# Patient Record
Sex: Female | Born: 1980
Health system: Southern US, Community
[De-identification: ages and names within clinical notes are randomized; demographics above are authoritative.]

## PROBLEM LIST (undated history)

## (undated) ENCOUNTER — Emergency Department (HOSPITAL_COMMUNITY): Admission: EM | Source: Home / Self Care

## (undated) DIAGNOSIS — R011 Cardiac murmur, unspecified: Secondary | ICD-10-CM

## (undated) DIAGNOSIS — Z01419 Encounter for gynecological examination (general) (routine) without abnormal findings: Secondary | ICD-10-CM

## (undated) DIAGNOSIS — G43909 Migraine, unspecified, not intractable, without status migrainosus: Secondary | ICD-10-CM

## (undated) DIAGNOSIS — E119 Type 2 diabetes mellitus without complications: Secondary | ICD-10-CM

## (undated) DIAGNOSIS — F909 Attention-deficit hyperactivity disorder, unspecified type: Secondary | ICD-10-CM

## (undated) DIAGNOSIS — K219 Gastro-esophageal reflux disease without esophagitis: Secondary | ICD-10-CM

## (undated) DIAGNOSIS — T7840XA Allergy, unspecified, initial encounter: Secondary | ICD-10-CM

## (undated) DIAGNOSIS — D649 Anemia, unspecified: Secondary | ICD-10-CM

## (undated) DIAGNOSIS — R0602 Shortness of breath: Secondary | ICD-10-CM

## (undated) DIAGNOSIS — F32A Depression, unspecified: Secondary | ICD-10-CM

## (undated) DIAGNOSIS — M2550 Pain in unspecified joint: Secondary | ICD-10-CM

## (undated) DIAGNOSIS — Z87442 Personal history of urinary calculi: Secondary | ICD-10-CM

## (undated) DIAGNOSIS — M549 Dorsalgia, unspecified: Secondary | ICD-10-CM

## (undated) DIAGNOSIS — K760 Fatty (change of) liver, not elsewhere classified: Secondary | ICD-10-CM

## (undated) DIAGNOSIS — R5383 Other fatigue: Secondary | ICD-10-CM

## (undated) DIAGNOSIS — F419 Anxiety disorder, unspecified: Secondary | ICD-10-CM

## (undated) DIAGNOSIS — F329 Major depressive disorder, single episode, unspecified: Secondary | ICD-10-CM

## (undated) HISTORY — DX: Migraine, unspecified, not intractable, without status migrainosus: G43.909

## (undated) HISTORY — DX: Other fatigue: R53.83

## (undated) HISTORY — DX: Encounter for gynecological examination (general) (routine) without abnormal findings: Z01.419

## (undated) HISTORY — DX: Allergy, unspecified, initial encounter: T78.40XA

## (undated) HISTORY — DX: Cardiac murmur, unspecified: R01.1

## (undated) HISTORY — DX: Attention-deficit hyperactivity disorder, unspecified type: F90.9

## (undated) HISTORY — DX: Fatty (change of) liver, not elsewhere classified: K76.0

## (undated) HISTORY — DX: Gastro-esophageal reflux disease without esophagitis: K21.9

## (undated) HISTORY — DX: Depression, unspecified: F32.A

## (undated) HISTORY — DX: Shortness of breath: R06.02

## (undated) HISTORY — DX: Anxiety disorder, unspecified: F41.9

## (undated) HISTORY — DX: Major depressive disorder, single episode, unspecified: F32.9

## (undated) HISTORY — DX: Pain in unspecified joint: M25.50

## (undated) HISTORY — DX: Dorsalgia, unspecified: M54.9

---
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
## 2001-04-14 ENCOUNTER — Other Ambulatory Visit: Admission: RE | Admit: 2001-04-14 | Discharge: 2001-04-14 | Payer: Self-pay | Admitting: Obstetrics & Gynecology

## 2002-08-29 ENCOUNTER — Other Ambulatory Visit: Admission: RE | Admit: 2002-08-29 | Discharge: 2002-08-29 | Payer: Self-pay | Admitting: Obstetrics & Gynecology

## 2003-08-30 ENCOUNTER — Other Ambulatory Visit: Admission: RE | Admit: 2003-08-30 | Discharge: 2003-08-30 | Payer: Self-pay | Admitting: Obstetrics & Gynecology

## 2004-07-13 ENCOUNTER — Other Ambulatory Visit: Admission: RE | Admit: 2004-07-13 | Discharge: 2004-07-13 | Payer: Self-pay | Admitting: Obstetrics & Gynecology

## 2004-07-14 ENCOUNTER — Other Ambulatory Visit: Admission: RE | Admit: 2004-07-14 | Discharge: 2004-07-14 | Payer: Self-pay | Admitting: Obstetrics & Gynecology

## 2005-01-13 ENCOUNTER — Inpatient Hospital Stay (HOSPITAL_COMMUNITY): Admission: AD | Admit: 2005-01-13 | Discharge: 2005-01-13 | Payer: Self-pay | Admitting: Obstetrics and Gynecology

## 2005-01-14 ENCOUNTER — Inpatient Hospital Stay (HOSPITAL_COMMUNITY): Admission: AD | Admit: 2005-01-14 | Discharge: 2005-01-14 | Payer: Self-pay | Admitting: Obstetrics & Gynecology

## 2005-03-02 ENCOUNTER — Inpatient Hospital Stay (HOSPITAL_COMMUNITY): Admission: AD | Admit: 2005-03-02 | Discharge: 2005-03-06 | Payer: Self-pay | Admitting: Obstetrics & Gynecology

## 2005-11-24 ENCOUNTER — Emergency Department (HOSPITAL_COMMUNITY): Admission: EM | Admit: 2005-11-24 | Discharge: 2005-11-24 | Payer: Self-pay | Admitting: Family Medicine

## 2006-05-24 ENCOUNTER — Inpatient Hospital Stay (HOSPITAL_COMMUNITY): Admission: AD | Admit: 2006-05-24 | Discharge: 2006-05-24 | Payer: Self-pay | Admitting: Obstetrics and Gynecology

## 2006-05-27 ENCOUNTER — Inpatient Hospital Stay (HOSPITAL_COMMUNITY): Admission: AD | Admit: 2006-05-27 | Discharge: 2006-05-27 | Payer: Self-pay | Admitting: Obstetrics and Gynecology

## 2006-06-28 ENCOUNTER — Ambulatory Visit (HOSPITAL_COMMUNITY): Admission: RE | Admit: 2006-06-28 | Discharge: 2006-06-28 | Payer: Self-pay | Admitting: Obstetrics & Gynecology

## 2006-10-25 ENCOUNTER — Encounter: Admission: RE | Admit: 2006-10-25 | Discharge: 2006-10-25 | Payer: Self-pay | Admitting: Obstetrics and Gynecology

## 2006-11-24 ENCOUNTER — Inpatient Hospital Stay (HOSPITAL_COMMUNITY): Admission: AD | Admit: 2006-11-24 | Discharge: 2006-11-24 | Payer: Self-pay | Admitting: Obstetrics and Gynecology

## 2006-12-13 ENCOUNTER — Inpatient Hospital Stay (HOSPITAL_COMMUNITY): Admission: RE | Admit: 2006-12-13 | Discharge: 2006-12-16 | Payer: Self-pay | Admitting: Obstetrics and Gynecology

## 2006-12-13 ENCOUNTER — Encounter (INDEPENDENT_AMBULATORY_CARE_PROVIDER_SITE_OTHER): Payer: Self-pay | Admitting: Obstetrics and Gynecology

## 2008-04-26 ENCOUNTER — Emergency Department: Payer: Self-pay | Admitting: Emergency Medicine

## 2010-08-25 NOTE — Op Note (Signed)
NAMEALICIANNA, LITCHFORD             ACCOUNT NO.:  [id number]   MEDICAL RECORD NO.:  [id number]          PATIENT TYPE:  INP   LOCATION:  9122                          FACILITY:  [facility]   PHYSICIAN:  Lenard Galloway, M.D.   DATE OF BIRTH:  04-Jan-1981   DATE OF PROCEDURE:  DATE OF DISCHARGE:                               OPERATIVE REPORT   PREOPERATIVE DIAGNOSIS:  1. Intrauterine gestation at 38 +5 weeks.  2. Class A2 gestational diabetes mellitus.  3. Suspected fetal macrosomia.   POSTOPERATIVE DIAGNOSIS:  1. Intrauterine gestation at 38 +5 weeks.  2. Class A2 gestational diabetes mellitus.  3. Fetal macrosomia.   PROCEDURE:  Primary low segment transverse cesarean section.   SURGEON:  Josefa Half, MD   ASSISTANT:  Gus Height, MD   IV FLUIDS:  Twenty-nine hundred mL Ringer lactate.   ESTIMATED BLOOD LOSS:  One thousand mL.   URINE OUTPUT:  Three hundred twenty-five mL.   COMPLICATIONS:  None.   INDICATIONS FOR PROCEDURE:  The patient is a 30 year old gravida 2, para  1-0-0-1 Caucasian female at 80 +5 weeks' gestation by first trimester  ultrasound who has a diagnosis of gestational diabetes which is  controlled with glyburide.  The patient clinically is noted to have a  large fetus on ultrasound performed on August 28, documented an  estimated fetal weight of 3972 grams which is greater than the 97th  percentile.  The amniotic fluid index was 19.37.  The patient has been  having twice weekly fetal testing which has been reassuring.  A plan is  made now to proceed with a primary low segment transverse cesarean  section after risks, benefits, and alternatives are reviewed.   FINDINGS:  A viable female was delivered at 7:49 a.m. with Apgars of 8  at one minute and 9 at five minutes.  The weight was 10 pounds 6 ounces.  The amniotic fluid was clear.  The placenta had a normal insertion of a  three-vessel cord.  The uterus, tubes, and ovaries were unremarkable.   SPECIMENS:  The  placenta was sent to pathology.   PROCEDURE:  The patient was reidentified in the preoperative hold area.  She did receive clindamycin 900 mg IV.  The patient was brought to the  operating room where a spinal anesthetic was administered.  The patient  was placed in the supine position with a left lateral tilt.  The abdomen  was sterilely prepped, and a Foley catheter was sterilely placed inside  the bladder.  She was sterilely draped.   The procedure began with a Pfannenstiel incision which was created  sharply with a scalpel.  This was carried down to the fascia using  monopolar cautery for hemostasis.  The fascia was incised in the  midline, and the incision was extended bilaterally with the Mayo  scissors.  The rectus muscles were dissected off of the fascia  superiorly and inferiorly.  The rectus muscles were then sharply divided  in the midline.  The parietal peritoneum was elevated with hemostat  clamps and was entered sharply.  The peritoneal incision was extended  cranially  and caudally with the Metzenbaum scissors.   The lower uterine segment was exposed with the bladder retractor, and a  bladder flap was sharply created.  A transverse lower uterine segment  incision was then created sharply with the scalpel.  The uterine  incision was extended bluntly.  Membranes were ruptured with an Allis  clamp.  Initially, an arm spontaneously delivered through the incision.  The arm was tucked back inside the uterine cavity, and the fetal vertex  was then elevated and delivered without difficulty followed by the  remainder of the infant.  The nares and mouth were suctioned with the  bulb syringe.  The cord was doubly clamped and cut.  The newborn was  carried over to the waiting pediatricians in vigorous condition.   Cord blood was obtained, and the placenta was then manually extracted  and sent to pathology.  The uterus was exteriorized at this time.  It  was wiped clean with a  moistened lap pad.  It was closed with a double  layer closure of No. 1 chromic.  The first was a running locked layer,  and the second was an imbricating layer.   The uterus was then returned to the peritoneal cavity which was  irrigated and suctioned.  The uterine incision was noted to be  hemostatic.   The abdomen was closed.  The parietal peritoneum was closed with a  running suture of 3-0 Vicryl.  The rectus muscles were re-approximated  in the midline with interrupted sutures of No. 1 chromic.  The fascia  was closed with a running suture of 0 Vicryl.  The subcutaneous layer  was irrigated and suctioned and made hemostatic with monopolar cautery.  The skin was closed with staples, and a pressure bandage was placed over  this.   This concluded the patient's procedure.  There were no complications.  All needle, instrument, sponge counts were correct.      Lenard Galloway, M.D.  Electronically Signed     BES/MEDQ  D:  12/13/2006  T:  12/13/2006  Job:  [id number]

## 2010-08-25 NOTE — Discharge Summary (Signed)
Laurie Sutton, Laurie Sutton             ACCOUNT NO.:  [id number]   MEDICAL RECORD NO.:  [id number]          PATIENT TYPE:  INP   LOCATION:  9122                          FACILITY:  [facility]   PHYSICIAN:  Cristopher Estimable. Stann Mainland, M.D.   DATE OF BIRTH:  May 10, 1980   DATE OF ADMISSION:  12/13/2006  DATE OF DISCHARGE:  12/16/2006                               DISCHARGE SUMMARY   DISCHARGE DIAGNOSIS:  1. Term pregnancy delivered 10 pound 6 ounce female infant, Apgars 8      and 9.  2. Blood type O+.  3. Suspected macrosomia.  4. Gestational diabetes mellitus.   PROCEDURE:  Primary low-transverse cesarean section.   SUMMARY:  This 30 year old gravida 2, now para 2, was admitted at 63-[redacted]  weeks gestation for primary low-transverse cesarean section because of  suspected macrosomia.  She had gestational diabetes during pregnancy,  managed with oral agents.  As she neared-term macrosomia was suspected;  and after discussion with the patient decision was made to proceed with  primary low-transverse cesarean section for delivery which was carried  out on September 2 with delivery of a 10 pound 6 ounce female infant  with good Apgars.   The patient's postpartum course was complicated by some slight blood  pressure elevations which were controlled with Procardia 30 mg XL daily  which the patient also went home on.  On the morning of September 5 she  was ambulating well, tolerating a regular diet well, having normal bowel  and bladder function; and was afebrile.  Her wound was clean and dry.  Her fundus was firm; bleeding was minimal; and she was deemed ready for  discharge.  Accordingly, her staples were removed and her wound was  Steri-Stripped with Benzoin.  She was given all appropriate  instructions, and discharged to home.  Discharge hemoglobin on September  3 was 11.6 with a white count of 10,700 and a platelet count of 251,000.   MEDICATIONS AT THE TIME OF DISCHARGE INCLUDE:  1. Vitamins one a day as  long she is breast-feeding.  2. Procardia at 30 mg XL daily for blood pressure.  3. Motrin 600 mg every 6 hours as needed for cramping or pain.  4. Tylox one to two q.4-6 h. as needed for more severe pain.   She will return the office for followup in approximately four weeks'  time or as needed.   CONDITION ON DISCHARGE:  Improved.      Cristopher Estimable. Stann Mainland, M.D.  Electronically Signed    RMW/MEDQ  D:  12/16/2006  T:  12/16/2006  Job:  [id number]

## 2011-01-22 LAB — CBC
Hemoglobin: 13.2
MCHC: 35.6
MCV: 90.5
MCV: 89.8
Platelets: 251
Platelets: 257
RBC: 4.1
RDW: 13.7
RDW: 13.9
WBC: 8.6

## 2011-01-22 LAB — BASIC METABOLIC PANEL
BUN: 3 — ABNORMAL LOW
Calcium: 8.6
Creatinine, Ser: 0.5
GFR calc Af Amer: >60
Glucose, Bld: 228 — ABNORMAL HIGH
Sodium: 133 — ABNORMAL LOW

## 2011-01-22 LAB — COMPREHENSIVE METABOLIC PANEL
AST: 20
Alkaline Phosphatase: 99
GFR calc Af Amer: >60
GFR calc non Af Amer: >60
Total Bilirubin: 0.4

## 2011-01-22 LAB — URIC ACID: Uric Acid, Serum: 4.9

## 2011-01-22 LAB — RPR: RPR Ser Ql: NONREACTIVE

## 2011-02-16 ENCOUNTER — Ambulatory Visit (INDEPENDENT_AMBULATORY_CARE_PROVIDER_SITE_OTHER): Payer: BC Managed Care – PPO | Admitting: Family Medicine

## 2011-02-16 ENCOUNTER — Encounter: Payer: Self-pay | Admitting: Family Medicine

## 2011-02-16 VITALS — BP 124/80 | HR 74 | Temp 98.2°F | Ht 67.0 in | Wt 216.0 lb

## 2011-02-16 DIAGNOSIS — F341 Dysthymic disorder: Secondary | ICD-10-CM

## 2011-02-16 DIAGNOSIS — F32A Depression, unspecified: Secondary | ICD-10-CM | POA: Insufficient documentation

## 2011-02-16 DIAGNOSIS — F418 Other specified anxiety disorders: Secondary | ICD-10-CM

## 2011-02-16 DIAGNOSIS — G43909 Migraine, unspecified, not intractable, without status migrainosus: Secondary | ICD-10-CM | POA: Insufficient documentation

## 2011-02-16 MED ORDER — FLUOXETINE HCL 40 MG PO CAPS: 40.0000 mg | ORAL_CAPSULE | Freq: Every day | ORAL | Status: DC

## 2011-02-16 MED ORDER — SUMATRIPTAN SUCCINATE 100 MG PO TABS: 100.0000 mg | ORAL_TABLET | Freq: Once | ORAL | Status: DC | PRN

## 2011-02-16 MED ORDER — ALPRAZOLAM 1 MG PO TABS: 1.0000 mg | ORAL_TABLET | Freq: Every evening | ORAL | Status: DC | PRN

## 2011-02-16 NOTE — Progress Notes (Signed)
  Subjective:    Patient ID: Laurie Sutton, female    DOB: 06-10-1980, 30 y.o.   MRN: [id number]  HPI 30 yr old female to establish with [name] after seeing Urgent Care for a number of years. Her main issues to discuss today are her headaches and her depression with anxiety. She started having migraines as a teenager, with the typical throbbing HAs, nausea, and light sensitivity. These went away in her 27s, but now they have returned in the past 6 months. She is averaging 3-4 HAs a week. She usually take some Excedrin and goes to bed for a few hours. She drinks 4-5 caffeinated sodas every day. She is a homemaker, and she she has 2 kids ages 18 and 62 years. She is under a lot of stress as well. She has tried Paxil and Wellbutrin in the past with poor results. She has constant anxiety, worries about things, has a shoert temper, feels sad and hopeless, and she has trouble sleeping.    Review of Systems  Constitutional: Negative.   Respiratory: Negative.   Cardiovascular: Negative.   Neurological: Positive for headaches.  Psychiatric/Behavioral: Positive for sleep disturbance, dysphoric mood and decreased concentration. The patient is nervous/anxious.        Objective:   Physical Exam  Constitutional: She appears well-developed and well-nourished.  Neck: No thyromegaly present.  Cardiovascular: Normal rate, regular rhythm, normal heart sounds and intact distal pulses.   Pulmonary/Chest: Effort normal and breath sounds normal. No respiratory distress. She has no wheezes. She has no rales. She exhibits no tenderness.  Lymphadenopathy:    She has no cervical adenopathy.  Psychiatric: She has a normal mood and affect. Her behavior is normal. Thought content normal.          Assessment & Plan:  For the depression, try Prozac. For the insomnia, try Xanax at bedtime. For the migraines, try Sumatriptan, and I advised her to cut back dramatically on her caffeine use.

## 2011-03-01 ENCOUNTER — Ambulatory Visit (INDEPENDENT_AMBULATORY_CARE_PROVIDER_SITE_OTHER): Payer: BC Managed Care – PPO | Admitting: Family Medicine

## 2011-03-01 ENCOUNTER — Encounter: Payer: Self-pay | Admitting: Family Medicine

## 2011-03-01 VITALS — BP 124/80 | HR 87 | Temp 98.4°F | Wt 218.0 lb

## 2011-03-01 DIAGNOSIS — G43909 Migraine, unspecified, not intractable, without status migrainosus: Secondary | ICD-10-CM

## 2011-03-01 DIAGNOSIS — G4700 Insomnia, unspecified: Secondary | ICD-10-CM

## 2011-03-01 DIAGNOSIS — F341 Dysthymic disorder: Secondary | ICD-10-CM

## 2011-03-01 DIAGNOSIS — F418 Other specified anxiety disorders: Secondary | ICD-10-CM

## 2011-03-01 MED ORDER — TEMAZEPAM 30 MG PO CAPS: 30.0000 mg | ORAL_CAPSULE | Freq: Every evening | ORAL | Status: DC | PRN

## 2011-03-01 MED ORDER — VENLAFAXINE HCL 75 MG PO TABS: 75.0000 mg | ORAL_TABLET | Freq: Two times a day (BID) | ORAL | Status: DC

## 2011-03-01 MED ORDER — ALPRAZOLAM 1 MG PO TABS: 1.0000 mg | ORAL_TABLET | Freq: Three times a day (TID) | ORAL | Status: AC | PRN

## 2011-03-01 NOTE — Progress Notes (Signed)
  Subjective:    Patient ID: Laurie Sutton, female    DOB: Sep 27, 1980, 30 y.o.   MRN: [id number]  HPI Here to follow up on depression with anxiety, insomnia, and migraines. At our last visit she started on Prozac, Xanax, and Sumatriptan. She did not like how the Prozac made her feel, so she went back on Effexor. The Sumatriptan definitely helps the HAs. The Xanax helps her relax during the day, but it does not help with sleep at all.    Review of Systems  Constitutional: Negative.   Neurological: Positive for headaches.  Psychiatric/Behavioral: Positive for sleep disturbance. The patient is nervous/anxious.        Objective:   Physical Exam  Constitutional: She is oriented to person, place, and time. She appears well-developed and well-nourished.  Neurological: She is alert and oriented to person, place, and time. No cranial nerve deficit.  Psychiatric: She has a normal mood and affect. Her behavior is normal. Thought content normal.          Assessment & Plan:  Stay on Effexor but increase the dose to 75 mg bid. Use Xanax for anxiety, but try Temazepam at bedtime for sleep.

## 2011-03-12 ENCOUNTER — Other Ambulatory Visit: Payer: Self-pay | Admitting: Family Medicine

## 2011-03-12 MED ORDER — SUMATRIPTAN SUCCINATE 100 MG PO TABS: 100.0000 mg | ORAL_TABLET | Freq: Once | ORAL | Status: DC | PRN

## 2011-03-12 NOTE — Telephone Encounter (Signed)
Pt is requesting more generic imitrex med call into [facility] [address]. Pt was given 9 pills on 02-16-11.

## 2011-03-12 NOTE — Telephone Encounter (Signed)
rx called into pharmacy

## 2011-03-12 NOTE — Telephone Encounter (Signed)
Call in #12 with 11 rf

## 2011-03-16 ENCOUNTER — Telehealth: Payer: Self-pay | Admitting: Family Medicine

## 2011-03-16 NOTE — Telephone Encounter (Signed)
Wants a quantity increase of her migraine meds. Please advise. Call her at [phone].        [facility]

## 2011-03-17 NOTE — Telephone Encounter (Signed)
I assume she means the Imitrex. This is usually limited by her insurance company. She should contact them to see how much they will approve per month

## 2011-03-19 NOTE — Telephone Encounter (Signed)
Spoke with pt

## 2011-05-07 ENCOUNTER — Encounter: Payer: Self-pay | Admitting: Family Medicine

## 2011-05-07 ENCOUNTER — Ambulatory Visit (INDEPENDENT_AMBULATORY_CARE_PROVIDER_SITE_OTHER): Payer: BC Managed Care – PPO | Admitting: Family Medicine

## 2011-05-07 VITALS — BP 116/74 | HR 83 | Temp 98.5°F | Wt 213.0 lb

## 2011-05-07 DIAGNOSIS — J329 Chronic sinusitis, unspecified: Secondary | ICD-10-CM

## 2011-05-07 MED ORDER — AZITHROMYCIN 250 MG PO TABS: ORAL_TABLET | ORAL | Status: DC

## 2011-05-07 NOTE — Progress Notes (Signed)
  Subjective:    Patient ID: Laurie Sutton, female    DOB: January 20, 1981, 31 y.o.   MRN: [id number]  HPI Here for 3 days of sinus pressure, HA, ST, and a dry cough. No fever. On Mucinex and Advil.    Review of Systems  Constitutional: Negative.   HENT: Positive for congestion, postnasal drip and sinus pressure.   Eyes: Negative.   Respiratory: Positive for cough.        Objective:   Physical Exam  Constitutional: She appears well-developed and well-nourished.  HENT:  Right Ear: External ear normal.  Left Ear: External ear normal.  Nose: Nose normal.  Mouth/Throat: Oropharynx is clear and moist. No oropharyngeal exudate.  Eyes: Conjunctivae are normal.  Pulmonary/Chest: Effort normal and breath sounds normal.  Lymphadenopathy:    She has no cervical adenopathy.          Assessment & Plan:  Recheck prn

## 2011-05-12 ENCOUNTER — Encounter: Payer: Self-pay | Admitting: Family Medicine

## 2011-05-12 ENCOUNTER — Ambulatory Visit (INDEPENDENT_AMBULATORY_CARE_PROVIDER_SITE_OTHER): Payer: BC Managed Care – PPO | Admitting: Family Medicine

## 2011-05-12 VITALS — BP 116/70 | HR 110 | Temp 98.6°F | Wt 213.0 lb

## 2011-05-12 DIAGNOSIS — J329 Chronic sinusitis, unspecified: Secondary | ICD-10-CM

## 2011-05-12 MED ORDER — METHYLPREDNISOLONE ACETATE 80 MG/ML IJ SUSP
120.0000 mg | Freq: Once | INTRAMUSCULAR | Status: AC
  Administered 2011-05-12: 120 mg via INTRAMUSCULAR

## 2011-05-12 MED ORDER — LEVOFLOXACIN 500 MG PO TABS: 500.0000 mg | ORAL_TABLET | Freq: Every day | ORAL | Status: AC

## 2011-05-12 NOTE — Progress Notes (Signed)
Addended by: Aggie Hacker A on: 05/12/2011 12:16 PM   Modules accepted: Orders

## 2011-05-12 NOTE — Progress Notes (Signed)
  Subjective:    Patient ID: Laurie Sutton, female    DOB: [birth year], 31 y.o.   MRN: [id number]  HPI Here for worsening pressure and pain in the right cheek and forehead, tearing in the right eye, ST, and dry cough. She just finished her Zpack yesterday, but she is not improving. Using Mucinex and Sudafed.    Review of Systems  Constitutional: Negative.   HENT: Positive for congestion, rhinorrhea and postnasal drip.   Eyes: Negative.   Respiratory: Positive for cough.        Objective:   Physical Exam  Constitutional: She appears well-developed and well-nourished.  HENT:  Right Ear: External ear normal.  Left Ear: External ear normal.  Nose: Nose normal.  Mouth/Throat: Oropharynx is clear and moist. No oropharyngeal exudate.  Eyes: Conjunctivae are normal.  Neck: Neck supple.  Pulmonary/Chest: Effort normal and breath sounds normal. No respiratory distress. She has no wheezes. She has no rales.  Lymphadenopathy:    She has no cervical adenopathy.          Assessment & Plan:  Recheck prn

## 2011-07-07 ENCOUNTER — Telehealth: Payer: Self-pay | Admitting: Family Medicine

## 2011-07-07 NOTE — Telephone Encounter (Signed)
Pt called and is having lower abdominal pain near genital area. Burning and stinging when urinating. Also having diarrhea. Pt is not running a fever. Pt is req work in ov with Dr Sarajane Jews asap.

## 2011-07-07 NOTE — Telephone Encounter (Signed)
Appt scheduled tomorrow with Dr. Sarajane Jews.

## 2011-07-08 ENCOUNTER — Encounter: Payer: Self-pay | Admitting: Family Medicine

## 2011-07-08 ENCOUNTER — Ambulatory Visit (INDEPENDENT_AMBULATORY_CARE_PROVIDER_SITE_OTHER): Payer: BC Managed Care – PPO | Admitting: Family Medicine

## 2011-07-08 VITALS — BP 104/66 | HR 88 | Temp 97.9°F | Wt 209.0 lb

## 2011-07-08 DIAGNOSIS — G43909 Migraine, unspecified, not intractable, without status migrainosus: Secondary | ICD-10-CM

## 2011-07-08 DIAGNOSIS — G43109 Migraine with aura, not intractable, without status migrainosus: Secondary | ICD-10-CM

## 2011-07-08 DIAGNOSIS — R300 Dysuria: Secondary | ICD-10-CM

## 2011-07-08 DIAGNOSIS — N390 Urinary tract infection, site not specified: Secondary | ICD-10-CM

## 2011-07-08 LAB — POCT URINALYSIS DIPSTICK
Nitrite, UA: NEGATIVE
Urobilinogen, UA: 0.2

## 2011-07-08 MED ORDER — TOPIRAMATE 25 MG PO TABS: 25.0000 mg | ORAL_TABLET | Freq: Every day | ORAL | Status: DC

## 2011-07-08 MED ORDER — CIPROFLOXACIN HCL 500 MG PO TABS: 500.0000 mg | ORAL_TABLET | Freq: Two times a day (BID) | ORAL | Status: AC

## 2011-07-08 NOTE — Progress Notes (Signed)
  Subjective:    Patient ID: Laurie Sutton, female    DOB: 13-Dec-1980, 31 y.o.   MRN: [id number]  HPI Here for 2 reasons. First she has had urinary frequency and burning for 3 days. No nausea or fever. Also her migraines have become much more frequent. Lately she gets them about 5 days out of 7. Sometimes Imitrex relieves and sometimes not. They are still centered around her forehead. She has an eye exam coming up next week.    Review of Systems  Constitutional: Negative.   Genitourinary: Positive for dysuria, urgency and frequency. Negative for hematuria.  Neurological: Positive for headaches.       Objective:   Physical Exam  Constitutional: She is oriented to person, place, and time. She appears well-developed and well-nourished.  Abdominal: Soft. Bowel sounds are normal. She exhibits no distension and no mass. There is no tenderness. There is no rebound and no guarding.  Neurological: She is alert and oriented to person, place, and time. No cranial nerve deficit.          Assessment & Plan:  We will try a preventive strategy for her HAs by taking Topamax daily. She will get back with [name] about this in 2 weeks. Drink water. Use Cipro.

## 2011-07-16 ENCOUNTER — Ambulatory Visit: Payer: BC Managed Care – PPO | Admitting: Family

## 2011-07-19 ENCOUNTER — Encounter: Payer: Self-pay | Admitting: Family Medicine

## 2011-07-19 ENCOUNTER — Ambulatory Visit: Payer: BC Managed Care – PPO | Admitting: Family Medicine

## 2011-07-19 ENCOUNTER — Ambulatory Visit (INDEPENDENT_AMBULATORY_CARE_PROVIDER_SITE_OTHER): Payer: BC Managed Care – PPO | Admitting: Family Medicine

## 2011-07-19 VITALS — BP 112/76 | HR 87 | Temp 97.2°F | Wt 212.0 lb

## 2011-07-19 DIAGNOSIS — G43909 Migraine, unspecified, not intractable, without status migrainosus: Secondary | ICD-10-CM

## 2011-07-19 DIAGNOSIS — N390 Urinary tract infection, site not specified: Secondary | ICD-10-CM

## 2011-07-19 MED ORDER — NITROFURANTOIN MONOHYD MACRO 100 MG PO CAPS: 100.0000 mg | ORAL_CAPSULE | Freq: Two times a day (BID) | ORAL | Status: AC

## 2011-07-19 MED ORDER — TOPIRAMATE 50 MG PO TABS: 50.0000 mg | ORAL_TABLET | Freq: Every day | ORAL | Status: DC

## 2011-07-19 NOTE — Progress Notes (Signed)
  Subjective:    Patient ID: Laurie Sutton, female    DOB: [birth year], 31 y.o.   MRN: [id number]  HPI Here for recurrent urinary burning and urgency, and for migraines. She was here several weeks ago and was given Cipro. This seemed to work but her urinary symptoms returned a few days ago. No fever. Also she started on Topamax 25 mg daily, and this helped her HAs quite a bit for a week or so. Now they are coming back.    Review of Systems  Constitutional: Negative.   Gastrointestinal: Negative.   Genitourinary: Positive for dysuria, urgency and frequency. Negative for flank pain and difficulty urinating.  Neurological: Positive for headaches.       Objective:   Physical Exam  Constitutional: She appears well-developed and well-nourished.  Abdominal: Soft. Bowel sounds are normal. She exhibits no distension and no mass. There is no tenderness. There is no rebound and no guarding.          Assessment & Plan:  Try Macrobid for 7 days. We will culture the urine. Increase Topamax to 50 mg a day.

## 2011-07-21 LAB — URINE CULTURE: Colony Count: 9000

## 2011-07-22 NOTE — Progress Notes (Signed)
Quick Note:  Left voice message ______

## 2011-07-27 ENCOUNTER — Telehealth: Payer: Self-pay | Admitting: Family Medicine

## 2011-07-27 NOTE — Telephone Encounter (Signed)
Pt called and said that the abxs that she has been on, has given her a yeast inf, so pt is req med to help with the yeast inf. [facility] [address]

## 2011-07-28 ENCOUNTER — Telehealth: Payer: Self-pay | Admitting: Family Medicine

## 2011-07-28 MED ORDER — FLUCONAZOLE 150 MG PO TABS: 150.0000 mg | ORAL_TABLET | Freq: Once | ORAL | Status: AC

## 2011-07-28 NOTE — Telephone Encounter (Signed)
Call in Diflucan 150 mg prn, #1 with 5 rf

## 2011-07-28 NOTE — Telephone Encounter (Signed)
Pt left a voice message and is requesting something for a possible yeast infection? She has been on a couple of antibiotics and now having some vaginal itching.

## 2011-07-28 NOTE — Telephone Encounter (Signed)
Script sent e-scribe and spoke with pt.

## 2011-09-09 ENCOUNTER — Telehealth: Payer: Self-pay | Admitting: Family Medicine

## 2011-09-09 NOTE — Telephone Encounter (Signed)
Pt requesting to up dose on topiramate (TOPAMAX) 50 MG tablet Pt stating she is still having bad headaches. Please contact

## 2011-09-10 MED ORDER — TOPIRAMATE 50 MG PO TABS: 100.0000 mg | ORAL_TABLET | Freq: Every day | ORAL | Status: DC

## 2011-09-10 NOTE — Telephone Encounter (Signed)
Left a message for patient to return call. RX called into pharmacy

## 2011-09-10 NOTE — Telephone Encounter (Signed)
Increase the dose to 100 mg a day. Call in a 6 month supply

## 2011-09-15 ENCOUNTER — Encounter: Payer: Self-pay | Admitting: Family Medicine

## 2011-09-15 ENCOUNTER — Ambulatory Visit (INDEPENDENT_AMBULATORY_CARE_PROVIDER_SITE_OTHER): Payer: BC Managed Care – PPO | Admitting: Family Medicine

## 2011-09-15 VITALS — BP 110/70 | HR 93 | Temp 97.6°F | Wt 206.0 lb

## 2011-09-15 DIAGNOSIS — Z9109 Other allergy status, other than to drugs and biological substances: Secondary | ICD-10-CM

## 2011-09-15 DIAGNOSIS — R51 Headache: Secondary | ICD-10-CM

## 2011-09-15 DIAGNOSIS — J309 Allergic rhinitis, unspecified: Secondary | ICD-10-CM

## 2011-09-15 MED ORDER — METHYLPREDNISOLONE ACETATE 80 MG/ML IJ SUSP
120.0000 mg | Freq: Once | INTRAMUSCULAR | Status: AC
  Administered 2011-09-15: 120 mg via INTRAMUSCULAR

## 2011-09-15 NOTE — Progress Notes (Signed)
Addended by: Aggie Hacker A on: 09/15/2011 05:10 PM   Modules accepted: Orders

## 2011-09-15 NOTE — Progress Notes (Signed)
  Subjective:    Patient ID: Laurie Sutton, female    DOB: 1980/05/03, 31 y.o.   MRN: [id number]  HPI Here for 3 days of sinus pressure, ear pressure, HAs, and sneezing. No fever or ST or cough. She has taken Imitrex with only partial relief from the HA. On Claritin.    Review of Systems  Constitutional: Negative.   HENT: Positive for congestion, postnasal drip and sinus pressure.   Eyes: Negative.   Respiratory: Negative.   Neurological: Positive for headaches.       Objective:   Physical Exam  Constitutional: She appears well-developed and well-nourished.  HENT:  Right Ear: External ear normal.  Left Ear: External ear normal.  Nose: Nose normal.  Mouth/Throat: Oropharynx is clear and moist.  Eyes: Conjunctivae are normal. Pupils are equal, round, and reactive to light.  Neck: No thyromegaly present.  Pulmonary/Chest: Effort normal and breath sounds normal.  Lymphadenopathy:    She has no cervical adenopathy.          Assessment & Plan:  She has some sinus congestion from allergies that has triggered migraines. Try Mucinex D prn. Given a steroid shot.

## 2011-10-20 ENCOUNTER — Encounter: Payer: Self-pay | Admitting: Family Medicine

## 2011-10-20 ENCOUNTER — Ambulatory Visit (INDEPENDENT_AMBULATORY_CARE_PROVIDER_SITE_OTHER): Payer: BC Managed Care – PPO | Admitting: Family Medicine

## 2011-10-20 VITALS — BP 118/70 | HR 100 | Temp 98.4°F | Wt 213.0 lb

## 2011-10-20 DIAGNOSIS — R51 Headache: Secondary | ICD-10-CM

## 2011-10-20 DIAGNOSIS — N390 Urinary tract infection, site not specified: Secondary | ICD-10-CM

## 2011-10-20 LAB — POCT URINALYSIS DIPSTICK
Bilirubin, UA: NEGATIVE
Blood, UA: NEGATIVE
Nitrite, UA: NEGATIVE
pH, UA: 6.5

## 2011-10-20 MED ORDER — CIPROFLOXACIN HCL 500 MG PO TABS: 500.0000 mg | ORAL_TABLET | Freq: Two times a day (BID) | ORAL | Status: AC

## 2011-10-20 NOTE — Addendum Note (Signed)
Addended by: Aggie Hacker A on: 10/20/2011 03:41 PM   Modules accepted: Orders

## 2011-10-20 NOTE — Progress Notes (Signed)
  Subjective:    Patient ID: Laurie Sutton, female    DOB: 01-11-1981, 31 y.o.   MRN: [id number]  HPI Here to follow up on HAs and a UTI. We saw her 5 weeks ago for her HAs and gave her a steroid shot. This did not help at all. She still has daily HAs, mostly around the eyes or the top  of the head. No vomiting. She has never been imaged as yet. Also she saw Urgent Care on 10-10-11 for a UTI and was given Bactrim DS and Pyridium. She took this for 6 days and stopped because she had no improvement. She still has urgency and burning. No fever.    Review of Systems  Constitutional: Negative.   Genitourinary: Positive for dysuria, urgency and frequency.  Neurological: Positive for headaches.       Objective:   Physical Exam  Constitutional: She is oriented to person, place, and time. She appears well-developed and well-nourished.  HENT:  Head: Normocephalic and atraumatic.  Right Ear: External ear normal.  Left Ear: External ear normal.  Nose: Nose normal.  Mouth/Throat: Oropharynx is clear and moist.  Eyes: Conjunctivae and EOM are normal. Pupils are equal, round, and reactive to light.  Neck: Neck supple. No thyromegaly present.  Abdominal: Soft. Bowel sounds are normal. She exhibits no distension and no mass. There is no tenderness. There is no rebound and no guarding.  Lymphadenopathy:    She has no cervical adenopathy.  Neurological: She is alert and oriented to person, place, and time. No cranial nerve deficit.          Assessment & Plan:  Treat the UTI with Cipro, and we will culture the urine. Get a head CT to investigate the HAs.

## 2011-10-21 ENCOUNTER — Ambulatory Visit (INDEPENDENT_AMBULATORY_CARE_PROVIDER_SITE_OTHER)
Admission: RE | Admit: 2011-10-21 | Discharge: 2011-10-21 | Disposition: A | Discharge status: Home / Self Care | Payer: BC Managed Care – PPO | Source: Ambulatory Visit | Attending: Family Medicine | Admitting: Family Medicine

## 2011-10-21 DIAGNOSIS — R51 Headache: Secondary | ICD-10-CM

## 2011-10-22 LAB — URINE CULTURE: Colony Count: >=100000

## 2011-10-22 NOTE — Progress Notes (Signed)
Quick Note:  Pt wants to know what the next step is, since the results did come back normal. ______

## 2011-10-22 NOTE — Progress Notes (Signed)
Quick Note:  I spoke with pt and gave results. ______

## 2011-10-24 NOTE — Addendum Note (Signed)
Addended by: Alysia Penna A on: 10/24/2011 10:51 PM   Modules accepted: Orders

## 2011-10-25 NOTE — Progress Notes (Signed)
Quick Note:  I left the below voice message. ______

## 2011-10-25 NOTE — Progress Notes (Signed)
Quick Note:  I left voice message with results. ______

## 2011-11-16 ENCOUNTER — Telehealth: Payer: Self-pay | Admitting: Family Medicine

## 2011-11-16 MED ORDER — TOPIRAMATE 100 MG PO TABS: 100.0000 mg | ORAL_TABLET | Freq: Every day | ORAL | Status: DC

## 2011-11-16 NOTE — Telephone Encounter (Signed)
Pt requested a 90 day supply of Topamax 100 take 1 po qd [facility], which I sent [facility]scribe.

## 2011-11-26 ENCOUNTER — Telehealth: Payer: Self-pay | Admitting: Family Medicine

## 2011-11-26 MED ORDER — AZITHROMYCIN 250 MG PO TABS: ORAL_TABLET | ORAL | Status: AC

## 2011-11-26 NOTE — Telephone Encounter (Signed)
Pt called and said to pls call her on cellphone # [phone], not the home #.

## 2011-11-26 NOTE — Telephone Encounter (Signed)
Call in a Zpack

## 2011-11-26 NOTE — Telephone Encounter (Signed)
attempt to call- Vm - at cell # left - LMTCB if questions - zpack sent to [facility]

## 2011-11-26 NOTE — Telephone Encounter (Signed)
Please advise - no appts with any provider availb.

## 2011-11-26 NOTE — Telephone Encounter (Signed)
Caller: Darnell/Patient; Patient Name: Laurie Sutton; PCP: Laurey Morale.; Best Callback Phone Number: [phone].  LMP 09/2011.  Patient calling today 11/26/11 regarding nasal and chest congestion.  Having trouble breathing in her chest, coughing, ear pain, dizziness, headache.  Afebrile.  Body sore from coughing.  Onset 11/22/11.  Has been taking Mucinex DM all week and Motrin and has not helped.  Emergent symptoms ruled out by Upper Respiratory Infection guidelines with exception of moderate to severe headache unrelieved by nonprescription medication.  No appointments available in EPIC (disposition was Call Provider Immediately), OFFICE PLEASE CALL PATIENT BACK AT ABOVE CALL BACK NUMBER TO LET HER KNOW IF SHE CAN BE WORKED IN FOR APPOINTMENT OR IF SHE NEEDS TO GO TO URGENT CARE.

## 2011-12-16 ENCOUNTER — Telehealth: Payer: Self-pay | Admitting: Family Medicine

## 2011-12-16 MED ORDER — TEMAZEPAM 30 MG PO CAPS: 30.0000 mg | ORAL_CAPSULE | Freq: Every evening | ORAL | Status: DC | PRN

## 2011-12-16 NOTE — Telephone Encounter (Signed)
Refill request for Temazepam 30 mg take 1 po qhs prn and last here on 10/20/11.

## 2011-12-16 NOTE — Telephone Encounter (Signed)
Call in #60 with 2 rf

## 2011-12-16 NOTE — Telephone Encounter (Signed)
I called in script 

## 2012-02-23 ENCOUNTER — Other Ambulatory Visit: Payer: Self-pay | Admitting: Family Medicine

## 2012-02-29 ENCOUNTER — Ambulatory Visit (INDEPENDENT_AMBULATORY_CARE_PROVIDER_SITE_OTHER): Payer: BC Managed Care – PPO | Admitting: Family Medicine

## 2012-02-29 ENCOUNTER — Encounter: Payer: Self-pay | Admitting: Family Medicine

## 2012-02-29 VITALS — BP 120/80 | HR 113 | Temp 97.9°F | Wt 215.0 lb

## 2012-02-29 DIAGNOSIS — N390 Urinary tract infection, site not specified: Secondary | ICD-10-CM

## 2012-02-29 DIAGNOSIS — G4700 Insomnia, unspecified: Secondary | ICD-10-CM

## 2012-02-29 LAB — POCT URINALYSIS DIPSTICK
Spec Grav, UA: >=1.03
Urobilinogen, UA: 0.2

## 2012-02-29 MED ORDER — CEPHALEXIN 500 MG PO CAPS: 500.0000 mg | ORAL_CAPSULE | Freq: Three times a day (TID) | ORAL | Status: AC

## 2012-02-29 MED ORDER — ZOLPIDEM TARTRATE 10 MG PO TABS: 10.0000 mg | ORAL_TABLET | Freq: Every evening | ORAL | Status: DC | PRN

## 2012-02-29 NOTE — Progress Notes (Signed)
  Subjective:    Patient ID: Laurie Sutton, female    DOB: 16-Feb-1981, 31 y.o.   MRN: [id number]  HPI Here for another UTI and fo sleep issues. She was here in April and again in July for UTIs. Each time her culture grew Group B Strep. She took Taft in April and Cipro in July. Each time she seemed to get over it, but then urinary symptoms returned. Now she again has a week of urinary urgency and burning. No fever. Also Temazepam does not help her sleep and she wants to try something else.    Review of Systems  Constitutional: Negative.   Genitourinary: Positive for dysuria, urgency and frequency.       Objective:   Physical Exam  Constitutional: She appears well-developed and well-nourished.  Abdominal: Soft. Bowel sounds are normal. She exhibits no distension and no mass. There is no tenderness. There is no rebound and no guarding.          Assessment & Plan:  Try Keflex for the UTI while we wait for the culture results. Try Zolpidem for sleep.

## 2012-03-02 LAB — URINE CULTURE

## 2012-03-03 NOTE — Progress Notes (Signed)
Quick Note:  I spoke with pt ______ 

## 2012-04-21 ENCOUNTER — Ambulatory Visit (INDEPENDENT_AMBULATORY_CARE_PROVIDER_SITE_OTHER): Payer: BC Managed Care – PPO | Admitting: Family Medicine

## 2012-04-21 ENCOUNTER — Encounter: Payer: Self-pay | Admitting: Family Medicine

## 2012-04-21 VITALS — BP 120/84 | HR 104 | Temp 98.3°F | Wt 212.0 lb

## 2012-04-21 DIAGNOSIS — N390 Urinary tract infection, site not specified: Secondary | ICD-10-CM

## 2012-04-21 DIAGNOSIS — R82998 Other abnormal findings in urine: Secondary | ICD-10-CM

## 2012-04-21 DIAGNOSIS — R8290 Unspecified abnormal findings in urine: Secondary | ICD-10-CM

## 2012-04-21 LAB — POCT URINALYSIS DIPSTICK
Ketones, UA: NEGATIVE
Spec Grav, UA: >=1.03

## 2012-04-21 MED ORDER — LEVOFLOXACIN 500 MG PO TABS: 500.0000 mg | ORAL_TABLET | Freq: Every day | ORAL | Status: AC

## 2012-04-21 NOTE — Progress Notes (Signed)
  Subjective:    Patient ID: Laurie Sutton, female    DOB: 1980-08-05, 32 y.o.   MRN: [id number]  HPI Here for more urinary symptoms including burning and urgency. No nausea or fever. She took Keflex in late November and this appeared to work well while she was on it, then the symptoms returned after it finished. She notes she is over a week late for her menses.    Review of Systems  Constitutional: Negative.   Gastrointestinal: Negative.   Genitourinary: Positive for dysuria, urgency and frequency.       Objective:   Physical Exam  Constitutional: She appears well-developed and well-nourished.  Abdominal: Soft. Bowel sounds are normal. She exhibits no distension and no mass. There is no tenderness. There is no rebound and no guarding.          Assessment & Plan:  Treat with Levaquin. Culture the sample. Refer to Urology

## 2012-04-24 LAB — URINE CULTURE

## 2012-04-26 NOTE — Progress Notes (Signed)
Quick Note:  I left voice message with results ______

## 2012-05-27 ENCOUNTER — Other Ambulatory Visit: Payer: Self-pay

## 2012-08-23 ENCOUNTER — Encounter: Payer: Self-pay | Admitting: Family Medicine

## 2012-08-23 ENCOUNTER — Ambulatory Visit (INDEPENDENT_AMBULATORY_CARE_PROVIDER_SITE_OTHER): Payer: BC Managed Care – PPO | Admitting: Family Medicine

## 2012-08-23 VITALS — BP 120/80 | HR 86 | Temp 98.2°F | Wt 210.0 lb

## 2012-08-23 DIAGNOSIS — R1011 Right upper quadrant pain: Secondary | ICD-10-CM

## 2012-08-23 MED ORDER — OMEPRAZOLE 40 MG PO CPDR: 40.0000 mg | DELAYED_RELEASE_CAPSULE | Freq: Every day | ORAL | Status: DC

## 2012-08-23 NOTE — Progress Notes (Signed)
  Subjective:    Patient ID: Laurie Sutton, female    DOB: Apr 19, 1980, 32 y.o.   MRN: [id number]  HPI Here for 4 days of bad indigestion and heartburn, also nausea without vomiting. She has sharp RUQ pains that come and go. No fever. Six years ago while pregnant she had an US showing a few tiny gall stones.    Review of Systems  Constitutional: Negative.   Respiratory: Negative.   Cardiovascular: Negative.   Gastrointestinal: Positive for nausea and abdominal pain. Negative for vomiting, diarrhea, constipation, blood in stool and abdominal distention.       Objective:   Physical Exam  Constitutional: She appears well-developed and well-nourished.  Cardiovascular: Normal rate, regular rhythm, normal heart sounds and intact distal pulses.   Pulmonary/Chest: Effort normal and breath sounds normal.  Abdominal: Soft. Bowel sounds are normal. She exhibits no distension and no mass. There is no rebound and no guarding.  Tender in the RUQ          Assessment & Plan:  Probable gall bladder disease on top of GERD. Try Omeprazole and Mylanta. Avoid fatty meals. Get an abdominal US tomorrow

## 2012-08-25 ENCOUNTER — Telehealth: Payer: Self-pay | Admitting: Family Medicine

## 2012-08-25 ENCOUNTER — Ambulatory Visit
Admission: RE | Admit: 2012-08-25 | Discharge: 2012-08-25 | Disposition: A | Discharge status: Home / Self Care | Payer: BC Managed Care – PPO | Source: Ambulatory Visit | Attending: Family Medicine | Admitting: Family Medicine

## 2012-08-25 DIAGNOSIS — R1011 Right upper quadrant pain: Secondary | ICD-10-CM

## 2012-08-25 NOTE — Telephone Encounter (Signed)
The other thing that may be causing her pain is a duodenal ulcer. She is already on Omeprazole 40 mg daily. Tell her to increase this to bid (on an empty stomach before breakfast and before supper). Follow up next week

## 2012-08-25 NOTE — Telephone Encounter (Signed)
I spoke about results.

## 2012-08-25 NOTE — Telephone Encounter (Signed)
I spoke with pt  

## 2012-08-25 NOTE — Telephone Encounter (Signed)
Pt had abdominal ultrasound, fatty infiltration of the liver, borderline speno megly. No acute intra abdominal finding. Pt has left there office, but would like someone to call her w/ instructions. Also can view in Epic

## 2012-08-28 ENCOUNTER — Ambulatory Visit (INDEPENDENT_AMBULATORY_CARE_PROVIDER_SITE_OTHER): Payer: BC Managed Care – PPO | Admitting: Family Medicine

## 2012-08-28 ENCOUNTER — Encounter: Payer: Self-pay | Admitting: Family Medicine

## 2012-08-28 VITALS — BP 118/66 | HR 102 | Temp 98.3°F | Wt 210.0 lb

## 2012-08-28 DIAGNOSIS — K219 Gastro-esophageal reflux disease without esophagitis: Secondary | ICD-10-CM

## 2012-08-28 DIAGNOSIS — R0789 Other chest pain: Secondary | ICD-10-CM

## 2012-08-28 DIAGNOSIS — R071 Chest pain on breathing: Secondary | ICD-10-CM

## 2012-08-28 NOTE — Progress Notes (Signed)
  Subjective:    Patient ID: Laurie Sutton, female    DOB: 08-28-1980, 32 y.o.   MRN: [id number]  HPI Here for RUQ pain and GERD. Since she started on Omeprazole she has improved quite a bit. Her reflux and heartburn have almost completely disappeared. Her RIUQ persists but it is also improving. Her recent US was normal with a normal gall bladder.    Review of Systems  Constitutional: Negative.   Gastrointestinal: Positive for abdominal pain. Negative for nausea, vomiting, diarrhea, constipation, blood in stool and abdominal distention.       Objective:   Physical Exam  Constitutional: She appears well-developed and well-nourished. No distress.  Pulmonary/Chest:  Today she is more tender over the right lower ribs, both anterior and lateral.   Abdominal: Soft. Bowel sounds are normal. She exhibits no distension and no mass. There is no tenderness. There is no rebound and no guarding.          Assessment & Plan:  Her GERD is responding to the Omeprazole. Her other pain now seems to be muscular and not GI in origin. This is slowly improving. We will give it more time to heal and she will recheck prn

## 2012-10-03 ENCOUNTER — Other Ambulatory Visit: Payer: Self-pay | Admitting: Family Medicine

## 2012-10-04 NOTE — Telephone Encounter (Signed)
Call in a 6 month supply

## 2012-10-26 ENCOUNTER — Telehealth: Payer: Self-pay | Admitting: Family Medicine

## 2012-10-26 NOTE — Telephone Encounter (Signed)
Pt needs new rx omeprazole 40 mg twice a day instead of once a day #60 with refills. [facility] [address]

## 2012-10-27 MED ORDER — OMEPRAZOLE 40 MG PO CPDR: 40.0000 mg | DELAYED_RELEASE_CAPSULE | Freq: Two times a day (BID) | ORAL | Status: DC

## 2012-10-27 NOTE — Telephone Encounter (Signed)
Rx sent in to pharmacy. 

## 2012-11-22 ENCOUNTER — Telehealth: Payer: Self-pay | Admitting: Family Medicine

## 2012-11-22 NOTE — Telephone Encounter (Signed)
I spoke with pt and she is aware that Dr. Sarajane Jews is out of the office until next week.

## 2012-11-22 NOTE — Telephone Encounter (Signed)
Pt states her heart burn is not any better and would like to see if the MD could call her in protonix. She is certain insurance will cover this. [facility] [address]

## 2012-11-23 ENCOUNTER — Telehealth: Payer: Self-pay | Admitting: Family Medicine

## 2012-11-23 NOTE — Telephone Encounter (Signed)
Patient Information:  Caller Name: Aundraya  Phone: [phone]  Patient: Laurie Sutton, Laurie Sutton  Gender: Female  DOB: 02-21-1981  Age: 32 Years  PCP: Alysia [facility])  Pregnant: No  Office Follow Up:  Does the office need to follow up with this patient?: No  Instructions For The Office: N/A  RN note:  Pt has vaginal irritation w/ white discharge after starting antibiotics.  Dr Sarajane Jews is not available for remainder of the week.  Per standing orders: Diflucan 120m PO x1, no refills, [facility] [address] [phone]  Advised Pt to f/u w/ office if symptoms didn't improve after Diflucan.    Symptoms  Reason For Call & Symptoms: Vaginal irritation w/ Discharge after taking Abx  Reviewed Health History In EMR: Yes  Reviewed Medications In EMR: Yes  Reviewed Allergies In EMR: Yes  Reviewed Surgeries / Procedures: Yes  Date of Onset of Symptoms: 11/21/2012 OB / GYN:  LMP: 11/16/2012  Guideline(s) Used:  Vaginal Discharge  Disposition Per Guideline:   See Within 3 Days in Office  Reason For Disposition Reached:   Symptoms of a yeast infection" (i.e., itchy, white discharge, not bad smelling) and not improved > 3 days following Care Advice  Advice Given:  N/A  Patient Will Follow Care Advice:  YES

## 2012-11-26 NOTE — Telephone Encounter (Signed)
Call in Protonix 40 mg daily for one year

## 2012-11-27 MED ORDER — PANTOPRAZOLE SODIUM 40 MG PO TBEC: 40.0000 mg | DELAYED_RELEASE_TABLET | Freq: Every day | ORAL | Status: DC

## 2012-12-28 ENCOUNTER — Ambulatory Visit: Payer: Self-pay | Admitting: Nurse Practitioner

## 2013-02-15 ENCOUNTER — Other Ambulatory Visit: Payer: Self-pay

## 2013-03-16 ENCOUNTER — Ambulatory Visit (INDEPENDENT_AMBULATORY_CARE_PROVIDER_SITE_OTHER): Payer: BC Managed Care – PPO | Admitting: Family Medicine

## 2013-03-16 ENCOUNTER — Encounter: Payer: Self-pay | Admitting: Family Medicine

## 2013-03-16 VITALS — BP 118/74 | HR 83 | Temp 98.0°F | Wt 192.0 lb

## 2013-03-16 DIAGNOSIS — J019 Acute sinusitis, unspecified: Secondary | ICD-10-CM

## 2013-03-16 MED ORDER — LEVOFLOXACIN 500 MG PO TABS: 500.0000 mg | ORAL_TABLET | Freq: Every day | ORAL | Status: DC

## 2013-03-16 MED ORDER — CEFUROXIME AXETIL 500 MG PO TABS: 500.0000 mg | ORAL_TABLET | Freq: Two times a day (BID) | ORAL | Status: DC

## 2013-03-16 NOTE — Progress Notes (Signed)
Pre visit review using our clinic review tool, if applicable. No additional management support is needed unless otherwise documented below in the visit note. 

## 2013-03-16 NOTE — Progress Notes (Signed)
   Subjective:    Patient ID: Laurie Sutton, female    DOB: Mar 12, 1981, 32 y.o.   MRN: [id number]  HPI Here for one week of sinus pressure, HA, right ear pain, and ST. No cough.    Review of Systems  Constitutional: Negative.   HENT: Positive for congestion, postnasal drip and sinus pressure.   Eyes: Negative.   Respiratory: Positive for cough.        Objective:   Physical Exam  Constitutional: She appears well-developed and well-nourished.  HENT:  Right Ear: External ear normal.  Left Ear: External ear normal.  Nose: Nose normal.  Mouth/Throat: Oropharynx is clear and moist.  Eyes: Conjunctivae are normal.  Pulmonary/Chest: Effort normal and breath sounds normal.  Lymphadenopathy:    She has no cervical adenopathy.          Assessment & Plan:  Add Mucinex

## 2013-04-09 ENCOUNTER — Other Ambulatory Visit: Payer: Self-pay | Admitting: Family Medicine

## 2013-04-09 NOTE — Telephone Encounter (Signed)
Refill for one year 

## 2013-04-15 ENCOUNTER — Other Ambulatory Visit: Payer: Self-pay | Admitting: Family Medicine

## 2013-04-16 NOTE — Telephone Encounter (Signed)
Call in #30 with 5 rf

## 2013-05-15 ENCOUNTER — Telehealth: Payer: Self-pay | Admitting: Family Medicine

## 2013-05-15 NOTE — Telephone Encounter (Signed)
Refill request for Omeprazole 40 mg take 1 po qd and a [facility]

## 2013-05-16 NOTE — Telephone Encounter (Signed)
I spoke with pt and she has already received this script and it is twice a day.

## 2013-06-20 ENCOUNTER — Other Ambulatory Visit: Payer: Self-pay

## 2013-06-20 MED ORDER — PROPRANOLOL HCL 10 MG PO TABS: 10.0000 mg | ORAL_TABLET | Freq: Two times a day (BID) | ORAL | Status: DC

## 2013-07-12 ENCOUNTER — Other Ambulatory Visit: Payer: Self-pay | Admitting: Family Medicine

## 2013-07-18 NOTE — Telephone Encounter (Signed)
It appears she is on restoril at night had meds plus refills done in [place] Would not use ambien in addition. Have Dr Sarajane Jews decide on this one.

## 2013-08-06 ENCOUNTER — Encounter: Payer: Self-pay | Admitting: Neurology

## 2013-08-06 ENCOUNTER — Ambulatory Visit (INDEPENDENT_AMBULATORY_CARE_PROVIDER_SITE_OTHER): Payer: BC Managed Care – PPO | Admitting: Neurology

## 2013-08-06 VITALS — BP 123/72 | HR 91 | Wt 193.0 lb

## 2013-08-06 DIAGNOSIS — G43909 Migraine, unspecified, not intractable, without status migrainosus: Secondary | ICD-10-CM | POA: Insufficient documentation

## 2013-08-06 MED ORDER — PROPRANOLOL HCL 10 MG PO TABS: 20.0000 mg | ORAL_TABLET | Freq: Two times a day (BID) | ORAL | Status: DC

## 2013-08-06 MED ORDER — RIZATRIPTAN BENZOATE 10 MG PO TABS: 10.0000 mg | ORAL_TABLET | Freq: Three times a day (TID) | ORAL | Status: DC | PRN

## 2013-08-06 NOTE — Patient Instructions (Signed)
Migraine Headache A migraine headache is an intense, throbbing pain on one or both sides of your head. A migraine can last for 30 minutes to several hours. CAUSES  The exact cause of a migraine headache is not always known. However, a migraine may be caused when nerves in the brain become irritated and release chemicals that cause inflammation. This causes pain. Certain things may also trigger migraines, such as:  Alcohol.  Smoking.  Stress.  Menstruation.  Aged cheeses.  Foods or drinks that contain nitrates, glutamate, aspartame, or tyramine.  Lack of sleep.  Chocolate.  Caffeine.  Hunger.  Physical exertion.  Fatigue.  Medicines used to treat chest pain (nitroglycerine), birth control pills, estrogen, and some blood pressure medicines. SIGNS AND SYMPTOMS  Pain on one or both sides of your head.  Pulsating or throbbing pain.  Severe pain that prevents daily activities.  Pain that is aggravated by any physical activity.  Nausea, vomiting, or both.  Dizziness.  Pain with exposure to bright lights, loud noises, or activity.  General sensitivity to bright lights, loud noises, or smells. Before you get a migraine, you may get warning signs that a migraine is coming (aura). An aura may include:  Seeing flashing lights.  Seeing bright spots, halos, or zig-zag lines.  Having tunnel vision or blurred vision.  Having feelings of numbness or tingling.  Having trouble talking.  Having muscle weakness. DIAGNOSIS  A migraine headache is often diagnosed based on:  Symptoms.  Physical exam.  A CT scan or MRI of your head. These imaging tests cannot diagnose migraines, but they can help rule out other causes of headaches. TREATMENT Medicines may be given for pain and nausea. Medicines can also be given to help prevent recurrent migraines.  HOME CARE INSTRUCTIONS  Only take over-the-counter or prescription medicines for pain or discomfort as directed by your  health care provider. The use of long-term narcotics is not recommended.  Lie down in a dark, quiet room when you have a migraine.  Keep a journal to find out what may trigger your migraine headaches. For example, write down:  What you eat and drink.  How much sleep you get.  Any change to your diet or medicines.  Limit alcohol consumption.  Quit smoking if you smoke.  Get 7 9 hours of sleep, or as recommended by your health care provider.  Limit stress.  Keep lights dim if bright lights bother you and make your migraines worse. SEEK IMMEDIATE MEDICAL CARE IF:   Your migraine becomes severe.  You have a fever.  You have a stiff neck.  You have vision loss.  You have muscular weakness or loss of muscle control.  You start losing your balance or have trouble walking.  You feel faint or pass out.  You have severe symptoms that are different from your first symptoms. MAKE SURE YOU:   Understand these instructions.  Will watch your condition.  Will get help right away if you are not doing well or get worse. Document Released: 03/29/2005 Document Revised: 01/17/2013 Document Reviewed: 12/04/2012 [employer] Patient Information 2014 [employer].  

## 2013-08-06 NOTE — Progress Notes (Signed)
PATIENT: Laurie Sutton DOB: [birth year]  REASON FOR VISIT: follow up HISTORY FROM: patient  HISTORY OF PRESENT ILLNESS: Ms. Cozzolino is a 33 year old right handed female with a history of migraines. She returns today for follow up. She currently takes propanolol and maxalt for her headaches to control her migraines. She is also on Effexor for anxiety. The patient reports that she continues to have 2 headaches per week. She states that the propanolol has helped the severity of her headaches. She has not taken Maxalt in 6 months, because it was not working for her. She denies nausea and vomiting with headaches. She states that sound and light exacerbate her headaches. She is also taking Restoril at night to help her sleep. She states that this is no longer helping. She was on Ambien but that was too strong, it made her feel tired throughout the day and she didn't like the way it made her feel.  She denies any new medical issues since the last visit  REVIEW OF SYSTEMS: Full 14 system review of systems performed and notable only for:  Constitutional: N/A  Eyes: N/A Ear/Nose/Throat: N/A  Skin: N/A  Cardiovascular: N/A  Respiratory: N/A  Gastrointestinal: N/A  Genitourinary: N/A Hematology/Lymphatic: N/A  Endocrine: N/A Musculoskeletal:N/A  Allergy/Immunology: N/A  Neurological: headaches Psychiatric: N/A Sleep: N/A   ALLERGIES: Allergies  Allergen Reactions  . Amoxicillin   . Penicillins     HOME MEDICATIONS: Outpatient Prescriptions Prior to Visit  Medication Sig Dispense Refill  . omeprazole (PRILOSEC) 40 MG capsule Take 40 mg by mouth 2 (two) times daily as needed.      . propranolol (INDERAL) 10 MG tablet Take 1 tablet (10 mg total) by mouth 2 (two) times daily.  60 tablet  0  . rizatriptan (MAXALT) 10 MG tablet Take 10 mg by mouth 3 (three) times daily as needed.      . temazepam (RESTORIL) 30 MG capsule TAKE ONE CAPSULE BY MOUTH EVERY DAY AT BEDTIME AS NEEDED FOR  SLEEP  30 capsule  5  . venlafaxine (EFFEXOR) 75 MG tablet Take 75 mg by mouth 2 (two) times daily.      Marland Kitchen venlafaxine (EFFEXOR) 75 MG tablet TAKE 1 TABLET BY MOUTH TWICE A DAY  60 tablet  11  . zolpidem (AMBIEN) 10 MG tablet TAKE 1 TABLET (10 MG TOTAL) BY MOUTH AT BEDTIME AS NEEDED FOR SLEEP  30 tablet  5  . cefUROXime (CEFTIN) 500 MG tablet Take 1 tablet (500 mg total) by mouth 2 (two) times daily with a meal.  20 tablet  0  . cetirizine (ZYRTEC) 10 MG tablet Take 10 mg by mouth daily as needed for allergies.      . tranexamic acid (LYSTEDA) 650 MG TABS Take 1,300 mg by mouth as needed. Only take 1st three days of cycle for clotting       No facility-administered medications prior to visit.    PAST MEDICAL HISTORY: Past Medical History  Diagnosis Date  . Gynecological examination     sees Dr. Josefa Half  . Migraines   . GERD (gastroesophageal reflux disease)   . Murmur, cardiac   . Anxiety   . Depression     PAST SURGICAL HISTORY: No past surgical history on file.  FAMILY HISTORY: Family History  Problem Relation Age of Onset  . Hyperlipidemia Mother   . Stroke Mother   . Diabetes Mother   . Migraines Mother   . Migraines Maternal Aunt   .  Migraines Maternal Grandmother   . Cerebral aneurysm Cousin     SOCIAL HISTORY: History   Social History  . Marital Status: Married    Spouse Name: N/A    Number of Children: 1  . Years of Education: N/A   Occupational History  . Not on file.   Social History Main Topics  . Smoking status: Never Smoker   . Smokeless tobacco: Never Used  . Alcohol Use: No  . Drug Use: No  . Sexual Activity: Not on file   Other Topics Concern  . Not on file   Social History Narrative  . No narrative on file    PHYSICAL EXAM  Filed Vitals:   08/06/13 1530  BP: 123/72  Pulse: 91  Weight: 193 lb (87.544 kg)   Body mass index is 30.22 kg/(m^2).  Generalized: Well developed, in no acute distress    Neurological examination    Mentation: Alert oriented to time, place, history taking. Follows all commands speech and language fluent Cranial nerve II-XII: Pupils were equal round reactive to light extraocular movements were full, visual field were full on confrontational test.  Motor: The motor testing reveals 5 over 5 strength of all 4 extremities. Good symmetric motor tone is noted throughout.  Sensory: Sensory testing is intact to soft touch on all 4 extremities. No evidence of extinction is noted.  Coordination: Cerebellar testing reveals good finger-nose-finger and heel-to-shin bilaterally.  Gait and station: Gait is normal. Tandem gait is normal. Romberg is negative. No drift is seen.  Reflexes: Deep tendon reflexes are symmetric and normal bilaterally.   DIAGNOSTIC DATA (LABS, IMAGING, TESTING) - I reviewed patient records, labs, notes, testing and imaging myself where available.   ASSESSMENT AND PLAN 33 y.o. year old female  has a past medical history of Gynecological examination; Migraines; GERD (gastroesophageal reflux disease); Murmur, cardiac; Anxiety; and Depression. here with   1. Migraine, unspecified, without mention of intractable migraine without mention of status migrainosus  The patient continues to have migraines, the severity has decreased with propanolol but not the frequency Will increase propanolol to 2 tablets (20 mg) BID. May have to continue to increase dosage in the future if it offers benefit and the patient can tolerate it.  Reorder Maxalt 10 mg PO TID PRN. Follow up in 6 months or sooner if needed.   Ward Givens, MSN, NP-C 08/06/2013, 3:35 PM [facility] [address], [place], [place] [address] [phone]  Note: This document was prepared with digital dictation and possible smart phrase technology. Any transcriptional errors that result from this process are unintentional. Kathrynn Ducking

## 2013-08-08 ENCOUNTER — Telehealth: Payer: Self-pay | Admitting: Family Medicine

## 2013-08-08 NOTE — Telephone Encounter (Signed)
[id number] [facility] [address] requesting re-fill on omeprazole (PRILOSEC) 40 MG capsule 90 day supply

## 2013-08-09 MED ORDER — OMEPRAZOLE 40 MG PO CPDR: 40.0000 mg | DELAYED_RELEASE_CAPSULE | Freq: Two times a day (BID) | ORAL | Status: DC | PRN

## 2013-08-09 NOTE — Telephone Encounter (Signed)
I sent script e-scribe. 

## 2013-08-10 ENCOUNTER — Other Ambulatory Visit: Payer: Self-pay | Admitting: Family Medicine

## 2013-08-13 ENCOUNTER — Ambulatory Visit: Payer: BC Managed Care – PPO | Admitting: Family Medicine

## 2013-09-17 ENCOUNTER — Ambulatory Visit (INDEPENDENT_AMBULATORY_CARE_PROVIDER_SITE_OTHER): Payer: BC Managed Care – PPO | Admitting: Physician Assistant

## 2013-09-17 ENCOUNTER — Encounter: Payer: Self-pay | Admitting: Physician Assistant

## 2013-09-17 VITALS — BP 118/80 | HR 70 | Temp 98.1°F | Resp 18 | Wt 200.0 lb

## 2013-09-17 DIAGNOSIS — J029 Acute pharyngitis, unspecified: Secondary | ICD-10-CM

## 2013-09-17 DIAGNOSIS — J019 Acute sinusitis, unspecified: Secondary | ICD-10-CM

## 2013-09-17 LAB — POCT RAPID STREP A (OFFICE): Rapid Strep A Screen: NEGATIVE

## 2013-09-17 MED ORDER — DOXYCYCLINE HYCLATE 100 MG PO TABS: 100.0000 mg | ORAL_TABLET | Freq: Two times a day (BID) | ORAL | Status: DC

## 2013-09-17 NOTE — Progress Notes (Signed)
Subjective:    Patient ID: Laurie Sutton, female    DOB: 11-Mar-1981, 33 y.o.   MRN: [id number]  Sore Throat  This is a new problem. The current episode started in the past 7 days. The problem has been unchanged. The pain is worse on the right side. The pain is at a severity of 4/10. The pain is mild. Associated symptoms include diarrhea, ear pain, headaches, a hoarse voice, neck pain, swollen glands and trouble swallowing. Pertinent negatives include no abdominal pain, congestion, coughing, drooling, ear discharge, plugged ear sensation, shortness of breath, stridor or vomiting. She has had no exposure to strep or mono. She has tried NSAIDs for the symptoms. The treatment provided mild relief.      Review of Systems  Constitutional: Negative for fever and chills.  HENT: Positive for ear pain, hoarse voice, postnasal drip, sinus pressure (also has same sided tooth and jaw pain.), sneezing and trouble swallowing. Negative for congestion, drooling, ear discharge, hearing loss, mouth sores and tinnitus.   Respiratory: Negative for cough, shortness of breath and stridor.   Cardiovascular: Negative for chest pain.  Gastrointestinal: Positive for diarrhea. Negative for nausea, vomiting and abdominal pain.  Musculoskeletal: Positive for neck pain.  Neurological: Positive for headaches.  All other systems reviewed and are negative.    Past Medical History  Diagnosis Date  . Gynecological examination     sees Dr. Josefa Half  . Migraines   . GERD (gastroesophageal reflux disease)   . Murmur, cardiac   . Anxiety   . Depression    History reviewed. No pertinent past surgical history.  reports that she has never smoked. She has never used smokeless tobacco. She reports that she does not drink alcohol or use illicit drugs. family history includes Cerebral aneurysm in her cousin; Diabetes in her mother; Hyperlipidemia in her mother; Migraines in her maternal aunt, maternal grandmother, and  mother; Stroke in her mother. Allergies  Allergen Reactions  . Amoxicillin   . Penicillins        Objective:   Physical Exam  Nursing note and vitals reviewed. Constitutional: She is oriented to person, place, and time. She appears well-developed and well-nourished. No distress.  HENT:  Head: Normocephalic and atraumatic.  Right Ear: External ear normal.  Left Ear: External ear normal.  Nose: Nose normal.  Mouth/Throat: No oropharyngeal exudate.  Oropharynx slightly erythematous, no exudate.  Bilateral TMs normal.  Right maxillary sinus tender to palpation, left maxillary sinus nontender to palpation. Bilateral frontal sinuses nontender to palpation.  Eyes: Conjunctivae and EOM are normal. Pupils are equal, round, and reactive to light.  Neck: Normal range of motion. Neck supple. No JVD present.  Cardiovascular: Normal rate, regular rhythm, normal heart sounds and intact distal pulses.  Exam reveals no gallop and no friction rub.   No murmur heard. Pulmonary/Chest: Effort normal and breath sounds normal. No stridor. No respiratory distress. She has no wheezes. She has no rales. She exhibits no tenderness.  Musculoskeletal: Normal range of motion.  Lymphadenopathy:    She has no cervical adenopathy.  Neurological: She is alert and oriented to person, place, and time.  Skin: Skin is warm and dry. No rash noted. She is not diaphoretic. No erythema. No pallor.  Psychiatric: She has a normal mood and affect. Her behavior is normal. Judgment and thought content normal.    Filed Vitals:   09/17/13 0930  BP: 118/80  Pulse: 70  Temp: 98.1 F (36.7 C)  Resp: 18  Lab Results  Component Value Date   WBC 10.7* 12/14/2006   HGB 11.6* 12/14/2006   HCT 32.9* 12/14/2006   PLT 251 12/14/2006   GLUCOSE 95 12/13/2006   ALT 11 12/13/2006   AST 20 12/13/2006   NA 140 12/13/2006   K 3.2* 12/13/2006   CL 108 12/13/2006   CREATININE 0.46 12/13/2006   BUN 1* 12/13/2006   CO2 25 12/13/2006          Assessment & Plan:  Cuca was seen today for neck soreness.  Diagnoses and associated orders for this visit:  Acute sinusitis Comments: Maxillary TTP and jaw/tooth pain. Will treat with Doxy bc pcn allergic.  - doxycycline (VIBRA-TABS) 100 MG tablet; Take 1 tablet (100 mg total) by mouth 2 (two) times daily.  Sore throat Comments: Rapid strep negative. Will obtain culture. - Throat culture ([facility]) - POC Rapid Strep A     Plan is to follow up as needed or for worsening or persistent symptoms.  Patient Instructions  Doxycycline twice a day for 10 days taken with a full meal for sinus infection.  Increase fluid intake.  Throat lozenges, salt water gargles, throat sprays to help reduce or throat.  Advil for aches and pains.  Followup as needed or for worsening or persistent symptoms despite treatment.

## 2013-09-17 NOTE — Patient Instructions (Signed)
Doxycycline twice a day for 10 days taken with a full meal for sinus infection.  Increase fluid intake.  Throat lozenges, salt water gargles, throat sprays to help reduce or throat.  Advil for aches and pains.  Followup as needed or for worsening or persistent symptoms despite treatment.    Sinusitis Sinusitis is redness, soreness, and puffiness (inflammation) of the air pockets in the bones of your face (sinuses). The redness, soreness, and puffiness can cause air and mucus to get trapped in your sinuses. This can allow germs to grow and cause an infection.  HOME CARE   Drink enough fluids to keep your pee (urine) clear or pale yellow.  Use a humidifier in your home.  Run a hot shower to create steam in the bathroom. Sit in the bathroom with the door closed. Breathe in the steam 3 4 times a day.  Put a warm, moist washcloth on your face 3 4 times a day, or as told by your doctor.  Use salt water sprays (saline sprays) to wet the thick fluid in your nose. This can help the sinuses drain.  Only take medicine as told by your doctor. GET HELP RIGHT AWAY IF:   Your pain gets worse.  You have very bad headaches.  You are sick to your stomach (nauseous).  You throw up (vomit).  You are very sleepy (drowsy) all the time.  Your face is puffy (swollen).  Your vision changes.  You have a stiff neck.  You have trouble breathing. MAKE SURE YOU:   Understand these instructions.  Will watch your condition.  Will get help right away if you are not doing well or get worse. Document Released: 09/15/2007 Document Revised: 12/22/2011 Document Reviewed: 11/02/2011 [facility] Patient Information 2014 [employer].

## 2013-09-19 LAB — CULTURE, GROUP A STREP: ORGANISM ID, BACTERIA: NORMAL

## 2013-09-26 ENCOUNTER — Other Ambulatory Visit: Payer: Self-pay | Admitting: Family Medicine

## 2013-09-28 ENCOUNTER — Ambulatory Visit: Payer: BC Managed Care – PPO | Admitting: Physician Assistant

## 2013-11-05 ENCOUNTER — Ambulatory Visit: Payer: BC Managed Care – PPO | Admitting: Family Medicine

## 2013-11-12 ENCOUNTER — Encounter: Payer: Self-pay | Admitting: Family Medicine

## 2013-11-12 ENCOUNTER — Ambulatory Visit (INDEPENDENT_AMBULATORY_CARE_PROVIDER_SITE_OTHER): Payer: BC Managed Care – PPO | Admitting: Family Medicine

## 2013-11-12 VITALS — BP 120/78 | HR 91 | Ht 67.0 in | Wt 196.0 lb

## 2013-11-12 DIAGNOSIS — R739 Hyperglycemia, unspecified: Secondary | ICD-10-CM

## 2013-11-12 DIAGNOSIS — R7309 Other abnormal glucose: Secondary | ICD-10-CM

## 2013-11-12 DIAGNOSIS — M545 Low back pain, unspecified: Secondary | ICD-10-CM | POA: Insufficient documentation

## 2013-11-12 LAB — BASIC METABOLIC PANEL
BUN: 8 mg/dL (ref 6–23)
CALCIUM: 9.5 mg/dL (ref 8.4–10.5)
CO2: 25 meq/L (ref 19–32)
CREATININE: 0.6 mg/dL (ref 0.4–1.2)
Chloride: 92 mEq/L — ABNORMAL LOW (ref 96–112)
GFR: 113.75 mL/min (ref >60.00)
GLUCOSE: 332 mg/dL — AB (ref 70–99)
Potassium: 3.8 mEq/L (ref 3.5–5.1)
SODIUM: 127 meq/L — AB (ref 135–145)

## 2013-11-12 LAB — HEMOGLOBIN A1C: Hgb A1c MFr Bld: 12 % — ABNORMAL HIGH (ref 4.6–6.5)

## 2013-11-12 MED ORDER — DICLOFENAC SODIUM 75 MG PO TBEC: 75.0000 mg | DELAYED_RELEASE_TABLET | Freq: Two times a day (BID) | ORAL | Status: DC

## 2013-11-12 NOTE — Progress Notes (Signed)
   Subjective:    Patient ID: Laurie Sutton, female    DOB: [birth year], 33 y.o.   MRN: [id number]  HPI Here for two things. First her GYN, Dr. Vanessa Kick, recently did labs on her and found her A1c to be elevated Estill Bamberg is not sure but thinks this may have been 10 something). She had gestational diabetes and she has a strong family hx of diabetes. Also for 6 month she has had frequent low back pain which radiates down the right leg. Advil helps a little.    Review of Systems  Constitutional: Negative.   Respiratory: Negative.   Cardiovascular: Negative.   Musculoskeletal: Positive for back pain.  Neurological: Negative.        Objective:   Physical Exam  Constitutional: She appears well-developed and well-nourished.  Cardiovascular: Normal rate, regular rhythm, normal heart sounds and intact distal pulses.   Pulmonary/Chest: Effort normal and breath sounds normal.  Musculoskeletal:  Tender in the lower back and over the right sciatic notch. Full ROM           Assessment & Plan:  Try heat and Diclofenac for the sciatica pain. I encouraged her to walk daily and to lose some weight. It sounds like she now has diabetes but we will get some labs to day as well.

## 2013-11-12 NOTE — Progress Notes (Signed)
Pre visit review using our clinic review tool, if applicable. No additional management support is needed unless otherwise documented below in the visit note. 

## 2013-11-13 ENCOUNTER — Telehealth: Payer: Self-pay | Admitting: Family Medicine

## 2013-11-13 LAB — MICROALBUMIN / CREATININE URINE RATIO
CREATININE, U: 153.2 mg/dL
Microalb Creat Ratio: 2.4 mg/g (ref 0.0–30.0)
Microalb, Ur: 3.6 mg/dL — ABNORMAL HIGH (ref 0.0–1.9)

## 2013-11-13 NOTE — Telephone Encounter (Signed)
[id number] [facility] [address] [place] re-fill on temazepam (RESTORIL) 30 MG capsule

## 2013-11-13 NOTE — Telephone Encounter (Signed)
Call in #30 with 5 rf

## 2013-11-14 ENCOUNTER — Telehealth: Payer: Self-pay | Admitting: Family Medicine

## 2013-11-14 MED ORDER — TEMAZEPAM 30 MG PO CAPS: ORAL_CAPSULE | ORAL | Status: DC

## 2013-11-14 MED ORDER — METFORMIN HCL 1000 MG PO TABS: 1000.0000 mg | ORAL_TABLET | Freq: Two times a day (BID) | ORAL | Status: DC

## 2013-11-14 MED ORDER — AZITHROMYCIN 250 MG PO TABS: ORAL_TABLET | ORAL | Status: DC

## 2013-11-14 MED ORDER — GLIPIZIDE 10 MG PO TABS: 10.0000 mg | ORAL_TABLET | Freq: Two times a day (BID) | ORAL | Status: DC

## 2013-11-14 NOTE — Telephone Encounter (Signed)
Per Dr. Sarajane Jews call in a Z-pack and take Mucinex. I spoke with pt and sent script e-scribe.

## 2013-11-14 NOTE — Telephone Encounter (Signed)
I called in script 

## 2013-11-14 NOTE — Telephone Encounter (Signed)
Patient Information:  Caller Name: Anderia  Phone: [phone]  Patient: Laurie Sutton, Laurie Sutton  Gender: Female  DOB: 1980-06-26  Age: 33 Years  PCP: Alysia Penna [facility])  Pregnant: No  Office Follow Up:  Does the office need to follow up with this patient?: Yes  Instructions For The Office: no appts....can pt be worked in? or does MD have other instructions for pt, UC? Please call pt back asap  RN Note:  Advised she needs to be seen by provider; however, there are no available appts left for today. Assured pt will send message for MD review and someone will call her back shortly with MD instructions. Agreed to plan.  Symptoms  Reason For Call & Symptoms: Fever  (tmax 102) with sinus pressure and chest congestion and productive cough for thick yellow mucus. Onset 2 days ago. States she feels the chest congestion makes it uncomfortable to take very deep breaths and when she breaths very deeply, it also makes her start coughing. No wheezing or SOB.  Reviewed Health History In EMR: Yes  Reviewed Medications In EMR: Yes  Reviewed Allergies In EMR: Yes  Reviewed Surgeries / Procedures: Yes  Date of Onset of Symptoms: 11/12/2013  Any Fever: Yes  Fever Taken: Ear Thermometer  Fever Time Of Reading: 12:30:00  Fever Last Reading: 100.0 OB / GYN:  LMP: 11/03/2013  Guideline(s) Used:  Sinus Pain and Congestion  Disposition Per Guideline:   Go to Office Now  Reason For Disposition Reached:   Severe sinus pain  Advice Given:  For a Stuffy Nose - Use Nasal Washes:  Introduction: Saline (salt water) nasal irrigation (nasal wash) is an effective and simple home remedy for treating stuffy nose and sinus congestion. The nose can be irrigated by pouring, spraying, or squirting salt water into the nose and then letting it run back out.  How it Helps: The salt water rinses out excess mucus, washes out any irritants (dust, allergens) that might be present, and moistens the nasal cavity.  Methods: There are several ways to perform nasal irrigation. You can use a saline nasal spray bottle (available over-the-counter), a rubber ear syringe, a medical syringe without the needle, or a Neti Pot.  Pain and Fever Medicines:  For pain or fever relief, take either acetaminophen or ibuprofen.  They are over-the-counter (OTC) drugs that help treat both fever and pain. You can buy them at the drugstore.  Hydration:  Drink plenty of liquids (6-8 glasses of water daily). If the air in your home is dry, use a cool mist humidifier  Call Back If:   You become worse.  Patient Will Follow Care Advice:  YES

## 2013-11-14 NOTE — Addendum Note (Signed)
Addended by: Aggie Hacker A on: 11/14/2013 12:17 PM   Modules accepted: Orders

## 2013-11-16 ENCOUNTER — Ambulatory Visit (INDEPENDENT_AMBULATORY_CARE_PROVIDER_SITE_OTHER): Payer: BC Managed Care – PPO | Admitting: Family Medicine

## 2013-11-16 ENCOUNTER — Encounter: Payer: Self-pay | Admitting: Family Medicine

## 2013-11-16 VITALS — BP 100/78 | HR 68 | Temp 97.8°F | Wt 194.0 lb

## 2013-11-16 DIAGNOSIS — R6889 Other general symptoms and signs: Secondary | ICD-10-CM

## 2013-11-16 MED ORDER — LISINOPRIL 10 MG PO TABS: 10.0000 mg | ORAL_TABLET | Freq: Every day | ORAL | Status: DC

## 2013-11-16 NOTE — Patient Instructions (Signed)
Fever  I think you likely have a viral infection (flu like but likely too early too be flu and outside of time medication would be helpful)  You should start feeling better over the next 1-2 days.   Take ibuprofen/tylenol as needed.   If symptoms worsen or you develop shortness of breath you should seek care over weekend  If symptoms persist into Monday, call me and I will order a chest x-ray (we discussed today but agreed as no shortness of breath and clear lungs to hold off)  You are welcome to finish the z-pack but I am not clear of the benefit as strongly suspect viral

## 2013-11-16 NOTE — Progress Notes (Signed)
  Garret Reddish, MD Phone: [phone]  Subjective:   Laurie Sutton is a 33 y.o. year old very pleasant female patient who presents with the following:  Fever/cough/body aches Symptoms started Monday with cough and later developed a fever to 102 that night associated with body aches. Prescribed z-pak by phone on Wednesday. Has been taking this with no relief. Temperature of 103 last night. 100.5 was last fever this AM. Feels like symptoms were worsening until last night, now stable. Had 2 kids and a niece who she ares for that had similar symptoms recently. Chest congestion described by phone was simply that she is coughing up yellow sputum.  ROS- no dysuria. Polyuria but recently diagnosed with DM.  Denies nasal congestion but admits to feeling like sinuses are tight. No ear pain. No shortness of breath. No sore throat. She denies wheeze, or dyspnea on exertion  Past Medical History- migraines, frequent UTI, depression. Denies history of asthma or pneumonia.   Medications- reviewed and updated Current Outpatient Prescriptions  Medication Sig Dispense Refill  . azithromycin (ZITHROMAX Z-PAK) 250 MG tablet Take as directed.  6 each  0  . diclofenac (VOLTAREN) 75 MG EC tablet Take 1 tablet (75 mg total) by mouth 2 (two) times daily.  60 tablet  5  . glipiZIDE (GLUCOTROL) 10 MG tablet Take 1 tablet (10 mg total) by mouth 2 (two) times daily before a meal.  60 tablet  5  . lisinopril (PRINIVIL,ZESTRIL) 10 MG tablet Take 1 tablet (10 mg total) by mouth daily.  30 tablet  11  . metFORMIN (GLUCOPHAGE) 1000 MG tablet Take 1 tablet (1,000 mg total) by mouth 2 (two) times daily with a meal.  60 tablet  5  . omeprazole (PRILOSEC) 40 MG capsule Take 1 capsule (40 mg total) by mouth 2 (two) times daily as needed.  180 capsule  1  . propranolol (INDERAL) 10 MG tablet Take 2 tablets (20 mg total) by mouth 2 (two) times daily.  120 tablet  6  . rizatriptan (MAXALT) 10 MG tablet Take 1 tablet (10 mg  total) by mouth 3 (three) times daily as needed.  10 tablet  3  . temazepam (RESTORIL) 30 MG capsule TAKE ONE CAPSULE BY MOUTH EVERY DAY AT BEDTIME AS NEEDED FOR SLEEP  30 capsule  5  . venlafaxine (EFFEXOR) 75 MG tablet TAKE 1 TABLET BY MOUTH TWICE A DAY  60 tablet  11   No current facility-administered medications for this visit.    Objective: BP 100/78  Pulse 68  Temp(Src) 97.8 F (36.6 C)  Wt 194 lb (87.998 kg)  SpO2 99% Gen: NAD, fans herself on table HEENT: turbinates erythematous and swollen, oropharynx with mild erythema of pharynx without pharyngeal exudate, TM normal bilaterally, Mucous membranes are moist. CV: RRR no murmurs rubs or gallops Lungs: CTAB no crackles, wheeze, rhonchi Abdomen: soft/nontender/nondistended/normal bowel sounds. No rebound or guarding.  Ext: no edema Skin: warm, dry, no rash or wounds  Assessment/Plan:  Viral Infection (flu like) Advised of symptomatic care  Doubt influenza given early in season and outside benefit window for tamiflu regardless Discussed chest x-ray but given clear lungs and sats 99%, will obtain on Monday if symptoms persistent (ok for patient to just call- CMA Bevelyn Ngo may order) Given reasons for return

## 2013-11-16 NOTE — Telephone Encounter (Signed)
Patient Information:  Caller Name: Laurie Sutton  Phone: [phone]  Patient: Laurie Sutton, Laurie Sutton  Gender: Female  DOB: [birth year]  Age: 33 Years  PCP: Alysia Penna [facility])  Pregnant: No  Office Follow Up:  Does the office need to follow up with this patient?: No  Instructions For The Office: N/A  RN Note:  Ear temperature this am, 102 and took Motrin 864m this am, aches, freezing and cough, productive, "chunks of mucus" (yellow/white), wheezing and chest congestion. She has been taking the ZPac and OTC Mucinex since Wednesday and does not feel any better. ADL's effected: decreased appetite, waking during the night (Restaril not helping). She agreed to appointment today, 11/16/2013 with Dr. HYong Channel@ 11:00.   Symptoms  Reason For Call & Symptoms: Chest congestion, wheezing "breathing fine" but chest hurts when taking deep breath.  Reviewed Health History In EMR: Yes  Reviewed Medications In EMR: Yes  Reviewed Allergies In EMR: Yes  Reviewed Surgeries / Procedures: Yes  Date of Onset of Symptoms: 11/12/2013  Treatments Tried: Mucinex and Zpac  Treatments Tried Worked: No  Any Fever: Yes  Fever Taken: Axillary  Fever Time Of Reading: 09:38:00  Fever Last Reading: 100.3 OB / GYN:  LMP: 11/03/2013  Guideline(s) Used:  Influenza - Seasonal  Cough  Disposition Per Guideline:   Go to Office Now  Reason For Disposition Reached:   Fever > 100.5 F (38.1 C) and has diabetes mellitus or a weak immune system (e.g., HIV positive, cancer chemotherapy, organ transplant, splenectomy, chronic steroids)  Advice Given:  N/A  Patient Will Follow Care Advice:  YES  Appointment Scheduled:  11/16/2013 11:00:00 Appointment Scheduled Provider:  HGarret Reddish

## 2013-11-16 NOTE — Addendum Note (Signed)
Addended by: Aggie Hacker A on: 11/16/2013 09:45 AM   Modules accepted: Orders

## 2013-11-16 NOTE — Telephone Encounter (Signed)
Pt is on the schedule for today.

## 2013-11-21 ENCOUNTER — Telehealth: Payer: Self-pay | Admitting: Family Medicine

## 2013-11-21 NOTE — Telephone Encounter (Signed)
Pt was dx as dm and would like a free dm machine

## 2013-11-23 MED ORDER — GLUCOSE BLOOD VI STRP: ORAL_STRIP | Status: DC

## 2013-11-23 MED ORDER — ONETOUCH LANCETS MISC: Status: DC

## 2013-11-23 NOTE — Telephone Encounter (Signed)
I left a voice message for pt, advising to read the note in my chart and then get back in touch with [name].

## 2013-11-23 NOTE — Telephone Encounter (Signed)
[place] office called to report that her A1C is 12 and pt really needs to monitor her blood sugar.  I spoke to the pt and she did not know how to access MyChart.  Has not received a message.  I advised her to call her insurance company or her pharmacy to find out what machine her plan will pay for.  She is to call back when she has this information.

## 2013-11-23 NOTE — Telephone Encounter (Signed)
The machine is ready for pick up and I left a voice message for pt. The brand is One Touch Verio, also I sent in scripts for testing strips & lancets to local pharmacy.

## 2013-11-23 NOTE — Telephone Encounter (Signed)
Patient calling back to inform us that she has "no restrictions" on her insurance plan.

## 2013-11-23 NOTE — Addendum Note (Signed)
Addended by: Aggie Hacker A on: 11/23/2013 11:55 AM   Modules accepted: Orders

## 2013-12-03 ENCOUNTER — Telehealth: Payer: Self-pay | Admitting: Family Medicine

## 2013-12-03 NOTE — Telephone Encounter (Signed)
Pt decline ov. Pt stated she discuss with md changing her med from temazepam to something stronger. [facility] [address]. Pt is not sleeping

## 2013-12-05 MED ORDER — ZOLPIDEM TARTRATE 10 MG PO TABS: 10.0000 mg | ORAL_TABLET | Freq: Every evening | ORAL | Status: AC | PRN

## 2013-12-05 NOTE — Telephone Encounter (Signed)
Stop Temazepam and call in Zolpidem 10 mg qhs #30 with 2 rf

## 2013-12-05 NOTE — Telephone Encounter (Signed)
I called in new script, cancelled refills on the Temazepam and I spoke with pt.

## 2014-01-01 ENCOUNTER — Telehealth: Payer: Self-pay | Admitting: Family Medicine

## 2014-01-01 NOTE — Telephone Encounter (Signed)
[id number] [place] [address] requesting 90 day re-fills on the following:  metFORMIN (GLUCOPHAGE) 1000 MG tablet glipiZIDE (GLUCOTROL) 10 MG tablet

## 2014-01-03 MED ORDER — GLIPIZIDE 10 MG PO TABS: 10.0000 mg | ORAL_TABLET | Freq: Two times a day (BID) | ORAL | Status: DC

## 2014-01-03 MED ORDER — METFORMIN HCL 1000 MG PO TABS: 1000.0000 mg | ORAL_TABLET | Freq: Two times a day (BID) | ORAL | Status: DC

## 2014-01-03 NOTE — Telephone Encounter (Signed)
I sent both scripts e-scribe.

## 2014-01-15 ENCOUNTER — Telehealth: Payer: Self-pay | Admitting: Family Medicine

## 2014-01-15 NOTE — Telephone Encounter (Signed)
Pt states she was started on lisinopril (PRINIVIL,ZESTRIL) 10 MG tablet 2 months ago and since starting this med she has developed a cough, that she can't get rid of.  Pt wants to know if you can be put on an alternative medication.  Also, pt is scheduled to come in to repeat A1C on 11/2, please place future order in system.

## 2014-01-15 NOTE — Telephone Encounter (Signed)
Stop Lisinopril and switch to Losartan 50 mg daily. Call in one year supply

## 2014-01-16 ENCOUNTER — Telehealth: Payer: Self-pay | Admitting: Family Medicine

## 2014-01-16 ENCOUNTER — Emergency Department (HOSPITAL_BASED_OUTPATIENT_CLINIC_OR_DEPARTMENT_OTHER): Payer: BC Managed Care – PPO

## 2014-01-16 ENCOUNTER — Emergency Department (HOSPITAL_BASED_OUTPATIENT_CLINIC_OR_DEPARTMENT_OTHER)
Admission: EM | Admit: 2014-01-16 | Discharge: 2014-01-16 | Disposition: A | Discharge status: Home / Self Care | Payer: BC Managed Care – PPO | Attending: Emergency Medicine | Admitting: Emergency Medicine

## 2014-01-16 ENCOUNTER — Encounter (HOSPITAL_BASED_OUTPATIENT_CLINIC_OR_DEPARTMENT_OTHER): Payer: Self-pay | Admitting: Emergency Medicine

## 2014-01-16 DIAGNOSIS — Z79899 Other long term (current) drug therapy: Secondary | ICD-10-CM | POA: Insufficient documentation

## 2014-01-16 DIAGNOSIS — F329 Major depressive disorder, single episode, unspecified: Secondary | ICD-10-CM | POA: Diagnosis not present

## 2014-01-16 DIAGNOSIS — Z880 Allergy status to penicillin: Secondary | ICD-10-CM | POA: Diagnosis not present

## 2014-01-16 DIAGNOSIS — Z791 Long term (current) use of non-steroidal anti-inflammatories (NSAID): Secondary | ICD-10-CM | POA: Diagnosis not present

## 2014-01-16 DIAGNOSIS — R011 Cardiac murmur, unspecified: Secondary | ICD-10-CM | POA: Diagnosis not present

## 2014-01-16 DIAGNOSIS — R1013 Epigastric pain: Secondary | ICD-10-CM | POA: Diagnosis present

## 2014-01-16 DIAGNOSIS — K760 Fatty (change of) liver, not elsewhere classified: Secondary | ICD-10-CM | POA: Insufficient documentation

## 2014-01-16 DIAGNOSIS — Z792 Long term (current) use of antibiotics: Secondary | ICD-10-CM | POA: Diagnosis not present

## 2014-01-16 DIAGNOSIS — G43909 Migraine, unspecified, not intractable, without status migrainosus: Secondary | ICD-10-CM | POA: Insufficient documentation

## 2014-01-16 DIAGNOSIS — K219 Gastro-esophageal reflux disease without esophagitis: Secondary | ICD-10-CM | POA: Diagnosis not present

## 2014-01-16 DIAGNOSIS — E119 Type 2 diabetes mellitus without complications: Secondary | ICD-10-CM | POA: Diagnosis not present

## 2014-01-16 DIAGNOSIS — F419 Anxiety disorder, unspecified: Secondary | ICD-10-CM | POA: Insufficient documentation

## 2014-01-16 DIAGNOSIS — D649 Anemia, unspecified: Secondary | ICD-10-CM | POA: Diagnosis not present

## 2014-01-16 DIAGNOSIS — Z3202 Encounter for pregnancy test, result negative: Secondary | ICD-10-CM | POA: Insufficient documentation

## 2014-01-16 HISTORY — DX: Type 2 diabetes mellitus without complications: E11.9

## 2014-01-16 LAB — CBC WITH DIFFERENTIAL/PLATELET
Basophils Absolute: 0 10*3/uL (ref 0.0–0.1)
Basophils Relative: 1 % (ref 0–1)
EOS ABS: 0.2 10*3/uL (ref 0.0–0.7)
EOS PCT: 2 % (ref 0–5)
HEMATOCRIT: 30.1 % — AB (ref 36.0–46.0)
Hemoglobin: 9.4 g/dL — ABNORMAL LOW (ref 12.0–15.0)
LYMPHS ABS: 2.1 10*3/uL (ref 0.7–4.0)
LYMPHS PCT: 32 % (ref 12–46)
MCH: 24.5 pg — AB (ref 26.0–34.0)
MCHC: 31.2 g/dL (ref 30.0–36.0)
MCV: 78.4 fL (ref 78.0–100.0)
MONO ABS: 0.5 10*3/uL (ref 0.1–1.0)
MONOS PCT: 7 % (ref 3–12)
Neutro Abs: 3.8 10*3/uL (ref 1.7–7.7)
Neutrophils Relative %: 58 % (ref 43–77)
PLATELETS: 333 10*3/uL (ref 150–400)
RBC: 3.84 MIL/uL — AB (ref 3.87–5.11)
RDW: 16 % — ABNORMAL HIGH (ref 11.5–15.5)
WBC: 6.5 10*3/uL (ref 4.0–10.5)

## 2014-01-16 LAB — COMPREHENSIVE METABOLIC PANEL
ALT: 50 U/L — ABNORMAL HIGH (ref 0–35)
ANION GAP: 15 (ref 5–15)
AST: 64 U/L — ABNORMAL HIGH (ref 0–37)
Albumin: 3.2 g/dL — ABNORMAL LOW (ref 3.5–5.2)
Alkaline Phosphatase: 77 U/L (ref 39–117)
BUN: 7 mg/dL (ref 6–23)
CO2: 22 meq/L (ref 19–32)
CREATININE: 0.5 mg/dL (ref 0.50–1.10)
Calcium: 8.9 mg/dL (ref 8.4–10.5)
Chloride: 101 mEq/L (ref 96–112)
GFR calc Af Amer: >90 mL/min (ref >90)
GFR calc non Af Amer: >90 mL/min (ref >90)
Glucose, Bld: 123 mg/dL — ABNORMAL HIGH (ref 70–99)
Potassium: 3.6 mEq/L — ABNORMAL LOW (ref 3.7–5.3)
SODIUM: 138 meq/L (ref 137–147)
TOTAL PROTEIN: 7.1 g/dL (ref 6.0–8.3)
Total Bilirubin: <0.2 mg/dL — ABNORMAL LOW (ref 0.3–1.2)

## 2014-01-16 LAB — URINALYSIS, ROUTINE W REFLEX MICROSCOPIC
BILIRUBIN URINE: NEGATIVE
Glucose, UA: NEGATIVE mg/dL
HGB URINE DIPSTICK: NEGATIVE
Ketones, ur: NEGATIVE mg/dL
Leukocytes, UA: NEGATIVE
Nitrite: NEGATIVE
PH: 5.5 (ref 5.0–8.0)
Protein, ur: NEGATIVE mg/dL
Specific Gravity, Urine: 1.022 (ref 1.005–1.030)
Urobilinogen, UA: 1 mg/dL (ref 0.0–1.0)

## 2014-01-16 LAB — LIPASE, BLOOD: LIPASE: 35 U/L (ref 11–59)

## 2014-01-16 LAB — PREGNANCY, URINE: Preg Test, Ur: NEGATIVE

## 2014-01-16 MED ORDER — ONDANSETRON HCL 4 MG/2ML IJ SOLN
4.0000 mg | Freq: Once | INTRAMUSCULAR | Status: AC
  Administered 2014-01-16: 4 mg via INTRAVENOUS
  Filled 2014-01-16: qty 2

## 2014-01-16 MED ORDER — ONDANSETRON HCL 4 MG PO TABS: 4.0000 mg | ORAL_TABLET | Freq: Four times a day (QID) | ORAL | Status: DC

## 2014-01-16 MED ORDER — SODIUM CHLORIDE 0.9 % IV SOLN
1000.0000 mL | Freq: Once | INTRAVENOUS | Status: AC
  Administered 2014-01-16: 1000 mL via INTRAVENOUS

## 2014-01-16 MED ORDER — LANSOPRAZOLE 30 MG PO TBDP
30.0000 mg | ORAL_TABLET | Freq: Every day | ORAL | Status: DC
Start: 2014-01-16 — End: 2014-01-22

## 2014-01-16 MED ORDER — HYDROMORPHONE HCL 1 MG/ML IJ SOLN
0.5000 mg | INTRAMUSCULAR | Status: DC | PRN
  Administered 2014-01-16: 0.5 mg via INTRAVENOUS
  Filled 2014-01-16: qty 1

## 2014-01-16 MED ORDER — SODIUM CHLORIDE 0.9 % IV SOLN
1000.0000 mL | INTRAVENOUS | Status: DC
  Administered 2014-01-16: 1000 mL via INTRAVENOUS

## 2014-01-16 MED ORDER — TRAMADOL HCL 50 MG PO TABS: 50.0000 mg | ORAL_TABLET | Freq: Four times a day (QID) | ORAL | Status: DC | PRN

## 2014-01-16 NOTE — ED Notes (Signed)
Patient states she has had upper abdominal pain across the entire abdomen for the last four days.  Pain is associated with constant nausea, belching, flatus, frequent soft bm which turned to diarrhea today.  Pain feels like pressure and is radiating into her back. Decreased appetite.

## 2014-01-16 NOTE — Telephone Encounter (Signed)
Patient Information:  Caller Name: Emoree  Phone: [phone]  Patient: Laurie Sutton, Laurie Sutton  Gender: Female  DOB: 06-Mar-1981  Age: 33 Years  PCP: Alysia Penna [facility])  Pregnant: No  Office Follow Up:  Does the office need to follow up with this patient?: Yes  Instructions For The Office: Please call and advise.   Symptoms  Reason For Call & Symptoms: Pt is calling about having abdominal pain since Sunday 01/13/14. Pt has been up all night with the pain. She describes the pain as worsening to the point that now she has had it all night. Pt states it is upper abdominal with pain shooting through to her mid back - straight across.  She is also having loose stools 2-3x/day. . Pt is also nauseated but no vomiting. She has begun to have dysuria also.  Reviewed Health History In EMR: Yes  Reviewed Medications In EMR: Yes  Reviewed Allergies In EMR: Yes  Reviewed Surgeries / Procedures: Yes  Date of Onset of Symptoms: 01/13/2014 OB / GYN:  LMP: 01/04/2014  Guideline(s) Used:  Abdominal Pain - Female  Abdominal Pain - Upper  Disposition Per Guideline:   Go to ED Now (or to Office with PCP Approval)  Reason For Disposition Reached:   Constant abdominal pain lasting > 2 hours  Advice Given:  N/A  RN Overrode Recommendation:  Follow Up With Office Later  RN called Derinda Late with report d/t ED disposition. Office note sent for MD to decide if pt can be seen in the office. Pt informed and aware that office will be calling back.

## 2014-01-16 NOTE — Telephone Encounter (Signed)
Pt is going to ER due to abdominal pain. We will not make any changes right now on blood pressure medication, will wait until we get further information.

## 2014-01-16 NOTE — Discharge Instructions (Signed)

## 2014-01-16 NOTE — ED Provider Notes (Signed)
CSN: [id number]     Arrival date & time 01/16/14  1045 History   First MD Initiated Contact with Patient 01/16/14 1111     Chief Complaint  Patient presents with  . Abdominal Pain    Patient is a 33 y.o. female presenting with abdominal pain. The history is provided by the patient.  Abdominal Pain Pain location:  Epigastric, RUQ and LUQ Pain quality: cramping   Pain radiates to:  Back Pain severity:  Moderate Onset quality:  Gradual Duration:  5 days Timing:  Constant Progression:  Waxing and waning Context: not eating   Relieved by: ibuprofen and having a bm. Associated symptoms: anorexia, dysuria, flatus and nausea   Associated symptoms: no belching, no cough, no diarrhea, no fever, no vaginal bleeding, no vaginal discharge and no vomiting     Past Medical History  Diagnosis Date  . Gynecological examination     sees Dr. Josefa Half  . Migraines   . GERD (gastroesophageal reflux disease)   . Murmur, cardiac   . Anxiety   . Depression   . Diabetes mellitus without complication    Past Surgical History  Procedure Laterality Date  . Cesarean section     Family History  Problem Relation Age of Onset  . Hyperlipidemia Mother   . Stroke Mother   . Diabetes Mother   . Migraines Mother   . Migraines Maternal Aunt   . Migraines Maternal Grandmother   . Cerebral aneurysm Cousin    History  Substance Use Topics  . Smoking status: Never Smoker   . Smokeless tobacco: Never Used  . Alcohol Use: No   OB History   Grav Para Term Preterm Abortions TAB SAB Ect Mult Living                 Review of Systems  Constitutional: Negative for fever.  Respiratory: Negative for cough.   Gastrointestinal: Positive for nausea, abdominal pain, anorexia and flatus. Negative for vomiting and diarrhea.  Genitourinary: Positive for dysuria. Negative for vaginal bleeding and vaginal discharge.  All other systems reviewed and are negative.     Allergies  Amoxicillin and  Penicillins  Home Medications   Prior to Admission medications   Medication Sig Start Date End Date Taking? Authorizing Provider  diclofenac (VOLTAREN) 75 MG EC tablet Take 1 tablet (75 mg total) by mouth 2 (two) times daily. 11/12/13  Yes Laurey Morale, MD  glipiZIDE (GLUCOTROL) 10 MG tablet Take 1 tablet (10 mg total) by mouth 2 (two) times daily before a meal. 01/03/14  Yes Laurey Morale, MD  glucose blood [facility] VERIO) test strip Use as instructed 11/23/13  Yes Laurey Morale, MD  lisinopril (PRINIVIL,ZESTRIL) 10 MG tablet Take 1 tablet (10 mg total) by mouth daily. 11/16/13  Yes Laurey Morale, MD  metFORMIN (GLUCOPHAGE) 1000 MG tablet Take 1 tablet (1,000 mg total) by mouth 2 (two) times daily with a meal. 01/03/14  Yes Laurey Morale, MD  omeprazole (PRILOSEC) 40 MG capsule Take 1 capsule (40 mg total) by mouth 2 (two) times daily as needed. 08/09/13  Yes Laurey Morale, MD  ONE TOUCH LANCETS MISC Test once per day and diagnosis code is 250.00 11/23/13  Yes Laurey Morale, MD  PRESCRIPTION MEDICATION Birth Control Pills   Yes Historical Provider, MD  propranolol (INDERAL) 10 MG tablet Take 2 tablets (20 mg total) by mouth 2 (two) times daily. 08/06/13  Yes Ward Givens, NP  rizatriptan (MAXALT) 10 MG  tablet Take 1 tablet (10 mg total) by mouth 3 (three) times daily as needed. 08/06/13  Yes Ward Givens, NP  venlafaxine (EFFEXOR) 75 MG tablet TAKE 1 TABLET BY MOUTH TWICE A DAY 04/09/13  Yes Laurey Morale, MD  azithromycin (ZITHROMAX Z-PAK) 250 MG tablet Take as directed. 11/14/13   Laurey Morale, MD  lansoprazole (PREVACID SOLUTAB) 30 MG disintegrating tablet Take 1 tablet (30 mg total) by mouth daily. 01/16/14   Dorie Rank, MD  ondansetron (ZOFRAN) 4 MG tablet Take 1 tablet (4 mg total) by mouth every 6 (six) hours. 01/16/14   Dorie Rank, MD  traMADol (ULTRAM) 50 MG tablet Take 1 tablet (50 mg total) by mouth every 6 (six) hours as needed. 01/16/14   Dorie Rank, MD   BP 107/57  Pulse 75  Temp(Src) 98.6  F (37 C) (Oral)  Resp 18  Ht 5' 6.5" (1.689 m)  Wt 192 lb (87.091 kg)  BMI 30.53 kg/m2  SpO2 98%  LMP 01/07/2014 Physical Exam  Nursing note and vitals reviewed. Constitutional: She appears well-developed and well-nourished. No distress.  HENT:  Head: Normocephalic and atraumatic.  Right Ear: External ear normal.  Left Ear: External ear normal.  Eyes: Conjunctivae are normal. Right eye exhibits no discharge. Left eye exhibits no discharge. No scleral icterus.  Neck: Neck supple. No tracheal deviation present.  Cardiovascular: Normal rate, regular rhythm and intact distal pulses.   Pulmonary/Chest: Effort normal and breath sounds normal. No stridor. No respiratory distress. She has no wheezes. She has no rales.  Abdominal: Soft. Bowel sounds are normal. She exhibits no distension, no ascites and no mass. There is tenderness in the right upper quadrant. There is no rebound, no guarding and no CVA tenderness. No hernia.  Musculoskeletal: She exhibits no edema and no tenderness.  Neurological: She is alert. She has normal strength. No cranial nerve deficit (no facial droop, extraocular movements intact, no slurred speech) or sensory deficit. She exhibits normal muscle tone. She displays no seizure activity. Coordination normal.  Skin: Skin is warm and dry. No rash noted.  Psychiatric: She has a normal mood and affect.    ED Course  Procedures (including critical care time) Labs Review Labs Reviewed  CBC WITH DIFFERENTIAL - Abnormal; Notable for the following:    RBC 3.84 (*)    Hemoglobin 9.4 (*)    HCT 30.1 (*)    MCH 24.5 (*)    RDW 16.0 (*)    All other components within normal limits  COMPREHENSIVE METABOLIC PANEL - Abnormal; Notable for the following:    Potassium 3.6 (*)    Glucose, Bld 123 (*)    Albumin 3.2 (*)    AST 64 (*)    ALT 50 (*)    Total Bilirubin <0.2 (*)    All other components within normal limits  URINALYSIS, ROUTINE W REFLEX MICROSCOPIC  PREGNANCY,  URINE  LIPASE, BLOOD    Imaging Review US Abdomen Complete  01/16/2014   CLINICAL DATA:  Upper abdominal pain, primarily on the right side with nausea and diarrhea for 4 days duration  EXAM: ULTRASOUND ABDOMEN COMPLETE  COMPARISON:  Aug 25, 2012  FINDINGS: Gallbladder: No gallstones or wall thickening visualized. There is no pericholecystic fluid. No sonographic Murphy sign noted.  Common bile duct: Diameter: 3 mm. There is no intrahepatic, common hepatic, or common bile duct dilatation.  Liver: No focal lesion identified. There is overall increased echogenicity in the liver. Liver is prominent measuring approximately 18 cm  in length.  IVC: No abnormality visualized.  Pancreas: No mass or inflammatory focus.  Spleen: Spleen is enlarged measuring 13.4 x 13.2 x 5.7 cm with a measures splenic volume of 532 cubic cm. No focal splenic lesions. No perisplenic fluid. There is a small accessory spleen measuring 1.8 x 1.9 x 1.6 cm.  Right Kidney: Length: 12.6 cm. Echogenicity within normal limits. No mass or hydronephrosis visualized.  Left Kidney: Length: 11.5 cm. Echogenicity within normal limits. No mass or hydronephrosis visualized.  Abdominal aorta: No aneurysm visualized.  Other findings: No demonstrable ascites.  IMPRESSION: Liver and splenic enlargement. Diffuse increased echogenicity in the liver most likely is secondary to hepatic steatosis. While no focal liver lesions are identified, it must be cautioned that the sensitivity of ultrasound for focal liver lesions is diminished in this circumstance. Study otherwise unremarkable.   Electronically Signed   By: Lowella Grip M.D.   On: 01/16/2014 14:05   Medications  0.9 %  sodium chloride infusion (0 mLs Intravenous Stopped 01/16/14 1204)    Followed by  0.9 %  sodium chloride infusion (1,000 mLs Intravenous New Bag/Given 01/16/14 1204)  HYDROmorphone (DILAUDID) injection 0.5 mg (0.5 mg Intravenous Given 01/16/14 1201)  ondansetron (ZOFRAN) injection 4  mg (4 mg Intravenous Given 01/16/14 1201)     MDM   Final diagnoses:  Epigastric pain  Anemia, unspecified anemia type  Hepatic steatosis    Lab tests show an anemia compared to blood tests in 2008.  Pt denies any blood in her stool or dark stool.  Doubt GI bleeding.  Will have her follow up with PCP to review the blood tests.  Also discussed the ultrasound findings and hepatic enzymes.  Doubt acute surgical condition.  DC home with pain meds, zofran, and antacid.  Discussed close pcp follow up   Dorie Rank, MD 01/16/14 1429

## 2014-01-16 NOTE — Telephone Encounter (Signed)
After consult with Dr. Sarajane Jews, I called pt and advised she needs to seek treatment at the ED for symptoms, as she will need x-ray, labs and possible IV fluids if she is dehydrated.  Pt voiced her understanding and stated she would seek treatment at [facility].

## 2014-01-22 ENCOUNTER — Ambulatory Visit (INDEPENDENT_AMBULATORY_CARE_PROVIDER_SITE_OTHER): Payer: BC Managed Care – PPO | Admitting: Family Medicine

## 2014-01-22 ENCOUNTER — Encounter: Payer: Self-pay | Admitting: Family Medicine

## 2014-01-22 VITALS — BP 114/71 | HR 75 | Temp 98.6°F | Ht 66.5 in | Wt 196.0 lb

## 2014-01-22 DIAGNOSIS — R809 Proteinuria, unspecified: Secondary | ICD-10-CM

## 2014-01-22 DIAGNOSIS — N390 Urinary tract infection, site not specified: Secondary | ICD-10-CM

## 2014-01-22 DIAGNOSIS — R1084 Generalized abdominal pain: Secondary | ICD-10-CM

## 2014-01-22 DIAGNOSIS — K219 Gastro-esophageal reflux disease without esophagitis: Secondary | ICD-10-CM | POA: Insufficient documentation

## 2014-01-22 DIAGNOSIS — G4700 Insomnia, unspecified: Secondary | ICD-10-CM

## 2014-01-22 LAB — POCT URINALYSIS DIPSTICK
Bilirubin, UA: NEGATIVE
Blood, UA: NEGATIVE
Glucose, UA: NEGATIVE
KETONES UA: NEGATIVE
Nitrite, UA: NEGATIVE
PROTEIN UA: NEGATIVE
Spec Grav, UA: 1.015
UROBILINOGEN UA: 0.2
pH, UA: 6

## 2014-01-22 MED ORDER — ESOMEPRAZOLE MAGNESIUM 40 MG PO CPDR
40.0000 mg | DELAYED_RELEASE_CAPSULE | Freq: Every day | ORAL | Status: DC
Start: 2014-01-22 — End: 2014-03-12

## 2014-01-22 MED ORDER — LOSARTAN POTASSIUM 50 MG PO TABS: 50.0000 mg | ORAL_TABLET | Freq: Every day | ORAL | Status: DC

## 2014-01-22 MED ORDER — SUVOREXANT 10 MG PO TABS: 10.0000 mg | ORAL_TABLET | Freq: Every evening | ORAL | Status: DC | PRN

## 2014-01-22 MED ORDER — CIPROFLOXACIN HCL 500 MG PO TABS
500.0000 mg | ORAL_TABLET | Freq: Two times a day (BID) | ORAL | Status: DC
Start: 2014-01-22 — End: 2014-02-06

## 2014-01-22 NOTE — Progress Notes (Signed)
   Subjective:    Patient ID: Laurie Sutton, female    DOB: March 03, 1981, 33 y.o.   MRN: [id number]  HPI Here to follow up an ER visit on 01-16-14 where she had upper abdominal pains, which were worse in the RUQ. Her labs were normal except for mild elevations of transaminases. Her abdominal US showed some hepatic and splenic enlargement but her biliary tree was clear. It was felt to be due to undertreated GERD and she was told to see [name]. Since then the pain has improved but she still has intermittent epigastric pains. She had nausea at first but not now. No fever. BMs are regular. In addition for the past 2 days she has some urinary urgency and burning. Also she cannot sleep, and we have tried Xanax and Ambien and Temazepam but nothing helps much. Lastly she has developed a dry tickling cough for the past few weeks and her pharmacist said it could be a side effect of her Lisinopril.    Review of Systems  Constitutional: Negative.   HENT: Negative.   Eyes: Negative.   Respiratory: Positive for cough. Negative for shortness of breath and wheezing.   Cardiovascular: Negative.   Gastrointestinal: Positive for abdominal pain. Negative for nausea, vomiting, diarrhea, constipation, blood in stool and abdominal distention.  Genitourinary: Positive for dysuria, urgency, frequency and pelvic pain. Negative for hematuria.       Objective:   Physical Exam  Constitutional: She is oriented to person, place, and time. She appears well-developed and well-nourished.  Neck: No thyromegaly present.  Cardiovascular: Normal rate, regular rhythm, normal heart sounds and intact distal pulses.   Pulmonary/Chest: Effort normal and breath sounds normal.  Abdominal: Soft. Bowel sounds are normal. She exhibits no distension and no mass. There is no tenderness. There is no rebound and no guarding.  Musculoskeletal: She exhibits no edema.  Lymphadenopathy:    She has no cervical adenopathy.  Neurological: She is  alert and oriented to person, place, and time.          Assessment & Plan:  Her epigastric pain is consistent with GERD or duodenitis, so we will stop Omeprazole and take Nexium 40 mg in the mornings and Zantac 150 mg in the evenings. Treat the UTI with Cipro and culture the sample. Try Belsomra 10 mg qhs for sleep. Switch from Lisinopril to Losartan 50 mg daily.

## 2014-01-22 NOTE — Progress Notes (Signed)
Pre visit review using our clinic review tool, if applicable. No additional management support is needed unless otherwise documented below in the visit note. 

## 2014-01-24 LAB — URINE CULTURE
COLONY COUNT: NO GROWTH
Organism ID, Bacteria: NO GROWTH

## 2014-02-06 ENCOUNTER — Ambulatory Visit (INDEPENDENT_AMBULATORY_CARE_PROVIDER_SITE_OTHER): Payer: BC Managed Care – PPO | Admitting: Neurology

## 2014-02-06 ENCOUNTER — Encounter: Payer: Self-pay | Admitting: Neurology

## 2014-02-06 VITALS — BP 118/78 | HR 70 | Ht 66.0 in | Wt 203.0 lb

## 2014-02-06 DIAGNOSIS — G43709 Chronic migraine without aura, not intractable, without status migrainosus: Secondary | ICD-10-CM

## 2014-02-06 MED ORDER — PROPRANOLOL HCL 20 MG PO TABS: 20.0000 mg | ORAL_TABLET | Freq: Two times a day (BID) | ORAL | Status: DC

## 2014-02-06 MED ORDER — RIZATRIPTAN BENZOATE 10 MG PO TABS: 10.0000 mg | ORAL_TABLET | Freq: Three times a day (TID) | ORAL | Status: DC | PRN

## 2014-02-06 NOTE — Progress Notes (Signed)
Reason for visit: Migraine headache  Laurie Sutton is an 33 y.o. female  History of present illness:  Laurie Sutton is a 33 year old right-handed white female with a history of migraine headaches. The patient has done better with her migraines on propranolol currently taking 20 mg twice daily. She is tolerating the medications well. She indicates that she averages 3 or 4 headaches a month that may last 2 hours to 6 hours, but she usually is able to control the headaches with Maxalt. She is not missing work or other activities because of the headache. Stress and weather changes may bring on her headache. She returns to this office for an evaluation. She recently was diagnosed with diabetes.  Past Medical History  Diagnosis Date  . Gynecological examination     sees Dr. Josefa Half  . Migraines   . GERD (gastroesophageal reflux disease)   . Murmur, cardiac   . Anxiety   . Depression   . Diabetes mellitus without complication     Past Surgical History  Procedure Laterality Date  . Cesarean section      Family History  Problem Relation Age of Onset  . Hyperlipidemia Mother   . Stroke Mother   . Diabetes Mother   . Migraines Mother   . Migraines Maternal Aunt   . Migraines Maternal Grandmother   . Cerebral aneurysm Cousin     Social history:  reports that she has never smoked. She has never used smokeless tobacco. She reports that she does not drink alcohol or use illicit drugs.    Allergies  Allergen Reactions  . Amoxicillin   . Penicillins     Medications:  Current Outpatient Prescriptions on File Prior to Visit  Medication Sig Dispense Refill  . esomeprazole (NEXIUM) 40 MG capsule Take 1 capsule (40 mg total) by mouth daily.  30 capsule  11  . glipiZIDE (GLUCOTROL) 10 MG tablet Take 1 tablet (10 mg total) by mouth 2 (two) times daily before a meal.  180 tablet  1  . glucose blood (ONETOUCH VERIO) test strip Use as instructed  100 each  3  . losartan (COZAAR)  50 MG tablet Take 1 tablet (50 mg total) by mouth daily.  90 tablet  3  . metFORMIN (GLUCOPHAGE) 1000 MG tablet Take 1 tablet (1,000 mg total) by mouth 2 (two) times daily with a meal.  180 tablet  1  . norgestimate-ethinyl estradiol (ORTHO-CYCLEN,SPRINTEC,PREVIFEM) 0.25-35 MG-MCG tablet Take 1 tablet by mouth daily.      . ONE TOUCH LANCETS MISC Test once per day and diagnosis code is 250.00  100 each  3  . Suvorexant (BELSOMRA) 10 MG TABS Take 10 mg by mouth at bedtime as needed.  30 tablet  5  . traMADol (ULTRAM) 50 MG tablet Take 1 tablet (50 mg total) by mouth every 6 (six) hours as needed.  15 tablet  0  . venlafaxine (EFFEXOR) 75 MG tablet TAKE 1 TABLET BY MOUTH TWICE A DAY  60 tablet  11   No current facility-administered medications on file prior to visit.    ROS:  Out of a complete 14 system review of symptoms, the patient complains only of the following symptoms, and all other reviewed systems are negative.  Restless legs, insomnia, daytime sleepiness Headache  Blood pressure 118/78, pulse 70, height 5' 6"  (1.676 m), weight 203 lb (92.08 kg), last menstrual period 01/07/2014.  Physical Exam  General: The patient is alert and cooperative at the time  of the examination. The patient is moderately obese.  Skin: No significant peripheral edema is noted.   Neurologic Exam  Mental status: The patient is oriented x 3.  Cranial nerves: Facial symmetry is present. Speech is normal, no aphasia or dysarthria is noted. Extraocular movements are full. Visual fields are full.  Motor: The patient has good strength in all 4 extremities.  Sensory examination: Soft touch sensation is symmetric on the face, arms, and legs.  Coordination: The patient has good finger-nose-finger and heel-to-shin bilaterally.  Gait and station: The patient has a normal gait. Tandem gait is normal. Romberg is negative. No drift is seen.  Reflexes: Deep tendon reflexes are  symmetric.   Assessment/Plan:  1. Migraine headache  The patient is doing well at this time with the migraines. She will continue the propranolol and the Maxalt. She will follow-up in 6 months, or sooner if needed. She will contact our office if the headaches worsen.  Jill Alexanders MD 02/06/2014 7:16 PM  [facility] [address] [place], [place] [address]  Phone [phone] Fax [phone]

## 2014-02-06 NOTE — Patient Instructions (Signed)

## 2014-02-11 ENCOUNTER — Other Ambulatory Visit: Payer: BC Managed Care – PPO

## 2014-02-14 ENCOUNTER — Other Ambulatory Visit: Payer: BC Managed Care – PPO

## 2014-02-20 ENCOUNTER — Other Ambulatory Visit: Payer: BC Managed Care – PPO

## 2014-02-26 ENCOUNTER — Encounter: Payer: Self-pay | Admitting: *Deleted

## 2014-02-26 ENCOUNTER — Other Ambulatory Visit (INDEPENDENT_AMBULATORY_CARE_PROVIDER_SITE_OTHER): Payer: BC Managed Care – PPO

## 2014-02-26 DIAGNOSIS — R300 Dysuria: Secondary | ICD-10-CM

## 2014-02-26 DIAGNOSIS — R739 Hyperglycemia, unspecified: Secondary | ICD-10-CM

## 2014-02-26 LAB — POCT URINALYSIS DIPSTICK
Bilirubin, UA: NEGATIVE
Glucose, UA: NEGATIVE
KETONES UA: NEGATIVE
Nitrite, UA: NEGATIVE
PH UA: 5.5
Spec Grav, UA: 1.025
Urobilinogen, UA: 0.2

## 2014-02-26 LAB — HEMOGLOBIN A1C: Hgb A1c MFr Bld: 6.5 % (ref 4.6–6.5)

## 2014-02-26 NOTE — Addendum Note (Signed)
Addended by: Elmer Picker on: 02/26/2014 11:17 AM   Modules accepted: Orders

## 2014-02-28 LAB — URINE CULTURE
COLONY COUNT: NO GROWTH
Organism ID, Bacteria: NO GROWTH

## 2014-03-12 ENCOUNTER — Other Ambulatory Visit: Payer: Self-pay

## 2014-03-12 MED ORDER — ESOMEPRAZOLE MAGNESIUM 40 MG PO CPDR: 40.0000 mg | DELAYED_RELEASE_CAPSULE | Freq: Every day | ORAL | Status: DC

## 2014-03-12 NOTE — Telephone Encounter (Signed)
[facility] day supply of Esomeprazole 40 mg.  Rx sent to pharmacy.

## 2014-03-13 ENCOUNTER — Telehealth: Payer: Self-pay | Admitting: Family Medicine

## 2014-03-13 MED ORDER — ESOMEPRAZOLE MAGNESIUM 40 MG PO CPDR: 40.0000 mg | DELAYED_RELEASE_CAPSULE | Freq: Two times a day (BID) | ORAL | Status: DC

## 2014-03-13 NOTE — Telephone Encounter (Signed)
Increase the Nexium to BID and see if that helps

## 2014-03-13 NOTE — Telephone Encounter (Signed)
Laurie Sutton pt called and ask if you would call her back. She said she is having some issues with heartburn. I tried to get her to speak with CAN .

## 2014-03-13 NOTE — Telephone Encounter (Signed)
I sent new script e-scribe and spoke with pt.

## 2014-03-13 NOTE — Telephone Encounter (Signed)
I spoke with pt and she is having severe heartburn at night. She is currently taking Nexium once in the morning & Tagamet OTC at night. What else can you recommend?

## 2014-03-18 ENCOUNTER — Encounter: Payer: Self-pay | Admitting: Family Medicine

## 2014-03-18 ENCOUNTER — Ambulatory Visit (INDEPENDENT_AMBULATORY_CARE_PROVIDER_SITE_OTHER): Payer: BC Managed Care – PPO | Admitting: Family Medicine

## 2014-03-18 VITALS — BP 123/67 | HR 80 | Temp 98.5°F | Ht 66.5 in | Wt 200.0 lb

## 2014-03-18 DIAGNOSIS — E119 Type 2 diabetes mellitus without complications: Secondary | ICD-10-CM | POA: Insufficient documentation

## 2014-03-18 NOTE — Progress Notes (Signed)
Pre visit review using our clinic review tool, if applicable. No additional management support is needed unless otherwise documented below in the visit note. 

## 2014-03-18 NOTE — Progress Notes (Signed)
   Subjective:    Patient ID: Laurie Sutton, female    DOB: 1980/12/30, 33 y.o.   MRN: [id number]  HPI Here to follow up on diabetes. Her A1c several weeks ago was excellent at 6.5. Her am fasting glucoses run from 97 to 127. She has been having a lot of diarrhea however, and she thinks this is due to the Metformin.    Review of Systems  Constitutional: Negative.   Respiratory: Negative.   Cardiovascular: Negative.   Gastrointestinal: Positive for diarrhea. Negative for nausea, vomiting, abdominal pain, constipation, blood in stool, abdominal distention, anal bleeding and rectal pain.       Objective:   Physical Exam  Constitutional: She appears well-developed and well-nourished.  Cardiovascular: Normal rate, regular rhythm, normal heart sounds and intact distal pulses.   Pulmonary/Chest: Effort normal and breath sounds normal.  Abdominal: Soft. Bowel sounds are normal. She exhibits no distension and no mass. There is no tenderness. There is no rebound and no guarding.          Assessment & Plan:  We will decrease the Metformin to 500 mg bid. Recheck in one month

## 2014-03-22 ENCOUNTER — Encounter: Payer: Self-pay | Admitting: Family Medicine

## 2014-03-29 ENCOUNTER — Telehealth: Payer: Self-pay | Admitting: *Deleted

## 2014-03-29 ENCOUNTER — Telehealth: Payer: Self-pay | Admitting: Family Medicine

## 2014-03-29 NOTE — Telephone Encounter (Signed)
It looks like she was seen in the ED in October for this and then by her doctor. And was to take nexium 40 in the mornings and zantac 150 in the evening. If she is not improving or worsening of similar symptoms can we get her an appt with Dr. Sarajane Jews Monday? If she feels this is a new or severe symptom or can't wait advise ucc.

## 2014-03-29 NOTE — Telephone Encounter (Signed)
Lattie Haw nurse with [facility] transferred the pt to me and she stated she has had severe burning in her chest and denies any shortness of breath nor chest pain.  States she was seen by Dr Sarajane Jews 2 weeks ago and he increased the Nexium to 32m BID which only provided 3-4 days of relief.  She complains of increased burning when she bends over and has used Tums with no relief. Please call the pt at [phone]  I informed the pt Dr FSarajane Jewsis not in the office at this time.  Message forwarded to Dr KMaudie Mercury

## 2014-03-29 NOTE — Telephone Encounter (Signed)
Patient informed of the message below and I scheduled an appt with Dr Sarajane Jews for Monday at 1pm.

## 2014-03-29 NOTE — Telephone Encounter (Signed)
PLEASE NOTE: All timestamps contained within this report are represented as [place] Standard Time. CONFIDENTIALTY NOTICE: This fax transmission is intended only for the addressee. It contains information that is legally privileged, confidential or otherwise protected from use or disclosure. If you are not the intended recipient, you are strictly prohibited from reviewing, disclosing, copying using or disseminating any of this information or taking any action in reliance on or regarding this information. If you have received this fax in error, please notify us immediately by telephone so that we can arrange for its return to [name]. Phone: [phone], Toll-Free: [phone], Fax: [phone] Page: 1 of 2 Call Id: [id number] [facility] Client [place] Patient Name: Laurie Sutton Gender: Female DOB: 12/06/1980 Age: 33 Y 1 M 22 D Return Phone Number: [phone] (Primary) Address: City/State/Zip: [place] Client [place] [facility] - Client Client Site [place] - Day Physician [place], [place] Type Call Call Type Triage / Clinical Relationship To Patient Self Return Phone Number [phone] (Primary) Chief Complaint Indigestion/Acid Reflux Initial Comment Caller states has heartburn, takes Nexium 40 mg bid and still having severe heartburn PreDisposition Home Care Nurse Assessment Nurse: Chesley Noon, RN, Lattie Haw Date/Time Eilene Ghazi Time): 03/29/2014 3:05:04 PM Confirm and document reason for call. If symptomatic, describe symptoms. ---Caller states she has heartburn, taking Nexium generic for 4-6 months, dosage increased 2 weeks ago. Helped at first but now is having severe heartburn. Has the patient traveled out of the country within the last 30 days? ---Not Applicable Does the patient require triage? ---Yes Related visit to physician within the last 2 weeks? ---Yes Does the PT  have any chronic conditions? (i.e. diabetes, asthma, etc.) ---Yes List chronic conditions. ---diabetes, acid reflux Did the patient indicate they were pregnant? ---No Guidelines Guideline Title Affirmed Question Affirmed Notes Nurse Date/Time (Eastern Time) Chest Pain [1] Patient claims chest pain is same as previously diagnosed "heartburn" AND [2] describes burning in chest AND [3] accompanying sour taste in mouth Jamas Lav 03/29/2014 3:12:31 PM Disp. Time Eilene Ghazi Time) Disposition Final User 03/29/2014 2:47:03 PM Attempt made - message left Orlene Och 03/29/2014 2:58:12 PM Send To Clinical Follow Up Barrett Henle, RN, Alveta Heimlich 03/29/2014 3:19:57 PM Call PCP within 24 Hours Yes Chesley Noon, RN, Lattie Haw PLEASE NOTE: All timestamps contained within this report are represented as [place] Standard Time. CONFIDENTIALTY NOTICE: This fax transmission is intended only for the addressee. It contains information that is legally privileged, confidential or otherwise protected from use or disclosure. If you are not the intended recipient, you are strictly prohibited from reviewing, disclosing, copying using or disseminating any of this information or taking any action in reliance on or regarding this information. If you have received this fax in error, please notify us immediately by telephone so that we can arrange for its return to [name]. Phone: [phone], Toll-Free: [phone], Fax: [phone] Page: 2 of 2 Call Id: [id number] [place] Understands: Yes Disagree/Comply: Comply Care Advice Given Per Guideline CALL PCP WITHIN 24 HOURS: You need to discuss this with your doctor within the next 24 hours. * IF OFFICE WILL BE OPEN: Call the office when it opens tomorrow morning. GENERAL CARE ADVICE FOR GASTRIC REFLUX: * Avoid laying down for 3+ hours after eating * Stop smoking and decrease caffeine intake * Eat less fatty foods * Elevate head of bed * Mylanta: 2 tablespoons 3-4 times a day. FOOD:  Eat smaller meals and avoid snacks right before sleeping.  Avoid the following foods which tend to aggravate heartburn and stomach problems: fatty/greasy foods, spicy foods, caffeinated beverages, and chocolate. ANTACIDS: Try taking an antacid (e.g., Mylanta, Maalox) 1 hour after meals and before bedtime. Dose: 2 tablespoons of liquid. CALL BACK IF: * Difficulty breathing or unusual sweating occurs * Chest pain increases in frequency, duration or severity * Chest pain lasts over 5 minutes * You become worse. CARE ADVICE given per Chest Pain (Adult) guideline. After Care Instructions Given Call Event Type User Date / Time Description Comments User: Dinah Beers, RN Date/Time Eilene Ghazi Time): 03/29/2014 3:23:38 PM Warm transferred to [place] at [facility] for appointment

## 2014-03-30 ENCOUNTER — Other Ambulatory Visit: Payer: Self-pay | Admitting: Family Medicine

## 2014-04-01 ENCOUNTER — Encounter: Payer: Self-pay | Admitting: Family Medicine

## 2014-04-01 ENCOUNTER — Ambulatory Visit (INDEPENDENT_AMBULATORY_CARE_PROVIDER_SITE_OTHER): Payer: BC Managed Care – PPO | Admitting: Family Medicine

## 2014-04-01 VITALS — BP 125/84 | HR 72 | Temp 98.2°F | Ht 66.5 in | Wt 202.0 lb

## 2014-04-01 DIAGNOSIS — K219 Gastro-esophageal reflux disease without esophagitis: Secondary | ICD-10-CM

## 2014-04-01 NOTE — Progress Notes (Signed)
   Subjective:    Patient ID: Laurie Sutton, female    DOB: 03/31/1981, 33 y.o.   MRN: [id number]  HPI Here for frequent heartburn, nausea, and belching despite taking Nexium 40 mg bid. No trouble swallowing. Using TUMS in between doses.    Review of Systems  Constitutional: Negative.   Respiratory: Negative.   Cardiovascular: Positive for chest pain. Negative for palpitations and leg swelling.  Gastrointestinal: Negative.        Objective:   Physical Exam  Constitutional: She appears well-developed and well-nourished.  Cardiovascular: Normal rate, regular rhythm, normal heart sounds and intact distal pulses.   Pulmonary/Chest: Effort normal and breath sounds normal.  Abdominal: Soft. Bowel sounds are normal. She exhibits no distension and no mass. There is no tenderness. There is no rebound and no guarding.          Assessment & Plan:  Refer to GI for possible upper endoscopy.

## 2014-04-01 NOTE — Progress Notes (Signed)
Pre visit review using our clinic review tool, if applicable. No additional management support is needed unless otherwise documented below in the visit note. 

## 2014-04-02 ENCOUNTER — Encounter: Payer: Self-pay | Admitting: Gastroenterology

## 2014-04-03 ENCOUNTER — Other Ambulatory Visit: Payer: Self-pay

## 2014-04-03 MED ORDER — PROPRANOLOL HCL 20 MG PO TABS: 20.0000 mg | ORAL_TABLET | Freq: Two times a day (BID) | ORAL | Status: DC

## 2014-04-03 NOTE — Telephone Encounter (Signed)
Pharmacy requests 90 day Rx  

## 2014-04-10 ENCOUNTER — Other Ambulatory Visit: Payer: Self-pay | Admitting: *Deleted

## 2014-04-10 MED ORDER — VENLAFAXINE HCL 75 MG PO TABS: 75.0000 mg | ORAL_TABLET | Freq: Two times a day (BID) | ORAL | Status: DC

## 2014-04-10 NOTE — Telephone Encounter (Signed)
Rx done. 

## 2014-04-15 ENCOUNTER — Encounter: Payer: Self-pay | Admitting: Family Medicine

## 2014-04-15 ENCOUNTER — Ambulatory Visit (INDEPENDENT_AMBULATORY_CARE_PROVIDER_SITE_OTHER): Payer: BC Managed Care – PPO | Admitting: Family Medicine

## 2014-04-15 VITALS — BP 112/72 | HR 78 | Temp 98.1°F | Resp 16 | Ht 66.5 in | Wt 208.0 lb

## 2014-04-15 DIAGNOSIS — J0100 Acute maxillary sinusitis, unspecified: Secondary | ICD-10-CM

## 2014-04-15 MED ORDER — CEFUROXIME AXETIL 500 MG PO TABS: 500.0000 mg | ORAL_TABLET | Freq: Two times a day (BID) | ORAL | Status: DC

## 2014-04-15 NOTE — Progress Notes (Signed)
   Subjective:    Patient ID: Laurie Sutton, female    DOB: 01/13/1981, 34 y.o.   MRN: [id number]  HPI Here for one week of sinus pressure, PND, ST, and a dry cough.    Review of Systems  Constitutional: Negative.   HENT: Positive for congestion, postnasal drip and sinus pressure.   Eyes: Negative.   Respiratory: Positive for cough.        Objective:   Physical Exam  Constitutional: She appears well-developed and well-nourished.  HENT:  Right Ear: External ear normal.  Left Ear: External ear normal.  Nose: Nose normal.  Mouth/Throat: Oropharynx is clear and moist.  Eyes: Conjunctivae are normal.  Pulmonary/Chest: Effort normal and breath sounds normal.  Lymphadenopathy:    She has no cervical adenopathy.          Assessment & Plan:  Add Mucinex.

## 2014-04-15 NOTE — Progress Notes (Signed)
Pre visit review using our clinic review tool, if applicable. No additional management support is needed unless otherwise documented below in the visit note. 

## 2014-05-20 ENCOUNTER — Ambulatory Visit (INDEPENDENT_AMBULATORY_CARE_PROVIDER_SITE_OTHER): Payer: BLUE CROSS/BLUE SHIELD | Admitting: Gastroenterology

## 2014-05-20 ENCOUNTER — Encounter: Payer: Self-pay | Admitting: Gastroenterology

## 2014-05-20 VITALS — BP 120/70 | HR 80 | Ht 66.5 in | Wt 208.6 lb

## 2014-05-20 DIAGNOSIS — K219 Gastro-esophageal reflux disease without esophagitis: Secondary | ICD-10-CM

## 2014-05-20 MED ORDER — ESOMEPRAZOLE MAGNESIUM 40 MG PO PACK: 40.0000 mg | PACK | Freq: Two times a day (BID) | ORAL | Status: DC

## 2014-05-20 NOTE — Progress Notes (Signed)
HPI: This is a   very pleasant 34 year old whom I'm meeting for the first time today. She is a very close friend of Carla Drape here in our office  Pyrosis for a long time,   Tried omeprazole once dialy, worked however 4-6 months ago it stopped working, increaased to bid, then she switched to nexium.  That helped for a bit, then stopped working.    Currently on nexium brand 28m bid and for the past month. This has really helped.  Has only needed tums 5 times in the past month.  She started metformin 5-6 months ago.  Symptoms are chest burning.  Can occur anytime, usually with activity.  No dysphagia.  Overall her weight was down, now back to usual.  Will drink 5-10 cans of pepsi per day.  Drinks tea occasionally.  Has no other caffeinated beverages  Non smoker.  No chocolate. Eats 10 peppermint candies per day.  This started 4-5 months ago   Review of systems: Pertinent positive and negative review of systems were noted in the above HPI section. Complete review of systems was performed and was otherwise normal.    Past Medical History  Diagnosis Date  . Gynecological examination     sees Dr. BJosefa Half . Migraines   . GERD (gastroesophageal reflux disease)   . Murmur, cardiac   . Anxiety   . Depression   . Diabetes mellitus without complication     Past Surgical History  Procedure Laterality Date  . Cesarean section      Current Outpatient Prescriptions  Medication Sig Dispense Refill  . esomeprazole (NEXIUM) 40 MG capsule Take 1 capsule (40 mg total) by mouth 2 (two) times daily before a meal. 180 capsule 3  . glipiZIDE (GLUCOTROL) 10 MG tablet Take 1 tablet (10 mg total) by mouth 2 (two) times daily before a meal. 180 tablet 1  . glucose blood (ONETOUCH VERIO) test strip Use as instructed 100 each 3  . losartan (COZAAR) 50 MG tablet Take 1 tablet (50 mg total) by mouth daily. 90 tablet 3  . metFORMIN (GLUCOPHAGE) 1000 MG tablet Take 1 tablet (1,000 mg total) by mouth  2 (two) times daily with a meal. (Patient taking differently: Take 500 mg by mouth 2 (two) times daily with a meal. ) 180 tablet 1  . norgestimate-ethinyl estradiol (ORTHO-CYCLEN,SPRINTEC,PREVIFEM) 0.25-35 MG-MCG tablet Take 1 tablet by mouth daily.    . ONE TOUCH LANCETS MISC Test once per day and diagnosis code is 250.00 100 each 3  . propranolol (INDERAL) 20 MG tablet Take 1 tablet (20 mg total) by mouth 2 (two) times daily. 180 tablet 1  . rizatriptan (MAXALT) 10 MG tablet Take 1 tablet (10 mg total) by mouth 3 (three) times daily as needed. 10 tablet 5  . Suvorexant (BELSOMRA) 10 MG TABS Take 10 mg by mouth at bedtime as needed. 30 tablet 5  . venlafaxine (EFFEXOR) 75 MG tablet Take 1 tablet (75 mg total) by mouth 2 (two) times daily. 60 tablet 1   No current facility-administered medications for this visit.    Allergies as of 05/20/2014 - Review Complete 05/20/2014  Allergen Reaction Noted  . Amoxicillin  02/16/2011  . Penicillins  02/16/2011    Family History  Problem Relation Age of Onset  . Hyperlipidemia Mother   . Stroke Mother   . Diabetes Mother   . Migraines Mother   . Migraines Maternal Aunt   . Migraines Maternal Grandmother   . Cerebral aneurysm Cousin  History   Social History  . Marital Status: Married    Spouse Name: N/A    Number of Children: 1  . Years of Education: N/A   Occupational History  . Not on file.   Social History Main Topics  . Smoking status: Never Smoker   . Smokeless tobacco: Never Used  . Alcohol Use: No  . Drug Use: No  . Sexual Activity: Yes    Birth Control/ Protection: Pill   Other Topics Concern  . Not on file   Social History Narrative       Physical Exam: BP 120/70 mmHg  Pulse 80  Ht 5' 6.5" (1.689 m)  Wt 208 lb 9.6 oz (94.62 kg)  BMI 33.17 kg/m2  LMP 05/13/2014 Constitutional: generally well-appearing Psychiatric: alert and oriented x3 Eyes: extraocular movements intact Mouth: oral pharynx moist, no  lesions Neck: supple no lymphadenopathy Cardiovascular: heart regular rate and rhythm Lungs: clear to auscultation bilaterally Abdomen: soft, nontender, nondistended, no obvious ascites, no peritoneal signs, normal bowel sounds Extremities: no lower extremity edema bilaterally Skin: no lesions on visible extremities    Assessment and plan: 34 y.o. female with  worsening GERD without other alarm symptoms  It is interesting that her GERD is worsened right around the same time she started eating about 10 peppermint candies per day. She also drinks 5-7 caffeinated beverages per day. Usually of Pepsi, nondiet. I do not think there is anything serious going on here. I think lifestyle modifications will go along way to significantly helping her pyrosis. She is going to stop peppermints completely. She is also try cutting back on her caffeine intake and I strongly recommended that she try to switch to a diet soda if she is having so that all given the completely worthless 752,000 cal which she was consuming per day and sodas. She will try to taper off of Nexium as tolerated while she makes these changes. She knows to call here in 4-6 weeks to report on her progress.

## 2014-05-20 NOTE — Patient Instructions (Signed)
Cut out peppermints. Cut back on caffeine beverages (1-2 per day). You should be able to cut back on antiacid med as you change the above. Call Dr. [name] in 6-8 weeks to report on your symptoms. No eating within 2-3 hours of laying down. Try OTC zantac or pepcid at bedtime on nights where you may have worse GERD.

## 2014-06-24 ENCOUNTER — Ambulatory Visit (INDEPENDENT_AMBULATORY_CARE_PROVIDER_SITE_OTHER): Payer: BLUE CROSS/BLUE SHIELD | Admitting: Family Medicine

## 2014-06-24 ENCOUNTER — Encounter: Payer: Self-pay | Admitting: Family Medicine

## 2014-06-24 VITALS — BP 110/60 | HR 77 | Temp 98.7°F | Ht 66.5 in | Wt 207.0 lb

## 2014-06-24 DIAGNOSIS — J069 Acute upper respiratory infection, unspecified: Secondary | ICD-10-CM

## 2014-06-24 NOTE — Progress Notes (Signed)
   Subjective:    Patient ID: Laurie Sutton, female    DOB: Mar 13, 1981, 34 y.o.   MRN: [id number]  HPI Here for 2 days of stuffy head, PND, ST and a d dry cough. No fever. Her daughter has the same symptoms and her pediatrician said she had a viral infection this morning.    Review of Systems  Constitutional: Negative.   HENT: Positive for congestion and postnasal drip. Negative for sinus pressure.   Eyes: Negative.   Respiratory: Positive for cough.        Objective:   Physical Exam  Constitutional: She appears well-developed and well-nourished.  HENT:  Right Ear: External ear normal.  Left Ear: External ear normal.  Nose: Nose normal.  Mouth/Throat: Oropharynx is clear and moist.  Eyes: Conjunctivae are normal.  Pulmonary/Chest: Effort normal and breath sounds normal.  Lymphadenopathy:    She has no cervical adenopathy.          Assessment & Plan:  This is a viral illness and she will drink fluids and take Mucinex prn.

## 2014-06-24 NOTE — Progress Notes (Signed)
Pre visit review using our clinic review tool, if applicable. No additional management support is needed unless otherwise documented below in the visit note. 

## 2014-07-02 ENCOUNTER — Other Ambulatory Visit: Payer: Self-pay | Admitting: Family Medicine

## 2014-07-03 ENCOUNTER — Other Ambulatory Visit: Payer: Self-pay | Admitting: Family Medicine

## 2014-07-03 MED ORDER — VENLAFAXINE HCL 75 MG PO TABS: ORAL_TABLET | ORAL | Status: DC

## 2014-07-03 NOTE — Telephone Encounter (Signed)
[facility] is requesting a 90 day supply of this medication.  Pt seen in Dec for diabetes.  Should have returned in 1 month.  Please advise.  Thanks!

## 2014-07-08 ENCOUNTER — Other Ambulatory Visit: Payer: Self-pay | Admitting: Family Medicine

## 2014-07-08 MED ORDER — VENLAFAXINE HCL 75 MG PO TABS: ORAL_TABLET | ORAL | Status: DC

## 2014-07-08 NOTE — Telephone Encounter (Signed)
Refill request for Venlafaxine 75 mg and send to [facility] [address] ( 90 day supply ). I resent script e-scribe for the 90 day supply.

## 2014-07-12 ENCOUNTER — Ambulatory Visit (INDEPENDENT_AMBULATORY_CARE_PROVIDER_SITE_OTHER): Payer: BLUE CROSS/BLUE SHIELD | Admitting: Family Medicine

## 2014-07-12 ENCOUNTER — Encounter: Payer: Self-pay | Admitting: Family Medicine

## 2014-07-12 VITALS — BP 124/70 | HR 94 | Temp 97.9°F | Wt 209.0 lb

## 2014-07-12 DIAGNOSIS — J069 Acute upper respiratory infection, unspecified: Secondary | ICD-10-CM | POA: Diagnosis not present

## 2014-07-12 DIAGNOSIS — B9789 Other viral agents as the cause of diseases classified elsewhere: Principal | ICD-10-CM

## 2014-07-12 NOTE — Patient Instructions (Signed)

## 2014-07-12 NOTE — Progress Notes (Signed)
   Subjective:    Patient ID: Laurie Sutton, female    DOB: 08-12-1980, 34 y.o.   MRN: [id number]  HPI   Onset 2-3 days ago of URI symptoms.  She has had some fever, chills, myalgia, sore throat, nasal congestion, and cough. Had cold-like symptoms couple weeks ago those fully resolved. She is using over-the-counter medications with some relief. Cough has been only mild thus far.  Past Medical History  Diagnosis Date  . Gynecological examination     sees Dr. Josefa Half  . Migraines   . GERD (gastroesophageal reflux disease)   . Murmur, cardiac   . Anxiety   . Depression   . Diabetes mellitus without complication    Past Surgical History  Procedure Laterality Date  . Cesarean section      reports that she has never smoked. She has never used smokeless tobacco. She reports that she does not drink alcohol or use illicit drugs. family history includes Cerebral aneurysm in her cousin; Diabetes in her mother; Hyperlipidemia in her mother; Migraines in her maternal aunt, maternal grandmother, and mother; Stroke in her mother. Allergies  Allergen Reactions  . Amoxicillin   . Penicillins       Review of Systems  Constitutional: Positive for fever, chills and fatigue.  HENT: Positive for congestion and sore throat.   Respiratory: Positive for cough.   Musculoskeletal: Positive for myalgias.  Skin: Negative for rash.  Neurological: Positive for headaches.       Objective:   Physical Exam  Constitutional: She appears well-developed and well-nourished.  HENT:  Right Ear: External ear normal.  Left Ear: External ear normal.  Mouth/Throat: Oropharynx is clear and moist.  Neck: Neck supple.  Cardiovascular: Normal rate and regular rhythm.   Pulmonary/Chest: Effort normal and breath sounds normal. No respiratory distress. She has no wheezes. She has no rales.  Lymphadenopathy:    She has no cervical adenopathy.          Assessment & Plan:  Viral URI with cough.   Non-focal exam. Treat symptoms with OTC medications prn.

## 2014-07-12 NOTE — Progress Notes (Signed)
Pre visit review using our clinic review tool, if applicable. No additional management support is needed unless otherwise documented below in the visit note. 

## 2014-07-13 ENCOUNTER — Other Ambulatory Visit: Payer: Self-pay | Admitting: Family Medicine

## 2014-07-15 ENCOUNTER — Telehealth: Payer: Self-pay

## 2014-07-15 NOTE — Telephone Encounter (Signed)
Left a message for return call.  

## 2014-07-15 NOTE — Telephone Encounter (Signed)
PLEASE NOTE: All timestamps contained within this report are represented as [place] Standard Time. CONFIDENTIALTY NOTICE: This fax transmission is intended only for the addressee. It contains information that is legally privileged, confidential or otherwise protected from use or disclosure. If you are not the intended recipient, you are strictly prohibited from reviewing, disclosing, copying using or disseminating any of this information or taking any action in reliance on or regarding this information. If you have received this fax in error, please notify us immediately by telephone so that we can arrange for its return to [name]. Phone: [phone], Toll-Free: [phone], Fax: [phone] Page: 1 of 2 Call Id: [id number] [facility] Night - Client [place] Patient Name: Laurie Sutton Gender: Female DOB: 1980-11-16 Age: 34 Y 45 M 7 D Return Phone Number: [phone] (Primary) [place] [address] Client [facility] Night - Client Client Site [facility] - Night Physician [place], [place] Type Call Call Type Triage / Clinical Relationship To Patient Self Return Phone Number [phone] (Primary) Chief Complaint Headache Initial Comment caller states that she has a viral infection.Marland Kitchen and she is not getting any relief... [place] Not Listed [facility] PreDisposition Home Care Nurse Assessment Nurse: Martyn Ehrich, RN, Solmon Ice Date/Time Eilene Ghazi Time): 07/13/2014 3:41:18 PM Confirm and document reason for call. If symptomatic, describe symptoms. ---PT says since Weds she has runny nose, congestion and Thurs and Fri cough was going into chest and facial pressure and HA and saw MD Fri am and he said it was viral and that throat was just pink. Today the face is hurting bad and HA is very bad. No fever Has the patient traveled out of the country within  the last 30 days? ---No Does the patient require triage? ---Yes Related visit to physician within the last 2 weeks? ---Yes Does the PT have any chronic conditions? (i.e. diabetes, asthma, etc.) ---Yes List chronic conditions. ---DM Did the patient indicate they were pregnant? ---No Guidelines Guideline Title Affirmed Question Affirmed Notes Nurse Date/Time (Eastern Time) Sinus Pain or Congestion [1] Redness or swelling on the cheek, forehead or around the eye AND [2] no fever Martyn Ehrich, RN, [facility] 07/17/4257 5:63:87 PM Disp. Time Eilene Ghazi Time) Disposition Final User 07/13/2014 3:46:08 PM See Physician within 4 Hours (or PCP triage) Yes Gaddy, RN, Felicia PLEASE NOTE: All timestamps contained within this report are represented as [place] Standard Time. CONFIDENTIALTY NOTICE: This fax transmission is intended only for the addressee. It contains information that is legally privileged, confidential or otherwise protected from use or disclosure. If you are not the intended recipient, you are strictly prohibited from reviewing, disclosing, copying using or disseminating any of this information or taking any action in reliance on or regarding this information. If you have received this fax in error, please notify us immediately by telephone so that we can arrange for its return to [name]. Phone: [phone], Toll-Free: [phone], Fax: [phone] Page: 2 of 2 Call Id: [id number] Leonard Understands: Yes Disagree/Comply: Comply Care Advice Given Per Guideline SEE PHYSICIAN WITHIN 4 HOURS (or PCP triage): * Another choice is to take 1,000 mg (two 500 mg pills) every 8 hours as needed. Each Extra Strength Tylenol pill has 500 mg of acetaminophen. The most you should take each day is 3,000 mg (6 Extra Strength pills a day). * Take 400 mg (two 200 mg pills) by mouth every 6 hours as needed. IBUPROFEN (E.G., MOTRIN, ADVIL): * Do not take nonsteroidal anti-inflammatory  drugs (NSAIDs) if you have stomach  problems, kidney disease, heart failure, or other contraindications to using this type of medication. * GASTROINTESTINAL RISK: There is an increased risk of stomach ulcers, GI bleeding, perforation. * CARDIOVASCULAR RISK: There may be an increased risk of heart attack and stroke. * Do not take NSAID medications if you are pregnant. * Do not take NSAID medications for over 7 days without consulting your PCP. * You may take this medicine with or without food. Taking it with food or milk may lessen the chance the drug will upset your stomach. * You become worse. After Care Instructions Given Call Event Type User Date / Time Description Comments User: Daphene Calamity, RN Date/Time (Eastern Time): 07/13/2014 3:43:21 PM HA level 7 Referrals GO TO FACILITY OTHER - SPECIFY GO TO FACILITY OTHER - SPECIFY

## 2014-07-17 NOTE — Telephone Encounter (Signed)
Left a message for pt to call office if further assistance is needed.

## 2014-07-23 ENCOUNTER — Other Ambulatory Visit: Payer: Self-pay | Admitting: Family Medicine

## 2014-07-23 MED ORDER — VENLAFAXINE HCL 75 MG PO TABS: ORAL_TABLET | ORAL | Status: DC

## 2014-07-23 NOTE — Telephone Encounter (Signed)
Pt called bc her rx venlafaxine (EFFEXOR) 75 MG tablet was not at pharm. [facility] and [facility] NOT receive [facility] rx venlafaxine (EFFEXOR) 75 MG tablet  BID / 90 day/ 3 mo supply.  [facility] [address] [facility] if [facility] could resend please. Pt is out.

## 2014-07-23 NOTE — Telephone Encounter (Signed)
rx sent in electronically 

## 2014-07-24 ENCOUNTER — Telehealth: Payer: Self-pay | Admitting: Family Medicine

## 2014-07-24 NOTE — Telephone Encounter (Signed)
Cut this down to 1/2 tablet (500 mg) twice a day and see if this helps

## 2014-07-24 NOTE — Telephone Encounter (Signed)
Pt states the  metFORMIN (GLUCOPHAGE) 1000 MG tablet is giving her diarrhea very bad. Needs to switch to something else. Last few days is worse. [facility] [address]

## 2014-07-25 NOTE — Telephone Encounter (Signed)
I left a voice message with below information.

## 2014-08-08 ENCOUNTER — Ambulatory Visit: Payer: BC Managed Care – PPO | Admitting: Nurse Practitioner

## 2014-09-17 ENCOUNTER — Encounter: Payer: Self-pay | Admitting: Family Medicine

## 2014-09-17 ENCOUNTER — Ambulatory Visit (INDEPENDENT_AMBULATORY_CARE_PROVIDER_SITE_OTHER): Payer: BLUE CROSS/BLUE SHIELD | Admitting: Family Medicine

## 2014-09-17 VITALS — BP 114/74 | HR 97 | Temp 97.9°F | Wt 209.7 lb

## 2014-09-17 DIAGNOSIS — E119 Type 2 diabetes mellitus without complications: Secondary | ICD-10-CM | POA: Diagnosis not present

## 2014-09-17 MED ORDER — SITAGLIPTIN PHOSPHATE 100 MG PO TABS: 100.0000 mg | ORAL_TABLET | Freq: Every day | ORAL | Status: DC

## 2014-09-17 NOTE — Progress Notes (Signed)
Pre visit review using our clinic review tool, if applicable. No additional management support is needed unless otherwise documented below in the visit note. 

## 2014-09-17 NOTE — Progress Notes (Signed)
   Subjective:    Patient ID: Laurie Sutton, female    DOB: 10/14/1980, 34 y.o.   MRN: [id number]  HPI Here to discuss issues with metformin. This has caused her constant diarrhea, even after she reduced the dose to 500 mg. After we reduced the dose her glucoses went up as expected, and she has been running from 127 to 140 in the mornings.    Review of Systems  Constitutional: Negative.   Respiratory: Negative.   Cardiovascular: Negative.   Gastrointestinal: Positive for diarrhea. Negative for nausea, vomiting, abdominal pain and abdominal distention.       Objective:   Physical Exam  Constitutional: She appears well-developed and well-nourished.  Cardiovascular: Normal rate, regular rhythm, normal heart sounds and intact distal pulses.   Pulmonary/Chest: Effort normal and breath sounds normal.  Abdominal: Soft. Bowel sounds are normal. She exhibits no distension. There is no tenderness. There is no rebound and no guarding.          Assessment & Plan:  We will stop Metformin completely and switch to Januvia 100 mg daily. She will return in 2 weeks

## 2014-09-24 ENCOUNTER — Telehealth: Payer: Self-pay | Admitting: Family Medicine

## 2014-09-24 ENCOUNTER — Other Ambulatory Visit: Payer: BLUE CROSS/BLUE SHIELD

## 2014-09-24 DIAGNOSIS — E119 Type 2 diabetes mellitus without complications: Secondary | ICD-10-CM

## 2014-09-24 NOTE — Telephone Encounter (Signed)
The test was ordered

## 2014-09-24 NOTE — Telephone Encounter (Signed)
Pt said she is due for a A1C check . If pt is due for this we need an order please.

## 2014-09-25 NOTE — Telephone Encounter (Signed)
Can you call pt to schedule lab appointment?

## 2014-09-27 ENCOUNTER — Other Ambulatory Visit: Payer: Self-pay | Admitting: Family Medicine

## 2014-10-01 ENCOUNTER — Ambulatory Visit: Payer: BLUE CROSS/BLUE SHIELD | Admitting: Family Medicine

## 2014-10-03 ENCOUNTER — Other Ambulatory Visit: Payer: BLUE CROSS/BLUE SHIELD

## 2014-10-09 ENCOUNTER — Ambulatory Visit: Payer: BLUE CROSS/BLUE SHIELD | Admitting: Family Medicine

## 2014-10-15 ENCOUNTER — Encounter: Payer: Self-pay | Admitting: Family Medicine

## 2014-10-15 ENCOUNTER — Ambulatory Visit (INDEPENDENT_AMBULATORY_CARE_PROVIDER_SITE_OTHER): Payer: BLUE CROSS/BLUE SHIELD | Admitting: Family Medicine

## 2014-10-15 VITALS — BP 119/79 | HR 90 | Temp 98.7°F | Ht 66.5 in | Wt 210.0 lb

## 2014-10-15 DIAGNOSIS — E119 Type 2 diabetes mellitus without complications: Secondary | ICD-10-CM | POA: Diagnosis not present

## 2014-10-15 DIAGNOSIS — J018 Other acute sinusitis: Secondary | ICD-10-CM

## 2014-10-15 DIAGNOSIS — G4700 Insomnia, unspecified: Secondary | ICD-10-CM

## 2014-10-15 MED ORDER — ZOLPIDEM TARTRATE 10 MG PO TABS: 10.0000 mg | ORAL_TABLET | Freq: Every evening | ORAL | Status: AC | PRN

## 2014-10-15 MED ORDER — CEFUROXIME AXETIL 500 MG PO TABS: 500.0000 mg | ORAL_TABLET | Freq: Two times a day (BID) | ORAL | Status: DC

## 2014-10-15 NOTE — Progress Notes (Signed)
   Subjective:    Patient ID: Laurie Sutton, female    DOB: 01/05/1981, 34 y.o.   MRN: [id number]  HPI Here for 2 things. First she has had trouble sleeping and the Belsomra she tried did not help. The only medication that has worked for her is Ambien, but she seemed to develop atolerance to this after awhile. Second for the past week she has had sinus pressure, PND, fever to 100.8 degrees, and a dry cough.   Review of Systems  Constitutional: Positive for fever.  HENT: Positive for congestion and postnasal drip.   Eyes: Negative.   Respiratory: Positive for cough.   Psychiatric/Behavioral: Positive for sleep disturbance. Negative for hallucinations, dysphoric mood and decreased concentration. The patient is not nervous/anxious.        Objective:   Physical Exam  Constitutional: She is oriented to person, place, and time. She appears well-developed and well-nourished.  HENT:  Right Ear: External ear normal.  Left Ear: External ear normal.  Nose: Nose normal.  Mouth/Throat: Oropharynx is clear and moist.  Eyes: Conjunctivae are normal.  Neck: No thyromegaly present.  Cardiovascular: Normal rate, regular rhythm, normal heart sounds and intact distal pulses.   Pulmonary/Chest: Effort normal and breath sounds normal.  Lymphadenopathy:    She has no cervical adenopathy.  Neurological: She is alert and oriented to person, place, and time.  Psychiatric: She has a normal mood and affect. Her behavior is normal. Thought content normal.          Assessment & Plan:  We will treat the sinusitis with Ceftin and Mucinex. She wants to stop Effexor because she does not need this anymore and I agreed. For one week she will take this once a day in the mornings only, and then stop. Try Ambien again for sleep, but this time take it only every 2 or 3 days, not every day. Hopefully this can reduce the chance of her becoming tolerant to it.

## 2014-10-15 NOTE — Progress Notes (Signed)
Pre visit review using our clinic review tool, if applicable. No additional management support is needed unless otherwise documented below in the visit note. 

## 2014-10-21 ENCOUNTER — Telehealth: Payer: Self-pay | Admitting: Family Medicine

## 2014-10-21 NOTE — Telephone Encounter (Signed)
Pt seen 7/5 dx w/ sinus inf.  Pt states she is not better. Congestion in chest, coughing up mucus.  Pt is sneezing more and feels more exhausted than before her appt. Just does not feel good  [facility]

## 2014-10-22 NOTE — Telephone Encounter (Signed)
Call in Levaquin 500 mg daily for 10 days  

## 2014-10-23 ENCOUNTER — Ambulatory Visit (INDEPENDENT_AMBULATORY_CARE_PROVIDER_SITE_OTHER): Payer: BLUE CROSS/BLUE SHIELD | Admitting: Family Medicine

## 2014-10-23 DIAGNOSIS — E119 Type 2 diabetes mellitus without complications: Secondary | ICD-10-CM

## 2014-10-23 MED ORDER — BENZONATATE 200 MG PO CAPS: 200.0000 mg | ORAL_CAPSULE | Freq: Two times a day (BID) | ORAL | Status: DC | PRN

## 2014-10-23 MED ORDER — LEVOFLOXACIN 500 MG PO TABS: 500.0000 mg | ORAL_TABLET | Freq: Every day | ORAL | Status: DC

## 2014-10-23 NOTE — Telephone Encounter (Signed)
I sent both scripts e-scribe and spoke with pt.

## 2014-10-23 NOTE — Telephone Encounter (Signed)
Per Dr. Sarajane Jews, also order Tessalon 200 mg take 1 po bid prn.

## 2014-10-24 NOTE — Progress Notes (Signed)
   Subjective:    Patient ID: Laurie Sutton, female    DOB: [birth year], 34 y.o.   MRN: [id number]  HPI    Review of Systems     Objective:   Physical Exam        Assessment & Plan:

## 2014-10-29 ENCOUNTER — Telehealth: Payer: Self-pay | Admitting: Family Medicine

## 2014-10-29 NOTE — Telephone Encounter (Signed)
Per Dr. Sarajane Jews, not sure if this is causing these symptoms or not. Pt advised to drink plenty of fluids and can call back if symptoms persist. I spoke with pt and went over directions.

## 2014-10-29 NOTE — Telephone Encounter (Signed)
I spoke with pt and she has been feeling shaky, no energy and achy, glucose level was 189. She has been sick for couple of weeks and not sure if this is the cause. She took last dose of Effexor about 4 days ago and she followed the directions from Dr. Sarajane Jews for tapering off of this medication.

## 2014-10-29 NOTE — Telephone Encounter (Signed)
Pt mother is calling and request sylvia to call her daughter he daughter is not feeling well. Pt mom would not elaborate

## 2014-10-31 ENCOUNTER — Other Ambulatory Visit: Payer: Self-pay | Admitting: Family Medicine

## 2014-10-31 ENCOUNTER — Other Ambulatory Visit: Payer: Self-pay | Admitting: Neurology

## 2014-11-01 ENCOUNTER — Other Ambulatory Visit: Payer: Self-pay | Admitting: Family Medicine

## 2014-11-11 ENCOUNTER — Telehealth: Payer: Self-pay | Admitting: Family Medicine

## 2014-11-11 MED ORDER — FLUCONAZOLE 150 MG PO TABS: 150.0000 mg | ORAL_TABLET | Freq: Once | ORAL | Status: DC

## 2014-11-11 NOTE — Telephone Encounter (Signed)
Call in  150 mg with 11 rf

## 2014-11-11 NOTE — Telephone Encounter (Signed)
I spoke with pt and is requesting a refill on Diflucan, she has vaginal itching, discharge and burning. [facility]?

## 2014-11-11 NOTE — Telephone Encounter (Signed)
I sent script e-scribe and left a voice message with this information for pt.

## 2014-11-11 NOTE — Telephone Encounter (Signed)
Pt is requesting sylvia to call her back its personal per pt and she did not elaborate

## 2014-11-18 ENCOUNTER — Telehealth: Payer: Self-pay | Admitting: Family Medicine

## 2014-11-18 NOTE — Telephone Encounter (Signed)
Pt would like to speak w/ you concerning her diabetic med. Pt sure it is making her body ache all over and and would like to switch back to her other med, Pt aware dr fry out, but states she cannot, will not, take this med.  [facility] [address]

## 2014-11-18 NOTE — Telephone Encounter (Signed)
I spoke with pt and she started back on Metformin, she still have some left. She is taking both Glipizide & Metformin, she stopped the Januvia because it was causing body aches all over. She has a order for A1c in computer and will get this drawn this week. She will need a new script for Metformin, pt is aware that Dr. Sarajane Jews is out of the office until 11/25/14. I did add Januvia to drug allergy list, per pt request and removed it from medication list.

## 2014-11-20 ENCOUNTER — Other Ambulatory Visit (INDEPENDENT_AMBULATORY_CARE_PROVIDER_SITE_OTHER): Payer: BLUE CROSS/BLUE SHIELD

## 2014-11-20 DIAGNOSIS — E119 Type 2 diabetes mellitus without complications: Secondary | ICD-10-CM

## 2014-11-20 LAB — HEMOGLOBIN A1C: Hgb A1c MFr Bld: 9.3 % — ABNORMAL HIGH (ref 4.6–6.5)

## 2014-11-25 NOTE — Telephone Encounter (Signed)
Pt called this am requesting an appt w/ dr fry to discuss her DM medicine and results of the [facility] done last wed. No appt until next Monday. Do you want to work pt in, or advise on a med . Pt is only on glipizide and BS has been up and down and as high as 200.  [place] is out of state today, but may be reached on cell phone [place] on connection issues[place]

## 2014-11-26 MED ORDER — METFORMIN HCL 500 MG PO TABS: 500.0000 mg | ORAL_TABLET | Freq: Two times a day (BID) | ORAL | Status: DC

## 2014-11-26 NOTE — Telephone Encounter (Signed)
See her result Note on the A1c. I have referred her to Endocrine already

## 2014-11-26 NOTE — Addendum Note (Signed)
Addended by: Alysia Penna A on: 11/26/2014 06:46 AM   Modules accepted: Orders

## 2014-11-26 NOTE — Telephone Encounter (Signed)
Per Dr. Sarajane Jews pt can start back on Metformin 500 mg take 1 po bid, she can take this until she see's Endocrinology.

## 2014-11-26 NOTE — Telephone Encounter (Signed)
I sent script e-scribe and spoke with Laurie Sutton. Dr. Sarajane Jews is aware that Laurie Sutton has this on drug allergy list, it caused Laurie Sutton to have diarrhea. I spoke with Laurie Sutton and she has been taking this again, she will follow up with endocrinology.

## 2014-12-02 ENCOUNTER — Ambulatory Visit: Payer: BLUE CROSS/BLUE SHIELD | Admitting: Family Medicine

## 2014-12-16 ENCOUNTER — Other Ambulatory Visit: Payer: Self-pay | Admitting: Neurology

## 2014-12-21 ENCOUNTER — Encounter (HOSPITAL_COMMUNITY): Payer: Self-pay | Admitting: *Deleted

## 2014-12-21 ENCOUNTER — Inpatient Hospital Stay (HOSPITAL_COMMUNITY)
Admission: AD | Admit: 2014-12-21 | Discharge: 2014-12-21 | Disposition: A | Discharge status: Home / Self Care | Payer: BLUE CROSS/BLUE SHIELD | Source: Ambulatory Visit | Attending: Emergency Medicine | Admitting: Emergency Medicine

## 2014-12-21 ENCOUNTER — Inpatient Hospital Stay (HOSPITAL_COMMUNITY): Payer: BLUE CROSS/BLUE SHIELD

## 2014-12-21 DIAGNOSIS — F329 Major depressive disorder, single episode, unspecified: Secondary | ICD-10-CM | POA: Insufficient documentation

## 2014-12-21 DIAGNOSIS — Z8489 Family history of other specified conditions: Secondary | ICD-10-CM | POA: Insufficient documentation

## 2014-12-21 DIAGNOSIS — F419 Anxiety disorder, unspecified: Secondary | ICD-10-CM | POA: Insufficient documentation

## 2014-12-21 DIAGNOSIS — K219 Gastro-esophageal reflux disease without esophagitis: Secondary | ICD-10-CM | POA: Insufficient documentation

## 2014-12-21 DIAGNOSIS — R748 Abnormal levels of other serum enzymes: Secondary | ICD-10-CM

## 2014-12-21 DIAGNOSIS — R109 Unspecified abdominal pain: Secondary | ICD-10-CM

## 2014-12-21 DIAGNOSIS — N201 Calculus of ureter: Secondary | ICD-10-CM | POA: Diagnosis not present

## 2014-12-21 DIAGNOSIS — E119 Type 2 diabetes mellitus without complications: Secondary | ICD-10-CM | POA: Insufficient documentation

## 2014-12-21 DIAGNOSIS — R1031 Right lower quadrant pain: Secondary | ICD-10-CM | POA: Diagnosis present

## 2014-12-21 DIAGNOSIS — Z823 Family history of stroke: Secondary | ICD-10-CM | POA: Diagnosis not present

## 2014-12-21 LAB — POCT PREGNANCY, URINE: Preg Test, Ur: NEGATIVE

## 2014-12-21 LAB — COMPREHENSIVE METABOLIC PANEL
ALT: 108 U/L — ABNORMAL HIGH (ref 14–54)
AST: 132 U/L — AB (ref 15–41)
Albumin: 4 g/dL (ref 3.5–5.0)
Alkaline Phosphatase: 83 U/L (ref 38–126)
Anion gap: 11 (ref 5–15)
BUN: 9 mg/dL (ref 6–20)
CHLORIDE: 105 mmol/L (ref 101–111)
CO2: 22 mmol/L (ref 22–32)
Calcium: 9.2 mg/dL (ref 8.9–10.3)
Creatinine, Ser: 0.58 mg/dL (ref 0.44–1.00)
Glucose, Bld: 146 mg/dL — ABNORMAL HIGH (ref 65–99)
POTASSIUM: 3.7 mmol/L (ref 3.5–5.1)
Sodium: 138 mmol/L (ref 135–145)
Total Bilirubin: 0.6 mg/dL (ref 0.3–1.2)
Total Protein: 7.3 g/dL (ref 6.5–8.1)

## 2014-12-21 LAB — URINALYSIS, ROUTINE W REFLEX MICROSCOPIC
Glucose, UA: NEGATIVE mg/dL
KETONES UR: 15 mg/dL — AB
NITRITE: NEGATIVE
PH: 5.5 (ref 5.0–8.0)
Protein, ur: 100 mg/dL — AB
Specific Gravity, Urine: >1.03 — ABNORMAL HIGH (ref 1.005–1.030)
UROBILINOGEN UA: 1 mg/dL (ref 0.0–1.0)

## 2014-12-21 LAB — CBC
HCT: 27.9 % — ABNORMAL LOW (ref 36.0–46.0)
Hemoglobin: 7.9 g/dL — ABNORMAL LOW (ref 12.0–15.0)
MCH: 21.1 pg — ABNORMAL LOW (ref 26.0–34.0)
MCHC: 28.3 g/dL — ABNORMAL LOW (ref 30.0–36.0)
MCV: 74.4 fL — AB (ref 78.0–100.0)
PLATELETS: 285 10*3/uL (ref 150–400)
RBC: 3.75 MIL/uL — ABNORMAL LOW (ref 3.87–5.11)
RDW: 17.2 % — AB (ref 11.5–15.5)
WBC: 6 10*3/uL (ref 4.0–10.5)

## 2014-12-21 LAB — POC OCCULT BLOOD, ED: Fecal Occult Bld: NEGATIVE

## 2014-12-21 LAB — URINE MICROSCOPIC-ADD ON

## 2014-12-21 LAB — AMYLASE: AMYLASE: 41 U/L (ref 28–100)

## 2014-12-21 LAB — LIPASE, BLOOD: LIPASE: 24 U/L (ref 22–51)

## 2014-12-21 MED ORDER — TAMSULOSIN HCL 0.4 MG PO CAPS: 0.4000 mg | ORAL_CAPSULE | Freq: Two times a day (BID) | ORAL | Status: DC

## 2014-12-21 MED ORDER — ONDANSETRON 4 MG PO TBDP: ORAL_TABLET | ORAL | Status: DC

## 2014-12-21 MED ORDER — OXYCODONE-ACETAMINOPHEN 5-325 MG PO TABS
1.0000 | ORAL_TABLET | Freq: Once | ORAL | Status: AC
  Administered 2014-12-21: 1 via ORAL
  Filled 2014-12-21: qty 1

## 2014-12-21 MED ORDER — IOHEXOL 300 MG/ML  SOLN
100.0000 mL | Freq: Once | INTRAMUSCULAR | Status: AC | PRN
  Administered 2014-12-21: 100 mL via INTRAVENOUS

## 2014-12-21 MED ORDER — LACTATED RINGERS IV SOLN
INTRAVENOUS | Status: DC
  Administered 2014-12-21: 14:00:00 via INTRAVENOUS

## 2014-12-21 MED ORDER — KETOROLAC TROMETHAMINE 60 MG/2ML IM SOLN
60.0000 mg | Freq: Once | INTRAMUSCULAR | Status: AC
  Administered 2014-12-21: 60 mg via INTRAMUSCULAR
  Filled 2014-12-21: qty 2

## 2014-12-21 MED ORDER — FENTANYL CITRATE (PF) 100 MCG/2ML IJ SOLN
50.0000 ug | Freq: Once | INTRAMUSCULAR | Status: AC
  Administered 2014-12-21: 50 ug via INTRAVENOUS
  Filled 2014-12-21: qty 2

## 2014-12-21 MED ORDER — OXYCODONE-ACETAMINOPHEN 5-325 MG PO TABS: 1.0000 | ORAL_TABLET | ORAL | Status: DC | PRN

## 2014-12-21 NOTE — ED Notes (Signed)
Pt presents with RLQ abdominal pain x1 day with nausea. Transferred from [facility] to rule out appendicitis.

## 2014-12-21 NOTE — MAU Note (Signed)
Report called to Chong Sicilian, RN at [facility] ED.

## 2014-12-21 NOTE — ED Provider Notes (Signed)
CSN: [id number]     Arrival date & time 12/21/14  1010 History   First MD Initiated Contact with Patient 12/21/14 1519     Chief Complaint  Patient presents with  . Abdominal Pain     (Consider location/radiation/quality/duration/timing/severity/associated sxs/prior Treatment) HPI NOEMY HALLMON is a 34 y.o. female with history of diabetes, C-section, comes in for evaluation of abdominal pain. [facility] for further evaluation of abdominal pain. Patient has had sudden onset, right-sided abdominal pain since yesterday at approximately 10:00 AM. She characterizes the discomfort as a cramping sensation, waxing and waning, and radiates from right lower abdomen into her right upper abdomen and sometimes into her right back. She denies fevers, chills, dysuria, hematuria. She reports associated nausea, but Laurie Sutton vomiting. [facility] and was evaluated by her OB, had pelvic ultrasound done in order to rule out ovarian pathology. Ultrasound was negative and patient was transferred to the ED for CT scan. Patient reports abdominal discomfort now in her right upper quadrant that she rates as a 1/10. She reports that laying down and will sometimes make it better, but standing up definitely makes it worse. Laurie Sutton other modifying factors. Laurie Sutton history of kidney stones. Does have a history of gallstones.  Past Medical History  Diagnosis Date  . Gynecological examination     sees Dr. Josefa Half  . Migraines   . GERD (gastroesophageal reflux disease)   . Murmur, cardiac   . Anxiety   . Depression   . Diabetes mellitus without complication    Past Surgical History  Procedure Laterality Date  . Cesarean section     Family History  Problem Relation Age of Onset  . Hyperlipidemia Mother   . Stroke Mother   . Diabetes Mother   . Migraines Mother   . Migraines Maternal Aunt   . Migraines Maternal Grandmother   . Cerebral aneurysm  Cousin    Social History  Substance Use Topics  . Smoking status: Never Smoker   . Smokeless tobacco: Never Used  . Alcohol Use: Laurie Sutton   OB History    Gravida Para Term Preterm AB TAB SAB Ectopic Multiple Living   2 2 2       2      Review of Systems A 10 point review of systems was completed and was negative except for pertinent positives and negatives as mentioned in the history of present illness     Allergies  Amoxicillin; Januvia; and Penicillins  Home Medications   Prior to Admission medications   Medication Sig Start Date End Date Taking? Authorizing Provider  glipiZIDE (GLUCOTROL) 10 MG tablet TAKE 1 TABLET BY MOUTH TWICE A DAY BEFORE A MEAL 07/16/14  Yes Laurey Morale, MD  glipiZIDE (GLUCOTROL) 10 MG tablet TAKE 1 TABLET (10 MG TOTAL) BY MOUTH 2 (TWO) TIMES DAILY BEFORE A MEAL. 11/01/14  Yes Laurey Morale, MD  glucose blood [facility] VERIO) test strip Use as instructed 11/23/13  Yes Laurey Morale, MD  losartan (COZAAR) 50 MG tablet Take 1 tablet (50 mg total) by mouth daily. 01/22/14  Yes Laurey Morale, MD  metFORMIN (GLUCOPHAGE) 500 MG tablet Take 1 tablet (500 mg total) by mouth 2 (two) times daily with a meal. 11/26/14  Yes Laurey Morale, MD  omeprazole (PRILOSEC) 20 MG capsule Take 20 mg by mouth daily.   Yes Historical Provider, MD  ONE TOUCH LANCETS MISC Test once per  day and diagnosis code is 250.00 11/23/13  Yes Laurey Morale, MD  propranolol (INDERAL) 20 MG tablet TAKE 1 TABLET BY MOUTH TWICE A DAY 12/16/14  Yes Kathrynn Ducking, MD  traMADol (ULTRAM) 50 MG tablet Take 50 mg by mouth every 6 (six) hours as needed for moderate pain.   Yes Historical Provider, MD  benzonatate (TESSALON) 200 MG capsule Take 1 capsule (200 mg total) by mouth 2 (two) times daily as needed for cough. Patient not taking: Reported on 12/21/2014 10/23/14   Laurey Morale, MD  cefUROXime (CEFTIN) 500 MG tablet Take 1 tablet (500 mg total) by mouth 2 (two) times daily with a meal. Patient not taking:  Reported on 12/21/2014 10/15/14   Laurey Morale, MD  esomeprazole (NEXIUM) 40 MG packet Take 40 mg by mouth 2 (two) times daily before a meal. Patient not taking: Reported on 12/21/2014 05/20/14   Milus Banister, MD  fluconazole (DIFLUCAN) 150 MG tablet Take 1 tablet (150 mg total) by mouth once. Patient not taking: Reported on 12/21/2014 11/11/14   Laurey Morale, MD  levofloxacin (LEVAQUIN) 500 MG tablet Take 1 tablet (500 mg total) by mouth daily. Patient not taking: Reported on 12/21/2014 10/23/14   Laurey Morale, MD  ondansetron [facility] ODT) 4 MG disintegrating tablet 16m ODT q4 hours prn nausea/vomit 12/21/14   BComer Locket PA-C  oxyCODONE-acetaminophen (PERCOCET/ROXICET) 5-325 MG per tablet Take 1-2 tablets by mouth every 4 (four) hours as needed for severe pain. 12/21/14   BComer Locket PA-C  rizatriptan (MAXALT) 10 MG tablet Take 1 tablet (10 mg total) by mouth 3 (three) times daily as needed. 02/06/14   CKathrynn Ducking MD  tamsulosin (FLOMAX) 0.4 MG CAPS capsule Take 1 capsule (0.4 mg total) by mouth 2 (two) times daily. 12/21/14   Sahith Nurse, PA-C   BP 124/64 mmHg  Pulse 76  Temp(Src) 98.2 F (36.8 C) (Oral)  Resp 18  Ht 5' 6.5" (1.689 m)  Wt 210 lb (95.255 kg)  BMI 33.39 kg/m2  SpO2 98%  LMP 12/13/2014 (Approximate) Physical Exam  Constitutional: She is oriented to person, place, and time. She appears well-developed and well-nourished.  HENT:  Head: Normocephalic and atraumatic.  Mouth/Throat: Oropharynx is clear and moist.  Eyes: Conjunctivae are normal. Pupils are equal, round, and reactive to light. Right eye exhibits Laurie Sutton discharge. Left eye exhibits Laurie Sutton discharge. Laurie Sutton scleral icterus.  Neck: Neck supple.  Cardiovascular: Normal rate, regular rhythm and normal heart sounds.   Pulmonary/Chest: Effort normal and breath sounds normal. Laurie Sutton respiratory distress. She has Laurie Sutton wheezes. She has Laurie Sutton rales.  Abdominal: Soft. There is Laurie Sutton tenderness.  Mild tenderness right upper  quadrant with deep palpation. Patient is overweight with large abdomen, but Laurie Sutton overt or unusual abdominal distention. Laurie Sutton other abdominal tenderness, rebound or guarding. Laurie Sutton lesions or deformities noted. Laurie Sutton CVA tenderness  Musculoskeletal: She exhibits Laurie Sutton tenderness.  Neurological: She is alert and oriented to person, place, and time.  Cranial Nerves II-XII grossly intact  Skin: Skin is warm and dry. Laurie Sutton rash noted.  Psychiatric: She has a normal mood and affect.  Nursing note and vitals reviewed.   ED Course  Procedures (including critical care time) Labs Review Labs Reviewed  URINALYSIS, ROUTINE W REFLEX [facility]COPIC [facility] - Abnormal; Notable for the following:    Color, Urine AMBER (*)    APPearance HAZY (*)    Specific Gravity, Urine >1.030 (*)    Hgb urine dipstick LARGE (*)  Bilirubin Urine SMALL (*)    Ketones, ur 15 (*)    Protein, ur 100 (*)    Leukocytes, UA TRACE (*)    All other components within normal limits  CBC - Abnormal; Notable for the following:    RBC 3.75 (*)    Hemoglobin 7.9 (*)    HCT 27.9 (*)    MCV 74.4 (*)    MCH 21.1 (*)    MCHC 28.3 (*)    RDW 17.2 (*)    All other components within normal limits  COMPREHENSIVE METABOLIC PANEL - Abnormal; Notable for the following:    Glucose, Bld 146 (*)    AST 132 (*)    ALT 108 (*)    All other components within normal limits  URINE MICROSCOPIC-ADD ON - Abnormal; Notable for the following:    Squamous Epithelial / LPF FEW (*)    All other components within normal limits  AMYLASE  LIPASE, BLOOD  POCT PREGNANCY, URINE  POC OCCULT BLOOD, ED    Imaging Review US Abdomen Complete  12/21/2014   CLINICAL DATA:  Right sided abdominal pain since 12/20/2014. Elevated LFTs.  EXAM: ULTRASOUND ABDOMEN COMPLETE  COMPARISON:  01/16/2014  FINDINGS: Gallbladder: Laurie Sutton gallstones or wall thickening visualized. Laurie Sutton sonographic Murphy sign noted.  Common bile duct: Diameter: 4 mm  Liver: Diffusely increased  echogenicity and enlarged with a length of 22.4 cm.  IVC: Laurie Sutton abnormality visualized.  Pancreas: Visualized portion unremarkable.  Spleen: Enlarged, with a calculated volume of 652 cc.  Right Kidney: Length: 12.9 cm. Echogenicity within normal limits. Laurie Sutton mass or hydronephrosis visualized.  Left Kidney: Length: 13.1 cm. Echogenicity within normal limits. Laurie Sutton mass or hydronephrosis visualized.  Abdominal aorta: Laurie Sutton aneurysm visualized.  Other findings: None.  IMPRESSION: 1. Laurie Sutton acute abnormality identified. 2. Enlarged liver with increased echogenicity suggestive of steatosis. 3. Splenomegaly.   Electronically Signed   By: Logan Bores M.D.   On: 12/21/2014 17:27   US Transvaginal Non-ob  12/21/2014   CLINICAL DATA:  Right lower quadrant pain  EXAM: TRANSABDOMINAL AND TRANSVAGINAL ULTRASOUND OF PELVIS  DOPPLER ULTRASOUND OF OVARIES  TECHNIQUE: Both transabdominal and transvaginal ultrasound examinations of the pelvis were performed. Transabdominal technique was performed for global imaging of the pelvis including uterus, ovaries, adnexal regions, and pelvic cul-de-sac.  It was necessary to proceed with endovaginal exam following the transabdominal exam to visualize the ovaries. Color and duplex Doppler ultrasound was utilized to evaluate blood flow to the ovaries.  COMPARISON:  None.  FINDINGS: Uterus  Measurements: 7.7 x 4.0 x 5.2 cm. The myometrium appears heterogeneous. Laurie Sutton fibroids or other mass visualized.  Endometrium  Thickness: 1.3 mm.  Laurie Sutton focal abnormality visualized.  Right ovary  Measurements: 4.5 x 2.5 x 3.7 cm. Normal appearance/Laurie Sutton adnexal mass.  Left ovary  Measurements: 4.0 x 2.7 x 3.7 cm. Normal appearance/Laurie Sutton adnexal mass.  Pulsed Doppler evaluation of both ovaries demonstrates normal low-resistance arterial and venous waveforms.  Other findings  Laurie Sutton free fluid.  IMPRESSION: 1. Laurie Sutton evidence for ovarian torsion.   Electronically Signed   By: Kerby Moors M.D.   On: 12/21/2014 12:18   US Pelvis  Complete  12/21/2014   CLINICAL DATA:  Right lower quadrant pain  EXAM: TRANSABDOMINAL AND TRANSVAGINAL ULTRASOUND OF PELVIS  DOPPLER ULTRASOUND OF OVARIES  TECHNIQUE: Both transabdominal and transvaginal ultrasound examinations of the pelvis were performed. Transabdominal technique was performed for global imaging of the pelvis including uterus, ovaries, adnexal regions, and pelvic cul-de-sac.  It was  necessary to proceed with endovaginal exam following the transabdominal exam to visualize the ovaries. Color and duplex Doppler ultrasound was utilized to evaluate blood flow to the ovaries.  COMPARISON:  None.  FINDINGS: Uterus  Measurements: 7.7 x 4.0 x 5.2 cm. The myometrium appears heterogeneous. Laurie Sutton fibroids or other mass visualized.  Endometrium  Thickness: 1.3 mm.  Laurie Sutton focal abnormality visualized.  Right ovary  Measurements: 4.5 x 2.5 x 3.7 cm. Normal appearance/Laurie Sutton adnexal mass.  Left ovary  Measurements: 4.0 x 2.7 x 3.7 cm. Normal appearance/Laurie Sutton adnexal mass.  Pulsed Doppler evaluation of both ovaries demonstrates normal low-resistance arterial and venous waveforms.  Other findings  Laurie Sutton free fluid.  IMPRESSION: 1. Laurie Sutton evidence for ovarian torsion.   Electronically Signed   By: Kerby Moors M.D.   On: 12/21/2014 12:18   Ct Abdomen Pelvis W Contrast  12/21/2014   CLINICAL DATA:  Acute right lower quadrant abdominal pain.  EXAM: CT ABDOMEN AND PELVIS WITH CONTRAST  TECHNIQUE: Multidetector CT imaging of the abdomen and pelvis was performed using the standard protocol following bolus administration of intravenous contrast.  CONTRAST:  18m OMNIPAQUE IOHEXOL 300 MG/ML  SOLN  COMPARISON:  Ultrasound of same day.  FINDINGS: Visualized lung bases appear normal. Laurie Sutton significant osseous abnormality is noted.  Laurie Sutton gallstones are noted. Fatty infiltration of the liver is noted. The spleen and pancreas appear normal. Adrenal glands appear normal. Left kidney and ureter appear normal. Minimal right hydronephrosis is noted  secondary to 10 x 4 mm calculus at the ureteropelvic junction. The appendix appears normal. There is Laurie Sutton evidence of bowel obstruction. Laurie Sutton abnormal fluid collection is noted. Uterus and ovaries appear normal. Urinary bladder appears normal. Laurie Sutton significant adenopathy is noted.  IMPRESSION: Fatty infiltration of the liver.  Minimal right hydronephrosis secondary to 10 x 4 mm calculus at the right ureteropelvic junction.   Electronically Signed   By: JMarijo Conception M.D.   On: 12/21/2014 18:46   UKoreaArt/ven Flow Abd Pelv Doppler  12/21/2014   CLINICAL DATA:  Right lower quadrant pain  EXAM: TRANSABDOMINAL AND TRANSVAGINAL ULTRASOUND OF PELVIS  DOPPLER ULTRASOUND OF OVARIES  TECHNIQUE: Both transabdominal and transvaginal ultrasound examinations of the pelvis were performed. Transabdominal technique was performed for global imaging of the pelvis including uterus, ovaries, adnexal regions, and pelvic cul-de-sac.  It was necessary to proceed with endovaginal exam following the transabdominal exam to visualize the ovaries. Color and duplex Doppler ultrasound was utilized to evaluate blood flow to the ovaries.  COMPARISON:  None.  FINDINGS: Uterus  Measurements: 7.7 x 4.0 x 5.2 cm. The myometrium appears heterogeneous. Laurie Sutton fibroids or other mass visualized.  Endometrium  Thickness: 1.3 mm.  Laurie Sutton focal abnormality visualized.  Right ovary  Measurements: 4.5 x 2.5 x 3.7 cm. Normal appearance/Laurie Sutton adnexal mass.  Left ovary  Measurements: 4.0 x 2.7 x 3.7 cm. Normal appearance/Laurie Sutton adnexal mass.  Pulsed Doppler evaluation of both ovaries demonstrates normal low-resistance arterial and venous waveforms.  Other findings  Laurie Sutton free fluid.  IMPRESSION: 1. Laurie Sutton evidence for ovarian torsion.   Electronically Signed   By: TKerby MoorsM.D.   On: 12/21/2014 12:18   I have personally reviewed and evaluated these images and lab results as part of my medical decision-making.   EKG Interpretation None     Meds given in ED:  Medications   lactated ringers infusion ( Intravenous New Bag/Given 12/21/14 1343)  oxyCODONE-acetaminophen (PERCOCET/ROXICET) 5-325 MG per tablet 1 tablet (1 tablet Oral Given 12/21/14 1111)  ketorolac (TORADOL) injection 60 mg (60 mg Intramuscular Given 12/21/14 1110)  fentaNYL (SUBLIMAZE) injection 50 mcg (50 mcg Intravenous Given 12/21/14 1712)  iohexol (OMNIPAQUE) 300 MG/ML solution 100 mL (100 mLs Intravenous Contrast Given 12/21/14 1745)    New Prescriptions   ONDANSETRON (ZOFRAN ODT) 4 MG DISINTEGRATING TABLET    65m ODT q4 hours prn nausea/vomit   OXYCODONE-ACETAMINOPHEN (PERCOCET/ROXICET) 5-325 MG PER TABLET    Take 1-2 tablets by mouth every 4 (four) hours as needed for severe pain.   TAMSULOSIN (FLOMAX) 0.4 MG CAPS CAPSULE    Take 1 capsule (0.4 mg total) by mouth 2 (two) times daily.   Filed Vitals:   12/21/14 1221 12/21/14 1442 12/21/14 1516 12/21/14 1705  BP: 119/62 108/63 130/71 124/64  Pulse: 69 68 70 76  Temp:  97.7 F (36.5 C) 98.2 F (36.8 C) 98.2 F (36.8 C)  TempSrc:  Oral Oral Oral  Resp: 16 16 18 18   Height:      Weight:      SpO2:  99% 98% 98%    MDM  Vitals stable - WNL -afebrile Pt resting comfortably in ED. PE--very mild right upper quadrant tenderness with palpation. Laurie Sutton peritoneal signs or other evidence of surgical abdomen. Physical exam is otherwise grossly unremarkable. Labwork--hemoglobin is 7.9, Hemoccult-negative. Increase in AST and ALT to 132 and 108 respectively. Urine shows evidence of dehydration with specific gravity greater than 1.03 and 15 ketones. Creatinine 0.58 Labs otherwise noncontributory. Imaging--previous pelvic ultrasound is unremarkable for any ovarian pathology. Right upper quadrant ultrasound negative, there is Laurie Sutton enlarged liver with increased echogenicity suggestive of steatosis. CT abdomen shows minimal right hydronephrosis secondary to a 10 x 4 mm calculus at right ureteropelvic junction  DDX--upon further questioning of hemoglobin, patient  reports recently having heavy menstrual cycle and passing large amounts of clots, followed by her OB for this problem. Patient also states she has a urologist she will be able to follow up with next week. Stressed the importance of f/u as this stone is large and will not pass without intervention and pt understands. Patient is tolerating oral fluids in the ED without difficulty. Laurie Sutton evidence of urosepsis, infected stone or renal failure. Will DC with pain medicines, antiemetics, tamsulosin. I discussed all relevant lab findings and imaging results with pt and they verbalized understanding. Discussed f/u with PCP within 48 hrs and return precautions, pt very amenable to plan.  Final diagnoses:  Elevated liver enzymes  Right ureteral stone        BComer Locket PA-C 12/21/14 2105  DPattricia Boss MD 12/22/14 [id number]

## 2014-12-21 NOTE — MAU Note (Signed)
Patient states she has been having right sided abdominal pain "near my ovary" since yesterday.  She called her Dr. Gabriel Carina yesterday and was prescribed Tramadol for pain which she states helped, but has just "dulled the pain."  Patient does not think she is pregnant.  States pain 8-9/10.

## 2014-12-21 NOTE — Discharge Instructions (Signed)
You were evaluated for abdominal pain. You were found to have a right-sided kidney stone. Please follow-up with your urologist for definitive care for this problem. Take your pain medicines as prescribed. Take your antinausea medicines as needed. Return to ED for new or worsening symptoms including fever, intractable nausea and vomiting, worsening pain.  Abdominal Pain, Women Abdominal (stomach, pelvic, or belly) pain can be caused by many things. It is important to tell your doctor:  The location of the pain.  Does it come and go or is it present all the time?  Are there things that start the pain (eating certain foods, exercise)?  Are there other symptoms associated with the pain (fever, nausea, vomiting, diarrhea)? All of this is helpful to know when trying to find the cause of the pain. CAUSES   Stomach: virus or bacteria infection, or ulcer.  Intestine: appendicitis (inflamed appendix), regional ileitis (Crohn's disease), ulcerative colitis (inflamed colon), irritable bowel syndrome, diverticulitis (inflamed diverticulum of the colon), or cancer of the stomach or intestine.  Gallbladder disease or stones in the gallbladder.  Kidney disease, kidney stones, or infection.  Pancreas infection or cancer.  Fibromyalgia (pain disorder).  Diseases of the female organs:  Uterus: fibroid (non-cancerous) tumors or infection.  Fallopian tubes: infection or tubal pregnancy.  Ovary: cysts or tumors.  Pelvic adhesions (scar tissue).  Endometriosis (uterus lining tissue growing in the pelvis and on the pelvic organs).  Pelvic congestion syndrome (female organs filling up with blood just before the menstrual period).  Pain with the menstrual period.  Pain with ovulation (producing an egg).  Pain with an IUD (intrauterine device, birth control) in the uterus.  Cancer of the female organs.  Functional pain (pain not caused by a disease, may improve without  treatment).  Psychological pain.  Depression. DIAGNOSIS  Your doctor will decide the seriousness of your pain by doing an examination.  Blood tests.  X-rays.  Ultrasound.  CT scan (computed tomography, special type of X-ray).  MRI (magnetic resonance imaging).  Cultures, for infection.  Barium enema (dye inserted in the large intestine, to better view it with X-rays).  Colonoscopy (looking in intestine with a lighted tube).  Laparoscopy (minor surgery, looking in abdomen with a lighted tube).  Major abdominal exploratory surgery (looking in abdomen with a large incision). TREATMENT  The treatment will depend on the cause of the pain.   Many cases can be observed and treated at home.  Over-the-counter medicines recommended by your caregiver.  Prescription medicine.  Antibiotics, for infection.  Birth control pills, for painful periods or for ovulation pain.  Hormone treatment, for endometriosis.  Nerve blocking injections.  Physical therapy.  Antidepressants.  Counseling with a psychologist or psychiatrist.  Minor or major surgery. HOME CARE INSTRUCTIONS   Do not take laxatives, unless directed by your caregiver.  Take over-the-counter pain medicine only if ordered by your caregiver. Do not take aspirin because it can cause an upset stomach or bleeding.  Try a clear liquid diet (broth or water) as ordered by your caregiver. Slowly move to a bland diet, as tolerated, if the pain is related to the stomach or intestine.  Have a thermometer and take your temperature several times a day, and record it.  Bed rest and sleep, if it helps the pain.  Avoid sexual intercourse, if it causes pain.  Avoid stressful situations.  Keep your follow-up appointments and tests, as your caregiver orders.  If the pain does not go away with medicine or surgery,  you may try:  Acupuncture.  Relaxation exercises (yoga, meditation).  Group therapy.  Counseling. SEEK  MEDICAL CARE IF:   You notice certain foods cause stomach pain.  Your home care treatment is not helping your pain.  You need stronger pain medicine.  You want your IUD removed.  You feel faint or lightheaded.  You develop nausea and vomiting.  You develop a rash.  You are having side effects or an allergy to your medicine. SEEK IMMEDIATE MEDICAL CARE IF:   Your pain does not go away or gets worse.  You have a fever.  Your pain is felt only in portions of the abdomen. The right side could possibly be appendicitis. The left lower portion of the abdomen could be colitis or diverticulitis.  You are passing blood in your stools (bright red or black tarry stools, with or without vomiting).  You have blood in your urine.  You develop chills, with or without a fever.  You pass out. MAKE SURE YOU:   Understand these instructions.  Will watch your condition.  Will get help right away if you are not doing well or get worse. Document Released: 01/24/2007 Document Revised: 08/13/2013 Document Reviewed: 02/13/2009 [facility] Patient Information 2015 [place], [place]. This information is not intended to replace advice given to you by your health care provider. Make sure you discuss any questions you have with your health care provider.  Lithotripsy for Kidney Stones Lithotripsy is a treatment that can sometimes help eliminate kidney stones and pain that they cause. A form of lithotripsy, also known as extracorporeal shock wave lithotripsy, is a nonsurgical procedure that helps your body rid itself of the kidney stone when it is too big to pass on its own. Extracorporeal shock wave lithotripsy is a method of crushing a kidney stone with shock waves. These shock waves pass through your body and are focused on your stone. They cause the kidney stones to crumble while still in the urinary tract. It is then easier for the smaller pieces of stone to pass in the urine. Lithotripsy usually takes  about an hour. It is done in a hospital, a lithotripsy center, or a mobile unit. It usually does not require an overnight stay. Your health care provider will instruct you on preparation for the procedure. Your health care provider will tell you what to expect afterward. LET [facility] CARE PROVIDER KNOW ABOUT:  Any allergies you have.  All medicines you are taking, including vitamins, herbs, eye drops, creams, and over-the-counter medicines.  Previous problems you or members of your family have had with the use of anesthetics.  Any blood disorders you have.  Previous surgeries you have had.  Medical conditions you have. RISKS AND COMPLICATIONS Generally, lithotripsy for kidney stones is a safe procedure. However, as with any procedure, complications can occur. Possible complications include:  Infection.  Bleeding of the kidney.  Bruising of the kidney or skin.  Obstruction of the ureter.  Failure of the stone to fragment. BEFORE THE PROCEDURE  Do not eat or drink for 6-8 hours prior to the procedure. You may, however, take the medications with a sip of water that your physician instructs you to take  Do not take aspirin or aspirin-containing products for 7 days prior to your procedure  Do not take nonsteroidal anti-inflammatory products for 7 days prior to your procedure PROCEDURE A stent (flexible tube with holes) may be placed in your ureter. The ureter is the tube that transports the urine from the  kidneys to the bladder. Your health care provider may place a stent before the procedure. This will help keep urine flowing from the kidney if the fragments of the stone block the ureter. You may have an IV tube placed in one of your veins to give you fluids and medicines. These medicines may help you relax or make you sleep. During the procedure, you will lie comfortably on a fluid-filled cushion or in a warm-water bath. After an X-ray or ultrasound exam to locate your stone, shock  waves are aimed at the stone. If you are awake, you may feel a tapping sensation as the shock waves pass through your body. If large stone particles remain after treatment, a second procedure may be necessary at a later date. For comfort during the test:  Relax as much as possible.  Try to remain still as much as possible.  Try to follow instructions to speed up the test.  Let your health care provider know if you are uncomfortable, anxious, or in pain. AFTER THE PROCEDURE  After surgery, you will be taken to the recovery area. A nurse will watch and check your progress. Once you're awake, stable, and taking fluids well, you will be allowed to go home as long as there are no problems. You will also be allowed to pass your urine before discharge.You may be given antibiotics to help prevent infection. You may also be prescribed pain medicine if needed. In a week or two, your health care provider may remove your stent, if you have one. You may first have an X-ray exam to check on how successful the fragmentation of your stone has been and how much of the stone has passed. Your health care provider will check to see whether or not stone particles remain. SEEK IMMEDIATE MEDICAL CARE IF:  You develop a fever or shaking chills.  Your pain is not relieved by medicine.  You feel sick to your stomach (nauseated) and you vomit.  You develop heavy bleeding.  You have difficulty urinating.  You start to pass your stent from your penis. Document Released: 03/26/2000 Document Revised: 01/17/2013 Document Reviewed: 10/12/2012 [facility] Patient Information 2015 [place], [place]. This information is not intended to replace advice given to you by your health care provider. Make sure you discuss any questions you have with your health care provider.  Kidney Stones Kidney stones (urolithiasis) are deposits that form inside your kidneys. The intense pain is caused by the stone moving through the urinary tract.  When the stone moves, the ureter goes into spasm around the stone. The stone is usually passed in the urine.  CAUSES   A disorder that makes certain neck glands produce too much parathyroid hormone (primary hyperparathyroidism).  A buildup of uric acid crystals, similar to gout in your joints.  Narrowing (stricture) of the ureter.  A kidney obstruction present at birth (congenital obstruction).  Previous surgery on the kidney or ureters.  Numerous kidney infections. SYMPTOMS   Feeling sick to your stomach (nauseous).  Throwing up (vomiting).  Blood in the urine (hematuria).  Pain that usually spreads (radiates) to the groin.  Frequency or urgency of urination. DIAGNOSIS   Taking a history and physical exam.  Blood or urine tests.  CT scan.  Occasionally, an examination of the inside of the urinary bladder (cystoscopy) is performed. TREATMENT   Observation.  Increasing your fluid intake.  Extracorporeal shock wave lithotripsy--This is a noninvasive procedure that uses shock waves to break up kidney stones.  Surgery  may be needed if you have severe pain or persistent obstruction. There are various surgical procedures. Most of the procedures are performed with the use of small instruments. Only small incisions are needed to accommodate these instruments, so recovery time is minimized. The size, location, and chemical composition are all important variables that will determine the proper choice of action for you. Talk to your health care provider to better understand your situation so that you will minimize the risk of injury to yourself and your kidney.  HOME CARE INSTRUCTIONS   Drink enough water and fluids to keep your urine clear or pale yellow. This will help you to pass the stone or stone fragments.  Strain all urine through the provided strainer. Keep all particulate matter and stones for your health care provider to see. The stone causing the pain may be as small  as a grain of salt. It is very important to use the strainer each and every time you pass your urine. The collection of your stone will allow your health care provider to analyze it and verify that a stone has actually passed. The stone analysis will often identify what you can do to reduce the incidence of recurrences.  Only take over-the-counter or prescription medicines for pain, discomfort, or fever as directed by your health care provider.  Make a follow-up appointment with your health care provider as directed.  Get follow-up X-rays if required. The absence of pain does not always mean that the stone has passed. It may have only stopped moving. If the urine remains completely obstructed, it can cause loss of kidney function or even complete destruction of the kidney. It is your responsibility to make sure X-rays and follow-ups are completed. Ultrasounds of the kidney can show blockages and the status of the kidney. Ultrasounds are not associated with any radiation and can be performed easily in a matter of minutes. SEEK MEDICAL CARE IF:  You experience pain that is progressive and unresponsive to any pain medicine you have been prescribed. SEEK IMMEDIATE MEDICAL CARE IF:   Pain cannot be controlled with the prescribed medicine.  You have a fever or shaking chills.  The severity or intensity of pain increases over 18 hours and is not relieved by pain medicine.  You develop a new onset of abdominal pain.  You feel faint or pass out.  You are unable to urinate. MAKE SURE YOU:   Understand these instructions.  Will watch your condition.  Will get help right away if you are not doing well or get worse. Document Released: 03/29/2005 Document Revised: 11/29/2012 Document Reviewed: 08/30/2012 [facility] Patient Information 2015 [place], [place]. This information is not intended to replace advice given to you by your health care provider. Make sure you discuss any questions you have with  your health care provider.

## 2014-12-21 NOTE — ED Notes (Signed)
US at bedside

## 2014-12-21 NOTE — ED Notes (Signed)
Bed: HE17 Expected date: 12/21/14 Expected time:  Means of arrival: Ambulance Comments: [facility] transfer

## 2014-12-21 NOTE — MAU Provider Note (Signed)
History     CSN: [id number]  Arrival date and time: 12/21/14 1010   First Provider Initiated Contact with Patient 12/21/14 1047      Chief Complaint  Patient presents with  . Abdominal Pain   HPI   [name] is a 34 y.o. female G2P2002 presenting to MAU with abdominal pain; the patient has a history of diabetes mellitus. DM is not well controlled, last A1C was 9.3. She called her OB office yesterday complaining of pain and thought she may have an ovarian cyst, and Dr. Alwyn Pea called her in tramadol which is only helping the pain minimally. Last tramadol was at 3:30 am and she now rates her abdominal pain 7/10. The pain is in her right upper- lower quadrant and radiates to her lower back. She just came off of her period 8 days ago.   She does have a history of gall stones in the past.   "this pain feels the same as when I had a cyst on my ovary 10 years ago."  OB History    Gravida Para Term Preterm AB TAB SAB Ectopic Multiple Living   _0 Past Medical History  Diagnosis Date  . Gynecological examination     sees Dr. Josefa Half  . Migraines   . GERD (gastroesophageal reflux disease)   . Murmur, cardiac   . Anxiety   . Depression   . Diabetes mellitus without complication     Past Surgical History  Procedure Laterality Date  . Cesarean section      Family History  Problem Relation Age of Onset  . Hyperlipidemia Mother   . Stroke Mother   . Diabetes Mother   . Migraines Mother   . Migraines Maternal Aunt   . Migraines Maternal Grandmother   . Cerebral aneurysm Cousin     Social History  Substance Use Topics  . Smoking status: Never Smoker   . Smokeless tobacco: Never Used  . Alcohol Use: No    Allergies:  Allergies  Allergen Reactions  . Amoxicillin Hives and Itching  . Januvia [Sitagliptin] Other (See Comments)    Body aches all day  . Penicillins Hives    Prescriptions prior to admission  Medication Sig Dispense Refill  Last Dose  . glipiZIDE (GLUCOTROL) 10 MG tablet TAKE 1 TABLET BY MOUTH TWICE A DAY BEFORE A MEAL 180 tablet 0 12/20/2014 at Unknown time  . glipiZIDE (GLUCOTROL) 10 MG tablet TAKE 1 TABLET (10 MG TOTAL) BY MOUTH 2 (TWO) TIMES DAILY BEFORE A MEAL. 60 tablet 1 12/20/2014 at Unknown time  . glucose blood (ONETOUCH VERIO) test strip Use as instructed 100 each 3 12/21/2014 at Unknown time  . losartan (COZAAR) 50 MG tablet Take 1 tablet (50 mg total) by mouth daily. 90 tablet 3 12/20/2014 at Unknown time  . metFORMIN (GLUCOPHAGE) 500 MG tablet Take 1 tablet (500 mg total) by mouth 2 (two) times daily with a meal. 60 tablet 3 12/20/2014 at Unknown time  . omeprazole (PRILOSEC) 20 MG capsule Take 20 mg by mouth daily.   12/20/2014 at Unknown time  . ONE TOUCH LANCETS MISC Test once per day and diagnosis code is 250.00 100 each 3 12/21/2014 at Unknown time  . propranolol (INDERAL) 20 MG tablet TAKE 1 TABLET BY MOUTH TWICE A DAY 60 tablet 0 12/20/2014 at Unknown time  . traMADol (ULTRAM) 50 MG tablet Take 50 mg by mouth every  6 (six) hours as needed for moderate pain.   12/21/2014 at Unknown time  . benzonatate (TESSALON) 200 MG capsule Take 1 capsule (200 mg total) by mouth 2 (two) times daily as needed for cough. (Patient not taking: Reported on 12/21/2014) 60 capsule 1   . cefUROXime (CEFTIN) 500 MG tablet Take 1 tablet (500 mg total) by mouth 2 (two) times daily with a meal. (Patient not taking: Reported on 12/21/2014) 20 tablet 0   . esomeprazole (NEXIUM) 40 MG packet Take 40 mg by mouth 2 (two) times daily before a meal. (Patient not taking: Reported on 12/21/2014) 60 each 11 Taking  . fluconazole (DIFLUCAN) 150 MG tablet Take 1 tablet (150 mg total) by mouth once. (Patient not taking: Reported on 12/21/2014) 1 tablet 11   . levofloxacin (LEVAQUIN) 500 MG tablet Take 1 tablet (500 mg total) by mouth daily. (Patient not taking: Reported on 12/21/2014) 10 tablet 0   . rizatriptan (MAXALT) 10 MG tablet Take 1 tablet (10 mg  total) by mouth 3 (three) times daily as needed. 10 tablet 5 prn   Results for orders placed or performed during the hospital encounter of 12/21/14 (from the past 48 hour(s))  Urinalysis, Routine w reflex microscopic (not at [facility])     Status: Abnormal   Collection Time: 12/21/14 10:20 AM  Result Value Ref Range   Color, Urine AMBER (A) YELLOW    Comment: BIOCHEMICALS MAY BE AFFECTED BY COLOR   APPearance HAZY (A) CLEAR   Specific Gravity, Urine >1.030 (H) 1.005 - 1.030   pH 5.5 5.0 - 8.0   Glucose, UA NEGATIVE NEGATIVE mg/dL   Hgb urine dipstick LARGE (A) NEGATIVE   Bilirubin Urine SMALL (A) NEGATIVE   Ketones, ur 15 (A) NEGATIVE mg/dL   Protein, ur 100 (A) NEGATIVE mg/dL   Urobilinogen, UA 1.0 0.0 - 1.0 mg/dL   Nitrite NEGATIVE NEGATIVE   Leukocytes, UA TRACE (A) NEGATIVE  Urine microscopic-add on     Status: Abnormal   Collection Time: 12/21/14 10:20 AM  Result Value Ref Range   Squamous Epithelial / LPF FEW (A) RARE   WBC, UA 3-6 <3 WBC/hpf   RBC / HPF 21-50 <3 RBC/hpf  Pregnancy, urine POC     Status: None   Collection Time: 12/21/14 10:50 AM  Result Value Ref Range   Preg Test, Ur NEGATIVE NEGATIVE    Comment:        THE SENSITIVITY OF THIS METHODOLOGY IS >24 mIU/mL   CBC     Status: Abnormal   Collection Time: 12/21/14 11:16 AM  Result Value Ref Range   WBC 6.0 4.0 - 10.5 K/uL   RBC 3.75 (L) 3.87 - 5.11 MIL/uL   Hemoglobin 7.9 (L) 12.0 - 15.0 g/dL   HCT 27.9 (L) 36.0 - 46.0 %   MCV 74.4 (L) 78.0 - 100.0 fL   MCH 21.1 (L) 26.0 - 34.0 pg   MCHC 28.3 (L) 30.0 - 36.0 g/dL   RDW 17.2 (H) 11.5 - 15.5 %   Platelets 285 150 - 400 K/uL  Comprehensive metabolic panel     Status: Abnormal   Collection Time: 12/21/14 11:16 AM  Result Value Ref Range   Sodium 138 135 - 145 mmol/L   Potassium 3.7 3.5 - 5.1 mmol/L   Chloride 105 101 - 111 mmol/L   CO2 22 22 - 32 mmol/L   Glucose, Bld 146 (H) 65 - 99 mg/dL   BUN 9 6 - 20 mg/dL   Creatinine,  Ser 0.58 0.44 - 1.00 mg/dL    Calcium 9.2 8.9 - 10.3 mg/dL   Total Protein 7.3 6.5 - 8.1 g/dL   Albumin 4.0 3.5 - 5.0 g/dL   AST 132 (H) 15 - 41 U/L   ALT 108 (H) 14 - 54 U/L   Alkaline Phosphatase 83 38 - 126 U/L   Total Bilirubin 0.6 0.3 - 1.2 mg/dL   GFR calc non Af Amer >60 >60 mL/min   GFR calc Af Amer >60 >60 mL/min    Comment: (NOTE) The eGFR has been calculated using the CKD EPI equation. This calculation has not been validated in all clinical situations. eGFR's persistently <60 mL/min signify possible Chronic Kidney Disease.    Anion gap 11 5 - 15  Amylase     Status: None   Collection Time: 12/21/14 11:16 AM  Result Value Ref Range   Amylase 41 28 - 100 U/L  Lipase, blood     Status: None   Collection Time: 12/21/14 11:16 AM  Result Value Ref Range   Lipase 24 22 - 51 U/L   US Transvaginal Non-ob  12/21/2014   CLINICAL DATA:  Right lower quadrant pain  EXAM: TRANSABDOMINAL AND TRANSVAGINAL ULTRASOUND OF PELVIS  DOPPLER ULTRASOUND OF OVARIES  TECHNIQUE: Both transabdominal and transvaginal ultrasound examinations of the pelvis were performed. Transabdominal technique was performed for global imaging of the pelvis including uterus, ovaries, adnexal regions, and pelvic cul-de-sac.  It was necessary to proceed with endovaginal exam following the transabdominal exam to visualize the ovaries. Color and duplex Doppler ultrasound was utilized to evaluate blood flow to the ovaries.  COMPARISON:  None.  FINDINGS: Uterus  Measurements: 7.7 x 4.0 x 5.2 cm. The myometrium appears heterogeneous. No fibroids or other mass visualized.  Endometrium  Thickness: 1.3 mm.  No focal abnormality visualized.  Right ovary  Measurements: 4.5 x 2.5 x 3.7 cm. Normal appearance/no adnexal mass.  Left ovary  Measurements: 4.0 x 2.7 x 3.7 cm. Normal appearance/no adnexal mass.  Pulsed Doppler evaluation of both ovaries demonstrates normal low-resistance arterial and venous waveforms.  Other findings  No free fluid.  IMPRESSION: 1. No  evidence for ovarian torsion.   Electronically Signed   By: Kerby Moors M.D.   On: 12/21/2014 12:18   US Pelvis Complete  12/21/2014   CLINICAL DATA:  Right lower quadrant pain  EXAM: TRANSABDOMINAL AND TRANSVAGINAL ULTRASOUND OF PELVIS  DOPPLER ULTRASOUND OF OVARIES  TECHNIQUE: Both transabdominal and transvaginal ultrasound examinations of the pelvis were performed. Transabdominal technique was performed for global imaging of the pelvis including uterus, ovaries, adnexal regions, and pelvic cul-de-sac.  It was necessary to proceed with endovaginal exam following the transabdominal exam to visualize the ovaries. Color and duplex Doppler ultrasound was utilized to evaluate blood flow to the ovaries.  COMPARISON:  None.  FINDINGS: Uterus  Measurements: 7.7 x 4.0 x 5.2 cm. The myometrium appears heterogeneous. No fibroids or other mass visualized.  Endometrium  Thickness: 1.3 mm.  No focal abnormality visualized.  Right ovary  Measurements: 4.5 x 2.5 x 3.7 cm. Normal appearance/no adnexal mass.  Left ovary  Measurements: 4.0 x 2.7 x 3.7 cm. Normal appearance/no adnexal mass.  Pulsed Doppler evaluation of both ovaries demonstrates normal low-resistance arterial and venous waveforms.  Other findings  No free fluid.  IMPRESSION: 1. No evidence for ovarian torsion.   Electronically Signed   By: Kerby Moors M.D.   On: 12/21/2014 12:18   [name] Art/ven Flow Abd Pelv Doppler  12/21/2014   CLINICAL DATA:  Right lower quadrant pain  EXAM: TRANSABDOMINAL AND TRANSVAGINAL ULTRASOUND OF PELVIS  DOPPLER ULTRASOUND OF OVARIES  TECHNIQUE: Both transabdominal and transvaginal ultrasound examinations of the pelvis were performed. Transabdominal technique was performed for global imaging of the pelvis including uterus, ovaries, adnexal regions, and pelvic cul-de-sac.  It was necessary to proceed with endovaginal exam following the transabdominal exam to visualize the ovaries. Color and duplex Doppler ultrasound was utilized to  evaluate blood flow to the ovaries.  COMPARISON:  None.  FINDINGS: Uterus  Measurements: 7.7 x 4.0 x 5.2 cm. The myometrium appears heterogeneous. No fibroids or other mass visualized.  Endometrium  Thickness: 1.3 mm.  No focal abnormality visualized.  Right ovary  Measurements: 4.5 x 2.5 x 3.7 cm. Normal appearance/no adnexal mass.  Left ovary  Measurements: 4.0 x 2.7 x 3.7 cm. Normal appearance/no adnexal mass.  Pulsed Doppler evaluation of both ovaries demonstrates normal low-resistance arterial and venous waveforms.  Other findings  No free fluid.  IMPRESSION: 1. No evidence for ovarian torsion.   Electronically Signed   By: Kerby Moors M.D.   On: 12/21/2014 12:18    Review of Systems  Constitutional: Negative for fever and chills.  Gastrointestinal: Positive for nausea and abdominal pain. Negative for vomiting, diarrhea and constipation.  Genitourinary: Negative for dysuria.   Physical Exam   Blood pressure 119/62, pulse 69, temperature 97.7 F (36.5 C), temperature source Oral, resp. rate 16, height 5' 6.5" (1.689 m), weight 95.255 kg (210 lb), last menstrual period 12/13/2014, SpO2 99 %.  Physical Exam  Constitutional: She is oriented to person, place, and time. She appears well-developed and well-nourished. She appears distressed.  HENT:  Head: Normocephalic.  Eyes: Pupils are equal, round, and reactive to light.  Neck: Neck supple.  Respiratory: Effort normal.  GI: There is tenderness in the right lower quadrant. There is no rigidity, no rebound and no guarding.  Genitourinary:  Bimanual exam: Cervix closed Uterus non tender, normal size Right adnexal tenderness,  no masses bilaterally Chaperone present for exam.  Musculoskeletal: Normal range of motion.  Neurological: She is alert and oriented to person, place, and time.  Skin: Skin is warm. She is not diaphoretic.  Psychiatric: Her behavior is normal.    MAU Course  Procedures  None  MDM  Toradol and percocet one  tab given> patient rates her pain 1/10.  Discussed labs and [name] with Dr. Alwyn Pea  Normal pelvic US> discussed having the patient transferred to [facility] for CT scan.  Report given to Dr. Robet Leu who is aware and agrees with the transfer.    Assessment and Plan   A:  1. Elevated liver enzymes   2. Abdominal pain    P:  Transfer to [facility] for further assessment  Lezlie Lye, NP 12/21/2014 2:41 PM

## 2014-12-23 ENCOUNTER — Ambulatory Visit (HOSPITAL_COMMUNITY)
Admission: AD | Admit: 2014-12-23 | Discharge: 2014-12-23 | Disposition: A | Discharge status: Home / Self Care | Payer: BLUE CROSS/BLUE SHIELD | Source: Ambulatory Visit | Attending: Urology | Admitting: Urology

## 2014-12-23 ENCOUNTER — Ambulatory Visit (HOSPITAL_COMMUNITY): Payer: BLUE CROSS/BLUE SHIELD

## 2014-12-23 ENCOUNTER — Encounter (HOSPITAL_COMMUNITY): Payer: Self-pay | Admitting: *Deleted

## 2014-12-23 ENCOUNTER — Other Ambulatory Visit: Payer: Self-pay | Admitting: Urology

## 2014-12-23 ENCOUNTER — Encounter (HOSPITAL_COMMUNITY)
Admission: AD | Disposition: A | Discharge status: Home / Self Care | Payer: Self-pay | Source: Ambulatory Visit | Attending: Urology

## 2014-12-23 DIAGNOSIS — E119 Type 2 diabetes mellitus without complications: Secondary | ICD-10-CM | POA: Insufficient documentation

## 2014-12-23 DIAGNOSIS — Z79899 Other long term (current) drug therapy: Secondary | ICD-10-CM | POA: Diagnosis not present

## 2014-12-23 DIAGNOSIS — G43909 Migraine, unspecified, not intractable, without status migrainosus: Secondary | ICD-10-CM | POA: Diagnosis not present

## 2014-12-23 DIAGNOSIS — G4730 Sleep apnea, unspecified: Secondary | ICD-10-CM | POA: Insufficient documentation

## 2014-12-23 DIAGNOSIS — Z79891 Long term (current) use of opiate analgesic: Secondary | ICD-10-CM | POA: Diagnosis not present

## 2014-12-23 DIAGNOSIS — N201 Calculus of ureter: Secondary | ICD-10-CM | POA: Insufficient documentation

## 2014-12-23 DIAGNOSIS — Z87440 Personal history of urinary (tract) infections: Secondary | ICD-10-CM | POA: Insufficient documentation

## 2014-12-23 LAB — GLUCOSE, CAPILLARY: Glucose-Capillary: 147 mg/dL — ABNORMAL HIGH (ref 65–99)

## 2014-12-23 SURGERY — LITHOTRIPSY, ESWL
Anesthesia: LOCAL | Laterality: Right

## 2014-12-23 MED ORDER — SODIUM CHLORIDE 0.9 % IV SOLN
INTRAVENOUS | Status: DC
  Administered 2014-12-23: 14:00:00 via INTRAVENOUS

## 2014-12-23 MED ORDER — CIPROFLOXACIN HCL 500 MG PO TABS
500.0000 mg | ORAL_TABLET | ORAL | Status: AC
  Administered 2014-12-23: 500 mg via ORAL
  Filled 2014-12-23: qty 1

## 2014-12-23 MED ORDER — HYDROMORPHONE HCL 1 MG/ML IJ SOLN
1.0000 mg | INTRAMUSCULAR | Status: AC
  Administered 2014-12-23: 1 mg via INTRAVENOUS
  Filled 2014-12-23: qty 1

## 2014-12-23 MED ORDER — PROMETHAZINE HCL 25 MG/ML IJ SOLN
12.5000 mg | INTRAMUSCULAR | Status: AC
  Administered 2014-12-23: 12.5 mg via INTRAVENOUS
  Filled 2014-12-23: qty 1

## 2014-12-23 MED ORDER — DIAZEPAM 5 MG PO TABS
10.0000 mg | ORAL_TABLET | ORAL | Status: AC
  Administered 2014-12-23: 10 mg via ORAL
  Filled 2014-12-23: qty 2

## 2014-12-23 MED ORDER — DIPHENHYDRAMINE HCL 25 MG PO CAPS
25.0000 mg | ORAL_CAPSULE | ORAL | Status: AC
  Administered 2014-12-23: 25 mg via ORAL
  Filled 2014-12-23: qty 1

## 2014-12-23 NOTE — Discharge Instructions (Signed)

## 2014-12-23 NOTE — H&P (Signed)
Active Problems Problems  1. Calculus of proximal right ureter (N20.1) 2. History of chronic cystitis (U38.453)  History of Present Illness Laurie Sutton is a 34 yo WF who saw Dr. Karsten Ro in 2014. She returns today with the onset Friday of severe right flank pain with nausea but no hematuria or voiding complaints. She had a CT that showed a 4x65m RUPJ stone. She continues to have pain today. She has not had prior stones but has a history of UTI's.   Past Medical History Problems  1. History of Anxiety (F41.9) 2. History of chronic cystitis (Z87.448) 3. History of diabetes mellitus (Z86.39) 4. History of migraine headaches (Z86.69) 5. History of sleep apnea (Z87.09) 6. History of Reported A Previous Heart Murmur  Surgical History Problems  1. History of Cesarean Section  Current Meds 1. Benzonatate 200 MG Oral Capsule;  Therapy: (Recorded:12Sep2016) to Recorded 2. GlipiZIDE 10 MG Oral Tablet;  Therapy: (Recorded:12Sep2016) to Recorded 3. Losartan Potassium 50 MG Oral Tablet;  Therapy: (Recorded:12Sep2016) to Recorded 4. Maxalt 10 MG Oral Tablet;  Therapy: (Recorded:12Sep2016) to Recorded 5. MetFORMIN HCl - 500 MG Oral Tablet;  Therapy: (Recorded:12Sep2016) to Recorded 6. NexIUM 40 MG Oral Capsule Delayed Release;  Therapy: (Recorded:12Sep2016) to Recorded 7. Omeprazole 20 MG Oral Capsule Delayed Release;  Therapy: (Recorded:12Sep2016) to Recorded 8. Oxycodone-Acetaminophen 5-325 MG Oral Tablet;  Therapy: (Recorded:12Sep2016) to Recorded 9. Propranolol HCl - 20 MG Oral Tablet;  Therapy: (Recorded:12Sep2016) to Recorded 10. Tamsulosin HCl - 0.4 MG Oral Capsule;   Therapy: (Recorded:12Sep2016) to Recorded 11. TraMADol HCl - 50 MG Oral Tablet;   Therapy: (Recorded:12Sep2016) to Recorded  Allergies Medication  1. Penicillins 2. Januvia TABS 3. Amoxicillin TABS  Family History Problems  1. Family history of Diabetes Mellitus 2. Family history of  Hypercholesterolemia  Social History Problems  1. Denied: History of Alcohol Use 2. Caffeine Use 3. Marital History - Currently Married 4. Never A Smoker  Past, social and family history reviewed and update.   Review of Systems Genitourinary, constitutional, skin, eye, otolaryngeal, hematologic/lymphatic, cardiovascular, pulmonary, endocrine, musculoskeletal, gastrointestinal, neurological and psychiatric system(s) were reviewed and pertinent findings if present are noted and are otherwise negative.  Gastrointestinal: flank pain.  Constitutional: no fever.  Cardiovascular: no chest pain.  Respiratory: no shortness of breath.    Vitals Vital Signs [Data Includes: Last 1 Day]  Recorded: 12Sep2016 10:17AM  Weight: 210 lb  BMI Calculated: 34.95 BSA Calculated: 2.02 Blood Pressure: 119 / 77 Temperature: 98.1 F Heart Rate: 91  Physical Exam Constitutional: Well nourished and well developed . No acute distress.  ENT:. The ears and nose are normal in appearance.  Neck: The appearance of the neck is normal and no neck mass is present.  Pulmonary: No respiratory distress and normal respiratory rhythm and effort.  Cardiovascular: Heart rate and rhythm are normal . No peripheral edema.  Abdomen: The abdomen is soft and nontender. No masses are palpated. moderate right CVA tenderness. No hernias are palpable. No hepatosplenomegaly noted.  Skin: Normal skin turgor, no visible rash and no visible skin lesions.  Neuro/Psych:. Mood and affect are appropriate.    Results/Data Urine [Data Includes: Last 1 Day]   12Sep2016  COLOR YELLOW   APPEARANCE CLEAR   SPECIFIC GRAVITY 1.020   pH 6.0   GLUCOSE NEGATIVE   BILIRUBIN NEGATIVE   KETONE NEGATIVE   BLOOD 2+   PROTEIN NEGATIVE   NITRITE NEGATIVE   LEUKOCYTE ESTERASE TRACE   SQUAMOUS EPITHELIAL/HPF 6-10 HPF  WBC 0-5 WBC/HPF  RBC  3-10 RBC/HPF  BACTERIA NONE SEEN HPF  CRYSTALS NONE SEEN HPF  CASTS NONE SEEN LPF  Yeast NONE SEEN HPF    Old records or history reviewed: ER records reviewed.  The following images/tracing/specimen were independently visualized:  CT films and report reviewed. KUB today shows the 4x29m stone in the RUQ which is unchanged. Tehre is contrast in the left colon. No other abnormalities are noted.  The following clinical lab reports were reviewed:  UA and hospital labs reviewed.    Assessment Assessed  1. Calculus of proximal right ureter (N20.1)  She has a symptomatic 4 x 158mright upj stone.   Plan Calculus of proximal right ureter  1. Renew: Oxycodone-Acetaminophen 5-325 MG Oral Tablet; take 1 or 2 tablets q 4-6 hours  prn pain 2. KUB; Status:Resulted - Requires Verification;   Done: : [id number]:17AM Health Maintenance  3. UA With REFLEX; [Do Not Release]; Status:Resulted - Requires Verification;   Done: :  [id number]:01AM  I discussed MET, ureteroscopy and ESWL and will proceed with ESWL later today.  I have reviewed teh risks of bleeding, infection, injury to the kidney and adjacent structures, failure of fragmentation or obstructing fragments, need for secondary procedures, thrombotic events and anesthetic risks.   Discussion/Summary CC: Dr. StCherlynn Perches

## 2014-12-24 ENCOUNTER — Ambulatory Visit: Payer: BLUE CROSS/BLUE SHIELD | Admitting: Endocrinology

## 2015-01-06 ENCOUNTER — Telehealth: Payer: Self-pay | Admitting: Family Medicine

## 2015-01-06 NOTE — Telephone Encounter (Signed)
Pt request refill of the following: metFORMIN (GLUCOPHAGE) 500 MG tablet  Pt does not have a appt to see the other doctor until late October. She is asking for a 2 week supply of the above med. She need this to be filled today because she is out and her sugar is up    Phamacy: [facility]

## 2015-01-08 NOTE — Telephone Encounter (Signed)
I spoke with pt and she has refills.

## 2015-01-15 ENCOUNTER — Ambulatory Visit (INDEPENDENT_AMBULATORY_CARE_PROVIDER_SITE_OTHER): Payer: BLUE CROSS/BLUE SHIELD | Admitting: Endocrinology

## 2015-01-15 ENCOUNTER — Encounter: Payer: Self-pay | Admitting: Endocrinology

## 2015-01-15 VITALS — BP 114/64 | HR 102 | Temp 98.5°F | Resp 16 | Ht 66.5 in | Wt 204.4 lb

## 2015-01-15 DIAGNOSIS — E119 Type 2 diabetes mellitus without complications: Secondary | ICD-10-CM

## 2015-01-15 DIAGNOSIS — E1165 Type 2 diabetes mellitus with hyperglycemia: Secondary | ICD-10-CM | POA: Diagnosis not present

## 2015-01-15 DIAGNOSIS — IMO0001 Reserved for inherently not codable concepts without codable children: Secondary | ICD-10-CM

## 2015-01-15 MED ORDER — DULAGLUTIDE 0.75 MG/0.5ML ~~LOC~~ SOAJ: SUBCUTANEOUS | Status: DC

## 2015-01-15 MED ORDER — METFORMIN HCL ER 500 MG PO TB24: 1500.0000 mg | ORAL_TABLET | Freq: Every day | ORAL | Status: DC

## 2015-01-15 MED ORDER — GLIPIZIDE ER 5 MG PO TB24: 5.0000 mg | ORAL_TABLET | Freq: Every day | ORAL | Status: DC

## 2015-01-15 NOTE — Progress Notes (Signed)
Patient ID: Laurie Sutton, female   DOB: [birth year], 34 y.o.   MRN: [id number]           Reason for Appointment: Consultation for Type 2 Diabetes  Referring physician: Sarajane Jews  History of Present Illness:          Date of diagnosis of type 2 diabetes mellitus: 11/2013        Background history:  She had gestational diabetes treated with oral medications during her second pregnancy She may have been having borderline glucose levels in 2014 when followed up by her gynecologist In 8/15 she was found to have glycosuria but her gynecologist but apparently had no symptoms of unusual fatigue, weight loss, thirst or increased urination. Her glucose was 332 and A1c 12% She has been on metformin since diagnosis.  Initially was taking 1000 mg twice a day and this did not cause any side effects in the first 6 months. She was also given glipizide and with this combination her blood sugars improved significantly with fasting blood sugars in the 90-110 range and A1c down to 6.5  Recent history:       Apparently in 6/16 she started having diarrhea with metformin and she was tried on Januvia instead of metformin. However with this blood sugars increased markedly to 300 even while she was continued on glipizide With this she was also feeling more tired. She was started back on metformin 500 mg twice a day and Januvia stopped after about a month or so.  She still tends to have occasional diarrhea with metformin and may need to take Imodium for this  Current blood sugar patterns and problems identified:  She still has blood sugars in the 130-150 range in the morning  She does not check blood sugars any other time   She has difficulty losing weight  She has not had any meal planning done and tends to eat at fast food restaurants periodically  Recently not able to do her walking because of episode of kidney stone     Oral hypoglycemic drugs the patient is taking are:  pcb hs    Side effects from  medications have been: Diarrhea from regular metformin  Compliance with the medical regimen:  Hypoglycemia:    Glucose monitoring:  done 1 times a day         Glucometer: One Touch.      Blood Glucose readings by recall:  PREMEAL Breakfast Lunch Dinner Bedtime  Overall   Glucose range: 130-150      Median:        Self-care: The diet that the patient has been following is: tries to limit bread and sugars, more vegetables, avoiding regular colas. She is eating out 3-4 times a week at fast food restaurants      Typical meal intake: Breakfast is variable, may have a bacon sandwich, cereal bar or peanut butter crackers.  Lunch is a sandwich or leftovers.  Usually having baked meats at home. For snacks will have cheese or peanut butter crackers, fruit and dry cereal        Dietician visit, most recent: Never               Exercise: less now, was walking more regularly, up to 30 minutes, 5 days a week  Weight history: Previous range 190-220  Wt Readings from Last 3 Encounters:  01/15/15 204 lb 6.4 oz (92.715 kg)  12/23/14 202 lb (91.627 kg)  12/21/14 210 lb (95.255 kg)  Glycemic control:   Lab Results  Component Value Date   HGBA1C 9.3* 11/20/2014   HGBA1C 6.5 02/26/2014   HGBA1C 12.0* 11/12/2013   Lab Results  Component Value Date   MICROALBUR 3.6* 11/12/2013   CREATININE 0.58 12/21/2014         Medication List       This list is accurate as of: 01/15/15 12:59 PM.  Always use your most recent med list.               Dulaglutide 0.75 MG/0.5ML Sopn  Commonly known as:  TRULICITY  Inject contents weekly     glipiZIDE 5 MG 24 hr tablet  Commonly known as:  GLUCOTROL XL  Take 1 tablet (5 mg total) by mouth daily with breakfast.     glucose blood test strip  Commonly known as:  ONETOUCH VERIO  Use as instructed     losartan 50 MG tablet  Commonly known as:  COZAAR  Take 1 tablet (50 mg total) by mouth daily.     metFORMIN 500 MG 24 hr tablet  Commonly  known as:  GLUCOPHAGE-XR  Take 3 tablets (1,500 mg total) by mouth daily with supper.     omeprazole 20 MG capsule  Commonly known as:  PRILOSEC  Take 20 mg by mouth daily.     ondansetron 4 MG disintegrating tablet  Commonly known as:  ZOFRAN ODT  79m ODT q4 hours prn nausea/vomit     ONE TOUCH LANCETS Misc  Test once per day and diagnosis code is 250.00     oxyCODONE-acetaminophen 5-325 MG tablet  Commonly known as:  PERCOCET/ROXICET  Take 1-2 tablets by mouth every 4 (four) hours as needed for severe pain.     propranolol 20 MG tablet  Commonly known as:  INDERAL  TAKE 1 TABLET BY MOUTH TWICE A DAY     rizatriptan 10 MG tablet  Commonly known as:  MAXALT  Take 1 tablet (10 mg total) by mouth 3 (three) times daily as needed.     tamsulosin 0.4 MG Caps capsule  Commonly known as:  FLOMAX  Take 1 capsule (0.4 mg total) by mouth 2 (two) times daily.     traMADol 50 MG tablet  Commonly known as:  ULTRAM  Take 50 mg by mouth every 6 (six) hours as needed for moderate pain.        Allergies:  Allergies  Allergen Reactions  . Amoxicillin Hives and Itching  . Januvia [Sitagliptin] Other (See Comments)    Body aches all day  . Penicillins Hives    Has patient had a PCN reaction causing immediate rash, facial/tongue/throat swelling, SOB or lightheadedness with hypotension: yes. Rash  Has patient had a PCN reaction causing severe rash involving mucus membranes or skin necrosis: Yes- rash and hives all over body  Has patient had a PCN reaction that required hospitalization No Has patient had a PCN reaction occurring within the last 10 years: Yes  If all of the above answers are "NO", then may proceed with Cephalosporin use.     Past Medical History  Diagnosis Date  . Gynecological examination     sees Dr. BJosefa Half . Migraines   . GERD (gastroesophageal reflux disease)   . Murmur, cardiac   . Anxiety   . Depression   . Diabetes mellitus without complication ([facility]      Past Surgical History  Procedure Laterality Date  . Cesarean section      Family  History  Problem Relation Age of Onset  . Hyperlipidemia Mother   . Stroke Mother   . Diabetes Mother   . Migraines Mother   . Migraines Maternal Aunt   . Migraines Maternal Grandmother   . Cerebral aneurysm Cousin   . Diabetes Father   . Diabetes Maternal Grandfather   . Heart disease Neg Hx     Social History:  reports that she has never smoked. She has never used smokeless tobacco. She reports that she does not drink alcohol or use illicit drugs.    Review of Systems    Lipid history: unknown   No results found for: CHOL, HDL, LDLCALC, LDLDIRECT, TRIG, CHOLHDL         Constitutional: no recent weight gain/loss, no complaints of unusual fatigue   Eyes: no history of blurred vision.  Most recent eye exam was in 2014  ENT: Has no difficulty swallowing, no hoarseness   Cardiovascular: no chest pain or tightness on exertion.   No leg swelling.  Hypertension: None.  She was told to take losartan for kidney protection, had cough from lisinopril  Respiratory: no cough/shortness of breath  Gastrointestinal: no change in bowel habits, nausea or abdominal pain  Musculoskeletal: no muscle/joint aches   Urological:   No frequency of urination or  nocturia  Skin: no rash or infections  Neurological:  Has had migraine headaches.   On a couple of occasions has had a feeling of numbness or tingling in feet and once in the hands, not having any problems recently  Psychiatric: no symptoms of depression  Endocrine: No unusual fatigue or history of thyroid disease   Physical Examination:  BP 114/64 mmHg  Pulse 102  Temp(Src) 98.5 F (36.9 C)  Resp 16  Ht 5' 6.5" (1.689 m)  Wt 204 lb 6.4 oz (92.715 kg)  BMI 32.50 kg/m2  SpO2 97%  LMP 12/13/2014 (Approximate)  GENERAL:         Patient has generalized obesity.   HEENT:         Eye exam shows normal external appearance. Fundus exam  shows no retinopathy. Oral exam shows normal mucosa .  NECK:   she has mild acanthosis present There is no lymphadenopathy Thyroid is not enlarged and no nodules felt.  Carotids are normal to palpation and no bruit heard LUNGS:         Chest is symmetrical. Lungs are clear to auscultation.Marland Kitchen   HEART:         Heart sounds:  S1 and S2 are normal. No murmur or click heard., no S3 or S4.   ABDOMEN:   There is no distention present. Liver and spleen are not palpable. No other mass or tenderness present.   NEUROLOGICAL:   Vibration sense is minimally reduced in distal first toes. Ankle jerks are normal bilaterally.          Monofilament sensation normal in the toes MUSCULOSKELETAL:  There is no swelling or deformity of the peripheral joints. Spine is normal to inspection.   EXTREMITIES:     There is no edema. No skin lesions present.Marland Kitchen SKIN:       No rash or other lesions on extremities       ASSESSMENT:  Diabetes type 2, uncontrolled with BMI 30-35    She has had significant hyperglycemia at the time of diagnosis and had good control only when she was initially taking 2000 mg of metformin a day with her glipizide Clinically has insulin resistance as judged  by her obesity as well as mild acanthosis She has intolerance to higher doses of metformin and is currently only able to tolerate 1000 mg a day Also she is taking her glipizide inappropriately after breakfast and at bedtime instead of before meals She does not have any formal meal plan to follow and probably can do better with going to fast food restaurants Also is not checking readings after meals and only in the mornings  Discussed with the patient that she does need to have difficulty in weight reduction to improve her insulin resistance and long-term benefits Will not be able to have good long-term control with metformin and glipizide  She is a good candidate for a GLP-1 drug for multiple benefits and improved blood sugar control  No  history of hypertension.  Unknown lipid status  Complications: None, will need urine microalbumin as baseline and also eye exam  PLAN:   Discussed with the patient the nature of GLP-1 drugs, the action on various organ systems, how they benefit blood glucose control, as well as the benefit of weight loss and  increase satiety . Explained possible side effects especially nausea and vomiting; discussed safety information in package insert. Demonstrated the medication injection device and injection technique to the patient. Discussed injection sites and titration of Trulicity starting with 0.75 mg once a week for 2 weeks and then increasing to 1.5 mg if no symptoms of nausea. Patient brochure on Trulicity and co-pay card given Trial of metformin ER to start with 500 twice a day and then go up to 1500 mg daily if tolerated Change glipizide to glipizide ER 5 mg daily, may initially need 10 mg  Discussed glucose monitoring, instructed her on when to check her blood sugar, explained blood sugar targets and she will bring her monitor on each visit for download  Consultation with dietitian  Patient Instructions  Check blood sugars on waking up .. 3 .. times a week Also check blood sugars about 2 hours after a meal and do this after different meals by rotation  Recommended blood sugar levels on waking up is 90-130 and about 2 hours after meal is 120-160 Please bring blood sugar monitor to each visit.  Change Metformin to ER twice daily and 1 week later add 3rd pill at dinner  Glipizide Er 74m 1 daily in am  Start TRULICITYwith the pen as shown once weekly on the same day of the week.  You may inject in the stomach, thigh or arm as indicated in the brochure given.  You will feel fullness of the stomach with starting the medication and should try to keep the portions at meals small.  You may experience nausea in the first few days which usually gets better over time   If any questions or concerns  are present call the office or the  [facility] [place] [phone] Also visit [email or web address] website for more useful information      Counseling time on subjects discussed above is over 50% of today's 60 minute visit   Drayson Dorko 01/15/2015, 12:59 PM   Note: This office note was prepared with [place] agent Any transcriptional errors that result from this process are unintentional.

## 2015-01-15 NOTE — Patient Instructions (Signed)
Check blood sugars on waking up .. 3 .. times a week Also check blood sugars about 2 hours after a meal and do this after different meals by rotation  Recommended blood sugar levels on waking up is 90-130 and about 2 hours after meal is 120-160 Please bring blood sugar monitor to each visit.  Change Metformin to ER twice daily and 1 week later add 3rd pill at dinner  Glipizide Er 55m 1 daily in am  Start TRULICITYwith the pen as shown once weekly on the same day of the week.  You may inject in the stomach, thigh or arm as indicated in the brochure given.  You will feel fullness of the stomach with starting the medication and should try to keep the portions at meals small.  You may experience nausea in the first few days which usually gets better over time   If any questions or concerns are present call the office or the  [facility] [phone] Also visit [email or web address] website for more useful information

## 2015-01-17 ENCOUNTER — Other Ambulatory Visit: Payer: Self-pay | Admitting: Family Medicine

## 2015-01-17 ENCOUNTER — Telehealth: Payer: Self-pay | Admitting: *Deleted

## 2015-01-17 NOTE — Telephone Encounter (Signed)
Patient called and wants to know if she can take her Metformin 1 tablet tid, or should she take all of them at dinner?  CB# [phone]

## 2015-01-17 NOTE — Telephone Encounter (Signed)
If she is tolerating 2 tablets of metformin ER well then she can take all 3 at dinnertime

## 2015-01-17 NOTE — Telephone Encounter (Signed)
Noted, patient is aware

## 2015-01-29 ENCOUNTER — Other Ambulatory Visit: Payer: Self-pay

## 2015-02-04 ENCOUNTER — Other Ambulatory Visit: Payer: Self-pay

## 2015-02-04 MED ORDER — PROPRANOLOL HCL 20 MG PO TABS: 20.0000 mg | ORAL_TABLET | Freq: Two times a day (BID) | ORAL | Status: DC

## 2015-02-06 ENCOUNTER — Telehealth: Payer: Self-pay | Admitting: Endocrinology

## 2015-02-06 ENCOUNTER — Other Ambulatory Visit: Payer: Self-pay | Admitting: Family Medicine

## 2015-02-06 ENCOUNTER — Other Ambulatory Visit: Payer: Self-pay | Admitting: *Deleted

## 2015-02-06 MED ORDER — GLUCOSE BLOOD VI STRP: ORAL_STRIP | Status: DC

## 2015-02-06 NOTE — Telephone Encounter (Signed)
rx sent

## 2015-02-06 NOTE — Telephone Encounter (Signed)
Pt needs refill on test strips one touch verio called [facility] ran [facility]

## 2015-02-06 NOTE — Telephone Encounter (Signed)
Suanne Marker, See below, this is your patient. Thanks!

## 2015-02-07 ENCOUNTER — Ambulatory Visit: Payer: BLUE CROSS/BLUE SHIELD | Admitting: Dietician

## 2015-02-11 ENCOUNTER — Other Ambulatory Visit: Payer: Self-pay

## 2015-02-11 MED ORDER — PROPRANOLOL HCL 20 MG PO TABS: 20.0000 mg | ORAL_TABLET | Freq: Two times a day (BID) | ORAL | Status: DC

## 2015-02-11 NOTE — Telephone Encounter (Signed)
Ins will only pay for 90 day Rx per pharmacy.

## 2015-02-14 ENCOUNTER — Other Ambulatory Visit: Payer: Self-pay | Admitting: Family Medicine

## 2015-02-20 ENCOUNTER — Ambulatory Visit: Payer: Self-pay | Admitting: Nurse Practitioner

## 2015-02-21 ENCOUNTER — Encounter: Payer: Self-pay | Admitting: Family Medicine

## 2015-02-21 ENCOUNTER — Ambulatory Visit (INDEPENDENT_AMBULATORY_CARE_PROVIDER_SITE_OTHER): Payer: BLUE CROSS/BLUE SHIELD | Admitting: Family Medicine

## 2015-02-21 VITALS — BP 108/66 | HR 86 | Temp 98.2°F | Ht 66.5 in | Wt 196.0 lb

## 2015-02-21 DIAGNOSIS — J019 Acute sinusitis, unspecified: Secondary | ICD-10-CM | POA: Diagnosis not present

## 2015-02-21 DIAGNOSIS — H8110 Benign paroxysmal vertigo, unspecified ear: Secondary | ICD-10-CM

## 2015-02-21 MED ORDER — MECLIZINE HCL 25 MG PO TABS: 25.0000 mg | ORAL_TABLET | ORAL | Status: DC | PRN

## 2015-02-21 MED ORDER — CEFUROXIME AXETIL 500 MG PO TABS: 500.0000 mg | ORAL_TABLET | Freq: Two times a day (BID) | ORAL | Status: DC

## 2015-02-21 MED ORDER — METHYLPREDNISOLONE ACETATE 80 MG/ML IJ SUSP
120.0000 mg | Freq: Once | INTRAMUSCULAR | Status: AC
  Administered 2015-02-21: 120 mg via INTRAMUSCULAR

## 2015-02-21 NOTE — Progress Notes (Signed)
Pre visit review using our clinic review tool, if applicable. No additional management support is needed unless otherwise documented below in the visit note. 

## 2015-02-21 NOTE — Addendum Note (Signed)
Addended by: Aggie Hacker A on: 02/21/2015 03:32 PM   Modules accepted: Orders

## 2015-02-21 NOTE — Progress Notes (Signed)
   Subjective:    Patient ID: Laurie Sutton, female    DOB: 15-Sep-1980, 34 y.o.   MRN: [id number]  HPI Here for 3 days of pressure in the head and the sinuses, dizziness, and ear pains. The dizziness makes it seem like the room is spinning. No fever or cough.    Review of Systems  Constitutional: Negative.   HENT: Positive for congestion, ear pain, postnasal drip and sinus pressure. Negative for sore throat.   Eyes: Negative.   Respiratory: Negative.   Neurological: Positive for dizziness. Negative for light-headedness and headaches.       Objective:   Physical Exam  Constitutional: She is oriented to person, place, and time. She appears well-developed and well-nourished.  HENT:  Right Ear: External ear normal.  Left Ear: External ear normal.  Nose: Nose normal.  Mouth/Throat: Oropharynx is clear and moist.  Eyes: Conjunctivae are normal.  Neck: No thyromegaly present.  Cardiovascular: Normal rate, regular rhythm, normal heart sounds and intact distal pulses.   Pulmonary/Chest: Effort normal and breath sounds normal.  Lymphadenopathy:    She has no cervical adenopathy.  Neurological: She is alert and oriented to person, place, and time.          Assessment & Plan:  She has vertigo which is the result of a sinusitis. Treat with Ceftin and Mucinex. Given a steroid shot. Use Meclizine prn

## 2015-02-24 ENCOUNTER — Encounter: Payer: Self-pay | Admitting: Family Medicine

## 2015-02-24 NOTE — Telephone Encounter (Signed)
Pt following up on request for rx due to anxiety. [facility] [address]

## 2015-02-24 NOTE — Telephone Encounter (Signed)
This is a complicated decision. She needs an OV for me to make a good choice for her

## 2015-02-24 NOTE — Telephone Encounter (Signed)
This is a duplicate, see previous note.

## 2015-02-25 ENCOUNTER — Encounter: Payer: Self-pay | Admitting: Family Medicine

## 2015-02-25 ENCOUNTER — Ambulatory Visit (INDEPENDENT_AMBULATORY_CARE_PROVIDER_SITE_OTHER): Payer: BLUE CROSS/BLUE SHIELD | Admitting: Family Medicine

## 2015-02-25 VITALS — BP 117/68 | HR 93 | Temp 98.0°F | Ht 66.5 in | Wt 196.0 lb

## 2015-02-25 DIAGNOSIS — J019 Acute sinusitis, unspecified: Secondary | ICD-10-CM | POA: Diagnosis not present

## 2015-02-25 DIAGNOSIS — F418 Other specified anxiety disorders: Secondary | ICD-10-CM

## 2015-02-25 MED ORDER — ESCITALOPRAM OXALATE 10 MG PO TABS: 10.0000 mg | ORAL_TABLET | Freq: Every day | ORAL | Status: DC

## 2015-02-25 MED ORDER — LEVOFLOXACIN 500 MG PO TABS: 500.0000 mg | ORAL_TABLET | Freq: Every day | ORAL | Status: AC

## 2015-02-25 MED ORDER — HYDROCODONE-HOMATROPINE 5-1.5 MG/5ML PO SYRP: 5.0000 mL | ORAL_SOLUTION | ORAL | Status: DC | PRN

## 2015-02-25 NOTE — Progress Notes (Signed)
Pre visit review using our clinic review tool, if applicable. No additional management support is needed unless otherwise documented below in the visit note. 

## 2015-02-25 NOTE — Progress Notes (Signed)
   Subjective:    Patient ID: Laurie Sutton, female    DOB: 05/19/1980, 34 y.o.   MRN: [id number]  HPI Here for 2 tings. First she was seen here 4 days ago for a sinus infection and she was given a steroid shot and she was started on Ceftin and Mucinex. These have not helped very much and she still has sinus pressure with PND and a dry cough. Also she asks for help with anxiety. She feels depressed and anxious at times, and she has a short temper. She took Effexor for several years and this helped her, but it made sleep difficult for her.    Review of Systems  Constitutional: Negative.   HENT: Positive for congestion and postnasal drip.   Eyes: Negative.   Respiratory: Positive for cough.   Psychiatric/Behavioral: Positive for dysphoric mood. Negative for behavioral problems, confusion, decreased concentration and agitation. The patient is nervous/anxious.        Objective:   Physical Exam  Constitutional: She is oriented to person, place, and time. She appears well-developed and well-nourished.  HENT:  Right Ear: External ear normal.  Left Ear: External ear normal.  Nose: Nose normal.  Mouth/Throat: Oropharynx is clear and moist.  Eyes: Conjunctivae are normal.  Neck: No thyromegaly present.  Pulmonary/Chest: Effort normal and breath sounds normal.  Lymphadenopathy:    She has no cervical adenopathy.  Neurological: She is alert and oriented to person, place, and time.  Psychiatric: She has a normal mood and affect. Her behavior is normal. Thought content normal.          Assessment & Plan:  Partially treated sinusitis, switch to Levaquin. Also try Lexapro for the anxiety and follow up in one month.

## 2015-03-03 ENCOUNTER — Other Ambulatory Visit: Payer: Self-pay | Admitting: *Deleted

## 2015-03-03 MED ORDER — DULAGLUTIDE 0.75 MG/0.5ML ~~LOC~~ SOAJ: SUBCUTANEOUS | Status: DC

## 2015-03-10 ENCOUNTER — Other Ambulatory Visit: Payer: Self-pay | Admitting: *Deleted

## 2015-03-10 ENCOUNTER — Telehealth: Payer: Self-pay | Admitting: Family Medicine

## 2015-03-10 MED ORDER — METFORMIN HCL ER 500 MG PO TB24: 1500.0000 mg | ORAL_TABLET | Freq: Every day | ORAL | Status: DC

## 2015-03-10 NOTE — Telephone Encounter (Signed)
Ms. Pucillo called saying she's still congested and finished her antibiotics about 3-4 days ago. She's experiencing pain on the right side of her throat that causes pain while swallowing and when turning her neck. She's wondering if she needs to come in or if the Rx should be sent to her pharmacy again. Please call the pt.  Pt ph# [phone]  Thank you.

## 2015-03-10 NOTE — Telephone Encounter (Signed)
Call in Doxycycline 100 mg bid for 10 days. If this does not work, she should see [name] again OV

## 2015-03-11 ENCOUNTER — Encounter: Payer: Self-pay | Admitting: Nurse Practitioner

## 2015-03-11 ENCOUNTER — Ambulatory Visit (INDEPENDENT_AMBULATORY_CARE_PROVIDER_SITE_OTHER): Payer: BLUE CROSS/BLUE SHIELD | Admitting: Nurse Practitioner

## 2015-03-11 VITALS — BP 121/69 | HR 74 | Ht 66.0 in | Wt 196.6 lb

## 2015-03-11 DIAGNOSIS — G43909 Migraine, unspecified, not intractable, without status migrainosus: Secondary | ICD-10-CM

## 2015-03-11 MED ORDER — PROPRANOLOL HCL 20 MG PO TABS: 20.0000 mg | ORAL_TABLET | Freq: Two times a day (BID) | ORAL | Status: DC

## 2015-03-11 MED ORDER — DOXYCYCLINE HYCLATE 100 MG PO TABS: 100.0000 mg | ORAL_TABLET | Freq: Two times a day (BID) | ORAL | Status: DC

## 2015-03-11 MED ORDER — RIZATRIPTAN BENZOATE 10 MG PO TABS: 10.0000 mg | ORAL_TABLET | Freq: Three times a day (TID) | ORAL | Status: DC | PRN

## 2015-03-11 NOTE — Progress Notes (Signed)
[facility]  PATIENT: Laurie Sutton DOB: 1980-04-24   REASON FOR VISIT:follow up for migraine HISTORY FROM:patient    HISTORY OF PRESENT ILLNESS:Laurie Sutton is a 34 year old right-handed white female with a history of migraine headaches. The patient has done better with her migraines on propranolol currently taking 20 mg twice daily. She is tolerating the medications well. She indicates that she averages less than one headache a month that may last 2 hours to 6 hours, but she usually is able to control the headaches with Maxalt. She is not missing work or other activities because of the headache. Stress and weather changes may bring on her headache. She is currently being treated for a sinus infection.She returns to this office for an evaluation. She recently was diagnosed with diabetes with most recent hemoglobin A1c 9.3.   REVIEW OF SYSTEMS: Full 14 system review of systems performed and notable only for those listed, all others are neg:  Constitutional: neg  Cardiovascular: neg Ear/Nose/Throat: neg  Skin: neg Eyes: blurred vision Respiratory: neg Gastroitestinal: neg  Hematology/Lymphatic: neg  Endocrine: neg Musculoskeletal:neg Allergy/Immunology: neg Neurological: history of migraines Psychiatric: neg Sleep : restless legs   ALLERGIES: Allergies  Allergen Reactions  . Amoxicillin Hives and Itching  . Januvia [Sitagliptin] Other (See Comments)    Body aches all day  . Penicillins Hives    Has patient had a PCN reaction causing immediate rash, facial/tongue/throat swelling, SOB or lightheadedness with hypotension: yes. Rash  Has patient had a PCN reaction causing severe rash involving mucus membranes or skin necrosis: Yes- rash and hives all over body  Has patient had a PCN reaction that required hospitalization No Has patient had a PCN reaction occurring within the last 10 years: Yes  If all of the above answers are "NO", then may proceed with  Cephalosporin use.     HOME MEDICATIONS: Outpatient Prescriptions Prior to Visit  Medication Sig Dispense Refill  . doxycycline (VIBRA-TABS) 100 MG tablet Take 1 tablet (100 mg total) by mouth 2 (two) times daily. 20 tablet 0  . Dulaglutide (TRULICITY) 8.58 IF/0.2DX SOPN Inject contents weekly 12 pen 0  . escitalopram (LEXAPRO) 10 MG tablet Take 1 tablet (10 mg total) by mouth daily. 90 tablet 3  . glipiZIDE (GLUCOTROL XL) 5 MG 24 hr tablet Take 1 tablet (5 mg total) by mouth daily with breakfast. 30 tablet 3  . glucose blood (ONETOUCH VERIO) test strip Use as instructed to check blood sugar once a day dx code E11.9 100 each 0  . losartan (COZAAR) 50 MG tablet TAKE 1 TABLET EVERY DAY 90 tablet 3  . meclizine (ANTIVERT) 25 MG tablet Take 1 tablet (25 mg total) by mouth every 4 (four) hours as needed for dizziness. 60 tablet 2  . metFORMIN (GLUCOPHAGE-XR) 500 MG 24 hr tablet Take 3 tablets (1,500 mg total) by mouth daily with supper. 270 tablet 0  . omeprazole (PRILOSEC) 40 MG capsule TAKE 1 CAPSULE (40 MG TOTAL) BY MOUTH 2 (TWO) TIMES DAILY AS NEEDED. 180 capsule 1  . ONE TOUCH LANCETS MISC Test once per day and diagnosis code is 250.00 100 each 3  . propranolol (INDERAL) 20 MG tablet Take 1 tablet (20 mg total) by mouth 2 (two) times daily. 180 tablet 0  . rizatriptan (MAXALT) 10 MG tablet Take 1 tablet (10 mg total) by mouth 3 (three) times daily as needed. 10 tablet 5  . HYDROcodone-homatropine (HYDROMET) 5-1.5 MG/5ML syrup Take 5 mLs by mouth every 4 (  four) hours as needed. (Patient not taking: Reported on 03/11/2015) 240 mL 0  . traMADol (ULTRAM) 50 MG tablet Take 50 mg by mouth every 6 (six) hours as needed for moderate pain.     No facility-administered medications prior to visit.    PAST MEDICAL HISTORY: Past Medical History  Diagnosis Date  . Gynecological examination     sees Dr. Josefa Half  . Migraines   . GERD (gastroesophageal reflux disease)   . Murmur, cardiac   .  Anxiety   . Depression   . Diabetes mellitus without complication ([place])     PAST SURGICAL HISTORY: Past Surgical History  Procedure Laterality Date  . Cesarean section      FAMILY HISTORY: Family History  Problem Relation Age of Onset  . Hyperlipidemia Mother   . Stroke Mother   . Diabetes Mother   . Migraines Mother   . Migraines Maternal Aunt   . Migraines Maternal Grandmother   . Cerebral aneurysm Cousin   . Diabetes Father   . Diabetes Maternal Grandfather   . Heart disease Neg Hx     SOCIAL HISTORY: Social History   Social History  . Marital Status: Married    Spouse Name: N/A  . Number of Children: 1  . Years of Education: N/A   Occupational History  . Not on file.   Social History Main Topics  . Smoking status: Never Smoker   . Smokeless tobacco: Never Used  . Alcohol Use: No  . Drug Use: No  . Sexual Activity: Yes    Birth Control/ Protection: None   Other Topics Concern  . Not on file   Social History Narrative     PHYSICAL EXAM  Filed Vitals:   03/11/15 1053  BP: 121/69  Pulse: 74  Height: 5' 6"  (1.676 m)  Weight: 196 lb 9.6 oz (89.177 kg)   Body mass index is 31.75 kg/(m^2). General: The patient is alert and cooperative at the time of the examination. The patient is moderately obese. Skin: No significant peripheral edema is noted.  Neurologic Exam Mental status: The patient is oriented x 3. Cranial nerves: Facial symmetry is present. Speech is normal, no aphasia or dysarthria is noted. Extraocular movements are full. Visual fields are full. Motor: The patient has good strength in all 4 extremities. Sensory examination: Soft touch sensation is symmetric on the face, arms, and legs. Coordination: The patient has good finger-nose-finger and heel-to-shin bilaterally. Gait and station: The patient has a normal gait. Tandem gait is normal. Romberg is negative. No drift is seen. Reflexes: Deep tendon reflexes are symmetric upper and  lower  DIAGNOSTIC DATA (LABS, IMAGING, TESTING) - I reviewed patient records, labs, notes, testing and imaging myself where available.  Lab Results  Component Value Date   WBC 6.0 12/21/2014   HGB 7.9* 12/21/2014   HCT 27.9* 12/21/2014   MCV 74.4* 12/21/2014   PLT 285 12/21/2014      Component Value Date/Time   NA 138 12/21/2014 1116   K 3.7 12/21/2014 1116   CL 105 12/21/2014 1116   CO2 22 12/21/2014 1116   GLUCOSE 146* 12/21/2014 1116   BUN 9 12/21/2014 1116   CREATININE 0.58 12/21/2014 1116   CALCIUM 9.2 12/21/2014 1116   PROT 7.3 12/21/2014 1116   ALBUMIN 4.0 12/21/2014 1116   AST 132* 12/21/2014 1116   ALT 108* 12/21/2014 1116   ALKPHOS 83 12/21/2014 1116   BILITOT 0.6 12/21/2014 1116   GFRNONAA >60 12/21/2014 1116  GFRAA >60 12/21/2014 1116    Lab Results  Component Value Date   HGBA1C 9.3* 11/20/2014     ASSESSMENT AND PLAN  34 y.o. year old female  has a past medical history of  Migraines;  Anxiety; Depression; and Diabetes mellitus without complication ([place]). Here to follow-up.  Continue Inderal at current dose will refill Continue Maxalt acutely will refill Call for increase in headaches Follow-up yearly and when necessary Dennie Bible, [facility], [facility], [facility] [address], [place], [place] [address] [phone]

## 2015-03-11 NOTE — Progress Notes (Signed)
I have read the note, and I agree with the clinical assessment and plan.  Saniyya Gau KEITH   

## 2015-03-11 NOTE — Patient Instructions (Signed)
Continue Inderal at current dose will refill Continue Maxalt acutely will refill Call for increase in headaches Follow-up yearly and when necessary

## 2015-03-11 NOTE — Telephone Encounter (Signed)
I sent script e-scribe and spoke with pt. 

## 2015-03-18 ENCOUNTER — Ambulatory Visit (INDEPENDENT_AMBULATORY_CARE_PROVIDER_SITE_OTHER): Payer: BLUE CROSS/BLUE SHIELD | Admitting: Family Medicine

## 2015-03-18 ENCOUNTER — Encounter: Payer: Self-pay | Admitting: Family Medicine

## 2015-03-18 VITALS — BP 128/80 | Temp 98.2°F | Ht 66.0 in | Wt 196.0 lb

## 2015-03-18 DIAGNOSIS — J019 Acute sinusitis, unspecified: Secondary | ICD-10-CM | POA: Diagnosis not present

## 2015-03-18 MED ORDER — ZOLPIDEM TARTRATE 10 MG PO TABS: 10.0000 mg | ORAL_TABLET | Freq: Every evening | ORAL | Status: AC | PRN

## 2015-03-18 MED ORDER — METHYLPREDNISOLONE 4 MG PO TBPK: ORAL_TABLET | ORAL | Status: DC

## 2015-03-18 NOTE — Progress Notes (Signed)
   Subjective:    Patient ID: Laurie Sutton, female    DOB: 08/09/1980, 34 y.o.   MRN: [id number]  HPI Here for 3 and 1/2 weeks of sinus congestion, PND, and dry cough. She has taken a course of Ceftin, and course of Levaquin, and now 6 days of Doxycycline, and she has not improved much at all. No fever. Drinking fluids. Using Mucinex.   Review of Systems  Constitutional: Negative.   HENT: Positive for congestion, postnasal drip and sinus pressure. Negative for ear pain and sore throat.   Eyes: Negative.   Respiratory: Positive for cough.        Objective:   Physical Exam  Constitutional: She appears well-developed and well-nourished.  HENT:  Right Ear: External ear normal.  Left Ear: External ear normal.  Nose: Nose normal.  Mouth/Throat: Oropharynx is clear and moist.  Eyes: Conjunctivae are normal.  Neck: No thyromegaly present.  Pulmonary/Chest: Effort normal and breath sounds normal.  Lymphadenopathy:    She has no cervical adenopathy.          Assessment & Plan:  Partially treated sinusitis. Finish out the full 10 days of Doxycycline. Given a Medrol dose pack. Try Zolpidem 10 mg to help with sleep.

## 2015-03-18 NOTE — Progress Notes (Signed)
Pre visit review using our clinic review tool, if applicable. No additional management support is needed unless otherwise documented below in the visit note. 

## 2015-04-02 ENCOUNTER — Telehealth: Payer: Self-pay | Admitting: Family Medicine

## 2015-04-02 NOTE — Telephone Encounter (Signed)
Laurie Sutton called saying she finished taking antibiotics 1-2 weeks ago and now has a yeast infection. She's wondering if a Rx can be sent to her pharmacy for this. She said she'll need a much stronger Rx than what Dr. Sarajane Jews usually gives because she usually has to go through it 2-3 times. She'd like a phone call regarding this.  Pt's ph# [phone] Thank you.

## 2015-04-03 NOTE — Telephone Encounter (Signed)
There is nothing stronger than Diflucan. Call this in for 150 mg to take daily prn, #10 with 5 rf

## 2015-04-04 ENCOUNTER — Other Ambulatory Visit: Payer: Self-pay | Admitting: Endocrinology

## 2015-04-04 MED ORDER — FLUCONAZOLE 150 MG PO TABS: 150.0000 mg | ORAL_TABLET | Freq: Once | ORAL | Status: DC

## 2015-04-04 NOTE — Telephone Encounter (Signed)
I sent script e-scribe and left a voice message with below information.

## 2015-04-16 ENCOUNTER — Other Ambulatory Visit: Payer: Self-pay | Admitting: Endocrinology

## 2015-05-12 ENCOUNTER — Other Ambulatory Visit (INDEPENDENT_AMBULATORY_CARE_PROVIDER_SITE_OTHER): Payer: BLUE CROSS/BLUE SHIELD

## 2015-05-12 DIAGNOSIS — E1165 Type 2 diabetes mellitus with hyperglycemia: Secondary | ICD-10-CM

## 2015-05-12 DIAGNOSIS — IMO0001 Reserved for inherently not codable concepts without codable children: Secondary | ICD-10-CM

## 2015-05-12 LAB — MICROALBUMIN / CREATININE URINE RATIO
CREATININE, U: 160.2 mg/dL
MICROALB UR: 2 mg/dL — AB (ref 0.0–1.9)
Microalb Creat Ratio: 1.2 mg/g (ref 0.0–30.0)

## 2015-05-12 LAB — COMPREHENSIVE METABOLIC PANEL
ALT: 30 U/L (ref 0–35)
AST: 39 U/L — AB (ref 0–37)
Albumin: 4.3 g/dL (ref 3.5–5.2)
Alkaline Phosphatase: 76 U/L (ref 39–117)
BUN: 8 mg/dL (ref 6–23)
CHLORIDE: 102 meq/L (ref 96–112)
CO2: 26 meq/L (ref 19–32)
CREATININE: 0.51 mg/dL (ref 0.40–1.20)
Calcium: 9.1 mg/dL (ref 8.4–10.5)
GFR: 146.49 mL/min (ref >60.00)
Glucose, Bld: 90 mg/dL (ref 70–99)
Potassium: 4 mEq/L (ref 3.5–5.1)
SODIUM: 137 meq/L (ref 135–145)
Total Bilirubin: 0.4 mg/dL (ref 0.2–1.2)
Total Protein: 7.6 g/dL (ref 6.0–8.3)

## 2015-05-12 LAB — LIPID PANEL
CHOL/HDL RATIO: 4
Cholesterol: 176 mg/dL (ref 0–200)
HDL: 41.1 mg/dL (ref >39.00)
LDL CALC: 97 mg/dL (ref 0–99)
NonHDL: 134.66
TRIGLYCERIDES: 189 mg/dL — AB (ref 0.0–149.0)
VLDL: 37.8 mg/dL (ref 0.0–40.0)

## 2015-05-13 LAB — FRUCTOSAMINE: FRUCTOSAMINE: 229 umol/L (ref 0–285)

## 2015-05-16 ENCOUNTER — Encounter: Payer: Self-pay | Admitting: Endocrinology

## 2015-05-16 ENCOUNTER — Other Ambulatory Visit: Payer: Self-pay | Admitting: *Deleted

## 2015-05-16 ENCOUNTER — Ambulatory Visit (INDEPENDENT_AMBULATORY_CARE_PROVIDER_SITE_OTHER): Payer: BLUE CROSS/BLUE SHIELD | Admitting: Endocrinology

## 2015-05-16 VITALS — BP 108/64 | HR 77 | Temp 98.0°F | Resp 16 | Ht 66.0 in | Wt 207.0 lb

## 2015-05-16 DIAGNOSIS — E119 Type 2 diabetes mellitus without complications: Secondary | ICD-10-CM | POA: Diagnosis not present

## 2015-05-16 LAB — POCT GLYCOSYLATED HEMOGLOBIN (HGB A1C): Hemoglobin A1C: 5.6

## 2015-05-16 MED ORDER — METFORMIN HCL ER 500 MG PO TB24: 1500.0000 mg | ORAL_TABLET | Freq: Every day | ORAL | Status: DC

## 2015-05-16 MED ORDER — DULAGLUTIDE 0.75 MG/0.5ML ~~LOC~~ SOAJ: SUBCUTANEOUS | Status: DC

## 2015-05-16 MED ORDER — GLIPIZIDE ER 5 MG PO TB24: 5.0000 mg | ORAL_TABLET | Freq: Every day | ORAL | Status: DC

## 2015-05-16 NOTE — Patient Instructions (Addendum)
Check blood sugars on waking up 2-3  times a week  Also check blood sugars about 2 hours after a meal and do this after different meals by rotation  Recommended blood sugar levels on waking up is 90-130 and about 2 hours after meal is 130-160  Please bring your blood sugar monitor to each visit, thank you  Restart metformin and work up to 3 with dinner

## 2015-05-16 NOTE — Progress Notes (Signed)
Patient ID: Laurie Sutton, female   DOB: 05-11-1980, 35 y.o.   MRN: [id number]           Reason for Appointment: Follow-up for Type 2 Diabetes  Referring physician: Sarajane Jews  History of Present Illness:          Date of diagnosis of type 2 diabetes mellitus: 11/2013        Background history:  She had gestational diabetes treated with oral medications during her second pregnancy She may have been having borderline glucose levels in 2014 when followed up by her gynecologist In 8/15 she was found to have glycosuria but her gynecologist but apparently had no symptoms of unusual fatigue, weight loss, thirst or increased urination. Her glucose was 332 and A1c 12% She has been on metformin since diagnosis.  Initially was taking 1000 mg twice a day and this did not cause any side effects in the first 6 months. She was also given glipizide and with this combination her blood sugars improved significantly with fasting blood sugars in the 90-110 range and A1c down to 6.5 Subsequently with metformin she had diarrhea and Januvia did not help her sugar  Recent history:       On her initial consultation she was given a trial of metformin ER in gradually increasing doses but also started on Trulicity to help with multiple issues with her weight gain and portion control She had nausea only the first week of Trulicity but has been tolerating 0.75 mg weekly in-stent Has not been seen in follow-up since then However her blood sugars have been improved significantly Glipizide ER has been started instead of regular glipizide and she is taking this once a day  Current blood sugar patterns and problems identified:  She has had blood sugars in the mornings that are mildly increased over 100  Has not done any readings after meals as directed  She had lost a significant amount of weight after starting Trulicity and also she says she was benefiting from regular walking  In December she stopped walking and  went off her diet  Although she has had a weight gain she appears to have consistently good control now and fructosamine is excellent  A1c is now normal  She is now tolerating metformin ER better and has less diarrhea.  She thinks she is comfortable continuing this She did not establish a consultation  with dietitian  Oral hypoglycemic drugs the patient is taking are:  pcb hs    Side effects from medications have been: Diarrhea from regular metformin  Compliance with the medical regimen: Variable Hypoglycemia:   none, previously had only occasional readings around 70  Glucose monitoring:  done 1 times a day         Glucometer: One Touch.      Blood Glucose readings by download show fasting range 90-144 with median 114   Self-care: The diet that the patient has been following is: tries to limit bread and sugars, more vegetables, avoiding regular colas. She is eating out 3-4 times a week at fast food restaurants      Typical meal intake: Breakfast is variable, may have a bacon sandwich, cereal bar or peanut butter crackers.  Lunch is a sandwich or leftovers.  Usually having baked meats at home. For snacks will have cheese or peanut butter crackers, fruit and dry cereal        Dietician visit, most recent: Never  Exercise: some walking, up to 30 minutes, 5 days a week  Weight history: Previous range 190-220  Wt Readings from Last 3 Encounters:  05/16/15 207 lb (93.895 kg)  03/18/15 196 lb (88.905 kg)  03/11/15 196 lb 9.6 oz (89.177 kg)    Glycemic control:   Lab Results  Component Value Date   HGBA1C 5.6 05/16/2015   HGBA1C 9.3* 11/20/2014   HGBA1C 6.5 02/26/2014   Lab Results  Component Value Date   MICROALBUR 2.0* 05/12/2015   LDLCALC 97 05/12/2015   CREATININE 0.51 05/12/2015         Medication List       This list is accurate as of: 05/16/15  3:08 PM.  Always use your most recent med list.               Dulaglutide 0.75 MG/0.5ML Sopn    Commonly known as:  TRULICITY  INJECT CONTENTS WEEKLY     escitalopram 10 MG tablet  Commonly known as:  LEXAPRO  Take 1 tablet (10 mg total) by mouth daily.     glipiZIDE 5 MG 24 hr tablet  Commonly known as:  GLUCOTROL XL  Take 1 tablet (5 mg total) by mouth daily with breakfast.     losartan 50 MG tablet  Commonly known as:  COZAAR  TAKE 1 TABLET EVERY DAY     meclizine 25 MG tablet  Commonly known as:  ANTIVERT  Take 1 tablet (25 mg total) by mouth every 4 (four) hours as needed for dizziness.     metFORMIN 500 MG 24 hr tablet  Commonly known as:  GLUCOPHAGE-XR  Take 3 tablets (1,500 mg total) by mouth daily with supper.     omeprazole 40 MG capsule  Commonly known as:  PRILOSEC  TAKE 1 CAPSULE (40 MG TOTAL) BY MOUTH 2 (TWO) TIMES DAILY AS NEEDED.     ONE TOUCH LANCETS Misc  Test once per day and diagnosis code is 250.00     ONETOUCH VERIO test strip  Generic drug:  glucose blood  USE AS INSTRUCTED TO CHECK BLOOD SUGAR ONCE A DAY DX CODE E11.9     propranolol 20 MG tablet  Commonly known as:  INDERAL  Take 1 tablet (20 mg total) by mouth 2 (two) times daily.     rizatriptan 10 MG tablet  Commonly known as:  MAXALT  Take 1 tablet (10 mg total) by mouth 3 (three) times daily as needed.        Allergies:  Allergies  Allergen Reactions  . Amoxicillin Hives and Itching  . Januvia [Sitagliptin] Other (See Comments)    Body aches all day  . Penicillins Hives    Has patient had a PCN reaction causing immediate rash, facial/tongue/throat swelling, SOB or lightheadedness with hypotension: yes. Rash  Has patient had a PCN reaction causing severe rash involving mucus membranes or skin necrosis: Yes- rash and hives all over body  Has patient had a PCN reaction that required hospitalization No Has patient had a PCN reaction occurring within the last 10 years: Yes  If all of the above answers are "NO", then may proceed with Cephalosporin use.     Past Medical  History  Diagnosis Date  . Gynecological examination     sees Dr. Josefa Half  . Migraines   . GERD (gastroesophageal reflux disease)   . Murmur, cardiac   . Anxiety   . Depression   . Diabetes mellitus without complication ([place])  Past Surgical History  Procedure Laterality Date  . Cesarean section      Family History  Problem Relation Age of Onset  . Hyperlipidemia Mother   . Stroke Mother   . Diabetes Mother   . Migraines Mother   . Migraines Maternal Aunt   . Migraines Maternal Grandmother   . Cerebral aneurysm Cousin   . Diabetes Father   . Diabetes Maternal Grandfather   . Heart disease Neg Hx     Social History:  reports that she has never smoked. She has never used smokeless tobacco. She reports that she does not drink alcohol or use illicit drugs.    Review of Systems    Lipid history: unknown    Lab Results  Component Value Date   CHOL 176 05/12/2015   HDL 41.10 05/12/2015   LDLCALC 97 05/12/2015   TRIG 189.0* 05/12/2015   CHOLHDL 4 05/12/2015           Constitutional: no recent weight gain/loss, no complaints of unusual fatigue   Eyes: no history of blurred vision.  Most recent eye exam was in 2014  ENT: Has no difficulty swallowing, no hoarseness   Cardiovascular: no chest pain or tightness on exertion.   No leg swelling.  Hypertension: None.  She was told to take losartan for kidney protection, had cough from lisinopril  Respiratory: no cough/shortness of breath  Gastrointestinal: no change in bowel habits, nausea or abdominal pain  Musculoskeletal: no muscle/joint aches   Urological:   No frequency of urination or  nocturia  Skin: no rash or infections  Neurological:  Has had migraine headaches.   On a couple of occasions has had a feeling of numbness or tingling in feet and once in the hands, not having any problems recently  Psychiatric: no symptoms of depression  Endocrine: No unusual fatigue or history of thyroid  disease   Physical Examination:  BP 108/64 mmHg  Pulse 77  Temp(Src) 98 F (36.7 C)  Resp 16  Ht 5' 6"  (1.676 m)  Wt 207 lb (93.895 kg)  BMI 33.43 kg/m2  SpO2 98%  GENERAL:         Patient has generalized obesity.   HEENT:         Eye exam shows normal external appearance. Fundus exam shows no retinopathy. Oral exam shows normal mucosa .  NECK:   she has mild acanthosis present There is no lymphadenopathy Thyroid is not enlarged and no nodules felt.  Carotids are normal to palpation and no bruit heard LUNGS:         Chest is symmetrical. Lungs are clear to auscultation.Marland Kitchen   HEART:         Heart sounds:  S1 and S2 are normal. No murmur or click heard., no S3 or S4.   ABDOMEN:   There is no distention present. Liver and spleen are not palpable. No other mass or tenderness present.   NEUROLOGICAL:   Vibration sense is minimally reduced in distal first toes. Ankle jerks are normal bilaterally.          Monofilament sensation normal in the toes MUSCULOSKELETAL:  There is no swelling or deformity of the peripheral joints. Spine is normal to inspection.   EXTREMITIES:     There is no edema. No skin lesions present.Marland Kitchen SKIN:       No rash or other lesions on extremities       ASSESSMENT:  Diabetes type 2, uncontrolled with BMI 30-35  She has been on a regimen of Trulicity, metformin and glipizide ER Her blood sugars are excellent now with normal A1c and fructosamine Although she is not monitoring after meals her A1c probably indicates consistent control throughout the day More recently has gained weight because of inconsistent diet and lack of exercise  PLAN:   Continue metformin and may try to take 3 tablets daily consistently Continue Trulicity 8.94 weekly No change in glipizide 5 mg she will call if she has relatively lower blood sugars again Restart regular walking program  Alternate fasting and postprandial readings  Consultation with dietitian  Patient Instructions   Check blood sugars on waking up 2-3  times a week  Also check blood sugars about 2 hours after a meal and do this after different meals by rotation  Recommended blood sugar levels on waking up is 90-130 and about 2 hours after meal is 130-160  Please bring your blood sugar monitor to each visit, thank you  Restart metformin and work up to 3 with dinner    Magdalen Cabana 05/16/2015, 3:08 PM   Note: This office note was prepared with Dragon voice recognition system technology. Any transcriptional errors that result from this process are unintentional.

## 2015-05-19 ENCOUNTER — Ambulatory Visit (INDEPENDENT_AMBULATORY_CARE_PROVIDER_SITE_OTHER): Payer: BLUE CROSS/BLUE SHIELD | Admitting: Family Medicine

## 2015-05-19 ENCOUNTER — Encounter: Payer: Self-pay | Admitting: Family Medicine

## 2015-05-19 VITALS — BP 135/80 | HR 76 | Temp 98.6°F | Ht 66.0 in | Wt 206.0 lb

## 2015-05-19 DIAGNOSIS — J020 Streptococcal pharyngitis: Secondary | ICD-10-CM

## 2015-05-19 DIAGNOSIS — J019 Acute sinusitis, unspecified: Secondary | ICD-10-CM | POA: Diagnosis not present

## 2015-05-19 LAB — POCT RAPID STREP A (OFFICE): Rapid Strep A Screen: NEGATIVE

## 2015-05-19 MED ORDER — METHYLPREDNISOLONE 4 MG PO TBPK: ORAL_TABLET | ORAL | Status: DC

## 2015-05-19 MED ORDER — LEVOFLOXACIN 500 MG PO TABS: 500.0000 mg | ORAL_TABLET | Freq: Every day | ORAL | Status: AC

## 2015-05-19 NOTE — Progress Notes (Signed)
Pre visit review using our clinic review tool, if applicable. No additional management support is needed unless otherwise documented below in the visit note. 

## 2015-05-19 NOTE — Progress Notes (Signed)
   Subjective:    Patient ID: Laurie Sutton, female    DOB: 06/03/1980, 35 y.o.   MRN: [id number]  HPI Here for 5 days of sinus pressure, PND, a bad ST, and coughing up yellow sputum. No fever.    Review of Systems  Constitutional: Negative.   HENT: Positive for congestion, postnasal drip, sinus pressure and sore throat. Negative for ear pain.   Eyes: Negative.   Respiratory: Positive for cough.        Objective:   Physical Exam  Constitutional: She appears well-developed and well-nourished.  HENT:  Right Ear: External ear normal.  Left Ear: External ear normal.  Nose: Nose normal.  Mouth/Throat: Oropharynx is clear and moist.  Eyes: Conjunctivae are normal.  Neck: No thyromegaly present.  Pulmonary/Chest: Effort normal and breath sounds normal.  Lymphadenopathy:    She has no cervical adenopathy.          Assessment & Plan:  Sinusitis, treat with Levaquin.

## 2015-05-29 ENCOUNTER — Encounter: Payer: Self-pay | Admitting: Family Medicine

## 2015-05-30 MED ORDER — ESOMEPRAZOLE MAGNESIUM 20 MG PO CPDR: 20.0000 mg | DELAYED_RELEASE_CAPSULE | Freq: Every day | ORAL | Status: DC

## 2015-05-30 NOTE — Telephone Encounter (Signed)
I sent script e-scribe to local [facility] and spoke with pt.

## 2015-05-30 NOTE — Progress Notes (Signed)
Pt following up on RX.  Pt will not have insurance after today, and needs this called in asap. Pt has tried this med because her husband takes, and it works better.  [facility]/ [address]

## 2015-05-30 NOTE — Telephone Encounter (Signed)
Stop Omeprazole and call in Esomeprazole 20 mg daily, #90 with 3 rf

## 2015-07-18 ENCOUNTER — Other Ambulatory Visit: Payer: Self-pay | Admitting: Endocrinology

## 2015-07-24 ENCOUNTER — Other Ambulatory Visit: Payer: Self-pay | Admitting: *Deleted

## 2015-07-24 MED ORDER — BAYER CONTOUR NEXT MONITOR W/DEVICE KIT: PACK | Status: DC

## 2015-07-24 MED ORDER — BAYER MICROLET LANCETS MISC: Status: DC

## 2015-07-24 MED ORDER — GLUCOSE BLOOD VI STRP: ORAL_STRIP | Status: DC

## 2015-08-08 ENCOUNTER — Telehealth: Payer: Self-pay | Admitting: Family Medicine

## 2015-08-08 ENCOUNTER — Encounter: Payer: Self-pay | Admitting: Family Medicine

## 2015-08-08 ENCOUNTER — Ambulatory Visit (INDEPENDENT_AMBULATORY_CARE_PROVIDER_SITE_OTHER): Payer: BLUE CROSS/BLUE SHIELD | Admitting: Family Medicine

## 2015-08-08 VITALS — BP 114/64 | HR 72 | Temp 98.2°F | Ht 66.0 in | Wt 203.0 lb

## 2015-08-08 DIAGNOSIS — B349 Viral infection, unspecified: Secondary | ICD-10-CM | POA: Diagnosis not present

## 2015-08-08 MED ORDER — METHYLPREDNISOLONE 4 MG PO TBPK: ORAL_TABLET | ORAL | Status: DC

## 2015-08-08 NOTE — Telephone Encounter (Signed)
FYI.  Patient has an appointment.

## 2015-08-08 NOTE — Progress Notes (Signed)
Pre visit review using our clinic review tool, if applicable. No additional management support is needed unless otherwise documented below in the visit note. 

## 2015-08-08 NOTE — Progress Notes (Signed)
   Subjective:    Patient ID: Laurie Sutton, female    DOB: 1980-08-10, 35 y.o.   MRN: [id number]  HPI Here for 2 days of soreness in the back of the mouth and on the tongue, also with some mild body aches, and nausea without vomiting. No diarrhea or cough or fever.    Review of Systems  Constitutional: Negative.   HENT: Positive for sore throat. Negative for congestion, ear pain, mouth sores, postnasal drip and sinus pressure.   Eyes: Negative.   Respiratory: Negative.   Cardiovascular: Negative.   Gastrointestinal: Positive for nausea. Negative for vomiting, abdominal pain, diarrhea, constipation, blood in stool, abdominal distention, anal bleeding and rectal pain.  Genitourinary: Negative.   Neurological: Negative.        Objective:   Physical Exam  Constitutional: She appears well-developed and well-nourished. No distress.  HENT:  Right Ear: External ear normal.  Left Ear: External ear normal.  Nose: Nose normal.  Mouth/Throat: No oropharyngeal exudate.  Slight erythema in the posterior OP  Eyes: Conjunctivae are normal.  Neck: Neck supple. No thyromegaly present.  Cardiovascular: Normal rate, regular rhythm, normal heart sounds and intact distal pulses.   Pulmonary/Chest: Effort normal and breath sounds normal. No respiratory distress. She has no wheezes. She has no rales. She exhibits no tenderness.  Lymphadenopathy:    She has no cervical adenopathy.          Assessment & Plan:  Viral illness, drink fluids. Given a Medrol dose pack to help with symptoms. She has Zofran at home to use prn.  Laurey Morale, MD

## 2015-08-08 NOTE — Telephone Encounter (Signed)
[facility] - Client [facility]  Patient Name: Laurie Sutton  DOB: April 05, 1981    Initial Comment Caller states c/o runny nose, headaches, sore throat with bumps on tongue and roof of mouth, hard to swallow   Nurse Assessment  Nurse: Wayne Sever, RN, Tillie Rung Date/Time (Eastern Time): 08/08/2015 11:46:37 AM  Confirm and document reason for call. If symptomatic, describe symptoms. You must click the next button to save text entered. ---Caller states she has bumps on the back of her tongue and roof of her mouth, she states it hurts in the back where the bumps are. She denies any sore throat. She states it hurts to swallow, she states she also has a headache and hart to swallow at times.  Has the patient traveled out of the country within the last 30 days? ---No  Does the patient have any new or worsening symptoms? ---Yes  Will a triage be completed? ---Yes  Related visit to physician within the last 2 weeks? ---No  Does the PT have any chronic conditions? (i.e. diabetes, asthma, etc.) ---Yes  List chronic conditions. ---Type 2 Diabetes  Is the patient pregnant or possibly pregnant? (Ask all females between the ages of 93-55) ---No  Is this a behavioral health or substance abuse call? ---No     Guidelines    Guideline Title Affirmed Question Affirmed Notes  Mouth Ulcers 4 or more ulcers    Final Disposition User   See PCP When Office is Open (within 3 days) Wayne Sever, RN, Tillie Rung    Comments  Attemtped to schedule with Dr Sarajane Jews but [employer] said he had reached his limit even through the slot was still open. Scheduled with Dorothyann Peng at 315p today   Referrals  REFERRED TO PCP OFFICE   Disagree/Comply: Comply

## 2015-08-12 ENCOUNTER — Other Ambulatory Visit: Payer: Self-pay | Admitting: Endocrinology

## 2015-08-12 ENCOUNTER — Other Ambulatory Visit: Payer: BLUE CROSS/BLUE SHIELD

## 2015-08-15 ENCOUNTER — Ambulatory Visit: Payer: BLUE CROSS/BLUE SHIELD | Admitting: Endocrinology

## 2015-08-21 ENCOUNTER — Other Ambulatory Visit (INDEPENDENT_AMBULATORY_CARE_PROVIDER_SITE_OTHER): Payer: BLUE CROSS/BLUE SHIELD

## 2015-08-21 DIAGNOSIS — E119 Type 2 diabetes mellitus without complications: Secondary | ICD-10-CM | POA: Diagnosis not present

## 2015-08-21 LAB — HEMOGLOBIN A1C: HEMOGLOBIN A1C: 6 % (ref 4.6–6.5)

## 2015-08-21 LAB — BASIC METABOLIC PANEL
BUN: 5 mg/dL — ABNORMAL LOW (ref 6–23)
CALCIUM: 9 mg/dL (ref 8.4–10.5)
CO2: 22 meq/L (ref 19–32)
CREATININE: 0.53 mg/dL (ref 0.40–1.20)
Chloride: 103 mEq/L (ref 96–112)
GFR: 139.9 mL/min (ref >60.00)
Glucose, Bld: 189 mg/dL — ABNORMAL HIGH (ref 70–99)
Potassium: 3.4 mEq/L — ABNORMAL LOW (ref 3.5–5.1)
SODIUM: 137 meq/L (ref 135–145)

## 2015-08-26 LAB — HM DIABETES EYE EXAM

## 2015-08-29 ENCOUNTER — Ambulatory Visit (INDEPENDENT_AMBULATORY_CARE_PROVIDER_SITE_OTHER): Payer: BLUE CROSS/BLUE SHIELD | Admitting: Endocrinology

## 2015-08-29 ENCOUNTER — Other Ambulatory Visit: Payer: Self-pay | Admitting: Endocrinology

## 2015-08-29 ENCOUNTER — Encounter: Payer: Self-pay | Admitting: Endocrinology

## 2015-08-29 VITALS — BP 128/84 | HR 84 | Wt 203.0 lb

## 2015-08-29 DIAGNOSIS — E119 Type 2 diabetes mellitus without complications: Secondary | ICD-10-CM

## 2015-08-29 DIAGNOSIS — E876 Hypokalemia: Secondary | ICD-10-CM | POA: Diagnosis not present

## 2015-08-29 MED ORDER — CANAGLIFLOZIN 100 MG PO TABS: ORAL_TABLET | ORAL | Status: DC

## 2015-08-29 NOTE — Patient Instructions (Signed)
No Losartan or Trulicity  Check blood sugars on waking up 3  times a week Also check blood sugars about 2 hours after a meal and do this after different meals by rotation  Recommended blood sugar levels on waking up is 90-130 and about 2 hours after meal is 130-160  Please bring your blood sugar monitor to each visit, thank you

## 2015-08-29 NOTE — Progress Notes (Signed)
Patient ID: SHAHD OCCHIPINTI, female   DOB: 30-Jan-1981, 35 y.o.   MRN: [id number]           Reason for Appointment: Follow-up for Type 2 Diabetes  Referring physician: Sarajane Jews  History of Present Illness:          Date of diagnosis of type 2 diabetes mellitus: 11/2013        Background history:  She had gestational diabetes treated with oral medications during her second pregnancy She may have been having borderline glucose levels in 2014 when followed up by her gynecologist In 8/15 she was found to have glycosuria but her gynecologist but apparently had no symptoms of unusual fatigue, weight loss, thirst or increased urination. Her glucose was 332 and A1c 12% She has been on metformin since diagnosis.  Initially was taking 1000 mg twice a day and this did not cause any side effects in the first 6 months. She was also given glipizide and with this combination her blood sugars improved significantly with fasting blood sugars in the 90-110 range and A1c down to 6.5 Subsequently with metformin she had diarrhea and Januvia did not help her sugar  Recent history:       On her initial consultation she was given a trial of metformin ER in gradually increasing doses but also started on Trulicity to help with multiple issues with her weight gain and portion control She is now here for her 3 month follow-up Although her A1c is still good at 6% it was down to 5.6 on the last visit  She is now saying that she is having abdominal cramping and mild nausea for the first 2-3 days after taking Trulicity which she did not have previously  Current blood sugar patterns and problems identified:  She has had excellent blood sugars FASTING and fairly consistent  Has only a couple of readings later in the day with only one high reading of 160 early afternoon  Lab glucose was 189 midmorning; she has not taken her Trulicity this week as yet  Her weight is down 3 pounds  She thinks she is still watching her  diet and trying to walk regularly  Oral hypoglycemic drugs the patient is taking are:   glipizide ER 5 mg daily, metformin ER 1500 mg daily   Side effects from medications have been: Diarrhea from regular metformin  Compliance with the medical regimen: Improved Hypoglycemia:   none  Glucose monitoring:  done 1 times a day         Glucometer: One Touch.      Blood Glucose readings by download show FASTING range 87-136, MEDIAN 111 Highest reading 160 at about 1 PM   Self-care: The diet that the patient has been following is: tries to limit bread and sugars, more vegetables, avoiding regular colas. She is eating out 1-2 times a week at fast food restaurants       Typical meal intake: Breakfast is variable, may have a toast, cereal bar or peanut butter crackers.   Lunch is a sandwich or leftovers.  Usually having baked meats at home. For snacks will have cheese or peanut butter crackers, fruit and dry cereal        Dietician visit, most recent: Never               Exercise: some walking, up to 30 minutes, 5 days a week  Weight history: Previous range 190-220  Wt Readings from Last 3 Encounters:  08/29/15 203 lb (92.08  kg)  08/08/15 203 lb (92.08 kg)  05/19/15 206 lb (93.441 kg)    Glycemic control:   Lab Results  Component Value Date   HGBA1C 6.0 08/21/2015   HGBA1C 5.6 05/16/2015   HGBA1C 9.3* 11/20/2014   Lab Results  Component Value Date   MICROALBUR 2.0* 05/12/2015   LDLCALC 97 05/12/2015   CREATININE 0.53 08/21/2015    No visits with results within 1 Week(s) from this visit. Latest known visit with results is:  Lab on 08/21/2015  Component Date Value Ref Range Status  . Hgb A1c MFr Bld 08/21/2015 6.0  4.6 - 6.5 % Final   Glycemic Control Guidelines for People with Diabetes:Non Diabetic:  <6%Goal of Therapy: <7%Additional Action Suggested:  >8%   . Sodium 08/21/2015 137  135 - 145 mEq/L Final  . Potassium 08/21/2015 3.4* 3.5 - 5.1 mEq/L Final  . Chloride  08/21/2015 103  96 - 112 mEq/L Final  . CO2 08/21/2015 22  19 - 32 mEq/L Final  . Glucose, Bld 08/21/2015 189* 70 - 99 mg/dL Final  . BUN 08/21/2015 5* 6 - 23 mg/dL Final  . Creatinine, Ser 08/21/2015 0.53  0.40 - 1.20 mg/dL Final  . Calcium 08/21/2015 9.0  8.4 - 10.5 mg/dL Final  . GFR 08/21/2015 139.90  >60.00 mL/min Final        Medication List       This list is accurate as of: 08/29/15 11:59 PM.  Always use your most recent med list.               BAYER CONTOUR NEXT MONITOR w/Device Kit  Use to check blood sugar once a day dx code E11.9     BAYER MICROLET LANCETS lancets  Use as instructed to check blood sugar once a day dx code E11.9     canagliflozin 100 MG Tabs tablet  Commonly known as:  INVOKANA  1 tablet before breakfast     Dulaglutide 0.75 MG/0.5ML Sopn  Commonly known as:  TRULICITY  INJECT CONTENTS WEEKLY     escitalopram 10 MG tablet  Commonly known as:  LEXAPRO  Take 1 tablet (10 mg total) by mouth daily.     esomeprazole 20 MG capsule  Commonly known as:  NEXIUM  Take 1 capsule (20 mg total) by mouth daily at 12 noon.     glucose blood test strip  Commonly known as:  BAYER CONTOUR NEXT TEST  Use as instructed to check blood sugar once a day dx code E11.9     losartan 50 MG tablet  Commonly known as:  COZAAR  TAKE 1 TABLET EVERY DAY     meclizine 25 MG tablet  Commonly known as:  ANTIVERT  Take 1 tablet (25 mg total) by mouth every 4 (four) hours as needed for dizziness.     metFORMIN 500 MG 24 hr tablet  Commonly known as:  GLUCOPHAGE-XR  Take 3 tablets (1,500 mg total) by mouth daily with supper.     metFORMIN 500 MG 24 hr tablet  Commonly known as:  GLUCOPHAGE-XR  TAKE 3 TABLETS (1,500 MG TOTAL) BY MOUTH DAILY WITH SUPPER.     methylPREDNISolone 4 MG Tbpk tablet  Commonly known as:  MEDROL DOSEPAK  As directed     propranolol 20 MG tablet  Commonly known as:  INDERAL  Take 1 tablet (20 mg total) by mouth 2 (two) times daily.       rizatriptan 10 MG tablet  Commonly known as:  MAXALT  Take 1 tablet (10 mg total) by mouth 3 (three) times daily as needed.        Allergies:  Allergies  Allergen Reactions  . Amoxicillin Hives and Itching  . Januvia [Sitagliptin] Other (See Comments)    Body aches all day  . Penicillins Hives    Has patient had a PCN reaction causing immediate rash, facial/tongue/throat swelling, SOB or lightheadedness with hypotension: yes. Rash  Has patient had a PCN reaction causing severe rash involving mucus membranes or skin necrosis: Yes- rash and hives all over body  Has patient had a PCN reaction that required hospitalization No Has patient had a PCN reaction occurring within the last 10 years: Yes  If all of the above answers are "NO", then may proceed with Cephalosporin use.     Past Medical History  Diagnosis Date  . Gynecological examination     sees Dr. Josefa Half  . Migraines   . GERD (gastroesophageal reflux disease)   . Murmur, cardiac   . Anxiety   . Depression   . Diabetes mellitus without complication [facility])     Past Surgical History  Procedure Laterality Date  . Cesarean section      Family History  Problem Relation Age of Onset  . Hyperlipidemia Mother   . Stroke Mother   . Diabetes Mother   . Migraines Mother   . Migraines Maternal Aunt   . Migraines Maternal Grandmother   . Cerebral aneurysm Cousin   . Diabetes Father   . Diabetes Maternal Grandfather   . Heart disease Neg Hx     Social History:  reports that she has never smoked. She has never used smokeless tobacco. She reports that she does not drink alcohol or use illicit drugs.    Review of Systems    Lipid history: LDL is below 100 without any statin drugs    Lab Results  Component Value Date   CHOL 176 05/12/2015   HDL 41.10 05/12/2015   LDLCALC 97 05/12/2015   TRIG 189.0* 05/12/2015   CHOLHDL 4 05/12/2015          She was told by her PCP that she needs to be on losartan for  kidney protection, reportedly no history of hypertension  She has a low potassium, has had some loose stools lately   Physical Examination:  BP 128/84 mmHg  Pulse 84  Wt 203 lb (92.08 kg)  SpO2 98%   ASSESSMENT:  Diabetes type 2, uncontrolled with BMI 30-35     She has been on a regimen of Trulicity, metformin and glipizide ER Her blood sugars are overall still well controlled with A1c 6% However she is now reporting abdominal discomfort, some loose stools and nausea for the first 2 or 3 days after taking Trulicity which she had tolerated well before that Although she is not monitoring after meals her fasting readings are fairly stable Also A1c probably indicates mostly consistent control, highest reading 189 in the lab  Currently her insurance does not cover Tanzeum and she prefers not to take her daily injection; she probably will not like the large injection needle with Bydureon  She is a good candidate for medication like Invokana Discussed action of SGLT 2 drugs on lowering glucose by decreasing kidney absorption of glucose, benefits of weight loss and lower blood pressure, possible side effects including candidiasis and dosage regimen   HYPOKALEMIA: May be related to recent GI symptoms, not on diuretics.  Also she did get steroids last month  ?  Hypertension: Her blood pressure is high normal today  PLAN:    Invokana 100 mg daily, given patient information brochure and co-pay card  She can stop Trulicity  Continue glipizide ER and metformin unchanged, may need to stop glipizide or reduce it if blood sugars get lower  Stop losartan for now as she may have lower blood pressure with this and Invokana together  Given her list of high potassium foods, her potassium may improve with reduced loose stools she is currently having  May consider reducing metformin if needed  Discussed need to check readings after meals since fasting readings are fairly good and she may be able  to adjust her diet better with monitoring postprandial sugars  Regular exercise  Follow-up in 6 weeks  Recheck BMP and blood pressure on the next visit  Patient Instructions  No Losartan or Trulicity  Check blood sugars on waking up 3  times a week Also check blood sugars about 2 hours after a meal and do this after different meals by rotation  Recommended blood sugar levels on waking up is 90-130 and about 2 hours after meal is 130-160  Please bring your blood sugar monitor to each visit, thank you     Counseling time on subjects discussed above is over 50% of today's 25 minute visit   Faye Strohman 08/31/2015, 9:30 PM   Note: This office note was prepared with Estate agent. Any transcriptional errors that result from this process are unintentional.

## 2015-09-05 ENCOUNTER — Encounter: Payer: Self-pay | Admitting: *Deleted

## 2015-09-30 ENCOUNTER — Other Ambulatory Visit: Payer: BLUE CROSS/BLUE SHIELD

## 2015-09-30 ENCOUNTER — Telehealth: Payer: Self-pay | Admitting: Endocrinology

## 2015-09-30 NOTE — Telephone Encounter (Signed)
PT is concerned because Dr. Dwyane Dee had put her on new medication and her sugars have been very high in the 200's lately.

## 2015-10-01 ENCOUNTER — Ambulatory Visit: Payer: BLUE CROSS/BLUE SHIELD | Admitting: Endocrinology

## 2015-10-01 NOTE — Telephone Encounter (Signed)
She can try Trulicity 4.38 mg weekly as before

## 2015-10-01 NOTE — Telephone Encounter (Signed)
I contacted the pt and advised of note below. Pt wanted to check if she could start back on the Trulicity and see if her stomach issues come back. PT stated she has 3 or 4 boxes for Trulicty on hand and would like to try the medication one more time before paying for another medication. Please advise, Thanks!

## 2015-10-01 NOTE — Telephone Encounter (Signed)
I contacted the pt and left a vm advising of note below. Requested a call back if the pt would like to discuss.

## 2015-10-01 NOTE — Telephone Encounter (Signed)
Likely high sugars from stopping Trulicity. Will need her to take Victoza daily. She may need to be instructed on how to do the injection and use the pen and the dosage titration from 0.6 up to 1.2 mg after 1 week.  She can see Vaughan Basta the diabetes nurse today if possible for this She can continue Invokana for now

## 2015-10-03 ENCOUNTER — Other Ambulatory Visit: Payer: Self-pay | Admitting: Family Medicine

## 2015-10-03 NOTE — Telephone Encounter (Signed)
I do not see this medication on current list?

## 2015-10-03 NOTE — Telephone Encounter (Signed)
Call in #30 with 5 rf

## 2015-11-03 ENCOUNTER — Other Ambulatory Visit: Payer: BLUE CROSS/BLUE SHIELD

## 2015-11-04 ENCOUNTER — Other Ambulatory Visit (INDEPENDENT_AMBULATORY_CARE_PROVIDER_SITE_OTHER): Payer: BLUE CROSS/BLUE SHIELD

## 2015-11-04 DIAGNOSIS — E119 Type 2 diabetes mellitus without complications: Secondary | ICD-10-CM

## 2015-11-04 LAB — BASIC METABOLIC PANEL
BUN: 7 mg/dL (ref 6–23)
CALCIUM: 9.4 mg/dL (ref 8.4–10.5)
CO2: 24 mEq/L (ref 19–32)
CREATININE: 0.52 mg/dL (ref 0.40–1.20)
Chloride: 103 mEq/L (ref 96–112)
GFR: 142.84 mL/min (ref >60.00)
Glucose, Bld: 171 mg/dL — ABNORMAL HIGH (ref 70–99)
Potassium: 3.6 mEq/L (ref 3.5–5.1)
Sodium: 136 mEq/L (ref 135–145)

## 2015-11-05 LAB — FRUCTOSAMINE: Fructosamine: 245 umol/L (ref 0–285)

## 2015-11-06 ENCOUNTER — Ambulatory Visit (INDEPENDENT_AMBULATORY_CARE_PROVIDER_SITE_OTHER): Payer: BLUE CROSS/BLUE SHIELD | Admitting: Endocrinology

## 2015-11-06 ENCOUNTER — Encounter: Payer: Self-pay | Admitting: Endocrinology

## 2015-11-06 VITALS — BP 128/62 | HR 72 | Wt 213.0 lb

## 2015-11-06 DIAGNOSIS — E1165 Type 2 diabetes mellitus with hyperglycemia: Secondary | ICD-10-CM | POA: Diagnosis not present

## 2015-11-06 MED ORDER — VICTOZA 18 MG/3ML ~~LOC~~ SOPN: 1.2000 mg | PEN_INJECTOR | Freq: Every day | SUBCUTANEOUS | 3 refills | Status: DC

## 2015-11-06 NOTE — Progress Notes (Signed)
Patient ID: Laurie Sutton, female   DOB: [birth year], 35 y.o.   MRN: [id number]           Reason for Appointment: Follow-up for Type 2 Diabetes  Referring physician: Sarajane Jews  History of Present Illness:          Date of diagnosis of type 2 diabetes mellitus: 11/2013        Background history:  She had gestational diabetes treated with oral medications during her second pregnancy She may have been having borderline glucose levels in 2014 when followed up by her gynecologist In 8/15 she was found to have glycosuria but her gynecologist but apparently had no symptoms of unusual fatigue, weight loss, thirst or increased urination. Her glucose was 332 and A1c 12% She has been on metformin since diagnosis.  Initially was taking 1000 mg twice a day and this did not cause any side effects in the first 6 months. She was also given glipizide and with this combination her blood sugars improved significantly with fasting blood sugars in the 90-110 range and A1c down to 6.5 Subsequently with metformin she had diarrhea and Januvia did not help her sugar  Recent history:        On her initial consultation she was given a trial of metformin ER in gradually increasing doses  She also started on Trulicity to help with multiple issues with her weight gain and portion control With this her blood sugars improved significantly and her weight was improving  Since she was having nausea and abdominal cramps with Trulicity she was switched to Melvin. However she thinks her blood sugars went up to 200 with switching and she did not want to continue She was told to consider Victoza but she did not do so She tried Trulicity again for a couple of weeks and because of side effects again she stopped this but her blood sugars appear to getting higher  However her fructosamine is not high, recently 245  Current management, blood sugar patterns and problems identified:  She has had gradually increasing fasting  readings now  She has not done any readings after meals especially in the evening  She has only a few readings around lunchtime earlier this month which were fairly good  She has gained 10 pounds  Recently she has not been exercising, has been traveling and eating out frequently  Oral hypoglycemic drugs the patient is taking are:   glipizide ER 5 mg daily, metformin ER 1500 mg daily   Side effects from medications have been: Diarrhea from regular metformin  Compliance with the medical regimen: Improved Hypoglycemia:   none  Glucose monitoring:  done 1 times a day         Glucometer: One Touch.      Blood Glucose readings by download show   Mean values apply above for all meters except median for One Touch  PRE-MEAL Fasting Lunch Dinner Bedtime Overall  Glucose range: 116-153  109-135     Mean/median: 139     134     Self-care: The diet that the patient has been following is: tries to limit bread and sugars, more vegetables, avoiding regular colas. She is eating out 1-2 times a week at fast food restaurants       Typical meal intake: Breakfast is variable, may have a toast, cereal bar or peanut butter crackers.   Lunch is a sandwich or leftovers.  Usually having baked meats at home. For snacks will have cheese or peanut  butter crackers, fruit and dry cereal         Dietician visit, most recent: Never               Exercise: none Recently  Weight history: Previous range 190-220  Wt Readings from Last 3 Encounters:  11/06/15 213 lb (96.6 kg)  08/29/15 203 lb (92.1 kg)  08/08/15 203 lb (92.1 kg)    Glycemic control:   Lab Results  Component Value Date   HGBA1C 6.0 08/21/2015   HGBA1C 5.6 05/16/2015   HGBA1C 9.3 (H) 11/20/2014   Lab Results  Component Value Date   MICROALBUR 2.0 (H) 05/12/2015   LDLCALC 97 05/12/2015   CREATININE 0.52 11/04/2015    Lab on 11/04/2015  Component Date Value Ref Range Status  . Sodium 11/04/2015 136  135 - 145 mEq/L Final  .  Potassium 11/04/2015 3.6  3.5 - 5.1 mEq/L Final  . Chloride 11/04/2015 103  96 - 112 mEq/L Final  . CO2 11/04/2015 24  19 - 32 mEq/L Final  . Glucose, Bld 11/04/2015 171* 70 - 99 mg/dL Final  . BUN 11/04/2015 7  6 - 23 mg/dL Final  . Creatinine, Ser 11/04/2015 0.52  0.40 - 1.20 mg/dL Final  . Calcium 11/04/2015 9.4  8.4 - 10.5 mg/dL Final  . GFR 11/04/2015 142.84  >60.00 mL/min Final  . Fructosamine 11/05/2015 245  0 - 285 umol/L Final   Comment: Published reference interval for apparently healthy subjects between age 41 and 42 is 14 - 285 umol/L and in a poorly controlled diabetic population is 228 - 563 umol/L with a mean of 396 umol/L.         Medication List       Accurate as of 11/06/15 10:29 AM. Always use your most recent med list.          BAYER CONTOUR NEXT MONITOR w/Device Kit Use to check blood sugar once a day dx code E11.9   BAYER MICROLET LANCETS lancets Use as instructed to check blood sugar once a day dx code E11.9   canagliflozin 100 MG Tabs tablet Commonly known as:  INVOKANA 1 tablet before breakfast   Dulaglutide 0.75 MG/0.5ML Sopn Commonly known as:  TRULICITY INJECT CONTENTS WEEKLY   escitalopram 10 MG tablet Commonly known as:  LEXAPRO Take 1 tablet (10 mg total) by mouth daily.   esomeprazole 20 MG capsule Commonly known as:  NEXIUM Take 1 capsule (20 mg total) by mouth daily at 12 noon.   glipiZIDE 5 MG 24 hr tablet Commonly known as:  GLUCOTROL XL TAKE 1 TABLET (5 MG TOTAL) BY MOUTH DAILY WITH BREAKFAST.   glucose blood test strip Commonly known as:  BAYER CONTOUR NEXT TEST Use as instructed to check blood sugar once a day dx code E11.9   losartan 50 MG tablet Commonly known as:  COZAAR TAKE 1 TABLET EVERY DAY   meclizine 25 MG tablet Commonly known as:  ANTIVERT Take 1 tablet (25 mg total) by mouth every 4 (four) hours as needed for dizziness.   metFORMIN 500 MG 24 hr tablet Commonly known as:  GLUCOPHAGE-XR Take 3 tablets  (1,500 mg total) by mouth daily with supper.   metFORMIN 500 MG 24 hr tablet Commonly known as:  GLUCOPHAGE-XR TAKE 3 TABLETS (1,500 MG TOTAL) BY MOUTH DAILY WITH SUPPER.   methylPREDNISolone 4 MG Tbpk tablet Commonly known as:  MEDROL DOSEPAK As directed   propranolol 20 MG tablet Commonly known as:  INDERAL Take 1  tablet (20 mg total) by mouth 2 (two) times daily.   rizatriptan 10 MG tablet Commonly known as:  MAXALT Take 1 tablet (10 mg total) by mouth 3 (three) times daily as needed.   zolpidem 10 MG tablet Commonly known as:  AMBIEN TAKE 1 TABLET BY MOUTH AT BEDTIME AS NEEDED FOR SLEEP       Allergies:  Allergies  Allergen Reactions  . Amoxicillin Hives and Itching  . Januvia [Sitagliptin] Other (See Comments)    Body aches all day  . Penicillins Hives    Has patient had a PCN reaction causing immediate rash, facial/tongue/throat swelling, SOB or lightheadedness with hypotension: yes. Rash  Has patient had a PCN reaction causing severe rash involving mucus membranes or skin necrosis: Yes- rash and hives all over body  Has patient had a PCN reaction that required hospitalization No Has patient had a PCN reaction occurring within the last 10 years: Yes  If all of the above answers are "NO", then may proceed with Cephalosporin use.     Past Medical History:  Diagnosis Date  . Anxiety   . Depression   . Diabetes mellitus without complication ([place])   . GERD (gastroesophageal reflux disease)   . Gynecological examination    sees Dr. Josefa Half  . Migraines   . Murmur, cardiac     Past Surgical History:  Procedure Laterality Date  . CESAREAN SECTION      Family History  Problem Relation Age of Onset  . Hyperlipidemia Mother   . Stroke Mother   . Diabetes Mother   . Migraines Mother   . Migraines Maternal Aunt   . Migraines Maternal Grandmother   . Cerebral aneurysm Cousin   . Diabetes Father   . Diabetes Maternal Grandfather   . Heart disease Neg Hx      Social History:  reports that she has never smoked. She has never used smokeless tobacco. She reports that she does not drink alcohol or use drugs.    Review of Systems    Lipid history: LDL is below 100 without any statin drugs    Lab Results  Component Value Date   CHOL 176 05/12/2015   HDL 41.10 05/12/2015   LDLCALC 97 05/12/2015   TRIG 189.0 (H) 05/12/2015   CHOLHDL 4 05/12/2015          She was told by her PCP that she needs to be on losartan for kidney protection, reportedly no history of hypertension  History of hypokalemia:  Lab Results  Component Value Date   CREATININE 0.52 11/04/2015   BUN 7 11/04/2015   NA 136 11/04/2015   K 3.6 11/04/2015   CL 103 11/04/2015   CO2 24 11/04/2015     Physical Examination:  BP 128/62 (BP Location: Left Arm, Patient Position: Sitting)   Pulse 72   Wt 213 lb (96.6 kg)   LMP 10/14/2015   SpO2 98%   BMI 34.38 kg/m    ASSESSMENT:  Diabetes type 2, uncontrolled with BMI 30-35    See history of present illness for detailed discussion of current diabetes management, blood sugar patterns and problems identified  She has been on a regimen of Trulicity, metformin and glipizide ER Since she was having GI side effects with Trulicity she has not been able to take it consistently and blood sugars are recently going higher especially fasting.  She was not benefiting from taking Invokana with high readings Her weight has gone up 10 pounds She has  not been consistent with diet and exercise either  Currently her insurance does not cover Tanzeum she can probably tried Tanzeum try Victoza    PLAN:    Have shown her how to use the Victoza injection pen  She will start with 5 clicks for the first 2 or 3 days giving her 0.3 mg and then go up to 0.6 ng.  Subsequently can try to increase one click daily until she has reached 1.2 mg or having GI side effects or  She can call if she has any difficulty with this.  Start checking blood  sugars after meals rather than in the morning  Regular exercise  Consultation with dietitian  Follow-up in 4 weeks  May consider prior authorization for Tanzeum if need be  There are no Patient Instructions on file for this visit.   Counseling time on subjects discussed above is over 50% of today's 25 minute visit   Zaeem Kandel 11/06/2015, 10:29 AM   Note: This office note was prepared with Estate agent. Any transcriptional errors that result from this process are unintentional.

## 2015-11-06 NOTE — Patient Instructions (Signed)
Check blood sugars on waking up 3  times a week Also check blood sugars about 2 hours after a meal and do this after different meals by rotation  Recommended blood sugar levels on waking up is 90-130 and about 2 hours after meal is 130-160  Please bring your blood sugar monitor to each visit, thank you  Metformin 4 daily  Start VICTOZA injection as shown once daily at the same time of the day.  Dial the dose to 0.6 mg on the pen for the first week.  You may inject in the stomach, thigh or arm. You may experience nausea in the first few days which usually goes away.  You will feel fullness of the stomach with starting the medication and should try to keep the portions at meals small. After 1 week increase the dose as we talked daily if no nausea present.   If any questions or concerns are present call the office or the [place] helpline at [phone]. Visit [email or web address]/ for more useful information

## 2015-11-24 ENCOUNTER — Telehealth: Payer: Self-pay | Admitting: Family Medicine

## 2015-11-24 DIAGNOSIS — E119 Type 2 diabetes mellitus without complications: Secondary | ICD-10-CM

## 2015-11-24 NOTE — Telephone Encounter (Signed)
Pt need a referral to a Endocrinologist [name] at [facility]

## 2015-11-24 NOTE — Telephone Encounter (Signed)
I left a voice message with below information.

## 2015-11-24 NOTE — Telephone Encounter (Signed)
The referral was done

## 2015-11-26 ENCOUNTER — Telehealth: Payer: Self-pay | Admitting: Endocrinology

## 2015-11-26 ENCOUNTER — Encounter: Payer: Self-pay | Admitting: Family Medicine

## 2015-11-26 MED ORDER — INSULIN PEN NEEDLE 31G X 8 MM MISC: 2 refills | Status: DC

## 2015-11-26 NOTE — Telephone Encounter (Signed)
PT needs the pen needles for Victoza called into the pharmacy. [facility] [place]

## 2015-11-26 NOTE — Telephone Encounter (Signed)
Rx submitted per pt's request.

## 2015-12-02 ENCOUNTER — Encounter: Payer: Self-pay | Admitting: Family Medicine

## 2015-12-03 ENCOUNTER — Other Ambulatory Visit: Payer: BLUE CROSS/BLUE SHIELD

## 2015-12-08 ENCOUNTER — Ambulatory Visit: Payer: BLUE CROSS/BLUE SHIELD | Admitting: Endocrinology

## 2015-12-09 NOTE — Telephone Encounter (Signed)
We have never treated this and these meds are not mentioned anywhere in her chart. She would need an OV to discuss this

## 2015-12-15 ENCOUNTER — Other Ambulatory Visit: Payer: Self-pay | Admitting: Endocrinology

## 2015-12-26 ENCOUNTER — Other Ambulatory Visit: Payer: BLUE CROSS/BLUE SHIELD

## 2015-12-30 ENCOUNTER — Other Ambulatory Visit (INDEPENDENT_AMBULATORY_CARE_PROVIDER_SITE_OTHER): Payer: BLUE CROSS/BLUE SHIELD

## 2015-12-30 DIAGNOSIS — E1165 Type 2 diabetes mellitus with hyperglycemia: Secondary | ICD-10-CM | POA: Diagnosis not present

## 2015-12-30 LAB — COMPREHENSIVE METABOLIC PANEL
ALBUMIN: 4.2 g/dL (ref 3.5–5.2)
ALK PHOS: 83 U/L (ref 39–117)
ALT: 86 U/L — ABNORMAL HIGH (ref 0–35)
AST: 87 U/L — ABNORMAL HIGH (ref 0–37)
BUN: 7 mg/dL (ref 6–23)
CALCIUM: 9.2 mg/dL (ref 8.4–10.5)
CHLORIDE: 100 meq/L (ref 96–112)
CO2: 24 mEq/L (ref 19–32)
Creatinine, Ser: 0.54 mg/dL (ref 0.40–1.20)
GFR: 136.63 mL/min (ref >60.00)
Glucose, Bld: 174 mg/dL — ABNORMAL HIGH (ref 70–99)
POTASSIUM: 3.5 meq/L (ref 3.5–5.1)
SODIUM: 136 meq/L (ref 135–145)
TOTAL PROTEIN: 7.4 g/dL (ref 6.0–8.3)
Total Bilirubin: 0.5 mg/dL (ref 0.2–1.2)

## 2015-12-30 LAB — GLUCOSE, RANDOM: Glucose, Bld: 174 mg/dL — ABNORMAL HIGH (ref 70–99)

## 2015-12-30 LAB — HEMOGLOBIN A1C: Hgb A1c MFr Bld: 7.5 % — ABNORMAL HIGH (ref 4.6–6.5)

## 2015-12-31 ENCOUNTER — Ambulatory Visit (INDEPENDENT_AMBULATORY_CARE_PROVIDER_SITE_OTHER): Payer: BLUE CROSS/BLUE SHIELD | Admitting: Endocrinology

## 2015-12-31 ENCOUNTER — Encounter: Payer: Self-pay | Admitting: Endocrinology

## 2015-12-31 VITALS — BP 119/73 | HR 77 | Ht 66.0 in | Wt 212.0 lb

## 2015-12-31 DIAGNOSIS — E1165 Type 2 diabetes mellitus with hyperglycemia: Secondary | ICD-10-CM | POA: Diagnosis not present

## 2015-12-31 MED ORDER — GABAPENTIN 100 MG PO CAPS: 100.0000 mg | ORAL_CAPSULE | Freq: Three times a day (TID) | ORAL | 3 refills | Status: DC

## 2015-12-31 MED ORDER — CANAGLIFLOZIN 300 MG PO TABS: 300.0000 mg | ORAL_TABLET | Freq: Every day | ORAL | 3 refills | Status: DC

## 2015-12-31 NOTE — Patient Instructions (Signed)
3 of Invokana in am  Check blood sugars on waking up  3x weekly  Also check blood sugars about 2 hours after a meal and do this after different meals by rotation  Recommended blood sugar levels on waking up is 90-130 and about 2 hours after meal is 130-160  Please bring your blood sugar monitor to each visit, thank you  Walking!

## 2015-12-31 NOTE — Progress Notes (Signed)
Patient ID: Laurie Sutton, female   DOB: 05-Sep-1980, 35 y.o.   MRN: [id number]           Reason for Appointment: Follow-up for Type 2 Diabetes  Referring physician: Sarajane Jews  History of Present Illness:          Date of diagnosis of type 2 diabetes mellitus: 11/2013        Background history:  She had gestational diabetes treated with oral medications during her second pregnancy She may have been having borderline glucose levels in 2014 when followed up by her gynecologist In 8/15 she was found to have glycosuria but her gynecologist but apparently had no symptoms of unusual fatigue, weight loss, thirst or increased urination. Her glucose was 332 and A1c 12% She has been on metformin since diagnosis.  Initially was taking 1000 mg twice a day and this did not cause any side effects in the first 6 months. She was also given glipizide and with this combination her blood sugars improved significantly with fasting blood sugars in the 90-110 range and A1c down to 6.5 Subsequently with metformin she had diarrhea and Januvia did not help her sugar  Recent history:       Non-insulin hypoglycemic drugs the patient is taking are:   glipizide ER 5 mg daily, metformin ER 2000 mg daily    On her initial consultation she was given a trial of metformin ER in gradually increasing doses  She also started on Trulicity to help with multiple issues with her weight gain and portion control, however because of GI side effects this was stopped With a one-month trial of Invokana 100 mg he did not think her blood sugars were better  A1c is higher than usual at 7.5, previously 6%  Current management, blood sugar patterns and problems identified:  She was told to try Victoza on her last visit instead of Trulicity since she had difficulty tolerating the latter.  However she could not get around to doing the injection because of needle phobia  Despite instructions he is checking blood sugars only in the mornings  and these are variable  She has difficulty controlling snacks in the evenings and he thinks blood sugars are higher when she is eating more snacks or eating late dinners in the evening  Has a glucose of 112 today the office in the afternoon but has not had any food for about 5 hours  She has not been able to lose any of the weight that she had earlier gained  Recently she has not been exercising, has been having various issues  Side effects from medications have been: Diarrhea from regular metformin  Compliance with the medical regimen: Inconsistent Hypoglycemia:   none  Glucose monitoring:  done 1 times a day         Glucometer:  Contour Blood Glucose readings by download show  Mean values apply above for all meters except median for One Touch  PRE-MEAL Fasting Lunch Dinner Bedtime Overall  Glucose range: 113-203       Mean/median: 158         Self-care: The diet that the patient has been following is: tries to limit bread and sugars, more vegetables, avoiding regular colas. She is eating out 1-2 times a week at fast food restaurants       Typical meal intake: Breakfast is variable, may have a toast, cereal bar or peanut butter crackers.   Lunch is a sandwich or leftovers.  Usually having baked meats  at home. For snacks will have cheese or peanut butter crackers, fruit and dry cereal         Dietician visit, most recent: Never               Exercise: none Recently  Weight history: Previous range 190-220  Wt Readings from Last 3 Encounters:  12/31/15 212 lb (96.2 kg)  11/06/15 213 lb (96.6 kg)  08/29/15 203 lb (92.1 kg)    Glycemic control:   Lab Results  Component Value Date   HGBA1C 7.5 (H) 12/30/2015   HGBA1C 6.0 08/21/2015   HGBA1C 5.6 05/16/2015   Lab Results  Component Value Date   MICROALBUR 2.0 (H) 05/12/2015   LDLCALC 97 05/12/2015   CREATININE 0.54 12/30/2015    Lab on 12/30/2015  Component Date Value Ref Range Status  . Glucose, Bld 12/30/2015  174* 70 - 99 mg/dL Final  . Hgb A1c MFr Bld 12/30/2015 7.5* 4.6 - 6.5 % Final  . Sodium 12/30/2015 136  135 - 145 mEq/L Final  . Potassium 12/30/2015 3.5  3.5 - 5.1 mEq/L Final  . Chloride 12/30/2015 100  96 - 112 mEq/L Final  . CO2 12/30/2015 24  19 - 32 mEq/L Final  . Glucose, Bld 12/30/2015 174* 70 - 99 mg/dL Final  . BUN 12/30/2015 7  6 - 23 mg/dL Final  . Creatinine, Ser 12/30/2015 0.54  0.40 - 1.20 mg/dL Final  . Total Bilirubin 12/30/2015 0.5  0.2 - 1.2 mg/dL Final  . Alkaline Phosphatase 12/30/2015 83  39 - 117 U/L Final  . AST 12/30/2015 87* 0 - 37 U/L Final  . ALT 12/30/2015 86* 0 - 35 U/L Final  . Total Protein 12/30/2015 7.4  6.0 - 8.3 g/dL Final  . Albumin 12/30/2015 4.2  3.5 - 5.2 g/dL Final  . Calcium 12/30/2015 9.2  8.4 - 10.5 mg/dL Final  . GFR 12/30/2015 136.63  >60.00 mL/min Final        Medication List       Accurate as of 12/31/15  4:24 PM. Always use your most recent med list.          BAYER CONTOUR NEXT MONITOR w/Device Kit Use to check blood sugar once a day dx code E11.9   BAYER MICROLET LANCETS lancets Use as instructed to check blood sugar once a day dx code E11.9   canagliflozin 100 MG Tabs tablet Commonly known as:  INVOKANA 1 tablet before breakfast   Dulaglutide 0.75 MG/0.5ML Sopn Commonly known as:  TRULICITY INJECT CONTENTS WEEKLY   escitalopram 10 MG tablet Commonly known as:  LEXAPRO Take 1 tablet (10 mg total) by mouth daily.   esomeprazole 20 MG capsule Commonly known as:  NEXIUM Take 1 capsule (20 mg total) by mouth daily at 12 noon.   glipiZIDE 5 MG 24 hr tablet Commonly known as:  GLUCOTROL XL TAKE 1 TABLET (5 MG TOTAL) BY MOUTH DAILY WITH BREAKFAST.   glucose blood test strip Commonly known as:  BAYER CONTOUR NEXT TEST Use as instructed to check blood sugar once a day dx code E11.9   Insulin Pen Needle 31G X 8 MM Misc Use to inject medication 1 time per day.   losartan 50 MG tablet Commonly known as:  COZAAR TAKE  1 TABLET EVERY DAY   meclizine 25 MG tablet Commonly known as:  ANTIVERT Take 1 tablet (25 mg total) by mouth every 4 (four) hours as needed for dizziness.   metFORMIN 500 MG 24 hr  tablet Commonly known as:  GLUCOPHAGE-XR TAKE 3 TABLETS (1,500 MG TOTAL) BY MOUTH DAILY WITH SUPPER.   methylPREDNISolone 4 MG Tbpk tablet Commonly known as:  MEDROL DOSEPAK As directed   propranolol 20 MG tablet Commonly known as:  INDERAL Take 1 tablet (20 mg total) by mouth 2 (two) times daily.   rizatriptan 10 MG tablet Commonly known as:  MAXALT Take 1 tablet (10 mg total) by mouth 3 (three) times daily as needed.   VICTOZA 18 MG/3ML Sopn Generic drug:  Liraglutide Inject 0.2 mLs (1.2 mg total) into the skin daily. Inject once daily at the same time   zolpidem 10 MG tablet Commonly known as:  AMBIEN TAKE 1 TABLET BY MOUTH AT BEDTIME AS NEEDED FOR SLEEP       Allergies:  Allergies  Allergen Reactions  . Amoxicillin Hives and Itching  . Januvia [Sitagliptin] Other (See Comments)    Body aches all day  . Penicillins Hives    Has patient had a PCN reaction causing immediate rash, facial/tongue/throat swelling, SOB or lightheadedness with hypotension: yes. Rash  Has patient had a PCN reaction causing severe rash involving mucus membranes or skin necrosis: Yes- rash and hives all over body  Has patient had a PCN reaction that required hospitalization No Has patient had a PCN reaction occurring within the last 10 years: Yes  If all of the above answers are "NO", then may proceed with Cephalosporin use.     Past Medical History:  Diagnosis Date  . Anxiety   . Depression   . Diabetes mellitus without complication ([place])   . GERD (gastroesophageal reflux disease)   . Gynecological examination    sees Dr. Josefa Half  . Migraines   . Murmur, cardiac     Past Surgical History:  Procedure Laterality Date  . CESAREAN SECTION      Family History  Problem Relation Age of Onset  .  Hyperlipidemia Mother   . Stroke Mother   . Diabetes Mother   . Migraines Mother   . Migraines Maternal Aunt   . Migraines Maternal Grandmother   . Cerebral aneurysm Cousin   . Diabetes Father   . Diabetes Maternal Grandfather   . Heart disease Neg Hx     Social History:  reports that she has never smoked. She has never used smokeless tobacco. She reports that she does not drink alcohol or use drugs.    Review of Systems    Lipid history: LDL is below 100 without any statin drugs    Lab Results  Component Value Date   CHOL 176 05/12/2015   HDL 41.10 05/12/2015   LDLCALC 97 05/12/2015   TRIG 189.0 (H) 05/12/2015   CHOLHDL 4 05/12/2015          She was told by her PCP that she needs to be on losartan for kidney protection, This was stopped when she went on Invokana and has not started back  History of hypokalemia:  Lab Results  Component Value Date   CREATININE 0.54 12/30/2015   BUN 7 12/30/2015   NA 136 12/30/2015   K 3.5 12/30/2015   CL 100 12/30/2015   CO2 24 12/30/2015   She is now taking iron for anemia related to menstrual blood loss  Physical Examination:  BP 119/73   Pulse 77   Ht 5' 6"  (1.676 m)   Wt 212 lb (96.2 kg)   BMI 34.22 kg/m    ASSESSMENT:  Diabetes type 2, uncontrolled with  BMI 30-35    See history of present illness for detailed discussion of current diabetes management, blood sugar patterns and problems identified  Her A1c is 7.5 but significantly higher than the 6% before  She has been on a regimen of 2000 mg of metformin and glipizide ER 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      Jarelis Ehlert 12/31/2015, 4:24 PM   Note: This office note was prepared with Dragon voice recognition system technology. Any transcriptional errors that result from this process are unintentional.

## 2016-01-04 NOTE — Progress Notes (Signed)
Please let patient know that the liver tests are high again, needs to discuss with PCP

## 2016-01-08 ENCOUNTER — Encounter: Payer: Self-pay | Admitting: *Deleted

## 2016-01-12 ENCOUNTER — Ambulatory Visit (INDEPENDENT_AMBULATORY_CARE_PROVIDER_SITE_OTHER): Payer: BLUE CROSS/BLUE SHIELD | Admitting: Family Medicine

## 2016-01-12 DIAGNOSIS — Z23 Encounter for immunization: Secondary | ICD-10-CM | POA: Diagnosis not present

## 2016-01-14 LAB — TB SKIN TEST
Induration: 0 mm
TB Skin Test: NEGATIVE

## 2016-02-17 ENCOUNTER — Encounter: Payer: Self-pay | Admitting: Family Medicine

## 2016-02-17 ENCOUNTER — Ambulatory Visit (INDEPENDENT_AMBULATORY_CARE_PROVIDER_SITE_OTHER): Payer: BLUE CROSS/BLUE SHIELD | Admitting: Family Medicine

## 2016-02-17 VITALS — BP 119/68 | HR 67 | Temp 98.0°F | Ht 66.0 in | Wt 212.0 lb

## 2016-02-17 DIAGNOSIS — J019 Acute sinusitis, unspecified: Secondary | ICD-10-CM | POA: Diagnosis not present

## 2016-02-17 MED ORDER — LEVOFLOXACIN 500 MG PO TABS: 500.0000 mg | ORAL_TABLET | Freq: Every day | ORAL | 0 refills | Status: AC

## 2016-02-17 NOTE — Progress Notes (Signed)
Pre visit review using our clinic review tool, if applicable. No additional management support is needed unless otherwise documented below in the visit note. 

## 2016-02-17 NOTE — Progress Notes (Signed)
   Subjective:    Patient ID: Laurie Sutton, female    DOB: [birth year], 35 y.o.   MRN: [id number]  HPI Here for one week of sinus pressure, pain in the right ear, PND, and coughing up yellow sputum. No fever. On Zyrtec and Sudafed.    Review of Systems  Constitutional: Negative.   HENT: Positive for congestion, ear pain, postnasal drip, sinus pain and sore throat.   Eyes: Negative.   Respiratory: Positive for cough.        Objective:   Physical Exam  Constitutional: She appears well-developed and well-nourished.  HENT:  Left Ear: External ear normal.  Nose: Nose normal.  Mouth/Throat: Oropharynx is clear and moist. No oropharyngeal exudate.  Right TM is pink, no effusion   Eyes: Conjunctivae are normal.  Pulmonary/Chest: Effort normal and breath sounds normal.  Lymphadenopathy:    She has no cervical adenopathy.          Assessment & Plan:  Sinusitis with an early otitis media, treat with Levaquin. Add Ibuprofen prn.  Laurey Morale, MD

## 2016-02-25 ENCOUNTER — Other Ambulatory Visit (INDEPENDENT_AMBULATORY_CARE_PROVIDER_SITE_OTHER): Payer: BLUE CROSS/BLUE SHIELD

## 2016-02-25 DIAGNOSIS — E1165 Type 2 diabetes mellitus with hyperglycemia: Secondary | ICD-10-CM | POA: Diagnosis not present

## 2016-02-25 LAB — BASIC METABOLIC PANEL
BUN: 10 mg/dL (ref 6–23)
CHLORIDE: 102 meq/L (ref 96–112)
CO2: 25 mEq/L (ref 19–32)
CREATININE: 0.55 mg/dL (ref 0.40–1.20)
Calcium: 10.2 mg/dL (ref 8.4–10.5)
GFR: 133.65 mL/min (ref >60.00)
Glucose, Bld: 114 mg/dL — ABNORMAL HIGH (ref 70–99)
Potassium: 4.3 mEq/L (ref 3.5–5.1)
Sodium: 138 mEq/L (ref 135–145)

## 2016-02-26 LAB — FRUCTOSAMINE: Fructosamine: 234 umol/L (ref 0–285)

## 2016-02-29 ENCOUNTER — Other Ambulatory Visit: Payer: Self-pay | Admitting: Family Medicine

## 2016-03-01 ENCOUNTER — Encounter: Payer: Self-pay | Admitting: Endocrinology

## 2016-03-01 ENCOUNTER — Ambulatory Visit (INDEPENDENT_AMBULATORY_CARE_PROVIDER_SITE_OTHER): Payer: BLUE CROSS/BLUE SHIELD | Admitting: Endocrinology

## 2016-03-01 VITALS — BP 120/72 | HR 69 | Wt 212.0 lb

## 2016-03-01 DIAGNOSIS — E1165 Type 2 diabetes mellitus with hyperglycemia: Secondary | ICD-10-CM

## 2016-03-01 NOTE — Patient Instructions (Signed)
  Check blood sugars on waking up  2-3x per week  Also check blood sugars about 2 hours after a meal and do this after different meals by rotation  Recommended blood sugar levels on waking up is 90-130 and about 2 hours after meal is 130-160  Please bring your blood sugar monitor to each visit, thank you  More exercise

## 2016-03-01 NOTE — Progress Notes (Signed)
Patient ID: Laurie Sutton, female   DOB: Feb 28, 1981, 35 y.o.   MRN: [id number]           Reason for Appointment: Follow-up for Type 2 Diabetes  Referring physician: Sarajane Jews  History of Present Illness:          Date of diagnosis of type 2 diabetes mellitus: 11/2013        Background history:  She had gestational diabetes treated with oral medications during her second pregnancy She may have been having borderline glucose levels in 2014 when followed up by her gynecologist In 8/15 she was found to have glycosuria but her gynecologist but apparently had no symptoms of unusual fatigue, weight loss, thirst or increased urination. Her glucose was 332 and A1c 12% She has been on metformin since diagnosis.  Initially was taking 1000 mg twice a day and this did not cause any side effects in the first 6 months. She was also given glipizide and with this combination her blood sugars improved significantly with fasting blood sugars in the 90-110 range and A1c down to 6.5 Subsequently with metformin she had diarrhea and Januvia did not help her sugar  Recent history:       Non-insulin hypoglycemic drugs the patient is taking are:   glipizide ER 5 mg daily, metformin ER 2000 mg daily, Invokana 300 mg daily    On her initial consultation she was given a trial of metformin ER in gradually increasing doses  She also started on Trulicity to help with multiple issues with her weight gain and portion control, however because of GI side effects this was stopped, subsequently could not take Victoza because of needle phobia Since her last visit that she has been taking Invokana  A1c is higher than usual in 9/17  at 7.5, previously 6%  Current management, blood sugar patterns and problems identified:  Her blood sugars improved significantly with Invokana and she had no side effects  However she stopped it a week or 2 ago because of information displayed on her television  Although she had been trying to  do fairly well on her diet she has not lost any weight even with the Invokana  Has been checking blood sugar very sporadically at home but they are relatively good with mild increase in the mornings, previously had been getting readings around 200 at times  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  No side effects with 2000 mg of metformin ER now, only rarely will need Imodium Side effects from medications have been: Diarrhea from regular metformin  Compliance with the medical regimen: Inconsistent Hypoglycemia:   none  Glucose monitoring:  done 1 times a day         Glucometer:  Contour Blood Glucose readings by download show  Mean values apply above for all meters except median for One Touch  PRE-MEAL Fasting Lunch Dinner Bedtime Overall  Glucose range: 127-158   94  144    Mean/median:         Self-care: The diet that the patient has been following is: tries to limit bread and sugars, more vegetables, avoiding regular colas. She is eating out 1-2 times a week at fast food restaurants       Typical meal intake: Breakfast is variable, may have a toast, cereal bar or peanut butter crackers.   Lunch is a sandwich or leftovers.  Usually having baked meats at home. For snacks will  have cheese or peanut butter crackers, fruit and dry cereal         Dietician visit, most recent: Never               Exercise: No formal activity  Weight history: Previous range 190-220  Wt Readings from Last 3 Encounters:  03/01/16 212 lb (96.2 kg)  02/17/16 212 lb (96.2 kg)  12/31/15 212 lb (96.2 kg)    Glycemic control:   Lab Results  Component Value Date   HGBA1C 7.5 (H) 12/30/2015   HGBA1C 6.0 08/21/2015   HGBA1C 5.6 05/16/2015   Lab Results  Component Value Date   MICROALBUR 2.0 (H) 05/12/2015   LDLCALC 97 05/12/2015   CREATININE 0.55 02/25/2016    Lab on 02/25/2016  Component Date Value Ref Range Status  . Sodium 02/25/2016 138   135 - 145 mEq/L Final  . Potassium 02/25/2016 4.3  3.5 - 5.1 mEq/L Final  . Chloride 02/25/2016 102  96 - 112 mEq/L Final  . CO2 02/25/2016 25  19 - 32 mEq/L Final  . Glucose, Bld 02/25/2016 114* 70 - 99 mg/dL Final  . BUN 02/25/2016 10  6 - 23 mg/dL Final  . Creatinine, Ser 02/25/2016 0.55  0.40 - 1.20 mg/dL Final  . Calcium 02/25/2016 10.2  8.4 - 10.5 mg/dL Final  . GFR 02/25/2016 133.65  >60.00 mL/min Final  . Fructosamine 02/26/2016 234  0 - 285 umol/L Final   Comment: Published reference interval for apparently healthy subjects between age 52 and 110 is 31 - 285 umol/L and in a poorly controlled diabetic population is 228 - 563 umol/L with a mean of 396 umol/L.         Medication List       Accurate as of 03/01/16  4:30 PM. Always use your most recent med list.          BAYER CONTOUR NEXT MONITOR w/Device Kit Use to check blood sugar once a day dx code E11.9   BAYER MICROLET LANCETS lancets Use as instructed to check blood sugar once a day dx code E11.9   canagliflozin 300 MG Tabs tablet Commonly known as:  INVOKANA Take 1 tablet (300 mg total) by mouth daily before breakfast.   escitalopram 10 MG tablet Commonly known as:  LEXAPRO TAKE 1 TABLET BY MOUTH EVERY DAY   esomeprazole 20 MG capsule Commonly known as:  NEXIUM Take 1 capsule (20 mg total) by mouth daily at 12 noon.   gabapentin 100 MG capsule Commonly known as:  NEURONTIN Take 1 capsule (100 mg total) by mouth 3 (three) times daily.   glipiZIDE 5 MG 24 hr tablet Commonly known as:  GLUCOTROL XL TAKE 1 TABLET (5 MG TOTAL) BY MOUTH DAILY WITH BREAKFAST.   glucose blood test strip Commonly known as:  BAYER CONTOUR NEXT TEST Use as instructed to check blood sugar once a day dx code E11.9   Insulin Pen Needle 31G X 8 MM Misc Use to inject medication 1 time per day.   meclizine 25 MG tablet Commonly known as:  ANTIVERT Take 1 tablet (25 mg total) by mouth every 4 (four) hours as needed for  dizziness.   metFORMIN 500 MG 24 hr tablet Commonly known as:  GLUCOPHAGE-XR TAKE 3 TABLETS (1,500 MG TOTAL) BY MOUTH DAILY WITH SUPPER.   propranolol 20 MG tablet Commonly known as:  INDERAL Take 1 tablet (20 mg total) by mouth 2 (two) times daily.   rizatriptan 10 MG tablet Commonly  known as:  MAXALT Take 1 tablet (10 mg total) by mouth 3 (three) times daily as needed.   zolpidem 10 MG tablet Commonly known as:  AMBIEN TAKE 1 TABLET BY MOUTH AT BEDTIME AS NEEDED FOR SLEEP       Allergies:  Allergies  Allergen Reactions  . Amoxicillin Hives and Itching  . Januvia [Sitagliptin] Other (See Comments)    Body aches all day  . Penicillins Hives    Has patient had a PCN reaction causing immediate rash, facial/tongue/throat swelling, SOB or lightheadedness with hypotension: yes. Rash  Has patient had a PCN reaction causing severe rash involving mucus membranes or skin necrosis: Yes- rash and hives all over body  Has patient had a PCN reaction that required hospitalization No Has patient had a PCN reaction occurring within the last 10 years: Yes  If all of the above answers are "NO", then may proceed with Cephalosporin use.     Past Medical History:  Diagnosis Date  . Anxiety   . Depression   . Diabetes mellitus without complication ([place])   . GERD (gastroesophageal reflux disease)   . Gynecological examination    sees Dr. Josefa Half  . Migraines   . Murmur, cardiac     Past Surgical History:  Procedure Laterality Date  . CESAREAN SECTION      Family History  Problem Relation Age of Onset  . Hyperlipidemia Mother   . Stroke Mother   . Diabetes Mother   . Migraines Mother   . Migraines Maternal Aunt   . Migraines Maternal Grandmother   . Cerebral aneurysm Cousin   . Diabetes Father   . Diabetes Maternal Grandfather   . Heart disease Neg Hx     Social History:  reports that she has never smoked. She has never used smokeless tobacco. She reports that she does  not drink alcohol or use drugs.    Review of Systems    Lipid history: LDL is below 100 without any statin drugs    Lab Results  Component Value Date   CHOL 176 05/12/2015   HDL 41.10 05/12/2015   LDLCALC 97 05/12/2015   TRIG 189.0 (H) 05/12/2015   CHOLHDL 4 05/12/2015          She was told by her PCP that she needs to be on losartan for kidney protection, This was stopped when she went on Invokana and has not started back  History of hypokalemia:  Lab Results  Component Value Date   CREATININE 0.55 02/25/2016   BUN 10 02/25/2016   NA 138 02/25/2016   K 4.3 02/25/2016   CL 102 02/25/2016   CO2 25 02/25/2016   She is now taking iron for anemia related to menstrual blood loss  Physical Examination:  BP 120/72   Pulse 69   Wt 212 lb (96.2 kg)   LMP 02/10/2016   SpO2 98%   BMI 34.22 kg/m    ASSESSMENT:  Diabetes type 2, uncontrolled with BMI 30-35    See history of present illness for detailed discussion of current diabetes management, blood sugar patterns and problems identified  Her A1c is 7.5 On the last visit  She has been on a regimen of 2000 mg of metformin , Invokana and glipizide ER Although she has gone off her Invokana recently she has not had any significant increase in blood sugars in the last few days and overall has benefited with better control Currently tolerating maximum dose metformin Fructosamine indicates overall fairly  good control However she has not checked blood sugars often and mostly in the mornings again   PLAN:    More consistent monitoring after meals  Start formal walking for exercise  Continue metformin 2000 mg but need to restart Invokana, reassured her about safety of this product specifically with her diagnosis, age and lack of complications  Recheck F1W in 3 months  Offered influenza vaccine but she refused  Patient Instructions   Check blood sugars on waking up  2-3x per week  Also check blood sugars about 2 hours  after a meal and do this after different meals by rotation  Recommended blood sugar levels on waking up is 90-130 and about 2 hours after meal is 130-160  Please bring your blood sugar monitor to each visit, thank you  More exercise        [facility] 03/01/2016, 4:30 PM   Note: This office note was prepared with Dragon voice recognition system technology. Any transcriptional errors that result from this process are unintentional.

## 2016-03-10 ENCOUNTER — Ambulatory Visit: Payer: BLUE CROSS/BLUE SHIELD | Admitting: Nurse Practitioner

## 2016-03-17 ENCOUNTER — Other Ambulatory Visit: Payer: Self-pay | Admitting: Endocrinology

## 2016-05-28 ENCOUNTER — Encounter: Payer: Self-pay | Admitting: Family Medicine

## 2016-05-28 ENCOUNTER — Ambulatory Visit (INDEPENDENT_AMBULATORY_CARE_PROVIDER_SITE_OTHER): Payer: BLUE CROSS/BLUE SHIELD | Admitting: Family Medicine

## 2016-05-28 VITALS — BP 121/75 | HR 72 | Temp 98.0°F | Ht 66.0 in | Wt 208.0 lb

## 2016-05-28 DIAGNOSIS — J019 Acute sinusitis, unspecified: Secondary | ICD-10-CM

## 2016-05-28 DIAGNOSIS — M5431 Sciatica, right side: Secondary | ICD-10-CM

## 2016-05-28 MED ORDER — AZITHROMYCIN 250 MG PO TABS: ORAL_TABLET | ORAL | 0 refills | Status: DC

## 2016-05-28 NOTE — Progress Notes (Signed)
Pre visit review using our clinic review tool, if applicable. No additional management support is needed unless otherwise documented below in the visit note. 

## 2016-05-28 NOTE — Progress Notes (Signed)
   Subjective:    Patient ID: LEXUS [name], female    DOB: 1980/11/18, 36 y.o.   MRN: [id number]  HPI Here for 2 issues. First for the past 3 days she has had fever to 100.2 degrees, headache, sinus pressure, PND, and a dry cough. Also for 6 months she has had intermittent pain in the right lower back that radiates down the right thigh. Sometimes the right leg goes numb. Using Ibuprofen and heat.    Review of Systems  Constitutional: Positive for fever.  HENT: Positive for congestion, postnasal drip, sinus pain, sinus pressure and sore throat.   Eyes: Negative.   Respiratory: Positive for cough.   Cardiovascular: Negative.   Musculoskeletal: Positive for back pain.       Objective:   Physical Exam  Constitutional: She is oriented to person, place, and time. She appears well-developed and well-nourished.  HENT:  Right Ear: External ear normal.  Left Ear: External ear normal.  Nose: Nose normal.  Mouth/Throat: Oropharynx is clear and moist.  Eyes: Conjunctivae are normal.  Neck: Neck supple. No thyromegaly present.  Pulmonary/Chest: Effort normal and breath sounds normal.  Musculoskeletal:  Tender over the right sciatic notch, full ROM   Lymphadenopathy:    She has no cervical adenopathy.  Neurological: She is alert and oriented to person, place, and time.          Assessment & Plan:  Treat the sinusitis with a Zpack. Written out of work today. Set up a lumbar spine MRI soon for the sciatic pain.  Alysia Penna, MD

## 2016-05-30 ENCOUNTER — Other Ambulatory Visit: Payer: Self-pay | Admitting: Endocrinology

## 2016-06-04 ENCOUNTER — Ambulatory Visit: Payer: BLUE CROSS/BLUE SHIELD | Admitting: Family Medicine

## 2016-06-06 ENCOUNTER — Other Ambulatory Visit: Payer: Self-pay | Admitting: Family Medicine

## 2016-06-06 ENCOUNTER — Other Ambulatory Visit: Payer: Self-pay | Admitting: Nurse Practitioner

## 2016-06-07 ENCOUNTER — Other Ambulatory Visit (INDEPENDENT_AMBULATORY_CARE_PROVIDER_SITE_OTHER): Payer: BLUE CROSS/BLUE SHIELD

## 2016-06-07 DIAGNOSIS — E1165 Type 2 diabetes mellitus with hyperglycemia: Secondary | ICD-10-CM | POA: Diagnosis not present

## 2016-06-07 LAB — COMPREHENSIVE METABOLIC PANEL
ALBUMIN: 4.3 g/dL (ref 3.5–5.2)
ALK PHOS: 78 U/L (ref 39–117)
ALT: 70 U/L — ABNORMAL HIGH (ref 0–35)
AST: 69 U/L — AB (ref 0–37)
BILIRUBIN TOTAL: 0.6 mg/dL (ref 0.2–1.2)
BUN: 8 mg/dL (ref 6–23)
CALCIUM: 9.6 mg/dL (ref 8.4–10.5)
CHLORIDE: 102 meq/L (ref 96–112)
CO2: 23 mEq/L (ref 19–32)
CREATININE: 0.56 mg/dL (ref 0.40–1.20)
GFR: 130.68 mL/min (ref >60.00)
Glucose, Bld: 122 mg/dL — ABNORMAL HIGH (ref 70–99)
Potassium: 3.7 mEq/L (ref 3.5–5.1)
Sodium: 137 mEq/L (ref 135–145)
TOTAL PROTEIN: 7.3 g/dL (ref 6.0–8.3)

## 2016-06-07 LAB — HEMOGLOBIN A1C: Hgb A1c MFr Bld: 6.9 % — ABNORMAL HIGH (ref 4.6–6.5)

## 2016-06-08 NOTE — Progress Notes (Signed)
Patient ID: Laurie Sutton, female   DOB: [birth year], 36 y.o.   MRN: [id number]           Reason for Appointment: Follow-up for Type 2 Diabetes  Referring physician: Sarajane Jews  History of Present Illness:          Date of diagnosis of type 2 diabetes mellitus: 11/2013        Background history:  She had gestational diabetes treated with oral medications during her second pregnancy She may have been having borderline glucose levels in 2014 when followed up by her gynecologist In 8/15 she was found to have glycosuria but her gynecologist but apparently had no symptoms of unusual fatigue, weight loss, thirst or increased urination. Her glucose was 332 and A1c 12% She has been on metformin since diagnosis.  Initially was taking 1000 mg twice a day and this did not cause any side effects in the first 6 months. She was also given glipizide and with this combination her blood sugars improved significantly with fasting blood sugars in the 90-110 range and A1c down to 6.5 Subsequently with metformin she had diarrhea and Januvia did not help her sugar  Recent history:       Non-insulin hypoglycemic drugs the patient is taking are:   glipizide ER 5 mg daily, metformin ER 2000 mg daily, Invokana 300 mg daily    On her initial consultation she was given a trial of metformin ER in gradually increasing doses  She also started on Trulicity to help with multiple issues with her weight gain and portion control, however because of GI side effects this was stopped, subsequently could not take Victoza because of needle phobia Since her last visit that she has been taking Invokana  A1c is improved at 6.9, previously 7.5  Current management, blood sugar patterns and problems identified:  Her blood sugars improved significantly with Invokana  However she did run out 3-4 weeks ago for a couple of weeks and blood sugars were significantly higher  With starting back on Invokana blood sugars are improving,  recently about 120+ in the morning  She is checking blood sugar very sporadically and has not been refilling her test strips consistently  Also has only one reading in the last couple of months after eating at night and she thinks that these are not consistently high  Weight has improved with restarting Invokana  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 but no regular formal exercise  No side effects with 2000 mg of metformin ER , only rarely will need Imodium Side effects from medications have been: Diarrhea from regular metformin  Compliance with the medical regimen: Inconsistent Hypoglycemia:   none  Glucose monitoring:  done 1 times a day         Glucometer:  Contour Blood Glucose readings by download show  Mean values apply above for all meters except median for One Touch  PRE-MEAL Fasting Lunch Dinner Bedtime Overall  Glucose range: 112-171     261    Mean/median:        POST-MEAL PC Breakfast PC Lunch PC Dinner  Glucose range:  149   Mean/median:       Self-care: The diet that the patient has been following is: tries to limit bread and sugars, more vegetables, avoiding regular colas. She is eating out 1-2 times a week at fast food restaurants       Typical meal intake: Breakfast is variable, may have a  toast, cereal bar or peanut butter crackers.   Lunch is a sandwich or leftovers.  Usually having baked meats at home. For snacks will have cheese or peanut butter crackers, fruit and dry cereal         Dietician visit, most recent: Never               Exercise: Walking for 15-20 min at times  Weight history: Previous range 190-220  Wt Readings from Last 3 Encounters:  06/09/16 208 lb (94.3 kg)  05/28/16 208 lb (94.3 kg)  03/01/16 212 lb (96.2 kg)    Glycemic control:   Lab Results  Component Value Date   HGBA1C 6.9 (H) 06/07/2016   HGBA1C 7.5 (H) 12/30/2015   HGBA1C 6.0 08/21/2015   Lab Results  Component Value Date   MICROALBUR  2.0 (H) 05/12/2015   LDLCALC 97 05/12/2015   CREATININE 0.56 06/07/2016    OTHER active problems: See review of systems   Lab on 06/07/2016  Component Date Value Ref Range Status  . Hgb A1c MFr Bld 06/07/2016 6.9* 4.6 - 6.5 % Final  . Sodium 06/07/2016 137  135 - 145 mEq/L Final  . Potassium 06/07/2016 3.7  3.5 - 5.1 mEq/L Final  . Chloride 06/07/2016 102  96 - 112 mEq/L Final  . CO2 06/07/2016 23  19 - 32 mEq/L Final  . Glucose, Bld 06/07/2016 122* 70 - 99 mg/dL Final  . BUN 06/07/2016 8  6 - 23 mg/dL Final  . Creatinine, Ser 06/07/2016 0.56  0.40 - 1.20 mg/dL Final  . Total Bilirubin 06/07/2016 0.6  0.2 - 1.2 mg/dL Final  . Alkaline Phosphatase 06/07/2016 78  39 - 117 U/L Final  . AST 06/07/2016 69* 0 - 37 U/L Final  . ALT 06/07/2016 70* 0 - 35 U/L Final  . Total Protein 06/07/2016 7.3  6.0 - 8.3 g/dL Final  . Albumin 06/07/2016 4.3  3.5 - 5.2 g/dL Final  . Calcium 06/07/2016 9.6  8.4 - 10.5 mg/dL Final  . GFR 06/07/2016 130.68  >60.00 mL/min Final      Allergies as of 06/09/2016      Reactions   Amoxicillin Hives, Itching   Januvia [sitagliptin] Other (See Comments)   Body aches all day   Penicillins Hives   Has patient had a PCN reaction causing immediate rash, facial/tongue/throat swelling, SOB or lightheadedness with hypotension: yes. Rash  Has patient had a PCN reaction causing severe rash involving mucus membranes or skin necrosis: Yes- rash and hives all over body  Has patient had a PCN reaction that required hospitalization No Has patient had a PCN reaction occurring within the last 10 years: Yes  If all of the above answers are "NO", then may proceed with Cephalosporin use.      Medication List       Accurate as of 06/09/16 12:40 PM. Always use your most recent med list.          azithromycin 250 MG tablet Commonly known as:  ZITHROMAX Z-PAK As directed   BAYER CONTOUR NEXT TEST test strip Generic drug:  glucose blood USE AS INSTRUCTED TO CHECK BLOOD  SUGAR ONCE A DAY DX CODE E11.9   BAYER MICROLET LANCETS lancets Use as instructed to check blood sugar once a day dx code E11.9   canagliflozin 300 MG Tabs tablet Commonly known as:  INVOKANA Take 1 tablet (300 mg total) by mouth daily before breakfast.   escitalopram 10 MG tablet Commonly known as:  LEXAPRO TAKE 1 TABLET BY MOUTH EVERY DAY   esomeprazole 20 MG capsule Commonly known as:  NEXIUM TAKE ONE CAPSULE BY MOUTH EVERY DAY AT NOON   gabapentin 100 MG capsule Commonly known as:  NEURONTIN Take 1 capsule (100 mg total) by mouth 3 (three) times daily.   glipiZIDE 5 MG 24 hr tablet Commonly known as:  GLUCOTROL XL TAKE 1 TABLET (5 MG TOTAL) BY MOUTH DAILY WITH BREAKFAST.   meclizine 25 MG tablet Commonly known as:  ANTIVERT Take 1 tablet (25 mg total) by mouth every 4 (four) hours as needed for dizziness.   metFORMIN 500 MG 24 hr tablet Commonly known as:  GLUCOPHAGE-XR TAKE 3 TABLETS (1,500 MG TOTAL) BY MOUTH DAILY WITH SUPPER.   propranolol 20 MG tablet Commonly known as:  INDERAL Take 1 tablet (20 mg total) by mouth 2 (two) times daily.   rizatriptan 10 MG tablet Commonly known as:  MAXALT Take 1 tablet (10 mg total) by mouth 3 (three) times daily as needed.   zolpidem 10 MG tablet Commonly known as:  AMBIEN TAKE 1 TABLET BY MOUTH AT BEDTIME AS NEEDED FOR SLEEP       Allergies:  Allergies  Allergen Reactions  . Amoxicillin Hives and Itching  . Januvia [Sitagliptin] Other (See Comments)    Body aches all day  . Penicillins Hives    Has patient had a PCN reaction causing immediate rash, facial/tongue/throat swelling, SOB or lightheadedness with hypotension: yes. Rash  Has patient had a PCN reaction causing severe rash involving mucus membranes or skin necrosis: Yes- rash and hives all over body  Has patient had a PCN reaction that required hospitalization No Has patient had a PCN reaction occurring within the last 10 years: Yes  If all of the above  answers are "NO", then may proceed with Cephalosporin use.     Past Medical History:  Diagnosis Date  . Anxiety   . Depression   . Diabetes mellitus without complication ([place])   . GERD (gastroesophageal reflux disease)   . Gynecological examination    sees Dr. Josefa Half  . Migraines   . Murmur, cardiac     Past Surgical History:  Procedure Laterality Date  . CESAREAN SECTION      Family History  Problem Relation Age of Onset  . Hyperlipidemia Mother   . Stroke Mother   . Diabetes Mother   . Migraines Mother   . Migraines Maternal Aunt   . Migraines Maternal Grandmother   . Cerebral aneurysm Cousin   . Diabetes Father   . Diabetes Maternal Grandfather   . Heart disease Neg Hx     Social History:  reports that she has never smoked. She has never used smokeless tobacco. She reports that she does not drink alcohol or use drugs.    Review of Systems    HEPATIC steatosis: This has been confirmed on her previous radiological studies Has persistently high liver functions, slightly better recently She is not aware of this currently and has not discussed with PCP  Lab Results  Component Value Date   ALT 70 (H) 06/07/2016     Lipid history: LDL is below 100 without any statin drugs    Lab Results  Component Value Date   CHOL 176 05/12/2015   HDL 41.10 05/12/2015   LDLCALC 97 05/12/2015   TRIG 189.0 (H) 05/12/2015   CHOLHDL 4 05/12/2015              Physical Examination:  BP 112/60   Pulse 70   Ht 5' 6"  (1.676 m)   Wt 208 lb (94.3 kg)   SpO2 97%   BMI 33.57 kg/m    ASSESSMENT:  Diabetes type 2, uncontrolled with BMI 30-35    See history of present illness for detailed discussion of current diabetes management, blood sugar patterns and problems identified  Her A1c is 6.9  She has been on a regimen of 2000 mg of metformin , Invokana and glipizide ER She has also lost some weight with starting back on Invokana However she did have high readings  when she had not refilled her prescription She is comfortable continuing to take this now and tolerating it well Also does need to check her blood sugars more consistently especially after meals which again she is not doing  PLAN:    Discussed timing and targets of blood sugars  Record her exercise  No change in medications at this time  Consultation with dietitian.  Emphasized the need for weight loss for her hepatic steatosis, discussed implications of this  There are no Patient Instructions on file for this visit.      Gordy Goar 06/09/2016, 12:40 PM   Note: This office note was prepared with Dragon voice recognition system technology. Any transcriptional errors that result from this process are unintentional.

## 2016-06-09 ENCOUNTER — Encounter: Payer: Self-pay | Admitting: Endocrinology

## 2016-06-09 ENCOUNTER — Ambulatory Visit (INDEPENDENT_AMBULATORY_CARE_PROVIDER_SITE_OTHER): Payer: BLUE CROSS/BLUE SHIELD | Admitting: Endocrinology

## 2016-06-09 VITALS — BP 112/60 | HR 70 | Ht 66.0 in | Wt 208.0 lb

## 2016-06-09 DIAGNOSIS — K760 Fatty (change of) liver, not elsewhere classified: Secondary | ICD-10-CM

## 2016-06-09 DIAGNOSIS — E1165 Type 2 diabetes mellitus with hyperglycemia: Secondary | ICD-10-CM

## 2016-06-12 ENCOUNTER — Ambulatory Visit
Admission: RE | Admit: 2016-06-12 | Discharge: 2016-06-12 | Disposition: A | Discharge status: Home / Self Care | Payer: BLUE CROSS/BLUE SHIELD | Source: Ambulatory Visit | Attending: Family Medicine | Admitting: Family Medicine

## 2016-06-12 DIAGNOSIS — M5431 Sciatica, right side: Secondary | ICD-10-CM

## 2016-06-17 ENCOUNTER — Ambulatory Visit (INDEPENDENT_AMBULATORY_CARE_PROVIDER_SITE_OTHER): Payer: BLUE CROSS/BLUE SHIELD | Admitting: Family Medicine

## 2016-06-17 ENCOUNTER — Encounter: Payer: Self-pay | Admitting: Family Medicine

## 2016-06-17 VITALS — BP 113/62 | HR 66 | Temp 98.0°F | Ht 66.0 in | Wt 209.0 lb

## 2016-06-17 DIAGNOSIS — L02429 Furuncle of limb, unspecified: Secondary | ICD-10-CM

## 2016-06-17 DIAGNOSIS — M545 Low back pain, unspecified: Secondary | ICD-10-CM

## 2016-06-17 DIAGNOSIS — L02439 Carbuncle of limb, unspecified: Secondary | ICD-10-CM

## 2016-06-17 MED ORDER — TERCONAZOLE 0.8 % VA CREA
1.0000 | TOPICAL_CREAM | Freq: Every day | VAGINAL | 5 refills | Status: DC
Start: 2016-06-17 — End: 2016-12-24

## 2016-06-17 MED ORDER — FLUCONAZOLE 150 MG PO TABS: 150.0000 mg | ORAL_TABLET | Freq: Once | ORAL | 11 refills | Status: AC

## 2016-06-17 MED ORDER — DOXYCYCLINE HYCLATE 100 MG PO CAPS
100.0000 mg | ORAL_CAPSULE | Freq: Two times a day (BID) | ORAL | 0 refills | Status: AC
Start: 2016-06-17 — End: 2016-06-27

## 2016-06-17 NOTE — Patient Instructions (Signed)
WE NOW OFFER   [facility] FAST TRACK!!!  SAME DAY Appointments for ACUTE CARE  Such as: Sprains, Injuries, cuts, abrasions, rashes, muscle pain, joint pain, back pain Colds, flu, sore throats, headache, allergies, cough, fever  Ear pain, sinus and eye infections Abdominal pain, nausea, vomiting, diarrhea, upset stomach Animal/insect bites  3 Easy Ways to Schedule: Walk-In Scheduling Call in scheduling Mychart Sign-up: [email or web address]         

## 2016-06-17 NOTE — Progress Notes (Signed)
   Subjective:    Patient ID: Laurie Sutton, female    DOB: Apr 01, 1981, 36 y.o.   MRN: [id number]  HPI Here for several issues. First 3 weeks ago she developed a painful lump in the right armpit. She saw her GYN and was given 10 days of Bactrim DS. This seemed to help and the lump got smaller. However now it is getting larger and more painful again. No fever. Also she has been struggling with stiffness and pain in the lower back. We recently obtained an MRI of the lumbar spine and this showed diffuse degenerative changes only. Using Ibuprofen and Gabapentin.    Review of Systems  Constitutional: Negative.   Musculoskeletal: Positive for back pain.  Skin: Positive for wound.       Objective:   Physical Exam  Constitutional: She appears well-developed and well-nourished.  Cardiovascular: Normal rate, regular rhythm, normal heart sounds and intact distal pulses.   Pulmonary/Chest: Effort normal and breath sounds normal.  Musculoskeletal: Normal range of motion. She exhibits no edema or tenderness.  Skin:  Small tender boil in the right axilla           Assessment & Plan:  Treat the boil with Doxycycline for 14 days. For the back pain, she will try a chiropractor.  Alysia Penna, MD

## 2016-06-17 NOTE — Progress Notes (Signed)
Pre visit review using our clinic review tool, if applicable. No additional management support is needed unless otherwise documented below in the visit note. 

## 2016-06-20 ENCOUNTER — Other Ambulatory Visit: Payer: Self-pay | Admitting: Endocrinology

## 2016-06-21 ENCOUNTER — Other Ambulatory Visit: Payer: Self-pay | Admitting: Endocrinology

## 2016-07-13 ENCOUNTER — Other Ambulatory Visit: Payer: Self-pay | Admitting: Endocrinology

## 2016-07-21 ENCOUNTER — Encounter: Payer: Self-pay | Admitting: Family Medicine

## 2016-07-21 ENCOUNTER — Ambulatory Visit (INDEPENDENT_AMBULATORY_CARE_PROVIDER_SITE_OTHER): Payer: BLUE CROSS/BLUE SHIELD | Admitting: Family Medicine

## 2016-07-21 VITALS — BP 140/80 | Temp 98.3°F | Ht 66.0 in | Wt 215.0 lb

## 2016-07-21 DIAGNOSIS — J018 Other acute sinusitis: Secondary | ICD-10-CM

## 2016-07-21 MED ORDER — LEVOFLOXACIN 500 MG PO TABS: 500.0000 mg | ORAL_TABLET | Freq: Every day | ORAL | 0 refills | Status: AC

## 2016-07-21 NOTE — Progress Notes (Signed)
Pre visit review using our clinic review tool, if applicable. No additional management support is needed unless otherwise documented below in the visit note. 

## 2016-07-21 NOTE — Progress Notes (Signed)
   Subjective:    Patient ID: Laurie Sutton, female    DOB: 1980/11/08, 36 y.o.   MRN: [id number]  HPI Here for 4 days of sinus pressure, blowing yellow mucus from the nose, and fever to 102 degrees. Some dry coughing. On Advil and Sudafed.    Review of Systems  Constitutional: Positive for fever.  HENT: Positive for congestion, postnasal drip, sinus pain and sinus pressure. Negative for sore throat.   Eyes: Negative.   Respiratory: Positive for cough.        Objective:   Physical Exam  Constitutional: She appears well-developed and well-nourished.  HENT:  Right Ear: External ear normal.  Left Ear: External ear normal.  Nose: Nose normal.  Mouth/Throat: Oropharynx is clear and moist.  Eyes: Conjunctivae are normal.  Neck: No thyromegaly present.  Pulmonary/Chest: Effort normal and breath sounds normal.  Lymphadenopathy:    She has no cervical adenopathy.          Assessment & Plan:  Sinusitis, treat with Levaquin. Written out of work today and tomorrow.  Alysia Penna, MD

## 2016-07-21 NOTE — Patient Instructions (Signed)
WE NOW OFFER   [facility] FAST TRACK!!!  SAME DAY Appointments for ACUTE CARE  Such as: Sprains, Injuries, cuts, abrasions, rashes, muscle pain, joint pain, back pain Colds, flu, sore throats, headache, allergies, cough, fever  Ear pain, sinus and eye infections Abdominal pain, nausea, vomiting, diarrhea, upset stomach Animal/insect bites  3 Easy Ways to Schedule: Walk-In Scheduling Call in scheduling Mychart Sign-up: [email or web address]         

## 2016-07-30 ENCOUNTER — Encounter: Payer: BLUE CROSS/BLUE SHIELD | Admitting: Dietician

## 2016-07-31 ENCOUNTER — Other Ambulatory Visit: Payer: Self-pay | Admitting: Endocrinology

## 2016-08-05 ENCOUNTER — Ambulatory Visit (INDEPENDENT_AMBULATORY_CARE_PROVIDER_SITE_OTHER): Payer: BLUE CROSS/BLUE SHIELD | Admitting: Family Medicine

## 2016-08-05 ENCOUNTER — Encounter: Payer: Self-pay | Admitting: Family Medicine

## 2016-08-05 VITALS — BP 112/80 | HR 93 | Temp 98.3°F | Ht 66.0 in | Wt 215.9 lb

## 2016-08-05 DIAGNOSIS — J302 Other seasonal allergic rhinitis: Secondary | ICD-10-CM | POA: Diagnosis not present

## 2016-08-05 DIAGNOSIS — J329 Chronic sinusitis, unspecified: Secondary | ICD-10-CM | POA: Diagnosis not present

## 2016-08-05 MED ORDER — DOXYCYCLINE HYCLATE 100 MG PO TABS: 100.0000 mg | ORAL_TABLET | Freq: Two times a day (BID) | ORAL | 0 refills | Status: AC

## 2016-08-05 MED ORDER — MONTELUKAST SODIUM 10 MG PO TABS: 10.0000 mg | ORAL_TABLET | Freq: Every day | ORAL | 0 refills | Status: DC

## 2016-08-05 NOTE — Patient Instructions (Signed)
BEFORE YOU LEAVE: -follow up: with your doctor in 2-4 weeks  Allegra daily.  flonase 2 sprays each nostril daily.  Add Singulair if needed.  Doxycycline (antibiotic) if persistent sinus pain, thick sinus congestion, etc.  I hope you are feeling better soon! Seek care sooner if worsening, new concerns or you are not improving with treatment.

## 2016-08-05 NOTE — Progress Notes (Signed)
Pre visit review using our clinic review tool, if applicable. No additional management support is needed unless otherwise documented below in the visit note. 

## 2016-08-05 NOTE — Progress Notes (Signed)
HPI:   Acute visit for allergies.  Reports for the last several years gets terrible allergy symptoms in the spring - watery irritated eyes, sneezing, nasal congestion, cough, PND, sinus pressure. Feels like this week has had swelling around eyes and facial pressure on the R>L with some R maxillary facial pain. Some drainage from R eye. Treated for sinus infection a few weeks ago and felt better for a few days. Zyrtec doesn't help. She tried Human resources officer which helped a little. No fevers, chills, thick white sinus drainage, body aches, vision changes, rash, SOB. She wants to see an allergist for allergy testing.  ROS: See pertinent positives and negatives per HPI.  Past Medical History:  Diagnosis Date  . Anxiety   . Depression   . Diabetes mellitus without complication ([place])   . GERD (gastroesophageal reflux disease)   . Gynecological examination    sees Dr. Josefa Half  . Migraines   . Murmur, cardiac     Past Surgical History:  Procedure Laterality Date  . CESAREAN SECTION      Family History  Problem Relation Age of Onset  . Hyperlipidemia Mother   . Stroke Mother   . Diabetes Mother   . Migraines Mother   . Migraines Maternal Aunt   . Migraines Maternal Grandmother   . Cerebral aneurysm Cousin   . Diabetes Father   . Diabetes Maternal Grandfather   . Heart disease Neg Hx     Social History   Social History  . Marital status: Married    Spouse name: N/A  . Number of children: 1  . Years of education: N/A   Social History Main Topics  . Smoking status: Never Smoker  . Smokeless tobacco: Never Used  . Alcohol use No  . Drug use: No  . Sexual activity: Yes    Birth control/ protection: None   Other Topics Concern  . None   Social History Narrative  . None     Current Outpatient Prescriptions:  .  BAYER CONTOUR NEXT TEST test strip, USE AS INSTRUCTED TO CHECK BLOOD SUGAR ONCE A DAY DX CODE E11.9, Disp: 50 each, Rfl: 3 .  BAYER MICROLET LANCETS lancets, Use  as instructed to check blood sugar once a day dx code E11.9, Disp: 100 each, Rfl: 1 .  escitalopram (LEXAPRO) 10 MG tablet, TAKE 1 TABLET BY MOUTH EVERY DAY, Disp: 90 tablet, Rfl: 3 .  esomeprazole (NEXIUM) 20 MG capsule, TAKE ONE CAPSULE BY MOUTH EVERY DAY AT NOON, Disp: 90 capsule, Rfl: 3 .  gabapentin (NEURONTIN) 100 MG capsule, Take 1 capsule (100 mg total) by mouth 3 (three) times daily., Disp: 90 capsule, Rfl: 3 .  glipiZIDE (GLUCOTROL XL) 5 MG 24 hr tablet, TAKE 1 TABLET (5 MG TOTAL) BY MOUTH DAILY WITH BREAKFAST., Disp: 90 tablet, Rfl: 0 .  INVOKANA 300 MG TABS tablet, TAKE 1 TABLET BY MOUTH EVERY DAY BEFORE BREAKFAST, Disp: 30 tablet, Rfl: 3 .  meclizine (ANTIVERT) 25 MG tablet, Take 1 tablet (25 mg total) by mouth every 4 (four) hours as needed for dizziness., Disp: 60 tablet, Rfl: 2 .  metFORMIN (GLUCOPHAGE-XR) 500 MG 24 hr tablet, TAKE 3 TABLETS (1,500 MG TOTAL) BY MOUTH DAILY WITH SUPPER., Disp: 270 tablet, Rfl: 0 .  propranolol (INDERAL) 20 MG tablet, Take 1 tablet (20 mg total) by mouth 2 (two) times daily., Disp: 180 tablet, Rfl: 3 .  rizatriptan (MAXALT) 10 MG tablet, Take 1 tablet (10 mg total) by mouth 3 (three) times  daily as needed., Disp: 10 tablet, Rfl: 5 .  terconazole (TERAZOL 3) 0.8 % vaginal cream, Place 1 applicator vaginally at bedtime., Disp: 20 g, Rfl: 5 .  zolpidem (AMBIEN) 10 MG tablet, TAKE 1 TABLET BY MOUTH AT BEDTIME AS NEEDED FOR SLEEP, Disp: 30 tablet, Rfl: 5 .  doxycycline (VIBRA-TABS) 100 MG tablet, Take 1 tablet (100 mg total) by mouth 2 (two) times daily., Disp: 14 tablet, Rfl: 0 .  montelukast (SINGULAIR) 10 MG tablet, Take 1 tablet (10 mg total) by mouth at bedtime., Disp: 30 tablet, Rfl: 0  EXAM:  Vitals:   08/05/16 1654  BP: 112/80  Pulse: 93  Temp: 98.3 F (36.8 C)    Body mass index is 34.85 kg/m.  GENERAL: vitals reviewed and listed above, alert, oriented, appears well hydrated and in no acute distress  HEENT: atraumatic, conjunttiva with  mild erythema and clear drainage bilat, mild puffiness below both eyes/lids (o/w no facial edema appreciated), no obvious abnormalities on inspection of external nose and ears, normal appearance of ear canals and TMs, clear nasal congestion, boggy turbinates, mild post oropharyngeal erythema with PND, no tonsillar edema or exudate, no sinus TTP  NECK: no obvious masses on inspection  LUNGS: clear to auscultation bilaterally, no wheezes, rales or rhonchi, good air movement  CV: HRRR, no peripheral edema  MS: moves all extremities without noticeable abnormality  PSYCH: pleasant and cooperative, no obvious depression or anxiety  ASSESSMENT AND PLAN:  Discussed the following assessment and plan:  Rhinosinusitis  Seasonal allergic rhinitis, unspecified trigger  -intensify allergy regimen - daily antihistamine, Flonase, consider adding Singulair -she is worried about another sinus infection, delayed abx rx provided in case sinus pain/thick sinus congestion develops - seems more like allergies today, advised to shred if does not need -follow up with PCP in 2-4 weeks or allergist if she decides to pursue allergy testing -Patient advised to return or notify a doctor immediately if symptoms worsen or persist or new concerns arise.  Patient Instructions  BEFORE YOU LEAVE: -follow up: with your doctor in 2-4 weeks  Allegra daily.  flonase 2 sprays each nostril daily.  Add Singulair if needed.  Doxycycline (antibiotic) if persistent sinus pain, thick sinus congestion, etc.  I hope you are feeling better soon! Seek care sooner if worsening, new concerns or you are not improving with treatment.       Colin Benton R., DO

## 2016-08-13 ENCOUNTER — Ambulatory Visit: Payer: BLUE CROSS/BLUE SHIELD | Admitting: Nurse Practitioner

## 2016-08-16 ENCOUNTER — Encounter: Payer: Self-pay | Admitting: Family Medicine

## 2016-08-16 ENCOUNTER — Telehealth: Payer: Self-pay | Admitting: Family Medicine

## 2016-08-16 NOTE — Telephone Encounter (Signed)
Patient Name: Laurie Sutton  DOB: 30-May-1980    Initial Comment Caller states c/o irregular heart rate.   Nurse Assessment  Nurse: Leilani Merl, RN, Heather Date/Time (Eastern Time): 08/16/2016 1:59:21 PM  Confirm and document reason for call. If symptomatic, describe symptoms. ---Caller states that her heart is racing and skipping beats for the last few years off and on, it started getting more frequent in the last month.  Does the patient have any new or worsening symptoms? ---Yes  Will a triage be completed? ---Yes  Related visit to physician within the last 2 weeks? ---No  Does the PT have any chronic conditions? (i.e. diabetes, asthma, etc.) ---Yes  List chronic conditions. ---See MR  Is the patient pregnant or possibly pregnant? (Ask all females between the ages of 46-55) ---No  Is this a behavioral health or substance abuse call? ---No     Guidelines    Guideline Title Affirmed Question Affirmed Notes  Heart Rate and Heartbeat Questions [1] Palpitations AND [2] no improvement after following Care Advice    Final Disposition User   See PCP When Office is Open (within 3 days) Stand[place], RN, [employer]    Referrals  REFERRED TO PCP OFFICE   Disagree/Comply: Comply

## 2016-08-16 NOTE — Telephone Encounter (Signed)
Spoke with pt and offered her appt with Dr. Sarajane Jews as he has openings tomorrow. She will ask if she can leave work early. She will call today and let us know. Appt held until 5pm.

## 2016-08-17 ENCOUNTER — Ambulatory Visit: Payer: Self-pay | Admitting: Adult Health

## 2016-08-18 ENCOUNTER — Other Ambulatory Visit: Payer: Self-pay | Admitting: Nurse Practitioner

## 2016-08-18 ENCOUNTER — Ambulatory Visit: Payer: Self-pay | Admitting: Adult Health

## 2016-08-18 ENCOUNTER — Encounter: Payer: Self-pay | Admitting: Family Medicine

## 2016-08-18 ENCOUNTER — Ambulatory Visit (INDEPENDENT_AMBULATORY_CARE_PROVIDER_SITE_OTHER): Payer: BLUE CROSS/BLUE SHIELD | Admitting: Family Medicine

## 2016-08-18 ENCOUNTER — Telehealth: Payer: Self-pay | Admitting: Nurse Practitioner

## 2016-08-18 ENCOUNTER — Ambulatory Visit: Payer: Self-pay | Admitting: Family Medicine

## 2016-08-18 VITALS — BP 156/94 | HR 88 | Temp 98.4°F | Ht 66.0 in | Wt 214.0 lb

## 2016-08-18 DIAGNOSIS — F418 Other specified anxiety disorders: Secondary | ICD-10-CM | POA: Diagnosis not present

## 2016-08-18 DIAGNOSIS — R000 Tachycardia, unspecified: Secondary | ICD-10-CM | POA: Diagnosis not present

## 2016-08-18 DIAGNOSIS — G43001 Migraine without aura, not intractable, with status migrainosus: Secondary | ICD-10-CM | POA: Diagnosis not present

## 2016-08-18 LAB — T4, FREE: Free T4: 0.75 ng/dL (ref 0.60–1.60)

## 2016-08-18 LAB — TSH: TSH: 1.01 u[IU]/mL (ref 0.35–4.50)

## 2016-08-18 LAB — T3, FREE: T3 FREE: 4.5 pg/mL — AB (ref 2.3–4.2)

## 2016-08-18 MED ORDER — PROPRANOLOL HCL 40 MG PO TABS: 40.0000 mg | ORAL_TABLET | Freq: Two times a day (BID) | ORAL | 0 refills | Status: DC

## 2016-08-18 MED ORDER — ESCITALOPRAM OXALATE 20 MG PO TABS: 20.0000 mg | ORAL_TABLET | Freq: Every day | ORAL | 1 refills | Status: DC

## 2016-08-18 MED ORDER — PROPRANOLOL HCL 40 MG PO TABS: 40.0000 mg | ORAL_TABLET | Freq: Two times a day (BID) | ORAL | 1 refills | Status: DC

## 2016-08-18 MED ORDER — PROPRANOLOL HCL 20 MG PO TABS
20.0000 mg | ORAL_TABLET | Freq: Two times a day (BID) | ORAL | 0 refills | Status: DC
Start: 2016-08-18 — End: 2016-08-18

## 2016-08-18 NOTE — Progress Notes (Signed)
   Subjective:    Patient ID: Laurie Sutton, female    DOB: December 31, 1980, 36 y.o.   MRN: [id number]  HPI Here for a number of complaints that she thinks all stem from anxiety. She has a stressful job and she says her boss is extremely difficult to work for. Cheyna thinks she is looking for a reason to fire her and the boss is always criticizing her work. Aalaysia has actually started looking for another job but has not found anything yet. She describes bouts of rapid heart beats or irregular heart beats, headaches, and nausea. She has been taking 10 mg of Lexapro for some time now and she had been happy with it until lately. She sleeps well. She has been taking Propranolol 20 mg bid from her neurologist to prevent migraines. Her diabetes has been well controlled and her last A1c in March was 6.9. She has changed from Dr. Dwyane Dee to [name] for this. She had a normal metabolic panel and CBC in March but her thyroid levels have not been checked in several years.  Review of Systems  Constitutional: Positive for fatigue.  Respiratory: Negative.   Cardiovascular: Positive for palpitations. Negative for chest pain and leg swelling.  Gastrointestinal: Positive for nausea. Negative for abdominal distention, abdominal pain, blood in stool, constipation, diarrhea and vomiting.  Endocrine: Negative.   Genitourinary: Negative.   Neurological: Positive for headaches.       Objective:   Physical Exam  Constitutional: She is oriented to person, place, and time. She appears well-developed and well-nourished.  Neck: No thyromegaly present.  Cardiovascular: Normal rate, regular rhythm, normal heart sounds and intact distal pulses.   EKG is normal   Pulmonary/Chest: Effort normal and breath sounds normal. No respiratory distress. She has no wheezes. She has no rales.  Lymphadenopathy:    She has no cervical adenopathy.  Neurological: She is alert and oriented to person, place, and time.  Psychiatric: Her  behavior is normal. Thought content normal.  Very anxious           Assessment & Plan:  She is having a lot of symptoms of anxiety and many of these result in palpitations and rapid heart beats. We will increase the Lexapro to 20 mg daily and increase the Propranolol to 40 mg bid. Check a thyroid panel today. Recheck in 2 weeks. Alysia Penna, MD

## 2016-08-18 NOTE — Telephone Encounter (Signed)
Pt called, she is out of propranolol and was hoping to get another prescription filled before her apt on 5/22.

## 2016-08-18 NOTE — Telephone Encounter (Signed)
Spoke to pt and she stated went to her pcp, he increased dose to 72m po bid.  I told her that CM/NP placed prescription for this at [facility]  457mpo bid.  She verbalized understanding.

## 2016-08-18 NOTE — Telephone Encounter (Signed)
Last filled 08-18-16,  60 tabs, should have enough for until her appt. 08-31-16 at taking 1 tab po bid.

## 2016-08-18 NOTE — Patient Instructions (Signed)
WE NOW OFFER    [facility] FAST TRACK!!!  SAME DAY Appointments for ACUTE CARE  Such as: Sprains, Injuries, cuts, abrasions, rashes, muscle pain, joint pain, back pain Colds, flu, sore throats, headache, allergies, cough, fever  Ear pain, sinus and eye infections Abdominal pain, nausea, vomiting, diarrhea, upset stomach Animal/insect bites  3 Easy Ways to Schedule: Walk-In Scheduling Call in scheduling Mychart Sign-up: [email or web address]         

## 2016-08-24 ENCOUNTER — Ambulatory Visit: Payer: BLUE CROSS/BLUE SHIELD | Admitting: Nurse Practitioner

## 2016-08-26 ENCOUNTER — Encounter: Payer: Self-pay | Admitting: Family Medicine

## 2016-08-26 ENCOUNTER — Other Ambulatory Visit: Payer: Self-pay | Admitting: Family Medicine

## 2016-08-26 MED ORDER — CLOTRIMAZOLE-BETAMETHASONE 1-0.05 % EX CREA
1.0000 | TOPICAL_CREAM | Freq: Two times a day (BID) | CUTANEOUS | 2 refills | Status: DC | PRN
Start: 2016-08-26 — End: 2021-06-01

## 2016-08-26 NOTE — Telephone Encounter (Signed)
Call in Clotrimazole-Betamethasone 1-0.05% cream to apply bid prn rash, 30 grams with 2 rf

## 2016-08-29 ENCOUNTER — Encounter: Payer: Self-pay | Admitting: Family Medicine

## 2016-08-30 ENCOUNTER — Other Ambulatory Visit: Payer: Self-pay

## 2016-08-30 MED ORDER — MONTELUKAST SODIUM 10 MG PO TABS: 10.0000 mg | ORAL_TABLET | Freq: Every day | ORAL | 3 refills | Status: DC

## 2016-08-30 NOTE — Telephone Encounter (Signed)
Call in Montelukast 10 mg daily, #90 with 3 rf

## 2016-08-31 ENCOUNTER — Ambulatory Visit: Payer: BLUE CROSS/BLUE SHIELD | Admitting: Nurse Practitioner

## 2016-09-01 ENCOUNTER — Other Ambulatory Visit: Payer: Self-pay | Admitting: Family Medicine

## 2016-09-08 DIAGNOSIS — E119 Type 2 diabetes mellitus without complications: Secondary | ICD-10-CM

## 2016-09-09 ENCOUNTER — Ambulatory Visit: Payer: BLUE CROSS/BLUE SHIELD | Admitting: Nurse Practitioner

## 2016-09-19 ENCOUNTER — Other Ambulatory Visit: Payer: Self-pay | Admitting: Endocrinology

## 2016-09-21 ENCOUNTER — Encounter: Payer: Self-pay | Admitting: Family Medicine

## 2016-09-24 ENCOUNTER — Telehealth: Payer: Self-pay | Admitting: Family Medicine

## 2016-09-24 NOTE — Telephone Encounter (Signed)
Patient picked up form for work that was dropped off on 09/08/16.

## 2016-09-27 ENCOUNTER — Encounter: Payer: Self-pay | Admitting: Family Medicine

## 2016-09-27 NOTE — Telephone Encounter (Signed)
Call in Doxycycline 100 mg bid for 14 days

## 2016-09-28 ENCOUNTER — Other Ambulatory Visit: Payer: Self-pay | Admitting: Family Medicine

## 2016-09-28 MED ORDER — DOXYCYCLINE HYCLATE 100 MG PO TABS: 100.0000 mg | ORAL_TABLET | Freq: Two times a day (BID) | ORAL | 0 refills | Status: DC

## 2016-10-04 ENCOUNTER — Other Ambulatory Visit: Payer: BLUE CROSS/BLUE SHIELD

## 2016-10-07 ENCOUNTER — Ambulatory Visit: Payer: BLUE CROSS/BLUE SHIELD | Admitting: Endocrinology

## 2016-10-15 ENCOUNTER — Other Ambulatory Visit: Payer: Self-pay | Admitting: Family Medicine

## 2016-10-18 NOTE — Telephone Encounter (Signed)
Call in #30 with 5 rf

## 2016-12-20 ENCOUNTER — Other Ambulatory Visit: Payer: Self-pay | Admitting: Endocrinology

## 2016-12-22 ENCOUNTER — Encounter: Payer: Self-pay | Admitting: Family Medicine

## 2016-12-22 NOTE — Telephone Encounter (Signed)
Come see [name] because we may need to lance this

## 2016-12-24 ENCOUNTER — Encounter: Payer: Self-pay | Admitting: Family Medicine

## 2016-12-24 ENCOUNTER — Ambulatory Visit: Payer: BLUE CROSS/BLUE SHIELD | Admitting: Family Medicine

## 2016-12-24 ENCOUNTER — Ambulatory Visit (INDEPENDENT_AMBULATORY_CARE_PROVIDER_SITE_OTHER): Payer: BLUE CROSS/BLUE SHIELD | Admitting: Family Medicine

## 2016-12-24 VITALS — BP 100/58 | Temp 97.9°F | Ht 66.0 in | Wt 210.0 lb

## 2016-12-24 DIAGNOSIS — L02429 Furuncle of limb, unspecified: Secondary | ICD-10-CM

## 2016-12-24 DIAGNOSIS — L02439 Carbuncle of limb, unspecified: Secondary | ICD-10-CM

## 2016-12-24 MED ORDER — SULFAMETHOXAZOLE-TRIMETHOPRIM 800-160 MG PO TABS: 1.0000 | ORAL_TABLET | Freq: Two times a day (BID) | ORAL | 2 refills | Status: DC

## 2016-12-24 MED ORDER — FLUCONAZOLE 150 MG PO TABS: 150.0000 mg | ORAL_TABLET | Freq: Once | ORAL | 5 refills | Status: AC

## 2016-12-24 MED ORDER — TERCONAZOLE 0.8 % VA CREA: 1.0000 | TOPICAL_CREAM | Freq: Every day | VAGINAL | 5 refills | Status: DC

## 2016-12-24 NOTE — Patient Instructions (Signed)
WE NOW OFFER   [facility] FAST TRACK!!!  SAME DAY Appointments for ACUTE CARE  Such as: Sprains, Injuries, cuts, abrasions, rashes, muscle pain, joint pain, back pain Colds, flu, sore throats, headache, allergies, cough, fever  Ear pain, sinus and eye infections Abdominal pain, nausea, vomiting, diarrhea, upset stomach Animal/insect bites  3 Easy Ways to Schedule: Walk-In Scheduling Call in scheduling Mychart Sign-up: [email or web address]         

## 2016-12-24 NOTE — Progress Notes (Signed)
   Subjective:    Patient ID: Laurie Sutton, female    DOB: 12-30-1980, 36 y.o.   MRN: [id number]  HPI Here for a painful boil in the right armpit that appeared 2 weeks ago. She has been taking a week of Doxycycline (which usually helps) but there has been no improvement. No fever.    Review of Systems  Constitutional: Negative.   Respiratory: Negative.   Cardiovascular: Negative.        Objective:   Physical Exam  Constitutional: She appears well-developed and well-nourished.  Cardiovascular: Normal rate, regular rhythm, normal heart sounds and intact distal pulses.   Pulmonary/Chest: Effort normal and breath sounds normal. No respiratory distress. She has no wheezes. She has no rales.  Skin:  There is a small tender boil in the right axilla. I do not think that lancing it would produce much fluid           Assessment & Plan:  Axillary boil. Switch from Doxycycline to Bactrim DS to take bid for 14 days. Recheck prn.  Alysia Penna, MD

## 2016-12-30 ENCOUNTER — Encounter: Payer: Self-pay | Admitting: Family Medicine

## 2017-01-05 ENCOUNTER — Encounter: Payer: Self-pay | Admitting: Family Medicine

## 2017-01-24 ENCOUNTER — Encounter: Payer: Self-pay | Admitting: Family Medicine

## 2017-01-24 ENCOUNTER — Ambulatory Visit (INDEPENDENT_AMBULATORY_CARE_PROVIDER_SITE_OTHER): Payer: BLUE CROSS/BLUE SHIELD | Admitting: Family Medicine

## 2017-01-24 VITALS — BP 98/56 | Temp 98.0°F | Ht 66.0 in | Wt 208.0 lb

## 2017-01-24 DIAGNOSIS — B029 Zoster without complications: Secondary | ICD-10-CM

## 2017-01-24 MED ORDER — METHYLPREDNISOLONE 4 MG PO TBPK: ORAL_TABLET | ORAL | 0 refills | Status: DC

## 2017-01-24 MED ORDER — SEMAGLUTIDE(0.25 OR 0.5MG/DOS) 2 MG/1.5ML ~~LOC~~ SOPN: 0.2500 mg | PEN_INJECTOR | SUBCUTANEOUS | 0 refills | Status: DC

## 2017-01-24 MED ORDER — VALACYCLOVIR HCL 1 G PO TABS: 1000.0000 mg | ORAL_TABLET | Freq: Three times a day (TID) | ORAL | 0 refills | Status: DC

## 2017-01-24 MED ORDER — EMPAGLIFLOZIN-METFORMIN HCL ER 5-1000 MG PO TB24: 2.0000 | ORAL_TABLET | ORAL | 0 refills | Status: DC

## 2017-01-24 NOTE — Progress Notes (Signed)
   Subjective:    Patient ID: Laurie Sutton, female    DOB: [birth year], 36 y.o.   MRN: [id number]  HPI Here for the sudden onset this morning of burning pains in the right side of the face, more in the forehead area but also in the cheek. Her face feels warm and tingly, and it is mildly swollen. No eye pain or blurred vision. She was started on 2 new medications several weeks ago by [name].    Review of Systems  Constitutional: Negative.   HENT: Positive for facial swelling. Negative for congestion, nosebleeds, sinus pain, sinus pressure and sore throat.   Eyes: Negative.   Respiratory: Negative.   Cardiovascular: Negative.   Skin: Negative for rash.       Objective:   Physical Exam  Constitutional: She is oriented to person, place, and time. She appears well-developed and well-nourished.  HENT:  Head: Normocephalic and atraumatic.  Right Ear: External ear normal.  Left Ear: External ear normal.  Nose: Nose normal.  Mouth/Throat: Oropharynx is clear and moist. No oropharyngeal exudate.  Eyes: Pupils are equal, round, and reactive to light. EOM are normal.  Neck: Neck supple. No thyromegaly present.  Pulmonary/Chest: Effort normal. No respiratory distress. She has no rales.  Lymphadenopathy:    She has no cervical adenopathy.  Neurological: She is alert and oriented to person, place, and time. No cranial nerve deficit. She exhibits normal muscle tone. Coordination normal.  Skin:  Skin of the face is clear, no rash          Assessment & Plan:  Possible shingles or trigeminal neuralgia. Given Valtrex and a Medrol dose pack. She will give [name] a report tomorrow. Written out of work today and tomorrow.  Alysia Penna, MD

## 2017-01-24 NOTE — Patient Instructions (Signed)
WE NOW OFFER   [facility] FAST TRACK!!!  SAME DAY Appointments for ACUTE CARE  Such as: Sprains, Injuries, cuts, abrasions, rashes, muscle pain, joint pain, back pain Colds, flu, sore throats, headache, allergies, cough, fever  Ear pain, sinus and eye infections Abdominal pain, nausea, vomiting, diarrhea, upset stomach Animal/insect bites  3 Easy Ways to Schedule: Walk-In Scheduling Call in scheduling Mychart Sign-up: [email or web address]         

## 2017-01-25 ENCOUNTER — Encounter: Payer: Self-pay | Admitting: Family Medicine

## 2017-01-25 NOTE — Telephone Encounter (Signed)
Noted  

## 2017-01-25 NOTE — Telephone Encounter (Signed)
I saw her notes and the photo. This is clearly not shingles so she can stop taking the Valtrex. Stay on the steroid pack though. This is looking more like an allergic reaction to something, most likely one of the new diabetes medications she recently started. Have her contact [name] and let him know what is going on. He may want to stop the meds.

## 2017-02-18 ENCOUNTER — Other Ambulatory Visit: Payer: Self-pay | Admitting: Family Medicine

## 2017-02-18 NOTE — Telephone Encounter (Signed)
Sent to PCP for approval. Last OV 01/24/2017.

## 2017-03-08 ENCOUNTER — Other Ambulatory Visit: Payer: Self-pay | Admitting: Family Medicine

## 2017-04-06 ENCOUNTER — Encounter: Payer: Self-pay | Admitting: Family Medicine

## 2017-04-06 ENCOUNTER — Ambulatory Visit (INDEPENDENT_AMBULATORY_CARE_PROVIDER_SITE_OTHER): Payer: BLUE CROSS/BLUE SHIELD | Admitting: Family Medicine

## 2017-04-06 VITALS — BP 110/84 | HR 90 | Temp 98.2°F | Wt 210.0 lb

## 2017-04-06 DIAGNOSIS — J018 Other acute sinusitis: Secondary | ICD-10-CM

## 2017-04-06 MED ORDER — LEVOFLOXACIN 500 MG PO TABS: 500.0000 mg | ORAL_TABLET | Freq: Every day | ORAL | 0 refills | Status: AC

## 2017-04-06 NOTE — Progress Notes (Signed)
   Subjective:    Patient ID: CARIME DINKEL, female    DOB: 1980-10-07, 36 y.o.   MRN: [id number]  HPI Here for 5 days of sinus pressure, headaches, earaches, PND, and coughing up yellow sputum. No fever. Using Mucinex and Sudafed PE.    Review of Systems  Constitutional: Negative.   HENT: Positive for congestion, ear pain, postnasal drip, sinus pressure, sinus pain and sore throat.   Eyes: Negative.   Respiratory: Positive for cough.        Objective:   Physical Exam  Constitutional: She appears well-developed and well-nourished.  HENT:  Right Ear: External ear normal.  Left Ear: External ear normal.  Nose: Nose normal.  Mouth/Throat: Oropharynx is clear and moist.  Eyes: Conjunctivae are normal.  Neck: No thyromegaly present.  Pulmonary/Chest: Effort normal and breath sounds normal. No respiratory distress. She has no wheezes. She has no rales.  Lymphadenopathy:    She has no cervical adenopathy.          Assessment & Plan:  Sinusitis, treat with Levaquin.  Alysia Penna, MD

## 2017-04-21 ENCOUNTER — Encounter: Payer: Self-pay | Admitting: Family Medicine

## 2017-04-21 NOTE — Telephone Encounter (Signed)
Call in Levaquin 500 mg daily for 10 days  

## 2017-04-25 ENCOUNTER — Telehealth: Payer: Self-pay | Admitting: *Deleted

## 2017-04-25 NOTE — Telephone Encounter (Signed)
Called pt and LVM on mobile advising pt she was on wait list and I was calling to offer appt tomorrow (04/26/17) at 12pm, check in 1130am.   Tried home number, spoke with son. Asked him to have her call our office back. Gave [facility] phone number.

## 2017-04-26 ENCOUNTER — Other Ambulatory Visit: Payer: Self-pay

## 2017-04-26 MED ORDER — LEVOFLOXACIN 500 MG PO TABS: 500.0000 mg | ORAL_TABLET | Freq: Every day | ORAL | 0 refills | Status: DC

## 2017-04-26 NOTE — Telephone Encounter (Signed)
Called pt back since no return call. Pt declined appt. States she has to work today.

## 2017-04-29 NOTE — Telephone Encounter (Signed)
Get the Levaquin filled that we just sent in, this may help the armpit

## 2017-05-02 ENCOUNTER — Encounter: Payer: Self-pay | Admitting: Family Medicine

## 2017-05-05 NOTE — Telephone Encounter (Signed)
Copied from [place] [id number]. Topic: General - Other >> May 05, 2017  8:45 AM [name] wrote: Esomeprazole Mag dr 20 mg    Pt is calling to speak with Wendie Simmer about her insurance would not coverer the Rx above for her and her husband Laurie Sutton 07[birth year]78. She says they have taken this medication for yrs.  She states insrucnce would cover the Rx only if they receive a statement that the pt's have taken over the counter.  She wants Dr. Sarajane Jews to send a note to her insurcance that they have been on this and needs to continue to taking the Rx.  Pleaes call pt after 3 pm today to let her know if this has been done.   [facility] - Phone: [phone]

## 2017-05-23 ENCOUNTER — Telehealth: Payer: Self-pay | Admitting: Family Medicine

## 2017-05-23 NOTE — Telephone Encounter (Signed)
Copied from [place]. Topic: Quick Communication - See Telephone Encounter >> May 23, 2017 11:45 AM Boyd Kerbs wrote: CRM for notification. See Telephone encounter for:   Pt is needing PA for Medication ( She has same [employer] ins. As her husband Laurie Sutton MRN# [id number])  esomeprazole ([place]) 20 MG capsule  [id number] [facility] [id number]: [phone]: [phone]    05/23/17.

## 2017-05-27 NOTE — Telephone Encounter (Signed)
Prior auth sent to [email or web address].

## 2017-06-16 ENCOUNTER — Encounter: Payer: Self-pay | Admitting: Family Medicine

## 2017-06-16 MED ORDER — ONDANSETRON HCL 8 MG PO TABS: 8.0000 mg | ORAL_TABLET | Freq: Four times a day (QID) | ORAL | 1 refills | Status: DC | PRN

## 2017-06-16 NOTE — Telephone Encounter (Signed)
Medication filled to pharmacy as requested.   

## 2017-06-16 NOTE — Telephone Encounter (Signed)
Call in Zofran 8 mg to take every 6 hours prn nausea, #30 with one rf

## 2017-06-29 ENCOUNTER — Encounter: Payer: Self-pay | Admitting: Neurology

## 2017-06-29 ENCOUNTER — Ambulatory Visit (INDEPENDENT_AMBULATORY_CARE_PROVIDER_SITE_OTHER): Payer: Commercial Managed Care - PPO | Admitting: Neurology

## 2017-06-29 VITALS — BP 117/67 | HR 65 | Ht 66.0 in | Wt 209.0 lb

## 2017-06-29 DIAGNOSIS — F418 Other specified anxiety disorders: Secondary | ICD-10-CM | POA: Diagnosis not present

## 2017-06-29 DIAGNOSIS — G43019 Migraine without aura, intractable, without status migrainosus: Secondary | ICD-10-CM | POA: Diagnosis not present

## 2017-06-29 MED ORDER — RIZATRIPTAN BENZOATE 10 MG PO TABS: 10.0000 mg | ORAL_TABLET | Freq: Three times a day (TID) | ORAL | 5 refills | Status: DC | PRN

## 2017-06-29 MED ORDER — NORTRIPTYLINE HCL 10 MG PO CAPS: ORAL_CAPSULE | ORAL | 3 refills | Status: DC

## 2017-06-29 NOTE — Progress Notes (Signed)
Reason for visit: Migraine headache  Laurie Sutton is an 38 y.o. female  History of present illness:  Laurie Sutton is a 37 year old right-handed white female with a history of diabetes and obesity.  The patient has migraine headaches, she was last seen in 2016.  At that time, she was doing quite well with her migraines, the headaches were occurring less than once a month.  Within the last 3 or 4 months, the patient indicates that her headaches have increased in frequency and are now occurring 3 or 4 times a week.  The patient remains on propranolol taking 40 mg twice daily.  She has tolerated the medication quite well.  The headaches are not severe, she has not had to use Maxalt in quite some time.  The patient will take Excedrin Migraine, she drinks 2-3 soft drinks a day as well.  The patient indicates that may be some increased stress that may have resulted in the heightened frequency of her headache.  The headaches are bifrontal and temporal in nature unassociated with any nausea or vomiting.  She is not missing work because of the headache.  She returns to this office for an evaluation.  She has some anxiety issues even while on propranolol, she takes Lexapro as well.  She does have gabapentin 100 mg capsules to take at night for a diabetic neuropathy, she rarely takes these capsules.  Past Medical History:  Diagnosis Date  . Anxiety   . Depression   . Diabetes mellitus without complication ([place])   . GERD (gastroesophageal reflux disease)   . Gynecological examination    sees Dr. Josefa Half  . Migraines   . Murmur, cardiac     Past Surgical History:  Procedure Laterality Date  . CESAREAN SECTION      Family History  Problem Relation Age of Onset  . Hyperlipidemia Mother   . Stroke Mother   . Diabetes Mother   . Migraines Mother   . Migraines Maternal Aunt   . Migraines Maternal Grandmother   . Cerebral aneurysm Cousin   . Diabetes Father   . Diabetes Maternal  Grandfather   . Heart disease Neg Hx     Social history:  reports that  has never smoked. she has never used smokeless tobacco. She reports that she does not drink alcohol or use drugs.    Allergies  Allergen Reactions  . Amoxicillin Hives and Itching  . Januvia [Sitagliptin] Other (See Comments)    Body aches all day  . Penicillins Hives    Has patient had a PCN reaction causing immediate rash, facial/tongue/throat swelling, SOB or lightheadedness with hypotension: yes. Rash  Has patient had a PCN reaction causing severe rash involving mucus membranes or skin necrosis: Yes- rash and hives all over body  Has patient had a PCN reaction that required hospitalization No Has patient had a PCN reaction occurring within the last 10 years: Yes  If all of the above answers are "NO", then may proceed with Cephalosporin use.     Medications:  Prior to Admission medications   Medication Sig Start Date End Date Taking? Authorizing Provider  BAYER CONTOUR NEXT TEST test strip USE AS INSTRUCTED TO CHECK BLOOD SUGAR ONCE A DAY DX CODE E11.9 05/31/16  Yes Elayne Snare, MD  BAYER MICROLET LANCETS lancets Use as instructed to check blood sugar once a day dx code E11.9 07/24/15  Yes Elayne Snare, MD  clotrimazole-betamethasone (LOTRISONE) cream Apply 1 application topically 2 (two) times  daily as needed. 08/26/16  Yes Laurey Morale, MD  Empagliflozin-Metformin HCl ER (SYNJARDY XR) 08-998 MG TB24 Take 2 tablets by mouth every morning. Patient taking differently: Take 3 tablets by mouth at bedtime.  01/24/17  Yes Laurey Morale, MD  escitalopram (LEXAPRO) 20 MG tablet TAKE 1 TABLET BY MOUTH EVERY DAY 03/08/17  Yes Laurey Morale, MD  esomeprazole (NEXIUM) 20 MG capsule TAKE ONE CAPSULE BY MOUTH EVERY DAY AT NOON 06/07/16  Yes Laurey Morale, MD  gabapentin (NEURONTIN) 100 MG capsule Take 1 capsule (100 mg total) by mouth 3 (three) times daily. Patient taking differently: Take 100 mg by mouth 3 (three) times  daily as needed.  12/31/15  Yes Elayne Snare, MD  meclizine (ANTIVERT) 25 MG tablet Take 1 tablet (25 mg total) by mouth every 4 (four) hours as needed for dizziness. 02/21/15  Yes Laurey Morale, MD  metFORMIN (GLUCOPHAGE) 500 MG tablet Take 3 tablets by mouth at bedtime.   Yes [provider]  montelukast (SINGULAIR) 10 MG tablet Take 1 tablet (10 mg total) by mouth at bedtime. 08/30/16  Yes Laurey Morale, MD  ondansetron (ZOFRAN) 8 MG tablet Take 1 tablet (8 mg total) by mouth every 6 (six) hours as needed for nausea. 06/16/17  Yes Laurey Morale, MD  propranolol (INDERAL) 40 MG tablet Take 1 tablet (40 mg total) by mouth 2 (two) times daily. 08/18/16  Yes Dennie Bible, NP  propranolol (INDERAL) 40 MG tablet TAKE 1 TABLET BY MOUTH TWICE A DAY 02/21/17  Yes Laurey Morale, MD  rizatriptan (MAXALT) 10 MG tablet Take 1 tablet (10 mg total) by mouth 3 (three) times daily as needed. 03/11/15  Yes Dennie Bible, NP  Semaglutide [facility]) 1 MG/DOSE SOPN Inject into the skin once a week.   Yes [provider]  terconazole (TERAZOL 3) 0.8 % vaginal cream Place 1 applicator vaginally at bedtime. 12/24/16  Yes Laurey Morale, MD  zolpidem (AMBIEN) 10 MG tablet TAKE 1 TABLET BY MOUTH AT BEDTIME AS NEEDED 10/19/16  Yes Laurey Morale, MD    ROS:  Out of a complete 14 system review of symptoms, the patient complains only of the following symptoms, and all other reviewed systems are negative.  Blurred vision Restless legs, insomnia Dizziness, headache Anxiety  Blood pressure 117/67, pulse 65, height 5' 6"  (1.676 m), weight 209 lb (94.8 kg).  Physical Exam  General: The patient is alert and cooperative at the time of the examination.  The patient is markedly obese.  Skin: No significant peripheral edema is noted.   Neurologic Exam  Mental status: The patient is alert and oriented x 3 at the time of the examination. The patient has apparent normal recent and remote  memory, with an apparently normal attention span and concentration ability.   Cranial nerves: Facial symmetry is present. Speech is normal, no aphasia or dysarthria is noted. Extraocular movements are full. Visual fields are full.  Motor: The patient has good strength in all 4 extremities.  Sensory examination: Soft touch sensation is symmetric on the face, arms, and legs.  Coordination: The patient has good finger-nose-finger and heel-to-shin bilaterally.  Gait and station: The patient has a normal gait. Tandem gait is normal. Romberg is negative. No drift is seen.  Reflexes: Deep tendon reflexes are symmetric.   Assessment/Plan:  1.  Migraine headache  2. Anxiety disorder  The patient will be placed on nortriptyline taking the 10 mg capsules, working  up to 30 mg at night.  If this is well-tolerated, we may be able to reduce the Lexapro dosing.  The combination of nortriptyline and propranolol is synergistic, the addition of nortriptyline may allow [name] to go up on the dose of propranolol in the future.  The propranolol should help with her anxiety disorder.  The patient will follow-up in 3 months.  A prescription was sent in for Maxalt.  Jill Alexanders MD 06/29/2017 7:38 AM  [facility] [address] [place], [place] [address]  Phone [phone] Fax [phone]

## 2017-06-29 NOTE — Patient Instructions (Signed)
   We will start nortriptyline for the headache, stay on the propranolol.  Pamelor (nortriptyline) is an antidepressant medication that has many uses that may include headache, whiplash injuries, or for peripheral neuropathy pain. Side effects may include drowsiness, dry mouth, blurred vision, or constipation. As with any antidepressant medication, worsening depression may occur. If you had any significant side effects, please call our office. The full effects of this medication may take 7-10 days after starting the drug, or going up on the dose.

## 2017-07-09 ENCOUNTER — Other Ambulatory Visit: Payer: Self-pay | Admitting: Family Medicine

## 2017-07-14 ENCOUNTER — Telehealth: Payer: Self-pay

## 2017-07-14 NOTE — Telephone Encounter (Signed)
Fax from [place] [address]   REF: ESOMEPRAZOLE   Alternative request step therapy required per insurance: omeprazole,lansoprazole, and pantoprazole   Sent to PCP to advise

## 2017-07-14 NOTE — Telephone Encounter (Signed)
Change from Nexium to Omeprazole 40 mg to take daily, #90 with 3 rf

## 2017-07-15 MED ORDER — OMEPRAZOLE 40 MG PO CPDR: 40.0000 mg | DELAYED_RELEASE_CAPSULE | Freq: Every day | ORAL | 3 refills | Status: DC

## 2017-07-15 NOTE — Telephone Encounter (Signed)
Called pt and left a VM that we have sent in Rx for omeprazole.

## 2017-07-25 ENCOUNTER — Encounter: Payer: Self-pay | Admitting: Family Medicine

## 2017-07-25 ENCOUNTER — Ambulatory Visit (INDEPENDENT_AMBULATORY_CARE_PROVIDER_SITE_OTHER): Payer: Commercial Managed Care - PPO | Admitting: Family Medicine

## 2017-07-25 VITALS — BP 124/78 | HR 71 | Temp 98.3°F | Resp 12 | Ht 66.0 in | Wt 206.2 lb

## 2017-07-25 DIAGNOSIS — J309 Allergic rhinitis, unspecified: Secondary | ICD-10-CM

## 2017-07-25 DIAGNOSIS — H6123 Impacted cerumen, bilateral: Secondary | ICD-10-CM | POA: Diagnosis not present

## 2017-07-25 MED ORDER — FLUTICASONE PROPIONATE 50 MCG/ACT NA SUSP: 1.0000 | Freq: Two times a day (BID) | NASAL | 2 refills | Status: DC

## 2017-07-25 NOTE — Patient Instructions (Addendum)
  Laurie Sutton I have seen you today for an acute visit.  A few things to remember from today's visit:   Bilateral hearing loss due to cerumen impaction  Allergic rhinitis, unspecified seasonality, unspecified trigger - Plan: fluticasone (FLONASE) 50 MCG/ACT nasal spray    There are 2 forms of allergic rhinitis: . Seasonal (hay fever): Caused by an allergy to pollen and/or mold spores in the air. Pollen is the fine powder that comes from the stamen of flowering plants. It can be carried through the air and is easily inhaled. Symptoms are seasonal and usually occur in spring, late summer, and fall. Marland Kitchen Perennial: Caused by other allergens such as dust mites, pet hair or dander, or mold. Symptoms occur year-round.  Symptoms: Your symptoms can vary, depending on the severity of your allergies. Symptoms can include: Sneezing, coughing.itching (mostly eyes, nose, mouth, throat and skin),runny nose,stuffy nose.headache,pressure in the nose and cheeks,ear fullness and popping, sore throat.watery, red, or swollen eyes,dark circles under your eyes,trouble smelling, and sometimes hives.  Allergic rhinitis cannot be prevented. You can help your symptoms by avoiding the things that you are allergic, including: . Keeping windows closed. This is especially important during high-pollen seasons. . Washing your hands after petting animals. . Using dust- and mite-proof bedding and mattress covers. . Wearing glasses outside to protect your eyes. . Showering before bed to wash off allergens from hair and skin. You can also avoid things that can make your symptoms worse, such as: . aerosol sprays . air pollution . cold temperatures . humidity . irritating fumes . tobacco smoke . wind . wood smoke.   Antihistamines help reduce the sneezing, runny nose, and itchiness of allergies. These come in pill form and as nasal sprays. Allegra,Zyrtec,or Claritin are some examples. Decongestants, such as  pseudoephedrine and phenylephrine, help temporarily relieve the stuffy nose of allergies. Decongestants are found in many medicines and come as pills, nose sprays, and nose drops. They could increase heart rate and cause tachycardia and tremor. Nasal Afrin should not be used for more than 3 days because you can become dependent on them. This causes you to feel even more stopped-up when you try to quit using them.  Nasal sprays: steroids or antihistaminics. Over the counter intranasal sterids: Nasocort,Rhinocort,or Flonase.You won't notice their benefits for up to 2 weeks after starting them. Allergy shots or sublingual tablets when other treatment do not help.This is done by immunologists.      In general please monitor for signs of worsening symptoms and seek immediate medical attention if any concerning.  If symptoms are not resolved in 2-3 weeks you should schedule a follow up appointment with your doctor, before if needed.  I hope you get better soon!

## 2017-07-25 NOTE — Progress Notes (Signed)
ACUTE VISIT  HPI:  Chief Complaint  Patient presents with  . Cough    sx started Saturday  . sneezing  . Headache  . Otalgia    both ears, feel stopped up    [name] is a 37 y.o.female here today complaining of 2-3 days of respiratory symptoms.  She is also complaining of ears "stop up", constant, no ear ache or ear drainage. She uses Q-tips to clean her ears frequently. No alleviating factors identified.  URI   This is a new problem. The current episode started in the past 7 days. The problem has been unchanged. There has been no fever. Associated symptoms include coughing, headaches, a plugged ear sensation, rhinorrhea and sneezing. Pertinent negatives include no abdominal pain, congestion, ear pain, nausea, rash, sinus pain, sore throat, swollen glands, vomiting or wheezing. She has tried antihistamine for the symptoms. The treatment provided mild relief.    Non-productive cough. Frontal pressure headache. No associated nausea, vomiting, visual changes, or focal deficit.  No Hx of recent travel. No sick contact.   Hx of allergies: Yes, history of allergic rhinitis.  Currently she is on Singulair 10 mg daily and Allegra 180 mg daily.  OTC medications for this problem: Debrox yesterday.  Symptoms otherwise stable.  Symptoms are otherwise stable.  Review of Systems  Constitutional: Negative for activity change, appetite change and fatigue.  HENT: Positive for rhinorrhea and sneezing. Negative for congestion, ear pain, facial swelling, mouth sores, sinus pain, sore throat, trouble swallowing and voice change.   Eyes: Negative for discharge, redness and itching.  Respiratory: Positive for cough. Negative for wheezing.   Gastrointestinal: Negative for abdominal pain, nausea and vomiting.  Musculoskeletal: Negative for gait problem and myalgias.  Skin: Negative for rash.  Allergic/Immunologic: Positive for environmental allergies.  Neurological:  Positive for headaches. Negative for syncope and facial asymmetry.  Hematological: Negative for adenopathy. Does not bruise/bleed easily.    Current Outpatient Medications on File Prior to Visit  Medication Sig Dispense Refill  . BAYER CONTOUR NEXT TEST test strip USE AS INSTRUCTED TO CHECK BLOOD SUGAR ONCE A DAY DX CODE E11.9 50 each 3  . BAYER MICROLET LANCETS lancets Use as instructed to check blood sugar once a day dx code E11.9 100 each 1  . clotrimazole-betamethasone (LOTRISONE) cream Apply 1 application topically 2 (two) times daily as needed. 30 g 2  . Empagliflozin-Metformin HCl ER (SYNJARDY XR) 08-998 MG TB24 Take 2 tablets by mouth every morning. (Patient taking differently: Take 3 tablets by mouth at bedtime. ) 30 tablet 0  . escitalopram (LEXAPRO) 20 MG tablet TAKE 1 TABLET BY MOUTH EVERY DAY 90 tablet 1  . gabapentin (NEURONTIN) 100 MG capsule Take 1 capsule (100 mg total) by mouth 3 (three) times daily. (Patient taking differently: Take 100 mg by mouth 3 (three) times daily as needed. ) 90 capsule 3  . meclizine (ANTIVERT) 25 MG tablet Take 1 tablet (25 mg total) by mouth every 4 (four) hours as needed for dizziness. 60 tablet 2  . metFORMIN (GLUCOPHAGE) 500 MG tablet Take 3 tablets by mouth at bedtime.    . montelukast (SINGULAIR) 10 MG tablet Take 1 tablet (10 mg total) by mouth at bedtime. 90 tablet 3  . nortriptyline (PAMELOR) 10 MG capsule Take one capsule at night for one week, then take 2 capsules at night for one week, then take 3 capsules at night 90 capsule 3  . ondansetron (ZOFRAN) 8 MG  tablet Take 1 tablet (8 mg total) by mouth every 6 (six) hours as needed for nausea. 30 tablet 1  . propranolol (INDERAL) 40 MG tablet Take 1 tablet (40 mg total) by mouth 2 (two) times daily. 60 tablet 0  . rizatriptan (MAXALT) 10 MG tablet Take 1 tablet (10 mg total) by mouth 3 (three) times daily as needed. 10 tablet 5  . Semaglutide (OZEMPIC) 1 MG/DOSE SOPN Inject into the skin once a  week.    . terconazole (TERAZOL 3) 0.8 % vaginal cream Place 1 applicator vaginally at bedtime. 20 g 5  . zolpidem (AMBIEN) 10 MG tablet TAKE 1 TABLET BY MOUTH AT BEDTIME AS NEEDED 30 tablet 5  . omeprazole (PRILOSEC) 40 MG capsule Take 1 capsule (40 mg total) by mouth daily. (Patient not taking: Reported on 07/25/2017) 90 capsule 3   No current facility-administered medications on file prior to visit.      Past Medical History:  Diagnosis Date  . Anxiety   . Depression   . Diabetes mellitus without complication ([place])   . GERD (gastroesophageal reflux disease)   . Gynecological examination    sees Dr. Josefa Sutton  . Migraines   . Murmur, cardiac    Allergies  Allergen Reactions  . Amoxicillin Hives and Itching  . Azithromycin   . Latex   . Penicillins Hives    Has patient had a PCN reaction causing immediate rash, facial/tongue/throat swelling, SOB or lightheadedness with hypotension: yes. Rash  Has patient had a PCN reaction causing severe rash involving mucus membranes or skin necrosis: Yes- rash and hives all over body  Has patient had a PCN reaction that required hospitalization No Has patient had a PCN reaction occurring within the last 10 years: Yes  If all of the above answers are "NO", then may proceed with Cephalosporin use.   . Sitagliptin Other (See Comments)    Body aches all day Other reaction(s): Arthralgia (Joint Pain)    Social History   Socioeconomic History  . Marital status: Married    Spouse name: Not on file  . Number of children: 1  . Years of education: Not on file  . Highest education level: Not on file  Occupational History  . Not on file  Social Needs  . Financial resource strain: Not on file  . Food insecurity:    Worry: Not on file    Inability: Not on file  . Transportation needs:    Medical: Not on file    Non-medical: Not on file  Tobacco Use  . Smoking status: Never Smoker  . Smokeless tobacco: Never Used  Substance and Sexual  Activity  . Alcohol use: No    Alcohol/week: 0.0 oz  . Drug use: No  . Sexual activity: Yes    Birth control/protection: None  Lifestyle  . Physical activity:    Days per week: Not on file    Minutes per session: Not on file  . Stress: Not on file  Relationships  . Social connections:    Talks on phone: Not on file    Gets together: Not on file    Attends religious service: Not on file    Active member of club or organization: Not on file    Attends meetings of clubs or organizations: Not on file    Relationship status: Not on file  Other Topics Concern  . Not on file  Social History Narrative  . Not on file    Vitals:  07/25/17 1619  BP: 124/78  Pulse: 71  Resp: 12  Temp: 98.3 F (36.8 C)  SpO2: 98%   Body mass index is 33.29 kg/m.   Physical Exam  Nursing note and vitals reviewed. Constitutional: She is oriented to person, place, and time. She appears well-developed. She does not appear ill. No distress.  HENT:  Head: Normocephalic and atraumatic.  Right Ear: Tympanic membrane, external ear and ear canal normal.  Left Ear: Tympanic membrane, external ear and ear canal normal.  Nose: Rhinorrhea present. Right sinus exhibits no maxillary sinus tenderness and no frontal sinus tenderness. Left sinus exhibits no maxillary sinus tenderness and no frontal sinus tenderness.  Mouth/Throat: Oropharynx is clear and moist and mucous membranes are normal.  Hypertrophic turbinates. Cerumen excess, not able to see TM, bilateral.  Eyes: Conjunctivae are normal.  Cardiovascular: Normal rate and regular rhythm.  No murmur heard. Respiratory: Effort normal and breath sounds normal. No respiratory distress.  Lymphadenopathy:       Head (right side): No submandibular adenopathy present.       Head (left side): No submandibular adenopathy present.    She has no cervical adenopathy.  Neurological: She is alert and oriented to person, place, and time. She has normal strength. Gait  normal.  Skin: Skin is warm. No rash noted. No erythema.  Psychiatric: She has a normal mood and affect. Her speech is normal.  Well groomed, good eye contact.    ASSESSMENT AND PLAN:   Ms. Hayleigh was seen today for cough, sneezing, headache and otalgia.  Diagnoses and all orders for this visit:  Bilateral hearing loss due to cerumen impaction  Symptoms resolved after ear lavage, which was well tolerated except for mild dizziness. TM with no erythema, bilateral. Recommend avoiding Q-tip use. Follow-up as needed.  Allergic rhinitis, unspecified seasonality, unspecified trigger  Respiratory symptoms she is reporting today seem to be associated with allergies. I recommend continuing with OTC Allegra 180 mg daily and Singulair 10 mg daily. Nasal saline irrigations as needed. Flonase nasal spray added today. Follow-up with PCP as needed.  -     fluticasone (FLONASE) 50 MCG/ACT nasal spray; Place 1 spray into both nostrils 2 (two) times daily.        Betty G. Martinique, MD  [facility]. [place] office.

## 2017-08-11 DIAGNOSIS — N390 Urinary tract infection, site not specified: Secondary | ICD-10-CM | POA: Diagnosis not present

## 2017-08-19 DIAGNOSIS — E1165 Type 2 diabetes mellitus with hyperglycemia: Secondary | ICD-10-CM | POA: Diagnosis not present

## 2017-08-19 DIAGNOSIS — R5383 Other fatigue: Secondary | ICD-10-CM | POA: Diagnosis not present

## 2017-08-19 DIAGNOSIS — K760 Fatty (change of) liver, not elsewhere classified: Secondary | ICD-10-CM | POA: Diagnosis not present

## 2017-08-19 LAB — CBC AND DIFFERENTIAL
HEMATOCRIT: 41 (ref 36–46)
HEMOGLOBIN: 14.3 (ref 12.0–16.0)
PLATELETS: 273 (ref 150–399)
WBC: 8.4

## 2017-08-19 LAB — HEMOGLOBIN A1C: HEMOGLOBIN A1C: 6.2

## 2017-08-19 LAB — LIPID PANEL
CHOLESTEROL: 169 (ref 0–200)
HDL: 38 (ref 35–70)
LDL Cholesterol: 91
TRIGLYCERIDES: 202 — AB (ref 40–160)

## 2017-08-19 LAB — TSH: TSH: 0.97 (ref 0.41–5.90)

## 2017-08-19 LAB — MICROALBUMIN, URINE: MICROALB UR: 2.6

## 2017-08-19 LAB — BASIC METABOLIC PANEL
BUN: 8 (ref 4–21)
Creatinine: 0.5 (ref <=1.1)
Glucose: 98
POTASSIUM: 4.2 (ref 3.4–5.3)
Sodium: 138 (ref 137–147)

## 2017-08-28 ENCOUNTER — Encounter: Payer: Self-pay | Admitting: Family Medicine

## 2017-09-02 ENCOUNTER — Other Ambulatory Visit: Payer: Self-pay | Admitting: Family Medicine

## 2017-10-04 NOTE — Progress Notes (Signed)
[facility]  PATIENT: Laurie Sutton DOB: 1981-04-03   REASON FOR VISIT: Follow-up for migraine  HISTORY FROM: Patient    HISTORY OF PRESENT ILLNESS:UPDATE 6/26/2019CM Laurie Sutton, 37 year old female returns for follow-up with history of migraine headaches.  She was placed on nortriptyline by Laurie Sutton and is currently taking 30 mg at night.  She has had one migraine in the last 3 months.  She takes Maxalt acutely.  She is not aware of any migraine triggers such as foods.  She works for the school system out for the summer.  She returns for reevaluation  06/29/17 Laurie Sutton. Laurie Sutton is a 37 year old right-handed white female with a history of diabetes and obesity.  The patient has migraine headaches, she was last seen in 2016.  At that time, she was doing quite well with her migraines, the headaches were occurring less than once a month.  Within the last 3 or 4 months, the patient indicates that her headaches have increased in frequency and are now occurring 3 or 4 times a week.  The patient remains on propranolol taking 40 mg twice daily.  She has tolerated the medication quite well.  The headaches are not severe, she has not had to use Maxalt in quite some time.  The patient will take Excedrin Migraine, she drinks 2-3 soft drinks a day as well.  The patient indicates that may be some increased stress that may have resulted in the heightened frequency of her headache.  The headaches are bifrontal and temporal in nature unassociated with any nausea or vomiting.  She is not missing work because of the headache.  She returns to this office for an evaluation.  She has some anxiety issues even while on propranolol, she takes Lexapro as well.  She does have gabapentin 100 mg capsules to take at night for a diabetic neuropathy, she rarely takes these capsules. REVIEW OF SYSTEMS: Full 14 system review of systems performed and notable only for those listed, all others are neg:    Constitutional: neg  Cardiovascular: neg Ear/Nose/Throat: neg  Skin: neg Eyes: neg Respiratory: neg Gastroitestinal: neg  Hematology/Lymphatic: neg  Endocrine: neg Musculoskeletal:neg Allergy/Immunology: neg Neurological: History of migraine Psychiatric: neg Sleep : neg   ALLERGIES: Allergies  Allergen Reactions  . Amoxicillin Hives and Itching  . Azithromycin   . Latex   . Penicillins Hives    Has patient had a PCN reaction causing immediate rash, facial/tongue/throat swelling, SOB or lightheadedness with hypotension: yes. Rash  Has patient had a PCN reaction causing severe rash involving mucus membranes or skin necrosis: Yes- rash and hives all over body  Has patient had a PCN reaction that required hospitalization No Has patient had a PCN reaction occurring within the last 10 years: Yes  If all of the above answers are "NO", then may proceed with Cephalosporin use.   . Sitagliptin Other (See Comments)    Body aches all day Other reaction(s): Arthralgia (Joint Pain)    HOME MEDICATIONS: Outpatient Medications Prior to Visit  Medication Sig Dispense Refill  . BAYER CONTOUR NEXT TEST test strip USE AS INSTRUCTED TO CHECK BLOOD SUGAR ONCE A DAY DX CODE E11.9 50 each 3  . BAYER MICROLET LANCETS lancets Use as instructed to check blood sugar once a day dx code E11.9 100 each 1  . clotrimazole-betamethasone (LOTRISONE) cream Apply 1 application topically 2 (two) times daily as needed. 30 g 2  . Empagliflozin-Metformin HCl ER (SYNJARDY XR) 08-998 MG TB24 Take 2  tablets by mouth every morning. (Patient taking differently: Take 3 tablets by mouth at bedtime. ) 30 tablet 0  . escitalopram (LEXAPRO) 20 MG tablet TAKE 1 TABLET BY MOUTH EVERY DAY 90 tablet 1  . fluticasone (FLONASE) 50 MCG/ACT nasal spray Place 1 spray into both nostrils 2 (two) times daily. 16 g 2  . gabapentin (NEURONTIN) 100 MG capsule Take 1 capsule (100 mg total) by mouth 3 (three) times daily. (Patient taking  differently: Take 100 mg by mouth 3 (three) times daily as needed. ) 90 capsule 3  . meclizine (ANTIVERT) 25 MG tablet Take 1 tablet (25 mg total) by mouth every 4 (four) hours as needed for dizziness. Sutton tablet 2  . metFORMIN (GLUCOPHAGE) 500 MG tablet Take 3 tablets by mouth at bedtime.    . montelukast (SINGULAIR) 10 MG tablet TAKE 1 TABLET BY MOUTH EVERYDAY AT BEDTIME 90 tablet 3  . nortriptyline (PAMELOR) 10 MG capsule Take one capsule at night for one week, then take 2 capsules at night for one week, then take 3 capsules at night (Patient taking differently: Take 30 mg by mouth at bedtime. Take one capsule at night for one week, then take 2 capsules at night for one week, then take 3 capsules at night) 90 capsule 3  . omeprazole (PRILOSEC) 40 MG capsule Take 1 capsule (40 mg total) by mouth daily. 90 capsule 3  . ondansetron (ZOFRAN) 8 MG tablet Take 1 tablet (8 mg total) by mouth every 6 (six) hours as needed for nausea. 30 tablet 1  . propranolol (INDERAL) 40 MG tablet Take 1 tablet (40 mg total) by mouth 2 (two) times daily. Sutton tablet 0  . rizatriptan (MAXALT) 10 MG tablet Take 1 tablet (10 mg total) by mouth 3 (three) times daily as needed. 10 tablet 5  . Semaglutide (OZEMPIC) 1 MG/DOSE SOPN Inject into the skin once a week.    . terconazole (TERAZOL 3) 0.8 % vaginal cream Place 1 applicator vaginally at bedtime. 20 g 5  . zolpidem (AMBIEN) 10 MG tablet TAKE 1 TABLET BY MOUTH AT BEDTIME AS NEEDED 30 tablet 5   No facility-administered medications prior to visit.     PAST MEDICAL HISTORY: Past Medical History:  Diagnosis Date  . Anxiety   . Depression   . Diabetes mellitus without complication ([place])   . GERD (gastroesophageal reflux disease)   . Gynecological examination    sees Dr. Josefa Sutton  . Migraines   . Murmur, cardiac     PAST SURGICAL HISTORY: Past Surgical History:  Procedure Laterality Date  . CESAREAN SECTION      FAMILY HISTORY: Family History  Problem  Relation Age of Onset  . Hyperlipidemia Mother   . Stroke Mother   . Diabetes Mother   . Migraines Mother   . Diabetes Father   . Migraines Maternal Aunt   . Migraines Maternal Grandmother   . Cerebral aneurysm Cousin   . Diabetes Maternal Grandfather   . Heart disease Neg Hx     SOCIAL HISTORY: Social History   Socioeconomic History  . Marital status: Married    Spouse name: Not on file  . Number of children: 1  . Years of education: Not on file  . Highest education level: Not on file  Occupational History  . Not on file  Social Needs  . Financial resource strain: Not on file  . Food insecurity:    Worry: Not on file    Inability: Not on  file  . Transportation needs:    Medical: Not on file    Non-medical: Not on file  Tobacco Use  . Smoking status: Never Smoker  . Smokeless tobacco: Never Used  Substance and Sexual Activity  . Alcohol use: No    Alcohol/week: 0.0 oz  . Drug use: No  . Sexual activity: Yes    Birth control/protection: None  Lifestyle  . Physical activity:    Days per week: Not on file    Minutes per session: Not on file  . Stress: Not on file  Relationships  . Social connections:    Talks on phone: Not on file    Gets together: Not on file    Attends religious service: Not on file    Active member of club or organization: Not on file    Attends meetings of clubs or organizations: Not on file    Relationship status: Not on file  . Intimate partner violence:    Fear of current or ex partner: Not on file    Emotionally abused: Not on file    Physically abused: Not on file    Forced sexual activity: Not on file  Other Topics Concern  . Not on file  Social History Narrative  . Not on file     PHYSICAL EXAM  Vitals:   10/05/17 1247  BP: 121/77  Pulse: 83  Weight: 208 lb (94.3 kg)  Height: 5' 6"  (1.676 m)   Body mass index is 33.57 kg/m.  Generalized: Well developed, in no acute distress  Head: normocephalic and atraumatic,.  Oropharynx benign  Neck: Supple,  Musculoskeletal: No deformity   Neurological examination   Mentation: Alert oriented to time, place, history taking. Attention span and concentration appropriate. Recent and remote memory intact.  Follows all commands speech and language fluent.   Cranial nerve II-XII: .Pupils were equal round reactive to light extraocular movements were full, visual field were full on confrontational test. Facial sensation and strength were normal. hearing was intact to finger rubbing bilaterally. Uvula tongue midline. head turning and shoulder shrug were normal and symmetric.Tongue protrusion into cheek strength was normal. Motor: normal bulk and tone, full strength in the BUE, BLE, fine finger movements normal, no pronator drift. No focal weakness Sensory: normal and symmetric to light touch,  Coordination: finger-nose-finger, heel-to-shin bilaterally, no dysmetria Reflexes: Symmetric upper and lower plantar responses were flexor bilaterally. Gait and Station: Rising up from seated position without assistance, normal stance,  moderate stride, good arm swing, smooth turning, able to perform tiptoe, and heel walking without difficulty. Tandem gait is steady  DIAGNOSTIC DATA (LABS, IMAGING, TESTING) - I reviewed patient records, labs, notes, testing and imaging myself where available.  Lab Results  Component Value Date   WBC 8.4 08/19/2017   HGB 14.3 08/19/2017   HCT 41 08/19/2017   MCV 74.4 (L) 12/21/2014   PLT 273 08/19/2017      Component Value Date/Time   NA 138 08/19/2017   K 4.2 08/19/2017   CL 102 06/07/2016 0802   CO2 23 06/07/2016 0802   GLUCOSE 122 (H) 06/07/2016 0802   BUN 8 08/19/2017   CREATININE 0.5 08/19/2017   CREATININE 0.56 06/07/2016 0802   CALCIUM 9.6 06/07/2016 0802   PROT 7.3 06/07/2016 0802   ALBUMIN 4.3 06/07/2016 0802   AST 69 (H) 06/07/2016 0802   ALT 70 (H) 06/07/2016 0802   ALKPHOS 78 06/07/2016 0802   BILITOT 0.6 06/07/2016 0802     GFRNONAA >Sutton  12/21/2014 1116   GFRAA >Sutton 12/21/2014 1116   Lab Results  Component Value Date   CHOL 169 08/19/2017   HDL 38 08/19/2017   LDLCALC 91 08/19/2017   TRIG 202 (A) 08/19/2017   CHOLHDL 4 05/12/2015   Lab Results  Component Value Date   HGBA1C 6.2 08/19/2017    Lab Results  Component Value Date   TSH 0.97 08/19/2017     ASSESSMENT AND PLAN  37 y.o. year old female  has a past medical history of Anxiety, Depression, Diabetes mellitus without complication ([place]), GERD (gastroesophageal reflux disease), and migraine headaches doing well on nortriptyline 30 mg at night and propanolol 40 mg twice daily.  She has had one migraine in the last 3 months    Continue nortriptyline 30 mg at night will refill Continue Maxalt acutely Reviewed foods that are migraine triggers Migraine tracker app F/U 6 months Laurie Bible, [facility], [facility], APRN  [facility] [address] [place], [place] [address] [phone]

## 2017-10-05 ENCOUNTER — Encounter: Payer: Self-pay | Admitting: Nurse Practitioner

## 2017-10-05 ENCOUNTER — Ambulatory Visit (INDEPENDENT_AMBULATORY_CARE_PROVIDER_SITE_OTHER): Payer: Commercial Managed Care - PPO | Admitting: Nurse Practitioner

## 2017-10-05 ENCOUNTER — Other Ambulatory Visit: Payer: Self-pay | Admitting: Family Medicine

## 2017-10-05 VITALS — BP 121/77 | HR 83 | Ht 66.0 in | Wt 208.0 lb

## 2017-10-05 DIAGNOSIS — G43001 Migraine without aura, not intractable, with status migrainosus: Secondary | ICD-10-CM | POA: Diagnosis not present

## 2017-10-05 MED ORDER — NORTRIPTYLINE HCL 10 MG PO CAPS: 30.0000 mg | ORAL_CAPSULE | Freq: Every day | ORAL | 6 refills | Status: DC

## 2017-10-05 NOTE — Patient Instructions (Signed)
Continue nortriptyline 30 mg at night Continue Maxalt acutely Reviewed foods that are migraine triggers Migraine tracker app F/U 6 months

## 2017-10-05 NOTE — Progress Notes (Signed)
I have read the note, and I agree with the clinical assessment and plan.  Ralf Konopka K Faustina Gebert   

## 2017-10-18 DIAGNOSIS — Z3202 Encounter for pregnancy test, result negative: Secondary | ICD-10-CM | POA: Diagnosis not present

## 2017-10-18 DIAGNOSIS — D509 Iron deficiency anemia, unspecified: Secondary | ICD-10-CM | POA: Diagnosis not present

## 2017-10-18 DIAGNOSIS — R82998 Other abnormal findings in urine: Secondary | ICD-10-CM | POA: Diagnosis not present

## 2017-10-18 DIAGNOSIS — N920 Excessive and frequent menstruation with regular cycle: Secondary | ICD-10-CM | POA: Diagnosis not present

## 2017-10-20 ENCOUNTER — Encounter: Payer: Self-pay | Admitting: Family Medicine

## 2017-10-20 DIAGNOSIS — E119 Type 2 diabetes mellitus without complications: Secondary | ICD-10-CM | POA: Diagnosis not present

## 2017-10-20 DIAGNOSIS — H52223 Regular astigmatism, bilateral: Secondary | ICD-10-CM | POA: Diagnosis not present

## 2017-10-20 LAB — HM DIABETES EYE EXAM

## 2017-11-02 ENCOUNTER — Encounter: Payer: Self-pay | Admitting: Family Medicine

## 2017-11-04 ENCOUNTER — Ambulatory Visit: Payer: Self-pay | Admitting: *Deleted

## 2017-11-04 NOTE — Telephone Encounter (Signed)
Pt called with having some rectal bleeding for the past 3 weeks. She sees it sometimes on her toilet paper but also in the commode. Sometimes she has constipation and other times diarrhea from the medications that she is taking.  She denies dizziness or weakness. She is not on a blood thinner.  Appointment scheduled per protocol and pt request for next Wednesday.  Pt will go to the ED if increase in bleeding,feeling dizzy or lightheaded.   Will route to flow at [facility] [place].    nReason for Disposition . MILD rectal bleeding (more than just a few drops or streaks)  Answer Assessment - Initial Assessment Questions 1. APPEARANCE of BLOOD: "What color is it?" "Is it passed separately, on the surface of the stool, or mixed in with the stool?"      Bright red mixed with stool 2. AMOUNT: "How much blood was passed?"      Sometime she can see it in the commode water and on tissue 3. FREQUENCY: "How many times has blood been passed with the stools?"      Not every time 4. ONSET: "When was the blood first seen in the stools?" (Days or weeks)      3 weeks  5. DIARRHEA: "Is there also some diarrhea?" If so, ask: "How many diarrhea stools were passed in past 24 hours?"      diarrhea 6. CONSTIPATION: "Do you have constipation?" If so, "How bad is it?"     sometimes 7. RECURRENT SYMPTOMS: "Have you had blood in your stools before?" If so, ask: "When was the last time?" and "What happened that time?"      Yes only once or twice 8. BLOOD THINNERS: "Do you take any blood thinners?" (e.g., Coumadin/warfarin, Pradaxa/dabigatran, aspirin)     no 9. OTHER SYMPTOMS: "Do you have any other symptoms?"  (e.g., abdominal pain, vomiting, dizziness, fever)     Not sure, having female problem so some abd pain, nausea once 10. PREGNANCY: "Is there any chance you are pregnant?" "When was your last menstrual period?"       No LMP just finished cycle the first of the month  Protocols used: RECTAL  BLEEDING-A-AH

## 2017-11-09 ENCOUNTER — Ambulatory Visit: Payer: Commercial Managed Care - PPO | Admitting: Family Medicine

## 2017-12-20 DIAGNOSIS — Z6834 Body mass index (BMI) 34.0-34.9, adult: Secondary | ICD-10-CM | POA: Diagnosis not present

## 2017-12-20 DIAGNOSIS — Z01419 Encounter for gynecological examination (general) (routine) without abnormal findings: Secondary | ICD-10-CM | POA: Diagnosis not present

## 2017-12-30 DIAGNOSIS — E1165 Type 2 diabetes mellitus with hyperglycemia: Secondary | ICD-10-CM | POA: Diagnosis not present

## 2017-12-30 DIAGNOSIS — K760 Fatty (change of) liver, not elsewhere classified: Secondary | ICD-10-CM | POA: Diagnosis not present

## 2017-12-30 DIAGNOSIS — E669 Obesity, unspecified: Secondary | ICD-10-CM | POA: Diagnosis not present

## 2018-01-10 DIAGNOSIS — N946 Dysmenorrhea, unspecified: Secondary | ICD-10-CM | POA: Diagnosis not present

## 2018-01-10 DIAGNOSIS — R102 Pelvic and perineal pain: Secondary | ICD-10-CM | POA: Diagnosis not present

## 2018-02-20 ENCOUNTER — Encounter: Payer: Self-pay | Admitting: Family Medicine

## 2018-02-22 NOTE — Telephone Encounter (Signed)
Dr. Sarajane Jews please advise if you could send in the insulin for this pt.  Thanks

## 2018-02-27 ENCOUNTER — Other Ambulatory Visit: Payer: Self-pay | Admitting: Family Medicine

## 2018-02-28 NOTE — Telephone Encounter (Signed)
I am not comfortable with t his because Lantus is not mentioned in her chart. Follow up with [name] or his partners

## 2018-03-02 ENCOUNTER — Other Ambulatory Visit: Payer: Self-pay | Admitting: Family Medicine

## 2018-03-13 DIAGNOSIS — E669 Obesity, unspecified: Secondary | ICD-10-CM | POA: Diagnosis not present

## 2018-03-13 DIAGNOSIS — K760 Fatty (change of) liver, not elsewhere classified: Secondary | ICD-10-CM | POA: Diagnosis not present

## 2018-03-13 DIAGNOSIS — Z794 Long term (current) use of insulin: Secondary | ICD-10-CM | POA: Diagnosis not present

## 2018-03-13 DIAGNOSIS — E1165 Type 2 diabetes mellitus with hyperglycemia: Secondary | ICD-10-CM | POA: Diagnosis not present

## 2018-03-19 ENCOUNTER — Other Ambulatory Visit: Payer: Self-pay | Admitting: Family Medicine

## 2018-03-20 MED ORDER — ESCITALOPRAM OXALATE 20 MG PO TABS: 20.0000 mg | ORAL_TABLET | Freq: Every day | ORAL | 3 refills | Status: DC

## 2018-03-20 NOTE — Addendum Note (Signed)
Addended by: Beckie Busing on: 03/20/2018 08:17 AM   Modules accepted: Orders

## 2018-03-20 NOTE — Telephone Encounter (Signed)
Pt calling to check status. Please advise

## 2018-04-02 DIAGNOSIS — R319 Hematuria, unspecified: Secondary | ICD-10-CM | POA: Diagnosis not present

## 2018-04-02 DIAGNOSIS — R300 Dysuria: Secondary | ICD-10-CM | POA: Diagnosis not present

## 2018-04-02 DIAGNOSIS — N390 Urinary tract infection, site not specified: Secondary | ICD-10-CM | POA: Diagnosis not present

## 2018-04-22 ENCOUNTER — Other Ambulatory Visit: Payer: Self-pay | Admitting: Family Medicine

## 2018-04-25 NOTE — Progress Notes (Signed)
[facility]  PATIENT: Laurie Sutton DOB: Dec 05, 1980   REASON FOR VISIT: Follow-up for migraine  HISTORY FROM: Patient    HISTORY OF PRESENT ILLNESS:UPDATE 1/15/2020CM Laurie Sutton, Sutton year old female returns for follow-up with history of migraine headaches.  She reports she has had 2 migraines in the last 6 months.  She woke up with a mild headache this morning however when checking her fasting blood sugar it was 170.  She has had some adjustments to her insulin dose.  Weather changes are a migraine trigger for her.  She really does not have any food triggers.  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  She has not missed any work.  She returns for reevaluation    UPDATE 6/26/2019CM Laurie Sutton, Sutton year old female returns for follow-up with history of migraine headaches.  She was placed on nortriptyline by Dr. Jannifer Franklin and is currently taking 30 mg at night.  She has had one migraine in the last 3 months.  She takes Maxalt acutely.  She is not aware of any migraine triggers such as foods.  She works for the school system out for the summer.  She returns for reevaluation  06/29/17 Laurie Sutton. Laurie Sutton is a 38 year old right-handed white female with a history of diabetes and obesity.  The patient has migraine headaches, she was last seen in 2016.  At that time, she was doing quite well with her migraines, the headaches were occurring less than once a month.  Within the last 3 or 4 months, the patient indicates that her headaches have increased in frequency and are now occurring 3 or 4 times a week.  The patient remains on propranolol taking 40 mg twice daily.  She has tolerated the medication quite well.  The headaches are not severe, she has not had to use Maxalt in quite some time.  The patient will take Excedrin Migraine, she drinks 2-3 soft drinks a day as well.  The patient indicates that may be some increased stress that may have resulted in the heightened frequency of her  headache.  The headaches are bifrontal and temporal in nature unassociated with any nausea or vomiting.  She is not missing work because of the headache.  She returns to this office for an evaluation.  She has some anxiety issues even while on propranolol, she takes Lexapro as well.  She does have gabapentin 100 mg capsules to take at night for a diabetic neuropathy, she rarely takes these capsules. REVIEW OF SYSTEMS: Full 14 system review of systems performed and notable only for those listed, all others are neg:  Constitutional: neg  Cardiovascular: neg Ear/Nose/Throat: neg  Skin: neg Eyes: neg Respiratory: neg Gastroitestinal: neg  Hematology/Lymphatic: neg  Endocrine: neg Musculoskeletal:neg Allergy/Immunology: neg Neurological: History of migraine Psychiatric: Depression anxiety Sleep : neg   ALLERGIES: Allergies  Allergen Reactions  . Amoxicillin Hives and Itching  . Azithromycin   . Latex   . Penicillins Hives    Has patient had a PCN reaction causing immediate rash, facial/tongue/throat swelling, SOB or lightheadedness with hypotension: yes. Rash  Has patient had a PCN reaction causing severe rash involving mucus membranes or skin necrosis: Yes- rash and hives all over body  Has patient had a PCN reaction that required hospitalization No Has patient had a PCN reaction occurring within the last 10 years: Yes  If all of the above answers are "NO", then may proceed with Cephalosporin use.   . Sitagliptin Other (See Comments)    Body  aches all day Other reaction(s): Arthralgia (Joint Pain)    HOME MEDICATIONS: Outpatient Medications Prior to Visit  Medication Sig Dispense Refill  . BAYER CONTOUR NEXT TEST test strip USE AS INSTRUCTED TO CHECK BLOOD SUGAR ONCE A DAY DX CODE E11.9 50 each 3  . BAYER MICROLET LANCETS lancets Use as instructed to check blood sugar once a day dx code E11.9 100 each 1  . clotrimazole-betamethasone (LOTRISONE) cream Apply 1 application topically  2 (two) times daily as needed. 30 g 2  . Empagliflozin-Metformin HCl ER (SYNJARDY XR) 08-998 MG TB24 Take 2 tablets by mouth every morning. (Patient taking differently: Take 3 tablets by mouth at bedtime. ) 30 tablet 0  . escitalopram (LEXAPRO) 20 MG tablet Take 1 tablet (20 mg total) by mouth daily. 90 tablet 3  . esomeprazole (NEXIUM) 20 MG packet Take 20 mg by mouth daily before breakfast.    . gabapentin (NEURONTIN) 100 MG capsule Take 1 capsule (100 mg total) by mouth 3 (three) times daily. (Patient taking differently: Take 100 mg by mouth 3 (three) times daily as needed. ) 90 capsule 3  . insulin aspart (NOVOLOG) 100 UNIT/ML injection Inject 12-20 Units into the skin as needed for high blood sugar (If CBG is over 200).    . insulin glargine (LANTUS) 100 UNIT/ML injection Inject 44 Units into the skin at bedtime.    . meclizine (ANTIVERT) 25 MG tablet Take 1 tablet (25 mg total) by mouth every 4 (four) hours as needed for dizziness. Sutton tablet 2  . metFORMIN (GLUCOPHAGE) 500 MG tablet Take 3 tablets by mouth at bedtime.    . montelukast (SINGULAIR) 10 MG tablet TAKE 1 TABLET BY MOUTH EVERYDAY AT BEDTIME 90 tablet 3  . nortriptyline (PAMELOR) 10 MG capsule Take 3 capsules (30 mg total) by mouth at bedtime. 90 capsule 6  . ondansetron (ZOFRAN) 8 MG tablet Take 1 tablet (8 mg total) by mouth every 6 (six) hours as needed for nausea. 30 tablet 1  . propranolol (INDERAL) 40 MG tablet Take 1 tablet (40 mg total) by mouth 2 (two) times daily. Sutton tablet 0  . propranolol (INDERAL) 40 MG tablet TAKE 1 TABLET BY MOUTH TWICE A DAY NEEDS APPT FOR REFILL Sutton tablet 0  . rizatriptan (MAXALT) 10 MG tablet Take 1 tablet (10 mg total) by mouth 3 (three) times daily as needed. 10 tablet 5  . Semaglutide (OZEMPIC) 1 MG/DOSE SOPN Inject into the skin once a week.    . terconazole (TERAZOL 3) 0.8 % vaginal cream Place 1 applicator vaginally at bedtime. 20 g 5  . zolpidem (AMBIEN) 10 MG tablet TAKE 1 TABLET BY MOUTH AT  BEDTIME AS NEEDED 30 tablet 5  . fluticasone (FLONASE) 50 MCG/ACT nasal spray Place 1 spray into both nostrils 2 (two) times daily. (Patient not taking: Reported on 04/26/2018) 16 g 2  . omeprazole (PRILOSEC) 40 MG capsule Take 1 capsule (40 mg total) by mouth daily. (Patient not taking: Reported on 04/26/2018) 90 capsule 3   No facility-administered medications prior to visit.     PAST MEDICAL HISTORY: Past Medical History:  Diagnosis Date  . Anxiety   . Depression   . Diabetes mellitus without complication ([place])   . GERD (gastroesophageal reflux disease)   . Gynecological examination    sees Dr. Josefa Half  . Migraines   . Murmur, cardiac     PAST SURGICAL HISTORY: Past Surgical History:  Procedure Laterality Date  . CESAREAN SECTION  FAMILY HISTORY: Family History  Problem Relation Age of Onset  . Hyperlipidemia Mother   . Stroke Mother   . Diabetes Mother   . Migraines Mother   . Diabetes Father   . Migraines Maternal Aunt   . Migraines Maternal Grandmother   . Cerebral aneurysm Cousin   . Diabetes Maternal Grandfather   . Heart disease Neg Hx     SOCIAL HISTORY: Social History   Socioeconomic History  . Marital status: Married    Spouse name: Not on file  . Number of children: 1  . Years of education: Not on file  . Highest education level: Not on file  Occupational History  . Not on file  Social Needs  . Financial resource strain: Not on file  . Food insecurity:    Worry: Not on file    Inability: Not on file  . Transportation needs:    Medical: Not on file    Non-medical: Not on file  Tobacco Use  . Smoking status: Never Smoker  . Smokeless tobacco: Never Used  Substance and Sexual Activity  . Alcohol use: No    Alcohol/week: 0.0 standard drinks  . Drug use: No  . Sexual activity: Yes    Birth control/protection: None  Lifestyle  . Physical activity:    Days per week: Not on file    Minutes per session: Not on file  . Stress: Not on  file  Relationships  . Social connections:    Talks on phone: Not on file    Gets together: Not on file    Attends religious service: Not on file    Active member of club or organization: Not on file    Attends meetings of clubs or organizations: Not on file    Relationship status: Not on file  . Intimate partner violence:    Fear of current or ex partner: Not on file    Emotionally abused: Not on file    Physically abused: Not on file    Forced sexual activity: Not on file  Other Topics Concern  . Not on file  Social History Narrative  . Not on file     PHYSICAL EXAM  Vitals:   04/26/18 0837  BP: 113/72  Pulse: 78  Height: 5' 6"  (1.676 m)   Body mass index is 33.57 kg/m.  Generalized: Well developed, in no acute distress  Head: normocephalic and atraumatic,. Oropharynx benign  Neck: Supple,  Musculoskeletal: No deformity   Neurological examination   Mentation: Alert oriented to time, place, history taking. Attention span and concentration appropriate. Recent and remote memory intact.  Follows all commands speech and language fluent.   Cranial nerve II-XII: .Pupils were equal round reactive to light extraocular movements were full, visual field were full on confrontational test. Facial sensation and strength were normal. hearing was intact to finger rubbing bilaterally. Uvula tongue midline. head turning and shoulder shrug were normal and symmetric.Tongue protrusion into cheek strength was normal. Motor: normal bulk and tone, full strength in the BUE, BLE, fine finger movements normal, no pronator drift. No focal weakness Sensory: normal and symmetric to light touch,  Coordination: finger-nose-finger, heel-to-shin bilaterally, no dysmetria Reflexes: Symmetric upper and lower plantar responses were flexor bilaterally. Gait and Station: Rising up from seated position without assistance, normal stance,  moderate stride, good arm swing, smooth turning, able to perform  tiptoe, and heel walking without difficulty. Tandem gait is steady  DIAGNOSTIC DATA (LABS, IMAGING, TESTING) - I reviewed patient records,  labs, notes, testing and imaging myself where available.  Lab Results  Component Value Date   WBC 8.4 08/19/2017   HGB 14.3 08/19/2017   HCT 41 08/19/2017   MCV 74.4 (L) 12/21/2014   PLT 273 08/19/2017      Component Value Date/Time   NA 138 08/19/2017   K 4.2 08/19/2017   CL 102 06/07/2016 0802   CO2 23 06/07/2016 0802   GLUCOSE 122 (H) 06/07/2016 0802   BUN 8 08/19/2017   CREATININE 0.5 08/19/2017   CREATININE 0.56 06/07/2016 0802   CALCIUM 9.6 06/07/2016 0802   PROT 7.3 06/07/2016 0802   ALBUMIN 4.3 06/07/2016 0802   AST 69 (H) 06/07/2016 0802   ALT 70 (H) 06/07/2016 0802   ALKPHOS 78 06/07/2016 0802   BILITOT 0.6 06/07/2016 0802   GFRNONAA >Sutton 12/21/2014 1116   GFRAA >Sutton 12/21/2014 1116   Lab Results  Component Value Date   CHOL 169 08/19/2017   HDL Sutton 08/19/2017   LDLCALC 91 08/19/2017   TRIG 202 (A) 08/19/2017   CHOLHDL 4 05/12/2015   Lab Results  Component Value Date   HGBA1C 6.2 08/19/2017    Lab Results  Component Value Date   TSH 0.97 08/19/2017     ASSESSMENT AND PLAN  38 y.o. year old female  has a past medical history of Anxiety, Depression, Diabetes mellitus without complication ([place]), GERD (gastroesophageal reflux disease), and migraine headaches doing well on nortriptyline 30 mg at night and propanolol 40 mg twice daily.  She has had 2 migraine in the last 6 months.  She takes Maxalt acutely    Continue nortriptyline 30 mg at night will refill Continue Inderal prescribed by primary care Continue Maxalt acutely will refill Avoid  migraine triggers Call for worsening of headaches F/U yearly Dennie Bible, [facility], [facility], [facility] [address], [place], [place] [address] [phone]

## 2018-04-26 ENCOUNTER — Encounter: Payer: Self-pay | Admitting: Nurse Practitioner

## 2018-04-26 ENCOUNTER — Ambulatory Visit: Payer: Commercial Managed Care - PPO | Admitting: Nurse Practitioner

## 2018-04-26 VITALS — BP 113/72 | HR 78 | Ht 66.0 in

## 2018-04-26 DIAGNOSIS — G43001 Migraine without aura, not intractable, with status migrainosus: Secondary | ICD-10-CM

## 2018-04-26 MED ORDER — RIZATRIPTAN BENZOATE 10 MG PO TABS: 10.0000 mg | ORAL_TABLET | Freq: Three times a day (TID) | ORAL | 11 refills | Status: DC | PRN

## 2018-04-26 MED ORDER — NORTRIPTYLINE HCL 10 MG PO CAPS: 30.0000 mg | ORAL_CAPSULE | Freq: Every day | ORAL | 11 refills | Status: DC

## 2018-04-26 NOTE — Patient Instructions (Signed)
Continue nortriptyline 30 mg at night will refill Continue Maxalt acutely will refill Avoid  migraine triggers Call for worsening of headaches F/U yearly

## 2018-04-26 NOTE — Progress Notes (Signed)
I have read the note, and I agree with the clinical assessment and plan.  Charles K Willis   

## 2018-05-26 DIAGNOSIS — E1165 Type 2 diabetes mellitus with hyperglycemia: Secondary | ICD-10-CM | POA: Diagnosis not present

## 2018-05-26 DIAGNOSIS — E669 Obesity, unspecified: Secondary | ICD-10-CM | POA: Diagnosis not present

## 2018-05-26 DIAGNOSIS — K760 Fatty (change of) liver, not elsewhere classified: Secondary | ICD-10-CM | POA: Diagnosis not present

## 2018-05-27 ENCOUNTER — Other Ambulatory Visit: Payer: Self-pay | Admitting: Family Medicine

## 2018-06-14 ENCOUNTER — Other Ambulatory Visit: Payer: Self-pay | Admitting: Family Medicine

## 2018-06-16 DIAGNOSIS — E669 Obesity, unspecified: Secondary | ICD-10-CM | POA: Diagnosis not present

## 2018-06-16 DIAGNOSIS — K760 Fatty (change of) liver, not elsewhere classified: Secondary | ICD-10-CM | POA: Diagnosis not present

## 2018-06-16 DIAGNOSIS — E1165 Type 2 diabetes mellitus with hyperglycemia: Secondary | ICD-10-CM | POA: Diagnosis not present

## 2018-06-27 ENCOUNTER — Ambulatory Visit: Payer: Commercial Managed Care - PPO | Admitting: *Deleted

## 2018-07-17 ENCOUNTER — Other Ambulatory Visit: Payer: Self-pay

## 2018-07-17 ENCOUNTER — Encounter: Payer: Self-pay | Admitting: Family Medicine

## 2018-07-17 ENCOUNTER — Ambulatory Visit (INDEPENDENT_AMBULATORY_CARE_PROVIDER_SITE_OTHER): Payer: Commercial Managed Care - PPO | Admitting: Family Medicine

## 2018-07-17 DIAGNOSIS — N390 Urinary tract infection, site not specified: Secondary | ICD-10-CM

## 2018-07-17 NOTE — Telephone Encounter (Signed)
Spoke with pt and she is aware of appt today with Dr. Janee Morn at 3:15

## 2018-07-17 NOTE — Progress Notes (Signed)
   Subjective:    Patient ID: Laurie Sutton, female    DOB: [birth year], 38 y.o.   MRN: [id number]  HPI Virtual Visit via Telephone Note  I connected with the patient on 07/17/18 at  3:15 PM EDT by [email or web address] and verified that I am speaking with the correct person using two identifiers. We had technical difficulty with both video and audio, so we spoke over the telephone.    I discussed the limitations, risks, security and privacy concerns of performing an evaluation and management service by telephone and the availability of in person appointments. I also discussed with the patient that there may be a patient responsible charge related to this service. The patient expressed understanding and agreed to proceed.  Location patient: home Location provider: work or home office Participants present for the call: patient, provider Patient did not have a visit in the prior 7 days to address this/these issue(s).   History of Present Illness: For 2 days she has had urinary burning and urgency, as well as lower back pain. No nausea or fever.    Observations/Objective: Patient sounds cheerful and well on the phone. I do not appreciate any SOB. Speech and thought processing are grossly intact. Patient reported vitals:  Assessment and Plan: She has a UTI. Treat with Cipro, and she will drink plenty of water.  Alysia Penna, MD   Follow Up Instructions:  @   [id number] 5-10 [id number] 11-20 [id number] 21-30 I did not refer this patient for an OV in the next 24 hours for this/these issue(s).  I discussed the assessment and treatment plan with the patient. The patient was provided an opportunity to ask questions and all were answered. The patient agreed with the plan and demonstrated an understanding of the instructions.   The patient was advised to call back or seek an in-person evaluation if the symptoms worsen or if the condition fails to improve as anticipated.  I provided 12  minutes of  non-face-to-face time during this encounter.   Alysia Penna, MD    Review of Systems     Objective:   Physical Exam        Assessment & Plan:

## 2018-07-20 ENCOUNTER — Telehealth: Payer: Self-pay | Admitting: *Deleted

## 2018-07-20 ENCOUNTER — Encounter: Payer: Self-pay | Admitting: Family Medicine

## 2018-07-20 MED ORDER — NITROFURANTOIN MONOHYD MACRO 100 MG PO CAPS: 100.0000 mg | ORAL_CAPSULE | Freq: Two times a day (BID) | ORAL | 0 refills | Status: DC

## 2018-07-20 NOTE — Telephone Encounter (Signed)
Copied from [place] [id number]. Topic: General - Other >> Jul 20, 2018  2:24 PM Leward Quan A wrote: Reason for CRM: Patient called to say that she is still waiting on an [facility]. Been waiting for 3 days since web visit on 07/17/2018. Ph# [phone]

## 2018-07-20 NOTE — Telephone Encounter (Signed)
Spoke with Dr. Sarajane Jews and he stated that he wanted to send in macrodantin 100 mg  #14  1 po BID until gone. With no refills. This has been sent to the pharmacy

## 2018-07-20 NOTE — Telephone Encounter (Signed)
meds have been sent to the pharmacy and I have sent the pt a my chart message back to make her aware.

## 2018-07-24 ENCOUNTER — Encounter: Payer: Self-pay | Admitting: Family Medicine

## 2018-07-25 MED ORDER — NITROFURANTOIN MONOHYD MACRO 100 MG PO CAPS: 100.0000 mg | ORAL_CAPSULE | Freq: Two times a day (BID) | ORAL | 0 refills | Status: DC

## 2018-07-25 MED ORDER — CIPROFLOXACIN HCL 500 MG PO TABS: 500.0000 mg | ORAL_TABLET | Freq: Two times a day (BID) | ORAL | 0 refills | Status: DC

## 2018-07-25 NOTE — Telephone Encounter (Signed)
That is correct, she has been taking Macrobid. Instead call in Cipro 500 mg bid for 7 days

## 2018-07-25 NOTE — Telephone Encounter (Signed)
Stop the Cipro and instead call in Macrobid 100 mg bid for 7days

## 2018-07-25 NOTE — Telephone Encounter (Signed)
Dr. Sarajane Jews pt was already on the macrobid-- can we send in the cipro for her take?  Thanks

## 2018-07-25 NOTE — Addendum Note (Signed)
Addended by: Elie Confer on: 07/25/2018 04:02 PM   Modules accepted: Orders

## 2018-07-25 NOTE — Telephone Encounter (Signed)
Dr. Fry please advise. Thanks  

## 2018-08-02 ENCOUNTER — Encounter: Payer: Self-pay | Admitting: Family Medicine

## 2018-08-02 MED ORDER — FLUCONAZOLE 150 MG PO TABS: 150.0000 mg | ORAL_TABLET | Freq: Once | ORAL | 5 refills | Status: AC

## 2018-08-02 MED ORDER — CLOTRIMAZOLE-BETAMETHASONE 1-0.05 % EX CREA: 1.0000 "application " | TOPICAL_CREAM | Freq: Two times a day (BID) | CUTANEOUS | 2 refills | Status: DC

## 2018-08-02 NOTE — Telephone Encounter (Signed)
Dr. Fry please advise. Thanks  

## 2018-08-02 NOTE — Telephone Encounter (Signed)
Call in Lotrisone cream to apply BID as needed, 30 grams with 2 rf, also Diflucan 150 mg #1 with 5 rf

## 2018-08-11 ENCOUNTER — Encounter: Payer: Self-pay | Admitting: Family Medicine

## 2018-08-11 ENCOUNTER — Other Ambulatory Visit: Payer: Self-pay | Admitting: Family Medicine

## 2018-08-13 DIAGNOSIS — N3000 Acute cystitis without hematuria: Secondary | ICD-10-CM | POA: Diagnosis not present

## 2018-08-15 NOTE — Telephone Encounter (Signed)
Call in [place]er 100 mg bid for 7 days

## 2018-08-15 NOTE — Telephone Encounter (Signed)
Dr. Fry please advise. Thanks  

## 2018-08-16 MED ORDER — NITROFURANTOIN MONOHYD MACRO 100 MG PO CAPS: 100.0000 mg | ORAL_CAPSULE | Freq: Two times a day (BID) | ORAL | 0 refills | Status: DC

## 2018-08-16 NOTE — Telephone Encounter (Signed)
Please advise. Should the patient continue this medication?

## 2018-08-16 NOTE — Telephone Encounter (Signed)
rx sent to the pharmacy.

## 2018-08-17 NOTE — Telephone Encounter (Signed)
Dr. Fry please advise. Thanks  

## 2018-08-18 NOTE — Telephone Encounter (Signed)
Cancel the [place]. Call in Cipro 500 mg bid for 7 days, also Diflucan 150 mg prn, #1 with 5 rf

## 2018-08-21 MED ORDER — FLUCONAZOLE 150 MG PO TABS: 150.0000 mg | ORAL_TABLET | Freq: Once | ORAL | 5 refills | Status: AC

## 2018-08-21 MED ORDER — CIPROFLOXACIN HCL 500 MG PO TABS: 500.0000 mg | ORAL_TABLET | Freq: Two times a day (BID) | ORAL | 0 refills | Status: DC

## 2018-08-21 NOTE — Addendum Note (Signed)
Addended by: Elie Confer on: 08/21/2018 08:09 AM   Modules accepted: Orders

## 2018-08-23 DIAGNOSIS — N3020 Other chronic cystitis without hematuria: Secondary | ICD-10-CM | POA: Diagnosis not present

## 2018-08-23 DIAGNOSIS — R300 Dysuria: Secondary | ICD-10-CM | POA: Diagnosis not present

## 2018-09-15 ENCOUNTER — Encounter: Payer: Commercial Managed Care - PPO | Attending: Internal Medicine | Admitting: Dietician

## 2018-09-15 ENCOUNTER — Encounter: Payer: Self-pay | Admitting: Dietician

## 2018-09-15 ENCOUNTER — Other Ambulatory Visit: Payer: Self-pay

## 2018-09-15 DIAGNOSIS — E119 Type 2 diabetes mellitus without complications: Secondary | ICD-10-CM | POA: Diagnosis present

## 2018-09-15 NOTE — Progress Notes (Signed)
Medical Nutrition Therapy:  Appt start time: 1430 end time:  1600. In person visit.  Assessment:  Primary concerns today: Patient is here today alone.  She would like to get a better understanding of diabetes, blood glucose and healthier approach.  She states that she is tired of her BG controlling her.   History includes Type 2 diabetes 12/2013, GDM 2008, neuropathy, GERD, fatty liver, history of kidney stones. A1C was 6.2% 08/2017.  She states that she gave up all sweetened drinks and was much better with her eating habits but became very discouraged that she was not losing weight and "gave it all up". A1C January 2020 8.2% decreased from 9.5% 12/12/2017.  Weight hx: 214 lbs today 211 lbs 05/2018 140 lbs lowest adult weight Gained 15 lbs when she was 19 in 1 month on birth control and started the course of weight gain.  Bed rest for 7 months with pregnancy 5 years later and ate what she wished.  She had GDM with pregnancy in 2008.  "Never fought to lose weight". Weight since DM started 198-215 lbs  220 lbs highest weight that she has been.   Patient lives with her husband, 2 children (77, 65), niece (67) as well as parents are getting ready to move in with them.   Patient does the shopping and cooking but her mother will also help.  Her mom cooks "old style"- a lot of fried).  Patient has been baking, or air fryiing mostly. She states that she is a very picky eater. Snacks "a-lot".  Not always hungry.  Eats because her husband is eating.  She has started to snack on pickles or popcorn in the evening. She will also snack a lot before a meal as she is very hungry.  Then eats dinner.  Patient lives with her husband, 2 children (43, 7), niece (48) as well as parents are getting ready to move in with them.   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  Increased  lifting.  Patient does the shopping and cooking but her mother will also help.  Her mom cooks "old style"- a lot of fried).  Patient has been baking, or air fryiing mostly. She last saw a dietitian in 2008 when she was pregnant and with GDM.  Preferred Learning Style:   No preference indicated   Learning Readiness:   Contemplating    MEDICATIONS: see list to include Lantus 72 units q HS, Metformin ER 150 grams per HS, Novolog 34 units before each meal, Trulicity.   DIETARY INTAKE: Avoided foods include many vegetables and fruits (picky eater) - texture and smell issues  24-hr recall:  B ( AM): pack of crackers (something just to take my medicine-not a breakfast eater)  Snk (11:30AM): none 10:45-11 AM): Pizza or taco salad or PB&J or anything else that they are serving at school Snk ( PM): apple, pickle, popcorn, or sweets but less snacks since school has ended D ( PM): baked chicken or pork chops or roast, rice or mashed potatoes, corm or peas or baked beans or scalloped potatoes- sometimes seconds and sometimes WITH ice cream or donut or brownies or 2 apples Snk ( PM): popcorn or pickles or tomatoes or apple Beverages: water, flavored water, zero Gatorade, occasional sweet tea (used to drink 6 regular soda per day- now very rare)  Usual physical activity: Zumba  classes at church prior to [place], walks at the park about 1/2 mile per day until they moved (3 weeks ago).  Estimated needs: 1500 calories 75 grams protein  Progress Towards Goal(s):  In progress.   Nutritional Diagnosis:  NB-1.1 Food and nutrition-related knowledge deficit As related to balance of carbohydrates, protein, and fat.  As evidenced by diet hx and patient report.    Intervention:  Nutrition counseling/education related to type 2 diabetes. counseling and diabetes education initiated. Discussed Carb Counting by food group as method of portion control, reading food labels, and benefits of increased activity.  Also discussed basic physiology of Diabetes, target BG ranges pre and post meals, and A1c.   Mindfulness:  Choices  Portion sizes  Eat slowly  Stop when you are satisfied Choose beverages without carbohydrates.   Great job on changing this! Consider options for breakfast. What can you do rather than the snack at night when you are not hungry? Aim for 3 Carb Choices per meal (45 grams) +/- 1 either way  Aim for 0-1 Carbs per snack if hungry  Include protein in moderation with your meals and snacks Consider reading food labels for Total Carbohydrate of foods Consider  increasing your activity level by walking or you tube Zumba or other you tube exercise for 30 minutes daily as tolerated Continue checking BG at alternate times per day  Continue taking medication as directed by MD  Teaching Method Utilized:  Visual Auditory Hands on  Handouts given during visit include: How to Thrive:  A Guide for Your Journey with Diabetes by the ADA Food Label handouts Meal Plan Card Breakfast ideas  Snack sheet  Barriers to learning/adherence to lifestyle change: picky eater  Demonstrated degree of understanding via:  Teach Back   Monitoring/Evaluation:  Dietary intake, exercise, label reading, and body weight in 1 month(s).

## 2018-09-15 NOTE — Patient Instructions (Signed)
Mindfulness:  Choices  Portion sizes  Eat slowly  Stop when you are satisfied Choose beverages without carbohydrates.   Great job on changing this! Consider options for breakfast. What can you do rather than the snack at night when you are not hungry? Aim for 3 Carb Choices per meal (45 grams) +/- 1 either way  Aim for 0-1 Carbs per snack if hungry  Include protein in moderation with your meals and snacks Consider reading food labels for Total Carbohydrate of foods Consider  increasing your activity level by walking or you tube Zumba or other you tube exercise for 30 minutes daily as tolerated Continue checking BG at alternate times per day  Continue taking medication as directed by MD

## 2018-09-26 ENCOUNTER — Other Ambulatory Visit: Payer: Self-pay | Admitting: Family Medicine

## 2018-10-19 ENCOUNTER — Ambulatory Visit: Payer: Commercial Managed Care - PPO | Admitting: Dietician

## 2018-12-25 ENCOUNTER — Other Ambulatory Visit: Payer: Self-pay | Admitting: Family Medicine

## 2019-01-04 ENCOUNTER — Other Ambulatory Visit: Payer: Self-pay | Admitting: Family Medicine

## 2019-01-17 ENCOUNTER — Telehealth: Payer: Self-pay | Admitting: Neurology

## 2019-01-17 MED ORDER — PROPRANOLOL HCL 40 MG PO TABS: 40.0000 mg | ORAL_TABLET | Freq: Two times a day (BID) | ORAL | 2 refills | Status: DC

## 2019-01-17 NOTE — Telephone Encounter (Signed)
Pt is needing a refill on her propranolol (INDERAL) 40 MG tablet [facility] [address]

## 2019-01-17 NOTE — Telephone Encounter (Signed)
Refill has been sent.  °

## 2019-01-24 ENCOUNTER — Other Ambulatory Visit: Payer: Self-pay | Admitting: Family Medicine

## 2019-02-18 ENCOUNTER — Other Ambulatory Visit: Payer: Self-pay | Admitting: Family Medicine

## 2019-03-19 ENCOUNTER — Telehealth (INDEPENDENT_AMBULATORY_CARE_PROVIDER_SITE_OTHER): Payer: Commercial Managed Care - PPO | Admitting: Family Medicine

## 2019-03-19 ENCOUNTER — Encounter: Payer: Self-pay | Admitting: Family Medicine

## 2019-03-19 ENCOUNTER — Other Ambulatory Visit: Payer: Self-pay

## 2019-03-19 VITALS — Temp 97.1°F | Ht 66.0 in | Wt 214.0 lb

## 2019-03-19 DIAGNOSIS — Z20822 Contact with and (suspected) exposure to covid-19: Secondary | ICD-10-CM

## 2019-03-19 DIAGNOSIS — B349 Viral infection, unspecified: Secondary | ICD-10-CM | POA: Diagnosis not present

## 2019-03-19 NOTE — Progress Notes (Signed)
Virtual Visit via Telephone Note  I connected with the patient on 03/19/19 at  3:45 PM EST by telephone and verified that I am speaking with the correct person using two identifiers. We attempted to connect virtually but we had technical difficulties with the audio and video.     I discussed the limitations, risks, security and privacy concerns of performing an evaluation and management service by telephone and the availability of in person appointments. I also discussed with the patient that there may be a patient responsible charge related to this service. The patient expressed understanding and agreed to proceed.  Location patient: home Location provider: work or home office Participants present for the call: patient, provider Patient did not have a visit in the prior 7 days to address this/these issue(s).   History of Present Illness: Here for 5 days of diarrhea, a dry cough, lower abdominal cramps, and body aches. No fever or headaches. No nausea or vomiting. She went for a Covid-19 test this morning so results are pending. She is drinking fluids.    Observations/Objective: Patient sounds cheerful and well on the phone. I do not appreciate any SOB. Speech and thought processing are grossly intact. Patient reported vitals:  Assessment and Plan: She has viral symptoms and she should assume she has a Covid infection at least until the results are back. This means she needs to quarantine herself at home and she understands. She can use Imodium for the diarrhea, and she can use Mucinex and Robitussin for the cough. Alysia Penna, MD    Follow Up Instructions:     [phone] 5-10 [phone] 11-20 [phone] 21-30 I did not refer this patient for an OV in the next 24 hours for this/these issue(s).  I discussed the assessment and treatment plan with the patient. The patient was provided an opportunity to ask questions and all were answered. The patient agreed with the plan and demonstrated an  understanding of the instructions.   The patient was advised to call back or seek an in-person evaluation if the symptoms worsen or if the condition fails to improve as anticipated.  I provided 14 minutes of non-face-to-face time during this encounter.   Alysia Penna, MD

## 2019-03-20 LAB — NOVEL CORONAVIRUS, NAA: SARS-CoV-2, NAA: NOT DETECTED

## 2019-03-23 ENCOUNTER — Telehealth: Payer: Self-pay | Admitting: Family Medicine

## 2019-03-23 MED ORDER — LEVOFLOXACIN 500 MG PO TABS: 500.0000 mg | ORAL_TABLET | Freq: Every day | ORAL | 0 refills | Status: DC

## 2019-03-23 NOTE — Telephone Encounter (Signed)
Message routed to PCP.

## 2019-03-23 NOTE — Telephone Encounter (Signed)
Spoke to pt and she stated no changes since last visit. Pt stated she has been taking Mucinex with mild relief. Pt has a productive cough with green phlegm. Pt also still having back pain that is getting a little better. Pt was tested this Monday with Neg results.

## 2019-03-23 NOTE — Telephone Encounter (Signed)
Patient is calling to report that she still is not feeling better. Patient reporting the continuing of the following symptoms: Back Pain . Diarrhea . Nasal Congestion . Abdominal Pain Back Pain . Diarrhea . Nasal Congestion . Abdominal Pain Patient is takening OTC mucinex. Patient took [place] test Monday and it came back Negative. Please advise. [phone]

## 2019-03-23 NOTE — Telephone Encounter (Signed)
This could have settled into a sinus infection. Call in Levaquin 500 mg daily for 10 days

## 2019-03-23 NOTE — Telephone Encounter (Signed)
Please advise 

## 2019-03-23 NOTE — Telephone Encounter (Signed)
Rx refilled.

## 2019-03-27 ENCOUNTER — Encounter: Payer: Self-pay | Admitting: Family Medicine

## 2019-04-09 ENCOUNTER — Telehealth (INDEPENDENT_AMBULATORY_CARE_PROVIDER_SITE_OTHER): Payer: Commercial Managed Care - PPO | Admitting: Family Medicine

## 2019-04-09 ENCOUNTER — Encounter: Payer: Self-pay | Admitting: Family Medicine

## 2019-04-09 ENCOUNTER — Other Ambulatory Visit: Payer: Self-pay

## 2019-04-09 DIAGNOSIS — J40 Bronchitis, not specified as acute or chronic: Secondary | ICD-10-CM | POA: Diagnosis not present

## 2019-04-09 MED ORDER — DOXYCYCLINE HYCLATE 100 MG PO CAPS: 100.0000 mg | ORAL_CAPSULE | Freq: Two times a day (BID) | ORAL | 0 refills | Status: DC

## 2019-04-09 NOTE — Progress Notes (Signed)
Virtual Visit via Telephone Note  I connected with the patient on 04/09/19 at 10:00 AM EST by telephone and verified that I am speaking with the correct person using two identifiers.   I discussed the limitations, risks, security and privacy concerns of performing an evaluation and management service by telephone and the availability of in person appointments. I also discussed with the patient that there may be a patient responsible charge related to this service. The patient expressed understanding and agreed to proceed.  Location patient: home Location provider: work or home office Participants present for the call: patient, provider Patient did not have a visit in the prior 7 days to address this/these issue(s).   History of Present Illness: Here for a lingering cough. We saw her on 03-19-19 for viral symptoms including bocy aches, abdominal cramps and a cough. She tested negative for the Covid-19 virus that day. Since then all other symptoms hve resolved, but she still has chest congestion and a dry cough. Taking Mucinex.    Observations/Objective: Patient sounds cheerful and well on the phone. I do not appreciate any SOB. Speech and thought processing are grossly intact. Patient reported vitals:  Assessment and Plan: Bronchitis, treat with Doxycycline.  Laurie Penna, MD   Follow Up Instructions:     [phone] 5-10 [phone] 11-20 9443 21-30 I did not refer this patient for an OV in the next 24 hours for this/these issue(s).  I discussed the assessment and treatment plan with the patient. The patient was provided an opportunity to ask questions and all were answered. The patient agreed with the plan and demonstrated an understanding of the instructions.   The patient was advised to call back or seek an in-person evaluation if the symptoms worsen or if the condition fails to improve as anticipated.  I provided 10 minutes of non-face-to-face time during this encounter.   Laurie Penna, MD

## 2019-04-10 NOTE — Telephone Encounter (Signed)
Call in Diflucan 150 mg #1 with 5 rf

## 2019-04-16 ENCOUNTER — Encounter: Payer: Self-pay | Admitting: Family Medicine

## 2019-04-16 NOTE — Telephone Encounter (Signed)
Set up a virtual visit so we can discuss this

## 2019-04-18 ENCOUNTER — Other Ambulatory Visit: Payer: Self-pay

## 2019-04-18 ENCOUNTER — Encounter: Payer: Self-pay | Admitting: Family Medicine

## 2019-04-18 ENCOUNTER — Telehealth (INDEPENDENT_AMBULATORY_CARE_PROVIDER_SITE_OTHER): Payer: Commercial Managed Care - PPO | Admitting: Family Medicine

## 2019-04-18 DIAGNOSIS — J019 Acute sinusitis, unspecified: Secondary | ICD-10-CM | POA: Diagnosis not present

## 2019-04-18 MED ORDER — LEVOFLOXACIN 500 MG PO TABS: 500.0000 mg | ORAL_TABLET | Freq: Every day | ORAL | 0 refills | Status: AC

## 2019-04-18 NOTE — Progress Notes (Signed)
Virtual Visit via Telephone Note  I connected with the patient on 04/18/19 at  1:15 PM EST by telephone and verified that I am speaking with the correct person using two identifiers.   I discussed the limitations, risks, security and privacy concerns of performing an evaluation and management service by telephone and the availability of in person appointments. I also discussed with the patient that there may be a patient responsible charge related to this service. The patient expressed understanding and agreed to proceed.  Location patient: home Location provider: work or home office Participants present for the call: patient, provider Patient did not have a visit in the prior 7 days to address this/these issue(s).   History of Present Illness: Here to follow up on symptoms. For the past 4 weeks she has dealt with stuffy head, PND, headaches, chest tightness, wheezing, SOB, and a dry cough. No NVD. She has had fevers to 100 degrees. She has been taking Doxycycline for the past 8 days, but she is not getting better. On 04-13-19 she went to an urgent care and got a steroid shot. A rapid test for the Covid-19 virus was negative and a send off test is still pending. She has an albuterol inhaler to use if needed, but she has not used this yet. Today her chest is still tight but her head feels more pressured. Drinking fluids. The steroid shot made her glucoses shoot up into the 600s at first, so she has been in constant contact with [name], her endocrinologist. He has adjusted her insulin, and now her glucoses are in the 400s.    Observations/Objective: Patient sounds cheerful and well on the phone. I do not appreciate any SOB. Speech and thought processing are grossly intact. Patient reported vitals:  Assessment and Plan: She has a bronchitis and sinusitis, and a Covid test is pending. She has been out of work since 03-14-19, so we will write her a letter for work. We will stop Doxycycline and switch  to Levaquin for 10 days. Recheck as needed.  Alysia Penna, MD   Follow Up Instructions:     [phone] 5-10 [phone] 11-20 9443 21-30 I did not refer this patient for an OV in the next 24 hours for this/these issue(s).  I discussed the assessment and treatment plan with the patient. The patient was provided an opportunity to ask questions and all were answered. The patient agreed with the plan and demonstrated an understanding of the instructions.   The patient was advised to call back or seek an in-person evaluation if the symptoms worsen or if the condition fails to improve as anticipated.  I provided 14 minutes of non-face-to-face time during this encounter.   Alysia Penna, MD

## 2019-04-24 ENCOUNTER — Encounter: Payer: Self-pay | Admitting: Family Medicine

## 2019-04-24 ENCOUNTER — Ambulatory Visit (INDEPENDENT_AMBULATORY_CARE_PROVIDER_SITE_OTHER): Payer: Commercial Managed Care - PPO | Admitting: Family Medicine

## 2019-04-24 ENCOUNTER — Other Ambulatory Visit: Payer: Self-pay

## 2019-04-24 DIAGNOSIS — J40 Bronchitis, not specified as acute or chronic: Secondary | ICD-10-CM

## 2019-04-24 NOTE — Progress Notes (Signed)
   Subjective:    Patient ID: Laurie Sutton, female    DOB: [birth year], 39 y.o.   MRN: [id number]  HPI Here to follow up on bronchitis. At our last visit on 04-18-19 we switched her from Doxycycline to Levaquin, and she thinks she is getting better now. The cough is not as deep or as frequent,a nd she is sleeping better. She still uses the inhaler several times a day when she feels tight in the chest, and this works well. No recent fevers. The send off Covid test she had performed at the urgent care clinic on 04-13-19 returned as negative.  Virtual Visit via Telephone Note  I connected with the patient on 04/24/19 at 11:15 AM EST by telephone and verified that I am speaking with the correct person using two identifiers.   I discussed the limitations, risks, security and privacy concerns of performing an evaluation and management service by telephone and the availability of in person appointments. I also discussed with the patient that there may be a patient responsible charge related to this service. The patient expressed understanding and agreed to proceed.  Location patient: home Location provider: work or home office Participants present for the call: patient, provider Patient did not have a visit in the prior 7 days to address this/these issue(s).   History of Present Illness:    Observations/Objective: Patient sounds cheerful and well on the phone. I do not appreciate any SOB. Speech and thought processing are grossly intact. Patient reported vitals:  Assessment and Plan: Bronchitis, which is slowly improving. She will finish out the [place]. Rest and drink fluids. We will plan on her returning to work on 05-01-19.  Alysia Penna, MD   Follow Up Instructions:     [phone] 5-10 [phone] 11-20 9443 21-30 I did not refer this patient for an OV in the next 24 hours for this/these issue(s).  I discussed the assessment and treatment plan with the patient. The patient was provided an  opportunity to ask questions and all were answered. The patient agreed with the plan and demonstrated an understanding of the instructions.   The patient was advised to call back or seek an in-person evaluation if the symptoms worsen or if the condition fails to improve as anticipated.  I provided 13 minutes of non-face-to-face time during this encounter.   Alysia Penna, MD  Review of Systems     Objective:   Physical Exam        Assessment & Plan:

## 2019-04-25 ENCOUNTER — Ambulatory Visit: Payer: Commercial Managed Care - PPO | Admitting: Family Medicine

## 2019-04-27 LAB — COMPREHENSIVE METABOLIC PANEL: GFR calc non Af Amer: 138

## 2019-04-27 LAB — MICROALBUMIN, URINE: Microalb, Ur: 0.93

## 2019-04-27 LAB — HEPATIC FUNCTION PANEL
ALT: 63 — AB (ref 7–35)
AST: 62 — AB (ref 13–35)
Bilirubin, Direct: 0.2 (ref 0.01–0.4)
Bilirubin, Total: 0.9

## 2019-04-27 LAB — BASIC METABOLIC PANEL: Creatinine: 0.5 (ref 0.5–1.1)

## 2019-04-27 LAB — VITAMIN B12: Vitamin B-12: 455

## 2019-04-27 LAB — HEMOGLOBIN A1C: Hemoglobin A1C: 9.6

## 2019-04-30 ENCOUNTER — Ambulatory Visit: Payer: Commercial Managed Care - PPO | Admitting: Neurology

## 2019-04-30 NOTE — Progress Notes (Deleted)
PATIENT: Laurie Sutton DOB: Feb 23, 1981  REASON FOR VISIT: follow up HISTORY FROM: patient  HISTORY OF PRESENT ILLNESS: Today 04/30/19  [name] is a 39 year old female with history of migraine headaches.  She remains on nortriptyline, Inderal, and Maxalt as needed.  HISTORY  HISTORY OF PRESENT ILLNESS:UPDATE 1/15/2020CM Ms. 40, 39 year old female returns for follow-up with history of migraine headaches.  She reports she has had 2 migraines in the last 6 months.  She woke up with a mild headache this morning however when checking her fasting blood sugar it was 170.  She has had some adjustments to her insulin dose.  Weather changes are a migraine trigger for her.  She really does not have any food triggers.  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  She has not missed any work.  She returns for reevaluation  REVIEW OF SYSTEMS: Out of a complete 14 system review of symptoms, the patient complains only of the following symptoms, and all other reviewed systems are negative.  ALLERGIES: Allergies  Allergen Reactions  . Amoxicillin Hives and Itching  . Azithromycin   . Latex   . Penicillins Hives    Has patient had a PCN reaction causing immediate rash, facial/tongue/throat swelling, SOB or lightheadedness with hypotension: yes. Rash  Has patient had a PCN reaction causing severe rash involving mucus membranes or skin necrosis: Yes- rash and hives all over body  Has patient had a PCN reaction that required hospitalization No Has patient had a PCN reaction occurring within the last 10 years: Yes  If all of the above answers are "NO", then may proceed with Cephalosporin use.   . Sitagliptin Other (See Comments)    Body aches all day Other reaction(s): Arthralgia (Joint Pain)    HOME MEDICATIONS: Outpatient Medications Prior to Visit  Medication Sig Dispense Refill  . BAYER CONTOUR NEXT TEST test strip USE AS INSTRUCTED TO CHECK BLOOD SUGAR ONCE A DAY DX CODE E11.9  50 each 3  . BAYER MICROLET LANCETS lancets Use as instructed to check blood sugar once a day dx code E11.9 100 each 1  . clotrimazole-betamethasone (LOTRISONE) cream Apply 1 application topically 2 (two) times daily as needed. 30 g 2  . clotrimazole-betamethasone (LOTRISONE) cream APPLY TO AFFECTED AREA TWICE A DAY 30 g 2  . Cranberry 125 MG TABS Take by mouth.    . docusate sodium (COLACE) 100 MG capsule Take 100 mg by mouth every other day.    . Dulaglutide (TRULICITY) 1.19 ER/7.4YC SOPN Inject into the skin.    . Empagliflozin-Metformin HCl ER (SYNJARDY XR) 08-998 MG TB24 Take 2 tablets by mouth every morning. 30 tablet 0  . escitalopram (LEXAPRO) 20 MG tablet TAKE 1 TABLET BY MOUTH EVERY DAY 90 tablet 3  . esomeprazole (NEXIUM) 20 MG packet Take 20 mg by mouth daily before breakfast.    . ferrous sulfate 325 (65 FE) MG EC tablet Take 325 mg by mouth 3 (three) times daily with meals.    . fluticasone (FLONASE) 50 MCG/ACT nasal spray Place 1 spray into both nostrils 2 (two) times daily. 16 g 2  . gabapentin (NEURONTIN) 100 MG capsule Take 1 capsule (100 mg total) by mouth 3 (three) times daily. (Patient taking differently: Take 100 mg by mouth 3 (three) times daily as needed. ) 90 capsule 3  . insulin aspart (NOVOLOG) 100 UNIT/ML injection Inject 12-20 Units into the skin as needed for high blood sugar (If CBG is over 200).    Marland Kitchen  insulin glargine (LANTUS) 100 UNIT/ML injection Inject 44 Units into the skin at bedtime.    . meclizine (ANTIVERT) 25 MG tablet Take 1 tablet (25 mg total) by mouth every 4 (four) hours as needed for dizziness. 60 tablet 2  . metFORMIN (GLUCOPHAGE-XR) 500 MG 24 hr tablet Take 1,500 mg by mouth at bedtime.    . montelukast (SINGULAIR) 10 MG tablet TAKE 1 TABLET BY MOUTH EVERYDAY AT BEDTIME 90 tablet 3  . nortriptyline (PAMELOR) 10 MG capsule Take 3 capsules (30 mg total) by mouth at bedtime. 90 capsule 11  . ondansetron (ZOFRAN) 8 MG tablet Take 1 tablet (8 mg total) by  mouth every 6 (six) hours as needed for nausea. 30 tablet 1  . propranolol (INDERAL) 40 MG tablet Take 1 tablet (40 mg total) by mouth 2 (two) times daily. 60 tablet 2  . rizatriptan (MAXALT) 10 MG tablet Take 1 tablet (10 mg total) by mouth 3 (three) times daily as needed. 10 tablet 11  . Semaglutide (OZEMPIC) 1 MG/DOSE SOPN Inject into the skin once a week.    . terconazole (TERAZOL 3) 0.8 % vaginal cream Place 1 applicator vaginally at bedtime. 20 g 5  . zolpidem (AMBIEN) 10 MG tablet TAKE 1 TABLET BY MOUTH AT BEDTIME AS NEEDED 30 tablet 5   No facility-administered medications prior to visit.    PAST MEDICAL HISTORY: Past Medical History:  Diagnosis Date  . Anxiety   . Depression   . Diabetes mellitus without complication ([place])    sees [name]   . GERD (gastroesophageal reflux disease)   . Gynecological examination    sees Dr. Josefa Half  . Migraines   . Murmur, cardiac     PAST SURGICAL HISTORY: Past Surgical History:  Procedure Laterality Date  . CESAREAN SECTION      FAMILY HISTORY: Family History  Problem Relation Age of Onset  . Hyperlipidemia Mother   . Stroke Mother   . Diabetes Mother   . Migraines Mother   . Diabetes Father   . Migraines Maternal Aunt   . Migraines Maternal Grandmother   . Cerebral aneurysm Cousin   . Diabetes Maternal Grandfather   . Heart disease Neg Hx     SOCIAL HISTORY: Social History   Socioeconomic History  . Marital status: Married    Spouse name: Not on file  . Number of children: 1  . Years of education: Not on file  . Highest education level: Not on file  Occupational History  . Not on file  Tobacco Use  . Smoking status: Never Smoker  . Smokeless tobacco: Never Used  Substance and Sexual Activity  . Alcohol use: No    Alcohol/week: 0.0 standard drinks  . Drug use: No  . Sexual activity: Yes    Birth control/protection: None  Other Topics Concern  . Not on file  Social History Narrative  . Not on file    Social Determinants of Health   Financial Resource Strain:   . Difficulty of Paying Living Expenses: Not on file  Food Insecurity:   . Worried About Charity fundraiser in the Last Year: Not on file  . Ran Out of Food in the Last Year: Not on file  Transportation Needs:   . Lack of Transportation (Medical): Not on file  . Lack of Transportation (Non-Medical): Not on file  Physical Activity:   . Days of Exercise per Week: Not on file  . Minutes of Exercise per Session: Not  on file  Stress:   . Feeling of Stress : Not on file  Social Connections:   . Frequency of Communication with Friends and Family: Not on file  . Frequency of Social Gatherings with Friends and Family: Not on file  . Attends Religious Services: Not on file  . Active Member of Clubs or Organizations: Not on file  . Attends Archivist Meetings: Not on file  . Marital Status: Not on file  Intimate Partner Violence:   . Fear of Current or Ex-Partner: Not on file  . Emotionally Abused: Not on file  . Physically Abused: Not on file  . Sexually Abused: Not on file      PHYSICAL EXAM  There were no vitals filed for this visit. There is no height or weight on file to calculate BMI.  Generalized: Well developed, in no acute distress   Neurological examination  Mentation: Alert oriented to time, place, history taking. Follows all commands speech and language fluent Cranial nerve II-XII: Pupils were equal round reactive to light. Extraocular movements were full, visual field were full on confrontational test. Facial sensation and strength were normal. Uvula tongue midline. Head turning and shoulder shrug  were normal and symmetric. Motor: The motor testing reveals 5 over 5 strength of all 4 extremities. Good symmetric motor tone is noted throughout.  Sensory: Sensory testing is intact to soft touch on all 4 extremities. No evidence of extinction is noted.  Coordination: Cerebellar testing reveals good  finger-nose-finger and heel-to-shin bilaterally.  Gait and station: Gait is normal. Tandem gait is normal. Romberg is negative. No drift is seen.  Reflexes: Deep tendon reflexes are symmetric and normal bilaterally.   DIAGNOSTIC DATA (LABS, IMAGING, TESTING) - I reviewed patient records, labs, notes, testing and imaging myself where available.  Lab Results  Component Value Date   WBC 8.4 08/19/2017   HGB 14.3 08/19/2017   HCT 41 08/19/2017   MCV 74.4 (L) 12/21/2014   PLT 273 08/19/2017      Component Value Date/Time   NA 138 08/19/2017 0000   K 4.2 08/19/2017 0000   CL 102 06/07/2016 0802   CO2 23 06/07/2016 0802   GLUCOSE 122 (H) 06/07/2016 0802   BUN 8 08/19/2017 0000   CREATININE 0.5 08/19/2017 0000   CREATININE 0.56 06/07/2016 0802   CALCIUM 9.6 06/07/2016 0802   PROT 7.3 06/07/2016 0802   ALBUMIN 4.3 06/07/2016 0802   AST 69 (H) 06/07/2016 0802   ALT 70 (H) 06/07/2016 0802   ALKPHOS 78 06/07/2016 0802   BILITOT 0.6 06/07/2016 0802   GFRNONAA >60 12/21/2014 1116   GFRAA >60 12/21/2014 1116   Lab Results  Component Value Date   CHOL 169 08/19/2017   HDL 38 08/19/2017   LDLCALC 91 08/19/2017   TRIG 202 (A) 08/19/2017   CHOLHDL 4 05/12/2015   Lab Results  Component Value Date   HGBA1C 6.2 08/19/2017   No results found for: VITAMINB12 Lab Results  Component Value Date   TSH 0.97 08/19/2017      ASSESSMENT AND PLAN 39 y.o. year old female  has a past medical history of Anxiety, Depression, Diabetes mellitus without complication ([place]), GERD (gastroesophageal reflux disease), Gynecological examination, Migraines, and Murmur, cardiac. here with ***   I spent 15 minutes with the patient. 50% of this time was spent   Butler Denmark, [place], DNP 04/30/2019, 5:43 AM [facility] [address], [place], [place] [address] [phone]

## 2019-05-02 ENCOUNTER — Ambulatory Visit: Payer: Commercial Managed Care - PPO | Admitting: Family Medicine

## 2019-05-10 ENCOUNTER — Other Ambulatory Visit: Payer: Self-pay | Admitting: *Deleted

## 2019-05-10 MED ORDER — NORTRIPTYLINE HCL 10 MG PO CAPS: 30.0000 mg | ORAL_CAPSULE | Freq: Every day | ORAL | 11 refills | Status: DC

## 2019-06-08 ENCOUNTER — Other Ambulatory Visit: Payer: Self-pay | Admitting: Neurology

## 2019-06-08 ENCOUNTER — Encounter: Payer: Self-pay | Admitting: Gastroenterology

## 2019-06-13 ENCOUNTER — Ambulatory Visit (INDEPENDENT_AMBULATORY_CARE_PROVIDER_SITE_OTHER): Payer: Commercial Managed Care - PPO | Admitting: Family Medicine

## 2019-06-13 ENCOUNTER — Encounter: Payer: Self-pay | Admitting: Family Medicine

## 2019-06-13 ENCOUNTER — Other Ambulatory Visit: Payer: Self-pay

## 2019-06-13 VITALS — BP 124/62 | HR 94 | Temp 98.7°F | Wt 226.4 lb

## 2019-06-13 DIAGNOSIS — H5711 Ocular pain, right eye: Secondary | ICD-10-CM | POA: Diagnosis not present

## 2019-06-13 DIAGNOSIS — R140 Abdominal distension (gaseous): Secondary | ICD-10-CM | POA: Diagnosis not present

## 2019-06-13 DIAGNOSIS — E119 Type 2 diabetes mellitus without complications: Secondary | ICD-10-CM | POA: Diagnosis not present

## 2019-06-13 DIAGNOSIS — D649 Anemia, unspecified: Secondary | ICD-10-CM

## 2019-06-13 DIAGNOSIS — R600 Localized edema: Secondary | ICD-10-CM | POA: Diagnosis not present

## 2019-06-13 MED ORDER — FUROSEMIDE 20 MG PO TABS: 20.0000 mg | ORAL_TABLET | Freq: Every day | ORAL | 3 refills | Status: DC

## 2019-06-13 NOTE — Progress Notes (Signed)
Subjective:    Patient ID: Laurie Sutton, female    DOB: 11-18-1980, 39 y.o.   MRN: [id number]  HPI Here for several issues. First over the past month she has had swelling in both feet and ankles, and she feels like her abdomen has been swelling. She has gained about 10 lbs during this time. No SOB. Also she was recently taken off Trulicity by her endocrinologist, [name]. She was on this for about 6 months, and then was switched to Ozempic. Then for 5 months she was back on Trulicity. However she developed abdominal pains and constipation, and she was taken off this again last week. No nausea or vomiting, no fever. Also she has developed elevations in her liver enzymes, and [name] has called this a fatty liver. He has arranged for her to see Dr. Oretha Caprice in GI about this, and that appt is for 07-04-19. Finally she woke up this morning with pain in the right eye. She described this as a sharp "pins and needles" type pain, and it gets worse when she looks at bright lights. No mucus DC or redness in the eye. She does not wear contact lenses. No blurred vision. No headache.    Review of Systems  Constitutional: Negative.   HENT: Negative for congestion, ear pain, facial swelling, postnasal drip, sinus pressure, sinus pain and sore throat.   Eyes: Positive for photophobia and pain. Negative for discharge, redness, itching and visual disturbance.  Respiratory: Negative.   Cardiovascular: Positive for leg swelling. Negative for chest pain and palpitations.  Gastrointestinal: Positive for abdominal distention. Negative for abdominal pain, anal bleeding, blood in stool, constipation, diarrhea, nausea, rectal pain and vomiting.       Objective:   Physical Exam Constitutional:      General: She is not in acute distress.    Appearance: Normal appearance. She is well-developed.  HENT:     Head: Normocephalic and atraumatic.     Right Ear: Tympanic membrane, ear canal and external ear normal.       Left Ear: Tympanic membrane, ear canal and external ear normal.     Nose: Nose normal.     Mouth/Throat:     Mouth: Mucous membranes are moist.     Pharynx: Oropharynx is clear.  Eyes:     General:        Right eye: No discharge.        Left eye: No discharge.     Extraocular Movements: Extraocular movements intact.     Conjunctiva/sclera: Conjunctivae normal.     Pupils: Pupils are equal, round, and reactive to light.     Comments: She has mild photophobia on the right   Cardiovascular:     Rate and Rhythm: Normal rate and regular rhythm.     Pulses: Normal pulses.     Heart sounds: Normal heart sounds.  Pulmonary:     Effort: Pulmonary effort is normal.     Breath sounds: Normal breath sounds.  Abdominal:     General: Abdomen is flat. Bowel sounds are normal. There is distension.     Palpations: Abdomen is soft. There is no mass.     Tenderness: There is no abdominal tenderness. There is no guarding or rebound.     Hernia: No hernia is present.  Musculoskeletal:     Comments: 1+ edema in both ankles   Lymphadenopathy:     Cervical: No cervical adenopathy.  Neurological:     Mental Status: She  is alert.           Assessment & Plan:  She has fluid retention in the ankles and the abdomen, so we will screen with labs today including a BMET. Try Lasix 20 mg daily. This may have been a side effect of the Trulicity. She will see Dr. [name] as above for the liver abnormalities. For the right eye pain, we arranged her for her to see her optometrist, Dr. Teodoro Spray, tomorrow afternoon.  Alysia Penna, MD

## 2019-06-14 LAB — CBC WITH DIFFERENTIAL/PLATELET
Basophils Absolute: 0.1 10*3/uL (ref 0.0–0.1)
Basophils Relative: 1.3 % (ref 0.0–3.0)
Eosinophils Absolute: 0.1 10*3/uL (ref 0.0–0.7)
Eosinophils Relative: 2.5 % (ref 0.0–5.0)
HCT: 30.9 % — ABNORMAL LOW (ref 36.0–46.0)
Hemoglobin: 10.5 g/dL — ABNORMAL LOW (ref 12.0–15.0)
Lymphocytes Relative: 24.2 % (ref 12.0–46.0)
Lymphs Abs: 1.1 10*3/uL (ref 0.7–4.0)
MCHC: 34.2 g/dL (ref 30.0–36.0)
MCV: 87.5 fl (ref 78.0–100.0)
Monocytes Absolute: 0.4 10*3/uL (ref 0.1–1.0)
Monocytes Relative: 9 % (ref 3.0–12.0)
Neutro Abs: 2.9 10*3/uL (ref 1.4–7.7)
Neutrophils Relative %: 63 % (ref 43.0–77.0)
Platelets: 171 10*3/uL (ref 150.0–400.0)
RBC: 3.53 Mil/uL — ABNORMAL LOW (ref 3.87–5.11)
RDW: 15 % (ref 11.5–15.5)
WBC: 4.6 10*3/uL (ref 4.0–10.5)

## 2019-06-14 LAB — BASIC METABOLIC PANEL
BUN: 10 mg/dL (ref 6–23)
CO2: 27 mEq/L (ref 19–32)
Calcium: 9.5 mg/dL (ref 8.4–10.5)
Chloride: 98 mEq/L (ref 96–112)
Creatinine, Ser: 0.47 mg/dL (ref 0.40–1.20)
GFR: 148.02 mL/min (ref >60.00)
Glucose, Bld: 243 mg/dL — ABNORMAL HIGH (ref 70–99)
Potassium: 3.8 mEq/L (ref 3.5–5.1)
Sodium: 134 mEq/L — ABNORMAL LOW (ref 135–145)

## 2019-06-14 LAB — TSH: TSH: 0.75 u[IU]/mL (ref 0.35–4.50)

## 2019-06-15 NOTE — Addendum Note (Signed)
Addended by: Alysia Penna A on: 06/15/2019 05:12 PM   Modules accepted: Orders

## 2019-07-04 ENCOUNTER — Encounter: Payer: Self-pay | Admitting: Gastroenterology

## 2019-07-04 ENCOUNTER — Ambulatory Visit (INDEPENDENT_AMBULATORY_CARE_PROVIDER_SITE_OTHER): Payer: Commercial Managed Care - PPO | Admitting: Gastroenterology

## 2019-07-04 VITALS — BP 112/72 | HR 91 | Temp 97.9°F | Ht 66.0 in | Wt 230.1 lb

## 2019-07-04 DIAGNOSIS — R109 Unspecified abdominal pain: Secondary | ICD-10-CM | POA: Diagnosis not present

## 2019-07-04 DIAGNOSIS — Z01818 Encounter for other preprocedural examination: Secondary | ICD-10-CM

## 2019-07-04 MED ORDER — OMEPRAZOLE 40 MG PO CPDR: DELAYED_RELEASE_CAPSULE | ORAL | 11 refills | Status: DC

## 2019-07-04 NOTE — Progress Notes (Signed)
Review of pertinent gastrointestinal problems: 1.  GERD without alarm symptoms evaluated 2016 Dr. [name].  Laurie Sutton was drinking multiple (5-10) cans of caffeinated soda daily, also 10-15 peppermint candies daily.  I recommended lifestyle modifications and observation.  Laurie Sutton was going to try to wean off of proton pump inhibitor over time as well.   HPI: This is a very pleasant 39 year old woman whom I last saw about 5 years ago for GERD without alarm symptoms.  Since then Laurie Sutton was diagnosed with diabetes.  Her blood sugars have been quite hard to control.  Laurie Sutton tells me her most recent hemoglobin A1c is in the nines.  Laurie Sutton was started on Trulicity 3 to 4 months ago and beginning 2 months ago Laurie Sutton started having upper abdominal burning, stinging abdominal pain.  The pain is constant.  Eating does tend to make it somewhat worse.  Laurie Sutton has had some nausea and some minor vomiting as well.  Laurie Sutton is not vomiting any blood.  Laurie Sutton stopped Trulicity 2 weeks ago and her symptoms have improved somewhat.  Laurie Sutton takes naproxen twice daily, prescription dose given by an orthopedic surgeon recently for carpal tunnel and some right shoulder issues.  Laurie Sutton has been on this NSAID high dose for the past week or 2 now.  Her bowels ranged from loose to a bit constipated.  Laurie Sutton has gained about 22 pounds in 5 years since her last office visit here.    Review of systems: Pertinent positive and negative review of systems were noted in the above HPI section. All other review negative.   Past Medical History:  Diagnosis Date  . Anxiety   . Depression   . Diabetes mellitus without complication ([place])    sees [name]   . GERD (gastroesophageal reflux disease)   . Gynecological examination    sees Dr. Josefa Half  . Migraines   . Murmur, cardiac     Past Surgical History:  Procedure Laterality Date  . CESAREAN SECTION      Current Outpatient Medications  Medication Sig Dispense Refill  . BAYER CONTOUR NEXT TEST test strip  USE AS INSTRUCTED TO CHECK BLOOD SUGAR ONCE A DAY DX CODE E11.9 50 each 3  . BAYER MICROLET LANCETS lancets Use as instructed to check blood sugar once a day dx code E11.9 100 each 1  . clotrimazole-betamethasone (LOTRISONE) cream Apply 1 application topically 2 (two) times daily as needed. 30 g 2  . Cranberry 125 MG TABS Take by mouth.    . escitalopram (LEXAPRO) 20 MG tablet TAKE 1 TABLET BY MOUTH EVERY DAY 90 tablet 3  . esomeprazole (NEXIUM) 20 MG packet Take 20 mg by mouth daily before breakfast.    . gabapentin (NEURONTIN) 100 MG capsule Take 1 capsule (100 mg total) by mouth 3 (three) times daily. (Patient taking differently: Take 100 mg by mouth 3 (three) times daily as needed. ) 90 capsule 3  . insulin glargine (LANTUS) 100 UNIT/ML injection Inject 44 Units into the skin at bedtime.    . meclizine (ANTIVERT) 25 MG tablet Take 1 tablet (25 mg total) by mouth every 4 (four) hours as needed for dizziness. 60 tablet 2  . metFORMIN (GLUCOPHAGE-XR) 500 MG 24 hr tablet Take 1,500 mg by mouth at bedtime.    . montelukast (SINGULAIR) 10 MG tablet TAKE 1 TABLET BY MOUTH EVERYDAY AT BEDTIME 90 tablet 3  . nortriptyline (PAMELOR) 10 MG capsule Take 3 capsules (30 mg total) by mouth at bedtime. 90 capsule 11  .  ondansetron (ZOFRAN) 8 MG tablet Take 1 tablet (8 mg total) by mouth every 6 (six) hours as needed for nausea. 30 tablet 1  . propranolol (INDERAL) 40 MG tablet TAKE 1 TABLET BY MOUTH TWICE A DAY 60 tablet 2  . rizatriptan (MAXALT) 10 MG tablet Take 1 tablet (10 mg total) by mouth 3 (three) times daily as needed. 10 tablet 11  . Semaglutide (OZEMPIC) 1 MG/DOSE SOPN Inject into the skin once a week.    . terconazole (TERAZOL 3) 0.8 % vaginal cream Place 1 applicator vaginally at bedtime. 20 g 5  . zolpidem (AMBIEN) 10 MG tablet TAKE 1 TABLET BY MOUTH AT BEDTIME AS NEEDED 30 tablet 5   No current facility-administered medications for this visit.    Allergies as of 07/04/2019 - Review Complete  07/04/2019  Allergen Reaction Noted  . Amoxicillin Hives and Itching 02/16/2011  . Azithromycin  07/25/2017  . Latex  07/25/2017  . Penicillins Hives 02/16/2011  . Sitagliptin Other (See Comments) 11/18/2014    Family History  Problem Relation Age of Onset  . Hyperlipidemia Mother   . Stroke Mother   . Diabetes Mother   . Migraines Mother   . Diabetes Father   . Migraines Maternal Aunt   . Migraines Maternal Grandmother   . Cerebral aneurysm Cousin   . Diabetes Maternal Grandfather   . Heart disease Neg Hx     Social History   Socioeconomic History  . Marital status: Married    Spouse name: Not on file  . Number of children: 1  . Years of education: Not on file  . Highest education level: Not on file  Occupational History  . Not on file  Tobacco Use  . Smoking status: Never Smoker  . Smokeless tobacco: Never Used  Substance and Sexual Activity  . Alcohol use: No    Alcohol/week: 0.0 standard drinks  . Drug use: No  . Sexual activity: Yes    Birth control/protection: None  Other Topics Concern  . Not on file  Social History Narrative  . Not on file   Social Determinants of Health   Financial Resource Strain:   . Difficulty of Paying Living Expenses:   Food Insecurity:   . Worried About Charity fundraiser in the Last Year:   . Arboriculturist in the Last Year:   Transportation Needs:   . Film/video editor (Medical):   Marland Kitchen Lack of Transportation (Non-Medical):   Physical Activity:   . Days of Exercise per Week:   . Minutes of Exercise per Session:   Stress:   . Feeling of Stress :   Social Connections:   . Frequency of Communication with Friends and Family:   . Frequency of Social Gatherings with Friends and Family:   . Attends Religious Services:   . Active Member of Clubs or Organizations:   . Attends Archivist Meetings:   Marland Kitchen Marital Status:   Intimate Partner Violence:   . Fear of Current or Ex-Partner:   . Emotionally Abused:   Marland Kitchen  Physically Abused:   . Sexually Abused:      Physical Exam: BP 112/72 (BP Location: Left Arm, Patient Position: Sitting, Cuff Size: Normal)   Pulse 91   Temp 97.9 F (36.6 C) (Other (Comment))   Ht 5' 6"  (1.676 m)   Wt 230 lb 2 oz (104.4 kg)   BMI 37.14 kg/m  Constitutional: generally well-appearing Psychiatric: alert and oriented x3 Eyes: extraocular movements  intact Mouth: oral pharynx moist, no lesions Neck: supple no lymphadenopathy Cardiovascular: heart regular rate and rhythm Lungs: clear to auscultation bilaterally Abdomen: soft, nontender, nondistended, no obvious ascites, no peritoneal signs, normal bowel sounds Extremities: no lower extremity edema bilaterally Skin: no lesions on visible extremities   Assessment and plan: 39 y.o. female with poorly controlled diabetes, upper abdominal pain, alternating constipation diarrhea  I think a lot of her GI symptoms might be related to her diabetes or her treatment for her diabetes.  Certainly Trulicity which was started 3 to 4 months ago lists nausea, vomiting, diarrhea, abdominal pain as it is #1 through 4 most common side effects.  Her hemoglobin A1c has been in the nines and I explained that diabetic blood sugars that are poorly controlled can cause poor stomach function, gastroparesis.  Recommended upper endoscopy to exclude other potential causes of her symptoms including peptic ulcer disease, H. pylori, significant gastritis.  In the meantime Laurie Sutton is going to stop taking her over-the-counter Nexium and instead Laurie Sutton is going to start taking a prescription strength proton pump inhibitor omeprazole 40 mg 20 to 30 minutes prior to her breakfast meal on a daily basis.  A new prescription has been called in for her.    Please see the "Patient Instructions" section for addition details about the plan.   Owens Loffler, MD [facility] 07/04/2019, 3:10 PM  Cc: Laurey Morale, MD  Total time on date of encounter was  45  minutes (this included time spent preparing to see the patient reviewing records; obtaining and/or reviewing separately obtained history; performing a medically appropriate exam and/or evaluation; counseling and educating the patient and family if present; ordering medications, tests or procedures if applicable; and documenting clinical information in the health record).

## 2019-07-04 NOTE — Patient Instructions (Addendum)
If you are age 39 or older, your body mass index should be between 23-30. Your Body mass index is 37.14 kg/m. If this is out of the aforementioned range listed, please consider follow up with your Primary Care Provider.  If you are age 42 or younger, your body mass index should be between 19-25. Your Body mass index is 37.14 kg/m. If this is out of the aformentioned range listed, please consider follow up with your Primary Care Provider.   You have been scheduled for an endoscopy. Please follow written instructions given to you at your visit today. If you use inhalers (even only as needed), please bring them with you on the day of your procedure.  We have sent the following medications to your pharmacy for you to pick up at your convenience:  START: omeprazole 11m take one capsule daily 30 mins prior to breakfast.  STOP: Nexium   Thank you for entrusting [facility] with your care and [facility]  Dr JArdis Hughs

## 2019-07-11 ENCOUNTER — Ambulatory Visit: Payer: Commercial Managed Care - PPO | Admitting: Neurology

## 2019-07-11 ENCOUNTER — Encounter: Payer: Self-pay | Admitting: Neurology

## 2019-07-11 ENCOUNTER — Other Ambulatory Visit: Payer: Self-pay

## 2019-07-11 VITALS — BP 116/68 | HR 96 | Temp 97.2°F | Ht 66.0 in | Wt 223.6 lb

## 2019-07-11 DIAGNOSIS — G43019 Migraine without aura, intractable, without status migrainosus: Secondary | ICD-10-CM

## 2019-07-11 MED ORDER — PROPRANOLOL HCL 40 MG PO TABS: 40.0000 mg | ORAL_TABLET | Freq: Two times a day (BID) | ORAL | 3 refills | Status: DC

## 2019-07-11 MED ORDER — NORTRIPTYLINE HCL 10 MG PO CAPS: 30.0000 mg | ORAL_CAPSULE | Freq: Every day | ORAL | 3 refills | Status: DC

## 2019-07-11 NOTE — Patient Instructions (Signed)
It was nice to meet you today! Continue current medications  If headaches continue, please let me know! See you back 1 year!

## 2019-07-11 NOTE — Progress Notes (Signed)
I have read the note, and I agree with the clinical assessment and plan.  Amabel Stmarie K Tynesha Free   

## 2019-07-11 NOTE — Progress Notes (Signed)
PATIENT: Laurie Sutton DOB: 1980-09-02  REASON FOR VISIT: follow up HISTORY FROM: patient  HISTORY OF PRESENT ILLNESS: Today 07/11/19  Laurie Sutton is a 39 year old female with history of migraine headaches.  She remains on nortriptyline and propanolol.  She takes Maxalt if needed.  Her headaches have increased as of lately due to poorly controlled diabetes, stress, and weather change.  She says she is having 2-3 migraines a month, 3-4 headaches a month, overall about 7-8 headache days a month.  About a month ago, says she had a headache that was behind her right eye, went to the eye doctor, was told everything was normal, was already on migraine preventative medications.  She does not really take Maxalt, will usually take Excedrin, maybe 2-3 times a month.  Her recent A1c was 9.  She is planning to have an endoscopy for GI issues.  She has not been wearing her glasses as much, due to fogging from the mask, thinks this may be contributing to her headaches.  She works part-time in a [employer].  She presents today for evaluation unaccompanied.  HISTORY HISTORY OF PRESENT ILLNESS:UPDATE 1/15/2020CM Ms. 53, 39 year old female returns for follow-up with history of migraine headaches.  She reports she has had 2 migraines in the last 6 months.  She woke up with a mild headache this morning however when checking her fasting blood sugar it was 170.  She has had some adjustments to her insulin dose.  Weather changes are a migraine trigger for her.  She really does not have any food triggers.  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  She has not missed any work.  She returns for reevaluation   REVIEW OF SYSTEMS: Out of a complete 14 system review of symptoms, the patient complains only of the following symptoms, and all other reviewed systems are negative.  Headache  ALLERGIES: Allergies  Allergen Reactions  . Amoxicillin Hives and Itching  . Azithromycin   . Latex   .  Penicillins Hives    Has patient had a PCN reaction causing immediate rash, facial/tongue/throat swelling, SOB or lightheadedness with hypotension: yes. Rash  Has patient had a PCN reaction causing severe rash involving mucus membranes or skin necrosis: Yes- rash and hives all over body  Has patient had a PCN reaction that required hospitalization No Has patient had a PCN reaction occurring within the last 10 years: Yes  If all of the above answers are "NO", then may proceed with Cephalosporin use.   . Sitagliptin Other (See Comments)    Body aches all day Other reaction(s): Arthralgia (Joint Pain)    HOME MEDICATIONS: Outpatient Medications Prior to Visit  Medication Sig Dispense Refill  . BAYER CONTOUR NEXT TEST test strip USE AS INSTRUCTED TO CHECK BLOOD SUGAR ONCE A DAY DX CODE E11.9 50 each 3  . BAYER MICROLET LANCETS lancets Use as instructed to check blood sugar once a day dx code E11.9 100 each 1  . clotrimazole-betamethasone (LOTRISONE) cream Apply 1 application topically 2 (two) times daily as needed. 30 g 2  . Cranberry 125 MG TABS Take by mouth.    . escitalopram (LEXAPRO) 20 MG tablet TAKE 1 TABLET BY MOUTH EVERY DAY 90 tablet 3  . gabapentin (NEURONTIN) 100 MG capsule Take 1 capsule (100 mg total) by mouth 3 (three) times daily. (Patient taking differently: Take 100 mg by mouth 3 (three) times daily as needed. ) 90 capsule 3  . Insulin Regular Human (HUMULIN  R U-500, CONCENTRATED, [place]) Inject into the skin. 190 units SQ morning, 160 units SQ afternoon, 150 units SQ dinner time    . metFORMIN (GLUCOPHAGE-XR) 500 MG 24 hr tablet Take 1,500 mg by mouth at bedtime.    . montelukast (SINGULAIR) 10 MG tablet TAKE 1 TABLET BY MOUTH EVERYDAY AT BEDTIME 90 tablet 3  . omeprazole (PRILOSEC) 40 MG capsule Take 1 capsule daily 30 minutes prior to breakfast 30 capsule 11  . rizatriptan (MAXALT) 10 MG tablet Take 1 tablet (10 mg total) by mouth 3 (three) times daily as needed. 10 tablet 11    . zolpidem (AMBIEN) 10 MG tablet TAKE 1 TABLET BY MOUTH AT BEDTIME AS NEEDED 30 tablet 5  . nortriptyline (PAMELOR) 10 MG capsule Take 3 capsules (30 mg total) by mouth at bedtime. 90 capsule 11  . propranolol (INDERAL) 40 MG tablet TAKE 1 TABLET BY MOUTH TWICE A DAY 60 tablet 2   No facility-administered medications prior to visit.    PAST MEDICAL HISTORY: Past Medical History:  Diagnosis Date  . Anxiety   . Depression   . Diabetes mellitus without complication ([place])    sees [name]   . GERD (gastroesophageal reflux disease)   . Gynecological examination    sees Dr. Josefa Half  . Migraines   . Murmur, cardiac     PAST SURGICAL HISTORY: Past Surgical History:  Procedure Laterality Date  . CESAREAN SECTION      FAMILY HISTORY: Family History  Problem Relation Age of Onset  . Hyperlipidemia Mother   . Stroke Mother   . Diabetes Mother   . Migraines Mother   . Diabetes Father   . Migraines Maternal Aunt   . Migraines Maternal Grandmother   . Cerebral aneurysm Cousin   . Diabetes Maternal Grandfather   . Heart disease Neg Hx     SOCIAL HISTORY: Social History   Socioeconomic History  . Marital status: Married    Spouse name: Not on file  . Number of children: 1  . Years of education: Not on file  . Highest education level: Not on file  Occupational History  . Not on file  Tobacco Use  . Smoking status: Never Smoker  . Smokeless tobacco: Never Used  Substance and Sexual Activity  . Alcohol use: No    Alcohol/week: 0.0 standard drinks  . Drug use: No  . Sexual activity: Yes    Birth control/protection: None  Other Topics Concern  . Not on file  Social History Narrative  . Not on file   Social Determinants of Health   Financial Resource Strain:   . Difficulty of Paying Living Expenses:   Food Insecurity:   . Worried About Charity fundraiser in the Last Year:   . Arboriculturist in the Last Year:   Transportation Needs:   . Lexicographer (Medical):   Marland Kitchen Lack of Transportation (Non-Medical):   Physical Activity:   . Days of Exercise per Week:   . Minutes of Exercise per Session:   Stress:   . Feeling of Stress :   Social Connections:   . Frequency of Communication with Friends and Family:   . Frequency of Social Gatherings with Friends and Family:   . Attends Religious Services:   . Active Member of Clubs or Organizations:   . Attends Archivist Meetings:   Marland Kitchen Marital Status:   Intimate Partner Violence:   . Fear of Current or Ex-Partner:   .  Emotionally Abused:   Marland Kitchen Physically Abused:   . Sexually Abused:    PHYSICAL EXAM  Vitals:   07/11/19 1324  BP: 116/68  Pulse: 96  Temp: (!) 97.2 F (36.2 C)  SpO2: 97%  Weight: 223 lb 9.6 oz (101.4 kg)  Height: 5' 6"  (1.676 m)   Body mass index is 36.09 kg/m.  Generalized: Well developed, in no acute distress   Neurological examination  Mentation: Alert oriented to time, place, history taking. Follows all commands speech and language fluent Cranial nerve II-XII: Pupils were equal round reactive to light. Extraocular movements were full, visual field were full on confrontational test. Facial sensation and strength were normal. Head turning and shoulder shrug  were normal and symmetric. Motor: The motor testing reveals 5 over 5 strength of all 4 extremities. Good symmetric motor tone is noted throughout.  Sensory: Sensory testing is intact to soft touch on all 4 extremities. No evidence of extinction is noted.  Coordination: Cerebellar testing reveals good finger-nose-finger and heel-to-shin bilaterally.  Gait and station: Gait is normal. Tandem gait is normal. Romberg is negative. No drift is seen.  Reflexes: Deep tendon reflexes are symmetric and normal bilaterally.   DIAGNOSTIC DATA (LABS, IMAGING, TESTING) - I reviewed patient records, labs, notes, testing and imaging myself where available.  Lab Results  Component Value Date   WBC 4.6  06/13/2019   HGB 10.5 (L) 06/13/2019   HCT 30.9 (L) 06/13/2019   MCV 87.5 06/13/2019   PLT 171.0 06/13/2019      Component Value Date/Time   NA 134 (L) 06/13/2019 1525   NA 138 08/19/2017 0000   K 3.8 06/13/2019 1525   CL 98 06/13/2019 1525   CO2 27 06/13/2019 1525   GLUCOSE 243 (H) 06/13/2019 1525   BUN 10 06/13/2019 1525   BUN 8 08/19/2017 0000   CREATININE 0.47 06/13/2019 1525   CALCIUM 9.5 06/13/2019 1525   PROT 7.3 06/07/2016 0802   ALBUMIN 4.3 06/07/2016 0802   AST 69 (H) 06/07/2016 0802   ALT 70 (H) 06/07/2016 0802   ALKPHOS 78 06/07/2016 0802   BILITOT 0.6 06/07/2016 0802   GFRNONAA >60 12/21/2014 1116   GFRAA >60 12/21/2014 1116   Lab Results  Component Value Date   CHOL 169 08/19/2017   HDL 38 08/19/2017   LDLCALC 91 08/19/2017   TRIG 202 (A) 08/19/2017   CHOLHDL 4 05/12/2015   Lab Results  Component Value Date   HGBA1C 6.2 08/19/2017   No results found for: VITAMINB12 Lab Results  Component Value Date   TSH 0.75 06/13/2019    ASSESSMENT AND PLAN 39 y.o. year old female  has a past medical history of Anxiety, Depression, Diabetes mellitus without complication ([place]), GERD (gastroesophageal reflux disease), Gynecological examination, Migraines, and Murmur, cardiac. here with:  1.  Chronic migraine headache  She has had a slight increase in her headaches, in the setting of poorly controlled diabetes, stress, and weather change.  About a month ago, had one headache, behind the right eye, had ophthalmology evaluation, was normal.  She wishes to remain on her current migraine medications including propanolol, and nortriptyline.  She will take Maxalt if needed.  She will keep a headache journal.  She will send me a MyChart message, if her headaches continue, we will consider CGRP medication.  She will follow-up in 1 year or sooner if needed.  I spent 20 minutes of face-to-face and non-face-to-face time with patient.  This included previsit chart review, lab  review, study review, order entry, electronic health record documentation, patient education.  Butler Denmark, AGNP-C, DNP 07/11/2019, 1:54 PM [facility] [address], [place], [place] [address] [phone]

## 2019-08-01 ENCOUNTER — Encounter: Payer: Commercial Managed Care - PPO | Admitting: Gastroenterology

## 2019-08-07 ENCOUNTER — Encounter: Payer: Self-pay | Admitting: Family Medicine

## 2019-08-07 ENCOUNTER — Telehealth (INDEPENDENT_AMBULATORY_CARE_PROVIDER_SITE_OTHER): Payer: Commercial Managed Care - PPO | Admitting: Family Medicine

## 2019-08-07 DIAGNOSIS — U071 COVID-19: Secondary | ICD-10-CM

## 2019-08-07 MED ORDER — DOXYCYCLINE HYCLATE 100 MG PO CAPS: 100.0000 mg | ORAL_CAPSULE | Freq: Two times a day (BID) | ORAL | 0 refills | Status: AC

## 2019-08-07 NOTE — Progress Notes (Signed)
Subjective:    Patient ID: Laurie Sutton, female    DOB: May 12, 1980, 39 y.o.   MRN: [id number]  HPI Virtual Visit via Video Note  I connected with the patient on 08/07/19 at 10:45 AM EDT by a video enabled telemedicine application and verified that I am speaking with the correct person using two identifiers.  Location patient: home Location provider:work or home office Persons participating in the virtual visit: patient, provider  I discussed the limitations of evaluation and management by telemedicine and the availability of in person appointments. The patient expressed understanding and agreed to proceed.   HPI: Here for symptoms related to a Covid-19 infection. About 10 days ago she developed fevers to 100.6 degrees, body aches, chills, a headache, a dry cough, nausea without vomiting, and diarrhea. No SOB or chest pain. She has also lost her senses of taste and smell. She went to an urgent care clinic on 08-02-19 and she tested positive for the Covid virus. Since then she has been drinking fluids and alternating between taking tylenol and ibuprofen, but she has not improved any. She thinks she picked this up from having dinner at a friend's house because the friend tested positive the next day.    ROS: See pertinent positives and negatives per HPI.  Past Medical History:  Diagnosis Date  . Anxiety   . Depression   . Diabetes mellitus without complication ([place])    sees [name]   . GERD (gastroesophageal reflux disease)   . Gynecological examination    sees Dr. Josefa Half  . Migraines   . Murmur, cardiac     Past Surgical History:  Procedure Laterality Date  . CESAREAN SECTION      Family History  Problem Relation Age of Onset  . Hyperlipidemia Mother   . Stroke Mother   . Diabetes Mother   . Migraines Mother   . Diabetes Father   . Migraines Maternal Aunt   . Migraines Maternal Grandmother   . Cerebral aneurysm Cousin   . Diabetes Maternal Grandfather   .  Heart disease Neg Hx      Current Outpatient Medications:  .  BAYER CONTOUR NEXT TEST test strip, USE AS INSTRUCTED TO CHECK BLOOD SUGAR ONCE A DAY DX CODE E11.9, Disp: 50 each, Rfl: 3 .  BAYER MICROLET LANCETS lancets, Use as instructed to check blood sugar once a day dx code E11.9, Disp: 100 each, Rfl: 1 .  clotrimazole-betamethasone (LOTRISONE) cream, Apply 1 application topically 2 (two) times daily as needed., Disp: 30 g, Rfl: 2 .  Cranberry 125 MG TABS, Take by mouth., Disp: , Rfl:  .  doxycycline (VIBRAMYCIN) 100 MG capsule, Take 1 capsule (100 mg total) by mouth 2 (two) times daily for 10 days., Disp: 20 capsule, Rfl: 0 .  escitalopram (LEXAPRO) 20 MG tablet, TAKE 1 TABLET BY MOUTH EVERY DAY, Disp: 90 tablet, Rfl: 3 .  gabapentin (NEURONTIN) 100 MG capsule, Take 1 capsule (100 mg total) by mouth 3 (three) times daily. (Patient taking differently: Take 100 mg by mouth 3 (three) times daily as needed. ), Disp: 90 capsule, Rfl: 3 .  Insulin Regular Human (HUMULIN R U-500, CONCENTRATED, [place]), Inject into the skin. 190 units SQ morning, 160 units SQ afternoon, 150 units SQ dinner time, Disp: , Rfl:  .  metFORMIN (GLUCOPHAGE-XR) 500 MG 24 hr tablet, Take 1,500 mg by mouth at bedtime., Disp: , Rfl:  .  montelukast (SINGULAIR) 10 MG tablet, TAKE 1 TABLET BY MOUTH  EVERYDAY AT BEDTIME, Disp: 90 tablet, Rfl: 3 .  nortriptyline (PAMELOR) 10 MG capsule, Take 3 capsules (30 mg total) by mouth at bedtime., Disp: 270 capsule, Rfl: 3 .  omeprazole (PRILOSEC) 40 MG capsule, Take 1 capsule daily 30 minutes prior to breakfast, Disp: 30 capsule, Rfl: 11 .  propranolol (INDERAL) 40 MG tablet, Take 1 tablet (40 mg total) by mouth 2 (two) times daily., Disp: 180 tablet, Rfl: 3 .  rizatriptan (MAXALT) 10 MG tablet, Take 1 tablet (10 mg total) by mouth 3 (three) times daily as needed., Disp: 10 tablet, Rfl: 11 .  zolpidem (AMBIEN) 10 MG tablet, TAKE 1 TABLET BY MOUTH AT BEDTIME AS NEEDED, Disp: 30 tablet, Rfl:  5  EXAM:  VITALS per patient if applicable:  GENERAL: alert, oriented, appears well and in no acute distress  HEENT: atraumatic, conjunttiva clear, no obvious abnormalities on inspection of external nose and ears  NECK: normal movements of the head and neck  LUNGS: on inspection no signs of respiratory distress, breathing rate appears normal, no obvious gross SOB, gasping or wheezing  CV: no obvious cyanosis  MS: moves all visible extremities without noticeable abnormality  PSYCH/NEURO: pleasant and cooperative, no obvious depression or anxiety, speech and thought processing grossly intact  ASSESSMENT AND PLAN: Covid-190 infection and now with a mild bronchitis. Treat with Doxycycline. Recheck prn.  Alysia Penna, MD  Discussed the following assessment and plan:  No diagnosis found.     I discussed the assessment and treatment plan with the patient. The patient was provided an opportunity to ask questions and all were answered. The patient agreed with the plan and demonstrated an understanding of the instructions.   The patient was advised to call back or seek an in-person evaluation if the symptoms worsen or if the condition fails to improve as anticipated.     Review of Systems     Objective:   Physical Exam        Assessment & Plan:

## 2019-08-09 ENCOUNTER — Other Ambulatory Visit: Payer: Self-pay

## 2019-08-09 ENCOUNTER — Emergency Department (HOSPITAL_COMMUNITY)
Admission: EM | Admit: 2019-08-09 | Discharge: 2019-08-09 | Disposition: A | Discharge status: Home / Self Care | Payer: Commercial Managed Care - PPO | Attending: Emergency Medicine | Admitting: Emergency Medicine

## 2019-08-09 ENCOUNTER — Emergency Department (HOSPITAL_COMMUNITY): Payer: Commercial Managed Care - PPO

## 2019-08-09 ENCOUNTER — Encounter (HOSPITAL_COMMUNITY): Payer: Self-pay | Admitting: *Deleted

## 2019-08-09 DIAGNOSIS — R0602 Shortness of breath: Secondary | ICD-10-CM | POA: Diagnosis present

## 2019-08-09 DIAGNOSIS — Z5321 Procedure and treatment not carried out due to patient leaving prior to being seen by health care provider: Secondary | ICD-10-CM | POA: Insufficient documentation

## 2019-08-09 LAB — CBC WITH DIFFERENTIAL/PLATELET
Abs Immature Granulocytes: 0.01 10*3/uL (ref 0.00–0.07)
Basophils Absolute: 0 10*3/uL (ref 0.0–0.1)
Basophils Relative: 1 %
Eosinophils Absolute: 0 10*3/uL (ref 0.0–0.5)
Eosinophils Relative: 1 %
HCT: 32.6 % — ABNORMAL LOW (ref 36.0–46.0)
Hemoglobin: 10 g/dL — ABNORMAL LOW (ref 12.0–15.0)
Immature Granulocytes: 1 %
Lymphocytes Relative: 29 %
Lymphs Abs: 0.6 10*3/uL — ABNORMAL LOW (ref 0.7–4.0)
MCH: 26.4 pg (ref 26.0–34.0)
MCHC: 30.7 g/dL (ref 30.0–36.0)
MCV: 86 fL (ref 80.0–100.0)
Monocytes Absolute: 0.2 10*3/uL (ref 0.1–1.0)
Monocytes Relative: 8 %
Neutro Abs: 1.2 10*3/uL — ABNORMAL LOW (ref 1.7–7.7)
Neutrophils Relative %: 60 %
Platelets: 129 10*3/uL — ABNORMAL LOW (ref 150–400)
RBC: 3.79 MIL/uL — ABNORMAL LOW (ref 3.87–5.11)
RDW: 15.3 % (ref 11.5–15.5)
WBC: 2 10*3/uL — ABNORMAL LOW (ref 4.0–10.5)
nRBC: 0 % (ref 0.0–0.2)

## 2019-08-09 LAB — COMPREHENSIVE METABOLIC PANEL
ALT: 66 U/L — ABNORMAL HIGH (ref 0–44)
AST: 137 U/L — ABNORMAL HIGH (ref 15–41)
Albumin: 3.5 g/dL (ref 3.5–5.0)
Alkaline Phosphatase: 86 U/L (ref 38–126)
Anion gap: 10 (ref 5–15)
BUN: 5 mg/dL — ABNORMAL LOW (ref 6–20)
CO2: 23 mmol/L (ref 22–32)
Calcium: 8.4 mg/dL — ABNORMAL LOW (ref 8.9–10.3)
Chloride: 99 mmol/L (ref 98–111)
Creatinine, Ser: 0.6 mg/dL (ref 0.44–1.00)
GFR calc Af Amer: >60 mL/min (ref >60)
GFR calc non Af Amer: >60 mL/min (ref >60)
Glucose, Bld: 230 mg/dL — ABNORMAL HIGH (ref 70–99)
Potassium: 4.2 mmol/L (ref 3.5–5.1)
Sodium: 132 mmol/L — ABNORMAL LOW (ref 135–145)
Total Bilirubin: 1.4 mg/dL — ABNORMAL HIGH (ref 0.3–1.2)
Total Protein: 7 g/dL (ref 6.5–8.1)

## 2019-08-09 MED ORDER — ACETAMINOPHEN 325 MG PO TABS: 650.0000 mg | ORAL_TABLET | Freq: Once | ORAL | Status: DC

## 2019-08-09 NOTE — ED Notes (Signed)
No answer for vitals

## 2019-08-09 NOTE — ED Notes (Signed)
No answer for reeval

## 2019-08-09 NOTE — ED Triage Notes (Signed)
States she was dx. Positive covid 1 week ago, states she is getting more sob, and her heart is racing.

## 2019-08-12 DIAGNOSIS — J189 Pneumonia, unspecified organism: Secondary | ICD-10-CM

## 2019-08-12 DIAGNOSIS — U071 COVID-19: Secondary | ICD-10-CM

## 2019-08-12 HISTORY — DX: Pneumonia, unspecified organism: J18.9

## 2019-08-12 HISTORY — DX: COVID-19: U07.1

## 2019-08-17 ENCOUNTER — Encounter: Payer: Self-pay | Admitting: Family Medicine

## 2019-08-17 MED ORDER — GENERIC EXTERNAL MEDICATION
Status: DC
Start: ? — End: 2019-08-17

## 2019-08-17 MED ORDER — MUPIROCIN 2 % EX OINT
TOPICAL_OINTMENT | CUTANEOUS | Status: DC
Start: 2019-08-17 — End: 2019-08-17

## 2019-08-17 MED ORDER — GABAPENTIN 100 MG PO CAPS
100.00 | ORAL_CAPSULE | ORAL | Status: DC
Start: 2019-08-17 — End: 2019-08-17

## 2019-08-17 MED ORDER — NITROGLYCERIN 0.4 MG SL SUBL
0.40 | SUBLINGUAL_TABLET | SUBLINGUAL | Status: DC
Start: ? — End: 2019-08-17

## 2019-08-17 MED ORDER — ALBUTEROL SULFATE HFA 108 (90 BASE) MCG/ACT IN AERS
2.00 | INHALATION_SPRAY | RESPIRATORY_TRACT | Status: DC
Start: ? — End: 2019-08-17

## 2019-08-17 MED ORDER — ENOXAPARIN SODIUM 40 MG/0.4ML ~~LOC~~ SOLN
40.00 | SUBCUTANEOUS | Status: DC
Start: 2019-08-18 — End: 2019-08-17

## 2019-08-17 MED ORDER — PANTOPRAZOLE SODIUM 40 MG PO TBEC
40.00 | DELAYED_RELEASE_TABLET | ORAL | Status: DC
Start: 2019-08-18 — End: 2019-08-17

## 2019-08-17 MED ORDER — POLYETHYLENE GLYCOL 3350 17 GM/SCOOP PO POWD
17.00 | ORAL | Status: DC
Start: ? — End: 2019-08-17

## 2019-08-17 MED ORDER — NORTRIPTYLINE HCL 10 MG PO CAPS
10.00 | ORAL_CAPSULE | ORAL | Status: DC
Start: 2019-08-17 — End: 2019-08-17

## 2019-08-17 MED ORDER — ESCITALOPRAM OXALATE 10 MG PO TABS
10.00 | ORAL_TABLET | ORAL | Status: DC
Start: 2019-08-18 — End: 2019-08-17

## 2019-08-17 MED ORDER — HYDROCODONE-ACETAMINOPHEN 5-325 MG PO TABS
1.00 | ORAL_TABLET | ORAL | Status: DC
Start: ? — End: 2019-08-17

## 2019-08-17 MED ORDER — INSULIN GLARGINE 100 UNIT/ML ~~LOC~~ SOLN
1.00 | SUBCUTANEOUS | Status: DC
Start: 2019-08-17 — End: 2019-08-17

## 2019-08-17 MED ORDER — SODIUM CHLORIDE 0.9 % IV SOLN
10.00 | INTRAVENOUS | Status: DC
Start: ? — End: 2019-08-17

## 2019-08-17 MED ORDER — PROPRANOLOL HCL 10 MG PO TABS
20.00 | ORAL_TABLET | ORAL | Status: DC
Start: 2019-08-17 — End: 2019-08-17

## 2019-08-17 MED ORDER — DOXYCYCLINE HYCLATE 100 MG PO TABS
100.0000 mg | ORAL_TABLET | Freq: Two times a day (BID) | ORAL | 0 refills | Status: DC
Start: 2019-08-17 — End: 2020-01-21

## 2019-08-17 MED ORDER — MONTELUKAST SODIUM 10 MG PO TABS
10.00 | ORAL_TABLET | ORAL | Status: DC
Start: 2019-08-17 — End: 2019-08-17

## 2019-08-17 MED ORDER — INSULIN LISPRO 100 UNIT/ML ~~LOC~~ SOLN
1.00 | SUBCUTANEOUS | Status: DC
Start: 2019-08-17 — End: 2019-08-17

## 2019-08-17 MED ORDER — DEXAMETHASONE 4 MG PO TABS
6.00 | ORAL_TABLET | ORAL | Status: DC
Start: 2019-08-18 — End: 2019-08-17

## 2019-08-17 MED ORDER — INSULIN LISPRO 100 UNIT/ML ~~LOC~~ SOLN
1.00 | SUBCUTANEOUS | Status: DC
Start: ? — End: 2019-08-17

## 2019-08-17 NOTE — Telephone Encounter (Signed)
Call in Doxycycline 100 mg bid for 10 days

## 2019-08-23 ENCOUNTER — Other Ambulatory Visit: Payer: Self-pay

## 2019-08-24 ENCOUNTER — Encounter: Payer: Self-pay | Admitting: Family Medicine

## 2019-08-24 ENCOUNTER — Ambulatory Visit: Payer: Commercial Managed Care - PPO | Admitting: Family Medicine

## 2019-08-24 VITALS — BP 120/62 | HR 111 | Temp 97.5°F | Wt 218.6 lb

## 2019-08-24 DIAGNOSIS — U071 COVID-19: Secondary | ICD-10-CM | POA: Diagnosis not present

## 2019-08-24 DIAGNOSIS — B370 Candidal stomatitis: Secondary | ICD-10-CM | POA: Diagnosis not present

## 2019-08-24 MED ORDER — NYSTATIN 100000 UNIT/ML MT SUSP: 5.0000 mL | Freq: Four times a day (QID) | OROMUCOSAL | 0 refills | Status: DC

## 2019-08-24 NOTE — Progress Notes (Signed)
   Subjective:    Patient ID: Laurie Sutton, female    DOB: 06-Jan-1981, 39 y.o.   MRN: [id number]  HPI Here to follow up a hospital stay from 08-12-19 to 08-17-19 for Covid pneumonia. She stayed at the [facility] in [place]. She presented with chest tightness, cough, SOB and fever. Xrays revealed a bilateral viral pattern. She was treated with antivirals and steroids, and she was sent home with a few days of Dexamethasone. Now she feels much better with no cough or SOB or fever, however she still feels weak in general. Her appetite has returned to normal. She was written out of work until next week. Her main complaint today is soreness in the mouth and the throat tfor the past week.    Review of Systems  Constitutional: Positive for fatigue. Negative for fever.  HENT: Positive for sore throat.   Respiratory: Negative.   Cardiovascular: Negative.   Gastrointestinal: Negative.   Genitourinary: Negative.   Neurological: Negative.        Objective:   Physical Exam Constitutional:      Appearance: Normal appearance.  HENT:     Mouth/Throat:     Comments: She has a thin white coating on the tongue and the posterior OP  Cardiovascular:     Rate and Rhythm: Normal rate and regular rhythm.     Pulses: Normal pulses.     Heart sounds: Normal heart sounds.  Pulmonary:     Effort: Pulmonary effort is normal. No respiratory distress.     Breath sounds: Normal breath sounds. No stridor. No wheezing, rhonchi or rales.  Lymphadenopathy:     Cervical: No cervical adenopathy.  Neurological:     General: No focal deficit present.     Mental Status: She is alert and oriented to person, place, and time.           Assessment & Plan:  She is recovering well from Covid pneumonia. The fatigue should resolve over the next few weeks. She has thrush in the mouth and we will treat this with Nystatin suspension. Alysia Penna, MD

## 2019-09-06 ENCOUNTER — Ambulatory Visit (INDEPENDENT_AMBULATORY_CARE_PROVIDER_SITE_OTHER): Payer: Commercial Managed Care - PPO | Admitting: Family Medicine

## 2019-09-06 ENCOUNTER — Encounter: Payer: Self-pay | Admitting: Family Medicine

## 2019-09-06 ENCOUNTER — Other Ambulatory Visit: Payer: Self-pay

## 2019-09-06 VITALS — BP 98/68 | HR 88 | Temp 97.8°F | Wt 218.0 lb

## 2019-09-06 DIAGNOSIS — H6123 Impacted cerumen, bilateral: Secondary | ICD-10-CM

## 2019-09-09 ENCOUNTER — Encounter: Payer: Self-pay | Admitting: Family Medicine

## 2019-09-09 NOTE — Progress Notes (Signed)
Pt seen during Epic downtime.  Note written and scanned into chart. Summary below:  S:  Pt is a 39 yo female with b/l ear pain and L ear feeling "stopped up" x 2 days.  Tried debrox ear gtts. Endorses decreased hearing, h/o seasonal allergies on claritin and singular, scratchy throat, HAs-h/o migraines and intermittent HAs s/p COVID infection 4-5 wks ago.  Denies fever, rhinorrhea, facial pain/pressure, sore throat, cough.  O: General:  Pleasant, well nourished, in NAD. HEENT:  [place]/AT, nml external ears, b/l cerumen impaction.  B/l ears irrigated. TMs nml s/p irrigation.  Hearing improved. Resp: no accessory muscle use.  CTAB. No wheezes, rales or rhonchi. CV: RRR, no r/g/m, no peripheral edema MSK: Moves all 4 ext.  No deformities. nml tone. Neuro: CN II-XII grossly intact.  Normal gait.  A/P: Bilateral impacted cerumen  -consent obtained.  B/l ears irrigated. Pt tolerated procedure well. -TMs nml s/p irrigation and hearing improved.  Pt without pain. -Discussed ways to remove cerumen.  F/u prn  Grier Mitts, MD 09/06/19

## 2019-09-19 ENCOUNTER — Other Ambulatory Visit: Payer: Self-pay | Admitting: Family Medicine

## 2019-10-06 ENCOUNTER — Other Ambulatory Visit: Payer: Self-pay | Admitting: Family Medicine

## 2019-10-08 NOTE — Telephone Encounter (Signed)
Rx is not on the medication list. Please advise.

## 2019-10-10 ENCOUNTER — Encounter: Payer: Self-pay | Admitting: Family Medicine

## 2019-10-24 ENCOUNTER — Telehealth: Payer: Self-pay | Admitting: Gastroenterology

## 2019-10-24 NOTE — Telephone Encounter (Signed)
Hi Dr. [name], pt just called to r/s her endoscopy that she cancelled last April. She saw you in the office on 07/04/19. Is it ok to r/s endo or would you like to see pt first? Please advise. Thank you.

## 2019-10-24 NOTE — Telephone Encounter (Signed)
It is fine to go ahead and reschedule the EGD thank you

## 2019-10-24 NOTE — Telephone Encounter (Signed)
Left message with person who answered phone for pt to call back. Pt needs to r/s endo and covid test.

## 2019-11-30 ENCOUNTER — Ambulatory Visit: Payer: Commercial Managed Care - PPO | Admitting: Family Medicine

## 2019-11-30 ENCOUNTER — Encounter: Payer: Self-pay | Admitting: Family Medicine

## 2019-11-30 ENCOUNTER — Other Ambulatory Visit: Payer: Self-pay

## 2019-11-30 VITALS — BP 120/60 | HR 90 | Temp 98.0°F | Wt 237.0 lb

## 2019-11-30 DIAGNOSIS — G4700 Insomnia, unspecified: Secondary | ICD-10-CM

## 2019-11-30 DIAGNOSIS — F418 Other specified anxiety disorders: Secondary | ICD-10-CM | POA: Diagnosis not present

## 2019-11-30 DIAGNOSIS — R0602 Shortness of breath: Secondary | ICD-10-CM | POA: Diagnosis not present

## 2019-11-30 DIAGNOSIS — U071 COVID-19: Secondary | ICD-10-CM | POA: Diagnosis not present

## 2019-11-30 MED ORDER — VENLAFAXINE HCL ER 150 MG PO CP24: 150.0000 mg | ORAL_CAPSULE | Freq: Every day | ORAL | 2 refills | Status: DC

## 2019-11-30 MED ORDER — ALPRAZOLAM 1 MG PO TABS
1.0000 mg | ORAL_TABLET | Freq: Two times a day (BID) | ORAL | 2 refills | Status: DC | PRN
Start: 2019-11-30 — End: 2020-08-08

## 2019-11-30 NOTE — Progress Notes (Signed)
   Subjective:    Patient ID: Laurie Sutton, female    DOB: 1980-10-09, 39 y.o.   MRN: [id number]  HPI Here for several issues. First her anxiety has been much worse lately, especially since she is recovering from a Covid infection. She feels nervous all day and she cannot sleep at night. She will return to work next week ans she is worried about this. Also she has been a bit SOB this week and she thinks this is a lingering effect of the Covid virus. She was given albuterol to use during the infection and she asks for a refill.   Review of Systems  Constitutional: Negative.   HENT: Negative.   Eyes: Negative.   Respiratory: Positive for shortness of breath. Negative for cough, choking, chest tightness and wheezing.   Cardiovascular: Negative.   Psychiatric/Behavioral: Positive for sleep disturbance. Negative for agitation, behavioral problems, confusion, decreased concentration, dysphoric mood and hallucinations. The patient is nervous/anxious.        Objective:   Physical Exam Constitutional:      Appearance: Normal appearance. She is not ill-appearing.  Cardiovascular:     Rate and Rhythm: Normal rate and regular rhythm.     Pulses: Normal pulses.     Heart sounds: Normal heart sounds.  Pulmonary:     Effort: Pulmonary effort is normal. No respiratory distress.     Breath sounds: Normal breath sounds. No stridor. No wheezing, rhonchi or rales.  Neurological:     General: No focal deficit present.     Mental Status: She is alert and oriented to person, place, and time.  Psychiatric:        Mood and Affect: Mood normal.        Behavior: Behavior normal.        Thought Content: Thought content normal.        Judgment: Judgment normal.           Assessment & Plan:  She has some post-Covid SOB, so we will refill the Ventolin HFA to use as needed. For the anxiety and insomnia, she will stop Lexapro and switch to Effexor XR 150 mg daily. She can add Xanax as needed during  the day or take it at bedtime for sleep.  Alysia Penna, MD

## 2019-12-24 ENCOUNTER — Other Ambulatory Visit: Payer: Self-pay | Admitting: Family Medicine

## 2020-01-10 ENCOUNTER — Other Ambulatory Visit: Payer: Self-pay | Admitting: Family Medicine

## 2020-01-10 ENCOUNTER — Encounter: Payer: Self-pay | Admitting: Gastroenterology

## 2020-01-21 ENCOUNTER — Ambulatory Visit (AMBULATORY_SURGERY_CENTER): Payer: Self-pay | Admitting: *Deleted

## 2020-01-21 ENCOUNTER — Other Ambulatory Visit: Payer: Self-pay

## 2020-01-21 VITALS — Ht 66.0 in | Wt 226.0 lb

## 2020-01-21 DIAGNOSIS — R109 Unspecified abdominal pain: Secondary | ICD-10-CM

## 2020-01-21 NOTE — Progress Notes (Signed)
No egg or soy allergy known to patient  No issues with past sedation with any surgeries or procedures no intubation problems in the past  No FH of Malignant Hyperthermia No diet pills per patient No home 02 use per patient  No blood thinners per patient  Pt denies issues with constipation  No A fib or A flutter  EMMI video to pt or via [place] 19 guidelines implemented in PV today with Pt and RN     Due to the COVID-19 pandemic we are asking patients to follow these guidelines. Please only bring one care partner. Please be aware that your care partner may wait in the car in the parking lot or if they feel like they will be too hot to wait in the car, they may wait in the lobby on the 4th floor. All care partners are required to wear a mask the entire time (we do not have any that we can provide them), they need to practice social distancing, and we will do a Covid check for all patient's and care partners when you arrive. Also we will check their temperature and your temperature. If the care partner waits in their car they need to stay in the parking lot the entire time and we will call them on their cell phone when the patient is ready for discharge so they can bring the car to the front of the building. Also all patient's will need to wear a mask into building.

## 2020-02-01 ENCOUNTER — Encounter: Payer: Self-pay | Admitting: Gastroenterology

## 2020-02-01 ENCOUNTER — Telehealth: Payer: Self-pay | Admitting: Family Medicine

## 2020-02-01 NOTE — Telephone Encounter (Signed)
Patient called to discuss symptoms, left VM to return the call to the office if needed. Per NT note, patient advised to go to the ED. OV scheduled for Tuesday, 02/05/20 at 1530 with Dr. Sarajane Jews.

## 2020-02-01 NOTE — Telephone Encounter (Signed)
Patient called in wanting an appointment for chest pain, feeling jittery and just don't feel good.  I transferred the patient to nurse triage.  Waiting triage notes

## 2020-02-05 ENCOUNTER — Ambulatory Visit (INDEPENDENT_AMBULATORY_CARE_PROVIDER_SITE_OTHER): Payer: Commercial Managed Care - PPO | Admitting: Family Medicine

## 2020-02-05 ENCOUNTER — Other Ambulatory Visit: Payer: Self-pay

## 2020-02-05 ENCOUNTER — Encounter: Payer: Self-pay | Admitting: Family Medicine

## 2020-02-05 VITALS — BP 124/80 | HR 124 | Temp 98.3°F | Ht 66.0 in | Wt 229.4 lb

## 2020-02-05 DIAGNOSIS — F418 Other specified anxiety disorders: Secondary | ICD-10-CM

## 2020-02-05 MED ORDER — FLUOXETINE HCL 40 MG PO CAPS: 40.0000 mg | ORAL_CAPSULE | Freq: Every day | ORAL | 3 refills | Status: DC

## 2020-02-05 NOTE — Progress Notes (Signed)
   Subjective:    Patient ID: Laurie Sutton, female    DOB: 1980/08/04, 39 y.o.   MRN: [id number]  HPI Here for panic attacks and increased anxiety lately. She had been taking Lexapro for several years, but her anxiety started to worsen this past summer. We switched her from Lexapro to Effexor XR, and she took this for about 6 weeks. This was not helping so about 2 weeks ago she went back to Lexapro 90m a day. However she is still having panis attacks and feeling anxious all day.  Review of Systems  Constitutional: Negative.   Respiratory: Positive for chest tightness.   Cardiovascular: Positive for palpitations.  Psychiatric/Behavioral: The patient is nervous/anxious.        Objective:   Physical Exam Constitutional:      Appearance: Normal appearance.  Cardiovascular:     Rate and Rhythm: Normal rate and regular rhythm.     Pulses: Normal pulses.     Heart sounds: Normal heart sounds.  Pulmonary:     Effort: Pulmonary effort is normal.     Breath sounds: Normal breath sounds.  Neurological:     General: No focal deficit present.     Mental Status: She is alert and oriented to person, place, and time.  Psychiatric:        Mood and Affect: Mood normal.        Behavior: Behavior normal.        Thought Content: Thought content normal.        Judgment: Judgment normal.           Assessment & Plan:  Depression with anxiety. We will stop Lexapro and try Prozac 40 mg daily. She will report back in 2 weeks.  SAlysia Penna MD

## 2020-02-22 ENCOUNTER — Other Ambulatory Visit: Payer: Self-pay

## 2020-02-22 ENCOUNTER — Ambulatory Visit (AMBULATORY_SURGERY_CENTER): Payer: Commercial Managed Care - PPO | Admitting: Gastroenterology

## 2020-02-22 ENCOUNTER — Encounter: Payer: Self-pay | Admitting: Gastroenterology

## 2020-02-22 VITALS — BP 111/64 | HR 87 | Temp 97.1°F | Resp 19 | Ht 66.0 in | Wt 226.0 lb

## 2020-02-22 DIAGNOSIS — K2950 Unspecified chronic gastritis without bleeding: Secondary | ICD-10-CM | POA: Diagnosis not present

## 2020-02-22 DIAGNOSIS — R109 Unspecified abdominal pain: Secondary | ICD-10-CM

## 2020-02-22 DIAGNOSIS — K2970 Gastritis, unspecified, without bleeding: Secondary | ICD-10-CM

## 2020-02-22 MED ORDER — SODIUM CHLORIDE 0.9 % IV SOLN
500.0000 mL | INTRAVENOUS | Status: DC
Start: 2020-02-22 — End: 2020-02-22

## 2020-02-22 NOTE — Op Note (Signed)
[facility] Patient Name: Laurie Sutton Procedure Date: 02/22/2020 2:15 PM MRN: [id number] Endoscopist: Milus Banister , MD Age: 39 Referring MD:  Date of Birth: 1980-05-26 Gender: Female Account #: [id number] Procedure:                Upper GI endoscopy Indications:              Upper abdominal pain Medicines:                Monitored Anesthesia Care Procedure:                Pre-Anesthesia Assessment:                           - Prior to the procedure, a History and Physical                            was performed, and patient medications and                            allergies were reviewed. The patient's tolerance of                            previous anesthesia was also reviewed. The risks                            and benefits of the procedure and the sedation                            options and risks were discussed with the patient.                            All questions were answered, and informed consent                            was obtained. Prior Anticoagulants: The patient has                            taken no previous anticoagulant or antiplatelet                            agents. ASA Grade Assessment: II - A patient with                            mild systemic disease. After reviewing the risks                            and benefits, the patient was deemed in                            satisfactory condition to undergo the procedure.                           After obtaining informed consent, the endoscope was  passed under direct vision. Throughout the                            procedure, the patient's blood pressure, pulse, and                            oxygen saturations were monitored continuously. The                            Endoscope was introduced through the mouth, and                            advanced to the second part of duodenum. The upper                            GI endoscopy was accomplished  without difficulty.                            The patient tolerated the procedure well. Scope In: Scope Out: Findings:                 Mild inflammation characterized by erythema and                            friability was found in the gastric antrum.                            Biopsies were taken with a cold forceps for                            histology.                           The exam was otherwise without abnormality. Complications:            No immediate complications. Estimated blood loss:                            None. Estimated Blood Loss:     Estimated blood loss: none. Impression:               - Mild, non-specific gastritis. Biopsied to check                            for H. pylori.                           - The examination was otherwise normal. Recommendation:           - Patient has a contact number available for                            emergencies. The signs and symptoms of potential                            delayed complications were discussed with the  patient. Return to normal activities tomorrow.                            Written discharge instructions were provided to the                            patient.                           - Resume previous diet.                           - Continue present medications.                           - Await pathology results. Milus Banister, MD 02/22/2020 2:41:48 PM This report has been signed electronically.

## 2020-02-22 NOTE — Progress Notes (Signed)
Called to room to assist during endoscopic procedure.  Patient ID and intended procedure confirmed with present staff. Received instructions for my participation in the procedure from the performing physician.  

## 2020-02-22 NOTE — Progress Notes (Signed)
To PACU, VSS. Report to [email or web address]

## 2020-02-22 NOTE — Progress Notes (Signed)
V/s EW I have reviewed the patient's medical history in detail and updated the computerized patient record.

## 2020-02-22 NOTE — Patient Instructions (Signed)
Please read handouts provided. Continue present medications. Await pathology results.      YOU HAD AN ENDOSCOPIC PROCEDURE TODAY AT [facility]:   Refer to the procedure report that was given to you for any specific questions about what was found during the examination.  If the procedure report does not answer your questions, please call your gastroenterologist to clarify.  If you requested that your care partner not be given the details of your procedure findings, then the procedure report has been included in a sealed envelope for you to review at your convenience later.  YOU SHOULD EXPECT: Some feelings of bloating in the abdomen. Passage of more gas than usual.  Walking can help get rid of the air that was put into your GI tract during the procedure and reduce the bloating. If you had a lower endoscopy (such as a colonoscopy or flexible sigmoidoscopy) you may notice spotting of blood in your stool or on the toilet paper. If you underwent a bowel prep for your procedure, you may not have a normal bowel movement for a few days.  Please Note:  You might notice some irritation and congestion in your nose or some drainage.  This is from the oxygen used during your procedure.  There is no need for concern and it should clear up in a day or so.  SYMPTOMS TO REPORT IMMEDIATELY:    Following upper endoscopy (EGD)  Vomiting of blood or coffee ground material  New chest pain or pain under the shoulder blades  Painful or persistently difficult swallowing  New shortness of breath  Fever of 100F or higher  Black, tarry-looking stools  For urgent or emergent issues, a gastroenterologist can be reached at any hour by calling [phone]. Do not use MyChart messaging for urgent concerns.    DIET:  We do recommend a small meal at first, but then you may proceed to your regular diet.  Drink plenty of fluids but you should avoid alcoholic beverages for 24 hours.  ACTIVITY:  You  should plan to take it easy for the rest of today and you should NOT DRIVE or use heavy machinery until tomorrow (because of the sedation medicines used during the test).    FOLLOW UP: Our staff will call the number listed on your records 48-72 hours following your procedure to check on you and address any questions or concerns that you may have regarding the information given to you following your procedure. If we do not reach you, we will leave a message.  We will attempt to reach you two times.  During this call, we will ask if you have developed any symptoms of COVID 19. If you develop any symptoms (ie: fever, flu-like symptoms, shortness of breath, cough etc.) before then, please call [phone].  If you test positive for Covid 19 in the 2 weeks post procedure, please call and report this information to [name].    If any biopsies were taken you will be contacted by phone or by letter within the next 1-3 weeks.  Please call us at [phone] if you have not heard about the biopsies in 3 weeks.    SIGNATURES/CONFIDENTIALITY: You and/or your care partner have signed paperwork which will be entered into your electronic medical record.  These signatures attest to the fact that that the information above on your After Visit Summary has been reviewed and is understood.  Full responsibility of the confidentiality of this discharge information lies with you and/or  your care-partner.

## 2020-02-26 ENCOUNTER — Telehealth: Payer: Self-pay

## 2020-02-26 NOTE — Telephone Encounter (Signed)
  Follow up Call-  Call back number 02/22/2020  Post procedure Call Back phone  # [phone]  Permission to leave phone message Yes  Some recent data might be hidden     Patient questions:  Do you have a fever, pain , or abdominal swelling? No. Pain Score  0 *  Have you tolerated food without any problems? Yes.    Have you been able to return to your normal activities? Yes.    Do you have any questions about your discharge instructions: Diet   No. Medications  No. Follow up visit  No.  Do you have questions or concerns about your Care? No.  Actions: * If pain score is 4 or above: 1. No action needed, pain <4.Have you developed a fever since your procedure? no  2.   Have you had an respiratory symptoms (SOB or cough) since your procedure? no  3.   Have you tested positive for COVID 19 since your procedure no  4.   Have you had any family members/close contacts diagnosed with the COVID 19 since your procedure?  no   If yes to any of these questions please route to Joylene John, RN and Joella Prince, RN

## 2020-03-03 ENCOUNTER — Encounter: Payer: Self-pay | Admitting: Gastroenterology

## 2020-03-21 ENCOUNTER — Other Ambulatory Visit: Payer: Self-pay | Admitting: Family Medicine

## 2020-05-16 ENCOUNTER — Other Ambulatory Visit: Payer: Self-pay | Admitting: Neurology

## 2020-06-25 ENCOUNTER — Other Ambulatory Visit: Payer: Self-pay | Admitting: Neurology

## 2020-06-25 ENCOUNTER — Other Ambulatory Visit: Payer: Self-pay | Admitting: Family Medicine

## 2020-06-30 ENCOUNTER — Telehealth: Payer: Self-pay | Admitting: Gastroenterology

## 2020-06-30 NOTE — Telephone Encounter (Signed)
The pt has been having some GERD since last EGD.  She has been taking prilosec 40 mg daily.  She has been scheduled to see Nicoletta Ba on 07/14/20.  She was offered an earlier appt but the pt wanted an afternoon appt.

## 2020-06-30 NOTE — Telephone Encounter (Signed)
Pt states that she has been experiencing severe heartburn since she had EGD. She would like some advise.

## 2020-07-14 ENCOUNTER — Encounter: Payer: Self-pay | Admitting: Physician Assistant

## 2020-07-14 ENCOUNTER — Ambulatory Visit: Payer: Commercial Managed Care - PPO | Admitting: Physician Assistant

## 2020-07-14 ENCOUNTER — Other Ambulatory Visit: Payer: Self-pay

## 2020-07-14 VITALS — BP 120/70 | HR 79 | Ht 66.0 in | Wt 239.0 lb

## 2020-07-14 DIAGNOSIS — K219 Gastro-esophageal reflux disease without esophagitis: Secondary | ICD-10-CM | POA: Diagnosis not present

## 2020-07-14 DIAGNOSIS — K760 Fatty (change of) liver, not elsewhere classified: Secondary | ICD-10-CM | POA: Diagnosis not present

## 2020-07-14 MED ORDER — OMEPRAZOLE 40 MG PO CPDR: 40.0000 mg | DELAYED_RELEASE_CAPSULE | Freq: Two times a day (BID) | ORAL | 6 refills | Status: DC

## 2020-07-14 NOTE — Progress Notes (Signed)
Subjective:    Patient ID: Laurie Sutton, female    DOB: May 26, 1980, 40 y.o.   MRN: [id number]  HPI Autym is a pleasant 40 year old white female, established with Dr. [name] who comes in today with complaint of progressive GERD symptoms despite omeprazole 40 mg p.o. every morning.  Patient says her symptoms have been worse over the past 3 to 4 months and she is frequently having nighttime symptoms that are keeping her awake with heartburn and indigestion.  She says she has been eating Tums and over-the-counter Nexium in the evenings in addition to taking omeprazole in the morning.  She is also been having some daytime symptoms.  She says she was having a lot of trouble after lunchtime, she had been taking her omeprazole midmorning previously but has backed that up to earlier in the morning before breakfast and says that has helped somewhat but she still having symptoms.  She has no complaints of dysphagia or odynophagia. She has been on Mobic over the past 2 to 3 months for plantar fasciitis.  She says she is trying not to take it every day if she does not need it. She did have EGD in November 2021 with finding of mild gastritis, normal-appearing esophagus.  Biopsy showed mild chronic gastritis no H. pylori. She also mentions concerned about fatty liver and says she was supposed to be being referred back to Dr. [name] for further evaluation of the fatty liver.  She says she is concerned because she has an aunt who now has cirrhosis and is not doing well.  Her cirrhosis is felt secondary to nonalcoholic fatty liver disease.  She also had a paternal grandfather who had liver cancer. Patient has had changes of fatty liver for several years, she last had ultrasound in 2016 with finding of diffuse increased echogenicity and enlargement of the liver with a length of 22.4 cm, and splenomegaly. Patient says she has an upcoming appointment with a nutritionist. Other medical issues include adult onset  diabetes mellitus, obesity and depression/anxiety.  Most recent LFTs January 2022 were normal per [name].  Review of Systems Pertinent positive and negative review of systems were noted in the above HPI section.  All other review of systems was otherwise negative.  Outpatient Encounter Medications as of 07/14/2020  Medication Sig  . ALPRAZolam (XANAX) 1 MG tablet Take 1 tablet (1 mg total) by mouth 2 (two) times daily as needed for anxiety or sleep.  Marland Kitchen BAYER CONTOUR NEXT TEST test strip USE AS INSTRUCTED TO CHECK BLOOD SUGAR ONCE A DAY DX CODE E11.9  . BAYER MICROLET LANCETS lancets Use as instructed to check blood sugar once a day dx code E11.9  . clotrimazole-betamethasone (LOTRISONE) cream Apply 1 application topically 2 (two) times daily as needed. (Patient not taking: Reported on 02/22/2020)  . Continuous Blood Gluc Sensor (FREESTYLE LIBRE 14 DAY SENSOR) MISC Apply topically every 14 (fourteen) days.  . Cranberry 125 MG TABS Take by mouth.  Marland Kitchen FLUoxetine (PROZAC) 40 MG capsule Take 1 capsule (40 mg total) by mouth daily.  . furosemide (LASIX) 20 MG tablet TAKE 1 TABLET BY MOUTH EVERY DAY  . gabapentin (NEURONTIN) 100 MG capsule Take 1 capsule (100 mg total) by mouth 3 (three) times daily. (Patient taking differently: Take 100 mg by mouth 3 (three) times daily as needed. )  . Insulin Regular Human (HUMULIN R U-500, CONCENTRATED, ) Inject into the skin. 190 units SQ morning, 160 units SQ afternoon, 150 units SQ dinner time  .  meloxicam (MOBIC) 7.5 MG tablet Take 7.5 mg by mouth daily.  . metFORMIN (GLUCOPHAGE-XR) 500 MG 24 hr tablet Take 1,500 mg by mouth at bedtime.  . montelukast (SINGULAIR) 10 MG tablet TAKE 1 TABLET BY MOUTH EVERYDAY AT BEDTIME  . nortriptyline (PAMELOR) 10 MG capsule TAKE 3 CAPSULES BY MOUTH AT BEDTIME  . nystatin (MYCOSTATIN) 100000 UNIT/ML suspension Take 5 mLs (500,000 Units total) by mouth 4 (four) times daily. (Patient not taking: Reported on 01/21/2020)  .  omeprazole (PRILOSEC) 40 MG capsule Take 1 capsule (40 mg total) by mouth 2 (two) times daily before a meal. Take 1 capsule daily 30 minutes prior to breakfast  . propranolol (INDERAL) 40 MG tablet TAKE 1 TABLET BY MOUTH TWICE A DAY  . rizatriptan (MAXALT) 10 MG tablet Take 1 tablet (10 mg total) by mouth 3 (three) times daily as needed. (Patient not taking: Reported on 02/22/2020)  . [DISCONTINUED] escitalopram (LEXAPRO) 20 MG tablet TAKE 1 TABLET BY MOUTH EVERY DAY (Patient not taking: Reported on 07/14/2020)  . [DISCONTINUED] omeprazole (PRILOSEC) 40 MG capsule Take 1 capsule daily 30 minutes prior to breakfast   No facility-administered encounter medications on file as of 07/14/2020.   Allergies  Allergen Reactions  . Amoxicillin Hives and Itching  . Azithromycin   . Latex   . Penicillins Hives    Has patient had a PCN reaction causing immediate rash, facial/tongue/throat swelling, SOB or lightheadedness with hypotension: yes. Rash  Has patient had a PCN reaction causing severe rash involving mucus membranes or skin necrosis: Yes- rash and hives all over body  Has patient had a PCN reaction that required hospitalization No Has patient had a PCN reaction occurring within the last 10 years: Yes  If all of the above answers are "NO", then may proceed with Cephalosporin use.   . Sitagliptin Other (See Comments)    Body aches all day Other reaction(s): Arthralgia (Joint Pain)   Patient Active Problem List   Diagnosis Date Noted  . COVID-19 virus infection 08/07/2019  . Allergic rhinitis 07/25/2017  . Hepatic steatosis 06/09/2016  . Diabetes mellitus without complication ([place]) 28/31/5176  . GERD (gastroesophageal reflux disease) 01/22/2014  . Low back pain 11/12/2013  . Migraine headache 08/06/2013  . Frequent UTI 02/29/2012  . Migraines 02/16/2011  . Depression with anxiety 02/16/2011   Social History   Socioeconomic History  . Marital status: Married    Spouse name: Not on file   . Number of children: 1  . Years of education: Not on file  . Highest education level: Not on file  Occupational History  . Not on file  Tobacco Use  . Smoking status: Never Smoker  . Smokeless tobacco: Never Used  Substance and Sexual Activity  . Alcohol use: No    Alcohol/week: 0.0 standard drinks  . Drug use: No  . Sexual activity: Yes    Birth control/protection: None  Other Topics Concern  . Not on file  Social History Narrative  . Not on file   Social Determinants of Health   Financial Resource Strain: Not on file  Food Insecurity: Not on file  Transportation Needs: Not on file  Physical Activity: Not on file  Stress: Not on file  Social Connections: Not on file  Intimate Partner Violence: Not on file    Ms. Cassaday's family history includes Cerebral aneurysm in her cousin; Diabetes in her father, maternal grandfather, and mother; Hyperlipidemia in her mother; Migraines in her maternal aunt, maternal grandmother, and mother;  Stroke in her mother.      Objective:    Vitals:   07/14/20 1454  BP: 120/70  Pulse: 79  SpO2: 98%    Physical Exam Well-developed well-nourished  WF  in no acute distress.  Height, Weight, BMI 38.5  HEENT; nontraumatic normocephalic, EOMI, PE R LA, sclera anicteric. Oropharynx; not done  today Neck; supple, no JVD Cardiovascular; regular rate and rhythm with S1-S2, no murmur rub or gallop Pulmonary; Clear bilaterally Abdomen; soft, obese nontender, nondistended, liver is palpable 3 fingerbreadths below the right costal margin, no palpable splenomegaly, bowel sounds are active Rectal;not done Skin; benign exam, no jaundice rash or appreciable lesions Extremities; no clubbing cyanosis or edema skin warm and dry Neuro/Psych; alert and oriented x4, grossly nonfocal mood and affect appropriate       Assessment & Plan:   #70 40 year old female with chronic GERD with worsening symptoms over the past 4 months despite omeprazole 40 mg  every morning.  Patient is having daytime and nocturnal symptoms with the nocturnal symptoms being more bothersome.  She has been on Mobic over the past 3 months which may be exacerbating symptoms  #2 fatty liver-normal LFTs December 2021, last ultrasound 2016 with hepatomegaly and splenomegaly with diffuse steatosis Suspect this is secondary to nonalcoholic fatty liver disease.  #3 morbid obesity-BMI 38 #4.  Adult onset diabetes mellitus  Plan strict antireflux regimen and diet, patient was provided with educational materials.  Advised n.p.o. for 2 to 3 hours prior to bedtime and elevation of her back at least 45 degrees for sleep  Will increase omeprazole to 40 mg p.o. twice daily AC breakfast and AC dinner  Schedule for ultrasound with elastography. We will schedule follow-up office visit with Dr. [name] for further discussion of fatty liver.  She may need further lab investigation to rule out other con current autoimmune and/or inheritable factors contributing to her fatty liver (ferritin level 55    2021) She was advised that seeing a nutritionist is a very good idea, weight loss will be key for helping to manage her fatty liver, and advised moderate exercise several days per week, and carb modified diet   Rushie Brazel S Jony Ladnier PA-C 07/14/2020   Cc: Laurey Morale, MD

## 2020-07-14 NOTE — Patient Instructions (Addendum)
If you are age 40 or older, your body mass index should be between 23-30. Your Body mass index is 38.58 kg/m. If this is out of the aforementioned range listed, please consider follow up with your Primary Care Provider.  If you are age 53 or younger, your body mass index should be between 19-25. Your Body mass index is 38.58 kg/m. If this is out of the aformentioned range listed, please consider follow up with your Primary Care Provider.   You will be contacted by [facility] in the next 2 days to arrange a Abdominal Ultrasound.  The number on your caller ID will be [phone], please answer when they call.  If you have not heard from them in 2 days please call [phone] to schedule.    Increase Omeprazole 40 mg to twice daily before breakfast and dinner.  Try to stop taking Mobic/Meloxicam  Start an Anti-Reflux diet/precautions  You have been scheduled to follow up with Dr. [name] on Aug 25, 2020 at 3:00.  Thank you for entrusting me with your care and choosing [facility].  Amy Esterwood, PA-C

## 2020-07-15 ENCOUNTER — Ambulatory Visit: Payer: Commercial Managed Care - PPO | Admitting: Neurology

## 2020-07-15 NOTE — Progress Notes (Signed)
I agree with the above note, plan 

## 2020-07-23 ENCOUNTER — Other Ambulatory Visit: Payer: Self-pay | Admitting: Family Medicine

## 2020-07-29 ENCOUNTER — Ambulatory Visit: Payer: Commercial Managed Care - PPO | Admitting: Family Medicine

## 2020-07-29 DIAGNOSIS — Z0289 Encounter for other administrative examinations: Secondary | ICD-10-CM

## 2020-07-30 ENCOUNTER — Encounter: Payer: Self-pay | Admitting: Family Medicine

## 2020-07-30 ENCOUNTER — Ambulatory Visit (INDEPENDENT_AMBULATORY_CARE_PROVIDER_SITE_OTHER): Payer: Commercial Managed Care - PPO | Admitting: Family Medicine

## 2020-07-30 DIAGNOSIS — F418 Other specified anxiety disorders: Secondary | ICD-10-CM | POA: Diagnosis not present

## 2020-07-30 MED ORDER — LAMOTRIGINE 25 MG PO TABS: 25.0000 mg | ORAL_TABLET | Freq: Every day | ORAL | 3 refills | Status: DC

## 2020-07-30 NOTE — Progress Notes (Signed)
   Subjective:    Patient ID: Laurie Sutton, female    DOB: 06-02-1980, 40 y.o.   MRN: [id number]  HPI Virtual Visit via Telephone Note  I connected with the patient on 07/30/20 at 10:15 AM EDT by telephone and verified that I am speaking with the correct person using two identifiers.   I discussed the limitations, risks, security and privacy concerns of performing an evaluation and management service by telephone and the availability of in person appointments. I also discussed with the patient that there may be a patient responsible charge related to this service. The patient expressed understanding and agreed to proceed.  Location patient: home Location provider: work or home office Participants present for the call: patient, provider Patient did not have a visit in the prior 7 days to address this/these issue(s).   History of Present Illness: Here asking if a medication can be added to her Prozac to avoid days when she feels extremely anxious. These do not come very frequently, but some days she feels that she cannot relax enough to function. She takes 40 mg of Prozac every morning. She takes Xanax at bedtime to help her sleep. She does not take this in the daytime because it makes her sleepy.    Observations/Objective: Patient sounds cheerful and well on the phone. I do not appreciate any SOB. Speech and thought processing are grossly intact. Patient reported vitals:  Assessment and Plan: Depression with anxiety. She will try taking Lamictal 25 mg at bedtime in addition to her current medications. Recheck in 4 weeks. Alysia Penna, MD   Follow Up Instructions:     [phone] 5-10 [phone] 11-20 9443 21-30 I did not refer this patient for an OV in the next 24 hours for this/these issue(s).  I discussed the assessment and treatment plan with the patient. The patient was provided an opportunity to ask questions and all were answered. The patient agreed with the plan and  demonstrated an understanding of the instructions.   The patient was advised to call back or seek an in-person evaluation if the symptoms worsen or if the condition fails to improve as anticipated.  I provided 18 minutes of non-face-to-face time during this encounter.   Alysia Penna, MD    Review of Systems     Objective:   Physical Exam        Assessment & Plan:

## 2020-08-07 ENCOUNTER — Other Ambulatory Visit: Payer: Self-pay | Admitting: Family Medicine

## 2020-08-07 NOTE — Telephone Encounter (Signed)
Last office visit- 07/30/20 Last refill- 11/30/19-60 tabs with 2 refills  No future visit scheduled

## 2020-08-22 ENCOUNTER — Other Ambulatory Visit: Payer: Self-pay | Admitting: Physician Assistant

## 2020-08-25 ENCOUNTER — Other Ambulatory Visit (INDEPENDENT_AMBULATORY_CARE_PROVIDER_SITE_OTHER): Payer: Commercial Managed Care - PPO

## 2020-08-25 ENCOUNTER — Encounter: Payer: Self-pay | Admitting: Gastroenterology

## 2020-08-25 ENCOUNTER — Ambulatory Visit: Payer: Commercial Managed Care - PPO | Admitting: Gastroenterology

## 2020-08-25 VITALS — BP 120/62 | HR 92 | Ht 66.0 in | Wt 240.4 lb

## 2020-08-25 DIAGNOSIS — K760 Fatty (change of) liver, not elsewhere classified: Secondary | ICD-10-CM

## 2020-08-25 LAB — COMPREHENSIVE METABOLIC PANEL
ALT: 31 U/L (ref 0–35)
AST: 32 U/L (ref 0–37)
Albumin: 4.3 g/dL (ref 3.5–5.2)
Alkaline Phosphatase: 112 U/L (ref 39–117)
BUN: 12 mg/dL (ref 6–23)
CO2: 26 mEq/L (ref 19–32)
Calcium: 9.8 mg/dL (ref 8.4–10.5)
Chloride: 97 mEq/L (ref 96–112)
Creatinine, Ser: 0.56 mg/dL (ref 0.40–1.20)
GFR: 114.86 mL/min (ref >60.00)
Glucose, Bld: 209 mg/dL — ABNORMAL HIGH (ref 70–99)
Potassium: 3.2 mEq/L — ABNORMAL LOW (ref 3.5–5.1)
Sodium: 135 mEq/L (ref 135–145)
Total Bilirubin: 0.8 mg/dL (ref 0.2–1.2)
Total Protein: 7.3 g/dL (ref 6.0–8.3)

## 2020-08-25 LAB — CBC
HCT: 41.3 % (ref 36.0–46.0)
Hemoglobin: 14.2 g/dL (ref 12.0–15.0)
MCHC: 34.3 g/dL (ref 30.0–36.0)
MCV: 93.9 fl (ref 78.0–100.0)
Platelets: 102 10*3/uL — ABNORMAL LOW (ref 150.0–400.0)
RBC: 4.4 Mil/uL (ref 3.87–5.11)
RDW: 18.4 % — ABNORMAL HIGH (ref 11.5–15.5)
WBC: 4.8 10*3/uL (ref 4.0–10.5)

## 2020-08-25 LAB — PROTIME-INR
INR: 1.1 ratio — ABNORMAL HIGH (ref 0.8–1.0)
Prothrombin Time: 12.6 s (ref 9.6–13.1)

## 2020-08-25 MED ORDER — OMEPRAZOLE 40 MG PO CPDR: 40.0000 mg | DELAYED_RELEASE_CAPSULE | Freq: Two times a day (BID) | ORAL | 6 refills | Status: DC

## 2020-08-25 NOTE — Progress Notes (Signed)
Review of pertinent gastrointestinal problems: 1.  GERD without alarm symptoms evaluated 2016 Dr. [name].  She was drinking multiple (5-10) cans of caffeinated soda daily, also 10-15 peppermint candies daily.  I recommended lifestyle modifications and observation.  She was going to try to wean off of proton pump inhibitor over time as well. 2.  Upper abdominal pain led to EGD November 2021.  Mild distal gastritis was noted, biopsies were negative for H. Pylori. 3.  Likely fatty liver.  Ultrasound September 2016 showed "enlarged liver with increased echogenicity suggestive of steatosis.  Splenomegaly."  CT scan September 2016 showed "fatty infiltration of the liver".   HPI: This is a very pleasant 40 year old woman  She was here in our office about 1 month ago when she saw Amy.  They discussed her worsening GERD symptoms and again he increased her omeprazole from 40 mg once daily to 40 mg twice daily.  An ultrasound of the liver with elastography was ordered.  That has not been done yet  Blood work May 2021 shows normal LFTs except AST 64.  I do not see any other LFT blood tests in epic or Care Everywhere since 1 year ago.  Her weight is up 10 pounds since March 2021.  Today she tells me she thinks she has gained 10 to 15 pounds in the past 6 months  She tried to twice daily omeprazole for about a week and then ran out.  She has been taking over-the-counter omeprazole once daily since then.  She stopped her meloxicam.  She actually feels really quite a bit better since stopping her meloxicam.  She started a new diet today.  She never really drinks much alcohol.   ROS: complete GI ROS as described in HPI, all other review negative.  Constitutional:  No unintentional weight loss   Past Medical History:  Diagnosis Date  . Anxiety   . Depression   . Diabetes mellitus without complication ([place])    sees [name]   . GERD (gastroesophageal reflux disease)   . Gynecological examination     sees Dr. Josefa Half  . Migraines   . Murmur, cardiac     Past Surgical History:  Procedure Laterality Date  . CESAREAN SECTION      Current Outpatient Medications  Medication Sig Dispense Refill  . ALPRAZolam (XANAX) 1 MG tablet TAKE 1 TABLET (1 MG TOTAL) BY MOUTH 2 (TWO) TIMES DAILY AS NEEDED FOR ANXIETY OR SLEEP. 60 tablet 5  . BAYER CONTOUR NEXT TEST test strip USE AS INSTRUCTED TO CHECK BLOOD SUGAR ONCE A DAY DX CODE E11.9 50 each 3  . BAYER MICROLET LANCETS lancets Use as instructed to check blood sugar once a day dx code E11.9 100 each 1  . clotrimazole-betamethasone (LOTRISONE) cream Apply 1 application topically 2 (two) times daily as needed. 30 g 2  . Continuous Blood Gluc Sensor (FREESTYLE LIBRE 14 DAY SENSOR) MISC Apply topically every 14 (fourteen) days. (Patient not taking: Reported on 07/30/2020)    . Cranberry 125 MG TABS Take by mouth.    Marland Kitchen FLUoxetine (PROZAC) 40 MG capsule Take 1 capsule (40 mg total) by mouth daily. 90 capsule 3  . furosemide (LASIX) 20 MG tablet TAKE 1 TABLET BY MOUTH EVERY DAY 90 tablet 0  . gabapentin (NEURONTIN) 100 MG capsule Take 1 capsule (100 mg total) by mouth 3 (three) times daily. (Patient taking differently: Take 100 mg by mouth 3 (three) times daily as needed.) 90 capsule 3  . Insulin  Regular Human (HUMULIN R U-500, CONCENTRATED, [place]) Inject into the skin. 190 units SQ morning, 160 units SQ afternoon, 150 units SQ dinner time    . lamoTRIgine (LAMICTAL) 25 MG tablet Take 1 tablet (25 mg total) by mouth at bedtime. 30 tablet 3  . meloxicam (MOBIC) 7.5 MG tablet Take 7.5 mg by mouth daily.    . metFORMIN (GLUCOPHAGE-XR) 500 MG 24 hr tablet Take 1,500 mg by mouth at bedtime.    . montelukast (SINGULAIR) 10 MG tablet TAKE 1 TABLET BY MOUTH EVERYDAY AT BEDTIME 90 tablet 0  . nortriptyline (PAMELOR) 10 MG capsule TAKE 3 CAPSULES BY MOUTH AT BEDTIME 90 capsule 0  . nystatin (MYCOSTATIN) 100000 UNIT/ML suspension Take 5 mLs (500,000 Units total) by  mouth 4 (four) times daily. 200 mL 0  . omeprazole (PRILOSEC) 40 MG capsule Take 1 capsule (40 mg total) by mouth 2 (two) times daily before a meal. 60 capsule 6  . propranolol (INDERAL) 40 MG tablet TAKE 1 TABLET BY MOUTH TWICE A DAY 60 tablet 0  . rizatriptan (MAXALT) 10 MG tablet Take 1 tablet (10 mg total) by mouth 3 (three) times daily as needed. 10 tablet 11   No current facility-administered medications for this visit.    Allergies as of 08/25/2020 - Review Complete 08/25/2020  Allergen Reaction Noted  . Amoxicillin Hives and Itching 02/16/2011  . Azithromycin  07/25/2017  . Latex  07/25/2017  . Penicillins Hives 02/16/2011  . Sitagliptin Other (See Comments) 11/18/2014    Family History  Problem Relation Age of Onset  . Hyperlipidemia Mother   . Stroke Mother   . Diabetes Mother   . Migraines Mother   . Diabetes Father   . Migraines Maternal Aunt   . Migraines Maternal Grandmother   . Cerebral aneurysm Cousin   . Diabetes Maternal Grandfather   . Heart disease Neg Hx     Social History   Socioeconomic History  . Marital status: Married    Spouse name: Not on file  . Number of children: 1  . Years of education: Not on file  . Highest education level: Not on file  Occupational History  . Not on file  Tobacco Use  . Smoking status: Never Smoker  . Smokeless tobacco: Never Used  Substance and Sexual Activity  . Alcohol use: No    Alcohol/week: 0.0 standard drinks  . Drug use: No  . Sexual activity: Yes    Birth control/protection: None  Other Topics Concern  . Not on file  Social History Narrative  . Not on file   Social Determinants of Health   Financial Resource Strain: Not on file  Food Insecurity: Not on file  Transportation Needs: Not on file  Physical Activity: Not on file  Stress: Not on file  Social Connections: Not on file  Intimate Partner Violence: Not on file     Physical Exam: Ht 5' 6"  (1.676 m)   Wt 240 lb 6.4 oz (109 kg)   BMI  38.80 kg/m  Constitutional: generally well-appearing Psychiatric: alert and oriented x3 Abdomen: soft, nontender, nondistended, no obvious ascites, no peritoneal signs, normal bowel sounds No peripheral edema noted in lower extremities  Assessment and plan: 40 y.o. female with GERD without alarm symptoms, fatty liver  I am going to refill her omeprazole at 40 mg strength, 1 pill once daily.  She has no alarm symptoms.  No need for upper endoscopy.  She has documented fatty liver based on imaging in 2016.  She has not had liver imaging since then.  We will arrange right upper quadrant ultrasound and order basic labs to check for general liver function including CBC, complete metabolic profile and coags.  She understands that pending these lab results she might need further liver testing to exclude other potential causes of elevated liver tests if she has any.  She knows the best thing she can do for her liver if she indeed has underlying fatty liver which I think she probably does is to continue to try to lose weight.  Please see the "Patient Instructions" section for addition details about the plan.  Owens Loffler, MD [facility] 08/25/2020, 3:12 PM   Total time on date of encounter was 30 minutes (this included time spent preparing to see the patient reviewing records; obtaining and/or reviewing separately obtained history; performing a medically appropriate exam and/or evaluation; counseling and educating the patient and family if present; ordering medications, tests or procedures if applicable; and documenting clinical information in the health record).

## 2020-08-25 NOTE — Patient Instructions (Signed)
If you are age 40 or older, your body mass index should be between 23-30. Your Body mass index is 38.8 kg/m. If this is out of the aforementioned range listed, please consider follow up with your Primary Care Provider.  If you are age 28 or younger, your body mass index should be between 19-25. Your Body mass index is 38.8 kg/m. If this is out of the aformentioned range listed, please consider follow up with your Primary Care Provider.   Your provider has requested that you go to the basement level for lab work before leaving today. Press "B" on the elevator. The lab is located at the first door on the left as you exit the elevator.  You have been scheduled for an abdominal ultrasound at [facility] (1st floor of hospital) on 5/26/222 at 9:00 am. Please arrive 15 minutes prior to your appointment for registration. Make certain not to have anything to eat or drink 6 hours prior to your appointment. Should you need to reschedule your appointment, please contact radiology at [phone]. This test typically takes about 30 minutes to perform.  We have sent refills on your Omeprazole to your pharmacy.   Follow up pending the results of your Ultrasound or as needed.  Thank you for entrusting me with your care and choosing [facility].  Dr [name]

## 2020-08-28 ENCOUNTER — Other Ambulatory Visit: Payer: Self-pay

## 2020-08-29 ENCOUNTER — Ambulatory Visit: Payer: Commercial Managed Care - PPO | Admitting: Family Medicine

## 2020-08-29 ENCOUNTER — Encounter: Payer: Self-pay | Admitting: Family Medicine

## 2020-08-29 VITALS — BP 130/80 | HR 116 | Temp 98.4°F | Wt 234.0 lb

## 2020-08-29 DIAGNOSIS — J40 Bronchitis, not specified as acute or chronic: Secondary | ICD-10-CM

## 2020-08-29 MED ORDER — LEVOFLOXACIN 500 MG PO TABS: 500.0000 mg | ORAL_TABLET | Freq: Every day | ORAL | 0 refills | Status: AC

## 2020-08-29 NOTE — Progress Notes (Signed)
   Subjective:    Patient ID: Laurie Sutton, female    DOB: 01/20/1981, 40 y.o.   MRN: [id number]  HPI Here for 3 days of stuffy head, PND, chest congestion and a dry cough. She has a headache but no body aches. No fever or SOB. No NVD. Taking Tylenol Sinus and drinking fluids.   Review of Systems  Constitutional: Negative.   HENT: Positive for congestion, postnasal drip and sinus pressure. Negative for ear pain and sore throat.   Eyes: Negative.   Respiratory: Positive for cough. Negative for shortness of breath and wheezing.        Objective:   Physical Exam Constitutional:      General: She is not in acute distress.    Appearance: Normal appearance.  HENT:     Right Ear: Tympanic membrane, ear canal and external ear normal.     Left Ear: Tympanic membrane, ear canal and external ear normal.     Nose: Nose normal.     Mouth/Throat:     Pharynx: Oropharynx is clear.  Eyes:     Conjunctiva/sclera: Conjunctivae normal.  Pulmonary:     Effort: Pulmonary effort is normal. No respiratory distress.     Breath sounds: Rhonchi present. No wheezing or rales.  Lymphadenopathy:     Cervical: No cervical adenopathy.  Neurological:     Mental Status: She is alert.           Assessment & Plan:  Bronchitis, treat with Levaquin for 10 days.  Alysia Penna, MD

## 2020-09-01 ENCOUNTER — Ambulatory Visit: Payer: Commercial Managed Care - PPO | Admitting: Podiatry

## 2020-09-01 ENCOUNTER — Other Ambulatory Visit: Payer: Self-pay

## 2020-09-01 DIAGNOSIS — K769 Liver disease, unspecified: Secondary | ICD-10-CM

## 2020-09-01 DIAGNOSIS — K760 Fatty (change of) liver, not elsewhere classified: Secondary | ICD-10-CM

## 2020-09-03 ENCOUNTER — Other Ambulatory Visit: Payer: Self-pay

## 2020-09-03 ENCOUNTER — Ambulatory Visit: Payer: Commercial Managed Care - PPO

## 2020-09-03 ENCOUNTER — Ambulatory Visit (INDEPENDENT_AMBULATORY_CARE_PROVIDER_SITE_OTHER): Payer: Commercial Managed Care - PPO

## 2020-09-03 ENCOUNTER — Ambulatory Visit: Payer: Commercial Managed Care - PPO | Admitting: Podiatry

## 2020-09-03 DIAGNOSIS — M722 Plantar fascial fibromatosis: Secondary | ICD-10-CM | POA: Diagnosis not present

## 2020-09-03 NOTE — Progress Notes (Signed)
   Subjective: 40 y.o. female presenting as a new patient for evaluation of chronic plantar fasciitis to the right heel.  Patient states that she has had acute flareups of plantar fasciitis for several years.  Recently over the last 6 months she has had right heel pain and been treated at Dr. Gershon Mussel, local podiatrist without any improvement.  She presents today for surgical consultation  She also states that she has a chronic ingrown toenail to the lateral border of the right hallux that she tries to take out herself.  She was advised to have it addressed while in surgery as well.   Past Medical History:  Diagnosis Date  . Anxiety   . Depression   . Diabetes mellitus without complication ([place])    sees [name]   . GERD (gastroesophageal reflux disease)   . Gynecological examination    sees Dr. Josefa Half  . Migraines   . Murmur, cardiac      Objective: Physical Exam General: The patient is alert and oriented x3 in no acute distress.  Dermatology: Skin is warm, dry and supple bilateral lower extremities. Negative for open lesions or macerations bilateral.  Incurvated nail that appears to be protruding into the lateral nail fold right hallux lateral border.  Associated tenderness to palpation  Vascular: Dorsalis Pedis and Posterior Tibial pulses palpable bilateral.  Capillary fill time is immediate to all digits.  Neurological: Epicritic and protective threshold intact bilateral.   Musculoskeletal: Tenderness to palpation to the plantar aspect of the right heel along the plantar fascia. All other joints range of motion within normal limits bilateral. Strength 5/5 in all groups bilateral.   Radiographic exam: Normal osseous mineralization. Joint spaces preserved. No fracture/dislocation/boney destruction. No other soft tissue abnormalities or radiopaque foreign bodies.   Assessment: 1. Plantar fasciitis right 2.  Chronic ingrown toenail right hallux lateral border  Plan of Care:   1. Patient evaluated. Xrays reviewed.   2. Today we discussed the conservative versus surgical management of the presenting pathology. The patient opts for surgical management. All possible complications and details of the procedure were explained. All patient questions were answered. No guarantees were expressed or implied. 3. Authorization for surgery was initiated today. Surgery will consist of endoscopic plantar fasciotomy right.  Partial permanent nail avulsion right hallux lateral border. 4.  Return to clinic 1 week postop   *Patient is going to schedule a hysterectomy shortly after foot surgery *Works in [employer]. Daughter, Ava, in 7th grade at [employer]   Laurie Sutton, DPM [facility]  Dr. Edrick Sutton, DPM    [address], [place] [address]                Office [phone]  Fax [phone]

## 2020-09-04 ENCOUNTER — Ambulatory Visit (HOSPITAL_COMMUNITY): Payer: Commercial Managed Care - PPO

## 2020-09-05 ENCOUNTER — Telehealth: Payer: Self-pay | Admitting: Urology

## 2020-09-05 NOTE — Telephone Encounter (Signed)
DOS - 10/02/20  EPF RIGHT --- 37357 EXC NAIL PERM 1ST RIGHT --- 11750   [facility] EFFECTIVE DATE - 04/12/20   PLAN DEDUCTIBLE - $1,250.00 W/ $1,036.94 REMAINING OUT OF POCKET -  $1,036.94 W/ $8,978.47 REMIANING COINSURANCE - 20% COPAY - $0.00   SPOKE WITH FRITZ WITH [facility] AND SHE STATED THAT FOR CPT CODES 84128 AND 20813 NO PRIOR AUTH IS REQUIRED.   REF # [id number]

## 2020-09-22 ENCOUNTER — Other Ambulatory Visit: Payer: Self-pay

## 2020-09-22 ENCOUNTER — Ambulatory Visit (HOSPITAL_COMMUNITY)
Admission: RE | Admit: 2020-09-22 | Discharge: 2020-09-22 | Disposition: A | Discharge status: Home / Self Care | Payer: Commercial Managed Care - PPO | Source: Ambulatory Visit | Attending: Gastroenterology | Admitting: Gastroenterology

## 2020-09-22 DIAGNOSIS — K769 Liver disease, unspecified: Secondary | ICD-10-CM

## 2020-09-22 DIAGNOSIS — K760 Fatty (change of) liver, not elsewhere classified: Secondary | ICD-10-CM | POA: Insufficient documentation

## 2020-09-24 ENCOUNTER — Other Ambulatory Visit: Payer: Self-pay

## 2020-09-25 ENCOUNTER — Encounter: Payer: Self-pay | Admitting: Internal Medicine

## 2020-09-25 ENCOUNTER — Ambulatory Visit: Payer: Commercial Managed Care - PPO | Admitting: Internal Medicine

## 2020-09-25 VITALS — BP 102/62 | HR 74 | Temp 98.5°F | Ht 66.0 in | Wt 233.0 lb

## 2020-09-25 DIAGNOSIS — H6121 Impacted cerumen, right ear: Secondary | ICD-10-CM | POA: Diagnosis not present

## 2020-09-25 NOTE — Progress Notes (Signed)
Acute office Visit     This visit occurred during the SARS-CoV-2 public health emergency.  Safety protocols were in place, including screening questions prior to the visit, additional usage of staff PPE, and extensive cleaning of exam room while observing appropriate contact time as indicated for disinfecting solutions.    CC/Reason for Visit: Hearing loss right ear  HPI: Laurie Sutton is a 40 y.o. female who is coming in today for the above mentioned reasons.  For the past 3 days has noticed right near sensation of fullness with hearing loss.  She requires her ears to be cleaned out frequently.  She has had no other URI symptoms.  Past Medical/Surgical History: Past Medical History:  Diagnosis Date   Anxiety    Depression    Diabetes mellitus without complication ([place])    sees [name]    GERD (gastroesophageal reflux disease)    Gynecological examination    sees Dr. Josefa Half   Migraines    Murmur, cardiac     Past Surgical History:  Procedure Laterality Date   CESAREAN SECTION      Social History:  reports that she has never smoked. She has never used smokeless tobacco. She reports that she does not drink alcohol and does not use drugs.  Allergies: Allergies  Allergen Reactions   Amoxicillin Hives and Itching   Azithromycin    Dulaglutide Other (See Comments)   Empagliflozin-Metformin Hcl Er Other (See Comments)   Latex    Penicillins Hives    Has patient had a PCN reaction causing immediate rash, facial/tongue/throat swelling, SOB or lightheadedness with hypotension: yes. Rash  Has patient had a PCN reaction causing severe rash involving mucus membranes or skin necrosis: Yes- rash and hives all over body  Has patient had a PCN reaction that required hospitalization No Has patient had a PCN reaction occurring within the last 10 years: Yes  If all of the above answers are "NO", then may proceed with Cephalosporin use.    Sitagliptin Other (See  Comments)    Body aches all day Other reaction(s): Arthralgia (Joint Pain)   Sitagliptin-Metformin Hcl Other (See Comments)    Family History:  Family History  Problem Relation Age of Onset   Hyperlipidemia Mother    Stroke Mother    Diabetes Mother    Migraines Mother    Diabetes Father    Migraines Maternal Aunt    Migraines Maternal Grandmother    Cerebral aneurysm Cousin    Diabetes Maternal Grandfather    Heart disease Neg Hx      Current Outpatient Medications:    ALPRAZolam (XANAX) 1 MG tablet, TAKE 1 TABLET (1 MG TOTAL) BY MOUTH 2 (TWO) TIMES DAILY AS NEEDED FOR ANXIETY OR SLEEP., Disp: 60 tablet, Rfl: 5   BAYER CONTOUR NEXT TEST test strip, USE AS INSTRUCTED TO CHECK BLOOD SUGAR ONCE A DAY DX CODE E11.9, Disp: 50 each, Rfl: 3   BAYER MICROLET LANCETS lancets, Use as instructed to check blood sugar once a day dx code E11.9, Disp: 100 each, Rfl: 1   clotrimazole-betamethasone (LOTRISONE) cream, Apply 1 application topically 2 (two) times daily as needed., Disp: 30 g, Rfl: 2   Continuous Blood Gluc Sensor (FREESTYLE LIBRE 14 DAY SENSOR) MISC, Apply topically every 14 (fourteen) days., Disp: , Rfl:    Cranberry 125 MG TABS, Take by mouth., Disp: , Rfl:    FLUoxetine (PROZAC) 40 MG capsule, Take 1 capsule (40 mg total) by mouth daily.,  Disp: 90 capsule, Rfl: 3   furosemide (LASIX) 20 MG tablet, TAKE 1 TABLET BY MOUTH EVERY DAY, Disp: 90 tablet, Rfl: 0   gabapentin (NEURONTIN) 100 MG capsule, Take 1 capsule (100 mg total) by mouth 3 (three) times daily. (Patient taking differently: Take 100 mg by mouth 3 (three) times daily as needed.), Disp: 90 capsule, Rfl: 3   insulin glargine (LANTUS) 100 UNIT/ML injection, Inject 130 Units into the skin daily., Disp: , Rfl:    Insulin Regular Human (HUMULIN R U-500, CONCENTRATED, [place]), Inject into the skin. 190 units SQ morning, 160 units SQ afternoon, 150 units SQ dinner time, Disp: , Rfl:    lamoTRIgine (LAMICTAL) 25 MG tablet, Take 1  tablet (25 mg total) by mouth at bedtime., Disp: 30 tablet, Rfl: 3   metFORMIN (GLUCOPHAGE-XR) 500 MG 24 hr tablet, Take 1,500 mg by mouth at bedtime., Disp: , Rfl:    montelukast (SINGULAIR) 10 MG tablet, TAKE 1 TABLET BY MOUTH EVERYDAY AT BEDTIME, Disp: 90 tablet, Rfl: 0   nortriptyline (PAMELOR) 10 MG capsule, TAKE 3 CAPSULES BY MOUTH AT BEDTIME, Disp: 90 capsule, Rfl: 0   nystatin (MYCOSTATIN) 100000 UNIT/ML suspension, Take 5 mLs (500,000 Units total) by mouth 4 (four) times daily., Disp: 200 mL, Rfl: 0   omeprazole (PRILOSEC) 40 MG capsule, Take 1 capsule (40 mg total) by mouth 2 (two) times daily before a meal., Disp: 60 capsule, Rfl: 6   propranolol (INDERAL) 40 MG tablet, TAKE 1 TABLET BY MOUTH TWICE A DAY, Disp: 60 tablet, Rfl: 0   rizatriptan (MAXALT) 10 MG tablet, Take 1 tablet (10 mg total) by mouth 3 (three) times daily as needed., Disp: 10 tablet, Rfl: 11  Review of Systems:  Constitutional: Denies fever, chills, diaphoresis, appetite change and fatigue.  HEENT: Denies photophobia, eye pain, redness,  congestion, sore throat, rhinorrhea, sneezing, mouth sores, trouble swallowing, neck pain, neck stiffness and tinnitus.   Respiratory: Denies SOB, DOE, cough, chest tightness,  and wheezing.   Cardiovascular: Denies chest pain, palpitations and leg swelling.  Gastrointestinal: Denies nausea, vomiting, abdominal pain, diarrhea, constipation, blood in stool and abdominal distention.  Genitourinary: Denies dysuria, urgency, frequency, hematuria, flank pain and difficulty urinating.  Endocrine: Denies: hot or cold intolerance, sweats, changes in hair or nails, polyuria, polydipsia. Musculoskeletal: Denies myalgias, back pain, joint swelling, arthralgias and gait problem.  Skin: Denies pallor, rash and wound.  Neurological: Denies dizziness, seizures, syncope, weakness, light-headedness, numbness and headaches.  Hematological: Denies adenopathy. Easy bruising, personal or family bleeding  history  Psychiatric/Behavioral: Denies suicidal ideation, mood changes, confusion, nervousness, sleep disturbance and agitation    Physical Exam: Vitals:   09/25/20 1608  BP: 102/62  Pulse: 74  Temp: 98.5 F (36.9 C)  TempSrc: Oral  SpO2: 96%  Weight: 233 lb (105.7 kg)  Height: 5' 6"  (1.676 m)    Body mass index is 37.61 kg/m.   Constitutional: NAD, calm, comfortable Eyes: PERRL, lids and conjunctivae normal ENMT: Mucous membranes are moist. . Tympanic membrane is pearly white, no erythema or bulging on the left, right is obstructed by cerumen. NPsychiatric: Normal judgment and insight. Alert and oriented x 3. Normal mood.    Impression and Plan:  Impacted cerumen, right ear -Cerumen Desimpaction  After patient consent was obtained, warm water was applied and gentle ear lavage performed on right ear. There were no complications and following the desimpaction the tympanic membranes were visible. Tympanic membranes are intact following the procedure. Auditory canals are normal. The patient reported  relief of symptoms after removal of cerumen.     Lelon Frohlich, MD  Primary Care at [facility]

## 2020-09-30 ENCOUNTER — Ambulatory Visit (INDEPENDENT_AMBULATORY_CARE_PROVIDER_SITE_OTHER): Payer: Commercial Managed Care - PPO | Admitting: Neurology

## 2020-09-30 ENCOUNTER — Encounter: Payer: Self-pay | Admitting: Neurology

## 2020-09-30 VITALS — BP 102/60 | HR 70 | Ht 66.0 in | Wt 236.0 lb

## 2020-09-30 DIAGNOSIS — G43019 Migraine without aura, intractable, without status migrainosus: Secondary | ICD-10-CM

## 2020-09-30 MED ORDER — PROPRANOLOL HCL 40 MG PO TABS: 40.0000 mg | ORAL_TABLET | Freq: Two times a day (BID) | ORAL | 3 refills | Status: DC

## 2020-09-30 MED ORDER — NORTRIPTYLINE HCL 10 MG PO CAPS: 30.0000 mg | ORAL_CAPSULE | Freq: Every day | ORAL | 3 refills | Status: DC

## 2020-09-30 NOTE — Patient Instructions (Signed)
Continue current medications  Take Excedrin Migraine for acute headache  Will stop the Maxalt See you back in 1 year or sooner if needed

## 2020-09-30 NOTE — Progress Notes (Signed)
PATIENT: Laurie Sutton DOB: [birth year]  REASON FOR VISIT: follow up HISTORY FROM: patient  HISTORY OF PRESENT ILLNESS: Today 09/30/20  Laurie Sutton is a 40 year old female with history of migraine headaches.  She is on nortriptyline and propanolol.  Takes Maxalt. Reports 2-3 migraines a week. Reports Maxalt makes her stomach hurt. Will take Excedrin migraine, doesn't take often, works well. For a few days she missed propranolol, headaches much worse. Having hysterectomy in August. Right foot surgery this week for plantar fascitis. 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 Here today unaccompanied. Started nutrition and diet program, exercise, lost 19 lbs.   Update 07/11/2019 SS: Ms. [name] is a 40 year old female with history of migraine headaches.  She remains on nortriptyline and propanolol.  She takes Maxalt if needed.  Her headaches have increased as of lately due to poorly controlled diabetes, stress, and weather change.  She says she is having 2-3 migraines a month, 3-4 headaches a month, overall about 7-8 headache days a month.  About a month ago, says she had a headache that was behind her right eye, went to the eye doctor, was told everything was normal, was already on migraine preventative medications.  She does not really take Maxalt, will usually take Excedrin, maybe 2-3 times a month.  Her recent A1c was 9.  She is planning to have an endoscopy for GI issues.  She has not been wearing her glasses as much, due to fogging from the mask, thinks this may be contributing to her headaches.  She works part-time in a [employer].  She presents today for evaluation unaccompanied.  HISTORY HISTORY OF PRESENT ILLNESS:UPDATE 1/15/2020CM Ms. 23, 40 year old female returns for follow-up with history of migraine headaches.  She reports she has had 2 migraines in the last 6 months.  She woke up with a mild headache this morning however when checking her fasting blood sugar it was  170.  She has had some adjustments to her insulin dose.  Weather changes are a migraine trigger for her.  She really does not have any food triggers.  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  She has not missed any work.  She returns for reevaluation   REVIEW OF SYSTEMS: Out of a complete 14 system review of symptoms, the patient complains only of the following symptoms, and all other reviewed systems are negative.  Headache  ALLERGIES: Allergies  Allergen Reactions   Amoxicillin Hives and Itching   Azithromycin    Dulaglutide Other (See Comments)   Empagliflozin-Metformin Hcl Er Other (See Comments)   Latex    Penicillins Hives    Has patient had a PCN reaction causing immediate rash, facial/tongue/throat swelling, SOB or lightheadedness with hypotension: yes. Rash  Has patient had a PCN reaction causing severe rash involving mucus membranes or skin necrosis: Yes- rash and hives all over body  Has patient had a PCN reaction that required hospitalization No Has patient had a PCN reaction occurring within the last 10 years: Yes  If all of the above answers are "NO", then may proceed with Cephalosporin use.    Sitagliptin Other (See Comments)    Body aches all day Other reaction(s): Arthralgia (Joint Pain)   Sitagliptin-Metformin Hcl Other (See Comments)    HOME MEDICATIONS: Outpatient Medications Prior to Visit  Medication Sig Dispense Refill   ALPRAZolam (XANAX) 1 MG tablet TAKE 1 TABLET (1 MG TOTAL) BY MOUTH 2 (TWO) TIMES DAILY AS NEEDED FOR ANXIETY OR SLEEP. 60 tablet 5  BAYER CONTOUR NEXT TEST test strip USE AS INSTRUCTED TO CHECK BLOOD SUGAR ONCE A DAY DX CODE E11.9 50 each 3   BAYER MICROLET LANCETS lancets Use as instructed to check blood sugar once a day dx code E11.9 100 each 1   clotrimazole-betamethasone (LOTRISONE) cream Apply 1 application topically 2 (two) times daily as needed. 30 g 2   Continuous Blood Gluc Sensor (FREESTYLE LIBRE 14 DAY SENSOR) MISC Apply  topically every 14 (fourteen) days.     Cranberry 125 MG TABS Take by mouth.     FLUoxetine (PROZAC) 40 MG capsule Take 1 capsule (40 mg total) by mouth daily. 90 capsule 3   furosemide (LASIX) 20 MG tablet TAKE 1 TABLET BY MOUTH EVERY DAY 90 tablet 0   gabapentin (NEURONTIN) 100 MG capsule Take 1 capsule (100 mg total) by mouth 3 (three) times daily. (Patient taking differently: Take 100 mg by mouth 3 (three) times daily as needed.) 90 capsule 3   insulin glargine (LANTUS SOLOSTAR) 100 UNIT/ML Solostar Pen Inject 130 Units into the skin at bedtime.     insulin lispro (HUMALOG KWIKPEN) 100 UNIT/ML KwikPen Inject 50-70 Units into the skin with breakfast, with lunch, and with evening meal.     lamoTRIgine (LAMICTAL) 25 MG tablet Take 1 tablet (25 mg total) by mouth at bedtime. 30 tablet 3   metFORMIN (GLUCOPHAGE-XR) 500 MG 24 hr tablet Take 1,500 mg by mouth at bedtime.     montelukast (SINGULAIR) 10 MG tablet TAKE 1 TABLET BY MOUTH EVERYDAY AT BEDTIME 90 tablet 0   nystatin (MYCOSTATIN) 100000 UNIT/ML suspension Take 5 mLs (500,000 Units total) by mouth 4 (four) times daily. 200 mL 0   omeprazole (PRILOSEC) 40 MG capsule Take 1 capsule (40 mg total) by mouth 2 (two) times daily before a meal. 60 capsule 6   nortriptyline (PAMELOR) 10 MG capsule TAKE 3 CAPSULES BY MOUTH AT BEDTIME 90 capsule 0   propranolol (INDERAL) 40 MG tablet TAKE 1 TABLET BY MOUTH TWICE A DAY 60 tablet 0   rizatriptan (MAXALT) 10 MG tablet Take 1 tablet (10 mg total) by mouth 3 (three) times daily as needed. 10 tablet 11   insulin glargine (LANTUS) 100 UNIT/ML injection Inject 130 Units into the skin daily.     Insulin Regular Human (HUMULIN R U-500, CONCENTRATED, [place]) Inject into the skin. 190 units SQ morning, 160 units SQ afternoon, 150 units SQ dinner time     No facility-administered medications prior to visit.    PAST MEDICAL HISTORY: Past Medical History:  Diagnosis Date   Anxiety    Depression    Diabetes mellitus  without complication ([facility])    sees [name]    GERD (gastroesophageal reflux disease)    Gynecological examination    sees Dr. Josefa Half   Migraines    Murmur, cardiac     PAST SURGICAL HISTORY: Past Surgical History:  Procedure Laterality Date   CESAREAN SECTION      FAMILY HISTORY: Family History  Problem Relation Age of Onset   Hyperlipidemia Mother    Stroke Mother    Diabetes Mother    Migraines Mother    Diabetes Father    Migraines Maternal Aunt    Migraines Maternal Grandmother    Cerebral aneurysm Cousin    Diabetes Maternal Grandfather    Heart disease Neg Hx     SOCIAL HISTORY: Social History   Socioeconomic History   Marital status: Married    Spouse name: Not on  file   Number of children: 1   Years of education: Not on file   Highest education level: Not on file  Occupational History   Not on file  Tobacco Use   Smoking status: Never   Smokeless tobacco: Never  Substance and Sexual Activity   Alcohol use: No    Alcohol/week: 0.0 standard drinks   Drug use: No   Sexual activity: Yes    Birth control/protection: None  Other Topics Concern   Not on file  Social History Narrative   Not on file   Social Determinants of Health   Financial Resource Strain: Not on file  Food Insecurity: Not on file  Transportation Needs: Not on file  Physical Activity: Not on file  Stress: Not on file  Social Connections: Not on file  Intimate Partner Violence: Not on file   PHYSICAL EXAM  Vitals:   09/30/20 0854  BP: 102/60  Pulse: 70  Weight: 236 lb (107 kg)  Height: 5' 6"  (1.676 m)    Body mass index is 38.09 kg/m.  Generalized: Well developed, in no acute distress   Neurological examination  Mentation: Alert oriented to time, place, history taking. Follows all commands speech and language fluent Cranial nerve II-XII: Pupils were equal round reactive to light. Extraocular movements were full, visual field were full on confrontational test.  Facial sensation and strength were normal. Head turning and shoulder shrug  were normal and symmetric. Motor: The motor testing reveals 5 over 5 strength of all 4 extremities. Good symmetric motor tone is noted throughout.  Sensory: Sensory testing is intact to soft touch on all 4 extremities. No evidence of extinction is noted.  Coordination: Cerebellar testing reveals good finger-nose-finger and heel-to-shin bilaterally.  Gait and station: Gait is normal.  Reflexes: Deep tendon reflexes are symmetric and normal bilaterally.   DIAGNOSTIC DATA (LABS, IMAGING, TESTING) - I reviewed patient records, labs, notes, testing and imaging myself where available.  Lab Results  Component Value Date   WBC 4.8 08/25/2020   HGB 14.2 08/25/2020   HCT 41.3 08/25/2020   MCV 93.9 08/25/2020   PLT 102.0 Repeated and verified X2. (L) 08/25/2020      Component Value Date/Time   NA 135 08/25/2020 1543   NA 138 08/19/2017 0000   K 3.2 (L) 08/25/2020 1543   CL 97 08/25/2020 1543   CO2 26 08/25/2020 1543   GLUCOSE 209 (H) 08/25/2020 1543   BUN 12 08/25/2020 1543   BUN 8 08/19/2017 0000   CREATININE 0.56 08/25/2020 1543   CALCIUM 9.8 08/25/2020 1543   PROT 7.3 08/25/2020 1543   ALBUMIN 4.3 08/25/2020 1543   AST 32 08/25/2020 1543   ALT 31 08/25/2020 1543   ALKPHOS 112 08/25/2020 1543   BILITOT 0.8 08/25/2020 1543   GFRNONAA >60 08/09/2019 0459   GFRAA >60 08/09/2019 0459   Lab Results  Component Value Date   CHOL 169 08/19/2017   HDL 38 08/19/2017   LDLCALC 91 08/19/2017   TRIG 202 (A) 08/19/2017   CHOLHDL 4 05/12/2015   Lab Results  Component Value Date   HGBA1C 9.6 04/27/2019   Lab Results  Component Value Date   URKYHCWC37 628 04/27/2019   Lab Results  Component Value Date   TSH 0.75 06/13/2019    ASSESSMENT AND PLAN 39 y.o. year old female  has a past medical history of Anxiety, Depression, Diabetes mellitus without complication ([place]), GERD (gastroesophageal reflux disease),  Gynecological examination, Migraines, and Murmur, cardiac. here with:  1.  Chronic migraine headache  -Currently well controlled -Continue propanolol 40 mg twice daily -Continue nortriptyline 30 mg at bedtime -Will take Excedrin Migraine for acute headache, will d/c Maxalt due to adverse effect, can try another triptan if needed -Follow-up in 1 year or sooner if needed  Evangeline Dakin, DNP 09/30/2020, 9:36 AM [facility] [address], [place], [place] [address] [phone]

## 2020-09-30 NOTE — Progress Notes (Signed)
I have read the note, and I agree with the clinical assessment and plan.  Latisia Hilaire K Melodee Lupe   

## 2020-10-01 ENCOUNTER — Telehealth: Payer: Self-pay | Admitting: Gastroenterology

## 2020-10-01 NOTE — Telephone Encounter (Signed)
Inbound call from patient. Inquiring about results for ultrasound on liver. Best contact number [phone]

## 2020-10-01 NOTE — Telephone Encounter (Signed)
Laurie Banister, MD  09/29/2020  7:44 AM EDT      Dr. Sarajane Jews, [facility] I am not sure why her platelets are a bit low but I do not believethat it is from chronic liver disease.         The liver ultrasound with elastography shows that your liver is overall bigger than usual but otherwise normal.  There is no clear sign of cirrhosis or scartissue in your liver.  Attempted to call pt but received message that her voice mailbox if full and not accepting message.

## 2020-10-02 ENCOUNTER — Encounter: Payer: Self-pay | Admitting: Podiatry

## 2020-10-02 ENCOUNTER — Other Ambulatory Visit: Payer: Self-pay | Admitting: Podiatry

## 2020-10-02 DIAGNOSIS — M722 Plantar fascial fibromatosis: Secondary | ICD-10-CM

## 2020-10-02 DIAGNOSIS — L600 Ingrowing nail: Secondary | ICD-10-CM

## 2020-10-02 HISTORY — PX: FOOT SURGERY: SHX648

## 2020-10-02 MED ORDER — IBUPROFEN 800 MG PO TABS: 800.0000 mg | ORAL_TABLET | Freq: Three times a day (TID) | ORAL | 1 refills | Status: DC

## 2020-10-02 MED ORDER — OXYCODONE-ACETAMINOPHEN 5-325 MG PO TABS: 1.0000 | ORAL_TABLET | ORAL | 0 refills | Status: DC | PRN

## 2020-10-02 NOTE — Progress Notes (Signed)
PRN postop 

## 2020-10-03 NOTE — Telephone Encounter (Signed)
Lm on vm for patient to return call 

## 2020-10-06 NOTE — Telephone Encounter (Signed)
I have left a detailed message for the pt with test results.  I have asked her to call back if she has any further questions or concerns.

## 2020-10-06 NOTE — Telephone Encounter (Signed)
Left message on machine to call back  

## 2020-10-08 ENCOUNTER — Ambulatory Visit (INDEPENDENT_AMBULATORY_CARE_PROVIDER_SITE_OTHER): Payer: Commercial Managed Care - PPO | Admitting: Podiatry

## 2020-10-08 ENCOUNTER — Other Ambulatory Visit: Payer: Self-pay

## 2020-10-08 DIAGNOSIS — Z9889 Other specified postprocedural states: Secondary | ICD-10-CM

## 2020-10-08 NOTE — Progress Notes (Signed)
   Subjective:  Patient presents today status post endoscopic plantar fasciotomy right with partial permanent nail avulsion lateral border right great toe. DOS: 10/02/2020.  Patient states that she is doing well.  She does have some tenderness and sensitivity to the area.  She does admit to ambulating significantly throughout the week.  Otherwise patient is doing well with no new complaints at this time  Past Medical History:  Diagnosis Date   Anxiety    Depression    Diabetes mellitus without complication ([place])    sees [name]    GERD (gastroesophageal reflux disease)    Gynecological examination    sees Dr. Josefa Half   Migraines    Murmur, cardiac       Objective/Physical Exam Neurovascular status intact.  Skin incisions appear to be well coapted with sutures intact. No sign of infectious process noted. No dehiscence. No active bleeding noted. Moderate edema noted to the surgical extremity.   Assessment: 1. s/p endoscopic plantar fasciotomy right.  Partial permanent nail matricectomy right lateral hallux. DOS: 10/02/2020   Plan of Care:  1. Patient was evaluated. 2.  Dressings changed today.  Recommend antibiotic ointment and a Band-Aid to the incision sites 3.  Ace wrap applied.  Recommend Ace wrap daily.  Additional Ace wraps were provided 4.  Continue weightbearing in the cam boot 5.  Return to clinic in 1 week  *Patient is going to schedule a hysterectomy shortly after foot surgery in August. *Works in [employer] at [employer].  Daughter, Ava, in 7th grade at [employer].    Edrick Kins, DPM [facility]  Dr. Edrick Kins, DPM    [address], [place] [address]                Office [phone]  Fax [phone]

## 2020-10-15 ENCOUNTER — Other Ambulatory Visit: Payer: Self-pay | Admitting: Family Medicine

## 2020-10-15 ENCOUNTER — Encounter: Payer: Self-pay | Admitting: Family Medicine

## 2020-10-15 ENCOUNTER — Other Ambulatory Visit: Payer: Self-pay

## 2020-10-15 ENCOUNTER — Ambulatory Visit (INDEPENDENT_AMBULATORY_CARE_PROVIDER_SITE_OTHER): Payer: Commercial Managed Care - PPO | Admitting: Podiatry

## 2020-10-15 DIAGNOSIS — Z9889 Other specified postprocedural states: Secondary | ICD-10-CM

## 2020-10-15 DIAGNOSIS — L600 Ingrowing nail: Secondary | ICD-10-CM

## 2020-10-15 MED ORDER — GENTAMICIN SULFATE 0.1 % EX CREA: 1.0000 "application " | TOPICAL_CREAM | Freq: Two times a day (BID) | CUTANEOUS | 1 refills | Status: DC

## 2020-10-15 MED ORDER — DOXYCYCLINE HYCLATE 100 MG PO TABS: 100.0000 mg | ORAL_TABLET | Freq: Two times a day (BID) | ORAL | 0 refills | Status: DC

## 2020-10-15 NOTE — Progress Notes (Signed)
   Subjective:  Patient presents today status post endoscopic plantar fasciotomy right with partial permanent nail avulsion lateral border right great toe. DOS: 10/02/2020.  Patient states that over the past week she has had an increase amount of pain and sensitivity.  She has been weightbearing in the cam boot as instructed.  Past Medical History:  Diagnosis Date   Anxiety    Depression    Diabetes mellitus without complication ([place])    sees [name]    GERD (gastroesophageal reflux disease)    Gynecological examination    sees Dr. Josefa Half   Migraines    Murmur, cardiac       Objective/Physical Exam Neurovascular status intact.  Skin incisions appear to be well coapted with sutures intact. No sign of infectious process noted. No dehiscence. No active bleeding noted. Moderate edema noted to the surgical extremity.   Assessment: 1. s/p endoscopic plantar fasciotomy right.  Partial permanent nail matricectomy right lateral hallux. DOS: 10/02/2020   Plan of Care:  1. Patient was evaluated. 2.  Due to the increased drainage and erythema around the nail matricectomy site I went ahead and prescribe doxycycline 100 mg 2 times daily #20 3.  Prescription for gentamicin cream 4.  Continue Ace wrap daily 5.  Patient may begin to transition out of the cam boot into good supportive sneakers 6.  Return to clinic in 4 weeks  *Patient is going to schedule a hysterectomy shortly after foot surgery in August. *Works in [employer] at [employer].  Daughter, Ava, in 7th grade at [employer].    Edrick Kins, DPM [facility]  Dr. Edrick Kins, DPM    [address], [place] [address]                Office [phone]  Fax [phone]

## 2020-10-16 IMAGING — DX DG CHEST 1V PORT
1 series · 1 of 1 positions shown · non-contrast
Comparison: None.

CLINICAL DATA: COVID positive

EXAM:
PORTABLE CHEST 1 VIEW

[chest ap]
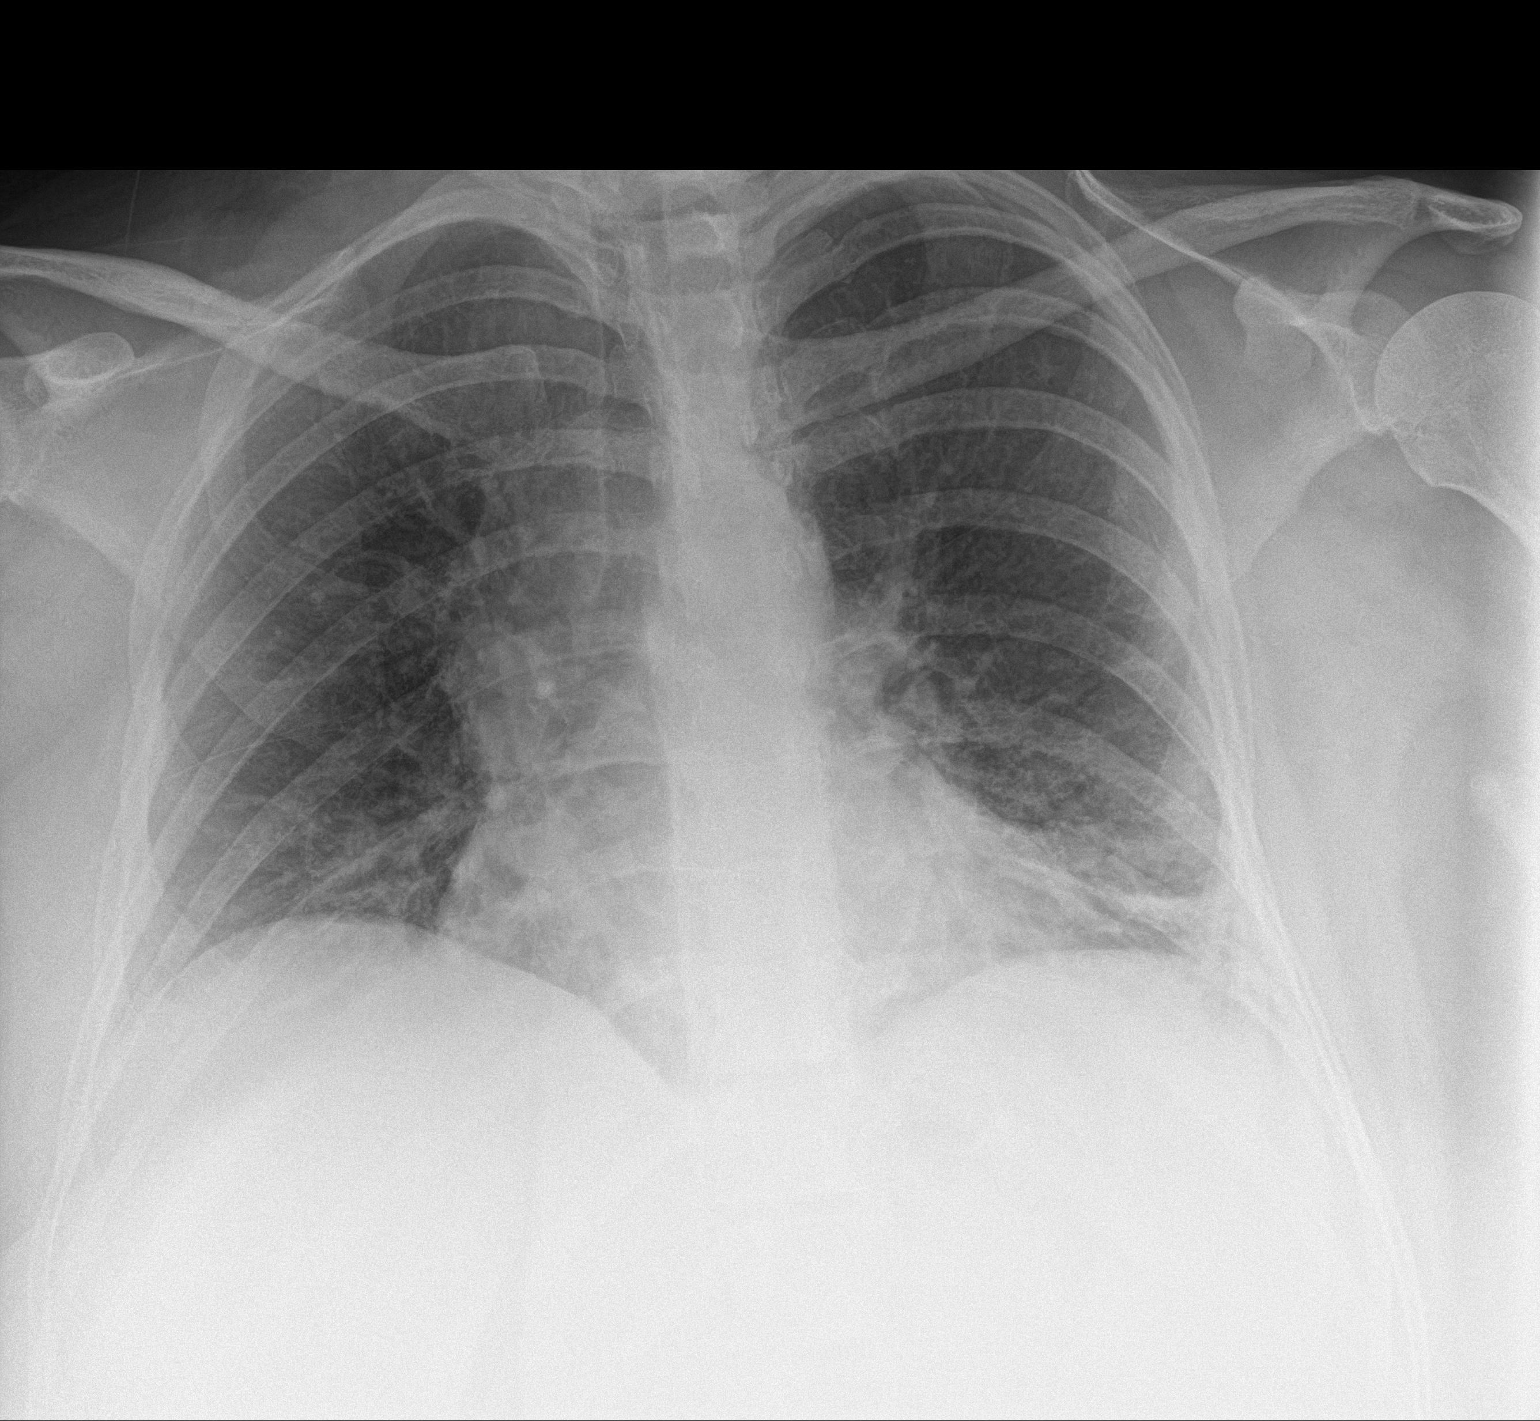

[1 of 1 positions shown; findings below may reference images not displayed]

FINDINGS: The heart size and mediastinal contours are within normal limits.
Streaky patchy airspace opacity seen at the left lung base. No
pleural effusion. No acute osseous abnormality.
IMPRESSION: Streaky patchy airspace opacity at the left lung base, likely early
pneumonia.

## 2020-10-20 ENCOUNTER — Encounter: Payer: Self-pay | Admitting: Podiatry

## 2020-10-23 ENCOUNTER — Other Ambulatory Visit: Payer: Self-pay | Admitting: Family Medicine

## 2020-10-29 ENCOUNTER — Ambulatory Visit (INDEPENDENT_AMBULATORY_CARE_PROVIDER_SITE_OTHER): Payer: Commercial Managed Care - PPO | Admitting: Podiatry

## 2020-10-29 ENCOUNTER — Encounter: Payer: Self-pay | Admitting: Podiatry

## 2020-10-29 ENCOUNTER — Other Ambulatory Visit: Payer: Self-pay

## 2020-10-29 DIAGNOSIS — Z9889 Other specified postprocedural states: Secondary | ICD-10-CM

## 2020-10-29 NOTE — Progress Notes (Signed)
   Subjective:  Patient presents today status post endoscopic plantar fasciotomy right with partial permanent nail avulsion lateral border right great toe. DOS: 10/02/2020.  Patient states that she is doing well.  No new complaints at this time  Past Medical History:  Diagnosis Date   Anxiety    Depression    Diabetes mellitus without complication ([place])    sees [name]    GERD (gastroesophageal reflux disease)    Gynecological examination    sees Dr. Josefa Half   Migraines    Murmur, cardiac       Objective/Physical Exam Neurovascular status intact.  Skin incisions appear to be well coapted with sutures intact. No sign of infectious process noted. No dehiscence. No active bleeding noted. Moderate edema noted to the surgical extremity.   Assessment: 1. s/p endoscopic plantar fasciotomy right.  Partial permanent nail matricectomy right lateral hallux. DOS: 10/02/2020   Plan of Care:  1. Patient was evaluated. 2.  Patient doing much better.  Light debridement of the nail avulsion matricectomy site was performed today.  It appears to be healing with no clinical evidence of infection today 3.  Patient may begin to transition out of the cam boot into good supportive sneakers and shoes 4.  Return to clinic 6 weeks  *Patient is going to schedule a hysterectomy shortly after foot surgery in August. *Works in [employer] at [employer].  Daughter, Ava, in 7th grade at [employer].    Edrick Kins, DPM [facility]  Dr. Edrick Kins, DPM    [address], [place] [address]                Office [phone]  Fax [phone]

## 2020-10-30 NOTE — Telephone Encounter (Signed)
I'm confused about this. She picked up the 1 mg Xanax on 10-13-20, so why does she need 0.5 mg?

## 2020-11-03 NOTE — Telephone Encounter (Signed)
Pt stated she was suppose to take the .37m PRN but the tablets was sent in as 1721m Pt advised to break the tablet in half at score line. Pt also advised that I will send note to Dr.Fry to see if he can redo the Rx to reflect .21m9mnd not 1mg23mt verbalized understanding.

## 2020-11-04 NOTE — Telephone Encounter (Signed)
Yes that is what I intended. She can take 1/2 a tablet as needed. I cannot prescribe 2 different doses of the medication at the same time

## 2020-11-11 ENCOUNTER — Encounter (HOSPITAL_BASED_OUTPATIENT_CLINIC_OR_DEPARTMENT_OTHER): Payer: Self-pay | Admitting: Obstetrics & Gynecology

## 2020-11-11 ENCOUNTER — Other Ambulatory Visit: Payer: Self-pay

## 2020-11-11 DIAGNOSIS — N920 Excessive and frequent menstruation with regular cycle: Secondary | ICD-10-CM

## 2020-11-11 DIAGNOSIS — E1140 Type 2 diabetes mellitus with diabetic neuropathy, unspecified: Secondary | ICD-10-CM

## 2020-11-11 HISTORY — DX: Type 2 diabetes mellitus with diabetic neuropathy, unspecified: E11.40

## 2020-11-11 HISTORY — DX: Excessive and frequent menstruation with regular cycle: N92.0

## 2020-11-11 NOTE — Progress Notes (Signed)
Spoke w/ via phone for pre-op interview---PT Lab needs dos----        urine preg       Lab results------lab appt 11-14-2020@1315  for cbc, cmp t &s, ekg COVID test -----11-14-2020 (overnight stay) Arrive at -------530 am 11-17-2020 NPO after MN NO Solid Food.  Clear liquids from MN until---430 am then npo Med rec completed Medications to take morning of surgery -----alprazolam, prn, fluoxetine, omeprazole, propranolol,  Diabetic medication -----take 1/2 dose of hs lantus night before surgery (take 65 units) Patient instructed no nail polish to be worn day of surgery Patient instructed to bring photo id and insurance card day of surgery Patient aware to have Driver (ride ) / caregiver   spouse adam will stay  for 24 hours after surgery  Patient Special Instructions -----pt given overnight stay instructions Pre-Op special Istructions -----none Patient verbalized understanding of instructions that were given at this phone interview. Patient denies shortness of breath, chest pain, fever, cough at this phone interview.   Lov neurology (migraines) sarah slack np 08-12-2019 2 epic Chest ct 08-12-2019 epic (pt had covid pneumonia, all symptoms resolved per pt) Cbg in low 200's per pt.  Pt will have freestyle libre on right arm

## 2020-11-11 NOTE — Progress Notes (Signed)
YOU ARE SCHEDULED FOR A COVID TEST ON   11-14-2020  . THIS TEST MUST BE DONE BEFORE SURGERY. GO TO  [facility] [place] IN YOUR CAR, THIS IS A DRIVE UP TEST.       Your procedure is scheduled on 11-17-2020  Report to [place] AM  530 A. M.   Call this number if you have problems the morning of surgery  [phone].   OUR ADDRESS IS [address].  WE ARE LOCATED IN [facility].  PLEASE BRING YOUR INSURANCE CARD AND PHOTO ID DAY OF SURGERY.  ONLY ONE PERSON ALLOWED IN FACILITY WAITING AREA.                                     REMEMBER:  DO NOT EAT FOOD, CANDY GUM OR MINTS  AFTER MIDNIGHT . YOU MAY HAVE CLEAR LIQUIDS FROM MIDNIGHT UNTIL 430 AM. NO CLEAR LIQUIDS AFTER 430 AM DAY OF SURGERY.   YOU MAY  BRUSH YOUR TEETH MORNING OF SURGERY AND RINSE YOUR MOUTH OUT, NO CHEWING GUM CANDY OR MINTS.    CLEAR LIQUID DIET   Foods Allowed                                                                     Foods Excluded  Coffee and tea, regular and decaf                             liquids that you cannot  Plain Jell-O any favor except red or purple                                           see through such as: Fruit ices (not with fruit pulp)                                     milk, soups, orange juice  Iced Popsicles                                    All solid food Carbonated beverages, regular and diet                                    Cranberry, grape and apple juices Sports drinks like Gatorade Lightly seasoned clear broth or consume(fat free) Sugar, honey syrup  Sample Menu Breakfast                                Lunch  Supper Cranberry juice                    Beef broth                            Chicken broth Jell-O                                     Grape juice                           Apple juice Coffee or tea                        Jell-O                                       Popsicle                                                Coffee or tea                        Coffee or tea  _____________________________________________________________________     TAKE THESE MEDICATIONS MORNING OF SURGERY WITH A SIP OF WATER: ALPRAZOLAM IF NEEDED, GABAPENTIN IF NEEDED,FLUOXETINE, OMEPRAZOLE, PROPRANOLOL.Marland Kitchen  PLEASE TAKE 1/2 OF YOUR BEDTIME DOSE OF LANTUS  INSULIN NIGHT BEFORE SURGERY (TAKE 65 UNITS) ON 11-16-2020.  YOU MAY LEAVE YOUR FREESTYLE LIBRE ON YOUR RIGHT ARM  ONE VISITOR IS ALLOWED IN WAITING ROOM ONLY DAY OF SURGERY.  NO VISITOR MAY SPEND THE NIGHT.  VISITOR ARE ALLOWED TO STAY UNTIL 800 PM.                                    DO NOT WEAR JEWERLY, MAKE UP. DO NOT WEAR LOTIONS, POWDERS, PERFUMES OR NAIL POLISH. DO NOT SHAVE FOR 48 HOURS PRIOR TO DAY OF SURGERY. MEN MAY SHAVE FACE AND NECK. CONTACTS, GLASSES, OR DENTURES MAY NOT BE WORN TO SURGERY.                                    [facility] IS NOT RESPONSIBLE  FOR ANY BELONGINGS.                                                                    Marland Kitchen           [facility] - Preparing for Surgery Before surgery, you can play an important role.  Because skin is not sterile, your skin needs to be as free of germs as possible.  You can reduce the number of germs on your skin by washing with CHG (chlorahexidine gluconate) soap before surgery.  CHG is an  antiseptic cleaner which kills germs and bonds with the skin to continue killing germs even after washing. Please DO NOT use if you have an allergy to CHG or antibacterial soaps.  If your skin becomes reddened/irritated stop using the CHG and inform your nurse when you arrive at Short Stay. Do not shave (including legs and underarms) for at least 48 hours prior to the first CHG shower.  You may shave your face/neck. Please follow these instructions carefully:  1.  Shower with CHG Soap the night before surgery and the  morning of  Surgery.  2.  If you choose to wash your hair, wash your hair first as usual with your  normal  shampoo.  3.  After you shampoo, rinse your hair and body thoroughly to remove the  shampoo.                            4.  Use CHG as you would any other liquid soap.  You can apply chg directly  to the skin and wash                      Gently with a scrungie or clean washcloth.  5.  Apply the CHG Soap to your body ONLY FROM THE NECK DOWN.   Do not use on face/ open                           Wound or open sores. Avoid contact with eyes, ears mouth and genitals (private parts).                       Wash face,  Genitals (private parts) with your normal soap.             6.  Wash thoroughly, paying special attention to the area where your surgery  will be performed.  7.  Thoroughly rinse your body with warm water from the neck down.  8.  DO NOT shower/wash with your normal soap after using and rinsing off  the CHG Soap.                9.  Pat yourself dry with a clean towel.            10.  Wear clean pajamas.            11.  Place clean sheets on your bed the night of your first shower and do not  sleep with pets. Day of Surgery : Do not apply any lotions/deodorants the morning of surgery.  Please wear clean clothes to the hospital/surgery center.  FAILURE TO FOLLOW THESE INSTRUCTIONS MAY RESULT IN THE CANCELLATION OF YOUR SURGERY PATIENT SIGNATURE_________________________________  NURSE SIGNATURE__________________________________  ________________________________________________________________________                                                        QUESTIONS Hansel Feinstein PRE OP NURSE PHONE [phone].

## 2020-11-13 ENCOUNTER — Other Ambulatory Visit: Payer: Self-pay | Admitting: Obstetrics & Gynecology

## 2020-11-13 ENCOUNTER — Ambulatory Visit
Admission: RE | Admit: 2020-11-13 | Discharge: 2020-11-13 | Disposition: A | Discharge status: Home / Self Care | Payer: Commercial Managed Care - PPO | Source: Ambulatory Visit | Attending: Obstetrics & Gynecology | Admitting: Obstetrics & Gynecology

## 2020-11-13 DIAGNOSIS — T8332XA Displacement of intrauterine contraceptive device, initial encounter: Secondary | ICD-10-CM

## 2020-11-14 ENCOUNTER — Encounter (HOSPITAL_COMMUNITY)
Admission: RE | Admit: 2020-11-14 | Discharge: 2020-11-14 | Disposition: A | Discharge status: Home / Self Care | Payer: Commercial Managed Care - PPO | Source: Ambulatory Visit | Attending: Obstetrics & Gynecology | Admitting: Obstetrics & Gynecology

## 2020-11-14 ENCOUNTER — Other Ambulatory Visit: Payer: Self-pay | Admitting: Obstetrics & Gynecology

## 2020-11-14 ENCOUNTER — Other Ambulatory Visit: Payer: Self-pay

## 2020-11-14 DIAGNOSIS — Z20822 Contact with and (suspected) exposure to covid-19: Secondary | ICD-10-CM | POA: Diagnosis not present

## 2020-11-14 DIAGNOSIS — Z01818 Encounter for other preprocedural examination: Secondary | ICD-10-CM | POA: Insufficient documentation

## 2020-11-14 LAB — CBC
HCT: 40.5 % (ref 36.0–46.0)
Hemoglobin: 14 g/dL (ref 12.0–15.0)
MCH: 30.9 pg (ref 26.0–34.0)
MCHC: 34.6 g/dL (ref 30.0–36.0)
MCV: 89.4 fL (ref 80.0–100.0)
Platelets: 105 10*3/uL — ABNORMAL LOW (ref 150–400)
RBC: 4.53 MIL/uL (ref 3.87–5.11)
RDW: 12.3 % (ref 11.5–15.5)
WBC: 3.3 10*3/uL — ABNORMAL LOW (ref 4.0–10.5)
nRBC: 0 % (ref 0.0–0.2)

## 2020-11-14 LAB — COMPREHENSIVE METABOLIC PANEL
ALT: 40 U/L (ref 0–44)
AST: 57 U/L — ABNORMAL HIGH (ref 15–41)
Albumin: 3.9 g/dL (ref 3.5–5.0)
Alkaline Phosphatase: 86 U/L (ref 38–126)
Anion gap: 7 (ref 5–15)
BUN: 6 mg/dL (ref 6–20)
CO2: 25 mmol/L (ref 22–32)
Calcium: 9.1 mg/dL (ref 8.9–10.3)
Chloride: 103 mmol/L (ref 98–111)
Creatinine, Ser: 0.45 mg/dL (ref 0.44–1.00)
GFR, Estimated: >60 mL/min (ref >60)
Glucose, Bld: 252 mg/dL — ABNORMAL HIGH (ref 70–99)
Potassium: 3.4 mmol/L — ABNORMAL LOW (ref 3.5–5.1)
Sodium: 135 mmol/L (ref 135–145)
Total Bilirubin: 1 mg/dL (ref 0.3–1.2)
Total Protein: 7.1 g/dL (ref 6.5–8.1)

## 2020-11-14 NOTE — Progress Notes (Signed)
Routed abnormal CMP result done 11-14-2020 in epic to Dr Lynnette Caffey.

## 2020-11-14 NOTE — H&P (Signed)
Laurie Sutton is an 40 y.o. female G2P2 with heavy menstrual bleeding.  She had Mirena placed 08/2019 and initially did well but HMB returned in 06/2020.  U/S showed partial expulsion of IUD and it was removed.  Patient declined surgery and TXA at that time and a second IUD was placed.  She continued to have HMB and expelled the second Mirena.  She had symptomatic anemia (Hgb 8.8) and received iron infusion.  Patient declines hormonal tx and has completed child-bearing.  Last u/s on 5/26 showed uterine volume 134 cc with EMS 2.7 mm.  No adnexal mass.  She presents today for definitive management of HMB. Of note, patient is diabetic on insulin and metformin.  Pertinent Gynecological History: Menses: flow is excessive with use of 10 pads or tampons on heaviest days Bleeding: dysfunctional uterine bleeding Contraception: abstinence DES exposure: unknown Blood transfusions: none Sexually transmitted diseases: no past history Previous GYN Procedures:  C/S   Last mammogram:  n/a  Date: n/a Last pap: normal Date: 12/07/16 OB History: G2, P2   Menstrual History: Menarche age: n/a Patient's last menstrual period was 09/24/2020 (approximate).    Past Medical History:  Diagnosis Date   Anemia    IRON TRANSFUSION 07-2020 NONE SINCE   Anxiety    COVID 08/12/2019   ALL SYMPTOMS REOLVED PER PT   Depression    Diabetic neuropathy ([place]) 11/11/2020   FEET   dm type 2    sees [name]    GERD (gastroesophageal reflux disease)    History of kidney stones    Menorrhagia 11/11/2020   Migraines    Murmur, cardiac    FAINT NO CARDIOLOGIST   Pneumonia 08/12/2019   COVID PNEUMONIA ALL SYMPTOMS RESOLVED PER PT    Past Surgical History:  Procedure Laterality Date   CESAREAN SECTION  12/13/2006   EXTRACORPOREAL SHOCK WAVE LITHOTRIPSY  2018   FOOT SURGERY Right 10/02/2020   RIGHT FOOT HEEL AND TOES, SUR[facility] OFF [address]    Family History  Problem Relation Age of Onset   Hyperlipidemia  Mother    Stroke Mother    Diabetes Mother    Migraines Mother    Diabetes Father    Migraines Maternal Aunt    Migraines Maternal Grandmother    Cerebral aneurysm Cousin    Diabetes Maternal Grandfather    Heart disease Neg Hx     Social History:  reports that she has never smoked. She has never used smokeless tobacco. She reports that she does not drink alcohol and does not use drugs.  Allergies:  Allergies  Allergen Reactions   Amoxicillin Hives and Itching   Azithromycin     DOES NOT WORK   Dulaglutide Other (See Comments)    JANUVIA STOAMCH HURT?   Latex     IRRITATES VAGINAL AREA   Penicillins Hives    Has patient had a PCN reaction causing immediate rash, facial/tongue/throat swelling, SOB or lightheadedness with hypotension: yes. Rash  Has patient had a PCN reaction causing severe rash involving mucus membranes or skin necrosis: Yes- rash and hives all over body  Has patient had a PCN reaction that required hospitalization No Has patient had a PCN reaction occurring within the last 10 years: Yes  If all of the above answers are "NO", then may proceed with Cephalosporin use.    Sitagliptin Other (See Comments)    HURTS STOAMCH    No medications prior to admission.    Review of Systems  Height  5' 6"  (1.676 m), weight 104.3 kg, last menstrual period 09/24/2020. Physical Exam Constitutional:      Appearance: Normal appearance.  HENT:     Head: Normocephalic and atraumatic.  Pulmonary:     Effort: Pulmonary effort is normal.  Abdominal:     Palpations: Abdomen is soft.  Genitourinary:    General: Normal vulva.  Musculoskeletal:        General: Normal range of motion.     Cervical back: Normal range of motion.  Skin:    General: Skin is warm and dry.  Neurological:     General: No focal deficit present.     Mental Status: She is alert and oriented to person, place, and time.  Psychiatric:        Mood and Affect: Mood normal.        Behavior: Behavior  normal.    No results found for this or any previous visit (from the past 24 hour(s)).  No results found.  Assessment/Plan: 40yo G2P2 with HMB -Plan LAVH, BS -Patient has been counseled re: risk of bleeding, infection, scarring and damage to surrounding structures.  She is informed of the steps of the procedure as well as postop expectations and limitations.  She is informed of the possibility of laparotomy if unable to complete procedure laparoscopically.  All questions were answered and patient wishes to proceed.  Linda Hedges 11/14/2020, 7:13 AM

## 2020-11-15 LAB — SARS CORONAVIRUS 2 (TAT 6-24 HRS): SARS Coronavirus 2: NEGATIVE

## 2020-11-15 NOTE — Anesthesia Preprocedure Evaluation (Addendum)
Anesthesia Evaluation  Patient identified by MRN, date of birth, ID band Patient awake    Reviewed: Allergy & Precautions, NPO status , Patient's Chart, lab work & pertinent test results  History of Anesthesia Complications Negative for: history of anesthetic complications  Airway Mallampati: III  TM Distance: >3 FB Neck ROM: Full    Dental no notable dental hx. (+) Dental Advisory Given   Pulmonary neg pulmonary ROS,    Pulmonary exam normal        Cardiovascular negative cardio ROS Normal cardiovascular exam     Neuro/Psych  Headaches, PSYCHIATRIC DISORDERS Anxiety Depression    GI/Hepatic Neg liver ROS, GERD  ,  Endo/Other  diabetes  Renal/GU negative Renal ROS     Musculoskeletal negative musculoskeletal ROS (+)   Abdominal   Peds  Hematology negative hematology ROS (+)   Anesthesia Other Findings   Reproductive/Obstetrics                            Anesthesia Physical Anesthesia Plan  ASA: 2  Anesthesia Plan: General   Post-op Pain Management:    Induction: Intravenous  PONV Risk Score and Plan: 4 or greater and Ondansetron, Dexamethasone, Midazolam and Scopolamine patch - Pre-op  Airway Management Planned: Oral ETT  Additional Equipment:   Intra-op Plan:   Post-operative Plan: Extubation in OR  Informed Consent: I have reviewed the patients History and Physical, chart, labs and discussed the procedure including the risks, benefits and alternatives for the proposed anesthesia with the patient or authorized representative who has indicated his/her understanding and acceptance.     Dental advisory given  Plan Discussed with: Anesthesiologist  Anesthesia Plan Comments:        Anesthesia Quick Evaluation

## 2020-11-17 ENCOUNTER — Encounter (HOSPITAL_BASED_OUTPATIENT_CLINIC_OR_DEPARTMENT_OTHER): Payer: Self-pay | Admitting: Obstetrics & Gynecology

## 2020-11-17 ENCOUNTER — Observation Stay (HOSPITAL_BASED_OUTPATIENT_CLINIC_OR_DEPARTMENT_OTHER)
Admission: RE | Admit: 2020-11-17 | Discharge: 2020-11-18 | Disposition: A | Discharge status: Home / Self Care | Payer: Commercial Managed Care - PPO | Attending: Obstetrics & Gynecology | Admitting: Obstetrics & Gynecology

## 2020-11-17 ENCOUNTER — Ambulatory Visit (HOSPITAL_BASED_OUTPATIENT_CLINIC_OR_DEPARTMENT_OTHER): Payer: Commercial Managed Care - PPO | Admitting: Anesthesiology

## 2020-11-17 ENCOUNTER — Encounter (HOSPITAL_BASED_OUTPATIENT_CLINIC_OR_DEPARTMENT_OTHER)
Admission: RE | Disposition: A | Discharge status: Home / Self Care | Payer: Self-pay | Source: Home / Self Care | Attending: Obstetrics & Gynecology

## 2020-11-17 ENCOUNTER — Other Ambulatory Visit: Payer: Self-pay

## 2020-11-17 DIAGNOSIS — N920 Excessive and frequent menstruation with regular cycle: Secondary | ICD-10-CM | POA: Diagnosis present

## 2020-11-17 DIAGNOSIS — Z90710 Acquired absence of both cervix and uterus: Secondary | ICD-10-CM

## 2020-11-17 DIAGNOSIS — Z91040 Latex allergy status: Secondary | ICD-10-CM | POA: Diagnosis not present

## 2020-11-17 DIAGNOSIS — N800 Endometriosis of uterus: Secondary | ICD-10-CM | POA: Diagnosis not present

## 2020-11-17 DIAGNOSIS — Z8616 Personal history of COVID-19: Secondary | ICD-10-CM | POA: Insufficient documentation

## 2020-11-17 DIAGNOSIS — N72 Inflammatory disease of cervix uteri: Principal | ICD-10-CM | POA: Insufficient documentation

## 2020-11-17 DIAGNOSIS — E119 Type 2 diabetes mellitus without complications: Secondary | ICD-10-CM | POA: Insufficient documentation

## 2020-11-17 HISTORY — DX: Personal history of urinary calculi: Z87.442

## 2020-11-17 HISTORY — PX: LAPAROSCOPIC VAGINAL HYSTERECTOMY WITH SALPINGECTOMY: SHX6680

## 2020-11-17 HISTORY — DX: Anemia, unspecified: D64.9

## 2020-11-17 LAB — GLUCOSE, CAPILLARY
Glucose-Capillary: 139 mg/dL — ABNORMAL HIGH (ref 70–99)
Glucose-Capillary: 238 mg/dL — ABNORMAL HIGH (ref 70–99)
Glucose-Capillary: 341 mg/dL — ABNORMAL HIGH (ref 70–99)
Glucose-Capillary: 279 mg/dL — ABNORMAL HIGH (ref 70–99)

## 2020-11-17 LAB — TYPE AND SCREEN
ABO/RH(D): O POS
Antibody Screen: NEGATIVE

## 2020-11-17 LAB — ABO/RH: ABO/RH(D): O POS

## 2020-11-17 LAB — POCT PREGNANCY, URINE: Preg Test, Ur: NEGATIVE

## 2020-11-17 SURGERY — HYSTERECTOMY, VAGINAL, LAPAROSCOPY-ASSISTED, WITH SALPINGECTOMY
Anesthesia: General | Site: Abdomen | Laterality: Bilateral

## 2020-11-17 MED ORDER — GABAPENTIN 100 MG PO CAPS
ORAL_CAPSULE | ORAL | Status: AC
  Filled 2020-11-17: qty 1

## 2020-11-17 MED ORDER — PROPOFOL 10 MG/ML IV BOLUS
INTRAVENOUS | Status: AC
  Filled 2020-11-17: qty 20

## 2020-11-17 MED ORDER — INSULIN ASPART 100 UNIT/ML IJ SOLN
0.0000 [IU] | Freq: Three times a day (TID) | INTRAMUSCULAR | Status: DC
  Administered 2020-11-17: 11 [IU] via SUBCUTANEOUS
  Administered 2020-11-17: 15 [IU] via SUBCUTANEOUS
  Administered 2020-11-18: 11 [IU] via SUBCUTANEOUS

## 2020-11-17 MED ORDER — DEXAMETHASONE SODIUM PHOSPHATE 10 MG/ML IJ SOLN
INTRAMUSCULAR | Status: DC | PRN
  Administered 2020-11-17: 5 mg via INTRAVENOUS

## 2020-11-17 MED ORDER — ONDANSETRON HCL 4 MG/2ML IJ SOLN: 4.0000 mg | Freq: Four times a day (QID) | INTRAMUSCULAR | Status: DC | PRN

## 2020-11-17 MED ORDER — OXYCODONE-ACETAMINOPHEN 5-325 MG PO TABS
1.0000 | ORAL_TABLET | ORAL | Status: DC | PRN
  Administered 2020-11-17 – 2020-11-18 (×5): 2 via ORAL

## 2020-11-17 MED ORDER — ACETAMINOPHEN 500 MG PO TABS
1000.0000 mg | ORAL_TABLET | Freq: Once | ORAL | Status: AC
  Administered 2020-11-17: 1000 mg via ORAL

## 2020-11-17 MED ORDER — LACTATED RINGERS IV SOLN: INTRAVENOUS | Status: DC

## 2020-11-17 MED ORDER — CLINDAMYCIN PHOSPHATE 900 MG/50ML IV SOLN
INTRAVENOUS | Status: AC
  Filled 2020-11-17: qty 50

## 2020-11-17 MED ORDER — MENTHOL 3 MG MT LOZG: 1.0000 | LOZENGE | OROMUCOSAL | Status: DC | PRN

## 2020-11-17 MED ORDER — PANTOPRAZOLE SODIUM 40 MG PO TBEC
40.0000 mg | DELAYED_RELEASE_TABLET | Freq: Every day | ORAL | Status: DC
Start: 2020-11-17 — End: 2020-11-18

## 2020-11-17 MED ORDER — FENTANYL CITRATE (PF) 100 MCG/2ML IJ SOLN
INTRAMUSCULAR | Status: AC
  Filled 2020-11-17: qty 2

## 2020-11-17 MED ORDER — PHENYLEPHRINE 40 MCG/ML (10ML) SYRINGE FOR IV PUSH (FOR BLOOD PRESSURE SUPPORT)
PREFILLED_SYRINGE | INTRAVENOUS | Status: AC
  Filled 2020-11-17: qty 10

## 2020-11-17 MED ORDER — KETOROLAC TROMETHAMINE 30 MG/ML IJ SOLN
INTRAMUSCULAR | Status: AC
  Filled 2020-11-17: qty 1

## 2020-11-17 MED ORDER — FENTANYL CITRATE (PF) 100 MCG/2ML IJ SOLN
INTRAMUSCULAR | Status: DC | PRN
  Administered 2020-11-17 (×3): 50 ug via INTRAVENOUS

## 2020-11-17 MED ORDER — 0.9 % SODIUM CHLORIDE (POUR BTL) OPTIME
TOPICAL | Status: DC | PRN
  Administered 2020-11-17: 500 mL

## 2020-11-17 MED ORDER — ONDANSETRON HCL 4 MG PO TABS: 4.0000 mg | ORAL_TABLET | Freq: Four times a day (QID) | ORAL | Status: DC | PRN

## 2020-11-17 MED ORDER — PROPOFOL 10 MG/ML IV BOLUS
INTRAVENOUS | Status: DC | PRN
  Administered 2020-11-17: 150 mg via INTRAVENOUS

## 2020-11-17 MED ORDER — BUPIVACAINE HCL (PF) 0.25 % IJ SOLN
INTRAMUSCULAR | Status: DC | PRN
  Administered 2020-11-17: 4 mL

## 2020-11-17 MED ORDER — SCOPOLAMINE 1 MG/3DAYS TD PT72
1.0000 | MEDICATED_PATCH | TRANSDERMAL | Status: DC
  Administered 2020-11-17: 1.5 mg via TRANSDERMAL

## 2020-11-17 MED ORDER — ONDANSETRON HCL 4 MG/2ML IJ SOLN
INTRAMUSCULAR | Status: DC | PRN
  Administered 2020-11-17: 4 mg via INTRAVENOUS

## 2020-11-17 MED ORDER — ARTIFICIAL TEARS OPHTHALMIC OINT
TOPICAL_OINTMENT | OPHTHALMIC | Status: AC
  Filled 2020-11-17: qty 3.5

## 2020-11-17 MED ORDER — DEXAMETHASONE SODIUM PHOSPHATE 10 MG/ML IJ SOLN
INTRAMUSCULAR | Status: AC
  Filled 2020-11-17: qty 1

## 2020-11-17 MED ORDER — ORAL CARE MOUTH RINSE: 15.0000 mL | Freq: Once | OROMUCOSAL | Status: DC

## 2020-11-17 MED ORDER — DOCUSATE SODIUM 100 MG PO CAPS
ORAL_CAPSULE | ORAL | Status: AC
  Filled 2020-11-17: qty 1

## 2020-11-17 MED ORDER — HYDROMORPHONE HCL 1 MG/ML IJ SOLN: 0.2000 mg | INTRAMUSCULAR | Status: DC | PRN

## 2020-11-17 MED ORDER — GENTAMICIN SULFATE 40 MG/ML IJ SOLN
5.0000 mg/kg | INTRAVENOUS | Status: AC
  Administered 2020-11-17: 390 mg via INTRAVENOUS
  Filled 2020-11-17: qty 9.75

## 2020-11-17 MED ORDER — FENTANYL CITRATE (PF) 100 MCG/2ML IJ SOLN
100.0000 ug | Freq: Once | INTRAMUSCULAR | Status: AC
  Administered 2020-11-17: 50 ug via INTRAVENOUS

## 2020-11-17 MED ORDER — DOXYCYCLINE HYCLATE 100 MG PO TABS
ORAL_TABLET | ORAL | Status: AC
  Filled 2020-11-17: qty 1

## 2020-11-17 MED ORDER — INSULIN ASPART 100 UNIT/ML IJ SOLN
INTRAMUSCULAR | Status: AC
  Filled 2020-11-17: qty 1

## 2020-11-17 MED ORDER — ROCURONIUM BROMIDE 10 MG/ML (PF) SYRINGE
PREFILLED_SYRINGE | INTRAVENOUS | Status: DC | PRN
  Administered 2020-11-17: 100 mg via INTRAVENOUS

## 2020-11-17 MED ORDER — NORTRIPTYLINE HCL 10 MG PO CAPS
30.0000 mg | ORAL_CAPSULE | Freq: Every day | ORAL | Status: DC
  Administered 2020-11-17: 30 mg via ORAL
  Filled 2020-11-17: qty 3

## 2020-11-17 MED ORDER — FENTANYL CITRATE (PF) 100 MCG/2ML IJ SOLN: 50.0000 ug | INTRAMUSCULAR | Status: DC | PRN

## 2020-11-17 MED ORDER — OXYCODONE-ACETAMINOPHEN 5-325 MG PO TABS
ORAL_TABLET | ORAL | Status: AC
  Filled 2020-11-17: qty 2

## 2020-11-17 MED ORDER — SIMETHICONE 80 MG PO CHEW: 80.0000 mg | CHEWABLE_TABLET | Freq: Four times a day (QID) | ORAL | Status: DC | PRN

## 2020-11-17 MED ORDER — CELECOXIB 200 MG PO CAPS
200.0000 mg | ORAL_CAPSULE | Freq: Once | ORAL | Status: AC
  Administered 2020-11-17: 200 mg via ORAL

## 2020-11-17 MED ORDER — LIDOCAINE HCL (PF) 2 % IJ SOLN
INTRAMUSCULAR | Status: AC
  Filled 2020-11-17: qty 5

## 2020-11-17 MED ORDER — INSULIN ASPART 100 UNIT/ML IJ SOLN
6.0000 [IU] | Freq: Three times a day (TID) | INTRAMUSCULAR | Status: DC
  Administered 2020-11-17 – 2020-11-18 (×3): 6 [IU] via SUBCUTANEOUS

## 2020-11-17 MED ORDER — CHLORHEXIDINE GLUCONATE 0.12 % MT SOLN: 15.0000 mL | Freq: Once | OROMUCOSAL | Status: DC

## 2020-11-17 MED ORDER — GLYCOPYRROLATE PF 0.2 MG/ML IJ SOSY
PREFILLED_SYRINGE | INTRAMUSCULAR | Status: AC
  Filled 2020-11-17: qty 1

## 2020-11-17 MED ORDER — LIDOCAINE 2% (20 MG/ML) 5 ML SYRINGE
INTRAMUSCULAR | Status: DC | PRN
  Administered 2020-11-17: 100 mg via INTRAVENOUS

## 2020-11-17 MED ORDER — ONDANSETRON HCL 4 MG/2ML IJ SOLN
INTRAMUSCULAR | Status: AC
  Filled 2020-11-17: qty 2

## 2020-11-17 MED ORDER — POVIDONE-IODINE 10 % EX SWAB: 2.0000 "application " | Freq: Once | CUTANEOUS | Status: DC

## 2020-11-17 MED ORDER — AMISULPRIDE (ANTIEMETIC) 5 MG/2ML IV SOLN: 10.0000 mg | Freq: Once | INTRAVENOUS | Status: DC | PRN

## 2020-11-17 MED ORDER — MIDAZOLAM HCL 2 MG/2ML IJ SOLN
INTRAMUSCULAR | Status: AC
  Filled 2020-11-17: qty 2

## 2020-11-17 MED ORDER — GABAPENTIN 100 MG PO CAPS
100.0000 mg | ORAL_CAPSULE | Freq: Three times a day (TID) | ORAL | Status: DC
  Administered 2020-11-17 (×2): 100 mg via ORAL

## 2020-11-17 MED ORDER — FENTANYL CITRATE (PF) 250 MCG/5ML IJ SOLN
INTRAMUSCULAR | Status: AC
  Filled 2020-11-17: qty 5

## 2020-11-17 MED ORDER — ACETAMINOPHEN 500 MG PO TABS
ORAL_TABLET | ORAL | Status: AC
  Filled 2020-11-17: qty 2

## 2020-11-17 MED ORDER — PROMETHAZINE HCL 25 MG/ML IJ SOLN: 6.2500 mg | INTRAMUSCULAR | Status: DC | PRN

## 2020-11-17 MED ORDER — LAMOTRIGINE 25 MG PO TABS
25.0000 mg | ORAL_TABLET | Freq: Every day | ORAL | Status: DC
  Administered 2020-11-17: 25 mg via ORAL
  Filled 2020-11-17: qty 1

## 2020-11-17 MED ORDER — CLINDAMYCIN PHOSPHATE 900 MG/50ML IV SOLN
900.0000 mg | INTRAVENOUS | Status: AC
  Administered 2020-11-17: 900 mg via INTRAVENOUS

## 2020-11-17 MED ORDER — MIDAZOLAM HCL 5 MG/5ML IJ SOLN
INTRAMUSCULAR | Status: DC | PRN
  Administered 2020-11-17: 2 mg via INTRAVENOUS

## 2020-11-17 MED ORDER — KETOROLAC TROMETHAMINE 30 MG/ML IJ SOLN
30.0000 mg | Freq: Four times a day (QID) | INTRAMUSCULAR | Status: DC
  Administered 2020-11-17 – 2020-11-18 (×4): 30 mg via INTRAVENOUS

## 2020-11-17 MED ORDER — DOXYCYCLINE HYCLATE 100 MG PO TABS: 100.0000 mg | ORAL_TABLET | Freq: Two times a day (BID) | ORAL | Status: DC

## 2020-11-17 MED ORDER — ROCURONIUM BROMIDE 10 MG/ML (PF) SYRINGE
PREFILLED_SYRINGE | INTRAVENOUS | Status: AC
  Filled 2020-11-17: qty 10

## 2020-11-17 MED ORDER — SCOPOLAMINE 1 MG/3DAYS TD PT72
MEDICATED_PATCH | TRANSDERMAL | Status: AC
  Filled 2020-11-17: qty 1

## 2020-11-17 MED ORDER — ALPRAZOLAM 0.5 MG PO TABS: 1.0000 mg | ORAL_TABLET | Freq: Two times a day (BID) | ORAL | Status: DC | PRN

## 2020-11-17 MED ORDER — PHENYLEPHRINE 40 MCG/ML (10ML) SYRINGE FOR IV PUSH (FOR BLOOD PRESSURE SUPPORT)
PREFILLED_SYRINGE | INTRAVENOUS | Status: DC | PRN
  Administered 2020-11-17: 80 ug via INTRAVENOUS

## 2020-11-17 MED ORDER — GLYCOPYRROLATE PF 0.2 MG/ML IJ SOSY
PREFILLED_SYRINGE | INTRAMUSCULAR | Status: DC | PRN
  Administered 2020-11-17: .2 mg via INTRAVENOUS

## 2020-11-17 MED ORDER — SUGAMMADEX SODIUM 200 MG/2ML IV SOLN
INTRAVENOUS | Status: DC | PRN
  Administered 2020-11-17: 200 mg via INTRAVENOUS

## 2020-11-17 MED ORDER — CELECOXIB 200 MG PO CAPS
ORAL_CAPSULE | ORAL | Status: AC
  Filled 2020-11-17: qty 1

## 2020-11-17 MED ORDER — FENTANYL CITRATE (PF) 100 MCG/2ML IJ SOLN
25.0000 ug | INTRAMUSCULAR | Status: DC | PRN
  Administered 2020-11-17 (×3): 50 ug via INTRAVENOUS

## 2020-11-17 MED ORDER — DOCUSATE SODIUM 100 MG PO CAPS
100.0000 mg | ORAL_CAPSULE | Freq: Two times a day (BID) | ORAL | Status: DC
  Administered 2020-11-17 (×2): 100 mg via ORAL

## 2020-11-17 MED ORDER — FLUOXETINE HCL 20 MG PO CAPS
40.0000 mg | ORAL_CAPSULE | Freq: Every day | ORAL | Status: DC
  Filled 2020-11-17: qty 2

## 2020-11-17 SURGICAL SUPPLY — 53 items
ADH SKN CLS APL DERMABOND .7 (GAUZE/BANDAGES/DRESSINGS) ×2
BLADE CLIPPER SENSICLIP SURGIC (BLADE) IMPLANT
CATH ROBINSON RED A/P 16FR (CATHETERS) IMPLANT
CATH SILICONE 16FRX5CC (CATHETERS) ×2 IMPLANT
COVER BACK TABLE 60X90IN (DRAPES) ×2 IMPLANT
COVER MAYO STAND STRL (DRAPES) ×4 IMPLANT
DECANTER SPIKE VIAL GLASS SM (MISCELLANEOUS) IMPLANT
DERMABOND ADVANCED (GAUZE/BANDAGES/DRESSINGS) ×2
DERMABOND ADVANCED .7 DNX12 (GAUZE/BANDAGES/DRESSINGS) ×2 IMPLANT
DRSG COVADERM PLUS 2X2 (GAUZE/BANDAGES/DRESSINGS) IMPLANT
DRSG OPSITE POSTOP 3X4 (GAUZE/BANDAGES/DRESSINGS) IMPLANT
DURAPREP 26ML APPLICATOR (WOUND CARE) ×4 IMPLANT
ELECT REM PT RETURN 9FT ADLT (ELECTROSURGICAL) ×2
ELECTRODE REM PT RTRN 9FT ADLT (ELECTROSURGICAL) ×1 IMPLANT
GAUZE 4X4 16PLY ~~LOC~~+RFID DBL (SPONGE) ×4 IMPLANT
GLOVE SURG POLYISO LF SZ5.5 (GLOVE) ×4 IMPLANT
GLOVE SURG UNDER POLY LF SZ6 (GLOVE) ×4 IMPLANT
HIBICLENS CHG 4% 4OZ BTL (MISCELLANEOUS) ×2 IMPLANT
HOLDER FOLEY CATH W/STRAP (MISCELLANEOUS) ×2 IMPLANT
IV NS 1000ML (IV SOLUTION)
IV NS 1000ML BAXH (IV SOLUTION) IMPLANT
IV NS IRRIG 3000ML ARTHROMATIC (IV SOLUTION) IMPLANT
KIT TURNOVER CYSTO (KITS) ×2 IMPLANT
LIGASURE IMPACT 36 18CM CVD LR (INSTRUMENTS) ×2 IMPLANT
LIGASURE LAP L-HOOKWIRE 5 44CM (INSTRUMENTS) ×2 IMPLANT
MANIPULATOR UTERINE 4.5 ZUMI (MISCELLANEOUS) IMPLANT
NEEDLE INSUFFLATION 120MM (ENDOMECHANICALS) ×2 IMPLANT
NS IRRIG 500ML POUR BTL (IV SOLUTION) ×2 IMPLANT
PACK LAVH (CUSTOM PROCEDURE TRAY) ×2 IMPLANT
PACK ROBOTIC GOWN (GOWN DISPOSABLE) ×2 IMPLANT
PACK TRENDGUARD 450 HYBRID PRO (MISCELLANEOUS) ×1 IMPLANT
PAD OB MATERNITY 4.3X12.25 (PERSONAL CARE ITEMS) ×2 IMPLANT
PAD PREP 24X48 CUFFED NSTRL (MISCELLANEOUS) ×2 IMPLANT
SCISSORS LAP 5X35 DISP (ENDOMECHANICALS) IMPLANT
SET SUCTION IRRIG HYDROSURG (IRRIGATION / IRRIGATOR) IMPLANT
SET TUBE SMOKE EVAC HIGH FLOW (TUBING) ×2 IMPLANT
SPONGE T-LAP 4X18 ~~LOC~~+RFID (SPONGE) ×2 IMPLANT
SUT MNCRL 0 MO-4 VIOLET 18 CR (SUTURE) ×2 IMPLANT
SUT MNCRL AB 3-0 PS2 18 (SUTURE) ×2 IMPLANT
SUT MON AB 2-0 CT1 36 (SUTURE) ×2 IMPLANT
SUT MONOCRYL 0 MO 4 18  CR/8 (SUTURE) ×4
SUT VIC AB 0 CT1 27 (SUTURE)
SUT VIC AB 0 CT1 27XCR 8 STRN (SUTURE) IMPLANT
SUT VICRYL 0 TIES 12 18 (SUTURE) ×2 IMPLANT
SUT VICRYL 0 UR6 27IN ABS (SUTURE) ×2 IMPLANT
SYR BULB IRRIG 60ML STRL (SYRINGE) IMPLANT
TOWEL OR 17X26 10 PK STRL BLUE (TOWEL DISPOSABLE) ×2 IMPLANT
TRAY FOLEY W/BAG SLVR 14FR LF (SET/KITS/TRAYS/PACK) ×2 IMPLANT
TRENDGUARD 450 HYBRID PRO PACK (MISCELLANEOUS) ×2
TROCAR BLADELESS OPT 5 100 (ENDOMECHANICALS) ×2 IMPLANT
TROCAR XCEL NON-BLD 11X100MML (ENDOMECHANICALS) ×2 IMPLANT
WARMER LAPAROSCOPE (MISCELLANEOUS) ×2 IMPLANT
WATER STERILE IRR 500ML POUR (IV SOLUTION) IMPLANT

## 2020-11-17 NOTE — Progress Notes (Addendum)
Dr. Lynnette Caffey called with update on blood glucose and pt routinely takes lantis 130 units at bedtime.  Dr. Lynnette Caffey doesn't want her to get lantis tonight.  Dr. Lynnette Caffey wants to use the sliding scale for mealtime to cover for bedtime glucose of 279.  Will continue to monitor pt

## 2020-11-17 NOTE — Op Note (Signed)
PROCEDURE DATE: 11/17/2020 PREOPERATIVE DIAGNOSIS: Heavy menstrual bleeding POSTOPERATIVE DIAGNOSIS: The same  PROCEDURE: Laparoscopic Assisted Vaginal Hysterectomy, Bilateral salpingectomy SURGEON: Dr. Linda Hedges  ASSISTANT: Dr. Lucillie Garfinkel INDICATIONS: 40 y.o. G2P2 with heavy menstrual bleeding desiring definitive surgical management. Risks of surgery were discussed with the patient including but not limited to: bleeding which may require transfusion or reoperation; infection which may require antibiotics; injury to bowel, bladder, ureters or other surrounding organs; need for additional procedures including laparotomy; thromboembolic phenomenon, incisional problems and other postoperative/anesthesia complications. Written informed consent was obtained.  FINDINGS: Slightly enlarged and boggy uterus, normal adnexa bilaterally. No evidence of endometriosis. Abnormal appearing liver with multiple small cystic lesions (photo was taken).  ANESTHESIA: General  ESTIMATED BLOOD LOSS: 50 ml  SPECIMENS: Uterus and cervix, bilateral fallopian tubes COMPLICATIONS: None immediate  PROCEDURE IN DETAIL: The patient received intravenous antibiotics and had sequential compression devices applied to her lower extremities while in the preoperative area. She was then taken to the operating room where general anesthesia was administered and was found to be adequate. She was placed in the dorsal lithotomy position, and was prepped and draped in a sterile manner. An in and out catheterization was performed for 5 cc concentrated urine. A uterine manipulator was then advanced into the uterus . After an adequate timeout was performed, attention was then turned to the patient's abdomen where a 10-mm skin incision was made in the umbilical fold. Sharp and blunt dissection was utilized to identify fascial layer.  Fascia was grasped with 2 hemostats and elevated.  Fascia was entered sharply and tagged bilaterally with 0 vicryl.   The 11 mm scope was advanced with Optiview.  Insufflation of carbon dioxide gas was performed. Adequate pneumoperitoneum was obtained. A survey of the patient's pelvis and abdomen revealed the above dictated findings.  Suprapubic 5 mm port was then placed under direct visualization. The pelvis was then carefully examined. On the right side, the fallopian tube was elevated and free from the mesosalpinx using the Ligasure.  The fallopian tube was transected and removed from the umbilical port to allow improved visualization during the remainder of the dissection.  The round ligament was then clamped and transected with the Ligasure. The uteroovarian ligament was also clamped and transected. The leaves of the broad ligament were separated and serially transected. These procedures were then repeated on the left side. The ureters were noted to be safely away from the area of dissection.   At this point, attention was turned to the vaginal portion of the case. A weighted speculum was placed posteriorly, a Deaver anteriorly, and the cervix grasped with a thyroid tenaculum. Once the anterior and posterior reflections were identified, the cervix was circumscribed using the Bovie knife. Next, using Mayos, the posterior cul-de-sac was entered. The uterosacrals were clamped, cut and tagged using 0 monocryl.  Next, the bladder reflection was identified. Using Metzenbaums, it was entered and palpation and direct visualization confirmed proper location. Next, using the LigaSure, the uterine arteries were coapted and cut bilaterally. The pedicles were visualized after coaptation and were hemostatic. The same was performed sequentially cephalad until the uterus and cervix were removed. The pedicles were inspected and found to be hemostatic.  Next, the tagged uterosacrals were tied together in the midline.  The remainder of the cuff was closed with 0 monocryl figure of eight stitches.  The cuff was inspected and found to be  hemostatic.  Foley catheter was placed and drained clear, yellow urine.  Attention was returned  to the abdomen were a second laparoscopic look was taken. All pedicles were hemostatic.  Insufflation was removed after all instruments were removed. Infraumbilical fascia was closed using tagged stitches tied together.   Both skin incisions were closed with 4-0 monocryl subcuticular stitches and Dermabond. The patient tolerated the procedures well. All instruments, needles, and sponge counts were correct x 2. The patient was taken to the recovery room awake, extubated and in stable condition.

## 2020-11-17 NOTE — Anesthesia Postprocedure Evaluation (Signed)
Anesthesia Post Note  Patient: Laurie Sutton  Procedure(s) Performed: LAPAROSCOPIC ASSISTED VAGINAL HYSTERECTOMY WITH BILATERAL SALPINGECTOMY (Bilateral: Abdomen)     Patient location during evaluation: PACU Anesthesia Type: General Level of consciousness: sedated Pain management: pain level controlled Vital Signs Assessment: post-procedure vital signs reviewed and stable Respiratory status: spontaneous breathing and respiratory function stable Cardiovascular status: stable Postop Assessment: no apparent nausea or vomiting Anesthetic complications: no   No notable events documented.  Last Vitals:  Vitals:   11/17/20 1127 11/17/20 1146  BP: 123/74 132/70  Pulse: 73 73  Resp: 14 15  Temp: 36.7 C (!) 36.4 C  SpO2: 94% 93%    Last Pain:  Vitals:   11/17/20 1146  TempSrc:   PainSc: 6                  Bliss Behnke DANIEL

## 2020-11-17 NOTE — Progress Notes (Signed)
No change to H&P.  Laurie Spadafore, DO 

## 2020-11-17 NOTE — Anesthesia Procedure Notes (Signed)
Procedure Name: Intubation Date/Time: 11/17/2020 7:36 AM Performed by: Rogers Blocker, CRNA Pre-anesthesia Checklist: Patient identified, Emergency Drugs available, Suction available and Patient being monitored Patient Re-evaluated:Patient Re-evaluated prior to induction Oxygen Delivery Method: Circle System Utilized Preoxygenation: Pre-oxygenation with 100% oxygen Induction Type: IV induction Ventilation: Mask ventilation without difficulty Laryngoscope Size: Mac and 3 Grade View: Grade I Tube type: Oral Number of attempts: 1 Airway Equipment and Method: Stylet Placement Confirmation: ETT inserted through vocal cords under direct vision, positive ETCO2 and breath sounds checked- equal and bilateral Secured at: 22 cm Tube secured with: Tape Dental Injury: Teeth and Oropharynx as per pre-operative assessment

## 2020-11-17 NOTE — Transfer of Care (Signed)
Immediate Anesthesia Transfer of Care Note  Patient: Laurie Sutton  Procedure(s) Performed: LAPAROSCOPIC ASSISTED VAGINAL HYSTERECTOMY WITH BILATERAL SALPINGECTOMY (Bilateral: Abdomen)  Patient Location: PACU  Anesthesia Type:General  Level of Consciousness: awake, patient cooperative and responds to stimulation  Airway & Oxygen Therapy: Patient Spontanous Breathing and Patient connected to face mask oxygen  Post-op Assessment: Report given to RN and Post -op Vital signs reviewed and stable  Post vital signs: Reviewed and stable  Last Vitals:  Vitals Value Taken Time  BP 131/76 11/17/20 0946  Temp 36.7 C 11/17/20 0946  Pulse 69 11/17/20 0950  Resp 18 11/17/20 0950  SpO2 94 % 11/17/20 0950  Vitals shown include unvalidated device data.  Last Pain:  Vitals:   11/17/20 0607  TempSrc: Oral  PainSc: 0-No pain      Patients Stated Pain Goal: 5 (11/57/26 2035)  Complications: No notable events documented.

## 2020-11-17 NOTE — Progress Notes (Addendum)
Day of Surgery Procedure(s) (LRB): LAPAROSCOPIC ASSISTED VAGINAL HYSTERECTOMY WITH BILATERAL SALPINGECTOMY (Bilateral)  Subjective: Patient reports tolerating PO and + flatus.  Tolerating diet.  Ambulating well.  Objective: I have reviewed patient's vital signs, intake and output, medications, and labs. UOP clear and >100 cc/hr  General: alert, cooperative, and appears stated age Extremities: extremities normal, atraumatic, no cyanosis or edema Abd: soft, ND.  Inc c/d/I x 2  Assessment: s/p Procedure(s): LAPAROSCOPIC ASSISTED VAGINAL HYSTERECTOMY WITH BILATERAL SALPINGECTOMY (Bilateral): stable, progressing well, and tolerating diet  Plan: Advance diet Encourage ambulation AM labs pending D/C IVF and Foley in AM Likely d/c in AM T2DM-SSI with meal coverage.  Goal <200.  On diabetic diet Thrombocytopenia preop (109k).  Will check tomorrow am and arrange evaluation postop if still low.   LOS: 0 days    Linda Hedges 11/17/2020, 5:56 PM

## 2020-11-18 ENCOUNTER — Encounter (HOSPITAL_BASED_OUTPATIENT_CLINIC_OR_DEPARTMENT_OTHER): Payer: Self-pay | Admitting: Obstetrics & Gynecology

## 2020-11-18 DIAGNOSIS — N72 Inflammatory disease of cervix uteri: Secondary | ICD-10-CM | POA: Diagnosis not present

## 2020-11-18 LAB — CBC
HCT: 39.8 % (ref 36.0–46.0)
Hemoglobin: 13.4 g/dL (ref 12.0–15.0)
MCH: 30.5 pg (ref 26.0–34.0)
MCHC: 33.7 g/dL (ref 30.0–36.0)
MCV: 90.5 fL (ref 80.0–100.0)
Platelets: 139 10*3/uL — ABNORMAL LOW (ref 150–400)
RBC: 4.4 MIL/uL (ref 3.87–5.11)
RDW: 12.5 % (ref 11.5–15.5)
WBC: 7.2 10*3/uL (ref 4.0–10.5)
nRBC: 0 % (ref 0.0–0.2)

## 2020-11-18 LAB — SURGICAL PATHOLOGY

## 2020-11-18 LAB — COMPREHENSIVE METABOLIC PANEL
ALT: 37 U/L (ref 0–44)
AST: 38 U/L (ref 15–41)
Albumin: 3.9 g/dL (ref 3.5–5.0)
Alkaline Phosphatase: 84 U/L (ref 38–126)
Anion gap: 11 (ref 5–15)
BUN: 9 mg/dL (ref 6–20)
CO2: 26 mmol/L (ref 22–32)
Calcium: 9.1 mg/dL (ref 8.9–10.3)
Chloride: 97 mmol/L — ABNORMAL LOW (ref 98–111)
Creatinine, Ser: 0.58 mg/dL (ref 0.44–1.00)
GFR, Estimated: >60 mL/min (ref >60)
Glucose, Bld: 244 mg/dL — ABNORMAL HIGH (ref 70–99)
Potassium: 3.6 mmol/L (ref 3.5–5.1)
Sodium: 134 mmol/L — ABNORMAL LOW (ref 135–145)
Total Bilirubin: 1.1 mg/dL (ref 0.3–1.2)
Total Protein: 7.1 g/dL (ref 6.5–8.1)

## 2020-11-18 LAB — GLUCOSE, CAPILLARY: Glucose-Capillary: 269 mg/dL — ABNORMAL HIGH (ref 70–99)

## 2020-11-18 MED ORDER — OXYCODONE-ACETAMINOPHEN 5-325 MG PO TABS
ORAL_TABLET | ORAL | Status: AC
  Filled 2020-11-18: qty 2

## 2020-11-18 MED ORDER — KETOROLAC TROMETHAMINE 30 MG/ML IJ SOLN
INTRAMUSCULAR | Status: AC
  Filled 2020-11-18: qty 1

## 2020-11-18 MED ORDER — DIPHENHYDRAMINE HCL 25 MG PO CAPS
25.0000 mg | ORAL_CAPSULE | Freq: Four times a day (QID) | ORAL | Status: DC | PRN
  Administered 2020-11-18 (×2): 25 mg via ORAL

## 2020-11-18 MED ORDER — OXYCODONE-ACETAMINOPHEN 5-325 MG PO TABS: 1.0000 | ORAL_TABLET | ORAL | 0 refills | Status: DC | PRN

## 2020-11-18 MED ORDER — DIPHENHYDRAMINE HCL 25 MG PO CAPS
ORAL_CAPSULE | ORAL | Status: AC
  Filled 2020-11-18: qty 1

## 2020-11-18 MED ORDER — INSULIN ASPART 100 UNIT/ML IJ SOLN
INTRAMUSCULAR | Status: AC
  Filled 2020-11-18: qty 1

## 2020-11-18 MED ORDER — IBUPROFEN 800 MG PO TABS: 800.0000 mg | ORAL_TABLET | Freq: Four times a day (QID) | ORAL | 1 refills | Status: DC | PRN

## 2020-11-18 NOTE — Progress Notes (Signed)
Pt c/o of feeling itchy all over, dr. Lynnette Caffey called and left message on her phone.

## 2020-11-18 NOTE — Discharge Instructions (Signed)
Call MD for T>100.4, heavy vaginal bleeding, severe abdominal pain, intractable nausea and/or vomiting, or respiratory distress.  Call office to schedule postop appointment in 2 weeks.  Pelvic rest x 6 weeks.  No driving while taking narcotics.  No heavy lifting.

## 2020-11-18 NOTE — Progress Notes (Signed)
Dr. Corinna Capra on called for pt being itchy and needing order for benadryl. Orders recived

## 2020-11-18 NOTE — Discharge Summary (Signed)
Physician Discharge Summary  Patient ID: Laurie Sutton MRN: [id number] DOB/AGE: May 30, 1980 40 y.o.  Admit date: 11/17/2020 Discharge date: 11/18/2020  Admission Diagnoses: Heavy menstrual bleeding  Discharge Diagnoses:  Active Problems:   Menorrhagia   S/P laparoscopic assisted vaginal hysterectomy (LAVH)   Discharged Condition: good  Hospital Course: Patient was admitted for definitive surgical management of heavy menstrual bleeding.  She underwent anticipate LAVH, bilateral salpingectomy without complication.  Please see operative note for details.  Postoperatively, patient did well and was meeting all postop goals on POD#1.  She was voiding, passing flatus, ambulating, adequate pain control on po medications, tolerating diet.  Patient has T2DM and was continued on insulin postop.  She was discharged home on POD#1 with plans to f/u in office in 2 weeks.  Incidentally found thrombocytopenia will be followed up on with hematology referral outpatient.  Patient to f/u with endo for hyperglycemia although I anticipate improvement postoperatively.    Consults: None  Significant Diagnostic Studies: none  Treatments: insulin: Humalog and surgery: LAVH, bilateral salpingectomy  Discharge Exam: Blood pressure 130/74, pulse 70, temperature 98.4 F (36.9 C), resp. rate 16, height 5' 6"  (1.676 m), weight 105.8 kg, last menstrual period 11/13/2020, SpO2 98 %. General appearance: alert, cooperative, and appears stated age Extremities: extremities normal, atraumatic, no cyanosis or edema Incision/Wound: Abd: soft, ND, incisions c/d/I x 2    Disposition: Discharge disposition: 01-Home or Self Care       Discharge Instructions     Discharge patient   Complete by: As directed    Discharge disposition: 01-Home or Self Care   Discharge patient date: 11/18/2020      Allergies as of 11/18/2020       Reactions   Amoxicillin Hives, Itching   Azithromycin    DOES NOT WORK   Dulaglutide  Other (See Comments)   JANUVIA STOAMCH HURT?   Latex    IRRITATES VAGINAL AREA   Penicillins Hives   Has patient had a PCN reaction causing immediate rash, facial/tongue/throat swelling, SOB or lightheadedness with hypotension: yes. Rash  Has patient had a PCN reaction causing severe rash involving mucus membranes or skin necrosis: Yes- rash and hives all over body  Has patient had a PCN reaction that required hospitalization No Has patient had a PCN reaction occurring within the last 10 years: Yes  If all of the above answers are "NO", then may proceed with Cephalosporin use.   Sitagliptin Other (See Comments)   HURTS STOAMCH        Medication List     STOP taking these medications    High Potency Iron 65 MG Tabs       TAKE these medications    ALPRAZolam 1 MG tablet Commonly known as: XANAX TAKE 1 TABLET (1 MG TOTAL) BY MOUTH 2 (TWO) TIMES DAILY AS NEEDED FOR ANXIETY OR SLEEP.   clotrimazole-betamethasone cream Commonly known as: LOTRISONE Apply 1 application topically 2 (two) times daily as needed.   Cranberry 125 MG Tabs Take by mouth.   doxycycline 100 MG tablet Commonly known as: VIBRA-TABS Take 1 tablet (100 mg total) by mouth 2 (two) times daily.   FLUoxetine 40 MG capsule Commonly known as: PROZAC Take 1 capsule (40 mg total) by mouth daily.   FreeStyle Libre 14 Day Sensor Misc Apply topically every 14 (fourteen) days.   gabapentin 100 MG capsule Commonly known as: NEURONTIN Take 1 capsule (100 mg total) by mouth 3 (three) times daily. What changed:  when to take  this reasons to take this   ibuprofen 800 MG tablet Commonly known as: ADVIL Take 1 tablet (800 mg total) by mouth every 6 (six) hours as needed. What changed:  when to take this reasons to take this   insulin lispro 100 UNIT/ML KwikPen Commonly known as: HUMALOG Inject 50-70 Units into the skin with breakfast, with lunch, and with evening meal.   lamoTRIgine 25 MG tablet Commonly  known as: LAMICTAL TAKE 1 TABLET BY MOUTH EVERYDAY AT BEDTIME   Lantus SoloStar 100 UNIT/ML Solostar Pen Generic drug: insulin glargine Inject 130 Units into the skin at bedtime.   metFORMIN 500 MG 24 hr tablet Commonly known as: GLUCOPHAGE-XR Take 1,500 mg by mouth at bedtime.   montelukast 10 MG tablet Commonly known as: SINGULAIR TAKE 1 TABLET BY MOUTH EVERYDAY AT BEDTIME   nortriptyline 10 MG capsule Commonly known as: PAMELOR Take 3 capsules (30 mg total) by mouth at bedtime.   omeprazole 40 MG capsule Commonly known as: PRILOSEC Take 1 capsule (40 mg total) by mouth 2 (two) times daily before a meal.   oxyCODONE-acetaminophen 5-325 MG tablet Commonly known as: PERCOCET/ROXICET Take 1 tablet by mouth every 4 (four) hours as needed for moderate pain.   propranolol 40 MG tablet Commonly known as: INDERAL Take 1 tablet (40 mg total) by mouth 2 (two) times daily.         Signed: Linda Hedges 11/18/2020, 7:18 AM

## 2020-11-19 ENCOUNTER — Telehealth: Payer: Self-pay | Admitting: Hematology and Oncology

## 2020-11-19 NOTE — Telephone Encounter (Signed)
Received an new hem referral from Dr. Lynnette Caffey for leukopenia and thrombocytopenia. Laurie Sutton has been cld and scheduled to see Laurie Sutton on 7/19 at 1pm. Pt aware to arrive 20 minutes early.

## 2020-11-26 ENCOUNTER — Other Ambulatory Visit: Payer: Self-pay

## 2020-11-26 ENCOUNTER — Telehealth: Payer: Self-pay | Admitting: Hematology and Oncology

## 2020-11-26 ENCOUNTER — Other Ambulatory Visit: Payer: Self-pay | Admitting: Obstetrics & Gynecology

## 2020-11-26 ENCOUNTER — Ambulatory Visit
Admission: RE | Admit: 2020-11-26 | Discharge: 2020-11-26 | Disposition: A | Discharge status: Home / Self Care | Payer: Commercial Managed Care - PPO | Source: Ambulatory Visit | Attending: Obstetrics & Gynecology | Admitting: Obstetrics & Gynecology

## 2020-11-26 DIAGNOSIS — K429 Umbilical hernia without obstruction or gangrene: Secondary | ICD-10-CM

## 2020-11-26 MED ORDER — IOPAMIDOL (ISOVUE-300) INJECTION 61%
100.0000 mL | Freq: Once | INTRAVENOUS | Status: AC | PRN
  Administered 2020-11-26: 100 mL via INTRAVENOUS

## 2020-11-26 NOTE — Telephone Encounter (Signed)
Patient called in to reschedule 08/19 appointment to end of September.

## 2020-11-28 ENCOUNTER — Inpatient Hospital Stay: Payer: Commercial Managed Care - PPO | Admitting: Hematology and Oncology

## 2020-12-24 ENCOUNTER — Other Ambulatory Visit: Payer: Self-pay

## 2020-12-24 ENCOUNTER — Ambulatory Visit (INDEPENDENT_AMBULATORY_CARE_PROVIDER_SITE_OTHER): Payer: Commercial Managed Care - PPO | Admitting: Podiatry

## 2020-12-24 DIAGNOSIS — Z9889 Other specified postprocedural states: Secondary | ICD-10-CM

## 2020-12-24 NOTE — Progress Notes (Signed)
   Subjective:  Patient presents today status post endoscopic plantar fasciotomy right with partial permanent nail avulsion lateral border right great toe. DOS: 10/02/2020.  Patient states that she has had a return and increase of pain to the plantar heel over the past 3-4 weeks.  She cannot recall an incident that irritated the foot.  She denies any increase of activity.  Past Medical History:  Diagnosis Date   Anemia    IRON TRANSFUSION 07-2020 NONE SINCE   Anxiety    COVID 08/12/2019   ALL SYMPTOMS REOLVED PER PT   Depression    Diabetic neuropathy ([place]) 11/11/2020   FEET   dm type 2    sees [name]    GERD (gastroesophageal reflux disease)    History of kidney stones    Menorrhagia 11/11/2020   Migraines    Murmur, cardiac    FAINT NO CARDIOLOGIST   Pneumonia 08/12/2019   COVID PNEUMONIA ALL SYMPTOMS RESOLVED PER PT      Objective/Physical Exam Neurovascular status intact.  Skin incisions appear to be well coapted with sutures intact. No sign of infectious process noted. No dehiscence. No active bleeding noted.  No edema noted.  There is some tenderness to palpation along the plantar heel   Assessment: 1. s/p endoscopic plantar fasciotomy right.  Partial permanent nail matricectomy right lateral hallux. DOS: 10/02/2020   Plan of Care:  1. Patient was evaluated. 2.  There continues to be some pain and tenderness to the plantar heel of the surgical foot.  Recommend rest ice compression and elevation. 3.  The patient has not been wearing the custom orthotics.  Recommend that she wears the custom orthotics daily.  If the pain is exacerbated recommend cam boot 4.  Return to clinic in 2 months  *Patient recently had hysterectomy surgery *Works in [employer] at [employer].  Daughter, Ava, in 7th grade at [employer]    Edrick Kins, DPM [facility]  Dr. Edrick Kins, DPM    [address],  [address]                 Office [phone]  Fax [phone]

## 2020-12-31 ENCOUNTER — Other Ambulatory Visit: Payer: Self-pay | Admitting: Family Medicine

## 2021-01-06 ENCOUNTER — Other Ambulatory Visit: Payer: Self-pay

## 2021-01-06 ENCOUNTER — Inpatient Hospital Stay: Payer: Commercial Managed Care - PPO | Attending: Hematology and Oncology | Admitting: Hematology and Oncology

## 2021-01-06 DIAGNOSIS — D732 Chronic congestive splenomegaly: Secondary | ICD-10-CM

## 2021-01-06 DIAGNOSIS — D72819 Decreased white blood cell count, unspecified: Secondary | ICD-10-CM | POA: Diagnosis not present

## 2021-01-06 DIAGNOSIS — K7581 Nonalcoholic steatohepatitis (NASH): Secondary | ICD-10-CM | POA: Diagnosis not present

## 2021-01-06 DIAGNOSIS — K7460 Unspecified cirrhosis of liver: Secondary | ICD-10-CM | POA: Diagnosis not present

## 2021-01-06 DIAGNOSIS — E119 Type 2 diabetes mellitus without complications: Secondary | ICD-10-CM | POA: Diagnosis not present

## 2021-01-06 DIAGNOSIS — Z794 Long term (current) use of insulin: Secondary | ICD-10-CM | POA: Diagnosis not present

## 2021-01-06 DIAGNOSIS — E669 Obesity, unspecified: Secondary | ICD-10-CM | POA: Insufficient documentation

## 2021-01-06 DIAGNOSIS — D696 Thrombocytopenia, unspecified: Secondary | ICD-10-CM | POA: Diagnosis not present

## 2021-01-07 ENCOUNTER — Encounter: Payer: Self-pay | Admitting: Hematology and Oncology

## 2021-01-07 DIAGNOSIS — E118 Type 2 diabetes mellitus with unspecified complications: Secondary | ICD-10-CM | POA: Insufficient documentation

## 2021-01-07 DIAGNOSIS — E669 Obesity, unspecified: Secondary | ICD-10-CM | POA: Insufficient documentation

## 2021-01-07 DIAGNOSIS — K7581 Nonalcoholic steatohepatitis (NASH): Secondary | ICD-10-CM | POA: Insufficient documentation

## 2021-01-07 DIAGNOSIS — D732 Chronic congestive splenomegaly: Secondary | ICD-10-CM | POA: Insufficient documentation

## 2021-01-07 DIAGNOSIS — K7460 Unspecified cirrhosis of liver: Secondary | ICD-10-CM | POA: Insufficient documentation

## 2021-01-07 DIAGNOSIS — D696 Thrombocytopenia, unspecified: Secondary | ICD-10-CM | POA: Insufficient documentation

## 2021-01-07 DIAGNOSIS — E119 Type 2 diabetes mellitus without complications: Secondary | ICD-10-CM | POA: Insufficient documentation

## 2021-01-07 DIAGNOSIS — D61818 Other pancytopenia: Secondary | ICD-10-CM | POA: Insufficient documentation

## 2021-01-07 DIAGNOSIS — D72819 Decreased white blood cell count, unspecified: Secondary | ICD-10-CM | POA: Insufficient documentation

## 2021-01-07 NOTE — Assessment & Plan Note (Signed)
I have reviewed recent CT imaging with the patient She has signs of liver cirrhosis I explained the pathophysiology for nonalcoholic steatohepatitis With signs of liver to disease at such a young age, I am concerned about her long-term health The cause of this is due to her chronic insulin-dependent diabetes We discussed aggressive approach by treating her underlying risk factors She is interested to be referred to weight management center for further management

## 2021-01-07 NOTE — Assessment & Plan Note (Signed)
She is noted to have chronic thrombocytopenia for several years The most likely cause of this is due to her liver disease and splenomegaly She does not need long-term follow-up for this

## 2021-01-07 NOTE — Progress Notes (Signed)
[facility] NOTE  Patient Care Team: Laurey Morale, MD as PCP - General (Family Medicine)  ASSESSMENT & PLAN Leukopenia The reason for referral was leukopenia After her recent surgery, leukopenia has resolved I suspect the cause of this is due to sequestration from splenomegaly She does not need long-term follow-up for this  Thrombocytopenia [facility]) She is noted to have chronic thrombocytopenia for several years The most likely cause of this is due to her liver disease and splenomegaly She does not need long-term follow-up for this  Liver cirrhosis secondary to NASH (nonalcoholic steatohepatitis) ([place]) I have reviewed recent CT imaging with the patient She has signs of liver cirrhosis I explained the pathophysiology for nonalcoholic steatohepatitis With signs of liver to disease at such a young age, I am concerned about her long-term health The cause of this is due to her chronic insulin-dependent diabetes We discussed aggressive approach by treating her underlying risk factors She is interested to be referred to weight management center for further management  Splenomegaly, congestive, chronic Explained the physiology of chronic congestive splenomegaly secondary to liver cirrhosis She needs aggressive treatment for her liver cirrhosis  Type 2 diabetes mellitus treated with insulin ([place]) She has insulin-dependent diabetes, not well controlled The patient have successfully lost weight in the past She will continue current medical management under the care of her primary care doctor  Class II obesity She has class II obesity with serious health issues She is interested to attend medical weight management center  Orders Placed This Encounter  Procedures   Amb Ref to Medical Weight Management    Referral Priority:   Routine    Referral Type:   Consultation    Number of Visits Requested:   1   All questions were answered. The patient knows to call the  clinic with any problems, questions or concerns. No barriers to learning was detected. The total time spent in the appointment was 55 minutes encounter with patients including review of chart and various tests results, discussions about plan of care and coordination of care plan  [name], MD 01/07/2021 9:32 AM  CHIEF COMPLAINTS/PURPOSE OF CONSULTATION:  Leukopenia and recent thrombocytopenia  HISTORY OF PRESENTING ILLNESS:  Laurie Sutton 40 y.o. female is here because of recent leukopenia on blood work drawn prior to hysterectomy She is also noted to have chronic thrombocytopenia After surgery, her leukopenia has resolved We focus our consultation today mainly on her chronic thrombocytopenia The patient has significant history of type 2 diabetes, insulin-dependent, obesity class II and strong family history of diabetes She is currently on very high dose of insulin treatment along with multiple different medications I have reviewed her CBC dated back to 2008 Starting around last year, she is found to have chronic intermittent thrombocytopenia She had CT imaging done in 2016 which showed fatty liver disease Most recently, she was evaluated for possible hernia and CT imaging performed on 11/26/2020 show signs of liver cirrhosis, moderate splenomegaly and presence of varices She had EGD performed by Dr. [name] on February 22, 2020 which showed evidence of gastritis She had received intravenous iron infusion in the past for severe iron deficiency anemia, thought to be related to severe menorrhagia and had hysterectomy recently She denies recent bruising/bleeding, such as spontaneous epistaxis, hematuria, melena or hematochezia She denies prior blood or platelet transfusions The patient is shocked when I told her about recent CT findings She is very tearful throughout the visit when she found out she  have liver cirrhosis She stated that her aunt recently died from diabetes related  complications in her 73A and she had liver cirrhosis as well She has very strong family history of diabetes; she was diagnosed around 51  MEDICAL HISTORY:  Past Medical History:  Diagnosis Date   Anemia    IRON TRANSFUSION 07-2020 NONE SINCE   Anxiety    COVID 08/12/2019   ALL SYMPTOMS REOLVED PER PT   Depression    Diabetic neuropathy ([place]) 11/11/2020   FEET   dm type 2    sees [name]    GERD (gastroesophageal reflux disease)    History of kidney stones    Menorrhagia 11/11/2020   Migraines    Murmur, cardiac    FAINT NO CARDIOLOGIST   Pneumonia 08/12/2019   COVID PNEUMONIA ALL SYMPTOMS RESOLVED PER PT    SURGICAL HISTORY: Past Surgical History:  Procedure Laterality Date   CESAREAN SECTION  12/13/2006   EXTRACORPOREAL SHOCK WAVE LITHOTRIPSY  2018   FOOT SURGERY Right 10/02/2020   RIGHT FOOT HEEL AND TOES, [facility] OFF [address]   LAPAROSCOPIC VAGINAL HYSTERECTOMY WITH SALPINGECTOMY Bilateral 11/17/2020   Procedure: LAPAROSCOPIC ASSISTED VAGINAL HYSTERECTOMY WITH BILATERAL SALPINGECTOMY;  Surgeon: Linda Hedges, DO;  Location: [facility];  Service: Gynecology;  Laterality: Bilateral;    SOCIAL HISTORY: Social History   Socioeconomic History   Marital status: Married    Spouse name: Not on file   Number of children: 1   Years of education: Not on file   Highest education level: Not on file  Occupational History   Not on file  Tobacco Use   Smoking status: Never   Smokeless tobacco: Never  Substance and Sexual Activity   Alcohol use: No    Alcohol/week: 0.0 standard drinks   Drug use: No   Sexual activity: Yes    Birth control/protection: None  Other Topics Concern   Not on file  Social History Narrative   Not on file   Social Determinants of Health   Financial Resource Strain: Not on file  Food Insecurity: Not on file  Transportation Needs: Not on file  Physical Activity: Not on file  Stress: Not on file  Social  Connections: Not on file  Intimate Partner Violence: Not on file    FAMILY HISTORY: Family History  Problem Relation Age of Onset   Hyperlipidemia Mother    Stroke Mother    Diabetes Mother    Migraines Mother    Diabetes Father    Migraines Maternal Aunt    Migraines Maternal Grandmother    Cerebral aneurysm Cousin    Diabetes Maternal Grandfather    Heart disease Neg Hx     ALLERGIES:  is allergic to amoxicillin, azithromycin, dulaglutide, latex, penicillins, and sitagliptin.  MEDICATIONS:  Current Outpatient Medications  Medication Sig Dispense Refill   ALPRAZolam (XANAX) 1 MG tablet TAKE 1 TABLET (1 MG TOTAL) BY MOUTH 2 (TWO) TIMES DAILY AS NEEDED FOR ANXIETY OR SLEEP. 60 tablet 5   clotrimazole-betamethasone (LOTRISONE) cream Apply 1 application topically 2 (two) times daily as needed. 30 g 2   Continuous Blood Gluc Sensor (FREESTYLE LIBRE 14 DAY SENSOR) MISC Apply topically every 14 (fourteen) days.     Cranberry 125 MG TABS Take by mouth.     FLUoxetine (PROZAC) 40 MG capsule TAKE 1 CAPSULE BY MOUTH EVERY DAY 90 capsule 3   gabapentin (NEURONTIN) 100 MG capsule Take 1 capsule (100 mg total) by mouth 3 (three) times  daily. 90 capsule 3   ibuprofen (ADVIL) 800 MG tablet Take 1 tablet (800 mg total) by mouth every 6 (six) hours as needed. 30 tablet 1   insulin glargine (LANTUS SOLOSTAR) 100 UNIT/ML Solostar Pen Inject 130 Units into the skin at bedtime.     insulin lispro (HUMALOG) 100 UNIT/ML KwikPen Inject 50-70 Units into the skin with breakfast, with lunch, and with evening meal.     lamoTRIgine (LAMICTAL) 25 MG tablet TAKE 1 TABLET BY MOUTH EVERYDAY AT BEDTIME 90 tablet 3   metFORMIN (GLUCOPHAGE-XR) 500 MG 24 hr tablet Take 1,500 mg by mouth at bedtime.     montelukast (SINGULAIR) 10 MG tablet TAKE 1 TABLET BY MOUTH EVERYDAY AT BEDTIME 90 tablet 0   nortriptyline (PAMELOR) 10 MG capsule Take 3 capsules (30 mg total) by mouth at bedtime. 270 capsule 3   omeprazole  (PRILOSEC) 40 MG capsule Take 1 capsule (40 mg total) by mouth 2 (two) times daily before a meal. 60 capsule 6   propranolol (INDERAL) 40 MG tablet Take 1 tablet (40 mg total) by mouth 2 (two) times daily. 180 tablet 3   No current facility-administered medications for this visit.    REVIEW OF SYSTEMS:   Constitutional: Denies fevers, chills or abnormal night sweats Eyes: Denies blurriness of vision, double vision or watery eyes Ears, nose, mouth, throat, and face: Denies mucositis or sore throat Respiratory: Denies cough, dyspnea or wheezes Cardiovascular: Denies palpitation, chest discomfort or lower extremity swelling Gastrointestinal:  Denies nausea, heartburn or change in bowel habits.   Skin: Denies abnormal skin rashes Lymphatics: Denies new lymphadenopathy or easy bruising Neurological:Denies numbness, tingling or new weaknesses Behavioral/Psych: Mood is stable, no new changes  All other systems were reviewed with the patient and are negative.  PHYSICAL EXAMINATION: ECOG PERFORMANCE STATUS: 0 - Asymptomatic  Vitals:   01/06/21 1400  BP: (!) 113/48  Pulse: 80  Resp: 18  Temp: 98.1 F (36.7 C)   Filed Weights   01/06/21 1400  Weight: 237 lb (107.5 kg)    GENERAL:alert, no distress and comfortable SKIN: skin color, texture, turgor are normal, no rashes or significant lesions EYES: normal, conjunctiva are pink and non-injected, sclera clear OROPHARYNX:no exudate, no erythema and lips, buccal mucosa, and tongue normal  NECK: supple, thyroid normal size, non-tender, without nodularity LYMPH:  no palpable lymphadenopathy in the cervical, axillary or inguinal LUNGS: clear to auscultation and percussion with normal breathing effort HEART: regular rate & rhythm and no murmurs and no lower extremity edema ABDOMEN:abdomen soft, non-tender and normal bowel sounds.  Noted significant abdominal striae.  Difficult to examine due to central obesity Musculoskeletal:no cyanosis of  digits and no clubbing  PSYCH: alert & oriented x 3 with fluent speech NEURO: no focal motor/sensory deficits  LABORATORY DATA:  I have reviewed the data as listed Lab Results  Component Value Date   WBC 7.2 11/18/2020   HGB 13.4 11/18/2020   HCT 39.8 11/18/2020   MCV 90.5 11/18/2020   PLT 139 (L) 11/18/2020    RADIOGRAPHIC STUDIES: I have reviewed her CT imaging from August with the patient I have personally reviewed the radiological images as listed and agreed with the findings in the report.

## 2021-01-07 NOTE — Assessment & Plan Note (Signed)
Explained the physiology of chronic congestive splenomegaly secondary to liver cirrhosis She needs aggressive treatment for her liver cirrhosis

## 2021-01-07 NOTE — Assessment & Plan Note (Signed)
The reason for referral was leukopenia After her recent surgery, leukopenia has resolved I suspect the cause of this is due to sequestration from splenomegaly She does not need long-term follow-up for this

## 2021-01-07 NOTE — Assessment & Plan Note (Signed)
She has class II obesity with serious health issues She is interested to attend medical weight management center

## 2021-01-07 NOTE — Assessment & Plan Note (Signed)
She has insulin-dependent diabetes, not well controlled The patient have successfully lost weight in the past She will continue current medical management under the care of her primary care doctor

## 2021-01-17 ENCOUNTER — Other Ambulatory Visit: Payer: Self-pay | Admitting: Neurology

## 2021-01-20 ENCOUNTER — Telehealth: Payer: Self-pay

## 2021-01-20 NOTE — Telephone Encounter (Signed)
Returned her call. She is checking on referral to medical weight management. Called medical weight management and ask them to check on referral and call patient for appt. Given Tamari the above info and she verbalized understanding.

## 2021-02-11 ENCOUNTER — Ambulatory Visit: Payer: Commercial Managed Care - PPO | Admitting: Podiatry

## 2021-02-13 ENCOUNTER — Ambulatory Visit: Payer: Commercial Managed Care - PPO | Admitting: Physician Assistant

## 2021-02-15 ENCOUNTER — Other Ambulatory Visit: Payer: Self-pay | Admitting: Family Medicine

## 2021-02-16 ENCOUNTER — Other Ambulatory Visit: Payer: Self-pay

## 2021-02-16 ENCOUNTER — Ambulatory Visit: Payer: Commercial Managed Care - PPO | Admitting: Podiatry

## 2021-02-16 DIAGNOSIS — M722 Plantar fascial fibromatosis: Secondary | ICD-10-CM

## 2021-02-16 MED ORDER — BETAMETHASONE SOD PHOS & ACET 6 (3-3) MG/ML IJ SUSP
3.0000 mg | Freq: Once | INTRAMUSCULAR | Status: AC
  Administered 2021-02-16: 3 mg via INTRA_ARTICULAR

## 2021-02-16 MED ORDER — IBUPROFEN 800 MG PO TABS: 800.0000 mg | ORAL_TABLET | Freq: Four times a day (QID) | ORAL | 1 refills | Status: DC | PRN

## 2021-02-16 MED ORDER — METHYLPREDNISOLONE 4 MG PO TBPK: ORAL_TABLET | ORAL | 0 refills | Status: DC

## 2021-02-16 NOTE — Progress Notes (Signed)
   Subjective: 40 y.o. female presenting for follow-up evaluation and treatment regarding pain and tenderness to the bilateral heels.  She does have history of EPF right foot 10/02/2020.  Patient states that since being back at work she has been very busy and she has been experiencing significant heel pain.  She has been wearing the custom molded orthotics that were prescribed at Dr. Gershon Mussel, local podiatrist's office.  She presents for further treatment and evaluation   Past Medical History:  Diagnosis Date   Anemia    IRON TRANSFUSION 07-2020 NONE SINCE   Anxiety    COVID 08/12/2019   ALL SYMPTOMS REOLVED PER PT   Depression    Diabetic neuropathy ([place]) 11/11/2020   FEET   dm type 2    sees [name]    GERD (gastroesophageal reflux disease)    History of kidney stones    Menorrhagia 11/11/2020   Migraines    Murmur, cardiac    FAINT NO CARDIOLOGIST   Pneumonia 08/12/2019   COVID PNEUMONIA ALL SYMPTOMS RESOLVED PER PT     Objective: Physical Exam General: The patient is alert and oriented x3 in no acute distress.  Dermatology: Skin is warm, dry and supple bilateral lower extremities. Negative for open lesions or macerations bilateral.   Vascular: Dorsalis Pedis and Posterior Tibial pulses palpable bilateral.  Capillary fill time is immediate to all digits.  Neurological: Epicritic and protective threshold intact bilateral.   Musculoskeletal: Tenderness to palpation to the plantar aspect of the bilateral heels along the plantar fascia. All other joints range of motion within normal limits bilateral. Strength 5/5 in all groups bilateral.    Assessment: 1. plantar fasciitis bilateral feet 2. H/o EPF RT. partial nail matricectomy RT LAT hallux. DOS: 10/02/2020  Plan of Care:  1. Patient evaluated. 2. Injection of 0.5cc Celestone soluspan injected into the bilateral heels.  3. Rx for Medrol Dose Pak placed 4. Rx for Motrin 800 mg 3 times daily 5.  OTC power step insoles were  provided for the patient.  The patient has been wearing custom molded orthotics from another local podiatrist however they are very hard and unforgiving.  There is no top-cover or cushioning and I do believe this may be causing some of the patient's heel pain. 6. Instructed patient regarding therapies and modalities at home to alleviate symptoms.  7. Return to clinic in 2 months.  If the patient is not significantly better we may need to remold the patient for new custom molded orthotics.  *Lives in [place].  Works in [employer] at [employer]. Daughter, Ava, 8th Grade.   Edrick Kins, DPM [facility]  Dr. Edrick Kins, DPM    [address],  [address]                Office [phone]  Fax [phone]

## 2021-03-10 ENCOUNTER — Telehealth: Payer: Self-pay | Admitting: Family Medicine

## 2021-03-10 NOTE — Telephone Encounter (Signed)
Disregard

## 2021-03-12 ENCOUNTER — Encounter: Payer: Self-pay | Admitting: Family Medicine

## 2021-03-12 ENCOUNTER — Ambulatory Visit (INDEPENDENT_AMBULATORY_CARE_PROVIDER_SITE_OTHER): Payer: Commercial Managed Care - PPO | Admitting: Family Medicine

## 2021-03-12 VITALS — BP 118/70 | HR 88 | Temp 98.2°F | Wt 237.0 lb

## 2021-03-12 DIAGNOSIS — J40 Bronchitis, not specified as acute or chronic: Secondary | ICD-10-CM | POA: Diagnosis not present

## 2021-03-12 DIAGNOSIS — H6592 Unspecified nonsuppurative otitis media, left ear: Secondary | ICD-10-CM | POA: Diagnosis not present

## 2021-03-12 DIAGNOSIS — J0140 Acute pansinusitis, unspecified: Secondary | ICD-10-CM | POA: Diagnosis not present

## 2021-03-12 MED ORDER — FLUTICASONE PROPIONATE 50 MCG/ACT NA SUSP: 1.0000 | Freq: Every day | NASAL | 0 refills | Status: DC

## 2021-03-12 MED ORDER — DOXYCYCLINE HYCLATE 100 MG PO TABS
100.0000 mg | ORAL_TABLET | Freq: Two times a day (BID) | ORAL | 0 refills | Status: AC
Start: 2021-03-12 — End: 2021-03-19

## 2021-03-12 NOTE — Progress Notes (Signed)
Subjective:    Patient ID: Laurie Sutton, female    DOB: 1980-07-18, 40 y.o.   MRN: [id number]  Chief Complaint  Patient presents with   Cough    Cough, head pressure, chest congestion, and SOB for about 3 weeks. Been taking Mucines sinus max has helped, but when not taking it head pressure gets bad. Having to use inhaler more than normal, once a week    HPI Patient was seen today for ongoing concern.  Patient endorses head pressure, nasal congestion pressure between eyes and behind eyes, dry throat, dry eyes, and nasal drainage x3 weeks.  Tried Mucinex sinus max x3 weeks which helped but symptoms return patient stopped taking medication for a few days.  Patient developed shortness of breath, tightness in chest this week.  Used leftover albuterol inhaler 3 x from COVID infection in 2021.  Pt endorses recent prednisone use for ongoing foot issues.  Also received steroid injection in foot. Past Medical History:  Diagnosis Date   Anemia    IRON TRANSFUSION 07-2020 NONE SINCE   Anxiety    COVID 08/12/2019   ALL SYMPTOMS REOLVED PER PT   Depression    Diabetic neuropathy ([place]) 11/11/2020   FEET   dm type 2    sees [name]    GERD (gastroesophageal reflux disease)    History of kidney stones    Menorrhagia 11/11/2020   Migraines    Murmur, cardiac    FAINT NO CARDIOLOGIST   Pneumonia 08/12/2019   COVID PNEUMONIA ALL SYMPTOMS RESOLVED PER PT    Allergies  Allergen Reactions   Amoxicillin Hives and Itching   Azithromycin     DOES NOT WORK   Dulaglutide Other (See Comments)    JANUVIA STOAMCH HURT?   Latex     IRRITATES VAGINAL AREA   Penicillins Hives    Has patient had a PCN reaction causing immediate rash, facial/tongue/throat swelling, SOB or lightheadedness with hypotension: yes. Rash  Has patient had a PCN reaction causing severe rash involving mucus membranes or skin necrosis: Yes- rash and hives all over body  Has patient had a PCN reaction that required  hospitalization No Has patient had a PCN reaction occurring within the last 10 years: Yes  If all of the above answers are "NO", then may proceed with Cephalosporin use.    Sitagliptin Other (See Comments)    HURTS STOAMCH    ROS General: Denies fever, chills, night sweats, changes in weight, changes in appetite HEENT: Denies headaches, ear pain, changes in vision, rhinorrhea, sore throat + nasal congestion, sinus pressure, congestion, dry throat, dry eyes CV: Denies CP, palpitations, orthopnea  + SOB Pulm: Denies wheezing + chest tightness, coughing, SOB GI: Denies abdominal pain, nausea, vomiting, diarrhea, constipation GU: Denies dysuria, hematuria, frequency, vaginal discharge Msk: Denies muscle cramps, joint pains Neuro: Denies weakness, numbness, tingling Skin: Denies rashes, bruising Psych: Denies depression, anxiety, hallucinations     Objective:    Blood pressure 118/70, pulse 88, temperature 98.2 F (36.8 C), temperature source Oral, weight 237 lb (107.5 kg), last menstrual period 11/13/2020, SpO2 97 %.  Gen. Pleasant, well-nourished, in no distress, normal affect   HEENT: [place]/AT, face symmetric, conjunctiva clear, no scleral icterus, PERRLA, EOMI, nares patent without drainage, pharynx without erythema or exudate.  Left TM full with mild erythema.  Right TM normal parents.  No cervical lymphadenopathy. Lungs: no accessory muscle use, CTAB, no wheezes or rales Cardiovascular: RRR, no m/r/g, no peripheral edema Musculoskeletal: No deformities, no  cyanosis or clubbing, normal tone Neuro:  A&Ox3, CN II-XII intact, normal gait Skin:  Warm, no lesions/ rash   Wt Readings from Last 3 Encounters:  03/12/21 237 lb (107.5 kg)  01/06/21 237 lb (107.5 kg)  11/17/20 233 lb 3.2 oz (105.8 kg)    Lab Results  Component Value Date   WBC 7.2 11/18/2020   HGB 13.4 11/18/2020   HCT 39.8 11/18/2020   PLT 139 (L) 11/18/2020   GLUCOSE 244 (H) 11/18/2020   CHOL 169 08/19/2017    TRIG 202 (A) 08/19/2017   HDL 38 08/19/2017   LDLCALC 91 08/19/2017   ALT 37 11/18/2020   AST 38 11/18/2020   NA 134 (L) 11/18/2020   K 3.6 11/18/2020   CL 97 (L) 11/18/2020   CREATININE 0.58 11/18/2020   BUN 9 11/18/2020   CO2 26 11/18/2020   TSH 0.75 06/13/2019   INR 1.1 (H) 08/25/2020   HGBA1C 9.6 04/27/2019   MICROALBUR 0.93 04/27/2019    Assessment/Plan:  Left serous otitis media, unspecified chronicity -Supportive care with as needed - Plan: doxycycline (VIBRA-TABS) 100 MG tablet, fluticasone (FLONASE) 50 MCG/ACT nasal spray  Subacute pansinusitis -Supportive care including OTC Flonase, steam from shower, rest, hydration -Start doxycycline twice daily  - Plan: doxycycline (VIBRA-TABS) 100 MG tablet, fluticasone (FLONASE) 50 MCG/ACT nasal spray  Bronchitis  -Okay to use albuterol inhaler as needed - Plan: doxycycline (VIBRA-TABS) 100 MG tablet  F/u as needed  Grier Mitts, MD

## 2021-03-20 ENCOUNTER — Other Ambulatory Visit (INDEPENDENT_AMBULATORY_CARE_PROVIDER_SITE_OTHER): Payer: Commercial Managed Care - PPO

## 2021-03-20 ENCOUNTER — Ambulatory Visit (INDEPENDENT_AMBULATORY_CARE_PROVIDER_SITE_OTHER): Payer: Commercial Managed Care - PPO | Admitting: Gastroenterology

## 2021-03-20 ENCOUNTER — Encounter: Payer: Self-pay | Admitting: Gastroenterology

## 2021-03-20 VITALS — BP 116/68 | HR 84 | Ht 66.0 in | Wt 236.2 lb

## 2021-03-20 DIAGNOSIS — K7460 Unspecified cirrhosis of liver: Secondary | ICD-10-CM

## 2021-03-20 LAB — COMPREHENSIVE METABOLIC PANEL
ALT: 39 U/L — ABNORMAL HIGH (ref 0–35)
AST: 51 U/L — ABNORMAL HIGH (ref 0–37)
Albumin: 3.9 g/dL (ref 3.5–5.2)
Alkaline Phosphatase: 121 U/L — ABNORMAL HIGH (ref 39–117)
BUN: 5 mg/dL — ABNORMAL LOW (ref 6–23)
CO2: 24 mEq/L (ref 19–32)
Calcium: 9.5 mg/dL (ref 8.4–10.5)
Chloride: 98 mEq/L (ref 96–112)
Creatinine, Ser: 0.47 mg/dL (ref 0.40–1.20)
GFR: 119.33 mL/min (ref >60.00)
Glucose, Bld: 250 mg/dL — ABNORMAL HIGH (ref 70–99)
Potassium: 3.7 mEq/L (ref 3.5–5.1)
Sodium: 132 mEq/L — ABNORMAL LOW (ref 135–145)
Total Bilirubin: 0.7 mg/dL (ref 0.2–1.2)
Total Protein: 7.1 g/dL (ref 6.0–8.3)

## 2021-03-20 LAB — CBC
HCT: 39.3 % (ref 36.0–46.0)
Hemoglobin: 13.2 g/dL (ref 12.0–15.0)
MCHC: 33.5 g/dL (ref 30.0–36.0)
MCV: 85 fl (ref 78.0–100.0)
Platelets: 115 10*3/uL — ABNORMAL LOW (ref 150.0–400.0)
RBC: 4.63 Mil/uL (ref 3.87–5.11)
RDW: 16 % — ABNORMAL HIGH (ref 11.5–15.5)
WBC: 3.8 10*3/uL — ABNORMAL LOW (ref 4.0–10.5)

## 2021-03-20 LAB — PROTIME-INR
INR: 1.1 ratio — ABNORMAL HIGH (ref 0.8–1.0)
Prothrombin Time: 12.3 s (ref 9.6–13.1)

## 2021-03-20 NOTE — Patient Instructions (Addendum)
If you are age 40 or younger, your body mass index should be between 19-25. Your Body mass index is 38.13 kg/m. If this is out of the aformentioned range listed, please consider follow up with your Primary Care Provider.  ________________________________________________________  The [facility] providers would like to encourage you to use [facility] to communicate with providers for non-urgent requests or questions.  Due to long hold times on the telephone, sending your provider a message by [facility] may be a faster and more efficient way to get a response.  Please allow 48 business hours for a response.  Please remember that this is for non-urgent requests.  _______________________________________________________  Your provider has requested that you go to the basement level for lab work before leaving today. Press "B" on the elevator. The lab is located at the first door on the left as you exit the elevator.  Due to recent changes in healthcare laws, you may see the results of your imaging and laboratory studies on MyChart before your provider has had a chance to review them.  We understand that in some cases there may be results that are confusing or concerning to you. Not all laboratory results come back in the same time frame and the provider may be waiting for multiple results in order to interpret others.  Please give [name] 48 hours in order for your provider to thoroughly review all the results before contacting the office for clarification of your results.   You are scheduled to follow up on 06-03-21 at 9:50am.  Thank you for entrusting me with your care and choosing [facility]  Dr [name]

## 2021-03-20 NOTE — Progress Notes (Signed)
Review of pertinent gastrointestinal problems: 1.  GERD without alarm symptoms evaluated 2016 Dr. [name].  She was drinking multiple (5-10) cans of caffeinated soda daily, also 10-15 peppermint candies daily.  I recommended lifestyle modifications and observation.  She was going to try to wean off of proton pump inhibitor over time as well. 2.  Upper abdominal pain led to EGD November 2021.  Mild distal gastritis was noted, biopsies were negative for H. Pylori. 3.  Likely fatty liver.  Ultrasound September 2016 showed "enlarged liver with increased echogenicity suggestive of steatosis.  Splenomegaly."  CT scan September 2016 showed "fatty infiltration of the liver". Ultrasound of liver with elastography June 2022 showed a median kPa of 4.9.  This falls into the diagnostic category of "high probability of being normal"   HPI: This is a very pleasant 40 year old woman whom I last saw earlier this year.   She underwent a CT scan abdomen with IV and oral contrast August 2022 for indication "evaluate for periumbilical hernia" findings did show indeed a periumbilical hernia but also showed morphologic features compatible with cirrhosis with a nodular liver and gastric, splenic varices.  Blood work August 2022 shows normal liver tests, normal CBC except platelet count of 139  Her weight today is down 4 pounds since it was 7 months ago, same scale here in our office.  She is here today to discuss discrepant imaging, her elastography results suggested that she did not have chronic liver disease or cirrhosis however her CAT scan is fairly different from that.  Her aunt died of cirrhosis complications, sounds like fatty liver, diabetes related.  She has never been a big alcohol drinker  She has truncal obesity but no signs of ascites, edema.  She has not had encephalopathy or jaundice.  ROS: complete GI ROS as described in HPI, all other review negative.  Constitutional:  No unintentional weight  loss   Past Medical History:  Diagnosis Date   Anemia    IRON TRANSFUSION 07-2020 NONE SINCE   Anxiety    COVID 08/12/2019   ALL SYMPTOMS REOLVED PER PT   Depression    Diabetic neuropathy ([place]) 11/11/2020   FEET   dm type 2    sees [name]    GERD (gastroesophageal reflux disease)    History of kidney stones    Menorrhagia 11/11/2020   Migraines    Murmur, cardiac    FAINT NO CARDIOLOGIST   Pneumonia 08/12/2019   COVID PNEUMONIA ALL SYMPTOMS RESOLVED PER PT    Past Surgical History:  Procedure Laterality Date   CESAREAN SECTION  12/13/2006   EXTRACORPOREAL SHOCK WAVE LITHOTRIPSY  2018   FOOT SURGERY Right 10/02/2020   RIGHT FOOT HEEL AND TOES, SUR[facility] OFF [address]   LAPAROSCOPIC VAGINAL HYSTERECTOMY WITH SALPINGECTOMY Bilateral 11/17/2020   Procedure: LAPAROSCOPIC ASSISTED VAGINAL HYSTERECTOMY WITH BILATERAL SALPINGECTOMY;  Surgeon: Linda Hedges, DO;  Location: [facility];  Service: Gynecology;  Laterality: Bilateral;    Current Outpatient Medications  Medication Sig Dispense Refill   ALPRAZolam (XANAX) 1 MG tablet TAKE 1 TABLET (1 MG TOTAL) BY MOUTH 2 (TWO) TIMES DAILY AS NEEDED FOR ANXIETY OR SLEEP. 60 tablet 5   clotrimazole-betamethasone (LOTRISONE) cream Apply 1 application topically 2 (two) times daily as needed. 30 g 2   Continuous Blood Gluc Sensor (FREESTYLE LIBRE 14 DAY SENSOR) MISC Apply topically every 14 (fourteen) days.     Cranberry 125 MG TABS Take by mouth.     FLUoxetine (PROZAC) 40  MG capsule TAKE 1 CAPSULE BY MOUTH EVERY DAY 90 capsule 3   fluticasone (FLONASE) 50 MCG/ACT nasal spray Place 1 spray into both nostrils daily. 16 g 0   gabapentin (NEURONTIN) 100 MG capsule Take 1 capsule (100 mg total) by mouth 3 (three) times daily. 90 capsule 3   insulin glargine (LANTUS SOLOSTAR) 100 UNIT/ML Solostar Pen Inject 130 Units into the skin at bedtime.     insulin lispro (HUMALOG) 100 UNIT/ML KwikPen Inject 50-70 Units into the skin  with breakfast, with lunch, and with evening meal.     lamoTRIgine (LAMICTAL) 25 MG tablet TAKE 1 TABLET BY MOUTH EVERYDAY AT BEDTIME 90 tablet 3   metFORMIN (GLUCOPHAGE-XR) 500 MG 24 hr tablet Take 1,500 mg by mouth at bedtime.     montelukast (SINGULAIR) 10 MG tablet TAKE 1 TABLET BY MOUTH EVERYDAY AT BEDTIME 90 tablet 0   nortriptyline (PAMELOR) 10 MG capsule TAKE 3 CAPSULES (30 MG TOTAL) BY MOUTH AT BEDTIME. 270 capsule 3   omeprazole (PRILOSEC) 40 MG capsule Take 1 capsule (40 mg total) by mouth 2 (two) times daily before a meal. 60 capsule 6   propranolol (INDERAL) 40 MG tablet Take 1 tablet (40 mg total) by mouth 2 (two) times daily. 180 tablet 3   HYDROcodone-acetaminophen (NORCO/VICODIN) 5-325 MG tablet Take 1 tablet by mouth as needed. (Patient not taking: Reported on 03/20/2021)     ibuprofen (ADVIL) 800 MG tablet Take 1 tablet (800 mg total) by mouth every 6 (six) hours as needed. (Patient not taking: Reported on 03/20/2021) 90 tablet 1   No current facility-administered medications for this visit.    Allergies as of 03/20/2021 - Review Complete 03/20/2021  Allergen Reaction Noted   Amoxicillin Hives and Itching 02/16/2011   Azithromycin  07/25/2017   Dulaglutide Other (See Comments) 08/21/2020   Januvia [sitagliptin] Other (See Comments) 11/18/2014   Latex  07/25/2017   Penicillins Hives 02/16/2011    Family History  Problem Relation Age of Onset   Hyperlipidemia Mother    Stroke Mother    Diabetes Mother    Migraines Mother    Diabetes Father    Migraines Maternal Aunt    Migraines Maternal Grandmother    Cerebral aneurysm Cousin    Diabetes Maternal Grandfather    Heart disease Neg Hx     Social History   Socioeconomic History   Marital status: Married    Spouse name: Not on file   Number of children: 1   Years of education: Not on file   Highest education level: Not on file  Occupational History   Not on file  Tobacco Use   Smoking status: Never    Smokeless tobacco: Never  Substance and Sexual Activity   Alcohol use: No    Alcohol/week: 0.0 standard drinks   Drug use: No   Sexual activity: Yes    Birth control/protection: None  Other Topics Concern   Not on file  Social History Narrative   Not on file   Social Determinants of Health   Financial Resource Strain: Not on file  Food Insecurity: Not on file  Transportation Needs: Not on file  Physical Activity: Not on file  Stress: Not on file  Social Connections: Not on file  Intimate Partner Violence: Not on file     Physical Exam: BP 116/68 (BP Location: Left Arm, Patient Position: Sitting, Cuff Size: Normal)   Pulse 84   Ht 5' 6"  (1.676 m) Comment: height measured without shoes  Wt 236 lb 4 oz (107.2 kg)   LMP 11/13/2020 (Exact Date)   BMI 38.13 kg/m  Constitutional: generally well-appearing Psychiatric: alert and oriented x3 Abdomen: soft, nontender, nondistended, no obvious ascites, no peritoneal signs, normal bowel sounds No peripheral edema noted in lower extremities  Assessment and plan: 40 y.o. female with abnormal liver on imaging, likely cirrhosis from fatty liver disease  It is unusual that her ultrasound with elastography of her liver suggested that she was at low probability for chronic liver disease or cirrhosis however her CAT scan images are quite discrepant from that.  I think in truth she probably does indeed have underlying cirrhosis.  Most likely etiology is longstanding fatty liver disease, diabetes.  I am getting a battery of usual blood tests to help with etiology of cirrhosis and rule out other potential causes.  We discussed the fact that I might recommend liver biopsy to be most certain but I hope not to need to proceed to that given the inherent risk of liver biopsy.  She will return to see me in 2 months.  She knows gradual weight loss is the best possible treatment for fatty liver disease and with a BMI of 38 she could probably stand to lose 30  to 50 pounds or so.  Please see the "Patient Instructions" section for addition details about the plan.  Owens Loffler, MD [facility] 03/20/2021, 3:31 PM   Total time on date of encounter was 35 minutes (this included time spent preparing to see the patient reviewing records; obtaining and/or reviewing separately obtained history; performing a medically appropriate exam and/or evaluation; counseling and educating the patient and family if present; ordering medications, tests or procedures if applicable; and documenting clinical information in the health record).

## 2021-03-24 LAB — ANTI-NUCLEAR AB-TITER (ANA TITER): ANA Titer 1: 1:80 {titer} — ABNORMAL HIGH

## 2021-03-24 LAB — HEPATITIS C ANTIBODY
Hepatitis C Ab: NONREACTIVE
SIGNAL TO CUT-OFF: 0.02 (ref <1.00)

## 2021-03-24 LAB — AFP TUMOR MARKER: AFP-Tumor Marker: 3.4 ng/mL

## 2021-03-24 LAB — HEPATITIS A ANTIBODY, TOTAL: Hepatitis A AB,Total: REACTIVE — AB

## 2021-03-24 LAB — HEPATITIS B SURFACE ANTIBODY,QUALITATIVE: Hep B S Ab: NONREACTIVE

## 2021-03-24 LAB — MITOCHONDRIAL ANTIBODIES: Mitochondrial M2 Ab, IgG: <=20 U (ref <=20.0)

## 2021-03-24 LAB — ANA: Anti Nuclear Antibody (ANA): POSITIVE — AB

## 2021-03-24 LAB — HEPATITIS B SURFACE ANTIGEN: Hepatitis B Surface Ag: NONREACTIVE

## 2021-03-24 LAB — ANTI-SMOOTH MUSCLE ANTIBODY, IGG: Actin (Smooth Muscle) Antibody (IGG): 23 U — ABNORMAL HIGH (ref <20)

## 2021-03-24 LAB — ALPHA-1-ANTITRYPSIN: A-1 Antitrypsin, Ser: 173 mg/dL (ref 83–199)

## 2021-03-24 LAB — CERULOPLASMIN: Ceruloplasmin: 27 mg/dL (ref 18–53)

## 2021-03-30 ENCOUNTER — Other Ambulatory Visit: Payer: Self-pay | Admitting: Podiatry

## 2021-03-30 ENCOUNTER — Other Ambulatory Visit: Payer: Self-pay

## 2021-03-30 DIAGNOSIS — K769 Liver disease, unspecified: Secondary | ICD-10-CM

## 2021-04-15 ENCOUNTER — Ambulatory Visit: Payer: Commercial Managed Care - PPO | Admitting: Podiatry

## 2021-04-19 ENCOUNTER — Other Ambulatory Visit: Payer: Self-pay | Admitting: Family Medicine

## 2021-04-20 ENCOUNTER — Other Ambulatory Visit: Payer: Self-pay | Admitting: Family Medicine

## 2021-04-20 NOTE — Telephone Encounter (Signed)
Pt LOV was on 03/12/2021 Last refill done on 08/08/2020 Please advise

## 2021-04-22 ENCOUNTER — Other Ambulatory Visit: Payer: Self-pay

## 2021-04-22 ENCOUNTER — Ambulatory Visit: Payer: Commercial Managed Care - PPO | Admitting: Podiatry

## 2021-04-22 DIAGNOSIS — M722 Plantar fascial fibromatosis: Secondary | ICD-10-CM | POA: Diagnosis not present

## 2021-04-22 MED ORDER — BETAMETHASONE SOD PHOS & ACET 6 (3-3) MG/ML IJ SUSP
3.0000 mg | Freq: Once | INTRAMUSCULAR | Status: AC
  Administered 2021-04-22: 3 mg via INTRA_ARTICULAR

## 2021-04-22 NOTE — Progress Notes (Signed)
° °  Subjective: 41 y.o. female presenting for follow-up evaluation and treatment regarding pain and tenderness to the bilateral heels.  She does have history of EPF right foot 10/02/2020.  Patient states that she is feeling much better today.  She says injections as well as the change in orthotics have helped significantly.  She presents for follow-up treatment and evaluation.   Past Medical History:  Diagnosis Date   Anemia    IRON TRANSFUSION 07-2020 NONE SINCE   Anxiety    COVID 08/12/2019   ALL SYMPTOMS REOLVED PER PT   Depression    Diabetic neuropathy ([place]) 11/11/2020   FEET   dm type 2    sees [name]    GERD (gastroesophageal reflux disease)    History of kidney stones    Menorrhagia 11/11/2020   Migraines    Murmur, cardiac    FAINT NO CARDIOLOGIST   Pneumonia 08/12/2019   COVID PNEUMONIA ALL SYMPTOMS RESOLVED PER PT     Objective: Physical Exam General: The patient is alert and oriented x3 in no acute distress.  Dermatology: Skin is warm, dry and supple bilateral lower extremities. Negative for open lesions or macerations bilateral.   Vascular: Dorsalis Pedis and Posterior Tibial pulses palpable bilateral.  Capillary fill time is immediate to all digits.  Neurological: Epicritic and protective threshold intact bilateral.   Musculoskeletal: Tenderness to palpation to the plantar aspect of the bilateral heels along the plantar fascia. All other joints range of motion within normal limits bilateral. Strength 5/5 in all groups bilateral.    Assessment: 1. plantar fasciitis bilateral feet 2. H/o EPF RT. partial nail matricectomy RT LAT hallux. DOS: 10/02/2020  Plan of Care:  1. Patient evaluated. 2. Injection of 0.5cc Celestone soluspan injected into the right heel.  3.  Continue OTC power step insoles that were provided 4.  Since the patient has had some improvement with OTC power step insoles were going to repeat mold the patient with custom molded orthotics.   Appointment with Pedorthist for orthotics and fitting 5.  Continue Motrin 800 mg 3 times daily as needed 6.  Return to clinic approximately 1 month after orthotics dispensable for follow-up  *Lives in [place].  Works in [employer] at [employer]. Daughter, Ava, 8th Grade.   Edrick Kins, DPM [facility]  Dr. Edrick Kins, DPM    [address], [place] [address]                Office [phone]  Fax [phone]

## 2021-05-04 ENCOUNTER — Other Ambulatory Visit: Payer: Self-pay

## 2021-05-04 ENCOUNTER — Ambulatory Visit (INDEPENDENT_AMBULATORY_CARE_PROVIDER_SITE_OTHER): Payer: Commercial Managed Care - PPO

## 2021-05-04 DIAGNOSIS — Z794 Long term (current) use of insulin: Secondary | ICD-10-CM

## 2021-05-04 DIAGNOSIS — M722 Plantar fascial fibromatosis: Secondary | ICD-10-CM | POA: Diagnosis not present

## 2021-05-05 NOTE — Progress Notes (Signed)
SITUATION Reason for Consult: Evaluation for Bilateral Custom Foot Orthoses Patient / Caregiver Report: Patient is ready for foot orthotics  OBJECTIVE DATA: Patient History / Diagnosis:    ICD-10-CM   1. Plantar fasciitis, bilateral  M72.2     2. Type 2 diabetes mellitus treated with insulin (HCC)  E11.9    Z79.4       Current or Previous Devices: None and no history  Foot Examination: Skin presentation:   Intact Ulcers & [place]:   None and no history Toe / Foot Deformities:  Prominent metheads Weight Bearing Presentation:  Rectus Sensation:    Intact  ORTHOTIC RECOMMENDATION Recommended Device: 1x pair of custom functional foot orthotics  GOALS OF ORTHOSES - Reduce Pain - Prevent Foot Deformity - Prevent Progression of Further Foot Deformity - Relieve Pressure - Improve the Overall Biomechanical Function of the Foot and Lower Extremity.  ACTIONS PERFORMED Patient was casted for Foot Orthoses via crush box. Procedure was explained and patient tolerated procedure well. All questions were answered and concerns addressed.  PLAN Potential out of pocket cost was communicated to patient. Casts are to be sent to [facility] for fabrication. Patient is to be called for fitting when devices are ready.

## 2021-05-05 NOTE — Patient Instructions (Signed)
Patient to be called for scheduling when foot orthotics are ready.

## 2021-05-19 DIAGNOSIS — Z0289 Encounter for other administrative examinations: Secondary | ICD-10-CM

## 2021-06-01 ENCOUNTER — Other Ambulatory Visit: Payer: Self-pay | Admitting: Gastroenterology

## 2021-06-01 ENCOUNTER — Other Ambulatory Visit: Payer: Self-pay

## 2021-06-01 ENCOUNTER — Other Ambulatory Visit: Payer: Self-pay | Admitting: Family Medicine

## 2021-06-01 DIAGNOSIS — J309 Allergic rhinitis, unspecified: Secondary | ICD-10-CM

## 2021-06-01 MED ORDER — MONTELUKAST SODIUM 10 MG PO TABS: ORAL_TABLET | ORAL | 0 refills | Status: DC

## 2021-06-01 NOTE — Telephone Encounter (Signed)
Patient called to get refill on clotrimazole-betamethasone (LOTRISONE) cream Declined appointment at this time, stated she will schedule if they deny prescription.   Please advise

## 2021-06-01 NOTE — Telephone Encounter (Signed)
Last refill-08/11/2016 Last OV- 08/29/2020  No future OV scheduled

## 2021-06-02 MED ORDER — CLOTRIMAZOLE-BETAMETHASONE 1-0.05 % EX CREA: 1.0000 "application " | TOPICAL_CREAM | Freq: Two times a day (BID) | CUTANEOUS | 5 refills | Status: DC | PRN

## 2021-06-03 ENCOUNTER — Encounter: Payer: Self-pay | Admitting: Gastroenterology

## 2021-06-03 ENCOUNTER — Other Ambulatory Visit: Payer: Self-pay

## 2021-06-03 ENCOUNTER — Ambulatory Visit: Payer: Commercial Managed Care - PPO | Admitting: Gastroenterology

## 2021-06-03 VITALS — BP 100/60 | HR 80 | Ht 66.0 in | Wt 232.0 lb

## 2021-06-03 DIAGNOSIS — K7460 Unspecified cirrhosis of liver: Secondary | ICD-10-CM

## 2021-06-03 NOTE — Progress Notes (Signed)
Review of pertinent gastrointestinal problems: 1.  GERD without alarm symptoms evaluated 2016 Dr. [name].  She was drinking multiple (5-10) cans of caffeinated soda daily, also 10-15 peppermint candies daily.  I recommended lifestyle modifications and observation.  She was going to try to wean off of proton pump inhibitor over time as well. 2.  Upper abdominal pain led to EGD November 2021.  Mild distal gastritis was noted, biopsies were negative for H. Pylori. 3.  Likely fatty liver.  ?  Cirrhosis.  Ultrasound September 2016 showed "enlarged liver with increased echogenicity suggestive of steatosis.  Splenomegaly."  CT scan September 2016 showed "fatty infiltration of the liver". Ultrasound of liver with elastography June 2022 showed a median kPa of 4.9.  This falls into the diagnostic category of "high probability of being normal" CT scan abdomen pelvis with IV and oral contrast August 2022 indication evaluate for periumbilical hernia" suggested morphologic signs of cirrhosis. Lab work-up for chronic liver disease December 2022 : hepatitis A total antibody reactive, hepatitis C antibody negative, hepatitis B surface antigen negative, hepatitis B surface antibody negative, ceruloplasmin normal, anti-smooth muscle antibody IgG slightly elevated at 23, ANA positive titer 1:80, AMA negative, alpha-1 antitrypsin level normal, If cirrhosis, MELD 7 based on 12/22 labs: AST, ALT both slightly elevated, platelets 115   HPI: This is a very pleasant 41 year old woman whom I last saw about 2 months ago  Her weight is down 4 pounds since her last visit here 2 months ago  She has had a mild intermittent solid food dysphagia.  She has had no troubles with edema, no obvious encephalopathic events, no overt GI bleeding.  She is having a first visit with weight loss clinic next week.  She is excited about that.   ROS: complete GI ROS as described in HPI, all other review negative.  Constitutional:  No  unintentional weight loss   Past Medical History:  Diagnosis Date   Anemia    IRON TRANSFUSION 07-2020 NONE SINCE   Anxiety    COVID 08/12/2019   ALL SYMPTOMS REOLVED PER PT   Depression    Diabetic neuropathy ([place]) 11/11/2020   FEET   dm type 2    sees [name]    GERD (gastroesophageal reflux disease)    History of kidney stones    Menorrhagia 11/11/2020   Migraines    Murmur, cardiac    FAINT NO CARDIOLOGIST   Pneumonia 08/12/2019   COVID PNEUMONIA ALL SYMPTOMS RESOLVED PER PT    Past Surgical History:  Procedure Laterality Date   CESAREAN SECTION  12/13/2006   EXTRACORPOREAL SHOCK WAVE LITHOTRIPSY  2018   FOOT SURGERY Right 10/02/2020   RIGHT FOOT HEEL AND TOES, SUR[facility] OFF [address]   LAPAROSCOPIC VAGINAL HYSTERECTOMY WITH SALPINGECTOMY Bilateral 11/17/2020   Procedure: LAPAROSCOPIC ASSISTED VAGINAL HYSTERECTOMY WITH BILATERAL SALPINGECTOMY;  Surgeon: Linda Hedges, DO;  Location: [place];  Service: Gynecology;  Laterality: Bilateral;    Current Outpatient Medications  Medication Sig Dispense Refill   ALPRAZolam (XANAX) 1 MG tablet TAKE 1 TABLET BY MOUTH TWICE A DAY AS NEEDED FOR ANXIETY OR SLEEP (Patient taking differently: Take 1 mg by mouth at bedtime.) 60 tablet 5   clotrimazole-betamethasone (LOTRISONE) cream Apply 1 application topically 2 (two) times daily as needed. 30 g 5   Continuous Blood Gluc Sensor (FREESTYLE LIBRE 14 DAY SENSOR) MISC Apply topically every 14 (fourteen) days.     Cranberry 125 MG TABS Take by mouth 2 (two) times daily.  FLUoxetine (PROZAC) 40 MG capsule TAKE 1 CAPSULE BY MOUTH EVERY DAY 90 capsule 3   gabapentin (NEURONTIN) 100 MG capsule Take 1 capsule (100 mg total) by mouth 3 (three) times daily. (Patient taking differently: Take 100 mg by mouth 2 (two) times daily.) 90 capsule 3   insulin glargine (LANTUS SOLOSTAR) 100 UNIT/ML Solostar Pen Inject 130 Units into the skin at bedtime.     insulin lispro  (HUMALOG) 100 UNIT/ML KwikPen Inject 50-70 Units into the skin with breakfast, with lunch, and with evening meal.     lamoTRIgine (LAMICTAL) 25 MG tablet TAKE 1 TABLET BY MOUTH EVERYDAY AT BEDTIME 90 tablet 3   metFORMIN (GLUCOPHAGE-XR) 500 MG 24 hr tablet Take 1,500 mg by mouth at bedtime.     montelukast (SINGULAIR) 10 MG tablet TAKE 1 TABLET BY MOUTH EVERYDAY AT BEDTIME 90 tablet 0   nortriptyline (PAMELOR) 10 MG capsule TAKE 3 CAPSULES (30 MG TOTAL) BY MOUTH AT BEDTIME. 270 capsule 3   omeprazole (PRILOSEC) 40 MG capsule TAKE 1 CAPSULE (40 MG TOTAL) BY MOUTH 2 (TWO) TIMES DAILY BEFORE A MEAL. 180 capsule 2   propranolol (INDERAL) 40 MG tablet Take 1 tablet (40 mg total) by mouth 2 (two) times daily. 180 tablet 3   No current facility-administered medications for this visit.    Allergies as of 06/03/2021 - Review Complete 06/03/2021  Allergen Reaction Noted   Amoxicillin Hives and Itching 02/16/2011   Azithromycin  07/25/2017   Dulaglutide Other (See Comments) 08/21/2020   Januvia [sitagliptin] Other (See Comments) 11/18/2014   Latex  07/25/2017   Penicillins Hives 02/16/2011    Family History  Problem Relation Age of Onset   Hyperlipidemia Mother    Stroke Mother    Diabetes Mother    Migraines Mother    Diabetes Father    Bladder Cancer Father 41   Migraines Maternal Grandmother    Diabetes Maternal Grandfather    Migraines Maternal Aunt    Cerebral aneurysm Cousin    Colon cancer Neg Hx     Social History   Socioeconomic History   Marital status: Married    Spouse name: Not on file   Number of children: 1   Years of education: Not on file   Highest education level: Not on file  Occupational History   Not on file  Tobacco Use   Smoking status: Never   Smokeless tobacco: Never  Substance and Sexual Activity   Alcohol use: No    Alcohol/week: 0.0 standard drinks   Drug use: No   Sexual activity: Yes    Birth control/protection: None  Other Topics Concern    Not on file  Social History Narrative   Not on file   Social Determinants of Health   Financial Resource Strain: Not on file  Food Insecurity: Not on file  Transportation Needs: Not on file  Physical Activity: Not on file  Stress: Not on file  Social Connections: Not on file  Intimate Partner Violence: Not on file     Physical Exam: BP 100/60    Pulse 80    Ht 5' 6"  (1.676 m)    Wt 232 lb (105.2 kg)    LMP 11/13/2020 (Exact Date)    SpO2 97%    BMI 37.45 kg/m  Constitutional: generally well-appearing Psychiatric: alert and oriented x3 Abdomen: soft, nontender, nondistended, no obvious ascites, no peritoneal signs, normal bowel sounds No peripheral edema noted in lower extremities  Assessment and plan: 41 y.o. female with  chronic liver disease, likely cirrhosis from fatty liver disease  I do believe she probably has underlying cirrhosis, last MELD score based on blood work 2 or 3 months ago was low at 7.  Her aunt died of cirrhosis from what sounds like fatty liver disease as well.  Her ANA and her AMA were both slightly elevated I am going to repeat those today.  She also needs a repeat set of labs to restage her chronic liver disease including CBC, complete metabolic profile and coags.  Her last liver imaging was in August 2022 and so I am ordering an ultrasound today for hepatoma screening.  She needs an EGD to screen for varices as well.  I am going to begin vaccinating her for hepatitis B.  Return office visit in 3 to 4 months as well.  Please see the "Patient Instructions" section for addition details about the plan.  Owens Loffler, MD [facility] 06/03/2021, 10:01 AM   Total time on date of encounter was 40 minutes (this included time spent preparing to see the patient reviewing records; obtaining and/or reviewing separately obtained history; performing a medically appropriate exam and/or evaluation; counseling and educating the patient and family if present;  ordering medications, tests or procedures if applicable; and documenting clinical information in the health record).

## 2021-06-03 NOTE — Patient Instructions (Signed)
If you are age 41 or younger, your body mass index should be between 19-25. Your Body mass index is 37.45 kg/m. If this is out of the aformentioned range listed, please consider follow up with your Primary Care Provider.  ________________________________________________________  The [facility] providers would like to encourage you to use [facility] to communicate with providers for non-urgent requests or questions.  Due to long hold times on the telephone, sending your provider a message by [facility] may be a faster and more efficient way to get a response.  Please allow 48 business hours for a response.  Please remember that this is for non-urgent requests.  _______________________________________________________  Your provider has requested that you go to the basement level for lab work before leaving today. Press "B" on the elevator. The lab is located at the first door on the left as you exit the elevator.  You have been scheduled for an endoscopy. Please follow written instructions given to you at your visit today. If you use inhalers (even only as needed), please bring them with you on the day of your procedure.  You have been scheduled for an abdominal ultrasound at [facility] (1st floor of hospital) on 06-10-21 at [place]. Please arrive 15 minutes prior to your appointment for registration. Make certain not to have anything to eat or drink 6 hours prior to your appointment. Should you need to reschedule your appointment, please contact radiology at [phone]. This test typically takes about 30 minutes to perform.  Due to recent changes in healthcare laws, you may see the results of your imaging and laboratory studies on MyChart before your provider has had a chance to review them.  We understand that in some cases there may be results that are confusing or concerning to you. Not all laboratory results come back in the same time frame and the provider may be waiting for multiple results in  order to interpret others.  Please give [name] 48 hours in order for your provider to thoroughly review all the results before contacting the office for clarification of your results.   You will need a follow up appointment in May 2023.  We will contact you to schedule this appointment.  You are scheduled on 06-12-21 at 3pm for Heplisav Injection.  Please continue with weight loss classes.  Keep up the [place]!!!  Thank you for entrusting me with your care and choosing [facility].  Dr [name]

## 2021-06-05 ENCOUNTER — Other Ambulatory Visit (INDEPENDENT_AMBULATORY_CARE_PROVIDER_SITE_OTHER): Payer: Commercial Managed Care - PPO

## 2021-06-05 ENCOUNTER — Encounter: Payer: Self-pay | Admitting: Family Medicine

## 2021-06-05 ENCOUNTER — Telehealth: Payer: Self-pay | Admitting: Family Medicine

## 2021-06-05 ENCOUNTER — Ambulatory Visit: Payer: Commercial Managed Care - PPO | Admitting: Family Medicine

## 2021-06-05 ENCOUNTER — Ambulatory Visit (HOSPITAL_COMMUNITY)
Admission: RE | Admit: 2021-06-05 | Discharge: 2021-06-05 | Disposition: A | Discharge status: Home / Self Care | Payer: Commercial Managed Care - PPO | Source: Ambulatory Visit | Attending: Family Medicine | Admitting: Family Medicine

## 2021-06-05 ENCOUNTER — Other Ambulatory Visit: Payer: Self-pay

## 2021-06-05 VITALS — BP 114/72 | HR 88 | Temp 98.7°F | Ht 66.0 in | Wt 227.8 lb

## 2021-06-05 DIAGNOSIS — R1032 Left lower quadrant pain: Secondary | ICD-10-CM | POA: Diagnosis present

## 2021-06-05 DIAGNOSIS — K7460 Unspecified cirrhosis of liver: Secondary | ICD-10-CM | POA: Diagnosis not present

## 2021-06-05 DIAGNOSIS — K769 Liver disease, unspecified: Secondary | ICD-10-CM | POA: Diagnosis not present

## 2021-06-05 LAB — COMPREHENSIVE METABOLIC PANEL
ALT: 47 U/L — ABNORMAL HIGH (ref 0–35)
AST: 74 U/L — ABNORMAL HIGH (ref 0–37)
Albumin: 4.1 g/dL (ref 3.5–5.2)
Alkaline Phosphatase: 99 U/L (ref 39–117)
BUN: 5 mg/dL — ABNORMAL LOW (ref 6–23)
CO2: 28 mEq/L (ref 19–32)
Calcium: 9.1 mg/dL (ref 8.4–10.5)
Chloride: 100 mEq/L (ref 96–112)
Creatinine, Ser: 0.45 mg/dL (ref 0.40–1.20)
GFR: 120.41 mL/min (ref >60.00)
Glucose, Bld: 265 mg/dL — ABNORMAL HIGH (ref 70–99)
Potassium: 3.3 mEq/L — ABNORMAL LOW (ref 3.5–5.1)
Sodium: 133 mEq/L — ABNORMAL LOW (ref 135–145)
Total Bilirubin: 1.1 mg/dL (ref 0.2–1.2)
Total Protein: 7.4 g/dL (ref 6.0–8.3)

## 2021-06-05 LAB — POCT URINALYSIS DIPSTICK
Bilirubin, UA: NEGATIVE
Blood, UA: NEGATIVE
Glucose, UA: POSITIVE — AB
Ketones, UA: NEGATIVE
Leukocytes, UA: NEGATIVE
Nitrite, UA: NEGATIVE
Spec Grav, UA: 1.02 (ref 1.010–1.025)
pH, UA: 6 (ref 5.0–8.0)

## 2021-06-05 LAB — CBC
HCT: 39.7 % (ref 36.0–46.0)
Hemoglobin: 13.3 g/dL (ref 12.0–15.0)
MCHC: 33.4 g/dL (ref 30.0–36.0)
MCV: 83.5 fl (ref 78.0–100.0)
Platelets: 91 10*3/uL — ABNORMAL LOW (ref 150.0–400.0)
RBC: 4.75 Mil/uL (ref 3.87–5.11)
RDW: 15 % (ref 11.5–15.5)
WBC: 3.2 10*3/uL — ABNORMAL LOW (ref 4.0–10.5)

## 2021-06-05 LAB — PROTIME-INR
INR: 1.2 ratio — ABNORMAL HIGH (ref 0.8–1.0)
Prothrombin Time: 12.8 s (ref 9.6–13.1)

## 2021-06-05 NOTE — Progress Notes (Signed)
° °  Subjective:    Patient ID: Laurie Sutton, female    DOB: 04-03-1981, 41 y.o.   MRN: [id number]  HPI Here for what she thinks may be a kidney stone. She had a right sided kidney stone in September 2016 and this was treated with lithotripsy. Now for the past 2 days she has had left sided pains that remind her of when she had the stone. The pains are sharp and can be severe. They come and go. Most of the time the pain is in the left middle back and left flank, but occasionally it radiates around to the LLQ. She has been nauseated at times but has not vomited. No fever. No urinary urgency or burning. She had a normal BM this morning. She is S/P a hysterectomy. She is not in pain at the moment.    Review of Systems  Constitutional: Negative.   Respiratory: Negative.    Cardiovascular: Negative.   Gastrointestinal:  Positive for abdominal pain and nausea. Negative for abdominal distention, blood in stool, constipation, diarrhea and vomiting.  Genitourinary:  Positive for flank pain. Negative for difficulty urinating, dysuria, frequency, hematuria and urgency.      Objective:   Physical Exam Constitutional:      General: She is not in acute distress.    Appearance: Normal appearance.  Cardiovascular:     Rate and Rhythm: Normal rate and regular rhythm.     Pulses: Normal pulses.     Heart sounds: Normal heart sounds.  Pulmonary:     Effort: Pulmonary effort is normal.     Breath sounds: Normal breath sounds.  Abdominal:     General: Abdomen is flat. Bowel sounds are normal. There is no distension.     Palpations: Abdomen is soft. There is no mass.     Tenderness: There is no right CVA tenderness, left CVA tenderness, guarding or rebound.     Hernia: No hernia is present.     Comments: Mildly tender in the left flank and LLQ   Neurological:     Mental Status: She is alert.          Assessment & Plan:  2 days of left sided pains and nausea that are very likely to be caused by  a renal stone. We will order a stat CT renal stone study and go from there.  Alysia Penna, MD

## 2021-06-05 NOTE — Telephone Encounter (Signed)
waiting on ofc notes to be completed to authorize with pt insurance.

## 2021-06-08 ENCOUNTER — Other Ambulatory Visit: Payer: Self-pay

## 2021-06-08 ENCOUNTER — Telehealth: Payer: Self-pay | Admitting: Family Medicine

## 2021-06-08 ENCOUNTER — Encounter: Payer: Self-pay | Admitting: Family Medicine

## 2021-06-08 ENCOUNTER — Emergency Department (HOSPITAL_COMMUNITY)
Admission: EM | Admit: 2021-06-08 | Discharge: 2021-06-08 | Disposition: A | Discharge status: Home / Self Care | Payer: Commercial Managed Care - PPO | Attending: Emergency Medicine | Admitting: Emergency Medicine

## 2021-06-08 ENCOUNTER — Emergency Department (HOSPITAL_COMMUNITY): Payer: Commercial Managed Care - PPO

## 2021-06-08 ENCOUNTER — Encounter (HOSPITAL_COMMUNITY): Payer: Self-pay | Admitting: Emergency Medicine

## 2021-06-08 DIAGNOSIS — Z7984 Long term (current) use of oral hypoglycemic drugs: Secondary | ICD-10-CM | POA: Diagnosis not present

## 2021-06-08 DIAGNOSIS — R1032 Left lower quadrant pain: Secondary | ICD-10-CM | POA: Insufficient documentation

## 2021-06-08 DIAGNOSIS — E119 Type 2 diabetes mellitus without complications: Secondary | ICD-10-CM | POA: Insufficient documentation

## 2021-06-08 DIAGNOSIS — R102 Pelvic and perineal pain: Secondary | ICD-10-CM

## 2021-06-08 DIAGNOSIS — M545 Low back pain, unspecified: Secondary | ICD-10-CM | POA: Diagnosis not present

## 2021-06-08 DIAGNOSIS — Z91040 Latex allergy status: Secondary | ICD-10-CM | POA: Diagnosis not present

## 2021-06-08 DIAGNOSIS — R935 Abnormal findings on diagnostic imaging of other abdominal regions, including retroperitoneum: Secondary | ICD-10-CM

## 2021-06-08 DIAGNOSIS — Z794 Long term (current) use of insulin: Secondary | ICD-10-CM | POA: Diagnosis not present

## 2021-06-08 DIAGNOSIS — R112 Nausea with vomiting, unspecified: Secondary | ICD-10-CM | POA: Insufficient documentation

## 2021-06-08 DIAGNOSIS — Z20822 Contact with and (suspected) exposure to covid-19: Secondary | ICD-10-CM | POA: Diagnosis not present

## 2021-06-08 DIAGNOSIS — R9389 Abnormal findings on diagnostic imaging of other specified body structures: Secondary | ICD-10-CM | POA: Diagnosis not present

## 2021-06-08 LAB — RESP PANEL BY RT-PCR (FLU A&B, COVID) ARPGX2
Influenza A by PCR: NEGATIVE
Influenza B by PCR: NEGATIVE
SARS Coronavirus 2 by RT PCR: NEGATIVE

## 2021-06-08 LAB — ANTI-NUCLEAR AB-TITER (ANA TITER): ANA Titer 1: 1:320 {titer} — ABNORMAL HIGH

## 2021-06-08 LAB — CBC WITH DIFFERENTIAL/PLATELET
Abs Immature Granulocytes: 0.01 10*3/uL (ref 0.00–0.07)
Basophils Absolute: 0 10*3/uL (ref 0.0–0.1)
Basophils Relative: 1 %
Eosinophils Absolute: 0.1 10*3/uL (ref 0.0–0.5)
Eosinophils Relative: 4 %
HCT: 40 % (ref 36.0–46.0)
Hemoglobin: 13.6 g/dL (ref 12.0–15.0)
Immature Granulocytes: 0 %
Lymphocytes Relative: 25 %
Lymphs Abs: 0.9 10*3/uL (ref 0.7–4.0)
MCH: 28.9 pg (ref 26.0–34.0)
MCHC: 34 g/dL (ref 30.0–36.0)
MCV: 84.9 fL (ref 80.0–100.0)
Monocytes Absolute: 0.3 10*3/uL (ref 0.1–1.0)
Monocytes Relative: 10 %
Neutro Abs: 2.2 10*3/uL (ref 1.7–7.7)
Neutrophils Relative %: 60 %
Platelets: 91 10*3/uL — ABNORMAL LOW (ref 150–400)
RBC: 4.71 MIL/uL (ref 3.87–5.11)
RDW: 13.9 % (ref 11.5–15.5)
WBC: 3.6 10*3/uL — ABNORMAL LOW (ref 4.0–10.5)
nRBC: 0 % (ref 0.0–0.2)

## 2021-06-08 LAB — URINALYSIS, ROUTINE W REFLEX MICROSCOPIC
Bilirubin Urine: NEGATIVE
Glucose, UA: NEGATIVE mg/dL
Ketones, ur: NEGATIVE mg/dL
Nitrite: NEGATIVE
Protein, ur: NEGATIVE mg/dL
Specific Gravity, Urine: 1.003 — ABNORMAL LOW (ref 1.005–1.030)
pH: 7 (ref 5.0–8.0)

## 2021-06-08 LAB — COMPREHENSIVE METABOLIC PANEL
ALT: 39 U/L (ref 0–44)
AST: 55 U/L — ABNORMAL HIGH (ref 15–41)
Albumin: 3.7 g/dL (ref 3.5–5.0)
Alkaline Phosphatase: 107 U/L (ref 38–126)
Anion gap: 10 (ref 5–15)
BUN: <5 mg/dL — ABNORMAL LOW (ref 6–20)
CO2: 23 mmol/L (ref 22–32)
Calcium: 9.4 mg/dL (ref 8.9–10.3)
Chloride: 102 mmol/L (ref 98–111)
Creatinine, Ser: 0.52 mg/dL (ref 0.44–1.00)
GFR, Estimated: >60 mL/min (ref >60)
Glucose, Bld: 204 mg/dL — ABNORMAL HIGH (ref 70–99)
Potassium: 3.5 mmol/L (ref 3.5–5.1)
Sodium: 135 mmol/L (ref 135–145)
Total Bilirubin: 0.8 mg/dL (ref 0.3–1.2)
Total Protein: 7 g/dL (ref 6.5–8.1)

## 2021-06-08 LAB — MITOCHONDRIAL ANTIBODIES: Mitochondrial M2 Ab, IgG: <=20 U (ref <=20.0)

## 2021-06-08 LAB — ANA: Anti Nuclear Antibody (ANA): POSITIVE — AB

## 2021-06-08 MED ORDER — KETOROLAC TROMETHAMINE 10 MG PO TABS
10.0000 mg | ORAL_TABLET | Freq: Four times a day (QID) | ORAL | 0 refills | Status: DC | PRN
Start: 2021-06-08 — End: 2021-07-21

## 2021-06-08 MED ORDER — KETOROLAC TROMETHAMINE 30 MG/ML IJ SOLN
30.0000 mg | Freq: Once | INTRAMUSCULAR | Status: AC
  Administered 2021-06-08: 30 mg via INTRAVENOUS
  Filled 2021-06-08: qty 1

## 2021-06-08 MED ORDER — SODIUM CHLORIDE 0.9 % IV BOLUS
1000.0000 mL | Freq: Once | INTRAVENOUS | Status: AC
  Administered 2021-06-08: 1000 mL via INTRAVENOUS

## 2021-06-08 MED ORDER — HYDROCODONE-ACETAMINOPHEN 5-325 MG PO TABS: 1.0000 | ORAL_TABLET | ORAL | 0 refills | Status: DC | PRN

## 2021-06-08 MED ORDER — ONDANSETRON 4 MG PO TBDP: 4.0000 mg | ORAL_TABLET | Freq: Three times a day (TID) | ORAL | 0 refills | Status: DC | PRN

## 2021-06-08 MED ORDER — KETOROLAC TROMETHAMINE 30 MG/ML IJ SOLN
30.0000 mg | Freq: Once | INTRAMUSCULAR | Status: AC
Start: 2021-06-08 — End: 2021-06-08
  Administered 2021-06-08: 30 mg via INTRAVENOUS
  Filled 2021-06-08: qty 1

## 2021-06-08 MED ORDER — ONDANSETRON HCL 4 MG/2ML IJ SOLN
4.0000 mg | Freq: Once | INTRAMUSCULAR | Status: AC
  Administered 2021-06-08: 4 mg via INTRAVENOUS
  Filled 2021-06-08: qty 2

## 2021-06-08 MED ORDER — HYDROCODONE-ACETAMINOPHEN 7.5-300 MG PO TABS: 1.0000 | ORAL_TABLET | ORAL | 0 refills | Status: DC | PRN

## 2021-06-08 NOTE — Telephone Encounter (Signed)
April is calling and would like to know if US pelvis complete with transvaginal or just trans abd only. April said the next appt is Thursday they do not have anything sooner

## 2021-06-08 NOTE — Telephone Encounter (Signed)
Per Dr.Fry [name] complete with transvaginal.

## 2021-06-08 NOTE — Addendum Note (Signed)
Addended by: Alysia Penna A on: 06/08/2021 08:12 AM   Modules accepted: Orders

## 2021-06-08 NOTE — Telephone Encounter (Signed)
I see she is in the ED at the moment

## 2021-06-08 NOTE — ED Provider Notes (Signed)
[facility] EMERGENCY DEPARTMENT Provider Note   CSN: [id number] Arrival date & time: 06/08/21  7482     History  Chief Complaint  Patient presents with   Back Pain   Nausea    Laurie Sutton is a 41 y.o. female.  Pt is a 41 yo wf with a hx of migraines, anxiety, gerd, dm, kidney stones, and anemia.  Pt said she developed left sided back pain wrapping around to her upper abdomen on Thursday, 2/23.  Her pcp ordered a CT scan which did not show kidney stones, but did show some adnexal abnormalities.  Pt said her pain is worse now and now it is on both sides.  She also has some n/v.  She has no fevers. No cough.  No sob.      Home Medications Prior to Admission medications   Medication Sig Start Date End Date Taking? Authorizing Provider  HYDROcodone-acetaminophen (NORCO/VICODIN) 5-325 MG tablet Take 1 tablet by mouth every 4 (four) hours as needed. 06/08/21  Yes Isla Pence, MD  ketorolac (TORADOL) 10 MG tablet Take 1 tablet (10 mg total) by mouth every 6 (six) hours as needed. 06/08/21  Yes Isla Pence, MD  ondansetron (ZOFRAN-ODT) 4 MG disintegrating tablet Take 1 tablet (4 mg total) by mouth every 8 (eight) hours as needed for nausea or vomiting. 06/08/21  Yes Isla Pence, MD  ALPRAZolam (XANAX) 1 MG tablet TAKE 1 TABLET BY MOUTH TWICE A DAY AS NEEDED FOR ANXIETY OR SLEEP Patient taking differently: Take 1 mg by mouth at bedtime. 04/20/21   Laurey Morale, MD  clotrimazole-betamethasone (LOTRISONE) cream Apply 1 application topically 2 (two) times daily as needed. 06/02/21   Laurey Morale, MD  Continuous Blood Gluc Sensor (FREESTYLE LIBRE 14 DAY SENSOR) MISC Apply topically every 14 (fourteen) days. 12/26/19   [provider]  Cranberry 125 MG TABS Take by mouth 2 (two) times daily.    [provider]  FLUoxetine (PROZAC) 40 MG capsule TAKE 1 CAPSULE BY MOUTH EVERY DAY 01/01/21   Laurey Morale, MD  gabapentin (NEURONTIN) 100 MG capsule  Take 1 capsule (100 mg total) by mouth 3 (three) times daily. Patient taking differently: Take 100 mg by mouth 2 (two) times daily. 12/31/15   Elayne Snare, MD  insulin glargine (LANTUS SOLOSTAR) 100 UNIT/ML Solostar Pen Inject 130 Units into the skin at bedtime.    [provider]  insulin lispro (HUMALOG) 100 UNIT/ML KwikPen Inject 50-70 Units into the skin with breakfast, with lunch, and with evening meal.    [provider]  lamoTRIgine (LAMICTAL) 25 MG tablet TAKE 1 TABLET BY MOUTH EVERYDAY AT BEDTIME 10/24/20   Laurey Morale, MD  metFORMIN (GLUCOPHAGE-XR) 500 MG 24 hr tablet Take 1,500 mg by mouth at bedtime.    [provider]  montelukast (SINGULAIR) 10 MG tablet TAKE 1 TABLET BY MOUTH EVERYDAY AT BEDTIME 06/01/21   Laurey Morale, MD  nortriptyline (PAMELOR) 10 MG capsule TAKE 3 CAPSULES (30 MG TOTAL) BY MOUTH AT BEDTIME. 01/19/21   Kathrynn Ducking, MD  omeprazole (PRILOSEC) 40 MG capsule TAKE 1 CAPSULE (40 MG TOTAL) BY MOUTH 2 (TWO) TIMES DAILY BEFORE A MEAL. 06/01/21   Milus Banister, MD  propranolol (INDERAL) 40 MG tablet Take 1 tablet (40 mg total) by mouth 2 (two) times daily. 09/30/20   Suzzanne Cloud, NP      Allergies    Amoxicillin, Azithromycin, Dulaglutide, Januvia [sitagliptin], Latex, and Penicillins  Review of Systems   Review of Systems  Gastrointestinal:  Positive for abdominal pain.  Genitourinary:  Positive for pelvic pain.  Musculoskeletal:  Positive for back pain.  All other systems reviewed and are negative.  Physical Exam Updated Vital Signs BP (!) 111/53 (BP Location: Right Arm)    Pulse 71    Temp 98.1 F (36.7 C) (Oral)    Resp 18    LMP 11/13/2020 (Exact Date)    SpO2 98%  Physical Exam Vitals and nursing note reviewed.  Constitutional:      Appearance: Normal appearance.  HENT:     Head: Normocephalic and atraumatic.     Right Ear: External ear normal.     Left Ear: External ear normal.     Nose: Nose normal.      Mouth/Throat:     Mouth: Mucous membranes are dry.     Pharynx: Oropharynx is clear.  Eyes:     Extraocular Movements: Extraocular movements intact.     Conjunctiva/sclera: Conjunctivae normal.     Pupils: Pupils are equal, round, and reactive to light.  Cardiovascular:     Rate and Rhythm: Normal rate and regular rhythm.     Pulses: Normal pulses.     Heart sounds: Normal heart sounds.  Pulmonary:     Effort: Pulmonary effort is normal.     Breath sounds: Normal breath sounds.  Abdominal:     General: Abdomen is flat. Bowel sounds are normal.     Palpations: Abdomen is soft.     Tenderness: There is abdominal tenderness in the right lower quadrant, suprapubic area and left lower quadrant.  Musculoskeletal:        General: Normal range of motion.     Cervical back: Normal range of motion and neck supple.  Skin:    General: Skin is warm.     Capillary Refill: Capillary refill takes less than 2 seconds.  Neurological:     General: No focal deficit present.     Mental Status: She is alert and oriented to person, place, and time.  Psychiatric:        Mood and Affect: Mood normal.        Behavior: Behavior normal.    ED Results / Procedures / Treatments   Labs (all labs ordered are listed, but only abnormal results are displayed) Labs Reviewed  COMPREHENSIVE METABOLIC PANEL - Abnormal; Notable for the following components:      Result Value   Glucose, Bld 204 (*)    BUN <5 (*)    AST 55 (*)    All other components within normal limits  CBC WITH DIFFERENTIAL/PLATELET - Abnormal; Notable for the following components:   WBC 3.6 (*)    Platelets 91 (*)    All other components within normal limits  URINALYSIS, ROUTINE W REFLEX MICROSCOPIC - Abnormal; Notable for the following components:   Specific Gravity, Urine 1.003 (*)    Hgb urine dipstick MODERATE (*)    Leukocytes,Ua TRACE (*)    Bacteria, UA RARE (*)    All other components within normal limits  RESP PANEL BY RT-PCR  (FLU A&B, COVID) [id number]    EKG None  Radiology US PELVIC COMPLETE W TRANSVAGINAL AND TORSION R/O  Result Date: 06/08/2021 CLINICAL DATA:  LEFT lower quadrant pain EXAM: TRANSABDOMINAL AND TRANSVAGINAL ULTRASOUND OF PELVIS DOPPLER ULTRASOUND OF OVARIES TECHNIQUE: Both transabdominal and transvaginal ultrasound examinations of the pelvis were performed. Transabdominal technique was performed for global imaging of the pelvis  including uterus, ovaries, adnexal regions, and pelvic cul-de-sac. It was necessary to proceed with endovaginal exam following the transabdominal exam to visualize the ovaries. Color and duplex Doppler ultrasound was utilized to evaluate blood flow to the ovaries. COMPARISON:  CT abdomen and pelvis 06/05/2021 FINDINGS: Uterus Surgically absent Endometrium Surgically absent Right ovary Measurements: 4.7 x 3.0 x 4.2 cm = volume: 30.6 mL. Complicated cyst within RIGHT ovary 2.5 x 2.2 x 2.5 cm containing eccentric internal echogenic material and questionable small debris level. RIGHT finding is consistent with a resolving hemorrhagic corpus luteum. Blood flow present within RIGHT ovary on color Doppler imaging. Left ovary Measurements: 4.8 x 3.6 x 4.4 cm = volume: 38.9 mL. Abnormal grayscale appearance of LEFT ovary, mildly enlarged, heterogeneous in echogenicity with areas of diffuse edema/hypoechogenicity. Scattered follicles, largest 18 mm. Increased blood flow seen within LEFT ovary on color Doppler imaging. Pulsed Doppler evaluation of both ovaries demonstrates arterial and venous waveforms within the LEFT ovary. Due to pain, unable to obtain adequate arterial and venous waveforms in the RIGHT ovary, though blood flow is seen within the RIGHT ovary on color Doppler imaging. Other findings Trace free pelvic fluid, adjacent to LEFT ovary.  No adnexal masses. IMPRESSION: Surgical absence of uterus. Abnormal appearance of the LEFT ovary, mildly enlarged, and heterogeneous in echogenicity with  diffuse edema/hypoechogenicity and hypervascularity of the LEFT ovary. The grayscale appearance of the LEFT ovary is suspicious for ovarian torsion/detorsion, which can be intermittent and demonstrate internal blood flow on color Doppler imaging. Critical Value/emergent results were called by telephone at the time of interpretation on 06/08/2021 at 10:43 am to provider [facility] , who verbally acknowledged these results. Electronically Signed   By: Lavonia Dana M.D.   On: 06/08/2021 10:43    Ct renal on 06/05/21  IMPRESSION: 1. Cholelithiasis. 2. Splenomegaly. 3. Hepatic steatosis. 4. Areas of heterogeneous soft tissue and low attenuation within the posterior aspects of the bilateral adnexa which are likely ovarian in origin. Correlation with pelvic ultrasound is recommended to exclude the presence of an underlying adnexal soft tissue mass.    Procedures Procedures    Medications Ordered in ED Medications  sodium chloride 0.9 % bolus 1,000 mL (0 mLs Intravenous Stopped 06/08/21 1049)  ondansetron (ZOFRAN) injection 4 mg (4 mg Intravenous Given 06/08/21 0916)  ketorolac (TORADOL) 30 MG/ML injection 30 mg (30 mg Intravenous Given 06/08/21 0917)  ketorolac (TORADOL) 30 MG/ML injection 30 mg (30 mg Intravenous Given 06/08/21 1242)    ED Course/ Medical Decision Making/ A&P                           Medical Decision Making Amount and/or Complexity of Data Reviewed Labs: ordered. Radiology: ordered. ECG/medicine tests: ordered.  Risk Prescription drug management.   This patient presents to the ED for concern of abd pain, this involves an extensive number of treatment options, and is a complaint that carries with it a high risk of complications and morbidity.  The differential diagnosis includes kidney stone, ovarian issue, gallstones, uti, infection   Co morbidities that complicate the patient evaluation  Dm, liver disease   Additional history obtained:  Additional history  obtained from epic chart review    Lab Tests:  I Ordered, and personally interpreted labs.  The pertinent results include:  cbc with wbc 3.6 and plt 91.  These are chronically low.  Glucose is elevated at 204.   Imaging Studies ordered:  I ordered imaging  studies including US pelvis  I independently visualized and interpreted imaging which showed possible left ovarian torsion I agree with the radiologist interpretation   Cardiac Monitoring:  The patient was maintained on a cardiac monitor.  I personally viewed and interpreted the cardiac monitored which showed an underlying rhythm of: nsr   Medicines ordered and prescription drug management:  I ordered medication including zofran and toradol  for nausea and pain  Reevaluation of the patient after these medicines showed that the patient improved I have reviewed the patients home medicines and have made adjustments as needed   Consultations Obtained:  I requested consultation with the obgyn, Dr. Runell Gess,  and discussed lab and imaging findings as well as pertinent plan - She did come see pt in the ED.  She reviewed the [name] and is not 100% convinced pt has torsion.  She will set pt up for follow up in the office tomorrow with a repeat US and Dr. Visit.  She is on call until tomorrow morning and told the pt to call her if her sx worsen.   Problem List / ED Course:  Splenomegaly/liver disease/thrombocytopenia, leukopenia are all chronic.  She has seen GI and oncology for these problems.  + ANA recently.  Pt referred to rheumatology. Abd pain:  likely ovarian.  Pain under control.  Obgyn to follow tomorrow.  She knows to return if pain worsens.  It is also possible that there is a small stone as she has some mild hematuria.  However, there is no stone visible and no evidence of obstruction.  Pain under control now.  Pt d/c with lortab, toradol, and zofran.    Reevaluation:  After the interventions noted above, I reevaluated the  patient and found that they have :improved   Dispostion:  After consideration of the diagnostic results and the patients response to treatment, I feel that the patent would benefit from discharge with outpatient f/u.          Final Clinical Impression(s) / ED Diagnoses Final diagnoses:  Left lower quadrant abdominal pain  Abnormal ultrasound of ovary    Rx / DC Orders ED Discharge Orders          Ordered    Ambulatory referral to Rheumatology        06/08/21 1242    HYDROcodone-acetaminophen (NORCO/VICODIN) 5-325 MG tablet  Every 4 hours PRN        06/08/21 1244    ondansetron (ZOFRAN-ODT) 4 MG disintegrating tablet  Every 8 hours PRN        06/08/21 1244    ketorolac (TORADOL) 10 MG tablet  Every 6 hours PRN       Note to Pharmacy: Pt had Toradol in ED.  Kidney function nl.   06/08/21 1244              Isla Pence, MD 06/08/21 1251

## 2021-06-08 NOTE — ED Triage Notes (Signed)
Patient coming from home, complaint of lower back pain and nausea since Thursday. Endorses possible kidney stone, was seen by PCP Friday.

## 2021-06-08 NOTE — Telephone Encounter (Signed)
Tried to call April no reponse. Dr.Fry just advised me that pt is now at the ED.

## 2021-06-08 NOTE — Progress Notes (Signed)
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  She first noticed left upper quadrant pain that radiated down the flank on Thursday.  Since she has a history of kidney stones (10 mm stone on right in the past requiring lithotripsy) she went to her PCP on Friday when the pain persisted.  The workup included a CT scan of abdomen and pelvis - per patient no stone seen and she had a cyst on ?ovary.  She did not have hematuria.  She went home and was PAIN FREE until 3 am this morning when she woke up with bilateral lower abdominal pain that felt like pins and needles. No dysuria or hematuria. Some nausea. No Fever or chills.  In the ED, workup included MODERATE HEMOGLOBIN IN URINE and see labs below. Interestingly, in December she had a positive ANA with 1:80 titre with GI doctor who was working her up for early onset cirrhosis.  Repeat labs on 2/24 ANA positive with 1:320 titre and speckled pattern.  Platelet count steady around 91,000. Liver enzymes seem to be chronically elevated.  Has not seen Rheumatology yet.  Family history - father with Rheumatoid arthritis  Now in the ED - Ultrasound of pelvis - surgically absent uterus. Bilateral ovaries noted - small resolving hemorrhagic cyst on the right with good Doppler blood flow. Left ovarian ?hypervascular and only slightly bigger than the right.  Radiologist was concerned about intermittent torsion even though the left ovary has flow now.  I was called by the ED MD , Dr Gilford Raid. The patient has only been medicated with toradol. She is currently WITHOUT PAIN and is resting comfortably in the gurney.  BP (!) 96/53 (BP Location: Right Arm)    Pulse 73    Temp 98.1 F (36.7 C) (Oral)    Resp 18    LMP 11/13/2020 (Exact Date)    SpO2 98%  Past Medical History:  Diagnosis Date   Anemia    IRON  TRANSFUSION 07-2020 NONE SINCE   Anxiety    COVID 08/12/2019   ALL SYMPTOMS REOLVED PER PT   Depression    Diabetic neuropathy ([place]) 11/11/2020   FEET   dm type 2    sees [name]    GERD (gastroesophageal reflux disease)    History of kidney stones    Menorrhagia 11/11/2020   Migraines    Murmur, cardiac    FAINT NO CARDIOLOGIST   Pneumonia 08/12/2019   COVID PNEUMONIA ALL SYMPTOMS RESOLVED PER PT   Past Surgical History:  Procedure Laterality Date   CESAREAN SECTION  12/13/2006   EXTRACORPOREAL SHOCK WAVE LITHOTRIPSY  2018   FOOT SURGERY Right 10/02/2020   RIGHT FOOT HEEL AND TOES, SUR[facility] OFF [address]   LAPAROSCOPIC VAGINAL HYSTERECTOMY WITH SALPINGECTOMY Bilateral 11/17/2020   Procedure: LAPAROSCOPIC ASSISTED VAGINAL HYSTERECTOMY WITH BILATERAL SALPINGECTOMY;  Surgeon: Linda Hedges, DO;  Location: [place];  Service: Gynecology;  Laterality: Bilateral;   Scheduled Meds: Continuous Infusions: PRN Meds:. Results for orders placed or performed during the hospital encounter of 06/08/21 (from the past 24 hour(s))  Comprehensive metabolic panel     Status: Abnormal   Collection Time: 06/08/21  9:10 AM  Result Value Ref Range   Sodium 135 135 - 145 mmol/L   Potassium 3.5 3.5 - 5.1 mmol/L   Chloride 102  98 - 111 mmol/L   CO2 23 22 - 32 mmol/L   Glucose, Bld 204 (H) 70 - 99 mg/dL   BUN <5 (L) 6 - 20 mg/dL   Creatinine, Ser 0.52 0.44 - 1.00 mg/dL   Calcium 9.4 8.9 - 10.3 mg/dL   Total Protein 7.0 6.5 - 8.1 g/dL   Albumin 3.7 3.5 - 5.0 g/dL   AST 55 (H) 15 - 41 U/L   ALT 39 0 - 44 U/L   Alkaline Phosphatase 107 38 - 126 U/L   Total Bilirubin 0.8 0.3 - 1.2 mg/dL   GFR, Estimated >60 >60 mL/min   Anion gap 10 5 - 15  CBC with Differential     Status: Abnormal   Collection Time: 06/08/21  9:10 AM  Result Value Ref Range   WBC 3.6 (L) 4.0 - 10.5 K/uL   RBC 4.71 3.87 - 5.11 MIL/uL   Hemoglobin 13.6 12.0 - 15.0 g/dL   HCT 40.0 36.0 - 46.0 %   MCV  84.9 80.0 - 100.0 fL   MCH 28.9 26.0 - 34.0 pg   MCHC 34.0 30.0 - 36.0 g/dL   RDW 13.9 11.5 - 15.5 %   Platelets 91 (L) 150 - 400 K/uL   nRBC 0.0 0.0 - 0.2 %   Neutrophils Relative % 60 %   Neutro Abs 2.2 1.7 - 7.7 K/uL   Lymphocytes Relative 25 %   Lymphs Abs 0.9 0.7 - 4.0 K/uL   Monocytes Relative 10 %   Monocytes Absolute 0.3 0.1 - 1.0 K/uL   Eosinophils Relative 4 %   Eosinophils Absolute 0.1 0.0 - 0.5 K/uL   Basophils Relative 1 %   Basophils Absolute 0.0 0.0 - 0.1 K/uL   Immature Granulocytes 0 %   Abs Immature Granulocytes 0.01 0.00 - 0.07 K/uL  Urinalysis, Routine w reflex microscopic Urine, Clean Catch     Status: Abnormal   Collection Time: 06/08/21 11:24 AM  Result Value Ref Range   Color, Urine YELLOW YELLOW   APPearance CLEAR CLEAR   Specific Gravity, Urine 1.003 (L) 1.005 - 1.030   pH 7.0 5.0 - 8.0   Glucose, UA NEGATIVE NEGATIVE mg/dL   Hgb urine dipstick MODERATE (A) NEGATIVE   Bilirubin Urine NEGATIVE NEGATIVE   Ketones, ur NEGATIVE NEGATIVE mg/dL   Protein, ur NEGATIVE NEGATIVE mg/dL   Nitrite NEGATIVE NEGATIVE   Leukocytes,Ua TRACE (A) NEGATIVE   RBC / HPF 0-5 0 - 5 RBC/hpf   WBC, UA 0-5 0 - 5 WBC/hpf   Bacteria, UA RARE (A) NONE SEEN   Squamous Epithelial / LPF 0-5 0 - 5   General alert and oriented Lung CTA Car RRR Abdomen - very soft and non tender and no rebound or guarding  Non distended   IMPRESSION: Abdominal pain Possibly due to small renal stone - with hematuria now  I am not convinced she has ovarian torsion - even if intermittent her pain would be more significant and findings on ultrasound do not support that. Autoimmune disease ? No clear diagnosis yet DM  PLAN: Observe longer in ED  If pain is insignificant - would send home with follow up with DR Lynnette Caffey with an ultrasound tomorrow Consider urology input because there might be stone fragment irritating the bladder Discussed plan in detail with Dr Gilford Raid - recommend rheumatology  consult as outpatient to determine exact diagnosis / treatment plan for patient.

## 2021-06-08 NOTE — ED Notes (Signed)
Patient transported to Ultrasound 

## 2021-06-08 NOTE — Discharge Instructions (Signed)
If your pain worsens, first call Dr. Helane Rima who is on call your OBGyn.  If you are unable to get ahold of her, come back to the ED.

## 2021-06-09 ENCOUNTER — Other Ambulatory Visit: Payer: Self-pay

## 2021-06-09 ENCOUNTER — Encounter: Payer: Self-pay | Admitting: Gastroenterology

## 2021-06-09 ENCOUNTER — Ambulatory Visit (INDEPENDENT_AMBULATORY_CARE_PROVIDER_SITE_OTHER): Payer: Commercial Managed Care - PPO | Admitting: Family Medicine

## 2021-06-09 ENCOUNTER — Encounter (INDEPENDENT_AMBULATORY_CARE_PROVIDER_SITE_OTHER): Payer: Self-pay | Admitting: Family Medicine

## 2021-06-09 VITALS — BP 106/62 | HR 78 | Temp 98.1°F | Ht 67.0 in | Wt 230.0 lb

## 2021-06-09 DIAGNOSIS — R5383 Other fatigue: Secondary | ICD-10-CM

## 2021-06-09 DIAGNOSIS — Z1331 Encounter for screening for depression: Secondary | ICD-10-CM

## 2021-06-09 DIAGNOSIS — G43819 Other migraine, intractable, without status migrainosus: Secondary | ICD-10-CM | POA: Diagnosis not present

## 2021-06-09 DIAGNOSIS — K7460 Unspecified cirrhosis of liver: Secondary | ICD-10-CM

## 2021-06-09 DIAGNOSIS — R0602 Shortness of breath: Secondary | ICD-10-CM

## 2021-06-09 DIAGNOSIS — E1169 Type 2 diabetes mellitus with other specified complication: Secondary | ICD-10-CM

## 2021-06-09 DIAGNOSIS — E119 Type 2 diabetes mellitus without complications: Secondary | ICD-10-CM | POA: Insufficient documentation

## 2021-06-09 DIAGNOSIS — K7581 Nonalcoholic steatohepatitis (NASH): Secondary | ICD-10-CM

## 2021-06-09 DIAGNOSIS — Z794 Long term (current) use of insulin: Secondary | ICD-10-CM

## 2021-06-09 DIAGNOSIS — F39 Unspecified mood [affective] disorder: Secondary | ICD-10-CM

## 2021-06-09 DIAGNOSIS — Z9189 Other specified personal risk factors, not elsewhere classified: Secondary | ICD-10-CM | POA: Diagnosis not present

## 2021-06-09 DIAGNOSIS — Z6836 Body mass index (BMI) 36.0-36.9, adult: Secondary | ICD-10-CM

## 2021-06-09 DIAGNOSIS — E669 Obesity, unspecified: Secondary | ICD-10-CM

## 2021-06-09 NOTE — Telephone Encounter (Signed)
Pt has been seen in ED and pt is aware disregard the phone call

## 2021-06-10 ENCOUNTER — Telehealth: Payer: Self-pay | Admitting: Gastroenterology

## 2021-06-10 ENCOUNTER — Ambulatory Visit (HOSPITAL_COMMUNITY): Payer: Commercial Managed Care - PPO

## 2021-06-10 LAB — FOLATE: Folate: 5.6 ng/mL (ref >3.0)

## 2021-06-10 LAB — VITAMIN D 25 HYDROXY (VIT D DEFICIENCY, FRACTURES): Vit D, 25-Hydroxy: 12.8 ng/mL — ABNORMAL LOW (ref 30.0–100.0)

## 2021-06-10 LAB — T4, FREE: Free T4: 1.06 ng/dL (ref 0.82–1.77)

## 2021-06-10 LAB — TSH: TSH: 1.17 u[IU]/mL (ref 0.450–4.500)

## 2021-06-10 LAB — VITAMIN B12: Vitamin B-12: 499 pg/mL (ref 232–1245)

## 2021-06-10 NOTE — Telephone Encounter (Signed)
Inbound call from patient states she was recently in the hospital 2/27 and had scans done. Is asking a call back to let her know if she should continue to have Ultrasound scan 3/3 or if what was done at the hospital was enough images? ?

## 2021-06-10 NOTE — Progress Notes (Signed)
Chief Complaint:   Laurie Sutton (MR# [id number]) is a 41 y.o. female who presents for evaluation and treatment of obesity and related comorbidities. Current BMI is Body mass index is 36.02 kg/m. Laurie Sutton has been struggling with her weight for many years and has been unsuccessful in either losing weight, maintaining weight loss, or reaching her healthy weight goal.  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 She lives with her husband 1, son 60, and daughter 35; and her mom and dad both live with them. She craves sweets and bread. Snacks are cookies, chips, candy/sweets. Worst habit is snacking in between meals. FLP and A1c was done on 03/23/2021 with [name] and recently had CBC, CMP with [place]. Notes were reviewed.  Laurie Sutton is currently in the action stage of change and ready to dedicate time achieving and maintaining a healthier weight. Laurie Sutton is interested in becoming our patient and working on intensive lifestyle modifications including (but not limited to) diet and exercise for weight loss.  Laurie Sutton's habits were reviewed today and are as follows: Her family eats meals together, she thinks her family will eat healthier with her, her desired weight loss is 80 lbs, she started gaining weight after kids, her heaviest weight ever was 235 pounds, she is a picky eater and doesn't like to eat healthier foods, she has significant food cravings issues, she snacks frequently in the evenings, she skips meals frequently, she is frequently drinking liquids with calories, she frequently makes poor food choices, she has problems with excessive hunger, she frequently eats larger portions than normal, and she struggles with emotional eating.  Depression Screen Laurie Sutton's Food and Mood (modified PHQ-9) score was 8.  Depression screen [facility] 2/9 06/09/2021  Decreased Interest 1  Down, Depressed, Hopeless 1  PHQ - 2 Score 2  Altered sleeping  0  Tired, decreased energy 3  Change in appetite 1  Feeling bad or failure about yourself  1  Trouble concentrating 1  Moving slowly or fidgety/restless 0  Suicidal thoughts 0  PHQ-9 Score 8  Difficult doing work/chores Not difficult at all  Some recent data might be hidden   Subjective:   1. Other fatigue Laurie Sutton admits to daytime somnolence and admits to waking up still tired. Patient has a history of symptoms of daytime fatigue, morning fatigue, and morning headache. Laurie Sutton generally gets 6 or 7 hours of sleep per night, and states that she has generally restful sleep. Snoring is present. Apneic episodes are not present. Epworth Sleepiness Score is 14. Per the patient has trouble falling asleep and takes Xanax and lamotrigine. She denies snoring or choking or other concerns. No breath holding. Husband has sleep apnea and she is sure she doesn't.   2. Shortness of breath on exertion Laurie Sutton notes increasing shortness of breath with exercising and seems to be worsening over time with weight gain. She notes getting out of breath sooner with activity than she used to. This has not gotten worse recently. Laurie Sutton denies shortness of breath at rest or orthopnea.  3. Type 2 diabetes mellitus with other specified complication, with long-term current use of insulin (HCC) Laurie Sutton's fasting blood sugar ranges between 152-203, 2 hour post prandial ranges between 241-350 or so. She is taking Humalog, Lantus, and metformin. Diagnosed in 2015, and treatment per [name] at [facility] that she sees every 3 months. No hypertension or hyperlipidemia. Last A1c was 8.2 on 03/23/2021.  -  She stated that her aunt, in 2022, died from diabetes related complications in her 80K and she had liver cirrhosis as well She has very strong family history of diabetes  4. Other migraine  Laurie Sutton is on propranolol and nortriptyline for migraines. She was on Topamax and went off but can't remember why. Her Neurologist is through  Laurie Sutton, Butler Denmark, NP treats the patient.   5. Liver cirrhosis secondary to NASH (nonalcoholic steatohepatitis) (HCC) Laurie Sutton has a history of GERD as well, omeprazole daily, and is well controlled.  - No extensive ETOH intake.   - She has a history of Hep B, and she is on injection medications for that. She sees Dr. [name] of GI.  6. Mood disorder ([place]) with emotional eating Laurie Sutton notes a h/o depression, anxiety, and ADHD. PHQ score is 8. She is taking Prozac and Xanax, and is well controlled. She endorses emotional eating. She doesn't have a Social worker. She feels her emotions are well controlled and mostly anxiety symptoms currently.  7. At risk for heart disease Laurie Sutton is at higher than average risk for cardiovascular disease due to NASH, diabetes mellitus, and lifestyle.    Assessment/Plan:   Orders Placed This Encounter  Procedures   Folate   Vitamin B12   T4, free   TSH   VITAMIN D 25 Hydroxy (Vit-D Deficiency, Fractures)   EKG 12-Lead    Medications Discontinued During This Encounter  Medication Reason   omeprazole (PRILOSEC) 40 MG capsule      1. Other fatigue Laurie Sutton does feel that her weight is causing her energy to be lower than it should be. Fatigue may be related to obesity, depression or many other causes. Labs will be ordered, and in the meanwhile, Laurie Sutton will focus on self care including making healthy food choices, increasing physical activity and focusing on stress reduction. - She had received intravenous iron infusion in the past for severe iron deficiency anemia, thought to be related to severe menorrhagia and subsequently had a hysterectomy on 11/17/20 She denies recent bruising/bleeding She denies prior blood or platelet transfusions However, we will check CBC, B12, folate levels as well  - Folate - Vitamin B12 - T4, free - TSH - VITAMIN D 25 Hydroxy (Vit-D Deficiency, Fractures) - EKG 12-Lead  2. Shortness of breath on exertion Laurie Sutton  does feel that she gets out of breath more easily that she used to when she exercises. Tonishia's shortness of breath appears to be obesity related and exercise induced. She has agreed to work on weight loss and gradually increase exercise to treat her exercise induced shortness of breath. Will continue to monitor closely.  3. Type 2 diabetes mellitus with other specified complication, with long-term current use of insulin (HCC) We will check labs today. Kamauri will continue medication management per Endocrinology. We will focus intensively on the meal plan and lifestyle modifications. She is with [facility] and can get readings instantly. She is aware more frequent monitoring may be needed with weight loss.   - T4, free - TSH - VITAMIN D 25 Hydroxy (Vit-D Deficiency, Fractures)  4. Other migraine  Jerzi's migraines are under good control, rarely ever uses rescue or as needed medications. She will continue medication management per Sutton. We will follow up along with Neuro/ treating specialists.   5. Liver cirrhosis secondary to NASH (nonalcoholic steatohepatitis) ([place]) The pt underwent a CT scan abdomen with IV and oral contrast August 2022 for indication "evaluate for periumbilical hernia".  The findings did show a  periumbilical hernia but also showed morphologic features compatible with cirrhosis with a nodular liver and gastric, splenic varices Pt was subsequently seen by GI- Dr [name] on 03/20/21 who told her the most likely etiology is longstanding fatty liver disease & diabetes. She was told by both her Hem-Onc Dr Marland KitchenAlvy Bimler and her GI Dr [name] that gradual weight loss is the best possible treatment for fatty liver disease and they both recommended this program to the pt. - goal is to follow her prudent nutritional plan and lose weight - again I stressed importance of eating healthy and wt loss for both her DM and NASH  6. Mood disorder ([place]) with emotional eating Khloie is stable, no need for  counseling per the patient even after a discussion of the benefits of it.  She denies that EE is currently an issue for her and declines treatment at this time.  - We will follow closely.  - She will continue mood medications management per PCP.   7. Screening for depression Toshie had a positive depression screening. Depression is commonly associated with obesity and often results in emotional eating behaviors. We will monitor this closely and work on CBT to help improve the non-hunger eating patterns. Referral to Psychology may be required if no improvement is seen as she continues in our clinic.  8. At risk for heart disease Due to Makita's current state of health and medical condition(s), she is at a higher risk for heart disease.  This puts the patient at much greater risk to subsequently develop cardiopulmonary conditions that can significantly affect patient's quality of life in a negative manner.    At least 23 minutes were spent on counseling Neiva about these concerns today, and I stressed the importance of reversing risks factors associated with obesity, especially truncal and visceral fat, hypertension, hyperlipidemia, and diabetes.  The initial goal is to lose at least 5-10% of starting weight to help reduce these risk factors.  Counseling:  Intensive lifestyle modifications were discussed with Anylah as the most appropriate first line of treatment.  she will continue to work on diet, exercise, and weight loss efforts.  We will continue to reassess these conditions on a fairly regular basis in an attempt to decrease the patient's overall morbidity and mortality.  Evidence-based interventions for health behavior change were utilized today including the discussion of self monitoring techniques, problem-solving barriers, and SMART goal setting techniques.  Specifically, regarding patient's less desirable eating habits and patterns, we employed the technique of small changes when Harris has not  been able to fully commit to her prudent nutritional plan.  9. Obesity with current BMI of 36.1 Neilani is currently in the action stage of change and her goal is to continue with weight loss efforts. I recommend Dallis begin the structured treatment plan as follows:  She has agreed to the Category 3 Plan.  Exercise goals: As is.   Behavioral modification strategies: increasing lean protein intake, decreasing simple carbohydrates, and planning for success.  She was informed of the importance of frequent follow-up visits to maximize her success with intensive lifestyle modifications for her multiple health conditions. She was informed we would discuss her lab results at her next visit unless there is a critical issue that needs to be addressed sooner. Dondi agreed to keep her next visit at the agreed upon time to discuss these results.  Objective:   Blood pressure 106/62, pulse 78, temperature 98.1 F (36.7 C), height 5' 7"  (1.702 m), weight 230 lb (104.3  kg), last menstrual period 11/13/2020, SpO2 96 %. Body mass index is 36.02 kg/m.  EKG: Normal sinus rhythm, rate 75 BPM.  Indirect Calorimeter completed today shows a VO2 of 350 and a REE of 2419.  Her calculated basal metabolic rate is 1275 thus her basal metabolic rate is better than expected.  General: Cooperative, alert, well developed, in no acute distress. HEENT: Conjunctivae and lids unremarkable. Cardiovascular: Regular rhythm.  Lungs: Normal work of breathing. Neurologic: No focal deficits.    Lab Results  Component Value Date   CREATININE 0.52 06/08/2021   BUN <5 (L) 06/08/2021   NA 135 06/08/2021   K 3.5 06/08/2021   CL 102 06/08/2021   CO2 23 06/08/2021   Lab Results  Component Value Date   ALT 39 06/08/2021   AST 55 (H) 06/08/2021   ALKPHOS 107 06/08/2021   BILITOT 0.8 06/08/2021   Lab Results  Component Value Date   HGBA1C 9.6 04/27/2019   HGBA1C 6.2 08/19/2017   HGBA1C 6.9 (H) 06/07/2016   HGBA1C 7.5  (H) 12/30/2015   HGBA1C 6.0 08/21/2015   No results found for: INSULIN Lab Results  Component Value Date   TSH 1.170 06/09/2021   Lab Results  Component Value Date   CHOL 169 08/19/2017   HDL 38 08/19/2017   LDLCALC 91 08/19/2017   TRIG 202 (A) 08/19/2017   CHOLHDL 4 05/12/2015   Lab Results  Component Value Date   WBC 3.6 (L) 06/08/2021   HGB 13.6 06/08/2021   HCT 40.0 06/08/2021   MCV 84.9 06/08/2021   PLT 91 (L) 06/08/2021   No results found for: IRON, TIBC, FERRITIN  Attestation Statements:   Reviewed by clinician on day of visit: allergies, medications, problem list, medical history, surgical history, family history, social history, and previous encounter notes.   Wilhemena Durie, am acting as transcriptionist for [employer], DO.  I have reviewed the above documentation for accuracy and completeness, and I agree with the above. -Marjory Sneddon, D.O.  The [place] was signed into law in 2016 which includes the topic of electronic health records.  This provides immediate access to information in MyChart.  This includes consultation notes, operative notes, office notes, lab results and pathology reports.  If you have any questions about what you read please let us know at your next visit so we can discuss your concerns and take corrective action if need be.  We are right here with you.

## 2021-06-10 NOTE — Telephone Encounter (Signed)
I have advised the pt to proceed with [name] as planned.  She had a vaginal [name].  She needs to have the [name] for Dr [name] to look at the liver.  ? ?She will proceed with [name] as scheduled  ?

## 2021-06-12 ENCOUNTER — Other Ambulatory Visit: Payer: Self-pay

## 2021-06-12 ENCOUNTER — Other Ambulatory Visit: Payer: Commercial Managed Care - PPO

## 2021-06-12 ENCOUNTER — Ambulatory Visit (INDEPENDENT_AMBULATORY_CARE_PROVIDER_SITE_OTHER): Payer: Commercial Managed Care - PPO | Admitting: Gastroenterology

## 2021-06-12 ENCOUNTER — Ambulatory Visit (HOSPITAL_COMMUNITY)
Admission: RE | Admit: 2021-06-12 | Discharge: 2021-06-12 | Disposition: A | Discharge status: Home / Self Care | Payer: Commercial Managed Care - PPO | Source: Ambulatory Visit | Attending: Gastroenterology | Admitting: Gastroenterology

## 2021-06-12 DIAGNOSIS — K7581 Nonalcoholic steatohepatitis (NASH): Secondary | ICD-10-CM

## 2021-06-12 DIAGNOSIS — Z23 Encounter for immunization: Secondary | ICD-10-CM

## 2021-06-12 DIAGNOSIS — K7460 Unspecified cirrhosis of liver: Secondary | ICD-10-CM

## 2021-06-12 DIAGNOSIS — K760 Fatty (change of) liver, not elsewhere classified: Secondary | ICD-10-CM

## 2021-06-12 NOTE — Progress Notes (Signed)
Pt stated that the ultrasound was already done. Dr.Fry was aware of this.

## 2021-06-15 ENCOUNTER — Encounter: Payer: Self-pay | Admitting: Gastroenterology

## 2021-06-15 ENCOUNTER — Other Ambulatory Visit: Payer: Self-pay

## 2021-06-15 ENCOUNTER — Ambulatory Visit (HOSPITAL_COMMUNITY)
Admission: RE | Admit: 2021-06-15 | Discharge: 2021-06-15 | Disposition: A | Discharge status: Home / Self Care | Payer: Commercial Managed Care - PPO | Source: Ambulatory Visit | Attending: Gastroenterology | Admitting: Gastroenterology

## 2021-06-15 DIAGNOSIS — K7460 Unspecified cirrhosis of liver: Secondary | ICD-10-CM | POA: Insufficient documentation

## 2021-06-17 ENCOUNTER — Telehealth: Payer: Self-pay | Admitting: Podiatry

## 2021-06-17 NOTE — Telephone Encounter (Signed)
Lvm for patient to call and schedule an appointment for orthotics  ?

## 2021-06-18 LAB — PROTEIN ELECTROPHORESIS, SERUM
Albumin ELP: 4 g/dL (ref 3.8–4.8)
Alpha 1: 0.3 g/dL (ref 0.2–0.3)
Alpha 2: 0.6 g/dL (ref 0.5–0.9)
Beta 2: 0.4 g/dL (ref 0.2–0.5)
Beta Globulin: 0.5 g/dL (ref 0.4–0.6)
Gamma Globulin: 1.2 g/dL (ref 0.8–1.7)
Total Protein: 7 g/dL (ref 6.1–8.1)

## 2021-06-23 ENCOUNTER — Encounter (INDEPENDENT_AMBULATORY_CARE_PROVIDER_SITE_OTHER): Payer: Self-pay

## 2021-06-23 ENCOUNTER — Ambulatory Visit (INDEPENDENT_AMBULATORY_CARE_PROVIDER_SITE_OTHER): Payer: Commercial Managed Care - PPO | Admitting: Family Medicine

## 2021-07-07 ENCOUNTER — Encounter (INDEPENDENT_AMBULATORY_CARE_PROVIDER_SITE_OTHER): Payer: Self-pay | Admitting: Family Medicine

## 2021-07-07 ENCOUNTER — Ambulatory Visit (INDEPENDENT_AMBULATORY_CARE_PROVIDER_SITE_OTHER): Payer: Commercial Managed Care - PPO | Admitting: Family Medicine

## 2021-07-07 ENCOUNTER — Other Ambulatory Visit: Payer: Self-pay

## 2021-07-07 VITALS — BP 110/68 | HR 68 | Temp 97.6°F | Ht 67.0 in | Wt 231.0 lb

## 2021-07-07 DIAGNOSIS — K7460 Unspecified cirrhosis of liver: Secondary | ICD-10-CM | POA: Diagnosis not present

## 2021-07-07 DIAGNOSIS — E1169 Type 2 diabetes mellitus with other specified complication: Secondary | ICD-10-CM

## 2021-07-07 DIAGNOSIS — E559 Vitamin D deficiency, unspecified: Secondary | ICD-10-CM

## 2021-07-07 DIAGNOSIS — E786 Lipoprotein deficiency: Secondary | ICD-10-CM

## 2021-07-07 DIAGNOSIS — Z794 Long term (current) use of insulin: Secondary | ICD-10-CM

## 2021-07-07 DIAGNOSIS — Z9189 Other specified personal risk factors, not elsewhere classified: Secondary | ICD-10-CM

## 2021-07-07 DIAGNOSIS — E781 Pure hyperglyceridemia: Secondary | ICD-10-CM

## 2021-07-07 DIAGNOSIS — K7581 Nonalcoholic steatohepatitis (NASH): Secondary | ICD-10-CM

## 2021-07-07 DIAGNOSIS — Z6836 Body mass index (BMI) 36.0-36.9, adult: Secondary | ICD-10-CM

## 2021-07-07 MED ORDER — VITAMIN D (ERGOCALCIFEROL) 1.25 MG (50000 UNIT) PO CAPS: 50000.0000 [IU] | ORAL_CAPSULE | ORAL | 0 refills | Status: DC

## 2021-07-08 ENCOUNTER — Telehealth: Payer: Self-pay | Admitting: Gastroenterology

## 2021-07-08 NOTE — Telephone Encounter (Signed)
The pt has been advised that Dr [name] will discuss with her at her EGD this week. ?

## 2021-07-08 NOTE — Telephone Encounter (Signed)
Dr [name] I do not see where you ordered a referral to [facility].  Do you want her to see a surgeon? She has an EGD appt on 3/31.  Please advise  ?

## 2021-07-08 NOTE — Telephone Encounter (Signed)
Patient called stating she was in a lot of pain due to her gallstones would like to know if a referral is going to be put in for her to see a surgeon or what is the next steps  ?

## 2021-07-10 ENCOUNTER — Encounter: Payer: Self-pay | Admitting: Gastroenterology

## 2021-07-10 ENCOUNTER — Ambulatory Visit (AMBULATORY_SURGERY_CENTER): Payer: Commercial Managed Care - PPO | Admitting: Gastroenterology

## 2021-07-10 VITALS — BP 105/55 | HR 74 | Temp 98.7°F | Resp 17 | Ht 66.0 in | Wt 232.0 lb

## 2021-07-10 DIAGNOSIS — K2960 Other gastritis without bleeding: Secondary | ICD-10-CM

## 2021-07-10 DIAGNOSIS — I8510 Secondary esophageal varices without bleeding: Secondary | ICD-10-CM

## 2021-07-10 DIAGNOSIS — K7581 Nonalcoholic steatohepatitis (NASH): Secondary | ICD-10-CM | POA: Diagnosis not present

## 2021-07-10 DIAGNOSIS — K2970 Gastritis, unspecified, without bleeding: Secondary | ICD-10-CM | POA: Diagnosis not present

## 2021-07-10 DIAGNOSIS — K7460 Unspecified cirrhosis of liver: Secondary | ICD-10-CM

## 2021-07-10 DIAGNOSIS — K31819 Angiodysplasia of stomach and duodenum without bleeding: Secondary | ICD-10-CM

## 2021-07-10 MED ORDER — SODIUM CHLORIDE 0.9 % IV SOLN: 500.0000 mL | Freq: Once | INTRAVENOUS | Status: DC

## 2021-07-10 NOTE — Progress Notes (Signed)
HPI: ?This is a woman with compensated cirrhosis, here for varices screening ? ? ?ROS: complete GI ROS as described in HPI, all other review negative. ? ?Constitutional:  No unintentional weight loss ? ? ?Past Medical History:  ?Diagnosis Date  ? ADHD   ? Anemia   ? IRON TRANSFUSION 07-2020 NONE SINCE  ? Anxiety   ? Back pain   ? COVID 08/12/2019  ? ALL SYMPTOMS REOLVED PER PT  ? Depression   ? Diabetic neuropathy ([place]) 11/11/2020  ? FEET  ? dm type 2   ? sees [name]   ? Fatty liver   ? GERD (gastroesophageal reflux disease)   ? History of kidney stones   ? Joint pain   ? Menorrhagia 11/11/2020  ? Migraines   ? Murmur, cardiac   ? FAINT NO CARDIOLOGIST  ? Other fatigue   ? Pneumonia 08/12/2019  ? COVID PNEUMONIA ALL SYMPTOMS RESOLVED PER PT  ? Shortness of breath on exertion   ? ? ?Past Surgical History:  ?Procedure Laterality Date  ? CESAREAN SECTION  12/13/2006  ? EXTRACORPOREAL SHOCK WAVE LITHOTRIPSY  2018  ? FOOT SURGERY Right 10/02/2020  ? RIGHT FOOT HEEL AND TOES, SURGICAL [facility]ENTER OFF [address]  ? LAPAROSCOPIC VAGINAL HYSTERECTOMY WITH SALPINGECTOMY Bilateral 11/17/2020  ? Procedure: LAPAROSCOPIC ASSISTED VAGINAL HYSTERECTOMY WITH BILATERAL SALPINGECTOMY;  Surgeon: Linda Hedges, DO;  Location: [facility];  Service: Gynecology;  Laterality: Bilateral;  ? ? ?Current Outpatient Medications  ?Medication Sig Dispense Refill  ? ALPRAZolam (XANAX) 1 MG tablet TAKE 1 TABLET BY MOUTH TWICE A DAY AS NEEDED FOR ANXIETY OR SLEEP 60 tablet 5  ? clotrimazole-betamethasone (LOTRISONE) cream Apply 1 application topically 2 (two) times daily as needed. 30 g 5  ? Continuous Blood Gluc Sensor (FREESTYLE LIBRE 14 DAY SENSOR) MISC Apply topically every 14 (fourteen) days.    ? Cranberry 125 MG TABS Take by mouth 2 (two) times daily.    ? FLUoxetine (PROZAC) 40 MG capsule TAKE 1 CAPSULE BY MOUTH EVERY DAY 90 capsule 3  ? gabapentin (NEURONTIN) 100 MG capsule Take 1 capsule (100 mg total) by mouth 3 (three) times  daily. 90 capsule 3  ? HYDROcodone-Acetaminophen 7.5-300 MG TABS Take 1 tablet by mouth every 4 (four) hours as needed. 10 tablet 0  ? insulin glargine (LANTUS SOLOSTAR) 100 UNIT/ML Solostar Pen Inject 130 Units into the skin at bedtime.    ? insulin lispro (HUMALOG) 100 UNIT/ML KwikPen Inject 50-70 Units into the skin with breakfast, with lunch, and with evening meal.    ? ketorolac (TORADOL) 10 MG tablet Take 1 tablet (10 mg total) by mouth every 6 (six) hours as needed. 10 tablet 0  ? lamoTRIgine (LAMICTAL) 25 MG tablet TAKE 1 TABLET BY MOUTH EVERYDAY AT BEDTIME 90 tablet 3  ? metFORMIN (GLUCOPHAGE-XR) 500 MG 24 hr tablet Take 1,500 mg by mouth at bedtime.    ? montelukast (SINGULAIR) 10 MG tablet TAKE 1 TABLET BY MOUTH EVERYDAY AT BEDTIME 90 tablet 0  ? nortriptyline (PAMELOR) 10 MG capsule TAKE 3 CAPSULES (30 MG TOTAL) BY MOUTH AT BEDTIME. 270 capsule 3  ? omeprazole (PRILOSEC) 40 MG capsule Take 40 mg by mouth in the morning.    ? ondansetron (ZOFRAN-ODT) 4 MG disintegrating tablet Take 1 tablet (4 mg total) by mouth every 8 (eight) hours as needed for nausea or vomiting. 20 tablet 0  ? propranolol (INDERAL) 40 MG tablet Take 1 tablet (40 mg total) by mouth 2 (two)  times daily. 180 tablet 3  ? Vitamin D, Ergocalciferol, (DRISDOL) 1.25 MG (50000 UNIT) CAPS capsule Take 1 capsule (50,000 Units total) by mouth every 7 (seven) days. 4 capsule 0  ? ?Current Facility-Administered Medications  ?Medication Dose Route Frequency Provider Last Rate Last Admin  ? 0.9 %  sodium chloride infusion  500 mL Intravenous Once Milus Banister, MD      ? ? ?Allergies as of 07/10/2021 - Review Complete 07/10/2021  ?Allergen Reaction Noted  ? Amoxicillin Hives and Itching 02/16/2011  ? Azithromycin  07/25/2017  ? Dulaglutide Other (See Comments) 08/21/2020  ? Januvia [sitagliptin] Other (See Comments) 11/18/2014  ? Latex  07/25/2017  ? Penicillins Hives 02/16/2011  ? ? ?Family History  ?Problem Relation Age of Onset  ? Sleep apnea  Mother   ? Anxiety disorder Mother   ? Depression Mother   ? Heart disease Mother   ? Hyperlipidemia Mother   ? Stroke Mother   ? Diabetes Mother   ? Migraines Mother   ? Sleep apnea Father   ? Anxiety disorder Father   ? Depression Father   ? Cancer Father   ? Diabetes Father   ? Bladder Cancer Father 65  ? Migraines Maternal Grandmother   ? Diabetes Maternal Grandfather   ? Migraines Maternal Aunt   ? Cerebral aneurysm Cousin   ? Heart disease Other   ? Depression Other   ? Anxiety disorder Other   ? Sleep apnea Other   ? Colon cancer Neg Hx   ? ? ?Social History  ? ?Socioeconomic History  ? Marital status: Married  ?  Spouse name: Quita Skye  ? Number of children: 2  ? Years of education: Not on file  ? Highest education level: 12th grade  ?Occupational History  ? Occupation: Food and Nutrition  ?  Employer: [employer]  ?  Comment: works 20 hours per week  ?Tobacco Use  ? Smoking status: Never  ? Smokeless tobacco: Never  ?Vaping Use  ? Vaping Use: Never used  ?Substance and Sexual Activity  ? Alcohol use: No  ?  Alcohol/week: 0.0 standard drinks  ? Drug use: No  ? Sexual activity: Yes  ?  Partners: Male  ?  Birth control/protection: None  ?Other Topics Concern  ? Not on file  ?Social History Narrative  ? Not on file  ? ?Social Determinants of Health  ? ?Financial Resource Strain: Low Risk   ? Difficulty of Paying Living Expenses: Not hard at all  ?Food Insecurity: No Food Insecurity  ? Worried About Charity fundraiser in the Last Year: Never true  ? Ran Out of Food in the Last Year: Never true  ?Transportation Needs: No Transportation Needs  ? Lack of Transportation (Medical): No  ? Lack of Transportation (Non-Medical): No  ?Physical Activity: Insufficiently Active  ? Days of Exercise per Week: 2 days  ? Minutes of Exercise per Session: 10 min  ?Stress: Stress Concern Present  ? Feeling of Stress : To some extent  ?Social Connections: Socially Integrated  ? Frequency of Communication with Friends and  Family: More than three times a week  ? Frequency of Social Gatherings with Friends and Family: More than three times a week  ? Attends Religious Services: More than 4 times per year  ? Active Member of Clubs or Organizations: Yes  ? Attends Archivist Meetings: More than 4 times per year  ? Marital Status: Married  ?Intimate Partner Violence: Not  on file  ? ? ? ?Physical Exam: ?BP (!) 109/52   Pulse 71   Temp 98.7 ?F (37.1 ?C)   Ht 5' 6"  (1.676 m)   Wt 232 lb (105.2 kg)   LMP 11/13/2020 (Exact Date)   SpO2 98%   BMI 37.45 kg/m?  ?Constitutional: generally well-appearing ?Psychiatric: alert and oriented x3 ?Lungs: CTA bilaterally ?Heart: no MCR ? ?Assessment and plan: ?41 y.o. female with cirrhosis ? ?Screening for varices ? ?Care is appropriate for the ambulatory setting. ? ?Owens Loffler, MD ?[facility] ?07/10/2021, 9:17 AM ? ? ? ?

## 2021-07-10 NOTE — Progress Notes (Signed)
Called to room to assist during endoscopic procedure.  Patient ID and intended procedure confirmed with present staff. Received instructions for my participation in the procedure from the performing physician.  

## 2021-07-10 NOTE — Op Note (Signed)
[place] ?Patient Name: Laurie Sutton ?Procedure Date: 07/10/2021 9:32 AM ?MRN: [id number] ?Endoscopist: Milus Banister , MD ?Age: 41 ?Referring MD:  ?Date of Birth: 18-May-1980 ?Gender: Female ?Account #: [id number] ?Procedure:                Upper GI endoscopy ?Indications:              Heartburn ?Medicines:                Monitored Anesthesia Care ?Procedure:                Pre-Anesthesia Assessment: ?                          - Prior to the procedure, a History and Physical  ?                          was performed, and patient medications and  ?                          allergies were reviewed. The patient's tolerance of  ?                          previous anesthesia was also reviewed. The risks  ?                          and benefits of the procedure and the sedation  ?                          options and risks were discussed with the patient.  ?                          All questions were answered, and informed consent  ?                          was obtained. Prior Anticoagulants: The patient has  ?                          taken no previous anticoagulant or antiplatelet  ?                          agents. ASA Grade Assessment: III - A patient with  ?                          severe systemic disease. After reviewing the risks  ?                          and benefits, the patient was deemed in  ?                          satisfactory condition to undergo the procedure. ?                          After obtaining informed consent, the endoscope was  ?  passed under direct vision. Throughout the  ?                          procedure, the patient's blood pressure, pulse, and  ?                          oxygen saturations were monitored continuously. The  ?                          Endoscope was introduced through the mouth, and  ?                          advanced to the second part of duodenum. The upper  ?                          GI endoscopy was accomplished without  difficulty.  ?                          The patient tolerated the procedure well. ?Scope In: ?Scope Out: ?Findings:                 Small distal esopahgus varices, two trunks without  ?                          any signs of recent or impending bleeding. ?                          Mild portal gastropathy changes throughout the  ?                          stomach, predominantly in the proximal stomach. ?                          Nodular mild gastritis in the antrum (perhaps  ?                          nodular portal gastropathy). Biopsies taken to  ?                          check for H. pylori. ?                          No gastric varices. ?                          The exam was otherwise without abnormality. ?Complications:            No immediate complications. Estimated blood loss:  ?                          None. ?Estimated Blood Loss:     Estimated blood loss: none. ?Impression:               - Small distal esopahgus varices, two trunks  ?  without any signs of recent or impending bleeding. ?                          - Mild portal gastropathy changes throughout the  ?                          stomach, predominantly in the proximal stomach. ?                          - Nodular mild gastritis in the antrum (perhaps  ?                          nodular portal gastropathy). Biopsies taken to  ?                          check for H. pylori. ?                          - No gastric varices. ?                          - The examination was otherwise normal. ?Recommendation:           - Patient has a contact number available for  ?                          emergencies. The signs and symptoms of potential  ?                          delayed complications were discussed with the  ?                          patient. Return to normal activities tomorrow.  ?                          Written discharge instructions were provided to the  ?                          patient. ?                          -  Resume previous diet. ?                          - Continue present medications. ?                          - Await pathology results. Will consider starting  ?                          non-selective BBlocker for the small esophageal  ?                          varices noted today. ?                          - Dr.  [name]' office will help arrange CT scan  ?                          abd/pelvis with IV and oral contrast for your left  ?                          sided and right sided lower abd pains. ?                          - Dr. [name]' office will also see if your July new  ?                          visit with Rheumatology can be moved sooner. ?                          - OV with Dr. [name] in 2 months. ?Milus Banister, MD ?07/10/2021 9:59:11 AM ?This report has been signed electronically. ?

## 2021-07-10 NOTE — Patient Instructions (Signed)
Thank you for letting us take care of your healthcare needs today. ?Please see handouts given to you on Gastritis. ?Await Dr. [name] office to call with appointment for your CT scan. ?Please make your 2 month follow up visit With Dr. [name] . ? ? ? ?YOU HAD AN ENDOSCOPIC PROCEDURE TODAY AT [facility]:   Refer to the procedure report that was given to you for any specific questions about what was found during the examination.  If the procedure report does not answer your questions, please call your gastroenterologist to clarify.  If you requested that your care partner not be given the details of your procedure findings, then the procedure report has been included in a sealed envelope for you to review at your convenience later. ? ?YOU SHOULD EXPECT: Some feelings of bloating in the abdomen. Passage of more gas than usual.  Walking can help get rid of the air that was put into your GI tract during the procedure and reduce the bloating. If you had a lower endoscopy (such as a colonoscopy or flexible sigmoidoscopy) you may notice spotting of blood in your stool or on the toilet paper. If you underwent a bowel prep for your procedure, you may not have a normal bowel movement for a few days. ? ?Please Note:  You might notice some irritation and congestion in your nose or some drainage.  This is from the oxygen used during your procedure.  There is no need for concern and it should clear up in a day or so. ? ?SYMPTOMS TO REPORT IMMEDIATELY: ? ? ?Following upper endoscopy (EGD) ? Vomiting of blood or coffee ground material ? New chest pain or pain under the shoulder blades ? Painful or persistently difficult swallowing ? New shortness of breath ? Fever of 100?F or higher ? Black, tarry-looking stools ? ?For urgent or emergent issues, a gastroenterologist can be reached at any hour by calling [phone]. ?Do not use MyChart messaging for urgent concerns.  ? ? ?DIET:  We do recommend a small meal at  first, but then you may proceed to your regular diet.  Drink plenty of fluids but you should avoid alcoholic beverages for 24 hours. ? ?ACTIVITY:  You should plan to take it easy for the rest of today and you should NOT DRIVE or use heavy machinery until tomorrow (because of the sedation medicines used during the test).   ? ?FOLLOW UP: ?Our staff will call the number listed on your records 48-72 hours following your procedure to check on you and address any questions or concerns that you may have regarding the information given to you following your procedure. If we do not reach you, we will leave a message.  We will attempt to reach you two times.  During this call, we will ask if you have developed any symptoms of COVID 19. If you develop any symptoms (ie: fever, flu-like symptoms, shortness of breath, cough etc.) before then, please call [phone].  If you test positive for Covid 19 in the 2 weeks post procedure, please call and report this information to [name].   ? ?If any biopsies were taken you will be contacted by phone or by letter within the next 1-3 weeks.  Please call us at [phone] if you have not heard about the biopsies in 3 weeks.  ? ? ?SIGNATURES/CONFIDENTIALITY: ?You and/or your care partner have signed paperwork which will be entered into your electronic medical record.  These signatures attest to the  fact that that the information above on your After Visit Summary has been reviewed and is understood.  Full responsibility of the confidentiality of this discharge information lies with you and/or your care-partner.  ?

## 2021-07-10 NOTE — Progress Notes (Signed)
0936 Robinul 0.1 mg IV given due large amount of secretions upon assessment.  MD made aware, vss  ?

## 2021-07-10 NOTE — Progress Notes (Signed)
Report given to PACU, vss 

## 2021-07-13 ENCOUNTER — Ambulatory Visit (INDEPENDENT_AMBULATORY_CARE_PROVIDER_SITE_OTHER): Payer: Commercial Managed Care - PPO | Admitting: Gastroenterology

## 2021-07-13 DIAGNOSIS — Z23 Encounter for immunization: Secondary | ICD-10-CM | POA: Diagnosis not present

## 2021-07-13 DIAGNOSIS — K7460 Unspecified cirrhosis of liver: Secondary | ICD-10-CM

## 2021-07-14 ENCOUNTER — Telehealth: Payer: Self-pay | Admitting: *Deleted

## 2021-07-14 ENCOUNTER — Other Ambulatory Visit: Payer: Self-pay

## 2021-07-14 ENCOUNTER — Telehealth: Payer: Self-pay

## 2021-07-14 DIAGNOSIS — R1032 Left lower quadrant pain: Secondary | ICD-10-CM

## 2021-07-14 DIAGNOSIS — R1031 Right lower quadrant pain: Secondary | ICD-10-CM

## 2021-07-14 DIAGNOSIS — R109 Unspecified abdominal pain: Secondary | ICD-10-CM

## 2021-07-14 DIAGNOSIS — R1030 Lower abdominal pain, unspecified: Secondary | ICD-10-CM

## 2021-07-14 NOTE — Telephone Encounter (Signed)
First post procedure follow up call, no answer 

## 2021-07-14 NOTE — Telephone Encounter (Signed)
No answer for post procedure call back. Left Voicemail. ?

## 2021-07-16 NOTE — Progress Notes (Signed)
? ? ? ?Chief Complaint:  ? ?OBESITY ?Laurie Sutton is here to discuss her progress with her obesity treatment plan along with follow-up of her obesity related diagnoses. [name] is on the Category 3 Plan and states she has not been consistent following her eating plan currently. [name] states she is not exercising regularly. ? ?Today's visit was #: 2 ?Starting weight: 230 lbs ?Starting date: 06/09/2021 ?Today's weight: 231 lbs ?Today's date: 07/07/2021 ?Total lbs lost to date: 0 ?Total lbs lost since last in-office visit: 0 ? ?Interim History: Laurie Sutton is here today for her first follow-up office visit since starting the program with [name].  All blood work/ lab tests that were recently ordered by myself or an outside provider were reviewed with patient today per their request.   Extended time was spent counseling her on all new disease processes that were discovered or preexisting ones that are affected by BMI.  she understands that many of these abnormalities will need to monitored regularly along with the current treatment plan of prudent dietary changes, in which we are making each and every office visit, to improve these health parameters. ? ?Following program for breakfast and lunch!  For dinner and snacking, she is not on plan! "Busy lifestyle and just was not hungry initially.  Now feels hungry, but eats out at [employer] or [employer] or [place] or [employer].  Not meal prepping at all, which she says she likes to do and is the key to her eating out less. ? ?Subjective:  ? ?1. Type 2 diabetes mellitus with other specified complication, with long-term current use of insulin ([place]) ?Worsening.  Discussed labs with patient today. Most recent A1c was 8.7 on 06/22/2021 with Endo. ?Treated by [name].  A1c on 03/23/2021 was 8.2.  BS 200s and had 1 low in the 50s.  She is awaiting Victoza approval from insurance - [name] prescribed it. ? ?2. Hypertriglyceridemia ?New.  Discussed labs with patient today.  ?TG 530,  HDL 35, LDL 55 on 05/24/2021.  Asymptomatic and without concerns. ? ?3. Low HDL (under 40) ?New.  Discussed labs with patient today.  ?HDL 35.  Arturo with no regular exercise.  She does eat fish and foods with Omega 3 and Omega 6 fatty acids, but not a lot.  No fish oil supplement. ? ?4. Vitamin D deficiency ?New.  Discussed labs with patient today.  ?Laurie Sutton endorses fatigue.  No history of MVI or vitamin D use in the past. ? ?5. Liver cirrhosis secondary to NASH (nonalcoholic steatohepatitis) ([name]) ?Discussed labs with patient today.  ?Recent CMP on 06/08/2021 reviewed with patient. ?Has several specialists she sees who told her she has this due to poorly controlled diabetes and poor lifestyle with obesity. ? ?6. At risk for hyperglycemia ?[name] has a history of some elevated blood glucose readings without a diagnosis of diabetes.  ? ?Assessment/Plan:  ?No orders of the defined types were placed in this encounter. ? ? ?There are no discontinued medications.  ? ?Meds ordered this encounter  ?Medications  ? Vitamin D, Ergocalciferol, (DRISDOL) 1.25 MG (50000 UNIT) CAPS capsule  ?  Sig: Take 1 capsule (50,000 Units total) by mouth every 7 (seven) days.  ?  Dispense:  4 capsule  ?  Refill:  0  ?  ? ?1. Type 2 diabetes mellitus with other specified complication, with long-term current use of insulin ([place]) ?Treatment and medication management per Endo.  A1c poorly controlled.  Dr. [name] labs were reviewed in [place]  and discussed with the patient.  Counseled patient on pathophysiology of disease and discussed how good blood sugar control is important to decrease the risk of diabetic complications such as nephropathy, neuropathy, limb loss, blindness, coronary artery disease, etc.   ?Intensive lifestyle modification including diet, exercise and weight loss are the first line of treatment for diabetes. We extensively discussed the importance of decreasing simple carbs and how certain foods they eat will affect their blood  sugars ?Reminded [name] if she feels poorly- check Blood Sugar and Blood Pressure at that time.  Bring in BS and BP logs each OV.   Eat on a regular basis- no skipping or going long periods without eating.  We discussed hypoglycemia prevention.   Any concerns about medicines should be directed at the prescribing provider ?- Recheck labs in 3 months if not done at Endo provider / PCP.  ?- Importance of f/up with PCP and all other specialists, as scheduled, was stressed to the patient today  ? ?Lab Results  ?Component Value Date  ? HGBA1C 9.6 04/27/2019  ? HGBA1C 6.2 08/19/2017  ? HGBA1C 6.9 (H) 06/07/2016  ? ?Lab Results  ?Component Value Date  ? MICROALBUR 0.93 04/27/2019  ? [name] 91 08/19/2017  ? CREATININE 0.52 06/08/2021  ?  ?2. Hypertriglyceridemia ?Counseled her on condition and decreasing fatty carbs is crucial for her health and wellbeing.  Discussed that with these elevated levels, she is putting herself at risk for pancreatitis. ? ?3. Low HDL (under 40) ?Extensive education done on importance of increasing activity eventually to increase HDL.  Continue prudent nutritional plan and focus on lean meats and increase seafood per her meal plan.  Recheck in 4-6 months. ? ?4. Vitamin D deficiency ?Start ergocalciferol 50,000 IU once weekly. ?- I discussed the importance of vitamin D to the patient's health and well-being.  ?- I reviewed possible symptoms of low Vitamin D:  low energy, depressed mood, muscle aches, joint aches, osteoporosis etc. was reviewed with patient ?- low Vitamin D levels may be linked to an increased risk of cardiovascular events and even increased risk of cancers- such as colon and breast.  ?- ideal vitamin D levels reviewed with patient  ?- I recommend pt take a weekly prescription vit D - see script below   ?- Informed patient this may be a lifelong thing, and she was encouraged to continue to take the medicine until told otherwise.    ?- weight loss will likely improve  availability of vitamin D, thus encouraged [name] to continue with meal plan and their weight loss efforts to further improve this condition.  Thus, we will need to monitor levels regularly (every 3-4 mo on average) to keep levels within normal limits and prevent over supplementation. ?- pt's questions and concerns regarding this condition addressed. ? ?- Start Vitamin D, Ergocalciferol, (DRISDOL) 1.25 MG (50000 UNIT) CAPS capsule; Take 1 capsule (50,000 Units total) by mouth every 7 (seven) days.  Dispense: 4 capsule; Refill: 0 ? ?5. Liver cirrhosis secondary to NASH (nonalcoholic steatohepatitis) ([place]) ?Stable liver enzymes, improved slightly from prior lab draw.  Continue with prudent nutritional plan and lose weight.  Continue with GI and other specialists as per their recommendations for monitoring of this condition.  ? ?6. At risk for hyperglycemia ?Heer was given approximately 24 minutes of counseling today regarding prevention of hyperglycemia. She was advised of hyperglycemia causes and the fact hyperglycemia is often asymptomatic. Ronetta was instructed to avoid skipping meals, eat regular protein  rich meals and schedule low calorie but protein rich snacks as needed.  ? ?Repetitive spaced learning was employed today to elicit superior memory formation and behavioral change  ? ?7. Obesity with current BMI of 36.3 ? ?Julina is currently in the action stage of change. As such, her goal is to continue with weight loss efforts. She has agreed to the Category 3 Plan.  ? ?Exercise goals: As is. ? ?Behavioral modification strategies: increasing lean protein intake, decreasing simple carbohydrates, increasing high fiber foods, decreasing eating out, and meal planning and cooking strategies.  Healthier ways to make the healthy things.  She and family like also discussed in detail. ? ?Latanga has agreed to follow-up with our clinic in 2-3 weeks. She was informed of the importance of frequent follow-up visits to  maximize her success with intensive lifestyle modifications for her multiple health conditions.  ? ?Objective:  ? ?Blood pressure 110/68, pulse 68, temperature 97.6 ?F (36.4 ?C), height 5' 7"  (1.702 m), weight 231

## 2021-07-19 ENCOUNTER — Encounter: Payer: Self-pay | Admitting: Gastroenterology

## 2021-07-20 ENCOUNTER — Encounter: Payer: Self-pay | Admitting: Family Medicine

## 2021-07-21 ENCOUNTER — Encounter (INDEPENDENT_AMBULATORY_CARE_PROVIDER_SITE_OTHER): Payer: Self-pay | Admitting: Nurse Practitioner

## 2021-07-21 ENCOUNTER — Ambulatory Visit (INDEPENDENT_AMBULATORY_CARE_PROVIDER_SITE_OTHER): Payer: Commercial Managed Care - PPO | Admitting: Nurse Practitioner

## 2021-07-21 VITALS — BP 99/60 | HR 73 | Temp 97.8°F | Ht 67.0 in | Wt 226.0 lb

## 2021-07-21 DIAGNOSIS — Z6835 Body mass index (BMI) 35.0-35.9, adult: Secondary | ICD-10-CM | POA: Diagnosis not present

## 2021-07-21 DIAGNOSIS — E1169 Type 2 diabetes mellitus with other specified complication: Secondary | ICD-10-CM | POA: Diagnosis not present

## 2021-07-21 DIAGNOSIS — E559 Vitamin D deficiency, unspecified: Secondary | ICD-10-CM | POA: Diagnosis not present

## 2021-07-21 DIAGNOSIS — E669 Obesity, unspecified: Secondary | ICD-10-CM | POA: Diagnosis not present

## 2021-07-21 DIAGNOSIS — Z9189 Other specified personal risk factors, not elsewhere classified: Secondary | ICD-10-CM

## 2021-07-21 DIAGNOSIS — Z794 Long term (current) use of insulin: Secondary | ICD-10-CM

## 2021-07-22 ENCOUNTER — Encounter: Payer: Self-pay | Admitting: Family Medicine

## 2021-07-22 ENCOUNTER — Encounter (HOSPITAL_COMMUNITY): Payer: Self-pay

## 2021-07-22 ENCOUNTER — Ambulatory Visit (HOSPITAL_COMMUNITY)
Admission: RE | Admit: 2021-07-22 | Discharge: 2021-07-22 | Disposition: A | Discharge status: Home / Self Care | Payer: Commercial Managed Care - PPO | Source: Ambulatory Visit | Attending: Gastroenterology | Admitting: Gastroenterology

## 2021-07-22 ENCOUNTER — Ambulatory Visit (INDEPENDENT_AMBULATORY_CARE_PROVIDER_SITE_OTHER): Payer: Commercial Managed Care - PPO | Admitting: Family Medicine

## 2021-07-22 VITALS — BP 110/68 | HR 76 | Temp 98.4°F | Wt 229.0 lb

## 2021-07-22 DIAGNOSIS — R109 Unspecified abdominal pain: Secondary | ICD-10-CM | POA: Diagnosis present

## 2021-07-22 DIAGNOSIS — L0292 Furuncle, unspecified: Secondary | ICD-10-CM | POA: Diagnosis not present

## 2021-07-22 DIAGNOSIS — R1031 Right lower quadrant pain: Secondary | ICD-10-CM | POA: Diagnosis present

## 2021-07-22 DIAGNOSIS — R1032 Left lower quadrant pain: Secondary | ICD-10-CM | POA: Insufficient documentation

## 2021-07-22 DIAGNOSIS — R1030 Lower abdominal pain, unspecified: Secondary | ICD-10-CM | POA: Insufficient documentation

## 2021-07-22 LAB — POCT I-STAT CREATININE: Creatinine, Ser: 0.5 mg/dL (ref 0.44–1.00)

## 2021-07-22 MED ORDER — IOHEXOL 300 MG/ML  SOLN
100.0000 mL | Freq: Once | INTRAMUSCULAR | Status: AC | PRN
  Administered 2021-07-22: 100 mL via INTRAVENOUS

## 2021-07-22 MED ORDER — DOXYCYCLINE HYCLATE 100 MG PO CAPS: 100.0000 mg | ORAL_CAPSULE | Freq: Two times a day (BID) | ORAL | 0 refills | Status: DC

## 2021-07-22 MED ORDER — SODIUM CHLORIDE (PF) 0.9 % IJ SOLN
INTRAMUSCULAR | Status: AC
  Filled 2021-07-22: qty 50

## 2021-07-22 NOTE — Progress Notes (Signed)
? ?  Subjective:  ? ? Patient ID: Laurie Sutton, female    DOB: Nov 14, 1980, 41 y.o.   MRN: [id number] ? ?HPI ?Here for one week of a tender boil in the right armpit. No fever. She gets these periodically.  ? ? ?Review of Systems  ?Constitutional: Negative.   ?Respiratory: Negative.    ?Cardiovascular: Negative.   ? ?   ?Objective:  ? Physical Exam ?Constitutional:   ?   Appearance: Normal appearance.  ?Cardiovascular:  ?   Rate and Rhythm: Normal rate and regular rhythm.  ?   Heart sounds: Normal heart sounds.  ?Pulmonary:  ?   Effort: Pulmonary effort is normal.  ?   Breath sounds: Normal breath sounds.  ?Skin: ?   Comments: There is a small tender boil in the right axilla   ?Neurological:  ?   Mental Status: She is alert.  ? ? ? ? ? ?   ?Assessment & Plan:  ?Boil, treat with 10 days of Doxycycline.  ?Alysia Penna, MD ? ? ?

## 2021-07-27 NOTE — Progress Notes (Signed)
? ? ? ?Chief Complaint:  ? ?OBESITY ?Laurie Sutton is here to discuss her progress with her obesity treatment plan along with follow-up of her obesity related diagnoses. Laurie Sutton is on the Category 3 Plan and states she is following her eating plan approximately 50% of the time. Laurie Sutton states she is walking for 30 minutes 5 times per week. ? ?Today's visit was #: 3 ?Starting weight: 230 lbs ?Starting date: 06/09/2021 ?Today's weight: 226 lbs ?Today's date: 07/21/2021 ?Total lbs lost to date: 4 lbs ?Total lbs lost since last in-office visit: 5 lbs ? ?Interim History: Laurie Sutton has done well with weight loss since her last visit. She is eating 2 meals per day. She is focusing on protein. She struggles with hunger between 1:30-5:30 pm. She eats breakfast 7-7:30 protein shake or toast with peanut butter. Lunch at 10:30 [place] sandwich, pickles, apple, 100 calories cookies and Dinner between 5-6 pm protein limited vegetables. She states she is a very picky eater. She struggles to eat due to work schedule.  ? ?Subjective:  ? ?1. Type 2 diabetes mellitus with other specified complication, with long-term current use of insulin (Laurie Sutton) ?Laurie Sutton's last A1C was 8.7 on 06/22/2021. She hasn't started taking Victoza yet. She is waiting for PA. Her fasting blood sugar was in the range of 215-240. Her lantus was increased to 160 units and Humalog 80-90 units on SS. She took Trulicity in the past. She stopped Metformin 1-2 months ago.  ? ?2. Vitamin D deficiency ?Laurie Sutton is taking Vitamin D 50,000 units weekly. She denies side effects. She denies nausea, vomiting and muscle weakness.  ? ?3. At risk for heart disease ?Laurie Sutton is at higher than average risk for cardiovascular disease due to obesity.  ? ?Assessment/Plan:  ? ?1. Type 2 diabetes mellitus with other specified complication, with long-term current use of insulin (Laurie Sutton) ?Laurie Sutton will continue to follow up with Endocrinologist. She will continue medications as directed. Good blood sugar control  is important to decrease the likelihood of diabetic complications such as nephropathy, neuropathy, limb loss, blindness, coronary artery disease, and death. Intensive lifestyle modification including diet, exercise and weight loss are the first line of treatment for diabetes.  ? ?2. Vitamin D deficiency ?Low Vitamin D level contributes to fatigue and are associated with obesity, breast, and colon cancer. Laurie Sutton agrees to continue to take prescription Vitamin D 50,000 IU every week and she will follow-up for routine testing of Vitamin D, at least 2-3 times per year to avoid over-replacement. She denies side effects nausea, vomiting and muscle weakness.  ? ?3. At risk for heart disease ?Laurie Sutton was given approximately 15 minutes of coronary artery disease prevention counseling today. She is 41 y.o. female and has risk factors for heart disease including obesity. We discussed intensive lifestyle modifications today with an emphasis on specific weight loss instructions and strategies. ? ?Repetitive spaced learning was employed today to elicit superior memory formation and behavioral change.   ? ?4. Obesity with current BMI of 35.4 ?Laurie Sutton is currently in the action stage of change. As such, her goal is to continue with weight loss efforts. She has agreed to the Category 3 Plan.  ? ?Laurie Sutton has a protein shake for breakfast. She eats 10:30, 1:30, and 6 pm. Handouts: Protein content of food was given.  ? ?Exercise goals:  As is.  ? ?Behavioral modification strategies: increasing lean protein intake, increasing water intake, and no skipping meals. ? ?Laurie Sutton has agreed to follow-up with our clinic in 2 weeks. She was informed  of the importance of frequent follow-up visits to maximize her success with intensive lifestyle modifications for her multiple health conditions.  ? ?Objective:  ? ?Blood pressure 99/60, pulse 73, temperature 97.8 ?F (36.6 ?C), height 5' 7"  (1.702 m), weight 226 lb (102.5 kg), last menstrual period  11/13/2020, SpO2 97 %. ?Body mass index is 35.4 kg/m?. ? ?General: Cooperative, alert, well developed, in no acute distress. ?HEENT: Conjunctivae and lids unremarkable. ?Cardiovascular: Regular rhythm.  ?Lungs: Normal work of breathing. ?Neurologic: No focal deficits.  ? ?Lab Results  ?Component Value Date  ? CREATININE 0.50 07/22/2021  ? BUN <5 (L) 06/08/2021  ? NA 135 06/08/2021  ? K 3.5 06/08/2021  ? CL 102 06/08/2021  ? CO2 23 06/08/2021  ? ?Lab Results  ?Component Value Date  ? ALT 39 06/08/2021  ? AST 55 (H) 06/08/2021  ? ALKPHOS 107 06/08/2021  ? BILITOT 0.8 06/08/2021  ? ?Lab Results  ?Component Value Date  ? HGBA1C 9.6 04/27/2019  ? HGBA1C 6.2 08/19/2017  ? HGBA1C 6.9 (H) 06/07/2016  ? HGBA1C 7.5 (H) 12/30/2015  ? HGBA1C 6.0 08/21/2015  ? ?No results found for: INSULIN ?Lab Results  ?Component Value Date  ? TSH 1.170 06/09/2021  ? ?Lab Results  ?Component Value Date  ? CHOL 169 08/19/2017  ? HDL 38 08/19/2017  ? [place] 91 08/19/2017  ? TRIG 202 (A) 08/19/2017  ? CHOLHDL 4 05/12/2015  ? ?Lab Results  ?Component Value Date  ? VD25OH 12.8 (L) 06/09/2021  ? ?Lab Results  ?Component Value Date  ? WBC 3.6 (L) 06/08/2021  ? HGB 13.6 06/08/2021  ? HCT 40.0 06/08/2021  ? MCV 84.9 06/08/2021  ? PLT 91 (L) 06/08/2021  ? ?No results found for: IRON, TIBC, FERRITIN ? ?Attestation Statements:  ? ?Reviewed by clinician on day of visit: allergies, medications, problem list, medical history, surgical history, family history, social history, and previous encounter notes. ? ?I, Lizbeth Bark, RMA, am acting as Location manager for Everardo Pacific, FNP.  ? ?I have reviewed the above documentation for accuracy and completeness, and I agree with the above. Everardo Pacific, FNP  ?

## 2021-07-30 ENCOUNTER — Ambulatory Visit: Payer: Commercial Managed Care - PPO

## 2021-07-30 DIAGNOSIS — M722 Plantar fascial fibromatosis: Secondary | ICD-10-CM

## 2021-07-30 DIAGNOSIS — E119 Type 2 diabetes mellitus without complications: Secondary | ICD-10-CM

## 2021-07-30 NOTE — Progress Notes (Signed)
SITUATION: ?Reason for Visit: Fitting and Delivery of Custom Fabricated Foot Orthoses ?Patient Report: Patient reports comfort and is satisfied with device. ? ?OBJECTIVE DATA: ?Patient History / Diagnosis:   ?  ICD-10-CM   ?1. Plantar fasciitis, bilateral  M72.2   ?  ?2. Type 2 diabetes mellitus treated with insulin (HCC)  E11.9   ? Z79.4   ?  ? ? ?Provided Device:  Custom Functional Foot Orthotics ?    [facility]: [id number] ? ?GOAL OF ORTHOSIS ?- Improve gait ?- Decrease energy expenditure ?- Improve Balance ?- Provide Triplanar stability of foot complex ?- Facilitate motion ? ?ACTIONS PERFORMED ?Patient was fit with foot orthotics trimmed to shoe last. Patient tolerated fittign procedure.  ? ?Patient was provided with verbal and written instruction and demonstration regarding donning, doffing, wear, care, proper fit, function, purpose, cleaning, and use of the orthosis and in all related precautions and risks and benefits regarding the orthosis. ? ?Patient was also provided with verbal instruction regarding how to report any failures or malfunctions of the orthosis and necessary follow up care. Patient was also instructed to contact our office regarding any change in status that may affect the function of the orthosis. ? ?Patient demonstrated independence with proper donning, doffing, and fit and verbalized understanding of all instructions. ? ?PLAN: ?Patient is to follow up in one week or as necessary (PRN). All questions were answered and concerns addressed. Plan of care was discussed with and agreed upon by the patient. ? ?

## 2021-08-06 ENCOUNTER — Encounter (INDEPENDENT_AMBULATORY_CARE_PROVIDER_SITE_OTHER): Payer: Self-pay | Admitting: Family Medicine

## 2021-08-06 ENCOUNTER — Ambulatory Visit (INDEPENDENT_AMBULATORY_CARE_PROVIDER_SITE_OTHER): Payer: Commercial Managed Care - PPO | Admitting: Family Medicine

## 2021-08-06 VITALS — BP 96/61 | HR 65 | Temp 97.6°F | Ht 67.0 in | Wt 230.0 lb

## 2021-08-06 DIAGNOSIS — Z794 Long term (current) use of insulin: Secondary | ICD-10-CM

## 2021-08-06 DIAGNOSIS — F39 Unspecified mood [affective] disorder: Secondary | ICD-10-CM

## 2021-08-06 DIAGNOSIS — E559 Vitamin D deficiency, unspecified: Secondary | ICD-10-CM

## 2021-08-06 DIAGNOSIS — Z9189 Other specified personal risk factors, not elsewhere classified: Secondary | ICD-10-CM

## 2021-08-06 DIAGNOSIS — E669 Obesity, unspecified: Secondary | ICD-10-CM

## 2021-08-06 DIAGNOSIS — Z6836 Body mass index (BMI) 36.0-36.9, adult: Secondary | ICD-10-CM

## 2021-08-06 DIAGNOSIS — E1169 Type 2 diabetes mellitus with other specified complication: Secondary | ICD-10-CM | POA: Diagnosis not present

## 2021-08-20 ENCOUNTER — Encounter (INDEPENDENT_AMBULATORY_CARE_PROVIDER_SITE_OTHER): Payer: Self-pay | Admitting: Nurse Practitioner

## 2021-08-20 ENCOUNTER — Ambulatory Visit (INDEPENDENT_AMBULATORY_CARE_PROVIDER_SITE_OTHER): Payer: Commercial Managed Care - PPO | Admitting: Nurse Practitioner

## 2021-08-20 VITALS — BP 97/58 | HR 75 | Temp 97.9°F | Ht 67.0 in | Wt 230.0 lb

## 2021-08-20 DIAGNOSIS — Z6836 Body mass index (BMI) 36.0-36.9, adult: Secondary | ICD-10-CM | POA: Diagnosis not present

## 2021-08-20 DIAGNOSIS — E669 Obesity, unspecified: Secondary | ICD-10-CM

## 2021-08-20 DIAGNOSIS — Z794 Long term (current) use of insulin: Secondary | ICD-10-CM

## 2021-08-20 DIAGNOSIS — E1169 Type 2 diabetes mellitus with other specified complication: Secondary | ICD-10-CM

## 2021-08-20 DIAGNOSIS — E559 Vitamin D deficiency, unspecified: Secondary | ICD-10-CM | POA: Diagnosis not present

## 2021-08-20 MED ORDER — VITAMIN D (ERGOCALCIFEROL) 1.25 MG (50000 UNIT) PO CAPS: 50000.0000 [IU] | ORAL_CAPSULE | ORAL | 0 refills | Status: DC

## 2021-08-24 ENCOUNTER — Telehealth (INDEPENDENT_AMBULATORY_CARE_PROVIDER_SITE_OTHER): Payer: Self-pay

## 2021-08-24 DIAGNOSIS — E559 Vitamin D deficiency, unspecified: Secondary | ICD-10-CM | POA: Insufficient documentation

## 2021-08-24 NOTE — Progress Notes (Signed)
? ? ? ?Chief Complaint:  ? ?OBESITY ?Laurie Sutton is here to discuss her progress with her obesity treatment plan along with follow-up of her obesity related diagnoses. [name] is on the Category 3 Plan and states she is following her eating plan approximately 5-10% of the time. [name] states she is walking 30 minutes 5 times per week. ? ?Today's visit was #: 5 ?Starting weight: 230 lbs ?Starting date: 0228/2023 ?Today's weight: 230 lbs ?Today's date: 08/20/2021 ?Total lbs lost to date: 0 ?Total lbs lost since last in-office visit: 0 ? ?Interim History: Laurie Sutton has been struggling with side effects of nausea since starting/stopping Victoza, so she has not been able to follow her meal plan.  She stopped Victoza one week ago and started feeling better over the past 4 days.  Today started back following the meal plan.  Laurie Sutton has also missed two days of work due to nausea.  ? ?Subjective:  ? ?1. Type 2 diabetes mellitus with other specified complication, with long-term current use of insulin (Laurie Sutton) ?Laurie Sutton's last A1c was 8.7 on 06/22/2021.  She is taking Lantus 120 units and Humalog 80-90 units on sliding scale.  She started Victoza since her last visit with me.  Once she increased to 1.2 mg of Victoza she had side effects including nausea and headache.  She took Victoza for 3 weeks and stopped, she then restarted Metformin XR 500 mg 2 tablets daily. Denies side effects.   Laurie Sutton's fasting blood sugars range from 120-180.  Laurie Sutton denies hypoglycemia.  Laurie Sutton reached out to Endocrinology today asking if she should restart Victoza 0.6 mg or try Ozempic.  She has taken Ozempic in the past and is unsure of why she stopped taking it.  She has tried Trulicity, but stopped due to GI side effects.  She has also taken Januvia and stopped taking it due to side effects. ? ?2. Vitamin D deficiency ?Laurie Sutton's last Vitamin D level was 12.8. She is taking prescription Vitamin D 50,000 IU.  Denies any side effects: Nausea, vomiting or muscle  weakness.  ? ? ?Assessment/Plan:  ? ?1. Type 2 diabetes mellitus with other specified complication, with long-term current use of insulin ([place]) ?Laurie Sutton has agreed to discuss with Endocrinology, until then take medication as directed.  ? ?Laurie Sutton was given approximately 15 minutes of diabetic education and counseling today. We discussed intensive lifestyle modifications today with an emphasis on weight loss as well as increasing exercise and decreasing simple carbohydrates in her diet. We also reviewed medication options with an emphasis on risk versus benefits of those discussed. ? ?Repetitive spaced learning was employed today to elicit superior memory formation and behavioral change.   ? ?2. Vitamin D deficiency ?Laurie Sutton has agreed to continue prescription Vitamin D, 50,000 IU.  See below.  Side effects have been discussed.  ? ?- Vitamin D, Ergocalciferol, (DRISDOL) 1.25 MG (50000 UNIT) CAPS capsule; Take 1 capsule (50,000 Units total) by mouth every 7 (seven) days.  Dispense: 4 capsule; Refill: 0 ? ?3. Obesity with current BMI of 36.1 ?Laurie Sutton is currently in the action stage of change. As such, her goal is to continue with weight loss efforts. She has agreed to the Category 3 Plan.  ? ?Exercise goals:  As is.  ? ?Behavioral modification strategies: increasing lean protein intake, increasing water intake, no skipping meals, and meal planning and cooking strategies. ? ?Laurie Sutton has agreed to follow-up with our clinic in 2 weeks. She was informed of the importance of frequent follow-up visits to maximize her  success with intensive lifestyle modifications for her multiple health conditions.  ? ?Objective:  ? ?Blood pressure (!) 97/58, pulse 75, temperature 97.9 ?F (36.6 ?C), height 5' 7"  (1.702 m), weight 230 lb (104.3 kg), last menstrual period 11/13/2020, SpO2 97 %. ?Body mass index is 36.02 kg/m?. ? ?General: Cooperative, alert, well developed, in no acute distress. ?HEENT: Conjunctivae and lids  unremarkable. ?Cardiovascular: Regular rhythm.  ?Lungs: Normal work of breathing. ?Neurologic: No focal deficits.  ? ?Lab Results  ?Component Value Date  ? CREATININE 0.50 07/22/2021  ? BUN <5 (L) 06/08/2021  ? NA 135 06/08/2021  ? K 3.5 06/08/2021  ? CL 102 06/08/2021  ? CO2 23 06/08/2021  ? ?Lab Results  ?Component Value Date  ? ALT 39 06/08/2021  ? AST 55 (H) 06/08/2021  ? ALKPHOS 107 06/08/2021  ? BILITOT 0.8 06/08/2021  ? ?Lab Results  ?Component Value Date  ? HGBA1C 9.6 04/27/2019  ? HGBA1C 6.2 08/19/2017  ? HGBA1C 6.9 (H) 06/07/2016  ? HGBA1C 7.5 (H) 12/30/2015  ? HGBA1C 6.0 08/21/2015  ? ?No results found for: INSULIN ?Lab Results  ?Component Value Date  ? TSH 1.170 06/09/2021  ? ?Lab Results  ?Component Value Date  ? CHOL 169 08/19/2017  ? HDL 38 08/19/2017  ? [place] 91 08/19/2017  ? TRIG 202 (A) 08/19/2017  ? CHOLHDL 4 05/12/2015  ? ?Lab Results  ?Component Value Date  ? VD25OH 12.8 (L) 06/09/2021  ? ?Lab Results  ?Component Value Date  ? WBC 3.6 (L) 06/08/2021  ? HGB 13.6 06/08/2021  ? HCT 40.0 06/08/2021  ? MCV 84.9 06/08/2021  ? PLT 91 (L) 06/08/2021  ? ?No results found for: IRON, TIBC, FERRITIN ? ?Attestation Statements:  ? ?Reviewed by clinician on day of visit: allergies, medications, problem list, medical history, surgical history, family history, social history, and previous encounter notes. ? ?Time spent on visit including pre-visit chart review and post-visit care and charting was 30 minutes.  ? ?I, Davy Pique, RMA, am acting as Location manager for [employer], FNP-C. ? ?I have reviewed the above documentation for accuracy and completeness, and I agree with the above. Everardo Pacific, FNP  ?

## 2021-08-24 NOTE — Telephone Encounter (Signed)
Pt canceled appt Dr. Mallie Mussel. Referred by [facility].  Pt declined referral.

## 2021-08-25 NOTE — Progress Notes (Addendum)
? ? ? ?Chief Complaint:  ? ?OBESITY ?Laurie Sutton is here to discuss her progress with her obesity treatment plan along with follow-up of her obesity related diagnoses. Laurie Sutton is on the Category 3 Plan and states she is following her eating plan approximately 50% of the time. Laurie Sutton states she is walking for 30-40 minutes 5 times per week. ? ?Today's visit was #: 4 ?Starting weight: 230 lbs ?Starting date: 06/09/2021 ?Today's weight: 230 lbs ?Today's date: 08/06/2021 ?Total lbs lost to date: 0 ?Total lbs lost since last in-office visit: 0 ? ?Interim History: Increased emotional eating lately. Hasn't been meal prepping and she has been on the go a lot. Daughter has been struggling emotionally lately. ? ?Subjective:  ? ?1. Type 2 diabetes mellitus with other specified complication, with long-term current use of insulin ([place]) ?Last A1c was 8.2 on 03/23/2021. [name] gave her Victoza a couple of weeks ago and metformin again; SS insulin and Lantus 120 units qhs.  ? ?2. Vitamin D deficiency ?She is currently taking prescription vitamin D 50,000 IU each week. She denies nausea, vomiting or muscle weakness. ? ?3. Mood disorder (HCC)with emotional eating ?No personal counselor. PCP manages mood with Lamictal, Prozac, and Xanax. Per patient, mood is under good control but she has been doing more emotional eating lately. ? ?4. At risk for activity intolerance ?Laurie Sutton is at risk of exercise intolerance due to obesity. ? ?Assessment/Plan:  ?No orders of the defined types were placed in this encounter. ? ? ?Medications Discontinued During This Encounter  ?Medication Reason  ? doxycycline (VIBRAMYCIN) 100 MG capsule Completed Course  ?  ? ?No orders of the defined types were placed in this encounter. ?  ? ?1. Type 2 diabetes mellitus with other specified complication, with long-term current use of insulin ([place]) ?Continue medication management per Endo. Happy to see GLP-1 and metformin started. Follow PNP and lose weight. ? ?2. Vitamin  D deficiency ?We will refill prescription Vitamin D 50,000 IU every week #4 for 1 month. Laurie Sutton will follow-up for routine testing of Vitamin D, at least 2-3 times per year to avoid over-replacement. ? ?3. Mood disorder (HCC)with emotional eating ?Dr. Mallie Mussel referral. Start walking with her daughter for her anxiety management as well as her own. ? ?4. At risk for activity intolerance ?Laurie Sutton was given approximately 9 minutes of exercise intolerance counseling today. She is 41 y.o. female and has risk factors exercise intolerance including obesity. We discussed intensive lifestyle modifications today with an emphasis on specific weight loss instructions and strategies. Laurie Sutton will slowly increase activity as tolerated. ? ?Repetitive spaced learning was employed today to elicit superior memory formation and behavioral change. ? ?5. Obesity with current BMI of 36.1 ?Laurie Sutton is currently in the action stage of change. As such, her goal is to continue with weight loss efforts. She has agreed to the Category 3 Plan.  ? ?Dr. Mallie Mussel referral, start walking 2 days per week. Strategies to not skip dinner meal was discussed with the patient. ? ?Exercise goals: All adults should avoid inactivity. Some physical activity is better than none, and adults who participate in any amount of physical activity gain some health benefits. ? ?Behavioral modification strategies: increasing lean protein intake, decreasing simple carbohydrates, and no skipping meals. ? ?Laurie Sutton has agreed to follow-up with our clinic in 3 weeks with Elayne Snare, NP. She was informed of the importance of frequent follow-up visits to maximize her success with intensive lifestyle modifications for her multiple health conditions.  ? ?Objective:  ? ?  Blood pressure 96/61, pulse 65, temperature 97.6 ?F (36.4 ?C), height 5' 7"  (1.702 m), weight 230 lb (104.3 kg), last menstrual period 11/13/2020, SpO2 96 %. ?Body mass index is 36.02 kg/m?. ? ?General:  Cooperative, alert, well developed, in no acute distress. ?HEENT: Conjunctivae and lids unremarkable. ?Cardiovascular: Regular rhythm.  ?Lungs: Normal work of breathing. ?Neurologic: No focal deficits.  ? ?Lab Results  ?Component Value Date  ? CREATININE 0.50 07/22/2021  ? BUN <5 (L) 06/08/2021  ? NA 135 06/08/2021  ? K 3.5 06/08/2021  ? CL 102 06/08/2021  ? CO2 23 06/08/2021  ? ?Lab Results  ?Component Value Date  ? ALT 39 06/08/2021  ? AST 55 (H) 06/08/2021  ? ALKPHOS 107 06/08/2021  ? BILITOT 0.8 06/08/2021  ? ?Lab Results  ?Component Value Date  ? HGBA1C 9.6 04/27/2019  ? HGBA1C 6.2 08/19/2017  ? HGBA1C 6.9 (H) 06/07/2016  ? HGBA1C 7.5 (H) 12/30/2015  ? HGBA1C 6.0 08/21/2015  ? ?No results found for: INSULIN ?Lab Results  ?Component Value Date  ? TSH 1.170 06/09/2021  ? ?Lab Results  ?Component Value Date  ? CHOL 169 08/19/2017  ? HDL 38 08/19/2017  ? [place] 91 08/19/2017  ? TRIG 202 (A) 08/19/2017  ? CHOLHDL 4 05/12/2015  ? ?Lab Results  ?Component Value Date  ? VD25OH 12.8 (L) 06/09/2021  ? ?Lab Results  ?Component Value Date  ? WBC 3.6 (L) 06/08/2021  ? HGB 13.6 06/08/2021  ? HCT 40.0 06/08/2021  ? MCV 84.9 06/08/2021  ? PLT 91 (L) 06/08/2021  ? ?No results found for: IRON, TIBC, FERRITIN ? ?Attestation Statements:  ? ?Reviewed by clinician on day of visit: allergies, medications, problem list, medical history, surgical history, family history, social history, and previous encounter notes. ? ? ?I, Trixie Dredge, am acting as transcriptionist for [employer], DO. ? ?I have reviewed the above documentation for accuracy and completeness, and I agree with the above. Marjory Sneddon, D.O. ? ?The [place] was signed into law in 2016 which includes the topic of electronic health records.  This provides immediate access to information in MyChart.  This includes consultation notes, operative notes, office notes, lab results and pathology reports.  If you have any questions about what you read  please let us know at your next visit so we can discuss your concerns and take corrective action if need be.  We are right here with you. ? ? ?

## 2021-09-01 ENCOUNTER — Other Ambulatory Visit: Payer: Self-pay | Admitting: Neurology

## 2021-09-01 ENCOUNTER — Other Ambulatory Visit: Payer: Self-pay | Admitting: Family Medicine

## 2021-09-01 NOTE — Telephone Encounter (Signed)
Rx refilled.

## 2021-09-02 ENCOUNTER — Ambulatory Visit: Payer: Commercial Managed Care - PPO | Admitting: Podiatry

## 2021-09-03 ENCOUNTER — Encounter (INDEPENDENT_AMBULATORY_CARE_PROVIDER_SITE_OTHER): Payer: Self-pay | Admitting: Nurse Practitioner

## 2021-09-03 ENCOUNTER — Ambulatory Visit (INDEPENDENT_AMBULATORY_CARE_PROVIDER_SITE_OTHER): Payer: Commercial Managed Care - PPO | Admitting: Nurse Practitioner

## 2021-09-03 VITALS — BP 102/64 | HR 81 | Temp 98.0°F | Ht 67.0 in | Wt 228.0 lb

## 2021-09-03 DIAGNOSIS — E1169 Type 2 diabetes mellitus with other specified complication: Secondary | ICD-10-CM | POA: Diagnosis not present

## 2021-09-03 DIAGNOSIS — Z6835 Body mass index (BMI) 35.0-35.9, adult: Secondary | ICD-10-CM

## 2021-09-03 DIAGNOSIS — E669 Obesity, unspecified: Secondary | ICD-10-CM

## 2021-09-03 DIAGNOSIS — Z794 Long term (current) use of insulin: Secondary | ICD-10-CM | POA: Diagnosis not present

## 2021-09-09 ENCOUNTER — Other Ambulatory Visit: Payer: Self-pay | Admitting: *Deleted

## 2021-09-09 MED ORDER — NORTRIPTYLINE HCL 10 MG PO CAPS: 30.0000 mg | ORAL_CAPSULE | Freq: Every day | ORAL | 0 refills | Status: DC

## 2021-09-10 NOTE — Progress Notes (Signed)
Chief Complaint:   OBESITY Laurie Sutton is here to discuss her progress with her obesity treatment plan along with follow-up of her obesity related diagnoses. Laurie Sutton is on the Category 3 Plan and states she is following her eating plan approximately 25% of the time. Laurie Sutton states she is doing 0 minutes 0 times per week.  Today's visit was #: 6 Starting weight: 230 lbs Starting date: 06/09/2021 Today's weight: 228 lbs Today's date: 09/03/2021 Total lbs lost to date: 2 lbs Total lbs lost since last in-office visit: 2 lbs  Interim History: Laurie Sutton has done well with weight loss since her last visit. She is currently under a lot stress. Her mother is having surgery tomorrow. She has had a lot of functions at her daughter's school. She will have to help care for her father while her mother recovers. She is trying not to stress eat. She tends to eat when bored.    Subjective:   1. Type 2 diabetes mellitus with other specified complication, with long-term current use of insulin ([place]) Laurie Sutton started Ozempic 0.25 mg this week. She notes some nausea yesterday. She is still taking Lantus Solostar and Humalog. Her fasting blood sugar range 130-215. She denies hypoglycemia. Ozempic was prescribed by [name].   Assessment/Plan:   1. Type 2 diabetes mellitus with other specified complication, with long-term current use of insulin (HCC) Laurie Sutton will continue to follow up [name] on June 19th. She will continue medications as directed. Good blood sugar control is important to decrease the likelihood of diabetic complications such as nephropathy, neuropathy, limb loss, blindness, coronary artery disease, and death. Intensive lifestyle modification including diet, exercise and weight loss are the first line of treatment for diabetes.   2. Obesity with current BMI of 35.8 Laurie Sutton is currently in the action stage of change. As such, her goal is to continue with weight loss efforts. She has agreed to the  Category 3 Plan.   Exercise goals:  Laurie Sutton will start walking.   Behavioral modification strategies: increasing lean protein intake, increasing water intake, and planning for success.  Laurie Sutton has agreed to follow-up with our clinic in 3 weeks. She was informed of the importance of frequent follow-up visits to maximize her success with intensive lifestyle modifications for her multiple health conditions.   Objective:   Blood pressure 102/64, pulse 81, temperature 98 F (36.7 C), height 5' 7"  (1.702 m), weight 228 lb (103.4 kg), last menstrual period 11/13/2020, SpO2 98 %. Body mass index is 35.71 kg/m.  General: Cooperative, alert, well developed, in no acute distress. HEENT: Conjunctivae and lids unremarkable. Cardiovascular: Regular rhythm.  Lungs: Normal work of breathing. Neurologic: No focal deficits.   Lab Results  Component Value Date   CREATININE 0.50 07/22/2021   BUN <5 (L) 06/08/2021   NA 135 06/08/2021   K 3.5 06/08/2021   CL 102 06/08/2021   CO2 23 06/08/2021   Lab Results  Component Value Date   ALT 39 06/08/2021   AST 55 (H) 06/08/2021   ALKPHOS 107 06/08/2021   BILITOT 0.8 06/08/2021   Lab Results  Component Value Date   HGBA1C 9.6 04/27/2019   HGBA1C 6.2 08/19/2017   HGBA1C 6.9 (H) 06/07/2016   HGBA1C 7.5 (H) 12/30/2015   HGBA1C 6.0 08/21/2015   No results found for: INSULIN Lab Results  Component Value Date   TSH 1.170 06/09/2021   Lab Results  Component Value Date   CHOL 169 08/19/2017   HDL 38 08/19/2017  LDLCALC 91 08/19/2017   TRIG 202 (A) 08/19/2017   CHOLHDL 4 05/12/2015   Lab Results  Component Value Date   VD25OH 12.8 (L) 06/09/2021   Lab Results  Component Value Date   WBC 3.6 (L) 06/08/2021   HGB 13.6 06/08/2021   HCT 40.0 06/08/2021   MCV 84.9 06/08/2021   PLT 91 (L) 06/08/2021   No results found for: IRON, TIBC, FERRITIN  Attestation Statements:   Reviewed by clinician on day of visit: allergies, medications,  problem list, medical history, surgical history, family history, social history, and previous encounter notes.  Time spent on visit including pre-visit chart review and post-visit care and charting was 30 minutes.   I, Lizbeth Bark, RMA, am acting as Location manager for Everardo Pacific, FNP.   I have reviewed the above documentation for accuracy and completeness, and I agree with the above. Everardo Pacific, FNP

## 2021-09-23 ENCOUNTER — Ambulatory Visit: Payer: Commercial Managed Care - PPO | Admitting: Podiatry

## 2021-09-29 ENCOUNTER — Encounter (INDEPENDENT_AMBULATORY_CARE_PROVIDER_SITE_OTHER): Payer: Self-pay | Admitting: Nurse Practitioner

## 2021-09-29 ENCOUNTER — Ambulatory Visit (INDEPENDENT_AMBULATORY_CARE_PROVIDER_SITE_OTHER): Payer: Commercial Managed Care - PPO | Admitting: Nurse Practitioner

## 2021-09-29 VITALS — BP 100/64 | HR 84 | Temp 97.9°F | Ht 67.0 in | Wt 226.0 lb

## 2021-09-29 DIAGNOSIS — Z794 Long term (current) use of insulin: Secondary | ICD-10-CM

## 2021-09-29 DIAGNOSIS — E559 Vitamin D deficiency, unspecified: Secondary | ICD-10-CM | POA: Diagnosis not present

## 2021-09-29 DIAGNOSIS — Z7985 Long-term (current) use of injectable non-insulin antidiabetic drugs: Secondary | ICD-10-CM

## 2021-09-29 DIAGNOSIS — E1169 Type 2 diabetes mellitus with other specified complication: Secondary | ICD-10-CM

## 2021-09-29 DIAGNOSIS — E669 Obesity, unspecified: Secondary | ICD-10-CM

## 2021-09-29 DIAGNOSIS — R110 Nausea: Secondary | ICD-10-CM | POA: Diagnosis not present

## 2021-09-29 DIAGNOSIS — Z6835 Body mass index (BMI) 35.0-35.9, adult: Secondary | ICD-10-CM

## 2021-09-29 MED ORDER — VITAMIN D (ERGOCALCIFEROL) 1.25 MG (50000 UNIT) PO CAPS: 50000.0000 [IU] | ORAL_CAPSULE | ORAL | 0 refills | Status: DC

## 2021-09-29 MED ORDER — ONDANSETRON 4 MG PO TBDP: 4.0000 mg | ORAL_TABLET | Freq: Three times a day (TID) | ORAL | 0 refills | Status: DC | PRN

## 2021-09-29 NOTE — Progress Notes (Unsigned)
PATIENT: Laurie Sutton DOB: 1981-02-28  REASON FOR VISIT: follow up HISTORY FROM: patient  HISTORY OF PRESENT ILLNESS: Today 09/29/21  Laurie Sutton   Update 09/30/20 SS: Laurie Sutton is a 41 year old female with history of migraine headaches.  She is on nortriptyline and propanolol.  Takes Maxalt. Reports 2-3 migraines a week. Reports Maxalt makes her stomach hurt. Will take Excedrin migraine, doesn't take often, works well. For a few days she missed propranolol, headaches much worse. Having hysterectomy in August. Right foot surgery this week for plantar fascitis. 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 Here today unaccompanied. Started nutrition and diet program, exercise, lost 19 lbs.   Update 07/11/2019 SS: Laurie Sutton is a 41 year old female with history of migraine headaches.  She remains on nortriptyline and propanolol.  She takes Maxalt if needed.  Her headaches have increased as of lately due to poorly controlled diabetes, stress, and weather change.  She says she is having 2-3 migraines a month, 3-4 headaches a month, overall about 7-8 headache days a month.  About a month ago, says she had a headache that was behind her right eye, went to the eye doctor, was told everything was normal, was already on migraine preventative medications.  She does not really take Maxalt, will usually take Excedrin, maybe 2-3 times a month.  Her recent A1c was 9.  She is planning to have an endoscopy for GI issues.  She has not been wearing her glasses as much, due to fogging from the mask, thinks this may be contributing to her headaches.  She works part-time in a [employer].  She presents today for evaluation unaccompanied.  HISTORY HISTORY OF PRESENT ILLNESS:UPDATE 1/15/2020CM Laurie Sutton, 41 year old female returns for follow-up with history of migraine headaches.  She reports she has had 2 migraines in the last 6 months.  She woke up with a mild headache this morning however when  checking her fasting blood sugar it was 170.  She has had some adjustments to her insulin dose.  Weather changes are a migraine trigger for her.  She really does not have any food triggers.  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  She has not missed any work.  She returns for reevaluation   REVIEW OF SYSTEMS: Out of a complete 14 system review of symptoms, the patient complains only of the following symptoms, and all other reviewed systems are negative.  Headache  ALLERGIES: Allergies  Allergen Reactions   Amoxicillin Hives and Itching   Azithromycin     DOES NOT WORK   Dulaglutide Other (See Comments)    JANUVIA STOAMCH HURT?   Januvia [Sitagliptin] Other (See Comments)    HURTS STOAMCH   Latex     IRRITATES VAGINAL AREA   Penicillins Hives    Has patient had a PCN reaction causing immediate rash, facial/tongue/throat swelling, SOB or lightheadedness with hypotension: yes. Rash  Has patient had a PCN reaction causing severe rash involving mucus membranes or skin necrosis: Yes- rash and hives all over body  Has patient had a PCN reaction that required hospitalization No Has patient had a PCN reaction occurring within the last 10 years: Yes  If all of the above answers are "NO", then may proceed with Cephalosporin use.     HOME MEDICATIONS: Outpatient Medications Prior to Visit  Medication Sig Dispense Refill   ALPRAZolam (XANAX) 1 MG tablet TAKE 1 TABLET BY MOUTH TWICE A DAY AS NEEDED FOR ANXIETY OR SLEEP 60 tablet  5   clotrimazole-betamethasone (LOTRISONE) cream Apply 1 application topically 2 (two) times daily as needed. 30 g 5   Continuous Blood Gluc Sensor (FREESTYLE LIBRE 14 DAY SENSOR) MISC Apply topically every 14 (fourteen) days.     Cranberry 125 MG TABS Take by mouth 2 (two) times daily.     FLUoxetine (PROZAC) 40 MG capsule TAKE 1 CAPSULE BY MOUTH EVERY DAY 90 capsule 3   gabapentin (NEURONTIN) 100 MG capsule Take 1 capsule (100 mg total) by mouth 3 (three) times  daily. 90 capsule 3   insulin glargine (LANTUS SOLOSTAR) 100 UNIT/ML Solostar Pen Inject 130 Units into the skin at bedtime.     insulin lispro (HUMALOG) 100 UNIT/ML KwikPen Inject 50-Sutton Units into the skin with breakfast, with lunch, and with evening meal.     metFORMIN (GLUCOPHAGE-XR) 500 MG 24 hr tablet one pill by mouth with a meal once a day for one week then two pills by mouth with a meal once a day     montelukast (SINGULAIR) 10 MG tablet TAKE 1 TABLET BY MOUTH EVERYDAY AT BEDTIME 90 tablet 0   nortriptyline (PAMELOR) 10 MG capsule Take 3 capsules (30 mg total) by mouth at bedtime. 270 capsule 0   omeprazole (PRILOSEC) 40 MG capsule Take 40 mg by mouth in the morning.     ondansetron (ZOFRAN-ODT) 4 MG disintegrating tablet Take 1 tablet (4 mg total) by mouth every 8 (eight) hours as needed for nausea or vomiting. 20 tablet 0   propranolol (INDERAL) 40 MG tablet TAKE 1 TABLET BY MOUTH TWICE A DAY 180 tablet 0   Vitamin D, Ergocalciferol, (DRISDOL) 1.25 MG (50000 UNIT) CAPS capsule Take 1 capsule (50,000 Units total) by mouth every 7 (seven) days. 4 capsule 0   No facility-administered medications prior to visit.    PAST MEDICAL HISTORY: Past Medical History:  Diagnosis Date   ADHD    Anemia    IRON TRANSFUSION 07-2020 NONE SINCE   Anxiety    Back pain    COVID 08/12/2019   ALL SYMPTOMS REOLVED PER PT   Depression    Diabetic neuropathy ([place]) 11/11/2020   FEET   dm type 2    sees [name]    Fatty liver    GERD (gastroesophageal reflux disease)    History of kidney stones    Joint pain    Menorrhagia 11/11/2020   Migraines    Murmur, cardiac    FAINT NO CARDIOLOGIST   Other fatigue    Pneumonia 08/12/2019   COVID PNEUMONIA ALL SYMPTOMS RESOLVED PER PT   Shortness of breath on exertion     PAST SURGICAL HISTORY: Past Surgical History:  Procedure Laterality Date   CESAREAN SECTION  12/13/2006   EXTRACORPOREAL SHOCK WAVE LITHOTRIPSY  2018   FOOT SURGERY Right  10/02/2020   RIGHT FOOT HEEL AND TOES, SUR[facility] OFF [address]   LAPAROSCOPIC VAGINAL HYSTERECTOMY WITH SALPINGECTOMY Bilateral 11/17/2020   Procedure: LAPAROSCOPIC ASSISTED VAGINAL HYSTERECTOMY WITH BILATERAL SALPINGECTOMY;  Surgeon: Linda Hedges, DO;  Location: [facility];  Service: Gynecology;  Laterality: Bilateral;    FAMILY HISTORY: Family History  Problem Relation Age of Onset   Sleep apnea Mother    Anxiety disorder Mother    Depression Mother    Heart disease Mother    Hyperlipidemia Mother    Stroke Mother    Diabetes Mother    Migraines Mother    Sleep apnea Father    Anxiety disorder Father  Depression Father    Cancer Father    Diabetes Father    Bladder Cancer Father 55   Migraines Maternal Aunt    Migraines Maternal Grandmother    Diabetes Maternal Grandfather    Cerebral aneurysm Cousin    Heart disease Other    Depression Other    Anxiety disorder Other    Sleep apnea Other    Colon cancer Neg Hx    Esophageal cancer Neg Hx    Stomach cancer Neg Hx    Rectal cancer Neg Hx     SOCIAL HISTORY: Social History   Socioeconomic History   Marital status: Married    Spouse name: Adam   Number of children: 2   Years of education: Not on file   Highest education level: 12th grade  Occupational History   Occupation: Haematologist and Nutrition    Employer: [employer]: works 20 hours per week  Tobacco Use   Smoking status: Never   Smokeless tobacco: Never  Vaping Use   Vaping Use: Never used  Substance and Sexual Activity   Alcohol use: No    Alcohol/week: 0.0 standard drinks of alcohol   Drug use: No   Sexual activity: Yes    Partners: Male    Birth control/protection: None  Other Topics Concern   Not on file  Social History Narrative   Not on file   Social Determinants of Health   Financial Resource Strain: Low Risk  (06/04/2021)   Overall Financial Resource Strain (CARDIA)    Difficulty of Paying  Living Expenses: Not hard at all  Food Insecurity: No Food Insecurity (06/04/2021)   Hunger Vital Sign    Worried About Running Out of Food in the Last Year: Never true    Ran Out of Food in the Last Year: Never true  Transportation Needs: No Transportation Needs (06/04/2021)   PRAPARE - Transportation    Lack of Transportation (Medical): No    Lack of Transportation (Non-Medical): No  Physical Activity: Insufficiently Active (06/04/2021)   Exercise Vital Sign    Days of Exercise per Week: 2 days    Minutes of Exercise per Session: 10 min  Stress: Stress Concern Present (06/04/2021)   [place]    Feeling of Stress : To some extent  Social Connections: Socially Integrated (06/04/2021)   Social Connection and Isolation Panel [NHANES]    Frequency of Communication with Friends and Family: More than three times a week    Frequency of Social Gatherings with Friends and Family: More than three times a week    Attends Religious Services: More than 4 times per year    Active Member of [employer] or Organizations: Yes    Attends Music therapist: More than 4 times per year    Marital Status: Married  Human resources officer Violence: Not on file   PHYSICAL EXAM  There were no vitals filed for this visit.   There is no height or weight on file to calculate BMI.  Generalized: Well developed, in no acute distress   Neurological examination  Mentation: Alert oriented to time, place, history taking. Follows all commands speech and language fluent Cranial nerve II-XII: Pupils were equal round reactive to light. Extraocular movements were full, visual field were full on confrontational test. Facial sensation and strength were normal. Head turning and shoulder shrug  were normal and symmetric. Motor: The motor testing reveals 5 over 5 strength of  all 4 extremities. Good symmetric motor tone is noted throughout.  Sensory: Sensory  testing is intact to soft touch on all 4 extremities. No evidence of extinction is noted.  Coordination: Cerebellar testing reveals good finger-nose-finger and heel-to-shin bilaterally.  Gait and station: Gait is normal.  Reflexes: Deep tendon reflexes are symmetric and normal bilaterally.   DIAGNOSTIC DATA (LABS, IMAGING, TESTING) - I reviewed patient records, labs, notes, testing and imaging myself where available.  Lab Results  Component Value Date   WBC 3.6 (L) 06/08/2021   HGB 13.6 06/08/2021   HCT 40.0 06/08/2021   MCV 84.9 06/08/2021   PLT 91 (L) 06/08/2021      Component Value Date/Time   NA 135 06/08/2021 0910   NA 138 08/19/2017 0000   K 3.5 06/08/2021 0910   CL 102 06/08/2021 0910   CO2 23 06/08/2021 0910   GLUCOSE 204 (H) 06/08/2021 0910   BUN <5 (L) 06/08/2021 0910   BUN 8 08/19/2017 0000   CREATININE 0.50 07/22/2021 0937   CALCIUM 9.4 06/08/2021 0910   PROT 7.0 06/12/2021 1435   ALBUMIN 3.7 06/08/2021 0910   AST 55 (H) 06/08/2021 0910   ALT 39 06/08/2021 0910   ALKPHOS 107 06/08/2021 0910   BILITOT 0.8 06/08/2021 0910   GFRNONAA >60 06/08/2021 0910   GFRAA >60 08/09/2019 0459   Lab Results  Component Value Date   CHOL 169 08/19/2017   HDL 38 08/19/2017   LDLCALC 91 08/19/2017   TRIG 202 (A) 08/19/2017   CHOLHDL 4 05/12/2015   Lab Results  Component Value Date   HGBA1C 9.6 04/27/2019   Lab Results  Component Value Date   OINOMVEH20 947 06/09/2021   Lab Results  Component Value Date   TSH 1.170 06/09/2021    ASSESSMENT AND PLAN 41 y.o. year old female with:  1.  Chronic migraine headache  -Currently well controlled -Continue propanolol 40 mg twice daily -Continue nortriptyline 30 mg at bedtime -Will take Excedrin Migraine for acute headache, will d/c Maxalt due to adverse effect, can try another triptan if needed -Follow-up in 1 year or sooner if needed  Laurie Sutton, Laurie Jean, DNP 09/29/2021, 2:59 PM [facility] [address] [place], [place] [address] [phone]

## 2021-09-30 ENCOUNTER — Ambulatory Visit: Payer: Commercial Managed Care - PPO | Admitting: Neurology

## 2021-09-30 ENCOUNTER — Encounter: Payer: Self-pay | Admitting: Neurology

## 2021-09-30 VITALS — BP 110/70 | HR 83 | Ht 66.0 in | Wt 231.0 lb

## 2021-09-30 DIAGNOSIS — G43819 Other migraine, intractable, without status migrainosus: Secondary | ICD-10-CM | POA: Diagnosis not present

## 2021-09-30 MED ORDER — NORTRIPTYLINE HCL 10 MG PO CAPS: 30.0000 mg | ORAL_CAPSULE | Freq: Every day | ORAL | 3 refills | Status: DC

## 2021-09-30 MED ORDER — PROPRANOLOL HCL 60 MG PO TABS
60.0000 mg | ORAL_TABLET | Freq: Two times a day (BID) | ORAL | 5 refills | Status: DC
Start: 2021-09-30 — End: 2022-09-27

## 2021-09-30 NOTE — Patient Instructions (Addendum)
Increase propranolol to 60 mg twice daily to help your headaches, watch out for low HR, BP Call for any concerns  Meds ordered this encounter  Medications   propranolol (INDERAL) 60 MG tablet    Sig: Take 1 tablet (60 mg total) by mouth 2 (two) times daily.    Dispense:  60 tablet    Refill:  5    Do not refill yet, she is going to use up her 40 mg supply   nortriptyline (PAMELOR) 10 MG capsule    Sig: Take 3 capsules (30 mg total) by mouth at bedtime.    Dispense:  270 capsule    Refill:  3

## 2021-09-30 NOTE — Progress Notes (Signed)
Chief Complaint:   OBESITY Laurie Sutton is here to discuss her progress with her obesity treatment plan along with follow-up of her obesity related diagnoses. Laurie Sutton is on the Category 3 Plan and states she is following her eating plan approximately 25% of the time. Laurie Sutton states she is walking 5-10 minutes 1-2 times per week.  Today's visit was #: 7 Starting weight: 230 lbs Starting date: 06/09/2021 Today's weight: 226 lbs Today's date: 09/29/2021 Total lbs lost to date: 4 lbs Total lbs lost since last in-office visit: 2  Interim History: Laurie Sutton's mother had surgery since her last visit and she is now helping to care for her mother and father. Started Ozempic 4 weeks ago. Denies hunger and sometimes struggles to eat all food since starting Ozempic.  Subjective:   1. Type 2 diabetes mellitus with other specified complication, with long-term current use of insulin (HCC) Laurie Sutton is currently taking Ozempic 0.25 mg. She reports nausea 3 days after taking it. Taking Zofran PRN with relief. Her fasting blood sugars are 170-200, which increased after her mother had surgery. Reports under stress.  Her 4th dose Ozempic is scheduled for tomorrow. She is also taking Lantus 130 units daily and sliding scale as needed.   2. Vitamin D deficiency Laurie Sutton is currently taking prescription Vit D 50,000 IU once a week. Denies any nausea, vomiting or muscle weakness.  3. Nausea Noted since starting Ozempic. Takes Zofran as needed with relief.   Assessment/Plan:   1. Type 2 diabetes mellitus with other specified complication, with long-term current use of insulin (HCC) Laurie Sutton will continue Ozempic 0.25 mg once weekly. Side effects discussed.   Good blood sugar control is important to decrease the likelihood of diabetic complications such as nephropathy, neuropathy, limb loss, blindness, coronary artery disease, and death. Intensive lifestyle modification including diet, exercise and weight loss are the  first line of treatment for diabetes.    2. Vitamin D deficiency We will refill Vitamin D 50,000 IU once a week for 1 month with 0 refills.  Low Vitamin D level contributes to fatigue and are associated with obesity, breast, and colon cancer. She agrees to continue to take prescription Vitamin D @50 ,000 IU every week and will follow-up for routine testing of Vitamin D, at least 2-3 times per year to avoid over-replacement.   -Refill Vitamin D, Ergocalciferol, (DRISDOL) 1.25 MG (50000 UNIT) CAPS capsule; Take 1 capsule (50,000 Units total) by mouth every 7 (seven) days.  Dispense: 4 capsule; Refill: 0  3. Nausea We will refill Zofran ODT 4 mg by mouth as needed. Side effects discussed.   -Refill ondansetron (ZOFRAN-ODT) 4 MG disintegrating tablet; Take 1 tablet (4 mg total) by mouth every 8 (eight) hours as needed for nausea or vomiting.  Dispense: 20 tablet; Refill: 0  4. Obesity with current BMI of 35.5 Laurie Sutton is currently in the action stage of change. As such, her goal is to continue with weight loss efforts. She has agreed to the Category 3 Plan.   Exercise goals: All adults should avoid inactivity. Some physical activity is better than none, and adults who participate in any amount of physical activity gain some health benefits.  Laurie Sutton plans walk more since out of school.  Behavioral modification strategies: increasing lean protein intake, increasing water intake, and planning for success.  Laurie Sutton has agreed to follow-up with our clinic in 3 weeks. She was informed of the importance of frequent follow-up visits to maximize her success with intensive lifestyle modifications for  her multiple health conditions.   Objective:   Blood pressure 100/64, pulse 84, temperature 97.9 F (36.6 C), height 5' 7"  (1.702 m), weight 226 lb (102.5 kg), last menstrual period 11/13/2020, SpO2 96 %. Body mass index is 35.4 kg/m.  General: Cooperative, alert, well developed, in no acute  distress. HEENT: Conjunctivae and lids unremarkable. Cardiovascular: Regular rhythm.  Lungs: Normal work of breathing. Neurologic: No focal deficits.   Lab Results  Component Value Date   CREATININE 0.50 07/22/2021   BUN <5 (L) 06/08/2021   NA 135 06/08/2021   K 3.5 06/08/2021   CL 102 06/08/2021   CO2 23 06/08/2021   Lab Results  Component Value Date   ALT 39 06/08/2021   AST 55 (H) 06/08/2021   ALKPHOS 107 06/08/2021   BILITOT 0.8 06/08/2021   Lab Results  Component Value Date   HGBA1C 9.6 04/27/2019   HGBA1C 6.2 08/19/2017   HGBA1C 6.9 (H) 06/07/2016   HGBA1C 7.5 (H) 12/30/2015   HGBA1C 6.0 08/21/2015   No results found for: "INSULIN" Lab Results  Component Value Date   TSH 1.170 06/09/2021   Lab Results  Component Value Date   CHOL 169 08/19/2017   HDL 38 08/19/2017   LDLCALC 91 08/19/2017   TRIG 202 (A) 08/19/2017   CHOLHDL 4 05/12/2015   Lab Results  Component Value Date   VD25OH 12.8 (L) 06/09/2021   Lab Results  Component Value Date   WBC 3.6 (L) 06/08/2021   HGB 13.6 06/08/2021   HCT 40.0 06/08/2021   MCV 84.9 06/08/2021   PLT 91 (L) 06/08/2021   No results found for: "IRON", "TIBC", "FERRITIN"  Attestation Statements:   Reviewed by clinician on day of visit: allergies, medications, problem list, medical history, surgical history, family history, social history, and previous encounter notes.  I, Brendell Tyus, RMA, am acting as transcriptionist for Everardo Pacific, FNP..  I have reviewed the above documentation for accuracy and completeness, and I agree with the above. Everardo Pacific, FNP

## 2021-10-05 ENCOUNTER — Other Ambulatory Visit: Payer: Self-pay | Admitting: Family Medicine

## 2021-10-05 ENCOUNTER — Ambulatory Visit: Payer: Commercial Managed Care - PPO | Admitting: Podiatry

## 2021-10-05 DIAGNOSIS — M722 Plantar fascial fibromatosis: Secondary | ICD-10-CM | POA: Diagnosis not present

## 2021-10-06 ENCOUNTER — Telehealth (INDEPENDENT_AMBULATORY_CARE_PROVIDER_SITE_OTHER): Payer: Commercial Managed Care - PPO | Admitting: Psychology

## 2021-10-16 NOTE — Progress Notes (Signed)
Office Visit Note  Patient: Laurie Sutton             Date of Birth: [birth year]           MRN: [id number]             PCP: Laurey Morale, MD Referring: Isla Pence, MD Visit Date: 10/29/2021 Occupation: @GUAROCC @  Subjective:  Positive ANA  History of Present Illness: YEE GANGI is a 41 y.o. female seen in consultation per request of her gastroenterologist.  According the patient her symptoms started in August 2022 after hysterectomy.  As she states she started having increased abdominal discomfort.  She complains of chronic diarrhea and constipation since then and also generalized abdominal pain.  She reports pain under her breast and also in the inguinal region.  She had extensive work-up by her gastroenterologist and also OB/GYN which was consistent with NASH, cirrhosis of liver and splenomegaly.  She was also found to have enlarged ovaries which were baseline for her per patient.  For neutropenia and thrombocytopenia she was evaluated by hematologist and was told that it was related to his splenomegaly.  She denies any joint pain or joint swelling.  She has some discomfort in her lower back off and on.  She also gives history of intermittent right trochanteric bursitis which radiates to her right knee.  She had problems with Planter fasciitis in the past which resolved after the surgery.  There is no history of oral ulcers, nasal ulcers, malar rash, photosensitivity, Raynaud's phenomenon or lymphadenopathy.  Her father has rheumatoid arthritis.  She is gravida 2, para 2, no history of DVTs.  Activities of Daily Living:  Patient reports morning stiffness for 0 minutes.   Patient Reports nocturnal pain.  Difficulty dressing/grooming: Denies Difficulty climbing stairs: Denies Difficulty getting out of chair: Denies Difficulty using hands for taps, buttons, cutlery, and/or writing: Denies  Review of Systems  Constitutional:  Positive for fatigue.  HENT:  Positive for mouth  dryness. Negative for mouth sores.   Eyes:  Negative for dryness.  Respiratory:  Negative for shortness of breath.   Cardiovascular:  Positive for irregular heartbeat. Negative for chest pain and palpitations.  Gastrointestinal:  Positive for constipation and diarrhea. Negative for blood in stool.  Endocrine: Negative for increased urination.  Genitourinary:  Negative for involuntary urination.  Musculoskeletal:  Positive for joint pain, joint pain, myalgias and myalgias. Negative for joint swelling, muscle weakness, morning stiffness and muscle tenderness.  Skin:  Negative for color change, rash, hair loss and sensitivity to sunlight.  Allergic/Immunologic: Positive for susceptible to infections.  Neurological:  Positive for dizziness and headaches.  Hematological:  Negative for swollen glands.  Psychiatric/Behavioral:  Positive for depressed mood and sleep disturbance. The patient is nervous/anxious.     PMFS History:  Patient Active Problem List   Diagnosis Date Noted   Vitamin D deficiency 08/24/2021   Diabetes mellitus ([place]) 06/09/2021   Thrombocytopenia ([place]) 01/07/2021   Liver cirrhosis secondary to NASH (nonalcoholic steatohepatitis) ([place]) 01/07/2021   Splenomegaly, congestive, chronic 01/07/2021   Leukopenia 01/07/2021   Type 2 diabetes mellitus treated with insulin ([place]) 01/07/2021   Class II obesity 01/07/2021   Menorrhagia 11/17/2020   S/P laparoscopic assisted vaginal hysterectomy ([facility]) 11/17/2020   COVID-19 virus infection 08/07/2019   Allergic rhinitis 07/25/2017   Hepatic steatosis 06/09/2016   Diabetes mellitus without complication ([place]) 01/60/1093   GERD (gastroesophageal reflux disease) 01/22/2014   Low back pain 11/12/2013   Migraine headache  08/06/2013   Frequent UTI 02/29/2012   Migraines 02/16/2011   Depression with anxiety 02/16/2011    Past Medical History:  Diagnosis Date   ADHD    Anemia    IRON TRANSFUSION 07-2020 NONE SINCE   Anxiety    Back  pain    COVID 08/12/2019   ALL SYMPTOMS REOLVED PER PT   Depression    Diabetic neuropathy ([place]) 11/11/2020   FEET   dm type 2    sees [name]    Fatty liver    GERD (gastroesophageal reflux disease)    History of kidney stones    Joint pain    Menorrhagia 11/11/2020   Migraines    Murmur, cardiac    FAINT NO CARDIOLOGIST   Other fatigue    Pneumonia 08/12/2019   COVID PNEUMONIA ALL SYMPTOMS RESOLVED PER PT   Shortness of breath on exertion     Family History  Problem Relation Age of Onset   Depression Mother    Anxiety disorder Mother    Sleep apnea Father    Anxiety disorder Father    Depression Father    Cancer Father    Diabetes Father    Bladder Cancer Father 52   Rheum arthritis Father    Migraines Maternal Aunt    Migraines Maternal Grandmother    Diabetes Maternal Grandfather    Healthy Daughter    Asthma Daughter    Anxiety disorder Daughter    Anxiety disorder Son    Asthma Son    Healthy Son    Cerebral aneurysm Cousin    Heart disease Other    Depression Other    Anxiety disorder Other    Sleep apnea Other    Colon cancer Neg Hx    Esophageal cancer Neg Hx    Stomach cancer Neg Hx    Rectal cancer Neg Hx    Past Surgical History:  Procedure Laterality Date   CESAREAN SECTION  12/13/2006   EXTRACORPOREAL SHOCK WAVE LITHOTRIPSY  2018   FOOT SURGERY Right 10/02/2020   RIGHT FOOT HEEL AND TOES, [facility] OFF [address]   LAPAROSCOPIC VAGINAL HYSTERECTOMY WITH SALPINGECTOMY Bilateral 11/17/2020   Procedure: LAPAROSCOPIC ASSISTED VAGINAL HYSTERECTOMY WITH BILATERAL SALPINGECTOMY;  Surgeon: Linda Hedges, DO;  Location: [facility];  Service: Gynecology;  Laterality: Bilateral;   Social History   Social History Narrative   Not on file   Immunization History  Administered Date(s) Administered   Hepb-cpg 06/12/2021, 07/13/2021   PFIZER(Purple Top)SARS-COV-2 Vaccination 11/25/2019, 12/23/2019   PPD Test 01/12/2016      Objective: Vital Signs: BP 119/78 (BP Location: Right Arm, Patient Position: Sitting, Cuff Size: Normal)   Pulse 80   Ht 5' 6"  (1.676 m)   Wt 230 lb 6.4 oz (104.5 kg)   LMP 11/13/2020 (Exact Date)   BMI 37.19 kg/m    Physical Exam Vitals and nursing note reviewed.  Constitutional:      Appearance: She is well-developed.  HENT:     Head: Normocephalic and atraumatic.  Eyes:     Conjunctiva/sclera: Conjunctivae normal.  Cardiovascular:     Rate and Rhythm: Normal rate and regular rhythm.     Heart sounds: Normal heart sounds.  Pulmonary:     Effort: Pulmonary effort is normal.     Breath sounds: Normal breath sounds.  Abdominal:     General: Bowel sounds are normal.     Palpations: Abdomen is soft.  Musculoskeletal:     Cervical back: Normal  range of motion.  Lymphadenopathy:     Cervical: No cervical adenopathy.  Skin:    General: Skin is warm and dry.     Capillary Refill: Capillary refill takes less than 2 seconds.  Neurological:     Mental Status: She is alert and oriented to person, place, and time.  Psychiatric:        Behavior: Behavior normal.      Musculoskeletal Exam: C-spine, thoracic and lumbar spine were in good range of motion.  Shoulder joints, elbow joints, wrist joints, MCPs PIPs and DIPs with good range of motion with no synovitis.  Hip joints, knee joints, ankles, MTPs and PIPs with good range of motion with no synovitis.  CDAI Exam: CDAI Score: -- Patient Global: --; Provider Global: -- Swollen: --; Tender: -- Joint Exam 10/29/2021   No joint exam has been documented for this visit   There is currently no information documented on the homunculus. Go to the Rheumatology activity and complete the homunculus joint exam.  Investigation: No additional findings.  Imaging: No results found.  Recent Labs: Lab Results  Component Value Date   WBC 3.6 (L) 06/08/2021   HGB 13.6 06/08/2021   PLT 91 (L) 06/08/2021   NA 135 06/08/2021   K 3.5  06/08/2021   CL 102 06/08/2021   CO2 23 06/08/2021   GLUCOSE 204 (H) 06/08/2021   BUN <5 (L) 06/08/2021   CREATININE 0.50 07/22/2021   BILITOT 0.8 06/08/2021   ALKPHOS 107 06/08/2021   AST 55 (H) 06/08/2021   ALT 39 06/08/2021   PROT 7.0 06/12/2021   ALBUMIN 3.7 06/08/2021   CALCIUM 9.4 06/08/2021   GFRAA >60 08/09/2019    Speciality Comments: No specialty comments available.  Procedures:  No procedures performed Allergies: Amoxicillin, Azithromycin, Dulaglutide, Januvia [sitagliptin], Latex, and Penicillins   Assessment / Plan:     Visit Diagnoses: Positive ANA (antinuclear antibody) - 06/05/21: ANA 1:320 nuclear, dense fine speckled -patient's recent serology showed positive ANA.  She denies any history of oral ulcers, nasal ulcers, malar rash, photosensitivity, Raynaud's phenomenon, inflammatory arthritis or lymphadenopathy.  She gives history of fatigue and dry mouth.  To complete the work-up I will obtain following labs.  Plan: CBC with Differential/Platelet, COMPLETE METABOLIC PANEL WITH GFR, Urinalysis, Routine w reflex microscopic, Sedimentation rate, ANA, Anti-scleroderma antibody, RNP Antibody, Anti-Smith antibody, Sjogrens syndrome-A extractable nuclear antibody, Sjogrens syndrome-B extractable nuclear antibody, Anti-DNA antibody, double-stranded, C3 and C4, Beta-2 glycoprotein antibodies, Cardiolipin antibodies, IgG, IgM, IgA, Lupus Anticoagulant Eval w/Reflex. Patient gives history of intermittent lower back pain.  She had good mobility without any point tenderness.  She also gives history of intermittent right trochanteric bursitis.  She had no tenderness on examination.  She had Planter fasciitis in the past which improved after surgery.  Other neutropenia (HCC)-on lab review she has had neutropenia at least since 2021.  She also had thrombocytopenia for the same duration.  She was evaluated by Dr. Alvy Bimler.  I reviewed the records and there was mention of neutropenia and  thrombocytopenia related to splenomegaly.  Thrombocytopenia (HCC)-chronic thrombocytopenia most likely due to splenomegaly per hematology.  Type 2 diabetes mellitus treated with insulin (HCC)  Liver cirrhosis secondary to NASH (nonalcoholic steatohepatitis) (HCC)-her LFTs have been elevated since at least 2021 based on chart review.  She had extensive work-up including CT of the abdomen.  She had fatty liver.  She was also found to have cirrhosis of liver with hepatosplenomegaly.  Cholelithiasis was also noted.  Fatty liver  Splenomegaly, congestive, chronic-according to the CT report she had marked splenomegaly suggesting portal hypertension.  History of gastroesophageal reflux (GERD)-according to the patient reflux symptoms are manageable with Prilosec.  Chronic constipation-patient gives history of intermittent diarrhea and constipation.  She states that the constipation is her major concern currently.  BMI 37.0-37.9, adult-weight loss and dietary modifications were discussed.  Hx of migraines-she has chronic history of migraines.  Frequent UTI  Vitamin D deficiency -her vitamin D was 12.8 on June 09, 2021.  She has been taking vitamin D 50,000 units once a week.  We will check vitamin D level today.  Plan: VITAMIN D 25 Hydroxy (Vit-D Deficiency, Fractures)  Depression with anxiety-she is on Prozac and nortriptyline.  Family history of rheumatoid arthritis-father  Orders: Orders Placed This Encounter  Procedures   CBC with Differential/Platelet   COMPLETE METABOLIC PANEL WITH GFR   Urinalysis, Routine w reflex microscopic   Sedimentation rate   VITAMIN D 25 Hydroxy (Vit-D Deficiency, Fractures)   ANA   Anti-scleroderma antibody   RNP Antibody   Anti-Smith antibody   Sjogrens syndrome-A extractable nuclear antibody   Sjogrens syndrome-B extractable nuclear antibody   Anti-DNA antibody, double-stranded   C3 and C4   Beta-2 glycoprotein antibodies   Cardiolipin  antibodies, IgG, IgM, IgA   Lupus Anticoagulant Eval w/Reflex   No orders of the defined types were placed in this encounter.    Follow-Up Instructions: Return for Positive ANA.   Bo Merino, MD  Note - This record has been created using Editor, commissioning.  Chart creation errors have been sought, but may not always  have been located. Such creation errors do not reflect on  the standard of medical care.

## 2021-10-21 ENCOUNTER — Encounter (INDEPENDENT_AMBULATORY_CARE_PROVIDER_SITE_OTHER): Payer: Self-pay | Admitting: Nurse Practitioner

## 2021-10-21 ENCOUNTER — Ambulatory Visit (INDEPENDENT_AMBULATORY_CARE_PROVIDER_SITE_OTHER): Payer: Commercial Managed Care - PPO | Admitting: Nurse Practitioner

## 2021-10-21 VITALS — BP 99/66 | HR 79 | Temp 98.2°F | Ht 66.0 in | Wt 225.0 lb

## 2021-10-21 DIAGNOSIS — K5903 Drug induced constipation: Secondary | ICD-10-CM

## 2021-10-21 DIAGNOSIS — Z6835 Body mass index (BMI) 35.0-35.9, adult: Secondary | ICD-10-CM | POA: Diagnosis not present

## 2021-10-21 DIAGNOSIS — Z7984 Long term (current) use of oral hypoglycemic drugs: Secondary | ICD-10-CM

## 2021-10-21 DIAGNOSIS — E669 Obesity, unspecified: Secondary | ICD-10-CM

## 2021-10-21 DIAGNOSIS — E1169 Type 2 diabetes mellitus with other specified complication: Secondary | ICD-10-CM

## 2021-10-21 DIAGNOSIS — Z7985 Long-term (current) use of injectable non-insulin antidiabetic drugs: Secondary | ICD-10-CM

## 2021-10-21 DIAGNOSIS — Z794 Long term (current) use of insulin: Secondary | ICD-10-CM

## 2021-10-21 MED ORDER — DOCUSATE SODIUM 100 MG PO CAPS: 100.0000 mg | ORAL_CAPSULE | Freq: Two times a day (BID) | ORAL | 0 refills | Status: DC | PRN

## 2021-10-22 NOTE — Progress Notes (Signed)
Chief Complaint:   OBESITY Laurie Sutton is here to discuss her progress with her obesity treatment plan along with follow-up of her obesity related diagnoses. Laurie Sutton is on the Category 3 Plan and states she is following her eating plan approximately 50% of the time. Laurie Sutton states she is doing Lazy Girl yoga 7 minutes 5 times per week.  Today's visit was #: 8 Starting weight: 230 lbs Starting date: 06/09/2021 Today's weight: 225 lbs Today's date: 7/12/203 Total lbs lost to date: 5 lbs Total lbs lost since last in-office visit: 1  Interim History: Laurie Sutton is skipping meals due to decreased appetite, nausea and constipation. Symptoms have gotten worse since increase of Ozempic. Managed by [name].  She is taking Zofran PRN.   She is drinking water and diet soda.  Subjective:   1. Type 2 diabetes mellitus with other specified complication, with long-term current use of insulin (HCC) Laurie Sutton is currently on Ozempic 0.5 mg ( increased at last visit). Notes side effect of nausea and constipation. Fasting blood sugars 124. Saw [name] 10/16/21, reported A1c was 8. She is taking Lantus 175 units and Humalog 50-70 units with meals. She also is taking Metformin 1000 mg daily.   2. Drug-induced constipation Laurie Sutton is to increase water intake, exercise, take Colace, and to increase veggies and fruit.  Assessment/Plan:   1. Type 2 diabetes mellitus with other specified complication, with long-term current use of insulin (HCC) Continue to follow up with [name].  Needs to contact [name] to discuss her medications and side effects.   Good blood sugar control is important to decrease the likelihood of diabetic complications such as nephropathy, neuropathy, limb loss, blindness, coronary artery disease, and death. Intensive lifestyle modification including diet, exercise and weight loss are the first line of treatment for diabetes.    2. Drug-induced constipation START Colace 100 mg by mouth twice a  day as need daily #20. Side effects discussed. Also to discuss with [name].  -Start docusate sodium (COLACE) 100 MG capsule; Take 1 capsule (100 mg total) by mouth 2 (two) times daily as needed for mild constipation.  Dispense: 20 capsule; Refill: 0  3. Obesity with current BMI of 35.3 Laurie Sutton is currently in the action stage of change. As such, her goal is to continue with weight loss efforts. She has agreed to the Category 3 Plan.   Laurie Sutton will start protein shakes for meal replacement. Handout given: Dining out guide. She is journaling using My[employer]itness Pal. To review at next visit.  Exercise goals: All adults should avoid inactivity. Some physical activity is better than none, and adults who participate in any amount of physical activity gain some health benefits.  Behavioral modification strategies: increasing lean protein intake, increasing water intake, and planning for success.  Laurie Sutton has agreed to follow-up with our clinic in 3 weeks. She was informed of the importance of frequent follow-up visits to maximize her success with intensive lifestyle modifications for her multiple health conditions.   Objective:   Blood pressure 99/66, pulse 79, temperature 98.2 F (36.8 C), height 5' 6"  (1.676 m), weight 225 lb (102.1 kg), last menstrual period 11/13/2020, SpO2 97 %. Body mass index is 36.32 kg/m.  General: Cooperative, alert, well developed, in no acute distress. HEENT: Conjunctivae and lids unremarkable. Cardiovascular: Regular rhythm.  Lungs: Normal work of breathing. Neurologic: No focal deficits.   Lab Results  Component Value Date   CREATININE 0.50 07/22/2021   BUN <5 (L) 06/08/2021  NA 135 06/08/2021   K 3.5 06/08/2021   CL 102 06/08/2021   CO2 23 06/08/2021   Lab Results  Component Value Date   ALT 39 06/08/2021   AST 55 (H) 06/08/2021   ALKPHOS 107 06/08/2021   BILITOT 0.8 06/08/2021   Lab Results  Component Value Date   HGBA1C 9.6 04/27/2019   HGBA1C 6.2  08/19/2017   HGBA1C 6.9 (H) 06/07/2016   HGBA1C 7.5 (H) 12/30/2015   HGBA1C 6.0 08/21/2015   No results found for: "INSULIN" Lab Results  Component Value Date   TSH 1.170 06/09/2021   Lab Results  Component Value Date   CHOL 169 08/19/2017   HDL 38 08/19/2017   LDLCALC 91 08/19/2017   TRIG 202 (A) 08/19/2017   CHOLHDL 4 05/12/2015   Lab Results  Component Value Date   VD25OH 12.8 (L) 06/09/2021   Lab Results  Component Value Date   WBC 3.6 (L) 06/08/2021   HGB 13.6 06/08/2021   HCT 40.0 06/08/2021   MCV 84.9 06/08/2021   PLT 91 (L) 06/08/2021   No results found for: "IRON", "TIBC", "FERRITIN"  Attestation Statements:   Reviewed by clinician on day of visit: allergies, medications, problem list, medical history, surgical history, family history, social history, and previous encounter notes.  I, Brendell Tyus, RMA, am acting as transcriptionist for Everardo Pacific, FNP.  I have reviewed the above documentation for accuracy and completeness, and I agree with the above. Everardo Pacific, FNP

## 2021-10-29 ENCOUNTER — Ambulatory Visit: Payer: Commercial Managed Care - PPO | Admitting: Rheumatology

## 2021-10-29 ENCOUNTER — Encounter: Payer: Self-pay | Admitting: Rheumatology

## 2021-10-29 VITALS — BP 119/78 | HR 80 | Ht 66.0 in | Wt 230.4 lb

## 2021-10-29 DIAGNOSIS — K760 Fatty (change of) liver, not elsewhere classified: Secondary | ICD-10-CM

## 2021-10-29 DIAGNOSIS — F418 Other specified anxiety disorders: Secondary | ICD-10-CM

## 2021-10-29 DIAGNOSIS — E559 Vitamin D deficiency, unspecified: Secondary | ICD-10-CM

## 2021-10-29 DIAGNOSIS — R768 Other specified abnormal immunological findings in serum: Secondary | ICD-10-CM | POA: Diagnosis not present

## 2021-10-29 DIAGNOSIS — Z8669 Personal history of other diseases of the nervous system and sense organs: Secondary | ICD-10-CM

## 2021-10-29 DIAGNOSIS — Z8261 Family history of arthritis: Secondary | ICD-10-CM

## 2021-10-29 DIAGNOSIS — K7581 Nonalcoholic steatohepatitis (NASH): Secondary | ICD-10-CM

## 2021-10-29 DIAGNOSIS — D708 Other neutropenia: Secondary | ICD-10-CM | POA: Diagnosis not present

## 2021-10-29 DIAGNOSIS — N390 Urinary tract infection, site not specified: Secondary | ICD-10-CM

## 2021-10-29 DIAGNOSIS — K5909 Other constipation: Secondary | ICD-10-CM

## 2021-10-29 DIAGNOSIS — D732 Chronic congestive splenomegaly: Secondary | ICD-10-CM

## 2021-10-29 DIAGNOSIS — D696 Thrombocytopenia, unspecified: Secondary | ICD-10-CM | POA: Diagnosis not present

## 2021-10-29 DIAGNOSIS — K7460 Unspecified cirrhosis of liver: Secondary | ICD-10-CM

## 2021-10-29 DIAGNOSIS — Z794 Long term (current) use of insulin: Secondary | ICD-10-CM

## 2021-10-29 DIAGNOSIS — E119 Type 2 diabetes mellitus without complications: Secondary | ICD-10-CM | POA: Diagnosis not present

## 2021-10-29 DIAGNOSIS — Z8719 Personal history of other diseases of the digestive system: Secondary | ICD-10-CM

## 2021-10-29 DIAGNOSIS — Z6837 Body mass index (BMI) 37.0-37.9, adult: Secondary | ICD-10-CM

## 2021-10-30 NOTE — Progress Notes (Signed)
Glucose is elevated.  Please notify patient and forward results to her PCP.  LFTs are elevated.  White cell count is low at 2.8.  Rest the labs are pending.

## 2021-11-06 LAB — COMPLETE METABOLIC PANEL WITH GFR
AG Ratio: 1.3 (calc) (ref 1.0–2.5)
ALT: 36 U/L — ABNORMAL HIGH (ref 6–29)
AST: 53 U/L — ABNORMAL HIGH (ref 10–30)
Albumin: 4.1 g/dL (ref 3.6–5.1)
Alkaline phosphatase (APISO): 165 U/L — ABNORMAL HIGH (ref 31–125)
BUN/Creatinine Ratio: 10 (calc) (ref 6–22)
BUN: 4 mg/dL — ABNORMAL LOW (ref 7–25)
CO2: 25 mmol/L (ref 20–32)
Calcium: 9.3 mg/dL (ref 8.6–10.2)
Chloride: 100 mmol/L (ref 98–110)
Creat: 0.42 mg/dL — ABNORMAL LOW (ref 0.50–0.99)
Globulin: 3.2 g/dL (calc) (ref 1.9–3.7)
Glucose, Bld: 339 mg/dL — ABNORMAL HIGH (ref 65–99)
Potassium: 3.9 mmol/L (ref 3.5–5.3)
Sodium: 135 mmol/L (ref 135–146)
Total Bilirubin: 0.8 mg/dL (ref 0.2–1.2)
Total Protein: 7.3 g/dL (ref 6.1–8.1)
eGFR: 127 mL/min/{1.73_m2} (ref >=60)

## 2021-11-06 LAB — CARDIOLIPIN ANTIBODIES, IGG, IGM, IGA
Anticardiolipin IgA: <2 APL-U/mL (ref <20.0)
Anticardiolipin IgG: <2 GPL-U/mL (ref <20.0)
Anticardiolipin IgM: <2 MPL-U/mL (ref <20.0)

## 2021-11-06 LAB — CBC WITH DIFFERENTIAL/PLATELET
Absolute Monocytes: 260 cells/uL (ref 200–950)
Basophils Absolute: 20 cells/uL (ref 0–200)
Basophils Relative: 0.7 %
Eosinophils Absolute: 70 cells/uL (ref 15–500)
Eosinophils Relative: 2.5 %
HCT: 40.5 % (ref 35.0–45.0)
Hemoglobin: 13.3 g/dL (ref 11.7–15.5)
Lymphs Abs: 680 cells/uL — ABNORMAL LOW (ref 850–3900)
MCH: 28.6 pg (ref 27.0–33.0)
MCHC: 32.8 g/dL (ref 32.0–36.0)
MCV: 87.1 fL (ref 80.0–100.0)
MPV: 10.2 fL (ref 7.5–12.5)
Monocytes Relative: 9.3 %
Neutro Abs: 1770 cells/uL (ref 1500–7800)
Neutrophils Relative %: 63.2 %
Platelets: 83 10*3/uL — ABNORMAL LOW (ref 140–400)
RBC: 4.65 10*6/uL (ref 3.80–5.10)
RDW: 15.1 % — ABNORMAL HIGH (ref 11.0–15.0)
Total Lymphocyte: 24.3 %
WBC: 2.8 10*3/uL — ABNORMAL LOW (ref 3.8–10.8)

## 2021-11-06 LAB — C3 AND C4
C3 Complement: 152 mg/dL (ref 83–193)
C4 Complement: 17 mg/dL (ref 15–57)

## 2021-11-06 LAB — URINALYSIS, ROUTINE W REFLEX MICROSCOPIC
Bilirubin Urine: NEGATIVE
Hgb urine dipstick: NEGATIVE
Ketones, ur: NEGATIVE
Leukocytes,Ua: NEGATIVE
Nitrite: NEGATIVE
Protein, ur: NEGATIVE
Specific Gravity, Urine: 1.032 (ref 1.001–1.035)
pH: 8 (ref 5.0–8.0)

## 2021-11-06 LAB — SEDIMENTATION RATE: Sed Rate: 14 mm/h (ref 0–20)

## 2021-11-06 LAB — LUPUS ANTICOAGULANT EVAL W/ REFLEX
PTT-LA Screen: 40 s (ref <=40)
dRVVT: 34 s (ref <=45)

## 2021-11-06 LAB — ANTI-DNA ANTIBODY, DOUBLE-STRANDED: ds DNA Ab: 2 IU/mL

## 2021-11-06 LAB — ANTI-NUCLEAR AB-TITER (ANA TITER): ANA Titer 1: 1:320 {titer} — ABNORMAL HIGH

## 2021-11-06 LAB — ANA: Anti Nuclear Antibody (ANA): POSITIVE — AB

## 2021-11-06 LAB — ANTI-SMITH ANTIBODY: ENA SM Ab Ser-aCnc: <1 AI

## 2021-11-06 LAB — BETA-2 GLYCOPROTEIN ANTIBODIES
Beta-2 Glyco 1 IgA: <2 U/mL (ref <20.0)
Beta-2 Glyco 1 IgM: <2 U/mL (ref <20.0)
Beta-2 Glyco I IgG: <2 U/mL (ref <20.0)

## 2021-11-06 LAB — RNP ANTIBODY: Ribonucleic Protein(ENA) Antibody, IgG: <1 AI

## 2021-11-06 LAB — ANTI-SCLERODERMA ANTIBODY: Scleroderma (Scl-70) (ENA) Antibody, IgG: <1 AI

## 2021-11-06 LAB — SJOGRENS SYNDROME-B EXTRACTABLE NUCLEAR ANTIBODY: SSB (La) (ENA) Antibody, IgG: <1 AI

## 2021-11-06 LAB — VITAMIN D 25 HYDROXY (VIT D DEFICIENCY, FRACTURES): Vit D, 25-Hydroxy: 34 ng/mL (ref 30–100)

## 2021-11-06 LAB — SJOGRENS SYNDROME-A EXTRACTABLE NUCLEAR ANTIBODY: SSA (Ro) (ENA) Antibody, IgG: <1 AI

## 2021-11-11 ENCOUNTER — Encounter (INDEPENDENT_AMBULATORY_CARE_PROVIDER_SITE_OTHER): Payer: Self-pay | Admitting: Nurse Practitioner

## 2021-11-11 ENCOUNTER — Ambulatory Visit (INDEPENDENT_AMBULATORY_CARE_PROVIDER_SITE_OTHER): Payer: Commercial Managed Care - PPO | Admitting: Nurse Practitioner

## 2021-11-11 VITALS — BP 112/69 | HR 76 | Temp 98.2°F | Ht 66.0 in | Wt 231.0 lb

## 2021-11-11 DIAGNOSIS — E1169 Type 2 diabetes mellitus with other specified complication: Secondary | ICD-10-CM

## 2021-11-11 DIAGNOSIS — Z6837 Body mass index (BMI) 37.0-37.9, adult: Secondary | ICD-10-CM

## 2021-11-11 DIAGNOSIS — K5903 Drug induced constipation: Secondary | ICD-10-CM | POA: Diagnosis not present

## 2021-11-11 DIAGNOSIS — E669 Obesity, unspecified: Secondary | ICD-10-CM | POA: Diagnosis not present

## 2021-11-11 DIAGNOSIS — Z794 Long term (current) use of insulin: Secondary | ICD-10-CM

## 2021-11-15 NOTE — Progress Notes (Signed)
Office Visit Note  Patient: Laurie Sutton             Date of Birth: 12/13/1980           MRN: [id number]             PCP: Laurey Morale, MD Referring: Isla Pence, MD Visit Date: 11/26/2021 Occupation: @GUAROCC @  Subjective:  Positive ANA and dry mouth  History of Present Illness: Laurie Sutton is a 41 y.o. female with history of positive ANA and dry mouth.  She states she continues to have dry mouth symptoms.  She denies any history of dry eyes.  She continues to have some fatigue.  There is no history of oral ulcers, nasal ulcers, malar rash, photosensitivity, Raynaud's phenomenon or lymphadenopathy.  She has some discomfort in her thumbs off and on when she opens jars and bottles.  Of the other joints are painful.  Activities of Daily Living:  Patient reports morning stiffness for a few minutes.   Patient Reports nocturnal pain.  Difficulty dressing/grooming: Denies Difficulty climbing stairs: Reports Difficulty getting out of chair: Denies Difficulty using hands for taps, buttons, cutlery, and/or writing: Reports  Review of Systems  Constitutional:  Positive for fatigue.  HENT:  Positive for mouth dryness. Negative for mouth sores.   Eyes:  Negative for dryness.  Respiratory:  Negative for shortness of breath.   Cardiovascular:  Negative for chest pain and palpitations.  Gastrointestinal:  Positive for constipation and diarrhea. Negative for blood in stool.  Endocrine: Negative for increased urination.  Genitourinary:  Negative for involuntary urination.  Musculoskeletal:  Positive for joint pain, joint pain, myalgias, morning stiffness, muscle tenderness and myalgias. Negative for gait problem, joint swelling and muscle weakness.  Skin:  Negative for color change, rash, hair loss and sensitivity to sunlight.  Allergic/Immunologic: Negative for susceptible to infections.  Neurological:  Positive for headaches. Negative for dizziness.  Hematological:  Negative  for swollen glands.  Psychiatric/Behavioral:  Positive for depressed mood and sleep disturbance. The patient is nervous/anxious.     PMFS History:  Patient Active Problem List   Diagnosis Date Noted   Vitamin D deficiency 08/24/2021   Diabetes mellitus ([facility]) 06/09/2021   Thrombocytopenia ([place]) 01/07/2021   Liver cirrhosis secondary to NASH (nonalcoholic steatohepatitis) ([facility]) 01/07/2021   Splenomegaly, congestive, chronic 01/07/2021   Leukopenia 01/07/2021   Type 2 diabetes mellitus treated with insulin ([facility]) 01/07/2021   Class II obesity 01/07/2021   Menorrhagia 11/17/2020   S/P laparoscopic assisted vaginal hysterectomy ([facility]) 11/17/2020   COVID-19 virus infection 08/07/2019   Allergic rhinitis 07/25/2017   Hepatic steatosis 06/09/2016   Diabetes mellitus without complication ([facility]) 62/70/3500   GERD (gastroesophageal reflux disease) 01/22/2014   Low back pain 11/12/2013   Migraine headache 08/06/2013   Frequent UTI 02/29/2012   Migraines 02/16/2011   Depression with anxiety 02/16/2011    Past Medical History:  Diagnosis Date   ADHD    Anemia    IRON TRANSFUSION 07-2020 NONE SINCE   Anxiety    Back pain    COVID 08/12/2019   ALL SYMPTOMS REOLVED PER PT   Depression    Diabetic neuropathy ([facility] [facility]) 11/11/2020   FEET   dm type 2    sees [name]    Fatty liver    GERD (gastroesophageal reflux disease)    History of kidney stones    Joint pain    Menorrhagia 11/11/2020   Migraines    Murmur, cardiac  FAINT NO CARDIOLOGIST   Other fatigue    Pneumonia 08/12/2019   COVID PNEUMONIA ALL SYMPTOMS RESOLVED PER PT   Shortness of breath on exertion     Family History  Problem Relation Age of Onset   Depression Mother    Anxiety disorder Mother    Sleep apnea Father    Anxiety disorder Father    Depression Father    Cancer Father    Diabetes Father    Bladder Cancer Father 45   Rheum arthritis Father    Migraines Maternal Aunt    Migraines Maternal Grandmother     Diabetes Maternal Grandfather    Healthy Daughter    Asthma Daughter    Anxiety disorder Daughter    Anxiety disorder Son    Asthma Son    Healthy Son    Cerebral aneurysm Cousin    Heart disease Other    Depression Other    Anxiety disorder Other    Sleep apnea Other    Colon cancer Neg Hx    Esophageal cancer Neg Hx    Stomach cancer Neg Hx    Rectal cancer Neg Hx    Past Surgical History:  Procedure Laterality Date   CESAREAN SECTION  12/13/2006   EXTRACORPOREAL SHOCK WAVE LITHOTRIPSY  2018   FOOT SURGERY Right 10/02/2020   RIGHT FOOT HEEL AND TOES, [facility] OFF [address]   LAPAROSCOPIC VAGINAL HYSTERECTOMY WITH SALPINGECTOMY Bilateral 11/17/2020   Procedure: LAPAROSCOPIC ASSISTED VAGINAL HYSTERECTOMY WITH BILATERAL SALPINGECTOMY;  Surgeon: Linda Hedges, DO;  Location: [facility];  Service: Gynecology;  Laterality: Bilateral;   Social History   Social History Narrative   Not on file   Immunization History  Administered Date(s) Administered   Hepb-cpg 06/12/2021, 07/13/2021   PFIZER(Purple Top)SARS-COV-2 Vaccination 11/25/2019, 12/23/2019   PPD Test 01/12/2016     Objective: Vital Signs: BP 114/72 (BP Location: Left Arm, Patient Position: Sitting, Cuff Size: Normal)   Pulse 67   Resp 16   Ht 5' 6"  (1.676 m)   Wt 229 lb 6.4 oz (104.1 kg)   LMP 11/13/2020 (Exact Date)   BMI 37.03 kg/m    Physical Exam Vitals and nursing note reviewed.  Constitutional:      Appearance: She is well-developed.  HENT:     Head: Normocephalic and atraumatic.  Eyes:     Conjunctiva/sclera: Conjunctivae normal.  Cardiovascular:     Rate and Rhythm: Normal rate and regular rhythm.     Heart sounds: Normal heart sounds.  Pulmonary:     Effort: Pulmonary effort is normal.     Breath sounds: Normal breath sounds.  Abdominal:     General: Bowel sounds are normal.     Palpations: Abdomen is soft.  Musculoskeletal:     Cervical back: Normal range of motion.   Lymphadenopathy:     Cervical: No cervical adenopathy.  Skin:    General: Skin is warm and dry.     Capillary Refill: Capillary refill takes less than 2 seconds.  Neurological:     Mental Status: She is alert and oriented to person, place, and time.  Psychiatric:        Behavior: Behavior normal.      Musculoskeletal Exam: Cervical, thoracic and lumbar spine were in good range of motion.  Shoulder joints, elbow joints, wrist joints, MCPs PIPs and DIPs with good range of motion with no synovitis.  Hip joints, knee joints, ankles, MTPs and PIPs with good range of motion with no  synovitis.  CDAI Exam: CDAI Score: -- Patient Global: --; Provider Global: -- Swollen: --; Tender: -- Joint Exam 11/26/2021   No joint exam has been documented for this visit   There is currently no information documented on the homunculus. Go to the Rheumatology activity and complete the homunculus joint exam.  Investigation: No additional findings.  Imaging: No results found.  Recent Labs: Lab Results  Component Value Date   WBC 2.8 (L) 10/29/2021   HGB 13.3 10/29/2021   PLT 83 (L) 10/29/2021   NA 135 10/29/2021   K 3.9 10/29/2021   CL 100 10/29/2021   CO2 25 10/29/2021   GLUCOSE 339 (H) 10/29/2021   BUN 4 (L) 10/29/2021   CREATININE 0.42 (L) 10/29/2021   BILITOT 0.8 10/29/2021   ALKPHOS 107 06/08/2021   AST 53 (H) 10/29/2021   ALT 36 (H) 10/29/2021   PROT 7.3 10/29/2021   ALBUMIN 3.7 06/08/2021   CALCIUM 9.3 10/29/2021   GFRAA >60 08/09/2019       Speciality Comments: No specialty comments available.  Procedures:  No procedures performed Allergies: Amoxicillin, Azithromycin, Dulaglutide, Januvia [sitagliptin], Latex, and Penicillins   Assessment / Plan:     Visit Diagnoses: Positive ANA (antinuclear antibody) -October 29, 2021 UA 2+ glucose, ANA 1: 320NS, ENA negative, C3-C4 normal, beta-2 GP 1 negative, anticardiolipin negative, lupus anticoagulant negative, ESR 14, vitamin D 34.   I had a detailed discussion with the patient regarding the lab results.  She has positive ANA but all other antibodies are negative.  Her complements are normal.  There is no h/o oral ulcers, malar rash, photosensitivity, Raynaud's, inflammatory arthritis or adenopathy.  She gives history of dry mouth which could be related to the medication she is taking.  I did detailed discussion regarding over-the-counter products for dry mouth.  We will see response to it.  I advised her to contact me if she develops any new symptoms.  I will see her again in 1 year and she will have labs prior to her next appointment.  Other neutropenia (HCC) - Related to splenomegaly per Dr. Alvy Bimler.  Thrombocytopenia (HCC) - Related to splenomegaly per Dr. Alvy Bimler.  Vitamin D deficiency - Vitamin D was 12.8 on June 09, 2021.  She was on vitamin D 50,000 units once a week.  Vitamin D 34 now.  Liver cirrhosis secondary to NASH (nonalcoholic steatohepatitis) (HCC) - Chronic elevation of LFTs since 2021.  Fatty liver  Splenomegaly, congestive, chronic - Related to portal hypertension.  Type 2 diabetes mellitus treated with insulin (HCC)  History of gastroesophageal reflux (GERD)  Chronic constipation  BMI 37.0-37.9, adult  Hx of migraines  Frequent UTI  Depression with anxiety  Family history of rheumatoid arthritis-father  Orders: Orders Placed This Encounter  Procedures   CBC with Differential/Platelet   COMPLETE METABOLIC PANEL WITH GFR   Sedimentation rate   C3 and C4   ANA   Sjogrens syndrome-A extractable nuclear antibody   Sjogrens syndrome-B extractable nuclear antibody   Anti-DNA antibody, double-stranded   No orders of the defined types were placed in this encounter.    Follow-Up Instructions: Return in about 1 year (around 11/27/2022) for +ANA, dry mouth.   Bo Merino, MD  Note - This record has been created using Editor, commissioning.  Chart creation errors have been sought,  but may not always  have been located. Such creation errors do not reflect on  the standard of medical care.

## 2021-11-16 NOTE — Progress Notes (Signed)
Chief Complaint:   OBESITY Laurie Sutton is here to discuss her progress with her obesity treatment plan along with follow-up of her obesity related diagnoses. Laurie Sutton is on the Category 2 Plan and states she is following her eating plan approximately 25% of the time. Laurie Sutton states she is walking 10 minutes 2 times per week.  Today's visit was #: 9 Starting weight: 230 lbs Starting date: 06/09/2021 Today's weight: 231 lbs Today's date: 11/11/2021 Total lbs lost to date: 0 lbs Total lbs lost since last in-office visit: 0  Interim History: Laurie Sutton is upset with weight gain. She has gotten off track due to summer break. She has been sleeping in and has not felt well while taking Ozempic. Notes side effects of nausea, constipation and reflux. It has resolved since stopping Metformin and Ozempic. Overall she is feeling better and more like herself. She has started walking last week . Starts back to work Aug 21st.  Subjective:   1. Type 2 diabetes mellitus with other specified complication, with long-term current use of insulin (HCC) Laurie Sutton saw [name] last on 10/16/21. She stopped Ozempic and Metformin due to side effects. She is taking Lantus 165 units, SS Humalog. Last A1c was 8. She is not on a statin, ACE or ARB. Last eye exam was 2023. Fasting blood sugars:298, noon:292, later:296.  2. Drug-induced constipation This has resolved since stopping Ozempic.  Assessment/Plan:   1. Type 2 diabetes mellitus with other specified complication, with long-term current use of insulin (HCC) Laurie Sutton will continue to follow up with [name]. Will monitor blood sugars closely on Low Carb Plan.  Good blood sugar control is important to decrease the likelihood of diabetic complications such as nephropathy, neuropathy, limb loss, blindness, coronary artery disease, and death. Intensive lifestyle modification including diet, exercise and weight loss are the first line of treatment for diabetes.    2.  Drug-induced constipation Laurie Sutton will continue to monitor.  3. Obesity with current BMI of 37.4 Laurie Sutton is currently in the action stage of change. As such, her goal is to continue with weight loss efforts. She has agreed to following a lower carbohydrate, vegetable and lean protein rich diet plan.   Exercise goals: All adults should avoid inactivity. Some physical activity is better than none, and adults who participate in any amount of physical activity gain some health benefits.  Behavioral modification strategies: increasing lean protein intake, increasing vegetables, and increasing water intake.  Laurie Sutton has agreed to follow-up with our clinic in 2 weeks. She was informed of the importance of frequent follow-up visits to maximize her success with intensive lifestyle modifications for her multiple health conditions.   Objective:   Blood pressure 112/69, pulse 76, temperature 98.2 F (36.8 C), height 5' 6"  (1.676 m), weight 231 lb (104.8 kg), last menstrual period 11/13/2020, SpO2 97 %. Body mass index is 37.28 kg/m.  General: Cooperative, alert, well developed, in no acute distress. HEENT: Conjunctivae and lids unremarkable. Cardiovascular: Regular rhythm.  Lungs: Normal work of breathing. Neurologic: No focal deficits.   Lab Results  Component Value Date   CREATININE 0.42 (L) 10/29/2021   BUN 4 (L) 10/29/2021   NA 135 10/29/2021   K 3.9 10/29/2021   CL 100 10/29/2021   CO2 25 10/29/2021   Lab Results  Component Value Date   ALT 36 (H) 10/29/2021   AST 53 (H) 10/29/2021   ALKPHOS 107 06/08/2021   BILITOT 0.8 10/29/2021   Lab Results  Component Value Date  HGBA1C 9.6 04/27/2019   HGBA1C 6.2 08/19/2017   HGBA1C 6.9 (H) 06/07/2016   HGBA1C 7.5 (H) 12/30/2015   HGBA1C 6.0 08/21/2015   No results found for: "INSULIN" Lab Results  Component Value Date   TSH 1.170 06/09/2021   Lab Results  Component Value Date   CHOL 169 08/19/2017   HDL 38 08/19/2017   LDLCALC  91 08/19/2017   TRIG 202 (A) 08/19/2017   CHOLHDL 4 05/12/2015   Lab Results  Component Value Date   VD25OH 34 10/29/2021   VD25OH 12.8 (L) 06/09/2021   Lab Results  Component Value Date   WBC 2.8 (L) 10/29/2021   HGB 13.3 10/29/2021   HCT 40.5 10/29/2021   MCV 87.1 10/29/2021   PLT 83 (L) 10/29/2021   No results found for: "IRON", "TIBC", "FERRITIN"  Attestation Statements:   Reviewed by clinician on day of visit: allergies, medications, problem list, medical history, surgical history, family history, social history, and previous encounter notes.  Time spent on visit including pre-visit chart review and post-visit care and charting was 30 minutes.   I, Brendell Tyus, RMA, am acting as transcriptionist for Everardo Pacific, FNP.  I have reviewed the above documentation for accuracy and completeness, and I agree with the above. Everardo Pacific, FNP

## 2021-11-18 ENCOUNTER — Encounter (INDEPENDENT_AMBULATORY_CARE_PROVIDER_SITE_OTHER): Payer: Self-pay

## 2021-11-24 ENCOUNTER — Other Ambulatory Visit: Payer: Self-pay | Admitting: Family Medicine

## 2021-11-26 ENCOUNTER — Ambulatory Visit: Payer: Commercial Managed Care - PPO | Attending: Rheumatology | Admitting: Rheumatology

## 2021-11-26 ENCOUNTER — Encounter: Payer: Self-pay | Admitting: Rheumatology

## 2021-11-26 ENCOUNTER — Ambulatory Visit (INDEPENDENT_AMBULATORY_CARE_PROVIDER_SITE_OTHER): Payer: Commercial Managed Care - PPO | Admitting: Family Medicine

## 2021-11-26 VITALS — BP 114/72 | HR 67 | Resp 16 | Ht 66.0 in | Wt 229.4 lb

## 2021-11-26 DIAGNOSIS — E559 Vitamin D deficiency, unspecified: Secondary | ICD-10-CM

## 2021-11-26 DIAGNOSIS — N390 Urinary tract infection, site not specified: Secondary | ICD-10-CM

## 2021-11-26 DIAGNOSIS — D708 Other neutropenia: Secondary | ICD-10-CM

## 2021-11-26 DIAGNOSIS — K5909 Other constipation: Secondary | ICD-10-CM

## 2021-11-26 DIAGNOSIS — E119 Type 2 diabetes mellitus without complications: Secondary | ICD-10-CM

## 2021-11-26 DIAGNOSIS — Z6837 Body mass index (BMI) 37.0-37.9, adult: Secondary | ICD-10-CM

## 2021-11-26 DIAGNOSIS — K7460 Unspecified cirrhosis of liver: Secondary | ICD-10-CM

## 2021-11-26 DIAGNOSIS — D696 Thrombocytopenia, unspecified: Secondary | ICD-10-CM

## 2021-11-26 DIAGNOSIS — Z8261 Family history of arthritis: Secondary | ICD-10-CM

## 2021-11-26 DIAGNOSIS — F418 Other specified anxiety disorders: Secondary | ICD-10-CM

## 2021-11-26 DIAGNOSIS — Z794 Long term (current) use of insulin: Secondary | ICD-10-CM

## 2021-11-26 DIAGNOSIS — R768 Other specified abnormal immunological findings in serum: Secondary | ICD-10-CM | POA: Diagnosis not present

## 2021-11-26 DIAGNOSIS — Z8719 Personal history of other diseases of the digestive system: Secondary | ICD-10-CM

## 2021-11-26 DIAGNOSIS — K7581 Nonalcoholic steatohepatitis (NASH): Secondary | ICD-10-CM

## 2021-11-26 DIAGNOSIS — Z8669 Personal history of other diseases of the nervous system and sense organs: Secondary | ICD-10-CM

## 2021-11-26 DIAGNOSIS — K760 Fatty (change of) liver, not elsewhere classified: Secondary | ICD-10-CM

## 2021-11-26 DIAGNOSIS — D732 Chronic congestive splenomegaly: Secondary | ICD-10-CM

## 2021-12-09 ENCOUNTER — Ambulatory Visit (INDEPENDENT_AMBULATORY_CARE_PROVIDER_SITE_OTHER): Payer: Commercial Managed Care - PPO | Admitting: Nurse Practitioner

## 2021-12-10 ENCOUNTER — Other Ambulatory Visit: Payer: Self-pay | Admitting: Family Medicine

## 2021-12-11 NOTE — Telephone Encounter (Signed)
Dr. Barbie Banner patient  Last OV-06/05/21 Last refill- 04/20/21--60 tabs, 5 refill  No future OV scheduled.

## 2021-12-17 ENCOUNTER — Ambulatory Visit (INDEPENDENT_AMBULATORY_CARE_PROVIDER_SITE_OTHER): Payer: Commercial Managed Care - PPO | Admitting: Nurse Practitioner

## 2021-12-17 ENCOUNTER — Encounter (INDEPENDENT_AMBULATORY_CARE_PROVIDER_SITE_OTHER): Payer: Self-pay | Admitting: Nurse Practitioner

## 2021-12-17 VITALS — BP 107/68 | HR 79 | Temp 98.3°F | Ht 66.0 in | Wt 232.0 lb

## 2021-12-17 DIAGNOSIS — E669 Obesity, unspecified: Secondary | ICD-10-CM

## 2021-12-17 DIAGNOSIS — Z6837 Body mass index (BMI) 37.0-37.9, adult: Secondary | ICD-10-CM | POA: Diagnosis not present

## 2021-12-17 DIAGNOSIS — Z794 Long term (current) use of insulin: Secondary | ICD-10-CM

## 2021-12-17 DIAGNOSIS — E1169 Type 2 diabetes mellitus with other specified complication: Secondary | ICD-10-CM

## 2021-12-21 NOTE — Progress Notes (Unsigned)
Chief Complaint:   OBESITY Laurie Sutton is here to discuss her progress with her obesity treatment plan along with follow-up of her obesity related diagnoses. Laurie Sutton is on the Category 2 Plan and states she is following her eating plan approximately 50% of the time. Laurie Sutton states she is walking and 7 min workout video 30 minutes 3 times per week.  Today's visit was #: 10 Starting weight: 230 lbs Starting date: 06/09/2021 Today's weight: 232 lbs Today's date: 12/16/2021 Total lbs lost to date: 0 lbs Total lbs lost since last in-office visit: 0  Interim History: Kemyra started back to school since her last visit. Frustrated with maintaining current weight. Her weakness is cookies. She bakes them fresh at work. Stopped buying ice cream at home. She started a new workout this week. Aiming to eat more protein and salads.  Subjective:   1. Type 2 diabetes mellitus with other specified complication, with long-term current use of insulin (HCC) Laurie Sutton is taking Lantus 160 units and SS Humalog. Took metformin and Ozempic in the past. Stopped both due to side effects. Fasting blood sugars= 125-300. Lowest blood sugar 80.  Assessment/Plan:   1. Type 2 diabetes mellitus with other specified complication, with long-term current use of insulin (HCC) Continue to follow up with Dr. Jearld Fenton call and schedule appointment.  2. Obesity with current BMI of 37.5 Laurie Sutton is currently in the action stage of change. As such, her goal is to continue with weight loss efforts. She has agreed to following a lower carbohydrate, vegetable and lean protein rich diet plan.   Exercise goals: As is.  Laurie Sutton is to track using Lose it or Myfitness pal and will review at next visit.  Behavioral modification strategies: increasing lean protein intake, increasing vegetables, increasing water intake, and keeping a strict food journal.  Laurie Sutton has agreed to follow-up with our clinic in 2 weeks. She was informed of the  importance of frequent follow-up visits to maximize her success with intensive lifestyle modifications for her multiple health conditions.   Objective:   Blood pressure 107/68, pulse 79, temperature 98.3 F (36.8 C), height 5' 6"  (1.676 m), weight 232 lb (105.2 kg), last menstrual period 11/13/2020, SpO2 96 %. Body mass index is 37.45 kg/m.  General: Cooperative, alert, well developed, in no acute distress. HEENT: Conjunctivae and lids unremarkable. Cardiovascular: Regular rhythm.  Lungs: Normal work of breathing. Neurologic: No focal deficits.   Lab Results  Component Value Date   CREATININE 0.42 (L) 10/29/2021   BUN 4 (L) 10/29/2021   NA 135 10/29/2021   K 3.9 10/29/2021   CL 100 10/29/2021   CO2 25 10/29/2021   Lab Results  Component Value Date   ALT 36 (H) 10/29/2021   AST 53 (H) 10/29/2021   ALKPHOS 107 06/08/2021   BILITOT 0.8 10/29/2021   Lab Results  Component Value Date   HGBA1C 9.6 04/27/2019   HGBA1C 6.2 08/19/2017   HGBA1C 6.9 (H) 06/07/2016   HGBA1C 7.5 (H) 12/30/2015   HGBA1C 6.0 08/21/2015   No results found for: "INSULIN" Lab Results  Component Value Date   TSH 1.170 06/09/2021   Lab Results  Component Value Date   CHOL 169 08/19/2017   HDL 38 08/19/2017   LDLCALC 91 08/19/2017   TRIG 202 (A) 08/19/2017   CHOLHDL 4 05/12/2015   Lab Results  Component Value Date   VD25OH 34 10/29/2021   VD25OH 12.8 (L) 06/09/2021   Lab Results  Component Value Date  WBC 2.8 (L) 10/29/2021   HGB 13.3 10/29/2021   HCT 40.5 10/29/2021   MCV 87.1 10/29/2021   PLT 83 (L) 10/29/2021   No results found for: "IRON", "TIBC", "FERRITIN"  Attestation Statements:   Reviewed by clinician on day of visit: allergies, medications, problem list, medical history, surgical history, family history, social history, and previous encounter notes.  Time spent on visit including pre-visit chart review and post-visit care and charting was 30 minutes.   I, Brendell Tyus,  RMA, am acting as transcriptionist for Everardo Pacific, FNP.  I have reviewed the above documentation for accuracy and completeness, and I agree with the above. -  ***

## 2021-12-23 ENCOUNTER — Other Ambulatory Visit (INDEPENDENT_AMBULATORY_CARE_PROVIDER_SITE_OTHER): Payer: Self-pay | Admitting: Nurse Practitioner

## 2021-12-23 ENCOUNTER — Other Ambulatory Visit: Payer: Self-pay | Admitting: Neurology

## 2021-12-23 ENCOUNTER — Other Ambulatory Visit: Payer: Self-pay | Admitting: Family Medicine

## 2021-12-23 DIAGNOSIS — J309 Allergic rhinitis, unspecified: Secondary | ICD-10-CM

## 2021-12-23 DIAGNOSIS — E559 Vitamin D deficiency, unspecified: Secondary | ICD-10-CM

## 2021-12-29 ENCOUNTER — Ambulatory Visit (INDEPENDENT_AMBULATORY_CARE_PROVIDER_SITE_OTHER): Payer: Commercial Managed Care - PPO | Admitting: Nurse Practitioner

## 2021-12-29 ENCOUNTER — Encounter (INDEPENDENT_AMBULATORY_CARE_PROVIDER_SITE_OTHER): Payer: Self-pay | Admitting: Nurse Practitioner

## 2021-12-29 VITALS — BP 107/70 | HR 80 | Temp 98.8°F | Ht 66.0 in | Wt 234.0 lb

## 2021-12-29 DIAGNOSIS — Z6837 Body mass index (BMI) 37.0-37.9, adult: Secondary | ICD-10-CM

## 2021-12-29 DIAGNOSIS — E559 Vitamin D deficiency, unspecified: Secondary | ICD-10-CM | POA: Diagnosis not present

## 2021-12-29 DIAGNOSIS — E669 Obesity, unspecified: Secondary | ICD-10-CM | POA: Diagnosis not present

## 2021-12-29 MED ORDER — VITAMIN D (ERGOCALCIFEROL) 1.25 MG (50000 UNIT) PO CAPS: 50000.0000 [IU] | ORAL_CAPSULE | ORAL | 0 refills | Status: DC

## 2021-12-31 NOTE — Progress Notes (Signed)
Chief Complaint:   OBESITY Laurie Sutton is here to discuss her progress with her obesity treatment plan along with follow-up of her obesity related diagnoses. Laurie Sutton is on following a lower carbohydrate, vegetable and lean protein rich diet plan and states she is following her eating plan approximately 75% of the time. Laurie Sutton states she is doing workouts (Facebook) 7 minutes 7 times per week.  Today's visit was #: 11 Starting weight: 230 lbs Starting date: 06/09/2021 Today's weight: 234 lbs Today's date: 12/29/2021 Total lbs lost to date: 0 lbs Total lbs lost since last in-office visit: 0  Interim History: Laurie Sutton is frustrated with weight gain. She brought her food log for me to review today. Eating sweets, carbs and sugar daily with very limited protein. Drinking water and diet sodas.  Subjective:   1. Vitamin D deficiency Laurie Sutton is currently taking prescription Vit D 50,000 IU once a week.   Assessment/Plan:   1. Vitamin D deficiency We will refill Vit D 50,000 IU once a week for 1 month with 0 refills. Low Vitamin D level contributes to fatigue and are associated with obesity, breast, and colon cancer. She agrees to continue to take prescription Vitamin D @50 ,000 IU every week and will follow-up for routine testing of Vitamin D, at least 2-3 times per year to avoid over-replacement.   -Refill Vitamin D, Ergocalciferol, (DRISDOL) 1.25 MG (50000 UNIT) CAPS capsule; Take 1 capsule (50,000 Units total) by mouth every 7 (seven) days.  Dispense: 4 capsule; Refill: 0  2. Current BMI 37.8 Laurie Sutton is currently in the action stage of change. As such, her goal is to continue with weight loss efforts. She has agreed to keeping a food journal and adhering to recommended goals of 1800 calories and 80+ grams of protein.   Exercise goals: As is.  Multiple handouts given today. Discuss the importance of meeting protein goals and decreasing carbs/sweets intake.  Behavioral modification strategies:  increasing lean protein intake, increasing vegetables, and increasing water intake.  Laurie Sutton has agreed to follow-up with our clinic in 3 weeks. She was informed of the importance of frequent follow-up visits to maximize her success with intensive lifestyle modifications for her multiple health conditions.   Objective:   Blood pressure 107/70, pulse 80, temperature 98.8 F (37.1 C), height 5' 6"  (1.676 m), weight 234 lb (106.1 kg), last menstrual period 11/13/2020, SpO2 99 %. Body mass index is 37.77 kg/m.  General: Cooperative, alert, well developed, in no acute distress. HEENT: Conjunctivae and lids unremarkable. Cardiovascular: Regular rhythm.  Lungs: Normal work of breathing. Neurologic: No focal deficits.   Lab Results  Component Value Date   CREATININE 0.42 (L) 10/29/2021   BUN 4 (L) 10/29/2021   NA 135 10/29/2021   K 3.9 10/29/2021   CL 100 10/29/2021   CO2 25 10/29/2021   Lab Results  Component Value Date   ALT 36 (H) 10/29/2021   AST 53 (H) 10/29/2021   ALKPHOS 107 06/08/2021   BILITOT 0.8 10/29/2021   Lab Results  Component Value Date   HGBA1C 9.6 04/27/2019   HGBA1C 6.2 08/19/2017   HGBA1C 6.9 (H) 06/07/2016   HGBA1C 7.5 (H) 12/30/2015   HGBA1C 6.0 08/21/2015   No results found for: "INSULIN" Lab Results  Component Value Date   TSH 1.170 06/09/2021   Lab Results  Component Value Date   CHOL 169 08/19/2017   HDL 38 08/19/2017   LDLCALC 91 08/19/2017   TRIG 202 (A) 08/19/2017   CHOLHDL 4  05/12/2015   Lab Results  Component Value Date   VD25OH 34 10/29/2021   VD25OH 12.8 (L) 06/09/2021   Lab Results  Component Value Date   WBC 2.8 (L) 10/29/2021   HGB 13.3 10/29/2021   HCT 40.5 10/29/2021   MCV 87.1 10/29/2021   PLT 83 (L) 10/29/2021   No results found for: "IRON", "TIBC", "FERRITIN"  Attestation Statements:   Reviewed by clinician on day of visit: allergies, medications, problem list, medical history, surgical history, family history,  social history, and previous encounter notes.  I, Brendell Tyus, RMA, am acting as transcriptionist for Everardo Pacific, FNP.  I have reviewed the above documentation for accuracy and completeness, and I agree with the above. Everardo Pacific, FNP

## 2022-01-04 ENCOUNTER — Encounter: Payer: Self-pay | Admitting: Family Medicine

## 2022-01-04 MED ORDER — FLUCONAZOLE 150 MG PO TABS: 150.0000 mg | ORAL_TABLET | Freq: Every day | ORAL | 5 refills | Status: DC | PRN

## 2022-01-04 MED ORDER — DOXYCYCLINE HYCLATE 100 MG PO CAPS: 100.0000 mg | ORAL_CAPSULE | Freq: Two times a day (BID) | ORAL | 0 refills | Status: DC

## 2022-01-04 NOTE — Telephone Encounter (Signed)
I sent in for a 30 day supply of Doxycline for the boils as well as some Diflucan for possible yeast infections

## 2022-01-04 NOTE — Telephone Encounter (Signed)
Pt checking progress of this request.

## 2022-01-12 ENCOUNTER — Encounter: Payer: Self-pay | Admitting: Family Medicine

## 2022-01-12 ENCOUNTER — Ambulatory Visit: Payer: Commercial Managed Care - PPO | Admitting: Family Medicine

## 2022-01-12 VITALS — BP 110/64 | HR 74 | Temp 98.6°F | Wt 239.0 lb

## 2022-01-12 DIAGNOSIS — L732 Hidradenitis suppurativa: Secondary | ICD-10-CM | POA: Diagnosis not present

## 2022-01-12 MED ORDER — CLINDAMYCIN HCL 300 MG PO CAPS: 300.0000 mg | ORAL_CAPSULE | Freq: Three times a day (TID) | ORAL | 0 refills | Status: DC

## 2022-01-12 NOTE — Progress Notes (Signed)
   Subjective:    Patient ID: Laurie Sutton, female    DOB: November 09, 1980, 41 y.o.   MRN: [id number]  HPI Here to follow up on painful boils in both armpits. She has been taking Doxycycline for some time for this. It used to be very helpful, but lately it has little effect. No fevers.    Review of Systems  Constitutional: Negative.   Respiratory: Negative.    Cardiovascular: Negative.   Skin:  Positive for wound.       Objective:   Physical Exam Constitutional:      Appearance: She is obese.  Cardiovascular:     Rate and Rhythm: Normal rate and regular rhythm.     Pulses: Normal pulses.     Heart sounds: Normal heart sounds.  Pulmonary:     Effort: Pulmonary effort is normal.     Breath sounds: Normal breath sounds.  Skin:    Comments: Each axilla has multiple tender boils beneath the surface   Neurological:     Mental Status: She is alert.           Assessment & Plan:  Hydradenitis, we will stop the Doxycycline and try 10 days of Clindamycin 300 mg TID. She will report back after this is finished.  Alysia Penna, MD

## 2022-01-21 ENCOUNTER — Ambulatory Visit: Payer: Commercial Managed Care - PPO | Admitting: Nurse Practitioner

## 2022-02-04 ENCOUNTER — Other Ambulatory Visit: Payer: Self-pay | Admitting: Internal Medicine

## 2022-02-08 ENCOUNTER — Other Ambulatory Visit: Payer: Self-pay | Admitting: Family Medicine

## 2022-02-08 NOTE — Telephone Encounter (Signed)
Last refill-12-11-21--60 tabs, 0 refills Last OV- 01/12/22  No future Ov scheduled

## 2022-02-09 MED ORDER — ALPRAZOLAM 1 MG PO TABS: 1.0000 mg | ORAL_TABLET | Freq: Two times a day (BID) | ORAL | 5 refills | Status: DC | PRN

## 2022-02-18 ENCOUNTER — Encounter: Payer: Self-pay | Admitting: Nurse Practitioner

## 2022-02-18 ENCOUNTER — Ambulatory Visit (INDEPENDENT_AMBULATORY_CARE_PROVIDER_SITE_OTHER): Payer: Commercial Managed Care - PPO | Admitting: Nurse Practitioner

## 2022-02-18 VITALS — BP 111/69 | HR 77 | Temp 97.8°F | Ht 66.0 in | Wt 236.0 lb

## 2022-02-18 DIAGNOSIS — E559 Vitamin D deficiency, unspecified: Secondary | ICD-10-CM | POA: Diagnosis not present

## 2022-02-18 DIAGNOSIS — E1169 Type 2 diabetes mellitus with other specified complication: Secondary | ICD-10-CM | POA: Diagnosis not present

## 2022-02-18 DIAGNOSIS — E669 Obesity, unspecified: Secondary | ICD-10-CM | POA: Diagnosis not present

## 2022-02-18 DIAGNOSIS — Z794 Long term (current) use of insulin: Secondary | ICD-10-CM

## 2022-02-18 DIAGNOSIS — Z6838 Body mass index (BMI) 38.0-38.9, adult: Secondary | ICD-10-CM | POA: Diagnosis not present

## 2022-02-18 MED ORDER — TIRZEPATIDE 2.5 MG/0.5ML ~~LOC~~ SOAJ: 2.5000 mg | SUBCUTANEOUS | 0 refills | Status: DC

## 2022-02-18 MED ORDER — VITAMIN D (ERGOCALCIFEROL) 1.25 MG (50000 UNIT) PO CAPS: 50000.0000 [IU] | ORAL_CAPSULE | ORAL | 0 refills | Status: DC

## 2022-02-18 NOTE — Patient Instructions (Signed)
What is a GLP-1 Glucagon like peptide-1 (GLP-1) agonists represent a class of medications used to treat type 2 diabetes mellitus and obesity.  GLP-1 medications mimic the action of a hormone called glucagon like peptide 1.  When blood sugar levels start to rise/increase these drugs stimulate the body to produce more insulin.  When that happens, the extra insulin helps to lower the blood sugar levels in the body.  This in returns helps with decreasing cravings.  These medications also slow the movement of food from the stomach into the small intestine.  This in return helps one to full faster and longer.   Diabetic medications: Approved for treatment of diabetes mellitus but does not have full approval for weight loss use Victoza (liraglutide) Ozempic (semaglutide) Mounjaro Trulicity Rybelsus  Weight loss medications: Approved for long-term weight loss use.        Saxenda (liraglutide) Wegovy (semaglutide)  Contraindications:  Pancreatitis (active gallstones) Medullary thyroid cancer High triglycerides (>500)-will need labs prior to starting Multiple Endocrine Neoplasia syndrome type 2 (MEN 2) Trying to get pregnant Breastfeeding Use with caution with taking insulin or sulfonylureas (will need to monitor blood sugars for hypoglycemia) Side effects (most common): Most common side effects are nausea, gas, bloating and constipation.  Other possible side effects are headaches, belching, diarrhea, tiredness (fatigue), vomiting, upset stomach, dizziness, heartburn and stomach (abdominal pain).  If you think that you are becoming dehydrated, please inform our office or your primary family provider.  Stop immediately and go to ER if you have any symptoms of a serious allergic reaction including swelling of your face, lips, tongue or throat; problems breathing or swallowing; severe rash or itching; fainting or feeling dizzy; or very rapid heart rate.

## 2022-02-22 ENCOUNTER — Other Ambulatory Visit: Payer: Self-pay | Admitting: Family Medicine

## 2022-02-25 NOTE — Progress Notes (Signed)
Chief Complaint:   OBESITY Laurie Sutton is here to discuss her progress with her obesity treatment plan along with follow-up of her obesity related diagnoses. Laurie Sutton is on keeping a food journal and adhering to recommended goals of 1800 calories and 80 grams of protein and states she is following her eating plan approximately 50% of the time. Laurie Sutton states she is exercising 0 minutes 0 times per week.  Today's visit was #: 12 Starting weight: 230 lbs Starting date: 06/09/2021 Today's weight: 236 lbs Today's date: 02/18/2022 Total lbs lost to date: 0 lbs Total lbs lost since last in-office visit: 0  Interim History: Laurie Sutton was seen here last on 12/23/21. Her kids have been sick since her last visit. She has gotten of track and has struggled to get back on track. Recently struggling with sugar and cravings. Breakfast= protein waffles. Lunch= wings, pickles, apple. Snack=cookies, sweets and candy. Dinner varies, struggles between 1:30-7:30pm. Has several questions about bariatric surgery  Subjective:   1. Type 2 diabetes mellitus with other specified complication, with long-term current use of insulin ([place]) Laurie Sutton saw Endo last in Sept. Notes her A1c was 11+. She is taking Humulin R and stopped her Humalog. She has taken Trulicity, Ozempic, [name], Victoza and Metformin, stopped Metformin due to side effects.   2. Vitamin D deficiency Laurie Sutton is currently taking prescription Vit D 50,000 IU once a week. Denies any nausea, vomiting or muscle weakness.  Assessment/Plan:   1. Type 2 diabetes mellitus with other specified complication, with long-term current use of insulin (HCC)  Start  Mounjaro 2.5 SQ once a week for 1 month with 0 refills. Continue to follow up with Endo. Side effects discussed.   Contraindications: (patient denies:  information given)  Pancreatitis (active gallstones) Medullary thyroid cancer High triglycerides (>500)-will need labs prior to starting Multiple Endocrine  Neoplasia syndrome type 2 (MEN 2) Trying to get pregnant Breastfeeding Use with caution with taking insulin or sulfonylureas (will need to monitor blood sugars for hypoglycemia)  -Start tirzepatide [facility]) 2.5 MG/0.5ML Pen; Inject 2.5 mg into the skin once a week.  Dispense: 2 mL; Refill: 0  2. Vitamin D deficiency We will refill Vit D 50,000 IU once a week for 1 month with 0 refills.  Low Vitamin D level contributes to fatigue and are associated with obesity, breast, and colon cancer. She agrees to continue to take prescription Vitamin D @50 ,000 IU every week and will follow-up for routine testing of Vitamin D, at least 2-3 times per year to avoid over-replacement.   -Refill Vitamin D, Ergocalciferol, (DRISDOL) 1.25 MG (50000 UNIT) CAPS capsule; Take 1 capsule (50,000 Units total) by mouth every 7 (seven) days.  Dispense: 4 capsule; Refill: 0  3. Obesity Current BMI 38.1 Laurie Sutton is currently in the action stage of change. As such, her goal is to continue with weight loss efforts. She has agreed to keeping a food journal and adhering to recommended goals of 1800 calories and 180+ grams of protein.   Exercise goals: All adults should avoid inactivity. Some physical activity is better than none, and adults who participate in any amount of physical activity gain some health benefits.  Behavioral modification strategies: increasing lean protein intake, increasing water intake, and holiday eating strategies .  Laurie Sutton has agreed to follow-up with our clinic in 3 weeks. She was informed of the importance of frequent follow-up visits to maximize her success with intensive lifestyle modifications for her multiple health conditions.   Objective:   Blood  pressure 111/69, pulse 77, temperature 97.8 F (36.6 C), temperature source Oral, height 5' 6"  (1.676 m), weight 236 lb (107 kg), last menstrual period 11/13/2020, SpO2 98 %. Body mass index is 38.09 kg/m.  General: Cooperative, alert, well  developed, in no acute distress. HEENT: Conjunctivae and lids unremarkable. Cardiovascular: Regular rhythm.  Lungs: Normal work of breathing. Neurologic: No focal deficits.   Lab Results  Component Value Date   CREATININE 0.42 (L) 10/29/2021   BUN 4 (L) 10/29/2021   NA 135 10/29/2021   K 3.9 10/29/2021   CL 100 10/29/2021   CO2 25 10/29/2021   Lab Results  Component Value Date   ALT 36 (H) 10/29/2021   AST 53 (H) 10/29/2021   ALKPHOS 107 06/08/2021   BILITOT 0.8 10/29/2021   Lab Results  Component Value Date   HGBA1C 9.6 04/27/2019   HGBA1C 6.2 08/19/2017   HGBA1C 6.9 (H) 06/07/2016   HGBA1C 7.5 (H) 12/30/2015   HGBA1C 6.0 08/21/2015   No results found for: "INSULIN" Lab Results  Component Value Date   TSH 1.170 06/09/2021   Lab Results  Component Value Date   CHOL 169 08/19/2017   HDL 38 08/19/2017   LDLCALC 91 08/19/2017   TRIG 202 (A) 08/19/2017   CHOLHDL 4 05/12/2015   Lab Results  Component Value Date   VD25OH 34 10/29/2021   VD25OH 12.8 (L) 06/09/2021   Lab Results  Component Value Date   WBC 2.8 (L) 10/29/2021   HGB 13.3 10/29/2021   HCT 40.5 10/29/2021   MCV 87.1 10/29/2021   PLT 83 (L) 10/29/2021   No results found for: "IRON", "TIBC", "FERRITIN"  Attestation Statements:   Reviewed by clinician on day of visit: allergies, medications, problem list, medical history, surgical history, family history, social history, and previous encounter notes.  I, Brendell Tyus, RMA, am acting as transcriptionist for Everardo Pacific, FNP.  I have reviewed the above documentation for accuracy and completeness, and I agree with the above. Everardo Pacific, FNP

## 2022-03-09 ENCOUNTER — Encounter: Payer: Self-pay | Admitting: Nurse Practitioner

## 2022-03-09 ENCOUNTER — Ambulatory Visit: Payer: Commercial Managed Care - PPO | Admitting: Nurse Practitioner

## 2022-03-09 VITALS — BP 117/69 | HR 77 | Temp 98.2°F | Ht 66.0 in | Wt 232.0 lb

## 2022-03-09 DIAGNOSIS — E1169 Type 2 diabetes mellitus with other specified complication: Secondary | ICD-10-CM | POA: Diagnosis not present

## 2022-03-09 DIAGNOSIS — E669 Obesity, unspecified: Secondary | ICD-10-CM | POA: Diagnosis not present

## 2022-03-09 DIAGNOSIS — E559 Vitamin D deficiency, unspecified: Secondary | ICD-10-CM

## 2022-03-09 DIAGNOSIS — R110 Nausea: Secondary | ICD-10-CM | POA: Diagnosis not present

## 2022-03-09 DIAGNOSIS — Z6837 Body mass index (BMI) 37.0-37.9, adult: Secondary | ICD-10-CM

## 2022-03-09 DIAGNOSIS — Z794 Long term (current) use of insulin: Secondary | ICD-10-CM

## 2022-03-09 DIAGNOSIS — Z7985 Long-term (current) use of injectable non-insulin antidiabetic drugs: Secondary | ICD-10-CM

## 2022-03-09 MED ORDER — ONDANSETRON 4 MG PO TBDP: 4.0000 mg | ORAL_TABLET | Freq: Three times a day (TID) | ORAL | 0 refills | Status: DC | PRN

## 2022-03-09 MED ORDER — TIRZEPATIDE 2.5 MG/0.5ML ~~LOC~~ SOAJ: 2.5000 mg | SUBCUTANEOUS | 0 refills | Status: DC

## 2022-03-09 MED ORDER — VITAMIN D (ERGOCALCIFEROL) 1.25 MG (50000 UNIT) PO CAPS: 50000.0000 [IU] | ORAL_CAPSULE | ORAL | 0 refills | Status: DC

## 2022-03-09 NOTE — Patient Instructions (Signed)
Protein Shakes  What is a protein shake? Protein shakes are usually made with a protein source (protein powder, yogurt, etc.) frozen fruit, milk, water and/or ice.  A protein shake can be a healthy way to get your protein if you miss a meal.    When to drink a protein shake? Protein shakes can be beneficial if you decide to not eat a meal or are unable to eat for whatever reason.  What are the benefits of a protein shake? Protein shakes are an excellent way to get an adequate protein for anyone but especially for persons who are vegetarians or vegan.  Additionally, protein shakes are great to drink after a workout for muscle recovery.  What are the best protein powders?         Whey protein Grow Whey Protein Isolate [employer] Whey Protein X Werks Grow Whey Protein Isolate  Plant Based [employer] Labs Rice and Pea Powder  However just basic protein powder, either whey or plant based, is fine and it's usually less expensive.  Many protein powders can be bought locally at [place] or another grocery store.   Prepared Protein Shakes Atkins Milk Chocolate Delight  Cal 160  Total fat 9gm  Total carb 7gm  Protein 15gm Ensure Original Vanilla  Cal 220  Total fat 6gm  Total carb 32gm  Protein 9gm [employer] Signature Complete Nutrition Shake Cal 200 Total fat 6gm Total carb 22gm Protein 15g Orgain Sweet Vanilla Bean Cal 250  Total fat 7gm  Total carb 32gm  Protein 16gm Premier Protein Chocolate  Cal 160  Total fat 3gm  Total carb 5gm  Protein 30gm Premier Protein Vanilla  Cal 160  Total fat 3gm  Total carb 5gm  Protein 30gm Quest Protein Shake Chocolate  Cal 160  Total fat 3.5gm  Total carb 4gm  Protein 30gm  Quest Protein Shake Vanilla  Cal 160  Total fat 3gm  Total carb 3gm  Protein 30gm  Lactose free options Muscle Milk Genuine Chocolate  Cal 160  Total fat 5gm  Total carb 9gm  Protein 25gm Muscle Milk Genuine Vanilla   Cal 160  Total fat 5gm  Total carb 9gm  Protein 25gm Fairlife Chocolate  Cal 150-250  Total fat 2.5-8gm  Total carb 4-22gm  Protein 23-30gm    Fairlife Vanilla  Cal 150  Total fat 2.5gm  Total carb 3gm  Protein 30gm    Core Power  Chocolate or Vanilla   Cal 170  Total fat 4.5gm  Total carb 8gm  Protein 26/41gm   Ideas for Protein Shakes   High protein vegetables:  Spinach or Kale- 1/4-1/2 cup Fruit:  Fresh or frozen fruit- 1/2 cup  Protein Sources:   Greek yogurt Cottage cheese Fairlife milk Kefir  Nuts: Almonds- 2 Tbsp Peanuts- 2 Tbsp Cashews- 2 Tbsp Nut butter- 1 Tbsp  Seeds: Chia seeds- 1 Tbsp Flax seeds- 1 Tbsp Hemp seeds- 1 Tbsp   Basic Protein Shake Recipe  1 cup of frozen fruit 1/2 - 1 cup (Fairlife milk, yogurt or unsweetened almond milk) 1 scoop of protein powder (20-30 grams of protein per scoop) Optional:  artificial sweeteners (to taste), ice, water, and other ingredients listed on this sheet.  Breakfast Protein Shake  1 and 1/2 cups Fairlife Skim Milk 1 and 1/2 cups Ice 1 Greek yogurt cup 1/2 cup frozen berries 4-5 packets Splenda (to your taste)  This shake will fit in a 40 oz cup and  is around 300 calories with 32 grams of protein.    Enjoy!

## 2022-03-16 NOTE — Progress Notes (Unsigned)
Chief Complaint:   OBESITY Laurie Sutton is here to discuss her progress with her obesity treatment plan along with follow-up of her obesity related diagnoses. Laurie Sutton is on following a lower carbohydrate, vegetable and lean protein rich diet plan and states she is following her eating plan approximately 70% of the time. Laurie Sutton states she has been more active at work.  Today's visit was #: 13 Starting weight: 230 lbs Starting date: 06/09/2021 Today's weight: 232 lbs Today's date: 03/09/2022 Total lbs lost to date: 0 lbs Total lbs lost since last in-office visit: 4  Interim History: [name] has done well with weight loss since her last visit. Struggling with eating all her food on meal plan since starting Mounjaro. Craving sweets. Drinking water and diet soda.  Subjective:   1. Type 2 diabetes mellitus with other specified complication, with long-term current use of insulin (HCC) Taking Mounjaro 2.5 mg. Reports side effects of nausea and decreased appetite. Craving sweets. Seeing a new Endo tomorrow.   2. Vitamin D deficiency Laurie Sutton is currently taking prescription Vit D 50,000 IU once a week. Denies any nausea, vomiting or muscle weakness.  3. Nausea Taking Zofran as needed. Denies any side effects.  Assessment/Plan:   1. Type 2 diabetes mellitus with other specified complication, with long-term current use of insulin (HCC) We will refill Mounjaro 2.5 mg SQ once weekly for 1 month with 0 refill. Will discuss with Endo tomorrow.  Good blood sugar control is important to decrease the likelihood of diabetic complications such as nephropathy, neuropathy, limb loss, blindness, coronary artery disease, and death. Intensive lifestyle modification including diet, exercise and weight loss are the first line of treatment for diabetes.    -Refill tirzepatide (MOUNJARO) 2.5 MG/0.5ML Pen; Inject 2.5 mg into the skin once a week.  Dispense: 2 mL; Refill: 0  2. Vitamin D deficiency We will refill  Vit D 50,000 IU once weekly for 1 month with 0 refills.Will ask Endo to add Vit D labs tomorrow.  Low Vitamin D level contributes to fatigue and are associated with obesity, breast, and colon cancer. She agrees to continue to take prescription Vitamin D @50 ,000 IU every week and will follow-up for routine testing of Vitamin D, at least 2-3 times per year to avoid over-replacement.   -Refill Vitamin D, Ergocalciferol, (DRISDOL) 1.25 MG (50000 UNIT) CAPS capsule; Take 1 capsule (50,000 Units total) by mouth every 7 (seven) days.  Dispense: 4 capsule; Refill: 0  3. Nausea We will refill Zofran 4 mg as needed for 1 month with 0 refills.  -Refill ondansetron (ZOFRAN-ODT) 4 MG disintegrating tablet; Take 1 tablet (4 mg total) by mouth every 8 (eight) hours as needed for nausea or vomiting.  Dispense: 20 tablet; Refill: 0  4. Obesity with current BMI of 37.6 Will refer to bariatric General Surgery.  - Ambulatory referral to [place] is currently in the action stage of change. As such, her goal is to continue with weight loss efforts. She has agreed to following a lower carbohydrate, vegetable and lean protein rich diet plan.   Exercise goals: All adults should avoid inactivity. Some physical activity is better than none, and adults who participate in any amount of physical activity gain some health benefits.  Behavioral modification strategies: increasing lean protein intake, increasing water intake, and holiday eating strategies .  Laurie Sutton has agreed to follow-up with our clinic in 2 weeks. She was informed of the importance of frequent follow-up visits to maximize her success  with intensive lifestyle modifications for her multiple health conditions.   Objective:   Blood pressure 117/69, pulse 77, temperature 98.2 F (36.8 C), temperature source Oral, height 5' 6"  (1.676 m), weight 232 lb (105.2 kg), last menstrual period 11/13/2020, SpO2 97 %. Body mass index is 37.45  kg/m.  General: Cooperative, alert, well developed, in no acute distress. HEENT: Conjunctivae and lids unremarkable. Cardiovascular: Regular rhythm.  Lungs: Normal work of breathing. Neurologic: No focal deficits.   Lab Results  Component Value Date   CREATININE 0.42 (L) 10/29/2021   BUN 4 (L) 10/29/2021   NA 135 10/29/2021   K 3.9 10/29/2021   CL 100 10/29/2021   CO2 25 10/29/2021   Lab Results  Component Value Date   ALT 36 (H) 10/29/2021   AST 53 (H) 10/29/2021   ALKPHOS 107 06/08/2021   BILITOT 0.8 10/29/2021   Lab Results  Component Value Date   HGBA1C 9.6 04/27/2019   HGBA1C 6.2 08/19/2017   HGBA1C 6.9 (H) 06/07/2016   HGBA1C 7.5 (H) 12/30/2015   HGBA1C 6.0 08/21/2015   No results found for: "INSULIN" Lab Results  Component Value Date   TSH 1.170 06/09/2021   Lab Results  Component Value Date   CHOL 169 08/19/2017   HDL 38 08/19/2017   LDLCALC 91 08/19/2017   TRIG 202 (A) 08/19/2017   CHOLHDL 4 05/12/2015   Lab Results  Component Value Date   VD25OH 34 10/29/2021   VD25OH 12.8 (L) 06/09/2021   Lab Results  Component Value Date   WBC 2.8 (L) 10/29/2021   HGB 13.3 10/29/2021   HCT 40.5 10/29/2021   MCV 87.1 10/29/2021   PLT 83 (L) 10/29/2021   No results found for: "IRON", "TIBC", "FERRITIN"  Attestation Statements:   Reviewed by clinician on day of visit: allergies, medications, problem list, medical history, surgical history, family history, social history, and previous encounter notes.  I, Brendell Tyus, RMA, am acting as transcriptionist for Everardo Pacific, FNP.  I have reviewed the above documentation for accuracy and completeness, and I agree with the above. Everardo Pacific, FNP

## 2022-03-22 NOTE — Progress Notes (Unsigned)
[facility] Progress Note:  History: Laurie Sutton 03/23/2022  Referring provider: Laurey Morale, MD  Reason for consult/chief complaint: No chief complaint on file.   Subjective  HPI: New patient to me today while Dr. [name] is out on leave.  From his Feb 2023 office note:\ " Review of pertinent gastrointestinal problems: 1.  GERD without alarm symptoms evaluated 2016 Dr. [name].  She was drinking multiple (5-10) cans of caffeinated soda daily, also 10-15 peppermint candies daily.  I recommended lifestyle modifications and observation.  She was going to try to wean off of proton pump inhibitor over time as well. 2.  Upper abdominal pain led to EGD November 2021.  Mild distal gastritis was noted, biopsies were negative for H. Pylori. 3.  Likely fatty liver.  ?  Cirrhosis.  Ultrasound September 2016 showed "enlarged liver with increased echogenicity suggestive of steatosis.  Splenomegaly."  CT scan September 2016 showed "fatty infiltration of the liver". Ultrasound of liver with elastography June 2022 showed a median kPa of 4.9.  This falls into the diagnostic category of "high probability of being normal" CT scan abdomen pelvis with IV and oral contrast August 2022 indication evaluate for periumbilical hernia" suggested morphologic signs of cirrhosis. Lab work-up for chronic liver disease December 2022 : hepatitis A total antibody reactive, hepatitis C antibody negative, hepatitis B surface antigen negative, hepatitis B surface antibody negative, ceruloplasmin normal, anti-smooth muscle antibody IgG slightly elevated at 23, ANA positive titer 1:80, AMA negative, alpha-1 antitrypsin level normal, If cirrhosis, MELD 7 based on 12/22 labs: AST, ALT both slightly elevated, platelets 115   Also: "I do believe she probably has underlying cirrhosis, last MELD score based on blood work 2 or 3 months ago was low at 7. Her aunt died of cirrhosis from what sounds like fatty  liver disease as well. Her ANA and her AMA were both slightly elevated I am going to repeat those today. She also needs a repeat set of labs to restage her chronic liver disease including CBC, complete metabolic profile and coags. Her last liver imaging was in August 2022 and so I am ordering an ultrasound today for hepatoma screening. She needs an EGD to screen for varices as well. I am going to begin vaccinating her for hepatitis B"  She rec'd 2 HBV vaccines EGD 07/10/21 with distal esophageal varices, mild portal gastropathy. Bx neg for H pylori or IM Subsequent CTAP (for lower abdominal pain) report below - referred to gynecology.  ***   ROS:  Review of Systems   Past Medical History: Past Medical History:  Diagnosis Date   ADHD    Anemia    IRON TRANSFUSION 07-2020 NONE SINCE   Anxiety    Back pain    COVID 08/12/2019   ALL SYMPTOMS REOLVED PER PT   Depression    Diabetic neuropathy ([place]) 11/11/2020   FEET   dm type 2    sees [name]    Fatty liver    GERD (gastroesophageal reflux disease)    History of kidney stones    Joint pain    Menorrhagia 11/11/2020   Migraines    Murmur, cardiac    FAINT NO CARDIOLOGIST   Other fatigue    Pneumonia 08/12/2019   COVID PNEUMONIA ALL SYMPTOMS RESOLVED PER PT   Shortness of breath on exertion      Past Surgical History: Past Surgical History:  Procedure Laterality Date   CESAREAN SECTION  12/13/2006   EXTRACORPOREAL SHOCK  WAVE LITHOTRIPSY  2018   FOOT SURGERY Right 10/02/2020   RIGHT FOOT HEEL AND TOES, SUR[facility] OFF [address]   LAPAROSCOPIC VAGINAL HYSTERECTOMY WITH SALPINGECTOMY Bilateral 11/17/2020   Procedure: LAPAROSCOPIC ASSISTED VAGINAL HYSTERECTOMY WITH BILATERAL SALPINGECTOMY;  Surgeon: Linda Hedges, DO;  Location: House;  Service: Gynecology;  Laterality: Bilateral;     Family History: Family History  Problem Relation Age of Onset   Depression Mother    Anxiety disorder Mother     Sleep apnea Father    Anxiety disorder Father    Depression Father    Cancer Father    Diabetes Father    Bladder Cancer Father 57   Rheum arthritis Father    Migraines Maternal Aunt    Migraines Maternal Grandmother    Diabetes Maternal Grandfather    Healthy Daughter    Asthma Daughter    Anxiety disorder Daughter    Anxiety disorder Son    Asthma Son    Healthy Son    Cerebral aneurysm Cousin    Heart disease Other    Depression Other    Anxiety disorder Other    Sleep apnea Other    Colon cancer Neg Hx    Esophageal cancer Neg Hx    Stomach cancer Neg Hx    Rectal cancer Neg Hx     Social History: Social History   Socioeconomic History   Marital status: Married    Spouse name: Adam   Number of children: 2   Years of education: Not on file   Highest education level: 12th grade  Occupational History   Occupation: Haematologist and Nutrition    Employer: [employer]: works 20 hours per week  Tobacco Use   Smoking status: Never    Passive exposure: Current   Smokeless tobacco: Never  Vaping Use   Vaping Use: Never used  Substance and Sexual Activity   Alcohol use: No    Alcohol/week: 0.0 standard drinks of alcohol   Drug use: No   Sexual activity: Yes    Partners: Male    Birth control/protection: None  Other Topics Concern   Not on file  Social History Narrative   Not on file   Social Determinants of Health   Financial Resource Strain: Low Risk  (06/04/2021)   Overall Financial Resource Strain (CARDIA)    Difficulty of Paying Living Expenses: Not hard at all  Food Insecurity: No Food Insecurity (06/04/2021)   Hunger Vital Sign    Worried About Running Out of Food in the Last Year: Never true    Ran Out of Food in the Last Year: Never true  Transportation Needs: No Transportation Needs (06/04/2021)   PRAPARE - Transportation    Lack of Transportation (Medical): No    Lack of Transportation (Non-Medical): No  Physical Activity:  Insufficiently Active (06/04/2021)   Exercise Vital Sign    Days of Exercise per Week: 2 days    Minutes of Exercise per Session: 10 min  Stress: Stress Concern Present (06/04/2021)   [place]    Feeling of Stress : To some extent  Social Connections: Socially Integrated (06/04/2021)   Social Connection and Isolation Panel [NHANES]    Frequency of Communication with Friends and Family: More than three times a week    Frequency of Social Gatherings with Friends and Family: More than three times a week    Attends Religious Services: More than  4 times per year    Active Member of Clubs or Organizations: Yes    Attends Archivist Meetings: More than 4 times per year    Marital Status: Married    Allergies: Allergies  Allergen Reactions   Amoxicillin Hives and Itching   Azithromycin     DOES NOT WORK   Dulaglutide Other (See Comments)    JANUVIA STOAMCH HURT?   Januvia [Sitagliptin] Other (See Comments)    HURTS STOAMCH   Latex     IRRITATES VAGINAL AREA   Penicillins Hives    Has patient had a PCN reaction causing immediate rash, facial/tongue/throat swelling, SOB or lightheadedness with hypotension: yes. Rash  Has patient had a PCN reaction causing severe rash involving mucus membranes or skin necrosis: Yes- rash and hives all over body  Has patient had a PCN reaction that required hospitalization No Has patient had a PCN reaction occurring within the last 10 years: Yes  If all of the above answers are "NO", then may proceed with Cephalosporin use.     Outpatient Meds: Current Outpatient Medications  Medication Sig Dispense Refill   ALPRAZolam (XANAX) 1 MG tablet Take 1 tablet (1 mg total) by mouth 2 (two) times daily as needed for anxiety. 60 tablet 5   clindamycin (CLEOCIN) 300 MG capsule Take 1 capsule (300 mg total) by mouth 3 (three) times daily. 30 capsule 0   clotrimazole-betamethasone  (LOTRISONE) cream Apply 1 application topically 2 (two) times daily as needed. 30 g 5   Continuous Blood Gluc Sensor (FREESTYLE LIBRE 14 DAY SENSOR) MISC Apply topically every 14 (fourteen) days.     Cranberry 125 MG TABS Take by mouth 2 (two) times daily.     docusate sodium (COLACE) 100 MG capsule Take 1 capsule (100 mg total) by mouth 2 (two) times daily as needed for mild constipation. 20 capsule 0   fluconazole (DIFLUCAN) 150 MG tablet Take 1 tablet (150 mg total) by mouth daily as needed (yeast infection). 10 tablet 5   FLUoxetine (PROZAC) 40 MG capsule TAKE 1 CAPSULE BY MOUTH EVERY DAY 90 capsule 0   gabapentin (NEURONTIN) 100 MG capsule Take 1 capsule (100 mg total) by mouth 3 (three) times daily. 90 capsule 3   HUMULIN R U-500 KWIKPEN 500 UNIT/ML KwikPen Inject into the skin.     insulin lispro (HUMALOG) 100 UNIT/ML KwikPen Inject 50-70 Units into the skin with breakfast, with lunch, and with evening meal.     montelukast (SINGULAIR) 10 MG tablet TAKE 1 TABLET BY MOUTH EVERYDAY AT BEDTIME 90 tablet 0   nortriptyline (PAMELOR) 10 MG capsule Take 3 capsules (30 mg total) by mouth at bedtime. 270 capsule 3   omeprazole (PRILOSEC) 40 MG capsule Take 40 mg by mouth in the morning.     ondansetron (ZOFRAN-ODT) 4 MG disintegrating tablet Take 1 tablet (4 mg total) by mouth every 8 (eight) hours as needed for nausea or vomiting. 20 tablet 0   propranolol (INDERAL) 60 MG tablet Take 1 tablet (60 mg total) by mouth 2 (two) times daily. 60 tablet 5   tirzepatide (MOUNJARO) 2.5 MG/0.5ML Pen Inject 2.5 mg into the skin once a week. 2 mL 0   Vitamin D, Ergocalciferol, (DRISDOL) 1.25 MG (50000 UNIT) CAPS capsule Take 1 capsule (50,000 Units total) by mouth every 7 (seven) days. 4 capsule 0   No current facility-administered medications for this visit.      ___________________________________________________________________ Objective   Exam:  LMP 11/13/2020 (Exact Date)  Wt Readings from Last 3  Encounters:  03/09/22 232 lb (105.2 kg)  02/18/22 236 lb (107 kg)  01/12/22 239 lb (108.4 kg)    General: ***  Eyes: sclera anicteric, no redness ENT: oral mucosa moist without lesions, no cervical or supraclavicular lymphadenopathy CV: ***, no JVD, no peripheral edema Resp: clear to auscultation bilaterally, normal RR and effort noted GI: soft, *** tenderness, with active bowel sounds. No guarding or palpable organomegaly noted. Skin; warm and dry, no rash or jaundice noted Neuro: awake, alert and oriented x 3. Normal gross motor function and fluent speech  Labs:     Latest Ref Rng & Units 10/29/2021    8:48 AM 06/08/2021    9:10 AM 06/05/2021    3:22 PM  CBC  WBC 3.8 - 10.8 Thousand/uL 2.8  3.6  3.2   Hemoglobin 11.7 - 15.5 g/dL 13.3  13.6  13.3   Hematocrit 35.0 - 45.0 % 40.5  40.0  39.7   Platelets 140 - 400 Thousand/uL 83  91  91.0       Latest Ref Rng & Units 10/29/2021    8:48 AM 07/22/2021    9:37 AM 06/12/2021    2:35 PM  CMP  Glucose 65 - 99 mg/dL 339     BUN 7 - 25 mg/dL 4     Creatinine 0.50 - 0.99 mg/dL 0.42  0.50    Sodium 135 - 146 mmol/L 135     Potassium 3.5 - 5.3 mmol/L 3.9     Chloride 98 - 110 mmol/L 100     CO2 20 - 32 mmol/L 25     Calcium 8.6 - 10.2 mg/dL 9.3     Total Protein 6.1 - 8.1 g/dL 7.3   7.0   Total Bilirubin 0.2 - 1.2 mg/dL 0.8     AST 10 - 30 U/L 53     ALT 6 - 29 U/L 36      Lab Results  Component Value Date   INR 1.2 (H) 06/05/2021   INR 1.1 (H) 03/20/2021   INR 1.1 (H) 08/25/2020   Last AFP 3.4 in Dec 2022  Radiologic Studies:  CLINICAL DATA:  Lower abdominal pain   EXAM: CT ABDOMEN AND PELVIS WITH CONTRAST   TECHNIQUE: Multidetector CT imaging of the abdomen and pelvis was performed using the standard protocol following bolus administration of intravenous contrast.   RADIATION DOSE REDUCTION: This exam was performed according to the departmental dose-optimization program which includes automated exposure control,  adjustment of the mA and/or kV according to patient size and/or use of iterative reconstruction technique.   CONTRAST:  169m OMNIPAQUE IOHEXOL 300 MG/ML  SOLN   COMPARISON:  CT abdomen and pelvis 06/05/2021, pelvic ultrasound 06/08/2021   FINDINGS: Lower chest: Subsegmental atelectatic changes in the lung bases.   Hepatobiliary: Liver is markedly enlarged measuring 23.9 cm in length, with nodular contour consistent with cirrhosis. 7 mm hypodensity in the right hepatic lobe near the gallbladder which appears stable in size. Small gallstones. No gallbladder wall thickening or surrounding inflammatory changes. No biliary ductal dilatation identified.   Pancreas: Unremarkable. No pancreatic ductal dilatation or surrounding inflammatory changes.   Spleen: Markedly enlarged measuring 20.7 cm in length. 2.6 cm likely splenule at the hilum of the spleen.   Adrenals/Urinary Tract: Adrenal glands appear normal. 9 mm hypodense likely cyst in the upper pole left kidney. Punctate cortical calcification in the lower left kidney unchanged. No hydronephrosis or perinephric edema. Urinary bladder is incompletely  distended with no obvious mass visualized.   Stomach/Bowel: No bowel obstruction, free air or pneumatosis. No bowel wall edema. Moderate amount of retained fecal material throughout the colon. Appendix is normal.   Vascular/Lymphatic: No significant vascular findings are present. No enlarged abdominal or pelvic lymph nodes.   Reproductive: Status post hysterectomy. There are bilateral adnexal masses/prominent ovaries measuring 4.2 x 2.8 cm on the right and 4.5 x 2.8 cm on the left, similar to previous study.   Other: No ascites.   Musculoskeletal: No acute or significant osseous findings.   IMPRESSION: 1. Marked hepatomegaly and evidence hepatic cirrhosis. Stable 7 mm hypodensity in the right hepatic lobe since 2022, most likely a cyst. 2. Marked splenomegaly suggesting  portal hypertension. 3. Cholelithiasis. 4. Bilateral prominent ovaries/adnexal masses which appear grossly unchanged since previous CT. Correlate with previous pelvic ultrasound results and consider follow-up ultrasound as indicated.     Electronically Signed   By: Ofilia Neas M.D.   On: 07/22/2021 10:55  Assessment: No diagnosis found.  ***  Plan:  ***  Thank you for the courtesy of this consult.  Please call me with any questions or concerns.  Nelida Meuse III  CC: Referring provider noted above

## 2022-03-23 ENCOUNTER — Ambulatory Visit (INDEPENDENT_AMBULATORY_CARE_PROVIDER_SITE_OTHER): Payer: Commercial Managed Care - PPO | Admitting: Nurse Practitioner

## 2022-03-23 ENCOUNTER — Encounter: Payer: Self-pay | Admitting: Nurse Practitioner

## 2022-03-23 ENCOUNTER — Ambulatory Visit: Payer: Commercial Managed Care - PPO | Admitting: Gastroenterology

## 2022-03-23 ENCOUNTER — Encounter: Payer: Self-pay | Admitting: Gastroenterology

## 2022-03-23 ENCOUNTER — Other Ambulatory Visit (INDEPENDENT_AMBULATORY_CARE_PROVIDER_SITE_OTHER): Payer: Commercial Managed Care - PPO

## 2022-03-23 VITALS — BP 102/64 | HR 70 | Ht 66.0 in | Wt 235.0 lb

## 2022-03-23 VITALS — BP 116/70 | HR 75 | Temp 98.1°F | Ht 66.0 in | Wt 232.0 lb

## 2022-03-23 DIAGNOSIS — K7581 Nonalcoholic steatohepatitis (NASH): Secondary | ICD-10-CM | POA: Diagnosis not present

## 2022-03-23 DIAGNOSIS — K5909 Other constipation: Secondary | ICD-10-CM

## 2022-03-23 DIAGNOSIS — Z7985 Long-term (current) use of injectable non-insulin antidiabetic drugs: Secondary | ICD-10-CM

## 2022-03-23 DIAGNOSIS — K7460 Unspecified cirrhosis of liver: Secondary | ICD-10-CM

## 2022-03-23 DIAGNOSIS — Z794 Long term (current) use of insulin: Secondary | ICD-10-CM

## 2022-03-23 DIAGNOSIS — E669 Obesity, unspecified: Secondary | ICD-10-CM | POA: Diagnosis not present

## 2022-03-23 DIAGNOSIS — R110 Nausea: Secondary | ICD-10-CM

## 2022-03-23 DIAGNOSIS — Z6837 Body mass index (BMI) 37.0-37.9, adult: Secondary | ICD-10-CM

## 2022-03-23 DIAGNOSIS — E1169 Type 2 diabetes mellitus with other specified complication: Secondary | ICD-10-CM

## 2022-03-23 LAB — COMPREHENSIVE METABOLIC PANEL
ALT: 32 U/L (ref 0–35)
AST: 41 U/L — ABNORMAL HIGH (ref 0–37)
Albumin: 3.8 g/dL (ref 3.5–5.2)
Alkaline Phosphatase: 100 U/L (ref 39–117)
BUN: 7 mg/dL (ref 6–23)
CO2: 25 mEq/L (ref 19–32)
Calcium: 8.9 mg/dL (ref 8.4–10.5)
Chloride: 101 mEq/L (ref 96–112)
Creatinine, Ser: 0.46 mg/dL (ref 0.40–1.20)
GFR: 119.11 mL/min (ref >60.00)
Glucose, Bld: 191 mg/dL — ABNORMAL HIGH (ref 70–99)
Potassium: 3.4 mEq/L — ABNORMAL LOW (ref 3.5–5.1)
Sodium: 135 mEq/L (ref 135–145)
Total Bilirubin: 1.3 mg/dL — ABNORMAL HIGH (ref 0.2–1.2)
Total Protein: 7.1 g/dL (ref 6.0–8.3)

## 2022-03-23 LAB — CBC WITH DIFFERENTIAL/PLATELET
Basophils Absolute: 0 10*3/uL (ref 0.0–0.1)
Basophils Relative: 0.7 % (ref 0.0–3.0)
Eosinophils Absolute: 0.1 10*3/uL (ref 0.0–0.7)
Eosinophils Relative: 2.4 % (ref 0.0–5.0)
HCT: 39.5 % (ref 36.0–46.0)
Hemoglobin: 13.3 g/dL (ref 12.0–15.0)
Lymphocytes Relative: 17.5 % (ref 12.0–46.0)
Lymphs Abs: 0.7 10*3/uL (ref 0.7–4.0)
MCHC: 33.6 g/dL (ref 30.0–36.0)
MCV: 84.3 fl (ref 78.0–100.0)
Monocytes Absolute: 0.4 10*3/uL (ref 0.1–1.0)
Monocytes Relative: 9.2 % (ref 3.0–12.0)
Neutro Abs: 2.8 10*3/uL (ref 1.4–7.7)
Neutrophils Relative %: 70.2 % (ref 43.0–77.0)
Platelets: 91 10*3/uL — ABNORMAL LOW (ref 150.0–400.0)
RBC: 4.69 Mil/uL (ref 3.87–5.11)
RDW: 16.2 % — ABNORMAL HIGH (ref 11.5–15.5)
WBC: 4 10*3/uL (ref 4.0–10.5)

## 2022-03-23 LAB — PROTIME-INR
INR: 1.3 ratio — ABNORMAL HIGH (ref 0.8–1.0)
Prothrombin Time: 14.3 s — ABNORMAL HIGH (ref 9.6–13.1)

## 2022-03-23 MED ORDER — TIRZEPATIDE 5 MG/0.5ML ~~LOC~~ SOAJ: 5.0000 mg | SUBCUTANEOUS | 0 refills | Status: DC

## 2022-03-23 NOTE — Patient Instructions (Addendum)
_______________________________________________________  If you are age 41 or older, your body mass index should be between 23-30. Your Body mass index is 37.93 kg/m. If this is out of the aforementioned range listed, please consider follow up with your Primary Care Provider.  If you are age 71 or younger, your body mass index should be between 19-25. Your Body mass index is 37.93 kg/m. If this is out of the aformentioned range listed, please consider follow up with your Primary Care Provider.   ________________________________________________________  The [facility] providers would like to encourage you to use [facility] to communicate with providers for non-urgent requests or questions.  Due to long hold times on the telephone, sending your provider a message by [facility] may be a faster and more efficient way to get a response.  Please allow 48 business hours for a response.  Please remember that this is for non-urgent requests.  _______________________________________________________  Your provider has requested that you go to the basement level for lab work before leaving today. Press "B" on the elevator. The lab is located at the first door on the left as you exit the elevator.  You have been scheduled for an abdominal ultrasound at [facility] (1st floor of hospital) on 03-29-2022 at 10am. Please arrive 30 minutes prior to your appointment for registration. Make certain not to have anything to eat or drink 6 hours prior to your appointment. Should you need to reschedule your appointment, please contact radiology at [phone]. This test typically takes about 30 minutes to perform.  Due to recent changes in healthcare laws, you may see the results of your imaging and laboratory studies on MyChart before your provider has had a chance to review them.  We understand that in some cases there may be results that are confusing or concerning to you. Not all laboratory results come back in the  same time frame and the provider may be waiting for multiple results in order to interpret others.  Please give [name] 48 hours in order for your provider to thoroughly review all the results before contacting the office for clarification of your results.   It was a pleasure to see you today!  Thank you for trusting me with your gastrointestinal care!

## 2022-03-25 LAB — AFP TUMOR MARKER: AFP-Tumor Marker: 5.8 ng/mL

## 2022-03-26 ENCOUNTER — Telehealth: Payer: Self-pay | Admitting: General Surgery

## 2022-03-26 NOTE — Telephone Encounter (Signed)
We had received a phone call from her nurse practitioner at [facility] weight and wellness clinic to see if we would see this patient as a potential bariatric surgery patient.  She reached out to [name] because of the patient's liver disease.  I reviewed several of her CTs, u/s, EGD and most recent labs   Platelet count 91, total bilirubin 1.3, AST 41, creatinine 0.46, sodium 135; normal hemoglobin hematocrit in addn to other labs  I called the patient and discussed this items and concerns.   One of the main things that make her high risk is her upper abdominal varices - she has gastric/splenic varices seen on CT and egd.  Her varices on CT are not minor.  That is where we do the bulk of the bariatric surgery in that area. SHe is a Child Pugh A cirrhotic which old literature carries a 10% perioperative mortality. MELD 10.  We do use a bariatric surgery risk calculator based on the national [place] registry - but it doesn't capture these variables so can't really use it for her.    She is too high risk for our local [facility] program.  We can discuss her case with our colleagues in [place] and get their thoughts.  They are even doing bariatric surgery at the same time as like transplant surgery for other reasons.     In interim, I would try to continue her on mounjaro and manage the side effects from that medication - constipation/nausea as you are well aware.    Patient asked me if I could talk to our colleagues at [facility] to see if they think that she is a potential candidate.  I told her I could send him an email but I could not guarantee a response.  We did discuss how to establish an appointment at [facility] with the bariatric surgeons in [place].  I advised her that she could go to the [facility] and find bariatric surgery and they have an online application mainly to gather demographic information so they can verify whether or not she has bariatric surgery coverage.  I then gave her a generic  overview of how they generally do the first visit which is a group visit with visits with multiple disciplines and is generally 1/2-day appointment.  She voiced understanding.

## 2022-03-29 ENCOUNTER — Ambulatory Visit (HOSPITAL_COMMUNITY)
Admission: RE | Admit: 2022-03-29 | Discharge: 2022-03-29 | Disposition: A | Discharge status: Home / Self Care | Payer: Commercial Managed Care - PPO | Source: Ambulatory Visit | Attending: Gastroenterology | Admitting: Gastroenterology

## 2022-03-29 ENCOUNTER — Ambulatory Visit (HOSPITAL_COMMUNITY): Payer: Commercial Managed Care - PPO

## 2022-03-29 DIAGNOSIS — R110 Nausea: Secondary | ICD-10-CM | POA: Insufficient documentation

## 2022-03-29 DIAGNOSIS — K5909 Other constipation: Secondary | ICD-10-CM | POA: Diagnosis present

## 2022-03-29 DIAGNOSIS — K7460 Unspecified cirrhosis of liver: Secondary | ICD-10-CM | POA: Diagnosis present

## 2022-03-29 DIAGNOSIS — K7581 Nonalcoholic steatohepatitis (NASH): Secondary | ICD-10-CM | POA: Insufficient documentation

## 2022-04-06 NOTE — Progress Notes (Unsigned)
Chief Complaint:   OBESITY Laurie Sutton is here to discuss her progress with her obesity treatment plan along with follow-up of her obesity related diagnoses. Laurie Sutton is on following a lower carbohydrate, vegetable and lean protein rich diet plan and states she is following her eating plan approximately 75% of the time. Laurie Sutton states she is walking 60 minutes 5 times per week.  Today's visit was #: 14 Starting weight: 230 lbs Starting date: 06/09/2021 Today's weight: 232 lbs Today's date: 03/23/2022 Total lbs lost to date: 0 lbs Total lbs lost since last in-office visit: 0  Interim History: Laurie Sutton has maintained her weight since her last visit. I had referred her to [facility] after her last visit but she has not received a call to schedule an appointment yet.  Subjective:   1. Liver cirrhosis secondary to NASH (nonalcoholic steatohepatitis) ([place]) Laurie Sutton saw GI today. She is scheduled for a liver US next week and labs were obtained today.   2. Type 2 diabetes mellitus with other specified complication, with long-term current use of insulin (Laurie Sutton) Laurie Sutton saw Endo since her last visit. She is taking Mounjaro 2.5 mg and Humulin 120 units in am and 100 units for lunch and dinner. Average blood sugars: 231. Some constipation.  Assessment/Plan:   1. Liver cirrhosis secondary to NASH (nonalcoholic steatohepatitis) ([place]) Keep follow up appointment in Jan with GI.  2. Type 2 diabetes mellitus with other specified complication, with long-term current use of insulin ([place]) Increase/Refill Mounjaro to 5 mg SQ once weekly for 1 month with 0 refills. Take Zofran as needed for nausea.   -Increase/Refill tirzepatide (MOUNJARO) 5 MG/0.5ML Pen; Inject 5 mg into the skin once a week.  Dispense: 2 mL; Refill: 0  3. Obesity Current BMI 37.5 Laurie Sutton is currently in the action stage of change. As such, her goal is to continue with weight loss efforts. She has agreed to following a lower carbohydrate, vegetable and  lean protein rich diet plan.   Exercise goals: All adults should avoid inactivity. Some physical activity is better than none, and adults who participate in any amount of physical activity gain some health benefits.  Will refer to [facility] for evaluation for bariatric surgery.   Behavioral modification strategies: increasing lean protein intake, increasing water intake, and holiday eating strategies .  Laurie Sutton has agreed to follow-up with our clinic in 3 weeks. She was informed of the importance of frequent follow-up visits to maximize her success with intensive lifestyle modifications for her multiple health conditions.   Objective:   Blood pressure 116/70, pulse 75, temperature 98.1 F (36.7 C), temperature source Oral, height 5' 6"  (1.676 m), weight 232 lb (105.2 kg), last menstrual period 11/13/2020, SpO2 97 %. Body mass index is 37.45 kg/m.  General: Cooperative, alert, well developed, in no acute distress. HEENT: Conjunctivae and lids unremarkable. Cardiovascular: Regular rhythm.  Lungs: Normal work of breathing. Neurologic: No focal deficits.   Lab Results  Component Value Date   CREATININE 0.46 03/23/2022   BUN 7 03/23/2022   NA 135 03/23/2022   K 3.4 (L) 03/23/2022   CL 101 03/23/2022   CO2 25 03/23/2022   Lab Results  Component Value Date   ALT 32 03/23/2022   AST 41 (H) 03/23/2022   ALKPHOS 100 03/23/2022   BILITOT 1.3 (H) 03/23/2022   Lab Results  Component Value Date   HGBA1C 9.6 04/27/2019   HGBA1C 6.2 08/19/2017   HGBA1C 6.9 (H) 06/07/2016   HGBA1C 7.5 (H) 12/30/2015  HGBA1C 6.0 08/21/2015   No results found for: "INSULIN" Lab Results  Component Value Date   TSH 1.170 06/09/2021   Lab Results  Component Value Date   CHOL 169 08/19/2017   HDL 38 08/19/2017   LDLCALC 91 08/19/2017   TRIG 202 (A) 08/19/2017   CHOLHDL 4 05/12/2015   Lab Results  Component Value Date   VD25OH 34 10/29/2021   VD25OH 12.8 (L) 06/09/2021   Lab Results  Component  Value Date   WBC 4.0 03/23/2022   HGB 13.3 03/23/2022   HCT 39.5 03/23/2022   MCV 84.3 03/23/2022   PLT 91.0 (L) 03/23/2022   No results found for: "IRON", "TIBC", "FERRITIN"  Attestation Statements:   Reviewed by clinician on day of visit: allergies, medications, problem list, medical history, surgical history, family history, social history, and previous encounter notes.  I, Brendell Tyus, RMA, am acting as transcriptionist for Everardo Pacific, FNP.  I have reviewed the above documentation for accuracy and completeness, and I agree with the above. Everardo Pacific, FNP

## 2022-04-08 ENCOUNTER — Ambulatory Visit: Payer: Commercial Managed Care - PPO | Admitting: Family Medicine

## 2022-04-08 ENCOUNTER — Encounter: Payer: Self-pay | Admitting: Family Medicine

## 2022-04-08 VITALS — BP 112/64 | HR 80 | Temp 98.0°F | Wt 236.0 lb

## 2022-04-08 DIAGNOSIS — E1169 Type 2 diabetes mellitus with other specified complication: Secondary | ICD-10-CM | POA: Diagnosis not present

## 2022-04-08 DIAGNOSIS — Z794 Long term (current) use of insulin: Secondary | ICD-10-CM

## 2022-04-08 DIAGNOSIS — J069 Acute upper respiratory infection, unspecified: Secondary | ICD-10-CM

## 2022-04-08 DIAGNOSIS — K625 Hemorrhage of anus and rectum: Secondary | ICD-10-CM

## 2022-04-08 DIAGNOSIS — L732 Hidradenitis suppurativa: Secondary | ICD-10-CM

## 2022-04-08 LAB — POCT GLYCOSYLATED HEMOGLOBIN (HGB A1C): Hemoglobin A1C: 9.1 % — AB (ref 4.0–5.6)

## 2022-04-08 MED ORDER — HUMULIN R U-500 KWIKPEN 500 UNIT/ML ~~LOC~~ SOPN: PEN_INJECTOR | SUBCUTANEOUS | 0 refills | Status: DC

## 2022-04-08 NOTE — Progress Notes (Signed)
   Subjective:    Patient ID: Laurie Sutton, female    DOB: 1981/02/20, 41 y.o.   MRN: [id number]  HPI Here for several issues. First she asks me to check a hole in the right armpit where she had a boil a few months ago. We saw her on 01-12-22 for the hydradenitis, and we treated this with Clindamycin. The infection resolved but she has had this defect ever since. Second she developed some PND and a dry cough yesterday. No fever or ST or headache or body aches. Third she saw a small amount of bright red blood yesterday when she passed a stool. The stool was soft and easy to pass, and there was not pain when she passed it. She has a hx of external hemorrhoids, but these have not bothered her in months.    Review of Systems  Constitutional: Negative.   HENT:  Positive for postnasal drip. Negative for congestion, ear pain, sneezing and sore throat.   Eyes: Negative.   Respiratory:  Positive for cough. Negative for shortness of breath and wheezing.   Gastrointestinal:  Positive for anal bleeding. Negative for abdominal distention, abdominal pain, blood in stool, constipation, diarrhea, nausea, rectal pain and vomiting.       Objective:   Physical Exam Constitutional:      Appearance: Normal appearance.  HENT:     Right Ear: Tympanic membrane, ear canal and external ear normal.     Left Ear: Tympanic membrane, ear canal and external ear normal.     Nose: Nose normal.     Mouth/Throat:     Pharynx: Oropharynx is clear.  Eyes:     Conjunctiva/sclera: Conjunctivae normal.  Pulmonary:     Effort: Pulmonary effort is normal.     Breath sounds: Normal breath sounds.  Genitourinary:    Rectum: Normal.  Lymphadenopathy:     Cervical: No cervical adenopathy.  Skin:    Comments: The right axilla has a 4 mm depression in the skin which is dry and looks clean  Neurological:     Mental Status: She is alert.           Assessment & Plan:  She skin defect in the right axilla is the  remains of a boil that resolved. I reassured her that this will slowly fill back in over the next month or two. Second she seems to have the beginnings of a viral URI. She will follow up if things get worse. Third she may have some internal hemorrhoids that bled yesterday. She is scheduled to see Dr. Loletha Carrow in GI on 05-03-21, and I advised her to ask him about this.  Alysia Penna, MD

## 2022-04-08 NOTE — Addendum Note (Signed)
Addended by: Wyvonne Lenz on: 04/08/2022 09:13 AM   Modules accepted: Orders

## 2022-04-09 ENCOUNTER — Telehealth: Payer: Self-pay | Admitting: Family Medicine

## 2022-04-09 ENCOUNTER — Other Ambulatory Visit: Payer: Self-pay

## 2022-04-09 ENCOUNTER — Other Ambulatory Visit: Payer: Self-pay | Admitting: Nurse Practitioner

## 2022-04-09 DIAGNOSIS — E559 Vitamin D deficiency, unspecified: Secondary | ICD-10-CM

## 2022-04-09 MED ORDER — CEFUROXIME AXETIL 500 MG PO TABS: 500.0000 mg | ORAL_TABLET | Freq: Two times a day (BID) | ORAL | 0 refills | Status: AC

## 2022-04-09 NOTE — Telephone Encounter (Signed)
Pt is calling and was seen yesterday and has temp 100.2. Please advise

## 2022-04-09 NOTE — Telephone Encounter (Signed)
Called patient made aware prescription for Cefuroxime 500 mg is sent to [facility]

## 2022-04-09 NOTE — Telephone Encounter (Signed)
Call in Cefuroxime 500 mg BID for 10 days

## 2022-04-12 ENCOUNTER — Encounter: Payer: Self-pay | Admitting: Neurology

## 2022-04-13 ENCOUNTER — Ambulatory Visit: Payer: Commercial Managed Care - PPO | Admitting: Neurology

## 2022-04-13 NOTE — Telephone Encounter (Signed)
Access Nurse Call 04/13/22 at 1452  ---Caller states her daughters temperature is reading 100.1 (ear), wet cough, congestion, feels weak. Symptoms started December 28. Was seen at [facility] negative. On Tamiflu for suspected flu. Alternating Tylenol and Ibuprofen and taking Benzo crystals for cough. Urinating normal. Decreased appetite. Caller states she has an appointment tomorrow, caller would like to know what she can do to help her in the mean time.  04/13/2022 3:01:45 PM See PCP within 24 Hours Larene Pickett, RN, Marcie Bal  Referrals REFERRED TO PCP OFFICE  Pt has appt with Tommi Rumps on 04/14/22 at 1:30. It will be Day 6 by the time she comes in. In "Care Everywhere", noted that pt was also advised to take [place].

## 2022-04-14 ENCOUNTER — Ambulatory Visit (INDEPENDENT_AMBULATORY_CARE_PROVIDER_SITE_OTHER): Payer: BC Managed Care – PPO

## 2022-04-14 ENCOUNTER — Ambulatory Visit: Payer: BC Managed Care – PPO | Admitting: Adult Health

## 2022-04-14 ENCOUNTER — Encounter: Payer: Self-pay | Admitting: Adult Health

## 2022-04-14 VITALS — BP 98/80 | HR 76 | Temp 97.5°F | Ht 66.0 in | Wt 234.0 lb

## 2022-04-14 DIAGNOSIS — J988 Other specified respiratory disorders: Secondary | ICD-10-CM

## 2022-04-14 MED ORDER — DOXYCYCLINE HYCLATE 100 MG PO CAPS: 100.0000 mg | ORAL_CAPSULE | Freq: Two times a day (BID) | ORAL | 0 refills | Status: DC

## 2022-04-14 MED ORDER — ALBUTEROL SULFATE HFA 108 (90 BASE) MCG/ACT IN AERS: 2.0000 | INHALATION_SPRAY | Freq: Four times a day (QID) | RESPIRATORY_TRACT | 0 refills | Status: DC | PRN

## 2022-04-14 NOTE — Telephone Encounter (Signed)
Pt was seen by Tommi Rumps this afternoon

## 2022-04-14 NOTE — Progress Notes (Signed)
Subjective:    Patient ID: Laurie Sutton, female    DOB: February 14, 1981, 42 y.o.   MRN: [id number]  HPI 42 year old female who  has a past medical history of ADHD, Anemia, Anxiety, Back pain, COVID (08/12/2019), Depression, Diabetic neuropathy ([place]) (11/11/2020), dm type 2, Fatty liver, GERD (gastroesophageal reflux disease), History of kidney stones, Joint pain, Menorrhagia (11/11/2020), Migraines, Murmur, cardiac, Other fatigue, Pneumonia (08/12/2019), and Shortness of breath on exertion.  She is a patient of Dr. Sarajane Jews for an acute issue. Her symptoms have been present for 2 weeks. Symptoms include that of cough semi productive cough,,  congestion in her sinus cavities and chest, sinus pressure.  fever up to 102.5, fatigue, body aches, wheezing  She does report that she went to UC five days ago and was treated with Tamiflu for suspected influenza ) reports that there were no flu tests left).    Fever has resolved and she is not aching as much but other symptoms are still present and not getting any better   Denies n/v/d/   Review of Systems See HPI   Past Medical History:  Diagnosis Date   ADHD    Anemia    IRON TRANSFUSION 07-2020 NONE SINCE   Anxiety    Back pain    COVID 08/12/2019   ALL SYMPTOMS REOLVED PER PT   Depression    Diabetic neuropathy ([place]) 11/11/2020   FEET   dm type 2    sees Dr. Alanson Aly at [facility]   Fatty liver    GERD (gastroesophageal reflux disease)    History of kidney stones    Joint pain    Menorrhagia 11/11/2020   Migraines    Murmur, cardiac    FAINT NO CARDIOLOGIST   Other fatigue    Pneumonia 08/12/2019   COVID PNEUMONIA ALL SYMPTOMS RESOLVED PER PT   Shortness of breath on exertion     Social History   Socioeconomic History   Marital status: Married    Spouse name: Adam   Number of children: 2   Years of education: Not on file   Highest education level: 12th grade  Occupational History   Occupation: Haematologist and  Nutrition    Employer: [employer]: works 20 hours per week  Tobacco Use   Smoking status: Never    Passive exposure: Current   Smokeless tobacco: Never  Vaping Use   Vaping Use: Never used  Substance and Sexual Activity   Alcohol use: No    Alcohol/week: 0.0 standard drinks of alcohol   Drug use: No   Sexual activity: Yes    Partners: Male    Birth control/protection: None  Other Topics Concern   Not on file  Social History Narrative   Not on file   Social Determinants of Health   Financial Resource Strain: Low Risk  (06/04/2021)   Overall Financial Resource Strain (CARDIA)    Difficulty of Paying Living Expenses: Not hard at all  Food Insecurity: No Food Insecurity (06/04/2021)   Hunger Vital Sign    Worried About Running Out of Food in the Last Year: Never true    Ran Out of Food in the Last Year: Never true  Transportation Needs: No Transportation Needs (06/04/2021)   PRAPARE - Transportation    Lack of Transportation (Medical): No    Lack of Transportation (Non-Medical): No  Physical Activity: Insufficiently Active (06/04/2021)   Exercise Vital Sign    Days of Exercise  per Week: 2 days    Minutes of Exercise per Session: 10 min  Stress: Stress Concern Present (06/04/2021)   [employer] of Occupational Health - Occupational Stress Questionnaire    Feeling of Stress : To some extent  Social Connections: Socially Integrated (06/04/2021)   Social Connection and Isolation Panel [NHANES]    Frequency of Communication with Friends and Family: More than three times a week    Frequency of Social Gatherings with Friends and Family: More than three times a week    Attends Religious Services: More than 4 times per year    Active Member of [employer] or Organizations: Yes    Attends Engineer, structural: More than 4 times per year    Marital Status: Married  Catering manager Violence: Not on file    Past Surgical History:  Procedure Laterality  Date   CESAREAN SECTION  12/13/2006   EXTRACORPOREAL SHOCK WAVE LITHOTRIPSY  2018   FOOT SURGERY Right 10/02/2020   RIGHT FOOT HEEL AND TOES, SUR[facility] OFF [address]   LAPAROSCOPIC VAGINAL HYSTERECTOMY WITH SALPINGECTOMY Bilateral 11/17/2020   Procedure: LAPAROSCOPIC ASSISTED VAGINAL HYSTERECTOMY WITH BILATERAL SALPINGECTOMY;  Surgeon: Mitchel Honour, DO;  Location: [facility];  Service: Gynecology;  Laterality: Bilateral;    Family History  Problem Relation Age of Onset   Depression Mother    Anxiety disorder Mother    Sleep apnea Father    Anxiety disorder Father    Depression Father    Diabetes Father    Bladder Cancer Father 68   Rheum arthritis Father    Migraines Maternal Grandmother    Diabetes Maternal Grandfather    Healthy Daughter    Asthma Daughter    Anxiety disorder Daughter    Anxiety disorder Son    Asthma Son    Healthy Son    Migraines Maternal Aunt    Cerebral aneurysm Cousin    Heart disease Other    Depression Other    Anxiety disorder Other    Sleep apnea Other    Colon cancer Neg Hx    Esophageal cancer Neg Hx    Stomach cancer Neg Hx    Rectal cancer Neg Hx     Allergies  Allergen Reactions   Amoxicillin Hives and Itching   Azithromycin     DOES NOT WORK   Dulaglutide Other (See Comments)    JANUVIA STOAMCH HURT?   Januvia [Sitagliptin] Other (See Comments)    HURTS STOAMCH   Latex     IRRITATES VAGINAL AREA   Penicillins Hives    Has patient had a PCN reaction causing immediate rash, facial/tongue/throat swelling, SOB or lightheadedness with hypotension: yes. Rash  Has patient had a PCN reaction causing severe rash involving mucus membranes or skin necrosis: Yes- rash and hives all over body  Has patient had a PCN reaction that required hospitalization No Has patient had a PCN reaction occurring within the last 10 years: Yes  If all of the above answers are "NO", then may proceed with Cephalosporin use.      Current Outpatient Medications on File Prior to Visit  Medication Sig Dispense Refill   ALPRAZolam (XANAX) 1 MG tablet Take 1 tablet (1 mg total) by mouth 2 (two) times daily as needed for anxiety. 60 tablet 5   cefUROXime (CEFTIN) 500 MG tablet Take 1 tablet (500 mg total) by mouth 2 (two) times daily with a meal for 10 days. 20 tablet 0   clotrimazole-betamethasone (LOTRISONE) cream  Apply 1 application topically 2 (two) times daily as needed. 30 g 5   Continuous Blood Gluc Sensor (FREESTYLE LIBRE 14 DAY SENSOR) MISC Apply topically every 14 (fourteen) days.     Cranberry 125 MG TABS Take by mouth 2 (two) times daily.     docusate sodium (COLACE) 100 MG capsule Take 1 capsule (100 mg total) by mouth 2 (two) times daily as needed for mild constipation. 20 capsule 0   fluconazole (DIFLUCAN) 150 MG tablet Take 1 tablet (150 mg total) by mouth daily as needed (yeast infection). 10 tablet 5   FLUoxetine (PROZAC) 40 MG capsule TAKE 1 CAPSULE BY MOUTH EVERY DAY 90 capsule 0   gabapentin (NEURONTIN) 100 MG capsule Take 1 capsule (100 mg total) by mouth 3 (three) times daily. 90 capsule 3   HUMULIN R U-500 KWIKPEN 500 UNIT/ML KwikPen Use 120 units at breakfast, 100 at lunch, and 100 at dinner 1 mL 0   montelukast (SINGULAIR) 10 MG tablet TAKE 1 TABLET BY MOUTH EVERYDAY AT BEDTIME 90 tablet 0   nortriptyline (PAMELOR) 10 MG capsule Take 3 capsules (30 mg total) by mouth at bedtime. 270 capsule 3   omeprazole (PRILOSEC) 40 MG capsule Take 40 mg by mouth in the morning.     ondansetron (ZOFRAN-ODT) 4 MG disintegrating tablet Take 1 tablet (4 mg total) by mouth every 8 (eight) hours as needed for nausea or vomiting. 20 tablet 0   propranolol (INDERAL) 60 MG tablet Take 1 tablet (60 mg total) by mouth 2 (two) times daily. 60 tablet 5   tirzepatide (MOUNJARO) 5 MG/0.5ML Pen Inject 5 mg into the skin once a week. 2 mL 0   Vitamin D, Ergocalciferol, (DRISDOL) 1.25 MG (50000 UNIT) CAPS capsule Take 1 capsule  (50,000 Units total) by mouth every 7 (seven) days. 4 capsule 0   No current facility-administered medications on file prior to visit.    BP 98/80   Pulse 76   Temp (!) 97.5 F (36.4 C) (Oral)   Ht 5\' 6"  (1.676 m)   Wt 234 lb (106.1 kg)   LMP 11/13/2020 (Exact Date)   SpO2 97%   BMI 37.77 kg/m       Objective:   Physical Exam Vitals and nursing note reviewed.  Constitutional:      Appearance: Normal appearance.  Cardiovascular:     Rate and Rhythm: Normal rate and regular rhythm.     Pulses: Normal pulses.     Heart sounds: Normal heart sounds.  Pulmonary:     Effort: Pulmonary effort is normal.     Breath sounds: Examination of the left-lower field reveals rhonchi. Rhonchi present.  Musculoskeletal:        General: Normal range of motion.  Skin:    General: Skin is warm and dry.     Capillary Refill: Capillary refill takes less than 2 seconds.  Neurological:     General: No focal deficit present.     Mental Status: She is alert and oriented to person, place, and time.  Psychiatric:        Mood and Affect: Mood normal.        Behavior: Behavior normal.        Thought Content: Thought content normal.        Judgment: Judgment normal.       Assessment & Plan:   1. Respiratory infection  - DG Chest 2 View; Future - doxycycline (VIBRAMYCIN) 100 MG capsule; Take 1 capsule (100 mg total) by  mouth 2 (two) times daily.  Dispense: 14 capsule; Refill: 0 - albuterol (VENTOLIN HFA) 108 (90 Base) MCG/ACT inhaler; Inhale 2 puffs into the lungs every 6 (six) hours as needed for wheezing or shortness of breath.  Dispense: 8 g; Refill: 0   Dorothyann Peng, NP

## 2022-04-14 NOTE — Telephone Encounter (Signed)
She will see Tommi Rumps later today and he will treat accordingly

## 2022-04-15 ENCOUNTER — Telehealth: Payer: Self-pay | Admitting: Family Medicine

## 2022-04-15 NOTE — Telephone Encounter (Signed)
Returning call for imaging results

## 2022-04-15 NOTE — Telephone Encounter (Signed)
Results have been discussed with patient 

## 2022-04-21 ENCOUNTER — Ambulatory Visit: Payer: BC Managed Care – PPO | Admitting: Nurse Practitioner

## 2022-04-21 ENCOUNTER — Encounter: Payer: Self-pay | Admitting: Nurse Practitioner

## 2022-04-21 VITALS — BP 107/67 | HR 77 | Temp 98.0°F | Ht 66.0 in | Wt 226.0 lb

## 2022-04-21 DIAGNOSIS — Z794 Long term (current) use of insulin: Secondary | ICD-10-CM

## 2022-04-21 DIAGNOSIS — Z6836 Body mass index (BMI) 36.0-36.9, adult: Secondary | ICD-10-CM | POA: Diagnosis not present

## 2022-04-21 DIAGNOSIS — E669 Obesity, unspecified: Secondary | ICD-10-CM | POA: Diagnosis not present

## 2022-04-21 DIAGNOSIS — R110 Nausea: Secondary | ICD-10-CM | POA: Diagnosis not present

## 2022-04-21 DIAGNOSIS — E1169 Type 2 diabetes mellitus with other specified complication: Secondary | ICD-10-CM | POA: Diagnosis not present

## 2022-04-21 DIAGNOSIS — Z7985 Long-term (current) use of injectable non-insulin antidiabetic drugs: Secondary | ICD-10-CM

## 2022-04-21 MED ORDER — ONDANSETRON 4 MG PO TBDP: 4.0000 mg | ORAL_TABLET | Freq: Three times a day (TID) | ORAL | 0 refills | Status: DC | PRN

## 2022-04-23 ENCOUNTER — Other Ambulatory Visit: Payer: Self-pay | Admitting: Family Medicine

## 2022-04-23 DIAGNOSIS — J309 Allergic rhinitis, unspecified: Secondary | ICD-10-CM

## 2022-04-28 NOTE — Progress Notes (Signed)
Chief Complaint:   OBESITY Laurie Sutton is here to discuss her progress with her obesity treatment plan along with follow-up of her obesity related diagnoses. Laurie Sutton is on following a lower carbohydrate, vegetable and lean protein rich diet plan and states she is following her eating plan approximately 50% of the time. Laurie Sutton states she is exercising 0 minutes 0 times per week.  Today's visit was #: 15 Starting weight: 230 lbs Starting date: 06/09/2021 Today's weight: 226 lbs Today's date: 04/21/2022 Total lbs lost to date: 4 lbs Total lbs lost since last in-office visit: 6  Interim History: Laurie Sutton has done well with weight loss since her last visit.  She is considered too high risk for surgery at [place].  I have discussed her POC with GI/Dr. Redmond Pulling.  Seeing Dr. Loletha Carrow on 05/03/22. Would like to discuss options with [facility].    Subjective:   1. Type 2 diabetes mellitus with other specified complication, with long-term current use of insulin (Laurie Sutton) Laurie Sutton saw Endo yesterday.  She is to take Humulin 60 units at breakfast, 100 units at lunch if eating or skips dose if not eating and 50 units at dinner.  She is also taking Mounjaro 5 mg (2nd dose tonight).  Notes some side effects of nausea.  Last A1c was 9.  One Blood sugar of 69, most are in 250s.  2. Nausea Laurie Sutton is taking Zofran as needed.  Denies side effects.  Assessment/Plan:   1. Type 2 diabetes mellitus with other specified complication, with long-term current use of insulin ([facility]) Continue Mounjaro 5 mg and add Zofran as needed for nausea.  Continue to follow up with Endo.  Will return in 2 weeks to readdress Mounjaro and insulin dose.  2. Nausea Will refill Zofran 4 mg daily as needed for 1 month with 0 refills.  Side effects discussed.   -Refill ondansetron (ZOFRAN-ODT) 4 MG disintegrating tablet; Take 1 tablet (4 mg total) by mouth every 8 (eight) hours as needed for nausea or vomiting.  Dispense: 20 tablet; Refill: 0  3. Obesity  Current BMI 36.6 Laurie Sutton is currently in the action stage of change. As such, her goal is to continue with weight loss efforts. She has agreed to keeping a food journal and adhering to recommended goals of 1700-1800 calories and 30+ grams of protein.   Exercise goals: All adults should avoid inactivity. Some physical activity is better than none, and adults who participate in any amount of physical activity gain some health benefits.  Behavioral modification strategies: increasing lean protein intake, increasing water intake, and no skipping meals.  [name] has agreed to follow-up with our clinic in 2 weeks. She was informed of the importance of frequent follow-up visits to maximize her success with intensive lifestyle modifications for her multiple health conditions.   Objective:   Blood pressure 107/67, pulse 77, temperature 98 F (36.7 C), height 5\' 6"  (1.676 m), weight 226 lb (102.5 kg), last menstrual period 11/13/2020, SpO2 97 %. Body mass index is 36.48 kg/m.  General: Cooperative, alert, well developed, in no acute distress. HEENT: Conjunctivae and lids unremarkable. Cardiovascular: Regular rhythm.  Lungs: Normal work of breathing. Neurologic: No focal deficits.   Lab Results  Component Value Date   Laurie Sutton 0.46 03/23/2022   Laurie Sutton 7 03/23/2022   Laurie Sutton 135 03/23/2022   Laurie Sutton 3.4 (L) 03/23/2022   Laurie Sutton 101 03/23/2022   Laurie Sutton 25 03/23/2022   Lab Results  Component Value Date   Laurie Sutton 32 03/23/2022   Laurie Sutton  41 (H) 03/23/2022   Laurie Sutton 100 03/23/2022   Laurie Sutton 1.3 (H) 03/23/2022   Lab Results  Component Value Date   Laurie Sutton 9.1 (A) 04/08/2022   Laurie Sutton 9.6 04/27/2019   Laurie Sutton 6.2 08/19/2017   Laurie Sutton 6.9 (H) 06/07/2016   Laurie Sutton 7.5 (H) 12/30/2015   No results found for: "INSULIN" Lab Results  Component Value Date   Laurie Sutton 1.170 06/09/2021   Lab Results  Component Value Date   Laurie Sutton 169 08/19/2017   Laurie Sutton 38 08/19/2017   Laurie Sutton 91 08/19/2017   Laurie Sutton 202 (A) 08/19/2017   CHOLHDL 4  05/12/2015   Lab Results  Component Value Date   VD25OH 34 10/29/2021   VD25OH 12.8 (L) 06/09/2021   Lab Results  Component Value Date   WBC 4.0 03/23/2022   HGB 13.3 03/23/2022   HCT 39.5 03/23/2022   MCV 84.3 03/23/2022   PLT 91.0 (L) 03/23/2022   No results found for: "IRON", "TIBC", "FERRITIN"  Attestation Statements:   Reviewed by clinician on day of visit: allergies, medications, problem list, medical history, surgical history, family history, social history, and previous encounter notes.  I, Brendell Tyus, RMA, am acting as transcriptionist for Everardo Pacific, FNP.  I have reviewed the above documentation for accuracy and completeness, and I agree with the above. Everardo Pacific, FNP

## 2022-05-03 ENCOUNTER — Ambulatory Visit: Payer: Commercial Managed Care - PPO | Admitting: Gastroenterology

## 2022-05-03 ENCOUNTER — Encounter: Payer: Self-pay | Admitting: Gastroenterology

## 2022-05-03 ENCOUNTER — Ambulatory Visit: Payer: Self-pay | Admitting: Gastroenterology

## 2022-05-03 VITALS — BP 122/72 | HR 83 | Ht 66.0 in | Wt 218.0 lb

## 2022-05-03 DIAGNOSIS — K7581 Nonalcoholic steatohepatitis (NASH): Secondary | ICD-10-CM

## 2022-05-03 DIAGNOSIS — K7469 Other cirrhosis of liver: Secondary | ICD-10-CM

## 2022-05-03 DIAGNOSIS — I8510 Secondary esophageal varices without bleeding: Secondary | ICD-10-CM

## 2022-05-03 DIAGNOSIS — K5909 Other constipation: Secondary | ICD-10-CM

## 2022-05-03 DIAGNOSIS — K7460 Unspecified cirrhosis of liver: Secondary | ICD-10-CM

## 2022-05-03 MED ORDER — LINACLOTIDE 145 MCG PO CAPS: 145.0000 ug | ORAL_CAPSULE | Freq: Every day | ORAL | 0 refills | Status: DC

## 2022-05-03 NOTE — Progress Notes (Signed)
[facility] Progress Note  Chief Complaint: Chronic constipation and cirrhosis  Subjective  History: Review of pertinent gastrointestinal problems: 1.  GERD without alarm symptoms evaluated 2016 Dr. [name].  She was drinking multiple (5-10) cans of caffeinated soda daily, also 10-15 peppermint candies daily.  I recommended lifestyle modifications and observation.  She was going to try to wean off of proton pump inhibitor over time as well. 2.  Upper abdominal pain led to EGD November 2021.  Mild distal gastritis was noted, biopsies were negative for H. Pylori. 3.  Likely fatty liver.  ?  Cirrhosis.  Ultrasound September 2016 showed "enlarged liver with increased echogenicity suggestive of steatosis.  Splenomegaly."  CT scan September 2016 showed "fatty infiltration of the liver". Ultrasound of liver with elastography June 2022 showed a median kPa of 4.9.  This falls into the diagnostic category of "high probability of being normal" CT scan abdomen pelvis with IV and oral contrast August 2022 indication evaluate for periumbilical hernia" suggested morphologic signs of cirrhosis. Lab work-up for chronic liver disease December 2022 : hepatitis A total antibody reactive, hepatitis C antibody negative, hepatitis B surface antigen negative, hepatitis B surface antibody negative, ceruloplasmin normal, anti-smooth muscle antibody IgG slightly elevated at 23, ANA positive titer 1:80, AMA negative, alpha-1 antitrypsin level normal, If cirrhosis, MELD 7 based on 12/22 labs: AST, ALT both slightly elevated, platelets 115  From my (Danis) office note seeing her for the first time on 03/23/2022: Chronic constipation, likely motility issue with the way she describes it.  Does not sound typical for pelvic floor dysfunction.  Predates the use of GLP-1 agonist, though those medicines are likely making this problem worse lately.  I am concerned that if she goes up on the dose her constipation and upper  digestive side effects will worsen. Rather than additional prescription medicine, recommended as needed use of MiraLAX and the use of nighttime OTC magnesium supplement.   Nausea that seems likely side effect of GLP-1 agonist   NASH related cirrhosis with portal hypertension and nonbleeding esophageal varices.  She was surprised to hear this, so we had an initial discussion about the nature of this and the need for beta-blocker prophylaxis.  She will need to be on that medicine indefinitely titrating for resting heart rate of 55-60 ideally. Needs up-to-date [place] screening and MELD labs.   I told her that if the time comes for her to meet with a bariatric program, they need to know about her cirrhosis b/c the Dx may preclude such surgery.   Multiple issues on a complex patient I am just getting to meet, so some additional testing ordered as described below and we have scheduled a follow-up.  Plan:     CBC/CMP/INR/AFP RUQ [name] with doppler flow OV 6-8 weeks  __________________________________ Estill Bamberg continues to have constipation for which she takes stool softeners and periodic MiraLAX.  Even with that she sometimes struggles and goes 2 to 3 days without a BM.  Weight loss clinic changed her to [facility] and increase the dose from 2.5 to 5 mg.  She also heard from the [place] bariatric surgeon Redmond Pulling) who considers her to high risk for bariatric surgery.  Their recommendation was that she consider evaluation in [place] or at [facility] if she wished to pursue that.  Jasline tells me she discussed it further with the weight loss clinic provider and they decided to hold off on that hoping she might get further weight loss with their plan.  ROS:  Cardiovascular:  no chest pain Respiratory: no dyspnea  The patient's Past Medical, Family and Social History were reviewed and are on file in the EMR.  Objective:  Med list reviewed  Current Outpatient Medications:    albuterol (VENTOLIN HFA) 108 (90 Base)  MCG/ACT inhaler, Inhale 2 puffs into the lungs every 6 (six) hours as needed for wheezing or shortness of breath., Disp: 8 g, Rfl: 0   ALPRAZolam (XANAX) 1 MG tablet, Take 1 tablet (1 mg total) by mouth 2 (two) times daily as needed for anxiety., Disp: 60 tablet, Rfl: 5   clotrimazole-betamethasone (LOTRISONE) cream, Apply 1 application topically 2 (two) times daily as needed., Disp: 30 g, Rfl: 5   Continuous Blood Gluc Sensor (FREESTYLE LIBRE 14 DAY SENSOR) MISC, Apply topically every 14 (fourteen) days., Disp: , Rfl:    Cranberry 125 MG TABS, Take by mouth 2 (two) times daily., Disp: , Rfl:    fluconazole (DIFLUCAN) 150 MG tablet, Take 1 tablet (150 mg total) by mouth daily as needed (yeast infection)., Disp: 10 tablet, Rfl: 5   FLUoxetine (PROZAC) 40 MG capsule, TAKE 1 CAPSULE BY MOUTH EVERY DAY, Disp: 90 capsule, Rfl: 0   gabapentin (NEURONTIN) 100 MG capsule, Take 1 capsule (100 mg total) by mouth 3 (three) times daily., Disp: 90 capsule, Rfl: 3   HUMULIN R U-500 KWIKPEN 500 UNIT/ML KwikPen, Use 120 units at breakfast, 100 at lunch, and 100 at dinner, Disp: 1 mL, Rfl: 0   linaclotide (LINZESS) 145 MCG CAPS capsule, Take 1 capsule (145 mcg total) by mouth daily before breakfast., Disp: 12 capsule, Rfl: 0   montelukast (SINGULAIR) 10 MG tablet, TAKE 1 TABLET BY MOUTH EVERYDAY AT BEDTIME, Disp: 30 tablet, Rfl: 0   nortriptyline (PAMELOR) 10 MG capsule, Take 3 capsules (30 mg total) by mouth at bedtime., Disp: 270 capsule, Rfl: 3   omeprazole (PRILOSEC) 40 MG capsule, Take 40 mg by mouth in the morning., Disp: , Rfl:    ondansetron (ZOFRAN-ODT) 4 MG disintegrating tablet, Take 1 tablet (4 mg total) by mouth every 8 (eight) hours as needed for nausea or vomiting., Disp: 20 tablet, Rfl: 0   propranolol (INDERAL) 60 MG tablet, Take 1 tablet (60 mg total) by mouth 2 (two) times daily., Disp: 60 tablet, Rfl: 5   tirzepatide (MOUNJARO) 5 MG/0.5ML Pen, Inject 5 mg into the skin once a week., Disp: 2 mL,  Rfl: 0   Vitamin D, Ergocalciferol, (DRISDOL) 1.25 MG (50000 UNIT) CAPS capsule, Take 1 capsule (50,000 Units total) by mouth every 7 (seven) days., Disp: 4 capsule, Rfl: 0   docusate sodium (COLACE) 100 MG capsule, Take 1 capsule (100 mg total) by mouth 2 (two) times daily as needed for mild constipation. (Patient not taking: Reported on 05/03/2022), Disp: 20 capsule, Rfl: 0   doxycycline (VIBRAMYCIN) 100 MG capsule, Take 1 capsule (100 mg total) by mouth 2 (two) times daily. (Patient not taking: Reported on 05/03/2022), Disp: 14 capsule, Rfl: 0   Vital signs in last 24 hrs: Vitals:   05/03/22 1454  BP: 122/72  Pulse: 83   Wt Readings from Last 3 Encounters:  05/03/22 218 lb (98.9 kg)  04/21/22 226 lb (102.5 kg)  04/14/22 234 lb (106.1 kg)    Physical Exam  HEENT: sclera anicteric, oral mucosa moist without lesions Neck: supple, no thyromegaly, JVD or lymphadenopathy Cardiac: Regular without appreciable murmur,  no peripheral edema Pulm: clear to auscultation bilaterally, normal RR and effort noted Abdomen: soft, no tenderness, with active  bowel sounds.  Difficult to assess hepatosplenomegaly due to body habitus. Skin; warm and dry, no jaundice or rash  Labs:     Latest Ref Rng & Units 03/23/2022    9:19 AM 10/29/2021    8:48 AM 06/08/2021    9:10 AM  CBC  WBC 4.0 - 10.5 K/uL 4.0  2.8  3.6   Hemoglobin 12.0 - 15.0 g/dL 94.8  54.6  27.0   Hematocrit 36.0 - 46.0 % 39.5  40.5  40.0   Platelets 150.0 - 400.0 K/uL 91.0  83  91       Latest Ref Rng & Units 03/23/2022    9:19 AM 10/29/2021    8:48 AM 07/22/2021    9:37 AM  CMP  Glucose 70 - 99 mg/dL 350  093    BUN 6 - 23 mg/dL 7  4    Creatinine 8.18 - 1.20 mg/dL 2.99  3.71  6.96   Sodium 135 - 145 mEq/L 135  135    Potassium 3.5 - 5.1 mEq/L 3.4  3.9    Chloride 96 - 112 mEq/L 101  100    CO2 19 - 32 mEq/L 25  25    Calcium 8.4 - 10.5 mg/dL 8.9  9.3    Total Protein 6.0 - 8.3 g/dL 7.1  7.3    Total Bilirubin 0.2 - 1.2 mg/dL  1.3  0.8    Alkaline Phos 39 - 117 U/L 100     AST 0 - 37 U/L 41  53    ALT 0 - 35 U/L 32  36     03/23/2022:    AFP 5.8,     INR 1.3  MELD 3.0: 12 at 03/23/2022  9:19 AM MELD-Na: 10 at 03/23/2022  9:19 AM Calculated from: Serum Creatinine: 0.46 mg/dL (Using min of 1 mg/dL) at 78/93/8101  7:51 AM Serum Sodium: 135 mEq/L at 03/23/2022  9:19 AM Total Bilirubin: 1.3 mg/dL at 02/58/5277  8:24 AM Serum Albumin: 3.8 g/dL (Using max of 3.5 g/dL) at 23/53/6144  3:15 AM INR(ratio): 1.3 ratio at 03/23/2022  9:19 AM Age at listing (hypothetical): 41 years Sex: Female at 03/23/2022  9:19 AM   ___________________________________________ Radiologic studies:  CLINICAL DATA:  HCC Screening   NASH   EXAM: DUPLEX ULTRASOUND OF LIVER   TECHNIQUE: Color and duplex Doppler ultrasound was performed to evaluate the hepatic in-flow and out-flow vessels.   COMPARISON:  CT abdomen pelvis 07/22/2021   FINDINGS: Liver: Diffuse heterogeneity of the parenchyma. Mild nodularity hepatic contours.   No focal lesion, mass or intrahepatic biliary ductal dilatation.   Gallbladder: Minimal cholelithiasis.  Otherwise unremarkable.   Main Portal Vein size: 1.5 cm   Portal Vein Velocities   Main Prox:  18 cm/sec   Main Dist:  23 cm/sec Right: 20 cm/sec Left: 18 cm/sec   Hepatic Vein Velocities   Right:  46 cm/sec   Middle:  28 cm/sec   Left:  30 cm/sec   IVC: Present and patent with normal respiratory phasicity.   Hepatic Artery Velocity:  54 cm/sec   Splenic Vein Velocity:  80 cm/sec   Spleen: 20 cm x 15 cm x 20 cm with a total volume of 3,836 cm^3 (411 cm^3 is upper limit normal)   Portal Vein Occlusion/Thrombus: No   Splenic Vein Occlusion/Thrombus: No   Ascites: None   Varices: None   IMPRESSION: 1. Cirrhotic liver morphology without focal hepatic lesion. 2. Splenomegaly. 3. Patent portal vein with appropriate direction of  flow.     Electronically Signed   By: Acquanetta Belling M.D.   On: 03/29/2022 08:34   ____________________________________________ Other:   _____________________________________________ Assessment & Plan  Assessment: Encounter Diagnoses  Name Primary?   Chronic constipation Yes   Liver cirrhosis secondary to NASH (nonalcoholic steatohepatitis) (HCC)    Secondary esophageal varices without bleeding (HCC)    Longstanding chronic constipation that is most likely made worse by GLP-1 agonist medication.  However, she feels the weight loss benefits of the medicine are such that she would like to continue it if she can manage the constipation.  It sounds that she does reasonably well with that on current therapy, but sometimes feels unwell if she goes 2 to 3 days without a BM.  I gave her samples of Linzess to take as needed (145 mcg)  She also says a friend had undergone some constipation "therapy" that may have included some abdominal massage.  I am not familiar with that or no local provider for that.  She was going to talk with her friend and see if she can get a referral. She has not previously had anorectal manometry, so that could be pursued but we currently have a long delay getting that scheduled due to staffing issues in our hospital endoscopy department.  Regarding her cirrhosis, the condition is stable and she currently does not need to be evaluated by liver transplant clinic. Esophageal varices are on nonselective beta-blocker for primary prophylaxis, she does not have a history of GI bleeding, ascites or hepatic encephalopathy.  She is also recently up-to-date on hepatoma screening.  Therefore, I will see her back in about 3 months for follow-up or sooner if needed.    Charlie Pitter III

## 2022-05-03 NOTE — Patient Instructions (Signed)
_______________________________________________________  If your blood pressure at your visit was 140/90 or greater, please contact your primary care physician to follow up on this.  _______________________________________________________  If you are age 42 or older, your body mass index should be between 23-30. Your Body mass index is 35.19 kg/m. If this is out of the aforementioned range listed, please consider follow up with your Primary Care Provider.  If you are age 89 or younger, your body mass index should be between 19-25. Your Body mass index is 35.19 kg/m. If this is out of the aformentioned range listed, please consider follow up with your Primary Care Provider.   ________________________________________________________  The  GI providers would like to encourage you to use [facility] to communicate with providers for non-urgent requests or questions.  Due to long hold times on the telephone, sending your provider a message by [facility] may be a faster and more efficient way to get a response.  Please allow 48 business hours for a response.  Please remember that this is for non-urgent requests.  _______________________________________________________   We have given you samples of the following medication to take:  Linzess 145 mcg one a day # 103 Linzess works best when taken once a day every day, on an empty stomach, at least 30 minutes before your first meal of the day.  When Linzess is taken daily as directed:  *Constipation relief is typically felt in about a week *IBS-C patients may begin to experience relief from belly pain and overall abdominal symptoms (pain, discomfort, and bloating) in about 1 week,   with symptoms typically improving over 12 weeks.  Diarrhea may occur in the first 2 weeks -keep taking it.  The diarrhea should go away and you should start having normal, complete, full bowel movements. It may be helpful to start treatment when you can be near the  comfort of your own bathroom, such as a weekend.    It was a pleasure to see you today!  Thank you for trusting me with your gastrointestinal care!

## 2022-05-05 ENCOUNTER — Other Ambulatory Visit: Payer: Self-pay | Admitting: Nurse Practitioner

## 2022-05-05 DIAGNOSIS — Z794 Long term (current) use of insulin: Secondary | ICD-10-CM

## 2022-05-06 ENCOUNTER — Ambulatory Visit: Payer: BC Managed Care – PPO | Admitting: Nurse Practitioner

## 2022-05-06 ENCOUNTER — Encounter: Payer: Self-pay | Admitting: Nurse Practitioner

## 2022-05-06 VITALS — BP 101/62 | HR 77 | Temp 97.9°F | Ht 66.0 in | Wt 226.0 lb

## 2022-05-06 DIAGNOSIS — Z6836 Body mass index (BMI) 36.0-36.9, adult: Secondary | ICD-10-CM | POA: Diagnosis not present

## 2022-05-06 DIAGNOSIS — E1169 Type 2 diabetes mellitus with other specified complication: Secondary | ICD-10-CM | POA: Diagnosis not present

## 2022-05-06 DIAGNOSIS — Z794 Long term (current) use of insulin: Secondary | ICD-10-CM

## 2022-05-06 DIAGNOSIS — E669 Obesity, unspecified: Secondary | ICD-10-CM | POA: Diagnosis not present

## 2022-05-06 DIAGNOSIS — Z7985 Long-term (current) use of injectable non-insulin antidiabetic drugs: Secondary | ICD-10-CM

## 2022-05-10 ENCOUNTER — Encounter: Payer: Self-pay | Admitting: Gastroenterology

## 2022-05-11 ENCOUNTER — Other Ambulatory Visit: Payer: Self-pay | Admitting: Adult Health

## 2022-05-11 ENCOUNTER — Telehealth: Payer: Self-pay

## 2022-05-11 ENCOUNTER — Other Ambulatory Visit: Payer: Self-pay

## 2022-05-11 DIAGNOSIS — J988 Other specified respiratory disorders: Secondary | ICD-10-CM

## 2022-05-11 MED ORDER — LINACLOTIDE 145 MCG PO CAPS: 145.0000 ug | ORAL_CAPSULE | Freq: Every day | ORAL | 0 refills | Status: DC

## 2022-05-11 NOTE — Telephone Encounter (Signed)
Patient is calling for update on Linzess RX. Please advise

## 2022-05-11 NOTE — Telephone Encounter (Signed)
Patient called in and states she talked to her diabetic doctor and they gave her the okay to go up in her dose for Mounjaro to 7mg . Patient would like to see if it can be sent in. Patient was seen on 05/06/22 by Colletta Maryland.

## 2022-05-12 ENCOUNTER — Other Ambulatory Visit: Payer: Self-pay | Admitting: Nurse Practitioner

## 2022-05-12 ENCOUNTER — Telehealth: Payer: Self-pay | Admitting: Gastroenterology

## 2022-05-12 ENCOUNTER — Encounter: Payer: Self-pay | Admitting: Nurse Practitioner

## 2022-05-12 ENCOUNTER — Telehealth: Payer: Self-pay

## 2022-05-12 DIAGNOSIS — E1169 Type 2 diabetes mellitus with other specified complication: Secondary | ICD-10-CM

## 2022-05-12 MED ORDER — LINACLOTIDE 145 MCG PO CAPS: 145.0000 ug | ORAL_CAPSULE | Freq: Every day | ORAL | 0 refills | Status: DC

## 2022-05-12 MED ORDER — TIRZEPATIDE 7.5 MG/0.5ML ~~LOC~~ SOAJ: 7.5000 mg | SUBCUTANEOUS | 0 refills | Status: DC

## 2022-05-12 NOTE — Telephone Encounter (Signed)
Refill has been resent to [facility] as requested

## 2022-05-12 NOTE — Telephone Encounter (Signed)
PA started through Cover My Meds

## 2022-05-12 NOTE — Addendum Note (Signed)
Addended by: Elias Else on: 05/12/2022 03:23 PM   Modules accepted: Orders

## 2022-05-12 NOTE — Telephone Encounter (Signed)
PA submitted through Cover My Meds for [facility]. Awaiting insurance determination.  Key: Carlean Purl

## 2022-05-12 NOTE — Telephone Encounter (Signed)
Patient is calling states [facility] has not received her Linzess Rx. Please advise

## 2022-05-13 NOTE — Progress Notes (Signed)
Chief Complaint:   OBESITY Laurie Sutton is here to discuss her progress with her obesity treatment plan along with follow-up of her obesity related diagnoses. Laurie Sutton is on keeping a food journal and adhering to recommended goals of 1700-1800 calories and ? protein and states she is following her eating plan approximately 0% of the time. Laurie Sutton states she is exercising 0 minutes 0 times per week.  Today's visit was #: 62 Starting weight: 230 lbs Starting date: 06/09/2021 Today's weight: 226 lbs Today's date: 05/06/2022 Total lbs lost to date: 4 lbs Total lbs lost since last in-office visit: 0  Interim History: Laurie Sutton's daughter has been sick since her last visit.  She has not been able to follow the plan or exercise.  Struggles with hunger and cravings.  Notes this is the lowest weight she has been in a long time.  Subjective:   1. Type 2 diabetes mellitus with other specified complication, with long-term current use of insulin (HCC) Taking Mounjaro 5 mg.  She is also taking Humulin 60-90 units.  Breakfast dose is based upon what she eats-60 units lunch and 50 units for dinner.  Occasional side effects of nausea.  Takes Zofran as needed.  Blood sugars 70-250.  Assessment/Plan:   1. Type 2 diabetes mellitus with other specified complication, with long-term current use of insulin ([place]) To contact Endo about Mounjaro dose.  Let me know next week and I will send in prescription.  2. BMI 36.0-36.9,adult Laurie Sutton is currently in the action stage of change. As such, her goal is to continue with weight loss efforts. She has agreed to keeping a food journal and adhering to recommended goals of 1800 calories and 100+ grams of protein.   Start back on meal plan and exercise.  Plans to try to eat several small meals per day.  Exercise goals: All adults should avoid inactivity. Some physical activity is better than none, and adults who participate in any amount of physical activity gain some health  benefits.  Behavioral modification strategies: increasing lean protein intake, increasing vegetables, and increasing water intake.  Laurie Sutton has agreed to follow-up with our clinic in 4 weeks. She was informed of the importance of frequent follow-up visits to maximize her success with intensive lifestyle modifications for her multiple health conditions.   Objective:   Blood pressure 101/62, pulse 77, temperature 97.9 F (36.6 C), height 5\' 6"  (1.676 m), weight 226 lb (102.5 kg), last menstrual period 11/13/2020, SpO2 98 %. Body mass index is 36.48 kg/m.  General: Cooperative, alert, well developed, in no acute distress. HEENT: Conjunctivae and lids unremarkable. Cardiovascular: Regular rhythm.  Lungs: Normal work of breathing. Neurologic: No focal deficits.   Lab Results  Component Value Date   CREATININE 0.46 03/23/2022   BUN 7 03/23/2022   NA 135 03/23/2022   K 3.4 (L) 03/23/2022   CL 101 03/23/2022   CO2 25 03/23/2022   Lab Results  Component Value Date   ALT 32 03/23/2022   AST 41 (H) 03/23/2022   ALKPHOS 100 03/23/2022   BILITOT 1.3 (H) 03/23/2022   Lab Results  Component Value Date   HGBA1C 9.1 (A) 04/08/2022   HGBA1C 9.6 04/27/2019   HGBA1C 6.2 08/19/2017   HGBA1C 6.9 (H) 06/07/2016   HGBA1C 7.5 (H) 12/30/2015   No results found for: "INSULIN" Lab Results  Component Value Date   TSH 1.170 06/09/2021   Lab Results  Component Value Date   CHOL 169 08/19/2017   HDL 38  08/19/2017   LDLCALC 91 08/19/2017   TRIG 202 (A) 08/19/2017   CHOLHDL 4 05/12/2015   Lab Results  Component Value Date   VD25OH 34 10/29/2021   VD25OH 12.8 (L) 06/09/2021   Lab Results  Component Value Date   WBC 4.0 03/23/2022   HGB 13.3 03/23/2022   HCT 39.5 03/23/2022   MCV 84.3 03/23/2022   PLT 91.0 (L) 03/23/2022   No results found for: "IRON", "TIBC", "FERRITIN"  Attestation Statements:   Reviewed by clinician on day of visit: allergies, medications, problem list, medical  history, surgical history, family history, social history, and previous encounter notes.  Time spent on visit including pre-visit chart review and post-visit care and charting was 30 minutes.   I, Brendell Tyus, RMA, am acting as transcriptionist for Everardo Pacific, FNP.  I have reviewed the above documentation for accuracy and completeness, and I agree with the above. Everardo Pacific, FNP

## 2022-05-17 MED ORDER — OMEPRAZOLE 40 MG PO CPDR: 40.0000 mg | DELAYED_RELEASE_CAPSULE | Freq: Every morning | ORAL | 1 refills | Status: DC

## 2022-05-18 ENCOUNTER — Other Ambulatory Visit: Payer: Self-pay | Admitting: Family Medicine

## 2022-05-18 DIAGNOSIS — J309 Allergic rhinitis, unspecified: Secondary | ICD-10-CM

## 2022-05-19 ENCOUNTER — Other Ambulatory Visit: Payer: Self-pay

## 2022-05-19 MED ORDER — LINACLOTIDE 145 MCG PO CAPS: 145.0000 ug | ORAL_CAPSULE | Freq: Every day | ORAL | 0 refills | Status: DC

## 2022-05-25 NOTE — Telephone Encounter (Signed)
Laurie Sutton has been denied by patients insurance:  Coverage for this medication is denied for the following reason(s). We reviewed the information we received about your condition and circumstances. We used plan approved criteria when making this decision. The policy states that this medication may be approved when: - The member is unable to take the required number of formulary alternatives for the given diagnosis due to an intolerance or contraindication OR - The member has tried and failed the required number of formulary alternatives. Based on the policy and the information we received your request is denied. We did not receive documentation that you meet the criteria outlined above. Formulary alternatives are: Ozempic, Rybelsus, Trulicity, Victoza. Requirement is 3 in a class of 3 or more.

## 2022-06-01 ENCOUNTER — Encounter: Payer: Self-pay | Admitting: Family Medicine

## 2022-06-01 ENCOUNTER — Ambulatory Visit: Payer: BC Managed Care – PPO | Admitting: Family Medicine

## 2022-06-01 VITALS — BP 110/74 | HR 75 | Temp 97.9°F | Wt 229.0 lb

## 2022-06-01 DIAGNOSIS — J039 Acute tonsillitis, unspecified: Secondary | ICD-10-CM | POA: Diagnosis not present

## 2022-06-01 DIAGNOSIS — J029 Acute pharyngitis, unspecified: Secondary | ICD-10-CM

## 2022-06-01 DIAGNOSIS — R059 Cough, unspecified: Secondary | ICD-10-CM | POA: Diagnosis not present

## 2022-06-01 LAB — POCT INFLUENZA A/B
Influenza A, POC: NEGATIVE
Influenza B, POC: NEGATIVE

## 2022-06-01 LAB — POC COVID19 BINAXNOW: SARS Coronavirus 2 Ag: NEGATIVE

## 2022-06-01 LAB — POCT RAPID STREP A (OFFICE): Rapid Strep A Screen: NEGATIVE

## 2022-06-01 MED ORDER — CEFUROXIME AXETIL 500 MG PO TABS: 500.0000 mg | ORAL_TABLET | Freq: Two times a day (BID) | ORAL | 0 refills | Status: AC

## 2022-06-01 MED ORDER — ALPRAZOLAM 1 MG PO TABS: 1.0000 mg | ORAL_TABLET | Freq: Two times a day (BID) | ORAL | 5 refills | Status: DC | PRN

## 2022-06-01 NOTE — Progress Notes (Signed)
   Subjective:    Patient ID: Laurie Sutton, female    DOB: April 03, 1981, 42 y.o.   MRN: [id number]  HPI Here for 5 days of a ST on the left side of her throat, headaches, and chills. No cough.    Review of Systems  Constitutional:  Positive for chills. Negative for diaphoresis and fever.  HENT:  Positive for congestion and sore throat. Negative for ear pain, postnasal drip, sinus pressure and trouble swallowing.   Eyes: Negative.   Respiratory: Negative.         Objective:   Physical Exam Constitutional:      Appearance: Normal appearance.  HENT:     Right Ear: Tympanic membrane, ear canal and external ear normal.     Left Ear: Tympanic membrane, ear canal and external ear normal.     Nose: Nose normal.     Mouth/Throat:     Pharynx: Oropharynx is clear. No oropharyngeal exudate or posterior oropharyngeal erythema.  Eyes:     Conjunctiva/sclera: Conjunctivae normal.  Neck:     Comments: Tender AC nodes on the left side  Pulmonary:     Effort: Pulmonary effort is normal.     Breath sounds: Normal breath sounds.  Neurological:     Mental Status: She is alert.           Assessment & Plan:  Tonsillitis, treat with 10 days of Cefuroxime.  Alysia Penna, MD

## 2022-06-07 ENCOUNTER — Encounter: Payer: Self-pay | Admitting: Gastroenterology

## 2022-06-08 ENCOUNTER — Encounter: Payer: Self-pay | Admitting: Gastroenterology

## 2022-06-08 MED ORDER — LINACLOTIDE 145 MCG PO CAPS: 145.0000 ug | ORAL_CAPSULE | Freq: Every day | ORAL | 0 refills | Status: DC

## 2022-06-10 ENCOUNTER — Telehealth: Payer: Self-pay

## 2022-06-10 ENCOUNTER — Ambulatory Visit: Payer: BC Managed Care – PPO | Admitting: Nurse Practitioner

## 2022-06-10 ENCOUNTER — Other Ambulatory Visit: Payer: Self-pay | Admitting: Nurse Practitioner

## 2022-06-10 DIAGNOSIS — E1169 Type 2 diabetes mellitus with other specified complication: Secondary | ICD-10-CM

## 2022-06-10 MED ORDER — TIRZEPATIDE 7.5 MG/0.5ML ~~LOC~~ SOAJ: 7.5000 mg | SUBCUTANEOUS | 0 refills | Status: DC

## 2022-06-10 NOTE — Telephone Encounter (Signed)
Please advise if ok to fill?  

## 2022-06-10 NOTE — Telephone Encounter (Signed)
Patient was scheduled for an appointment for today but she had a fever and not feeling well. Patient would like to know if her tirzepatide [facility]) 7.5 MG/0.5ML Pen  could be refilled. Did let patient know that we usually do not refill between visits but I would send a message back.

## 2022-06-13 ENCOUNTER — Other Ambulatory Visit: Payer: Self-pay | Admitting: Nurse Practitioner

## 2022-06-13 DIAGNOSIS — E1169 Type 2 diabetes mellitus with other specified complication: Secondary | ICD-10-CM

## 2022-06-15 ENCOUNTER — Other Ambulatory Visit: Payer: Self-pay | Admitting: Nurse Practitioner

## 2022-06-15 DIAGNOSIS — Z794 Long term (current) use of insulin: Secondary | ICD-10-CM

## 2022-06-15 MED ORDER — TIRZEPATIDE 5 MG/0.5ML ~~LOC~~ SOAJ: 5.0000 mg | SUBCUTANEOUS | 0 refills | Status: DC

## 2022-06-15 NOTE — Telephone Encounter (Signed)
LVM for patient to call back and r/s from her last appointment.

## 2022-06-21 ENCOUNTER — Encounter: Payer: Self-pay | Admitting: Family Medicine

## 2022-06-29 ENCOUNTER — Other Ambulatory Visit: Payer: Self-pay | Admitting: Family Medicine

## 2022-07-04 ENCOUNTER — Other Ambulatory Visit: Payer: Self-pay | Admitting: Nurse Practitioner

## 2022-07-04 DIAGNOSIS — R110 Nausea: Secondary | ICD-10-CM

## 2022-07-08 ENCOUNTER — Telehealth (INDEPENDENT_AMBULATORY_CARE_PROVIDER_SITE_OTHER): Payer: BC Managed Care – PPO | Admitting: Family Medicine

## 2022-07-08 ENCOUNTER — Encounter (INDEPENDENT_AMBULATORY_CARE_PROVIDER_SITE_OTHER): Payer: Self-pay

## 2022-07-08 ENCOUNTER — Telehealth (INDEPENDENT_AMBULATORY_CARE_PROVIDER_SITE_OTHER): Payer: Self-pay | Admitting: Family Medicine

## 2022-07-08 ENCOUNTER — Encounter: Payer: Self-pay | Admitting: Nurse Practitioner

## 2022-07-08 ENCOUNTER — Ambulatory Visit: Payer: BC Managed Care – PPO | Admitting: Nurse Practitioner

## 2022-07-08 ENCOUNTER — Encounter (INDEPENDENT_AMBULATORY_CARE_PROVIDER_SITE_OTHER): Payer: Self-pay | Admitting: Family Medicine

## 2022-07-08 DIAGNOSIS — E669 Obesity, unspecified: Secondary | ICD-10-CM

## 2022-07-08 NOTE — Telephone Encounter (Signed)
LVM for patient to call back and move appt down so she can still be seen by dawn.

## 2022-07-08 NOTE — Telephone Encounter (Signed)
Patient was a no-show for virtual visit. Left VM advising patient to call to reschedule.  I offered her the option to reschedule for later today.

## 2022-07-08 NOTE — Progress Notes (Signed)
Patient joined the virtual visit and then left before I joined the visit.  She did not rejoin for her scheduled appointment time. Georgianne Fick, FNP

## 2022-07-14 ENCOUNTER — Ambulatory Visit: Payer: BC Managed Care – PPO | Admitting: Family Medicine

## 2022-07-14 ENCOUNTER — Encounter: Payer: Self-pay | Admitting: Family Medicine

## 2022-07-14 VITALS — BP 110/78 | HR 87 | Temp 98.0°F | Wt 229.0 lb

## 2022-07-14 DIAGNOSIS — J0100 Acute maxillary sinusitis, unspecified: Secondary | ICD-10-CM

## 2022-07-14 MED ORDER — DOXYCYCLINE HYCLATE 100 MG PO CAPS: 100.0000 mg | ORAL_CAPSULE | Freq: Two times a day (BID) | ORAL | 0 refills | Status: AC

## 2022-07-14 NOTE — Progress Notes (Signed)
   Subjective:    Patient ID: Laurie Sutton, female    DOB: 1980/09/09, 42 y.o.   MRN: [id number]  HPI Here for 2 weeks of pressure and pain in the right cheek area. No fever or headache or ST or cough. While at her dentist a few days ago, they took Xrays that showed her right maxillary sinus to be full of fluid.    Review of Systems  Constitutional: Negative.   HENT:  Positive for congestion and sinus pain. Negative for ear pain, postnasal drip, sneezing and sore throat.   Eyes: Negative.   Respiratory: Negative.         Objective:   Physical Exam Constitutional:      Appearance: Normal appearance. She is not ill-appearing.  HENT:     Right Ear: Tympanic membrane, ear canal and external ear normal.     Left Ear: Tympanic membrane, ear canal and external ear normal.     Nose: Nose normal.     Mouth/Throat:     Pharynx: Oropharynx is clear.  Eyes:     Conjunctiva/sclera: Conjunctivae normal.  Pulmonary:     Effort: Pulmonary effort is normal.     Breath sounds: Normal breath sounds.  Lymphadenopathy:     Cervical: No cervical adenopathy.  Neurological:     Mental Status: She is alert.           Assessment & Plan:  Sinusitis, treat with 10 days of Doxycycline.  Alysia Penna, MD

## 2022-07-17 ENCOUNTER — Other Ambulatory Visit: Payer: Self-pay | Admitting: Family Medicine

## 2022-07-19 ENCOUNTER — Other Ambulatory Visit: Payer: Self-pay | Admitting: Nurse Practitioner

## 2022-07-19 DIAGNOSIS — E559 Vitamin D deficiency, unspecified: Secondary | ICD-10-CM

## 2022-07-21 ENCOUNTER — Encounter: Payer: Self-pay | Admitting: Nurse Practitioner

## 2022-07-21 ENCOUNTER — Ambulatory Visit: Payer: BC Managed Care – PPO | Admitting: Nurse Practitioner

## 2022-07-21 VITALS — BP 109/74 | HR 78 | Temp 98.1°F | Ht 66.0 in | Wt 221.0 lb

## 2022-07-21 DIAGNOSIS — E1169 Type 2 diabetes mellitus with other specified complication: Secondary | ICD-10-CM | POA: Diagnosis not present

## 2022-07-21 DIAGNOSIS — Z7985 Long-term (current) use of injectable non-insulin antidiabetic drugs: Secondary | ICD-10-CM

## 2022-07-21 DIAGNOSIS — Z794 Long term (current) use of insulin: Secondary | ICD-10-CM | POA: Diagnosis not present

## 2022-07-21 DIAGNOSIS — Z6835 Body mass index (BMI) 35.0-35.9, adult: Secondary | ICD-10-CM

## 2022-07-21 DIAGNOSIS — E669 Obesity, unspecified: Secondary | ICD-10-CM | POA: Diagnosis not present

## 2022-07-21 MED ORDER — TIRZEPATIDE 7.5 MG/0.5ML ~~LOC~~ SOAJ
7.5000 mg | SUBCUTANEOUS | 0 refills | Status: DC
Start: 2022-07-21 — End: 2022-08-19

## 2022-07-21 NOTE — Progress Notes (Signed)
Office: [phone]  /  Fax: [phone]  WEIGHT SUMMARY AND BIOMETRICS  Weight Lost Since Last Visit: 5lb  No data recorded  Vitals Temp: 98.1 F (36.7 C) BP: 109/74 Pulse Rate: 78 SpO2: 98 %   Anthropometric Measurements Height: 5\' 6"  (1.676 m) Weight: 221 lb (100.2 kg) BMI (Calculated): 35.69 Weight at Last Visit: 226lb Weight Lost Since Last Visit: 5lb Starting Weight: 230lb Total Weight Loss (lbs): 9 lb (4.082 kg)   Body Composition  Body Fat %: 44 % Fat Mass (lbs): 97.4 lbs Muscle Mass (lbs): 117.6 lbs Total Body Water (lbs): 80.2 lbs Visceral Fat Rating : 11   Other Clinical Data Fasting: No Today's Visit #: 17 Starting Date: 06/09/21     HPI  Chief Complaint: OBESITY  Laurie Sutton is here to discuss her progress with her obesity treatment plan. She is on the keeping a food journal and adhering to recommended goals of 1800 calories and 100+ protein and states she is following her eating plan approximately 40 % of the time. She states she is exercising 30 minutes 5 days per week.   Interval History:  Since last office visit she has lost 5 pounds.  She was seen in office last on 05/06/22.    Pharmacotherapy for weight loss: She is not currently taking medications  for medical weight loss.   Previous pharmacotherapy for medical weight loss:  She thinks she took Phentermine in the past.    Bariatric surgery:  Patient has not had bariatric surgery.  Was interested but was not consider a candidate.  If she decides she would like to proceed with surgery, will send to [facility] for further evaluation.      Pharmacotherapy for DMT2:  She is currently taking Mounjaro 5mg  (has been prescribed 7.5mg  but hasn't been able to find due to shortage).  Denies side effects.   She is also taking Humulin 60-90 units.  Breakfast dose is 90 or takes 45 units if she skips breakfast, lunch 60 units and dinner is 50 units.  Reports polyphagia and cravings.  Last A1c was 9.1 with  Dr. Clent Ridges. Reports last A1c was 8.8 & BMP with endo on 06/29/22 CBGs: Fasting 200+ Episodes of hypoglycemia: no She is not on ACE or ARB, ASA 81mg  or statin.  Last eye exam:  2023    Lab Results  Component Value Date   HGBA1C 9.1 (A) 04/08/2022   HGBA1C 9.6 04/27/2019   HGBA1C 6.2 08/19/2017   Lab Results  Component Value Date   MICROALBUR 0.93 04/27/2019   LDLCALC 91 08/19/2017   CREATININE 0.46 03/23/2022     PHYSICAL EXAM:  Blood pressure 109/74, pulse 78, temperature 98.1 F (36.7 C), height 5\' 6"  (1.676 m), weight 221 lb (100.2 kg), last menstrual period 11/13/2020, SpO2 98 %. Body mass index is 35.67 kg/m.  General: She is overweight, cooperative, alert, well developed, and in no acute distress. PSYCH: Has normal mood, affect and thought process.   Extremities: No edema.  Neurologic: No gross sensory or motor deficits. No tremors or fasciculations noted.    DIAGNOSTIC DATA REVIEWED:  BMET    Component Value Date/Time   NA 135 03/23/2022 0919   NA 138 08/19/2017 0000   K 3.4 (L) 03/23/2022 0919   CL 101 03/23/2022 0919   CO2 25 03/23/2022 0919   GLUCOSE 191 (H) 03/23/2022 0919   BUN 7 03/23/2022 0919   BUN 8 08/19/2017 0000   CREATININE 0.46 03/23/2022 0919   CREATININE 0.42 (L)  10/29/2021 0848   CALCIUM 8.9 03/23/2022 0919   GFRNONAA >60 06/08/2021 0910   GFRAA >60 08/09/2019 0459   Lab Results  Component Value Date   HGBA1C 9.1 (A) 04/08/2022   HGBA1C 12.0 (H) 11/12/2013   No results found for: "INSULIN" Lab Results  Component Value Date   TSH 1.170 06/09/2021   CBC    Component Value Date/Time   WBC 4.0 03/23/2022 0919   RBC 4.69 03/23/2022 0919   HGB 13.3 03/23/2022 0919   HCT 39.5 03/23/2022 0919   PLT 91.0 (L) 03/23/2022 0919   MCV 84.3 03/23/2022 0919   MCH 28.6 10/29/2021 0848   MCHC 33.6 03/23/2022 0919   RDW 16.2 (H) 03/23/2022 0919   Iron Studies No results found for: "IRON", "TIBC", "FERRITIN", "IRONPCTSAT" Lipid Panel      Component Value Date/Time   CHOL 169 08/19/2017 0000   TRIG 202 (A) 08/19/2017 0000   HDL 38 08/19/2017 0000   CHOLHDL 4 05/12/2015 1042   VLDL 37.8 05/12/2015 1042   LDLCALC 91 08/19/2017 0000   Hepatic Function Panel     Component Value Date/Time   PROT 7.1 03/23/2022 0919   ALBUMIN 3.8 03/23/2022 0919   AST 41 (H) 03/23/2022 0919   ALT 32 03/23/2022 0919   ALKPHOS 100 03/23/2022 0919   BILITOT 1.3 (H) 03/23/2022 0919   BILIDIR 0.2 04/27/2019 1223      Component Value Date/Time   TSH 1.170 06/09/2021 0915   Nutritional Lab Results  Component Value Date   VD25OH 34 10/29/2021   VD25OH 12.8 (L) 06/09/2021     ASSESSMENT AND PLAN  TREATMENT PLAN FOR OBESITY:  Recommended Dietary Goals  Laurie Sutton is currently in the action stage of change. As such, her goal is to continue weight management plan. She has agreed to keeping a food journal and adhering to recommended goals of 1800 calories and 100+ protein.  Behavioral Intervention  We discussed the following Behavioral Modification Strategies today: increasing lean protein intake, decreasing simple carbohydrates , increasing vegetables, avoiding skipping meals, increasing water intake, and work on meal planning and preparation.  Additional resources provided today: NA  Recommended Physical Activity Goals  Laurie Sutton has been advised to work up to 150 minutes of moderate intensity aerobic activity a week and strengthening exercises 2-3 times per week for cardiovascular health, weight loss maintenance and preservation of muscle mass.   She has agreed to Think about ways to increase physical activity and Work on scheduling and tracking physical activity.    Pharmacotherapy We discussed various medication options to help Laurie Sutton with her weight loss efforts and we both agreed to increase Mounjaro 7.5mg .  Side effects discussed.  ASSOCIATED CONDITIONS ADDRESSED TODAY  Action/Plan  Type 2 diabetes mellitus with other  specified complication, with long-term current use of insulin -     Tirzepatide; Inject 7.5 mg into the skin once a week.  Dispense: 2 mL; Refill: 0  Good blood sugar control is important to decrease the likelihood of diabetic complications such as nephropathy, neuropathy, limb loss, blindness, coronary artery disease, and death. Intensive lifestyle modification including diet, exercise and weight loss are the first line of treatment for diabetes.    Generalized obesity: Starting BMI 36  BMI 35.0-35.9,adult      To send me labs via mychart.   Return in about 4 weeks (around 08/18/2022).Marland Kitchen She was informed of the importance of frequent follow up visits to maximize her success with intensive lifestyle modifications for her multiple health  conditions.   ATTESTASTION STATEMENTS:  Reviewed by clinician on day of visit: allergies, medications, problem list, medical history, surgical history, family history, social history, and previous encounter notes.     Theodis SatoStephanie R. Faust Thorington FNP-C

## 2022-07-26 ENCOUNTER — Ambulatory Visit (INDEPENDENT_AMBULATORY_CARE_PROVIDER_SITE_OTHER): Payer: BC Managed Care – PPO | Admitting: Family Medicine

## 2022-07-26 ENCOUNTER — Encounter: Payer: Self-pay | Admitting: Nurse Practitioner

## 2022-07-26 ENCOUNTER — Other Ambulatory Visit: Payer: Self-pay | Admitting: Nurse Practitioner

## 2022-07-26 ENCOUNTER — Encounter: Payer: Self-pay | Admitting: Family Medicine

## 2022-07-26 VITALS — BP 110/64 | HR 79 | Temp 98.3°F | Wt 228.0 lb

## 2022-07-26 DIAGNOSIS — E1169 Type 2 diabetes mellitus with other specified complication: Secondary | ICD-10-CM

## 2022-07-26 DIAGNOSIS — J019 Acute sinusitis, unspecified: Secondary | ICD-10-CM | POA: Diagnosis not present

## 2022-07-26 MED ORDER — LEVOFLOXACIN 500 MG PO TABS: 500.0000 mg | ORAL_TABLET | Freq: Every day | ORAL | 0 refills | Status: AC

## 2022-07-26 NOTE — Progress Notes (Signed)
   Subjective:    Patient ID: Laurie Sutton, female    DOB: Mar 02, 1981, 42 y.o.   MRN: [id number]  HPI Here for continued sinus congestion and facial pain. No fever or ST or cough. She took a course of Doxycycline, and this helped for awhile, however now the symptoms are back.    Review of Systems  Constitutional: Negative.   HENT:  Positive for congestion, postnasal drip and sinus pressure. Negative for ear pain, facial swelling and sore throat.   Eyes: Negative.   Respiratory: Negative.         Objective:   Physical Exam Constitutional:      Appearance: Normal appearance.  HENT:     Right Ear: Tympanic membrane, ear canal and external ear normal.     Left Ear: Tympanic membrane, ear canal and external ear normal.     Nose: Nose normal.     Mouth/Throat:     Pharynx: Oropharynx is clear.  Eyes:     Conjunctiva/sclera: Conjunctivae normal.  Pulmonary:     Effort: Pulmonary effort is normal.     Breath sounds: Normal breath sounds.  Lymphadenopathy:     Cervical: No cervical adenopathy.  Neurological:     Mental Status: She is alert.           Assessment & Plan:  Partially treated sinusitis. This time she will take 10 days of Levaquin.  Gershon Crane, MD

## 2022-08-13 IMAGING — CT CT RENAL STONE PROTOCOL
2 of 3 series · 16 of 46 positions shown, 18 images · non-contrast
Comparison: CT abdomen, dated November 26, 2020 and CT urogram, dated
June 03, 2016

CLINICAL DATA: Left flank pain.



[Series 4: lung bases · axial · 0.66mm/px · z∈[-220,-112]mm · 13 of 63 slices shown, 15 images]
[im 5/63  soft-tissue]
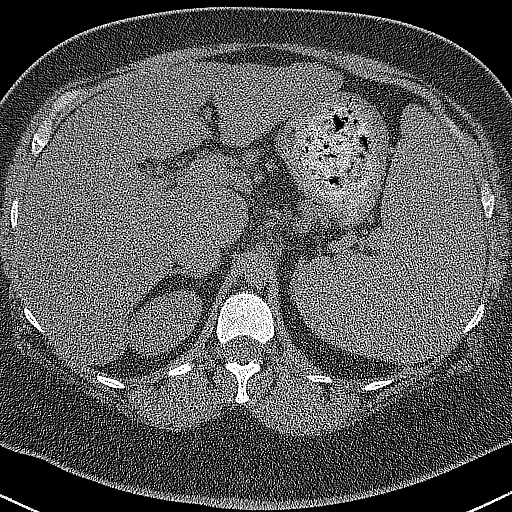
[im 5/63  bone]
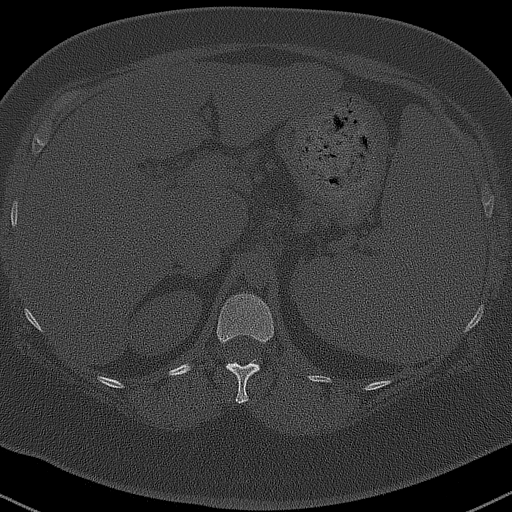
[im 9/63  soft-tissue]
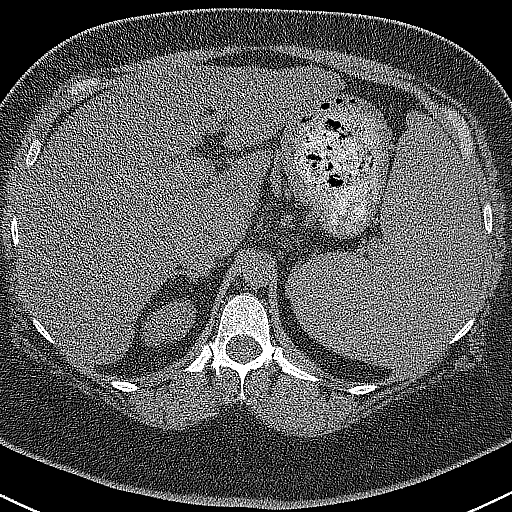
[im 13/63  soft-tissue]
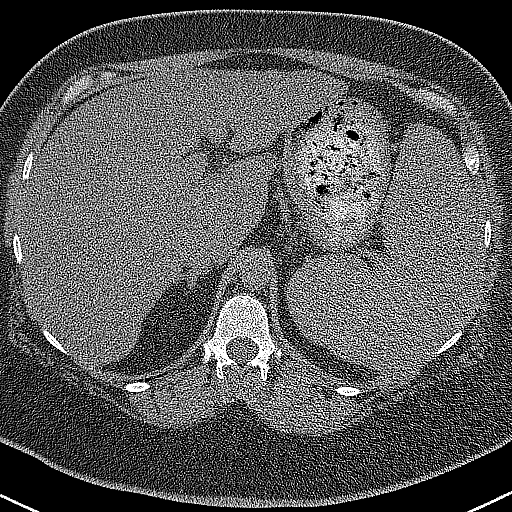
[im 19/63  soft-tissue]
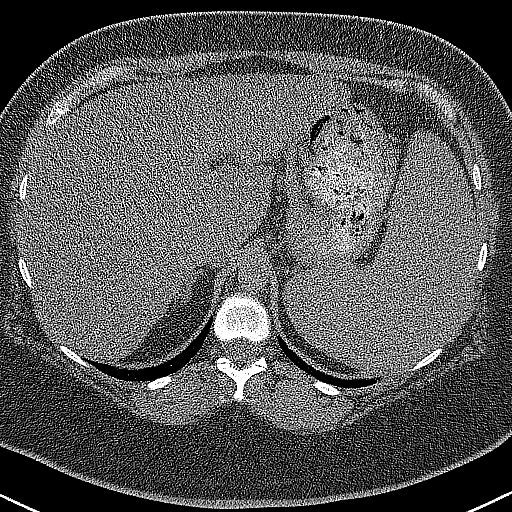
[im 23/63  soft-tissue]
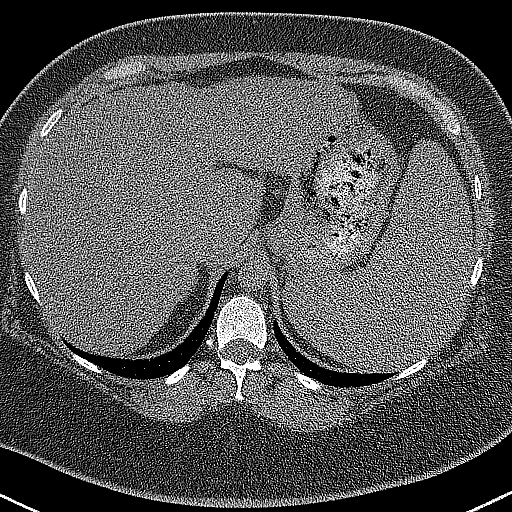
[im 27/63  soft-tissue]
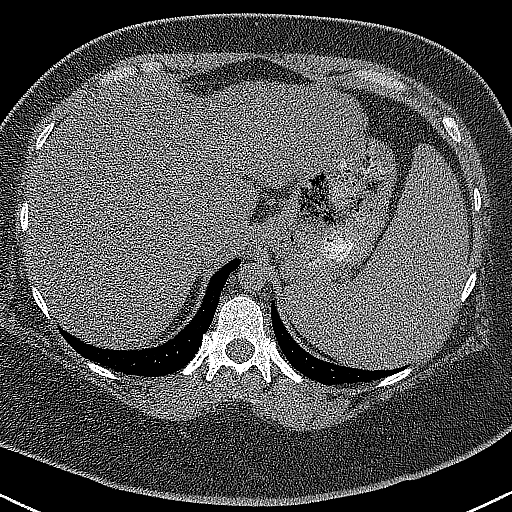
[im 33/63  soft-tissue]
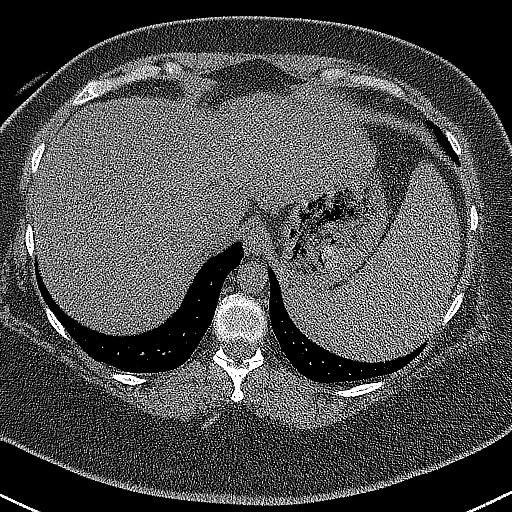
[im 37/63  soft-tissue]
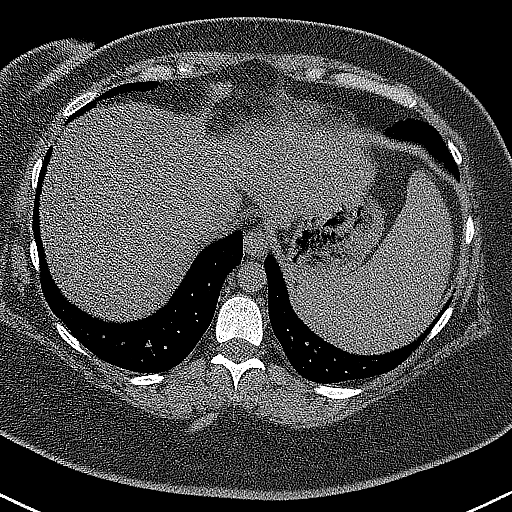
[im 41/63  soft-tissue]
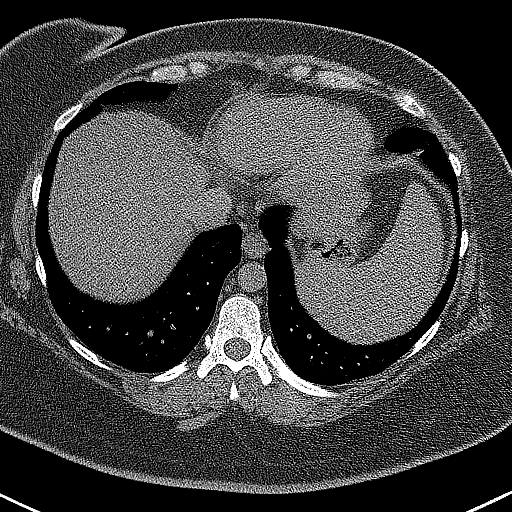
[im 41/63  bone]
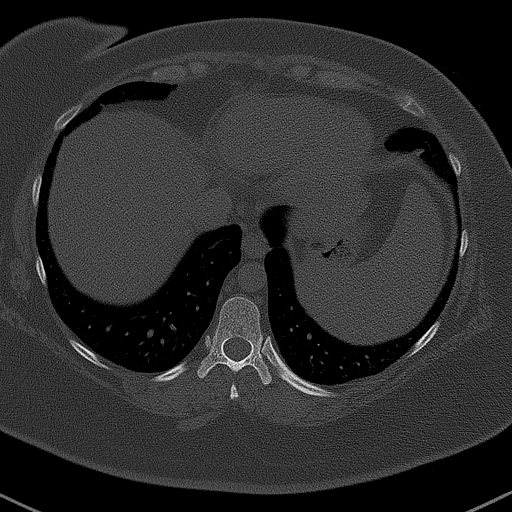
[im 45/63  soft-tissue]
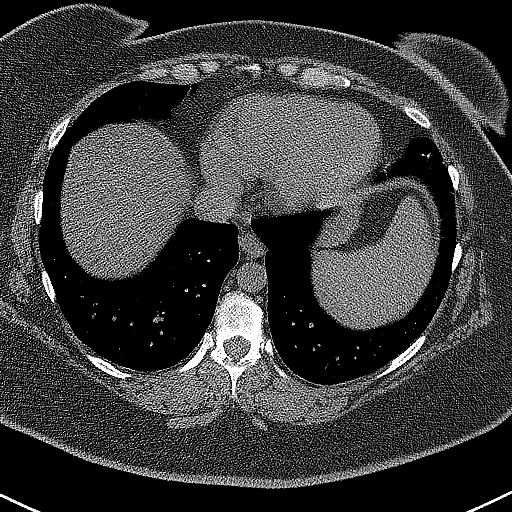
[im 51/63  soft-tissue]
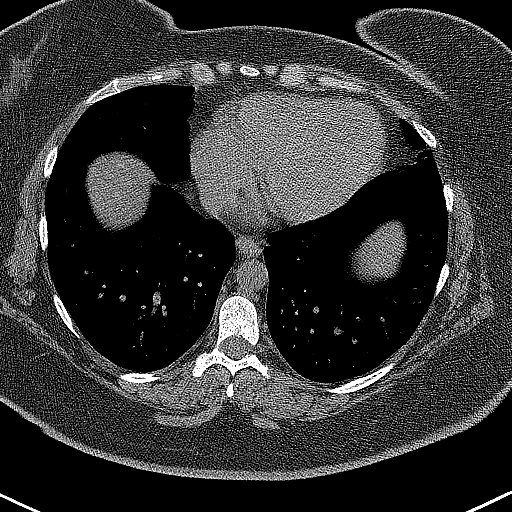
[im 55/63  soft-tissue]
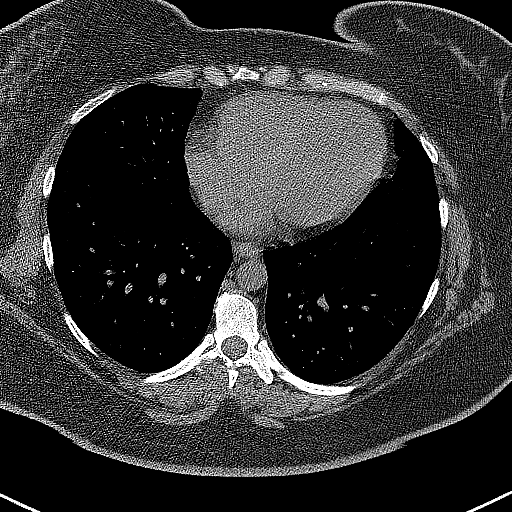
[im 59/63  soft-tissue]
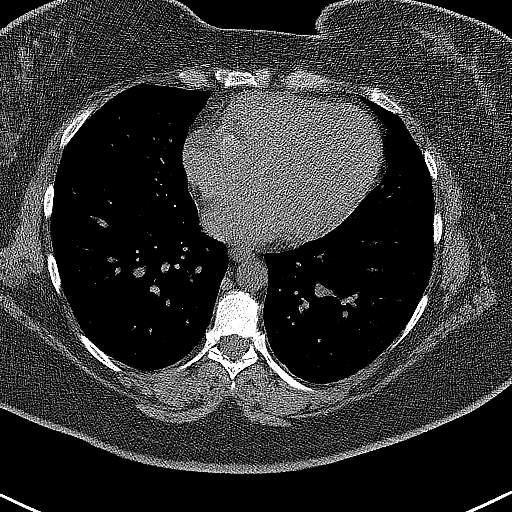

[Series 5: coronal · coronal · 0.84mm/px · 3 of 169 slices shown]
[im 57/169  soft-tissue]
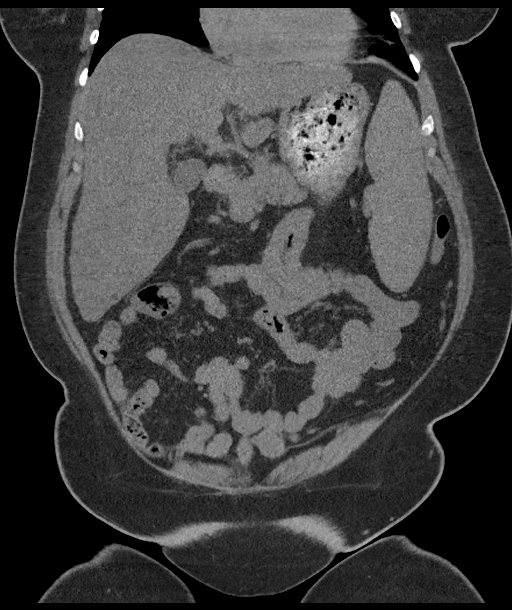
[im 75/169  soft-tissue]
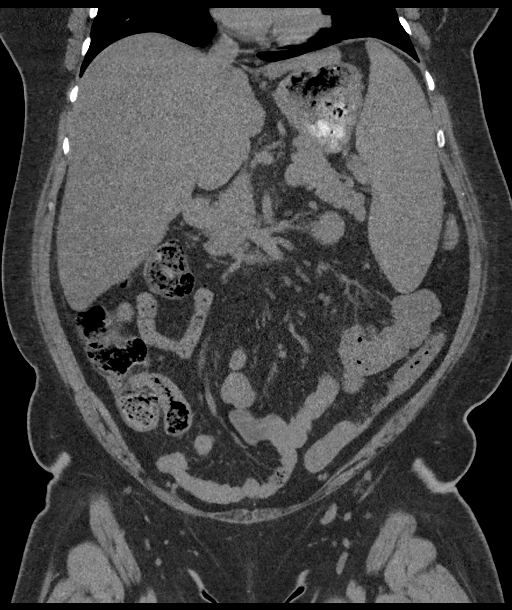
[im 94/169  soft-tissue]
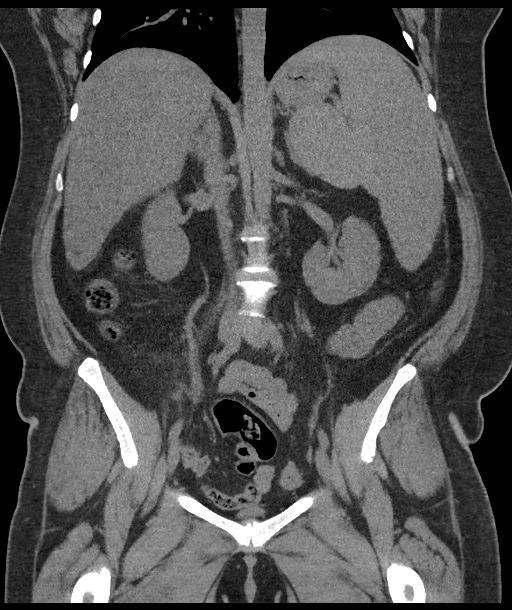

[16 of 46 positions shown; findings below may reference images not displayed]

FINDINGS: Lower chest: No acute abnormality.

Hepatobiliary: There is diffuse fatty infiltration of the liver
parenchyma. A thin layer of tiny gallstones is seen within the lumen
of an otherwise normal-appearing gallbladder.

Pancreas: Unremarkable. No pancreatic ductal dilatation or
surrounding inflammatory changes.

Spleen: Moderate to marked severity splenomegaly is noted.

Adrenals/Urinary Tract: Adrenal glands are unremarkable. Kidneys are
normal in size, without obstructing renal calculi or hydronephrosis.
A 1 mm parenchymal calcification is seen within the anterior aspect
of the mid left kidney. Bladder is unremarkable.

Stomach/Bowel: Stomach is within normal limits. Appendix appears
normal. No evidence of bowel wall thickening, distention, or
inflammatory changes.

Vascular/Lymphatic: No significant vascular findings are present. No
enlarged abdominal or pelvic lymph nodes.

Reproductive: The uterus is surgically absent. A 3.4 cm x 2.5 cm
area of heterogeneous low attenuation is seen within the posterior
aspect of the right adnexa (axial CT image 76, CT series 2). A
cm x 3.3 cm area of soft tissue attenuation (approximately
Hounsfield units) is seen within the posterior aspect of the left
adnexa.

Other: No abdominal wall hernia or abnormality. No abdominopelvic
ascites.

Musculoskeletal: No acute or significant osseous findings.
IMPRESSION: 1. Cholelithiasis.
2. Splenomegaly.
3. Hepatic steatosis.
4. Areas of heterogeneous soft tissue and low attenuation within the
posterior aspects of the bilateral adnexa which are likely ovarian
in origin. Correlation with pelvic ultrasound is recommended to
exclude the presence of an underlying adnexal soft tissue mass.

## 2022-08-19 ENCOUNTER — Encounter: Payer: Self-pay | Admitting: Nurse Practitioner

## 2022-08-19 ENCOUNTER — Ambulatory Visit: Payer: BC Managed Care – PPO | Admitting: Nurse Practitioner

## 2022-08-19 VITALS — BP 104/65 | HR 71 | Temp 98.1°F | Ht 66.0 in | Wt 226.0 lb

## 2022-08-19 DIAGNOSIS — Z6836 Body mass index (BMI) 36.0-36.9, adult: Secondary | ICD-10-CM | POA: Diagnosis not present

## 2022-08-19 DIAGNOSIS — Z794 Long term (current) use of insulin: Secondary | ICD-10-CM

## 2022-08-19 DIAGNOSIS — E669 Obesity, unspecified: Secondary | ICD-10-CM

## 2022-08-19 DIAGNOSIS — E1169 Type 2 diabetes mellitus with other specified complication: Secondary | ICD-10-CM | POA: Diagnosis not present

## 2022-08-19 MED ORDER — OZEMPIC (0.25 OR 0.5 MG/DOSE) 2 MG/3ML ~~LOC~~ SOPN
0.2500 mg | PEN_INJECTOR | SUBCUTANEOUS | 0 refills | Status: DC
Start: 2022-08-19 — End: 2022-11-08

## 2022-08-19 NOTE — Progress Notes (Signed)
Office: [phone]  /  Fax: [phone]  WEIGHT SUMMARY AND BIOMETRICS  No data recorded Weight Gained Since Last Visit: 5lb   Vitals Temp: 98.1 F (36.7 C) BP: 104/65 Pulse Rate: 71 SpO2: 96 %   Anthropometric Measurements Height: 5\' 6"  (1.676 m) Weight: 226 lb (102.5 kg) BMI (Calculated): 36.49 Weight at Last Visit: 221lb Weight Gained Since Last Visit: 5lb Starting Weight: 230lb Total Weight Loss (lbs): 4 lb (1.814 kg)   Body Composition  Body Fat %: 46.8 % Fat Mass (lbs): 105.8 lbs Muscle Mass (lbs): 114.4 lbs Total Body Water (lbs): 84.4 lbs Visceral Fat Rating : 11   Other Clinical Data Fasting: No Labs: No Today's Visit #: 18 Starting Date: 06/09/21     HPI  Chief Complaint: OBESITY  Laurie Sutton is here to discuss her progress with her obesity treatment plan. She is on the keeping a food journal and adhering to recommended goals of 1800 calories and 100+ protein and states she is following her eating plan approximately 25-30 % of the time. She states she is exercising 0 minutes 0 days per week.   Interval History:  Since last office visit she has gained 5 pounds.     Pharmacotherapy for weight loss: She is not currently taking medications  for medical weight loss.    Previous pharmacotherapy for medical weight loss:  She thinks she tried Phentermine in the past.   Bariatric surgery:  Patient has not had bariatric surgery.  Was interested but was not consider a candidate.  If she decides she would like to proceed with surgery, will send to [facility] for further evaluation.   Pharmacotherapy for DMT2:  She is currently taking Humulin breakfast dose is 100, lunch 60-80 units and dinner is 60-80 units.  Denies side effects.  She was taking Mounjaro and stopped 2-3 weeks ago due to shortage-unable to find.  She is struggling with polyphagia and cravings.   Last A1c was 8.8 CBGs: Fasting 200+, average sugars for the past 7 days was 338 and 2 weeks  300 Episodes of hypoglycemia: no She is not on an ACE or ARB, ASA 81mg  and statin.  Last eye exam:  2023-scheduled June 21st She has tried Metformin-stopped due to side effects of body aches, glipizide, januvia-stopped due to abd pain/cramping, Ozempic-nausea, Trulicity-nausea, in the past.   She is scheduled to see endo June 18th  Lab Results  Component Value Date   HGBA1C 9.1 (A) 04/08/2022   HGBA1C 9.6 04/27/2019   HGBA1C 6.2 08/19/2017   Lab Results  Component Value Date   MICROALBUR 0.93 04/27/2019   LDLCALC 91 08/19/2017   CREATININE 0.46 03/23/2022     PHYSICAL EXAM:  Blood pressure 104/65, pulse 71, temperature 98.1 F (36.7 C), height 5\' 6"  (1.676 m), weight 226 lb (102.5 kg), last menstrual period 11/13/2020, SpO2 96 %. Body mass index is 36.48 kg/m.  General: She is overweight, cooperative, alert, well developed, and in no acute distress. PSYCH: Has normal mood, affect and thought process.   Extremities: No edema.  Neurologic: No gross sensory or motor deficits. No tremors or fasciculations noted.    DIAGNOSTIC DATA REVIEWED:  BMET    Component Value Date/Time   NA 135 03/23/2022 0919   NA 138 08/19/2017 0000   K 3.4 (L) 03/23/2022 0919   CL 101 03/23/2022 0919   CO2 25 03/23/2022 0919   GLUCOSE 191 (H) 03/23/2022 0919   BUN 7 03/23/2022 0919   BUN 8 08/19/2017 0000  CREATININE 0.46 03/23/2022 0919   CREATININE 0.42 (L) 10/29/2021 0848   CALCIUM 8.9 03/23/2022 0919   GFRNONAA >60 06/08/2021 0910   GFRAA >60 08/09/2019 0459   Lab Results  Component Value Date   HGBA1C 9.1 (A) 04/08/2022   HGBA1C 12.0 (H) 11/12/2013   No results found for: "INSULIN" Lab Results  Component Value Date   TSH 1.170 06/09/2021   CBC    Component Value Date/Time   WBC 4.0 03/23/2022 0919   RBC 4.69 03/23/2022 0919   HGB 13.3 03/23/2022 0919   HCT 39.5 03/23/2022 0919   PLT 91.0 (L) 03/23/2022 0919   MCV 84.3 03/23/2022 0919   MCH 28.6 10/29/2021 0848   MCHC  33.6 03/23/2022 0919   RDW 16.2 (H) 03/23/2022 0919   Iron Studies No results found for: "IRON", "TIBC", "FERRITIN", "IRONPCTSAT" Lipid Panel     Component Value Date/Time   CHOL 169 08/19/2017 0000   TRIG 202 (A) 08/19/2017 0000   HDL 38 08/19/2017 0000   CHOLHDL 4 05/12/2015 1042   VLDL 37.8 05/12/2015 1042   LDLCALC 91 08/19/2017 0000   Hepatic Function Panel     Component Value Date/Time   PROT 7.1 03/23/2022 0919   ALBUMIN 3.8 03/23/2022 0919   AST 41 (H) 03/23/2022 0919   ALT 32 03/23/2022 0919   ALKPHOS 100 03/23/2022 0919   BILITOT 1.3 (H) 03/23/2022 0919   BILIDIR 0.2 04/27/2019 1223      Component Value Date/Time   TSH 1.170 06/09/2021 0915   Nutritional Lab Results  Component Value Date   VD25OH 34 10/29/2021   VD25OH 12.8 (L) 06/09/2021     ASSESSMENT AND PLAN  TREATMENT PLAN FOR OBESITY:  Recommended Dietary Goals  Laurie Sutton is currently in the action stage of change. As such, her goal is to continue weight management plan. She has agreed to the Category 4 Plan.  Behavioral Intervention  We discussed the following Behavioral Modification Strategies today: increasing lean protein intake, decreasing simple carbohydrates , increasing vegetables, increasing lower glycemic fruits, increasing fiber rich foods, avoiding skipping meals, increasing water intake, continue to practice mindfulness when eating, and planning for success.  Additional resources provided today: NA  Recommended Physical Activity Goals  Laurie Sutton has been advised to work up to 150 minutes of moderate intensity aerobic activity a week and strengthening exercises 2-3 times per week for cardiovascular health, weight loss maintenance and preservation of muscle mass.   She has agreed to Think about ways to increase daily physical activity and overcoming barriers to exercise, Increase physical activity in their day and reduce sedentary time (increase NEAT)., and Work on scheduling and tracking  physical activity.    ASSOCIATED CONDITIONS ADDRESSED TODAY  Action/Plan  Type 2 diabetes mellitus with other specified complication, with long-term current use of insulin (HCC) -    Start Ozempic (0.25 or 0.5 MG/DOSE); Inject 0.25 mg into the skin once a week.  Dispense: 3 mL; Refill: 0. Side effects discussed  Will closely monitor BS at home, to keep a log and I will review at next visit.   Good blood sugar control is important to decrease the likelihood of diabetic complications such as nephropathy, neuropathy, limb loss, blindness, coronary artery disease, and death. Intensive lifestyle modification including diet, exercise and weight loss are the first line of treatment for diabetes.    Generalized obesity: Starting BMI 36  BMI 36.0-36.9,adult         Return in about 4 weeks (around 09/16/2022).Marland Kitchen She  was informed of the importance of frequent follow up visits to maximize her success with intensive lifestyle modifications for her multiple health conditions.   ATTESTASTION STATEMENTS:  Reviewed by clinician on day of visit: allergies, medications, problem list, medical history, surgical history, family history, social history, and previous encounter notes.     Theodis Sato. Roland Prine FNP-C

## 2022-08-19 NOTE — Patient Instructions (Signed)
What is a GLP-1 Glucagon like peptide-1 (GLP-1) agonists represent a class of medications used to treat type 2 diabetes mellitus and obesity.  GLP-1 medications mimic the action of a hormone called glucagon like peptide 1.  When blood sugar levels start to rise/increase these drugs stimulate the body to produce more insulin.  When that happens, the extra insulin helps to lower the blood sugar levels in the body.  This in returns helps with decreasing cravings.  These medications also slow the movement of food from the stomach into the small intestine.  This in return helps one to full faster and longer.    Diabetic medications: Approved for treatment of diabetes mellitus but does not have full approval for weight loss use Victoza (liraglutide) Ozempic (semaglutide) Mounjaro Trulicity Rybelsus  Weight loss medications: Approved for long-term weight loss use.        Saxenda (liraglutide) Wegovy (semaglutide) Zepbound  Contraindications:  Pancreatitis (active gallstones) Medullary thyroid cancer High triglycerides (>500)-will need labs prior to starting Multiple Endocrine Neoplasia syndrome type 2 (MEN 2) Trying to get pregnant Breastfeeding Use with caution with taking insulin or sulfonylureas (will need to monitor blood sugars for hypoglycemia) Side effects (most common): Most common side effects are nausea, gas, bloating and constipation.  Other possible side effects are headaches, belching, diarrhea, tiredness (fatigue), vomiting, upset stomach, dizziness, heartburn and stomach (abdominal pain).  If you think that you are becoming dehydrated, please inform our office or your primary family provider.  Stop immediately and go to ER if you have any symptoms of a serious allergic reaction including swelling of your face, lips, tongue or throat; problems breathing or swallowing; severe rash or itching; fainting or feeling dizzy; or very rapid heart rate.                                                                                           

## 2022-08-20 ENCOUNTER — Other Ambulatory Visit: Payer: Self-pay | Admitting: Adult Health

## 2022-08-20 DIAGNOSIS — J988 Other specified respiratory disorders: Secondary | ICD-10-CM

## 2022-08-25 ENCOUNTER — Ambulatory Visit (INDEPENDENT_AMBULATORY_CARE_PROVIDER_SITE_OTHER): Payer: BC Managed Care – PPO | Admitting: Family Medicine

## 2022-08-25 ENCOUNTER — Encounter: Payer: Self-pay | Admitting: Family Medicine

## 2022-08-25 VITALS — BP 110/74 | HR 74 | Temp 98.0°F | Wt 224.0 lb

## 2022-08-25 DIAGNOSIS — L732 Hidradenitis suppurativa: Secondary | ICD-10-CM | POA: Diagnosis not present

## 2022-08-25 DIAGNOSIS — J0191 Acute recurrent sinusitis, unspecified: Secondary | ICD-10-CM | POA: Diagnosis not present

## 2022-08-25 DIAGNOSIS — J309 Allergic rhinitis, unspecified: Secondary | ICD-10-CM

## 2022-08-25 MED ORDER — MONTELUKAST SODIUM 10 MG PO TABS: 10.0000 mg | ORAL_TABLET | Freq: Every day | ORAL | 3 refills | Status: DC

## 2022-08-25 MED ORDER — LEVOFLOXACIN 500 MG PO TABS: 500.0000 mg | ORAL_TABLET | Freq: Every day | ORAL | 0 refills | Status: AC

## 2022-08-25 NOTE — Addendum Note (Signed)
Addended by: Gershon Crane A on: 08/25/2022 05:18 PM   Modules accepted: Orders

## 2022-08-25 NOTE — Progress Notes (Signed)
   Subjective:    Patient ID: Laurie Sutton, female    DOB: 1980-12-22, 42 y.o.   MRN: [id number]  HPI Here for 2 issues. First she has had 10 days of pressure on the sinuses, blowing out green mucus, and PND. No fever or ST or cough. Also she continues to deal with painful boils in both armpits. These started bothering her about 8 years ago. They flare up at times and we have treated them with Doxycycline, but they never go away.    Review of Systems  Constitutional: Negative.   HENT:  Positive for congestion, postnasal drip and sinus pressure. Negative for ear pain and sore throat.   Eyes: Negative.   Respiratory: Negative.         Objective:   Physical Exam Constitutional:      Appearance: Normal appearance. She is not ill-appearing.  HENT:     Right Ear: Tympanic membrane, ear canal and external ear normal.     Left Ear: Tympanic membrane, ear canal and external ear normal.     Nose: Nose normal.     Mouth/Throat:     Pharynx: Oropharynx is clear.  Eyes:     Conjunctiva/sclera: Conjunctivae normal.  Pulmonary:     Effort: Pulmonary effort is normal.     Breath sounds: Normal breath sounds.  Lymphadenopathy:     Cervical: No cervical adenopathy.  Skin:    Comments: Both axilla have multiple boils under the skin that are tender, the right worse than the left   Neurological:     Mental Status: She is alert.           Assessment & Plan:  She has a sinusitis, and we will treat this with 10 days of Levaquin. For the hydradenitis, we will refer her to Plastic Surgery to consider a surgical solution.  Gershon Crane, MD

## 2022-09-20 ENCOUNTER — Encounter: Payer: Self-pay | Admitting: Family Medicine

## 2022-09-20 ENCOUNTER — Ambulatory Visit: Payer: BC Managed Care – PPO | Admitting: Family Medicine

## 2022-09-20 VITALS — BP 100/60 | HR 74 | Temp 98.5°F | Wt 222.0 lb

## 2022-09-20 DIAGNOSIS — N390 Urinary tract infection, site not specified: Secondary | ICD-10-CM | POA: Diagnosis not present

## 2022-09-20 LAB — POC URINALSYSI DIPSTICK (AUTOMATED)
Bilirubin, UA: NEGATIVE
Blood, UA: NEGATIVE
Glucose, UA: POSITIVE — AB
Ketones, UA: NEGATIVE
Leukocytes, UA: NEGATIVE
Nitrite, UA: NEGATIVE
Protein, UA: NEGATIVE
Spec Grav, UA: 1.01 (ref 1.010–1.025)
Urobilinogen, UA: 0.2 E.U./dL
pH, UA: 6 (ref 5.0–8.0)

## 2022-09-20 MED ORDER — CIPROFLOXACIN HCL 500 MG PO TABS
500.0000 mg | ORAL_TABLET | Freq: Two times a day (BID) | ORAL | 0 refills | Status: DC
Start: 2022-09-20 — End: 2022-10-07

## 2022-09-20 NOTE — Addendum Note (Signed)
Addended by: Carola Rhine on: 09/20/2022 02:22 PM   Modules accepted: Orders

## 2022-09-20 NOTE — Progress Notes (Signed)
   Subjective:    Patient ID: Laurie Sutton, female    DOB: Jan 16, 1981, 42 y.o.   MRN: [id number]  HPI Here for 5 days of urinary pressure and burning. No fever or nausea. She is drinking plenty of water. On her UA today she showed 3+ for glucose. She says her glucoses at home have stayed in the 300's for months.  She will see her endocrinologist, Dr. Ocie Cornfield, on 09-28-22.    Review of Systems  Constitutional: Negative.   Respiratory: Negative.    Cardiovascular: Negative.   Gastrointestinal: Negative.   Genitourinary:  Positive for dysuria, frequency and urgency. Negative for flank pain and hematuria.       Objective:   Physical Exam Constitutional:      Appearance: Normal appearance.  Cardiovascular:     Rate and Rhythm: Normal rate and regular rhythm.     Pulses: Normal pulses.     Heart sounds: Normal heart sounds.  Pulmonary:     Effort: Pulmonary effort is normal.     Breath sounds: Normal breath sounds.  Abdominal:     Tenderness: There is no right CVA tenderness or left CVA tenderness.  Neurological:     Mental Status: She is alert.           Assessment & Plan:  UTI, treat with 7 days of Cipro. Culture the sample. She will follow up with Dr. Roanna Raider as above. Gershon Crane, MD

## 2022-09-21 LAB — URINE CULTURE
MICRO NUMBER:: 15062211
Result:: NO GROWTH
SPECIMEN QUALITY:: ADEQUATE

## 2022-09-22 ENCOUNTER — Encounter: Payer: Self-pay | Admitting: Nurse Practitioner

## 2022-09-23 ENCOUNTER — Ambulatory Visit: Payer: Self-pay | Admitting: Surgery

## 2022-09-23 ENCOUNTER — Ambulatory Visit: Payer: BC Managed Care – PPO | Admitting: Nurse Practitioner

## 2022-09-24 ENCOUNTER — Other Ambulatory Visit: Payer: Self-pay | Admitting: Neurology

## 2022-09-27 ENCOUNTER — Encounter: Payer: Self-pay | Admitting: Nurse Practitioner

## 2022-09-27 NOTE — Telephone Encounter (Signed)
Refill provided. Patient must schedule follow up for future refills.

## 2022-09-28 ENCOUNTER — Other Ambulatory Visit: Payer: Self-pay

## 2022-09-28 LAB — HEMOGLOBIN A1C: Hemoglobin A1C: 11.1

## 2022-09-28 MED ORDER — MOUNJARO 5 MG/0.5ML ~~LOC~~ SOAJ
5.0000 mg | SUBCUTANEOUS | 5 refills | Status: DC
  Filled 2022-09-28: qty 2, 28d supply, fill #0
  Filled 2022-10-29: qty 2, 28d supply, fill #1
  Filled 2022-11-25: qty 2, 28d supply, fill #2

## 2022-09-29 IMAGING — CT CT ABD-PELV W/ CM
2 of 5 series · 15 of 46 positions shown, 17 images · IV contrast (APPLIED)
Comparison: CT abdomen and pelvis 06/05/2021, pelvic ultrasound
06/08/2021

CLINICAL DATA: Lower abdominal pain

EXAM:
CT ABDOMEN AND PELVIS WITH CONTRAST
TECHNIQUE: Multidetector CT imaging of the abdomen and pelvis was performed
using the standard protocol following bolus administration of
intravenous contrast.

[Series 2: axial st · axial · 0.98mm/px · z∈[-520,-95]mm · 12 of 101 slices shown, 14 images]
[im 8/101  soft-tissue]
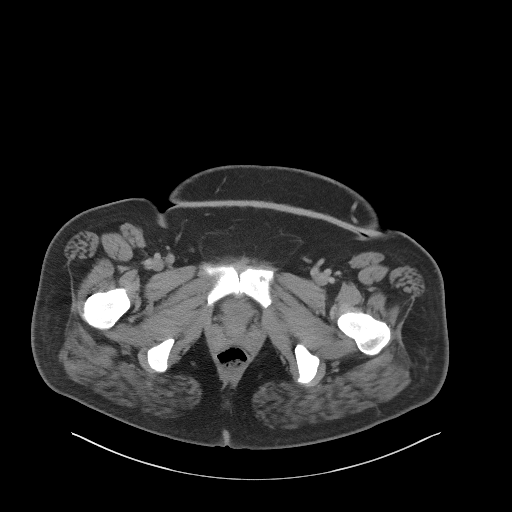
[im 8/101  bone]
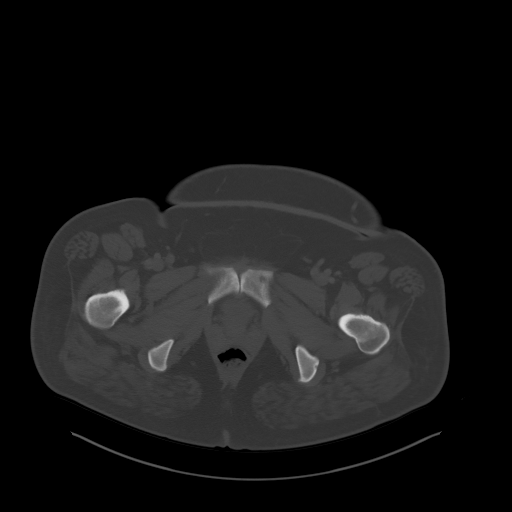
[im 16/101  soft-tissue]
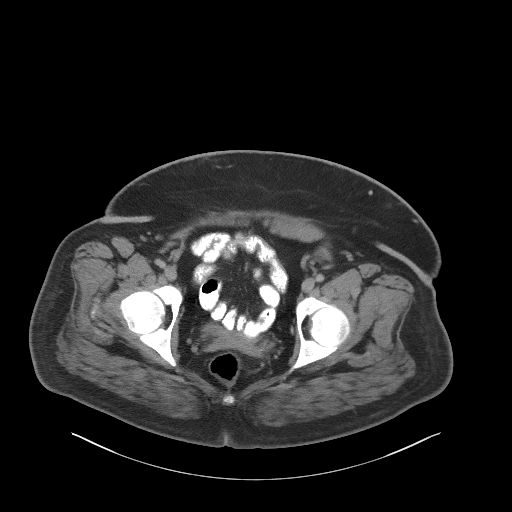
[im 24/101  soft-tissue]
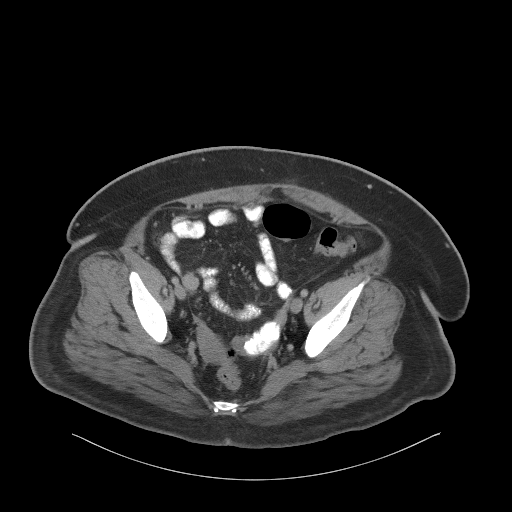
[im 31/101  soft-tissue]
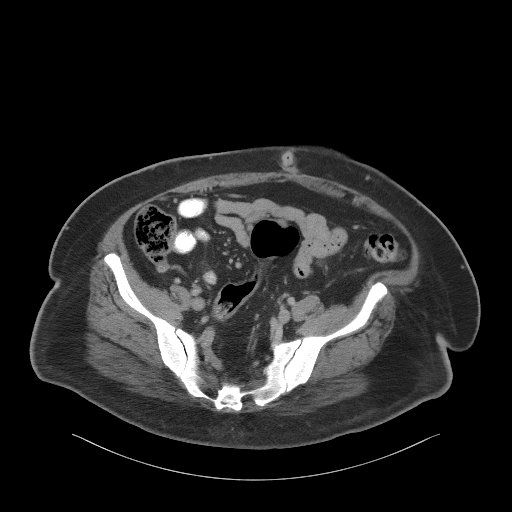
[im 39/101  soft-tissue]
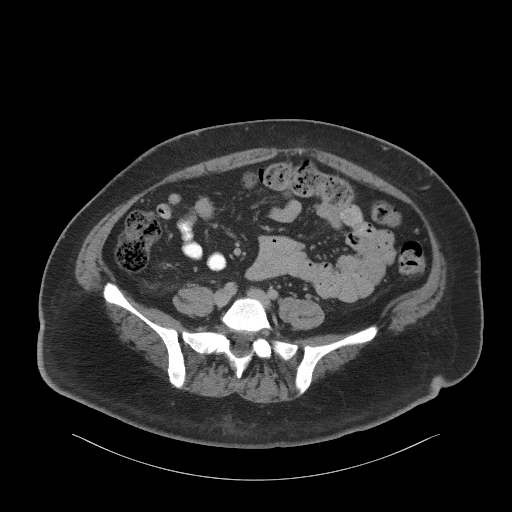
[im 47/101  soft-tissue]
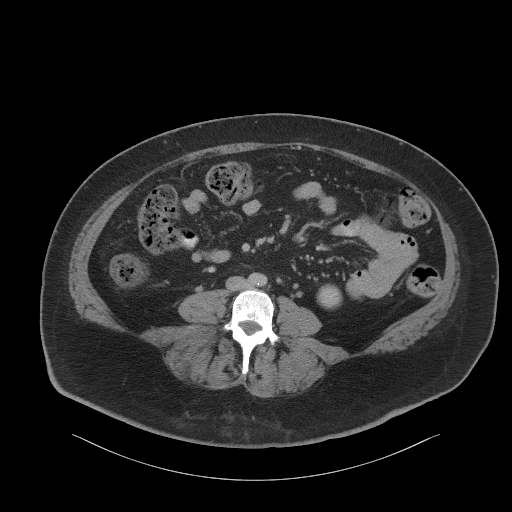
[im 54/101  soft-tissue]
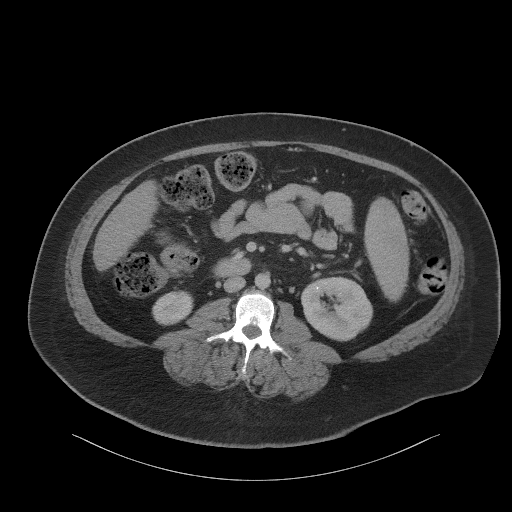
[im 62/101  soft-tissue]
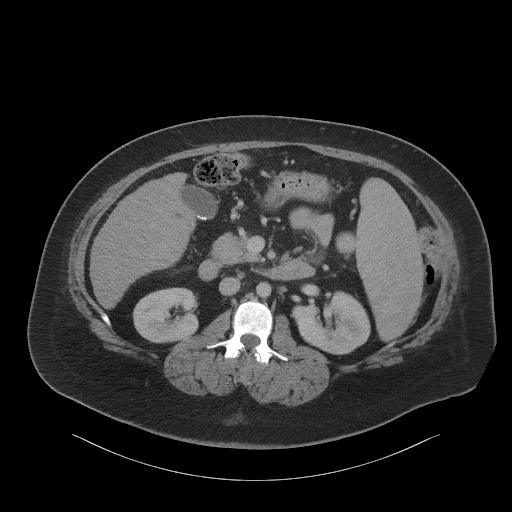
[im 70/101  soft-tissue]
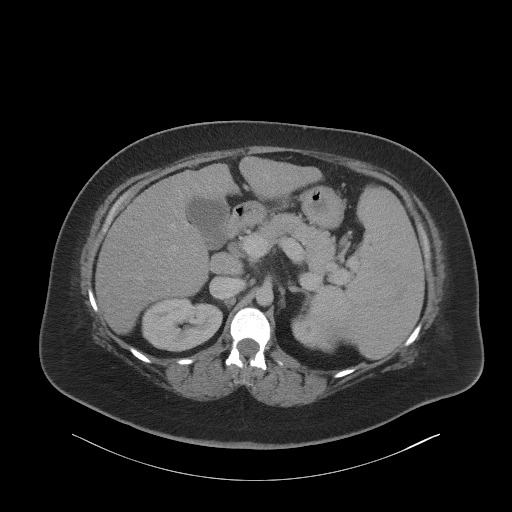
[im 70/101  bone]
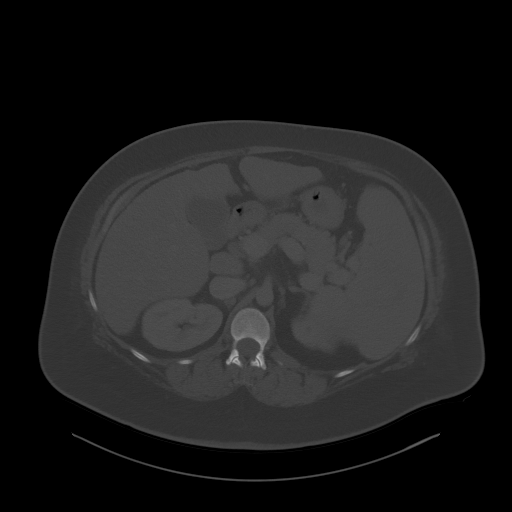
[im 77/101  soft-tissue]
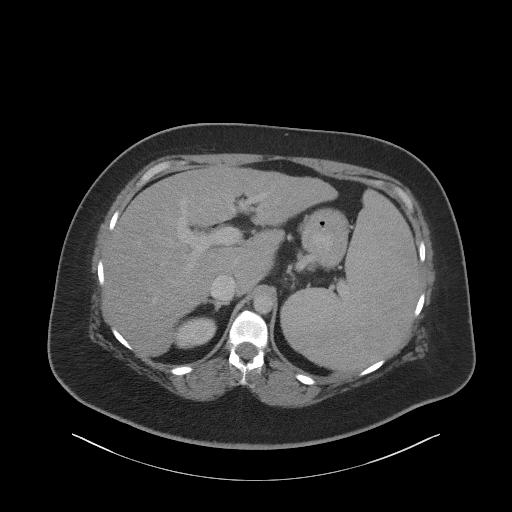
[im 85/101  soft-tissue]
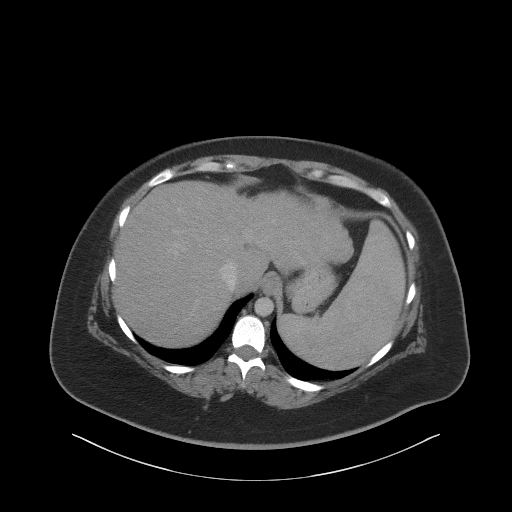
[im 93/101  soft-tissue]
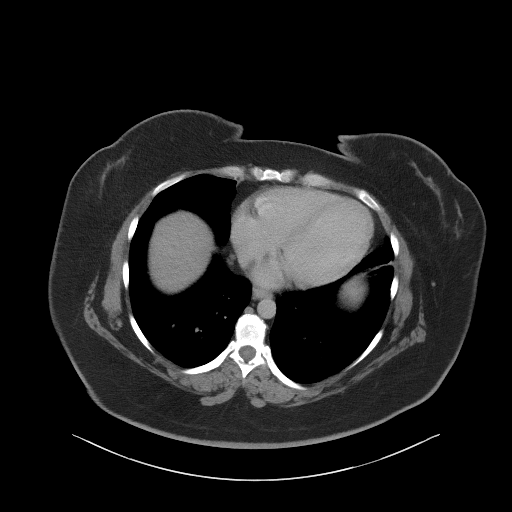

[Series 4: coronal st · coronal · 0.88mm/px · 3 of 108 slices shown]
[im 36/108  soft-tissue]
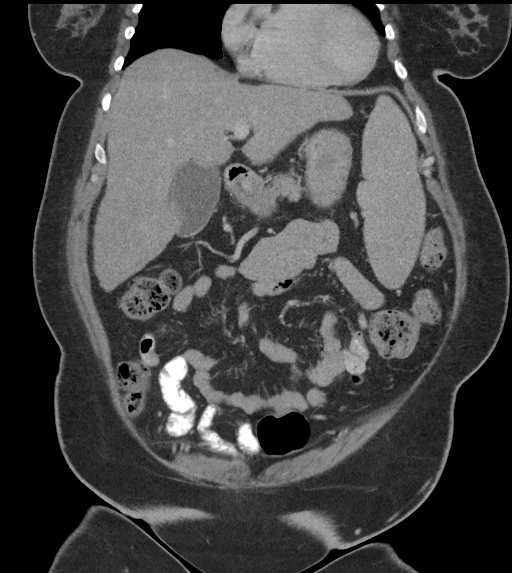
[im 48/108  soft-tissue]
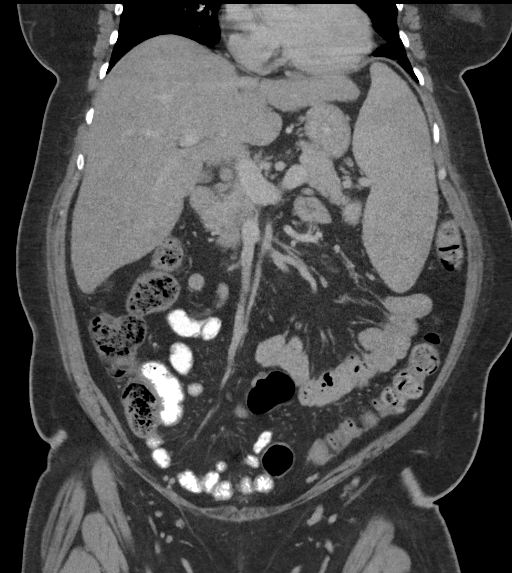
[im 60/108  soft-tissue]
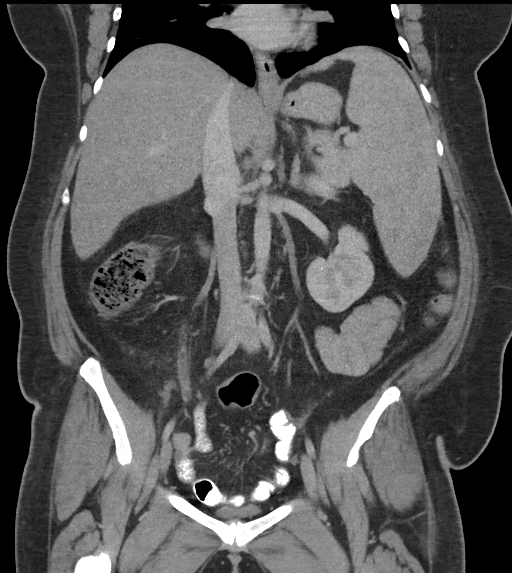

[15 of 46 positions shown; findings below may reference images not displayed]

RADIATION DOSE REDUCTION: This exam was performed according to the
departmental dose-optimization program which includes automated
exposure control, adjustment of the mA and/or kV according to
patient size and/or use of iterative reconstruction technique.

CONTRAST:  100mL OMNIPAQUE IOHEXOL 300 MG/ML  SOLN
FINDINGS: Lower chest: Subsegmental atelectatic changes in the lung bases.

Hepatobiliary: Liver is markedly enlarged measuring 23.9 cm in
length, with nodular contour consistent with cirrhosis. 7 mm
hypodensity in the right hepatic lobe near the gallbladder which
appears stable in size. Small gallstones. No gallbladder wall
thickening or surrounding inflammatory changes. No biliary ductal
dilatation identified.

Pancreas: Unremarkable. No pancreatic ductal dilatation or
surrounding inflammatory changes.

Spleen: Markedly enlarged measuring 20.7 cm in length. 2.6 cm likely
splenule at the hilum of the spleen.

Adrenals/Urinary Tract: Adrenal glands appear normal. 9 mm hypodense
likely cyst in the upper pole left kidney. Punctate cortical
calcification in the lower left kidney unchanged. No hydronephrosis
or perinephric edema. Urinary bladder is incompletely distended with
no obvious mass visualized.

Stomach/Bowel: No bowel obstruction, free air or pneumatosis. No
bowel wall edema. Moderate amount of retained fecal material
throughout the colon. Appendix is normal.

Vascular/Lymphatic: No significant vascular findings are present. No
enlarged abdominal or pelvic lymph nodes.

Reproductive: Status post hysterectomy. There are bilateral adnexal
masses/prominent ovaries measuring 4.2 x 2.8 cm on the right and
x 2.8 cm on the left, similar to previous study.

Other: No ascites.

Musculoskeletal: No acute or significant osseous findings.
IMPRESSION: 1. Marked hepatomegaly and evidence hepatic cirrhosis. Stable 7 mm
hypodensity in the right hepatic lobe since 0300, most likely a
cyst.
2. Marked splenomegaly suggesting portal hypertension.
3. Cholelithiasis.
4. Bilateral prominent ovaries/adnexal masses which appear grossly
unchanged since previous CT. Correlate with previous pelvic
ultrasound results and consider follow-up ultrasound as indicated.

## 2022-10-06 ENCOUNTER — Ambulatory Visit: Payer: BC Managed Care – PPO | Admitting: Nurse Practitioner

## 2022-10-06 ENCOUNTER — Encounter: Payer: Self-pay | Admitting: Nurse Practitioner

## 2022-10-06 VITALS — BP 113/74 | HR 88 | Temp 98.2°F | Ht 66.0 in | Wt 214.0 lb

## 2022-10-06 DIAGNOSIS — Z7985 Long-term (current) use of injectable non-insulin antidiabetic drugs: Secondary | ICD-10-CM

## 2022-10-06 DIAGNOSIS — Z794 Long term (current) use of insulin: Secondary | ICD-10-CM

## 2022-10-06 DIAGNOSIS — E669 Obesity, unspecified: Secondary | ICD-10-CM

## 2022-10-06 DIAGNOSIS — E559 Vitamin D deficiency, unspecified: Secondary | ICD-10-CM | POA: Diagnosis not present

## 2022-10-06 DIAGNOSIS — Z6834 Body mass index (BMI) 34.0-34.9, adult: Secondary | ICD-10-CM | POA: Diagnosis not present

## 2022-10-06 DIAGNOSIS — E1169 Type 2 diabetes mellitus with other specified complication: Secondary | ICD-10-CM | POA: Diagnosis not present

## 2022-10-06 MED ORDER — VITAMIN D (ERGOCALCIFEROL) 1.25 MG (50000 UNIT) PO CAPS
50000.0000 [IU] | ORAL_CAPSULE | ORAL | 0 refills | Status: DC
Start: 2022-10-06 — End: 2022-12-02

## 2022-10-06 NOTE — Progress Notes (Signed)
Office: [phone]  /  Fax: [phone]  WEIGHT SUMMARY AND BIOMETRICS  Weight Lost Since Last Visit: 12lb  No data recorded  Vitals Temp: 98.2 F (36.8 C) BP: 113/74 Pulse Rate: 88 SpO2: 95 %   Anthropometric Measurements Height: 5\' 6"  (1.676 m) Weight: 214 lb (97.1 kg) BMI (Calculated): 34.56 Weight at Last Visit: 226lb Weight Lost Since Last Visit: 12lb Starting Weight: 230lb Total Weight Loss (lbs): 16 lb (7.258 kg)   Body Composition  Body Fat %: 42.5 % Fat Mass (lbs): 91 lbs Muscle Mass (lbs): 117 lbs Total Body Water (lbs): 77.2 lbs Visceral Fat Rating : 10   Other Clinical Data Fasting: No Labs: No Today's Visit #: 19 Starting Date: 06/09/21     HPI  Chief Complaint: OBESITY  Laurie Sutton is here to discuss her progress with her obesity treatment plan. She is on the keeping a food journal and adhering to recommended goals of 1800 calories and 100+ protein and states she is following her eating plan approximately 0 % of the time. She states she is exercising 0 minutes 0 days per week.   Interval History:  Since last office visit she has lost 12 pounds.  Her father had a stoke yesterday and is currently in the hospital.     Pharmacotherapy for weight loss: She is not currently taking medications  for medical weight loss.    Previous pharmacotherapy for medical weight loss:  Phentermine  Bariatric surgery:  Patient has not had bariatric surgery. Was interested but was not consider a candidate. If she decides she would like to proceed with surgery, will send to [facility] for further evaluation.   Pharmacotherapy for DMT2:  She is currently taking Humulin breakfast dose is 60 units, lunch 70 units and dinner is 70 units.  Reports side effects of nausea since starting Mounjaro.  She was taking Ozempic and was recently restarted on Mounjaro 5mg  by endo after her last visit on June 18th.   She has stopped taking Ozempic since starting Mounjaro.   Last A1c was  11.1 on June 18th CBGs: Fasting 170s.   Episodes of hypoglycemia: no She is not on an ACE or ARB, ASA 81mg  and statin.  Last eye exam:  2023-scheduled June 21st She has tried Metformin-stopped due to side effects of body aches, glipizide, januvia-stopped due to abd pain/cramping, Ozempic-nausea, Trulicity-nausea, in the past.   She is scheduled to see endo back for follow up on August 2nd  Vit D deficiency  She is taking Vit D 50,000 IU weekly.  Denies side effects.  Denies nausea, vomiting or muscle weakness.    Lab Results  Component Value Date   VD25OH 34 10/29/2021   VD25OH 12.8 (L) 06/09/2021     PHYSICAL EXAM:  Blood pressure 113/74, pulse 88, temperature 98.2 F (36.8 C), height 5\' 6"  (1.676 m), weight 214 lb (97.1 kg), last menstrual period 11/13/2020, SpO2 95 %. Body mass index is 34.54 kg/m.  General: She is overweight, cooperative, alert, well developed, and in no acute distress. PSYCH: Has normal mood, affect and thought process.   Extremities: No edema.  Neurologic: No gross sensory or motor deficits. No tremors or fasciculations noted.    DIAGNOSTIC DATA REVIEWED:  BMET    Component Value Date/Time   NA 135 03/23/2022 0919   NA 138 08/19/2017 0000   K 3.4 (L) 03/23/2022 0919   CL 101 03/23/2022 0919   CO2 25 03/23/2022 0919   GLUCOSE 191 (H) 03/23/2022 0919  BUN 7 03/23/2022 0919   BUN 8 08/19/2017 0000   CREATININE 0.46 03/23/2022 0919   CREATININE 0.42 (L) 10/29/2021 0848   CALCIUM 8.9 03/23/2022 0919   GFRNONAA >60 06/08/2021 0910   GFRAA >60 08/09/2019 0459   Lab Results  Component Value Date   HGBA1C 9.1 (A) 04/08/2022   HGBA1C 12.0 (H) 11/12/2013   No results found for: "INSULIN" Lab Results  Component Value Date   TSH 1.170 06/09/2021   CBC    Component Value Date/Time   WBC 4.0 03/23/2022 0919   RBC 4.69 03/23/2022 0919   HGB 13.3 03/23/2022 0919   HCT 39.5 03/23/2022 0919   PLT 91.0 (L) 03/23/2022 0919   MCV 84.3 03/23/2022  0919   MCH 28.6 10/29/2021 0848   MCHC 33.6 03/23/2022 0919   RDW 16.2 (H) 03/23/2022 0919   Iron Studies No results found for: "IRON", "TIBC", "FERRITIN", "IRONPCTSAT" Lipid Panel     Component Value Date/Time   CHOL 169 08/19/2017 0000   TRIG 202 (A) 08/19/2017 0000   HDL 38 08/19/2017 0000   CHOLHDL 4 05/12/2015 1042   VLDL 37.8 05/12/2015 1042   LDLCALC 91 08/19/2017 0000   Hepatic Function Panel     Component Value Date/Time   PROT 7.1 03/23/2022 0919   ALBUMIN 3.8 03/23/2022 0919   AST 41 (H) 03/23/2022 0919   ALT 32 03/23/2022 0919   ALKPHOS 100 03/23/2022 0919   BILITOT 1.3 (H) 03/23/2022 0919   BILIDIR 0.2 04/27/2019 1223      Component Value Date/Time   TSH 1.170 06/09/2021 0915   Nutritional Lab Results  Component Value Date   VD25OH 34 10/29/2021   VD25OH 12.8 (L) 06/09/2021     ASSESSMENT AND PLAN  TREATMENT PLAN FOR OBESITY:  Recommended Dietary Goals  Laurie Sutton is currently in the action stage of change. As such, her goal is to continue weight management plan. She has agreed to keeping a food journal and adhering to recommended goals of 1800 calories and 100 grams protein.  Behavioral Intervention  We discussed the following Behavioral Modification Strategies today: increasing lean protein intake, decreasing simple carbohydrates , increasing vegetables, increasing lower glycemic fruits, avoiding skipping meals, increasing water intake, keeping healthy foods at home, identifying sources and decreasing liquid calories, continue to practice mindfulness when eating, and planning for success.  Additional resources provided today: NA  Recommended Physical Activity Goals  Laurie Sutton has been advised to work up to 150 minutes of moderate intensity aerobic activity a week and strengthening exercises 2-3 times per week for cardiovascular health, weight loss maintenance and preservation of muscle mass.   She has agreed to Think about ways to increase daily  physical activity and overcoming barriers to exercise and Increase physical activity in their day and reduce sedentary time (increase NEAT).    ASSOCIATED CONDITIONS ADDRESSED TODAY  Action/Plan  Vitamin D deficiency -     Vitamin D (Ergocalciferol); Take 1 capsule (50,000 Units total) by mouth every 7 (seven) days.  Dispense: 4 capsule; Refill: 0  Type 2 diabetes mellitus with other specified complication, with long-term current use of insulin (HCC) Continue to follow up with Endo. Continue meds as directed  Generalized obesity: Starting BMI 36  BMI 34.0-34.9,adult      Labs at next visit if not obtained at PCP's office-Vit D, lipids, TSH   Return in about 4 weeks (around 11/03/2022).Marland Kitchen She was informed of the importance of frequent follow up visits to maximize her success with intensive  lifestyle modifications for her multiple health conditions.   ATTESTASTION STATEMENTS:  Reviewed by clinician on day of visit: allergies, medications, problem list, medical history, surgical history, family history, social history, and previous encounter notes.     Theodis Sato. Javell Blackburn FNP-C

## 2022-10-07 ENCOUNTER — Encounter: Payer: Self-pay | Admitting: Family Medicine

## 2022-10-07 ENCOUNTER — Ambulatory Visit: Payer: BC Managed Care – PPO | Admitting: Family Medicine

## 2022-10-07 VITALS — BP 110/70 | HR 81 | Temp 98.3°F | Wt 217.0 lb

## 2022-10-07 DIAGNOSIS — N390 Urinary tract infection, site not specified: Secondary | ICD-10-CM

## 2022-10-07 DIAGNOSIS — R319 Hematuria, unspecified: Secondary | ICD-10-CM

## 2022-10-07 DIAGNOSIS — H6123 Impacted cerumen, bilateral: Secondary | ICD-10-CM | POA: Diagnosis not present

## 2022-10-07 LAB — POC URINALSYSI DIPSTICK (AUTOMATED)
Glucose, UA: NEGATIVE
Nitrite, UA: NEGATIVE
Protein, UA: POSITIVE — AB
Spec Grav, UA: 1.025 (ref 1.010–1.025)
Urobilinogen, UA: 1 E.U./dL
pH, UA: 5.5 (ref 5.0–8.0)

## 2022-10-07 NOTE — Progress Notes (Signed)
   Subjective:    Patient ID: Laurie Sutton, female    DOB: [birth year], 42 y.o.   MRN: [id number]  HPI Here for 2 issues. First she thinks her UTI has come back. We saw her on 09-20-22 for urinary urgency and burning, and we started her on Cipro. Her culture came back as "no growth", so she stopped taking the Cipro after 3 days. Her symptoms resolved, but then they returned yesterday. She also has left flank pain. No fever. She is drinking plenty of water. The other issue is pressure and decreased hearing in both ears for the past few weeks. No ear pain.    Review of Systems  Constitutional: Negative.   HENT:  Positive for hearing loss. Negative for congestion, ear pain, postnasal drip, sinus pressure and sore throat.   Eyes: Negative.   Respiratory: Negative.    Genitourinary:  Positive for dysuria, flank pain, frequency and urgency. Negative for hematuria.       Objective:   Physical Exam Constitutional:      Appearance: Normal appearance. She is not ill-appearing.  HENT:     Right Ear: There is impacted cerumen.     Left Ear: There is impacted cerumen.     Nose: Nose normal.     Mouth/Throat:     Pharynx: Oropharynx is clear.  Eyes:     Conjunctiva/sclera: Conjunctivae normal.  Cardiovascular:     Rate and Rhythm: Normal rate and regular rhythm.     Pulses: Normal pulses.     Heart sounds: Normal heart sounds.  Pulmonary:     Effort: Pulmonary effort is normal.     Breath sounds: Normal breath sounds.  Abdominal:     Tenderness: There is no right CVA tenderness or left CVA tenderness.  Neurological:     Mental Status: She is alert.           Assessment & Plan:  Partially treated UTI. We will send the sample for another culture. Treat with 10 days of Cipro. I told her to take all the Cipro even if the culture comes back as no growth. She also has cerumen impactions in both ears. After informed consent was obtained, we attempted to irrigate the canals with water.  This caused some mild pain in the right ear and she became mildly nauseated and dizzy. We immediately stopped the irrigation, and we will refer her to ENT for further treatment.  Gershon Crane, MD

## 2022-10-08 LAB — URINE CULTURE
MICRO NUMBER:: 15135666
Result:: NO GROWTH
SPECIMEN QUALITY:: ADEQUATE

## 2022-10-13 ENCOUNTER — Telehealth: Payer: Self-pay | Admitting: Nurse Practitioner

## 2022-10-13 ENCOUNTER — Other Ambulatory Visit: Payer: Self-pay | Admitting: Nurse Practitioner

## 2022-10-13 DIAGNOSIS — R110 Nausea: Secondary | ICD-10-CM

## 2022-10-13 NOTE — Telephone Encounter (Signed)
Pt stated that the [facility]  didn't received her rx on 06/26 when she was seen in the office. Pt need a refill on Zofran. She is leaving out of town by 2 pm today.

## 2022-10-21 ENCOUNTER — Other Ambulatory Visit: Payer: Self-pay | Admitting: Family Medicine

## 2022-10-24 ENCOUNTER — Other Ambulatory Visit: Payer: Self-pay | Admitting: Neurology

## 2022-10-28 ENCOUNTER — Encounter: Payer: Self-pay | Admitting: Gastroenterology

## 2022-10-28 ENCOUNTER — Other Ambulatory Visit: Payer: Self-pay | Admitting: Nurse Practitioner

## 2022-10-28 DIAGNOSIS — E559 Vitamin D deficiency, unspecified: Secondary | ICD-10-CM

## 2022-10-29 ENCOUNTER — Other Ambulatory Visit: Payer: Self-pay

## 2022-10-29 ENCOUNTER — Other Ambulatory Visit (HOSPITAL_COMMUNITY): Payer: Self-pay

## 2022-11-01 ENCOUNTER — Ambulatory Visit: Payer: BC Managed Care – PPO | Admitting: Nurse Practitioner

## 2022-11-01 ENCOUNTER — Encounter: Payer: Self-pay | Admitting: Nurse Practitioner

## 2022-11-01 VITALS — BP 108/67 | HR 77 | Temp 98.6°F | Ht 66.0 in | Wt 213.0 lb

## 2022-11-01 DIAGNOSIS — E669 Obesity, unspecified: Secondary | ICD-10-CM | POA: Diagnosis not present

## 2022-11-01 DIAGNOSIS — Z6834 Body mass index (BMI) 34.0-34.9, adult: Secondary | ICD-10-CM | POA: Diagnosis not present

## 2022-11-01 DIAGNOSIS — Z794 Long term (current) use of insulin: Secondary | ICD-10-CM

## 2022-11-01 DIAGNOSIS — E1169 Type 2 diabetes mellitus with other specified complication: Secondary | ICD-10-CM | POA: Diagnosis not present

## 2022-11-01 NOTE — Progress Notes (Signed)
Office: [phone]  /  Fax: [phone]  WEIGHT SUMMARY AND BIOMETRICS  Weight Lost Since Last Visit: 1lb  Weight Gained Since Last Visit: 0lb   Vitals Temp: 98.6 F (37 C) BP: 108/67 Pulse Rate: 77 SpO2: 98 %   Anthropometric Measurements Height: 5\' 6"  (1.676 m) Weight: 213 lb (96.6 kg) BMI (Calculated): 34.4 Weight at Last Visit: 214lb Weight Lost Since Last Visit: 1lb Weight Gained Since Last Visit: 0lb Starting Weight: 230lb Total Weight Loss (lbs): 17 lb (7.711 kg)   Body Composition  Body Fat %: 43.2 % Fat Mass (lbs): 92.2 lbs Muscle Mass (lbs): 115.2 lbs Total Body Water (lbs): 75.8 lbs Visceral Fat Rating : 10   Other Clinical Data Fasting: No Labs: no Today's Visit #: 20 Starting Date: 06/09/21     HPI  Chief Complaint: OBESITY  Laurie Sutton is here to discuss her progress with her obesity treatment plan. She is on the keeping a food journal and adhering to recommended goals of 1800 calories and 100+ protein and states she is following her eating plan approximately  0  % of the time. She states she is exercising 0 minutes 0 days per week.   Interval History:  Since last office visit she has lost 1 pound.  Has struggled with meeting calories/protein goals. Since having covid she can't taste most things.  She can taste sweets and she tends to eat more sweets.     BF:  life cereal and 2% milk Snack:  beef jerky, tootsie pops Lunch:  skips Snack:  beef jerky, tootsie pops, peanut butter crackers, peanuts Dinner:  skips or protein with side  Drinks:  water, diet sodas  Pharmacotherapy for weight loss: She is not currently taking medications  for medical weight loss.   Previous pharmacotherapy for medical weight loss:  Phentermine   Bariatric surgery:   Patient has not had bariatric surgery. Was interested but was not consider a candidate. If she decides she would like to proceed with surgery, will send to [facility] for further evaluation.     Pharmacotherapy for DMT2:  She is currently taking Humulin breakfast dose is 60 units, lunch 70 units and dinner is 70 units. If she skips a meal she will take 1/2 dose. She is taking Mounjaro 5mg  prescribed by endo after her last visit on June 18th.    Last A1c was 11.1 on June 18th CBGs: Fasting 296 and 2 hours PP 350+ Episodes of hypoglycemia: no She is not on an ACE or ARB, ASA 81mg  and statin.  Last eye exam:  2023-scheduled the end of August She has tried Metformin-stopped due to side effects of body aches, glipizide, januvia-stopped due to abd pain/cramping, Ozempic-nausea, Trulicity-nausea, in the past.   She is scheduled to see endo back for follow up on August 2nd     PHYSICAL EXAM:  Blood pressure 108/67, pulse 77, temperature 98.6 F (37 C), height 5\' 6"  (1.676 m), weight 213 lb (96.6 kg), last menstrual period 11/13/2020, SpO2 98%. Body mass index is 34.38 kg/m.  General: She is overweight, cooperative, alert, well developed, and in no acute distress. PSYCH: Has normal mood, affect and thought process.   Extremities: No edema.  Neurologic: No gross sensory or motor deficits. No tremors or fasciculations noted.    DIAGNOSTIC DATA REVIEWED:  BMET    Component Value Date/Time   NA 135 03/23/2022 0919   NA 138 08/19/2017 0000   K 3.4 (L) 03/23/2022 0919   CL 101 03/23/2022 0919  CO2 25 03/23/2022 0919   GLUCOSE 191 (H) 03/23/2022 0919   BUN 7 03/23/2022 0919   BUN 8 08/19/2017 0000   CREATININE 0.46 03/23/2022 0919   CREATININE 0.42 (L) 10/29/2021 0848   CALCIUM 8.9 03/23/2022 0919   GFRNONAA >60 06/08/2021 0910   GFRAA >60 08/09/2019 0459   Lab Results  Component Value Date   HGBA1C 9.1 (A) 04/08/2022   HGBA1C 12.0 (H) 11/12/2013   No results found for: "INSULIN" Lab Results  Component Value Date   TSH 1.170 06/09/2021   CBC    Component Value Date/Time   WBC 4.0 03/23/2022 0919   RBC 4.69 03/23/2022 0919   HGB 13.3 03/23/2022 0919   HCT 39.5  03/23/2022 0919   PLT 91.0 (L) 03/23/2022 0919   MCV 84.3 03/23/2022 0919   MCH 28.6 10/29/2021 0848   MCHC 33.6 03/23/2022 0919   RDW 16.2 (H) 03/23/2022 0919   Iron Studies No results found for: "IRON", "TIBC", "FERRITIN", "IRONPCTSAT" Lipid Panel     Component Value Date/Time   CHOL 169 08/19/2017 0000   TRIG 202 (A) 08/19/2017 0000   HDL 38 08/19/2017 0000   CHOLHDL 4 05/12/2015 1042   VLDL 37.8 05/12/2015 1042   LDLCALC 91 08/19/2017 0000   Hepatic Function Panel     Component Value Date/Time   PROT 7.1 03/23/2022 0919   ALBUMIN 3.8 03/23/2022 0919   AST 41 (H) 03/23/2022 0919   ALT 32 03/23/2022 0919   ALKPHOS 100 03/23/2022 0919   BILITOT 1.3 (H) 03/23/2022 0919   BILIDIR 0.2 04/27/2019 1223      Component Value Date/Time   TSH 1.170 06/09/2021 0915   Nutritional Lab Results  Component Value Date   VD25OH 34 10/29/2021   VD25OH 12.8 (L) 06/09/2021     ASSESSMENT AND PLAN  TREATMENT PLAN FOR OBESITY:  Recommended Dietary Goals  Matricia is currently in the action stage of change. As such, her goal is to continue weight management plan. She has agreed to keeping a food journal and adhering to recommended goals of 1800 calories and 90+ protein. Discussed extensively the importance of eating calories and protein goals/limiting sweets/carbs.  Bio impedence reviewed with patient today.  Body fat% is increasing and muscle mass is decreasing.    Behavioral Intervention  We discussed the following Behavioral Modification Strategies today: increasing lean protein intake, decreasing simple carbohydrates , increasing vegetables, increasing lower glycemic fruits, increasing fiber rich foods, avoiding skipping meals, increasing water intake, continue to practice mindfulness when eating, and planning for success.  Additional resources provided today: Protein content of food  Recommended Physical Activity Goals  Landrie has been advised to work up to 150 minutes of  moderate intensity aerobic activity a week and strengthening exercises 2-3 times per week for cardiovascular health, weight loss maintenance and preservation of muscle mass.   She has agreed to Think about ways to increase daily physical activity and overcoming barriers to exercise and Increase physical activity in their day and reduce sedentary time (increase NEAT).    ASSOCIATED CONDITIONS ADDRESSED TODAY  Action/Plan  Type 2 diabetes mellitus with other specified complication, with long-term current use of insulin (HCC) Continue to follow up with endo. Continue meds as directed  Generalized obesity: Starting BMI 36  BMI 34.0-34.9,adult      Will obtain IC at next visit Labs at next visit if not obtained at PCP's office-Vit D, lipids, TSH    Return in about 3 weeks (around 11/22/2022) for IC.Marland Kitchen  She was informed of the importance of frequent follow up visits to maximize her success with intensive lifestyle modifications for her multiple health conditions.   ATTESTASTION STATEMENTS:  Reviewed by clinician on day of visit: allergies, medications, problem list, medical history, surgical history, family history, social history, and previous encounter notes.   Time spent on visit including pre-visit chart review and post-visit care and charting was 30 minutes.    Theodis Sato. Alexavier Tsutsui FNP-C

## 2022-11-04 ENCOUNTER — Other Ambulatory Visit: Payer: Self-pay | Admitting: Gastroenterology

## 2022-11-08 ENCOUNTER — Encounter: Payer: Self-pay | Admitting: Family Medicine

## 2022-11-08 ENCOUNTER — Ambulatory Visit (INDEPENDENT_AMBULATORY_CARE_PROVIDER_SITE_OTHER): Payer: BC Managed Care – PPO | Admitting: Family Medicine

## 2022-11-08 VITALS — BP 126/80 | HR 92 | Temp 98.3°F | Ht 66.0 in | Wt 217.6 lb

## 2022-11-08 DIAGNOSIS — F418 Other specified anxiety disorders: Secondary | ICD-10-CM

## 2022-11-08 MED ORDER — GABAPENTIN 100 MG PO CAPS: 100.0000 mg | ORAL_CAPSULE | Freq: Two times a day (BID) | ORAL | Status: DC

## 2022-11-08 MED ORDER — LORAZEPAM 0.5 MG PO TABS: 0.5000 mg | ORAL_TABLET | Freq: Two times a day (BID) | ORAL | 2 refills | Status: DC | PRN

## 2022-11-08 MED ORDER — FLUOXETINE HCL 40 MG PO CAPS: 40.0000 mg | ORAL_CAPSULE | Freq: Two times a day (BID) | ORAL | Status: DC

## 2022-11-08 NOTE — Progress Notes (Signed)
   Subjective:    Patient ID: Laurie Sutton, female    DOB: 08/15/1980, 42 y.o.   MRN: [id number]  HPI Here for her anxiety. She has been taking Prozac 40 mg a day for about 5 years, and it worked well for her until about 6 months ago. Lately she has been stressed with taking care of her father who had a mild stroke and with helping her daughter through a breakup with her boyfriend. She has tried taking Xanax here and there, but it is too strong and makes her "feel funny".     Review of Systems  Constitutional: Negative.   Respiratory: Negative.    Cardiovascular: Negative.   Psychiatric/Behavioral:  Negative for agitation, behavioral problems, confusion, dysphoric mood, hallucinations and sleep disturbance. The patient is nervous/anxious.        Objective:   Physical Exam Constitutional:      Appearance: Normal appearance.  Cardiovascular:     Rate and Rhythm: Normal rate and regular rhythm.     Pulses: Normal pulses.     Heart sounds: Normal heart sounds.  Pulmonary:     Effort: Pulmonary effort is normal.     Breath sounds: Normal breath sounds.  Neurological:     General: No focal deficit present.     Mental Status: She is alert and oriented to person, place, and time.  Psychiatric:        Mood and Affect: Mood normal.        Behavior: Behavior normal.        Thought Content: Thought content normal.           Assessment & Plan:  For her anxiety, we will increase the Prozac to 40 mg BID. She can also try Lorazepam as needed instead of Alprazolam.  Gershon Crane, MD

## 2022-11-09 ENCOUNTER — Ambulatory Visit: Payer: BC Managed Care – PPO | Admitting: Family Medicine

## 2022-11-11 NOTE — Progress Notes (Deleted)
Office Visit Note  Patient: Laurie Sutton             Date of Birth: 06-03-1980           MRN: [id number]             PCP: Nelwyn Salisbury, MD Referring: Nelwyn Salisbury, MD Visit Date: 11/25/2022 Occupation: @GUAROCC @  Subjective:  No chief complaint on file.   History of Present Illness: Laurie Sutton is a 42 y.o. female ***     Activities of Daily Living:  Patient reports morning stiffness for *** {minute/hour:19697}.   Patient {ACTIONS;DENIES/REPORTS:21021675::"Denies"} nocturnal pain.  Difficulty dressing/grooming: {ACTIONS;DENIES/REPORTS:21021675::"Denies"} Difficulty climbing stairs: {ACTIONS;DENIES/REPORTS:21021675::"Denies"} Difficulty getting out of chair: {ACTIONS;DENIES/REPORTS:21021675::"Denies"} Difficulty using hands for taps, buttons, cutlery, and/or writing: {ACTIONS;DENIES/REPORTS:21021675::"Denies"}  No Rheumatology ROS completed.   PMFS History:  Patient Active Problem List   Diagnosis Date Noted   Nausea 03/09/2022   Morbid obesity (HCC) 03/09/2022   Hydradenitis 01/12/2022   Vitamin D deficiency 08/24/2021   Diabetes mellitus (HCC) 06/09/2021   Thrombocytopenia (HCC) 01/07/2021   Liver cirrhosis secondary to NASH (nonalcoholic steatohepatitis) (HCC) 01/07/2021   Splenomegaly, congestive, chronic 01/07/2021   Leukopenia 01/07/2021   Type 2 diabetes mellitus treated with insulin (HCC) 01/07/2021   Generalized obesity 01/07/2021   Menorrhagia 11/17/2020   S/P laparoscopic assisted vaginal hysterectomy (LAVH) 11/17/2020   COVID-19 virus infection 08/07/2019   Allergic rhinitis 07/25/2017   Hepatic steatosis 06/09/2016   Diabetes mellitus without complication (HCC) 03/18/2014   GERD (gastroesophageal reflux disease) 01/22/2014   Low back pain 11/12/2013   Migraine headache 08/06/2013   Frequent UTI 02/29/2012   Migraines 02/16/2011   Depression with anxiety 02/16/2011    Past Medical History:  Diagnosis Date   ADHD    Anemia    IRON  TRANSFUSION 07-2020 NONE SINCE   Anxiety    Back pain    COVID 08/12/2019   ALL SYMPTOMS REOLVED PER PT   Depression    Diabetic neuropathy (HCC) 11/11/2020   FEET   dm type 2    sees Dr. Ocie Cornfield at [facility]   Fatty liver    GERD (gastroesophageal reflux disease)    History of kidney stones    Joint pain    Menorrhagia 11/11/2020   Migraines    Murmur, cardiac    FAINT NO CARDIOLOGIST   Other fatigue    Pneumonia 08/12/2019   COVID PNEUMONIA ALL SYMPTOMS RESOLVED PER PT   Shortness of breath on exertion     Family History  Problem Relation Age of Onset   Depression Mother    Anxiety disorder Mother    Sleep apnea Father    Anxiety disorder Father    Depression Father    Diabetes Father    Bladder Cancer Father 68   Rheum arthritis Father    Migraines Maternal Grandmother    Diabetes Maternal Grandfather    Healthy Daughter    Asthma Daughter    Anxiety disorder Daughter    Anxiety disorder Son    Asthma Son    Healthy Son    Migraines Maternal Aunt    Cerebral aneurysm Cousin    Heart disease Other    Depression Other    Anxiety disorder Other    Sleep apnea Other    Colon cancer Neg Hx    Esophageal cancer Neg Hx    Stomach cancer Neg Hx    Rectal cancer Neg Hx    Past Surgical History:  Procedure Laterality Date   CESAREAN SECTION  12/13/2006   EXTRACORPOREAL SHOCK WAVE LITHOTRIPSY  2018   FOOT SURGERY Right 10/02/2020   RIGHT FOOT HEEL AND TOES, SURGICAL CENTER OFF [address]   LAPAROSCOPIC VAGINAL HYSTERECTOMY WITH SALPINGECTOMY Bilateral 11/17/2020   Procedure: LAPAROSCOPIC ASSISTED VAGINAL HYSTERECTOMY WITH BILATERAL SALPINGECTOMY;  Surgeon: Mitchel Honour, DO;  Location:  SURGERY CENTER;  Service: Gynecology;  Laterality: Bilateral;   Social History   Social History Narrative   Not on file   Immunization History  Administered Date(s) Administered   Hepb-cpg 06/12/2021, 07/13/2021   PFIZER(Purple Top)SARS-COV-2  Vaccination 11/25/2019, 12/23/2019   PPD Test 01/12/2016     Objective: Vital Signs: LMP 11/13/2020 (Exact Date)    Physical Exam   Musculoskeletal Exam: ***  CDAI Exam: CDAI Score: -- Patient Global: --; Provider Global: -- Swollen: --; Tender: -- Joint Exam 11/25/2022   No joint exam has been documented for this visit   There is currently no information documented on the homunculus. Go to the Rheumatology activity and complete the homunculus joint exam.  Investigation: No additional findings.  Imaging: No results found.  Recent Labs: Lab Results  Component Value Date   WBC 4.0 03/23/2022   HGB 13.3 03/23/2022   PLT 91.0 (L) 03/23/2022   NA 135 03/23/2022   K 3.4 (L) 03/23/2022   CL 101 03/23/2022   CO2 25 03/23/2022   GLUCOSE 191 (H) 03/23/2022   BUN 7 03/23/2022   CREATININE 0.46 03/23/2022   BILITOT 1.3 (H) 03/23/2022   ALKPHOS 100 03/23/2022   AST 41 (H) 03/23/2022   ALT 32 03/23/2022   PROT 7.1 03/23/2022   ALBUMIN 3.8 03/23/2022   CALCIUM 8.9 03/23/2022   GFRAA >60 08/09/2019    Speciality Comments: No specialty comments available.  Procedures:  No procedures performed Allergies: Amoxicillin, Azithromycin, Dulaglutide, Januvia [sitagliptin], Latex, and Penicillins   Assessment / Plan:     Visit Diagnoses: No diagnosis found.  Orders: No orders of the defined types were placed in this encounter.  No orders of the defined types were placed in this encounter.   Face-to-face time spent with patient was *** minutes. Greater than 50% of time was spent in counseling and coordination of care.  Follow-Up Instructions: No follow-ups on file.   Ellen Henri, CMA  Note - This record has been created using Animal nutritionist.  Chart creation errors have been sought, but may not always  have been located. Such creation errors do not reflect on  the standard of medical care.

## 2022-11-25 ENCOUNTER — Ambulatory Visit: Payer: Commercial Managed Care - PPO | Admitting: Rheumatology

## 2022-11-25 DIAGNOSIS — K5909 Other constipation: Secondary | ICD-10-CM

## 2022-11-25 DIAGNOSIS — E559 Vitamin D deficiency, unspecified: Secondary | ICD-10-CM

## 2022-11-25 DIAGNOSIS — Z6837 Body mass index (BMI) 37.0-37.9, adult: Secondary | ICD-10-CM

## 2022-11-25 DIAGNOSIS — Z8261 Family history of arthritis: Secondary | ICD-10-CM

## 2022-11-25 DIAGNOSIS — F418 Other specified anxiety disorders: Secondary | ICD-10-CM

## 2022-11-25 DIAGNOSIS — Z8669 Personal history of other diseases of the nervous system and sense organs: Secondary | ICD-10-CM

## 2022-11-25 DIAGNOSIS — K7460 Unspecified cirrhosis of liver: Secondary | ICD-10-CM

## 2022-11-25 DIAGNOSIS — D708 Other neutropenia: Secondary | ICD-10-CM

## 2022-11-25 DIAGNOSIS — E119 Type 2 diabetes mellitus without complications: Secondary | ICD-10-CM

## 2022-11-25 DIAGNOSIS — K760 Fatty (change of) liver, not elsewhere classified: Secondary | ICD-10-CM

## 2022-11-25 DIAGNOSIS — D696 Thrombocytopenia, unspecified: Secondary | ICD-10-CM

## 2022-11-25 DIAGNOSIS — N390 Urinary tract infection, site not specified: Secondary | ICD-10-CM

## 2022-11-25 DIAGNOSIS — Z8719 Personal history of other diseases of the digestive system: Secondary | ICD-10-CM

## 2022-11-25 DIAGNOSIS — D732 Chronic congestive splenomegaly: Secondary | ICD-10-CM

## 2022-11-25 DIAGNOSIS — R768 Other specified abnormal immunological findings in serum: Secondary | ICD-10-CM

## 2022-11-26 ENCOUNTER — Other Ambulatory Visit: Payer: Self-pay

## 2022-11-30 ENCOUNTER — Encounter: Payer: Self-pay | Admitting: Family Medicine

## 2022-12-01 ENCOUNTER — Ambulatory Visit: Payer: BC Managed Care – PPO | Admitting: Nurse Practitioner

## 2022-12-01 ENCOUNTER — Other Ambulatory Visit: Payer: Self-pay

## 2022-12-01 ENCOUNTER — Encounter: Payer: Self-pay | Admitting: Nurse Practitioner

## 2022-12-01 VITALS — BP 106/66 | HR 75 | Temp 98.1°F | Ht 66.0 in | Wt 217.0 lb

## 2022-12-01 DIAGNOSIS — Z794 Long term (current) use of insulin: Secondary | ICD-10-CM | POA: Diagnosis not present

## 2022-12-01 DIAGNOSIS — E1169 Type 2 diabetes mellitus with other specified complication: Secondary | ICD-10-CM | POA: Diagnosis not present

## 2022-12-01 DIAGNOSIS — Z6835 Body mass index (BMI) 35.0-35.9, adult: Secondary | ICD-10-CM

## 2022-12-01 DIAGNOSIS — Z7985 Long-term (current) use of injectable non-insulin antidiabetic drugs: Secondary | ICD-10-CM

## 2022-12-01 DIAGNOSIS — E669 Obesity, unspecified: Secondary | ICD-10-CM | POA: Diagnosis not present

## 2022-12-01 MED ORDER — TIRZEPATIDE 7.5 MG/0.5ML ~~LOC~~ SOAJ
7.5000 mg | SUBCUTANEOUS | 0 refills | Status: DC
Start: 2022-12-01 — End: 2023-02-02
  Filled 2022-12-01 – 2022-12-30 (×4): qty 2, 28d supply, fill #0

## 2022-12-01 NOTE — Progress Notes (Signed)
Office: [phone]  /  Fax: [phone]  WEIGHT SUMMARY AND BIOMETRICS  Weight Lost Since Last Visit: 0lb  Weight Gained Since Last Visit: 4lb   Vitals Temp: 98.1 F (36.7 C) BP: 106/66 Pulse Rate: 75 SpO2: 98 %   Anthropometric Measurements Height: 5\' 6"  (1.676 m) Weight: 217 lb (98.4 kg) BMI (Calculated): 35.04 Weight at Last Visit: 213lb Weight Lost Since Last Visit: 0lb Weight Gained Since Last Visit: 4lb Starting Weight: 230lb Total Weight Loss (lbs): 13 lb (5.897 kg)   Body Composition  Body Fat %: 43.1 % Fat Mass (lbs): 93.6 lbs Muscle Mass (lbs): 117.6 lbs Total Body Water (lbs): 79 lbs Visceral Fat Rating : 10   Other Clinical Data Fasting: No Labs: No Today's Visit #: 21 Starting Date: 06/09/21     HPI  Chief Complaint: OBESITY  Laurie Sutton is here to discuss her progress with her obesity treatment plan. She is on the keeping a food journal and adhering to recommended goals of 1800 calories and 100+ protein and states she is following her eating plan approximately 0 % of the time. She states she is exercising 0 minutes 0 days per week.   Interval History:  Since last office visit she has gained 4 pounds. She went on vacation since her last visit.   She is trying to make healthier choices, eating protein with each meal and limiting snacking.  She drinking water and diet sodas.  She starts back to work tomorrow and notes she will be more active.     Pharmacotherapy for weight loss: She is not currently taking medications  for medical weight loss.    Previous pharmacotherapy for medical weight loss:  Phentermine    Bariatric surgery:   Patient has not had bariatric surgery. Was interested but was not consider a candidate. If she decides she would like to proceed with surgery, will send to [facility] for further evaluation.   Pharmacotherapy for DMT2:  She is currently taking Humulin breakfast dose is 80 units, lunch 80 units and dinner is 80 units. She  is taking Mounjaro 5mg  prescribed by endo after her last visit on June 18th.   Last A1c was 11.1 on June 18th CBGs: average 295 Episodes of hypoglycemia: no She is not on an ACE or ARB, ASA 81mg  and statin.  Last eye exam:  2023-scheduled the end of August She has tried Metformin-stopped due to side effects of body aches,  glipizide,  januvia-stopped due to abd pain/cramping,  Ozempic-nausea,  Trulicity-nausea, in the past.   She saw endo on August 2nd.  Has follow up appt on Nov 6th.        PHYSICAL EXAM:  Blood pressure 106/66, pulse 75, temperature 98.1 F (36.7 C), height 5\' 6"  (1.676 m), weight 217 lb (98.4 kg), last menstrual period 11/13/2020, SpO2 98%. Body mass index is 35.02 kg/m.  General: She is overweight, cooperative, alert, well developed, and in no acute distress. PSYCH: Has normal mood, affect and thought process.   Extremities: No edema.  Neurologic: No gross sensory or motor deficits. No tremors or fasciculations noted.    DIAGNOSTIC DATA REVIEWED:  BMET    Component Value Date/Time   NA 135 03/23/2022 0919   NA 138 08/19/2017 0000   K 3.4 (L) 03/23/2022 0919   CL 101 03/23/2022 0919   CO2 25 03/23/2022 0919   GLUCOSE 191 (H) 03/23/2022 0919   BUN 7 03/23/2022 0919   BUN 8 08/19/2017 0000   CREATININE 0.46 03/23/2022  0919   CREATININE 0.42 (L) 10/29/2021 0848   CALCIUM 8.9 03/23/2022 0919   GFRNONAA >60 06/08/2021 0910   GFRAA >60 08/09/2019 0459   Lab Results  Component Value Date   HGBA1C 11.1 09/28/2022   HGBA1C 12.0 (H) 11/12/2013   No results found for: "INSULIN" Lab Results  Component Value Date   TSH 1.170 06/09/2021   CBC    Component Value Date/Time   WBC 4.0 03/23/2022 0919   RBC 4.69 03/23/2022 0919   HGB 13.3 03/23/2022 0919   HCT 39.5 03/23/2022 0919   PLT 91.0 (L) 03/23/2022 0919   MCV 84.3 03/23/2022 0919   MCH 28.6 10/29/2021 0848   MCHC 33.6 03/23/2022 0919   RDW 16.2 (H) 03/23/2022 0919   Iron Studies No  results found for: "IRON", "TIBC", "FERRITIN", "IRONPCTSAT" Lipid Panel     Component Value Date/Time   CHOL 169 08/19/2017 0000   TRIG 202 (A) 08/19/2017 0000   HDL 38 08/19/2017 0000   CHOLHDL 4 05/12/2015 1042   VLDL 37.8 05/12/2015 1042   LDLCALC 91 08/19/2017 0000   Hepatic Function Panel     Component Value Date/Time   PROT 7.1 03/23/2022 0919   ALBUMIN 3.8 03/23/2022 0919   AST 41 (H) 03/23/2022 0919   ALT 32 03/23/2022 0919   ALKPHOS 100 03/23/2022 0919   BILITOT 1.3 (H) 03/23/2022 0919   BILIDIR 0.2 04/27/2019 1223      Component Value Date/Time   TSH 1.170 06/09/2021 0915   Nutritional Lab Results  Component Value Date   VD25OH 34 10/29/2021   VD25OH 12.8 (L) 06/09/2021     ASSESSMENT AND PLAN  TREATMENT PLAN FOR OBESITY:  Recommended Dietary Goals  Laurie Sutton is currently in the action stage of change. As such, her goal is to continue weight management plan. She has agreed to keeping a food journal and adhering to recommended goals of 1800 calories and 100+ protein.  Behavioral Intervention  We discussed the following Behavioral Modification Strategies today: increasing lean protein intake, decreasing simple carbohydrates , increasing vegetables, increasing lower glycemic fruits, increasing water intake, work on meal planning and preparation, reading food labels , keeping healthy foods at home, continue to practice mindfulness when eating, and planning for success.  Additional resources provided today: NA  Recommended Physical Activity Goals  Laurie Sutton has been advised to work up to 150 minutes of moderate intensity aerobic activity a week and strengthening exercises 2-3 times per week for cardiovascular health, weight loss maintenance and preservation of muscle mass.   She has agreed to Think about ways to increase daily physical activity and overcoming barriers to exercise and Increase physical activity in their day and reduce sedentary time (increase  NEAT).   ASSOCIATED CONDITIONS ADDRESSED TODAY  Action/Plan  Type 2 diabetes mellitus with other specified complication, with long-term current use of insulin (HCC) -     Increase Tirzepatide; Inject 7.5 mg into the skin once a week.  Dispense: 2 mL; Refill: 0.  Side effects discussed.  To monitor for hypoglycemia.    Good blood sugar control is important to decrease the likelihood of diabetic complications such as nephropathy, neuropathy, limb loss, blindness, coronary artery disease, and death. Intensive lifestyle modification including diet, exercise and weight loss are the first line of treatment for diabetes.    Generalized obesity: Starting BMI 36  BMI 35.0-35.9,adult         Return in about 4 weeks (around 12/29/2022).Marland Kitchen She was informed of the importance of frequent  follow up visits to maximize her success with intensive lifestyle modifications for her multiple health conditions.   ATTESTASTION STATEMENTS:  Reviewed by clinician on day of visit: allergies, medications, problem list, medical history, surgical history, family history, social history, and previous encounter notes.     Theodis Sato. Caryle Helgeson FNP-C

## 2022-12-02 ENCOUNTER — Encounter: Payer: Self-pay | Admitting: Family Medicine

## 2022-12-02 ENCOUNTER — Ambulatory Visit: Payer: BC Managed Care – PPO | Admitting: Family Medicine

## 2022-12-02 ENCOUNTER — Ambulatory Visit (INDEPENDENT_AMBULATORY_CARE_PROVIDER_SITE_OTHER): Payer: BC Managed Care – PPO | Admitting: Family Medicine

## 2022-12-02 VITALS — BP 126/70 | HR 85 | Temp 98.5°F | Ht 66.0 in | Wt 224.4 lb

## 2022-12-02 DIAGNOSIS — K047 Periapical abscess without sinus: Secondary | ICD-10-CM | POA: Diagnosis not present

## 2022-12-02 DIAGNOSIS — R519 Headache, unspecified: Secondary | ICD-10-CM

## 2022-12-02 DIAGNOSIS — R6889 Other general symptoms and signs: Secondary | ICD-10-CM

## 2022-12-02 MED ORDER — CEPHALEXIN 500 MG PO CAPS: 500.0000 mg | ORAL_CAPSULE | Freq: Two times a day (BID) | ORAL | 0 refills | Status: AC

## 2022-12-02 NOTE — Progress Notes (Signed)
Established Patient Office Visit   Subjective  Patient ID: Laurie Sutton, female    DOB: 1980-06-11  Age: 42 y.o. MRN: [id number]  Chief Complaint  Patient presents with   Facial Pain    Patient came in today for left side face pain, started a week ago, nose and ear, patient had recent dental work     Patient is a 42 year old female followed with Dr. Clent Ridges and seen for ongoing concern.  Patient endorses having dental work on right side of mouth including fillings of cavities 3 weeks ago.  After dental work pt noticed pain in right cheek that moved to her nose, head, ear, and jaw.  Patient was given a prescription for doxycycline 100 mg twice daily for abscesses and axilla by another provider.  Patient states facial pain seem to improve but has not resolved.  Patient is also taking ibuprofen and put ice on her face.  Patient has a dental appointment next Wednesday or Thursday to fill cavities on left side of mouth.  Patient denies fever, chills, nasal congestion, rhinorrhea.  Reviewed allergy list with patient, thinks she had azithromycin before without issues penicillin and amoxicillin caused hives.  Endorses taking Keflex in the past without issues.    Past Medical History:  Diagnosis Date   ADHD    Anemia    IRON TRANSFUSION 07-2020 NONE SINCE   Anxiety    Back pain    COVID 08/12/2019   ALL SYMPTOMS REOLVED PER PT   Depression    Diabetic neuropathy (HCC) 11/11/2020   FEET   dm type 2    sees Dr. Ocie Cornfield at [facility]   Fatty liver    GERD (gastroesophageal reflux disease)    History of kidney stones    Joint pain    Menorrhagia 11/11/2020   Migraines    Murmur, cardiac    FAINT NO CARDIOLOGIST   Other fatigue    Pneumonia 08/12/2019   COVID PNEUMONIA ALL SYMPTOMS RESOLVED PER PT   Shortness of breath on exertion    Past Surgical History:  Procedure Laterality Date   CESAREAN SECTION  12/13/2006   EXTRACORPOREAL SHOCK WAVE LITHOTRIPSY  2018   FOOT  SURGERY Right 10/02/2020   RIGHT FOOT HEEL AND TOES, SUR[facility]ICAL [facility] OFF [address]   LAPAROSCOPIC VAGINAL HYSTERECTOMY WITH SALPINGECTOMY Bilateral 11/17/2020   Procedure: LAPAROSCOPIC ASSISTED VAGINAL HYSTERECTOMY WITH BILATERAL SALPINGECTOMY;  Surgeon: Mitchel Honour, DO;  Location:  SURGERY CENTER;  Service: Gynecology;  Laterality: Bilateral;   Social History   Tobacco Use   Smoking status: Never    Passive exposure: Current   Smokeless tobacco: Never  Vaping Use   Vaping status: Never Used  Substance Use Topics   Alcohol use: No    Alcohol/week: 0.0 standard drinks of alcohol   Drug use: No   Family History  Problem Relation Age of Onset   Depression Mother    Anxiety disorder Mother    Sleep apnea Father    Anxiety disorder Father    Depression Father    Diabetes Father    Bladder Cancer Father 2   Rheum arthritis Father    Migraines Maternal Grandmother    Diabetes Maternal Grandfather    Healthy Daughter    Asthma Daughter    Anxiety disorder Daughter    Anxiety disorder Son    Asthma Son    Healthy Son    Migraines Maternal Aunt    Cerebral aneurysm Cousin    Heart  disease Other    Depression Other    Anxiety disorder Other    Sleep apnea Other    Colon cancer Neg Hx    Esophageal cancer Neg Hx    Stomach cancer Neg Hx    Rectal cancer Neg Hx    Allergies  Allergen Reactions   Amoxicillin Hives and Itching   Azithromycin     DOES NOT WORK   Dulaglutide Other (See Comments)    JANUVIA STOAMCH HURT?   Januvia [Sitagliptin] Other (See Comments)    HURTS STOAMCH   Latex     IRRITATES VAGINAL AREA   Penicillins Hives    Has patient had a PCN reaction causing immediate rash, facial/tongue/throat swelling, SOB or lightheadedness with hypotension: yes. Rash  Has patient had a PCN reaction causing severe rash involving mucus membranes or skin necrosis: Yes- rash and hives all over body  Has patient had a PCN reaction that required hospitalization  No Has patient had a PCN reaction occurring within the last 10 years: Yes  If all of the above answers are "NO", then may proceed with Cephalosporin use.       ROS Negative unless stated above    Objective:     BP 126/70 (BP Location: Left Arm, Patient Position: Sitting, Cuff Size: Large)   Pulse 85   Temp 98.5 F (36.9 C) (Oral)   Ht 5\' 6"  (1.676 m)   Wt 224 lb 6.4 oz (101.8 kg)   LMP 11/13/2020 (Exact Date)   SpO2 97%   BMI 36.22 kg/m    Physical Exam Constitutional:      General: She is not in acute distress.    Appearance: Normal appearance.  HENT:     Head: Normocephalic and atraumatic.     Jaw: Tenderness and swelling present.     Nose: Nose normal.     Mouth/Throat:     Mouth: Mucous membranes are moist.     Dentition: Abnormal dentition. Gingival swelling, dental caries and dental abscesses present.     Tongue: No lesions.     Pharynx: Oropharynx is clear.  Cardiovascular:     Rate and Rhythm: Normal rate and regular rhythm.     Heart sounds: Normal heart sounds. No murmur heard.    No gallop.  Pulmonary:     Effort: Pulmonary effort is normal. No respiratory distress.     Breath sounds: Normal breath sounds. No wheezing, rhonchi or rales.  Skin:    General: Skin is warm and dry.  Neurological:     Mental Status: She is alert and oriented to person, place, and time.      No results found for any visits on 12/02/22.    Assessment & Plan:  Dental abscess -     Cephalexin; Take 1 capsule (500 mg total) by mouth 2 (two) times daily for 7 days.  Dispense: 14 capsule; Refill: 0  Facial pain  Acute concern.  Start ABX, Keflex, for dental abscess.  Med allergies reviewed.  Penicillin, amoxicillin causes hives.  Azithromycin listed the reaction unknown.  Per chart review tolerated Keflex in the past.  Ibuprofen and Tylenol as needed for discomfort.  Encouraged to keep follow-up appointment with dentist.  Return if symptoms worsen or fail to improve.    Deeann Saint, MD

## 2022-12-06 ENCOUNTER — Other Ambulatory Visit: Payer: Self-pay

## 2022-12-07 ENCOUNTER — Other Ambulatory Visit: Payer: Self-pay

## 2022-12-16 ENCOUNTER — Telehealth: Payer: Self-pay | Admitting: Neurology

## 2022-12-16 ENCOUNTER — Other Ambulatory Visit: Payer: Self-pay

## 2022-12-16 MED ORDER — NORTRIPTYLINE HCL 10 MG PO CAPS: 30.0000 mg | ORAL_CAPSULE | Freq: Every day | ORAL | 0 refills | Status: DC

## 2022-12-16 MED ORDER — PROPRANOLOL HCL 60 MG PO TABS: 60.0000 mg | ORAL_TABLET | Freq: Two times a day (BID) | ORAL | 0 refills | Status: DC

## 2022-12-16 NOTE — Telephone Encounter (Signed)
Pt called and scheduled a f/u. Pt is needing a fill on her nortriptyline (PAMELOR) 10 MG capsule and her propranolol (INDERAL) 60 MG tablet and is wanting it sent to the [facility] on [address]

## 2022-12-16 NOTE — Telephone Encounter (Signed)
Requested refills sent.  

## 2022-12-20 ENCOUNTER — Other Ambulatory Visit: Payer: Self-pay

## 2022-12-21 ENCOUNTER — Ambulatory Visit: Payer: Self-pay | Admitting: Physician Assistant

## 2022-12-24 ENCOUNTER — Other Ambulatory Visit: Payer: Self-pay

## 2022-12-26 ENCOUNTER — Encounter (HOSPITAL_BASED_OUTPATIENT_CLINIC_OR_DEPARTMENT_OTHER): Payer: Self-pay

## 2022-12-26 ENCOUNTER — Emergency Department (HOSPITAL_BASED_OUTPATIENT_CLINIC_OR_DEPARTMENT_OTHER)
Admission: EM | Admit: 2022-12-26 | Discharge: 2022-12-26 | Disposition: A | Discharge status: Home / Self Care | Payer: BC Managed Care – PPO | Attending: Emergency Medicine | Admitting: Emergency Medicine

## 2022-12-26 ENCOUNTER — Other Ambulatory Visit: Payer: Self-pay

## 2022-12-26 DIAGNOSIS — Z91040 Latex allergy status: Secondary | ICD-10-CM | POA: Insufficient documentation

## 2022-12-26 DIAGNOSIS — K047 Periapical abscess without sinus: Secondary | ICD-10-CM | POA: Insufficient documentation

## 2022-12-26 DIAGNOSIS — Z794 Long term (current) use of insulin: Secondary | ICD-10-CM | POA: Diagnosis not present

## 2022-12-26 DIAGNOSIS — K0889 Other specified disorders of teeth and supporting structures: Secondary | ICD-10-CM | POA: Diagnosis present

## 2022-12-26 MED ORDER — IBUPROFEN 800 MG PO TABS: 800.0000 mg | ORAL_TABLET | Freq: Three times a day (TID) | ORAL | 0 refills | Status: DC

## 2022-12-26 MED ORDER — OXYCODONE-ACETAMINOPHEN 5-325 MG PO TABS: 2.0000 | ORAL_TABLET | ORAL | 0 refills | Status: DC | PRN

## 2022-12-26 MED ORDER — CEPHALEXIN 250 MG PO CAPS
1000.0000 mg | ORAL_CAPSULE | Freq: Once | ORAL | Status: AC
  Administered 2022-12-26: 1000 mg via ORAL
  Filled 2022-12-26: qty 4

## 2022-12-26 MED ORDER — OXYCODONE-ACETAMINOPHEN 5-325 MG PO TABS
2.0000 | ORAL_TABLET | Freq: Once | ORAL | Status: AC
  Administered 2022-12-26: 2 via ORAL
  Filled 2022-12-26: qty 2

## 2022-12-26 MED ORDER — OXYCODONE-ACETAMINOPHEN 5-325 MG PO TABS
2.0000 | ORAL_TABLET | ORAL | 0 refills | Status: DC | PRN
Start: 2022-12-26 — End: 2023-03-09

## 2022-12-26 MED ORDER — KETOROLAC TROMETHAMINE 60 MG/2ML IM SOLN
30.0000 mg | Freq: Once | INTRAMUSCULAR | Status: AC
  Administered 2022-12-26: 30 mg via INTRAMUSCULAR
  Filled 2022-12-26: qty 2

## 2022-12-26 MED ORDER — CEPHALEXIN 500 MG PO CAPS: 500.0000 mg | ORAL_CAPSULE | Freq: Four times a day (QID) | ORAL | 0 refills | Status: DC

## 2022-12-26 NOTE — ED Provider Notes (Signed)
[place] EMERGENCY DEPARTMENT AT [facility] Provider Note   CSN: [id number] Arrival date & time: 12/26/22  0054     History  Chief Complaint  Patient presents with   Dental Pain    Laurie Sutton is a 42 y.o. female.  42 year old female presents ER today secondary to dental pain.  Patient states she saw the dentist last week had a dental infection and got started on clindamycin and was told that she got worse to come to the ER.  She states that she has been on the antibiotics for about 5 days now has not not missed a dose but she is having a fever and she feels like the pain is getting worse.  Tylenol ibuprofen at home not working.  No trouble speaking, swallowing, breathing.  No significant swelling but she does have pain that radiates up towards her ear.   Dental Pain      Home Medications Prior to Admission medications   Medication Sig Start Date End Date Taking? Authorizing Provider  cephALEXin (KEFLEX) 500 MG capsule Take 1 capsule (500 mg total) by mouth 4 (four) times daily. 12/26/22  Yes Aubry Tucholski, Barbara Cower, MD  ibuprofen (ADVIL) 800 MG tablet Take 1 tablet (800 mg total) by mouth 3 (three) times daily. 12/26/22  Yes Mayco Walrond, Barbara Cower, MD  albuterol (VENTOLIN HFA) 108 (90 Base) MCG/ACT inhaler INHALE 2 PUFFS INTO THE LUNGS UP TO EVERY 6 HOURS AS NEEDED FOR WHEEZING/SHORTNESS OF BREATH 08/20/22   Nelwyn Salisbury, MD  clotrimazole-betamethasone (LOTRISONE) cream APPLY 1 APPLICATION TOPICALLY TWICE A DAY AS NEEDED 06/30/22   Nelwyn Salisbury, MD  Continuous Blood Gluc Sensor (FREESTYLE LIBRE 14 DAY SENSOR) MISC Apply topically every 14 (fourteen) days. 12/26/19   [provider]  Cranberry 125 MG TABS Take by mouth 2 (two) times daily.    [provider]  FLUoxetine (PROZAC) 40 MG capsule Take 1 capsule (40 mg total) by mouth 2 (two) times daily. 11/08/22   Nelwyn Salisbury, MD  gabapentin (NEURONTIN) 100 MG capsule Take 1 capsule (100 mg total) by mouth 2 (two)  times daily. 11/08/22   Nelwyn Salisbury, MD  HUMULIN R U-500 KWIKPEN 500 UNIT/ML KwikPen Use 120 units at breakfast, 100 at lunch, and 100 at dinner Patient taking differently: Use 60 units at breakfast, 70 at lunch, and 70 at dinner 04/08/22   Nelwyn Salisbury, MD  linaclotide [facility]) 145 MCG CAPS capsule Take 1 capsule (145 mcg total) by mouth daily before breakfast. 06/08/22 06/03/23  Sherrilyn Rist, MD  LORazepam (ATIVAN) 0.5 MG tablet Take 1 tablet (0.5 mg total) by mouth 2 (two) times daily as needed for anxiety. 11/08/22   Nelwyn Salisbury, MD  montelukast (SINGULAIR) 10 MG tablet Take 1 tablet (10 mg total) by mouth at bedtime. 08/25/22   Nelwyn Salisbury, MD  MOUNJARO 5 MG/0.5ML Pen Inject 5 mg into the skin once a week. 09/28/22     nortriptyline (PAMELOR) 10 MG capsule Take 3 capsules (30 mg total) by mouth at bedtime. 12/16/22   Glean Salvo, NP  omeprazole (PRILOSEC) 40 MG capsule TAKE 1 CAPSULE (40 MG TOTAL) BY MOUTH IN THE MORNING 11/04/22   Charlie Pitter III, MD  ondansetron (ZOFRAN-ODT) 4 MG disintegrating tablet TAKE 1 TABLET BY MOUTH EVERY 8 HOURS AS NEEDED FOR NAUSEA AND VOMITING 10/13/22   Irene Limbo, FNP  oxyCODONE-acetaminophen (PERCOCET) 5-325 MG tablet Take 2 tablets by mouth every 4 (four) hours as needed.  12/26/22   Tationna Fullard, Barbara Cower, MD  propranolol (INDERAL) 60 MG tablet Take 1 tablet (60 mg total) by mouth 2 (two) times daily. 12/16/22   Glean Salvo, NP  tirzepatide [facility]) 7.5 MG/0.5ML Pen Inject 7.5 mg into the skin once a week. 12/01/22   Irene Limbo, FNP      Allergies    Amoxicillin, Azithromycin, Dulaglutide, Januvia [sitagliptin], Latex, and Penicillins    Review of Systems   Review of Systems  Physical Exam Updated Vital Signs BP (!) 100/54   Pulse 66   Temp (!) 97.5 F (36.4 C)   Resp 16   LMP 11/13/2020 (Exact Date)   SpO2 96%  Physical Exam Vitals and nursing note reviewed.  Constitutional:      Appearance: She is well-developed.   HENT:     Head: Normocephalic and atraumatic.     Comments: Mild gingival edema around her right lower molar.  Appears to be her first molar but she has multiple missing teeth.  No abscess, sublingual edema or woodiness.  Neck is supple. Cardiovascular:     Rate and Rhythm: Normal rate and regular rhythm.  Pulmonary:     Effort: No respiratory distress.     Breath sounds: No stridor.  Abdominal:     General: There is no distension.  Musculoskeletal:     Cervical back: Normal range of motion.  Neurological:     Mental Status: She is alert.     ED Results / Procedures / Treatments   Labs (all labs ordered are listed, but only abnormal results are displayed) Labs Reviewed - No data to display  EKG None  Radiology No results found.  Procedures Procedures    Medications Ordered in ED Medications  oxyCODONE-acetaminophen (PERCOCET/ROXICET) 5-325 MG per tablet 2 tablet (2 tablets Oral Given 12/26/22 0125)  ketorolac (TORADOL) injection 30 mg (30 mg Intramuscular Given 12/26/22 0125)  cephALEXin (KEFLEX) capsule 1,000 mg (1,000 mg Oral Given 12/26/22 0209)    ED Course/ Medical Decision Making/ A&P                                 Medical Decision Making Risk Prescription drug management.   Patient with worsening pain related to her dental infection.  Will switch her antibiotics and give her some pain medicine here.  Encouraged her to continue scheduled ibuprofen at home and following up with dentistry.  No evidence of abscess requiring drainage, Ludwig's, Mnire's or any other deep neck space infection.  Described signs and symptoms requiring return to the ER.   Final Clinical Impression(s) / ED Diagnoses Final diagnoses:  Dental infection    Rx / DC Orders ED Discharge Orders          Ordered    oxyCODONE-acetaminophen (PERCOCET) 5-325 MG tablet  Every 4 hours PRN,   Status:  Discontinued        12/26/22 0206    ibuprofen (ADVIL) 800 MG tablet  3 times daily         12/26/22 0206    cephALEXin (KEFLEX) 500 MG capsule  4 times daily        12/26/22 0206    oxyCODONE-acetaminophen (PERCOCET) 5-325 MG tablet  Every 4 hours PRN        12/26/22 0207              Christepher Melchior, Barbara Cower, MD 12/26/22 [phone]

## 2022-12-26 NOTE — ED Triage Notes (Signed)
Pt c/o infection in tooth, seen for same at dentist last week. Pt states dentist put her on abx but was advised that if febrile, she needed to be evaluated. Temp noted to be 100.7 at home, states she has taken tylenol/ hydrocodone at home

## 2022-12-28 ENCOUNTER — Ambulatory Visit: Payer: BC Managed Care – PPO | Admitting: Nurse Practitioner

## 2022-12-28 ENCOUNTER — Encounter: Payer: Self-pay | Admitting: Family Medicine

## 2022-12-29 MED ORDER — LORAZEPAM 0.5 MG PO TABS: 0.5000 mg | ORAL_TABLET | Freq: Two times a day (BID) | ORAL | 5 refills | Status: DC | PRN

## 2022-12-29 NOTE — Telephone Encounter (Signed)
I refilled the Lorazepam

## 2022-12-30 ENCOUNTER — Other Ambulatory Visit: Payer: Self-pay

## 2022-12-30 MED ORDER — FREESTYLE LIBRE 3 SENSOR MISC
1.0000 | 11 refills | Status: DC
  Filled 2022-12-30: qty 2, 28d supply, fill #0
  Filled 2023-01-25: qty 2, 28d supply, fill #1
  Filled 2023-03-16 – 2023-03-18 (×2): qty 2, 28d supply, fill #2
  Filled 2023-04-20: qty 2, 28d supply, fill #3
  Filled 2023-05-23: qty 2, 28d supply, fill #4
  Filled 2023-06-15: qty 2, 28d supply, fill #5

## 2022-12-30 MED ORDER — FLUOXETINE HCL 40 MG PO CAPS: 40.0000 mg | ORAL_CAPSULE | Freq: Two times a day (BID) | ORAL | 5 refills | Status: DC

## 2022-12-30 NOTE — Telephone Encounter (Signed)
I refilled the Prozac

## 2023-01-03 ENCOUNTER — Other Ambulatory Visit: Payer: Self-pay

## 2023-01-04 ENCOUNTER — Other Ambulatory Visit: Payer: Self-pay

## 2023-01-07 ENCOUNTER — Encounter: Payer: Self-pay | Admitting: Nurse Practitioner

## 2023-01-10 ENCOUNTER — Other Ambulatory Visit: Payer: Self-pay | Admitting: Nurse Practitioner

## 2023-01-10 DIAGNOSIS — R110 Nausea: Secondary | ICD-10-CM

## 2023-01-10 MED ORDER — ONDANSETRON 4 MG PO TBDP: 4.0000 mg | ORAL_TABLET | Freq: Three times a day (TID) | ORAL | 0 refills | Status: DC | PRN

## 2023-01-13 ENCOUNTER — Ambulatory Visit: Payer: BC Managed Care – PPO | Admitting: Nurse Practitioner

## 2023-01-13 ENCOUNTER — Encounter: Payer: Self-pay | Admitting: Nurse Practitioner

## 2023-01-13 VITALS — BP 102/64 | HR 75 | Temp 98.1°F | Ht 66.0 in | Wt 217.0 lb

## 2023-01-13 DIAGNOSIS — E1169 Type 2 diabetes mellitus with other specified complication: Secondary | ICD-10-CM

## 2023-01-13 DIAGNOSIS — E669 Obesity, unspecified: Secondary | ICD-10-CM

## 2023-01-13 DIAGNOSIS — Z6835 Body mass index (BMI) 35.0-35.9, adult: Secondary | ICD-10-CM | POA: Diagnosis not present

## 2023-01-13 DIAGNOSIS — Z7985 Long-term (current) use of injectable non-insulin antidiabetic drugs: Secondary | ICD-10-CM

## 2023-01-13 DIAGNOSIS — Z794 Long term (current) use of insulin: Secondary | ICD-10-CM

## 2023-01-13 NOTE — Progress Notes (Signed)
Office: [phone]  /  Fax: [phone]  WEIGHT SUMMARY AND BIOMETRICS  Weight Lost Since Last Visit: 0lb  Weight Gained Since Last Visit: 0lb   Vitals Temp: 98.1 F (36.7 C) BP: 102/64 Pulse Rate: 75 SpO2: 98 %   Anthropometric Measurements Height: 5\' 6"  (1.676 m) Weight: 217 lb (98.4 kg) BMI (Calculated): 35.04 Weight at Last Visit: 217lb Weight Lost Since Last Visit: 0lb Weight Gained Since Last Visit: 0lb Starting Weight: 230lb Total Weight Loss (lbs): 13 lb (5.897 kg)   Body Composition  Body Fat %: 45.2 % Fat Mass (lbs): 98.4 lbs Muscle Mass (lbs): 113.2 lbs Total Body Water (lbs): 83.6 lbs Visceral Fat Rating : 11   Other Clinical Data Fasting: No Labs: No Today's Visit #: 22 Starting Date: 06/09/21     HPI  Chief Complaint: OBESITY  Laurie Sutton is here to discuss her progress with her obesity treatment plan. She is on the keeping a food journal and adhering to recommended goals of 1800 calories and 100+ protein and states she is following her eating plan approximately 40 % of the time. She states she is exercising 0 minutes 0 days per week.   Interval History:  Since last office visit she has maintained her weight.  She is trying to eat more protein.  She is eating lunch and dinner.  Sometimes skips breakfast if she is nauseated or due to work. She is eating more meals and less "junk food".  She is struggling with feet pain and struggles with exercising due to pain.   Pharmacotherapy for weight loss: She is not currently taking medications  for medical weight loss.   Previous pharmacotherapy for medical weight loss:  Phentermine  Bariatric surgery:   Patient has not had bariatric surgery. Was interested but was not consider a candidate. If she decides she would like to proceed with surgery, will send to [facility] for further evaluation.   Pharmacotherapy for DMT2:  She is currently taking Humulin breakfast dose is 80 units, lunch 80 units and dinner  is 80 units. She is taking Mounjaro 7.5 mg (took 2nd dose today) prescribed by endo after her last visit Last A1c was 11.1 on June 18th CBGs: average 190-260 Episodes of hypoglycemia: 60 x 1, 70s x 2 since increasing Mounjaro to 7.5mg  She is not on an ACE or ARB, ASA 81mg  and statin.  Last eye exam:  Sept 24th, 2024 She has tried: Metformin-stopped due to side effects of body aches,  Glipizide-nausea januvia-stopped due to abd pain/cramping,  Ozempic-nausea,  Trulicity-nausea  She saw endo on August 2nd.  Has follow up appt on Nov 6th.    PHYSICAL EXAM:  Blood pressure 102/64, pulse 75, temperature 98.1 F (36.7 C), height 5\' 6"  (1.676 m), weight 217 lb (98.4 kg), last menstrual period 11/13/2020, SpO2 98%. Body mass index is 35.02 kg/m.  General: She is overweight, cooperative, alert, well developed, and in no acute distress. PSYCH: Has normal mood, affect and thought process.   Extremities: No edema.  Neurologic: No gross sensory or motor deficits. No tremors or fasciculations noted.    DIAGNOSTIC DATA REVIEWED:  BMET    Component Value Date/Time   NA 135 03/23/2022 0919   NA 138 08/19/2017 0000   K 3.4 (L) 03/23/2022 0919   CL 101 03/23/2022 0919   CO2 25 03/23/2022 0919   GLUCOSE 191 (H) 03/23/2022 0919   BUN 7 03/23/2022 0919   BUN 8 08/19/2017 0000   CREATININE 0.46 03/23/2022 0919  CREATININE 0.42 (L) 10/29/2021 0848   CALCIUM 8.9 03/23/2022 0919   GFRNONAA >60 06/08/2021 0910   GFRAA >60 08/09/2019 0459   Lab Results  Component Value Date   HGBA1C 11.1 09/28/2022   HGBA1C 12.0 (H) 11/12/2013   No results found for: "INSULIN" Lab Results  Component Value Date   TSH 1.170 06/09/2021   CBC    Component Value Date/Time   WBC 4.0 03/23/2022 0919   RBC 4.69 03/23/2022 0919   HGB 13.3 03/23/2022 0919   HCT 39.5 03/23/2022 0919   PLT 91.0 (L) 03/23/2022 0919   MCV 84.3 03/23/2022 0919   MCH 28.6 10/29/2021 0848   MCHC 33.6 03/23/2022 0919   RDW 16.2  (H) 03/23/2022 0919   Iron Studies No results found for: "IRON", "TIBC", "FERRITIN", "IRONPCTSAT" Lipid Panel     Component Value Date/Time   CHOL 169 08/19/2017 0000   TRIG 202 (A) 08/19/2017 0000   HDL 38 08/19/2017 0000   CHOLHDL 4 05/12/2015 1042   VLDL 37.8 05/12/2015 1042   LDLCALC 91 08/19/2017 0000   Hepatic Function Panel     Component Value Date/Time   PROT 7.1 03/23/2022 0919   ALBUMIN 3.8 03/23/2022 0919   AST 41 (H) 03/23/2022 0919   ALT 32 03/23/2022 0919   ALKPHOS 100 03/23/2022 0919   BILITOT 1.3 (H) 03/23/2022 0919   BILIDIR 0.2 04/27/2019 1223      Component Value Date/Time   TSH 1.170 06/09/2021 0915   Nutritional Lab Results  Component Value Date   VD25OH 34 10/29/2021   VD25OH 12.8 (L) 06/09/2021     ASSESSMENT AND PLAN  TREATMENT PLAN FOR OBESITY:  Recommended Dietary Goals  Eldena is currently in the action stage of change. As such, her goal is to continue weight management plan. She has agreed to keeping a food journal and adhering to recommended goals of 1800 calories and 100 protein.  Behavioral Intervention  We discussed the following Behavioral Modification Strategies today: increasing lean protein intake, decreasing simple carbohydrates , increasing vegetables, increasing lower glycemic fruits, increasing water intake, work on meal planning and preparation, reading food labels , keeping healthy foods at home, continue to practice mindfulness when eating, and planning for success.  Additional resources provided today: NA  Recommended Physical Activity Goals  Kyleen has been advised to work up to 150 minutes of moderate intensity aerobic activity a week and strengthening exercises 2-3 times per week for cardiovascular health, weight loss maintenance and preservation of muscle mass.   She has agreed to Think about ways to increase daily physical activity and overcoming barriers to exercise, Increase physical activity in their day and  reduce sedentary time (increase NEAT)., and Work on scheduling and tracking physical activity.    ASSOCIATED CONDITIONS ADDRESSED TODAY  Action/Plan  Type 2 diabetes mellitus with other specified complication, with long-term current use of insulin (HCC) To reach out to Endo to discuss meds and BS-3 lows since increasing Mounjaro dose.     Good blood sugar control is important to decrease the likelihood of diabetic complications such as nephropathy, neuropathy, limb loss, blindness, coronary artery disease, and death. Intensive lifestyle modification including diet, exercise and weight loss are the first line of treatment for diabetes.    Generalized obesity: Starting BMI 36  BMI 35.0-35.9,adult         Return in about 4 weeks (around 02/10/2023).Marland Kitchen She was informed of the importance of frequent follow up visits to maximize her success with intensive lifestyle  modifications for her multiple health conditions.   ATTESTASTION STATEMENTS:  Reviewed by clinician on day of visit: allergies, medications, problem list, medical history, surgical history, family history, social history, and previous encounter notes.   Time spent on visit including pre-visit chart review and post-visit care and charting was 30 minutes.    Theodis Sato. Lashai Grosch FNP-C

## 2023-01-17 ENCOUNTER — Other Ambulatory Visit: Payer: Self-pay | Admitting: Gastroenterology

## 2023-01-17 DIAGNOSIS — K5909 Other constipation: Secondary | ICD-10-CM

## 2023-01-17 NOTE — Telephone Encounter (Signed)
This prescription for Linzess can be refilled at the current dose for 90-day supply.  However, this patient has cirrhosis in addition to constipation and needs clinic follow-up with me for both conditions.  Please help coordinate a next available follow-up visit with me.  H Danis

## 2023-01-18 NOTE — Telephone Encounter (Signed)
Left message for pt to call back  °

## 2023-01-19 ENCOUNTER — Other Ambulatory Visit: Payer: Self-pay | Admitting: Family Medicine

## 2023-01-19 NOTE — Telephone Encounter (Signed)
Pt made aware of refill request and Dr. Myrtie Neither recommendations: Pt was scheduled for an office visit on 04/20/2023 at 2:00 PM.  Pt made aware.  Prescription sent to pharmacy. Pt made aware.  Pt verbalized understanding with all questions answered.

## 2023-01-21 ENCOUNTER — Telehealth: Payer: Self-pay | Admitting: Family Medicine

## 2023-01-21 ENCOUNTER — Other Ambulatory Visit: Payer: Self-pay | Admitting: Family Medicine

## 2023-01-21 NOTE — Telephone Encounter (Signed)
Pt called to ask the status of this refill request?

## 2023-01-21 NOTE — Telephone Encounter (Signed)
Rx sent on 12/30/22 by pt Endocrinologist. Pt notified

## 2023-01-21 NOTE — Telephone Encounter (Signed)
Prescription Request  01/21/2023  LOV: 11/08/2022  What is the name of the medication or equipment? Continuous Glucose Sensor (FREESTYLE LIBRE 3 SENSOR) MISC   Have you contacted your pharmacy to request a refill? Yes   Which pharmacy would you like this sent to?   [id number] [place], [place] -  [address] [place] [address] [place] [address] Phone: [phone] Fax: [phone]    Patient notified that their request is being sent to the clinical staff for review and that they should receive a response within 2 business days.   Please advise at Mobile [phone] (mobile)

## 2023-01-25 ENCOUNTER — Other Ambulatory Visit (HOSPITAL_COMMUNITY): Payer: Self-pay

## 2023-01-25 ENCOUNTER — Other Ambulatory Visit: Payer: Self-pay

## 2023-01-26 ENCOUNTER — Other Ambulatory Visit: Payer: Self-pay

## 2023-01-26 ENCOUNTER — Telehealth: Payer: Self-pay | Admitting: Hematology and Oncology

## 2023-01-26 NOTE — Telephone Encounter (Signed)
Spoke with patient confirming upcoming appointment  

## 2023-02-02 ENCOUNTER — Other Ambulatory Visit: Payer: Self-pay

## 2023-02-02 MED ORDER — TIRZEPATIDE 7.5 MG/0.5ML ~~LOC~~ SOAJ
7.5000 mg | SUBCUTANEOUS | 3 refills | Status: DC
Start: 2023-02-02 — End: 2023-03-09
  Filled 2023-02-02 – 2023-02-04 (×3): qty 2, 28d supply, fill #0

## 2023-02-03 ENCOUNTER — Other Ambulatory Visit: Payer: Self-pay

## 2023-02-04 ENCOUNTER — Other Ambulatory Visit: Payer: Self-pay

## 2023-02-09 ENCOUNTER — Encounter: Payer: Self-pay | Admitting: Nurse Practitioner

## 2023-02-09 ENCOUNTER — Ambulatory Visit: Payer: BC Managed Care – PPO | Admitting: Nurse Practitioner

## 2023-02-09 VITALS — BP 98/63 | HR 73 | Temp 98.1°F | Ht 66.0 in | Wt 215.0 lb

## 2023-02-09 DIAGNOSIS — Z794 Long term (current) use of insulin: Secondary | ICD-10-CM

## 2023-02-09 DIAGNOSIS — E1169 Type 2 diabetes mellitus with other specified complication: Secondary | ICD-10-CM

## 2023-02-09 DIAGNOSIS — E669 Obesity, unspecified: Secondary | ICD-10-CM | POA: Diagnosis not present

## 2023-02-09 DIAGNOSIS — R110 Nausea: Secondary | ICD-10-CM

## 2023-02-09 DIAGNOSIS — Z6834 Body mass index (BMI) 34.0-34.9, adult: Secondary | ICD-10-CM | POA: Diagnosis not present

## 2023-02-09 MED ORDER — ONDANSETRON 4 MG PO TBDP: 4.0000 mg | ORAL_TABLET | Freq: Three times a day (TID) | ORAL | 0 refills | Status: DC | PRN

## 2023-02-09 NOTE — Progress Notes (Signed)
Office: [phone]  /  Fax: [phone]  WEIGHT SUMMARY AND BIOMETRICS  Weight Lost Since Last Visit: 2lb  Weight Gained Since Last Visit: 0lb   Vitals Temp: 98.1 F (36.7 C) BP: 98/63 Pulse Rate: 73 SpO2: 96 %   Anthropometric Measurements Height: 5\' 6"  (1.676 m) Weight: 215 lb (97.5 kg) BMI (Calculated): 34.72 Weight at Last Visit: 217lb Weight Lost Since Last Visit: 2lb Weight Gained Since Last Visit: 0lb Starting Weight: 230lb Total Weight Loss (lbs): 15 lb (6.804 kg)   Body Composition  Body Fat %: 45 % Fat Mass (lbs): 97 lbs Muscle Mass (lbs): 112.6 lbs Total Body Water (lbs): 81.6 lbs Visceral Fat Rating : 11   Other Clinical Data Fasting: No Labs: no Today's Visit #: 23 Starting Date: 06/09/21     HPI  Chief Complaint: OBESITY  Laurie Sutton is here to discuss her progress with her obesity treatment plan. She is on the keeping a food journal and adhering to recommended goals of 1800 calories and 100+ protein and states she is following her eating plan approximately 40 % of the time. She states she is exercising 30 minutes 5 days per week.   Interval History:  Since last office visit she has lost 2 pounds.   She celebrated her birthday since her last visit.  She is trying to eat more protein.    She saw GYN a couple weeks ago.  Has been referred to oncology for low WBC, low iron and low platelets.  She saw oncology last on 01/06/21 and has an appt scheduled on 02/11/23  Pharmacotherapy for weight loss: She is not currently taking medications  for medical weight loss.    Previous pharmacotherapy for medical weight loss:  Phentermine   Bariatric surgery:   Patient has not had bariatric surgery. Was interested but was not consider a candidate. If she decides she would like to proceed with surgery, will send to [facility] for further evaluation.    Pharmacotherapy for DMT2:  She is currently taking Humulin breakfast dose is 80 units, lunch 80 units and dinner  is 80 units. She is taking Mounjaro 7.5 mg (prescribed by endo).  Has a follow up appt Nov 6th. Takes Zofran PRN for nausea.  Requesting a refill.   Last A1c was 11.1 on June 18th CBGs: average 200s Episodes of hypoglycemia: 60 x 1 She is not on an ACE or ARB, ASA 81mg  or statin.  Last eye exam:  Sept 24th, 2024 She has tried: Metformin-stopped due to side effects of body aches,  Glipizide-nausea januvia-stopped due to abd pain/cramping,  Ozempic-nausea,  Trulicity-nausea  She saw endo last on August 2nd.  Has follow up appt on Nov 6th.      PHYSICAL EXAM:  Blood pressure 98/63, pulse 73, temperature 98.1 F (36.7 C), height 5\' 6"  (1.676 m), weight 215 lb (97.5 kg), last menstrual period 11/13/2020, SpO2 96%. Body mass index is 34.7 kg/m.  General: She is overweight, cooperative, alert, well developed, and in no acute distress. PSYCH: Has normal mood, affect and thought process.   Extremities: No edema.  Neurologic: No gross sensory or motor deficits. No tremors or fasciculations noted.    DIAGNOSTIC DATA REVIEWED:  BMET    Component Value Date/Time   NA 135 03/23/2022 0919   NA 138 08/19/2017 0000   K 3.4 (L) 03/23/2022 0919   CL 101 03/23/2022 0919   CO2 25 03/23/2022 0919   GLUCOSE 191 (H) 03/23/2022 0919   BUN 7 03/23/2022  0919   BUN 8 08/19/2017 0000   CREATININE 0.46 03/23/2022 0919   CREATININE 0.42 (L) 10/29/2021 0848   CALCIUM 8.9 03/23/2022 0919   GFRNONAA >60 06/08/2021 0910   GFRAA >60 08/09/2019 0459   Lab Results  Component Value Date   HGBA1C 11.1 09/28/2022   HGBA1C 12.0 (H) 11/12/2013   No results found for: "INSULIN" Lab Results  Component Value Date   TSH 1.170 06/09/2021   CBC    Component Value Date/Time   WBC 4.0 03/23/2022 0919   RBC 4.69 03/23/2022 0919   HGB 13.3 03/23/2022 0919   HCT 39.5 03/23/2022 0919   PLT 91.0 (L) 03/23/2022 0919   MCV 84.3 03/23/2022 0919   MCH 28.6 10/29/2021 0848   MCHC 33.6 03/23/2022 0919   RDW  16.2 (H) 03/23/2022 0919   Iron Studies No results found for: "IRON", "TIBC", "FERRITIN", "IRONPCTSAT" Lipid Panel     Component Value Date/Time   CHOL 169 08/19/2017 0000   TRIG 202 (A) 08/19/2017 0000   HDL 38 08/19/2017 0000   CHOLHDL 4 05/12/2015 1042   VLDL 37.8 05/12/2015 1042   LDLCALC 91 08/19/2017 0000   Hepatic Function Panel     Component Value Date/Time   PROT 7.1 03/23/2022 0919   ALBUMIN 3.8 03/23/2022 0919   AST 41 (H) 03/23/2022 0919   ALT 32 03/23/2022 0919   ALKPHOS 100 03/23/2022 0919   BILITOT 1.3 (H) 03/23/2022 0919   BILIDIR 0.2 04/27/2019 1223      Component Value Date/Time   TSH 1.170 06/09/2021 0915   Nutritional Lab Results  Component Value Date   VD25OH 34 10/29/2021   VD25OH 12.8 (L) 06/09/2021     ASSESSMENT AND PLAN  TREATMENT PLAN FOR OBESITY:  Recommended Dietary Goals  Laurie Sutton is currently in the action stage of change. As such, her goal is to continue weight management plan. She has agreed to keeping a food journal and adhering to recommended goals of 1800 calories and 100+ protein.  Behavioral Intervention  We discussed the following Behavioral Modification Strategies today: increasing lean protein intake to established goals, increasing water intake , and continue to work on maintaining a reduced calorie state, getting the recommended amount of protein, incorporating whole foods, making healthy choices, staying well hydrated and practicing mindfulness when eating..  Additional resources provided today: NA  Recommended Physical Activity Goals  [name] has been advised to work up to 150 minutes of moderate intensity aerobic activity a week and strengthening exercises 2-3 times per week for cardiovascular health, weight loss maintenance and preservation of muscle mass.   She has agreed to Continue current level of physical activity    ASSOCIATED CONDITIONS ADDRESSED TODAY  Action/Plan  Type 2 diabetes mellitus with other  specified complication, with long-term current use of insulin (HCC) Keep follow up appt with Endo.  Take meds as directed.   Nausea -     Ondansetron; Take 1 tablet (4 mg total) by mouth every 8 (eight) hours as needed for nausea or vomiting.  Dispense: 18 tablet; Refill: 0.  Side effects discussed  Generalized obesity: Starting BMI 36  BMI 34.0-34.9,adult         Return in about 4 weeks (around 03/09/2023).Marland Kitchen She was informed of the importance of frequent follow up visits to maximize her success with intensive lifestyle modifications for her multiple health conditions.   ATTESTASTION STATEMENTS:  Reviewed by clinician on day of visit: allergies, medications, problem list, medical history, surgical history, family history,  social history, and previous encounter notes.   Time spent on visit including pre-visit chart review and post-visit care and charting was 30 minutes.    Theodis Sato. Jamiracle Avants FNP-C

## 2023-02-11 ENCOUNTER — Inpatient Hospital Stay: Payer: BC Managed Care – PPO | Admitting: Hematology and Oncology

## 2023-02-15 ENCOUNTER — Other Ambulatory Visit: Payer: Self-pay

## 2023-02-16 ENCOUNTER — Other Ambulatory Visit: Payer: Self-pay

## 2023-02-16 MED ORDER — MOUNJARO 10 MG/0.5ML ~~LOC~~ SOAJ
10.0000 mg | SUBCUTANEOUS | 3 refills | Status: DC
  Filled 2023-02-16 – 2023-03-18 (×3): qty 2, 28d supply, fill #0
  Filled 2023-04-20: qty 2, 28d supply, fill #1
  Filled 2023-05-23: qty 2, 28d supply, fill #2
  Filled 2023-06-15: qty 2, 28d supply, fill #3

## 2023-02-18 ENCOUNTER — Other Ambulatory Visit: Payer: Self-pay

## 2023-02-21 ENCOUNTER — Ambulatory Visit (INDEPENDENT_AMBULATORY_CARE_PROVIDER_SITE_OTHER): Payer: BC Managed Care – PPO | Admitting: Podiatry

## 2023-02-21 ENCOUNTER — Encounter: Payer: Self-pay | Admitting: Podiatry

## 2023-02-21 ENCOUNTER — Ambulatory Visit (INDEPENDENT_AMBULATORY_CARE_PROVIDER_SITE_OTHER): Payer: BC Managed Care – PPO

## 2023-02-21 VITALS — Ht 66.0 in | Wt 215.0 lb

## 2023-02-21 DIAGNOSIS — M722 Plantar fascial fibromatosis: Secondary | ICD-10-CM

## 2023-02-21 MED ORDER — MELOXICAM 15 MG PO TABS: 15.0000 mg | ORAL_TABLET | Freq: Every day | ORAL | 1 refills | Status: DC

## 2023-02-21 MED ORDER — BETAMETHASONE SOD PHOS & ACET 6 (3-3) MG/ML IJ SUSP
3.0000 mg | Freq: Once | INTRAMUSCULAR | Status: AC
  Administered 2023-02-21: 3 mg via INTRA_ARTICULAR

## 2023-02-21 MED ORDER — METHYLPREDNISOLONE 4 MG PO TBPK: ORAL_TABLET | ORAL | 0 refills | Status: DC

## 2023-02-21 NOTE — Progress Notes (Signed)
   Chief Complaint  Patient presents with   Foot Pain    Pt is here for left foot pain for about 6 months states it feel like her right foot did before and needed surgery for it.    Subjective: 42 y.o. female PMHx T2DM, last A1c 09/28/2022 was 11.1, presenting today for evaluation of left heel pain.  Patient has a long history of plantar fasciitis bilateral.  She ultimately had endoscopic plantar fasciotomy to the right foot a few years prior.  She says the right foot is doing very well.  She wears her orthotics but she continues to have pain and tenderness to her feet.   Past Medical History:  Diagnosis Date   ADHD    Anemia    IRON TRANSFUSION 07-2020 NONE SINCE   Anxiety    Back pain    COVID 08/12/2019   ALL SYMPTOMS REOLVED PER PT   Depression    Diabetic neuropathy (HCC) 11/11/2020   FEET   dm type 2    sees Dr. Ocie Cornfield at [facility]   Fatty liver    GERD (gastroesophageal reflux disease)    History of kidney stones    Joint pain    Menorrhagia 11/11/2020   Migraines    Murmur, cardiac    FAINT NO CARDIOLOGIST   Other fatigue    Pneumonia 08/12/2019   COVID PNEUMONIA ALL SYMPTOMS RESOLVED PER PT   Shortness of breath on exertion      Objective: Physical Exam General: The patient is alert and oriented x3 in no acute distress.  Dermatology: Skin is warm, dry and supple bilateral lower extremities. Negative for open lesions or macerations bilateral.   Vascular: Dorsalis Pedis and Posterior Tibial pulses palpable bilateral.  Capillary fill time is immediate to all digits.  Neurological: Grossly intact via light touch  Musculoskeletal: Tenderness to palpation to the plantar aspect of the left heel along the plantar fascia. All other joints range of motion within normal limits bilateral. Strength 5/5 in all groups bilateral.   Radiographic exam LT foot 02/21/2023: Normal osseous mineralization. Joint spaces preserved. No fracture/dislocation/boney  destruction. No other soft tissue abnormalities or radiopaque foreign bodies.  Plantar and posterior heel spurs noted on lateral view  Assessment: 1. Plantar fasciitis left foot 2. H/o endoscopic plantar fasciotomy right with partial permanent nail avulsion lateral border right great toe. DOS: 10/02/2020   Plan of Care:  -Patient evaluated. Xrays reviewed.   -Injection of 0.5cc Celestone soluspan injected into the left plantar fascia.  -Rx for Medrol Dose Pak placed -Rx for Meloxicam 15 mg ordered for patient. -Continue custom molded orthotics -Instructed patient regarding therapies and modalities at home to alleviate symptoms.  -Return to clinic as needed  *Works in [employer] at [employer]. Daughter, Ava, in 7th grade at [employer].   Felecia Shelling, DPM [facility]  Dr. Felecia Shelling, DPM    [address] [place], [place] [address]                Office [phone]  Fax [phone]

## 2023-02-22 ENCOUNTER — Telehealth: Payer: Self-pay | Admitting: Gastroenterology

## 2023-02-22 ENCOUNTER — Other Ambulatory Visit: Payer: Self-pay

## 2023-02-22 NOTE — Telephone Encounter (Signed)
Detailed message left on pts voicemail. Pt scheduled to see Dr. Myrtie Neither 04/14/23 at 10:20am. Pt also sent mychart message.

## 2023-02-22 NOTE — Telephone Encounter (Signed)
This is a former patient of Dr. Christella Hartigan whose care I took over late last year.  She has chronic constipation on Linzess and NASH related cirrhosis.  Last office visit here 05/03/2022.  I received some lab work from her primary care provider:  02/17/2023 albumin 3.4, alkaline phosphatase 126, AST 58, ALT 43, total bilirubin 1.4, direct bilirubin 0.4  There are no additional lab test results, but some notes from her provider below the LFT results suggest her hemoglobin A1c is improving, previous AST had been 75, and platelets have dropped from 101 to 75   This patient needs in person clinic follow-up with me in the next 6 to 8 weeks.  Please look ahead to a 7-day hold slot that can be released for her so we can discuss her cirrhosis and these lab results and probable further labs and imaging that is needed.  Ellwood Dense MD

## 2023-02-22 NOTE — Progress Notes (Addendum)
 PATIENT: Laurie Sutton DOB: Oct 09, 1980  REASON FOR VISIT: follow up for migraine HISTORY FROM: patient PRIMARY NEUROLOGIST: Dr. Lucia Gaskins  ASSESSMENT AND PLAN 42 y.o. year old female with:  1.  Chronic migraine headache  -Right now, patient wishes to continue current medications for migraine prevention:  -Continue propranolol 60 mg twice daily  -Continue nortriptyline 10 mg, 3 tablets at bedtime  -Can continue Excedrin Migraine as needed for acute migraine, takes no more than 3-4 times a month -Next steps: CGRP Ajovy, Nurtec, Ubrelvy -Previously tried and failed: Maxalt, gabapentin, fluoxetine, meloxicam, propranolol, nortriptyline -Encouraged to reach out via MyChart if she would like to make any adjustments, otherwise I will see her back in 1 year.    Meds ordered this encounter  Medications   propranolol (INDERAL) 60 MG tablet    Sig: Take 1 tablet (60 mg total) by mouth 2 (two) times daily.    Dispense:  180 tablet    Refill:  3   nortriptyline (PAMELOR) 10 MG capsule    Sig: Take 3 capsules (30 mg total) by mouth at bedtime.    Dispense:  270 capsule    Refill:  3   Addendum 07/06/2023 SS follows with [facility] decompensated cirrhosis secondary to MASH complicated by portal hypertension with esophageal varices, portal hypertensive gastropathy and radiologic ascites good for transplant referral, work on better medical management of symptoms.  Okay to take Tylenol up to 2000 mg a day for pain.  Oral NSAIDs are contraindicated in cirrhosis.  HISTORY OF PRESENT ILLNESS: Today 02/23/23  Last visit we increased her propranolol to 60 mg BID (reports GI is also using for esophageal varices), also on nortriptyline 30 mg at bedtime for migraine prevention. Due to see GI again for NASH. Right now 3-4 severe migraines a month, takes Excedrin migraine with good benefit. If she forgets her preventative, has worse headaches. Of 30 days, has 13 days a month of mild headaches.  Usually finds ice pack, laying down best for headaches. Triggered by stress, needs glasses often strains eyes, blood sugars high. Recent A1C was 9. On Mounjaro. Already on a lot of medication, really doesn't want anymore. 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   09/30/21 SS: Laurie Sutton is here today for follow-up. On nortriptyline and propranolol for migraine prevention.  During weather change, has frequent headache that is right-sided, retro-orbital.  Has photophobia, phonophobia, better with lying down.  She also has migraines, that can be frontal, well managed with current preventative medicine, 1/month.  Takes Excedrin Migraine with good benefit. Found to have early stages of cirrhosis, monitoring. Going to weight loss clinic.  Is on insulin.  Seeing ophthalmology today for routine visit.  Update 09/30/20 SS: Laurie Sutton is a 42 year old female with history of migraine headaches.  She is on nortriptyline and propanolol.  Takes Maxalt. Reports 2-3 migraines a week. Reports Maxalt makes her stomach hurt. Will take Excedrin migraine, doesn't take often, works well. For a few days she missed propranolol, headaches much worse. Having hysterectomy in August. Right foot surgery this week for plantar fascitis. 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 Here today unaccompanied. Started nutrition and diet program, exercise, lost 19 lbs.   Update 07/11/2019 SS: Laurie Sutton is a 42 year old female with history of migraine headaches.  She remains on nortriptyline and propanolol.  She takes Maxalt if needed.  Her headaches have increased as of lately due to poorly controlled diabetes, stress, and weather change.  She says she is having 2-3 migraines a month, 3-4 headaches a month, overall about 7-8 headache days a month.  About a month ago, says she had a headache that was behind her right eye, went to the eye doctor, was told everything was normal, was already on migraine preventative  medications.  She does not really take Maxalt, will usually take Excedrin, maybe 2-3 times a month.  Her recent A1c was 9.  She is planning to have an endoscopy for GI issues.  She has not been wearing her glasses as much, due to fogging from the mask, thinks this may be contributing to her headaches.  She works part-time in a Futures trader.  She presents today for evaluation unaccompanied.  HISTORY HISTORY OF PRESENT ILLNESS:UPDATE 1/15/2020CM Laurie Sutton, 42 year old female returns for follow-up with history of migraine headaches.  She reports she has had 2 migraines in the last 6 months.  She woke up with a mild headache this morning however when checking her fasting blood sugar it was 170.  She has had some adjustments to her insulin dose.  Weather changes are a migraine trigger for her.  She really does not have any food triggers.  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  She has not missed any work.  She returns for reevaluation   REVIEW OF SYSTEMS: Out of a complete 14 system review of symptoms, the patient complains only of the following symptoms, and all other reviewed systems are negative.  See HPI  ALLERGIES: Allergies  Allergen Reactions   Amoxicillin Hives and Itching   Azithromycin     DOES NOT WORK   Dulaglutide Other (See Comments)    JANUVIA STOAMCH HURT?  Other Reaction(s): constipation and stomach issues    JANUVIA STOAMCH HURT?   Empagliflozin-Metformin Hcl Er Other (See Comments) and Swelling   Januvia [Sitagliptin] Other (See Comments)    HURTS STOAMCH   Latex     IRRITATES VAGINAL AREA   Metformin     Other Reaction(s): achy all over   Penicillins Hives    Has patient had a PCN reaction causing immediate rash, facial/tongue/throat swelling, SOB or lightheadedness with hypotension: yes. Rash  Has patient had a PCN reaction causing severe rash involving mucus membranes or skin necrosis: Yes- rash and hives all over body  Has patient had a PCN reaction that  required hospitalization No Has patient had a PCN reaction occurring within the last 10 years: Yes  If all of the above answers are "NO", then may proceed with Cephalosporin use.     HOME MEDICATIONS: Outpatient Medications Prior to Visit  Medication Sig Dispense Refill   albuterol (VENTOLIN HFA) 108 (90 Base) MCG/ACT inhaler INHALE 2 PUFFS INTO THE LUNGS UP TO EVERY 6 HOURS AS NEEDED FOR WHEEZING/SHORTNESS OF BREATH 18 each 0   clotrimazole-betamethasone (LOTRISONE) cream APPLY 1 APPLICATION TOPICALLY TWICE A DAY AS NEEDED 30 g 1   Continuous Blood Gluc Sensor (FREESTYLE LIBRE 14 DAY SENSOR) MISC Apply topically every 14 (fourteen) days.     Continuous Glucose Sensor (FREESTYLE LIBRE 3 SENSOR) MISC Apply sensor to upper back of arm, change sensor every 14 days. 2 each 11   Cranberry 125 MG TABS Take by mouth 2 (two) times daily.     ergocalciferol (VITAMIN D2) 1.25 MG (50000 UT) capsule Take 50,000 Units by mouth once a week.     FLUoxetine (PROZAC) 40 MG capsule TAKE 1 CAPSULE BY MOUTH TWICE A DAY 180 capsule 0  gabapentin (NEURONTIN) 100 MG capsule Take 1 capsule (100 mg total) by mouth 2 (two) times daily.     HUMULIN R U-500 KWIKPEN 500 UNIT/ML KwikPen Use 120 units at breakfast, 100 at lunch, and 100 at dinner (Patient taking differently: Use 60 units at breakfast, 70 at lunch, and 70 at dinner) 1 mL 0   LINZESS 145 MCG CAPS capsule TAKE 1 CAPSULE BY MOUTH DAILY BEFORE BREAKFAST. 90 capsule 0   LORazepam (ATIVAN) 0.5 MG tablet Take 1 tablet (0.5 mg total) by mouth 2 (two) times daily as needed for anxiety. 60 tablet 5   meloxicam (MOBIC) 15 MG tablet Take 1 tablet (15 mg total) by mouth daily. 60 tablet 1   methylPREDNISolone (MEDROL DOSEPAK) 4 MG TBPK tablet 6 day dose pack - take as directed 21 tablet 0   montelukast (SINGULAIR) 10 MG tablet Take 1 tablet (10 mg total) by mouth at bedtime. 90 tablet 3   nortriptyline (PAMELOR) 10 MG capsule Take 3 capsules (30 mg total) by mouth at  bedtime. 270 capsule 0   omeprazole (PRILOSEC) 40 MG capsule TAKE 1 CAPSULE (40 MG TOTAL) BY MOUTH IN THE MORNING 90 capsule 1   ondansetron (ZOFRAN-ODT) 4 MG disintegrating tablet Take 1 tablet (4 mg total) by mouth every 8 (eight) hours as needed for nausea or vomiting. 18 tablet 0   oxyCODONE-acetaminophen (PERCOCET) 5-325 MG tablet Take 2 tablets by mouth every 4 (four) hours as needed. 15 tablet 0   propranolol (INDERAL) 60 MG tablet Take 1 tablet (60 mg total) by mouth 2 (two) times daily. 180 tablet 0   tirzepatide (MOUNJARO) 10 MG/0.5ML Pen Inject 10 mg into the skin once a week. 2 mL 3   tirzepatide (MOUNJARO) 7.5 MG/0.5ML Pen Inject 7.5 mg into the skin once a week. 2 mL 3   ibuprofen (ADVIL) 800 MG tablet Take 1 tablet (800 mg total) by mouth 3 (three) times daily. 21 tablet 0   MOUNJARO 5 MG/0.5ML Pen Inject 5 mg into the skin once a week. 2 mL 5   No facility-administered medications prior to visit.    PAST MEDICAL HISTORY: Past Medical History:  Diagnosis Date   ADHD    Anemia    IRON TRANSFUSION 07-2020 NONE SINCE   Anxiety    Back pain    COVID 08/12/2019   ALL SYMPTOMS REOLVED PER PT   Depression    Diabetic neuropathy (HCC) 11/11/2020   FEET   dm type 2    sees Dr. Ocie Cornfield at [facility]   Fatty liver    GERD (gastroesophageal reflux disease)    History of kidney stones    Joint pain    Menorrhagia 11/11/2020   Migraines    Murmur, cardiac    FAINT NO CARDIOLOGIST   Other fatigue    Pneumonia 08/12/2019   COVID PNEUMONIA ALL SYMPTOMS RESOLVED PER PT   Shortness of breath on exertion     PAST SURGICAL HISTORY: Past Surgical History:  Procedure Laterality Date   CESAREAN SECTION  12/13/2006   EXTRACORPOREAL SHOCK WAVE LITHOTRIPSY  2018   FOOT SURGERY Right 10/02/2020   RIGHT FOOT HEEL AND TOES, SUR[facility] OFF [address]   LAPAROSCOPIC VAGINAL HYSTERECTOMY WITH SALPINGECTOMY Bilateral 11/17/2020   Procedure: LAPAROSCOPIC ASSISTED VAGINAL  HYSTERECTOMY WITH BILATERAL SALPINGECTOMY;  Surgeon: Mitchel Honour, DO;  Location: [facility];  Service: Gynecology;  Laterality: Bilateral;    FAMILY HISTORY: Family History  Problem Relation Age of Onset  Depression Mother    Anxiety disorder Mother    Sleep apnea Father    Anxiety disorder Father    Depression Father    Diabetes Father    Bladder Cancer Father 48   Rheum arthritis Father    Migraines Maternal Grandmother    Diabetes Maternal Grandfather    Healthy Daughter    Asthma Daughter    Anxiety disorder Daughter    Anxiety disorder Son    Asthma Son    Healthy Son    Migraines Maternal Aunt    Cerebral aneurysm Cousin    Heart disease Other    Depression Other    Anxiety disorder Other    Sleep apnea Other    Colon cancer Neg Hx    Esophageal cancer Neg Hx    Stomach cancer Neg Hx    Rectal cancer Neg Hx     SOCIAL HISTORY: Social History   Socioeconomic History   Marital status: Married    Spouse name: Adam   Number of children: 2   Years of education: Not on file   Highest education level: 12th grade  Occupational History   Occupation: Arts administrator and Nutrition    Employer: [employer]    Comment: works 20 hours per week  Tobacco Use   Smoking status: Never    Passive exposure: Current   Smokeless tobacco: Never  Vaping Use   Vaping status: Never Used  Substance and Sexual Activity   Alcohol use: No    Alcohol/week: 0.0 standard drinks of alcohol   Drug use: No   Sexual activity: Yes    Partners: Male    Birth control/protection: None  Other Topics Concern   Not on file  Social History Narrative   Not on file   Social Determinants of Health   Financial Resource Strain: Low Risk  (09/19/2022)   Overall Financial Resource Strain (CARDIA)    Difficulty of Paying Living Expenses: Not hard at all  Food Insecurity: No Food Insecurity (09/19/2022)   Hunger Vital Sign    Worried About Running Out of Food in the Last Year:  Never true    Ran Out of Food in the Last Year: Never true  Transportation Needs: No Transportation Needs (09/19/2022)   PRAPARE - Administrator, Civil Service (Medical): No    Lack of Transportation (Non-Medical): No  Physical Activity: Insufficiently Active (06/04/2021)   Exercise Vital Sign    Days of Exercise per Week: 2 days    Minutes of Exercise per Session: 10 min  Stress: Stress Concern Present (09/19/2022)   [employer] of Occupational Health - Occupational Stress Questionnaire    Feeling of Stress : To some extent  Social Connections: Moderately Integrated (09/19/2022)   Social Connection and Isolation Panel [NHANES]    Frequency of Communication with Friends and Family: More than three times a week    Frequency of Social Gatherings with Friends and Family: Twice a week    Attends Religious Services: More than 4 times per year    Active Member of [employer] or Organizations: No    Attends Banker Meetings: Not on file    Marital Status: Married  Intimate Partner Violence: Unknown (07/17/2021)   Received from [facility], [facility]   HITS    Physically Hurt: Not on file    Insult or Talk Down To: Not on file    Threaten Physical Harm: Not on file    Scream or Curse: Not  on file   PHYSICAL EXAM  Vitals:   02/23/23 0904  BP: (!) 121/50  Pulse: 77  Weight: 216 lb 12.8 oz (98.3 kg)  Height: 5\' 6"  (1.676 m)    Body mass index is 34.99 kg/m.  Generalized: Well developed, in no acute distress   Neurological examination  Mentation: Alert oriented to time, place, history taking. Follows all commands speech and language fluent Cranial nerve II-XII: Pupils were equal round reactive to light. Extraocular movements were full, visual field were full on confrontational test. Facial sensation and strength were normal. Head turning and shoulder shrug  were normal and symmetric. Motor: The motor testing reveals 5 over 5 strength of all 4 extremities. Good  symmetric motor tone is noted throughout.  Sensory: Sensory testing is intact to soft touch on all 4 extremities. No evidence of extinction is noted.  Coordination: Cerebellar testing reveals good finger-nose-finger and heel-to-shin bilaterally.  Gait and station: Gait is normal.  Reflexes: Deep tendon reflexes are symmetric and normal bilaterally.   DIAGNOSTIC DATA (LABS, IMAGING, TESTING) - I reviewed patient records, labs, notes, testing and imaging myself where available.  Lab Results  Component Value Date   WBC 4.0 03/23/2022   HGB 13.3 03/23/2022   HCT 39.5 03/23/2022   MCV 84.3 03/23/2022   PLT 91.0 (L) 03/23/2022      Component Value Date/Time   NA 135 03/23/2022 0919   NA 138 08/19/2017 0000   K 3.4 (L) 03/23/2022 0919   CL 101 03/23/2022 0919   CO2 25 03/23/2022 0919   GLUCOSE 191 (H) 03/23/2022 0919   BUN 7 03/23/2022 0919   BUN 8 08/19/2017 0000   CREATININE 0.46 03/23/2022 0919   CREATININE 0.42 (L) 10/29/2021 0848   CALCIUM 8.9 03/23/2022 0919   PROT 7.1 03/23/2022 0919   ALBUMIN 3.8 03/23/2022 0919   AST 41 (H) 03/23/2022 0919   ALT 32 03/23/2022 0919   ALKPHOS 100 03/23/2022 0919   BILITOT 1.3 (H) 03/23/2022 0919   GFRNONAA >60 06/08/2021 0910   GFRAA >60 08/09/2019 0459   Lab Results  Component Value Date   CHOL 169 08/19/2017   HDL 38 08/19/2017   LDLCALC 91 08/19/2017   TRIG 202 (A) 08/19/2017   CHOLHDL 4 05/12/2015   Lab Results  Component Value Date   HGBA1C 11.1 09/28/2022   Lab Results  Component Value Date   VITAMINB12 499 06/09/2021   Lab Results  Component Value Date   TSH 1.170 06/09/2021   Margie Ege, AGNP-C, DNP 02/23/2023, 9:06 AM [facility] [address], [address] [place], [place] [address] [phone]

## 2023-02-23 ENCOUNTER — Encounter: Payer: Self-pay | Admitting: Neurology

## 2023-02-23 ENCOUNTER — Ambulatory Visit: Payer: BC Managed Care – PPO | Admitting: Neurology

## 2023-02-23 VITALS — BP 121/50 | HR 77 | Ht 66.0 in | Wt 216.8 lb

## 2023-02-23 DIAGNOSIS — G43819 Other migraine, intractable, without status migrainosus: Secondary | ICD-10-CM | POA: Diagnosis not present

## 2023-02-23 MED ORDER — PROPRANOLOL HCL 60 MG PO TABS: 60.0000 mg | ORAL_TABLET | Freq: Two times a day (BID) | ORAL | 3 refills | Status: DC

## 2023-02-23 MED ORDER — NORTRIPTYLINE HCL 10 MG PO CAPS: 30.0000 mg | ORAL_CAPSULE | Freq: Every day | ORAL | 3 refills | Status: DC

## 2023-02-23 NOTE — Patient Instructions (Signed)
We will continue current medications.  If he was to adjust her medications may consider injectable medicine like Ajovy for migraine prevention.  Please reach out via MyChart if you wish to make any changes.  Otherwise I will see back in 1 year.  Thanks!!

## 2023-03-09 ENCOUNTER — Inpatient Hospital Stay: Payer: BC Managed Care – PPO

## 2023-03-09 ENCOUNTER — Inpatient Hospital Stay: Payer: BC Managed Care – PPO | Attending: Hematology and Oncology | Admitting: Hematology and Oncology

## 2023-03-09 VITALS — BP 107/60 | HR 75 | Temp 98.3°F | Resp 16 | Ht 66.0 in | Wt 215.6 lb

## 2023-03-09 DIAGNOSIS — Z7985 Long-term (current) use of injectable non-insulin antidiabetic drugs: Secondary | ICD-10-CM | POA: Insufficient documentation

## 2023-03-09 DIAGNOSIS — K7460 Unspecified cirrhosis of liver: Secondary | ICD-10-CM | POA: Diagnosis not present

## 2023-03-09 DIAGNOSIS — N920 Excessive and frequent menstruation with regular cycle: Secondary | ICD-10-CM | POA: Diagnosis not present

## 2023-03-09 DIAGNOSIS — K219 Gastro-esophageal reflux disease without esophagitis: Secondary | ICD-10-CM | POA: Insufficient documentation

## 2023-03-09 DIAGNOSIS — E1140 Type 2 diabetes mellitus with diabetic neuropathy, unspecified: Secondary | ICD-10-CM | POA: Diagnosis not present

## 2023-03-09 DIAGNOSIS — K7581 Nonalcoholic steatohepatitis (NASH): Secondary | ICD-10-CM | POA: Diagnosis not present

## 2023-03-09 DIAGNOSIS — D6959 Other secondary thrombocytopenia: Secondary | ICD-10-CM | POA: Diagnosis present

## 2023-03-09 LAB — CBC WITH DIFFERENTIAL (CANCER CENTER ONLY)
Abs Immature Granulocytes: 0.01 10*3/uL (ref 0.00–0.07)
Basophils Absolute: 0 10*3/uL (ref 0.0–0.1)
Basophils Relative: 1 %
Eosinophils Absolute: 0.1 10*3/uL (ref 0.0–0.5)
Eosinophils Relative: 2 %
HCT: 37.7 % (ref 36.0–46.0)
Hemoglobin: 13 g/dL (ref 12.0–15.0)
Immature Granulocytes: 0 %
Lymphocytes Relative: 21 %
Lymphs Abs: 0.5 10*3/uL — ABNORMAL LOW (ref 0.7–4.0)
MCH: 30.2 pg (ref 26.0–34.0)
MCHC: 34.5 g/dL (ref 30.0–36.0)
MCV: 87.5 fL (ref 80.0–100.0)
Monocytes Absolute: 0.3 10*3/uL (ref 0.1–1.0)
Monocytes Relative: 12 %
Neutro Abs: 1.4 10*3/uL — ABNORMAL LOW (ref 1.7–7.7)
Neutrophils Relative %: 64 %
Platelet Count: 66 10*3/uL — ABNORMAL LOW (ref 150–400)
RBC: 4.31 MIL/uL (ref 3.87–5.11)
RDW: 14.9 % (ref 11.5–15.5)
Smear Review: NORMAL
WBC Count: 2.2 10*3/uL — ABNORMAL LOW (ref 4.0–10.5)
nRBC: 0 % (ref 0.0–0.2)

## 2023-03-09 LAB — CMP (CANCER CENTER ONLY)
ALT: 47 U/L — ABNORMAL HIGH (ref 0–44)
AST: 76 U/L — ABNORMAL HIGH (ref 15–41)
Albumin: 3.4 g/dL — ABNORMAL LOW (ref 3.5–5.0)
Alkaline Phosphatase: 150 U/L — ABNORMAL HIGH (ref 38–126)
Anion gap: 5 (ref 5–15)
BUN: <5 mg/dL — ABNORMAL LOW (ref 6–20)
CO2: 28 mmol/L (ref 22–32)
Calcium: 8.6 mg/dL — ABNORMAL LOW (ref 8.9–10.3)
Chloride: 100 mmol/L (ref 98–111)
Creatinine: 0.54 mg/dL (ref 0.44–1.00)
GFR, Estimated: >60 mL/min (ref >60)
Glucose, Bld: 364 mg/dL — ABNORMAL HIGH (ref 70–99)
Potassium: 3.2 mmol/L — ABNORMAL LOW (ref 3.5–5.1)
Sodium: 133 mmol/L — ABNORMAL LOW (ref 135–145)
Total Bilirubin: 1.7 mg/dL — ABNORMAL HIGH (ref <1.2)
Total Protein: 7 g/dL (ref 6.5–8.1)

## 2023-03-09 LAB — WBC/PLT IN CITRATE

## 2023-03-09 LAB — VITAMIN B12: Vitamin B-12: 504 pg/mL (ref 180–914)

## 2023-03-09 LAB — IMMATURE PLATELET FRACTION: Immature Platelet Fraction: 4.8 % (ref 1.2–8.6)

## 2023-03-09 LAB — FOLATE: Folate: >40 ng/mL (ref >5.9)

## 2023-03-09 NOTE — Progress Notes (Signed)
[facility] [phone]   [phone]  INITIAL CONSULT NOTE  Patient Care Team: Nelwyn Salisbury, MD as PCP - General (Family Medicine) Ocie Cornfield, MD as Referring Physician (Internal Medicine)  Hematological/Oncological History # Thrombocytopenia in Setting of Cirrhosis  01/06/2021: seen by Dr. Bertis Ruddy in Hematology  03/09/2023: establish care with Dr. Leonides Schanz   CHIEF COMPLAINTS/PURPOSE OF CONSULTATION:  "Thrombocytopenia in Setting of Cirrhosis  "  HISTORY OF PRESENTING ILLNESS:  Laurie Sutton 42 y.o. female with medical history significant for NASH cirrhosis, GERD, menorrhagia, and DM type II with LE neuropathy who presents for evaluation of thrombocytopenia.   On review of the previous records Laurie Sutton has a known history of [name] cirrhosis for which she sees Laurie Sutton and gastroenterology.  She last underwent an ultrasound of her liver in December 2023 which showed clear evidence of splenomegaly and cirrhosis.  Due to concern for her thrombocytopenia she was referred to hematology for further evaluation and management.  Of note she was previously seen on 01/06/2021 by Dr. Bertis Ruddy and was subsequently discharged without follow-up.  On exam today Laurie Sutton reports that she has been going through some emotional difficulty as her mother passed away.  Her mother was Laurie Sutton who was another patient of our clinic.  She reports that she died of a stroke.  She reports that she personally has not been having any issues with bleeding, bruising, or dark stools, though she does have some occasional gum bleeding.  She reports that she underwent hysterectomy 2 years ago in 2022 and has not had any GYN bleeding since that time.  She reports her energy levels have been low and she reports they are about a 3 out of 10 today.  She notes that she has "good days and bad days".  She notes that her appetite is suppressed that she is currently taking Mounjaro.   Her only infectious symptoms that she has some boils that she develops on her privates in her arms.  She reports that she does have an occasional yeast infection as well.  She is a never smoker never drinker.  She otherwise denies any current fevers chills sweats nausea vomiting or diarrhea.  A full 10 point ROS is otherwise negative.  MEDICAL HISTORY:  Past Medical History:  Diagnosis Date   ADHD    Anemia    IRON TRANSFUSION 07-2020 NONE SINCE   Anxiety    Back pain    COVID 08/12/2019   ALL SYMPTOMS REOLVED PER PT   Depression    Diabetic neuropathy (HCC) 11/11/2020   FEET   dm type 2    sees Dr. Ocie Cornfield at [facility]   Fatty liver    GERD (gastroesophageal reflux disease)    History of kidney stones    Joint pain    Menorrhagia 11/11/2020   Migraines    Murmur, cardiac    FAINT NO CARDIOLOGIST   Other fatigue    Pneumonia 08/12/2019   COVID PNEUMONIA ALL SYMPTOMS RESOLVED PER PT   Shortness of breath on exertion     SURGICAL HISTORY: Past Surgical History:  Procedure Laterality Date   CESAREAN SECTION  12/13/2006   EXTRACORPOREAL SHOCK WAVE LITHOTRIPSY  2018   FOOT SURGERY Right 10/02/2020   RIGHT FOOT HEEL AND TOES, SURGICAL CENTER OFF [address]   LAPAROSCOPIC VAGINAL HYSTERECTOMY WITH SALPINGECTOMY Bilateral 11/17/2020   Procedure: LAPAROSCOPIC ASSISTED VAGINAL HYSTERECTOMY WITH BILATERAL SALPINGECTOMY;  Surgeon: Mitchel Honour, DO;  Location: [facility]  [place];  Service: Gynecology;  Laterality: Bilateral;    SOCIAL HISTORY: Social History   Socioeconomic History   Marital status: Married    Spouse name: Adam   Number of children: 2   Years of education: Not on file   Highest education level: 12th grade  Occupational History   Occupation: Arts administrator and Nutrition    Employer: [employer]    Comment: works 20 hours per week  Tobacco Use   Smoking status: Never    Passive exposure: Current   Smokeless tobacco: Never  Vaping  Use   Vaping status: Never Used  Substance and Sexual Activity   Alcohol use: No    Alcohol/week: 0.0 standard drinks of alcohol   Drug use: No   Sexual activity: Yes    Partners: Male    Birth control/protection: None  Other Topics Concern   Not on file  Social History Narrative   Not on file   Social Determinants of Health   Financial Resource Strain: Low Risk  (09/19/2022)   Overall Financial Resource Strain (CARDIA)    Difficulty of Paying Living Expenses: Not hard at all  Food Insecurity: No Food Insecurity (09/19/2022)   Hunger Vital Sign    Worried About Running Out of Food in the Last Year: Never true    Ran Out of Food in the Last Year: Never true  Transportation Needs: No Transportation Needs (09/19/2022)   PRAPARE - Administrator, Civil Service (Medical): No    Lack of Transportation (Non-Medical): No  Physical Activity: Insufficiently Active (06/04/2021)   Exercise Vital Sign    Days of Exercise per Week: 2 days    Minutes of Exercise per Session: 10 min  Stress: Stress Concern Present (09/19/2022)   [employer] of Occupational Health - Occupational Stress Questionnaire    Feeling of Stress : To some extent  Social Connections: Moderately Integrated (09/19/2022)   Social Connection and Isolation Panel [NHANES]    Frequency of Communication with Friends and Family: More than three times a week    Frequency of Social Gatherings with Friends and Family: Twice a week    Attends Religious Services: More than 4 times per year    Active Member of [employer] or Organizations: No    Attends Engineer, structural: Not on file    Marital Status: Married  Intimate Partner Violence: Unknown (07/17/2021)   Received from [employer]   HITS    Physically Hurt: Not on file    Insult or Talk Down To: Not on file    Threaten Physical Harm: Not on file    Scream or Curse: Not on file    FAMILY HISTORY: Family History  Problem Relation Age of Onset    Depression Mother    Anxiety disorder Mother    Sleep apnea Father    Anxiety disorder Father    Depression Father    Diabetes Father    Bladder Cancer Father 32   Rheum arthritis Father    Migraines Maternal Grandmother    Diabetes Maternal Grandfather    Healthy Daughter    Asthma Daughter    Anxiety disorder Daughter    Anxiety disorder Son    Asthma Son    Healthy Son    Migraines Maternal Aunt    Cerebral aneurysm Cousin    Heart disease Other    Depression Other    Anxiety disorder Other    Sleep apnea Other  Colon cancer Neg Hx    Esophageal cancer Neg Hx    Stomach cancer Neg Hx    Rectal cancer Neg Hx     ALLERGIES:  is allergic to amoxicillin, azithromycin, dulaglutide, empagliflozin-metformin hcl er, [place] [sitagliptin], latex, metformin, and penicillins.  MEDICATIONS:  Current Outpatient Medications  Medication Sig Dispense Refill   albuterol (VENTOLIN HFA) 108 (90 Base) MCG/ACT inhaler INHALE 2 PUFFS INTO THE LUNGS UP TO EVERY 6 HOURS AS NEEDED FOR WHEEZING/SHORTNESS OF BREATH 18 each 0   clotrimazole-betamethasone (LOTRISONE) cream APPLY 1 APPLICATION TOPICALLY TWICE A DAY AS NEEDED 30 g 1   Continuous Blood Gluc Sensor (FREESTYLE LIBRE 14 DAY SENSOR) MISC Apply topically every 14 (fourteen) days.     Continuous Glucose Sensor (FREESTYLE LIBRE 3 SENSOR) MISC Apply sensor to upper back of arm, change sensor every 14 days. 2 each 11   Cranberry 125 MG TABS Take by mouth 2 (two) times daily.     ergocalciferol (VITAMIN D2) 1.25 MG (50000 UT) capsule Take 50,000 Units by mouth once a week. (Patient not taking: Reported on 03/09/2023)     FLUoxetine (PROZAC) 40 MG capsule TAKE 1 CAPSULE BY MOUTH TWICE A DAY 180 capsule 0   gabapentin (NEURONTIN) 100 MG capsule Take 1 capsule (100 mg total) by mouth 2 (two) times daily.     HUMULIN R U-500 KWIKPEN 500 UNIT/ML KwikPen Use 120 units at breakfast, 100 at lunch, and 100 at dinner (Patient taking differently: Use 60  units at breakfast, 70 at lunch, and 70 at dinner) 1 mL 0   LINZESS 145 MCG CAPS capsule TAKE 1 CAPSULE BY MOUTH DAILY BEFORE BREAKFAST. 90 capsule 0   LORazepam (ATIVAN) 0.5 MG tablet Take 1 tablet (0.5 mg total) by mouth 2 (two) times daily as needed for anxiety. (Patient not taking: Reported on 03/09/2023) 60 tablet 5   methylPREDNISolone (MEDROL DOSEPAK) 4 MG TBPK tablet 6 day dose pack - take as directed (Patient not taking: Reported on 03/09/2023) 21 tablet 0   montelukast (SINGULAIR) 10 MG tablet Take 1 tablet (10 mg total) by mouth at bedtime. 90 tablet 3   nortriptyline (PAMELOR) 10 MG capsule Take 3 capsules (30 mg total) by mouth at bedtime. 270 capsule 3   omeprazole (PRILOSEC) 40 MG capsule TAKE 1 CAPSULE (40 MG TOTAL) BY MOUTH IN THE MORNING 90 capsule 1   ondansetron (ZOFRAN-ODT) 4 MG disintegrating tablet Take 1 tablet (4 mg total) by mouth every 8 (eight) hours as needed for nausea or vomiting. 18 tablet 0   propranolol (INDERAL) 60 MG tablet Take 1 tablet (60 mg total) by mouth 2 (two) times daily. 180 tablet 3   tirzepatide (MOUNJARO) 10 MG/0.5ML Pen Inject 10 mg into the skin once a week. 2 mL 3   No current facility-administered medications for this visit.    REVIEW OF SYSTEMS:   Constitutional: ( - ) fevers, ( - )  chills , ( - ) night sweats Eyes: ( - ) blurriness of vision, ( - ) double vision, ( - ) watery eyes Ears, nose, mouth, throat, and face: ( - ) mucositis, ( - ) sore throat Respiratory: ( - ) cough, ( - ) dyspnea, ( - ) wheezes Cardiovascular: ( - ) palpitation, ( - ) chest discomfort, ( - ) lower extremity swelling Gastrointestinal:  ( - ) nausea, ( - ) heartburn, ( - ) change in bowel habits Skin: ( - ) abnormal skin rashes Lymphatics: ( - ) new lymphadenopathy, ( - )  easy bruising Neurological: ( - ) numbness, ( - ) tingling, ( - ) new weaknesses Behavioral/Psych: ( - ) mood change, ( - ) new changes  All other systems were reviewed with the patient and  are negative.  PHYSICAL EXAMINATION:  Vitals:   03/09/23 1339  BP: 107/60  Pulse: 75  Resp: 16  Temp: 98.3 F (36.8 C)  SpO2: 100%   Filed Weights   03/09/23 1339  Weight: 215 lb 9.6 oz (97.8 kg)    GENERAL: well appearing middle-age Caucasian female in NAD  SKIN: skin color, texture, turgor are normal, no rashes or significant lesions EYES: conjunctiva are pink and non-injected, sclera clear LUNGS: clear to auscultation and percussion with normal breathing effort HEART: regular rate & rhythm and no murmurs and no lower extremity edema Musculoskeletal: no cyanosis of digits and no clubbing  PSYCH: alert & oriented x 3, fluent speech NEURO: no focal motor/sensory deficits  LABORATORY DATA:  I have reviewed the data as listed    Latest Ref Rng & Units 03/09/2023    2:36 PM 03/23/2022    9:19 AM 10/29/2021    8:48 AM  CBC  WBC 4.0 - 10.5 K/uL 2.2  4.0  2.8   Hemoglobin 12.0 - 15.0 g/dL 16.1  09.6  04.5   Hematocrit 36.0 - 46.0 % 37.7  39.5  40.5   Platelets 150 - 400 K/uL 66  91.0  83        Latest Ref Rng & Units 03/09/2023    2:36 PM 03/23/2022    9:19 AM 10/29/2021    8:48 AM  CMP  Glucose 70 - 99 mg/dL 409  811  914   BUN 6 - 20 mg/dL 5  7  4    Creatinine 0.44 - 1.00 mg/dL 7.82  9.56  2.13   Sodium 135 - 145 mmol/L 133  135  135   Potassium 3.5 - 5.1 mmol/L 3.2  3.4  3.9   Chloride 98 - 111 mmol/L 100  101  100   CO2 22 - 32 mmol/L 28  25  25    Calcium 8.9 - 10.3 mg/dL 8.6  8.9  9.3   Total Protein 6.5 - 8.1 g/dL 7.0  7.1  7.3   Total Bilirubin <1.2 mg/dL 1.7  1.3  0.8   Alkaline Phos 38 - 126 U/L 150  100    AST 15 - 41 U/L 76  41  53   ALT 0 - 44 U/L 47  32  36      ASSESSMENT & PLAN Stan Head Burmester 42 y.o. female with medical history significant for NASH cirrhosis, GERD, menorrhagia, and DM type II with LE neuropathy who presents for evaluation of thrombocytopenia.   After review of the labs, review of the records, and discussion with the patient  the patients findings are most consistent with thrombocytopenia in the setting of cirrhosis and splenomegaly.  In order to rule out other etiologies will order full nutritional panel, immature platelet fraction, and platelet in citrate.  In the event our workup is negative would recommend routine follow-up with gastroenterology.  Thrombocytopenia is a common condition with a broad differential. The possible etiologies of thrombocytopenia include liver disease, splenomegaly, infectious process, nutritional deficiency, consumption/autoimmune destruction, pseudothrombocytopenia, and bone marrow disorders. Cirrhosis and liver disease the most common causes of moderate thrombocytopenia. Evaluation should include full hepatitis serologies (Hep B and C) as well as HIV. Imaging of the liver spleen should be performed with an  abdominal US ( if prior imaging is no readily available). Nutritional etiologies should be ruled out with Vitamin b12 and folate testing.  A peripheral blood smear can help determine if there is clumping leading to pseudothrombocytopenia.  #Thrombocytopenia in Setting of Cirrhosis of the Liver  --will order CBC, CMP, Vitamin b12 and folate.   --will order immature platelet fraction and platelet in citrate.  --viral serologies with Hepatitis B, Hepatitis C negative in 2022  --known history of liver disease, cirrhotic morphology noted on last [name] in Dec 2023.  --RTC pending the results of the above studies.    Orders Placed This Encounter  Procedures   CBC with Differential (Cancer Center Only)    Standing Status:   Future    Number of Occurrences:   1    Standing Expiration Date:   03/08/2024   CMP (Cancer Center only)    Standing Status:   Future    Number of Occurrences:   1    Standing Expiration Date:   03/08/2024   Immature Platelet Fraction    Standing Status:   Future    Number of Occurrences:   1    Standing Expiration Date:   03/08/2024   WBC/PLT in Citrate   Vitamin B12     Standing Status:   Future    Number of Occurrences:   1    Standing Expiration Date:   03/08/2024   Methylmalonic acid, serum    Standing Status:   Future    Number of Occurrences:   1    Standing Expiration Date:   03/08/2024   Folate, Serum    Standing Status:   Future    Number of Occurrences:   1    Standing Expiration Date:   03/08/2024    All questions were answered. The patient knows to call the clinic with any problems, questions or concerns.  A total of more than 40 minutes were spent on this encounter with face-to-face time and non-face-to-face time, including preparing to see the patient, ordering tests and/or medications, counseling the patient and coordination of care as outlined above.   Ulysees Barns, MD Department of Hematology/Oncology [facility] at [facility] Phone: [phone] Pager: [phone] Email: [email or web address]  03/09/2023 4:12 PM

## 2023-03-13 LAB — METHYLMALONIC ACID, SERUM: Methylmalonic Acid, Quantitative: 122 nmol/L (ref 0–378)

## 2023-03-16 ENCOUNTER — Other Ambulatory Visit: Payer: Self-pay

## 2023-03-16 ENCOUNTER — Other Ambulatory Visit: Payer: Self-pay | Admitting: Gastroenterology

## 2023-03-16 ENCOUNTER — Other Ambulatory Visit: Payer: Self-pay | Admitting: Family Medicine

## 2023-03-16 ENCOUNTER — Other Ambulatory Visit (HOSPITAL_COMMUNITY): Payer: Self-pay

## 2023-03-16 DIAGNOSIS — K5909 Other constipation: Secondary | ICD-10-CM

## 2023-03-17 ENCOUNTER — Ambulatory Visit: Payer: BC Managed Care – PPO | Admitting: Nurse Practitioner

## 2023-03-18 ENCOUNTER — Other Ambulatory Visit (HOSPITAL_COMMUNITY): Payer: Self-pay

## 2023-03-18 ENCOUNTER — Other Ambulatory Visit: Payer: Self-pay

## 2023-03-21 ENCOUNTER — Telehealth: Payer: Self-pay | Admitting: *Deleted

## 2023-03-21 NOTE — Telephone Encounter (Signed)
-----   Message from Ulysees Barns IV sent at 03/19/2023  9:50 AM EST ----- Please let Mrs. Aki know that her low platelets are likely due to her liver disease. We did not find any other clear causes (such as nutritional deficiency or platelet disorder). There is no need for routine follow up in our clinic. ----- Message ----- From: Leory Plowman, Lab In [place] Sent: 03/09/2023   3:31 PM EST To: Jaci Standard, MD

## 2023-03-21 NOTE — Telephone Encounter (Signed)
TCT patient regarding recent lab results.  Spoke to her .  Advised that her low platelets are likely due to her liver disease. We did not find any other clear causes (such as nutritional deficiency or platelet disorder). There is no need for routine follow up in our clinic.  Pt voiced understanding.

## 2023-04-14 ENCOUNTER — Ambulatory Visit: Payer: BC Managed Care – PPO | Admitting: Gastroenterology

## 2023-04-20 ENCOUNTER — Other Ambulatory Visit: Payer: Self-pay

## 2023-04-20 ENCOUNTER — Other Ambulatory Visit (INDEPENDENT_AMBULATORY_CARE_PROVIDER_SITE_OTHER): Payer: 59

## 2023-04-20 ENCOUNTER — Encounter: Payer: Self-pay | Admitting: Gastroenterology

## 2023-04-20 ENCOUNTER — Ambulatory Visit: Payer: Self-pay | Admitting: Gastroenterology

## 2023-04-20 VITALS — BP 96/60 | HR 96 | Ht 66.0 in | Wt 218.0 lb

## 2023-04-20 DIAGNOSIS — K625 Hemorrhage of anus and rectum: Secondary | ICD-10-CM | POA: Diagnosis not present

## 2023-04-20 DIAGNOSIS — I8510 Secondary esophageal varices without bleeding: Secondary | ICD-10-CM | POA: Diagnosis not present

## 2023-04-20 DIAGNOSIS — K5909 Other constipation: Secondary | ICD-10-CM

## 2023-04-20 DIAGNOSIS — R7989 Other specified abnormal findings of blood chemistry: Secondary | ICD-10-CM

## 2023-04-20 DIAGNOSIS — K7581 Nonalcoholic steatohepatitis (NASH): Secondary | ICD-10-CM

## 2023-04-20 DIAGNOSIS — K7460 Unspecified cirrhosis of liver: Secondary | ICD-10-CM | POA: Diagnosis not present

## 2023-04-20 LAB — PROTIME-INR
INR: 1.6 {ratio} — ABNORMAL HIGH (ref 0.8–1.0)
Prothrombin Time: 17.1 s — ABNORMAL HIGH (ref 9.6–13.1)

## 2023-04-20 MED ORDER — SUTAB 1479-225-188 MG PO TABS: ORAL_TABLET | ORAL | 0 refills | Status: DC

## 2023-04-20 NOTE — Patient Instructions (Signed)
 Your provider has requested that you go to the basement level for lab work before leaving today. Press B on the elevator. The lab is located at the first door on the left as you exit the elevator.  You have been scheduled for an abdominal ultrasound at [facility] (1st floor of hospital) on Please arrive 30 minutes prior to your appointment for registration. Make certain not to have anything to eat or drink 6 hours prior to your appointment. Should you need to reschedule your appointment, please contact radiology at [phone]. This test typically takes about 30 minutes to perform.   You have been scheduled for a colonoscopy/endoscopy . Please follow written instructions given to you at your visit today.   Please pick up your prep supplies at the pharmacy within the next 1-3 days.  If you use inhalers (even only as needed), please bring them with you on the day of your procedure.  DO NOT TAKE 7 DAYS PRIOR TO TEST- Trulicity  (dulaglutide ) Ozempic , Wegovy  (semaglutide ) Mounjaro  (tirzepatide ) Bydureon Bcise (exanatide extended release)  DO NOT TAKE 1 DAY PRIOR TO YOUR TEST Rybelsus  (semaglutide ) Adlyxin (lixisenatide) Victoza  (liraglutide ) Byetta (exanatide) ___________________________________________________________________________  _______________________________________________________  If your blood pressure at your visit was 140/90 or greater, please contact your primary care physician to follow up on this.  _______________________________________________________  If you are age 45 or older, your body mass index should be between 23-30. Your Body mass index is 35.19 kg/m. If this is out of the aforementioned range listed, please consider follow up with your Primary Care Provider.  If you are age 50 or younger, your body mass index should be between 19-25. Your Body mass index is 35.19 kg/m. If this is out of the aformentioned range listed, please consider follow up  with your Primary Care Provider.   ________________________________________________________  The [facility] providers would like to encourage you to use MYCHART to communicate with providers for non-urgent requests or questions.  Due to long hold times on the telephone, sending your provider a message by [facility] may be a faster and more efficient way to get a response.  Please allow 48 business hours for a response.  Please remember that this is for non-urgent requests.  _______________________________________________________ It was a pleasure to see you today!  Thank you for trusting me with your gastrointestinal care!

## 2023-04-20 NOTE — Progress Notes (Addendum)
 [facility] Progress Note  Chief Complaint: Cirrhosis and constipation  Subjective  Prior history  Review of pertinent gastrointestinal problems: 1.  GERD without alarm symptoms evaluated 2016 Dr. Teressa.  She was drinking multiple (5-10) cans of caffeinated soda daily, also 10-15 peppermint candies daily.  I recommended lifestyle modifications and observation.  She was going to try to wean off of proton pump inhibitor over time as well. 2.  Upper abdominal pain led to EGD November 2021.  Mild distal gastritis was noted, biopsies were negative for H. Pylori. 3.  Likely fatty liver.  ?  Cirrhosis.  Ultrasound September 2016 showed enlarged liver with increased echogenicity suggestive of steatosis.  Splenomegaly.  CT scan September 2016 showed fatty infiltration of the liver. Ultrasound of liver with elastography June 2022 showed a median kPa of 4.9.  This falls into the diagnostic category of high probability of being normal CT scan abdomen pelvis with IV and oral contrast August 2022 indication evaluate for periumbilical hernia suggested morphologic signs of cirrhosis. Lab work-up for chronic liver disease December 2022 : hepatitis A total antibody reactive, hepatitis C antibody negative, hepatitis B surface antigen negative, hepatitis B surface antibody negative, ceruloplasmin normal, anti-smooth muscle antibody IgG slightly elevated at 23, ANA positive titer 1:80, AMA negative, alpha-1 antitrypsin level normal, If cirrhosis, MELD 7 based on 12/22 labs: AST, ALT both slightly elevated, platelets 115 ____________  Saw Dr. Legrand December 2023 and January 2024, constipation was worsened on GLP-1 agonist, especially after a dose increase.  However, patient felt benefit of the medicine was worth continuing it as she is hoping to lose enough weight that she might be a candidate for bariatric surgery. Samples of Linzess  were given.  Later messages from her indicated that it was working  somewhat and she requested a prescription.  February 2024 requested a dose increase from 72 to 145 mcg.  EGD by Dr. Teressa March 2023 (done for heartburn) reveals small distal esophageal varices, they appear grade 1-2 by photographs.  _______________________ From November 2024 Dr. Legrand telephone note: This is a former patient of Dr. Teressa whose care I took over late last year.  She has chronic constipation on Linzess  and NASH related cirrhosis.  Last office visit here 05/03/2022.   I received some lab work from her primary care provider:   02/17/2023 albumin  3.4, alkaline phosphatase 126, AST 58, ALT 43, total bilirubin 1.4, direct bilirubin 0.4   There are no additional lab test results, but some notes from her provider below the LFT results suggest her hemoglobin A1c is improving, previous AST had been 75, and platelets have dropped from 101 to 75  Discussed the use of AI scribe software for clinical note transcription with the patient, who gave verbal consent to proceed.  History of Present Illness   The patient, with a history of cirrhosis, has been managing constipation with Linzess , stool softeners, and prune tablets. They report that this regimen has improved bowel movement frequency to almost daily, with occasional lapses of two to three days. The patient notes that missing a dose of Linzess  can disrupt this pattern. They also report occasional rectal bleeding, which they attribute to straining or hemorrhoids, and is more likely to occur after a period of constipation. The patient does not express concern about this symptom.  In addition to constipation, the patient has been managing weight loss with medication(Mounjaro ), reporting a loss of approximately 35 pounds over the past year and a half. They had previously  considered weight loss surgery, but this was deemed unsuitable due to their liver condition. The patient also reports fatigue, but it is unclear whether this is related to  their liver condition.  The patient has been taking Mounjaro  for an unspecified condition, and reports experiencing vomiting after increasing the dose. However, they note that these side effects have lessened over time.     ROS: Cardiovascular:  no chest pain Respiratory: no dyspnea Today she also has fatigue and nasal/sinus congestion without a cough. The patient's Past Medical, Family and Social History were reviewed and are on file in the EMR. Past Medical History:  Diagnosis Date   ADHD    Anemia    IRON TRANSFUSION 07-2020 NONE SINCE   Anxiety    Back pain    COVID 08/12/2019   ALL SYMPTOMS REOLVED PER PT   Depression    Diabetic neuropathy (HCC) 11/11/2020   FEET   dm type 2    sees Dr. Odella Jacobson at [facility]   Fatty liver    GERD (gastroesophageal reflux disease)    History of kidney stones    Joint pain    Menorrhagia 11/11/2020   Migraines    Murmur, cardiac    FAINT NO CARDIOLOGIST   Other fatigue    Pneumonia 08/12/2019   COVID PNEUMONIA ALL SYMPTOMS RESOLVED PER PT   Shortness of breath on exertion     Past Surgical History:  Procedure Laterality Date   CESAREAN SECTION  12/13/2006   EXTRACORPOREAL SHOCK WAVE LITHOTRIPSY  2018   FOOT SURGERY Right 10/02/2020   RIGHT FOOT HEEL AND TOES, [facility] OFF [address]   LAPAROSCOPIC VAGINAL HYSTERECTOMY WITH SALPINGECTOMY Bilateral 11/17/2020   Procedure: LAPAROSCOPIC ASSISTED VAGINAL HYSTERECTOMY WITH BILATERAL SALPINGECTOMY;  Surgeon: Dannielle Bouchard, DO;  Location: [facility];  Service: Gynecology;  Laterality: Bilateral;     Objective:  Med list reviewed  Current Outpatient Medications:    albuterol  (VENTOLIN  HFA) 108 (90 Base) MCG/ACT inhaler, INHALE 2 PUFFS INTO THE LUNGS UP TO EVERY 6 HOURS AS NEEDED FOR WHEEZING/SHORTNESS OF BREATH, Disp: 18 each, Rfl: 0   clotrimazole -betamethasone  (LOTRISONE ) cream, APPLY 1 APPLICATION TOPICALLY TWICE A DAY AS NEEDED, Disp: 30 g, Rfl:  1   Continuous Blood Gluc Sensor (FREESTYLE LIBRE 14 DAY SENSOR) MISC, Apply topically every 14 (fourteen) days., Disp: , Rfl:    Continuous Glucose Sensor (FREESTYLE LIBRE 3 SENSOR) MISC, Apply sensor to upper back of arm, change sensor every 14 days., Disp: 2 each, Rfl: 11   Cranberry 125 MG TABS, Take by mouth 2 (two) times daily., Disp: , Rfl:    FLUoxetine  (PROZAC ) 40 MG capsule, TAKE 1 CAPSULE BY MOUTH TWICE A DAY, Disp: 180 capsule, Rfl: 0   gabapentin  (NEURONTIN ) 100 MG capsule, Take 1 capsule (100 mg total) by mouth 2 (two) times daily., Disp: , Rfl:    HUMULIN  R U-500 KWIKPEN 500 UNIT/ML KwikPen, Use 120 units at breakfast, 100 at lunch, and 100 at dinner (Patient taking differently: Use 60 units at breakfast, 70 at lunch, and 70 at dinner), Disp: 1 mL, Rfl: 0   LINZESS  145 MCG CAPS capsule, TAKE 1 CAPSULE BY MOUTH DAILY BEFORE BREAKFAST., Disp: 90 capsule, Rfl: 0   montelukast  (SINGULAIR ) 10 MG tablet, Take 1 tablet (10 mg total) by mouth at bedtime., Disp: 90 tablet, Rfl: 3   nortriptyline  (PAMELOR ) 10 MG capsule, Take 3 capsules (30 mg total) by mouth at bedtime., Disp: 270 capsule, Rfl: 3  omeprazole  (PRILOSEC) 40 MG capsule, TAKE 1 CAPSULE (40 MG TOTAL) BY MOUTH IN THE MORNING, Disp: 90 capsule, Rfl: 0   ondansetron  (ZOFRAN -ODT) 4 MG disintegrating tablet, Take 1 tablet (4 mg total) by mouth every 8 (eight) hours as needed for nausea or vomiting., Disp: 18 tablet, Rfl: 0   propranolol  (INDERAL ) 60 MG tablet, Take 1 tablet (60 mg total) by mouth 2 (two) times daily., Disp: 180 tablet, Rfl: 3   Sodium Sulfate-Mag Sulfate-KCl (SUTAB ) 1479-225-188 MG TABS, Use as directed for colonoscopy. MANUFACTURER CODES!! BIN: [id number] PCN: CN GROUP: [id number] MEMBER ID: [id number];MLW AS SECONDARY INSURANCE ;NO PRIOR AUTHORIZATION, Disp: 24 tablet, Rfl: 0   tirzepatide  (MOUNJARO ) 10 MG/0.5ML Pen, Inject 10 mg into the skin once a week., Disp: 2 mL, Rfl: 3   ergocalciferol  (VITAMIN D2) 1.25 MG (50000  UT) capsule, Take 50,000 Units by mouth once a week. (Patient not taking: Reported on 04/20/2023), Disp: , Rfl:    LORazepam  (ATIVAN ) 0.5 MG tablet, Take 1 tablet (0.5 mg total) by mouth 2 (two) times daily as needed for anxiety. (Patient not taking: Reported on 04/20/2023), Disp: 60 tablet, Rfl: 5   methylPREDNISolone  (MEDROL  DOSEPAK) 4 MG TBPK tablet, 6 day dose pack - take as directed (Patient not taking: Reported on 04/20/2023), Disp: 21 tablet, Rfl: 0   Vital signs in last 24 hrs: Vitals:   04/20/23 1417  BP: 96/60  Pulse: 96   Wt Readings from Last 3 Encounters:  04/20/23 218 lb (98.9 kg)  03/09/23 215 lb 9.6 oz (97.8 kg)  02/23/23 216 lb 12.8 oz (98.3 kg)    Physical Exam  Well-appearing. HEENT: sclera anicteric, oral mucosa moist without lesions Neck: supple, no thyromegaly, JVD or lymphadenopathy Cardiac: Rhythm regular without appreciable murmur,  no peripheral edema Pulm: clear to auscultation bilaterally, normal RR and effort noted Abdomen: soft, no.  Tenderness, with active bowel sounds. No guarding or palpable hepatosplenomegaly.  No bulging flanks. Skin; warm and dry, no jaundice or rash   Labs:     Latest Ref Rng & Units 03/09/2023    2:36 PM 03/23/2022    9:19 AM 10/29/2021    8:48 AM  CBC  WBC 4.0 - 10.5 K/uL 2.2  4.0  2.8   Hemoglobin 12.0 - 15.0 g/dL 86.9  86.6  86.6   Hematocrit 36.0 - 46.0 % 37.7  39.5  40.5   Platelets 150 - 400 K/uL 66  91.0  83       Latest Ref Rng & Units 03/09/2023    2:36 PM 03/23/2022    9:19 AM 10/29/2021    8:48 AM  CMP  Glucose 70 - 99 mg/dL 635  808  660   BUN 6 - 20 mg/dL 5  7  4    Creatinine 0.44 - 1.00 mg/dL 9.45  9.53  9.57   Sodium 135 - 145 mmol/L 133  135  135   Potassium 3.5 - 5.1 mmol/L 3.2  3.4  3.9   Chloride 98 - 111 mmol/L 100  101  100   CO2 22 - 32 mmol/L 28  25  25    Calcium  8.9 - 10.3 mg/dL 8.6  8.9  9.3   Total Protein 6.5 - 8.1 g/dL 7.0  7.1  7.3   Total Bilirubin <1.2 mg/dL 1.7  1.3  0.8   Alkaline  Phos 38 - 126 U/L 150  100    AST 15 - 41 U/L 76  41  53   ALT 0 -  44 U/L 47  32  36    Last INR was 1.3 in December 2023  Last AFP normal at 5.8 in December 2023  Hemoglobin A1c was 9.1 in December 2023 and 11.1 in June 2024 ___________________________________________ Radiologic studies:  (Most recent liver ultrasound from December 2023)  CLINICAL DATA:  HCC Screening   NASH   EXAM: DUPLEX ULTRASOUND OF LIVER   TECHNIQUE: Color and duplex Doppler ultrasound was performed to evaluate the hepatic in-flow and out-flow vessels.   COMPARISON:  CT abdomen pelvis 07/22/2021   FINDINGS: Liver: Diffuse heterogeneity of the parenchyma. Mild nodularity hepatic contours.   No focal lesion, mass or intrahepatic biliary ductal dilatation.   Gallbladder: Minimal cholelithiasis.  Otherwise unremarkable.   Main Portal Vein size: 1.5 cm   Portal Vein Velocities   Main Prox:  18 cm/sec   Main Dist:  23 cm/sec Right: 20 cm/sec Left: 18 cm/sec   Hepatic Vein Velocities   Right:  46 cm/sec   Middle:  28 cm/sec   Left:  30 cm/sec   IVC: Present and patent with normal respiratory phasicity.   Hepatic Artery Velocity:  54 cm/sec   Splenic Vein Velocity:  80 cm/sec   Spleen: 20 cm x 15 cm x 20 cm with a total volume of 3,836 cm^3 (411 cm^3 is upper limit normal)   Portal Vein Occlusion/Thrombus: No   Splenic Vein Occlusion/Thrombus: No   Ascites: None   Varices: None   IMPRESSION: 1. Cirrhotic liver morphology without focal hepatic lesion. 2. Splenomegaly. 3. Patent portal vein with appropriate direction of flow.     Electronically Signed   By: Aliene Lloyd M.D.   On: 03/29/2022 08:34 ____________________________________________ Other:   _____________________________________________   Encounter Diagnoses  Name Primary?   Liver cirrhosis secondary to NASH (nonalcoholic steatohepatitis) (HCC) Yes   Secondary esophageal varices without bleeding (HCC)     Chronic constipation    Rectal bleeding    LFTs abnormal     Assessment and Plan    Chronic Constipation Improved with Linzess  145mcg daily, stool softeners, and prune tablets. Bowel movements almost daily, with occasional missed doses leading to a day or two without bowel movements. -Continue Linzess  145mcg daily, stool softeners, and prune tablets. -Provide additional Linzess  samples if available.  Rectal Bleeding Likely due to straining or hemorrhoids, especially after periods of constipation. Not consistent and not associated with other alarming symptoms. Colonoscopy recommended to rule out neoplastic, inflammatory or other causes of bleeding besides benign anorectal causes.  Cirrhosis Clinically stable overall with no new symptoms.  However,. Noted drop in platelet count and slight increase in liver labs, possibly indicating possible progression of cirrhosis and portal hypertension and potential enlargement of varices. -Order abdominal ultrasound to assess for ascites, changes in blood flow, and screen for liver cancer.  She will have a complete abdominal ultrasound with the addition of hepatic and portal venous Dopplers. -Order AFP blood test for liver cancer screening. Upper endoscopy recommended to see if there has been any worsening of her upper digestive varices.  While she is already on therapeutic doses of propranolol , and thus findings on endoscopy may not change that management, I still think it would be helpful for prognostic purposes of bleeding risk as well as possible indicator of cirrhosis progression to know of his current status of the varices. -Consider referral to liver transplant clinic for evaluation if MELD score indicates worsening cirrhosis.  Weight Loss Lost about 35 pounds over the past year and a  half with the help of weight loss medication. -Continue current weight loss medication regimen.  GLP-1 agonist is causing worsening of constipation but she is working  through that with the Linzess .   The benefits and risks of the planned endoscopic procedures were described in detail with the patient or (when appropriate) their health care proxy.  Risks were outlined as including, but not limited to, bleeding, infection, perforation, adverse medication reaction leading to cardiac or pulmonary decompensation, pancreatitis (if ERCP).  The limitation of incomplete mucosal visualization was also discussed.  No guarantees or warranties were given.     40 minutes were spent on this encounter (including chart review, history/exam, counseling/coordination of care, and documentation) > 50% of that time was spent on counseling and coordination of care.   Victory LITTIE Brand III  ________________________  Post clinic addendum  AFP mildly elevated, INR up to 1.6  MELD (using these labs in September 2024 CMP) = 17  Will make following changes to plan:  We have endoscopic procedures to the hospital endoscopy lab due to increased bleeding risks with coagulopathy and thrombocytopenia  Cancel ultrasound and get CT liver with and without contrast  Refer patient [facility]  VEAR Brand MD  04/22/2023

## 2023-04-21 ENCOUNTER — Other Ambulatory Visit: Payer: Self-pay

## 2023-04-22 ENCOUNTER — Other Ambulatory Visit: Payer: Self-pay

## 2023-04-22 DIAGNOSIS — R772 Abnormality of alphafetoprotein: Secondary | ICD-10-CM

## 2023-04-22 DIAGNOSIS — K7460 Unspecified cirrhosis of liver: Secondary | ICD-10-CM

## 2023-04-22 LAB — AFP TUMOR MARKER: AFP-Tumor Marker: 7.5 ng/mL — ABNORMAL HIGH

## 2023-05-03 ENCOUNTER — Other Ambulatory Visit: Payer: Self-pay

## 2023-05-03 ENCOUNTER — Telehealth: Payer: Self-pay

## 2023-05-03 DIAGNOSIS — I8510 Secondary esophageal varices without bleeding: Secondary | ICD-10-CM

## 2023-05-03 DIAGNOSIS — K625 Hemorrhage of anus and rectum: Secondary | ICD-10-CM

## 2023-05-03 DIAGNOSIS — K7460 Unspecified cirrhosis of liver: Secondary | ICD-10-CM

## 2023-05-03 NOTE — Telephone Encounter (Signed)
April hospital schedule now available. Dr. Myrtie Neither has been assigned to 08/02/23. Spoke with patient and she is agreeable to EGD/colonoscopy at [facility] on Tuesday, 08/02/23 at 7:30 am (6 am arrival). Pt has been scheduled for a telephone PV on 07/19/23 at 2 pm. Pt verbalized understanding and had no concerns at the end of the call.

## 2023-05-04 ENCOUNTER — Ambulatory Visit (HOSPITAL_COMMUNITY): Admission: RE | Admit: 2023-05-04 | Payer: 59 | Source: Ambulatory Visit

## 2023-05-10 ENCOUNTER — Encounter: Payer: Self-pay | Admitting: Nurse Practitioner

## 2023-05-10 ENCOUNTER — Ambulatory Visit: Payer: 59 | Admitting: Nurse Practitioner

## 2023-05-10 VITALS — BP 108/68 | HR 86 | Temp 98.3°F | Ht 66.0 in | Wt 214.0 lb

## 2023-05-10 DIAGNOSIS — Z794 Long term (current) use of insulin: Secondary | ICD-10-CM

## 2023-05-10 DIAGNOSIS — E1169 Type 2 diabetes mellitus with other specified complication: Secondary | ICD-10-CM | POA: Diagnosis not present

## 2023-05-10 DIAGNOSIS — E669 Obesity, unspecified: Secondary | ICD-10-CM | POA: Diagnosis not present

## 2023-05-10 DIAGNOSIS — K7581 Nonalcoholic steatohepatitis (NASH): Secondary | ICD-10-CM

## 2023-05-10 DIAGNOSIS — K7460 Unspecified cirrhosis of liver: Secondary | ICD-10-CM | POA: Diagnosis not present

## 2023-05-10 DIAGNOSIS — Z6834 Body mass index (BMI) 34.0-34.9, adult: Secondary | ICD-10-CM

## 2023-05-10 DIAGNOSIS — Z7985 Long-term (current) use of injectable non-insulin antidiabetic drugs: Secondary | ICD-10-CM

## 2023-05-10 NOTE — Progress Notes (Signed)
Office: [phone]  /  Fax: [phone]  WEIGHT SUMMARY AND BIOMETRICS  Weight Lost Since Last Visit: 1lb  Weight Gained Since Last Visit: 0lb   Vitals Temp: 98.3 F (36.8 C) BP: 108/68 Pulse Rate: 86 SpO2: 98 %   Anthropometric Measurements Height: 5\' 6"  (1.676 m) Weight: 214 lb (97.1 kg) BMI (Calculated): 34.56 Weight at Last Visit: 215lb Weight Lost Since Last Visit: 1lb Weight Gained Since Last Visit: 0lb Starting Weight: 230lb Total Weight Loss (lbs): 16 lb (7.258 kg)   Body Composition  Body Fat %: 47 % Fat Mass (lbs): 101 lbs Muscle Mass (lbs): 108 lbs Total Body Water (lbs): 84.2 lbs Visceral Fat Rating : 11   Other Clinical Data Fasting: No Labs: No Today's Visit #: 24 Starting Date: 06/09/21     HPI  Chief Complaint: OBESITY  Laurie Sutton is here to discuss her progress with her obesity treatment plan. She is on the keeping a food journal and adhering to recommended goals of 1800 calories and 100+ protein and states she is following her eating plan approximately 0 % of the time. She states she is exercising 0 minutes 0 days per week.   Interval History:  Since last office visit she has lost 1 pound.  Her mother passed away 03-18-2023. Her father is living with her.  She is scared about her health due to her mother recently passing away from a CVA and her aunt passing away from cirrhosis.  She is eating small portions several times per day.  She eats crackers, chicken-small items-she is overall not eating a lot.  Has increased her sodas since her last visit.  She is also drinking flavored water.  She is not exercising.    She is scheduled for EGD and colonoscopy 08/02/23  Pharmacotherapy for weight loss: She is not currently taking medications  for medical weight loss.    Previous pharmacotherapy for medical weight loss:  Phentermine   Bariatric surgery:   Patient has not had bariatric surgery. Was interested but was not consider a candidate.  If she decides she would like to proceed with surgery, will send to [facility] for further evaluation.     Pharmacotherapy for DMT2:   She is currently taking Humulin breakfast dose is 80 units, lunch 80 units and dinner is 80 units (will take 40 units if she skips a meal). She is taking Mounjaro 10 mg (prescribed and increased by endo).  Takes Zofran PRN for nausea.   Last A1c was done at endo-unsure of results CBGs: 100-200s Episodes of hypoglycemia: no She is not on an ACE or ARB, ASA 81mg  or statin.  Last eye exam:  Sept 24th, 2024 She has tried: Metformin-stopped due to side effects of body aches,  Glipizide-nausea januvia-stopped due to abd pain/cramping,  Ozempic-nausea,  Trulicity-nausea  She saw endo last in November.      Lab Results  Component Value Date   HGBA1C 11.1 09/28/2022   HGBA1C 9.1 (A) 04/08/2022   HGBA1C 9.6 04/27/2019   Lab Results  Component Value Date   MICROALBUR 0.93 04/27/2019   LDLCALC 91 08/19/2017   CREATININE 0.54 03/09/2023    Liver cirrhosis Continue to follow up with GI and keep appts for EGD and colonoscopy.   PHYSICAL EXAM:  Blood pressure 108/68, pulse 86, temperature 98.3 F (36.8 C), height 5\' 6"  (1.676 m), weight 214 lb (97.1 kg), last menstrual period 11/13/2020, SpO2 98%. Body mass index is 34.54 kg/m.  General: She is overweight,  cooperative, alert, well developed, and in no acute distress. PSYCH: Has normal mood, affect and thought process.   Extremities: No edema.  Neurologic: No gross sensory or motor deficits. No tremors or fasciculations noted.    DIAGNOSTIC DATA REVIEWED:  BMET    Component Value Date/Time   NA 133 (L) 03/09/2023 1436   NA 138 08/19/2017 0000   K 3.2 (L) 03/09/2023 1436   CL 100 03/09/2023 1436   CO2 28 03/09/2023 1436   GLUCOSE 364 (H) 03/09/2023 1436   BUN <5 (L) 03/09/2023 1436   BUN 8 08/19/2017 0000   CREATININE 0.54 03/09/2023 1436   CREATININE 0.42 (L) 10/29/2021 0848   CALCIUM 8.6 (L)  03/09/2023 1436   GFRNONAA >60 03/09/2023 1436   GFRAA >60 08/09/2019 0459   Lab Results  Component Value Date   HGBA1C 11.1 09/28/2022   HGBA1C 12.0 (H) 11/12/2013   No results found for: "INSULIN" Lab Results  Component Value Date   TSH 1.170 06/09/2021   CBC    Component Value Date/Time   WBC 2.2 (L) 03/09/2023 1436   WBC 4.0 03/23/2022 0919   RBC 4.31 03/09/2023 1436   HGB 13.0 03/09/2023 1436   HCT 37.7 03/09/2023 1436   PLT 66 (L) 03/09/2023 1436   MCV 87.5 03/09/2023 1436   MCH 30.2 03/09/2023 1436   MCHC 34.5 03/09/2023 1436   RDW 14.9 03/09/2023 1436   Iron Studies No results found for: "IRON", "TIBC", "FERRITIN", "IRONPCTSAT" Lipid Panel     Component Value Date/Time   CHOL 169 08/19/2017 0000   TRIG 202 (A) 08/19/2017 0000   HDL 38 08/19/2017 0000   CHOLHDL 4 05/12/2015 1042   VLDL 37.8 05/12/2015 1042   LDLCALC 91 08/19/2017 0000   Hepatic Function Panel     Component Value Date/Time   PROT 7.0 03/09/2023 1436   ALBUMIN 3.4 (L) 03/09/2023 1436   AST 76 (H) 03/09/2023 1436   ALT 47 (H) 03/09/2023 1436   ALKPHOS 150 (H) 03/09/2023 1436   BILITOT 1.7 (H) 03/09/2023 1436   BILIDIR 0.2 04/27/2019 1223      Component Value Date/Time   TSH 1.170 06/09/2021 0915   Nutritional Lab Results  Component Value Date   VD25OH 34 10/29/2021   VD25OH 12.8 (L) 06/09/2021     ASSESSMENT AND PLAN  TREATMENT PLAN FOR OBESITY:  Recommended Dietary Goals  Ariea is currently in the action stage of change. As such, her goal is to continue weight management plan. She has agreed to to track and will review at next visit-mynetdiary, myfitness pal or lose it.  Behavioral Intervention  We discussed the following Behavioral Modification Strategies today: increasing lean protein intake to established goals, decreasing simple carbohydrates , increasing vegetables, avoiding skipping meals, increasing water intake , work on meal planning and preparation, reading  food labels , planning for success, better snacking choices, and continue to work on maintaining a reduced calorie state, getting the recommended amount of protein, incorporating whole foods, making healthy choices, staying well hydrated and practicing mindfulness when eating..  Additional resources provided today: NA  Recommended Physical Activity Goals  Denice has been advised to work up to 150 minutes of moderate intensity aerobic activity a week and strengthening exercises 2-3 times per week for cardiovascular health, weight loss maintenance and preservation of muscle mass.   She has agreed to Think about enjoyable ways to increase daily physical activity and overcoming barriers to exercise, Increase physical activity in their day and reduce  sedentary time (increase NEAT)., and Work on scheduling and tracking physical activity.  .  ASSOCIATED CONDITIONS ADDRESSED TODAY  Action/Plan  Type 2 diabetes mellitus with other specified complication, with long-term current use of insulin (HCC) Continue to follow up with endo.  Take meds as directed Needs to stop sugary drinks  Good blood sugar control is important to decrease the likelihood of diabetic complications such as nephropathy, neuropathy, limb loss, blindness, coronary artery disease, and death. Intensive lifestyle modification including diet, exercise and weight loss are the first line of treatment for diabetes.    Generalized obesity: Starting BMI 36  BMI 34.0-34.9,adult         Return in about 4 weeks (around 06/07/2023).Marland Kitchen She was informed of the importance of frequent follow up visits to maximize her success with intensive lifestyle modifications for her multiple health conditions.   ATTESTASTION STATEMENTS:  Reviewed by clinician on day of visit: allergies, medications, problem list, medical history, surgical history, family history, social history, and previous encounter notes.   Time spent on visit including pre-visit  chart review and post-visit care and charting was 30 minutes.    Theodis Sato. Dorie Ohms FNP-C

## 2023-05-20 ENCOUNTER — Other Ambulatory Visit: Payer: Self-pay | Admitting: Family Medicine

## 2023-05-23 ENCOUNTER — Other Ambulatory Visit: Payer: Self-pay

## 2023-05-23 MED ORDER — MOUNJARO 10 MG/0.5ML ~~LOC~~ SOAJ
10.0000 mg | SUBCUTANEOUS | 3 refills | Status: DC
  Filled 2023-05-23: qty 2, 28d supply, fill #0

## 2023-05-23 MED ORDER — FREESTYLE LIBRE 3 PLUS SENSOR MISC
5 refills | Status: DC
  Filled 2023-05-23 – 2023-06-15 (×3): qty 2, 30d supply, fill #0

## 2023-05-24 ENCOUNTER — Other Ambulatory Visit: Payer: Self-pay

## 2023-05-24 ENCOUNTER — Ambulatory Visit (HOSPITAL_COMMUNITY): Payer: 59

## 2023-05-25 ENCOUNTER — Other Ambulatory Visit: Payer: Self-pay

## 2023-05-25 MED ORDER — DEXCOM G7 SENSOR MISC
5 refills | Status: DC
  Filled 2023-05-25 – 2023-08-03 (×3): qty 3, 30d supply, fill #0
  Filled 2023-08-26 – 2023-10-18 (×2): qty 3, 30d supply, fill #1

## 2023-05-30 ENCOUNTER — Other Ambulatory Visit: Payer: Self-pay

## 2023-05-31 ENCOUNTER — Encounter: Payer: Self-pay | Admitting: Nurse Practitioner

## 2023-05-31 ENCOUNTER — Ambulatory Visit: Payer: 59 | Admitting: Nurse Practitioner

## 2023-05-31 VITALS — BP 104/66 | HR 74 | Temp 98.1°F | Ht 66.0 in | Wt 216.0 lb

## 2023-05-31 DIAGNOSIS — Z794 Long term (current) use of insulin: Secondary | ICD-10-CM

## 2023-05-31 DIAGNOSIS — Z7985 Long-term (current) use of injectable non-insulin antidiabetic drugs: Secondary | ICD-10-CM

## 2023-05-31 DIAGNOSIS — E66811 Obesity, class 1: Secondary | ICD-10-CM

## 2023-05-31 DIAGNOSIS — K7581 Nonalcoholic steatohepatitis (NASH): Secondary | ICD-10-CM

## 2023-05-31 DIAGNOSIS — K7460 Unspecified cirrhosis of liver: Secondary | ICD-10-CM | POA: Diagnosis not present

## 2023-05-31 DIAGNOSIS — E1169 Type 2 diabetes mellitus with other specified complication: Secondary | ICD-10-CM

## 2023-05-31 DIAGNOSIS — Z6834 Body mass index (BMI) 34.0-34.9, adult: Secondary | ICD-10-CM

## 2023-05-31 NOTE — Progress Notes (Signed)
 Office: [phone]  /  Fax: [phone]  WEIGHT SUMMARY AND BIOMETRICS  Weight Lost Since Last Visit: 0lb  Weight Gained Since Last Visit: 2lb   Vitals Temp: 98.1 F (36.7 C) BP: 104/66 Pulse Rate: 74 SpO2: 98 %   Anthropometric Measurements Height: 5\' 6"  (1.676 m) Weight: 216 lb (98 kg) BMI (Calculated): 34.88 Weight at Last Visit: 214lb Weight Lost Since Last Visit: 0lb Weight Gained Since Last Visit: 2lb Starting Weight: 230lb Total Weight Loss (lbs): 14 lb (6.35 kg)   Body Composition  Body Fat %: 46 % Fat Mass (lbs): 99.6 lbs Muscle Mass (lbs): 110.8 lbs Total Body Water (lbs): 82.6 lbs Visceral Fat Rating : 11   Other Clinical Data Fasting: No Labs: No Today's Visit #: 25 Starting Date: 06/09/21     HPI  Chief Complaint: OBESITY  Jmya is here to discuss her progress with her obesity treatment plan. She is on the keeping a food journal and adhering to recommended goals of 1800 calories and 100+ protein and states she is following her eating plan approximately 50 % of the time. She states she is exercising 5-15 minutes 5 days per week.   Interval History:  Since last office visit she has gained 2 pounds. She is using mynetdiary to track.  She is averaging around [id number] calories, 22-140 grams of protein, 20-130 fat and 128-228 carbs.  She is trying to eat 3 meals per day.  She is not skipping as many meals as she has in the past.  She is drinking water and 1 soda per day (decreased from 3-4 sodas per day).    Pharmacotherapy for weight loss: She is not currently taking medications  for medical weight loss.    Previous pharmacotherapy for medical weight loss:  Phentermine   Bariatric surgery:   Patient has not had bariatric surgery. Was interested but was not consider a candidate. If she decides she would like to proceed with surgery, will send to [facility] for further evaluation.   Pharmacotherapy for DMT2:   She is currently taking Humulin  breakfast dose is 80 units breakfast, lunch 65 units and dinner is 65 units (will take 40 units if she skips a meal). She is taking Mounjaro 10 mg (prescribed by endo).  Takes Zofran PRN for nausea.  She saw endo last 1-2 weeks ago.  Last A1c was at endo was 8.4 last week.  CBGs: 174-259/average 222 Episodes of hypoglycemia: 3 episodes and has discussed with endo.  Insulin was adjusted. She is not on an ACE or ARB, ASA 81mg  or statin.  Last eye exam:  Sept 24th, 2024 She has tried: -Metformin-stopped due to side effects of body aches,  -Glipizide-nausea -januvia-stopped due to abd pain/cramping,  -Ozempic-nausea,  -Trulicity-nausea    Liver cirrhosis secondary to NASH.  Saw GI last on 04/20/23.  Is scheduled for CT next week and EGD/colonoscopy 08/02/23  Lab Results  Component Value Date   HGBA1C 11.1 09/28/2022   HGBA1C 9.1 (A) 04/08/2022   HGBA1C 9.6 04/27/2019   Lab Results  Component Value Date   MICROALBUR 0.93 04/27/2019   LDLCALC 91 08/19/2017   CREATININE 0.54 03/09/2023           PHYSICAL EXAM:  Blood pressure 104/66, pulse 74, temperature 98.1 F (36.7 C), height 5\' 6"  (1.676 m), weight 216 lb (98 kg), last menstrual period 11/13/2020, SpO2 98%. Body mass index is 34.86 kg/m.  General: She is overweight, cooperative, alert, well developed, and in no acute  distress. PSYCH: Has normal mood, affect and thought process.   Extremities: No edema.  Neurologic: No gross sensory or motor deficits. No tremors or fasciculations noted.    DIAGNOSTIC DATA REVIEWED:  BMET    Component Value Date/Time   NA 133 (L) 03/09/2023 1436   NA 138 08/19/2017 0000   K 3.2 (L) 03/09/2023 1436   CL 100 03/09/2023 1436   CO2 28 03/09/2023 1436   GLUCOSE 364 (H) 03/09/2023 1436   BUN <5 (L) 03/09/2023 1436   BUN 8 08/19/2017 0000   CREATININE 0.54 03/09/2023 1436   CREATININE 0.42 (L) 10/29/2021 0848   CALCIUM 8.6 (L) 03/09/2023 1436   GFRNONAA >60 03/09/2023 1436   GFRAA >60  08/09/2019 0459   Lab Results  Component Value Date   HGBA1C 11.1 09/28/2022   HGBA1C 12.0 (H) 11/12/2013   No results found for: "INSULIN" Lab Results  Component Value Date   TSH 1.170 06/09/2021   CBC    Component Value Date/Time   WBC 2.2 (L) 03/09/2023 1436   WBC 4.0 03/23/2022 0919   RBC 4.31 03/09/2023 1436   HGB 13.0 03/09/2023 1436   HCT 37.7 03/09/2023 1436   PLT 66 (L) 03/09/2023 1436   MCV 87.5 03/09/2023 1436   MCH 30.2 03/09/2023 1436   MCHC 34.5 03/09/2023 1436   RDW 14.9 03/09/2023 1436   Iron Studies No results found for: "IRON", "TIBC", "FERRITIN", "IRONPCTSAT" Lipid Panel     Component Value Date/Time   CHOL 169 08/19/2017 0000   TRIG 202 (A) 08/19/2017 0000   HDL 38 08/19/2017 0000   CHOLHDL 4 05/12/2015 1042   VLDL 37.8 05/12/2015 1042   LDLCALC 91 08/19/2017 0000   Hepatic Function Panel     Component Value Date/Time   PROT 7.0 03/09/2023 1436   ALBUMIN 3.4 (L) 03/09/2023 1436   AST 76 (H) 03/09/2023 1436   ALT 47 (H) 03/09/2023 1436   ALKPHOS 150 (H) 03/09/2023 1436   BILITOT 1.7 (H) 03/09/2023 1436   BILIDIR 0.2 04/27/2019 1223      Component Value Date/Time   TSH 1.170 06/09/2021 0915   Nutritional Lab Results  Component Value Date   VD25OH 34 10/29/2021   VD25OH 12.8 (L) 06/09/2021     ASSESSMENT AND PLAN  TREATMENT PLAN FOR OBESITY:  Recommended Dietary Goals  Symiah is currently in the action stage of change. As such, her goal is to continue weight management plan. She has agreed to keeping a food journal and adhering to recommended goals of 1800 calories and 110+ grams of  protein. Discussed reduction in carbs intake.  Will review at next visit.    Behavioral Intervention  We discussed the following Behavioral Modification Strategies today: increasing lean protein intake to established goals, decreasing simple carbohydrates , increasing vegetables, increasing lower glycemic fruits, increasing fiber rich foods, work on  tracking and journaling calories using tracking application, identifying sources and decreasing liquid calories, continue to practice mindfulness when eating, planning for success, and continue to work on maintaining a reduced calorie state, getting the recommended amount of protein, incorporating whole foods, making healthy choices, staying well hydrated and practicing mindfulness when eating..  Additional resources provided today: NA  Recommended Physical Activity Goals  Jacoya has been advised to work up to 150 minutes of moderate intensity aerobic activity a week and strengthening exercises 2-3 times per week for cardiovascular health, weight loss maintenance and preservation of muscle mass.   She has agreed to Think about enjoyable  ways to increase daily physical activity and overcoming barriers to exercise, Increase physical activity in their day and reduce sedentary time (increase NEAT)., Increase the intensity, frequency or duration of strengthening exercises , and Increase the intensity, frequency or duration of aerobic exercises     ASSOCIATED CONDITIONS ADDRESSED TODAY  Action/Plan  Type 2 diabetes mellitus with other specified complication, with long-term current use of insulin (HCC) -     Amb ref to Medical Nutrition Therapy-MNT  Liver cirrhosis secondary to NASH (nonalcoholic steatohepatitis) (HCC) -     Amb ref to Medical Nutrition Therapy-MNT  Class 1 obesity due to excess calories with serious comorbidity and body mass index (BMI) of 34.0 to 34.9 in adult -     Amb ref to Medical Nutrition Therapy-MNT     She is scheduled for EGD and colonoscopy on 08/02/23 and CT scan on 06/06/23.   Will have her see Dr. Cathey Endow at her next visit.  Patient's medical history discussed with Dr. Cathey Endow.    Spent 40 minutes with the patient and reviewing her chart before and after her visit.   Return in about 4 weeks (around 06/28/2023).Marland Kitchen She was informed of the importance of frequent follow up  visits to maximize her success with intensive lifestyle modifications for her multiple health conditions.   ATTESTASTION STATEMENTS:  Reviewed by clinician on day of visit: allergies, medications, problem list, medical history, surgical history, family history, social history, and previous encounter notes.     Theodis Sato. Asani Deniston FNP-C

## 2023-06-06 ENCOUNTER — Ambulatory Visit (HOSPITAL_COMMUNITY)
Admission: RE | Admit: 2023-06-06 | Discharge: 2023-06-06 | Disposition: A | Discharge status: Home / Self Care | Payer: 59 | Source: Ambulatory Visit | Attending: Gastroenterology | Admitting: Gastroenterology

## 2023-06-06 DIAGNOSIS — K7460 Unspecified cirrhosis of liver: Secondary | ICD-10-CM | POA: Insufficient documentation

## 2023-06-06 DIAGNOSIS — R772 Abnormality of alphafetoprotein: Secondary | ICD-10-CM | POA: Insufficient documentation

## 2023-06-06 DIAGNOSIS — K7581 Nonalcoholic steatohepatitis (NASH): Secondary | ICD-10-CM | POA: Insufficient documentation

## 2023-06-06 DIAGNOSIS — Z794 Long term (current) use of insulin: Secondary | ICD-10-CM | POA: Insufficient documentation

## 2023-06-06 DIAGNOSIS — E1169 Type 2 diabetes mellitus with other specified complication: Secondary | ICD-10-CM | POA: Insufficient documentation

## 2023-06-06 LAB — POCT I-STAT CREATININE: Creatinine, Ser: 0.6 mg/dL (ref 0.44–1.00)

## 2023-06-06 MED ORDER — IOHEXOL 300 MG/ML  SOLN
100.0000 mL | Freq: Once | INTRAMUSCULAR | Status: AC | PRN
  Administered 2023-06-06: 100 mL via INTRAVENOUS

## 2023-06-07 ENCOUNTER — Ambulatory Visit: Payer: 59 | Admitting: Family Medicine

## 2023-06-08 ENCOUNTER — Other Ambulatory Visit: Payer: Self-pay

## 2023-06-14 ENCOUNTER — Ambulatory Visit (INDEPENDENT_AMBULATORY_CARE_PROVIDER_SITE_OTHER): Payer: 59 | Admitting: Family Medicine

## 2023-06-14 ENCOUNTER — Encounter: Payer: Self-pay | Admitting: Family Medicine

## 2023-06-14 VITALS — BP 102/60 | HR 78 | Temp 97.8°F | Wt 225.0 lb

## 2023-06-14 DIAGNOSIS — K7581 Nonalcoholic steatohepatitis (NASH): Secondary | ICD-10-CM | POA: Diagnosis not present

## 2023-06-14 DIAGNOSIS — K7460 Unspecified cirrhosis of liver: Secondary | ICD-10-CM

## 2023-06-14 NOTE — Progress Notes (Signed)
   Subjective:    Patient ID: Laurie Sutton, female    DOB: [birth year], 43 y.o.   MRN: [id number]  HPI Here with questions about recent test results. She sees Dr. Myrtie Neither for NASH, and this appears to be progressing. Recent labs show that her ALF is mildly elevated at 7.5, and this has doubled in the past 2 years. She feels bloating and distension in her abdomen, and she has mild swelling in her ankles. No SOB. She had a CT scan of her abdomen on 06-06-23, but the radiology report is not available yet. She is scheduled for a colonoscopy and upper endoscopy with Dr. Myrtie Neither on 07-13-23. She has also been referred to see someone in the [facility] clinic sometime soon.    Review of Systems  Constitutional: Negative.   Respiratory: Negative.    Cardiovascular: Negative.   Gastrointestinal:  Positive for abdominal distention. Negative for abdominal pain, blood in stool, constipation, diarrhea, nausea and vomiting.       Objective:   Physical Exam Constitutional:      Appearance: Normal appearance.  Cardiovascular:     Rate and Rhythm: Normal rate and regular rhythm.     Pulses: Normal pulses.     Heart sounds: Normal heart sounds.  Pulmonary:     Effort: Pulmonary effort is normal.     Breath sounds: Normal breath sounds.  Abdominal:     General: There is distension.     Palpations: There is no mass.     Tenderness: There is no abdominal tenderness. There is no guarding or rebound.     Hernia: No hernia is present.  Musculoskeletal:     Comments: 1+ edema in both ankles   Neurological:     Mental Status: She is alert.           Assessment & Plan:  Her NASH is progressing, and we await the results of her recent CT scan. She has upper and lower endoscopies pending with Dr. Myrtie Neither. She will also see [facility] soon.  Gershon Crane, MD

## 2023-06-15 ENCOUNTER — Other Ambulatory Visit: Payer: Self-pay

## 2023-06-15 ENCOUNTER — Encounter: Payer: Self-pay | Admitting: Gastroenterology

## 2023-06-15 ENCOUNTER — Encounter: Payer: 59 | Admitting: Gastroenterology

## 2023-06-16 ENCOUNTER — Other Ambulatory Visit: Payer: Self-pay

## 2023-06-20 ENCOUNTER — Ambulatory Visit (INDEPENDENT_AMBULATORY_CARE_PROVIDER_SITE_OTHER): Payer: 59 | Admitting: Podiatry

## 2023-06-20 ENCOUNTER — Encounter: Payer: Self-pay | Admitting: Podiatry

## 2023-06-20 DIAGNOSIS — M722 Plantar fascial fibromatosis: Secondary | ICD-10-CM

## 2023-06-20 DIAGNOSIS — E119 Type 2 diabetes mellitus without complications: Secondary | ICD-10-CM | POA: Diagnosis not present

## 2023-06-20 MED ORDER — BETAMETHASONE SOD PHOS & ACET 6 (3-3) MG/ML IJ SUSP
3.0000 mg | Freq: Once | INTRAMUSCULAR | Status: AC
  Administered 2023-06-20: 3 mg via INTRA_ARTICULAR

## 2023-06-20 MED ORDER — BETAMETHASONE SOD PHOS & ACET 6 (3-3) MG/ML IJ SUSP: 3.0000 mg | Freq: Once | INTRAMUSCULAR | Status: DC

## 2023-06-20 NOTE — Progress Notes (Signed)
 Chief Complaint  Patient presents with   Plantar Fasciitis    Patient states she is having the same issues with her left foot     Subjective: 43 y.o. female PMHx T2DM, last A1c 09/28/2022 was 11.1, presenting today for evaluation of left heel pain.  Patient has a long history of plantar fasciitis bilateral.  She ultimately had endoscopic plantar fasciotomy to the right foot a few years prior.  She says the right foot is doing very well.  She wears her orthotics but she continues to have pain and tenderness to her feet.   Past Medical History:  Diagnosis Date   ADHD    Anemia    IRON TRANSFUSION 07-2020 NONE SINCE   Anxiety    Back pain    COVID 08/12/2019   ALL SYMPTOMS REOLVED PER PT   Depression    Diabetic neuropathy (HCC) 11/11/2020   FEET   dm type 2    sees Dr. Ocie Cornfield at [facility]   Fatty liver    GERD (gastroesophageal reflux disease)    History of kidney stones    Joint pain    Menorrhagia 11/11/2020   Migraines    Murmur, cardiac    FAINT NO CARDIOLOGIST   Other fatigue    Pneumonia 08/12/2019   COVID PNEUMONIA ALL SYMPTOMS RESOLVED PER PT   Shortness of breath on exertion      Objective: Physical Exam General: The patient is alert and oriented x3 in no acute distress.  Dermatology: Skin is warm, dry and supple bilateral lower extremities. Negative for open lesions or macerations bilateral.   Vascular: Dorsalis Pedis and Posterior Tibial pulses palpable bilateral.  Capillary fill time is immediate to all digits.  Neurological: Grossly intact via light touch  Musculoskeletal: There continues to be tenderness to palpation to the plantar aspect of the left heel along the plantar fascia. All other joints range of motion within normal limits bilateral. Strength 5/5 in all groups bilateral.   Radiographic exam LT foot 02/21/2023: Normal osseous mineralization. Joint spaces preserved. No fracture/dislocation/boney destruction. No other soft  tissue abnormalities or radiopaque foreign bodies.  Plantar and posterior heel spurs noted on lateral view  Assessment: 1.  Chronic plantar fasciitis left foot 2. H/o endoscopic plantar fasciotomy right with partial permanent nail avulsion lateral border right great toe. DOS: 10/02/2020  3.  Encounter for diabetic foot exam  Plan of Care:  -Patient evaluated.  Comprehensive diabetic foot exam performed today -Injection of 0.5cc Celestone soluspan injected into the left plantar fascia.  - Continue meloxicam 15 mg ordered for patient. -Continue custom molded orthotics - Unfortunately the patient continues to have pain and tenderness associated to the plantar fascia left.  She has tried multiple conservative modalities.  She is doing very well to the right heel and she no longer has the pain that she was experiencing prior to surgery for the right plantar fasciitis.  She would like to discuss surgery today for the left plantar fasciitis -Risk benefits advantages disadvantages the procedure were explained in detail with the patient.  No guarantees were expressed or implied.  After discussion with the patient I do believe that endoscopic plantar fasciotomy of the plantar fascia to the left foot is appropriate at this time since she has failed conservative treatment. -Authorization for surgery was initiated today.  Surgery will consist of endoscopic plantar fasciotomy left -Prior to surgery the patient will need clearance from her PCP.  Specifically to ensure that her diabetes is  under better control -Return to clinic 1 week postop  *Works in [employer] at [employer]. Daughter, Ava, in 7th grade at [employer].   Felecia Shelling, DPM [facility]  Dr. Felecia Shelling, DPM    [address] [place], [place] [address]                Office [phone]  Fax [phone]

## 2023-06-21 ENCOUNTER — Emergency Department (HOSPITAL_BASED_OUTPATIENT_CLINIC_OR_DEPARTMENT_OTHER)
Admission: EM | Admit: 2023-06-21 | Discharge: 2023-06-22 | Disposition: A | Discharge status: Home / Self Care | Attending: Emergency Medicine | Admitting: Emergency Medicine

## 2023-06-21 ENCOUNTER — Other Ambulatory Visit: Payer: Self-pay

## 2023-06-21 ENCOUNTER — Encounter (HOSPITAL_BASED_OUTPATIENT_CLINIC_OR_DEPARTMENT_OTHER): Payer: Self-pay | Admitting: Emergency Medicine

## 2023-06-21 DIAGNOSIS — Z91040 Latex allergy status: Secondary | ICD-10-CM | POA: Insufficient documentation

## 2023-06-21 DIAGNOSIS — R202 Paresthesia of skin: Secondary | ICD-10-CM | POA: Diagnosis not present

## 2023-06-21 DIAGNOSIS — E873 Alkalosis: Secondary | ICD-10-CM | POA: Insufficient documentation

## 2023-06-21 DIAGNOSIS — R739 Hyperglycemia, unspecified: Secondary | ICD-10-CM | POA: Insufficient documentation

## 2023-06-21 DIAGNOSIS — H538 Other visual disturbances: Secondary | ICD-10-CM | POA: Diagnosis not present

## 2023-06-21 LAB — I-STAT VENOUS BLOOD GAS, ED
Acid-Base Excess: 1 mmol/L (ref 0.0–2.0)
Acid-Base Excess: 1 mmol/L (ref 0.0–2.0)
Bicarbonate: 20.9 mmol/L (ref 20.0–28.0)
Bicarbonate: 21.1 mmol/L (ref 20.0–28.0)
Calcium, Ion: 1.13 mmol/L — ABNORMAL LOW (ref 1.15–1.40)
Calcium, Ion: 1.15 mmol/L (ref 1.15–1.40)
HCT: 36 % (ref 36.0–46.0)
HCT: 36 % (ref 36.0–46.0)
Hemoglobin: 12.2 g/dL (ref 12.0–15.0)
Hemoglobin: 12.2 g/dL (ref 12.0–15.0)
O2 Saturation: 56 %
O2 Saturation: 59 %
Potassium: 3.6 mmol/L (ref 3.5–5.1)
Potassium: 3.6 mmol/L (ref 3.5–5.1)
Sodium: 137 mmol/L (ref 135–145)
Sodium: 137 mmol/L (ref 135–145)
TCO2: 22 mmol/L (ref 22–32)
TCO2: 22 mmol/L (ref 22–32)
pCO2, Ven: 21.2 mmHg — ABNORMAL LOW (ref 44–60)
pCO2, Ven: 21.5 mmHg — ABNORMAL LOW (ref 44–60)
pH, Ven: 7.6 — ABNORMAL HIGH (ref 7.25–7.43)
pH, Ven: 7.602 (ref 7.25–7.43)
pO2, Ven: 23 mmHg — CL (ref 32–45)
pO2, Ven: 24 mmHg — CL (ref 32–45)

## 2023-06-21 LAB — COMPREHENSIVE METABOLIC PANEL
ALT: 31 U/L (ref 0–44)
AST: 46 U/L — ABNORMAL HIGH (ref 15–41)
Albumin: 3.3 g/dL — ABNORMAL LOW (ref 3.5–5.0)
Alkaline Phosphatase: 156 U/L — ABNORMAL HIGH (ref 38–126)
Anion gap: 9 (ref 5–15)
BUN: 5 mg/dL — ABNORMAL LOW (ref 6–20)
CO2: 22 mmol/L (ref 22–32)
Calcium: 8.4 mg/dL — ABNORMAL LOW (ref 8.9–10.3)
Chloride: 102 mmol/L (ref 98–111)
Creatinine, Ser: 0.55 mg/dL (ref 0.44–1.00)
GFR, Estimated: >60 mL/min (ref >60)
Glucose, Bld: 367 mg/dL — ABNORMAL HIGH (ref 70–99)
Potassium: 3.6 mmol/L (ref 3.5–5.1)
Sodium: 133 mmol/L — ABNORMAL LOW (ref 135–145)
Total Bilirubin: 1.8 mg/dL — ABNORMAL HIGH (ref 0.0–1.2)
Total Protein: 7.2 g/dL (ref 6.5–8.1)

## 2023-06-21 LAB — CBC
HCT: 34.8 % — ABNORMAL LOW (ref 36.0–46.0)
Hemoglobin: 12.2 g/dL (ref 12.0–15.0)
MCH: 31 pg (ref 26.0–34.0)
MCHC: 35.1 g/dL (ref 30.0–36.0)
MCV: 88.3 fL (ref 80.0–100.0)
Platelets: 68 10*3/uL — ABNORMAL LOW (ref 150–400)
RBC: 3.94 MIL/uL (ref 3.87–5.11)
RDW: 15.6 % — ABNORMAL HIGH (ref 11.5–15.5)
WBC: 3.7 10*3/uL — ABNORMAL LOW (ref 4.0–10.5)
nRBC: 0 % (ref 0.0–0.2)

## 2023-06-21 LAB — CBG MONITORING, ED
Glucose-Capillary: 360 mg/dL — ABNORMAL HIGH (ref 70–99)
Glucose-Capillary: 336 mg/dL — ABNORMAL HIGH (ref 70–99)

## 2023-06-21 MED ORDER — LACTATED RINGERS IV BOLUS
1000.0000 mL | Freq: Once | INTRAVENOUS | Status: AC
Start: 2023-06-21 — End: 2023-06-22
  Administered 2023-06-21: 1000 mL via INTRAVENOUS

## 2023-06-21 MED ORDER — LACTATED RINGERS IV BOLUS
1000.0000 mL | Freq: Once | INTRAVENOUS | Status: AC
  Administered 2023-06-22: 1000 mL via INTRAVENOUS

## 2023-06-21 NOTE — ED Triage Notes (Signed)
 Patient c/o hyperglycemia, CBG over 500 x 3 hours.  Patient had cortisone shot yesterday.  Patient has been trying to control it with home insulin.  Patient also endorses blurred vision x 1 hour, headache, and "heart racing."

## 2023-06-21 NOTE — ED Provider Notes (Signed)
 [place] EMERGENCY DEPARTMENT AT [facility] Provider Note   CSN: [id number] Arrival date & time: 06/21/23  2115     History {Add pertinent medical, surgical, social history, OB history to HPI:1} Chief Complaint  Patient presents with   Hyperglycemia   Blurred Vision    Laurie Sutton is a 43 y.o. female.   Hyperglycemia      Home Medications Prior to Admission medications   Medication Sig Start Date End Date Taking? Authorizing Provider  albuterol (VENTOLIN HFA) 108 (90 Base) MCG/ACT inhaler INHALE 2 PUFFS INTO THE LUNGS UP TO EVERY 6 HOURS AS NEEDED FOR WHEEZING/SHORTNESS OF BREATH 08/20/22   Nelwyn Salisbury, MD  clotrimazole-betamethasone (LOTRISONE) cream APPLY 1 APPLICATION TOPICALLY TWICE A DAY AS NEEDED 05/26/23   Nelwyn Salisbury, MD  Continuous Blood Gluc Sensor (FREESTYLE LIBRE 14 DAY SENSOR) MISC Apply topically every 14 (fourteen) days. 12/26/19   [provider]  Continuous Glucose Sensor (DEXCOM G7 SENSOR) MISC Use 1 sensor for continuous glucose monitoring every 10 days for 30 days 05/25/23   Ocie Cornfield, MD  Continuous Glucose Sensor (FREESTYLE LIBRE 3 PLUS SENSOR) MISC Change every 15 days. 05/23/23     Continuous Glucose Sensor (FREESTYLE LIBRE 3 SENSOR) MISC Apply sensor to upper back of arm, change sensor every 14 days. 12/30/22   Ocie Cornfield, MD  Cranberry 125 MG TABS Take by mouth 2 (two) times daily.    [provider]  doxycycline (VIBRAMYCIN) 100 MG capsule Take 100 mg by mouth 2 (two) times daily. 03/24/23   [provider]  FLUoxetine (PROZAC) 40 MG capsule TAKE 1 CAPSULE BY MOUTH TWICE A DAY 03/16/23   Nelwyn Salisbury, MD  gabapentin (NEURONTIN) 100 MG capsule Take 1 capsule (100 mg total) by mouth 2 (two) times daily. 11/08/22   Nelwyn Salisbury, MD  HUMULIN R U-500 KWIKPEN 500 UNIT/ML KwikPen Use 120 units at breakfast, 100 at lunch, and 100 at dinner Patient taking differently: Use 60 units at breakfast, 65 at lunch,  and 65 at dinner 04/08/22   Nelwyn Salisbury, MD  LINZESS 145 MCG CAPS capsule TAKE 1 CAPSULE BY MOUTH DAILY BEFORE BREAKFAST. 03/17/23   Sherrilyn Rist, MD  LORazepam (ATIVAN) 0.5 MG tablet Take 1 tablet (0.5 mg total) by mouth 2 (two) times daily as needed for anxiety. 12/29/22   Nelwyn Salisbury, MD  montelukast (SINGULAIR) 10 MG tablet Take 1 tablet (10 mg total) by mouth at bedtime. 08/25/22   Nelwyn Salisbury, MD  nortriptyline (PAMELOR) 10 MG capsule Take 3 capsules (30 mg total) by mouth at bedtime. 02/23/23   Glean Salvo, NP  omeprazole (PRILOSEC) 40 MG capsule TAKE 1 CAPSULE (40 MG TOTAL) BY MOUTH IN THE MORNING 03/17/23   Danis, Andreas Blower, MD  ondansetron (ZOFRAN-ODT) 4 MG disintegrating tablet Take 1 tablet (4 mg total) by mouth every 8 (eight) hours as needed for nausea or vomiting. 02/09/23   Irene Limbo, FNP  propranolol (INDERAL) 60 MG tablet Take 1 tablet (60 mg total) by mouth 2 (two) times daily. 02/23/23   Glean Salvo, NP  Sodium Sulfate-Mag Sulfate-KCl (SUTAB) 930 003 1330 MG TABS Use as directed for colonoscopy. MANUFACTURER CODES!! BIN: [id number] PCN: CN GROUP: [id number] MEMBER ID: [id number];[employer] AS SECONDARY INSURANCE ;NO PRIOR AUTHORIZATION 04/20/23   Danis, Andreas Blower, MD  tirzepatide [facility]) 10 MG/0.5ML Pen Inject 10 mg into the skin once a week. 02/16/23     tirzepatide (MOUNJARO) 10 MG/0.5ML  Pen Inject 10 mg into the skin once a week. 05/23/23         Allergies    Amoxicillin, Azithromycin, Dulaglutide, Empagliflozin-metformin hcl er, Januvia [sitagliptin], Latex, Metformin, and Penicillins    Review of Systems   Review of Systems  Physical Exam Updated Vital Signs BP (!) 125/59   Pulse 98   Resp 18   Wt 97.5 kg   LMP 11/13/2020 (Exact Date)   SpO2 96%   BMI 34.70 kg/m  Physical Exam  ED Results / Procedures / Treatments   Labs (all labs ordered are listed, but only abnormal results are displayed) Labs Reviewed  CBC - Abnormal; Notable for the  following components:      Result Value   WBC 3.7 (*)    HCT 34.8 (*)    RDW 15.6 (*)    Platelets 68 (*)    All other components within normal limits  COMPREHENSIVE METABOLIC PANEL - Abnormal; Notable for the following components:   Sodium 133 (*)    Glucose, Bld 367 (*)    BUN 5 (*)    Calcium 8.4 (*)    Albumin 3.3 (*)    AST 46 (*)    Alkaline Phosphatase 156 (*)    Total Bilirubin 1.8 (*)    All other components within normal limits  CBG MONITORING, ED - Abnormal; Notable for the following components:   Glucose-Capillary 360 (*)    All other components within normal limits  CBG MONITORING, ED - Abnormal; Notable for the following components:   Glucose-Capillary 336 (*)    All other components within normal limits  I-STAT VENOUS BLOOD GAS, ED - Abnormal; Notable for the following components:   pH, Ven 7.602 (*)    pCO2, Ven 21.2 (*)    pO2, Ven 23 (*)    All other components within normal limits  I-STAT VENOUS BLOOD GAS, ED - Abnormal; Notable for the following components:   pH, Ven 7.600 (*)    pCO2, Ven 21.5 (*)    pO2, Ven 24 (*)    Calcium, Ion 1.13 (*)    All other components within normal limits  URINALYSIS, ROUTINE W REFLEX MICROSCOPIC    EKG None  Radiology No results found.  Procedures Procedures  {Document cardiac monitor, telemetry assessment procedure when appropriate:1}  Medications Ordered in ED Medications  lactated ringers bolus 1,000 mL (1,000 mLs Intravenous New Bag/Given 06/21/23 2257)    ED Course/ Medical Decision Making/ A&P   {   Click here for ABCD2, HEART and other calculatorsREFRESH Note before signing :1}                              Medical Decision Making Amount and/or Complexity of Data Reviewed Labs: ordered.   ***  {Document critical care time when appropriate:1} {Document review of labs and clinical decision tools ie heart score, Chads2Vasc2 etc:1}  {Document your independent review of radiology images, and any  outside records:1} {Document your discussion with family members, caretakers, and with consultants:1} {Document social determinants of health affecting pt's care:1} {Document your decision making why or why not admission, treatments were needed:1} Final Clinical Impression(s) / ED Diagnoses Final diagnoses:  None    Rx / DC Orders ED Discharge Orders     None

## 2023-06-21 NOTE — ED Notes (Signed)
 Repeated VBG to verify results. Results remained the same. EDP aware.

## 2023-06-21 NOTE — ED Provider Triage Note (Signed)
 Emergency Medicine Provider Triage Evaluation Note  Laurie Sutton , a 43 y.o. female  was evaluated in triage.  Pt complains of hyperglycemia and blurred vision.  Patient reportedly received a cortisone injection yesterday and has had glucose levels above 500 for the last 3 hours.  She states that she is also having a headache and some blurry vision which typically occurs with high elevations in her blood sugar.  She currently takes insulin and states that she has not been able to control her sugar at home.  Denies any nausea, vomiting, or abdominal pain..  Review of Systems  Positive: As above Negative: As above  Physical Exam  BP 134/62   Pulse (!) 105   Resp 18   Wt 97.5 kg   LMP 11/13/2020 (Exact Date)   SpO2 100%   BMI 34.70 kg/m  Gen:   Awake, no distress   Resp:  Normal effort  MSK:   Moves extremities without difficulty  Other:  No acute neurological deficits seen.  No facial droop, asymmetry, or unilateral weakness or numbness.  Cranial nerves II through XII intact.  Medical Decision Making  Medically screening exam initiated at 9:31 PM.  Appropriate orders placed.  Laurie Sutton was informed that the remainder of the evaluation will be completed by another provider, this initial triage assessment does not replace that evaluation, and the importance of remaining in the ED until their evaluation is complete.     Smitty Knudsen, PA-C 06/21/23 2132

## 2023-06-21 NOTE — ED Notes (Signed)
 I-Stat VBG results given to Dr. Anitra Lauth. No further RT orders at this time.

## 2023-06-21 NOTE — ED Notes (Signed)
PA in triage.

## 2023-06-22 LAB — URINALYSIS, ROUTINE W REFLEX MICROSCOPIC
Bacteria, UA: NONE SEEN
Bilirubin Urine: NEGATIVE
Glucose, UA: >1000 mg/dL — AB
Hgb urine dipstick: NEGATIVE
Ketones, ur: NEGATIVE mg/dL
Leukocytes,Ua: NEGATIVE
Nitrite: NEGATIVE
Protein, ur: NEGATIVE mg/dL
Specific Gravity, Urine: 1.03 (ref 1.005–1.030)
pH: 7 (ref 5.0–8.0)

## 2023-06-22 LAB — I-STAT VENOUS BLOOD GAS, ED
Acid-Base Excess: 1 mmol/L (ref 0.0–2.0)
Bicarbonate: 23.8 mmol/L (ref 20.0–28.0)
Calcium, Ion: 1.16 mmol/L (ref 1.15–1.40)
HCT: 31 % — ABNORMAL LOW (ref 36.0–46.0)
Hemoglobin: 10.5 g/dL — ABNORMAL LOW (ref 12.0–15.0)
O2 Saturation: 91 %
Potassium: 3.3 mmol/L — ABNORMAL LOW (ref 3.5–5.1)
Sodium: 140 mmol/L (ref 135–145)
TCO2: 25 mmol/L (ref 22–32)
pCO2, Ven: 32 mmHg — ABNORMAL LOW (ref 44–60)
pH, Ven: 7.479 — ABNORMAL HIGH (ref 7.25–7.43)
pO2, Ven: 55 mmHg — ABNORMAL HIGH (ref 32–45)

## 2023-06-22 LAB — CBG MONITORING, ED
Glucose-Capillary: 258 mg/dL — ABNORMAL HIGH (ref 70–99)
Glucose-Capillary: 295 mg/dL — ABNORMAL HIGH (ref 70–99)
Glucose-Capillary: 319 mg/dL — ABNORMAL HIGH (ref 70–99)

## 2023-06-22 MED ORDER — LACTATED RINGERS IV BOLUS
500.0000 mL | Freq: Once | INTRAVENOUS | Status: AC
  Administered 2023-06-22: 500 mL via INTRAVENOUS

## 2023-06-22 MED ORDER — INSULIN ASPART 100 UNIT/ML IJ SOLN
10.0000 [IU] | Freq: Once | INTRAMUSCULAR | Status: AC
  Administered 2023-06-22: 10 [IU] via SUBCUTANEOUS

## 2023-06-22 NOTE — ED Notes (Signed)
Patient sleeping , spouse at bedside

## 2023-06-22 NOTE — Discharge Instructions (Addendum)
 Your blood sugar was elevated likely due to the cortisone injection but responded appropriately to fluid resuscitation as well as subcutaneous insulin.  Continuing outpatient insulin regimen at home and follow-up with your primary care provider.  No indication for inpatient hospitalization at this time.  Your paresthesias were likely due to breathing fast, hyperventilation can cause CO2 loss and result in calcium shifts in your cells causing the paresthesias.  Your neurologic exam was reassuring.  Return for any severe worsening symptoms.

## 2023-06-27 ENCOUNTER — Encounter: Payer: Self-pay | Admitting: Gastroenterology

## 2023-06-28 ENCOUNTER — Encounter: Payer: Self-pay | Admitting: Podiatry

## 2023-06-28 ENCOUNTER — Ambulatory Visit (INDEPENDENT_AMBULATORY_CARE_PROVIDER_SITE_OTHER)

## 2023-06-28 ENCOUNTER — Other Ambulatory Visit: Payer: Self-pay

## 2023-06-28 ENCOUNTER — Ambulatory Visit: Admitting: Podiatry

## 2023-06-28 ENCOUNTER — Ambulatory Visit: Payer: 59 | Admitting: Family Medicine

## 2023-06-28 DIAGNOSIS — M7751 Other enthesopathy of right foot: Secondary | ICD-10-CM

## 2023-06-28 DIAGNOSIS — M2041 Other hammer toe(s) (acquired), right foot: Secondary | ICD-10-CM

## 2023-06-28 MED ORDER — MUPIROCIN 2 % EX OINT: 1.0000 | TOPICAL_OINTMENT | Freq: Two times a day (BID) | CUTANEOUS | 1 refills | Status: DC

## 2023-06-28 MED ORDER — COLCHICINE 0.6 MG PO TABS: 0.6000 mg | ORAL_TABLET | Freq: Every day | ORAL | 0 refills | Status: DC

## 2023-06-28 MED ORDER — DOXYCYCLINE HYCLATE 100 MG PO TABS: 100.0000 mg | ORAL_TABLET | Freq: Two times a day (BID) | ORAL | 0 refills | Status: DC

## 2023-06-28 NOTE — Progress Notes (Signed)
 Chief Complaint  Patient presents with   Diabetes    "This toe hurts to touch it.  It's red and swollen." N - painful toe L - 3rd right D - 2 weeks O - suddenly, gotten worse C - sore to touch, red, swelling, spreading to 1st and 2nd toes, Diabetic A - shoes, socks, pressure T - none     HPI: 43 y.o. female presenting today for evaluation of pain and tenderness associated to the right third digit.  Onset about 2 weeks ago.  Idiopathic onset.  Denies any history of injury.  Past Medical History:  Diagnosis Date   ADHD    Anemia    IRON TRANSFUSION 07-2020 NONE SINCE   Anxiety    Back pain    COVID 08/12/2019   ALL SYMPTOMS REOLVED PER PT   Depression    Diabetic neuropathy (HCC) 11/11/2020   FEET   dm type 2    sees Dr. Ocie Cornfield at [facility]   Fatty liver    GERD (gastroesophageal reflux disease)    History of kidney stones    Joint pain    Menorrhagia 11/11/2020   Migraines    Murmur, cardiac    FAINT NO CARDIOLOGIST   Other fatigue    Pneumonia 08/12/2019   COVID PNEUMONIA ALL SYMPTOMS RESOLVED PER PT   Shortness of breath on exertion     Past Surgical History:  Procedure Laterality Date   CESAREAN SECTION  12/13/2006   EXTRACORPOREAL SHOCK WAVE LITHOTRIPSY  2018   FOOT SURGERY Right 10/02/2020   RIGHT FOOT HEEL AND TOES, [facility] OFF [address]   LAPAROSCOPIC VAGINAL HYSTERECTOMY WITH SALPINGECTOMY Bilateral 11/17/2020   Procedure: LAPAROSCOPIC ASSISTED VAGINAL HYSTERECTOMY WITH BILATERAL SALPINGECTOMY;  Surgeon: Mitchel Honour, DO;  Location: [facility];  Service: Gynecology;  Laterality: Bilateral;    Allergies  Allergen Reactions   Amoxicillin Hives and Itching   Azithromycin     DOES NOT WORK   Dulaglutide Other (See Comments)    JANUVIA STOAMCH HURT?  Other Reaction(s): constipation and stomach issues    JANUVIA STOAMCH HURT?   Empagliflozin-Metformin Hcl Er Other (See Comments) and Swelling   Januvia  [Sitagliptin] Other (See Comments)    HURTS STOAMCH   Latex     IRRITATES VAGINAL AREA   Metformin     Other Reaction(s): achy all over   Penicillins Hives    Has patient had a PCN reaction causing immediate rash, facial/tongue/throat swelling, SOB or lightheadedness with hypotension: yes. Rash  Has patient had a PCN reaction causing severe rash involving mucus membranes or skin necrosis: Yes- rash and hives all over body  Has patient had a PCN reaction that required hospitalization No Has patient had a PCN reaction occurring within the last 10 years: Yes  If all of the above answers are "NO", then may proceed with Cephalosporin use.     RT third toe 06/28/2023  Physical Exam: General: The patient is alert and oriented x3 in no acute distress.  Dermatology: Skin is warm, dry and supple bilateral lower extremities.  There are some white chauxis noted to the nail plate of the right third digit.  No purulence.  Significant tenderness with palpation to the distal tip of the toe  Vascular: Palpable pedal pulses bilaterally. Capillary refill within normal limits.  Very mild erythema noted to the right third toe compared to the adjacent digits  Neurological: Grossly intact via light touch  Musculoskeletal Exam: No pedal deformities  noted.  Significant tenderness with pain to the distal tip of the right third toe  Radiographic Exam RT foot 06/28/2023:  Normal osseous mineralization. Joint spaces preserved.  No fractures or osseous irregularities noted.  Impression: Negative  Assessment/Plan of Care: 1.  Pain and tenderness right third toe; idiopathic 2.  Diabetes mellitus; uncontrolled  -Patient evaluated.  X-rays reviewed -Explained to the patient that although I am unsure what initially caused the pain associated to the third toe I do believe that the biggest concern would be possible infection.  I did call in a prescription for doxycycline 100 mg twice daily x 10 days -Prescription for  mupirocin ointment to apply to the nail plate daily -I did attempt debridement of the nail plate but it was very sensitive and it was well adhered.  No bleeding upon debridement. -Prescription for colchicine 0.6 mg daily x 2 weeks -Note was provided for the patient to refrain from work for the remainder of the week. -Return to clinic 2 weeks   *Works in [employer] at [employer]. Daughter, Ava, in 7th grade at [employer].    Felecia Shelling, DPM [facility]  Dr. Felecia Shelling, DPM    [address] [place], [place] [address]                Office [phone]  Fax [phone]

## 2023-06-29 ENCOUNTER — Other Ambulatory Visit (HOSPITAL_COMMUNITY): Payer: Self-pay | Admitting: Nurse Practitioner

## 2023-06-29 DIAGNOSIS — R188 Other ascites: Secondary | ICD-10-CM

## 2023-06-30 ENCOUNTER — Encounter: Payer: Self-pay | Admitting: Nurse Practitioner

## 2023-06-30 ENCOUNTER — Other Ambulatory Visit: Payer: Self-pay | Admitting: Nurse Practitioner

## 2023-06-30 DIAGNOSIS — K7469 Other cirrhosis of liver: Secondary | ICD-10-CM

## 2023-06-30 DIAGNOSIS — K7581 Nonalcoholic steatohepatitis (NASH): Secondary | ICD-10-CM

## 2023-06-30 DIAGNOSIS — R188 Other ascites: Secondary | ICD-10-CM

## 2023-07-04 ENCOUNTER — Encounter: Payer: Self-pay | Admitting: Podiatry

## 2023-07-04 ENCOUNTER — Other Ambulatory Visit (HOSPITAL_COMMUNITY): Payer: Self-pay | Admitting: Nurse Practitioner

## 2023-07-04 ENCOUNTER — Ambulatory Visit: Admitting: Podiatry

## 2023-07-04 DIAGNOSIS — K7469 Other cirrhosis of liver: Secondary | ICD-10-CM

## 2023-07-04 DIAGNOSIS — R768 Other specified abnormal immunological findings in serum: Secondary | ICD-10-CM

## 2023-07-04 DIAGNOSIS — E1165 Type 2 diabetes mellitus with hyperglycemia: Secondary | ICD-10-CM

## 2023-07-04 DIAGNOSIS — M79674 Pain in right toe(s): Secondary | ICD-10-CM | POA: Diagnosis not present

## 2023-07-04 DIAGNOSIS — L03031 Cellulitis of right toe: Secondary | ICD-10-CM

## 2023-07-04 DIAGNOSIS — B351 Tinea unguium: Secondary | ICD-10-CM | POA: Diagnosis not present

## 2023-07-04 NOTE — Patient Instructions (Signed)
 Place 1/4 cup of epsom salts in a quart of warm tap water.  Submerge your foot or feet in the solution and soak for 20 minutes.  This soak should be done twice a day.  Next, remove your foot or feet from solution, blot dry the affected area. Apply mupirocin ointment and cover area with Bandaid until a dry scab forms.  IF YOUR SKIN BECOMES IRRITATED WHILE USING THESE INSTRUCTIONS, IT IS OKAY TO SWITCH TO  WHITE VINEGAR AND WATER.  As another alternative soak, you may use antibacterial soap and water.  Monitor for any signs/symptoms of infection. Call the office immediately if any occur or go directly to the emergency room. Call with any questions/concerns.

## 2023-07-04 NOTE — Progress Notes (Unsigned)
 Subjective:  Patient ID: Laurie Sutton, female    DOB: Jul 13, 1980,  MRN: [id number]  Laurie Sutton presents to clinic today for:  Chief Complaint  Patient presents with   Nail Problem    "Look at my toe, it's hurting me really bad."   Patient notes that she has been seeing Dr. Logan Bores for a very painful right third toenail area.  Dr. Logan Bores had placed her on doxycycline, colchicine, and prescribed mupirocin ointment which she has been applying to the area.  Since taking the doxycycline she has had some improvement with the redness and swelling around the nail.  She continues to have pain, stating it is quite severe with any pressure on the toe.  Most of the pain is near the lateral border of the toenail and near the proximal nail fold.  Denies recent injury or pedicure.  She is scheduled to see Dr. Logan Bores for a follow-up, but was concerned about the continued significant pain since she is diabetic.  PCP is Nelwyn Salisbury, MD.  Allergies  Allergen Reactions   Amoxicillin Hives and Itching   Azithromycin     DOES NOT WORK   Dulaglutide Other (See Comments)    JANUVIA STOAMCH HURT?  Other Reaction(s): constipation and stomach issues    JANUVIA STOAMCH HURT?   Empagliflozin-Metformin Hcl Er Other (See Comments) and Swelling   Januvia [Sitagliptin] Other (See Comments)    HURTS STOAMCH   Latex     IRRITATES VAGINAL AREA   Metformin     Other Reaction(s): achy all over   Penicillins Hives    Has patient had a PCN reaction causing immediate rash, facial/tongue/throat swelling, SOB or lightheadedness with hypotension: yes. Rash  Has patient had a PCN reaction causing severe rash involving mucus membranes or skin necrosis: Yes- rash and hives all over body  Has patient had a PCN reaction that required hospitalization No Has patient had a PCN reaction occurring within the last 10 years: Yes  If all of the above answers are "NO", then may proceed with Cephalosporin use.      Review of Systems: Negative except as noted in the HPI.  Objective:  Laurie Sutton is a pleasant 43 y.o. female in NAD. AAO x 3.  Vascular Examination: Capillary refill time is 3-5 seconds to toes bilateral. Palpable pedal pulses b/l LE. Digital hair present b/l. No significant pedal edema b/l. Skin temperature gradient WNL b/l. No varicosities b/l. No cyanosis or clubbing noted b/l.   Dermatological Examination: There is white discoloration to the right third toenail.  There is some abnormality and thickening of the nail and cuticle area along the lateral nail margin of the right third toe.  There is significant pain on palpation to the entire nail plate, worsened toward the lateral aspect of the nail margin as well as along the proximal nail fold.  Minimal erythema is present.  No significant edema is noted.  No active drainage is seen.  Neurological Examination: Epicritic sensation is intact     09/28/2022   12:00 AM  Hemoglobin A1C  Hemoglobin-A1c 11.1         This result is from an external source.   Assessment/Plan: 1. Pain in right toe(s)   2. Onychomycosis   3. Cellulitis of right toe   4. Uncontrolled type 2 diabetes mellitus with hyperglycemia (HCC)    Discussed patient's condition today.  Due to the significant continued pain to the right third toenail  area, it was recommended to avulse the nail to see if that this provides improvement.  There may be an abscess subungual area or near the eponychium that is unable to drain.  After obtaining patient consent, the right third toe was anesthetized with a 50:50 mixture of 1% lidocaine plain and 0.5% bupivacaine plain for a total of 3cc's administered.  Upon confirmation of anesthesia, a freer elevator was utilized to free the right third toenail from the nail bed.  The nail was then avulsed proximal to the eponychium and removed in toto.  This nail was then packaged and prepped to be sent [facility] for fungal  culture and also requested bacterial culture.  The area was inspected for any remaining spicules.   No subungual or deeper abscess was seen today.  Antibiotic ointment and a DSD were applied, followed by a Coban dressing.  Patient tolerated the anesthetic and procedure well and will f/u as scheduled with Dr. Logan Bores at the [facility] office.  Patient given post-procedure instructions for daily 15-minute Epsom salt soaks, antibiotic (mupirocin) ointment and daily use of Bandaids until toe starts to dry / form eschar.   It should be noted that her last HbA1c from June 2024 was 11.1.  Hopefully this has improved since that was obtained.  I do not feel that the toe pain is directly related to neuropathy but it may be exacerbating the symptoms.  Clerance Lav, DPM, FACFAS [facility]     [address] [place], [place] [address]                Office [phone]  Fax [phone]

## 2023-07-06 ENCOUNTER — Ambulatory Visit (HOSPITAL_COMMUNITY)
Admission: RE | Admit: 2023-07-06 | Discharge: 2023-07-06 | Disposition: A | Discharge status: Home / Self Care | Source: Ambulatory Visit | Attending: Nurse Practitioner | Admitting: Nurse Practitioner

## 2023-07-06 ENCOUNTER — Encounter: Payer: Self-pay | Admitting: Podiatry

## 2023-07-06 ENCOUNTER — Other Ambulatory Visit: Payer: Self-pay

## 2023-07-06 DIAGNOSIS — R188 Other ascites: Secondary | ICD-10-CM | POA: Diagnosis present

## 2023-07-06 HISTORY — PX: IR PARACENTESIS: IMG2679

## 2023-07-06 LAB — BODY FLUID CELL COUNT WITH DIFFERENTIAL
Eos, Fluid: 1 %
Lymphs, Fluid: 32 %
Monocyte-Macrophage-Serous Fluid: 62 % (ref 50–90)
Neutrophil Count, Fluid: 5 % (ref 0–25)
Total Nucleated Cell Count, Fluid: 372 uL (ref 0–1000)

## 2023-07-06 LAB — GRAM STAIN

## 2023-07-06 LAB — ALBUMIN, PLEURAL OR PERITONEAL FLUID: Albumin, Fluid: <1.5 g/dL

## 2023-07-06 LAB — PROTEIN, PLEURAL OR PERITONEAL FLUID: Total protein, fluid: <3 g/dL

## 2023-07-06 MED ORDER — LIDOCAINE HCL 1 % IJ SOLN
INTRAMUSCULAR | Status: AC
  Filled 2023-07-06: qty 20

## 2023-07-06 MED ORDER — MOUNJARO 7.5 MG/0.5ML ~~LOC~~ SOAJ
SUBCUTANEOUS | 3 refills | Status: DC
Start: 2023-07-06 — End: 2023-12-15
  Filled 2023-07-06: qty 2, 28d supply, fill #0
  Filled 2023-08-12: qty 2, 28d supply, fill #1
  Filled 2023-09-20 – 2023-10-18 (×2): qty 2, 28d supply, fill #2

## 2023-07-06 MED ORDER — LIDOCAINE HCL 1 % IJ SOLN
20.0000 mL | Freq: Once | INTRAMUSCULAR | Status: AC
  Administered 2023-07-06: 10 mL

## 2023-07-06 NOTE — Procedures (Signed)
 PROCEDURE SUMMARY:  Successful ultrasound guided paracentesis from the left lower quadrant.  Yielded 2.7 L of clear yellow fluid.  No immediate complications.  The patient tolerated the procedure well.   Specimen sent for labs.  EBL < 2 mL  If the patient eventually requires >/=2 paracenteses in a 30 day period, screening evaluation by the [facility] will be assessed.  Alwyn Ren, AGACNP-BC 07/06/2023, 3:10 PM

## 2023-07-07 LAB — CYTOLOGY - NON PAP

## 2023-07-08 ENCOUNTER — Telehealth: Payer: Self-pay | Admitting: Podiatry

## 2023-07-08 ENCOUNTER — Ambulatory Visit: Admitting: Podiatry

## 2023-07-08 NOTE — Telephone Encounter (Signed)
 Patient called 07/08/23 at 7:10am to cancel appointment scheduled 07/08/23 at 8:15am due to an Emergency. I returned called at 7:48am and left message on voicemail

## 2023-07-11 ENCOUNTER — Ambulatory Visit
Admission: RE | Admit: 2023-07-11 | Discharge: 2023-07-11 | Disposition: A | Discharge status: Home / Self Care | Source: Ambulatory Visit | Attending: Nurse Practitioner | Admitting: Nurse Practitioner

## 2023-07-11 DIAGNOSIS — K7581 Nonalcoholic steatohepatitis (NASH): Secondary | ICD-10-CM

## 2023-07-11 DIAGNOSIS — R188 Other ascites: Secondary | ICD-10-CM

## 2023-07-11 DIAGNOSIS — K7469 Other cirrhosis of liver: Secondary | ICD-10-CM

## 2023-07-11 LAB — CULTURE, BODY FLUID W GRAM STAIN -BOTTLE: Culture: NO GROWTH

## 2023-07-12 ENCOUNTER — Other Ambulatory Visit: Payer: Self-pay | Admitting: Podiatry

## 2023-07-13 ENCOUNTER — Other Ambulatory Visit: Payer: Self-pay | Admitting: Nurse Practitioner

## 2023-07-13 DIAGNOSIS — K7469 Other cirrhosis of liver: Secondary | ICD-10-CM

## 2023-07-13 DIAGNOSIS — D376 Neoplasm of uncertain behavior of liver, gallbladder and bile ducts: Secondary | ICD-10-CM

## 2023-07-15 ENCOUNTER — Encounter: Payer: Self-pay | Admitting: Podiatry

## 2023-07-19 ENCOUNTER — Ambulatory Visit (AMBULATORY_SURGERY_CENTER): Payer: 59

## 2023-07-19 ENCOUNTER — Telehealth: Payer: Self-pay

## 2023-07-19 ENCOUNTER — Encounter: Payer: Self-pay | Admitting: Gastroenterology

## 2023-07-19 VITALS — Ht 66.0 in | Wt 196.0 lb

## 2023-07-19 DIAGNOSIS — K7581 Nonalcoholic steatohepatitis (NASH): Secondary | ICD-10-CM

## 2023-07-19 DIAGNOSIS — K5900 Constipation, unspecified: Secondary | ICD-10-CM

## 2023-07-19 NOTE — Progress Notes (Signed)
 No egg or soy allergy known to patient  No issues known to pt with past sedation with any surgeries or procedures Patient denies ever being told they had issues or difficulty with intubation  No FH of Malignant Hyperthermia Pt is not on diet pills Pt is not on  home 02  Pt is not on blood thinners  Pt has issues with constipation and takes lactalose No A fib or A flutter Have any cardiac testing pending--no Pt can ambulate independently Pt denies use of chewing tobacco Discussed diabetic I weight loss medication holds Discussed NSAID holds Checked BMI Pt instructed to use Singlecare.com or GoodRx for a price reduction on prep  Patient's chart reviewed by Cathlyn Parsons CNRA prior to previsit and patient appropriate for the [facility].  Pre visit completed and red dot placed by patient's name on their procedure day (on provider's schedule).

## 2023-07-19 NOTE — Telephone Encounter (Signed)
Completed PV

## 2023-07-20 ENCOUNTER — Encounter

## 2023-07-23 ENCOUNTER — Other Ambulatory Visit: Payer: Self-pay | Admitting: Gastroenterology

## 2023-07-25 ENCOUNTER — Other Ambulatory Visit: Payer: Self-pay | Admitting: Diagnostic Radiology

## 2023-07-25 DIAGNOSIS — Z01818 Encounter for other preprocedural examination: Secondary | ICD-10-CM

## 2023-07-26 ENCOUNTER — Other Ambulatory Visit: Payer: Self-pay | Admitting: Radiology

## 2023-07-26 ENCOUNTER — Other Ambulatory Visit (HOSPITAL_COMMUNITY): Payer: Self-pay | Admitting: Nurse Practitioner

## 2023-07-26 ENCOUNTER — Ambulatory Visit (HOSPITAL_COMMUNITY)
Admission: RE | Admit: 2023-07-26 | Discharge: 2023-07-26 | Disposition: A | Discharge status: Home / Self Care | Source: Ambulatory Visit | Attending: Nurse Practitioner | Admitting: Nurse Practitioner

## 2023-07-26 ENCOUNTER — Other Ambulatory Visit: Payer: Self-pay

## 2023-07-26 ENCOUNTER — Other Ambulatory Visit: Payer: Self-pay | Admitting: Family Medicine

## 2023-07-26 DIAGNOSIS — K7469 Other cirrhosis of liver: Secondary | ICD-10-CM

## 2023-07-26 DIAGNOSIS — Z7722 Contact with and (suspected) exposure to environmental tobacco smoke (acute) (chronic): Secondary | ICD-10-CM | POA: Insufficient documentation

## 2023-07-26 DIAGNOSIS — Z01818 Encounter for other preprocedural examination: Secondary | ICD-10-CM

## 2023-07-26 DIAGNOSIS — R768 Other specified abnormal immunological findings in serum: Secondary | ICD-10-CM | POA: Diagnosis not present

## 2023-07-26 DIAGNOSIS — K7460 Unspecified cirrhosis of liver: Secondary | ICD-10-CM | POA: Insufficient documentation

## 2023-07-26 HISTORY — PX: IR VENOGRAM HEPATIC W HEMODYNAMIC EVALUATION: IMG692

## 2023-07-26 HISTORY — PX: IR TRANSCATHETER BX: IMG713

## 2023-07-26 HISTORY — PX: IR US GUIDE VASC ACCESS RIGHT: IMG2390

## 2023-07-26 LAB — CBC
HCT: 38.5 % (ref 36.0–46.0)
Hemoglobin: 12.6 g/dL (ref 12.0–15.0)
MCH: 31.3 pg (ref 26.0–34.0)
MCHC: 32.7 g/dL (ref 30.0–36.0)
MCV: 95.5 fL (ref 80.0–100.0)
Platelets: 65 10*3/uL — ABNORMAL LOW (ref 150–400)
RBC: 4.03 MIL/uL (ref 3.87–5.11)
RDW: 15.3 % (ref 11.5–15.5)
WBC: 2.6 10*3/uL — ABNORMAL LOW (ref 4.0–10.5)
nRBC: 0 % (ref 0.0–0.2)

## 2023-07-26 LAB — GLUCOSE, CAPILLARY: Glucose-Capillary: 171 mg/dL — ABNORMAL HIGH (ref 70–99)

## 2023-07-26 LAB — PROTIME-INR
INR: 1.6 — ABNORMAL HIGH (ref 0.8–1.2)
Prothrombin Time: 19.5 s — ABNORMAL HIGH (ref 11.4–15.2)

## 2023-07-26 MED ORDER — MIDAZOLAM HCL 2 MG/2ML IJ SOLN
INTRAMUSCULAR | Status: AC | PRN
  Administered 2023-07-26: .5 mg via INTRAVENOUS

## 2023-07-26 MED ORDER — FENTANYL CITRATE (PF) 100 MCG/2ML IJ SOLN
INTRAMUSCULAR | Status: AC | PRN
  Administered 2023-07-26: 50 ug via INTRAVENOUS

## 2023-07-26 MED ORDER — LIDOCAINE-EPINEPHRINE 1 %-1:100000 IJ SOLN
INTRAMUSCULAR | Status: AC
  Filled 2023-07-26: qty 1

## 2023-07-26 MED ORDER — FENTANYL CITRATE (PF) 100 MCG/2ML IJ SOLN
INTRAMUSCULAR | Status: AC
  Filled 2023-07-26: qty 4

## 2023-07-26 MED ORDER — LIDOCAINE HCL 1 % IJ SOLN
20.0000 mL | Freq: Once | INTRAMUSCULAR | Status: AC
  Administered 2023-07-26: 10 mL via INTRADERMAL

## 2023-07-26 MED ORDER — MIDAZOLAM HCL 2 MG/2ML IJ SOLN
INTRAMUSCULAR | Status: AC | PRN
  Administered 2023-07-26: 1 mg via INTRAVENOUS

## 2023-07-26 MED ORDER — IOHEXOL 300 MG/ML  SOLN
50.0000 mL | Freq: Once | INTRAMUSCULAR | Status: AC | PRN
  Administered 2023-07-26: 20 mL

## 2023-07-26 MED ORDER — FENTANYL CITRATE (PF) 100 MCG/2ML IJ SOLN
INTRAMUSCULAR | Status: AC | PRN
  Administered 2023-07-26: 25 ug via INTRAVENOUS

## 2023-07-26 MED ORDER — FENTANYL CITRATE (PF) 100 MCG/2ML IJ SOLN
INTRAMUSCULAR | Status: AC | PRN
Start: 2023-07-26 — End: 2023-07-26
  Administered 2023-07-26: 25 ug via INTRAVENOUS

## 2023-07-26 MED ORDER — MIDAZOLAM HCL 2 MG/2ML IJ SOLN
INTRAMUSCULAR | Status: AC
  Filled 2023-07-26: qty 4

## 2023-07-26 NOTE — H&P (Signed)
 Chief Complaint: Cirrhosis, elevated liver chemistries, elevated immunoglobulins, positive ANA/ASMA, ascites; referred for random core liver biopsy (transjugular ) and possible paracentesis  Referring Provider(s): Drazek,D,CRNP  Supervising Physician: Roanna Banning  Patient Status: [facility] - Out-pt  History of Present Illness: Laurie Sutton is a 43 y.o. female with PMH sig for ADHD, anemia, anxiety/depression, DM, fatty liver/MASH cirrhosis, GERD, ascites, portal venous HTN, splenomegaly, renal stones, migraines who presents now with elevated liver chemistries, elevated immunoglobulins, positive ANA/ASMA. Latest abd [name] on 07/11/23 revealed:  1. Cirrhosis of the liver. 2. 1.1 x 0.8 x 1.2 cm hypoechoic mass in the left lobe liver. Recommend further evaluation with MRI of the abdomen with and without contrast. 3. Splenomegaly. 4. Cholelithiasis without sonographic evidence of acute cholecystitis. 5. 0.4 cm gallbladder polyp  MRI abd pend; she is known to IR team from paracentesis on 07/06/23  She is scheduled today for image guided transjugular liver biopsy/possible paracentesis.   Patient is Full Code  Past Medical History:  Diagnosis Date   ADHD    Allergy    Anemia    IRON TRANSFUSION 07-2020 NONE SINCE   Anxiety    Back pain    COVID 08/12/2019   ALL SYMPTOMS REOLVED PER PT   Depression    Diabetic neuropathy (HCC) 11/11/2020   FEET   dm type 2    sees Dr. Ocie Cornfield at [facility]   Fatty liver    GERD (gastroesophageal reflux disease)    History of kidney stones    Joint pain    Menorrhagia 11/11/2020   Migraines    Murmur, cardiac    FAINT NO CARDIOLOGIST   Other fatigue    Pneumonia 08/12/2019   COVID PNEUMONIA ALL SYMPTOMS RESOLVED PER PT   Shortness of breath on exertion     Past Surgical History:  Procedure Laterality Date   CESAREAN SECTION  12/13/2006   EXTRACORPOREAL SHOCK WAVE LITHOTRIPSY  2018   FOOT SURGERY Right 10/02/2020    RIGHT FOOT HEEL AND TOES, SURGICAL CENTER OFF [address]   IR PARACENTESIS  07/06/2023   LAPAROSCOPIC VAGINAL HYSTERECTOMY WITH SALPINGECTOMY Bilateral 11/17/2020   Procedure: LAPAROSCOPIC ASSISTED VAGINAL HYSTERECTOMY WITH BILATERAL SALPINGECTOMY;  Surgeon: Mitchel Honour, DO;  Location: [facility];  Service: Gynecology;  Laterality: Bilateral;    Allergies: Amoxicillin, Azithromycin, Dulaglutide, Empagliflozin-metformin hcl er, Januvia [sitagliptin], Latex, Metformin, and Penicillins  Medications: Prior to Admission medications   Medication Sig Start Date End Date Taking? Authorizing Provider  furosemide (LASIX) 20 MG tablet Take 2 tablets by mouth daily. 06/29/23 06/28/24 Yes [provider]  lactulose, encephalopathy, (CHRONULAC) 10 GM/15ML SOLN Take by mouth. 07/12/23 07/11/24 Yes [provider]  mupirocin ointment (BACTROBAN) 2 % Apply 1 Application topically 2 (two) times daily. 06/28/23  Yes Felecia Shelling, DPM  ondansetron (ZOFRAN-ODT) 4 MG disintegrating tablet Take 1 tablet (4 mg total) by mouth every 8 (eight) hours as needed for nausea or vomiting. 02/09/23  Yes Irene Limbo, FNP  albuterol (VENTOLIN HFA) 108 (90 Base) MCG/ACT inhaler INHALE 2 PUFFS INTO THE LUNGS UP TO EVERY 6 HOURS AS NEEDED FOR WHEEZING/SHORTNESS OF BREATH 08/20/22   Nelwyn Salisbury, MD  clotrimazole-betamethasone (LOTRISONE) cream APPLY 1 APPLICATION TOPICALLY TWICE A DAY AS NEEDED 05/26/23   Nelwyn Salisbury, MD  colchicine 0.6 MG tablet Take 1 tablet (0.6 mg total) by mouth daily. Patient not taking: Reported on 07/19/2023 06/28/23   Felecia Shelling, DPM  Continuous Blood Gluc Sensor (FREESTYLE  LIBRE 14 DAY SENSOR) MISC Apply topically every 14 (fourteen) days. Patient not taking: Reported on 07/19/2023 12/26/19   [provider]  Continuous Glucose Sensor (DEXCOM G7 SENSOR) MISC Use 1 sensor for continuous glucose monitoring every 10 days for 30 days 05/25/23   Ocie Cornfield, MD   Continuous Glucose Sensor (FREESTYLE LIBRE 3 PLUS SENSOR) MISC Change every 15 days. Patient not taking: Reported on 07/19/2023 05/23/23     Continuous Glucose Sensor (FREESTYLE LIBRE 3 SENSOR) MISC Apply sensor to upper back of arm, change sensor every 14 days. Patient not taking: Reported on 07/19/2023 12/30/22   Ocie Cornfield, MD  Cranberry 125 MG TABS Take by mouth 2 (two) times daily. Patient not taking: Reported on 06/28/2023    [provider]  doxycycline (VIBRA-TABS) 100 MG tablet Take 1 tablet (100 mg total) by mouth 2 (two) times daily. Patient not taking: Reported on 07/19/2023 06/28/23   Felecia Shelling, DPM  FLUoxetine (PROZAC) 40 MG capsule TAKE 1 CAPSULE BY MOUTH TWICE A DAY 03/16/23   Nelwyn Salisbury, MD  gabapentin (NEURONTIN) 100 MG capsule Take 1 capsule (100 mg total) by mouth 2 (two) times daily. Patient not taking: Reported on 07/19/2023 11/08/22   Nelwyn Salisbury, MD  HUMULIN R U-500 KWIKPEN 500 UNIT/ML KwikPen Use 120 units at breakfast, 100 at lunch, and 100 at dinner Patient taking differently: Use 60 units at breakfast, 65 at lunch, and 65 at dinner 04/08/22   Nelwyn Salisbury, MD  LINZESS 145 MCG CAPS capsule TAKE 1 CAPSULE BY MOUTH DAILY BEFORE BREAKFAST. Patient not taking: Reported on 07/19/2023 03/17/23   Sherrilyn Rist, MD  LORazepam (ATIVAN) 0.5 MG tablet Take 1 tablet (0.5 mg total) by mouth 2 (two) times daily as needed for anxiety. Patient not taking: Reported on 06/28/2023 12/29/22   Nelwyn Salisbury, MD  montelukast (SINGULAIR) 10 MG tablet Take 1 tablet (10 mg total) by mouth at bedtime. 08/25/22   Nelwyn Salisbury, MD  nortriptyline (PAMELOR) 10 MG capsule Take 3 capsules (30 mg total) by mouth at bedtime. Patient not taking: Reported on 06/28/2023 02/23/23   Glean Salvo, NP  omeprazole (PRILOSEC) 40 MG capsule TAKE 1 CAPSULE (40 MG TOTAL) BY MOUTH IN THE MORNING 07/25/23   Sherrilyn Rist, MD  propranolol (INDERAL) 60 MG tablet Take 1 tablet (60 mg total) by  mouth 2 (two) times daily. 02/23/23   Glean Salvo, NP  Sodium Sulfate-Mag Sulfate-KCl (SUTAB) [phone] MG TABS Use as directed for colonoscopy. MANUFACTURER CODES!! BIN: [id number] PCN: CN GROUP: [id number] MEMBER ID: [id number];QQV AS SECONDARY INSURANCE ;NO PRIOR AUTHORIZATION 04/20/23   Danis, Andreas Blower, MD  tirzepatide [facility]) 10 MG/0.5ML Pen Inject 10 mg into the skin once a week. 02/16/23     tirzepatide (MOUNJARO) 10 MG/0.5ML Pen Inject 10 mg into the skin once a week. 05/23/23     tirzepatide (MOUNJARO) 7.5 MG/0.5ML Pen Inject 7.5 mg Subcutaneous once a week 30 days 07/06/23        Family History  Problem Relation Age of Onset   Colon polyps Mother    Depression Mother    Anxiety disorder Mother    Colon polyps Father    Sleep apnea Father    Anxiety disorder Father    Depression Father    Diabetes Father    Bladder Cancer Father 46   Rheum arthritis Father    Migraines Maternal Aunt    Migraines Maternal Grandmother  Diabetes Maternal Grandfather    Healthy Daughter    Asthma Daughter    Anxiety disorder Daughter    Anxiety disorder Son    Asthma Son    Healthy Son    Cerebral aneurysm Cousin    Heart disease Other    Depression Other    Anxiety disorder Other    Sleep apnea Other    Colon cancer Neg Hx    Esophageal cancer Neg Hx    Stomach cancer Neg Hx    Rectal cancer Neg Hx     Social History   Socioeconomic History   Marital status: Married    Spouse name: Adam   Number of children: 2   Years of education: Not on file   Highest education level: 12th grade  Occupational History   Occupation: Arts administrator and Nutrition    Employer: [employer]    Comment: works 20 hours per week  Tobacco Use   Smoking status: Never    Passive exposure: Current   Smokeless tobacco: Never  Vaping Use   Vaping status: Never Used  Substance and Sexual Activity   Alcohol use: No    Alcohol/week: 0.0 standard drinks of alcohol   Drug use: No   Sexual  activity: Yes    Partners: Male    Birth control/protection: None  Other Topics Concern   Not on file  Social History Narrative   Not on file   Social Drivers of Health   Financial Resource Strain: Low Risk  (09/19/2022)   Overall Financial Resource Strain (CARDIA)    Difficulty of Paying Living Expenses: Not hard at all  Food Insecurity: Low Risk  (06/29/2023)   Received from [facility]   Hunger Vital Sign    Worried About Running Out of Food in the Last Year: Never true    Ran Out of Food in the Last Year: Never true  Transportation Needs: No Transportation Needs (06/29/2023)   Received from [facility]    In the past 12 months, has lack of reliable transportation kept you from medical appointments, meetings, work or from getting things needed for daily living? : No  Physical Activity: Insufficiently Active (06/04/2021)   Exercise Vital Sign    Days of Exercise per Week: 2 days    Minutes of Exercise per Session: 10 min  Stress: Stress Concern Present (09/19/2022)   [employer] of Occupational Health - Occupational Stress Questionnaire    Feeling of Stress : To some extent  Social Connections: Moderately Integrated (09/19/2022)   Social Connection and Isolation Panel [NHANES]    Frequency of Communication with Friends and Family: More than three times a week    Frequency of Social Gatherings with Friends and Family: Twice a week    Attends Religious Services: More than 4 times per year    Active Member of [employer] or Organizations: No    Attends Engineer, structural: Not on file    Marital Status: Married       Review of Systems: denies fever,HA,CP,dyspnea, cough, abd/back pain,N/V or bleeding  Vital Signs: BP (!) 106/57 (BP Location: Right Arm)   Pulse 69   Temp 98.4 F (36.9 C) (Oral)   Resp 17   Ht 5\' 6"  (1.676 m)   Wt 196 lb (88.9 kg)   LMP 11/13/2020 (Exact Date)   SpO2 98%   BMI 31.64 kg/m   Advance Care Plan: no documents on  file  Physical Exam: awake/alert; chest- CTA  bilat; heart-RRR, soft murmur, abd-obese, soft,+BS,NT; no LE edema  Imaging: US Abdomen Complete Result Date: 07/11/2023 CLINICAL DATA:  Cirrhosis, [name]. EXAM: ABDOMEN ULTRASOUND COMPLETE COMPARISON:  CT abdomen and pelvis June 06, 2023, limited abdomen ultrasound June 16, 2023 FINDINGS: Gallbladder: 0.4 cm polyp. Gallstones are noted, largest measures 0.4 cm. No wall thickening visualized. No sonographic Murphy sign noted by sonographer. Common bile duct: Diameter: 3.6 mm Liver: 1.1 x 0.8 x 1.2 cm hypoechoic mass in the left lobe liver. Nodular contour. Recanalized umbilical vein. Portal vein is patent on color Doppler imaging with normal direction of blood flow towards the liver. IVC: No abnormality visualized. Pancreas: Visualized portion unremarkable. Spleen: Spleen measures 12.8 x 19.7 x 7.8 cm with volume of 1,675. Right Kidney: Length: 12.9 cm. Echogenicity within normal limits. No mass or hydronephrosis visualized. Left Kidney: Length: 13.4 cm. Echogenicity within normal limits. No mass or hydronephrosis visualized.There is a 1.6 x 1.1 x 1.1 cm simple cyst in the left kidney no follow-up is recommended. Abdominal aorta: No aneurysm visualized. Other findings: None. IMPRESSION: 1. Cirrhosis of the liver. 2. 1.1 x 0.8 x 1.2 cm hypoechoic mass in the left lobe liver. Recommend further evaluation with MRI of the abdomen with and without contrast. 3. Splenomegaly. 4. Cholelithiasis without sonographic evidence of acute cholecystitis. 5. 0.4 cm gallbladder polyp. Electronically Signed   By: Sherian Rein M.D.   On: 07/11/2023 12:20   IR Paracentesis Result Date: 07/06/2023 INDICATION: Patient with a history of MASH cirrhosis presents today with ascites. Interventional radiology asked to perform a diagnostic and therapeutic paracentesis. EXAM: ULTRASOUND GUIDED PARACENTESIS MEDICATIONS: 1% lidocaine 15 mL COMPLICATIONS: None immediate. PROCEDURE: Informed  written consent was obtained from the patient after a discussion of the risks, benefits and alternatives to treatment. A timeout was performed prior to the initiation of the procedure. Initial ultrasound scanning demonstrates a large amount of ascites within the left lower abdominal quadrant. The left lower abdomen was prepped and draped in the usual sterile fashion. 1% lidocaine was used for local anesthesia. Following this, a 19 gauge, 7-cm, Yueh catheter was introduced. An ultrasound image was saved for documentation purposes. The paracentesis was performed. The catheter was removed and a dressing was applied. The patient tolerated the procedure well without immediate post procedural complication. FINDINGS: A total of approximately 2.7 L of clear yellow fluid was removed. Samples were sent to the laboratory as requested by the clinical team. IMPRESSION: Successful ultrasound-guided paracentesis yielding 2.7 liters of peritoneal fluid. Procedure performed by Alwyn Ren NP PLAN: If the patient eventually requires >/=2 paracenteses in a 30 day period, candidacy for formal evaluation by the [facility] Inter[facility] Rad[facility] will be assessed. Electronically Signed   By: Judie Petit.  Shick M.D.   On: 07/06/2023 15:11   DG Foot Complete Right Result Date: 06/28/2023 Please see detailed radiograph report in office note.   Labs:  CBC: Recent Labs    03/09/23 1436 06/21/23 2130 06/21/23 2133 06/21/23 2146 06/22/23 0220  WBC 2.2* 3.7*  --   --   --   HGB 13.0 12.2 12.2 12.2 10.5*  HCT 37.7 34.8* 36.0 36.0 31.0*  PLT 66* 68*  --   --   --     COAGS: Recent Labs    04/20/23 1542  INR 1.6*    BMP: Recent Labs    03/09/23 1436 06/06/23 1449 06/21/23 2130 06/21/23 2133 06/21/23 2146 06/22/23 0220  NA 133*  --  133* 137 137 140  K 3.2*  --  3.6 3.6 3.6 3.3*  CL 100  --  102  --   --   --   CO2 28  --  22  --   --   --   GLUCOSE 364*  --  367*  --   --   --   BUN  <5*  --  5*  --   --   --   CALCIUM 8.6*  --  8.4*  --   --   --   CREATININE 0.54 0.60 0.55  --   --   --   GFRNONAA >60  --  >60  --   --   --     LIVER FUNCTION TESTS: Recent Labs    03/09/23 1436 06/21/23 2130  BILITOT 1.7* 1.8*  AST 76* 46*  ALT 47* 31  ALKPHOS 150* 156*  PROT 7.0 7.2  ALBUMIN 3.4* 3.3*    TUMOR MARKERS: Recent Labs    04/20/23 1542  AFPTM 7.5*    Assessment and Plan: 43 y.o. female with PMH sig for ADHD, anemia, anxiety/depression, DM, fatty liver/MASH cirrhosis, GERD, ascites, portal venous HTN, splenomegaly, renal stones, migraines who presents now with elevated liver chemistries, elevated immunoglobulins, positive ANA/ASMA. Latest abd US  on 07/11/23 revealed:  1. Cirrhosis of the liver. 2. 1.1 x 0.8 x 1.2 cm hypoechoic mass in the left lobe liver. Recommend further evaluation with MRI of the abdomen with and without contrast. 3. Splenomegaly. 4. Cholelithiasis without sonographic evidence of acute cholecystitis. 5. 0.4 cm gallbladder polyp  MRI abd pend; she is known to IR team from paracentesis on 07/06/23  She is scheduled today for image guided transjugular liver biopsy- hepatic venous pressure measurements /possible paracentesis.Risks and benefits of procedures were discussed with the patient/spouse including, but not limited to bleeding, infection, damage to adjacent structures or low yield requiring additional tests.  All of the questions were answered and there is agreement to proceed.  Consent signed and in chart.    Thank you for allowing our service to participate in Laurie Sutton 's care.  Electronically Signed: D. Honore Lux, PA-C   07/26/2023, 12:06 PM      I spent a total of  25 minutes   in face to face in clinical consultation, greater than 50% of which was counseling/coordinating care for image guided transjugular liver biopsy- hepatic venous pressure measurements /possible paracentesis

## 2023-07-26 NOTE — Discharge Instructions (Signed)
 Please call Interventional Radiology clinic [phone] with any questions or concerns.   You may remove your dressing and shower tomorrow.   Liver Biopsy, Care After After a liver biopsy, it is common to have these things in the area where the biopsy was done. You may: Have pain. Feel sore. Have bruising. You may also feel tired for a few days. Follow these instructions at home: Medicines Take over-the-counter and prescription medicines only as told by your doctor. If you were prescribed an antibiotic medicine, take it as told by your doctor. Do not stop taking the antibiotic, even if you start to feel better. Do not take medicines that may thin your blood. These medicines include aspirin and ibuprofen. Take them only if your doctor tells you to. If told, take steps to prevent problems with pooping (constipation). You may need to: Drink enough fluid to keep your pee (urine) pale yellow. Take medicines. You will be told what medicines to take. Eat foods that are high in fiber. These include beans, whole grains, and fresh fruits and vegetables. Limit foods that are high in fat and sugar. These include fried or sweet foods. Ask your doctor if you should avoid driving or using machines while you are taking your medicine. Caring for your incision Follow instructions from your doctor about how to take care of your cut from surgery (incisions). Make sure you: Wash your hands with soap and water for at least 20 seconds before and after you change your bandage. If you cannot use soap and water, use hand sanitizer. Change your bandage. Leavestitches or skin glue in place for at least two weeks. Leave tape strips alone unless you are told to take them off. You may trim the edges of the tape strips if they curl up. Check your incision every day for signs of infection. Check for: Redness, swelling, or more pain. Fluid or blood. Warmth. Pus or a bad smell. Do not take baths, swim, or use a  hot tub. Ask your doctor about taking showers or sponge baths. Activity Rest at home for 1-2 days, or as told by your doctor. Get up to take short walks every 1 to 2 hours. Ask for help if you feel weak or unsteady. Do not lift anything that is heavier than 10 lb (4.5 kg), or the limit that you are told. Do not play contact sports for 2 weeks after the procedure. Return to your normal activities as told by your doctor. Ask what activities are safe for you. General instructions  Do not drink alcohol in the first week after the procedure. Plan to have a responsible adult care for you for the time you are told after you leave the hospital or clinic. This is important. It is up to you to get the results of your procedure. Ask how to get your results when they are ready. Keep all follow-up visits.   Contact a doctor if: You have more bleeding in your incision. Your incision swells, or is red and more painful. You have fluid that comes from your incision. You develop a rash. You have fever or chills. Get help right away if: You have swelling, bloating, or pain in your belly (abdomen). You get dizzy or faint. You vomit or you feel like vomiting. You have trouble breathing or feel short of breath. You have chest pain. You have problems talking or seeing. You have trouble with your balance or moving your arms or legs. These symptoms may be an emergency. Get help  right away. Call your local emergency services (911 in the [place] Do not wait to see if the symptoms will go away. Do not drive yourself to the hospital. Summary After the procedure, it is common to have pain, soreness, bruising, and tiredness. Your doctor will tell you how to take care of yourself at home. Change your bandage, take your medicines, and limit your activities as told by your doctor. Call your doctor if you have symptoms of infection. Get help right away if your belly swells, your cut bleeds a lot, or you have trouble  talking or breathing. This information is not intended to replace advice given to you by your health care provider. Make sure you discuss any questions you have with your healthcare provider. Document Revised: 02/11/2020 Document Reviewed: 02/11/2020 Elsevier Patient Education  2022 Elsevier Inc.     Moderate Conscious Sedation, Adult, Care After This sheet gives you information about how to care for yourself after your procedure. Your health care provider may also give you more specific instructions. If you have problems or questions, contact your health care provider. What can I expect after the procedure? After the procedure, it is common to have: Sleepiness for several hours. Impaired judgment for several hours. Difficulty with balance. Vomiting if you eat too soon. Follow these instructions at home: For the time period you were told by your health care provider: Rest. Do not participate in activities where you could fall or become injured. Do not drive or use machinery. Do not drink alcohol. Do not take sleeping pills or medicines that cause drowsiness. Do not make important decisions or sign legal documents. Do not take care of children on your own.        Eating and drinking Follow the diet recommended by your health care provider. Drink enough fluid to keep your urine pale yellow. If you vomit: Drink water, juice, or soup when you can drink without vomiting. Make sure you have little or no nausea before eating solid foods.    General instructions Take over-the-counter and prescription medicines only as told by your health care provider. Have a responsible adult stay with you for the time you are told. It is important to have someone help care for you until you are awake and alert. Do not smoke. Keep all follow-up visits as told by your health care provider. This is important. Contact a health care provider if: You are still sleepy or having trouble with balance after  24 hours. You feel light-headed. You keep feeling nauseous or you keep vomiting. You develop a rash. You have a fever. You have redness or swelling around the IV site. Get help right away if: You have trouble breathing. You have new-onset confusion at home. Summary After the procedure, it is common to feel sleepy, have impaired judgment, or feel nauseous if you eat too soon. Rest after you get home. Know the things you should not do after the procedure. Follow the diet recommended by your health care provider and drink enough fluid to keep your urine pale yellow. Get help right away if you have trouble breathing or new-onset confusion at home. This information is not intended to replace advice given to you by your health care provider. Make sure you discuss any questions you have with your health care provider. Document Revised: 07/27/2019 Document Reviewed: 02/22/2019 Elsevier Patient Education  2021 [employer].

## 2023-07-26 NOTE — Procedures (Signed)
 Vascular and Interventional Radiology Procedure Note  Patient: Laurie Sutton DOB: [birth year] Medical Record Number: [id number] Note Date/Time: 07/26/23 1:43 PM   Performing Physician: Art Largo, MD Assistant(s): None  Diagnosis: Cirrhosis. +ANA   Procedure:  HEPATIC VENOGRAPHY TRANSJUGULAR LIVER BIOPSY  Anesthesia: Conscious Sedation Complications: None Estimated Blood Loss: Minimal Specimens: Sent for Pathology  Findings:  - access via the RIGHT jugular vein. - Successful TJLB with 3 cores obtained. - Calculated portosystemic gradient of 20 mmHg, from RA 10 and HVWP 30  Plan: Bed rest for 2 hours.  See detailed procedure note with images in PACS. The patient tolerated the procedure well without incident or complication and was returned to Recovery in stable condition.    Art Largo, MD [facility] Radiology   Pager. [phone] Clinic. [phone]

## 2023-07-27 ENCOUNTER — Telehealth: Payer: Self-pay

## 2023-07-27 NOTE — Telephone Encounter (Signed)
 Procedure:endo/colon Procedure date: 08/02/23 Procedure location: [facility] Arrival Time: 6:45 Spoke with the patient Y/N: Y Any prep concerns? N  Has the patient obtained the prep from the pharmacy ? Y Do you have a care partner and transportation: Y Any additional concerns? N

## 2023-07-28 ENCOUNTER — Encounter (HOSPITAL_COMMUNITY): Payer: Self-pay | Admitting: Gastroenterology

## 2023-07-28 NOTE — Progress Notes (Signed)
 Patient for endo procedure 4/22, is currently on U500 insulin. Per hospital diabetic policy,patients' Endocrinologist has to give instructions on adjusting prior to procedure. I contacted patients Endocrinologist 4/17 Dr. Vick Gram, let her CMA know she needs instructions on this, also informed her on patient will be on clear liquid diet day prior. Patient updated that she will hear from office on this info, also given my number.

## 2023-07-29 ENCOUNTER — Other Ambulatory Visit: Payer: Self-pay | Admitting: Nurse Practitioner

## 2023-07-29 ENCOUNTER — Other Ambulatory Visit: Payer: Self-pay

## 2023-07-29 ENCOUNTER — Telehealth: Payer: Self-pay | Admitting: Nurse Practitioner

## 2023-07-29 MED ORDER — SUTAB 1479-225-188 MG PO TABS: ORAL_TABLET | ORAL | 0 refills | Status: DC

## 2023-07-29 NOTE — Telephone Encounter (Signed)
 Patient called the answering service.  Bowel prep for upcoming colonoscopy was not sent to her pharmacy.  Apparently it was sent to a pharmacy in [place] ??  I will figure out which bowel prep she was prescribed and contact her pharmacy on [address]

## 2023-08-01 LAB — SURGICAL PATHOLOGY

## 2023-08-01 NOTE — Anesthesia Preprocedure Evaluation (Addendum)
 Anesthesia Evaluation  Patient identified by MRN, date of birth, ID band Patient awake    Reviewed: Allergy & Precautions, NPO status , Patient's Chart, lab work & pertinent test results  History of Anesthesia Complications Negative for: history of anesthetic complications  Airway Mallampati: III  TM Distance: >3 FB Neck ROM: Full    Dental no notable dental hx. (+) Teeth Intact   Pulmonary    Pulmonary exam normal breath sounds clear to auscultation       Cardiovascular (-) hypertension(-) angina (-) Past MI Normal cardiovascular exam Rhythm:Regular Rate:Normal     Neuro/Psych  Headaches PSYCHIATRIC DISORDERS Anxiety        GI/Hepatic ,GERD  ,,(+) Cirrhosis   Esophageal Varices    , Hepatitis -MASH  Lab Results      Component                Value               Date                      ALT                      31                  06/21/2023                AST                      46 (H)              06/21/2023                ALKPHOS                  156 (H)             06/21/2023                BILITOT                  1.8 (H)             06/21/2023              Endo/Other  diabetes, Insulin  Dependent  On Monjauro  Renal/GU Lab Results      Component                Value               Date                           K                        3.3 (L)             06/22/2023                CREATININE               0.55                06/21/2023                GFRNONAA                 >60  06/21/2023                     Musculoskeletal   Abdominal   Peds  Hematology Lab Results      Component                Value               Date                      WBC                      2.6 (L)             07/26/2023                HGB                      12.6                07/26/2023                HCT                      38.5                07/26/2023                MCV                      95.5                 07/26/2023                PLT                      65 (L)              07/26/2023              Anesthesia Other Findings All: see list  Reproductive/Obstetrics                             Anesthesia Physical Anesthesia Plan  ASA: 3  Anesthesia Plan: MAC   Post-op Pain Management:    Induction: Intravenous  PONV Risk Score and Plan: Propofol  infusion and Treatment may vary due to age or medical condition  Airway Management Planned: Natural Airway and Nasal Cannula  Additional Equipment: None  Intra-op Plan:   Post-operative Plan:   Informed Consent: I have reviewed the patients History and Physical, chart, labs and discussed the procedure including the risks, benefits and alternatives for the proposed anesthesia with the patient or authorized representative who has indicated his/her understanding and acceptance.     Dental advisory given  Plan Discussed with: CRNA  Anesthesia Plan Comments: (EGD Colon for Rectal Bleeding esophagael varicies)       Anesthesia Quick Evaluation

## 2023-08-02 ENCOUNTER — Encounter (HOSPITAL_COMMUNITY): Payer: Self-pay | Admitting: Gastroenterology

## 2023-08-02 ENCOUNTER — Ambulatory Visit (HOSPITAL_COMMUNITY): Admitting: Anesthesiology

## 2023-08-02 ENCOUNTER — Other Ambulatory Visit: Payer: Self-pay

## 2023-08-02 ENCOUNTER — Ambulatory Visit (HOSPITAL_COMMUNITY)
Admission: RE | Admit: 2023-08-02 | Discharge: 2023-08-02 | Disposition: A | Discharge status: Home / Self Care | Payer: 59 | Attending: Gastroenterology | Admitting: Gastroenterology

## 2023-08-02 ENCOUNTER — Encounter (HOSPITAL_COMMUNITY)
Admission: RE | Disposition: A | Discharge status: Home / Self Care | Payer: Self-pay | Source: Home / Self Care | Attending: Gastroenterology

## 2023-08-02 DIAGNOSIS — D123 Benign neoplasm of transverse colon: Secondary | ICD-10-CM

## 2023-08-02 DIAGNOSIS — I8500 Esophageal varices without bleeding: Secondary | ICD-10-CM

## 2023-08-02 DIAGNOSIS — K6389 Other specified diseases of intestine: Secondary | ICD-10-CM

## 2023-08-02 DIAGNOSIS — K3189 Other diseases of stomach and duodenum: Secondary | ICD-10-CM | POA: Diagnosis not present

## 2023-08-02 DIAGNOSIS — I8511 Secondary esophageal varices with bleeding: Secondary | ICD-10-CM | POA: Diagnosis not present

## 2023-08-02 DIAGNOSIS — K7460 Unspecified cirrhosis of liver: Secondary | ICD-10-CM | POA: Diagnosis not present

## 2023-08-02 DIAGNOSIS — K625 Hemorrhage of anus and rectum: Secondary | ICD-10-CM

## 2023-08-02 DIAGNOSIS — K766 Portal hypertension: Secondary | ICD-10-CM | POA: Diagnosis not present

## 2023-08-02 DIAGNOSIS — K7581 Nonalcoholic steatohepatitis (NASH): Secondary | ICD-10-CM | POA: Insufficient documentation

## 2023-08-02 DIAGNOSIS — E119 Type 2 diabetes mellitus without complications: Secondary | ICD-10-CM | POA: Insufficient documentation

## 2023-08-02 DIAGNOSIS — Z7985 Long-term (current) use of injectable non-insulin antidiabetic drugs: Secondary | ICD-10-CM | POA: Insufficient documentation

## 2023-08-02 DIAGNOSIS — Q438 Other specified congenital malformations of intestine: Secondary | ICD-10-CM

## 2023-08-02 DIAGNOSIS — Z794 Long term (current) use of insulin: Secondary | ICD-10-CM | POA: Insufficient documentation

## 2023-08-02 DIAGNOSIS — K648 Other hemorrhoids: Secondary | ICD-10-CM | POA: Diagnosis not present

## 2023-08-02 DIAGNOSIS — K5900 Constipation, unspecified: Secondary | ICD-10-CM | POA: Diagnosis not present

## 2023-08-02 DIAGNOSIS — I8510 Secondary esophageal varices without bleeding: Secondary | ICD-10-CM

## 2023-08-02 DIAGNOSIS — K5909 Other constipation: Secondary | ICD-10-CM

## 2023-08-02 HISTORY — PX: POLYPECTOMY: SHX149

## 2023-08-02 HISTORY — PX: ESOPHAGOGASTRODUODENOSCOPY (EGD) WITH PROPOFOL: SHX5813

## 2023-08-02 HISTORY — PX: COLONOSCOPY WITH PROPOFOL: SHX5780

## 2023-08-02 LAB — GLUCOSE, CAPILLARY: Glucose-Capillary: 160 mg/dL — ABNORMAL HIGH (ref 70–99)

## 2023-08-02 SURGERY — COLONOSCOPY WITH PROPOFOL
Anesthesia: Monitor Anesthesia Care

## 2023-08-02 MED ORDER — DEXMEDETOMIDINE HCL IN NACL 80 MCG/20ML IV SOLN
INTRAVENOUS | Status: DC | PRN
  Administered 2023-08-02: 12 ug via INTRAVENOUS

## 2023-08-02 MED ORDER — FENTANYL CITRATE (PF) 100 MCG/2ML IJ SOLN
INTRAMUSCULAR | Status: DC | PRN
  Administered 2023-08-02 (×2): 50 ug via INTRAVENOUS

## 2023-08-02 MED ORDER — FENTANYL CITRATE (PF) 100 MCG/2ML IJ SOLN
INTRAMUSCULAR | Status: AC
  Filled 2023-08-02: qty 2

## 2023-08-02 MED ORDER — SODIUM CHLORIDE 0.9 % IV SOLN
INTRAVENOUS | Status: AC | PRN
  Administered 2023-08-02: 500 mL via INTRAMUSCULAR

## 2023-08-02 MED ORDER — PROPOFOL 500 MG/50ML IV EMUL
INTRAVENOUS | Status: AC
  Filled 2023-08-02: qty 150

## 2023-08-02 MED ORDER — LACTATED RINGERS IV SOLN: INTRAVENOUS | Status: DC | PRN

## 2023-08-02 MED ORDER — PROPOFOL 500 MG/50ML IV EMUL
INTRAVENOUS | Status: DC | PRN
  Administered 2023-08-02: 100 ug/kg/min via INTRAVENOUS
  Administered 2023-08-02: 50 mg via INTRAVENOUS

## 2023-08-02 MED ORDER — PROPOFOL 1000 MG/100ML IV EMUL
INTRAVENOUS | Status: AC
  Filled 2023-08-02: qty 200

## 2023-08-02 MED ORDER — SODIUM CHLORIDE 0.9 % IV SOLN
INTRAVENOUS | Status: DC
Start: 2023-08-02 — End: 2023-08-02

## 2023-08-02 MED ORDER — DEXMEDETOMIDINE HCL IN NACL 80 MCG/20ML IV SOLN
INTRAVENOUS | Status: AC
Start: 2023-08-02 — End: ?
  Filled 2023-08-02: qty 20

## 2023-08-02 SURGICAL SUPPLY — 20 items
BLOCK BITE 60FR ADLT L/F BLUE (MISCELLANEOUS) ×2 IMPLANT
ELECTRODE REM PT RTRN 9FT ADLT (ELECTROSURGICAL) IMPLANT
FORCEP RJ3 GP 1.8X160 W-NEEDLE (CUTTING FORCEPS) IMPLANT
FORCEPS BIOP RAD 4 LRG CAP 4 (CUTTING FORCEPS) IMPLANT
FORCEPS BXJMBJMB 240X2.8X (CUTTING FORCEPS) IMPLANT
INJECTOR/SNARE I SNARE (MISCELLANEOUS) IMPLANT
LUBRICANT JELLY 4.5OZ STERILE (MISCELLANEOUS) IMPLANT
MANIFOLD NEPTUNE II (INSTRUMENTS) IMPLANT
NDL SCLEROTHERAPY 25GX240 (NEEDLE) IMPLANT
NEEDLE SCLEROTHERAPY 25GX240 (NEEDLE) IMPLANT
PAD FLOOR 36X40 (MISCELLANEOUS) ×2 IMPLANT
PROBE APC STR FIRE (PROBE) IMPLANT
PROBE INJECTION GOLD 7FR (MISCELLANEOUS) IMPLANT
SNARE ROTATE MED OVAL 20MM (MISCELLANEOUS) IMPLANT
SNARE SHORT THROW 13M SML OVAL (MISCELLANEOUS) IMPLANT
SYR 50ML LL SCALE MARK (SYRINGE) IMPLANT
TRAP SPECIMEN MUCOUS 40CC (MISCELLANEOUS) IMPLANT
TUBING ENDO SMARTCAP PENTAX (MISCELLANEOUS) ×4 IMPLANT
TUBING IRRIGATION ENDOGATOR (MISCELLANEOUS) ×2 IMPLANT
WATER STERILE IRR 1000ML POUR (IV SOLUTION) IMPLANT

## 2023-08-02 NOTE — Interval H&P Note (Signed)
 History and Physical Interval Note:  08/02/2023 7:24 AM  Laurie Sutton  has presented today for surgery, with the diagnosis of rectal bleeding, esophageal varices, cirrhosis.  The various methods of treatment have been discussed with the patient and family. After consideration of risks, benefits and other options for treatment, the patient has consented to  Procedure(s): COLONOSCOPY WITH PROPOFOL  (N/A) ESOPHAGOGASTRODUODENOSCOPY (EGD) WITH PROPOFOL  (N/A) as a surgical intervention.  The patient's history has been reviewed, patient examined, no change in status, stable for surgery.  I have reviewed the patient's chart and labs.  Questions were answered to the patient's satisfaction.     [name]

## 2023-08-02 NOTE — H&P (Signed)
 History and Physical:  This patient presents for endoscopic testing for: Constipation Rectal bleeding Cirrhosis-screening for varices  43 year old woman known to me for outpatient care, last seen in the office 04/17/2023 with clinical details in that note.  She has cirrhosis from [place], has rectal bleeding and chronic constipation.  She has known esophageal varices and is here for evaluation of all of those things today.  She has since been seen by the [facility] hepatology practice with a consult note from that visit on file and reviewed. Thrombocytopenia noted below  Patient is otherwise without complaints or active issues today.   Past Medical History: Past Medical History:  Diagnosis Date   ADHD    Allergy    Anemia    IRON TRANSFUSION 07-2020 NONE SINCE   Anxiety    Back pain    COVID 08/12/2019   ALL SYMPTOMS REOLVED PER PT   Depression    Diabetic neuropathy (HCC) 11/11/2020   FEET   dm type 2    sees Dr. Laurence Pons at [facility]   Fatty liver    GERD (gastroesophageal reflux disease)    History of kidney stones    Joint pain    Menorrhagia 11/11/2020   Migraines    Murmur, cardiac    FAINT NO CARDIOLOGIST   Other fatigue    Pneumonia 08/12/2019   COVID PNEUMONIA ALL SYMPTOMS RESOLVED PER PT   Shortness of breath on exertion      Past Surgical History: Past Surgical History:  Procedure Laterality Date   CESAREAN SECTION  12/13/2006   EXTRACORPOREAL SHOCK WAVE LITHOTRIPSY  2018   FOOT SURGERY Right 10/02/2020   RIGHT FOOT HEEL AND TOES, [facility] OFF [address]   IR PARACENTESIS  07/06/2023   IR TRANSCATHETER BX  07/26/2023   IR US  GUIDE VASC ACCESS RIGHT  07/26/2023   IR VENOGRAM HEPATIC W HEMODYNAMIC EVALUATION  07/26/2023   LAPAROSCOPIC VAGINAL HYSTERECTOMY WITH SALPINGECTOMY Bilateral 11/17/2020   Procedure: LAPAROSCOPIC ASSISTED VAGINAL HYSTERECTOMY WITH BILATERAL SALPINGECTOMY;  Surgeon: Dyanna Glasgow, DO;  Location: [facility];   Service: Gynecology;  Laterality: Bilateral;    Allergies: Allergies  Allergen Reactions   Amoxicillin Hives and Itching   Azithromycin      DOES NOT WORK   Dulaglutide  Other (See Comments)    JANUVIA  STOAMCH HURT?  Other Reaction(s): constipation and stomach issues    JANUVIA  STOAMCH HURT?   Empagliflozin -Metformin  Hcl Er Other (See Comments) and Swelling   Januvia  [Sitagliptin ] Other (See Comments)    HURTS STOAMCH   Latex     IRRITATES VAGINAL AREA   Metformin      Other Reaction(s): achy all over   Penicillins Hives    Has patient had a PCN reaction causing immediate rash, facial/tongue/throat swelling, SOB or lightheadedness with hypotension: yes. Rash  Has patient had a PCN reaction causing severe rash involving mucus membranes or skin necrosis: Yes- rash and hives all over body  Has patient had a PCN reaction that required hospitalization No Has patient had a PCN reaction occurring within the last 10 years: Yes  If all of the above answers are "NO", then may proceed with Cephalosporin use.     Outpatient Meds: Current Facility-Administered Medications  Medication Dose Route Frequency Provider Last Rate Last Admin   0.9 %  sodium chloride  infusion   Intravenous Continuous Danis, Roel Clarity III, MD       0.9 %  sodium chloride  infusion    Continuous PRN Kerby Pearson  III, MD 10 mL/hr at 08/02/23 0714 500 mL at 08/02/23 0714      ___________________________________________________________________ Objective   Exam:  BP 118/62   Pulse 79   Temp 98.5 F (36.9 C) (Temporal)   Resp 20   Ht 5\' 6"  (1.676 m)   Wt 92.1 kg   LMP 11/13/2020 (Exact Date)   SpO2 95%   BMI 32.77 kg/m   CV: regular , S1/S2 Resp: clear to auscultation bilaterally, normal RR and effort noted GI: soft, no tenderness, with active bowel sounds.     Latest Ref Rng & Units 07/26/2023   11:35 AM 06/22/2023    2:20 AM 06/21/2023    9:46 PM  CBC  WBC 4.0 - 10.5 K/uL 2.6     Hemoglobin 12.0 -  15.0 g/dL 78.4  69.6  29.5   Hematocrit 36.0 - 46.0 % 38.5  31.0  36.0   Platelets 150 - 400 K/uL 65      Lab Results  Component Value Date   INR 1.6 (H) 07/26/2023   INR 1.6 (H) 04/20/2023   INR 1.3 (H) 03/23/2022     Assessment: Constipation Rectal bleeding Cirrhosis with varices Plan: Colonoscopy EGD  The benefits and risks of the planned procedure(s) were described in detail with the patient or (when appropriate) their health care proxy.  Risks were outlined as including, but not limited to, bleeding, infection, perforation, adverse medication reaction leading to cardiac or pulmonary decompensation, pancreatitis (if ERCP).  The limitation of incomplete mucosal visualization was also discussed.  No guarantees or warranties were given.  The patient is appropriate for an endoscopic procedure in the ambulatory setting.   - Lorella Roles, MD

## 2023-08-02 NOTE — Op Note (Signed)
 [facility] Patient Name: Laurie Sutton Procedure Date: 08/02/2023 MRN: [id number] Attending MD: Roel Clarity. Dominic Friendly , MD, [id number] Date of Birth: 1981-03-24 CSN: [id number] Age: 43 Admit Type: Outpatient Procedure:                Colonoscopy Indications:              Rectal bleeding, Constipation                           See H&P-cirrhosis, platelets 65K, INR 1.2 Providers:                Ace Abu L. Dominic Friendly, MD, Ambrose Junk, RN, Tyrus Gallus,                            Technician Referring MD:              Medicines:                Monitored Anesthesia Care Complications:            No immediate complications. Estimated Blood Loss:     Estimated blood loss was minimal. Procedure:                Pre-Anesthesia Assessment:                           - Prior to the procedure, a History and Physical                            was performed, and patient medications and                            allergies were reviewed. The patient's tolerance of                            previous anesthesia was also reviewed. The risks                            and benefits of the procedure and the sedation                            options and risks were discussed with the patient.                            All questions were answered, and informed consent                            was obtained. Prior Anticoagulants: The patient has                            taken no anticoagulant or antiplatelet agents. ASA                            Grade Assessment: III - A patient with severe  systemic disease. After reviewing the risks and                            benefits, the patient was deemed in satisfactory                            condition to undergo the procedure.                           After obtaining informed consent, the colonoscope                            was passed under direct vision. Throughout the                            procedure, the  patient's blood pressure, pulse, and                            oxygen saturations were monitored continuously. The                            [id number]-HQ190L ([id number]) Olympus colonoscope was                            introduced through the anus and advanced to the the                            cecum, identified by appendiceal orifice and                            ileocecal valve. The patient tolerated the                            procedure well. The quality of the bowel                            preparation was good. The ileocecal valve,                            appendiceal orifice, and rectum were photographed.                            The colonoscopy was somewhat difficult due to a                            redundant colon. Successful completion of the                            procedure was aided by using manual pressure and                            straightening and shortening the scope to obtain  bowel loop reduction. Scope In: 7:48:12 AM Scope Out: 8:14:58 AM Scope Withdrawal Time: 0 hours 16 minutes 27 seconds  Total Procedure Duration: 0 hours 26 minutes 46 seconds  Findings:      Hemorrhoids were found on perianal exam.      Repeat examination of right colon under NBI performed.      An area of congested/edematous mucosa was found in the entire colon.       (Portal colopathy) this led to friability in some areas with scope       pressure or suction.      A 4 mm polyp was found in the transverse colon. The polyp was       semi-sessile. The polyp was removed with a cold snare. Resection and       retrieval were complete. Polypectomy site observed during brief post       polypectomy oozing, bleeding stopped in less than a minute.      The sigmoid colon was redundant.      Internal hemorrhoids were found. The hemorrhoids were medium-sized.      Retroflexion in the rectum was not performed due to anatomy.      The exam was otherwise without  abnormality. Impression:               - Hemorrhoids found on perianal exam.                           - Congested mucosa in the entire examined colon.                            (Portal colopathy)                           - One 4 mm polyp in the transverse colon, removed                            with a cold snare. Resected and retrieved.                           - Redundant colon.                           - Internal hemorrhoids.                           - The examination was otherwise normal. Moderate Sedation:      MAC sedation used Recommendation:           - Patient has a contact number available for                            emergencies. The signs and symptoms of potential                            delayed complications were discussed with the                            patient. Return to normal activities tomorrow.  Written discharge instructions were provided to the                            patient.                           - Resume previous diet.                           - Continue present medications.                           - Await pathology results.                           - Repeat colonoscopy is recommended for                            surveillance. The colonoscopy date will be                            determined after pathology results from today's                            exam become available for review.                           - See the other procedure note for documentation of                            additional recommendations.                           - Continue current treatments for constipation and                            as needed use of OTC Preparation H suppositories                            for hemorrhoidal bleeding. Procedure Code(s):        --- Professional ---                           [id number], Colonoscopy, flexible; with removal of                            tumor(s), polyp(s), or other lesion(s) by  snare                            technique Diagnosis Code(s):        --- Professional ---                           K63.89, Other specified diseases of intestine                           D12.3, Benign neoplasm of transverse colon (  hepatic                            flexure or splenic flexure)                           K64.8, Other hemorrhoids                           K62.5, Hemorrhage of anus and rectum                           K59.00, Constipation, unspecified                           Q43.8, Other specified congenital malformations of                            intestine CPT copyright 2022 American Medical Association. All rights reserved. The codes documented in this report are preliminary and upon coder review may  be revised to meet current compliance requirements. Miles Leyda L. Dominic Friendly, MD 08/02/2023 8:34:06 AM This report has been signed electronically. Number of Addenda: 0

## 2023-08-02 NOTE — Discharge Instructions (Signed)
YOU HAD AN ENDOSCOPIC PROCEDURE TODAY: Refer to the procedure report and other information in the discharge instructions given to you for any specific questions about what was found during the examination. If this information does not answer your questions, please call  office at [phone] to clarify.  ° °YOU SHOULD EXPECT: Some feelings of bloating in the abdomen. Passage of more gas than usual. Walking can help get rid of the air that was put into your GI tract during the procedure and reduce the bloating. If you had a lower endoscopy (such as a colonoscopy or flexible sigmoidoscopy) you may notice spotting of blood in your stool or on the toilet paper. Some abdominal soreness may be present for a day or two, also. ° °DIET: Your first meal following the procedure should be a light meal and then it is ok to progress to your normal diet. A half-sandwich or bowl of soup is an example of a good first meal. Heavy or fried foods are harder to digest and may make you feel nauseous or bloated. Drink plenty of fluids but you should avoid alcoholic beverages for 24 hours. If you had a esophageal dilation, please see attached instructions for diet.   ° °ACTIVITY: Your care partner should take you home directly after the procedure. You should plan to take it easy, moving slowly for the rest of the day. You can resume normal activity the day after the procedure however YOU SHOULD NOT DRIVE, use power tools, machinery or perform tasks that involve climbing or major physical exertion for 24 hours (because of the sedation medicines used during the test).  ° °SYMPTOMS TO REPORT IMMEDIATELY: °A gastroenterologist can be reached at any hour. Please call [phone]  for any of the following symptoms:  °Following lower endoscopy (colonoscopy, flexible sigmoidoscopy) °Excessive amounts of blood in the stool  °Significant tenderness, worsening of abdominal pains  °Swelling of the abdomen that is new, acute  °Fever of 100° or  higher  °Following upper endoscopy (EGD, EUS, ERCP, esophageal dilation) °Vomiting of blood or coffee ground material  °New, significant abdominal pain  °New, significant chest pain or pain under the shoulder blades  °Painful or persistently difficult swallowing  °New shortness of breath  °Black, tarry-looking or red, bloody stools ° °FOLLOW UP:  °If any biopsies were taken you will be contacted by phone or by letter within the next 1-3 weeks. Call [phone]  if you have not heard about the biopsies in 3 weeks.  °Please also call with any specific questions about appointments or follow up tests. ° °

## 2023-08-02 NOTE — Anesthesia Postprocedure Evaluation (Signed)
 Anesthesia Post Note  Patient: Laurie Sutton  Procedure(s) Performed: COLONOSCOPY WITH PROPOFOL  ESOPHAGOGASTRODUODENOSCOPY (EGD) WITH PROPOFOL  POLYPECTOMY, INTESTINE     Patient location during evaluation: Endoscopy Anesthesia Type: MAC Level of consciousness: awake and alert Pain management: pain level controlled Vital Signs Assessment: post-procedure vital signs reviewed and stable Respiratory status: spontaneous breathing, nonlabored ventilation, respiratory function stable and patient connected to nasal cannula oxygen Cardiovascular status: blood pressure returned to baseline and stable Postop Assessment: no apparent nausea or vomiting Anesthetic complications: no  No notable events documented.  Last Vitals:  Vitals:   08/02/23 0830 08/02/23 0840  BP: (!) 94/56 (!) 91/54  Pulse: 85 84  Resp: 14 13  Temp:    SpO2: 96% 95%    Last Pain:  Vitals:   08/02/23 0840  TempSrc:   PainSc: 0-No pain                 Rosalita Combe

## 2023-08-02 NOTE — Op Note (Signed)
 [facility] Patient Name: Laurie Sutton Procedure Date: 08/02/2023 MRN: [id number] Attending MD: Roel Clarity. Dominic Friendly , MD, [id number] Date of Birth: Sep 01, 1980 CSN: [id number] Age: 43 Admit Type: Outpatient Procedure:                Upper GI endoscopy Indications:              Follow-up of esophageal varices Providers:                Ace Abu L. Dominic Friendly, MD, Ambrose Junk, RN, Tyrus Gallus,                            Technician Referring MD:              Medicines:                Monitored Anesthesia Care Complications:            No immediate complications. Estimated Blood Loss:     Estimated blood loss: none. Procedure:                Pre-Anesthesia Assessment:                           - Prior to the procedure, a History and Physical                            was performed, and patient medications and                            allergies were reviewed. The patient's tolerance of                            previous anesthesia was also reviewed. The risks                            and benefits of the procedure and the sedation                            options and risks were discussed with the patient.                            All questions were answered, and informed consent                            was obtained. Prior Anticoagulants: The patient has                            taken no anticoagulant or antiplatelet agents. ASA                            Grade Assessment: III - A patient with severe                            systemic disease. After reviewing the risks and  benefits, the patient was deemed in satisfactory                            condition to undergo the procedure.                           After obtaining informed consent, the endoscope was                            passed under direct vision. Throughout the                            procedure, the patient's blood pressure, pulse, and                            oxygen  saturations were monitored continuously. The                            [id number]-[id number] ([id number]) Olympus endoscope was introduced                            through the mouth, and advanced to the second part                            of duodenum. The upper GI endoscopy was                            accomplished without difficulty. The patient                            tolerated the procedure fairly well. Scope In: Scope Out: Findings:      Grade II varices were found in the middle third of the esophagus and in       the lower third of the esophagus.      Portal hypertensive gastropathy was found in the entire examined       stomach. Friable to scope pressure in some areas. Scant scattered flecks       of heme.      Mucosal changes characterized by nodularity (polypoid nodularity as       well) were found in the gastric antrum. There were two chains of this in       the prepyloric area which might be gastric varices despite the atypical       location for that.      The cardia and gastric fundus were normal on retroflexion. Specifically,       no varices were seen in the fundus or cardia.      The examined duodenum was normal. Impression:               - Grade II esophageal varices.                           - Portal hypertensive gastropathy.                           - Nodular mucosa in the antrum.                           -  Normal examined duodenum.                           - No specimens collected. Moderate Sedation:      MAC sedation used Recommendation:           - Patient has a contact number available for                            emergencies. The signs and symptoms of potential                            delayed complications were discussed with the                            patient. Return to normal activities tomorrow.                            Written discharge instructions were provided to the                            patient.                           - Resume  previous diet.                           - Continue present medications. Continue                            beta-blocker for variceal bleeding prophylaxis and                            regular follow-up as scheduled with [facility]                            [facility].                           - See the other procedure note for documentation of                            additional recommendations. Procedure Code(s):        --- Professional ---                           [phone], Esophagogastroduodenoscopy, flexible,                            transoral; diagnostic, including collection of                            specimen(s) by brushing or washing, when performed                            (separate procedure) Diagnosis Code(s):        --- Professional ---  I85.00, Esophageal varices without bleeding                           K76.6, Portal hypertension                           K31.89, Other diseases of stomach and duodenum CPT copyright 2022 [employer]. All rights reserved. The codes documented in this report are preliminary and upon coder review may  be revised to meet current compliance requirements. Dulcie Gammon L. Dominic Friendly, MD 08/02/2023 8:27:07 AM This report has been signed electronically. Number of Addenda: 0

## 2023-08-02 NOTE — Transfer of Care (Signed)
 Immediate Anesthesia Transfer of Care Note  Patient: Laurie Sutton  Procedure(s) Performed: Procedure(s): COLONOSCOPY WITH PROPOFOL  (N/A) ESOPHAGOGASTRODUODENOSCOPY (EGD) WITH PROPOFOL  (N/A) POLYPECTOMY, INTESTINE  Patient Location: PACU and Endoscopy Unit  Anesthesia Type:MAC  Level of Consciousness: awake, alert  and oriented  Airway & Oxygen Therapy: Patient Spontanous Breathing and Patient connected to nasal cannula oxygen  Post-op Assessment: Report given to RN and Post -op Vital signs reviewed and stable  Post vital signs: Reviewed and stable  Last Vitals:  Vitals:   08/02/23 0644  BP: 118/62  Pulse: 79  Resp: 20  Temp: 36.9 C  SpO2: 95%    Complications: No apparent anesthesia complications

## 2023-08-03 ENCOUNTER — Encounter (HOSPITAL_COMMUNITY): Payer: Self-pay | Admitting: Gastroenterology

## 2023-08-03 ENCOUNTER — Other Ambulatory Visit: Payer: Self-pay

## 2023-08-03 LAB — SURGICAL PATHOLOGY

## 2023-08-05 ENCOUNTER — Other Ambulatory Visit (HOSPITAL_COMMUNITY): Payer: Self-pay | Admitting: Nurse Practitioner

## 2023-08-05 DIAGNOSIS — R188 Other ascites: Secondary | ICD-10-CM

## 2023-08-10 ENCOUNTER — Encounter: Payer: Self-pay | Admitting: Nurse Practitioner

## 2023-08-11 ENCOUNTER — Encounter: Payer: Self-pay | Admitting: Gastroenterology

## 2023-08-12 ENCOUNTER — Other Ambulatory Visit: Payer: Self-pay

## 2023-08-18 ENCOUNTER — Ambulatory Visit (HOSPITAL_COMMUNITY)
Admission: RE | Admit: 2023-08-18 | Discharge: 2023-08-18 | Disposition: A | Discharge status: Home / Self Care | Source: Ambulatory Visit | Attending: Nurse Practitioner | Admitting: Nurse Practitioner

## 2023-08-18 DIAGNOSIS — R188 Other ascites: Secondary | ICD-10-CM | POA: Insufficient documentation

## 2023-08-18 HISTORY — PX: IR PARACENTESIS: IMG2679

## 2023-08-18 MED ORDER — LIDOCAINE HCL 1 % IJ SOLN: 20.0000 mL | Freq: Once | INTRAMUSCULAR | Status: DC

## 2023-08-18 MED ORDER — LIDOCAINE HCL 1 % IJ SOLN
INTRAMUSCULAR | Status: AC
  Filled 2023-08-18: qty 20

## 2023-08-18 NOTE — Procedures (Signed)
 PROCEDURE SUMMARY:  Successful US  guided paracentesis from right lateral abdomen.  Yielded 2.1 liters of clear yellow fluid.  No immediate complications.  Patient tolerated well.  EBL = trace  Specimen not sent for labs.  Archer Vise M Stellah Donovan PA-C 08/18/2023 3:17 PM

## 2023-08-23 ENCOUNTER — Telehealth: Payer: Self-pay | Admitting: Podiatry

## 2023-08-23 NOTE — Telephone Encounter (Signed)
 DOS: 10/06/23  (LT) ENDOSCOPIC PLANTAR FASCIOTOMY -40981                                        EFFECTIVE DATE :  04/13/23  DEDUCTIBLE :  $1,500.00  REMAINING: $0.00   COINSURANCE:   30%   PER [place] OF [employer] NO PRIOR AUTH IS REQ FOR CPT CODE 19147  REF# [id number]

## 2023-08-26 ENCOUNTER — Other Ambulatory Visit: Payer: Self-pay

## 2023-09-03 ENCOUNTER — Inpatient Hospital Stay: Admission: RE | Admit: 2023-09-03 | Source: Ambulatory Visit

## 2023-09-20 ENCOUNTER — Encounter: Payer: Self-pay | Admitting: Podiatry

## 2023-09-20 ENCOUNTER — Other Ambulatory Visit: Payer: Self-pay

## 2023-09-29 ENCOUNTER — Other Ambulatory Visit: Payer: Self-pay

## 2023-10-04 ENCOUNTER — Inpatient Hospital Stay (HOSPITAL_COMMUNITY)
Admission: EM | Admit: 2023-10-04 | Discharge: 2023-10-07 | DRG: 871 | Disposition: A | Discharge status: Home / Self Care | Attending: Internal Medicine | Admitting: Internal Medicine

## 2023-10-04 ENCOUNTER — Emergency Department (HOSPITAL_COMMUNITY)

## 2023-10-04 ENCOUNTER — Encounter (HOSPITAL_COMMUNITY): Payer: Self-pay

## 2023-10-04 ENCOUNTER — Telehealth: Payer: Self-pay | Admitting: Podiatry

## 2023-10-04 DIAGNOSIS — Z7985 Long-term (current) use of injectable non-insulin antidiabetic drugs: Secondary | ICD-10-CM

## 2023-10-04 DIAGNOSIS — A0472 Enterocolitis due to Clostridium difficile, not specified as recurrent: Secondary | ICD-10-CM | POA: Diagnosis present

## 2023-10-04 DIAGNOSIS — K219 Gastro-esophageal reflux disease without esophagitis: Secondary | ICD-10-CM | POA: Diagnosis present

## 2023-10-04 DIAGNOSIS — Z634 Disappearance and death of family member: Secondary | ICD-10-CM

## 2023-10-04 DIAGNOSIS — J9601 Acute respiratory failure with hypoxia: Secondary | ICD-10-CM | POA: Diagnosis present

## 2023-10-04 DIAGNOSIS — Z8052 Family history of malignant neoplasm of bladder: Secondary | ICD-10-CM

## 2023-10-04 DIAGNOSIS — D693 Immune thrombocytopenic purpura: Secondary | ICD-10-CM | POA: Insufficient documentation

## 2023-10-04 DIAGNOSIS — N83202 Unspecified ovarian cyst, left side: Secondary | ICD-10-CM | POA: Diagnosis present

## 2023-10-04 DIAGNOSIS — G629 Polyneuropathy, unspecified: Secondary | ICD-10-CM | POA: Diagnosis not present

## 2023-10-04 DIAGNOSIS — Z8616 Personal history of COVID-19: Secondary | ICD-10-CM

## 2023-10-04 DIAGNOSIS — Z91148 Patient's other noncompliance with medication regimen for other reason: Secondary | ICD-10-CM

## 2023-10-04 DIAGNOSIS — E876 Hypokalemia: Secondary | ICD-10-CM | POA: Diagnosis present

## 2023-10-04 DIAGNOSIS — J45909 Unspecified asthma, uncomplicated: Secondary | ICD-10-CM | POA: Diagnosis present

## 2023-10-04 DIAGNOSIS — E8809 Other disorders of plasma-protein metabolism, not elsewhere classified: Secondary | ICD-10-CM | POA: Diagnosis present

## 2023-10-04 DIAGNOSIS — R6510 Systemic inflammatory response syndrome (SIRS) of non-infectious origin without acute organ dysfunction: Secondary | ICD-10-CM

## 2023-10-04 DIAGNOSIS — Z83719 Family history of colon polyps, unspecified: Secondary | ICD-10-CM

## 2023-10-04 DIAGNOSIS — J9811 Atelectasis: Secondary | ICD-10-CM | POA: Diagnosis present

## 2023-10-04 DIAGNOSIS — F4320 Adjustment disorder, unspecified: Secondary | ICD-10-CM | POA: Diagnosis present

## 2023-10-04 DIAGNOSIS — R509 Fever, unspecified: Secondary | ICD-10-CM

## 2023-10-04 DIAGNOSIS — D649 Anemia, unspecified: Secondary | ICD-10-CM | POA: Diagnosis present

## 2023-10-04 DIAGNOSIS — K7581 Nonalcoholic steatohepatitis (NASH): Secondary | ICD-10-CM | POA: Diagnosis present

## 2023-10-04 DIAGNOSIS — A419 Sepsis, unspecified organism: Principal | ICD-10-CM

## 2023-10-04 DIAGNOSIS — K8021 Calculus of gallbladder without cholecystitis with obstruction: Secondary | ICD-10-CM | POA: Diagnosis present

## 2023-10-04 DIAGNOSIS — Z794 Long term (current) use of insulin: Secondary | ICD-10-CM

## 2023-10-04 DIAGNOSIS — F411 Generalized anxiety disorder: Secondary | ICD-10-CM | POA: Diagnosis present

## 2023-10-04 DIAGNOSIS — E874 Mixed disorder of acid-base balance: Secondary | ICD-10-CM | POA: Diagnosis present

## 2023-10-04 DIAGNOSIS — E119 Type 2 diabetes mellitus without complications: Secondary | ICD-10-CM

## 2023-10-04 DIAGNOSIS — Z6835 Body mass index (BMI) 35.0-35.9, adult: Secondary | ICD-10-CM

## 2023-10-04 DIAGNOSIS — R7989 Other specified abnormal findings of blood chemistry: Secondary | ICD-10-CM | POA: Diagnosis not present

## 2023-10-04 DIAGNOSIS — F4381 Prolonged grief disorder: Secondary | ICD-10-CM | POA: Diagnosis present

## 2023-10-04 DIAGNOSIS — F32A Depression, unspecified: Secondary | ICD-10-CM | POA: Diagnosis present

## 2023-10-04 DIAGNOSIS — D61818 Other pancytopenia: Secondary | ICD-10-CM | POA: Diagnosis present

## 2023-10-04 DIAGNOSIS — R011 Cardiac murmur, unspecified: Secondary | ICD-10-CM | POA: Diagnosis not present

## 2023-10-04 DIAGNOSIS — K7460 Unspecified cirrhosis of liver: Secondary | ICD-10-CM | POA: Diagnosis present

## 2023-10-04 DIAGNOSIS — L270 Generalized skin eruption due to drugs and medicaments taken internally: Secondary | ICD-10-CM | POA: Diagnosis not present

## 2023-10-04 DIAGNOSIS — T368X5A Adverse effect of other systemic antibiotics, initial encounter: Secondary | ICD-10-CM | POA: Diagnosis not present

## 2023-10-04 DIAGNOSIS — R188 Other ascites: Secondary | ICD-10-CM

## 2023-10-04 DIAGNOSIS — Z7984 Long term (current) use of oral hypoglycemic drugs: Secondary | ICD-10-CM

## 2023-10-04 DIAGNOSIS — A045 Campylobacter enteritis: Secondary | ICD-10-CM | POA: Diagnosis present

## 2023-10-04 DIAGNOSIS — Z9079 Acquired absence of other genital organ(s): Secondary | ICD-10-CM

## 2023-10-04 DIAGNOSIS — Z79899 Other long term (current) drug therapy: Secondary | ICD-10-CM

## 2023-10-04 DIAGNOSIS — Z818 Family history of other mental and behavioral disorders: Secondary | ICD-10-CM

## 2023-10-04 DIAGNOSIS — D6959 Other secondary thrombocytopenia: Secondary | ICD-10-CM | POA: Diagnosis present

## 2023-10-04 DIAGNOSIS — Z888 Allergy status to other drugs, medicaments and biological substances status: Secondary | ICD-10-CM

## 2023-10-04 DIAGNOSIS — F418 Other specified anxiety disorders: Secondary | ICD-10-CM | POA: Diagnosis present

## 2023-10-04 DIAGNOSIS — G9341 Metabolic encephalopathy: Secondary | ICD-10-CM | POA: Diagnosis present

## 2023-10-04 DIAGNOSIS — K7682 Hepatic encephalopathy: Secondary | ICD-10-CM | POA: Diagnosis present

## 2023-10-04 DIAGNOSIS — Z87440 Personal history of urinary (tract) infections: Secondary | ICD-10-CM

## 2023-10-04 DIAGNOSIS — K7290 Hepatic failure, unspecified without coma: Secondary | ICD-10-CM | POA: Diagnosis not present

## 2023-10-04 DIAGNOSIS — E7220 Disorder of urea cycle metabolism, unspecified: Secondary | ICD-10-CM

## 2023-10-04 DIAGNOSIS — Z880 Allergy status to penicillin: Secondary | ICD-10-CM

## 2023-10-04 DIAGNOSIS — I5031 Acute diastolic (congestive) heart failure: Secondary | ICD-10-CM | POA: Diagnosis present

## 2023-10-04 DIAGNOSIS — Z825 Family history of asthma and other chronic lower respiratory diseases: Secondary | ICD-10-CM

## 2023-10-04 DIAGNOSIS — Z881 Allergy status to other antibiotic agents status: Secondary | ICD-10-CM

## 2023-10-04 DIAGNOSIS — Z90710 Acquired absence of both cervix and uterus: Secondary | ICD-10-CM

## 2023-10-04 DIAGNOSIS — Z713 Dietary counseling and surveillance: Secondary | ICD-10-CM

## 2023-10-04 DIAGNOSIS — E1142 Type 2 diabetes mellitus with diabetic polyneuropathy: Secondary | ICD-10-CM | POA: Diagnosis present

## 2023-10-04 DIAGNOSIS — R0602 Shortness of breath: Principal | ICD-10-CM

## 2023-10-04 DIAGNOSIS — Z1152 Encounter for screening for COVID-19: Secondary | ICD-10-CM

## 2023-10-04 DIAGNOSIS — Z87442 Personal history of urinary calculi: Secondary | ICD-10-CM

## 2023-10-04 DIAGNOSIS — K529 Noninfective gastroenteritis and colitis, unspecified: Secondary | ICD-10-CM

## 2023-10-04 DIAGNOSIS — Z7901 Long term (current) use of anticoagulants: Secondary | ICD-10-CM

## 2023-10-04 DIAGNOSIS — Z8261 Family history of arthritis: Secondary | ICD-10-CM

## 2023-10-04 DIAGNOSIS — Z833 Family history of diabetes mellitus: Secondary | ICD-10-CM

## 2023-10-04 DIAGNOSIS — Y92239 Unspecified place in hospital as the place of occurrence of the external cause: Secondary | ICD-10-CM | POA: Diagnosis not present

## 2023-10-04 DIAGNOSIS — Z90722 Acquired absence of ovaries, bilateral: Secondary | ICD-10-CM

## 2023-10-04 DIAGNOSIS — R0902 Hypoxemia: Secondary | ICD-10-CM

## 2023-10-04 DIAGNOSIS — F909 Attention-deficit hyperactivity disorder, unspecified type: Secondary | ICD-10-CM | POA: Diagnosis present

## 2023-10-04 DIAGNOSIS — E66812 Obesity, class 2: Secondary | ICD-10-CM | POA: Diagnosis present

## 2023-10-04 DIAGNOSIS — Z91040 Latex allergy status: Secondary | ICD-10-CM

## 2023-10-04 HISTORY — DX: Sepsis, unspecified organism: A41.9

## 2023-10-04 LAB — I-STAT VENOUS BLOOD GAS, ED
Acid-base deficit: 2 mmol/L (ref 0.0–2.0)
Bicarbonate: 19.5 mmol/L — ABNORMAL LOW (ref 20.0–28.0)
Calcium, Ion: 1.03 mmol/L — ABNORMAL LOW (ref 1.15–1.40)
HCT: 27 % — ABNORMAL LOW (ref 36.0–46.0)
Hemoglobin: 9.2 g/dL — ABNORMAL LOW (ref 12.0–15.0)
O2 Saturation: 99 %
Potassium: 3.9 mmol/L (ref 3.5–5.1)
Sodium: 137 mmol/L (ref 135–145)
TCO2: 20 mmol/L — ABNORMAL LOW (ref 22–32)
pCO2, Ven: 22.5 mmHg — ABNORMAL LOW (ref 44–60)
pH, Ven: 7.545 — ABNORMAL HIGH (ref 7.25–7.43)
pO2, Ven: 99 mmHg — ABNORMAL HIGH (ref 32–45)

## 2023-10-04 LAB — COMPREHENSIVE METABOLIC PANEL WITH GFR
ALT: 31 U/L (ref 0–44)
AST: 58 U/L — ABNORMAL HIGH (ref 15–41)
Albumin: 2.4 g/dL — ABNORMAL LOW (ref 3.5–5.0)
Alkaline Phosphatase: 85 U/L (ref 38–126)
Anion gap: 8 (ref 5–15)
BUN: 11 mg/dL (ref 6–20)
CO2: 20 mmol/L — ABNORMAL LOW (ref 22–32)
Calcium: 8.2 mg/dL — ABNORMAL LOW (ref 8.9–10.3)
Chloride: 107 mmol/L (ref 98–111)
Creatinine, Ser: 0.89 mg/dL (ref 0.44–1.00)
GFR, Estimated: >60 mL/min (ref >60)
Glucose, Bld: 213 mg/dL — ABNORMAL HIGH (ref 70–99)
Potassium: 4 mmol/L (ref 3.5–5.1)
Sodium: 135 mmol/L (ref 135–145)
Total Bilirubin: 2.7 mg/dL — ABNORMAL HIGH (ref 0.0–1.2)
Total Protein: 6.4 g/dL — ABNORMAL LOW (ref 6.5–8.1)

## 2023-10-04 LAB — CBC WITH DIFFERENTIAL/PLATELET
Abs Immature Granulocytes: 0.01 10*3/uL (ref 0.00–0.07)
Basophils Absolute: 0 10*3/uL (ref 0.0–0.1)
Basophils Relative: 1 %
Eosinophils Absolute: 0.1 10*3/uL (ref 0.0–0.5)
Eosinophils Relative: 3 %
HCT: 31.3 % — ABNORMAL LOW (ref 36.0–46.0)
Hemoglobin: 10.7 g/dL — ABNORMAL LOW (ref 12.0–15.0)
Immature Granulocytes: 0 %
Lymphocytes Relative: 7 %
Lymphs Abs: 0.3 10*3/uL — ABNORMAL LOW (ref 0.7–4.0)
MCH: 31.9 pg (ref 26.0–34.0)
MCHC: 34.2 g/dL (ref 30.0–36.0)
MCV: 93.4 fL (ref 80.0–100.0)
Monocytes Absolute: 0.4 10*3/uL (ref 0.1–1.0)
Monocytes Relative: 9 %
Neutro Abs: 3.5 10*3/uL (ref 1.7–7.7)
Neutrophils Relative %: 80 %
Platelets: 69 10*3/uL — ABNORMAL LOW (ref 150–400)
RBC: 3.35 MIL/uL — ABNORMAL LOW (ref 3.87–5.11)
RDW: 14.6 % (ref 11.5–15.5)
WBC: 4.4 10*3/uL (ref 4.0–10.5)
nRBC: 0 % (ref 0.0–0.2)

## 2023-10-04 LAB — CBG MONITORING, ED: Glucose-Capillary: 173 mg/dL — ABNORMAL HIGH (ref 70–99)

## 2023-10-04 LAB — PROTIME-INR
INR: 1.5 — ABNORMAL HIGH (ref 0.8–1.2)
Prothrombin Time: 18.3 s — ABNORMAL HIGH (ref 11.4–15.2)

## 2023-10-04 LAB — BILIRUBIN, DIRECT: Bilirubin, Direct: 0.8 mg/dL — ABNORMAL HIGH (ref 0.0–0.2)

## 2023-10-04 LAB — AMMONIA: Ammonia: 89 umol/L — ABNORMAL HIGH (ref 9–35)

## 2023-10-04 LAB — I-STAT CG4 LACTIC ACID, ED: Lactic Acid, Venous: 1.7 mmol/L (ref 0.5–1.9)

## 2023-10-04 LAB — RESP PANEL BY RT-PCR (RSV, FLU A&B, COVID)  RVPGX2
Influenza A by PCR: NEGATIVE
Influenza B by PCR: NEGATIVE
Resp Syncytial Virus by PCR: NEGATIVE
SARS Coronavirus 2 by RT PCR: NEGATIVE

## 2023-10-04 LAB — HCG, SERUM, QUALITATIVE: Preg, Serum: NEGATIVE

## 2023-10-04 MED ORDER — DIPHENHYDRAMINE HCL 50 MG/ML IJ SOLN
25.0000 mg | INTRAMUSCULAR | Status: AC
  Administered 2023-10-04: 25 mg via INTRAVENOUS
  Filled 2023-10-04: qty 1

## 2023-10-04 MED ORDER — ALBUTEROL SULFATE (2.5 MG/3ML) 0.083% IN NEBU: 2.5000 mg | INHALATION_SOLUTION | Freq: Four times a day (QID) | RESPIRATORY_TRACT | Status: DC | PRN

## 2023-10-04 MED ORDER — INSULIN ASPART 100 UNIT/ML IJ SOLN
0.0000 [IU] | Freq: Three times a day (TID) | INTRAMUSCULAR | Status: DC
  Administered 2023-10-05 (×2): 20 [IU] via SUBCUTANEOUS

## 2023-10-04 MED ORDER — PANTOPRAZOLE SODIUM 40 MG PO TBEC
40.0000 mg | DELAYED_RELEASE_TABLET | Freq: Every day | ORAL | Status: DC
  Administered 2023-10-05 – 2023-10-07 (×3): 40 mg via ORAL
  Filled 2023-10-04 (×3): qty 1

## 2023-10-04 MED ORDER — SODIUM CHLORIDE 0.9 % IV SOLN
2.0000 g | Freq: Once | INTRAVENOUS | Status: AC
  Administered 2023-10-04: 2 g via INTRAVENOUS
  Filled 2023-10-04: qty 20

## 2023-10-04 MED ORDER — SODIUM CHLORIDE 0.9 % IV SOLN
2.0000 g | Freq: Three times a day (TID) | INTRAVENOUS | Status: DC
  Administered 2023-10-05 (×2): 2 g via INTRAVENOUS
  Filled 2023-10-04 (×2): qty 12.5

## 2023-10-04 MED ORDER — FLUOXETINE HCL 20 MG PO CAPS
40.0000 mg | ORAL_CAPSULE | Freq: Two times a day (BID) | ORAL | Status: DC
  Administered 2023-10-05 – 2023-10-07 (×6): 40 mg via ORAL
  Filled 2023-10-04 (×6): qty 2

## 2023-10-04 MED ORDER — ALBUMIN HUMAN 25 % IV SOLN
50.0000 g | INTRAVENOUS | Status: AC
  Administered 2023-10-05: 50 g via INTRAVENOUS
  Filled 2023-10-04: qty 200

## 2023-10-04 MED ORDER — SODIUM CHLORIDE 0.9 % IV SOLN: 250.0000 mL | INTRAVENOUS | Status: AC | PRN

## 2023-10-04 MED ORDER — METHYLPREDNISOLONE SODIUM SUCC 125 MG IJ SOLR
125.0000 mg | INTRAMUSCULAR | Status: AC
  Administered 2023-10-04: 125 mg via INTRAVENOUS
  Filled 2023-10-04: qty 2

## 2023-10-04 MED ORDER — METRONIDAZOLE 500 MG/100ML IV SOLN
500.0000 mg | Freq: Two times a day (BID) | INTRAVENOUS | Status: DC
  Administered 2023-10-05: 500 mg via INTRAVENOUS
  Filled 2023-10-04: qty 100

## 2023-10-04 MED ORDER — ACETAMINOPHEN 650 MG RE SUPP: 650.0000 mg | Freq: Four times a day (QID) | RECTAL | Status: DC | PRN

## 2023-10-04 MED ORDER — MONTELUKAST SODIUM 10 MG PO TABS
10.0000 mg | ORAL_TABLET | Freq: Every day | ORAL | Status: DC
  Administered 2023-10-05 – 2023-10-06 (×3): 10 mg via ORAL
  Filled 2023-10-04 (×3): qty 1

## 2023-10-04 MED ORDER — VANCOMYCIN HCL 750 MG/150ML IV SOLN: 750.0000 mg | Freq: Two times a day (BID) | INTRAVENOUS | Status: DC

## 2023-10-04 MED ORDER — ONDANSETRON HCL 4 MG PO TABS: 4.0000 mg | ORAL_TABLET | Freq: Four times a day (QID) | ORAL | Status: DC | PRN

## 2023-10-04 MED ORDER — VANCOMYCIN HCL 2000 MG/400ML IV SOLN
2000.0000 mg | Freq: Once | INTRAVENOUS | Status: AC
  Administered 2023-10-04: 2000 mg via INTRAVENOUS
  Filled 2023-10-04: qty 400

## 2023-10-04 MED ORDER — LACTULOSE 10 GM/15ML PO SOLN
30.0000 g | Freq: Three times a day (TID) | ORAL | Status: DC
  Administered 2023-10-05 (×2): 30 g via ORAL
  Filled 2023-10-04: qty 60
  Filled 2023-10-04 (×2): qty 45

## 2023-10-04 MED ORDER — ACETAMINOPHEN 500 MG PO TABS
500.0000 mg | ORAL_TABLET | Freq: Once | ORAL | Status: AC
  Administered 2023-10-04: 500 mg via ORAL
  Filled 2023-10-04: qty 1

## 2023-10-04 MED ORDER — SODIUM CHLORIDE 0.9 % IV BOLUS
250.0000 mL | Freq: Once | INTRAVENOUS | Status: AC
  Administered 2023-10-04: 250 mL via INTRAVENOUS

## 2023-10-04 MED ORDER — LACTATED RINGERS IV BOLUS
250.0000 mL | Freq: Once | INTRAVENOUS | Status: AC
  Administered 2023-10-04: 250 mL via INTRAVENOUS

## 2023-10-04 MED ORDER — ACETAMINOPHEN 325 MG PO TABS
650.0000 mg | ORAL_TABLET | Freq: Four times a day (QID) | ORAL | Status: DC | PRN
  Administered 2023-10-06 (×2): 650 mg via ORAL
  Filled 2023-10-04 (×3): qty 2

## 2023-10-04 MED ORDER — SODIUM CHLORIDE 0.9% FLUSH
3.0000 mL | Freq: Two times a day (BID) | INTRAVENOUS | Status: DC
  Administered 2023-10-05 – 2023-10-06 (×4): 3 mL via INTRAVENOUS

## 2023-10-04 MED ORDER — ONDANSETRON HCL 4 MG/2ML IJ SOLN: 4.0000 mg | Freq: Four times a day (QID) | INTRAMUSCULAR | Status: DC | PRN

## 2023-10-04 MED ORDER — SODIUM CHLORIDE 0.9% FLUSH
3.0000 mL | Freq: Two times a day (BID) | INTRAVENOUS | Status: DC
  Administered 2023-10-05: 3 mL via INTRAVENOUS

## 2023-10-04 MED ORDER — SODIUM CHLORIDE 0.9% FLUSH: 3.0000 mL | INTRAVENOUS | Status: DC | PRN

## 2023-10-04 MED ORDER — IOHEXOL 350 MG/ML SOLN
75.0000 mL | Freq: Once | INTRAVENOUS | Status: AC | PRN
  Administered 2023-10-04: 75 mL via INTRAVENOUS

## 2023-10-04 MED ORDER — INSULIN ASPART 100 UNIT/ML IJ SOLN: 0.0000 [IU] | Freq: Every day | INTRAMUSCULAR | Status: DC

## 2023-10-04 MED ORDER — MIDODRINE HCL 5 MG PO TABS
10.0000 mg | ORAL_TABLET | Freq: Once | ORAL | Status: AC
  Administered 2023-10-05: 10 mg via ORAL
  Filled 2023-10-04: qty 2

## 2023-10-04 NOTE — Progress Notes (Signed)
 Pharmacy Antibiotic Note  Laurie Sutton is a 43 y.o. female admitted on 10/04/2023 with shortness of breath with concerns for intra-abdominal infection and endocarditis given recent tooth extraction (no antibiotics given prior per patient).  Pharmacy has been consulted for zosyn/vancomycin dosing.  -Tmax 100.5, sCr 0.89 (bl ~0.5) -Blood cultures collected   Plan: -Cefepime 2g IV every 8 hours -Flagyl 500mg  IV every 12 hours  -Vancomycin 2g IV x1 -Vancomycin 750mg  IV every 12 hours (AUC 439, Vd 0.5, IBW, sCr 0.89) -Monitor renal function -Follow up signs of clinical improvement, LOT, de-escalation of antibiotics   Height: 5' 6 (167.6 cm) Weight: 99.8 kg (220 lb) IBW/kg (Calculated) : 59.3  Temp (24hrs), Avg:100.5 F (38.1 C), Min:100.5 F (38.1 C), Max:100.5 F (38.1 C)  Recent Labs  Lab 10/04/23 2002 10/04/23 2010  WBC 4.4  --   CREATININE 0.89  --   LATICACIDVEN  --  1.7    Estimated Creatinine Clearance: 98.1 mL/min (by C-G formula based on SCr of 0.89 mg/dL).    Allergies  Allergen Reactions   Amoxicillin Hives and Itching   Azithromycin      DOES NOT WORK   Dulaglutide  Other (See Comments)    JANUVIA  STOAMCH HURT?  Other Reaction(s): constipation and stomach issues    JANUVIA  STOAMCH HURT?   Empagliflozin -Metformin  Hcl Er Other (See Comments) and Swelling   Januvia  [Sitagliptin ] Other (See Comments)    HURTS STOAMCH   Latex     IRRITATES VAGINAL AREA   Metformin      Other Reaction(s): achy all over   Penicillins Hives    Has patient had a PCN reaction causing immediate rash, facial/tongue/throat swelling, SOB or lightheadedness with hypotension: yes. Rash  Has patient had a PCN reaction causing severe rash involving mucus membranes or skin necrosis: Yes- rash and hives all over body  Has patient had a PCN reaction that required hospitalization No Has patient had a PCN reaction occurring within the last 10 years: Yes  If all of the above answers are  NO, then may proceed with Cephalosporin use.     Antimicrobials this admission: Ceftriaxone x1  Vancomycin 6/24 >>  Cefepime 6/25 >> Flagyl 6/25 >>  Microbiology results: 6/24 BCx:   Thank you for allowing pharmacy to be a part of this patient's care.  Lynwood Poplar, PharmD, BCPS Clinical Pharmacist 10/04/2023 11:04 PM

## 2023-10-04 NOTE — ED Provider Notes (Signed)
 [place] EMERGENCY DEPARTMENT AT [facility] Provider Note   CSN: [id number] Arrival date & time: 10/04/23  1935     Patient presents with: Shortness of Breath   Laurie Sutton is a 43 y.o. female.   HPI    43 year old female with medical history significant for [name], DMT2, obesity, esophageal varices, portal hypertensive gastropathy, frequent urinary tract infections, depression, GERD presenting to the emergency department with cough, shortness of breath, chills, generalized weakness and abdominal discomfort.  The patient states that she has been off of her spironolactone and Lasix  for the past week due to having plantar fasciitis surgery.  She also states that 1 week ago she had a tooth extraction.  She has not been on any antibiotics after this tooth extraction.  She endorses chills and fatigue.  She does state that she has a history of a cardiac murmur, and denies any active chest pain.  She endorses mild generalized abdominal discomfort.  She is moving her bowels and had a last bowel movement today.  She endorses some dysuria. Prior to Admission medications   Medication Sig Start Date End Date Taking? Authorizing Provider  albuterol  (VENTOLIN  HFA) 108 (90 Base) MCG/ACT inhaler INHALE 2 PUFFS INTO THE LUNGS UP TO EVERY 6 HOURS AS NEEDED FOR WHEEZING/SHORTNESS OF BREATH 08/20/22   [name], MD  clotrimazole -betamethasone  (LOTRISONE ) cream APPLY 1 APPLICATION TOPICALLY TWICE A DAY AS NEEDED 05/26/23   [name], MD  colchicine  0.6 MG tablet Take 1 tablet (0.6 mg total) by mouth daily. Patient not taking: Reported on 07/19/2023 06/28/23   Janit Thresa HERO, DPM  Continuous Blood Gluc Sensor (FREESTYLE LIBRE 14 DAY SENSOR) MISC Apply topically every 14 (fourteen) days. Patient not taking: Reported on 07/19/2023 12/26/19   [provider]  Continuous Glucose Sensor (DEXCOM G7 SENSOR) MISC Use 1 sensor for continuous glucose monitoring every 10 days for 30  days 05/25/23   Braulio Hough, MD  Continuous Glucose Sensor (FREESTYLE LIBRE 3 PLUS SENSOR) MISC Change every 15 days. Patient not taking: Reported on 07/19/2023 05/23/23     Continuous Glucose Sensor (FREESTYLE LIBRE 3 SENSOR) MISC Apply sensor to upper back of arm, change sensor every 14 days. Patient not taking: Reported on 07/19/2023 12/30/22   Braulio Hough, MD  Cranberry 125 MG TABS Take by mouth 2 (two) times daily. Patient not taking: Reported on 06/28/2023    [provider]  doxycycline  (VIBRA -TABS) 100 MG tablet Take 1 tablet (100 mg total) by mouth 2 (two) times daily. Patient not taking: Reported on 07/19/2023 06/28/23   Janit Thresa HERO, DPM  FLUoxetine  (PROZAC ) 40 MG capsule TAKE 1 CAPSULE BY MOUTH TWICE A DAY 07/26/23   [name], MD  furosemide  (LASIX ) 20 MG tablet Take 2 tablets by mouth daily. 06/29/23 06/28/24  [provider]  gabapentin  (NEURONTIN ) 100 MG capsule Take 1 capsule (100 mg total) by mouth 2 (two) times daily. Patient not taking: Reported on 07/19/2023 11/08/22   [name], MD  HUMULIN  R U-500 KWIKPEN 500 UNIT/ML KwikPen Use 120 units at breakfast, 100 at lunch, and 100 at dinner Patient taking differently: Use 60 units at breakfast, 65 at lunch, and 65 at dinner 04/08/22   [name], MD  lactulose, encephalopathy, (CHRONULAC) 10 GM/15ML SOLN Take by mouth. 07/12/23 07/11/24  [provider]  LINZESS  145 MCG CAPS capsule TAKE 1 CAPSULE BY MOUTH DAILY BEFORE BREAKFAST. Patient not taking: Reported on 07/19/2023 03/17/23   Danis,  Victory LITTIE MOULD, MD  LORazepam  (ATIVAN ) 0.5 MG tablet Take 1 tablet (0.5 mg total) by mouth 2 (two) times daily as needed for anxiety. Patient not taking: Reported on 06/28/2023 12/29/22   [name], MD  montelukast  (SINGULAIR ) 10 MG tablet Take 1 tablet (10 mg total) by mouth at bedtime. 08/25/22   [name], MD  mupirocin  ointment (BACTROBAN ) 2 % Apply 1 Application topically 2 (two) times daily. 06/28/23   Janit Thresa HERO, DPM  nortriptyline  (PAMELOR ) 10 MG capsule Take 3 capsules (30 mg total) by mouth at bedtime. Patient not taking: Reported on 06/28/2023 02/23/23   Gayland Lauraine PARAS, NP  omeprazole  (PRILOSEC) 40 MG capsule TAKE 1 CAPSULE (40 MG TOTAL) BY MOUTH IN THE MORNING 07/25/23   Legrand Victory LITTIE MOULD, MD  ondansetron  (ZOFRAN -ODT) 4 MG disintegrating tablet Take 1 tablet (4 mg total) by mouth every 8 (eight) hours as needed for nausea or vomiting. 02/09/23   Becki Krabbe, FNP  propranolol  (INDERAL ) 60 MG tablet Take 1 tablet (60 mg total) by mouth 2 (two) times daily. 02/23/23   Gayland Lauraine PARAS, NP  Sodium Sulfate-Mag Sulfate-KCl (SUTAB ) [phone] MG TABS Use as directed for colonoscopy. MANUFACTURER CODES!! BIN: [id number] PCN: CN GROUP: [id number] MEMBER ID: [id number];MLW AS SECONDARY INSURANCE ;NO PRIOR AUTHORIZATION 07/29/23   Kerman Vina HERO, NP  tirzepatide  (MOUNJARO ) 10 MG/0.5ML Pen Inject 10 mg into the skin once a week. 02/16/23     tirzepatide  (MOUNJARO ) 10 MG/0.5ML Pen Inject 10 mg into the skin once a week. 05/23/23     tirzepatide  (MOUNJARO ) 7.5 MG/0.5ML Pen Inject 7.5 mg Subcutaneous once a week 30 days 07/06/23       Allergies: Amoxicillin, Azithromycin , Dulaglutide , Empagliflozin -metformin  hcl er, Januvia  [sitagliptin ], Latex, Metformin , and Penicillins    Review of Systems  All other systems reviewed and are negative.   Updated Vital Signs BP (!) 100/52   Pulse 78   Temp (!) 100.5 F (38.1 C) (Oral)   Resp (!) 21   Ht 5' 6 (1.676 m)   Wt 99.8 kg   LMP 11/13/2020 (Exact Date)   SpO2 98%   BMI 35.51 kg/m   Physical Exam Vitals and nursing note reviewed.  Constitutional:      General: She is not in acute distress.    Appearance: She is well-developed. She is obese.     Comments: GCS 14  HENT:     Head: Normocephalic and atraumatic.   Eyes:     Conjunctiva/sclera: Conjunctivae normal.    Cardiovascular:     Rate and Rhythm: Normal rate and regular rhythm.      Pulses: Normal pulses.     Heart sounds: Murmur heard.  Pulmonary:     Effort: Pulmonary effort is normal. No respiratory distress.     Breath sounds: Normal breath sounds.  Abdominal:     General: There is distension.     Palpations: Abdomen is soft.     Tenderness: There is no abdominal tenderness.     Comments: Abdomen is distended, generally nontender in all 4 quadrants   Musculoskeletal:        General: No swelling.     Cervical back: Normal range of motion and neck supple. No rigidity.     Right lower leg: No edema.     Left lower leg: No edema.   Skin:    General: Skin is warm and dry.     Capillary Refill: Capillary refill takes less than 2 seconds.  Findings: No rash.   Neurological:     General: No focal deficit present.     Mental Status: She is alert and oriented to person, place, and time. Mental status is at baseline.   Psychiatric:        Mood and Affect: Mood normal.     (all labs ordered are listed, but only abnormal results are displayed) Labs Reviewed  COMPREHENSIVE METABOLIC PANEL WITH GFR - Abnormal; Notable for the following components:      Result Value   CO2 20 (*)    Glucose, Bld 213 (*)    Calcium 8.2 (*)    Total Protein 6.4 (*)    Albumin 2.4 (*)    AST 58 (*)    Total Bilirubin 2.7 (*)    All other components within normal limits  CBC WITH DIFFERENTIAL/PLATELET - Abnormal; Notable for the following components:   RBC 3.35 (*)    Hemoglobin 10.7 (*)    HCT 31.3 (*)    Platelets 69 (*)    Lymphs Abs 0.3 (*)    All other components within normal limits  PROTIME-INR - Abnormal; Notable for the following components:   Prothrombin Time 18.3 (*)    INR 1.5 (*)    All other components within normal limits  BILIRUBIN, DIRECT - Abnormal; Notable for the following components:   Bilirubin, Direct 0.8 (*)    All other components within normal limits  AMMONIA - Abnormal; Notable for the following components:   Ammonia 89 (*)    All other  components within normal limits  I-STAT VENOUS BLOOD GAS, ED - Abnormal; Notable for the following components:   pH, Ven 7.545 (*)    pCO2, Ven 22.5 (*)    pO2, Ven 99 (*)    Bicarbonate 19.5 (*)    TCO2 20 (*)    Calcium, Ion 1.03 (*)    HCT 27.0 (*)    Hemoglobin 9.2 (*)    All other components within normal limits  RESP PANEL BY RT-PCR (RSV, FLU A&B, COVID)  RVPGX2  CULTURE, BLOOD (ROUTINE X 2)  CULTURE, BLOOD (ROUTINE X 2)  RESPIRATORY PANEL BY PCR  HCG, SERUM, QUALITATIVE  URINALYSIS, W/ REFLEX TO CULTURE (INFECTION SUSPECTED)  RAPID URINE DRUG SCREEN, HOSP PERFORMED  I-STAT CG4 LACTIC ACID, ED    EKG: EKG Interpretation Date/Time:  Tuesday October 04 2023 19:43:44 EDT Ventricular Rate:  86 PR Interval:  116 QRS Duration:  88 QT Interval:  374 QTC Calculation: 448 R Axis:   -21  Text Interpretation: Sinus rhythm Borderline short PR interval Left ventricular hypertrophy Confirmed by Jerrol Agent ([id number]) on 10/04/2023 8:42:08 PM  Radiology: CT Angio Chest PE W and/or Wo Contrast Result Date: 10/04/2023 CLINICAL DATA:  High probability for PE. Non alcoholic cirrhosis. Chills and weakness. Acute abdominal pain. EXAM: CT ANGIOGRAPHY CHEST CT ABDOMEN AND PELVIS WITH CONTRAST TECHNIQUE: Multidetector CT imaging of the chest was performed using the standard protocol during bolus administration of intravenous contrast. Multiplanar CT image reconstructions and MIPs were obtained to evaluate the vascular anatomy. Multidetector CT imaging of the abdomen and pelvis was performed using the standard protocol during bolus administration of intravenous contrast. RADIATION DOSE REDUCTION: This exam was performed according to the departmental dose-optimization program which includes automated exposure control, adjustment of the mA and/or kV according to patient size and/or use of iterative reconstruction technique. CONTRAST:  75mL OMNIPAQUE  IOHEXOL  350 MG/ML SOLN COMPARISON:  CT abdomen and pelvis  06/06/2023 FINDINGS: CTA CHEST FINDINGS  Cardiovascular: Satisfactory opacification of the pulmonary arteries to the segmental level. No evidence of pulmonary embolism. Heart is borderline enlarged/mildly enlarged. No pericardial effusion. Mediastinum/Nodes: No enlarged mediastinal, hilar, or axillary lymph nodes. Thyroid  gland, trachea, and esophagus demonstrate no significant findings. Lungs/Pleura: There is mild atelectasis in the bilateral lower lobes and lingula. There is no pleural effusion or pneumothorax. Musculoskeletal: No chest wall abnormality. No acute or significant osseous findings. Review of the MIP images confirms the above findings. CT ABDOMEN and PELVIS FINDINGS Hepatobiliary: There is nodular liver contour, unchanged. Gallstones are present. There is no biliary ductal dilatation. Pancreas: Unremarkable. No pancreatic ductal dilatation or surrounding inflammatory changes. Spleen: Spleen is enlarged measuring up to 18 cm similar to prior. Adrenals/Urinary Tract: There are 2 subcentimeter hypodensities in the left kidney which are too small to characterize, likely cysts. Otherwise, the kidneys, adrenal glands and bladder are within normal limits. Stomach/Bowel: There is diffuse wall thickening of the ascending colon, transverse colon and descending colon. The appendix is within normal limits. No bowel obstruction, pneumatosis or free air visualized. Stomach is within normal limits. Vascular/Lymphatic: Aorta and IVC are normal in size. Splenic varices are present. No enlarged lymph nodes are identified. Reproductive: 4.8 cm cyst with hyperdense fluid fluid level noted in the left ovary. Right ovary is within normal limits. The uterus is surgically absent. Other: There is large volume ascites. There is no focal abdominal wall hernia or free air. Musculoskeletal: No fracture is seen. Review of the MIP images confirms the above findings. IMPRESSION: 1. No evidence for pulmonary embolism. 2. Mild  bibasilar atelectasis. 3. Cirrhotic liver with sequela of portal hypertension including splenomegaly, varices and large volume ascites. 4. Diffuse wall thickening of the ascending, transverse and descending colon worrisome for colitis. 5. 4.8 cm left ovarian hemorrhagic cyst. This can be further evaluated with pelvic ultrasound. 6. Cholelithiasis. Electronically Signed   By: Greig Pique M.D.   On: 10/04/2023 21:47   CT ABDOMEN PELVIS W CONTRAST Result Date: 10/04/2023 CLINICAL DATA:  High probability for PE. Non alcoholic cirrhosis. Chills and weakness. Acute abdominal pain. EXAM: CT ANGIOGRAPHY CHEST CT ABDOMEN AND PELVIS WITH CONTRAST TECHNIQUE: Multidetector CT imaging of the chest was performed using the standard protocol during bolus administration of intravenous contrast. Multiplanar CT image reconstructions and MIPs were obtained to evaluate the vascular anatomy. Multidetector CT imaging of the abdomen and pelvis was performed using the standard protocol during bolus administration of intravenous contrast. RADIATION DOSE REDUCTION: This exam was performed according to the departmental dose-optimization program which includes automated exposure control, adjustment of the mA and/or kV according to patient size and/or use of iterative reconstruction technique. CONTRAST:  75mL OMNIPAQUE  IOHEXOL  350 MG/ML SOLN COMPARISON:  CT abdomen and pelvis 06/06/2023 FINDINGS: CTA CHEST FINDINGS Cardiovascular: Satisfactory opacification of the pulmonary arteries to the segmental level. No evidence of pulmonary embolism. Heart is borderline enlarged/mildly enlarged. No pericardial effusion. Mediastinum/Nodes: No enlarged mediastinal, hilar, or axillary lymph nodes. Thyroid  gland, trachea, and esophagus demonstrate no significant findings. Lungs/Pleura: There is mild atelectasis in the bilateral lower lobes and lingula. There is no pleural effusion or pneumothorax. Musculoskeletal: No chest wall abnormality. No acute or  significant osseous findings. Review of the MIP images confirms the above findings. CT ABDOMEN and PELVIS FINDINGS Hepatobiliary: There is nodular liver contour, unchanged. Gallstones are present. There is no biliary ductal dilatation. Pancreas: Unremarkable. No pancreatic ductal dilatation or surrounding inflammatory changes. Spleen: Spleen is enlarged measuring up to 18 cm similar to prior. Adrenals/Urinary  Tract: There are 2 subcentimeter hypodensities in the left kidney which are too small to characterize, likely cysts. Otherwise, the kidneys, adrenal glands and bladder are within normal limits. Stomach/Bowel: There is diffuse wall thickening of the ascending colon, transverse colon and descending colon. The appendix is within normal limits. No bowel obstruction, pneumatosis or free air visualized. Stomach is within normal limits. Vascular/Lymphatic: Aorta and IVC are normal in size. Splenic varices are present. No enlarged lymph nodes are identified. Reproductive: 4.8 cm cyst with hyperdense fluid fluid level noted in the left ovary. Right ovary is within normal limits. The uterus is surgically absent. Other: There is large volume ascites. There is no focal abdominal wall hernia or free air. Musculoskeletal: No fracture is seen. Review of the MIP images confirms the above findings. IMPRESSION: 1. No evidence for pulmonary embolism. 2. Mild bibasilar atelectasis. 3. Cirrhotic liver with sequela of portal hypertension including splenomegaly, varices and large volume ascites. 4. Diffuse wall thickening of the ascending, transverse and descending colon worrisome for colitis. 5. 4.8 cm left ovarian hemorrhagic cyst. This can be further evaluated with pelvic ultrasound. 6. Cholelithiasis. Electronically Signed   By: Greig Pique M.D.   On: 10/04/2023 21:47   DG Chest 2 View Result Date: 10/04/2023 CLINICAL DATA:  Possible sepsis EXAM: CHEST - 2 VIEW COMPARISON:  04/14/2022 FINDINGS: Cardiac shadow is within  normal limits. Minimal left basilar atelectasis is noted. No bony abnormality is seen. IMPRESSION: Minimal left basilar atelectasis. Electronically Signed   By: Oneil Devonshire M.D.   On: 10/04/2023 20:47     .Critical Care  Performed by: Jerrol Agent, MD Authorized by: Jerrol Agent, MD   Critical care provider statement:    Critical care time (minutes):  30   Critical care was time spent personally by me on the following activities:  Development of treatment plan with patient or surrogate, discussions with consultants, evaluation of patient's response to treatment, examination of patient, ordering and review of laboratory studies, ordering and review of radiographic studies, ordering and performing treatments and interventions, pulse oximetry, re-evaluation of patient's condition and review of old charts   Care discussed with: admitting provider      Medications Ordered in the ED  vancomycin (VANCOREADY) IVPB 2000 mg/400 mL (2,000 mg Intravenous New Bag/Given 10/04/23 2200)  albumin human 25 % solution 50 g (has no administration in time range)  midodrine (PROAMATINE) tablet 10 mg (has no administration in time range)  sodium chloride  0.9 % bolus 250 mL (0 mLs Intravenous Stopped 10/04/23 2107)  cefTRIAXone (ROCEPHIN) 2 g in sodium chloride  0.9 % 100 mL IVPB (0 g Intravenous Stopped 10/04/23 2107)  acetaminophen  (TYLENOL ) tablet 500 mg (500 mg Oral Given 10/04/23 2048)  iohexol  (OMNIPAQUE ) 350 MG/ML injection 75 mL (75 mLs Intravenous Contrast Given 10/04/23 2137)  lactated ringers  bolus 250 mL (250 mLs Intravenous New Bag/Given 10/04/23 2216)    Clinical Course as of 10/04/23 2252  Tue Oct 04, 2023  2034 Temp(!): 100.5 F (38.1 C) [JL]  2034 Resp(!): 22 [JL]    Clinical Course User Index [JL] Jerrol Agent, MD                                 Medical Decision Making Amount and/or Complexity of Data Reviewed Labs: ordered. Radiology: ordered.  Risk OTC drugs. Prescription drug  management. Decision regarding hospitalization.   43 year old female with medical history significant for [place] cirrhosis, DMT2, obesity,  esophageal varices, portal hypertensive gastropathy, frequent urinary tract infections, depression, GERD presenting to the emergency department with cough, shortness of breath, chills, generalized weakness and abdominal discomfort.  The patient states that she has been off of her spironolactone and Lasix  for the past week due to having plantar fasciitis surgery.  She also states that 1 week ago she had a tooth extraction.  She has not been on any antibiotics after this tooth extraction.  She endorses chills and fatigue.  She does state that she has a history of a cardiac murmur, and denies any active chest pain.  She endorses mild generalized abdominal discomfort.  She is moving her bowels and had a last bowel movement today.  She endorses some dysuria.  On arrival, the patient was febrile temperature 100.5 for which she was administered only 500 mg of Tylenol  given her history of cirrhosis, tachypneic RR 22, hypoxic subsequently placed on 4 L O2 via nasal cannula, not tachycardic heart rate 86, BP 111/52.  Sinus rhythm noted on cardiac telemetry.  Physical exam revealed a distended abdomen, nontender on exam, cardiac murmur appreciated on exam.  Initial EKG revealed sinus rhythm, ventricular rate 86, no acute ischemic changes, LVH present.  Initial chest x-ray revealed mild umbel left basilar atelectasis, no changes noted.  Septic workup initiated on patient arrival given patient meeting SIRS criteria of fever and tachypnea.  Blood pressure did trend down to 99/56 and the patient was administered 500 cc of IV fluids for volume resuscitation and she had subsequent improvement with systolic blood pressure greater than 100.  Initial lactic acid was normal at 1.7.  Chest x-ray showed minimal left basilar atelectasis  Labs: Ammonia elevated to 89, INR mildly elevated to 1.5,  CMP with hyperglycemia without an anion gap acidosis with a blood glucose of 213, bicarbonate 20 and an anion gap of 8, LFTs mildly elevated with an AST of 58, ALT normal at 51, creatinine normal, CBC without a leukocytosis, hemoglobin mildly anemic to 10.7, COVID, flu, RSV PCR testing negative, full RVP ordered and pending.  The patient given her history of recent dental procedure and murmur appreciated on exam and fever of as yet unexplained origin was covered with broad-spectrum antibiotics to include vancomycin and Rocephin.  She has very minimal to no tenderness on exam of her abdomen, lower concern for SBP at this time.  CTA PE: IMPRESSION:  1. No evidence for pulmonary embolism.  2. Mild bibasilar atelectasis.  3. Cirrhotic liver with sequela of portal hypertension including  splenomegaly, varices and large volume ascites.  4. Diffuse wall thickening of the ascending, transverse and  descending colon worrisome for colitis.  5. 4.8 cm left ovarian hemorrhagic cyst. This can be further  evaluated with pelvic ultrasound.  6. Cholelithiasis.   CT Abdomen Pelvis: IMPRESSION:  1. No evidence for pulmonary embolism.  2. Mild bibasilar atelectasis.  3. Cirrhotic liver with sequela of portal hypertension including  splenomegaly, varices and large volume ascites.  4. Diffuse wall thickening of the ascending, transverse and  descending colon worrisome for colitis.  5. 4.8 cm left ovarian hemorrhagic cyst. This can be further  evaluated with pelvic ultrasound.  6. Cholelithiasis.   Urinalysis and UDS pending in addition to full RVP at time of admission.  Patient hemodynamically stable with improvement in blood pressure, consulted Dr. Lavetta for further management, patient subsequently Accepted and admission in stabilized condition.       Final diagnoses:  Shortness of breath  Colitis  Hepatic encephalopathy (  HCC)  Hyperammonemia (HCC)  Hypoxia  Cardiac murmur  Fever, unspecified  fever cause  SIRS (systemic inflammatory response syndrome) [facility])    ED Discharge Orders     None          Jerrol Agent, MD 10/04/23 2255

## 2023-10-04 NOTE — Telephone Encounter (Signed)
 Pt left message for a call back.  Returned call and she lost her surgery bag. She is scheduled for surgery on 10/06/23.  I put a bag up front with her name on it to pick up.

## 2023-10-04 NOTE — ED Triage Notes (Signed)
 Pt BIB [facility] from home d/t SOB.  Pt has Nonalcoholic cirrhosis but has been off spirolactone and Lasix  for 1 week d/t having plantar Factitias surgery.  Pt has had chills, warm to warm   to touch and gen weakness.     BP 155/86 HR 86 RR24 CBG 228

## 2023-10-04 NOTE — ED Notes (Signed)
 Pt reported feeling very itchy while receiving IV vancomycin (roughly 25% bag remaining), MD at bedside and verbal order to stop Vancomycin. Steroid meds ordered.

## 2023-10-04 NOTE — H&P (Signed)
 History and Physical    Laurie Sutton FMW:[id number] DOB: December 21, 1980 DOA: 10/04/2023  PCP: [name], MD   Patient coming from: Home   Chief Complaint:  Chief Complaint  Patient presents with   Shortness of Breath   ED TRIAGE note:Pt BIB [facility] from home d/t SOB.  Pt has Nonalcoholic cirrhosis but has been off spirolactone and Lasix  for 1 week d/t having plantar Factitias surgery.  Pt has had chills, warm to warm   to touch and gen weakness.      BP 155/86 HR 86 RR24 CBG 228  HPI:  Laurie Sutton is a 43 y.o. female with medical history significant of Nash cirrhosism, esophageal varices, chronic thrombocytopenia frequent UTI, depression, DM type II, GERD, menorrhagia, peripheral neuropathy and patient recently has plantar fascial surgery 1 week ago of Lasix  and spironolactone for a week presented to emergency department complaining of generalized weakness, fever, chill, shortness of breath, cough, dysuria, and abdominal discomfort. Patient reported that 1 week ago she had a tooth extraction.  She has not been on antibiotic after the tooth extraction.  Endorsed doing chills and fatigue.  Patient denies any constipation, nausea and vomiting.  During my evaluation at the bedside patient is alert and oriented x 3 however she has slowed response to answering questions.  While in the ED patient has been receiving IV vancomycin started having rash on the forehead and upper chest with itchiness.  Physical exam did not reveal any respiratory distress, stridor and tongue swelling.  ED Course:  At presentation to ED patient found febrile, hypotensive and tachypneic.  Requiring oxygen currently maintaining 100% on 4 L. Pending respiratory panel.  Pending UDS.  Pending UA.  Pending blood cultures. VBG showing respiratory alkalosis and metabolic acidosis bicarb 19. Lactic acid within normal range at 1.7. Pregnancy test negative. CMP showing low bicarb 20, elevated bilirubin 2.7,  elevated AST 58.  Low albumin 2.4.  Elevated direct bilirubin.  Elevated pro time and INR. CBC no evidence of leukocytosis, stable H&H and low platelet count 69 which is at baseline. EKG normal sinus rhythm heart rate 86.  Chest x-ray showed minimal left basilar atelectasis.  CT chest and CT abdomen 1. no evidence of pulmonary embolism. 2. Mild bibasilar atelectasis. 3. Cirrhotic liver with sequela of portal hypertension including splenomegaly, varices and large volume ascites. 4. Diffuse wall thickening of the ascending, transverse and descending colon worrisome for colitis. 5. 4.8 cm left ovarian hemorrhagic cyst. This can be further evaluated with pelvic ultrasound. 6. Cholelithiasis.  In the ED patient has been given LR bolus vancomycin and ceftriaxone. Dr. Jerrol stated that patient has total extraction few days ago and physical exam showed Marbert and he is concerned that patient probably developing underlying endocarditis need inpatient echocardiogram to rule out. Husband also bedside reported that patient has some confusion however patient is presently alert oriented x 3.  Hospitalist has been consulted for further evaluation management of sepsis in the setting of colitiis, decompensated hepatic cirrhosis with large volume ascites and acute hypoxic respiratory failure in the setting of bibasilar  atelectasis.  Need echocardiogram to rule out endocarditis.  Significant labs in the ED: Lab Orders         Culture, blood (Routine x 2)         Resp panel by RT-PCR (RSV, Flu A&B, Covid) Anterior Nasal Swab         Respiratory (~20 pathogens) panel by PCR  Gastrointestinal Panel by PCR , Stool         C Difficile Quick Screen w PCR reflex         Comprehensive metabolic panel         CBC with Differential         Protime-INR         Urinalysis, w/ Reflex to Culture (Infection Suspected) -Urine, Clean Catch         Bilirubin, direct         Ammonia         Rapid urine  drug screen (hospital performed)         hCG, serum, qualitative         HIV Antibody (routine testing w rflx)         Hemoglobin A1c         Protime-INR         CBC         Comprehensive metabolic panel         APTT         I-Stat venous blood gas, ([facility] ED, [facility], [facility])         CBG monitoring, ED       Review of Systems:  Review of Systems  Constitutional:  Positive for chills and malaise/fatigue. Negative for fever and weight loss.  Respiratory:  Positive for cough, sputum production and shortness of breath. Negative for hemoptysis and wheezing.   Cardiovascular:  Positive for orthopnea. Negative for chest pain and leg swelling.  Gastrointestinal:  Negative for abdominal pain, blood in stool, constipation, diarrhea, heartburn, melena, nausea and vomiting.  Genitourinary:  Positive for dysuria.  Musculoskeletal:  Negative for back pain, falls, joint pain, myalgias and neck pain.  Neurological:  Negative for dizziness and headaches.  Psychiatric/Behavioral:  The patient is not nervous/anxious.     Past Medical History:  Diagnosis Date   ADHD    Allergy    Anemia    IRON TRANSFUSION 07-2020 NONE SINCE   Anxiety    Back pain    COVID 08/12/2019   ALL SYMPTOMS REOLVED PER PT   Depression    Diabetic neuropathy (HCC) 11/11/2020   FEET   dm type 2    sees Dr. Odella Jacobson at [facility]   Fatty liver    GERD (gastroesophageal reflux disease)    History of kidney stones    Joint pain    Menorrhagia 11/11/2020   Migraines    Murmur, cardiac    FAINT NO CARDIOLOGIST   Other fatigue    Pneumonia 08/12/2019   COVID PNEUMONIA ALL SYMPTOMS RESOLVED PER PT   Shortness of breath on exertion     Past Surgical History:  Procedure Laterality Date   CESAREAN SECTION  12/13/2006   COLONOSCOPY WITH PROPOFOL  N/A 08/02/2023   Procedure: COLONOSCOPY WITH PROPOFOL ;  Surgeon: Legrand Victory LITTIE DOUGLAS, MD;  Location: [facility];  Service: Gastroenterology;  Laterality: N/A;    ESOPHAGOGASTRODUODENOSCOPY (EGD) WITH PROPOFOL  N/A 08/02/2023   Procedure: ESOPHAGOGASTRODUODENOSCOPY (EGD) WITH PROPOFOL ;  Surgeon: Legrand Victory LITTIE DOUGLAS, MD;  Location: [facility];  Service: Gastroenterology;  Laterality: N/A;   EXTRACORPOREAL SHOCK WAVE LITHOTRIPSY  2018   FOOT SURGERY Right 10/02/2020   RIGHT FOOT HEEL AND TOES, SUR[facility] OFF [address]   IR PARACENTESIS  07/06/2023   IR PARACENTESIS  08/18/2023   IR TRANSCATHETER BX  07/26/2023   IR US  GUIDE VASC ACCESS RIGHT  07/26/2023   IR VENOGRAM HEPATIC W  HEMODYNAMIC EVALUATION  07/26/2023   LAPAROSCOPIC VAGINAL HYSTERECTOMY WITH SALPINGECTOMY Bilateral 11/17/2020   Procedure: LAPAROSCOPIC ASSISTED VAGINAL HYSTERECTOMY WITH BILATERAL SALPINGECTOMY;  Surgeon: Dannielle Bouchard, DO;  Location: [facility];  Service: Gynecology;  Laterality: Bilateral;   POLYPECTOMY  08/02/2023   Procedure: POLYPECTOMY, INTESTINE;  Surgeon: Legrand Victory LITTIE DOUGLAS, MD;  Location: [facility];  Service: Gastroenterology;;     reports that she has never smoked. She has been exposed to tobacco smoke. She has never used smokeless tobacco. She reports that she does not drink alcohol and does not use drugs.  Allergies  Allergen Reactions   Vancomycin Itching   Amoxicillin Hives and Itching   Azithromycin      DOES NOT WORK   Dulaglutide  Other (See Comments)    constipation and stomach issues  **Trulicity **    Empagliflozin -Metformin  Hcl Er Other (See Comments) and Swelling   Januvia  [Sitagliptin ] Other (See Comments)    HURTS STOAMCH   Latex     IRRITATES VAGINAL AREA   Metformin      Other Reaction(s): achy all over   Penicillins Hives    Has patient had a PCN reaction causing immediate rash, facial/tongue/throat swelling, SOB or lightheadedness with hypotension: yes. Rash  Has patient had a PCN reaction causing severe rash involving mucus membranes or skin necrosis: Yes- rash and hives all over body  Has patient had a PCN reaction that  required hospitalization No Has patient had a PCN reaction occurring within the last 10 years: Yes  If all of the above answers are NO, then may proceed with Cephalosporin use.     Family History  Problem Relation Age of Onset   Colon polyps Mother    Depression Mother    Anxiety disorder Mother    Colon polyps Father    Sleep apnea Father    Anxiety disorder Father    Depression Father    Diabetes Father    Bladder Cancer Father 56   Rheum arthritis Father    Migraines Maternal Aunt    Migraines Maternal Grandmother    Diabetes Maternal Grandfather    Healthy Daughter    Asthma Daughter    Anxiety disorder Daughter    Anxiety disorder Son    Asthma Son    Healthy Son    Cerebral aneurysm Cousin    Heart disease Other    Depression Other    Anxiety disorder Other    Sleep apnea Other    Colon cancer Neg Hx    Esophageal cancer Neg Hx    Stomach cancer Neg Hx    Rectal cancer Neg Hx     Prior to Admission medications   Medication Sig Start Date End Date Taking? Authorizing Provider  albuterol  (VENTOLIN  HFA) 108 (90 Base) MCG/ACT inhaler INHALE 2 PUFFS INTO THE LUNGS UP TO EVERY 6 HOURS AS NEEDED FOR WHEEZING/SHORTNESS OF BREATH 08/20/22   [name], MD  clotrimazole -betamethasone  (LOTRISONE ) cream APPLY 1 APPLICATION TOPICALLY TWICE A DAY AS NEEDED 05/26/23   [name], MD  colchicine  0.6 MG tablet Take 1 tablet (0.6 mg total) by mouth daily. Patient not taking: Reported on 07/19/2023 06/28/23   Janit Thresa HERO, DPM  Continuous Blood Gluc Sensor (FREESTYLE LIBRE 14 DAY SENSOR) MISC Apply topically every 14 (fourteen) days. Patient not taking: Reported on 07/19/2023 12/26/19   [provider]  Continuous Glucose Sensor (DEXCOM G7 SENSOR) MISC Use 1 sensor for continuous glucose monitoring every 10 days for 30 days 05/25/23  Braulio Hough, MD  Continuous Glucose Sensor (FREESTYLE LIBRE 3 PLUS SENSOR) MISC Change every 15 days. Patient not taking: Reported on  07/19/2023 05/23/23     Continuous Glucose Sensor (FREESTYLE LIBRE 3 SENSOR) MISC Apply sensor to upper back of arm, change sensor every 14 days. Patient not taking: Reported on 07/19/2023 12/30/22   Braulio Hough, MD  Cranberry 125 MG TABS Take by mouth 2 (two) times daily. Patient not taking: Reported on 06/28/2023    [provider]  doxycycline  (VIBRA -TABS) 100 MG tablet Take 1 tablet (100 mg total) by mouth 2 (two) times daily. Patient not taking: Reported on 07/19/2023 06/28/23   Janit Thresa HERO, DPM  FLUoxetine  (PROZAC ) 40 MG capsule TAKE 1 CAPSULE BY MOUTH TWICE A DAY 07/26/23   [name], MD  furosemide  (LASIX ) 20 MG tablet Take 2 tablets by mouth daily. 06/29/23 06/28/24  [provider]  gabapentin  (NEURONTIN ) 100 MG capsule Take 1 capsule (100 mg total) by mouth 2 (two) times daily. Patient not taking: Reported on 07/19/2023 11/08/22   [name], MD  HUMULIN  R U-500 KWIKPEN 500 UNIT/ML KwikPen Use 120 units at breakfast, 100 at lunch, and 100 at dinner Patient taking differently: Use 60 units at breakfast, 65 at lunch, and 65 at dinner 04/08/22   [name], MD  lactulose, encephalopathy, (CHRONULAC) 10 GM/15ML SOLN Take by mouth. 07/12/23 07/11/24  [provider]  LINZESS  145 MCG CAPS capsule TAKE 1 CAPSULE BY MOUTH DAILY BEFORE BREAKFAST. Patient not taking: Reported on 07/19/2023 03/17/23   Legrand Victory LITTIE DOUGLAS, MD  LORazepam  (ATIVAN ) 0.5 MG tablet Take 1 tablet (0.5 mg total) by mouth 2 (two) times daily as needed for anxiety. Patient not taking: Reported on 06/28/2023 12/29/22   [name], MD  montelukast  (SINGULAIR ) 10 MG tablet Take 1 tablet (10 mg total) by mouth at bedtime. 08/25/22   [name], MD  mupirocin  ointment (BACTROBAN ) 2 % Apply 1 Application topically 2 (two) times daily. 06/28/23   Janit Thresa HERO, DPM  nortriptyline  (PAMELOR ) 10 MG capsule Take 3 capsules (30 mg total) by mouth at bedtime. Patient not taking: Reported on 06/28/2023 02/23/23    Gayland Lauraine PARAS, NP  omeprazole  (PRILOSEC) 40 MG capsule TAKE 1 CAPSULE (40 MG TOTAL) BY MOUTH IN THE MORNING 07/25/23   Legrand Victory LITTIE DOUGLAS, MD  ondansetron  (ZOFRAN -ODT) 4 MG disintegrating tablet Take 1 tablet (4 mg total) by mouth every 8 (eight) hours as needed for nausea or vomiting. 02/09/23   Becki Krabbe, FNP  propranolol  (INDERAL ) 60 MG tablet Take 1 tablet (60 mg total) by mouth 2 (two) times daily. 02/23/23   Gayland Lauraine PARAS, NP  Sodium Sulfate-Mag Sulfate-KCl (SUTAB ) [phone] MG TABS Use as directed for colonoscopy. MANUFACTURER CODES!! BIN: [id number] PCN: CN GROUP: [id number] MEMBER ID: [id number];MLW AS SECONDARY INSURANCE ;NO PRIOR AUTHORIZATION 07/29/23   Kerman Vina HERO, NP  tirzepatide  (MOUNJARO ) 10 MG/0.5ML Pen Inject 10 mg into the skin once a week. 02/16/23     tirzepatide  (MOUNJARO ) 10 MG/0.5ML Pen Inject 10 mg into the skin once a week. 05/23/23     tirzepatide  (MOUNJARO ) 7.5 MG/0.5ML Pen Inject 7.5 mg Subcutaneous once a week 30 days 07/06/23        Physical Exam: Vitals:   10/04/23 2300 10/04/23 2330 10/04/23 2345 10/05/23 0000  BP: 104/61 (!) 99/56  104/60  Pulse: 76 76  73  Resp: 14 18  19   Temp:   98.5 F (36.9 C)  TempSrc:   Oral   SpO2: 96% 95%  98%  Weight:      Height:        Physical Exam Vitals reviewed.  Constitutional:      General: She is not in acute distress.    Appearance: She is obese. She is ill-appearing. She is not toxic-appearing or diaphoretic.   Eyes:     General: Scleral icterus present.     Pupils: Pupils are equal, round, and reactive to light.    Cardiovascular:     Rate and Rhythm: Normal rate and regular rhythm.  Pulmonary:     Effort: Pulmonary effort is normal. No tachypnea, accessory muscle usage or respiratory distress.     Breath sounds: Normal breath sounds. No stridor. No decreased breath sounds, wheezing, rhonchi or rales.  Abdominal:     Palpations: Abdomen is soft. There is splenomegaly. There is no  hepatomegaly or mass.     Tenderness: There is no abdominal tenderness. There is no guarding.     Comments: Distended abdomen.  Positive fluid thrill and fluid shift   Musculoskeletal:     Cervical back: Neck supple.     Right lower leg: No edema.     Left lower leg: No edema.   Skin:    Coloration: Skin is not cyanotic or pale.     Findings: Rash present. No ecchymosis or erythema.     Nails: There is no clubbing.      Labs on Admission: I have personally reviewed following labs and imaging studies  CBC: Recent Labs  Lab 10/04/23 2002 10/04/23 2208  WBC 4.4  --   NEUTROABS 3.5  --   HGB 10.7* 9.2*  HCT 31.3* 27.0*  MCV 93.4  --   PLT 69*  --    Basic Metabolic Panel: Recent Labs  Lab 10/04/23 2002 10/04/23 2208  NA 135 137  K 4.0 3.9  CL 107  --   CO2 20*  --   GLUCOSE 213*  --   BUN 11  --   CREATININE 0.89  --   CALCIUM 8.2*  --    GFR: Estimated Creatinine Clearance: 98.1 mL/min (by C-G formula based on SCr of 0.89 mg/dL). Liver Function Tests: Recent Labs  Lab 10/04/23 2002  AST 58*  ALT 31  ALKPHOS 85  BILITOT 2.7*  PROT 6.4*  ALBUMIN 2.4*   No results for input(s): LIPASE, AMYLASE in the last 168 hours. Recent Labs  Lab 10/04/23 2055  AMMONIA 89*   Coagulation Profile: Recent Labs  Lab 10/04/23 2002  INR 1.5*   Cardiac Enzymes: No results for input(s): CKTOTAL, CKMB, CKMBINDEX, TROPONINI, TROPONINIHS in the last 168 hours. BNP (last 3 results) No results for input(s): BNP in the last 8760 hours. HbA1C: No results for input(s): HGBA1C in the last 72 hours. CBG: Recent Labs  Lab 10/04/23 2344  GLUCAP 173*   Lipid Profile: No results for input(s): CHOL, HDL, LDLCALC, TRIG, CHOLHDL, LDLDIRECT in the last 72 hours. Thyroid  Function Tests: No results for input(s): TSH, T4TOTAL, FREET4, T3FREE, THYROIDAB in the last 72 hours. Anemia Panel: No results for input(s): VITAMINB12, FOLATE,  FERRITIN, TIBC, IRON, RETICCTPCT in the last 72 hours. Urine analysis:    Component Value Date/Time   COLORURINE YELLOW 06/21/2023 0156   APPEARANCEUR CLEAR 06/21/2023 0156   LABSPEC 1.030 06/21/2023 0156   PHURINE 7.0 06/21/2023 0156   GLUCOSEU >1,000 (A) 06/21/2023 0156   HGBUR NEGATIVE 06/21/2023 0156   BILIRUBINUR NEGATIVE 06/21/2023  0156   BILIRUBINUR 1+ 10/07/2022 0917   KETONESUR NEGATIVE 06/21/2023 0156   PROTEINUR NEGATIVE 06/21/2023 0156   UROBILINOGEN 1.0 10/07/2022 0917   UROBILINOGEN 1.0 12/21/2014 1020   NITRITE NEGATIVE 06/21/2023 0156   LEUKOCYTESUR NEGATIVE 06/21/2023 0156    Radiological Exams on Admission: I have personally reviewed images CT Angio Chest PE W and/or Wo Contrast Result Date: 10/04/2023 CLINICAL DATA:  High probability for PE. Non alcoholic cirrhosis. Chills and weakness. Acute abdominal pain. EXAM: CT ANGIOGRAPHY CHEST CT ABDOMEN AND PELVIS WITH CONTRAST TECHNIQUE: Multidetector CT imaging of the chest was performed using the standard protocol during bolus administration of intravenous contrast. Multiplanar CT image reconstructions and MIPs were obtained to evaluate the vascular anatomy. Multidetector CT imaging of the abdomen and pelvis was performed using the standard protocol during bolus administration of intravenous contrast. RADIATION DOSE REDUCTION: This exam was performed according to the departmental dose-optimization program which includes automated exposure control, adjustment of the mA and/or kV according to patient size and/or use of iterative reconstruction technique. CONTRAST:  75mL OMNIPAQUE  IOHEXOL  350 MG/ML SOLN COMPARISON:  CT abdomen and pelvis 06/06/2023 FINDINGS: CTA CHEST FINDINGS Cardiovascular: Satisfactory opacification of the pulmonary arteries to the segmental level. No evidence of pulmonary embolism. Heart is borderline enlarged/mildly enlarged. No pericardial effusion. Mediastinum/Nodes: No enlarged mediastinal, hilar, or  axillary lymph nodes. Thyroid  gland, trachea, and esophagus demonstrate no significant findings. Lungs/Pleura: There is mild atelectasis in the bilateral lower lobes and lingula. There is no pleural effusion or pneumothorax. Musculoskeletal: No chest wall abnormality. No acute or significant osseous findings. Review of the MIP images confirms the above findings. CT ABDOMEN and PELVIS FINDINGS Hepatobiliary: There is nodular liver contour, unchanged. Gallstones are present. There is no biliary ductal dilatation. Pancreas: Unremarkable. No pancreatic ductal dilatation or surrounding inflammatory changes. Spleen: Spleen is enlarged measuring up to 18 cm similar to prior. Adrenals/Urinary Tract: There are 2 subcentimeter hypodensities in the left kidney which are too small to characterize, likely cysts. Otherwise, the kidneys, adrenal glands and bladder are within normal limits. Stomach/Bowel: There is diffuse wall thickening of the ascending colon, transverse colon and descending colon. The appendix is within normal limits. No bowel obstruction, pneumatosis or free air visualized. Stomach is within normal limits. Vascular/Lymphatic: Aorta and IVC are normal in size. Splenic varices are present. No enlarged lymph nodes are identified. Reproductive: 4.8 cm cyst with hyperdense fluid fluid level noted in the left ovary. Right ovary is within normal limits. The uterus is surgically absent. Other: There is large volume ascites. There is no focal abdominal wall hernia or free air. Musculoskeletal: No fracture is seen. Review of the MIP images confirms the above findings. IMPRESSION: 1. No evidence for pulmonary embolism. 2. Mild bibasilar atelectasis. 3. Cirrhotic liver with sequela of portal hypertension including splenomegaly, varices and large volume ascites. 4. Diffuse wall thickening of the ascending, transverse and descending colon worrisome for colitis. 5. 4.8 cm left ovarian hemorrhagic cyst. This can be further  evaluated with pelvic ultrasound. 6. Cholelithiasis. Electronically Signed   By: Greig Pique M.D.   On: 10/04/2023 21:47   CT ABDOMEN PELVIS W CONTRAST Result Date: 10/04/2023 CLINICAL DATA:  High probability for PE. Non alcoholic cirrhosis. Chills and weakness. Acute abdominal pain. EXAM: CT ANGIOGRAPHY CHEST CT ABDOMEN AND PELVIS WITH CONTRAST TECHNIQUE: Multidetector CT imaging of the chest was performed using the standard protocol during bolus administration of intravenous contrast. Multiplanar CT image reconstructions and MIPs were obtained to evaluate the vascular anatomy. Multidetector  CT imaging of the abdomen and pelvis was performed using the standard protocol during bolus administration of intravenous contrast. RADIATION DOSE REDUCTION: This exam was performed according to the departmental dose-optimization program which includes automated exposure control, adjustment of the mA and/or kV according to patient size and/or use of iterative reconstruction technique. CONTRAST:  75mL OMNIPAQUE  IOHEXOL  350 MG/ML SOLN COMPARISON:  CT abdomen and pelvis 06/06/2023 FINDINGS: CTA CHEST FINDINGS Cardiovascular: Satisfactory opacification of the pulmonary arteries to the segmental level. No evidence of pulmonary embolism. Heart is borderline enlarged/mildly enlarged. No pericardial effusion. Mediastinum/Nodes: No enlarged mediastinal, hilar, or axillary lymph nodes. Thyroid  gland, trachea, and esophagus demonstrate no significant findings. Lungs/Pleura: There is mild atelectasis in the bilateral lower lobes and lingula. There is no pleural effusion or pneumothorax. Musculoskeletal: No chest wall abnormality. No acute or significant osseous findings. Review of the MIP images confirms the above findings. CT ABDOMEN and PELVIS FINDINGS Hepatobiliary: There is nodular liver contour, unchanged. Gallstones are present. There is no biliary ductal dilatation. Pancreas: Unremarkable. No pancreatic ductal dilatation or  surrounding inflammatory changes. Spleen: Spleen is enlarged measuring up to 18 cm similar to prior. Adrenals/Urinary Tract: There are 2 subcentimeter hypodensities in the left kidney which are too small to characterize, likely cysts. Otherwise, the kidneys, adrenal glands and bladder are within normal limits. Stomach/Bowel: There is diffuse wall thickening of the ascending colon, transverse colon and descending colon. The appendix is within normal limits. No bowel obstruction, pneumatosis or free air visualized. Stomach is within normal limits. Vascular/Lymphatic: Aorta and IVC are normal in size. Splenic varices are present. No enlarged lymph nodes are identified. Reproductive: 4.8 cm cyst with hyperdense fluid fluid level noted in the left ovary. Right ovary is within normal limits. The uterus is surgically absent. Other: There is large volume ascites. There is no focal abdominal wall hernia or free air. Musculoskeletal: No fracture is seen. Review of the MIP images confirms the above findings. IMPRESSION: 1. No evidence for pulmonary embolism. 2. Mild bibasilar atelectasis. 3. Cirrhotic liver with sequela of portal hypertension including splenomegaly, varices and large volume ascites. 4. Diffuse wall thickening of the ascending, transverse and descending colon worrisome for colitis. 5. 4.8 cm left ovarian hemorrhagic cyst. This can be further evaluated with pelvic ultrasound. 6. Cholelithiasis. Electronically Signed   By: Greig Pique M.D.   On: 10/04/2023 21:47   DG Chest 2 View Result Date: 10/04/2023 CLINICAL DATA:  Possible sepsis EXAM: CHEST - 2 VIEW COMPARISON:  04/14/2022 FINDINGS: Cardiac shadow is within normal limits. Minimal left basilar atelectasis is noted. No bony abnormality is seen. IMPRESSION: Minimal left basilar atelectasis. Electronically Signed   By: Oneil Devonshire M.D.   On: 10/04/2023 20:47     EKG: My personal interpretation of EKG shows: Normal sinus rhythm heart rate  86.  Assessment/Plan: Principal Problem:   Decompensated hepatic cirrhosis (HCC) Active Problems:   Acute metabolic encephalopathy   Sepsis (HCC)   Colitis   Acute hypoxic respiratory failure (HCC)   Mild bibasilar atelectasis   Depression with anxiety   Liver cirrhosis secondary to NASH (HCC)   Non-insulin  dependent type 2 diabetes mellitus (HCC)   Chronic idiopathic thrombocytopenia (HCC)   Peripheral neuropathy    Assessment and Plan: Decompensated hepatic cirrhosis Acute metabolic encephalopathy in the setting of hyperammonemia History of portal hypertensive gastropathy History of esophageal varices -Patient presented emergency department complaining of increased confusion, shortness of breath, cough, dysuria, generalized weakness, abdominal discomfort and poor appetite.  At presentation to  ED patient found hypotensive, and low-grade fever.  O2 sat dropped to 88% has been placed on nasal cannula oxygen.  Patient reported she has not been taking Lasix  and spironolactone for almost 1 week. -CT abdomen pelvis showing large volume ascites. -CMP showing elevated AST and elevated bilirubin.  Elevated ammonia 89. - Decompensated hepatic cirrhosis with ascites in the setting of medication noncompliance. - Given patient is hypotensive holding Lasix  and is bilateral at this time. - Consulting IR for paracentesis and will send paracentesis fluid study. -Giving albumin 50 g and midodrine 10 mg tonight in the setting of hypotension - Starting lactulose 30 g 3 times daily.  Goal to have at least 2-3 bowel bowel movements in a day. Holding propranolol  in the setting of low blood pressure.   Sepsis secondary to colitis -Patient meets sepsis criteria in the setting of tachycardia, low-grade fever and hypotension.  CT abdomen pelvis showed evidence of colitis. CTA chest ruled out PE and pneumonia however it showed bibasilar atelectasis. - Blood cultures are pending.  Lactic acid within normal  range. - History of penicillin allergy.  Pharmacy recommended change Zosyn to cefepime/metronidazole. -Continue broad-spectrum coverage with IV vancomycin, IV cefepime and metronidazole. -Will check GI and C. difficile panel. Addendum - After giving vancomycin patient started having rash of the forehead, upper chest wall with associated itchiness.  Patient also seemed flushed with redness of the cheeks.  No evidence of new respiratory distress, stridor.   Concern for development of allergic reaction or early development of red man syndrome in setting of vancomycin use. -Giving IV Benadryl  and Solu-Medrol .  DC vancomycin completely. - Continue cefepime and metronidazole.   Acute hypoxic respiratory failure in the setting of bibasilar atelectasis and volume overload - Pending respiratory panel.  Chest x-ray and CT chest showed bilateral atelectasis.  In the ED patient O2 sat dropped below 88% placed on nasal cannula oxygen. - Patient being volume overloaded with ascites in the setting of medication noncompliance.  Due to low blood pressure unable to start Lasix  and spironolactone at this time.  Giving albumin and plan for paracentesis. - Continue supportive care, supplemental oxygen, spirometry, flutter valve and wean down oxygen to room air as patient tolerates.   Recent dental extraction - Reported recent dental extraction have not received any prophylactic antibiotic.  Physical exam showed murmur.  Echocardiogram to rule out vegetation.  History of depression Generalized anxiety disorder -Continue Prozac   Insulin -dependent DM type II - Continue high sliding scale insulin  coverage.  Chronic thrombocytopenia secondary to cirrhosis -Low platelet count 89.  Patient does not have any evidence of bleeding.  Continue to monitor for any development of bleeding and monitor platelet count.    Reactive airway disease - Continue albuterol  as needed and singular daily.   DVT prophylaxis: SCD  and TED hose.  Deferring pharmacological DVT prophylaxis in the setting of thrombocytopenia Code Status: Full code. Diet: Heart healthy and carb modified diet Family Communication:   Family was present at bedside, at the time of interview. Opportunity was given to ask question and all questions were answered satisfactorily.  Disposition Plan: Continue to monitor improvement of volume status, need to follow-up with paracentesis result.  Need to follow-up with echocardiogram development of any vegetation. Consults: None indicated at this time Admission status:   Inpatient, Telemetry bed  Severity of Illness: The appropriate patient status for this patient is INPATIENT. Inpatient status is judged to be reasonable and necessary in order to provide the required intensity of service  to ensure the patient's safety. The patient's presenting symptoms, physical exam findings, and initial radiographic and laboratory data in the context of their chronic comorbidities is felt to place them at high risk for further clinical deterioration. Furthermore, it is not anticipated that the patient will be medically stable for discharge from the hospital within 2 midnights of admission.   * I certify that at the point of admission it is my clinical judgment that the patient will require inpatient hospital care spanning beyond 2 midnights from the point of admission due to high intensity of service, high risk for further deterioration and high frequency of surveillance required.DEWAINE    Aunesty Tyson, MD [facility]  How to contact the [facility] Attending or Consulting provider 7A - 7P or covering provider during after hours 7P -7A, for this patient.  Check the care team in [facility] and look for a) attending/consulting [facility] provider listed and b) the [facility] team listed Log into [email or web address] and use [facility] universal password to access. If you do not have the password, please contact the hospital operator. Locate the [facility]  provider you are looking for under [facility] and page to a number that you can be directly reached. If you still have difficulty reaching the provider, please page the [facility] (Director on Call) for the Hospitalists listed on amion for assistance.  10/05/2023, 12:44 AM

## 2023-10-04 NOTE — Progress Notes (Signed)
 ED Pharmacy Antibiotic Sign Off An antibiotic consult was received from an ED provider for vancomycin per pharmacy dosing for bacteremia and c/f endocarditis. A chart review was completed to assess appropriateness.  A single dose of ceftriaxone placed by the ED provider.   The following one time order(s) were placed per pharmacy consult:  vancomycin 2000 mg x 1 dose  Further antibiotic and/or antibiotic pharmacy consults should be ordered by the admitting provider if indicated.   Thank you for allowing pharmacy to be a part of this patient's care.   Dorn Buttner, PharmD, BCPS 10/04/2023 8:55 PM ED Clinical Pharmacist -  [phone]

## 2023-10-05 ENCOUNTER — Inpatient Hospital Stay (HOSPITAL_COMMUNITY)

## 2023-10-05 ENCOUNTER — Telehealth: Payer: Self-pay | Admitting: Podiatry

## 2023-10-05 ENCOUNTER — Other Ambulatory Visit: Payer: Self-pay

## 2023-10-05 DIAGNOSIS — K7290 Hepatic failure, unspecified without coma: Secondary | ICD-10-CM | POA: Diagnosis not present

## 2023-10-05 DIAGNOSIS — R011 Cardiac murmur, unspecified: Secondary | ICD-10-CM | POA: Diagnosis not present

## 2023-10-05 DIAGNOSIS — G9341 Metabolic encephalopathy: Secondary | ICD-10-CM | POA: Diagnosis not present

## 2023-10-05 DIAGNOSIS — J9601 Acute respiratory failure with hypoxia: Secondary | ICD-10-CM | POA: Diagnosis not present

## 2023-10-05 DIAGNOSIS — F418 Other specified anxiety disorders: Secondary | ICD-10-CM

## 2023-10-05 DIAGNOSIS — D649 Anemia, unspecified: Secondary | ICD-10-CM

## 2023-10-05 DIAGNOSIS — K7581 Nonalcoholic steatohepatitis (NASH): Secondary | ICD-10-CM

## 2023-10-05 DIAGNOSIS — R509 Fever, unspecified: Secondary | ICD-10-CM

## 2023-10-05 DIAGNOSIS — R6510 Systemic inflammatory response syndrome (SIRS) of non-infectious origin without acute organ dysfunction: Secondary | ICD-10-CM

## 2023-10-05 DIAGNOSIS — R188 Other ascites: Secondary | ICD-10-CM

## 2023-10-05 DIAGNOSIS — R7989 Other specified abnormal findings of blood chemistry: Secondary | ICD-10-CM

## 2023-10-05 DIAGNOSIS — K529 Noninfective gastroenteritis and colitis, unspecified: Secondary | ICD-10-CM | POA: Diagnosis not present

## 2023-10-05 HISTORY — PX: IR PARACENTESIS: IMG2679

## 2023-10-05 LAB — CBC
HCT: 28.7 % — ABNORMAL LOW (ref 36.0–46.0)
Hemoglobin: 9.7 g/dL — ABNORMAL LOW (ref 12.0–15.0)
MCH: 32.3 pg (ref 26.0–34.0)
MCHC: 33.8 g/dL (ref 30.0–36.0)
MCV: 95.7 fL (ref 80.0–100.0)
Platelets: 49 10*3/uL — ABNORMAL LOW (ref 150–400)
RBC: 3 MIL/uL — ABNORMAL LOW (ref 3.87–5.11)
RDW: 14.6 % (ref 11.5–15.5)
WBC: 2.9 10*3/uL — ABNORMAL LOW (ref 4.0–10.5)
nRBC: 0 % (ref 0.0–0.2)

## 2023-10-05 LAB — PROTIME-INR
INR: 1.8 — ABNORMAL HIGH (ref 0.8–1.2)
Prothrombin Time: 20.7 s — ABNORMAL HIGH (ref 11.4–15.2)

## 2023-10-05 LAB — HIV ANTIBODY (ROUTINE TESTING W REFLEX): HIV Screen 4th Generation wRfx: NONREACTIVE

## 2023-10-05 LAB — URINALYSIS, W/ REFLEX TO CULTURE (INFECTION SUSPECTED)
Bacteria, UA: NONE SEEN
Bilirubin Urine: NEGATIVE
Glucose, UA: 50 mg/dL — AB
Hgb urine dipstick: NEGATIVE
Ketones, ur: NEGATIVE mg/dL
Leukocytes,Ua: NEGATIVE
Nitrite: NEGATIVE
Protein, ur: NEGATIVE mg/dL
Specific Gravity, Urine: 1.045 — ABNORMAL HIGH (ref 1.005–1.030)
pH: 5 (ref 5.0–8.0)

## 2023-10-05 LAB — COMPREHENSIVE METABOLIC PANEL WITH GFR
ALT: 30 U/L (ref 0–44)
AST: 54 U/L — ABNORMAL HIGH (ref 15–41)
Albumin: 3.1 g/dL — ABNORMAL LOW (ref 3.5–5.0)
Alkaline Phosphatase: 76 U/L (ref 38–126)
Anion gap: 6 (ref 5–15)
BUN: 12 mg/dL (ref 6–20)
CO2: 20 mmol/L — ABNORMAL LOW (ref 22–32)
Calcium: 8.3 mg/dL — ABNORMAL LOW (ref 8.9–10.3)
Chloride: 106 mmol/L (ref 98–111)
Creatinine, Ser: 1 mg/dL (ref 0.44–1.00)
GFR, Estimated: >60 mL/min (ref >60)
Glucose, Bld: 420 mg/dL — ABNORMAL HIGH (ref 70–99)
Potassium: 4.4 mmol/L (ref 3.5–5.1)
Sodium: 132 mmol/L — ABNORMAL LOW (ref 135–145)
Total Bilirubin: 3.6 mg/dL — ABNORMAL HIGH (ref 0.0–1.2)
Total Protein: 6.8 g/dL (ref 6.5–8.1)

## 2023-10-05 LAB — GRAM STAIN: Gram Stain: NONE SEEN

## 2023-10-05 LAB — GLUCOSE, CAPILLARY
Glucose-Capillary: 201 mg/dL — ABNORMAL HIGH (ref 70–99)
Glucose-Capillary: 431 mg/dL — ABNORMAL HIGH (ref 70–99)
Glucose-Capillary: 353 mg/dL — ABNORMAL HIGH (ref 70–99)
Glucose-Capillary: 384 mg/dL — ABNORMAL HIGH (ref 70–99)
Glucose-Capillary: 332 mg/dL — ABNORMAL HIGH (ref 70–99)

## 2023-10-05 LAB — RESPIRATORY PANEL BY PCR

## 2023-10-05 LAB — ECHOCARDIOGRAM COMPLETE
AR max vel: 2.54 cm2
AV Peak grad: 14.4 mmHg
Ao pk vel: 1.9 m/s
Area-P 1/2: 2.91 cm2
Height: 66 in
S' Lateral: 2.9 cm
Weight: 3520 [oz_av]

## 2023-10-05 LAB — BODY FLUID CELL COUNT WITH DIFFERENTIAL
Eos, Fluid: 1 %
Lymphs, Fluid: 47 %
Monocyte-Macrophage-Serous Fluid: 37 % — ABNORMAL LOW (ref 50–90)
Neutrophil Count, Fluid: 15 % (ref 0–25)
Total Nucleated Cell Count, Fluid: 154 uL (ref 0–1000)

## 2023-10-05 LAB — RAPID URINE DRUG SCREEN, HOSP PERFORMED
Amphetamines: NOT DETECTED
Barbiturates: NOT DETECTED
Benzodiazepines: POSITIVE — AB
Cocaine: NOT DETECTED
Opiates: NOT DETECTED
Tetrahydrocannabinol: NOT DETECTED

## 2023-10-05 LAB — C DIFFICILE QUICK SCREEN W PCR REFLEX
C Diff antigen: POSITIVE — AB
C Diff toxin: NEGATIVE

## 2023-10-05 LAB — HEMOGLOBIN A1C
Hgb A1c MFr Bld: 6.1 % — ABNORMAL HIGH (ref 4.8–5.6)
Mean Plasma Glucose: 128.37 mg/dL

## 2023-10-05 LAB — GASTROINTESTINAL PANEL BY PCR, STOOL (REPLACES STOOL CULTURE)

## 2023-10-05 LAB — APTT: aPTT: 44 s — ABNORMAL HIGH (ref 24–36)

## 2023-10-05 LAB — CLOSTRIDIUM DIFFICILE BY PCR, REFLEXED: Toxigenic C. Difficile by PCR: POSITIVE — AB

## 2023-10-05 LAB — PROTEIN, PLEURAL OR PERITONEAL FLUID: Total protein, fluid: <3 g/dL

## 2023-10-05 MED ORDER — FUROSEMIDE 10 MG/ML IJ SOLN
40.0000 mg | Freq: Once | INTRAMUSCULAR | Status: AC
  Administered 2023-10-05: 40 mg via INTRAVENOUS
  Filled 2023-10-05: qty 4

## 2023-10-05 MED ORDER — LIDOCAINE HCL 1 % IJ SOLN
INTRAMUSCULAR | Status: AC
Start: 2023-10-05 — End: 2023-10-05
  Filled 2023-10-05: qty 20

## 2023-10-05 MED ORDER — SPIRONOLACTONE 25 MG PO TABS
150.0000 mg | ORAL_TABLET | Freq: Every day | ORAL | Status: DC
Start: 2023-10-06 — End: 2023-10-07
  Administered 2023-10-06 – 2023-10-07 (×2): 150 mg via ORAL
  Filled 2023-10-05 (×2): qty 6

## 2023-10-05 MED ORDER — FUROSEMIDE 40 MG PO TABS
60.0000 mg | ORAL_TABLET | Freq: Every day | ORAL | Status: DC
  Administered 2023-10-06 – 2023-10-07 (×2): 60 mg via ORAL
  Filled 2023-10-05 (×2): qty 1

## 2023-10-05 MED ORDER — INSULIN ASPART 100 UNIT/ML IJ SOLN
0.0000 [IU] | Freq: Every day | INTRAMUSCULAR | Status: DC
  Administered 2023-10-05: 4 [IU] via SUBCUTANEOUS

## 2023-10-05 MED ORDER — LIDOCAINE HCL 1 % IJ SOLN
20.0000 mL | Freq: Once | INTRAMUSCULAR | Status: AC
  Administered 2023-10-05: 10 mL

## 2023-10-05 MED ORDER — INSULIN ASPART 100 UNIT/ML IJ SOLN
0.0000 [IU] | Freq: Three times a day (TID) | INTRAMUSCULAR | Status: DC
  Administered 2023-10-05: 15 [IU] via SUBCUTANEOUS
  Administered 2023-10-06: 11 [IU] via SUBCUTANEOUS
  Administered 2023-10-06: 8 [IU] via SUBCUTANEOUS
  Administered 2023-10-06: 3 [IU] via SUBCUTANEOUS
  Administered 2023-10-07: 8 [IU] via SUBCUTANEOUS
  Administered 2023-10-07: 11 [IU] via SUBCUTANEOUS

## 2023-10-05 MED ORDER — INSULIN REGULAR HUMAN (CONC) 500 UNIT/ML ~~LOC~~ SOPN
30.0000 [IU] | PEN_INJECTOR | Freq: Three times a day (TID) | SUBCUTANEOUS | Status: DC
  Administered 2023-10-05 – 2023-10-06 (×4): 30 [IU] via SUBCUTANEOUS
  Filled 2023-10-05: qty 3

## 2023-10-05 MED ORDER — INSULIN ASPART 100 UNIT/ML IJ SOLN
8.0000 [IU] | Freq: Once | INTRAMUSCULAR | Status: AC
  Administered 2023-10-05: 8 [IU] via SUBCUTANEOUS

## 2023-10-05 MED ORDER — LACTULOSE 10 GM/15ML PO SOLN
20.0000 g | Freq: Two times a day (BID) | ORAL | Status: DC
  Administered 2023-10-05 – 2023-10-07 (×4): 20 g via ORAL
  Filled 2023-10-05 (×4): qty 30

## 2023-10-05 MED ORDER — PHYTONADIONE 5 MG PO TABS
10.0000 mg | ORAL_TABLET | Freq: Every day | ORAL | Status: AC
  Administered 2023-10-05 – 2023-10-07 (×3): 10 mg via ORAL
  Filled 2023-10-05 (×3): qty 2

## 2023-10-05 MED ORDER — FIDAXOMICIN 200 MG PO TABS
200.0000 mg | ORAL_TABLET | Freq: Two times a day (BID) | ORAL | Status: DC
  Administered 2023-10-05 – 2023-10-07 (×4): 200 mg via ORAL
  Filled 2023-10-05 (×5): qty 1

## 2023-10-05 MED ORDER — HYDROCODONE-ACETAMINOPHEN 5-325 MG PO TABS
1.0000 | ORAL_TABLET | ORAL | Status: DC | PRN
  Administered 2023-10-05 – 2023-10-07 (×6): 1 via ORAL
  Filled 2023-10-05 (×9): qty 1

## 2023-10-05 NOTE — Consult Note (Addendum)
 Consultation Note   Referring Provider:  [facility] PCP: [name], MD Primary Gastroenterologist:  Victory Brand, MD [facility] Reason for Consultation: Decompensated cirrhosis DOA: 10/04/2023         Hospital Day: 2   ASSESSMENT     43 year old female with decompensated MASH cirrhosis with ascites, esophageal varices, splenomegaly and probable hepatic encephalopathy. Admitted with periods of confusion,  shaking chills, SHOB over the last few weeks.  Also some left-sided abdominal discomfort MELD 3.0: 22 at 10/05/2023  4:59 AM No evidence for SBP.  UA not suspicious.  There is diffuse colitis on CT scan.  This can be seen in the setting of cirrhosis/ascites and she has not been having diarrhea  (other than  lactulose related) though her C. difficile study is indeterminate with positive antigen, positive PCR but negative toxin.  Colitis on CT scan.  Indeterminate C. difficile study, see above.  Portal colopathy versus true infectious process  SHOB Possibly related to large volume ascites . Chest CTA negative for PE but did show bibasilar atelectasis. Respiratory panel negative.   Chronic pancytopenia Stable  4.8 left ovarian hemorrhagic cyst on CT scan  DM2   Principal Problem:   Decompensated hepatic cirrhosis (HCC) Active Problems:   Depression with anxiety   Liver cirrhosis secondary to NASH (HCC)   Non-insulin  dependent type 2 diabetes mellitus (HCC)   Acute metabolic encephalopathy   Sepsis (HCC)   Colitis   Acute hypoxic respiratory failure (HCC)   Mild bibasilar atelectasis   Chronic idiopathic thrombocytopenia (HCC)   Peripheral neuropathy     PLAN:   --Discussed the importance of 2 g sodium restricted diet adherence --Renal function is normal, should be able to resume home diuretics tomorrow -- Lactulose 20 g twice daily -- Reasonable to continue Dificid . -- Await GI path panel -- She  has an upcoming appointment with at [facility]  HPI   43 y.o. year old female with a medical history including but not limited to GERD, chronic constipation, cirrhosis, DM 2, ADHD, cholelithiasis , depression  Brief history  Laurie Sutton was last seen in our office January 2025 for follow-up on cirrhosis.  Based on labs at the time there was concern for possible worsening portal hypertension . Subsequently CT scan late February showed new ascites.  She was started on diuretics and referred to [facility].  Additionally she was scheduled for EGD to evaluate esophageal varices ( had grade II).  Colonoscopy was added to evaluate constipation and rectal bleeding.  See results below.    Patient established care with [facility] on 06/29/2023 for evaluation and management of decompensated cirrhosis.  She has been compliant with diuretics at home but not strictly adhering to a low-sodium diet.  Husband said there has been periods of confusion at home.  Despite taking lactulose she sometimes has days with no bowel movement.   Interval history Laurie Sutton presented to the ED yesterday with shortness of breath, shaking chills.  She has been having some intermittent left mid abdominal pain.  No diarrhea other than that related to lactulose.  Some days she is does not even have a bowel movement despite taking lactulose twice a day .  She has had some lower  back discomfort and mild dysuria.  Husband describes several episodes of shaking chills over the last few weeks.  Episodes generally last about 30 minutes and then spontaneously resolve. . No fevers at home. In ED her temp was 100.5.  Chest CT angio negative for PE, did show bibasilar atelectasis.  She has been having some mild dysuria but UA not suspicious .  WBC 2.9, hemoglobin 9.7, platelets 49. CTAP showed diffuse colitis, cholelithiasis, left ovarian hemorrhagic cyst, large from ascites.  She underwent a 3 L paracentesis today.  No evidence for SBP.    Infectious workup so far --Status post 3 L LVP today.  No evidence for SBP.   --CXR >> Only mild left bibasilar atelectasis --U/A with reflex culture not suspicious though she has had some back pain and dysuria.   --Respiratory panel >> negative.   --Blood culture in progress but no growth at less than 12 hours -- GI path panel pending -- C. difficile antigen positive, C. difficile toxin negative, toxigenic C. difficile by PCR positive    Latest Reference Range & Units 10/04/23 23:04  C Diff antigen NEGATIVE  POSITIVE !  C Diff interpretation  Results are indeterminate. See PCR results.  C Diff toxin NEGATIVE  NEGATIVE  Toxigenic C. Difficile by PCR NEGATIVE  POSITIVE !  Gastrointestinal Panel by PCR , Stool  Rpt (IP)  !: Data is abnormal (IP): In Process Rpt: View report in Results Review for more information   Labs and Imaging:  Recent Labs    10/04/23 2002 10/05/23 0459  PROT 6.4* 6.8  ALBUMIN 2.4* 3.1*  AST 58* 54*  ALT 31 30  ALKPHOS 85 76  BILITOT 2.7* 3.6*  BILIDIR 0.8*  --    Recent Labs    10/04/23 2002 10/04/23 2208 10/05/23 0459  WBC 4.4  --  2.9*  HGB 10.7* 9.2* 9.7*  HCT 31.3* 27.0* 28.7*  MCV 93.4  --  95.7  PLT 69*  --  49*   Recent Labs    10/04/23 2002 10/04/23 2208 10/05/23 0459  NA 135 137 132*  K 4.0 3.9 4.4  CL 107  --  106  CO2 20*  --  20*  GLUCOSE 213*  --  420*  BUN 11  --  12  CREATININE 0.89  --  1.00  CALCIUM 8.2*  --  8.3*     CT ABDOMEN PELVIS W CONTRAST CLINICAL DATA:  High probability for PE. Non alcoholic cirrhosis. Chills and weakness. Acute abdominal pain.  EXAM: CT ANGIOGRAPHY CHEST  CT ABDOMEN AND PELVIS WITH CONTRAST  TECHNIQUE: Multidetector CT imaging of the chest was performed using the standard protocol during bolus administration of intravenous contrast. Multiplanar CT image reconstructions and MIPs were obtained to evaluate the vascular anatomy. Multidetector CT imaging of the abdomen and  pelvis was performed using the standard protocol during bolus administration of intravenous contrast.  RADIATION DOSE REDUCTION: This exam was performed according to the departmental dose-optimization program which includes automated exposure control, adjustment of the mA and/or kV according to patient size and/or use of iterative reconstruction technique.  CONTRAST:  75mL OMNIPAQUE  IOHEXOL  350 MG/ML SOLN  COMPARISON:  CT abdomen and pelvis 06/06/2023  FINDINGS: CTA CHEST FINDINGS  Cardiovascular: Satisfactory opacification of the pulmonary arteries to the segmental level. No evidence of pulmonary embolism. Heart is borderline enlarged/mildly enlarged. No pericardial effusion.  Mediastinum/Nodes: No enlarged mediastinal, hilar, or axillary lymph nodes. Thyroid  gland, trachea, and esophagus demonstrate no significant findings.  Lungs/Pleura: There  is mild atelectasis in the bilateral lower lobes and lingula. There is no pleural effusion or pneumothorax.  Musculoskeletal: No chest wall abnormality. No acute or significant osseous findings.  Review of the MIP images confirms the above findings.  CT ABDOMEN and PELVIS FINDINGS  Hepatobiliary: There is nodular liver contour, unchanged. Gallstones are present. There is no biliary ductal dilatation.  Pancreas: Unremarkable. No pancreatic ductal dilatation or surrounding inflammatory changes.  Spleen: Spleen is enlarged measuring up to 18 cm similar to prior.  Adrenals/Urinary Tract: There are 2 subcentimeter hypodensities in the left kidney which are too small to characterize, likely cysts. Otherwise, the kidneys, adrenal glands and bladder are within normal limits.  Stomach/Bowel: There is diffuse wall thickening of the ascending colon, transverse colon and descending colon. The appendix is within normal limits. No bowel obstruction, pneumatosis or free air visualized. Stomach is within normal limits.  Vascular/Lymphatic:  Aorta and IVC are normal in size. Splenic varices are present. No enlarged lymph nodes are identified.  Reproductive: 4.8 cm cyst with hyperdense fluid fluid level noted in the left ovary. Right ovary is within normal limits. The uterus is surgically absent.  Other: There is large volume ascites. There is no focal abdominal wall hernia or free air.  Musculoskeletal: No fracture is seen.  Review of the MIP images confirms the above findings.  IMPRESSION: 1. No evidence for pulmonary embolism. 2. Mild bibasilar atelectasis. 3. Cirrhotic liver with sequela of portal hypertension including splenomegaly, varices and large volume ascites. 4. Diffuse wall thickening of the ascending, transverse and descending colon worrisome for colitis. 5. 4.8 cm left ovarian hemorrhagic cyst. This can be further evaluated with pelvic ultrasound. 6. Cholelithiasis.  Electronically Signed   By: Greig Pique M.D.   On: 10/04/2023 21:47 CT Angio Chest PE W and/or Wo Contrast CLINICAL DATA:  High probability for PE. Non alcoholic cirrhosis. Chills and weakness. Acute abdominal pain.  EXAM: CT ANGIOGRAPHY CHEST  CT ABDOMEN AND PELVIS WITH CONTRAST  TECHNIQUE: Multidetector CT imaging of the chest was performed using the standard protocol during bolus administration of intravenous contrast. Multiplanar CT image reconstructions and MIPs were obtained to evaluate the vascular anatomy. Multidetector CT imaging of the abdomen and pelvis was performed using the standard protocol during bolus administration of intravenous contrast.  RADIATION DOSE REDUCTION: This exam was performed according to the departmental dose-optimization program which includes automated exposure control, adjustment of the mA and/or kV according to patient size and/or use of iterative reconstruction technique.  CONTRAST:  75mL OMNIPAQUE  IOHEXOL  350 MG/ML SOLN  COMPARISON:  CT abdomen and pelvis 06/06/2023  FINDINGS: CTA  CHEST FINDINGS  Cardiovascular: Satisfactory opacification of the pulmonary arteries to the segmental level. No evidence of pulmonary embolism. Heart is borderline enlarged/mildly enlarged. No pericardial effusion.  Mediastinum/Nodes: No enlarged mediastinal, hilar, or axillary lymph nodes. Thyroid  gland, trachea, and esophagus demonstrate no significant findings.  Lungs/Pleura: There is mild atelectasis in the bilateral lower lobes and lingula. There is no pleural effusion or pneumothorax.  Musculoskeletal: No chest wall abnormality. No acute or significant osseous findings.  Review of the MIP images confirms the above findings.  CT ABDOMEN and PELVIS FINDINGS  Hepatobiliary: There is nodular liver contour, unchanged. Gallstones are present. There is no biliary ductal dilatation.  Pancreas: Unremarkable. No pancreatic ductal dilatation or surrounding inflammatory changes.  Spleen: Spleen is enlarged measuring up to 18 cm similar to prior.  Adrenals/Urinary Tract: There are 2 subcentimeter hypodensities in the left kidney which are too small  to characterize, likely cysts. Otherwise, the kidneys, adrenal glands and bladder are within normal limits.  Stomach/Bowel: There is diffuse wall thickening of the ascending colon, transverse colon and descending colon. The appendix is within normal limits. No bowel obstruction, pneumatosis or free air visualized. Stomach is within normal limits.  Vascular/Lymphatic: Aorta and IVC are normal in size. Splenic varices are present. No enlarged lymph nodes are identified.  Reproductive: 4.8 cm cyst with hyperdense fluid fluid level noted in the left ovary. Right ovary is within normal limits. The uterus is surgically absent.  Other: There is large volume ascites. There is no focal abdominal wall hernia or free air.  Musculoskeletal: No fracture is seen.  Review of the MIP images confirms the above findings.  IMPRESSION: 1. No  evidence for pulmonary embolism. 2. Mild bibasilar atelectasis. 3. Cirrhotic liver with sequela of portal hypertension including splenomegaly, varices and large volume ascites. 4. Diffuse wall thickening of the ascending, transverse and descending colon worrisome for colitis. 5. 4.8 cm left ovarian hemorrhagic cyst. This can be further evaluated with pelvic ultrasound. 6. Cholelithiasis.  Electronically Signed   By: Greig Pique M.D.   On: 10/04/2023 21:47 DG Chest 2 View CLINICAL DATA:  Possible sepsis  EXAM: CHEST - 2 VIEW  COMPARISON:  04/14/2022  FINDINGS: Cardiac shadow is within normal limits. Minimal left basilar atelectasis is noted. No bony abnormality is seen.  IMPRESSION: Minimal left basilar atelectasis.  Electronically Signed   By: Oneil Devonshire M.D.   On: 10/04/2023 20:47    Pertinent GI Studies   Most recent endoscopic studies  08/02/2023 EGD for esophageal varices follow-up Grade 2 esophageal varices, PHG, nodular mucosa in the antrum, normal examined duodenum.  No specimens collected  08/02/2023 colonoscopy for rectal bleeding Redundant colon, 4 mm semisessile polyp removed from the transverse colon.  Congested mucosa in the entire examined colon (portal colopathy), internal hemorrhoids    Past Medical History:  Diagnosis Date   ADHD    Allergy    Anemia    IRON TRANSFUSION 07-2020 NONE SINCE   Anxiety    Back pain    COVID 08/12/2019   ALL SYMPTOMS REOLVED PER PT   Depression    Diabetic neuropathy (HCC) 11/11/2020   FEET   dm type 2    se[facility] Dr. Odella Jacobson at [facility]   Fatty liver    GERD (gastroesophageal reflux disease)    History of kidney stones    Joint pain    Menorrhagia 11/11/2020   Migraines    Murmur, cardiac    FAINT NO CARDIOLOGIST   Other fatigue    Pneumonia 08/12/2019   COVID PNEUMONIA ALL SYMPTOMS RESOLVED PER PT   Shortness of breath on exertion     Past Surgical History:  Procedure Laterality  Date   CESAREAN SECTION  12/13/2006   COLONOSCOPY WITH PROPOFOL  N/A 08/02/2023   Procedure: COLONOSCOPY WITH PROPOFOL ;  Surgeon: Legrand Victory LITTIE DOUGLAS, MD;  Location: [facility];  Service: Gastroenterology;  Laterality: N/A;   ESOPHAGOGASTRODUODENOSCOPY (EGD) WITH PROPOFOL  N/A 08/02/2023   Procedure: ESOPHAGOGASTRODUODENOSCOPY (EGD) WITH PROPOFOL ;  Surgeon: Legrand Victory LITTIE DOUGLAS, MD;  Location: [facility];  Service: Gastroenterology;  Laterality: N/A;   EXTRACORPOREAL SHOCK WAVE LITHOTRIPSY  2018   FOOT SURGERY Right 10/02/2020   RIGHT FOOT HEEL AND TOES, SUR[facility] OFF [address]   IR PARACENTESIS  07/06/2023   IR PARACENTESIS  08/18/2023   IR TRANSCATHETER BX  07/26/2023   IR US  GUIDE VASC  ACCESS RIGHT  07/26/2023   IR VENOGRAM HEPATIC W HEMODYNAMIC EVALUATION  07/26/2023   LAPAROSCOPIC VAGINAL HYSTERECTOMY WITH SALPINGECTOMY Bilateral 11/17/2020   Procedure: LAPAROSCOPIC ASSISTED VAGINAL HYSTERECTOMY WITH BILATERAL SALPINGECTOMY;  Surgeon: Dannielle Bouchard, DO;  Location: [facility];  Service: Gynecology;  Laterality: Bilateral;   POLYPECTOMY  08/02/2023   Procedure: POLYPECTOMY, INTESTINE;  Surgeon: Legrand Victory LITTIE DOUGLAS, MD;  Location: [facility];  Service: Gastroenterology;;    Family History  Problem Relation Age of Onset   Colon polyps Mother    Depression Mother    Anxiety disorder Mother    Colon polyps Father    Sleep apnea Father    Anxiety disorder Father    Depression Father    Diabetes Father    Bladder Cancer Father 40   Rheum arthritis Father    Migraines Maternal Aunt    Migraines Maternal Grandmother    Diabetes Maternal Grandfather    Healthy Daughter    Asthma Daughter    Anxiety disorder Daughter    Anxiety disorder Son    Asthma Son    Healthy Son    Cerebral aneurysm Cousin    Heart disease Other    Depression Other    Anxiety disorder Other    Sleep apnea Other    Colon cancer Neg Hx    Esophageal cancer Neg Hx    Stomach cancer Neg Hx     Rectal cancer Neg Hx     Prior to Admission medications   Medication Sig Start Date End Date Taking? Authorizing Provider  clotrimazole -betamethasone  (LOTRISONE ) cream APPLY 1 APPLICATION TOPICALLY TWICE A DAY AS NEEDED 05/26/23  Yes [name], MD  Continuous Glucose Sensor (DEXCOM G7 SENSOR) MISC Use 1 sensor for continuous glucose monitoring every 10 days for 30 days 05/25/23  Yes Braulio Hough, MD  FLUoxetine  (PROZAC ) 40 MG capsule TAKE 1 CAPSULE BY MOUTH TWICE A DAY 07/26/23  Yes [name], MD  furosemide  (LASIX ) 20 MG tablet Take 60 mg by mouth daily. 06/29/23 06/28/24 Yes [provider]  HUMULIN  R U-500 KWIKPEN 500 UNIT/ML KwikPen Use 120 units at breakfast, 100 at lunch, and 100 at dinner Patient taking differently: Use 60 units at breakfast, 65 at lunch, and 65 at dinner 04/08/22  Yes [name], MD  ibuprofen  (ADVIL ) 800 MG tablet Take 800 mg by mouth every 8 (eight) hours as needed for moderate pain (pain score 4-6). 09/21/23  Yes [provider]  lactulose, encephalopathy, (CHRONULAC) 10 GM/15ML SOLN Take 10 g by mouth 2 (two) times daily. 07/12/23 07/11/24 Yes [provider]  montelukast  (SINGULAIR ) 10 MG tablet Take 1 tablet (10 mg total) by mouth at bedtime. 08/25/22  Yes [name], MD  mupirocin  ointment (BACTROBAN ) 2 % Apply 1 Application topically 2 (two) times daily. 06/28/23  Yes Janit Thresa HERO, DPM  omeprazole  (PRILOSEC) 40 MG capsule TAKE 1 CAPSULE (40 MG TOTAL) BY MOUTH IN THE MORNING 07/25/23  Yes Danis, Victory LITTIE DOUGLAS, MD  ondansetron  (ZOFRAN -ODT) 4 MG disintegrating tablet Take 1 tablet (4 mg total) by mouth every 8 (eight) hours as needed for nausea or vomiting. 02/09/23  Yes Becki Krabbe, FNP  propranolol  (INDERAL ) 60 MG tablet Take 1 tablet (60 mg total) by mouth 2 (two) times daily. 02/23/23  Yes Gayland Lauraine PARAS, NP  spironolactone (ALDACTONE) 50 MG tablet Take 150 mg by mouth daily. 08/23/23  Yes [provider]   tirzepatide  (MOUNJARO ) 7.5 MG/0.5ML Pen Inject 7.5 mg  Subcutaneous once a week 30 days 07/06/23  Yes   albuterol  (VENTOLIN  HFA) 108 (90 Base) MCG/ACT inhaler INHALE 2 PUFFS INTO THE LUNGS UP TO EVERY 6 HOURS AS NEEDED FOR WHEEZING/SHORTNESS OF BREATH Patient not taking: Reported on 10/04/2023 08/20/22   [name], MD  colchicine  0.6 MG tablet Take 1 tablet (0.6 mg total) by mouth daily. Patient not taking: Reported on 07/19/2023 06/28/23   Janit Thresa HERO, DPM  Continuous Blood Gluc Sensor (FREESTYLE LIBRE 14 DAY SENSOR) MISC Apply topically every 14 (fourteen) days. Patient not taking: Reported on 07/19/2023 12/26/19   [provider]  Continuous Glucose Sensor (FREESTYLE LIBRE 3 PLUS SENSOR) MISC Change every 15 days. Patient not taking: Reported on 07/19/2023 05/23/23     Continuous Glucose Sensor (FREESTYLE LIBRE 3 SENSOR) MISC Apply sensor to upper back of arm, change sensor every 14 days. Patient not taking: Reported on 07/19/2023 12/30/22   Braulio Hough, MD  LORazepam  (ATIVAN ) 0.5 MG tablet Take 1 tablet (0.5 mg total) by mouth 2 (two) times daily as needed for anxiety. Patient not taking: Reported on 06/28/2023 12/29/22   [name], MD  Sodium Sulfate-Mag Sulfate-KCl (SUTAB ) [phone] MG TABS Use as directed for colonoscopy. MANUFACTURER CODES!! BIN: [id number] PCN: CN GROUP: [id number] MEMBER ID: [id number];MLW AS SECONDARY INSURANCE ;NO PRIOR AUTHORIZATION Patient not taking: Reported on 10/05/2023 07/29/23   Kerman Vina HERO, NP  tirzepatide  (MOUNJARO ) 10 MG/0.5ML Pen Inject 10 mg into the skin once a week. Patient not taking: Reported on 10/05/2023 02/16/23     tirzepatide  (MOUNJARO ) 10 MG/0.5ML Pen Inject 10 mg into the skin once a week. Patient not taking: Reported on 10/05/2023 05/23/23       Current Facility-Administered Medications  Medication Dose Route Frequency Provider Last Rate Last Admin   0.9 %  sodium chloride  infusion  250 mL Intravenous PRN Sundil, Subrina, MD        acetaminophen  (TYLENOL ) tablet 650 mg  650 mg Oral Q6H PRN Sundil, Subrina, MD       Or   acetaminophen  (TYLENOL ) suppository 650 mg  650 mg Rectal Q6H PRN Sundil, Subrina, MD       albuterol  (PROVENTIL ) (2.5 MG/3ML) 0.083% nebulizer solution 2.5 mg  2.5 mg Nebulization Q6H PRN Sundil, Subrina, MD       ceFEPIme (MAXIPIME) 2 g in sodium chloride  0.9 % 100 mL IVPB  2 g Intravenous Q8H Laron Agent, RPH 200 mL/hr at 10/05/23 1222 2 g at 10/05/23 1222   FLUoxetine  (PROZAC ) capsule 40 mg  40 mg Oral BID Sundil, Subrina, MD   40 mg at 10/05/23 1119   HYDROcodone -acetaminophen  (NORCO/VICODIN) 5-325 MG per tablet 1 tablet  1 tablet Oral Q4H PRN Mansy, Jan A, MD   1 tablet at 10/05/23 0250   insulin  aspart (novoLOG ) injection 0-15 Units  0-15 Units Subcutaneous TID WC Sheikh, Omair Latif, DO       insulin  aspart (novoLOG ) injection 0-5 Units  0-5 Units Subcutaneous QHS Sheikh, Omair Latif, DO       insulin  regular human CONCENTRATED (HUMULIN  R) 500 UNIT/ML KwikPen 30 Units  30 Units Subcutaneous TID WC Sheikh, Omair Latif, DO       lactulose (CHRONULAC) 10 GM/15ML solution 30 g  30 g Oral TID Sundil, Subrina, MD   30 g at 10/05/23 1119   metroNIDAZOLE (FLAGYL) IVPB 500 mg  500 mg Intravenous Q12H Laron Agent, RPH 100 mL/hr at 10/05/23 0347 500 mg at 10/05/23 0347   montelukast  (SINGULAIR ) tablet 10  mg  10 mg Oral QHS Sundil, Subrina, MD   10 mg at 10/05/23 0221   ondansetron  (ZOFRAN ) tablet 4 mg  4 mg Oral Q6H PRN Sundil, Subrina, MD       Or   ondansetron  (ZOFRAN ) injection 4 mg  4 mg Intravenous Q6H PRN Sundil, Subrina, MD       pantoprazole  (PROTONIX ) EC tablet 40 mg  40 mg Oral Daily Sundil, Subrina, MD   40 mg at 10/05/23 1119   sodium chloride  flush (NS) 0.9 % injection 3 mL  3 mL Intravenous Q12H Sundil, Subrina, MD       sodium chloride  flush (NS) 0.9 % injection 3 mL  3 mL Intravenous Q12H Sundil, Subrina, MD   3 mL at 10/05/23 1120   sodium chloride  flush (NS) 0.9 % injection 3 mL  3 mL  Intravenous PRN Sundil, Subrina, MD        Allergies as of 10/04/2023 - Review Complete 10/04/2023  Allergen Reaction Noted   Vancomycin Itching 10/04/2023   Amoxicillin Hives and Itching 02/16/2011   Azithromycin   07/25/2017   Dulaglutide  Other (See Comments) 08/21/2020   Empagliflozin -metformin  hcl er Other (See Comments) and Swelling 08/21/2020   Januvia  [sitagliptin ] Other (See Comments) 11/18/2014   Latex  07/25/2017   Metformin   12/16/2022   Penicillins Hives 02/16/2011    Social History   Socioeconomic History   Marital status: Married    Spouse name: Adam   Number of children: 2   Years of education: Not on file   Highest education level: 12th grade  Occupational History   Occupation: Arts administrator and Nutrition    Employer: [employer]    Comment: works 20 hours per week  Tobacco Use   Smoking status: Never    Passive exposure: Current   Smokeless tobacco: Never  Vaping Use   Vaping status: Never Used  Substance and Sexual Activity   Alcohol use: No    Alcohol/week: 0.0 standard drinks of alcohol   Drug use: No   Sexual activity: Yes    Partners: Male    Birth control/protection: None  Other Topics Concern   Not on file  Social History Narrative   Not on file   Social Drivers of Health   Financial Resource Strain: Low Risk  (09/19/2022)   Overall Financial Resource Strain (CARDIA)    Difficulty of Paying Living Expenses: Not hard at all  Food Insecurity: No Food Insecurity (10/05/2023)   Hunger Vital Sign    Worried About Running Out of Food in the Last Year: Never true    Ran Out of Food in the Last Year: Never true  Transportation Needs: No Transportation Needs (10/05/2023)   PRAPARE - Administrator, Civil Service (Medical): No    Lack of Transportation (Non-Medical): No  Physical Activity: Insufficiently Active (06/04/2021)   Exercise Vital Sign    Days of Exercise per Week: 2 days    Minutes of Exercise per Session: 10 min  Stress:  Stress Concern Present (09/19/2022)   [employer] of Occupational Health - Occupational Stress Questionnaire    Feeling of Stress : To some extent  Social Connections: Socially Integrated (10/05/2023)   Social Connection and Isolation Panel    Frequency of Communication with Friends and Family: More than three times a week    Frequency of Social Gatherings with Friends and Family: Twice a week    Attends Religious Services: More than 4 times per year    Active  Member of Clubs or Organizations: No    Attends Engineer, structural: More than 4 times per year    Marital Status: Married  Catering manager Violence: Not At Risk (10/05/2023)   Humiliation, Afraid, Rape, and Kick questionnaire    Fear of Current or Ex-Partner: No    Emotionally Abused: No    Physically Abused: No    Sexually Abused: No     Code Status   Code Status: Full Code  Review of Systems: All systems reviewed and negative except where noted in HPI.  Physical Exam: Vital signs in last 24 hours: Temp:  [97.4 F (36.3 C)-100.5 F (38.1 C)] 97.5 F (36.4 C) (06/25 0903) Pulse Rate:  [73-86] 78 (06/25 1352) Resp:  [14-26] 18 (06/25 1352) BP: (97-112)/(52-64) 104/58 (06/25 1352) SpO2:  [95 %-100 %] 99 % (06/25 1352) Weight:  [99.8 kg] 99.8 kg (06/24 1940) Last BM Date : 10/05/23  General:  Pleasant female in NAD Psych:  Cooperative. Normal mood and affect Eyes: Pupils equal Ears:  Normal auditory acuity Nose: No deformity, discharge or lesions Neck:  Supple, no masses felt Lungs:  Clear to auscultation.  Heart:  Regular rate, regular rhythm.  Abdomen:  Soft, distended, nontender, active bowel sounds, no masses felt Rectal :  Deferred Msk: Symmetrical without gross deformities.  Neurologic:  Alert, oriented, grossly normal neurologically.  No asterixis Extremities : No edema Skin:  Intact without significant lesions.    Intake/Output from previous day: 06/24 0701 - 06/25 0700 In: 440  [P.O.:240; IV Piggyback:200] Out: 100 [Urine:100] Intake/Output this shift:  No intake/output data recorded.   [name], NP-C   10/05/2023, 2:55 PM

## 2023-10-05 NOTE — Procedures (Signed)
 PROCEDURE SUMMARY:  Successful image-guided paracentesis from the left abdomen.  Yielded 3 liters of clear, yellow fluid.  No immediate complications.  EBL: zero Patient tolerated well.   Specimen  sent for labs.  Please see imaging section of Epic for full dictation.  Jhayden Demuro NP 10/05/2023 1:18 PM

## 2023-10-05 NOTE — Telephone Encounter (Signed)
 Received email from cynthia at surgery center stating pt is canceling surgery as she is currently in the hospital.  I did call and leave a message for pt to call me back that I needed to talk with her directly and possibly r/s surgery.

## 2023-10-05 NOTE — Hospital Course (Addendum)
 The patient is a 43 year old obese chronically ill-appearing Caucasian female with past medical history significant for but not limited to [place] cirrhosis, history of esophageal varices, chronic, cytopenia, frequent UTIs, depression and anxiety, diabetes mellitus type 2, GERD, history of menorrhagia, peripheral neuropathy and other comorbidities including recent dental extraction who presented with shortness of breath to the ED.  She had been noncompliant with her diuretics and had a tooth extraction and has been taking antibiotics after tooth extraction.  Further workup was done and she is found to be febrile, hypotensive and tachypneic with requirement of supplemental oxygen.  She is found to have decompensated hepatic cirrhosis and acute diastolic CHF.  Subsequently she underwent a paracentesis and GI was consulted.  Husband notes that she has been notably more depressed to the point where she lays in bed most of the time so Psychiatry evaluated and recommending outpatient follow-up with continuation of her current medications.  Further studies show that she is positive for Campylobacter and C. difficile so she will just be continued on Pradaxa mycin for now.  Her home diuretics have been resumed.  Will do an amatory home O2 screen prior to discharge  Assessment and Plan:  Decompensated Hepatic Cirrhosis / Hyperbilirubinemia Acute metabolic encephalopathy in the setting of hyperammonemia History of portal hypertensive gastropathy History of esophageal varices -Patient presented emergency department complaining of increased confusion, shortness of breath, cough, dysuria, generalized weakness, abdominal discomfort and poor appetite.  At presentation to ED patient found hypotensive, and low-grade fever.  O2 sat dropped to 88% has been placed on nasal cannula oxygen.  Patient reported she has not been taking Lasix  and spironolactone  for almost 1 week. -CT abdomen pelvis showing no evidence of PE, mild  bibasilar atelectasis, cirrhotic liver with sequela of portal hypertension including splenomegaly, varices and large volume ascites.  Notably there is also diffuse wall thickening of the ascending, transverse and descending colon which was worrisome for colitis -CMP showing elevated AST and elevated bilirubin.  Elevated ammonia 89 on admission. - Decompensated hepatic cirrhosis with ascites in the setting of medication noncompliance. - Given patient is hypotensive holding Lasix  and is bilateral at this time. - Consulting IR for paracentesis and will send paracentesis fluid study. -Was given albumin  50 g and midodrine  10 mg yesterday in the setting of hypotension - Starting lactulose  30 g 3 times daily was reduced to 20 g twice daily.  Goal to have at least 2-3 bowel bowel movements in a day. -Holding propranolol  in the setting of low blood pressure. -GI consulted for further evaluation.  Her MELD 3.0 score was 22 and she has no evidence of SBP.  GI is recommending 2 g sodium restricted salt diet; her home diuretics with Lasix  60 mg p.o. daily and spironolactone  150 mg p.o. daily were resumed this morning  Sepsis secondary to colitis and suspect this is related to the C. Difficile and Campylobacter  -Patient meets sepsis criteria in the setting of tachycardia, low-grade fever and hypotension.  CT abdomen pelvis showed evidence of colitis as above CTA chest ruled out PE and pneumonia however it showed bibasilar atelectasis. - Blood cultures are pending.  Lactic acid within normal range. - History of penicillin allergy.  Pharmacy recommended change Zosyn to cefepime /metronidazole  but will discontinue this and transition to Dificid  given that her C. difficile antigen was positive and her toxigenic C. difficile via PCR was also positive -GI pathogen panel positive for Campylobacter -After giving vancomycin  patient started having rash of the forehead, upper chest  wall with associated itchiness.  Patient  also seemed flushed with redness of the cheeks.  No evidence of new respiratory distress, stridor.   There was concern for development of allergic reaction or early development of red man syndrome in setting of vancomycin  use. -Giving IV Benadryl  and Solu-Medrol .  DC vancomycin  completely. -Continue to monitor for any temperatures and follow WBC curve   Left ovarian hemorrhagic cyst: Noted to be 4.8 cm and can be further evaluated with a pelvic ultrasound but will defer for now   Acute hypoxic respiratory failure in the setting of bibasilar atelectasis and volume overload from acute on chronic diastolic dysfunction: Respiratory Virus Panel Negative as well as COVID.  Chest x-ray and CT chest showed bilateral atelectasis.  In the ED patient O2 sat dropped below 88% placed on nasal cannula oxygen. -Patient being volume overloaded with ascites in the setting of medication noncompliance.  Due to low blood pressure was unable to start Lasix  and spironolactone  at this time.  Giving albumin  and plan for paracentesis.  Paracentesis done and removed 3 L. Given dose of IV Lasix  40 mg 6/25. Resumed Home Diuretics now CTM for S/Sx of Volume overload -Continue supportive care, supplemental oxygen, spirometry, flutter valve and wean down oxygen to room air as patient tolerates. Will need Ambulatory Home O2 Screen and repeat CXR in the AM.   Recent Dental Extraction:  Reported recent dental extraction have not received any prophylactic antibiotic.  Physical exam showed murmur.  Echocardiogram to rule out vegetation and this is done and showed no Vegetation but did show G2DD  Hypokalemia: Mild. K+ was 3.1. Replete w/ po KCL 40 mEQ BID x2. CTM and Replete as Necessary. Repeat CMP in the AM    Depression / Generalized Anxiety Disorder: C/w Fluoxetine  40 mg po BID. husband believes that she is severely depressed and has not been engaging in very much activities has been laying in bed most of the time.  Psychiatry has  been consulted for evaluation and management given that the patient's husband states that this is causing structure between her marriage.  Psychiatry consulted patient did endorse some mood changes but states that she is not as depressed and denied any SI/HI/AVH.  Psychiatry recommended continuing full duloxetine 40 mg twice daily and outpatient psychiatric services and follow-up  Reactive airway disease: Continue Albuterol  2.5 mg Neb q6hprn Wheezing and SOB and Montelukast  10 mg qHS daily.  Insulin  Dependent Diabetes Mellitus Type 2: HbA1c was 6.1. Received IV Solumedrol yesterday and now CBGs elevated. C/w Moderate Novolog  [facility] AC/HS and Diabetes Education Coordinator consulted and recommending Humulin  U-500 30 Units TID w/ Meals but given her continued hyperglycemia will increase to 40 U. CTM CBGs per Protocol. CBGs ranging from 157-337  Pancytopenia: Fluctuating, in the setting of NASH Cirrhosis and infection from above. Has Chronic Thrombocytopenia 2/2 to Cirrhosis. CBC Trend: Recent Labs  Lab 10/04/23 2002 10/04/23 2208 10/05/23 0459 10/06/23 0552 10/07/23 0717  WBC 4.4  --  2.9* 4.6 3.1*  HGB 10.7*   < > 9.7* 9.8* 10.4*  HCT 31.3*   < > 28.7* 28.0* 30.0*  MCV 93.4  --  95.7 94.6 95.2  PLT 69*  --  49* 58* 66*   < > = values in this interval not displayed.  -Checked Anemia Panel and showed an iron level of 46, UIBC 165, TIBC of 211, saturation ratios of 22%, ferritin level of 108. CTM for S/Sx of Bleeding; no overt bleeding noted. CTM and Trend and repeat  CMP in the AM  GERD/GI Prophylaxis: C/w Pantoprazole  40 mg po daily   Hypoalbuminemia: Patient's Albumin  Level went from 2.4 -> 3.1 -> 2.8. CTM and Trend and repeat CMP in the AM  Class II Obesity -Complicates overall prognosis and care -Estimated body mass index is 35.51 kg/m as calculated from the following:   Height as of this encounter: 5' 6 (1.676 m).   Weight as of this encounter: 99.8 kg.  -Weight Loss and Dietary  Counseling given

## 2023-10-05 NOTE — Telephone Encounter (Signed)
 Pts husband called back and left message for me to call him back. Pt lhas been admitted so will not be able to have surgery tomorrow.   I returned call and spoke to Juliene pts husband. Pt was taken to ER at [facility] last night and has been admitted. He will get pt to r/s surgery when she is able to. He said it maybe a while because she is really sick.

## 2023-10-05 NOTE — Inpatient Diabetes Management (Signed)
 Inpatient Diabetes Program Recommendations  AACE/ADA: New Consensus Statement on Inpatient Glycemic Control (2015)  Target Ranges:  Prepandial:   less than 140 mg/dL      Peak postprandial:   less than 180 mg/dL (1-2 hours)      Critically ill patients:  140 - 180 mg/dL   Lab Results  Component Value Date   GLUCAP 353 (H) 10/05/2023   HGBA1C 6.1 (H) 10/05/2023    Review of Glycemic Control  Diabetes history: type 2 Outpatient Diabetes medications: Humulin  U-500 insulin  35-45 units TID per meal, Mounjaro  7.5 mg week Current orders for Inpatient glycemic control: Novolog  0-20 units correction scale TID, Novolog  0-5 units HS scale  Inpatient Diabetes Program Recommendations:   Spoke with patient at the bedside. Patient states that she takes Humulin  U-500 insulin  35-45 units TID with meals per sliding scale. She does not eat much per meal due to Mounjaro .  Recommend: Humulin  U-500 30 units TID with meals (starting with dinner tonight) Novolog  0-15 units correction scale TID, Novolog  0-5 units HS scale Titrate dosages as needed.  Marjorie Lunger RN BSN CDE Diabetes Coordinator Pager: [phone]  8am-5pm

## 2023-10-05 NOTE — Progress Notes (Signed)
 PROGRESS NOTE    Laurie Sutton  FMW:[id number] DOB: 03-31-1981 DOA: 10/04/2023 PCP: [name], MD   Brief Narrative:  The patient is a 43 year old obese chronically ill-appearing Caucasian female with past medical history significant for but not limited to [place] cirrhosis, history of esophageal varices, chronic, cytopenia, frequent UTIs, depression and anxiety, diabetes mellitus type 2, GERD, history of menorrhagia, peripheral neuropathy and other comorbidities including recent dental extraction who presented with shortness of breath to the ED.  She had been noncompliant with her diuretics and had a tooth extraction and has been taking antibiotics after tooth extraction.  Further workup was done and she is found to be febrile, hypotensive and tachypneic with requirement of supplemental oxygen.  She is found to have decompensated hepatic cirrhosis and acute diastolic CHF.  Subsequently she underwent a paracentesis and GI was consulted.  Husband notes that she has been notably more depressed to the point where she lays in bed most of the time so Psychiatry is also been consulted.  Assessment and Plan:  Decompensated Hepatic Cirrhosis / Hyperbilirubinemia Acute metabolic encephalopathy in the setting of hyperammonemia History of portal hypertensive gastropathy History of esophageal varices -Patient presented emergency department complaining of increased confusion, shortness of breath, cough, dysuria, generalized weakness, abdominal discomfort and poor appetite.  At presentation to ED patient found hypotensive, and low-grade fever.  O2 sat dropped to 88% has been placed on nasal cannula oxygen.  Patient reported she has not been taking Lasix  and spironolactone for almost 1 week. -CT abdomen pelvis showing no evidence of PE, mild bibasilar atelectasis, cirrhotic liver with sequela of portal hypertension including splenomegaly, varices and large volume ascites.  Notably there is also diffuse wall  thickening of the ascending, transverse and descending colon which was worrisome for colitis -CMP showing elevated AST and elevated bilirubin.  Elevated ammonia 89 on admission. - Decompensated hepatic cirrhosis with ascites in the setting of medication noncompliance. - Given patient is hypotensive holding Lasix  and is bilateral at this time. - Consulting IR for paracentesis and will send paracentesis fluid study. -Giving albumin 50 g and midodrine 10 mg yesterday in the setting of hypotension - Starting lactulose 30 g 3 times daily was reduced to 20 g twice daily.  Goal to have at least 2-3 bowel bowel movements in a day. -Holding propranolol  in the setting of low blood pressure. -GI consulted for further evaluation.  Her MELD 3.0 score was 22 and she has no evidence of SBP.  GI is recommending 2 g sodium restricted salt diet, resumption of diuretics likely tomorrow and close follow-up.  Sepsis secondary to colitis and suspect this is related to the C. difficile -Patient meets sepsis criteria in the setting of tachycardia, low-grade fever and hypotension.  CT abdomen pelvis showed evidence of colitis as above CTA chest ruled out PE and pneumonia however it showed bibasilar atelectasis. - Blood cultures are pending.  Lactic acid within normal range. - History of penicillin allergy.  Pharmacy recommended change Zosyn to cefepime/metronidazole but will discontinue this and transition to Dificid given that her C. difficile antigen was positive and her toxigenic C. difficile via PCR was also positive -GI pathogen panel still pending -After giving vancomycin patient started having rash of the forehead, upper chest wall with associated itchiness.  Patient also seemed flushed with redness of the cheeks.  No evidence of new respiratory distress, stridor.   There was concern for development of allergic reaction or early development of red man syndrome in setting  of vancomycin use. -Giving IV Benadryl  and  Solu-Medrol .  DC vancomycin completely. -Continue to monitor for any temperatures and follow WBC curve   Left ovarian hemorrhagic cyst: Noted to be 4.8 cm and can be further evaluated with a pelvic ultrasound but will defer for now   Acute hypoxic respiratory failure in the setting of bibasilar atelectasis and volume overload from acute on chronic diastolic dysfunction: Respiratory Virus Panel Negative as well as COVID.  Chest x-ray and CT chest showed bilateral atelectasis.  In the ED patient O2 sat dropped below 88% placed on nasal cannula oxygen. -Patient being volume overloaded with ascites in the setting of medication noncompliance.  Due to low blood pressure was unable to start Lasix  and spironolactone at this time.  Giving albumin and plan for paracentesis.  Paracentesis done and removed 3 L. Give dose of IV Lasix  40 mg tonight. CTM for S/Sx of Volume overload -Continue supportive care, supplemental oxygen, spirometry, flutter valve and wean down oxygen to room air as patient tolerates.  Resume oral diuretics in the a.m.   Recent Dental Extraction:  Reported recent dental extraction have not received any prophylactic antibiotic.  Physical exam showed murmur.  Echocardiogram to rule out vegetation and this is done and showed no Vegetation but did show G2DD   Depression / Generalized Anxiety Disorder: C/w Fluoxetine  40 mg po BID. husband believes that she is severely depressed and has not been engaging in very much activities has been laying in bed most of the time.  Psychiatry has been consulted for evaluation and management given that the patient's husband states that this is causing structure between her marriage  Reactive airway disease: Continue Albuterol  2.5 mg Neb q6hprn Wheezing and SOB and Montelukast  10 mg qHS daily.  Insulin  Dependent Diabetes Mellitus Type 2: HbA1c was 6.1. Received IV Solumedrol yesterday and now CBGs elevated. C/w Moderate Novolog  [facility] AC/HS and Diabetes Education  Coordinator consulted and recommending Humulin  U-500 30 Units TID w/ Meals (start w/ Dinner). CTM CBGs per Protocol. CBGs ranging from 201-431  Pancytopenia: In the setting of NASH Cirrhosis. Has Chronic Thrombocytopenia 2/2 to Cirrhosis. CBC Trend: Recent Labs  Lab 10/04/23 2002 10/04/23 2208 10/05/23 0459  WBC 4.4  --  2.9*  HGB 10.7*   < > 9.7*  HCT 31.3*   < > 28.7*  MCV 93.4  --  95.7  PLT 69*  --  49*   < > = values in this interval not displayed.  -Check Anemia Panel in the AM. CTM for S/Sx of Bleeding; no overt bleeding noted. CTM and Trend and repeat CMP in the AM  GERD/GI Prophylaxis: C/w Pantoprazole  40 mg po daily   Hypoalbuminemia: Patient's Albumin Level went from 2.4 -> 3.1. CTM and Trend and repeat CMP in the AM  Class II Obesity -Complicates overall prognosis and care -Estimated body mass index is 35.51 kg/m as calculated from the following:   Height as of this encounter: 5' 6 (1.676 m).   Weight as of this encounter: 99.8 kg.  -Weight Loss and Dietary Counseling given   DVT prophylaxis: SCDs Start: 10/04/23 2259 Place TED hose Start: 10/04/23 2259    Code Status: Full Code Family Communication: D/w Husband over the telephone  Disposition Plan:  Level of care: Telemetry Medical Status is: Inpatient Remains inpatient appropriate because: Needs further clinical improvement and clearance by specialists   Consultants:  Gastroenterology Psychiatry  Procedures:  As delineated above  Antimicrobials:  Anti-infectives (From admission, onward)  Start     Dose/Rate Route Frequency Ordered Stop   10/05/23 2200  fidaxomicin (DIFICID) tablet 200 mg        200 mg Oral 2 times daily 10/05/23 1511 10/15/23 2159   10/05/23 1000  vancomycin (VANCOREADY) IVPB 750 mg/150 mL  Status:  Discontinued        750 mg 75 mL/hr over 120 Minutes Intravenous Every 12 hours 10/04/23 2313 10/04/23 2353   10/05/23 0400  ceFEPIme (MAXIPIME) 2 g in sodium chloride  0.9 % 100 mL  IVPB  Status:  Discontinued        2 g 200 mL/hr over 30 Minutes Intravenous Every 8 hours 10/04/23 2313 10/05/23 1512   10/05/23 0400  metroNIDAZOLE (FLAGYL) IVPB 500 mg  Status:  Discontinued        500 mg 100 mL/hr over 60 Minutes Intravenous Every 12 hours 10/04/23 2313 10/05/23 1511   10/04/23 2100  vancomycin (VANCOREADY) IVPB 2000 mg/400 mL        2,000 mg 200 mL/hr over 120 Minutes Intravenous  Once 10/04/23 2055 10/04/23 2334   10/04/23 2045  cefTRIAXone (ROCEPHIN) 2 g in sodium chloride  0.9 % 100 mL IVPB        2 g 200 mL/hr over 30 Minutes Intravenous  Once 10/04/23 2041 10/04/23 2107       Subjective: Seen and examined at bedside and was awake and alert and oriented.  No nausea or vomiting.  Having some abdominal discomfort.  States that she is having worsening shortness of breath.  Denies any chest discomfort.  No other concerns or complaints at this time.  Objective: Vitals:   10/05/23 0903 10/05/23 1312 10/05/23 1352 10/05/23 1748  BP: 105/63 112/64 (!) 104/58 (!) 116/59  Pulse: 77  78 75  Resp: 18  18   Temp: (!) 97.5 F (36.4 C)   98 F (36.7 C)  TempSrc: Oral     SpO2: 99%  99% 100%  Weight:      Height:        Intake/Output Summary (Last 24 hours) at 10/05/2023 1902 Last data filed at 10/05/2023 0510 Gross per 24 hour  Intake 440 ml  Output 100 ml  Net 340 ml   Filed Weights   10/04/23 1940  Weight: 99.8 kg   Examination: Physical Exam:  Constitutional: WN/WD obese Caucasian female who appears calm Respiratory: Diminished to auscultation bilaterally with some coarse breath sounds does have some slight crackles and minimal rhonchi but no appreciable wheezing or rales. Normal respiratory effort and patient is not tachypenic. No accessory muscle use.  Cardiovascular: RRR, no murmurs / rubs / gallops. S1 and S2 auscultated.  Is 1+ lower extremity pitting edema Abdomen: Soft, slightly tender to palpate.  Distended secondary to body habitus and ascites.  Bowel sounds positive.  GU: Deferred. Musculoskeletal: No clubbing / cyanosis of digits/nails. No joint deformity upper and lower extremities.  Skin: No rashes, lesions, ulcers on limited skin evaluation. No induration; Warm and dry.  Neurologic: CN 2-12 grossly intact with no focal deficits. Romberg sign and cerebellar reflexes not assessed.  Psychiatric: Is awake and alert and oriented  Data Reviewed: I have personally reviewed following labs and imaging studies  CBC: Recent Labs  Lab 10/04/23 2002 10/04/23 2208 10/05/23 0459  WBC 4.4  --  2.9*  NEUTROABS 3.5  --   --   HGB 10.7* 9.2* 9.7*  HCT 31.3* 27.0* 28.7*  MCV 93.4  --  95.7  PLT 69*  --  49*   Basic Metabolic Panel: Recent Labs  Lab 10/04/23 2002 10/04/23 2208 10/05/23 0459  NA 135 137 132*  K 4.0 3.9 4.4  CL 107  --  106  CO2 20*  --  20*  GLUCOSE 213*  --  420*  BUN 11  --  12  CREATININE 0.89  --  1.00  CALCIUM 8.2*  --  8.3*   GFR: Estimated Creatinine Clearance: 87.3 mL/min (by C-G formula based on SCr of 1 mg/dL). Liver Function Tests: Recent Labs  Lab 10/04/23 2002 10/05/23 0459  AST 58* 54*  ALT 31 30  ALKPHOS 85 76  BILITOT 2.7* 3.6*  PROT 6.4* 6.8  ALBUMIN 2.4* 3.1*   No results for input(s): LIPASE, AMYLASE in the last 168 hours. Recent Labs  Lab 10/04/23 2055  AMMONIA 89*   Coagulation Profile: Recent Labs  Lab 10/04/23 2002 10/05/23 0459  INR 1.5* 1.8*   Cardiac Enzymes: No results for input(s): CKTOTAL, CKMB, CKMBINDEX, TROPONINI in the last 168 hours. BNP (last 3 results) No results for input(s): PROBNP in the last 8760 hours. HbA1C: Recent Labs    10/05/23 0459  HGBA1C 6.1*   CBG: Recent Labs  Lab 10/04/23 2344 10/05/23 0125 10/05/23 0755 10/05/23 1119 10/05/23 1624  GLUCAP 173* 201* 431* 353* 384*   Lipid Profile: No results for input(s): CHOL, HDL, LDLCALC, TRIG, CHOLHDL, LDLDIRECT in the last 72 hours. Thyroid  Function  Tests: No results for input(s): TSH, T4TOTAL, FREET4, T3FREE, THYROIDAB in the last 72 hours. Anemia Panel: No results for input(s): VITAMINB12, FOLATE, FERRITIN, TIBC, IRON, RETICCTPCT in the last 72 hours. Sepsis Labs: Recent Labs  Lab 10/04/23 2010  LATICACIDVEN 1.7    Recent Results (from the past 240 hours)  Culture, blood (Routine x 2)     Status: None (Preliminary result)   Collection Time: 10/04/23  7:50 PM   Specimen: BLOOD LEFT ARM  Result Value Ref Range Status   Specimen Description BLOOD LEFT ARM  Final   Special Requests   Final    BOTTLES DRAWN AEROBIC AND ANAEROBIC Blood Culture adequate volume   Culture   Final    NO GROWTH < 12 HOURS Performed at [facility], [address], [place], [place] [address]    Report Status PENDING  Incomplete  Resp panel by RT-PCR (RSV, Flu A&B, Covid) Anterior Nasal Swab     Status: None   Collection Time: 10/04/23  8:50 PM   Specimen: Anterior Nasal Swab  Result Value Ref Range Status   SARS Coronavirus 2 by RT PCR NEGATIVE NEGATIVE Final   Influenza A by PCR NEGATIVE NEGATIVE Final   Influenza B by PCR NEGATIVE NEGATIVE Final    Comment: (NOTE) The Xpert Xpress SARS-CoV-2/FLU/RSV plus assay is intended as an aid in the diagnosis of influenza from Nasopharyngeal swab specimens and should not be used as a sole basis for treatment. Nasal washings and aspirates are unacceptable for Xpert Xpress SARS-CoV-2/FLU/RSV testing.  Fact Sheet for Patients: BloggerCourse.com  Fact Sheet for Healthcare Providers: SeriousBroker.it  This test is not yet approved or cleared by the [place]  FDA and has been authorized for detection and/or diagnosis of SARS-CoV-2 by FDA under an Emergency Use Authorization (EUA). This EUA will remain in effect (meaning this test can be used) for the duration of the COVID-19 declaration under Section 564(b)(1) of the Act, 21  U.S.C. section 360bbb-3(b)(1), unless the authorization is terminated or revoked.     Resp Syncytial Virus by  PCR NEGATIVE NEGATIVE Final    Comment: (NOTE) Fact Sheet for Patients: BloggerCourse.com  Fact Sheet for Healthcare Providers: SeriousBroker.it  This test is not yet approved or cleared by the [place]  FDA and has been authorized for detection and/or diagnosis of SARS-CoV-2 by FDA under an Emergency Use Authorization (EUA). This EUA will remain in effect (meaning this test can be used) for the duration of the COVID-19 declaration under Section 564(b)(1) of the Act, 21 U.S.C. section 360bbb-3(b)(1), unless the authorization is terminated or revoked.  Performed at [facility], [address] [place], [place] [address]   Respiratory (~20 pathogens) panel by PCR     Status: None   Collection Time: 10/04/23  8:50 PM   Specimen: Nasopharyngeal Swab; Respiratory  Result Value Ref Range Status   Adenovirus NOT DETECTED NOT DETECTED Final   Coronavirus 229E NOT DETECTED NOT DETECTED Final    Comment: (NOTE) The Coronavirus on the Respiratory Panel, DOES NOT test for the novel  Coronavirus (2019 nCoV)    Coronavirus HKU1 NOT DETECTED NOT DETECTED Final   Coronavirus NL63 NOT DETECTED NOT DETECTED Final   Coronavirus OC43 NOT DETECTED NOT DETECTED Final   Metapneumovirus NOT DETECTED NOT DETECTED Final   Rhinovirus / Enterovirus NOT DETECTED NOT DETECTED Final   Influenza A NOT DETECTED NOT DETECTED Final   Influenza B NOT DETECTED NOT DETECTED Final   Parainfluenza Virus 1 NOT DETECTED NOT DETECTED Final   Parainfluenza Virus 2 NOT DETECTED NOT DETECTED Final   Parainfluenza Virus 3 NOT DETECTED NOT DETECTED Final   Parainfluenza Virus 4 NOT DETECTED NOT DETECTED Final   Respiratory Syncytial Virus NOT DETECTED NOT DETECTED Final   Bordetella pertussis NOT DETECTED NOT DETECTED Final   Bordetella Parapertussis  NOT DETECTED NOT DETECTED Final   Chlamydophila pneumoniae NOT DETECTED NOT DETECTED Final   Mycoplasma pneumoniae NOT DETECTED NOT DETECTED Final    Comment: Performed at [facility], [address] [place], [place] [address]  C Difficile Quick Screen w PCR reflex     Status: Abnormal   Collection Time: 10/04/23 11:04 PM   Specimen: STOOL  Result Value Ref Range Status   C Diff antigen POSITIVE (A) NEGATIVE Final   C Diff toxin NEGATIVE NEGATIVE Final   C Diff interpretation Results are indeterminate. See PCR results.  Final    Comment: Performed at [facility], [address] [place], [place] [address]  C. Diff by PCR, Reflexed     Status: Abnormal   Collection Time: 10/04/23 11:04 PM  Result Value Ref Range Status   Toxigenic C. Difficile by PCR POSITIVE (A) NEGATIVE Final    Comment: Positive for toxigenic C. difficile with little to no toxin production. Only treat if clinical presentation suggests symptomatic illness. Performed at [facility], [address] [place], [place] [address]   Gram stain     Status: None   Collection Time: 10/05/23  1:13 PM   Specimen: Abdomen; Peritoneal Fluid  Result Value Ref Range Status   Specimen Description ABDOMEN  Final   Special Requests NONE  Final   Gram Stain   Final    NO WBC SEEN NO ORGANISMS SEEN Performed at [facility], [address] [place], [place] [address]    Report Status 10/05/2023 FINAL  Final    Radiology Studies: ECHOCARDIOGRAM COMPLETE Result Date: 10/05/2023    ECHOCARDIOGRAM REPORT   Patient Name:   Laurie Sutton Date  of Exam: 10/05/2023 Medical Rec #:  [id number]         Height:       66.0 in Accession #:    [id number]        Weight:       220.0 lb Date of Birth:  [birth year]        BSA:          2.082 m Patient Age:    42 years          BP:           106/54 mmHg Patient Gender: F                 HR:           71 bpm. Exam Location:  Inpatient Procedure: 2D Echo, Cardiac Doppler and  Color Doppler (Both Spectral and Color            Flow Doppler were utilized during procedure). Indications:    Murmor R01.1  History:        Patient has no prior history of Echocardiogram examinations.                 Migraines; Risk Factors:Diabetes.  Sonographer:    Thea Norlander RCS Referring Phys: SUBRINA SUNDIL IMPRESSIONS  1. Left ventricular ejection fraction, by estimation, is 60 to 65%. The left ventricle has normal function. The left ventricle has no regional wall motion abnormalities. Left ventricular diastolic parameters are consistent with Grade II diastolic dysfunction (pseudonormalization).  2. Right ventricular systolic function is normal. The right ventricular size is normal. There is normal pulmonary artery systolic pressure.  3. Left atrial size was moderately dilated.  4. The mitral valve is normal in structure. Trivial mitral valve regurgitation. No evidence of mitral stenosis.  5. The aortic valve is normal in structure. Aortic valve regurgitation is not visualized. No aortic stenosis is present.  6. The inferior vena cava is dilated in size with <50% respiratory variability, suggesting right atrial pressure of 15 mmHg. FINDINGS  Left Ventricle: Left ventricular ejection fraction, by estimation, is 60 to 65%. The left ventricle has normal function. The left ventricle has no regional wall motion abnormalities. The left ventricular internal cavity size was normal in size. There is  no left ventricular hypertrophy. Left ventricular diastolic parameters are consistent with Grade II diastolic dysfunction (pseudonormalization). Right Ventricle: The right ventricular size is normal. No increase in right ventricular wall thickness. Right ventricular systolic function is normal. There is normal pulmonary artery systolic pressure. The tricuspid regurgitant velocity is 2.47 m/s, and  with an assumed right atrial pressure of 10 mmHg, the estimated right ventricular systolic pressure is 34.4 mmHg. Left  Atrium: Left atrial size was moderately dilated. Right Atrium: Right atrial size was normal in size. Pericardium: There is no evidence of pericardial effusion. Mitral Valve: The mitral valve is normal in structure. Trivial mitral valve regurgitation. No evidence of mitral valve stenosis. Tricuspid Valve: The tricuspid valve is normal in structure. Tricuspid valve regurgitation is mild . No evidence of tricuspid stenosis. Aortic Valve: The aortic valve is normal in structure. Aortic valve regurgitation is not visualized. No aortic stenosis is present. Aortic valve peak gradient measures 14.4 mmHg. Pulmonic Valve: The pulmonic valve was normal in structure. Pulmonic valve regurgitation is not visualized. No evidence of pulmonic stenosis. Aorta: The aortic root is normal in size and structure. Venous: The inferior vena cava is dilated in size with less than 50% respiratory variability, suggesting right  atrial pressure of 15 mmHg. IAS/Shunts: No atrial level shunt detected by color flow Doppler.  LEFT VENTRICLE PLAX 2D LVIDd:         5.20 cm   Diastology LVIDs:         2.90 cm   LV e' medial:    8.70 cm/s LV PW:         0.70 cm   LV E/e' medial:  12.9 LV IVS:        0.80 cm   LV e' lateral:   13.30 cm/s LVOT diam:     2.20 cm   LV E/e' lateral: 8.4 LV SV:         112 LV SV Index:   54 LVOT Area:     3.80 cm  RIGHT VENTRICLE             IVC RV S prime:     17.10 cm/s  IVC diam: 2.20 cm TAPSE (M-mode): 3.0 cm LEFT ATRIUM              Index        RIGHT ATRIUM           Index LA diam:        4.60 cm  2.21 cm/m   RA Area:     15.50 cm LA Vol (A2C):   63.6 ml  30.54 ml/m  RA Volume:   39.30 ml  18.87 ml/m LA Vol (A4C):   116.0 ml 55.70 ml/m LA Biplane Vol: 89.5 ml  42.98 ml/m  AORTIC VALVE AV Area (Vmax): 2.54 cm AV Vmax:        190.00 cm/s AV Peak Grad:   14.4 mmHg LVOT Vmax:      127.00 cm/s LVOT Vmean:     85.600 cm/s LVOT VTI:       0.294 m  AORTA Ao Root diam: 3.00 cm Ao Asc diam:  3.30 cm MITRAL VALVE                 TRICUSPID VALVE MV Area (PHT): 2.91 cm     TR Peak grad:   24.4 mmHg MV Decel Time: 261 msec     TR Vmax:        247.00 cm/s MV E velocity: 112.00 cm/s MV A velocity: 89.10 cm/s   SHUNTS MV E/A ratio:  1.26         Systemic VTI:  0.29 m                             Systemic Diam: 2.20 cm Aditya Sabharwal Electronically signed by Ria Commander Signature Date/Time: 10/05/2023/5:35:57 PM    Final    IR Paracentesis Result Date: 10/05/2023 INDICATION: Patient with a history of MASH cirrhosis with recurrent ascites. Request for therapeutic paracentesis. EXAM: ULTRASOUND GUIDED DIAGNOSTIC AND THERAPEUTIC PARACENTESIS MEDICATIONS: 6 mL 1% lidocaine  COMPLICATIONS: None immediate. PROCEDURE: Informed written consent was obtained from the patient after a discussion of the risks, benefits and alternatives to treatment. A timeout was performed prior to the initiation of the procedure. Initial ultrasound scanning demonstrates a large amount of ascites within the left lower abdominal quadrant. The left lower abdomen was prepped and draped in the usual sterile fashion. 1% lidocaine  was used for local anesthesia. Following this, a 19 gauge, 7-cm, Yueh catheter was introduced. An ultrasound image was saved for documentation purposes. The paracentesis was performed. The catheter was removed and a dressing was applied. The  patient tolerated the procedure well without immediate post procedural complication. FINDINGS: A total of approximately 3 liters of clear, yellow fluid was removed. Samples were sent to the laboratory as requested by the clinical team. IMPRESSION: Successful ultrasound-guided paracentesis yielding 3 liters of peritoneal fluid. PLAN: If the patient eventually requires >/=2 paracenteses in a 30 day period, candidacy for formal evaluation by the [facility] will be assessed. Performed by Laymon Coast, NP Electronically Signed   By: Cordella Banner    On: 10/05/2023 15:29   CT Angio Chest PE W and/or Wo Contrast Result Date: 10/04/2023 CLINICAL DATA:  High probability for PE. Non alcoholic cirrhosis. Chills and weakness. Acute abdominal pain. EXAM: CT ANGIOGRAPHY CHEST CT ABDOMEN AND PELVIS WITH CONTRAST TECHNIQUE: Multidetector CT imaging of the chest was performed using the standard protocol during bolus administration of intravenous contrast. Multiplanar CT image reconstructions and MIPs were obtained to evaluate the vascular anatomy. Multidetector CT imaging of the abdomen and pelvis was performed using the standard protocol during bolus administration of intravenous contrast. RADIATION DOSE REDUCTION: This exam was performed according to the departmental dose-optimization program which includes automated exposure control, adjustment of the mA and/or kV according to patient size and/or use of iterative reconstruction technique. CONTRAST:  75mL OMNIPAQUE  IOHEXOL  350 MG/ML SOLN COMPARISON:  CT abdomen and pelvis 06/06/2023 FINDINGS: CTA CHEST FINDINGS Cardiovascular: Satisfactory opacification of the pulmonary arteries to the segmental level. No evidence of pulmonary embolism. Heart is borderline enlarged/mildly enlarged. No pericardial effusion. Mediastinum/Nodes: No enlarged mediastinal, hilar, or axillary lymph nodes. Thyroid  gland, trachea, and esophagus demonstrate no significant findings. Lungs/Pleura: There is mild atelectasis in the bilateral lower lobes and lingula. There is no pleural effusion or pneumothorax. Musculoskeletal: No chest wall abnormality. No acute or significant osseous findings. Review of the MIP images confirms the above findings. CT ABDOMEN and PELVIS FINDINGS Hepatobiliary: There is nodular liver contour, unchanged. Gallstones are present. There is no biliary ductal dilatation. Pancreas: Unremarkable. No pancreatic ductal dilatation or surrounding inflammatory changes. Spleen: Spleen is enlarged measuring up to 18 cm similar to  prior. Adrenals/Urinary Tract: There are 2 subcentimeter hypodensities in the left kidney which are too small to characterize, likely cysts. Otherwise, the kidneys, adrenal glands and bladder are within normal limits. Stomach/Bowel: There is diffuse wall thickening of the ascending colon, transverse colon and descending colon. The appendix is within normal limits. No bowel obstruction, pneumatosis or free air visualized. Stomach is within normal limits. Vascular/Lymphatic: Aorta and IVC are normal in size. Splenic varices are present. No enlarged lymph nodes are identified. Reproductive: 4.8 cm cyst with hyperdense fluid fluid level noted in the left ovary. Right ovary is within normal limits. The uterus is surgically absent. Other: There is large volume ascites. There is no focal abdominal wall hernia or free air. Musculoskeletal: No fracture is seen. Review of the MIP images confirms the above findings. IMPRESSION: 1. No evidence for pulmonary embolism. 2. Mild bibasilar atelectasis. 3. Cirrhotic liver with sequela of portal hypertension including splenomegaly, varices and large volume ascites. 4. Diffuse wall thickening of the ascending, transverse and descending colon worrisome for colitis. 5. 4.8 cm left ovarian hemorrhagic cyst. This can be further evaluated with pelvic ultrasound. 6. Cholelithiasis. Electronically Signed   By: Greig Pique M.D.   On: 10/04/2023 21:47   CT ABDOMEN PELVIS W CONTRAST Result Date: 10/04/2023 CLINICAL DATA:  High probability for PE. Non alcoholic cirrhosis. Chills and weakness. Acute abdominal pain. EXAM: CT ANGIOGRAPHY CHEST CT  ABDOMEN AND PELVIS WITH CONTRAST TECHNIQUE: Multidetector CT imaging of the chest was performed using the standard protocol during bolus administration of intravenous contrast. Multiplanar CT image reconstructions and MIPs were obtained to evaluate the vascular anatomy. Multidetector CT imaging of the abdomen and pelvis was performed using the standard  protocol during bolus administration of intravenous contrast. RADIATION DOSE REDUCTION: This exam was performed according to the departmental dose-optimization program which includes automated exposure control, adjustment of the mA and/or kV according to patient size and/or use of iterative reconstruction technique. CONTRAST:  75mL OMNIPAQUE  IOHEXOL  350 MG/ML SOLN COMPARISON:  CT abdomen and pelvis 06/06/2023 FINDINGS: CTA CHEST FINDINGS Cardiovascular: Satisfactory opacification of the pulmonary arteries to the segmental level. No evidence of pulmonary embolism. Heart is borderline enlarged/mildly enlarged. No pericardial effusion. Mediastinum/Nodes: No enlarged mediastinal, hilar, or axillary lymph nodes. Thyroid  gland, trachea, and esophagus demonstrate no significant findings. Lungs/Pleura: There is mild atelectasis in the bilateral lower lobes and lingula. There is no pleural effusion or pneumothorax. Musculoskeletal: No chest wall abnormality. No acute or significant osseous findings. Review of the MIP images confirms the above findings. CT ABDOMEN and PELVIS FINDINGS Hepatobiliary: There is nodular liver contour, unchanged. Gallstones are present. There is no biliary ductal dilatation. Pancreas: Unremarkable. No pancreatic ductal dilatation or surrounding inflammatory changes. Spleen: Spleen is enlarged measuring up to 18 cm similar to prior. Adrenals/Urinary Tract: There are 2 subcentimeter hypodensities in the left kidney which are too small to characterize, likely cysts. Otherwise, the kidneys, adrenal glands and bladder are within normal limits. Stomach/Bowel: There is diffuse wall thickening of the ascending colon, transverse colon and descending colon. The appendix is within normal limits. No bowel obstruction, pneumatosis or free air visualized. Stomach is within normal limits. Vascular/Lymphatic: Aorta and IVC are normal in size. Splenic varices are present. No enlarged lymph nodes are identified.  Reproductive: 4.8 cm cyst with hyperdense fluid fluid level noted in the left ovary. Right ovary is within normal limits. The uterus is surgically absent. Other: There is large volume ascites. There is no focal abdominal wall hernia or free air. Musculoskeletal: No fracture is seen. Review of the MIP images confirms the above findings. IMPRESSION: 1. No evidence for pulmonary embolism. 2. Mild bibasilar atelectasis. 3. Cirrhotic liver with sequela of portal hypertension including splenomegaly, varices and large volume ascites. 4. Diffuse wall thickening of the ascending, transverse and descending colon worrisome for colitis. 5. 4.8 cm left ovarian hemorrhagic cyst. This can be further evaluated with pelvic ultrasound. 6. Cholelithiasis. Electronically Signed   By: Greig Pique M.D.   On: 10/04/2023 21:47   DG Chest 2 View Result Date: 10/04/2023 CLINICAL DATA:  Possible sepsis EXAM: CHEST - 2 VIEW COMPARISON:  04/14/2022 FINDINGS: Cardiac shadow is within normal limits. Minimal left basilar atelectasis is noted. No bony abnormality is seen. IMPRESSION: Minimal left basilar atelectasis. Electronically Signed   By: Oneil Devonshire M.D.   On: 10/04/2023 20:47   Scheduled Meds:  fidaxomicin  200 mg Oral BID   FLUoxetine   40 mg Oral BID   furosemide   40 mg Intravenous Once   insulin  aspart  0-15 Units Subcutaneous TID WC   insulin  aspart  0-5 Units Subcutaneous QHS   insulin  regular human CONCENTRATED  30 Units Subcutaneous TID WC   lactulose  20 g Oral BID   montelukast   10 mg Oral QHS   pantoprazole   40 mg Oral Daily   sodium chloride  flush  3 mL Intravenous Q12H   sodium chloride  flush  3 mL Intravenous Q12H   Continuous Infusions:  sodium chloride       LOS: 1 day   Alejandro Marker, DO [facility] Available via Epic secure chat 7am-7pm After these hours, please refer to coverage provider listed on [email or web address] 10/05/2023, 7:02 PM

## 2023-10-05 NOTE — Progress Notes (Signed)
 Echocardiogram 2D Echocardiogram has been performed.  Laurie Sutton 10/05/2023, 10:22 AM

## 2023-10-06 ENCOUNTER — Inpatient Hospital Stay (HOSPITAL_COMMUNITY)

## 2023-10-06 ENCOUNTER — Other Ambulatory Visit (HOSPITAL_COMMUNITY): Payer: Self-pay

## 2023-10-06 ENCOUNTER — Telehealth (HOSPITAL_COMMUNITY): Payer: Self-pay | Admitting: Pharmacy Technician

## 2023-10-06 DIAGNOSIS — K7682 Hepatic encephalopathy: Secondary | ICD-10-CM

## 2023-10-06 DIAGNOSIS — G9341 Metabolic encephalopathy: Secondary | ICD-10-CM | POA: Diagnosis not present

## 2023-10-06 DIAGNOSIS — R188 Other ascites: Secondary | ICD-10-CM

## 2023-10-06 DIAGNOSIS — A419 Sepsis, unspecified organism: Secondary | ICD-10-CM

## 2023-10-06 DIAGNOSIS — A498 Other bacterial infections of unspecified site: Secondary | ICD-10-CM

## 2023-10-06 DIAGNOSIS — K529 Noninfective gastroenteritis and colitis, unspecified: Secondary | ICD-10-CM | POA: Diagnosis not present

## 2023-10-06 DIAGNOSIS — R509 Fever, unspecified: Secondary | ICD-10-CM

## 2023-10-06 DIAGNOSIS — A045 Campylobacter enteritis: Secondary | ICD-10-CM

## 2023-10-06 DIAGNOSIS — K7290 Hepatic failure, unspecified without coma: Secondary | ICD-10-CM | POA: Diagnosis not present

## 2023-10-06 DIAGNOSIS — J9601 Acute respiratory failure with hypoxia: Secondary | ICD-10-CM | POA: Diagnosis not present

## 2023-10-06 LAB — CBC WITH DIFFERENTIAL/PLATELET
Abs Immature Granulocytes: 0.04 10*3/uL (ref 0.00–0.07)
Basophils Absolute: 0 10*3/uL (ref 0.0–0.1)
Basophils Relative: 0 %
Eosinophils Absolute: 0 10*3/uL (ref 0.0–0.5)
Eosinophils Relative: 1 %
HCT: 28 % — ABNORMAL LOW (ref 36.0–46.0)
Hemoglobin: 9.8 g/dL — ABNORMAL LOW (ref 12.0–15.0)
Immature Granulocytes: 1 %
Lymphocytes Relative: 11 %
Lymphs Abs: 0.5 10*3/uL — ABNORMAL LOW (ref 0.7–4.0)
MCH: 33.1 pg (ref 26.0–34.0)
MCHC: 35 g/dL (ref 30.0–36.0)
MCV: 94.6 fL (ref 80.0–100.0)
Monocytes Absolute: 0.4 10*3/uL (ref 0.1–1.0)
Monocytes Relative: 10 %
Neutro Abs: 3.6 10*3/uL (ref 1.7–7.7)
Neutrophils Relative %: 77 %
Platelets: 58 10*3/uL — ABNORMAL LOW (ref 150–400)
RBC: 2.96 MIL/uL — ABNORMAL LOW (ref 3.87–5.11)
RDW: 14.6 % (ref 11.5–15.5)
WBC: 4.6 10*3/uL (ref 4.0–10.5)
nRBC: 0 % (ref 0.0–0.2)

## 2023-10-06 LAB — FERRITIN: Ferritin: 108 ng/mL (ref 11–307)

## 2023-10-06 LAB — COMPREHENSIVE METABOLIC PANEL WITH GFR
ALT: 27 U/L (ref 0–44)
AST: 41 U/L (ref 15–41)
Albumin: 2.8 g/dL — ABNORMAL LOW (ref 3.5–5.0)
Alkaline Phosphatase: 89 U/L (ref 38–126)
Anion gap: 5 (ref 5–15)
BUN: 12 mg/dL (ref 6–20)
CO2: 23 mmol/L (ref 22–32)
Calcium: 8.2 mg/dL — ABNORMAL LOW (ref 8.9–10.3)
Chloride: 106 mmol/L (ref 98–111)
Creatinine, Ser: 0.71 mg/dL (ref 0.44–1.00)
GFR, Estimated: >60 mL/min (ref >60)
Glucose, Bld: 266 mg/dL — ABNORMAL HIGH (ref 70–99)
Potassium: 3.1 mmol/L — ABNORMAL LOW (ref 3.5–5.1)
Sodium: 134 mmol/L — ABNORMAL LOW (ref 135–145)
Total Bilirubin: 2.2 mg/dL — ABNORMAL HIGH (ref 0.0–1.2)
Total Protein: 6.6 g/dL (ref 6.5–8.1)

## 2023-10-06 LAB — IRON AND TIBC
Iron: 46 ug/dL (ref 28–170)
Saturation Ratios: 22 % (ref 10.4–31.8)
TIBC: 211 ug/dL — ABNORMAL LOW (ref 250–450)
UIBC: 165 ug/dL

## 2023-10-06 LAB — MAGNESIUM: Magnesium: 1.8 mg/dL (ref 1.7–2.4)

## 2023-10-06 LAB — GLUCOSE, CAPILLARY
Glucose-Capillary: 337 mg/dL — ABNORMAL HIGH (ref 70–99)
Glucose-Capillary: 292 mg/dL — ABNORMAL HIGH (ref 70–99)
Glucose-Capillary: 157 mg/dL — ABNORMAL HIGH (ref 70–99)
Glucose-Capillary: 161 mg/dL — ABNORMAL HIGH (ref 70–99)

## 2023-10-06 LAB — CULTURE, BODY FLUID W GRAM STAIN -BOTTLE: Culture: NO GROWTH

## 2023-10-06 LAB — PHOSPHORUS: Phosphorus: 2.9 mg/dL (ref 2.5–4.6)

## 2023-10-06 LAB — PATHOLOGIST SMEAR REVIEW

## 2023-10-06 MED ORDER — BUTALBITAL-APAP-CAFFEINE 50-325-40 MG PO TABS
1.0000 | ORAL_TABLET | Freq: Three times a day (TID) | ORAL | Status: DC | PRN
  Administered 2023-10-06 – 2023-10-07 (×3): 1 via ORAL
  Filled 2023-10-06 (×3): qty 1

## 2023-10-06 MED ORDER — MAGNESIUM SULFATE 2 GM/50ML IV SOLN
2.0000 g | Freq: Once | INTRAVENOUS | Status: AC
  Administered 2023-10-06: 2 g via INTRAVENOUS
  Filled 2023-10-06: qty 50

## 2023-10-06 MED ORDER — ASPIRIN-ACETAMINOPHEN-CAFFEINE 250-250-65 MG PO TABS: 1.0000 | ORAL_TABLET | Freq: Three times a day (TID) | ORAL | Status: DC | PRN

## 2023-10-06 MED ORDER — INSULIN REGULAR HUMAN (CONC) 500 UNIT/ML ~~LOC~~ SOPN
40.0000 [IU] | PEN_INJECTOR | Freq: Three times a day (TID) | SUBCUTANEOUS | Status: DC
  Administered 2023-10-07 (×2): 40 [IU] via SUBCUTANEOUS

## 2023-10-06 MED ORDER — POTASSIUM CHLORIDE CRYS ER 20 MEQ PO TBCR
40.0000 meq | EXTENDED_RELEASE_TABLET | Freq: Two times a day (BID) | ORAL | Status: AC
  Administered 2023-10-06 (×2): 40 meq via ORAL
  Filled 2023-10-06 (×2): qty 2

## 2023-10-06 NOTE — Plan of Care (Signed)
  Problem: Education: Goal: Individualized Educational Video(s) Outcome: Progressing   Problem: Coping: Goal: Ability to adjust to condition or change in health will improve Outcome: Progressing   Problem: Fluid Volume: Goal: Ability to maintain a balanced intake and output will improve Outcome: Progressing   Problem: Health Behavior/Discharge Planning: Goal: Ability to identify and utilize available resources and services will improve Outcome: Progressing Goal: Ability to manage health-related needs will improve Outcome: Progressing   Problem: Pain Managment: Goal: General experience of comfort will improve and/or be controlled Outcome: Not Progressing   Headache today and prn meds given and addressed with MD

## 2023-10-06 NOTE — TOC CM/SW Note (Signed)
 Transition of Care [facility]) - Inpatient Brief Assessment   Patient Details  Name: Laurie Sutton MRN: [id number] Date of Birth: October 26, 1980  Transition of Care [facility]) CM/SW Contact:    Tom-Johnson, Harvest Muskrat, RN Phone Number: 10/06/2023, 3:11 PM   Clinical Narrative:  Patient presented to the Ed with Generalized Weakness, Fever, Chills, Shortness of Breath, Cough, Dysuria, and Abdominal Discomfort. Patient recently undergo Tooth Extraction and Plantar Fasciitis sx. Has hx of  Nash Cirrhosis, DMT2, Obesity, Esophageal Varices, Portal Hypertensive Gastropathy, frequent UTI's, Depression Cardiac Murmur and GERD. Admitted with Decompensated Hepatic Cirrhosis, GI following. Underwent Paracentesis yesterday 10/06/23 by IR. Psych following for Depression.   From home with husband her dad and her two children. Patient states she lost her mother who was her best friend last November. Independent with care and drive self prior to admit. Employed. Has access to a cane, walker, w/c and shower seat.  PCP is [name], MD and uses [facility] on [address]   No TOC needs or recommendations noted at this time.  Patient not Medically ready for discharge.  CM will continue to follow as patient progresses with care towards discharge.         Transition of Care Asessment: Insurance and Status: Insurance coverage has been reviewed Patient has primary care physician: Yes Home environment has been reviewed: Yes Prior level of function:: Independent Prior/Current Home Services: No current home services Social Drivers of Health Review: SDOH reviewed no interventions necessary Readmission risk has been reviewed: Yes Transition of care needs: no transition of care needs at this time

## 2023-10-06 NOTE — Consult Note (Signed)
 Brief Consult Note:  Psychiatry consult service was consulted with concern from husband for worsening depression and low energy over the past two weeks. On interview, patient was surprised to hear from the psychiatric team and denied any changes in her depression. Noted that she had been sadder since her mother passed last November, and that she was continuing to grieve this loss. Denied low mood and anhedonia over the past two weeks. Has been seeing a therapist but not a psychiatrist -- her PCP handles her current regimen of Prozac  40 mg BID for depressed mood. Denies suicidal ideation, both present and past. Has reasons for living including two teenage children. Denies homicidal ideation. Denies auditory and visual hallucinations. Is not interested in changes to her psychiatric medication regimen. No history of psychiatric hospitalizations. Reports consistency with her medication regimen. No bipolar history of history of psychosis. No previous psychiatric hospitalization.   Gave explicit verbal permission to reach out to husband to discuss patient's psychiatric illness and care.   On interview with husband, Samariyah Cowles [phone]):   Patient has not been suicidal or anything like that. Patient is down on herself all of the time. Has been having a hard time dealing with her mother passing, on November 21st. (Of note, today is her mom's birthday.) Was very close with her mother -- together every single day, meant everything to her. Was unprepared for her mother's death. Is less active than before. Doesn't go out and do much. Says that she's tired all of the time and sleeps a lot. Patient texted husband after psychiatry visited her and was pissed that the psychiatric consult service came to see her. Husband says that she's very different than two years ago. Stays in bed a lot.  Didn't have the opportunity to say goodbye. Another major stressor is her ongoing health issues. Was given cirrhosis  diagnosis just before her mother passed. Depression worsened after consult with transplant in March. No discussion of suicide or suicide attempt. No hallucinations. Anxiety since childhood. Has been on a lot of anxiety medicines. Does not have access to firearms. Not been taking her medicine like she's supposed to. Saw a therapist last Thursday. Supposed to go once a week, but can't because she's in the hospital. Very bull-headed and stubborn. I feel like my wife is going to be dead in the next year or two. Stopped taking her medicine -- may or may not be related to depression.   Assessment/Plan:  While husband's story is concerning for worsening depression, patient is denying depressed mood and is not interested in starting new medication for depression at this time. Patient is denying suicidal and homicidal ideation at this time and does not meet criteria for involuntary commitment. She recently started seeing a therapist last week. There is no acute indication to change patients psychiatric medication regimen at this time in absence of suicidality, homicidality or psychosis.   #MDD vs bereavement vs adjustment disorder #GAD - Continue fluoxetine  40 mg BID for depressed mood.  - Recommend TOC consult for outpatient psychiatric medication management in setting of reported worsening depressive symptoms.  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 - Psychiatry will sign off at this time. Should patient become more interested in discussing her psychiatric health and medication management, or if any urgent/emergent psychiatric indications arise, please re-consult.

## 2023-10-06 NOTE — Progress Notes (Signed)
 Daily Progress Note  DOA: 10/04/2023 Hospital Day: 3   Cc: Decompensated cirrhosis / chills /suspected C. difficile infection /cephalofactor infection    ASSESSMENT    43 year old female with decompensated MASH cirrhosis with ascites, esophageal varices, splenomegaly and probable hepatic encephalopathy.  MELD 3.0: 22 at 10/05/2023  4:59 AM No improvement in INR but just started oral vitamin K. No active bleeding.  INR 1.8  Chills / diffuse colitis on CT scan.  Interestingly, despite not having diarrhea at home ( other than lactulose related) he stool is positive for Campylobacter.  Additionally C. difficile infection possible (  positive antigen, positive PCR but negative toxin).   SHOB, resolved Possibly related to large volume ascites .  Chest CTA negative for PE but did show bibasilar atelectasis. Respiratory panel negative.    Chronic pancytopenia Stable   4.8 left ovarian hemorrhagic cyst on CT scan   DM2   Principal Problem:   Decompensated hepatic cirrhosis (HCC) Active Problems:   Depression with anxiety   Liver cirrhosis secondary to NASH (HCC)   Non-insulin  dependent type 2 diabetes mellitus (HCC)   Acute metabolic encephalopathy   Sepsis (HCC)   Colitis   Acute hypoxic respiratory failure (HCC)   Mild bibasilar atelectasis   Chronic idiopathic thrombocytopenia (HCC)   Peripheral neuropathy   SIRS (systemic inflammatory response syndrome) (HCC)   Other ascites   Fever   PLAN   Complete course of Dificid Home diuretics resumed today Continue twice daily lactulose and titrate to 3 BMs daily.  Am labs  Subjective   Feels better compared to yesterday.  Tolerating diet.  No abdominal pain.   Objective   GI Studies:    Recent Labs    10/04/23 2002 10/04/23 2208 10/05/23 0459 10/06/23 0552  WBC 4.4  --  2.9* 4.6  HGB 10.7* 9.2* 9.7* 9.8*  HCT 31.3* 27.0* 28.7* 28.0*  MCV 93.4  --  95.7 94.6  PLT 69*  --  49* 58*   Recent Labs     10/06/23 0552  FERRITIN 108  TIBC 211*  IRONPCTSAT 22   Recent Labs    10/04/23 2002 10/04/23 2208 10/05/23 0459 10/06/23 0552  NA 135 137 132* 134*  K 4.0 3.9 4.4 3.1*  CL 107  --  106 106  CO2 20*  --  20* 23  GLUCOSE 213*  --  420* 266*  BUN 11  --  12 12  CREATININE 0.89  --  1.00 0.71  CALCIUM 8.2*  --  8.3* 8.2*   Recent Labs    10/04/23 2002 10/05/23 0459 10/06/23 0552  PROT 6.4* 6.8 6.6  ALBUMIN 2.4* 3.1* 2.8*  AST 58* 54* 41  ALT 31 30 27   ALKPHOS 85 76 89  BILITOT 2.7* 3.6* 2.2*  BILIDIR 0.8*  --   --       Imaging:  DG CHEST PORT 1 VIEW CLINICAL DATA:  [id number] SOB (shortness of breath)  EXAM: PORTABLE CHEST 1 VIEW  COMPARISON:  Chest radiograph 10/04/2023 and chest CTA 10/04/2023  FINDINGS: Both lungs are clear. No focal airspace disease or lung consolidation. Heart and mediastinum are within normal limits. Trachea is midline. Patient is slightly rotated towards the left. Negative for a pneumothorax.  IMPRESSION: No acute cardiopulmonary disease.  Electronically Signed   By: Juliene Balder M.D.   On: 10/06/2023 09:10     Scheduled inpatient medications:   fidaxomicin  200 mg Oral BID   FLUoxetine   40 mg  Oral BID   furosemide   60 mg Oral Daily   insulin  aspart  0-15 Units Subcutaneous TID WC   insulin  aspart  0-5 Units Subcutaneous QHS   insulin  regular human CONCENTRATED  30 Units Subcutaneous TID WC   lactulose  20 g Oral BID   montelukast   10 mg Oral QHS   pantoprazole   40 mg Oral Daily   phytonadione  10 mg Oral Daily   potassium chloride  40 mEq Oral BID   sodium chloride  flush  3 mL Intravenous Q12H   sodium chloride  flush  3 mL Intravenous Q12H   spironolactone  150 mg Oral Daily   Continuous inpatient infusions:  PRN inpatient medications: acetaminophen  **OR** acetaminophen , albuterol , butalbital-acetaminophen -caffeine, HYDROcodone -acetaminophen , ondansetron  **OR** ondansetron  (ZOFRAN ) IV, sodium chloride  flush  Vital signs  in last 24 hours: Temp:  [97.9 F (36.6 C)-98.8 F (37.1 C)] 97.9 F (36.6 C) (06/26 0821) Pulse Rate:  [64-78] 72 (06/26 1252) Resp:  [18] 18 (06/26 0821) BP: (97-116)/(47-59) 97/54 (06/26 1252) SpO2:  [95 %-100 %] 95 % (06/26 0821) Last BM Date : 10/05/23  Intake/Output Summary (Last 24 hours) at 10/06/2023 1341 Last data filed at 10/06/2023 0830 Gross per 24 hour  Intake 1160 ml  Output 0 ml  Net 1160 ml    Intake/Output from previous day: 06/25 0701 - 06/26 0700 In: 1200 [P.O.:1200] Out: 0  Intake/Output this shift: Total I/O In: 320 [P.O.:320] Out: -    Physical Exam:  General: Alert female in NAD Heart:  Regular rate and rhythm.  Pulmonary: Normal respiratory effort Abdomen: Soft, nodistended, nontender. Normal bowel sounds. Extremities: No lower extremity edema  Neurologic: Alert and oriented Psych: Pleasant. Cooperative     LOS: 2 days   [name] ,NP 10/06/2023, 1:41 PM

## 2023-10-06 NOTE — Inpatient Diabetes Management (Signed)
 Inpatient Diabetes Program Recommendations  AACE/ADA: New Consensus Statement on Inpatient Glycemic Control (2015)  Target Ranges:  Prepandial:   less than 140 mg/dL      Peak postprandial:   less than 180 mg/dL (1-2 hours)      Critically ill patients:  140 - 180 mg/dL   Lab Results  Component Value Date   GLUCAP 292 (H) 10/06/2023   HGBA1C 6.1 (H) 10/05/2023    Review of Glycemic Control  Latest Reference Range & Units 10/05/23 07:55 10/05/23 11:19 10/05/23 16:24 10/05/23 21:38 10/06/23 08:21 10/06/23 12:30  Glucose-Capillary 70 - 99 mg/dL 568 (H) 646 (H) 615 (H) 332 (H) 337 (H) 292 (H)   Diabetes history: type 2 Outpatient Diabetes medications: Humulin  U-500 insulin  35-45 units TID per meal, Mounjaro  7.5 mg week Current orders for Inpatient glycemic control:  Humulin  U-500 30 units tid Novolog  0-20 units TID + HS   Inpatient Diabetes Program Recommendations:   Spoke with patient at the bedside. Patient states that she takes Humulin  U-500 insulin  35-45 units TID with meals per sliding scale. She does not eat much per meal due to Mounjaro .  Recommend: Increase Humulin  U-500 40 units TID with meals (starting with dinner tonight)   Thanks, Clotilda Bull RN, MSN, BC-ADM Inpatient Diabetes Coordinator Team Pager [phone] (8a-5p)

## 2023-10-06 NOTE — Telephone Encounter (Signed)
 Patient Product/process development scientist completed.    The patient is insured through [employer]. Patient has [employer], may use a copay card, and/or apply for patient assistance if available.    Ran test claim for Dificid 200 mg and the current 10 day co-pay is $19.03.   This test claim was processed through [facility]- copay amounts may vary at other pharmacies due to pharmacy/plan contracts, or as the patient moves through the different stages of their insurance plan.     Reyes Sharps, CPHT Pharmacy Technician III Certified Patient Advocate [facility] Patient Advocate Team Direct Number: [phone]  Fax: [phone]

## 2023-10-06 NOTE — Progress Notes (Signed)
 PROGRESS NOTE    Laurie Sutton  FMW:[id number] DOB: [birth year] DOA: 10/04/2023 PCP: [name], MD   Brief Narrative:  The patient is a 43 year old obese chronically ill-appearing Caucasian female with past medical history significant for but not limited to [place] cirrhosis, history of esophageal varices, chronic, cytopenia, frequent UTIs, depression and anxiety, diabetes mellitus type 2, GERD, history of menorrhagia, peripheral neuropathy and other comorbidities including recent dental extraction who presented with shortness of breath to the ED.  She had been noncompliant with her diuretics and had a tooth extraction and has been taking antibiotics after tooth extraction.  Further workup was done and she is found to be febrile, hypotensive and tachypneic with requirement of supplemental oxygen.  She is found to have decompensated hepatic cirrhosis and acute diastolic CHF.  Subsequently she underwent a paracentesis and GI was consulted.  Husband notes that she has been notably more depressed to the point where she lays in bed most of the time so Psychiatry evaluated and recommending outpatient follow-up with continuation of her current medications.  Further studies show that she is positive for Campylobacter and C. difficile so she will just be continued on Pradaxa mycin for now.  Her home diuretics have been resumed.  Will do an amatory home O2 screen prior to discharge  Assessment and Plan:  Decompensated Hepatic Cirrhosis / Hyperbilirubinemia Acute metabolic encephalopathy in the setting of hyperammonemia History of portal hypertensive gastropathy History of esophageal varices -Patient presented emergency department complaining of increased confusion, shortness of breath, cough, dysuria, generalized weakness, abdominal discomfort and poor appetite.  At presentation to ED patient found hypotensive, and low-grade fever.  O2 sat dropped to 88% has been placed on nasal cannula oxygen.  Patient  reported she has not been taking Lasix  and spironolactone for almost 1 week. -CT abdomen pelvis showing no evidence of PE, mild bibasilar atelectasis, cirrhotic liver with sequela of portal hypertension including splenomegaly, varices and large volume ascites.  Notably there is also diffuse wall thickening of the ascending, transverse and descending colon which was worrisome for colitis -CMP showing elevated AST and elevated bilirubin.  Elevated ammonia 89 on admission. - Decompensated hepatic cirrhosis with ascites in the setting of medication noncompliance. - Given patient is hypotensive holding Lasix  and is bilateral at this time. - Consulting IR for paracentesis and will send paracentesis fluid study. -Was given albumin 50 g and midodrine 10 mg yesterday in the setting of hypotension - Starting lactulose 30 g 3 times daily was reduced to 20 g twice daily.  Goal to have at least 2-3 bowel bowel movements in a day. -Holding propranolol  in the setting of low blood pressure. -GI consulted for further evaluation.  Her MELD 3.0 score was 22 and she has no evidence of SBP.  GI is recommending 2 g sodium restricted salt diet; her home diuretics with Lasix  60 mg p.o. daily and spironolactone 150 mg p.o. daily were resumed this morning  Sepsis secondary to colitis and suspect this is related to the C. Difficile and Campylobacter  -Patient meets sepsis criteria in the setting of tachycardia, low-grade fever and hypotension.  CT abdomen pelvis showed evidence of colitis as above CTA chest ruled out PE and pneumonia however it showed bibasilar atelectasis. - Blood cultures are pending.  Lactic acid within normal range. - History of penicillin allergy.  Pharmacy recommended change Zosyn to cefepime/metronidazole but will discontinue this and transition to Dificid given that her C. difficile antigen was positive and her toxigenic C.  difficile via PCR was also positive -GI pathogen panel positive for  Campylobacter -After giving vancomycin patient started having rash of the forehead, upper chest wall with associated itchiness.  Patient also seemed flushed with redness of the cheeks.  No evidence of new respiratory distress, stridor.   There was concern for development of allergic reaction or early development of red man syndrome in setting of vancomycin use. -Giving IV Benadryl  and Solu-Medrol .  DC vancomycin completely. -Continue to monitor for any temperatures and follow WBC curve   Left ovarian hemorrhagic cyst: Noted to be 4.8 cm and can be further evaluated with a pelvic ultrasound but will defer for now   Acute hypoxic respiratory failure in the setting of bibasilar atelectasis and volume overload from acute on chronic diastolic dysfunction: Respiratory Virus Panel Negative as well as COVID.  Chest x-ray and CT chest showed bilateral atelectasis.  In the ED patient O2 sat dropped below 88% placed on nasal cannula oxygen. -Patient being volume overloaded with ascites in the setting of medication noncompliance.  Due to low blood pressure was unable to start Lasix  and spironolactone at this time.  Giving albumin and plan for paracentesis.  Paracentesis done and removed 3 L. Given dose of IV Lasix  40 mg 6/25. Resumed Home Diuretics now CTM for S/Sx of Volume overload -Continue supportive care, supplemental oxygen, spirometry, flutter valve and wean down oxygen to room air as patient tolerates. Will need Ambulatory Home O2 Screen and repeat CXR in the AM.   Recent Dental Extraction:  Reported recent dental extraction have not received any prophylactic antibiotic.  Physical exam showed murmur.  Echocardiogram to rule out vegetation and this is done and showed no Vegetation but did show G2DD  Hypokalemia: Mild. K+ was 3.1. Replete w/ po KCL 40 mEQ BID x2. CTM and Replete as Necessary. Repeat CMP in the AM    Depression / Generalized Anxiety Disorder: C/w Fluoxetine  40 mg po BID. husband believes  that she is severely depressed and has not been engaging in very much activities has been laying in bed most of the time.  Psychiatry has been consulted for evaluation and management given that the patient's husband states that this is causing structure between her marriage.  Psychiatry consulted patient did endorse some mood changes but states that she is not as depressed and denied any SI/HI/AVH.  Psychiatry recommended continuing full duloxetine 40 mg twice daily and outpatient psychiatric services and follow-up  Reactive airway disease: Continue Albuterol  2.5 mg Neb q6hprn Wheezing and SOB and Montelukast  10 mg qHS daily.  Insulin  Dependent Diabetes Mellitus Type 2: HbA1c was 6.1. Received IV Solumedrol yesterday and now CBGs elevated. C/w Moderate Novolog  SS[facility] AC/HS and Diabetes Education Coordinator consulted and recommending Humulin  U-500 30 Units TID w/ Meals but given her continued hyperglycemia will increase to 40 U. CTM CBGs per Protocol. CBGs ranging from 157-337  Pancytopenia: Improved, in the setting of NASH Cirrhosis and infection from above. Has Chronic Thrombocytopenia 2/2 to Cirrhosis. CBC Trend: Recent Labs  Lab 10/04/23 2002 10/04/23 2208 10/05/23 0459 10/06/23 0552  WBC 4.4  --  2.9* 4.6  HGB 10.7*   < > 9.7* 9.8*  HCT 31.3*   < > 28.7* 28.0*  MCV 93.4  --  95.7 94.6  PLT 69*  --  49* 58*   < > = values in this interval not displayed.  -Checked Anemia Panel and showed an iron level of 46, UIBC 165, TIBC of 211, saturation ratios of 22%, ferritin  level of 108. CTM for S/Sx of Bleeding; no overt bleeding noted. CTM and Trend and repeat CMP in the AM  GERD/GI Prophylaxis: C/w Pantoprazole  40 mg po daily   Hypoalbuminemia: Patient's Albumin Level went from 2.4 -> 3.1 -> 2.8. CTM and Trend and repeat CMP in the AM  Class II Obesity -Complicates overall prognosis and care -Estimated body mass index is 35.51 kg/m as calculated from the following:   Height as of this  encounter: 5' 6 (1.676 m).   Weight as of this encounter: 99.8 kg.  -Weight Loss and Dietary Counseling given   DVT prophylaxis: SCDs Start: 10/04/23 2259 Place TED hose Start: 10/04/23 2259    Code Status: Full Code Family Communication: No family present @ bedside  Disposition Plan:  Level of care: Telemetry Medical Status is: Inpatient Remains inpatient appropriate because: Further clinical improvement and clearance and anticipating discharge in the next 24 hours   Consultants:  Gastroenterology Psychiatry  Procedures:  As delineated as above  Antimicrobials:  Anti-infectives (From admission, onward)    Start     Dose/Rate Route Frequency Ordered Stop   10/05/23 2200  fidaxomicin (DIFICID) tablet 200 mg        200 mg Oral 2 times daily 10/05/23 1511 10/15/23 2159   10/05/23 1000  vancomycin (VANCOREADY) IVPB 750 mg/150 mL  Status:  Discontinued        750 mg 75 mL/hr over 120 Minutes Intravenous Every 12 hours 10/04/23 2313 10/04/23 2353   10/05/23 0400  ceFEPIme (MAXIPIME) 2 g in sodium chloride  0.9 % 100 mL IVPB  Status:  Discontinued        2 g 200 mL/hr over 30 Minutes Intravenous Every 8 hours 10/04/23 2313 10/05/23 1512   10/05/23 0400  metroNIDAZOLE (FLAGYL) IVPB 500 mg  Status:  Discontinued        500 mg 100 mL/hr over 60 Minutes Intravenous Every 12 hours 10/04/23 2313 10/05/23 1511   10/04/23 2100  vancomycin (VANCOREADY) IVPB 2000 mg/400 mL        2,000 mg 200 mL/hr over 120 Minutes Intravenous  Once 10/04/23 2055 10/04/23 2334   10/04/23 2045  cefTRIAXone (ROCEPHIN) 2 g in sodium chloride  0.9 % 100 mL IVPB        2 g 200 mL/hr over 30 Minutes Intravenous  Once 10/04/23 2041 10/04/23 2107       Subjective: Seen and examined at bedside was doing okay.  Denied any nausea or vomiting.  Abdomen was less distended.  Feels okay but states that her IV was burning.  No other concerns or complaints at this time.  Objective: Vitals:   10/06/23 0540 10/06/23  0821 10/06/23 1252 10/06/23 1701  BP: (!) 98/47 (!) 97/51 (!) 97/54 (!) 90/49  Pulse: 64 71 72 66  Resp: 18 18  14   Temp: 98.8 F (37.1 C) 97.9 F (36.6 C)  97.7 F (36.5 C)  TempSrc:  Oral    SpO2: 100% 95%  99%  Weight:      Height:        Intake/Output Summary (Last 24 hours) at 10/06/2023 1914 Last data filed at 10/06/2023 1819 Gross per 24 hour  Intake 1220 ml  Output --  Net 1220 ml   Filed Weights   10/04/23 1940  Weight: 99.8 kg   Examination: Physical Exam:  Constitutional: WN/WD obese Caucasian female in no acute distress Respiratory: Diminished to auscultation bilaterally with some coarse breath sounds, no wheezing, rales, rhonchi or crackles. Normal  respiratory effort and patient is not tachypenic. No accessory muscle use.  Unlabored breathing and not wearing supplemental oxygen nasal cannula Cardiovascular: RRR, no murmurs / rubs / gallops. S1 and S2 auscultated.  Is 1+ extremity edema Abdomen: Soft, non-tender, distended secondary to body habitus and also because of ascites.  Bowel sounds positive.  GU: Deferred. Musculoskeletal: No clubbing / cyanosis of digits/nails. No joint deformity upper and lower extremities. Skin: No rashes, lesions, ulcers on a limited skin evaluation. No induration; Warm and dry.  Neurologic: CN 2-12 grossly intact with no focal deficits. Romberg sign and cerebellar reflexes not assessed.  Psychiatric: Normal judgment and insight. Alert and oriented x 3.   Data Reviewed: I have personally reviewed following labs and imaging studies  CBC: Recent Labs  Lab 10/04/23 2002 10/04/23 2208 10/05/23 0459 10/06/23 0552  WBC 4.4  --  2.9* 4.6  NEUTROABS 3.5  --   --  3.6  HGB 10.7* 9.2* 9.7* 9.8*  HCT 31.3* 27.0* 28.7* 28.0*  MCV 93.4  --  95.7 94.6  PLT 69*  --  49* 58*   Basic Metabolic Panel: Recent Labs  Lab 10/04/23 2002 10/04/23 2208 10/05/23 0459 10/06/23 0552  NA 135 137 132* 134*  K 4.0 3.9 4.4 3.1*  CL 107  --  106  106  CO2 20*  --  20* 23  GLUCOSE 213*  --  420* 266*  BUN 11  --  12 12  CREATININE 0.89  --  1.00 0.71  CALCIUM 8.2*  --  8.3* 8.2*  MG  --   --   --  1.8  PHOS  --   --   --  2.9   GFR: Estimated Creatinine Clearance: 109.2 mL/min (by C-G formula based on SCr of 0.71 mg/dL). Liver Function Tests: Recent Labs  Lab 10/04/23 2002 10/05/23 0459 10/06/23 0552  AST 58* 54* 41  ALT 31 30 27   ALKPHOS 85 76 89  BILITOT 2.7* 3.6* 2.2*  PROT 6.4* 6.8 6.6  ALBUMIN 2.4* 3.1* 2.8*   No results for input(s): LIPASE, AMYLASE in the last 168 hours. Recent Labs  Lab 10/04/23 2055  AMMONIA 89*   Coagulation Profile: Recent Labs  Lab 10/04/23 2002 10/05/23 0459  INR 1.5* 1.8*   Cardiac Enzymes: No results for input(s): CKTOTAL, CKMB, CKMBINDEX, TROPONINI in the last 168 hours. BNP (last 3 results) No results for input(s): PROBNP in the last 8760 hours. HbA1C: Recent Labs    10/05/23 0459  HGBA1C 6.1*   CBG: Recent Labs  Lab 10/05/23 1624 10/05/23 2138 10/06/23 0821 10/06/23 1230 10/06/23 1703  GLUCAP 384* 332* 337* 292* 157*   Lipid Profile: No results for input(s): CHOL, HDL, LDLCALC, TRIG, CHOLHDL, LDLDIRECT in the last 72 hours. Thyroid  Function Tests: No results for input(s): TSH, T4TOTAL, FREET4, T3FREE, THYROIDAB in the last 72 hours. Anemia Panel: Recent Labs    10/06/23 0552  FERRITIN 108  TIBC 211*  IRON 46   Sepsis Labs: Recent Labs  Lab 10/04/23 2010  LATICACIDVEN 1.7   Recent Results (from the past 240 hours)  Culture, blood (Routine x 2)     Status: None (Preliminary result)   Collection Time: 10/04/23  7:50 PM   Specimen: BLOOD LEFT ARM  Result Value Ref Range Status   Specimen Description BLOOD LEFT ARM  Final   Special Requests   Final    BOTTLES DRAWN AEROBIC AND ANAEROBIC Blood Culture adequate volume   Culture   Final  NO GROWTH 2 DAYS Performed at [facility], [address]  [place], [place] [address]    Report Status PENDING  Incomplete  Resp panel by RT-PCR (RSV, Flu A&B, Covid) Anterior Nasal Swab     Status: None   Collection Time: 10/04/23  8:50 PM   Specimen: Anterior Nasal Swab  Result Value Ref Range Status   SARS Coronavirus 2 by RT PCR NEGATIVE NEGATIVE Final   Influenza A by PCR NEGATIVE NEGATIVE Final   Influenza B by PCR NEGATIVE NEGATIVE Final    Comment: (NOTE) The Xpert Xpress SARS-CoV-2/FLU/RSV plus assay is intended as an aid in the diagnosis of influenza from Nasopharyngeal swab specimens and should not be used as a sole basis for treatment. Nasal washings and aspirates are unacceptable for Xpert Xpress SARS-CoV-2/FLU/RSV testing.  Fact Sheet for Patients: BloggerCourse.com  Fact Sheet for Healthcare Providers: SeriousBroker.it  This test is not yet approved or cleared by the [place]  FDA and has been authorized for detection and/or diagnosis of SARS-CoV-2 by FDA under an Emergency Use Authorization (EUA). This EUA will remain in effect (meaning this test can be used) for the duration of the COVID-19 declaration under Section 564(b)(1) of the Act, 21 U.S.C. section 360bbb-3(b)(1), unless the authorization is terminated or revoked.     Resp Syncytial Virus by PCR NEGATIVE NEGATIVE Final    Comment: (NOTE) Fact Sheet for Patients: BloggerCourse.com  Fact Sheet for Healthcare Providers: SeriousBroker.it  This test is not yet approved or cleared by the [place]  FDA and has been authorized for detection and/or diagnosis of SARS-CoV-2 by FDA under an Emergency Use Authorization (EUA). This EUA will remain in effect (meaning this test can be used) for the duration of the COVID-19 declaration under Section 564(b)(1) of the Act, 21 U.S.C. section 360bbb-3(b)(1), unless the authorization is terminated or revoked.  Performed at  [facility], [address] [place], [place] [address]   Respiratory (~20 pathogens) panel by PCR     Status: None   Collection Time: 10/04/23  8:50 PM   Specimen: Nasopharyngeal Swab; Respiratory  Result Value Ref Range Status   Adenovirus NOT DETECTED NOT DETECTED Final   Coronavirus 229E NOT DETECTED NOT DETECTED Final    Comment: (NOTE) The Coronavirus on the Respiratory Panel, DOES NOT test for the novel  Coronavirus (2019 nCoV)    Coronavirus HKU1 NOT DETECTED NOT DETECTED Final   Coronavirus NL63 NOT DETECTED NOT DETECTED Final   Coronavirus OC43 NOT DETECTED NOT DETECTED Final   Metapneumovirus NOT DETECTED NOT DETECTED Final   Rhinovirus / Enterovirus NOT DETECTED NOT DETECTED Final   Influenza A NOT DETECTED NOT DETECTED Final   Influenza B NOT DETECTED NOT DETECTED Final   Parainfluenza Virus 1 NOT DETECTED NOT DETECTED Final   Parainfluenza Virus 2 NOT DETECTED NOT DETECTED Final   Parainfluenza Virus 3 NOT DETECTED NOT DETECTED Final   Parainfluenza Virus 4 NOT DETECTED NOT DETECTED Final   Respiratory Syncytial Virus NOT DETECTED NOT DETECTED Final   Bordetella pertussis NOT DETECTED NOT DETECTED Final   Bordetella Parapertussis NOT DETECTED NOT DETECTED Final   Chlamydophila pneumoniae NOT DETECTED NOT DETECTED Final   Mycoplasma pneumoniae NOT DETECTED NOT DETECTED Final    Comment: Performed at [facility], [address], [place], [place] [address]  Gastrointestinal Panel by PCR , Stool     Status: Abnormal   Collection Time: 10/04/23 11:04 PM   Specimen: Stool  Result Value Ref  Range Status   Campylobacter species DETECTED (A) NOT DETECTED Final    Comment: RESULT CALLED TO, READ BACK BY AND VERIFIED WITH:  JOYCE ATAMBILE AT 2354 10/05/23 JG    Plesimonas shigelloides NOT DETECTED NOT DETECTED Final   Salmonella species NOT DETECTED NOT DETECTED Final   Yersinia enterocolitica NOT DETECTED NOT DETECTED Final   Vibrio species NOT DETECTED NOT  DETECTED Final   Vibrio cholerae NOT DETECTED NOT DETECTED Final   Enteroaggregative E coli (EAEC) NOT DETECTED NOT DETECTED Final   Enteropathogenic E coli (EPEC) NOT DETECTED NOT DETECTED Final   Enterotoxigenic E coli (ETEC) NOT DETECTED NOT DETECTED Final   Shiga like toxin producing E coli (STEC) NOT DETECTED NOT DETECTED Final   Shigella/Enteroinvasive E coli (EIEC) NOT DETECTED NOT DETECTED Final   Cryptosporidium NOT DETECTED NOT DETECTED Final   Cyclospora cayetanensis NOT DETECTED NOT DETECTED Final   Entamoeba histolytica NOT DETECTED NOT DETECTED Final   Giardia lamblia NOT DETECTED NOT DETECTED Final   Adenovirus F40/41 NOT DETECTED NOT DETECTED Final   Astrovirus NOT DETECTED NOT DETECTED Final   Norovirus GI/GII NOT DETECTED NOT DETECTED Final   Rotavirus A NOT DETECTED NOT DETECTED Final   Sapovirus (I, II, IV, and V) NOT DETECTED NOT DETECTED Final    Comment: Performed at [facility], [address] [place], [place] [address]  C Difficile Quick Screen w PCR reflex     Status: Abnormal   Collection Time: 10/04/23 11:04 PM   Specimen: STOOL  Result Value Ref Range Status   C Diff antigen POSITIVE (A) NEGATIVE Final   C Diff toxin NEGATIVE NEGATIVE Final   C Diff interpretation Results are indeterminate. See PCR results.  Final    Comment: Performed at [facility], [address], [place], [place] [address]  C. Diff by PCR, Reflexed     Status: Abnormal   Collection Time: 10/04/23 11:04 PM  Result Value Ref Range Status   Toxigenic C. Difficile by PCR POSITIVE (A) NEGATIVE Final    Comment: Positive for toxigenic C. difficile with little to no toxin production. Only treat if clinical presentation suggests symptomatic illness. Performed at [facility], [address], [place], [place] [address]   Culture, body fluid w Gram Stain-bottle     Status: None (Preliminary result)   Collection Time: 10/05/23  1:00 PM   Specimen: Path fluid  Result  Value Ref Range Status   Specimen Description FLUID  Final   Special Requests NONE  Final   Culture   Final    NO GROWTH < 24 HOURS Performed at [facility], [address] [place], [place] [address]    Report Status PENDING  Incomplete  Gram stain     Status: None   Collection Time: 10/05/23  1:13 PM   Specimen: Abdomen; Peritoneal Fluid  Result Value Ref Range Status   Specimen Description ABDOMEN  Final   Special Requests NONE  Final   Gram Stain   Final    NO WBC SEEN NO ORGANISMS SEEN Performed at [facility], [address] [place], [place] [address]    Report Status 10/05/2023 FINAL  Final    Radiology Studies: DG CHEST PORT 1 VIEW Result Date: 10/06/2023 CLINICAL DATA:  [id number] SOB (shortness of breath) EXAM: PORTABLE CHEST 1 VIEW COMPARISON:  Chest radiograph 10/04/2023 and chest CTA 10/04/2023 FINDINGS: Both lungs are clear. No focal airspace disease or lung consolidation. Heart and  mediastinum are within normal limits. Trachea is midline. Patient is slightly rotated towards the left. Negative for a pneumothorax. IMPRESSION: No acute cardiopulmonary disease. Electronically Signed   By: Juliene Balder M.D.   On: 10/06/2023 09:10   ECHOCARDIOGRAM COMPLETE Result Date: 10/05/2023    ECHOCARDIOGRAM REPORT   Patient Name:   Laurie Sutton Date of Exam: 10/05/2023 Medical Rec #:  [id number]         Height:       66.0 in Accession #:    [id number]        Weight:       220.0 lb Date of Birth:  [birth year]        BSA:          2.082 m Patient Age:    42 years          BP:           106/54 mmHg Patient Gender: F                 HR:           71 bpm. Exam Location:  Inpatient Procedure: 2D Echo, Cardiac Doppler and Color Doppler (Both Spectral and Color            Flow Doppler were utilized during procedure). Indications:    Murmor R01.1  History:        Patient has no prior history of Echocardiogram examinations.                 Migraines; Risk Factors:Diabetes.  Sonographer:     Thea Norlander RCS Referring Phys: SUBRINA SUNDIL IMPRESSIONS  1. Left ventricular ejection fraction, by estimation, is 60 to 65%. The left ventricle has normal function. The left ventricle has no regional wall motion abnormalities. Left ventricular diastolic parameters are consistent with Grade II diastolic dysfunction (pseudonormalization).  2. Right ventricular systolic function is normal. The right ventricular size is normal. There is normal pulmonary artery systolic pressure.  3. Left atrial size was moderately dilated.  4. The mitral valve is normal in structure. Trivial mitral valve regurgitation. No evidence of mitral stenosis.  5. The aortic valve is normal in structure. Aortic valve regurgitation is not visualized. No aortic stenosis is present.  6. The inferior vena cava is dilated in size with <50% respiratory variability, suggesting right atrial pressure of 15 mmHg. FINDINGS  Left Ventricle: Left ventricular ejection fraction, by estimation, is 60 to 65%. The left ventricle has normal function. The left ventricle has no regional wall motion abnormalities. The left ventricular internal cavity size was normal in size. There is  no left ventricular hypertrophy. Left ventricular diastolic parameters are consistent with Grade II diastolic dysfunction (pseudonormalization). Right Ventricle: The right ventricular size is normal. No increase in right ventricular wall thickness. Right ventricular systolic function is normal. There is normal pulmonary artery systolic pressure. The tricuspid regurgitant velocity is 2.47 m/s, and  with an assumed right atrial pressure of 10 mmHg, the estimated right ventricular systolic pressure is 34.4 mmHg. Left Atrium: Left atrial size was moderately dilated. Right Atrium: Right atrial size was normal in size. Pericardium: There is no evidence of pericardial effusion. Mitral Valve: The mitral valve is normal in structure. Trivial mitral valve regurgitation. No evidence of  mitral valve stenosis. Tricuspid Valve: The tricuspid valve is normal in structure. Tricuspid valve regurgitation is mild . No evidence of tricuspid stenosis. Aortic Valve: The aortic valve is normal in structure. Aortic valve regurgitation is not  visualized. No aortic stenosis is present. Aortic valve peak gradient measures 14.4 mmHg. Pulmonic Valve: The pulmonic valve was normal in structure. Pulmonic valve regurgitation is not visualized. No evidence of pulmonic stenosis. Aorta: The aortic root is normal in size and structure. Venous: The inferior vena cava is dilated in size with less than 50% respiratory variability, suggesting right atrial pressure of 15 mmHg. IAS/Shunts: No atrial level shunt detected by color flow Doppler.  LEFT VENTRICLE PLAX 2D LVIDd:         5.20 cm   Diastology LVIDs:         2.90 cm   LV e' medial:    8.70 cm/s LV PW:         0.70 cm   LV E/e' medial:  12.9 LV IVS:        0.80 cm   LV e' lateral:   13.30 cm/s LVOT diam:     2.20 cm   LV E/e' lateral: 8.4 LV SV:         112 LV SV Index:   54 LVOT Area:     3.80 cm  RIGHT VENTRICLE             IVC RV S prime:     17.10 cm/s  IVC diam: 2.20 cm TAPSE (M-mode): 3.0 cm LEFT ATRIUM              Index        RIGHT ATRIUM           Index LA diam:        4.60 cm  2.21 cm/m   RA Area:     15.50 cm LA Vol (A2C):   63.6 ml  30.54 ml/m  RA Volume:   39.30 ml  18.87 ml/m LA Vol (A4C):   116.0 ml 55.70 ml/m LA Biplane Vol: 89.5 ml  42.98 ml/m  AORTIC VALVE AV Area (Vmax): 2.54 cm AV Vmax:        190.00 cm/s AV Peak Grad:   14.4 mmHg LVOT Vmax:      127.00 cm/s LVOT Vmean:     85.600 cm/s LVOT VTI:       0.294 m  AORTA Ao Root diam: 3.00 cm Ao Asc diam:  3.30 cm MITRAL VALVE                TRICUSPID VALVE MV Area (PHT): 2.91 cm     TR Peak grad:   24.4 mmHg MV Decel Time: 261 msec     TR Vmax:        247.00 cm/s MV E velocity: 112.00 cm/s MV A velocity: 89.10 cm/s   SHUNTS MV E/A ratio:  1.26         Systemic VTI:  0.29 m                              Systemic Diam: 2.20 cm Aditya Sabharwal Electronically signed by Ria Commander Signature Date/Time: 10/05/2023/5:35:57 PM    Final    IR Paracentesis Result Date: 10/05/2023 INDICATION: Patient with a history of MASH cirrhosis with recurrent ascites. Request for therapeutic paracentesis. EXAM: ULTRASOUND GUIDED DIAGNOSTIC AND THERAPEUTIC PARACENTESIS MEDICATIONS: 6 mL 1% lidocaine  COMPLICATIONS: None immediate. PROCEDURE: Informed written consent was obtained from the patient after a discussion of the risks, benefits and alternatives to treatment. A timeout was performed prior to the initiation of the procedure. Initial ultrasound scanning demonstrates a large amount  of ascites within the left lower abdominal quadrant. The left lower abdomen was prepped and draped in the usual sterile fashion. 1% lidocaine  was used for local anesthesia. Following this, a 19 gauge, 7-cm, Yueh catheter was introduced. An ultrasound image was saved for documentation purposes. The paracentesis was performed. The catheter was removed and a dressing was applied. The patient tolerated the procedure well without immediate post procedural complication. FINDINGS: A total of approximately 3 liters of clear, yellow fluid was removed. Samples were sent to the laboratory as requested by the clinical team. IMPRESSION: Successful ultrasound-guided paracentesis yielding 3 liters of peritoneal fluid. PLAN: If the patient eventually requires >/=2 paracenteses in a 30 day period, candidacy for formal evaluation by the [facility] will be assessed. Performed by Laymon Coast, NP Electronically Signed   By: Cordella Banner   On: 10/05/2023 15:29   CT Angio Chest PE W and/or Wo Contrast Result Date: 10/04/2023 CLINICAL DATA:  High probability for PE. Non alcoholic cirrhosis. Chills and weakness. Acute abdominal pain. EXAM: CT ANGIOGRAPHY CHEST CT ABDOMEN AND PELVIS WITH CONTRAST  TECHNIQUE: Multidetector CT imaging of the chest was performed using the standard protocol during bolus administration of intravenous contrast. Multiplanar CT image reconstructions and MIPs were obtained to evaluate the vascular anatomy. Multidetector CT imaging of the abdomen and pelvis was performed using the standard protocol during bolus administration of intravenous contrast. RADIATION DOSE REDUCTION: This exam was performed according to the departmental dose-optimization program which includes automated exposure control, adjustment of the mA and/or kV according to patient size and/or use of iterative reconstruction technique. CONTRAST:  75mL OMNIPAQUE  IOHEXOL  350 MG/ML SOLN COMPARISON:  CT abdomen and pelvis 06/06/2023 FINDINGS: CTA CHEST FINDINGS Cardiovascular: Satisfactory opacification of the pulmonary arteries to the segmental level. No evidence of pulmonary embolism. Heart is borderline enlarged/mildly enlarged. No pericardial effusion. Mediastinum/Nodes: No enlarged mediastinal, hilar, or axillary lymph nodes. Thyroid  gland, trachea, and esophagus demonstrate no significant findings. Lungs/Pleura: There is mild atelectasis in the bilateral lower lobes and lingula. There is no pleural effusion or pneumothorax. Musculoskeletal: No chest wall abnormality. No acute or significant osseous findings. Review of the MIP images confirms the above findings. CT ABDOMEN and PELVIS FINDINGS Hepatobiliary: There is nodular liver contour, unchanged. Gallstones are present. There is no biliary ductal dilatation. Pancreas: Unremarkable. No pancreatic ductal dilatation or surrounding inflammatory changes. Spleen: Spleen is enlarged measuring up to 18 cm similar to prior. Adrenals/Urinary Tract: There are 2 subcentimeter hypodensities in the left kidney which are too small to characterize, likely cysts. Otherwise, the kidneys, adrenal glands and bladder are within normal limits. Stomach/Bowel: There is diffuse wall  thickening of the ascending colon, transverse colon and descending colon. The appendix is within normal limits. No bowel obstruction, pneumatosis or free air visualized. Stomach is within normal limits. Vascular/Lymphatic: Aorta and IVC are normal in size. Splenic varices are present. No enlarged lymph nodes are identified. Reproductive: 4.8 cm cyst with hyperdense fluid fluid level noted in the left ovary. Right ovary is within normal limits. The uterus is surgically absent. Other: There is large volume ascites. There is no focal abdominal wall hernia or free air. Musculoskeletal: No fracture is seen. Review of the MIP images confirms the above findings. IMPRESSION: 1. No evidence for pulmonary embolism. 2. Mild bibasilar atelectasis. 3. Cirrhotic liver with sequela of portal hypertension including splenomegaly, varices and large volume ascites. 4. Diffuse wall thickening of the ascending, transverse and descending colon worrisome for colitis. 5. 4.8  cm left ovarian hemorrhagic cyst. This can be further evaluated with pelvic ultrasound. 6. Cholelithiasis. Electronically Signed   By: Greig Pique M.D.   On: 10/04/2023 21:47   CT ABDOMEN PELVIS W CONTRAST Result Date: 10/04/2023 CLINICAL DATA:  High probability for PE. Non alcoholic cirrhosis. Chills and weakness. Acute abdominal pain. EXAM: CT ANGIOGRAPHY CHEST CT ABDOMEN AND PELVIS WITH CONTRAST TECHNIQUE: Multidetector CT imaging of the chest was performed using the standard protocol during bolus administration of intravenous contrast. Multiplanar CT image reconstructions and MIPs were obtained to evaluate the vascular anatomy. Multidetector CT imaging of the abdomen and pelvis was performed using the standard protocol during bolus administration of intravenous contrast. RADIATION DOSE REDUCTION: This exam was performed according to the departmental dose-optimization program which includes automated exposure control, adjustment of the mA and/or kV according to  patient size and/or use of iterative reconstruction technique. CONTRAST:  75mL OMNIPAQUE  IOHEXOL  350 MG/ML SOLN COMPARISON:  CT abdomen and pelvis 06/06/2023 FINDINGS: CTA CHEST FINDINGS Cardiovascular: Satisfactory opacification of the pulmonary arteries to the segmental level. No evidence of pulmonary embolism. Heart is borderline enlarged/mildly enlarged. No pericardial effusion. Mediastinum/Nodes: No enlarged mediastinal, hilar, or axillary lymph nodes. Thyroid  gland, trachea, and esophagus demonstrate no significant findings. Lungs/Pleura: There is mild atelectasis in the bilateral lower lobes and lingula. There is no pleural effusion or pneumothorax. Musculoskeletal: No chest wall abnormality. No acute or significant osseous findings. Review of the MIP images confirms the above findings. CT ABDOMEN and PELVIS FINDINGS Hepatobiliary: There is nodular liver contour, unchanged. Gallstones are present. There is no biliary ductal dilatation. Pancreas: Unremarkable. No pancreatic ductal dilatation or surrounding inflammatory changes. Spleen: Spleen is enlarged measuring up to 18 cm similar to prior. Adrenals/Urinary Tract: There are 2 subcentimeter hypodensities in the left kidney which are too small to characterize, likely cysts. Otherwise, the kidneys, adrenal glands and bladder are within normal limits. Stomach/Bowel: There is diffuse wall thickening of the ascending colon, transverse colon and descending colon. The appendix is within normal limits. No bowel obstruction, pneumatosis or free air visualized. Stomach is within normal limits. Vascular/Lymphatic: Aorta and IVC are normal in size. Splenic varices are present. No enlarged lymph nodes are identified. Reproductive: 4.8 cm cyst with hyperdense fluid fluid level noted in the left ovary. Right ovary is within normal limits. The uterus is surgically absent. Other: There is large volume ascites. There is no focal abdominal wall hernia or free air.  Musculoskeletal: No fracture is seen. Review of the MIP images confirms the above findings. IMPRESSION: 1. No evidence for pulmonary embolism. 2. Mild bibasilar atelectasis. 3. Cirrhotic liver with sequela of portal hypertension including splenomegaly, varices and large volume ascites. 4. Diffuse wall thickening of the ascending, transverse and descending colon worrisome for colitis. 5. 4.8 cm left ovarian hemorrhagic cyst. This can be further evaluated with pelvic ultrasound. 6. Cholelithiasis. Electronically Signed   By: Greig Pique M.D.   On: 10/04/2023 21:47   DG Chest 2 View Result Date: 10/04/2023 CLINICAL DATA:  Possible sepsis EXAM: CHEST - 2 VIEW COMPARISON:  04/14/2022 FINDINGS: Cardiac shadow is within normal limits. Minimal left basilar atelectasis is noted. No bony abnormality is seen. IMPRESSION: Minimal left basilar atelectasis. Electronically Signed   By: Oneil Devonshire M.D.   On: 10/04/2023 20:47   Scheduled Meds:  fidaxomicin  200 mg Oral BID   FLUoxetine   40 mg Oral BID   furosemide   60 mg Oral Daily   insulin  aspart  0-15 Units Subcutaneous TID WC  insulin  aspart  0-5 Units Subcutaneous QHS   [START ON 10/07/2023] insulin  regular human CONCENTRATED  40 Units Subcutaneous TID WC   lactulose  20 g Oral BID   montelukast   10 mg Oral QHS   pantoprazole   40 mg Oral Daily   phytonadione  10 mg Oral Daily   potassium chloride  40 mEq Oral BID   sodium chloride  flush  3 mL Intravenous Q12H   sodium chloride  flush  3 mL Intravenous Q12H   spironolactone  150 mg Oral Daily   Continuous Infusions:   LOS: 2 days   Alejandro Marker, DO [facility] Available via Epic secure chat 7am-7pm After these hours, please refer to coverage provider listed on [email or web address] 10/06/2023, 7:14 PM

## 2023-10-06 NOTE — Plan of Care (Signed)
  Problem: Education: Goal: Individualized Educational Video(s) Outcome: Progressing   Problem: Coping: Goal: Ability to adjust to condition or change in health will improve Outcome: Progressing   Problem: Fluid Volume: Goal: Ability to maintain a balanced intake and output will improve Outcome: Progressing

## 2023-10-07 ENCOUNTER — Other Ambulatory Visit (HOSPITAL_COMMUNITY): Payer: Self-pay

## 2023-10-07 DIAGNOSIS — K7290 Hepatic failure, unspecified without coma: Secondary | ICD-10-CM | POA: Diagnosis not present

## 2023-10-07 DIAGNOSIS — J9601 Acute respiratory failure with hypoxia: Secondary | ICD-10-CM | POA: Diagnosis not present

## 2023-10-07 DIAGNOSIS — G9341 Metabolic encephalopathy: Secondary | ICD-10-CM | POA: Diagnosis not present

## 2023-10-07 DIAGNOSIS — K529 Noninfective gastroenteritis and colitis, unspecified: Secondary | ICD-10-CM | POA: Diagnosis not present

## 2023-10-07 LAB — CBC WITH DIFFERENTIAL/PLATELET
Abs Immature Granulocytes: 0.01 10*3/uL (ref 0.00–0.07)
Basophils Absolute: 0 10*3/uL (ref 0.0–0.1)
Basophils Relative: 0 %
Eosinophils Absolute: 0.2 10*3/uL (ref 0.0–0.5)
Eosinophils Relative: 5 %
HCT: 30 % — ABNORMAL LOW (ref 36.0–46.0)
Hemoglobin: 10.4 g/dL — ABNORMAL LOW (ref 12.0–15.0)
Immature Granulocytes: 0 %
Lymphocytes Relative: 19 %
Lymphs Abs: 0.6 10*3/uL — ABNORMAL LOW (ref 0.7–4.0)
MCH: 33 pg (ref 26.0–34.0)
MCHC: 34.7 g/dL (ref 30.0–36.0)
MCV: 95.2 fL (ref 80.0–100.0)
Monocytes Absolute: 0.3 10*3/uL (ref 0.1–1.0)
Monocytes Relative: 10 %
Neutro Abs: 2 10*3/uL (ref 1.7–7.7)
Neutrophils Relative %: 66 %
Platelets: 66 10*3/uL — ABNORMAL LOW (ref 150–400)
RBC: 3.15 MIL/uL — ABNORMAL LOW (ref 3.87–5.11)
RDW: 14.9 % (ref 11.5–15.5)
WBC: 3.1 10*3/uL — ABNORMAL LOW (ref 4.0–10.5)
nRBC: 0 % (ref 0.0–0.2)

## 2023-10-07 LAB — VITAMIN B12: Vitamin B-12: 530 pg/mL (ref 180–914)

## 2023-10-07 LAB — PROTIME-INR
INR: 1.5 — ABNORMAL HIGH (ref 0.8–1.2)
Prothrombin Time: 19 s — ABNORMAL HIGH (ref 11.4–15.2)

## 2023-10-07 LAB — COMPREHENSIVE METABOLIC PANEL WITH GFR
ALT: 34 U/L (ref 0–44)
AST: 60 U/L — ABNORMAL HIGH (ref 15–41)
Albumin: 2.8 g/dL — ABNORMAL LOW (ref 3.5–5.0)
Alkaline Phosphatase: 84 U/L (ref 38–126)
Anion gap: 7 (ref 5–15)
BUN: 7 mg/dL (ref 6–20)
CO2: 21 mmol/L — ABNORMAL LOW (ref 22–32)
Calcium: 8.1 mg/dL — ABNORMAL LOW (ref 8.9–10.3)
Chloride: 105 mmol/L (ref 98–111)
Creatinine, Ser: 0.71 mg/dL (ref 0.44–1.00)
GFR, Estimated: >60 mL/min (ref >60)
Glucose, Bld: 226 mg/dL — ABNORMAL HIGH (ref 70–99)
Potassium: 3.6 mmol/L (ref 3.5–5.1)
Sodium: 133 mmol/L — ABNORMAL LOW (ref 135–145)
Total Bilirubin: 2 mg/dL — ABNORMAL HIGH (ref 0.0–1.2)
Total Protein: 6.4 g/dL — ABNORMAL LOW (ref 6.5–8.1)

## 2023-10-07 LAB — GLUCOSE, CAPILLARY
Glucose-Capillary: 113 mg/dL — ABNORMAL HIGH (ref 70–99)
Glucose-Capillary: 279 mg/dL — ABNORMAL HIGH (ref 70–99)
Glucose-Capillary: 329 mg/dL — ABNORMAL HIGH (ref 70–99)

## 2023-10-07 LAB — MAGNESIUM: Magnesium: 1.8 mg/dL (ref 1.7–2.4)

## 2023-10-07 LAB — FOLATE: Folate: 36.3 ng/mL (ref >5.9)

## 2023-10-07 LAB — PHOSPHORUS: Phosphorus: 2.9 mg/dL (ref 2.5–4.6)

## 2023-10-07 MED ORDER — ACETAMINOPHEN 325 MG PO TABS
650.0000 mg | ORAL_TABLET | Freq: Four times a day (QID) | ORAL | 0 refills | Status: DC | PRN
  Filled 2023-10-07: qty 20, 3d supply, fill #0

## 2023-10-07 MED ORDER — FIDAXOMICIN 200 MG PO TABS
200.0000 mg | ORAL_TABLET | Freq: Two times a day (BID) | ORAL | 0 refills | Status: AC
  Filled 2023-10-07: qty 16, 8d supply, fill #0

## 2023-10-07 MED ORDER — LACTULOSE 10 GM/15ML PO SOLN
20.0000 g | Freq: Two times a day (BID) | ORAL | 0 refills | Status: DC
  Filled 2023-10-07: qty 236, 4d supply, fill #0

## 2023-10-07 NOTE — Progress Notes (Signed)
 Pt. Ambulating in hallway without difficulty maintaining O2 sat of 97% on R/A, no distress noted  Doyal Sias

## 2023-10-07 NOTE — TOC Transition Note (Signed)
 Transition of Care [facility]) - Discharge Note   Patient Details  Name: Laurie Sutton MRN: [id number] Date of Birth: 03-03-1981  [facility] CM/SW Contact:  Tom-Johnson, Harvest Muskrat, RN Phone Number: 10/07/2023, 12:34 PM   Clinical Narrative:     Patient is scheduled for discharge today.  Readmission Risk Assessment done. Outpatient f/u, hospital f/u and discharge instructions on AVS. Prescriptions sent to [facility] pharmacy and patient will receive meds prior discharge. No TOC needs or recommendations noted. Husband, Adam to transport at discharge.  No further TOC needs noted.      Final next level of care: Home/Self Care Barriers to Discharge: Barriers Resolved   Patient Goals and CMS Choice Patient states their goals for this hospitalization and ongoing recovery are:: To return home CMS Medicare.gov Compare Post Acute Care list provided to:: Patient Choice offered to / list presented to : NA      Discharge Placement                Patient to be transferred to facility by: Husband Name of family member notified: Adam    Discharge Plan and Services Additional resources added to the After Visit Summary for                  DME Arranged: N/A DME Agency: NA       HH Arranged: NA HH Agency: NA        Social Drivers of Health (SDOH) Interventions SDOH Screenings   Food Insecurity: No Food Insecurity (10/05/2023)  Housing: Low Risk  (10/05/2023)  Transportation Needs: No Transportation Needs (10/05/2023)  Utilities: Not At Risk (10/05/2023)  Depression (PHQ2-9): Low Risk  (12/02/2022)  Recent Concern: Depression (PHQ2-9) - Medium Risk (11/08/2022)  Financial Resource Strain: Low Risk  (09/19/2022)  Physical Activity: Insufficiently Active (06/04/2021)  Social Connections: Socially Integrated (10/05/2023)  Stress: Stress Concern Present (09/19/2022)  Tobacco Use: Medium Risk (10/04/2023)     Readmission Risk Interventions    10/06/2023    3:09 PM   Readmission Risk Prevention Plan  Post Dischage Appt Complete  Medication Screening Complete  Transportation Screening Complete

## 2023-10-07 NOTE — Progress Notes (Signed)
 DISCHARGE NOTE HOME MIKI LABUDA to be discharged Home per MD order. Discussed prescriptions and follow up appointments with the patient. Prescriptions given to patient; medication list explained in detail. Patient verbalized understanding.  Skin clean, dry and intact without evidence of skin break down, no evidence of skin tears noted. IV catheter discontinued intact. Site without signs and symptoms of complications. Dressing and pressure applied. Pt denies pain at the site currently. No complaints noted.  Patient free of lines, drains, and wounds.   An After Visit Summary (AVS) was printed and given to the patient. Patient escorted via wheelchair, and discharged home via private auto.  Kolden Dupee A Proctor-Gann, RN

## 2023-10-09 LAB — CULTURE, BLOOD (ROUTINE X 2)
Culture: NO GROWTH
Culture: NO GROWTH
Special Requests: ADEQUATE
Special Requests: ADEQUATE

## 2023-10-09 NOTE — Discharge Summary (Signed)
 Physician Discharge Summary   Patient: Laurie Sutton MRN: [id number] DOB: Dec 12, 1980  Admit date:     10/04/2023  Discharge date: 10/07/2023  Discharge Physician: Alejandro Marker, DO   PCP: [name], MD   Recommendations at discharge:   Follow-up with PCP within 1 to 2 weeks repeat CBC, CMP, mag, Phos within 1 week Follow-up with gastroenterology in outpatient setting continue to remain compliant with medications. Follow-up with psychiatric services in outpatient setting Have pelvic ultrasound in the outpatient setting for left ovarian hemorrhagic cyst  Discharge Diagnoses: Principal Problem:   Decompensated hepatic cirrhosis (HCC) Active Problems:   Acute metabolic encephalopathy   Sepsis (HCC)   Colitis   Acute hypoxic respiratory failure (HCC)   Mild bibasilar atelectasis   Depression with anxiety   Liver cirrhosis secondary to NASH (HCC)   Non-insulin  dependent type 2 diabetes mellitus (HCC)   Chronic idiopathic thrombocytopenia (HCC)   Peripheral neuropathy   SIRS (systemic inflammatory response syndrome) (HCC)   Other ascites   Fever   Campylobacter diarrhea  Resolved Problems:   * No resolved hospital problems. [facility] Course: The patient is a 43 year old obese chronically ill-appearing Caucasian female with past medical history significant for but not limited to [place] cirrhosis, history of esophageal varices, chronic, cytopenia, frequent UTIs, depression and anxiety, diabetes mellitus type 2, GERD, history of menorrhagia, peripheral neuropathy and other comorbidities including recent dental extraction who presented with shortness of breath to the ED.  She had been noncompliant with her diuretics and had a tooth extraction and has been taking antibiotics after tooth extraction.  Further workup was done and she is found to be febrile, hypotensive and tachypneic with requirement of supplemental oxygen.  She is found to have decompensated hepatic cirrhosis and  acute diastolic CHF.  Subsequently she underwent a paracentesis and GI was consulted.  Husband notes that she has been notably more depressed to the point where she lays in bed most of the time so Psychiatry evaluated and recommending outpatient follow-up with continuation of her current medications.  Further studies show that she is positive for Campylobacter and C. difficile so she will just be continued on Pradaxa mycin for now.  Her home diuretics have been resumed.  She is steady improved and did not desaturate on home Eliquis screening.  She is stable for discharge at this time will need to follow-up with PCP and gastroenterology outpatient setting.  Assessment and Plan:  Decompensated Hepatic Cirrhosis / Hyperbilirubinemia Acute metabolic encephalopathy in the setting of hyperammonemia History of portal hypertensive gastropathy History of esophageal varices -Patient presented emergency department complaining of increased confusion, shortness of breath, cough, dysuria, generalized weakness, abdominal discomfort and poor appetite.  At presentation to ED patient found hypotensive, and low-grade fever.  O2 sat dropped to 88% has been placed on nasal cannula oxygen.  Patient reported she has not been taking Lasix  and spironolactone  for almost 1 week. -CT abdomen pelvis showing no evidence of PE, mild bibasilar atelectasis, cirrhotic liver with sequela of portal hypertension including splenomegaly, varices and large volume ascites.  Notably there is also diffuse wall thickening of the ascending, transverse and descending colon which was worrisome for colitis -CMP showing elevated AST and elevated bilirubin.  Elevated ammonia 89 on admission. - Decompensated hepatic cirrhosis with ascites in the setting of medication noncompliance. - Given patient is hypotensive holding Lasix  and is bilateral at this time. - Consulting IR for paracentesis and will send paracentesis fluid study. -Was given albumin  50  g and midodrine  10 mg yesterday in the setting of hypotension - Starting lactulose  30 g 3 times daily was reduced to 20 g twice daily.  Goal to have at least 2-3 bowel bowel movements in a day. -Holding propranolol  in the setting of low blood pressure. -GI consulted for further evaluation.  Her MELD 3.0 score was 22 and she has no evidence of SBP.  GI is recommending 2 g sodium restricted salt diet; her home diuretics with Lasix  60 mg p.o. daily and spironolactone  150 mg p.o. daily were resumed this morning.  She did well and GI cleared her for discharge.  She is medically stable for discharge at this time.  Sepsis secondary to colitis and suspect this is related to the C. Difficile and Campylobacter  -Patient meets sepsis criteria in the setting of tachycardia, low-grade fever and hypotension.  CT abdomen pelvis showed evidence of colitis as above CTA chest ruled out PE and pneumonia however it showed bibasilar atelectasis. - Blood cultures are pending.  Lactic acid within normal range. - History of penicillin allergy.  Pharmacy recommended change Zosyn to cefepime /metronidazole  but will discontinue this and transition to Dificid  given that her C. difficile antigen was positive and her toxigenic C. difficile via PCR was also positive -GI pathogen panel positive for Campylobacter -After giving vancomycin  patient started having rash of the forehead, upper chest wall with associated itchiness.  Patient also seemed flushed with redness of the cheeks.  No evidence of new respiratory distress, stridor.   There was concern for development of allergic reaction or early development of red man syndrome in setting of vancomycin  use. -Giving IV Benadryl  and Solu-Medrol .  DC vancomycin  completely. -Continue to monitor for any temperatures and follow WBC curve.  Continue supportive care and continue with fluid Oxymycin at discharge.   Left ovarian hemorrhagic cyst: Noted to be 4.8 cm and can be further evaluated  with a pelvic ultrasound but will defer for now   Acute hypoxic respiratory failure in the setting of bibasilar atelectasis and volume overload from acute on chronic diastolic dysfunction: Respiratory Virus Panel Negative as well as COVID.  Chest x-ray and CT chest showed bilateral atelectasis.  In the ED patient O2 sat dropped below 88% placed on nasal cannula oxygen. -Patient being volume overloaded with ascites in the setting of medication noncompliance.  Due to low blood pressure was unable to start Lasix  and spironolactone  at this time.  Giving albumin  and plan for paracentesis.  Paracentesis done and removed 3 L. Given dose of IV Lasix  40 mg 6/25. Resumed Home Diuretics now CTM for S/Sx of Volume overload -Continue supportive care, supplemental oxygen, spirometry, flutter valve and wean down oxygen to room air as patient tolerates.  Ambulatory home O2 screen done and patient did not desaturate.  She is stable for discharge at this time  Recent Dental Extraction:  Reported recent dental extraction have not received any prophylactic antibiotic.  Physical exam showed murmur.  Echocardiogram to rule out vegetation and this is done and showed no Vegetation but did show G2DD.  Continue to monitor and follow-up in outpatient setting  Hypokalemia: Mild. K+ was 3.1. Replete w/ po KCL 40 mEQ BID x2. CTM and Replete as Necessary. Repeat CMP in the AM    Depression / Generalized Anxiety Disorder: C/w Fluoxetine  40 mg po BID. husband believes that she is severely depressed and has not been engaging in very much activities has been laying in bed most of the time.  Psychiatry has been  consulted for evaluation and management given that the patient's husband states that this is causing structure between her marriage.  Psychiatry consulted patient did endorse some mood changes but states that she is not as depressed and denied any SI/HI/AVH.  Psychiatry recommended continuing full duloxetine 40 mg twice daily and  outpatient psychiatric services and follow-up  Reactive airway disease: Continue Albuterol  2.5 mg Neb q6hprn Wheezing and SOB and Montelukast  10 mg qHS daily.  Insulin  Dependent Diabetes Mellitus Type 2: HbA1c was 6.1. Received IV Solumedrol yesterday and now CBGs elevated. C/w Moderate Novolog  [facility] AC/HS and Diabetes Education Coordinator consulted and recommending Humulin  U-500 30 Units TID w/ Meals but given her continued hyperglycemia will increase to 40 U. CTM CBGs per Protocol. CBGs ranging from 113-329  Pancytopenia: Fluctuating, in the setting of NASH Cirrhosis and infection from above. Has Chronic Thrombocytopenia 2/2 to Cirrhosis. CBC Trend: Recent Labs  Lab 10/04/23 2002 10/04/23 2208 10/05/23 0459 10/06/23 0552 10/07/23 0717  WBC 4.4  --  2.9* 4.6 3.1*  HGB 10.7*   < > 9.7* 9.8* 10.4*  HCT 31.3*   < > 28.7* 28.0* 30.0*  MCV 93.4  --  95.7 94.6 95.2  PLT 69*  --  49* 58* 66*   < > = values in this interval not displayed.  -Checked Anemia Panel and showed an iron level of 46, UIBC 165, TIBC of 211, saturation ratios of 22%, ferritin level of 108. CTM for S/Sx of Bleeding; no overt bleeding noted. CTM and Trend and repeat CBC within 1 week  GERD/GI Prophylaxis: C/w Pantoprazole  40 mg po daily   Hypoalbuminemia: Patient's Albumin  Level went from 2.4 -> 3.1 -> 2.8 again. CTM and Trend and repeat CMP within 1 week  Class II Obesity -Complicates overall prognosis and care -Estimated body mass index is 35.51 kg/m as calculated from the following:   Height as of this encounter: 5' 6 (1.676 m).   Weight as of this encounter: 99.8 kg.  -Weight Loss and Dietary Counseling given  Consultants: Gastroenterology, psychiatry, IR Procedures performed: Paracentesis Disposition: Home Diet recommendation:  Discharge Diet Orders (From admission, onward)     Start     Ordered   10/07/23 0000  Diet - low sodium heart healthy       Comments: 2 Gram Sodium Diet   10/07/23 1227    10/07/23 0000  Diet Carb Modified        10/07/23 1227           Cardiac diet -2 g sodium restricted diet DISCHARGE MEDICATION: Allergies as of 10/07/2023       Reactions   Vancomycin  Itching   Amoxicillin Hives, Itching   Azithromycin     DOES NOT WORK   Dulaglutide  Other (See Comments)   constipation and stomach issues  **Trulicity **   Empagliflozin -metformin  Hcl Er Other (See Comments), Swelling   Januvia  [sitagliptin ] Other (See Comments)   HURTS STOAMCH   Latex    IRRITATES VAGINAL AREA   Metformin     Other Reaction(s): achy all over   Penicillins Hives   Has patient had a PCN reaction causing immediate rash, facial/tongue/throat swelling, SOB or lightheadedness with hypotension: yes. Rash  Has patient had a PCN reaction causing severe rash involving mucus membranes or skin necrosis: Yes- rash and hives all over body  Has patient had a PCN reaction that required hospitalization No Has patient had a PCN reaction occurring within the last 10 years: Yes  If all of the above answers are  NO, then may proceed with Cephalosporin use.        Medication List     STOP taking these medications    colchicine  0.6 MG tablet   ibuprofen  800 MG tablet Commonly known as: ADVIL    LORazepam  0.5 MG tablet Commonly known as: ATIVAN    Sutab  [phone] MG Tabs Generic drug: Sodium Sulfate-Mag Sulfate-KCl       TAKE these medications    acetaminophen  325 MG tablet Commonly known as: TYLENOL  Take 2 tablets (650 mg total) by mouth every 6 (six) hours as needed for mild pain (pain score 1-3) or fever (or Fever >/= 101).   albuterol  108 (90 Base) MCG/ACT inhaler Commonly known as: VENTOLIN  HFA INHALE 2 PUFFS INTO THE LUNGS UP TO EVERY 6 HOURS AS NEEDED FOR WHEEZING/SHORTNESS OF BREATH   clotrimazole -betamethasone  cream Commonly known as: LOTRISONE  APPLY 1 APPLICATION TOPICALLY TWICE A DAY AS NEEDED   Dexcom G7 Sensor Misc Use 1 sensor for continuous glucose  monitoring every 10 days for 30 days What changed: Another medication with the same name was removed. Continue taking this medication, and follow the directions you see here.   Dificid  200 MG Tabs tablet Generic drug: fidaxomicin  Take 1 tablet (200 mg total) by mouth 2 (two) times daily for 8 days.   FLUoxetine  40 MG capsule Commonly known as: PROZAC  TAKE 1 CAPSULE BY MOUTH TWICE A DAY   furosemide  20 MG tablet Commonly known as: LASIX  Take 60 mg by mouth daily.   HumuLIN  R U-500 KwikPen 500 UNIT/ML KwikPen Generic drug: insulin  regular human CONCENTRATED Use 120 units at breakfast, 100 at lunch, and 100 at dinner What changed: additional instructions   lactulose  10 GM/15ML solution Commonly known as: CHRONULAC  Take 30 mLs (20 g total) by mouth 2 (two) times daily. What changed: how much to take   montelukast  10 MG tablet Commonly known as: SINGULAIR  Take 1 tablet (10 mg total) by mouth at bedtime.   Mounjaro  7.5 MG/0.5ML Pen Generic drug: tirzepatide  Inject 7.5 mg Subcutaneous once a week 30 days What changed: Another medication with the same name was removed. Continue taking this medication, and follow the directions you see here.   mupirocin  ointment 2 % Commonly known as: BACTROBAN  Apply 1 Application topically 2 (two) times daily.   omeprazole  40 MG capsule Commonly known as: PRILOSEC TAKE 1 CAPSULE (40 MG TOTAL) BY MOUTH IN THE MORNING   ondansetron  4 MG disintegrating tablet Commonly known as: ZOFRAN -ODT Take 1 tablet (4 mg total) by mouth every 8 (eight) hours as needed for nausea or vomiting.   propranolol  60 MG tablet Commonly known as: INDERAL  Take 1 tablet (60 mg total) by mouth 2 (two) times daily.   spironolactone  50 MG tablet Commonly known as: ALDACTONE  Take 150 mg by mouth daily.       Discharge Exam: Filed Weights   10/04/23 1940  Weight: 99.8 kg   Vitals:   10/07/23 0412 10/07/23 1048  BP: (!) 106/49 (!) 110/59  Pulse: 68 74  Resp:  18   Temp: 98.3 F (36.8 C) 98.1 F (36.7 C)  SpO2: 98% 96%   Examination: Physical Exam:  Constitutional: WN/WD obese Caucasian female in no acute distress Respiratory: Diminished to auscultation bilaterally, no wheezing, rales, rhonchi or crackles. Normal respiratory effort and patient is not tachypenic. No accessory muscle use.  Unlabored breathing Cardiovascular: RRR, no murmurs / rubs / gallops. S1 and S2 auscultated.  Mild extremity edema.  Abdomen: Soft, non-tender, distended secondary body habitus  and ascites bowel sounds positive.  GU: Deferred. Musculoskeletal: No clubbing / cyanosis of digits/nails. No joint deformity upper and lower extremities.  Skin: No rashes, lesions, ulcers on limited skin evaluation. No induration; Warm and dry.  Neurologic: CN 2-12 grossly intact with no focal deficits. Romberg sign and cerebellar reflexes not assessed.  Psychiatric: Normal judgment and insight. Alert and oriented x 3.   Condition at discharge: stable  The results of significant diagnostics from this hospitalization (including imaging, microbiology, ancillary and laboratory) are listed below for reference.   Imaging Studies: DG CHEST PORT 1 VIEW Result Date: 10/06/2023 CLINICAL DATA:  [id number] SOB (shortness of breath) EXAM: PORTABLE CHEST 1 VIEW COMPARISON:  Chest radiograph 10/04/2023 and chest CTA 10/04/2023 FINDINGS: Both lungs are clear. No focal airspace disease or lung consolidation. Heart and mediastinum are within normal limits. Trachea is midline. Patient is slightly rotated towards the left. Negative for a pneumothorax. IMPRESSION: No acute cardiopulmonary disease. Electronically Signed   By: Juliene Balder M.D.   On: 10/06/2023 09:10   ECHOCARDIOGRAM COMPLETE Result Date: 10/05/2023    ECHOCARDIOGRAM REPORT   Patient Name:   SUMAN TRIVEDI Date of Exam: 10/05/2023 Medical Rec #:  [id number]         Height:       66.0 in Accession #:    [id number]        Weight:       220.0 lb  Date of Birth:  1980-08-16        BSA:          2.082 m Patient Age:    42 years          BP:           106/54 mmHg Patient Gender: F                 HR:           71 bpm. Exam Location:  Inpatient Procedure: 2D Echo, Cardiac Doppler and Color Doppler (Both Spectral and Color            Flow Doppler were utilized during procedure). Indications:    Murmor R01.1  History:        Patient has no prior history of Echocardiogram examinations.                 Migraines; Risk Factors:Diabetes.  Sonographer:    Thea Norlander RCS Referring Phys: SUBRINA SUNDIL IMPRESSIONS  1. Left ventricular ejection fraction, by estimation, is 60 to 65%. The left ventricle has normal function. The left ventricle has no regional wall motion abnormalities. Left ventricular diastolic parameters are consistent with Grade II diastolic dysfunction (pseudonormalization).  2. Right ventricular systolic function is normal. The right ventricular size is normal. There is normal pulmonary artery systolic pressure.  3. Left atrial size was moderately dilated.  4. The mitral valve is normal in structure. Trivial mitral valve regurgitation. No evidence of mitral stenosis.  5. The aortic valve is normal in structure. Aortic valve regurgitation is not visualized. No aortic stenosis is present.  6. The inferior vena cava is dilated in size with <50% respiratory variability, suggesting right atrial pressure of 15 mmHg. FINDINGS  Left Ventricle: Left ventricular ejection fraction, by estimation, is 60 to 65%. The left ventricle has normal function. The left ventricle has no regional wall motion abnormalities. The left ventricular internal cavity size was normal in size. There is  no left ventricular hypertrophy. Left ventricular diastolic parameters are consistent with  Grade II diastolic dysfunction (pseudonormalization). Right Ventricle: The right ventricular size is normal. No increase in right ventricular wall thickness. Right ventricular systolic  function is normal. There is normal pulmonary artery systolic pressure. The tricuspid regurgitant velocity is 2.47 m/s, and  with an assumed right atrial pressure of 10 mmHg, the estimated right ventricular systolic pressure is 34.4 mmHg. Left Atrium: Left atrial size was moderately dilated. Right Atrium: Right atrial size was normal in size. Pericardium: There is no evidence of pericardial effusion. Mitral Valve: The mitral valve is normal in structure. Trivial mitral valve regurgitation. No evidence of mitral valve stenosis. Tricuspid Valve: The tricuspid valve is normal in structure. Tricuspid valve regurgitation is mild . No evidence of tricuspid stenosis. Aortic Valve: The aortic valve is normal in structure. Aortic valve regurgitation is not visualized. No aortic stenosis is present. Aortic valve peak gradient measures 14.4 mmHg. Pulmonic Valve: The pulmonic valve was normal in structure. Pulmonic valve regurgitation is not visualized. No evidence of pulmonic stenosis. Aorta: The aortic root is normal in size and structure. Venous: The inferior vena cava is dilated in size with less than 50% respiratory variability, suggesting right atrial pressure of 15 mmHg. IAS/Shunts: No atrial level shunt detected by color flow Doppler.  LEFT VENTRICLE PLAX 2D LVIDd:         5.20 cm   Diastology LVIDs:         2.90 cm   LV e' medial:    8.70 cm/s LV PW:         0.70 cm   LV E/e' medial:  12.9 LV IVS:        0.80 cm   LV e' lateral:   13.30 cm/s LVOT diam:     2.20 cm   LV E/e' lateral: 8.4 LV SV:         112 LV SV Index:   54 LVOT Area:     3.80 cm  RIGHT VENTRICLE             IVC RV S prime:     17.10 cm/s  IVC diam: 2.20 cm TAPSE (M-mode): 3.0 cm LEFT ATRIUM              Index        RIGHT ATRIUM           Index LA diam:        4.60 cm  2.21 cm/m   RA Area:     15.50 cm LA Vol (A2C):   63.6 ml  30.54 ml/m  RA Volume:   39.30 ml  18.87 ml/m LA Vol (A4C):   116.0 ml 55.70 ml/m LA Biplane Vol: 89.5 ml  42.98 ml/m   AORTIC VALVE AV Area (Vmax): 2.54 cm AV Vmax:        190.00 cm/s AV Peak Grad:   14.4 mmHg LVOT Vmax:      127.00 cm/s LVOT Vmean:     85.600 cm/s LVOT VTI:       0.294 m  AORTA Ao Root diam: 3.00 cm Ao Asc diam:  3.30 cm MITRAL VALVE                TRICUSPID VALVE MV Area (PHT): 2.91 cm     TR Peak grad:   24.4 mmHg MV Decel Time: 261 msec     TR Vmax:        247.00 cm/s MV E velocity: 112.00 cm/s MV A velocity: 89.10 cm/s   SHUNTS MV E/A  ratio:  1.26         Systemic VTI:  0.29 m                             Systemic Diam: 2.20 cm Aditya Sabharwal Electronically signed by Ria Commander Signature Date/Time: 10/05/2023/5:35:57 PM    Final    IR Paracentesis Result Date: 10/05/2023 INDICATION: Patient with a history of MASH cirrhosis with recurrent ascites. Request for therapeutic paracentesis. EXAM: ULTRASOUND GUIDED DIAGNOSTIC AND THERAPEUTIC PARACENTESIS MEDICATIONS: 6 mL 1% lidocaine  COMPLICATIONS: None immediate. PROCEDURE: Informed written consent was obtained from the patient after a discussion of the risks, benefits and alternatives to treatment. A timeout was performed prior to the initiation of the procedure. Initial ultrasound scanning demonstrates a large amount of ascites within the left lower abdominal quadrant. The left lower abdomen was prepped and draped in the usual sterile fashion. 1% lidocaine  was used for local anesthesia. Following this, a 19 gauge, 7-cm, Yueh catheter was introduced. An ultrasound image was saved for documentation purposes. The paracentesis was performed. The catheter was removed and a dressing was applied. The patient tolerated the procedure well without immediate post procedural complication. FINDINGS: A total of approximately 3 liters of clear, yellow fluid was removed. Samples were sent to the laboratory as requested by the clinical team. IMPRESSION: Successful ultrasound-guided paracentesis yielding 3 liters of peritoneal fluid. PLAN: If the patient eventually  requires >/=2 paracenteses in a 30 day period, candidacy for formal evaluation by the [facility] will be assessed. Performed by Laymon Coast, NP Electronically Signed   By: Cordella Banner   On: 10/05/2023 15:29   CT Angio Chest PE W and/or Wo Contrast Result Date: 10/04/2023 CLINICAL DATA:  High probability for PE. Non alcoholic cirrhosis. Chills and weakness. Acute abdominal pain. EXAM: CT ANGIOGRAPHY CHEST CT ABDOMEN AND PELVIS WITH CONTRAST TECHNIQUE: Multidetector CT imaging of the chest was performed using the standard protocol during bolus administration of intravenous contrast. Multiplanar CT image reconstructions and MIPs were obtained to evaluate the vascular anatomy. Multidetector CT imaging of the abdomen and pelvis was performed using the standard protocol during bolus administration of intravenous contrast. RADIATION DOSE REDUCTION: This exam was performed according to the departmental dose-optimization program which includes automated exposure control, adjustment of the mA and/or kV according to patient size and/or use of iterative reconstruction technique. CONTRAST:  75mL OMNIPAQUE  IOHEXOL  350 MG/ML SOLN COMPARISON:  CT abdomen and pelvis 06/06/2023 FINDINGS: CTA CHEST FINDINGS Cardiovascular: Satisfactory opacification of the pulmonary arteries to the segmental level. No evidence of pulmonary embolism. Heart is borderline enlarged/mildly enlarged. No pericardial effusion. Mediastinum/Nodes: No enlarged mediastinal, hilar, or axillary lymph nodes. Thyroid  gland, trachea, and esophagus demonstrate no significant findings. Lungs/Pleura: There is mild atelectasis in the bilateral lower lobes and lingula. There is no pleural effusion or pneumothorax. Musculoskeletal: No chest wall abnormality. No acute or significant osseous findings. Review of the MIP images confirms the above findings. CT ABDOMEN and PELVIS FINDINGS Hepatobiliary: There is  nodular liver contour, unchanged. Gallstones are present. There is no biliary ductal dilatation. Pancreas: Unremarkable. No pancreatic ductal dilatation or surrounding inflammatory changes. Spleen: Spleen is enlarged measuring up to 18 cm similar to prior. Adrenals/Urinary Tract: There are 2 subcentimeter hypodensities in the left kidney which are too small to characterize, likely cysts. Otherwise, the kidneys, adrenal glands and bladder are within normal limits. Stomach/Bowel: There is diffuse wall thickening of the ascending colon,  transverse colon and descending colon. The appendix is within normal limits. No bowel obstruction, pneumatosis or free air visualized. Stomach is within normal limits. Vascular/Lymphatic: Aorta and IVC are normal in size. Splenic varices are present. No enlarged lymph nodes are identified. Reproductive: 4.8 cm cyst with hyperdense fluid fluid level noted in the left ovary. Right ovary is within normal limits. The uterus is surgically absent. Other: There is large volume ascites. There is no focal abdominal wall hernia or free air. Musculoskeletal: No fracture is seen. Review of the MIP images confirms the above findings. IMPRESSION: 1. No evidence for pulmonary embolism. 2. Mild bibasilar atelectasis. 3. Cirrhotic liver with sequela of portal hypertension including splenomegaly, varices and large volume ascites. 4. Diffuse wall thickening of the ascending, transverse and descending colon worrisome for colitis. 5. 4.8 cm left ovarian hemorrhagic cyst. This can be further evaluated with pelvic ultrasound. 6. Cholelithiasis. Electronically Signed   By: Greig Pique M.D.   On: 10/04/2023 21:47   CT ABDOMEN PELVIS W CONTRAST Result Date: 10/04/2023 CLINICAL DATA:  High probability for PE. Non alcoholic cirrhosis. Chills and weakness. Acute abdominal pain. EXAM: CT ANGIOGRAPHY CHEST CT ABDOMEN AND PELVIS WITH CONTRAST TECHNIQUE: Multidetector CT imaging of the chest was performed using  the standard protocol during bolus administration of intravenous contrast. Multiplanar CT image reconstructions and MIPs were obtained to evaluate the vascular anatomy. Multidetector CT imaging of the abdomen and pelvis was performed using the standard protocol during bolus administration of intravenous contrast. RADIATION DOSE REDUCTION: This exam was performed according to the departmental dose-optimization program which includes automated exposure control, adjustment of the mA and/or kV according to patient size and/or use of iterative reconstruction technique. CONTRAST:  75mL OMNIPAQUE  IOHEXOL  350 MG/ML SOLN COMPARISON:  CT abdomen and pelvis 06/06/2023 FINDINGS: CTA CHEST FINDINGS Cardiovascular: Satisfactory opacification of the pulmonary arteries to the segmental level. No evidence of pulmonary embolism. Heart is borderline enlarged/mildly enlarged. No pericardial effusion. Mediastinum/Nodes: No enlarged mediastinal, hilar, or axillary lymph nodes. Thyroid  gland, trachea, and esophagus demonstrate no significant findings. Lungs/Pleura: There is mild atelectasis in the bilateral lower lobes and lingula. There is no pleural effusion or pneumothorax. Musculoskeletal: No chest wall abnormality. No acute or significant osseous findings. Review of the MIP images confirms the above findings. CT ABDOMEN and PELVIS FINDINGS Hepatobiliary: There is nodular liver contour, unchanged. Gallstones are present. There is no biliary ductal dilatation. Pancreas: Unremarkable. No pancreatic ductal dilatation or surrounding inflammatory changes. Spleen: Spleen is enlarged measuring up to 18 cm similar to prior. Adrenals/Urinary Tract: There are 2 subcentimeter hypodensities in the left kidney which are too small to characterize, likely cysts. Otherwise, the kidneys, adrenal glands and bladder are within normal limits. Stomach/Bowel: There is diffuse wall thickening of the ascending colon, transverse colon and descending colon.  The appendix is within normal limits. No bowel obstruction, pneumatosis or free air visualized. Stomach is within normal limits. Vascular/Lymphatic: Aorta and IVC are normal in size. Splenic varices are present. No enlarged lymph nodes are identified. Reproductive: 4.8 cm cyst with hyperdense fluid fluid level noted in the left ovary. Right ovary is within normal limits. The uterus is surgically absent. Other: There is large volume ascites. There is no focal abdominal wall hernia or free air. Musculoskeletal: No fracture is seen. Review of the MIP images confirms the above findings. IMPRESSION: 1. No evidence for pulmonary embolism. 2. Mild bibasilar atelectasis. 3. Cirrhotic liver with sequela of portal hypertension including splenomegaly, varices and large volume ascites. 4. Diffuse  wall thickening of the ascending, transverse and descending colon worrisome for colitis. 5. 4.8 cm left ovarian hemorrhagic cyst. This can be further evaluated with pelvic ultrasound. 6. Cholelithiasis. Electronically Signed   By: Greig Pique M.D.   On: 10/04/2023 21:47   DG Chest 2 View Result Date: 10/04/2023 CLINICAL DATA:  Possible sepsis EXAM: CHEST - 2 VIEW COMPARISON:  04/14/2022 FINDINGS: Cardiac shadow is within normal limits. Minimal left basilar atelectasis is noted. No bony abnormality is seen. IMPRESSION: Minimal left basilar atelectasis. Electronically Signed   By: Oneil Devonshire M.D.   On: 10/04/2023 20:47   Microbiology: Results for orders placed or performed during the hospital encounter of 10/04/23  Culture, blood (Routine x 2)     Status: None   Collection Time: 10/04/23  7:50 PM   Specimen: BLOOD LEFT ARM  Result Value Ref Range Status   Specimen Description BLOOD LEFT ARM  Final   Special Requests   Final    BOTTLES DRAWN AEROBIC AND ANAEROBIC Blood Culture adequate volume   Culture   Final    NO GROWTH 5 DAYS Performed at [facility], [address] [place], [place] [address]    Report  Status 10/09/2023 FINAL  Final  Culture, blood (Routine x 2)     Status: None   Collection Time: 10/04/23  8:01 PM   Specimen: BLOOD RIGHT ARM  Result Value Ref Range Status   Specimen Description BLOOD RIGHT ARM  Final   Special Requests   Final    BOTTLES DRAWN AEROBIC AND ANAEROBIC Blood Culture adequate volume   Culture   Final    NO GROWTH 5 DAYS Performed at [facility], [address], [place], [place] [address]    Report Status 10/09/2023 FINAL  Final  Resp panel by RT-PCR (RSV, Flu A&B, Covid) Anterior Nasal Swab     Status: None   Collection Time: 10/04/23  8:50 PM   Specimen: Anterior Nasal Swab  Result Value Ref Range Status   SARS Coronavirus 2 by RT PCR NEGATIVE NEGATIVE Final   Influenza A by PCR NEGATIVE NEGATIVE Final   Influenza B by PCR NEGATIVE NEGATIVE Final    Comment: (NOTE) The Xpert Xpress SARS-CoV-2/FLU/RSV plus assay is intended as an aid in the diagnosis of influenza from Nasopharyngeal swab specimens and should not be used as a sole basis for treatment. Nasal washings and aspirates are unacceptable for Xpert Xpress SARS-CoV-2/FLU/RSV testing.  Fact Sheet for Patients: BloggerCourse.com  Fact Sheet for Healthcare Providers: SeriousBroker.it  This test is not yet approved or cleared by the [place]  FDA and has been authorized for detection and/or diagnosis of SARS-CoV-2 by FDA under an Emergency Use Authorization (EUA). This EUA will remain in effect (meaning this test can be used) for the duration of the COVID-19 declaration under Section 564(b)(1) of the Act, 21 U.S.C. section 360bbb-3(b)(1), unless the authorization is terminated or revoked.     Resp Syncytial Virus by PCR NEGATIVE NEGATIVE Final    Comment: (NOTE) Fact Sheet for Patients: BloggerCourse.com  Fact Sheet for Healthcare Providers: SeriousBroker.it  This test is not  yet approved or cleared by the [place]  FDA and has been authorized for detection and/or diagnosis of SARS-CoV-2 by FDA under an Emergency Use Authorization (EUA). This EUA will remain in effect (meaning this test can be used) for the duration of the COVID-19 declaration under Section 564(b)(1) of the Act, 21 U.S.C. section 360bbb-3(b)(1), unless the authorization is terminated or  revoked.  Performed at [facility], [address] [place], [place] [address]   Respiratory (~20 pathogens) panel by PCR     Status: None   Collection Time: 10/04/23  8:50 PM   Specimen: Nasopharyngeal Swab; Respiratory  Result Value Ref Range Status   Adenovirus NOT DETECTED NOT DETECTED Final   Coronavirus 229E NOT DETECTED NOT DETECTED Final    Comment: (NOTE) The Coronavirus on the Respiratory Panel, DOES NOT test for the novel  Coronavirus (2019 nCoV)    Coronavirus HKU1 NOT DETECTED NOT DETECTED Final   Coronavirus NL63 NOT DETECTED NOT DETECTED Final   Coronavirus OC43 NOT DETECTED NOT DETECTED Final   Metapneumovirus NOT DETECTED NOT DETECTED Final   Rhinovirus / Enterovirus NOT DETECTED NOT DETECTED Final   Influenza A NOT DETECTED NOT DETECTED Final   Influenza B NOT DETECTED NOT DETECTED Final   Parainfluenza Virus 1 NOT DETECTED NOT DETECTED Final   Parainfluenza Virus 2 NOT DETECTED NOT DETECTED Final   Parainfluenza Virus 3 NOT DETECTED NOT DETECTED Final   Parainfluenza Virus 4 NOT DETECTED NOT DETECTED Final   Respiratory Syncytial Virus NOT DETECTED NOT DETECTED Final   Bordetella pertussis NOT DETECTED NOT DETECTED Final   Bordetella Parapertussis NOT DETECTED NOT DETECTED Final   Chlamydophila pneumoniae NOT DETECTED NOT DETECTED Final   Mycoplasma pneumoniae NOT DETECTED NOT DETECTED Final    Comment: Performed at [facility], [address], [place], [place] [address]  Gastrointestinal Panel by PCR , Stool     Status: Abnormal   Collection Time: 10/04/23 11:04  PM   Specimen: Stool  Result Value Ref Range Status   Campylobacter species DETECTED (A) NOT DETECTED Final    Comment: RESULT CALLED TO, READ BACK BY AND VERIFIED WITH:  JOYCE ATAMBILE AT 2354 10/05/23 JG    Plesimonas shigelloides NOT DETECTED NOT DETECTED Final   Salmonella species NOT DETECTED NOT DETECTED Final   Yersinia enterocolitica NOT DETECTED NOT DETECTED Final   Vibrio species NOT DETECTED NOT DETECTED Final   Vibrio cholerae NOT DETECTED NOT DETECTED Final   Enteroaggregative E coli (EAEC) NOT DETECTED NOT DETECTED Final   Enteropathogenic E coli (EPEC) NOT DETECTED NOT DETECTED Final   Enterotoxigenic E coli (ETEC) NOT DETECTED NOT DETECTED Final   Shiga like toxin producing E coli (STEC) NOT DETECTED NOT DETECTED Final   Shigella/Enteroinvasive E coli (EIEC) NOT DETECTED NOT DETECTED Final   Cryptosporidium NOT DETECTED NOT DETECTED Final   Cyclospora cayetanensis NOT DETECTED NOT DETECTED Final   Entamoeba histolytica NOT DETECTED NOT DETECTED Final   Giardia lamblia NOT DETECTED NOT DETECTED Final   Adenovirus F40/41 NOT DETECTED NOT DETECTED Final   Astrovirus NOT DETECTED NOT DETECTED Final   Norovirus GI/GII NOT DETECTED NOT DETECTED Final   Rotavirus A NOT DETECTED NOT DETECTED Final   Sapovirus (I, II, IV, and V) NOT DETECTED NOT DETECTED Final    Comment: Performed at [facility], [address] [place], [place] [address]  C Difficile Quick Screen w PCR reflex     Status: Abnormal   Collection Time: 10/04/23 11:04 PM   Specimen: STOOL  Result Value Ref Range Status   C Diff antigen POSITIVE (A) NEGATIVE Final   C Diff toxin NEGATIVE NEGATIVE Final   C Diff interpretation Results are indeterminate. See PCR results.  Final    Comment: Performed at [facility], [address], [place], [place] [address]  C. Diff by PCR,  Reflexed     Status: Abnormal   Collection Time: 10/04/23 11:04 PM  Result Value Ref Range Status   Toxigenic C. Difficile  by PCR POSITIVE (A) NEGATIVE Final    Comment: Positive for toxigenic C. difficile with little to no toxin production. Only treat if clinical presentation suggests symptomatic illness. Performed at [facility], [address], [place], [place] [address]   Culture, body fluid w Gram Stain-bottle     Status: None (Preliminary result)   Collection Time: 10/05/23  1:00 PM   Specimen: Path fluid  Result Value Ref Range Status   Specimen Description FLUID  Final   Special Requests NONE  Final   Culture   Final    NO GROWTH 4 DAYS Performed at [facility], [address] [place], [place] [address]    Report Status PENDING  Incomplete  Gram stain     Status: None   Collection Time: 10/05/23  1:13 PM   Specimen: Abdomen; Peritoneal Fluid  Result Value Ref Range Status   Specimen Description ABDOMEN  Final   Special Requests NONE  Final   Gram Stain   Final    NO WBC SEEN NO ORGANISMS SEEN Performed at [facility], [address], [place], [place] [address]    Report Status 10/05/2023 FINAL  Final   Labs: CBC: Recent Labs  Lab 10/04/23 2002 10/04/23 2208 10/05/23 0459 10/06/23 0552 10/07/23 0717  WBC 4.4  --  2.9* 4.6 3.1*  NEUTROABS 3.5  --   --  3.6 2.0  HGB 10.7* 9.2* 9.7* 9.8* 10.4*  HCT 31.3* 27.0* 28.7* 28.0* 30.0*  MCV 93.4  --  95.7 94.6 95.2  PLT 69*  --  49* 58* 66*   Basic Metabolic Panel: Recent Labs  Lab 10/04/23 2002 10/04/23 2208 10/05/23 0459 10/06/23 0552 10/07/23 0717  NA 135 137 132* 134* 133*  K 4.0 3.9 4.4 3.1* 3.6  CL 107  --  106 106 105  CO2 20*  --  20* 23 21*  GLUCOSE 213*  --  420* 266* 226*  BUN 11  --  12 12 7   CREATININE 0.89  --  1.00 0.71 0.71  CALCIUM 8.2*  --  8.3* 8.2* 8.1*  MG  --   --   --  1.8 1.8  PHOS  --   --   --  2.9 2.9   Liver Function Tests: Recent Labs  Lab 10/04/23 2002 10/05/23 0459 10/06/23 0552 10/07/23 0717  AST 58* 54* 41 60*  ALT 31 30 27  34  ALKPHOS 85 76 89 84  BILITOT 2.7*  3.6* 2.2* 2.0*  PROT 6.4* 6.8 6.6 6.4*  ALBUMIN  2.4* 3.1* 2.8* 2.8*   CBG: Recent Labs  Lab 10/06/23 1703 10/06/23 2101 10/07/23 0355 10/07/23 0822 10/07/23 1204  GLUCAP 157* 161* 113* 279* 329*   Discharge time spent: greater than 30 minutes.  Signed: Alejandro Marker, DO [facility] 10/09/2023

## 2023-10-10 ENCOUNTER — Other Ambulatory Visit: Payer: Self-pay

## 2023-10-10 ENCOUNTER — Emergency Department (HOSPITAL_COMMUNITY)

## 2023-10-10 ENCOUNTER — Encounter (HOSPITAL_COMMUNITY): Payer: Self-pay | Admitting: *Deleted

## 2023-10-10 ENCOUNTER — Telehealth: Payer: Self-pay

## 2023-10-10 ENCOUNTER — Observation Stay (HOSPITAL_COMMUNITY)
Admission: EM | Admit: 2023-10-10 | Discharge: 2023-10-11 | Disposition: A | Discharge status: Home / Self Care | Attending: Internal Medicine | Admitting: Internal Medicine

## 2023-10-10 DIAGNOSIS — K7581 Nonalcoholic steatohepatitis (NASH): Secondary | ICD-10-CM | POA: Diagnosis not present

## 2023-10-10 DIAGNOSIS — Z8616 Personal history of COVID-19: Secondary | ICD-10-CM | POA: Diagnosis not present

## 2023-10-10 DIAGNOSIS — Z8679 Personal history of other diseases of the circulatory system: Secondary | ICD-10-CM | POA: Insufficient documentation

## 2023-10-10 DIAGNOSIS — Z2989 Encounter for other specified prophylactic measures: Secondary | ICD-10-CM | POA: Insufficient documentation

## 2023-10-10 DIAGNOSIS — R188 Other ascites: Secondary | ICD-10-CM | POA: Insufficient documentation

## 2023-10-10 DIAGNOSIS — E8770 Fluid overload, unspecified: Secondary | ICD-10-CM | POA: Diagnosis not present

## 2023-10-10 DIAGNOSIS — G43909 Migraine, unspecified, not intractable, without status migrainosus: Secondary | ICD-10-CM | POA: Diagnosis present

## 2023-10-10 DIAGNOSIS — Z91040 Latex allergy status: Secondary | ICD-10-CM | POA: Diagnosis not present

## 2023-10-10 DIAGNOSIS — K219 Gastro-esophageal reflux disease without esophagitis: Secondary | ICD-10-CM | POA: Diagnosis present

## 2023-10-10 DIAGNOSIS — E1140 Type 2 diabetes mellitus with diabetic neuropathy, unspecified: Secondary | ICD-10-CM | POA: Diagnosis not present

## 2023-10-10 DIAGNOSIS — Z794 Long term (current) use of insulin: Secondary | ICD-10-CM | POA: Diagnosis not present

## 2023-10-10 DIAGNOSIS — K08409 Partial loss of teeth, unspecified cause, unspecified class: Secondary | ICD-10-CM | POA: Insufficient documentation

## 2023-10-10 DIAGNOSIS — R770 Abnormality of albumin: Secondary | ICD-10-CM | POA: Diagnosis not present

## 2023-10-10 DIAGNOSIS — E876 Hypokalemia: Secondary | ICD-10-CM | POA: Diagnosis not present

## 2023-10-10 DIAGNOSIS — K3189 Other diseases of stomach and duodenum: Secondary | ICD-10-CM | POA: Insufficient documentation

## 2023-10-10 DIAGNOSIS — E66812 Obesity, class 2: Secondary | ICD-10-CM | POA: Insufficient documentation

## 2023-10-10 DIAGNOSIS — Z8719 Personal history of other diseases of the digestive system: Secondary | ICD-10-CM | POA: Diagnosis not present

## 2023-10-10 DIAGNOSIS — Z7984 Long term (current) use of oral hypoglycemic drugs: Secondary | ICD-10-CM | POA: Diagnosis not present

## 2023-10-10 DIAGNOSIS — R109 Unspecified abdominal pain: Secondary | ICD-10-CM | POA: Insufficient documentation

## 2023-10-10 DIAGNOSIS — J45909 Unspecified asthma, uncomplicated: Secondary | ICD-10-CM | POA: Insufficient documentation

## 2023-10-10 DIAGNOSIS — N83202 Unspecified ovarian cyst, left side: Secondary | ICD-10-CM | POA: Diagnosis not present

## 2023-10-10 DIAGNOSIS — F32A Depression, unspecified: Secondary | ICD-10-CM | POA: Diagnosis not present

## 2023-10-10 DIAGNOSIS — K7460 Unspecified cirrhosis of liver: Secondary | ICD-10-CM | POA: Diagnosis not present

## 2023-10-10 DIAGNOSIS — A045 Campylobacter enteritis: Secondary | ICD-10-CM | POA: Insufficient documentation

## 2023-10-10 DIAGNOSIS — R0602 Shortness of breath: Secondary | ICD-10-CM | POA: Diagnosis present

## 2023-10-10 DIAGNOSIS — F419 Anxiety disorder, unspecified: Secondary | ICD-10-CM | POA: Insufficient documentation

## 2023-10-10 DIAGNOSIS — F418 Other specified anxiety disorders: Secondary | ICD-10-CM | POA: Diagnosis present

## 2023-10-10 DIAGNOSIS — F411 Generalized anxiety disorder: Secondary | ICD-10-CM | POA: Insufficient documentation

## 2023-10-10 DIAGNOSIS — E119 Type 2 diabetes mellitus without complications: Secondary | ICD-10-CM

## 2023-10-10 LAB — COMPREHENSIVE METABOLIC PANEL WITH GFR
ALT: 31 U/L (ref 0–44)
AST: 49 U/L — ABNORMAL HIGH (ref 15–41)
Albumin: 2.5 g/dL — ABNORMAL LOW (ref 3.5–5.0)
Alkaline Phosphatase: 95 U/L (ref 38–126)
Anion gap: 9 (ref 5–15)
BUN: 7 mg/dL (ref 6–20)
CO2: 22 mmol/L (ref 22–32)
Calcium: 8 mg/dL — ABNORMAL LOW (ref 8.9–10.3)
Chloride: 100 mmol/L (ref 98–111)
Creatinine, Ser: 0.73 mg/dL (ref 0.44–1.00)
GFR, Estimated: >60 mL/min (ref >60)
Glucose, Bld: 398 mg/dL — ABNORMAL HIGH (ref 70–99)
Potassium: 3.2 mmol/L — ABNORMAL LOW (ref 3.5–5.1)
Sodium: 131 mmol/L — ABNORMAL LOW (ref 135–145)
Total Bilirubin: 2.6 mg/dL — ABNORMAL HIGH (ref 0.0–1.2)
Total Protein: 6.1 g/dL — ABNORMAL LOW (ref 6.5–8.1)

## 2023-10-10 LAB — CBC
HCT: 31.1 % — ABNORMAL LOW (ref 36.0–46.0)
HCT: 33.1 % — ABNORMAL LOW (ref 36.0–46.0)
Hemoglobin: 10.5 g/dL — ABNORMAL LOW (ref 12.0–15.0)
Hemoglobin: 11.5 g/dL — ABNORMAL LOW (ref 12.0–15.0)
MCH: 32.6 pg (ref 26.0–34.0)
MCH: 33.4 pg (ref 26.0–34.0)
MCHC: 33.8 g/dL (ref 30.0–36.0)
MCHC: 34.7 g/dL (ref 30.0–36.0)
MCV: 96.6 fL (ref 80.0–100.0)
MCV: 96.2 fL (ref 80.0–100.0)
Platelets: 71 10*3/uL — ABNORMAL LOW (ref 150–400)
Platelets: 70 10*3/uL — ABNORMAL LOW (ref 150–400)
RBC: 3.22 MIL/uL — ABNORMAL LOW (ref 3.87–5.11)
RBC: 3.44 MIL/uL — ABNORMAL LOW (ref 3.87–5.11)
RDW: 15.8 % — ABNORMAL HIGH (ref 11.5–15.5)
RDW: 15.8 % — ABNORMAL HIGH (ref 11.5–15.5)
WBC: 2.7 10*3/uL — ABNORMAL LOW (ref 4.0–10.5)
WBC: 2.7 10*3/uL — ABNORMAL LOW (ref 4.0–10.5)
nRBC: 0 % (ref 0.0–0.2)
nRBC: 0.8 % — ABNORMAL HIGH (ref 0.0–0.2)

## 2023-10-10 LAB — URINALYSIS, ROUTINE W REFLEX MICROSCOPIC
Bilirubin Urine: NEGATIVE
Glucose, UA: >=500 mg/dL — AB
Hgb urine dipstick: NEGATIVE
Ketones, ur: NEGATIVE mg/dL
Leukocytes,Ua: NEGATIVE
Nitrite: NEGATIVE
Protein, ur: NEGATIVE mg/dL
Specific Gravity, Urine: 1.011 (ref 1.005–1.030)
pH: 5 (ref 5.0–8.0)

## 2023-10-10 LAB — DIFFERENTIAL
Abs Immature Granulocytes: 0 10*3/uL (ref 0.00–0.07)
Basophils Absolute: 0.1 10*3/uL (ref 0.0–0.1)
Basophils Relative: 2 %
Eosinophils Absolute: 0 10*3/uL (ref 0.0–0.5)
Eosinophils Relative: 1 %
Lymphocytes Relative: 12 %
Lymphs Abs: 0.3 10*3/uL — ABNORMAL LOW (ref 0.7–4.0)
Monocytes Absolute: 0.2 10*3/uL (ref 0.1–1.0)
Monocytes Relative: 7 %
Neutro Abs: 2.1 10*3/uL (ref 1.7–7.7)
Neutrophils Relative %: 78 %
nRBC: 0 /100{WBCs}

## 2023-10-10 LAB — CULTURE, BODY FLUID W GRAM STAIN -BOTTLE

## 2023-10-10 LAB — PROTIME-INR
INR: 1.5 — ABNORMAL HIGH (ref 0.8–1.2)
Prothrombin Time: 18.7 s — ABNORMAL HIGH (ref 11.4–15.2)

## 2023-10-10 LAB — LIPASE, BLOOD: Lipase: 59 U/L — ABNORMAL HIGH (ref 11–51)

## 2023-10-10 LAB — AMMONIA: Ammonia: 42 umol/L — ABNORMAL HIGH (ref 9–35)

## 2023-10-10 MED ORDER — INSULIN ASPART 100 UNIT/ML IJ SOLN
0.0000 [IU] | Freq: Every day | INTRAMUSCULAR | Status: DC
  Administered 2023-10-10: 2 [IU] via SUBCUTANEOUS

## 2023-10-10 MED ORDER — ONDANSETRON HCL 4 MG/2ML IJ SOLN: 4.0000 mg | Freq: Four times a day (QID) | INTRAMUSCULAR | Status: DC | PRN

## 2023-10-10 MED ORDER — ONDANSETRON HCL 4 MG PO TABS
4.0000 mg | ORAL_TABLET | Freq: Four times a day (QID) | ORAL | Status: DC | PRN
Start: 2023-10-10 — End: 2023-10-11

## 2023-10-10 MED ORDER — INSULIN ASPART 100 UNIT/ML IJ SOLN
0.0000 [IU] | Freq: Three times a day (TID) | INTRAMUSCULAR | Status: DC
  Administered 2023-10-11: 3 [IU] via SUBCUTANEOUS

## 2023-10-10 NOTE — ED Notes (Signed)
 Called floor twice, no one answered.[phone]

## 2023-10-10 NOTE — ED Notes (Signed)
 Pt ambulatory to restroom

## 2023-10-10 NOTE — ED Notes (Signed)
 Called to alert floor that PT is on the way up, no answer. Transport has been contacted, he is currently with a PT.

## 2023-10-10 NOTE — H&P (Signed)
 History and Physical    Patient: Laurie Sutton FMW:[id number] DOB: Mar 24, 1981 DOA: 10/10/2023 DOS: the patient was seen and examined on 10/10/2023 PCP: [name], MD  Patient coming from: Home  Chief Complaint:  Chief Complaint  Patient presents with   Shortness of Breath   Abdominal Pain    Pt is here with sob and abdominal swelling.   HPI: Laurie Sutton is a 43 y.o. female with medical history significant of migraine headaches, GERD, depression with anxiety, type 2 diabetes, history of liver cirrhosis secondary to [facility], history of frequent UTIs, history of portal hypertension and esophageal varices who presented to the ER with abdominal pain and distention.  Patient is having significant pain.  She is seen and evaluated.  Appears to have significant ascites which is tensed.  Patient will require paracentesis.  IR consulted and will have paracentesis in the morning.  Patient be admitted to the medical service.  Review of Systems: As mentioned in the history of present illness. All other systems reviewed and are negative. Past Medical History:  Diagnosis Date   ADHD    Allergy    Anemia    IRON TRANSFUSION 07-2020 NONE SINCE   Anxiety    Back pain    COVID 08/12/2019   ALL SYMPTOMS REOLVED PER PT   Depression    Diabetic neuropathy (HCC) 11/11/2020   FEET   dm type 2    sees Dr. Odella Jacobson at [facility]   Fatty liver    GERD (gastroesophageal reflux disease)    History of kidney stones    Joint pain    Menorrhagia 11/11/2020   Migraines    Murmur, cardiac    FAINT NO CARDIOLOGIST   Other fatigue    Pneumonia 08/12/2019   COVID PNEUMONIA ALL SYMPTOMS RESOLVED PER PT   Shortness of breath on exertion    Past Surgical History:  Procedure Laterality Date   CESAREAN SECTION  12/13/2006   COLONOSCOPY WITH PROPOFOL  N/A 08/02/2023   Procedure: COLONOSCOPY WITH PROPOFOL ;  Surgeon: Legrand Victory LITTIE DOUGLAS, MD;  Location: [facility];  Service:  Gastroenterology;  Laterality: N/A;   ESOPHAGOGASTRODUODENOSCOPY (EGD) WITH PROPOFOL  N/A 08/02/2023   Procedure: ESOPHAGOGASTRODUODENOSCOPY (EGD) WITH PROPOFOL ;  Surgeon: Legrand Victory LITTIE DOUGLAS, MD;  Location: [facility];  Service: Gastroenterology;  Laterality: N/A;   EXTRACORPOREAL SHOCK WAVE LITHOTRIPSY  2018   FOOT SURGERY Right 10/02/2020   RIGHT FOOT HEEL AND TOES, [facility] OFF [address]   IR PARACENTESIS  07/06/2023   IR PARACENTESIS  08/18/2023   IR PARACENTESIS  10/05/2023   IR TRANSCATHETER BX  07/26/2023   IR US  GUIDE VASC ACCESS RIGHT  07/26/2023   IR VENOGRAM HEPATIC W HEMODYNAMIC EVALUATION  07/26/2023   LAPAROSCOPIC VAGINAL HYSTERECTOMY WITH SALPINGECTOMY Bilateral 11/17/2020   Procedure: LAPAROSCOPIC ASSISTED VAGINAL HYSTERECTOMY WITH BILATERAL SALPINGECTOMY;  Surgeon: Dannielle Bouchard, DO;  Location: [facility];  Service: Gynecology;  Laterality: Bilateral;   POLYPECTOMY  08/02/2023   Procedure: POLYPECTOMY, INTESTINE;  Surgeon: Legrand Victory LITTIE DOUGLAS, MD;  Location: [facility];  Service: Gastroenterology;;   Social History:  reports that she has never smoked. She has been exposed to tobacco smoke. She has never used smokeless tobacco. She reports that she does not drink alcohol and does not use drugs.  Allergies  Allergen Reactions   Vancomycin  Itching   Amoxicillin Hives and Itching   Azithromycin      DOES NOT WORK   Dulaglutide  Other (See Comments)  constipation and stomach issues  **Trulicity **    Empagliflozin -Metformin  Hcl Er Other (See Comments) and Swelling   Januvia  [Sitagliptin ] Other (See Comments)    HURTS STOAMCH   Latex     IRRITATES VAGINAL AREA   Metformin      Other Reaction(s): achy all over   Penicillins Hives    Has patient had a PCN reaction causing immediate rash, facial/tongue/throat swelling, SOB or lightheadedness with hypotension: yes. Rash  Has patient had a PCN reaction causing severe rash involving mucus membranes or skin  necrosis: Yes- rash and hives all over body  Has patient had a PCN reaction that required hospitalization No Has patient had a PCN reaction occurring within the last 10 years: Yes  If all of the above answers are NO, then may proceed with Cephalosporin use.     Family History  Problem Relation Age of Onset   Colon polyps Mother    Depression Mother    Anxiety disorder Mother    Colon polyps Father    Sleep apnea Father    Anxiety disorder Father    Depression Father    Diabetes Father    Bladder Cancer Father 9   Rheum arthritis Father    Migraines Maternal Aunt    Migraines Maternal Grandmother    Diabetes Maternal Grandfather    Healthy Daughter    Asthma Daughter    Anxiety disorder Daughter    Anxiety disorder Son    Asthma Son    Healthy Son    Cerebral aneurysm Cousin    Heart disease Other    Depression Other    Anxiety disorder Other    Sleep apnea Other    Colon cancer Neg Hx    Esophageal cancer Neg Hx    Stomach cancer Neg Hx    Rectal cancer Neg Hx     Prior to Admission medications   Medication Sig Start Date End Date Taking? Authorizing Provider  acetaminophen  (TYLENOL ) 325 MG tablet Take 2 tablets (650 mg total) by mouth every 6 (six) hours as needed for mild pain (pain score 1-3) or fever (or Fever >/= 101). 10/07/23   Sherrill Cable Latif, DO  albuterol  (VENTOLIN  HFA) 108 [phone] Base) MCG/ACT inhaler INHALE 2 PUFFS INTO THE LUNGS UP TO EVERY 6 HOURS AS NEEDED FOR WHEEZING/SHORTNESS OF BREATH Patient not taking: Reported on 10/04/2023 08/20/22   [name], MD  clotrimazole -betamethasone  (LOTRISONE ) cream APPLY 1 APPLICATION TOPICALLY TWICE A DAY AS NEEDED 05/26/23   [name], MD  Continuous Glucose Sensor (DEXCOM G7 SENSOR) MISC Use 1 sensor for continuous glucose monitoring every 10 days for 30 days 05/25/23   Braulio Hough, MD  fidaxomicin  (DIFICID ) 200 MG TABS tablet Take 1 tablet (200 mg total) by mouth 2 (two) times daily for 8 days. 10/07/23  10/15/23  Sherrill Cable Latif, DO  FLUoxetine  (PROZAC ) 40 MG capsule TAKE 1 CAPSULE BY MOUTH TWICE A DAY 07/26/23   [name], MD  furosemide  (LASIX ) 20 MG tablet Take 60 mg by mouth daily. 06/29/23 06/28/24  [provider]  HUMULIN  R U-500 KWIKPEN 500 UNIT/ML KwikPen Use 120 units at breakfast, 100 at lunch, and 100 at dinner Patient taking differently: Use 60 units at breakfast, 65 at lunch, and 65 at dinner 04/08/22   [name], MD  lactulose  (CHRONULAC ) 10 GM/15ML solution Take 30 mLs (20 g total) by mouth 2 (two) times daily. 10/07/23   Sherrill Cable Latif, DO  montelukast  (SINGULAIR ) 10 MG tablet  Take 1 tablet (10 mg total) by mouth at bedtime. 08/25/22   [name], MD  mupirocin  ointment (BACTROBAN ) 2 % Apply 1 Application topically 2 (two) times daily. 06/28/23   Janit Thresa HERO, DPM  omeprazole  (PRILOSEC) 40 MG capsule TAKE 1 CAPSULE (40 MG TOTAL) BY MOUTH IN THE MORNING 07/25/23   Legrand Victory LITTIE DOUGLAS, MD  ondansetron  (ZOFRAN -ODT) 4 MG disintegrating tablet Take 1 tablet (4 mg total) by mouth every 8 (eight) hours as needed for nausea or vomiting. 02/09/23   Becki Krabbe, FNP  propranolol  (INDERAL ) 60 MG tablet Take 1 tablet (60 mg total) by mouth 2 (two) times daily. 02/23/23   Gayland Lauraine PARAS, NP  spironolactone  (ALDACTONE ) 50 MG tablet Take 150 mg by mouth daily. 08/23/23   [provider]  tirzepatide  (MOUNJARO ) 7.5 MG/0.5ML Pen Inject 7.5 mg Subcutaneous once a week 30 days 07/06/23       Physical Exam: Vitals:   10/10/23 1745 10/10/23 1800 10/10/23 1815 10/10/23 1830  BP: (!) 104/54 (!) 103/59 (!) 93/45 105/60  Pulse: 66 70 69 68  Resp: 19 (!) 22 (!) 24 (!) 23  Temp:      TempSrc:      SpO2: 100% 100% 99% 98%   Constitutional: Chronically ill looking, NAD, calm, comfortable Eyes: PERRL, lids and conjunctivae normal ENMT: Mucous membranes are moist. Posterior pharynx clear of any exudate or lesions.Normal dentition.  Neck: normal, supple, no  masses, no thyromegaly Respiratory: clear to auscultation bilaterally, no wheezing, no crackles. Normal respiratory effort. No accessory muscle use.  Cardiovascular: Regular rate and rhythm, no murmurs / rubs / gallops. No extremity edema. 2+ pedal pulses. No carotid bruits.  Abdomen: Distended tense with shifting dullness indicating tense ascites no tenderness, no masses palpated. No hepatosplenomegaly. Bowel sounds positive.  Musculoskeletal: Good range of motion, no joint swelling or tenderness, Skin: no rashes, lesions, ulcers. No induration Neurologic: CN 2-12 grossly intact. Sensation intact, DTR normal. Strength 5/5 in all 4.  Psychiatric: Normal judgment and insight. Alert and oriented x 3. Normal mood  Data Reviewed:  Heart rate is 24, blood pressure 109/54 white count 2.7 hemoglobin 11.5 platelets 70, sodium 130 potassium 3.2 chloride 100 CO2 22 BUN 7 creatinine 0.70 calcium 8.0 INR 1.5 glucose 398,Chest x-ray showed no active disease  Assessment and Plan:  #1 NASH cirrhosis with ascites: Patient will be admitted.  Paracentesis will be performed.  Continue other supportive care.  #2 type 2 diabetes: Continue with sliding scale insulin .  #3 depression with anxiety: Continue with home regimen  #4 GERD: Continues PPI  #5 history of migraine headaches: Continue treatment from home    Advance Care Planning:   Code Status: Full Code   Consults: Interventional radiology for paracentesis  Family Communication: No family at bedside  Severity of Illness: The appropriate patient status for this patient is OBSERVATION. Observation status is judged to be reasonable and necessary in order to provide the required intensity of service to ensure the patient's safety. The patient's presenting symptoms, physical exam findings, and initial radiographic and laboratory data in the context of their medical condition is felt to place them at decreased risk for further clinical deterioration.  Furthermore, it is anticipated that the patient will be medically stable for discharge from the hospital within 2 midnights of admission.   AuthorBETHA SIM KNOLL, MD 10/10/2023 7:49 PM  For on call review [email or web address].

## 2023-10-10 NOTE — ED Notes (Signed)
 Admitting MD at bedside.

## 2023-10-10 NOTE — Transitions of Care (Post Inpatient/ED Visit) (Signed)
   10/10/2023  Name: Laurie Sutton MRN: [id number] DOB: [birth year]  Today's TOC FU Call Status: Today's TOC FU Call Status:: Unsuccessful Call (1st Attempt) Unsuccessful Call (1st Attempt) Date: 10/10/23  Attempted to reach the patient regarding the most recent Inpatient/ED visit.  Follow Up Plan: Additional outreach attempts will be made to reach the patient to complete the Transitions of Care (Post Inpatient/ED visit) call.   Alan Ee, RN, BSN, CEN [employer]- Transition of Care Team.  [facility] [phone]

## 2023-10-10 NOTE — ED Provider Notes (Signed)
 [place] EMERGENCY DEPARTMENT AT [facility] Provider Note   CSN: [id number] Arrival date & time: 10/10/23  1255     Patient presents with: Shortness of Breath and Abdominal Pain (Pt is here with sob and abdominal swelling.)   Laurie Sutton is a 43 y.o. female.  With a history of [name] and abdominal ascites who presents to the ED for pain.  Recently seen in the ED on June 24 for abdominal swelling.  Was subsequently admitted and had 3 L taken off of IR paracentesis.  Discharged on June 27.  She is back today with more abdominal swelling discomfort.  Some associated shortness of breath.  No fevers chills vomiting or diarrhea    Shortness of Breath Associated symptoms: abdominal pain   Abdominal Pain Associated symptoms: shortness of breath        Prior to Admission medications   Medication Sig Start Date End Date Taking? Authorizing Provider  acetaminophen  (TYLENOL ) 325 MG tablet Take 2 tablets (650 mg total) by mouth every 6 (six) hours as needed for mild pain (pain score 1-3) or fever (or Fever >/= 101). 10/07/23   Sherrill Cable Latif, DO  albuterol  (VENTOLIN  HFA) 108 [phone] Base) MCG/ACT inhaler INHALE 2 PUFFS INTO THE LUNGS UP TO EVERY 6 HOURS AS NEEDED FOR WHEEZING/SHORTNESS OF BREATH Patient not taking: Reported on 10/04/2023 08/20/22   [name], MD  clotrimazole -betamethasone  (LOTRISONE ) cream APPLY 1 APPLICATION TOPICALLY TWICE A DAY AS NEEDED 05/26/23   [name], MD  Continuous Glucose Sensor (DEXCOM G7 SENSOR) MISC Use 1 sensor for continuous glucose monitoring every 10 days for 30 days 05/25/23   Braulio Hough, MD  fidaxomicin  (DIFICID ) 200 MG TABS tablet Take 1 tablet (200 mg total) by mouth 2 (two) times daily for 8 days. 10/07/23 10/15/23  Sherrill Cable Latif, DO  FLUoxetine  (PROZAC ) 40 MG capsule TAKE 1 CAPSULE BY MOUTH TWICE A DAY 07/26/23   [name], MD  furosemide  (LASIX ) 20 MG tablet Take 60 mg by mouth daily. 06/29/23 06/28/24  [provider]  HUMULIN  R U-500 KWIKPEN 500 UNIT/ML KwikPen Use 120 units at breakfast, 100 at lunch, and 100 at dinner Patient taking differently: Use 60 units at breakfast, 65 at lunch, and 65 at dinner 04/08/22   [name], MD  lactulose  (CHRONULAC ) 10 GM/15ML solution Take 30 mLs (20 g total) by mouth 2 (two) times daily. 10/07/23   Sherrill Cable Latif, DO  montelukast  (SINGULAIR ) 10 MG tablet Take 1 tablet (10 mg total) by mouth at bedtime. 08/25/22   [name], MD  mupirocin  ointment (BACTROBAN ) 2 % Apply 1 Application topically 2 (two) times daily. 06/28/23   Janit Thresa HERO, DPM  omeprazole  (PRILOSEC) 40 MG capsule TAKE 1 CAPSULE (40 MG TOTAL) BY MOUTH IN THE MORNING 07/25/23   Legrand Victory LITTIE DOUGLAS, MD  ondansetron  (ZOFRAN -ODT) 4 MG disintegrating tablet Take 1 tablet (4 mg total) by mouth every 8 (eight) hours as needed for nausea or vomiting. 02/09/23   Becki Krabbe, FNP  propranolol  (INDERAL ) 60 MG tablet Take 1 tablet (60 mg total) by mouth 2 (two) times daily. 02/23/23   Gayland Lauraine PARAS, NP  spironolactone  (ALDACTONE ) 50 MG tablet Take 150 mg by mouth daily. 08/23/23   [provider]  tirzepatide  (MOUNJARO ) 7.5 MG/0.5ML Pen Inject 7.5 mg Subcutaneous once a week 30 days 07/06/23       Allergies: Vancomycin , Amoxicillin, Azithromycin , Dulaglutide , Empagliflozin -metformin  hcl er, Januvia  [sitagliptin ], Latex,  Metformin , and Penicillins    Review of Systems  Respiratory:  Positive for shortness of breath.   Gastrointestinal:  Positive for abdominal pain.    Updated Vital Signs BP 105/60   Pulse 68   Temp 98.1 F (36.7 C) (Oral)   Resp (!) 23   LMP 11/13/2020 (Exact Date)   SpO2 98%   Physical Exam Vitals and nursing note reviewed.  HENT:     Head: Normocephalic and atraumatic.   Eyes:     Pupils: Pupils are equal, round, and reactive to light.    Cardiovascular:     Rate and Rhythm: Normal rate and regular rhythm.  Pulmonary:     Effort: Pulmonary  effort is normal.     Breath sounds: Normal breath sounds.  Abdominal:     Tenderness: There is no abdominal tenderness.     Comments: Distention with fluid wave   Skin:    General: Skin is warm and dry.   Neurological:     Mental Status: She is alert.   Psychiatric:        Mood and Affect: Mood normal.     (all labs ordered are listed, but only abnormal results are displayed) Labs Reviewed  LIPASE, BLOOD - Abnormal; Notable for the following components:      Result Value   Lipase 59 (*)    All other components within normal limits  COMPREHENSIVE METABOLIC PANEL WITH GFR - Abnormal; Notable for the following components:   Sodium 131 (*)    Potassium 3.2 (*)    Glucose, Bld 398 (*)    Calcium 8.0 (*)    Total Protein 6.1 (*)    Albumin  2.5 (*)    AST 49 (*)    Total Bilirubin 2.6 (*)    All other components within normal limits  CBC - Abnormal; Notable for the following components:   WBC 2.7 (*)    RBC 3.22 (*)    Hemoglobin 10.5 (*)    HCT 31.1 (*)    RDW 15.8 (*)    Platelets 71 (*)    All other components within normal limits  URINALYSIS, ROUTINE W REFLEX MICROSCOPIC - Abnormal; Notable for the following components:   APPearance HAZY (*)    Glucose, UA >=500 (*)    Bacteria, UA RARE (*)    All other components within normal limits  DIFFERENTIAL - Abnormal; Notable for the following components:   Lymphs Abs 0.3 (*)    All other components within normal limits  AMMONIA - Abnormal; Notable for the following components:   Ammonia 42 (*)    All other components within normal limits  PROTIME-INR - Abnormal; Notable for the following components:   Prothrombin Time 18.7 (*)    INR 1.5 (*)    All other components within normal limits  CBC - Abnormal; Notable for the following components:   WBC 2.7 (*)    RBC 3.44 (*)    Hemoglobin 11.5 (*)    HCT 33.1 (*)    RDW 15.8 (*)    Platelets 70 (*)    nRBC 0.8 (*)    All other components within normal limits     EKG: EKG Interpretation Date/Time:  Monday October 10 2023 14:42:21 EDT Ventricular Rate:  71 PR Interval:  130 QRS Duration:  92 QT Interval:  446 QTC Calculation: 484 R Axis:   -20  Text Interpretation: Normal sinus rhythm Moderate voltage criteria for LVH, may be normal variant ( R in aVL ,  Cornell product ) Nonspecific T wave abnormality Prolonged QT Abnormal ECG When compared with ECG of 04-Oct-2023 19:43, PREVIOUS ECG IS PRESENT No significant change since last tracing Confirmed by Zackowski, Scott [phone]) on 10/10/2023 3:41:41 PM  Radiology: [facility] Chest 2 View Result Date: 10/10/2023 CLINICAL DATA:  Shortness of breath. EXAM: CHEST - 2 VIEW COMPARISON:  10/06/2023 FINDINGS: Low volume film. The lungs are clear without focal pneumonia, edema, pneumothorax or pleural effusion. Cardiopericardial silhouette is at upper limits of normal for size. No acute bony abnormality. IMPRESSION: No active cardiopulmonary disease. Electronically Signed   By: Camellia Candle M.D.   On: 10/10/2023 13:59     Procedures   Medications Ordered in the ED - No data to display  Clinical Course as of 10/10/23 1844  Mon Oct 10, 2023  1843 Laboratory workup appears to be around the patient's baseline.  We are unable to get her ultrasound paracentesis at this time.  She will be admitted with plan for IR paracentesis in the morning.  Discussed with admitting hospitalist accepts patient for admission [MP]    Clinical Course User Index [MP] Pamella Ozell LABOR, DO                                 Medical Decision Making 43 year old female with history as above returns for abdominal distention and discomfort.  Recently seen here for ascites in the setting of Nash cirrhosis.  Subsequently admitted IR drainage.  Laboratory workup looks similar to previous.  She will need another paracentesis.  Low suspicion for encephalopathy or spontaneous bacterial peritonitis at this time but will order fluid studies from  paracentesis  Amount and/or Complexity of Data Reviewed Labs: ordered. Radiology: ordered.  Risk Decision regarding hospitalization.        Final diagnoses:  Liver cirrhosis secondary to NASH [facility])  Other ascites  Abdominal pain, unspecified abdominal location    ED Discharge Orders     None          Pamella Ozell LABOR, DO 10/10/23 1844

## 2023-10-10 NOTE — ED Provider Triage Note (Signed)
 Emergency Medicine Provider Triage Evaluation Note  Laurie Sutton , a 43 y.o. female  was evaluated in triage.  Pt complains of abdominal pain feels like is full of fluid.  Patient was admitted June 24 through June 27 for shortness of breath colitis hepatic encephalopathy.  And hypoxia and elevated ammonia level..  Review of Systems  Positive: Abdominal pain Negative: Fever  Physical Exam  BP (!) 92/49   Pulse 72   Temp 97.7 F (36.5 C)   Resp (!) 22   LMP 11/13/2020 (Exact Date)   SpO2 100%  Gen: Awake, uncomfortable Resp: Normal effort clear to auscultation bilaterally. Abdomen: Distended diffusely tender no guarding mild tenderness MSK: Moves extremities without difficulty  Other:   Medical Decision Making  Medically screening exam initiated at 3:44 PM.  Appropriate orders placed.  Laurie Sutton was informed that the remainder of the evaluation will be completed by another provider, this initial triage assessment does not replace that evaluation, and the importance of remaining in the ED until their evaluation is complete.  Patient with the last admission at 3 L of ascites fluid withdrawn.  She feels like it is all accumulated again.  This time she has some tenderness.  Raising some concerns about spontaneous bacterial peritonitis.  Labs ordered.  Was going to order CT scan but they took her back to her room.   [name], MD 10/10/23 [phone]

## 2023-10-10 NOTE — ED Triage Notes (Signed)
 Pt is here with sob, swelling to abdomen, and was diagnosed with c-diff and they drew 3 liters off of abdomen

## 2023-10-11 ENCOUNTER — Other Ambulatory Visit: Payer: Self-pay

## 2023-10-11 ENCOUNTER — Observation Stay (HOSPITAL_COMMUNITY)

## 2023-10-11 ENCOUNTER — Telehealth: Payer: Self-pay

## 2023-10-11 DIAGNOSIS — K219 Gastro-esophageal reflux disease without esophagitis: Secondary | ICD-10-CM | POA: Diagnosis not present

## 2023-10-11 DIAGNOSIS — E119 Type 2 diabetes mellitus without complications: Secondary | ICD-10-CM | POA: Diagnosis not present

## 2023-10-11 DIAGNOSIS — K7581 Nonalcoholic steatohepatitis (NASH): Secondary | ICD-10-CM

## 2023-10-11 DIAGNOSIS — G43001 Migraine without aura, not intractable, with status migrainosus: Secondary | ICD-10-CM

## 2023-10-11 DIAGNOSIS — R188 Other ascites: Secondary | ICD-10-CM

## 2023-10-11 DIAGNOSIS — F418 Other specified anxiety disorders: Secondary | ICD-10-CM

## 2023-10-11 HISTORY — PX: IR PARACENTESIS: IMG2679

## 2023-10-11 LAB — COMPREHENSIVE METABOLIC PANEL WITH GFR
ALT: 28 U/L (ref 0–44)
AST: 40 U/L (ref 15–41)
Albumin: 2.3 g/dL — ABNORMAL LOW (ref 3.5–5.0)
Alkaline Phosphatase: 82 U/L (ref 38–126)
Anion gap: 5 (ref 5–15)
BUN: 6 mg/dL (ref 6–20)
CO2: 26 mmol/L (ref 22–32)
Calcium: 7.8 mg/dL — ABNORMAL LOW (ref 8.9–10.3)
Chloride: 102 mmol/L (ref 98–111)
Creatinine, Ser: 0.64 mg/dL (ref 0.44–1.00)
GFR, Estimated: >60 mL/min (ref >60)
Glucose, Bld: 184 mg/dL — ABNORMAL HIGH (ref 70–99)
Potassium: 3.2 mmol/L — ABNORMAL LOW (ref 3.5–5.1)
Sodium: 133 mmol/L — ABNORMAL LOW (ref 135–145)
Total Bilirubin: 2.5 mg/dL — ABNORMAL HIGH (ref 0.0–1.2)
Total Protein: 5.9 g/dL — ABNORMAL LOW (ref 6.5–8.1)

## 2023-10-11 LAB — PROTEIN, PLEURAL OR PERITONEAL FLUID: Total protein, fluid: <3 g/dL

## 2023-10-11 LAB — BODY FLUID CELL COUNT WITH DIFFERENTIAL
Eos, Fluid: 0 %
Lymphs, Fluid: 44 %
Monocyte-Macrophage-Serous Fluid: 49 % — ABNORMAL LOW (ref 50–90)
Neutrophil Count, Fluid: 7 % (ref 0–25)
Total Nucleated Cell Count, Fluid: 81 uL (ref 0–1000)

## 2023-10-11 LAB — CBC
HCT: 28.1 % — ABNORMAL LOW (ref 36.0–46.0)
Hemoglobin: 9.6 g/dL — ABNORMAL LOW (ref 12.0–15.0)
MCH: 32.5 pg (ref 26.0–34.0)
MCHC: 34.2 g/dL (ref 30.0–36.0)
MCV: 95.3 fL (ref 80.0–100.0)
Platelets: 69 10*3/uL — ABNORMAL LOW (ref 150–400)
RBC: 2.95 MIL/uL — ABNORMAL LOW (ref 3.87–5.11)
RDW: 15.6 % — ABNORMAL HIGH (ref 11.5–15.5)
WBC: 2.7 10*3/uL — ABNORMAL LOW (ref 4.0–10.5)
nRBC: 0 % (ref 0.0–0.2)

## 2023-10-11 LAB — GRAM STAIN: Gram Stain: NONE SEEN

## 2023-10-11 LAB — LACTATE DEHYDROGENASE, PLEURAL OR PERITONEAL FLUID: LD, Fluid: <25 U/L — ABNORMAL HIGH (ref 3–23)

## 2023-10-11 LAB — PROTIME-INR
INR: 1.6 — ABNORMAL HIGH (ref 0.8–1.2)
Prothrombin Time: 19.5 s — ABNORMAL HIGH (ref 11.4–15.2)

## 2023-10-11 LAB — GLUCOSE, CAPILLARY
Glucose-Capillary: 239 mg/dL — ABNORMAL HIGH (ref 70–99)
Glucose-Capillary: 162 mg/dL — ABNORMAL HIGH (ref 70–99)
Glucose-Capillary: 145 mg/dL — ABNORMAL HIGH (ref 70–99)

## 2023-10-11 MED ORDER — ACETAMINOPHEN 325 MG PO TABS: 650.0000 mg | ORAL_TABLET | Freq: Four times a day (QID) | ORAL | Status: DC | PRN

## 2023-10-11 MED ORDER — POTASSIUM CHLORIDE CRYS ER 20 MEQ PO TBCR: 20.0000 meq | EXTENDED_RELEASE_TABLET | Freq: Every day | ORAL | 0 refills | Status: DC

## 2023-10-11 MED ORDER — LACTULOSE 10 GM/15ML PO SOLN
20.0000 g | Freq: Two times a day (BID) | ORAL | Status: DC
  Administered 2023-10-11: 20 g via ORAL
  Filled 2023-10-11: qty 30

## 2023-10-11 MED ORDER — SPIRONOLACTONE 100 MG PO TABS: 200.0000 mg | ORAL_TABLET | Freq: Every day | ORAL | 0 refills | Status: DC

## 2023-10-11 MED ORDER — FIDAXOMICIN 200 MG PO TABS
200.0000 mg | ORAL_TABLET | Freq: Two times a day (BID) | ORAL | Status: DC
  Filled 2023-10-11: qty 1

## 2023-10-11 MED ORDER — LIDOCAINE HCL 1 % IJ SOLN
INTRAMUSCULAR | Status: AC
  Filled 2023-10-11: qty 20

## 2023-10-11 MED ORDER — PANTOPRAZOLE SODIUM 40 MG PO TBEC
40.0000 mg | DELAYED_RELEASE_TABLET | Freq: Every day | ORAL | Status: DC
  Administered 2023-10-11: 40 mg via ORAL
  Filled 2023-10-11: qty 1

## 2023-10-11 MED ORDER — LIDOCAINE HCL 1 % IJ SOLN
20.0000 mL | Freq: Once | INTRAMUSCULAR | Status: AC
  Administered 2023-10-11: 10 mL via INTRADERMAL
  Filled 2023-10-11: qty 20

## 2023-10-11 MED ORDER — POTASSIUM CHLORIDE CRYS ER 20 MEQ PO TBCR
40.0000 meq | EXTENDED_RELEASE_TABLET | Freq: Once | ORAL | Status: AC
  Administered 2023-10-11: 40 meq via ORAL
  Filled 2023-10-11: qty 2

## 2023-10-11 MED ORDER — SPIRONOLACTONE 25 MG PO TABS
200.0000 mg | ORAL_TABLET | Freq: Every day | ORAL | Status: DC
  Filled 2023-10-11: qty 8

## 2023-10-11 MED ORDER — FLUOXETINE HCL 20 MG PO CAPS
40.0000 mg | ORAL_CAPSULE | Freq: Two times a day (BID) | ORAL | Status: DC
  Administered 2023-10-11: 40 mg via ORAL
  Filled 2023-10-11: qty 2

## 2023-10-11 MED ORDER — FUROSEMIDE 80 MG PO TABS: 80.0000 mg | ORAL_TABLET | Freq: Every day | ORAL | 0 refills | Status: DC

## 2023-10-11 MED ORDER — FIDAXOMICIN 200 MG PO TABS: 200.0000 mg | ORAL_TABLET | Freq: Two times a day (BID) | ORAL | Status: DC

## 2023-10-11 MED ORDER — FUROSEMIDE 40 MG PO TABS
80.0000 mg | ORAL_TABLET | Freq: Every day | ORAL | Status: DC
  Administered 2023-10-11: 80 mg via ORAL
  Filled 2023-10-11: qty 2

## 2023-10-11 MED ORDER — ALBUMIN HUMAN 25 % IV SOLN
25.0000 g | Freq: Once | INTRAVENOUS | Status: AC
  Administered 2023-10-11: 25 g via INTRAVENOUS
  Filled 2023-10-11: qty 100

## 2023-10-11 MED ORDER — SPIRONOLACTONE 100 MG PO TABS
200.0000 mg | ORAL_TABLET | Freq: Every day | ORAL | Status: DC
  Administered 2023-10-11: 200 mg via ORAL
  Filled 2023-10-11: qty 2

## 2023-10-11 NOTE — Inpatient Diabetes Management (Signed)
 Inpatient Diabetes Program Recommendations  AACE/ADA: New Consensus Statement on Inpatient Glycemic Control (2015)  Target Ranges:  Prepandial:   less than 140 mg/dL      Peak postprandial:   less than 180 mg/dL (1-2 hours)      Critically ill patients:  140 - 180 mg/dL   Lab Results  Component Value Date   GLUCAP 162 (H) 10/11/2023   HGBA1C 6.1 (H) 10/05/2023    Review of Glycemic Control  Latest Reference Range & Units 10/10/23 21:53 10/11/23 08:19  Glucose-Capillary 70 - 99 mg/dL 760 (H) 837 (H)   Diabetes history: DM 2 Outpatient Diabetes medications:  Humulin  U500- 60 units with breakfast, 65 units with lunch and 65 units with dinner Current orders for Inpatient glycemic control:  Novolog  0-15 units tid with meals and HS  Inpatient Diabetes Program Recommendations:    Note patient is currently NPO.  Once diet restarted, may need to restart a portion of U500 insulin .  Will follow.   Thanks,  Randall Bullocks, RN, BC-ADM Inpatient Diabetes Coordinator Pager [phone]  (8a-5p)

## 2023-10-11 NOTE — TOC Transition Note (Signed)
 Transition of Care [facility]) - Discharge Note   Patient Details  Name: Laurie Sutton MRN: [id number] Date of Birth: 1980/07/05  Transition of Care [facility]) CM/SW Contact:  Roxie KANDICE Stain, RN Phone Number: 10/11/2023, 4:23 PM   Clinical Narrative:     Laurie Sutton is stable to discharge home.  No TOC needs at this time.  Final next level of care: Home/Self Care Barriers to Discharge: Barriers Resolved   Patient Goals and CMS Choice      Return home      Discharge Placement                 home      Discharge Plan and Services Additional resources added to the After Visit Summary for                                       Social Drivers of Health (SDOH) Interventions SDOH Screenings   Food Insecurity: No Food Insecurity (10/10/2023)  Housing: Low Risk  (10/10/2023)  Transportation Needs: No Transportation Needs (10/10/2023)  Utilities: Not At Risk (10/10/2023)  Depression (PHQ2-9): Low Risk  (12/02/2022)  Recent Concern: Depression (PHQ2-9) - Medium Risk (11/08/2022)  Financial Resource Strain: Low Risk  (09/19/2022)  Physical Activity: Insufficiently Active (06/04/2021)  Social Connections: Socially Integrated (10/05/2023)  Stress: Stress Concern Present (09/19/2022)  Tobacco Use: Medium Risk (10/10/2023)     Readmission Risk Interventions    10/11/2023    4:23 PM 10/06/2023    3:09 PM  Readmission Risk Prevention Plan  Post Dischage Appt  Complete  Medication Screening  Complete  Transportation Screening Complete Complete  PCP or Specialist Appt within 5-7 Days Complete   Home Care Screening Complete   Medication Review (RN CM) Complete

## 2023-10-11 NOTE — Hospital Course (Addendum)
 The patient is a 43 year old obese Caucasian female with past medical history of migraines, GERD, depression anxiety, diabetes mellitus type 2, history of liver cirrhosis secondary to [facility], history of frequent UTIs, history of for hypertension, esophageal varices who was recently admitted last week who Eilene presents again with abdominal pain and distention.  Found to have worsening ascites and IR was consulted she had a repeat paracentesis which yielded 6 L.  There is no evidence of SBP and she did well and GI recommended increasing her diuretics.  She is medically stable for discharge only to follow-up with PCP and GI in outpatient setting.  Assessment and Plan:  Decompensated Hepatic Cirrhosis / Hyperbilirubinemia Hyperammonemia History of portal hypertensive gastropathy History of esophageal varices -Presented with worsening abdominal pain and shortness of breath and found to have significant ascites.  Underwent paracentesis again which yielded 6 L and showed no evidence of SBP.  Discussed case with GI recommended stopping propranolol  and increasing diuretics with p.o. lactulose  2200 mL daily added p.o. furosemide  to 80 mg daily.  She is improved and able to the house without issues.  She will need follow-up with PCP and GI in outpatient setting follow-up with her hepatology appointment    C. Difficile and Campylobacter: Was septic last admission but now continued antibiotics with Dificid    Left ovarian hemorrhagic cyst: Noted to be 4.8 cm on last admission and can be further evaluated with a pelvic ultrasound but will defer for now and can be followed in outpatient setting   Dyspnea in the setting of volume overload: Did not desaturate on home amatory screening   Recent Dental Extraction:  Reported recent dental extraction have not received any prophylactic antibiotic.  Physical exam showed murmur.  Echocardiogram done last admission nd showed no Vegetation but did show G2DD.  Continue to monitor  and follow-up in outpatient setting  Hypokalemia: Mild. K+ was 3.2. Replete w/ po KCL 40 mEQ BID x2 and will continue repletion with p.o. KCl 20 mEq daily. CTM and Replete as Necessary. Repeat CMP in the AM    Depression / Generalized Anxiety Disorder: C/w Fluoxetine  40 mg po BID   Reactive airway disease: Continue Albuterol  2.5 mg Neb q6hprn Wheezing and SOB and Montelukast  10 mg qHS daily.   Insulin  Dependent Diabetes Mellitus Type 2: Recent HbA1c was 6.1. C/w Moderate Novolog  SSI AC/HS and resume home regimen at discharge   Pancytopenia: Fluctuating, in the setting of NASH Cirrhosis and infection from above. Has Chronic Thrombocytopenia 2/2 to Cirrhosis. CBC Trend: Recent Labs  Lab 10/04/23 2002 10/04/23 2208 10/05/23 0459 10/06/23 0552 10/07/23 0717 10/10/23 1504 10/10/23 1554 10/11/23 0510  WBC 4.4  --  2.9* 4.6 3.1* 2.7* 2.7* 2.7*  HGB 10.7*   < > 9.7* 9.8* 10.4* 10.5* 11.5* 9.6*  HCT 31.3*   < > 28.7* 28.0* 30.0* 31.1* 33.1* 28.1*  MCV 93.4  --  95.7 94.6 95.2 96.6 96.2 95.3  PLT 69*  --  49* 58* 66* 71* 70* 69*   < > = values in this interval not displayed.  -Recently Checked Anemia Panel and showed an iron level of 46, UIBC 165, TIBC of 211, saturation ratios of 22%, ferritin level of 108. CTM for S/Sx of Bleeding; no overt bleeding noted. CTM and Trend and repeat CBC in the AM   GERD/GI Prophylaxis: C/w Pantoprazole  40 mg po daily    Hypoalbuminemia: Patient's Albumin  Level went from 2.4 -> 3.1 -> 2.8  -> 2.3. CTM and Trend and repeat  CMP within 1 week   Class II Obesity: Complicates overall prognosis and care. Estimated body mass index is 35.41 kg/m as calculated from the following:   Height as of this encounter: 5' 6 (1.676 m).   Weight as of this encounter: 99.5 kg. Weight Loss and Dietary Counseling given

## 2023-10-11 NOTE — Telephone Encounter (Signed)
-----   Message from Elspeth SHAUNNA Naval sent at 10/11/2023  3:09 PM EDT ----- Regarding: [facility] patient This patient is being discharged today - needs follow up labs on Monday - BMET, under [facility], as diuretics have been adjusted and propranolol  stopped. Any way he or APP have anything open for her to be seen in 2-3 weeks or so? Thanks

## 2023-10-11 NOTE — Telephone Encounter (Signed)
 Appt made for 7/8 and labs have been entered pt aware

## 2023-10-11 NOTE — Discharge Summary (Signed)
 Physician Discharge Summary   Patient: Laurie Sutton MRN: [id number] DOB: 1981/01/04  Admit date:     10/10/2023  Discharge date: 10/11/23  Discharge Physician: Alejandro Marker, DO   PCP: [name], MD   Recommendations at discharge:   Follow-up with PCP within 1 to 2 weeks repeat CBC, CMP, mag, Phos within 1 week Follow-up with gastroenterology in outpatient setting  Discharge Diagnoses: Principal Problem:   Liver cirrhosis secondary to NASH [facility]) Active Problems:   Migraines   Depression with anxiety   GERD (gastroesophageal reflux disease)   Diabetes mellitus without complication (HCC)  Resolved Problems:   * No resolved hospital problems. [facility] Course: The patient is a 43 year old obese Caucasian female with past medical history of migraines, GERD, depression anxiety, diabetes mellitus type 2, history of liver cirrhosis secondary to [facility], history of frequent UTIs, history of for hypertension, esophageal varices who was recently admitted last week who Laurie Sutton presents again with abdominal pain and distention.  Found to have worsening ascites and IR was consulted she had a repeat paracentesis which yielded 6 L.  There is no evidence of SBP and she did well and GI recommended increasing her diuretics.  She is medically stable for discharge only to follow-up with PCP and GI in outpatient setting.  Assessment and Plan:  Decompensated Hepatic Cirrhosis / Hyperbilirubinemia Hyperammonemia History of portal hypertensive gastropathy History of esophageal varices -Presented with worsening abdominal pain and shortness of breath and found to have significant ascites.  Underwent paracentesis again which yielded 6 L and showed no evidence of SBP.  Discussed case with GI recommended stopping propranolol  and increasing diuretics with p.o. lactulose  2200 mL daily added p.o. furosemide  to 80 mg daily.  She is improved and able to the house without issues.  She will need follow-up with  PCP and GI in outpatient setting follow-up with her hepatology appointment    C. Difficile and Campylobacter: Was septic last admission but now continued antibiotics with Dificid    Left ovarian hemorrhagic cyst: Noted to be 4.8 cm on last admission and can be further evaluated with a pelvic ultrasound but will defer for now and can be followed in outpatient setting   Dyspnea in the setting of volume overload: Did not desaturate on home amatory screening   Recent Dental Extraction:  Reported recent dental extraction have not received any prophylactic antibiotic.  Physical exam showed murmur.  Echocardiogram done last admission nd showed no Vegetation but did show G2DD.  Continue to monitor and follow-up in outpatient setting  Hypokalemia: Mild. K+ was 3.2. Replete w/ po KCL 40 mEQ BID x2 and will continue repletion with p.o. KCl 20 mEq daily. CTM and Replete as Necessary. Repeat CMP in the AM    Depression / Generalized Anxiety Disorder: C/w Fluoxetine  40 mg po BID   Reactive airway disease: Continue Albuterol  2.5 mg Neb q6hprn Wheezing and SOB and Montelukast  10 mg qHS daily.   Insulin  Dependent Diabetes Mellitus Type 2: Recent HbA1c was 6.1. C/w Moderate Novolog  SSI AC/HS and resume home regimen at discharge   Pancytopenia: Fluctuating, in the setting of NASH Cirrhosis and infection from above. Has Chronic Thrombocytopenia 2/2 to Cirrhosis. CBC Trend: Recent Labs  Lab 10/04/23 2002 10/04/23 2208 10/05/23 0459 10/06/23 0552 10/07/23 0717 10/10/23 1504 10/10/23 1554 10/11/23 0510  WBC 4.4  --  2.9* 4.6 3.1* 2.7* 2.7* 2.7*  HGB 10.7*   < > 9.7* 9.8* 10.4* 10.5* 11.5* 9.6*  HCT 31.3*   < >  28.7* 28.0* 30.0* 31.1* 33.1* 28.1*  MCV 93.4  --  95.7 94.6 95.2 96.6 96.2 95.3  PLT 69*  --  49* 58* 66* 71* 70* 69*   < > = values in this interval not displayed.  -Recently Checked Anemia Panel and showed an iron level of 46, UIBC 165, TIBC of 211, saturation ratios of 22%, ferritin level of  108. CTM for S/Sx of Bleeding; no overt bleeding noted. CTM and Trend and repeat CBC in the AM   GERD/GI Prophylaxis: C/w Pantoprazole  40 mg po daily    Hypoalbuminemia: Patient's Albumin  Level went from 2.4 -> 3.1 -> 2.8  -> 2.3. CTM and Trend and repeat CMP within 1 week   Class II Obesity: Complicates overall prognosis and care. Estimated body mass index is 35.41 kg/m as calculated from the following:   Height as of this encounter: 5' 6 (1.676 m).   Weight as of this encounter: 99.5 kg. Weight Loss and Dietary Counseling given  Consultants: Discussed with GI, IR Procedures performed: Paracentesis Disposition: Home Diet recommendation:  Discharge Diet Orders (From admission, onward)     Start     Ordered   10/11/23 0000  Diet - low sodium heart healthy       Comments: 2 Gram Sodium Diet   10/11/23 1516   10/11/23 0000  Diet Carb Modified        10/11/23 1516           Cardiac diet -2 g sodium diet  DISCHARGE MEDICATION: Allergies as of 10/11/2023       Reactions   Vancomycin  Itching   Amoxicillin Hives, Itching   Azithromycin     DOES NOT WORK   Dulaglutide  Other (See Comments)   constipation and stomach issues  **Trulicity **   Empagliflozin -metformin  Hcl Er Other (See Comments), Swelling   Januvia  [sitagliptin ] Other (See Comments)   HURTS STOAMCH   Latex    IRRITATES VAGINAL AREA   Metformin     Other Reaction(s): achy all over   Penicillins Hives   Has patient had a PCN reaction causing immediate rash, facial/tongue/throat swelling, SOB or lightheadedness with hypotension: yes. Rash  Has patient had a PCN reaction causing severe rash involving mucus membranes or skin necrosis: Yes- rash and hives all over body  Has patient had a PCN reaction that required hospitalization No Has patient had a PCN reaction occurring within the last 10 years: Yes  If all of the above answers are NO, then may proceed with Cephalosporin use.        Medication List     STOP  taking these medications    propranolol  60 MG tablet Commonly known as: INDERAL        TAKE these medications    acetaminophen  325 MG tablet Commonly known as: TYLENOL  Take 2 tablets (650 mg total) by mouth every 6 (six) hours as needed for mild pain (pain score 1-3) or fever (or Fever >/= 101).   albuterol  108 (90 Base) MCG/ACT inhaler Commonly known as: VENTOLIN  HFA INHALE 2 PUFFS INTO THE LUNGS UP TO EVERY 6 HOURS AS NEEDED FOR WHEEZING/SHORTNESS OF BREATH   clotrimazole -betamethasone  cream Commonly known as: LOTRISONE  APPLY 1 APPLICATION TOPICALLY TWICE A DAY AS NEEDED   Dexcom G7 Sensor Misc Use 1 sensor for continuous glucose monitoring every 10 days for 30 days   Dificid  200 MG Tabs tablet Generic drug: fidaxomicin  Take 1 tablet (200 mg total) by mouth 2 (two) times daily for 8 days.   FLUoxetine  40  MG capsule Commonly known as: PROZAC  TAKE 1 CAPSULE BY MOUTH TWICE A DAY   furosemide  80 MG tablet Commonly known as: LASIX  Take 1 tablet (80 mg total) by mouth daily. What changed:  medication strength how much to take   HumuLIN  R U-500 KwikPen 500 UNIT/ML KwikPen Generic drug: insulin  regular human CONCENTRATED Use 120 units at breakfast, 100 at lunch, and 100 at dinner What changed: additional instructions   lactulose  10 GM/15ML solution Commonly known as: CHRONULAC  Take 30 mLs (20 g total) by mouth 2 (two) times daily.   montelukast  10 MG tablet Commonly known as: SINGULAIR  Take 1 tablet (10 mg total) by mouth at bedtime.   Mounjaro  7.5 MG/0.5ML Pen Generic drug: tirzepatide  Inject 7.5 mg Subcutaneous once a week 30 days   omeprazole  40 MG capsule Commonly known as: PRILOSEC TAKE 1 CAPSULE (40 MG TOTAL) BY MOUTH IN THE MORNING   ondansetron  4 MG disintegrating tablet Commonly known as: ZOFRAN -ODT 1 tablet on the tongue and allow to dissolve by mouth three times a day; Duration: 30 days   potassium chloride  SA 20 MEQ tablet Commonly known as:  KLOR-CON  M Take 1 tablet (20 mEq total) by mouth daily.   spironolactone  100 MG tablet Commonly known as: ALDACTONE  Take 2 tablets (200 mg total) by mouth daily. What changed:  medication strength how much to take        Follow-up Information     [name], MD. Call.   Specialty: Family Medicine Why: Follow up within 1-2 weeks Contact information: [address] [place] [address] [phone]         [facility]. Call.   Specialty: Gastroenterology Why: Follow up on Monday 10/16/23 for Labwork and follow up; Office will call you to schedule Hospital Follow up Contact information: [address] [place]  [address] [phone]               Discharge Exam: Laurie Sutton   10/10/23 2200  Weight: 99.5 kg   Vitals:   10/11/23 1118 10/11/23 1547  BP: (!) 96/45 102/67  Pulse:    Resp:    Temp:    SpO2:  100%   Examination: Physical Exam:  Constitutional: WN/WD obese Caucasian female no acute distress Respiratory: Diminished to auscultation bilaterally, no wheezing, rales, rhonchi or crackles. Normal respiratory effort and patient is not tachypenic. No accessory muscle use.  Unlabored breathing Cardiovascular: RRR, no murmurs / rubs / gallops. S1 and S2 auscultated.  Normal close extremities Abdomen: Soft, slightly tender, tender secondary to body habitus and ascites bowel sounds positive.  GU: Deferred. Musculoskeletal: No clubbing / cyanosis of digits/nails. No joint deformity upper and lower extremities.  Skin: No rashes, lesions, ulcers on limited skin evaluation. No induration; Warm and dry.  Neurologic: CN 2-12 grossly intact with no focal deficits. Romberg sign and cerebellar reflexes not assessed.  Psychiatric: Normal judgment and insight. Alert and oriented x 3. Normal mood and appropriate affect.   Condition at discharge: stable  The results of significant diagnostics from this  hospitalization (including imaging, microbiology, ancillary and laboratory) are listed below for reference.   Imaging Studies: IR Paracentesis Result Date: 10/11/2023 INDICATION: Patient with NASH cirrhosis and recurrent ascites. Request for diagnostic and therapeutic paracentesis. EXAM: ULTRASOUND GUIDED LEFT LOWER QUADRANT PARACENTESIS MEDICATIONS: 1% plain lidocaine , 5 mL COMPLICATIONS: None immediate. PROCEDURE: Informed written consent was obtained from the patient after a discussion of the risks, benefits and alternatives to treatment. A timeout  was performed prior to the initiation of the procedure. Initial ultrasound scanning demonstrates a large amount of ascites within the left lower abdominal quadrant. The left lower abdomen was prepped and draped in the usual sterile fashion. 1% lidocaine  was used for local anesthesia. Following this, a 19 gauge, 7-cm, Yueh catheter was introduced. An ultrasound image was saved for documentation purposes. The paracentesis was performed. The catheter was removed and a dressing was applied. The patient tolerated the procedure well without immediate post procedural complication. FINDINGS: A total of approximately 6.5 L of hazy yellow fluid was removed. Samples were sent to the laboratory as requested by the clinical team. IMPRESSION: Successful ultrasound-guided paracentesis yielding 6 point liters of peritoneal fluid. Procedure performed by Franky Rusk PA-C and supervised by Dr. Cathlyn Banner Electronically Signed   By: Cordella Banner   On: 10/11/2023 14:08   DG Chest 2 View Result Date: 10/10/2023 CLINICAL DATA:  Shortness of breath. EXAM: CHEST - 2 VIEW COMPARISON:  10/06/2023 FINDINGS: Low volume film. The lungs are clear without focal pneumonia, edema, pneumothorax or pleural effusion. Cardiopericardial silhouette is at upper limits of normal for size. No acute bony abnormality. IMPRESSION: No active cardiopulmonary disease. Electronically Signed   By: Camellia Candle M.D.   On: 10/10/2023 13:59   DG CHEST PORT 1 VIEW Result Date: 10/06/2023 CLINICAL DATA:  [id number] SOB (shortness of breath) EXAM: PORTABLE CHEST 1 VIEW COMPARISON:  Chest radiograph 10/04/2023 and chest CTA 10/04/2023 FINDINGS: Both lungs are clear. No focal airspace disease or lung consolidation. Heart and mediastinum are within normal limits. Trachea is midline. Patient is slightly rotated towards the left. Negative for a pneumothorax. IMPRESSION: No acute cardiopulmonary disease. Electronically Signed   By: Juliene Balder M.D.   On: 10/06/2023 09:10   ECHOCARDIOGRAM COMPLETE Result Date: 10/05/2023    ECHOCARDIOGRAM REPORT   Patient Name:   Laurie Sutton Date of Exam: 10/05/2023 Medical Rec #:  [id number]         Height:       66.0 in Accession #:    [id number]        Weight:       220.0 lb Date of Birth:  1980/05/11        BSA:          2.082 m Patient Age:    42 years          BP:           106/54 mmHg Patient Gender: F                 HR:           71 bpm. Exam Location:  Inpatient Procedure: 2D Echo, Cardiac Doppler and Color Doppler (Both Spectral and Color            Flow Doppler were utilized during procedure). Indications:    Murmor R01.1  History:        Patient has no prior history of Echocardiogram examinations.                 Migraines; Risk Factors:Diabetes.  Sonographer:    Thea Norlander RCS Referring Phys: SUBRINA SUNDIL IMPRESSIONS  1. Left ventricular ejection fraction, by estimation, is 60 to 65%. The left ventricle has normal function. The left ventricle has no regional wall motion abnormalities. Left ventricular diastolic parameters are consistent with Grade II diastolic dysfunction (pseudonormalization).  2. Right ventricular systolic function is normal. The right ventricular size is normal. There  is normal pulmonary artery systolic pressure.  3. Left atrial size was moderately dilated.  4. The mitral valve is normal in structure. Trivial mitral valve regurgitation. No  evidence of mitral stenosis.  5. The aortic valve is normal in structure. Aortic valve regurgitation is not visualized. No aortic stenosis is present.  6. The inferior vena cava is dilated in size with <50% respiratory variability, suggesting right atrial pressure of 15 mmHg. FINDINGS  Left Ventricle: Left ventricular ejection fraction, by estimation, is 60 to 65%. The left ventricle has normal function. The left ventricle has no regional wall motion abnormalities. The left ventricular internal cavity size was normal in size. There is  no left ventricular hypertrophy. Left ventricular diastolic parameters are consistent with Grade II diastolic dysfunction (pseudonormalization). Right Ventricle: The right ventricular size is normal. No increase in right ventricular wall thickness. Right ventricular systolic function is normal. There is normal pulmonary artery systolic pressure. The tricuspid regurgitant velocity is 2.47 m/s, and  with an assumed right atrial pressure of 10 mmHg, the estimated right ventricular systolic pressure is 34.4 mmHg. Left Atrium: Left atrial size was moderately dilated. Right Atrium: Right atrial size was normal in size. Pericardium: There is no evidence of pericardial effusion. Mitral Valve: The mitral valve is normal in structure. Trivial mitral valve regurgitation. No evidence of mitral valve stenosis. Tricuspid Valve: The tricuspid valve is normal in structure. Tricuspid valve regurgitation is mild . No evidence of tricuspid stenosis. Aortic Valve: The aortic valve is normal in structure. Aortic valve regurgitation is not visualized. No aortic stenosis is present. Aortic valve peak gradient measures 14.4 mmHg. Pulmonic Valve: The pulmonic valve was normal in structure. Pulmonic valve regurgitation is not visualized. No evidence of pulmonic stenosis. Aorta: The aortic root is normal in size and structure. Venous: The inferior vena cava is dilated in size with less than 50% respiratory  variability, suggesting right atrial pressure of 15 mmHg. IAS/Shunts: No atrial level shunt detected by color flow Doppler.  LEFT VENTRICLE PLAX 2D LVIDd:         5.20 cm   Diastology LVIDs:         2.90 cm   LV e' medial:    8.70 cm/s LV PW:         0.70 cm   LV E/e' medial:  12.9 LV IVS:        0.80 cm   LV e' lateral:   13.30 cm/s LVOT diam:     2.20 cm   LV E/e' lateral: 8.4 LV SV:         112 LV SV Index:   54 LVOT Area:     3.80 cm  RIGHT VENTRICLE             IVC RV S prime:     17.10 cm/s  IVC diam: 2.20 cm TAPSE (M-mode): 3.0 cm LEFT ATRIUM              Index        RIGHT ATRIUM           Index LA diam:        4.60 cm  2.21 cm/m   RA Area:     15.50 cm LA Vol (A2C):   63.6 ml  30.54 ml/m  RA Volume:   39.30 ml  18.87 ml/m LA Vol (A4C):   116.0 ml 55.70 ml/m LA Biplane Vol: 89.5 ml  42.98 ml/m  AORTIC VALVE AV Area (Vmax): 2.54 cm AV Vmax:  190.00 cm/s AV Peak Grad:   14.4 mmHg LVOT Vmax:      127.00 cm/s LVOT Vmean:     85.600 cm/s LVOT VTI:       0.294 m  AORTA Ao Root diam: 3.00 cm Ao Asc diam:  3.30 cm MITRAL VALVE                TRICUSPID VALVE MV Area (PHT): 2.91 cm     TR Peak grad:   24.4 mmHg MV Decel Time: 261 msec     TR Vmax:        247.00 cm/s MV E velocity: 112.00 cm/s MV A velocity: 89.10 cm/s   SHUNTS MV E/A ratio:  1.26         Systemic VTI:  0.29 m                             Systemic Diam: 2.20 cm Aditya Sabharwal Electronically signed by Ria Commander Signature Date/Time: 10/05/2023/5:35:57 PM    Final    IR Paracentesis Result Date: 10/05/2023 INDICATION: Patient with a history of MASH cirrhosis with recurrent ascites. Request for therapeutic paracentesis. EXAM: ULTRASOUND GUIDED DIAGNOSTIC AND THERAPEUTIC PARACENTESIS MEDICATIONS: 6 mL 1% lidocaine  COMPLICATIONS: None immediate. PROCEDURE: Informed written consent was obtained from the patient after a discussion of the risks, benefits and alternatives to treatment. A timeout was performed prior to the initiation of  the procedure. Initial ultrasound scanning demonstrates a large amount of ascites within the left lower abdominal quadrant. The left lower abdomen was prepped and draped in the usual sterile fashion. 1% lidocaine  was used for local anesthesia. Following this, a 19 gauge, 7-cm, Yueh catheter was introduced. An ultrasound image was saved for documentation purposes. The paracentesis was performed. The catheter was removed and a dressing was applied. The patient tolerated the procedure well without immediate post procedural complication. FINDINGS: A total of approximately 3 liters of clear, yellow fluid was removed. Samples were sent to the laboratory as requested by the clinical team. IMPRESSION: Successful ultrasound-guided paracentesis yielding 3 liters of peritoneal fluid. PLAN: If the patient eventually requires >/=2 paracenteses in a 30 day period, candidacy for formal evaluation by the [facility] Interventional Radiology [facility] Hypert[facility] will be assessed. Performed by Laymon Coast, NP Electronically Signed   By: Cordella Banner   On: 10/05/2023 15:29   CT Angio Chest PE W and/or Wo Contrast Result Date: 10/04/2023 CLINICAL DATA:  High probability for PE. Non alcoholic cirrhosis. Chills and weakness. Acute abdominal pain. EXAM: CT ANGIOGRAPHY CHEST CT ABDOMEN AND PELVIS WITH CONTRAST TECHNIQUE: Multidetector CT imaging of the chest was performed using the standard protocol during bolus administration of intravenous contrast. Multiplanar CT image reconstructions and MIPs were obtained to evaluate the vascular anatomy. Multidetector CT imaging of the abdomen and pelvis was performed using the standard protocol during bolus administration of intravenous contrast. RADIATION DOSE REDUCTION: This exam was performed according to the departmental dose-optimization program which includes automated exposure control, adjustment of the mA and/or kV according to patient size and/or use of iterative  reconstruction technique. CONTRAST:  75mL OMNIPAQUE  IOHEXOL  350 MG/ML SOLN COMPARISON:  CT abdomen and pelvis 06/06/2023 FINDINGS: CTA CHEST FINDINGS Cardiovascular: Satisfactory opacification of the pulmonary arteries to the segmental level. No evidence of pulmonary embolism. Heart is borderline enlarged/mildly enlarged. No pericardial effusion. Mediastinum/Nodes: No enlarged mediastinal, hilar, or axillary lymph nodes. Thyroid  gland, trachea, and esophagus demonstrate no significant findings. Lungs/Pleura: There is  mild atelectasis in the bilateral lower lobes and lingula. There is no pleural effusion or pneumothorax. Musculoskeletal: No chest wall abnormality. No acute or significant osseous findings. Review of the MIP images confirms the above findings. CT ABDOMEN and PELVIS FINDINGS Hepatobiliary: There is nodular liver contour, unchanged. Gallstones are present. There is no biliary ductal dilatation. Pancreas: Unremarkable. No pancreatic ductal dilatation or surrounding inflammatory changes. Spleen: Spleen is enlarged measuring up to 18 cm similar to prior. Adrenals/Urinary Tract: There are 2 subcentimeter hypodensities in the left kidney which are too small to characterize, likely cysts. Otherwise, the kidneys, adrenal glands and bladder are within normal limits. Stomach/Bowel: There is diffuse wall thickening of the ascending colon, transverse colon and descending colon. The appendix is within normal limits. No bowel obstruction, pneumatosis or free air visualized. Stomach is within normal limits. Vascular/Lymphatic: Aorta and IVC are normal in size. Splenic varices are present. No enlarged lymph nodes are identified. Reproductive: 4.8 cm cyst with hyperdense fluid fluid level noted in the left ovary. Right ovary is within normal limits. The uterus is surgically absent. Other: There is large volume ascites. There is no focal abdominal wall hernia or free air. Musculoskeletal: No fracture is seen. Review of  the MIP images confirms the above findings. IMPRESSION: 1. No evidence for pulmonary embolism. 2. Mild bibasilar atelectasis. 3. Cirrhotic liver with sequela of portal hypertension including splenomegaly, varices and large volume ascites. 4. Diffuse wall thickening of the ascending, transverse and descending colon worrisome for colitis. 5. 4.8 cm left ovarian hemorrhagic cyst. This can be further evaluated with pelvic ultrasound. 6. Cholelithiasis. Electronically Signed   By: Greig Pique M.D.   On: 10/04/2023 21:47   CT ABDOMEN PELVIS W CONTRAST Result Date: 10/04/2023 CLINICAL DATA:  High probability for PE. Non alcoholic cirrhosis. Chills and weakness. Acute abdominal pain. EXAM: CT ANGIOGRAPHY CHEST CT ABDOMEN AND PELVIS WITH CONTRAST TECHNIQUE: Multidetector CT imaging of the chest was performed using the standard protocol during bolus administration of intravenous contrast. Multiplanar CT image reconstructions and MIPs were obtained to evaluate the vascular anatomy. Multidetector CT imaging of the abdomen and pelvis was performed using the standard protocol during bolus administration of intravenous contrast. RADIATION DOSE REDUCTION: This exam was performed according to the departmental dose-optimization program which includes automated exposure control, adjustment of the mA and/or kV according to patient size and/or use of iterative reconstruction technique. CONTRAST:  75mL OMNIPAQUE  IOHEXOL  350 MG/ML SOLN COMPARISON:  CT abdomen and pelvis 06/06/2023 FINDINGS: CTA CHEST FINDINGS Cardiovascular: Satisfactory opacification of the pulmonary arteries to the segmental level. No evidence of pulmonary embolism. Heart is borderline enlarged/mildly enlarged. No pericardial effusion. Mediastinum/Nodes: No enlarged mediastinal, hilar, or axillary lymph nodes. Thyroid  gland, trachea, and esophagus demonstrate no significant findings. Lungs/Pleura: There is mild atelectasis in the bilateral lower lobes and lingula.  There is no pleural effusion or pneumothorax. Musculoskeletal: No chest wall abnormality. No acute or significant osseous findings. Review of the MIP images confirms the above findings. CT ABDOMEN and PELVIS FINDINGS Hepatobiliary: There is nodular liver contour, unchanged. Gallstones are present. There is no biliary ductal dilatation. Pancreas: Unremarkable. No pancreatic ductal dilatation or surrounding inflammatory changes. Spleen: Spleen is enlarged measuring up to 18 cm similar to prior. Adrenals/Urinary Tract: There are 2 subcentimeter hypodensities in the left kidney which are too small to characterize, likely cysts. Otherwise, the kidneys, adrenal glands and bladder are within normal limits. Stomach/Bowel: There is diffuse wall thickening of the ascending colon, transverse colon and descending colon. The  appendix is within normal limits. No bowel obstruction, pneumatosis or free air visualized. Stomach is within normal limits. Vascular/Lymphatic: Aorta and IVC are normal in size. Splenic varices are present. No enlarged lymph nodes are identified. Reproductive: 4.8 cm cyst with hyperdense fluid fluid level noted in the left ovary. Right ovary is within normal limits. The uterus is surgically absent. Other: There is large volume ascites. There is no focal abdominal wall hernia or free air. Musculoskeletal: No fracture is seen. Review of the MIP images confirms the above findings. IMPRESSION: 1. No evidence for pulmonary embolism. 2. Mild bibasilar atelectasis. 3. Cirrhotic liver with sequela of portal hypertension including splenomegaly, varices and large volume ascites. 4. Diffuse wall thickening of the ascending, transverse and descending colon worrisome for colitis. 5. 4.8 cm left ovarian hemorrhagic cyst. This can be further evaluated with pelvic ultrasound. 6. Cholelithiasis. Electronically Signed   By: Greig Pique M.D.   On: 10/04/2023 21:47   DG Chest 2 View Result Date: 10/04/2023 CLINICAL DATA:   Possible sepsis EXAM: CHEST - 2 VIEW COMPARISON:  04/14/2022 FINDINGS: Cardiac shadow is within normal limits. Minimal left basilar atelectasis is noted. No bony abnormality is seen. IMPRESSION: Minimal left basilar atelectasis. Electronically Signed   By: Oneil Devonshire M.D.   On: 10/04/2023 20:47   Microbiology: Results for orders placed or performed during the hospital encounter of 10/10/23  Gram stain     Status: None   Collection Time: 10/11/23 10:55 AM   Specimen: Peritoneal Washings  Result Value Ref Range Status   Specimen Description PERITONEAL  Final   Special Requests ABDOMEN  Final   Gram Stain   Final    NO WBC SEEN NO ORGANISMS SEEN Performed at [facility], [address], [place], [place] [address]    Report Status 10/11/2023 FINAL  Final   Labs: CBC: Recent Labs  Lab 10/06/23 0552 10/07/23 0717 10/10/23 1504 10/10/23 1554 10/11/23 0510  WBC 4.6 3.1* 2.7* 2.7* 2.7*  NEUTROABS 3.6 2.0  --  2.1  --   HGB 9.8* 10.4* 10.5* 11.5* 9.6*  HCT 28.0* 30.0* 31.1* 33.1* 28.1*  MCV 94.6 95.2 96.6 96.2 95.3  PLT 58* 66* 71* 70* 69*   Basic Metabolic Panel: Recent Labs  Lab 10/05/23 0459 10/06/23 0552 10/07/23 0717 10/10/23 1504 10/11/23 0510  NA 132* 134* 133* 131* 133*  K 4.4 3.1* 3.6 3.2* 3.2*  CL 106 106 105 100 102  CO2 20* 23 21* 22 26  GLUCOSE 420* 266* 226* 398* 184*  BUN 12 12 7 7 6   CREATININE 1.00 0.71 0.71 0.73 0.64  CALCIUM 8.3* 8.2* 8.1* 8.0* 7.8*  MG  --  1.8 1.8  --   --   PHOS  --  2.9 2.9  --   --    Liver Function Tests: Recent Labs  Lab 10/05/23 0459 10/06/23 0552 10/07/23 0717 10/10/23 1504 10/11/23 0510  AST 54* 41 60* 49* 40  ALT 30 27 34 31 28  ALKPHOS 76 89 84 95 82  BILITOT 3.6* 2.2* 2.0* 2.6* 2.5*  PROT 6.8 6.6 6.4* 6.1* 5.9*  ALBUMIN  3.1* 2.8* 2.8* 2.5* 2.3*   CBG: Recent Labs  Lab 10/07/23 0822 10/07/23 1204 10/10/23 2153 10/11/23 0819 10/11/23 1240  GLUCAP 279* 329* 239* 162* 145*   Discharge time spent:  greater than 30 minutes.  Signed: Alejandro Marker, DO [facility] 10/11/2023

## 2023-10-11 NOTE — Transitions of Care (Post Inpatient/ED Visit) (Signed)
 10/11/2023  Patient ID: Laurie Sutton, female   DOB: [birth year], 43 y.o.   MRN: [id number]    Chart Review for transitions of care. Patient currently inpatient.    Charmion Hapke J. Neetu Carrozza RN, MSN [facility], [facility] RN Care Manager Direct Dial: [phone]  Fax: [phone] Website: [email or web address]

## 2023-10-11 NOTE — TOC CM/SW Note (Signed)
 Transition of Care [facility]) - Inpatient Brief Assessment   Patient Details  Name: Laurie Sutton MRN: [id number] Date of Birth: 10-04-1980  Transition of Care [facility]) CM/SW Contact:    Lauraine FORBES Saa, LCSW Phone Number: 10/11/2023, 9:08 AM   Clinical Narrative:  9:08 AM Per chart review, patient resides at home with spouse. Patient has a PCP and insurance. Patient does not have SNF/HH/DME history. Patient's preferred pharmacy's are [facility] [address] [facility], [facility] [address], [address] [facility], and [facility] pharmacy. No TOC needs were identified at this time. TOC will continue to follow and be available to assist.  Transition of Care Asessment: Insurance and Status: Insurance coverage has been reviewed Patient has primary care physician: Yes Home environment has been reviewed: Private Residence Prior level of function:: N/A Prior/Current Home Services: No current home services Social Drivers of Health Review: SDOH reviewed no interventions necessary Readmission risk has been reviewed: Yes Transition of care needs: no transition of care needs at this time

## 2023-10-11 NOTE — Plan of Care (Signed)

## 2023-10-12 ENCOUNTER — Telehealth: Payer: Self-pay

## 2023-10-12 ENCOUNTER — Encounter: Admitting: Podiatry

## 2023-10-12 NOTE — Transitions of Care (Post Inpatient/ED Visit) (Signed)
 10/12/2023  Name: Laurie Sutton MRN: [id number] DOB: 1980/04/13  Today's TOC FU Call Status: Today's TOC FU Call Status:: Successful TOC FU Call Completed TOC FU Call Complete Date: 10/12/23 Patient's Name and Date of Birth confirmed.  Transition Care Management Follow-up Telephone Call Date of Discharge: 10/11/23 Discharge Facility: [facility]) Type of Discharge: Inpatient Admission Primary Inpatient Discharge Diagnosis:: NASH How have you been since you were released from the hospital?: Better Any questions or concerns?: No  Items Reviewed: Did you receive and understand the discharge instructions provided?: Yes Medications obtained,verified, and reconciled?: Yes (Medications Reviewed) Any new allergies since your discharge?: No Dietary orders reviewed?: Yes Do you have support at home?: Yes People in Home [RPT]: child(ren), adult, spouse  Medications Reviewed Today: Medications Reviewed Today     Reviewed by Emmitt Pan, LPN (Licensed Practical Nurse) on 10/12/23 at 1031  Med List Status: <None>   Medication Order Taking? Sig Documenting Provider Last Dose Status Informant  acetaminophen  (TYLENOL ) 325 MG tablet [id number] Yes Take 2 tablets (650 mg total) by mouth every 6 (six) hours as needed for mild pain (pain score 1-3) or fever (or Fever >/= 101). Sherrill Cable [place], DO  Active Self, Pharmacy Records  albuterol  (VENTOLIN  HFA) 108 [phone]) MCG/ACT inhaler [id number] Yes INHALE 2 PUFFS INTO THE LUNGS UP TO EVERY 6 HOURS AS NEEDED FOR WHEEZING/SHORTNESS OF SHERIDA Johnny Garnette DELENA, MD  Active Self, Pharmacy Records           Med Note (LEE, NICOLE   Mon Oct 10, 2023  9:18 PM) Rescue inhaler  clotrimazole -betamethasone  (LOTRISONE ) cream [id number] Yes APPLY 1 APPLICATION TOPICALLY TWICE A DAY AS NEEDED Johnny Garnette DELENA, MD  Active Self, Pharmacy Records  Continuous Glucose Sensor (DEXCOM G7 SENSOR) MISC [id number] Yes Use 1 sensor for continuous glucose monitoring every  10 days for 30 days Braulio Hough, MD  Active Self, Pharmacy Records  fidaxomicin  (DIFICID ) 200 MG TABS tablet [id number] Yes Take 1 tablet (200 mg total) by mouth 2 (two) times daily for 8 days. Sherrill Cable [place], [place]  Active Self, Pharmacy Records  FLUoxetine  (PROZAC ) 40 MG capsule [id number] Yes TAKE 1 CAPSULE BY MOUTH TWICE A DAY Johnny Garnette DELENA, MD  Active Self, Pharmacy Records  furosemide  (LASIX ) 80 MG tablet [id number] Yes Take 1 tablet (80 mg total) by mouth daily. Sheikh, Omair [place], [place]  Active   HUMULIN  R U-500 KWIKPEN 500 UNIT/ML KwikPen [id number] Yes Use 120 units at breakfast, 100 at lunch, and 100 at dinner  Patient taking differently: Use 60 units at breakfast, 65 at lunch, and 65 at dinner   Johnny Garnette DELENA, MD  Active Self, Pharmacy Records           Med Note LEOBARDO, NICOLE   Mon Oct 10, 2023  9:24 PM) Pt took 45 units this morning as she ate a light breakfast  lactulose  (CHRONULAC ) 10 GM/15ML solution [id number] Yes Take 30 mLs (20 g total) by mouth 2 (two) times daily. Sheikh, Omair [place], DO  Active Self, Pharmacy Records  montelukast  (SINGULAIR ) 10 MG tablet [id number] Yes Take 1 tablet (10 mg total) by mouth at bedtime. Johnny Garnette DELENA, MD  Active Self, Pharmacy Records  omeprazole  [facility]) 40 MG capsule [id number] Yes TAKE 1 CAPSULE (40 MG TOTAL) BY MOUTH IN THE MORNING Danis, Victory LITTIE MOULD, MD  Active Self, Pharmacy Records  ondansetron  (ZOFRAN -ODT) 4 MG disintegrating tablet [id number] Yes 1 tablet on the tongue and allow to dissolve by mouth  three times a day; Duration: 30 days [provider]  Active Self, Pharmacy Records  potassium chloride  SA (KLOR-CON  M) 20 MEQ tablet [id number] Yes Take 1 tablet (20 mEq total) by mouth daily. Sheikh, Omair [place], [place]  Active   spironolactone  (ALDACTONE ) 100 MG tablet [id number] Yes Take 2 tablets (200 mg total) by mouth daily. Sheikh, Omair [place], DO  Active   tirzepatide  (MOUNJARO ) 7.5 MG/0.5ML Pen [id number] Yes Inject 7.5 mg Subcutaneous  once a week 30 days   Active Self, Pharmacy Records           Med Note LEOBARDO, NICOLE   Mon Oct 10, 2023  9:32 PM) Not taken due to illness            Home Care and Equipment/Supplies: Were Home Health Services Ordered?: NA Any new equipment or medical supplies ordered?: NA  Functional Questionnaire: Do you need assistance with bathing/showering or dressing?: No Do you need assistance with meal preparation?: No Do you need assistance with eating?: No Do you have difficulty maintaining continence: No Do you need assistance with getting out of bed/getting out of a chair/moving?: No Do you have difficulty managing or taking your medications?: No  Follow up appointments reviewed: PCP Follow-up appointment confirmed?: Yes Date of PCP follow-up appointment?: 10/19/23 Follow-up Provider: [facility] Follow-up appointment confirmed?: Yes Date of Specialist follow-up appointment?: 10/18/23 Follow-Up Specialty Provider:: GI Do you need transportation to your follow-up appointment?: No Do you understand care options if your condition(s) worsen?: Yes-patient verbalized understanding    SIGNATURE Julian Lemmings, LPN [facility] Nurse Health Advisor Direct Dial [phone]

## 2023-10-16 LAB — CULTURE, BODY FLUID W GRAM STAIN -BOTTLE: Culture: NO GROWTH

## 2023-10-17 NOTE — Progress Notes (Unsigned)
 10/18/2023 Laurie Sutton [id number] 1981/02/11  Referring provider: [name], MD Primary GI doctor: Dr. Legrand  ASSESSMENT AND PLAN:  Cirrhosis with history of splenomegaly, portal hypertension, recent decompensation likely due to Cdiff with ascites and HE, now with nausea Keep appointment on 18th with [facility] 10/11/2023 WBC 2.7 HGB @LLABV (HGB) Platelets 69 10/11/2023 AST 40 ALT 28 Alkphos 82 TBili 2.5 10/11/2023 INR 1.6 MELD 3.0: 20 at 10/11/2023  5:10 AM  Serologic workup: - December 2022, hepatitis A total antibody reactive, hepatitis B surface antibody negative - suggest getting hep B vaccination, can get with dawn next week  Ascites:      Last LVP 10/11/2023 6.5 L hazy yellow fluid, no SBP She is on lasix  80 mg daily and she is on spironolactone  200 mg daily which was increased in the hospital Has ascites on exam -Nutrition and low sodium diet discussed with patient and information given Diagnostic and theraputic paracentesis- if they take off 4-5 L please give IV 25 grams albumin  x 1 Please send for cell count with differential No maximum limit to be removed  Varices screening / surveillance EGD:    Last EGD 08/02/2023 grade 2 varices and portal hypertensive gastropathy Not on prophylaxis states was discontinued in the hospital, was on propanolol 60 mg BID for migraines.  Pending kidney function can consider adding back on low dose with monitoring BP  if not repeat EGD 2 years unless patient has melena, anemia, etc  Hepatic encephalopathy:  Has had some confusion per daughter, some astrexis on exam She is on lactulose  30 ml BID,  Increase to TID, check labs, adjust for BM every 2-3 days Consider xifaxin  Most recent HCC screening:    10/04/2023 CT abdomen pelvis with contrast shows no evidence of PE, cirrhotic liver portal hypertension splenomegaly varices large volume ascites, diffuse wall thickening ascending transverse descending colon, 4.8 cm left ovarian hemorrhagic  cyst and cholelithiasis AFP 6.9 06/29/2023 If AFP still elevated, consider MRI AB with eovist  Provided general information to the patient: -Continue daily multivitamin -Recommended 30 minutes of aerobic and resistance exercise 3 days/week -Encouraged pt to increase protein intake  Nausea worse with food since hospital On omeprazole , no GERD, no vomiting Gets full quickly, on mounjaro  but has not had for 2 weeks Possibly from ascites still, some fear of infection/abnormal kidney/electolyte issues - advised to not get on mounjaro  at this time - send for paracentesis - zofran  as needed - check labs  History of C. Difficile + 09/30/2023 and treated with dificid  She is not having any diarrhea but she continues to have nausea -Hand washing, wiping down surfaces, and avoiding contamination discussed with the patient. Given patient information -Avoid ABX in the future -Follow up if any worsening symptoms or no improving symptoms, ER precautions discussed  Type 2 diabetes On mounjaro , suggest not starting at this time  Patient Care Team: [name], MD as PCP - General (Family Medicine) Braulio Hough, MD as Referring Physician (Internal Medicine)  HISTORY OF PRESENT ILLNESS: 43 y.o. female with medical history significant cirrhosis secondary to [facility] with recent decompensation likely due to Cdiff Has history of the following complications from cirrhosis: esophageal varices, portal hypertensive gastropathy, ascites, thrombocytopenia, coagulopathy, and hepatic encephalopathy  Patient was last seen in the office by Dr. Legrand 04/20/2023, referred to [facility].  With recent hospitalization 6/25 through 6/27 with fever, diarrhea found to be positive C. difficile and Campylobacter treated with Dificid  which likely caused decompensation.  B lood cultures negative, ascites without infection. Last LVP 10/11/2023 6.5 L hazy yellow fluid, no SBP She is on lasix  80 mg daily and she is on  spironolactone  200 mg daily which was increased in the hospital  Last EGD was 08/02/2023 at [facility] long grade 2 esophageal varices, portal hypertensive gastropathy nodular mucosa in the antrum normal duodenum no specimens collected.  Last HCC screen: 10/04/2023 CT abdomen pelvis with contrast shows no evidence of PE, cirrhotic liver portal hypertension splenomegaly varices large volume ascites, diffuse wall thickening ascending transverse descending colon, 4.8 cm left ovarian hemorrhagic cyst and cholelithiasis Last INR: 10/11/2023 1.6   Discussed the use of AI scribe software for clinical note transcription with the patient, who gave verbal consent to proceed.  History of Present Illness   Laurie Sutton is a 43 year old female with decompensated cirrhosis who presents with persistent nausea.  She was recently hospitalized from June 25th to June 27th for diarrhea, fever, and decompensated cirrhosis. During her hospital stay, she was diagnosed with C. diff and Campylobacter infections and was treated with Dificid  for C. diff. Her bowel movements have returned to regular stools, and she is not currently experiencing diarrhea.  She experiences persistent nausea, which worsens after eating, particularly after consuming a small bowl of soup. She has difficulty eating large amounts of food and describes feeling bloated, distinct from the fluid accumulation in her abdomen. She has not vomited but experiences increased salivation when feeling close to vomiting. She has been off Mounjaro  for two weeks, which she believes has contributed to her elevated blood sugar levels.  She reports frequent urination and is currently taking spironolactone  100 mg and furosemide  80 mg. She is unsure about her spironolactone  dosage but confirms taking two tablets. She is also due to start potassium supplementation. She has a history of migraines and was previously on propranolol , which was discontinued during her hospital  stay.  She has been taking lactulose  30 ml twice a day for her liver condition but is not experiencing the desired frequency of bowel movements. She experiences chills that are manageable with a sweater. No urinary issues such as burning. She has been monitoring her weight, noting a decrease from 210 lbs to 194 lbs.  No yellowing of the eyes or skin, but she reports burning eyes. She feels weak and unstable on her feet, with chills but no fever. She has been experiencing headaches since discontinuing propranolol , which she was taking for migraine prevention.     Hepatitis immunity status:  03/20/2021 HepA REACTIVE  03/20/2021 HepBsAG NON-REACTIVE  03/20/2021 HepAsAB NON-REACTIVE  03/20/2021 HepCAB NON-REACTIVE   Social history:  She  reports that she has never smoked. She has been exposed to tobacco smoke. She has never used smokeless tobacco. She reports that she does not drink alcohol and does not use drugs.   RELEVANT GI HISTORY, LABS, IMAGING:  CBC    Component Value Date/Time   WBC 2.7 (L) 10/11/2023 0510   RBC 2.95 (L) 10/11/2023 0510   HGB 9.6 (L) 10/11/2023 0510   HGB 13.0 03/09/2023 1436   HCT 28.1 (L) 10/11/2023 0510   PLT 69 (L) 10/11/2023 0510   PLT 66 (L) 03/09/2023 1436   MCV 95.3 10/11/2023 0510   MCH 32.5 10/11/2023 0510   MCHC 34.2 10/11/2023 0510   RDW 15.6 (H) 10/11/2023 0510   LYMPHSABS 0.3 (L) 10/10/2023 1554   MONOABS 0.2 10/10/2023 1554   EOSABS 0.0 10/10/2023 1554   BASOSABS 0.1 10/10/2023  1554   Recent Labs    06/22/23 0220 07/26/23 1135 10/04/23 2002 10/04/23 2208 10/05/23 0459 10/06/23 0552 10/07/23 0717 10/10/23 1504 10/10/23 1554 10/11/23 0510  HGB 10.5* 12.6 10.7* 9.2* 9.7* 9.8* 10.4* 10.5* 11.5* 9.6*    CMP     Component Value Date/Time   NA 133 (L) 10/11/2023 0510   NA 138 08/19/2017 0000   K 3.2 (L) 10/11/2023 0510   CL 102 10/11/2023 0510   CO2 26 10/11/2023 0510   GLUCOSE 184 (H) 10/11/2023 0510   BUN 6 10/11/2023 0510   BUN  8 08/19/2017 0000   CREATININE 0.64 10/11/2023 0510   CREATININE 0.54 03/09/2023 1436   CREATININE 0.42 (L) 10/29/2021 0848   CALCIUM 7.8 (L) 10/11/2023 0510   PROT 5.9 (L) 10/11/2023 0510   ALBUMIN  2.3 (L) 10/11/2023 0510   AST 40 10/11/2023 0510   AST 76 (H) 03/09/2023 1436   ALT 28 10/11/2023 0510   ALT 47 (H) 03/09/2023 1436   ALKPHOS 82 10/11/2023 0510   BILITOT 2.5 (H) 10/11/2023 0510   BILITOT 1.7 (H) 03/09/2023 1436   GFRNONAA >60 10/11/2023 0510   GFRNONAA >60 03/09/2023 1436   GFRAA >60 08/09/2019 0459      Latest Ref Rng & Units 10/11/2023    5:10 AM 10/10/2023    3:04 PM 10/07/2023    7:17 AM  Hepatic Function  Total Protein 6.5 - 8.1 g/dL 5.9  6.1  6.4   Albumin  3.5 - 5.0 g/dL 2.3  2.5  2.8   AST 15 - 41 U/L 40  49  60   ALT 0 - 44 U/L 28  31  34   Alk Phosphatase 38 - 126 U/L 82  95  84   Total Bilirubin 0.0 - 1.2 mg/dL 2.5  2.6  2.0       Latest Ref Rng & Units 03/20/2021    4:08 PM 03/23/2022    9:19 AM 04/20/2023    3:42 PM  Hepatitis C  AFP ng/mL 3.4  5.8  7.5     Current Medications:   Current Outpatient Medications (Endocrine & Metabolic):    HUMULIN  R U-500 KWIKPEN 500 UNIT/ML KwikPen, Use 120 units at breakfast, 100 at lunch, and 100 at dinner (Patient taking differently: Use 60 units at breakfast, 65 at lunch, and 65 at dinner)   tirzepatide  (MOUNJARO ) 7.5 MG/0.5ML Pen, Inject 7.5 mg Subcutaneous once a week 30 days  Current Outpatient Medications (Cardiovascular):    furosemide  (LASIX ) 80 MG tablet, Take 1 tablet (80 mg total) by mouth daily.   spironolactone  (ALDACTONE ) 100 MG tablet, Take 2 tablets (200 mg total) by mouth daily.  Current Outpatient Medications (Respiratory):    albuterol  (VENTOLIN  HFA) 108 (90 Base) MCG/ACT inhaler, INHALE 2 PUFFS INTO THE LUNGS UP TO EVERY 6 HOURS AS NEEDED FOR WHEEZING/SHORTNESS OF BREATH   montelukast  (SINGULAIR ) 10 MG tablet, Take 1 tablet (10 mg total) by mouth at bedtime.  Current Outpatient Medications  (Analgesics):    acetaminophen  (TYLENOL ) 325 MG tablet, Take 2 tablets (650 mg total) by mouth every 6 (six) hours as needed for mild pain (pain score 1-3) or fever (or Fever >/= 101).   Current Outpatient Medications (Other):    clotrimazole -betamethasone  (LOTRISONE ) cream, APPLY 1 APPLICATION TOPICALLY TWICE A DAY AS NEEDED   Continuous Glucose Sensor (DEXCOM G7 SENSOR) MISC, Use 1 sensor for continuous glucose monitoring every 10 days for 30 days   FLUoxetine  (PROZAC ) 40 MG capsule, TAKE 1 CAPSULE  BY MOUTH TWICE A DAY   lactulose  (CHRONULAC ) 10 GM/15ML solution, Take 30 mLs (20 g total) by mouth 2 (two) times daily.   omeprazole  (PRILOSEC) 40 MG capsule, TAKE 1 CAPSULE (40 MG TOTAL) BY MOUTH IN THE MORNING   ondansetron  (ZOFRAN ) 4 MG tablet, Take 1 tablet (4 mg total) by mouth every 8 (eight) hours as needed for nausea or vomiting.   ondansetron  (ZOFRAN -ODT) 4 MG disintegrating tablet, 1 tablet on the tongue and allow to dissolve by mouth three times a day; Duration: 30 days   potassium chloride  SA (KLOR-CON  M) 20 MEQ tablet, Take 1 tablet (20 mEq total) by mouth daily.  Medical History:  Past Medical History:  Diagnosis Date   ADHD    Allergy    Anemia    IRON TRANSFUSION 07-2020 NONE SINCE   Anxiety    Back pain    COVID 08/12/2019   ALL SYMPTOMS REOLVED PER PT   Depression    Diabetic neuropathy (HCC) 11/11/2020   FEET   dm type 2    sees Dr. Odella Jacobson at [facility]   Fatty liver    GERD (gastroesophageal reflux disease)    History of kidney stones    Joint pain    Menorrhagia 11/11/2020   Migraines    Murmur, cardiac    FAINT NO CARDIOLOGIST   Other fatigue    Pneumonia 08/12/2019   COVID PNEUMONIA ALL SYMPTOMS RESOLVED PER PT   Shortness of breath on exertion    Allergies:  Allergies  Allergen Reactions   Vancomycin  Itching   Amoxicillin Hives and Itching   Azithromycin      DOES NOT WORK   Dulaglutide  Other (See Comments)    constipation and  stomach issues  **Trulicity **    Empagliflozin -Metformin  Hcl Er Other (See Comments) and Swelling   Januvia  [Sitagliptin ] Other (See Comments)    HURTS STOAMCH   Latex     IRRITATES VAGINAL AREA   Metformin      Other Reaction(s): achy all over   Penicillins Hives    Has patient had a PCN reaction causing immediate rash, facial/tongue/throat swelling, SOB or lightheadedness with hypotension: yes. Rash  Has patient had a PCN reaction causing severe rash involving mucus membranes or skin necrosis: Yes- rash and hives all over body  Has patient had a PCN reaction that required hospitalization No Has patient had a PCN reaction occurring within the last 10 years: Yes  If all of the above answers are NO, then may proceed with Cephalosporin use.      Surgical History:  She  has a past surgical history that includes Cesarean section (12/13/2006); Extracorporeal shock wave lithotripsy (2018); Foot surgery (Right, 10/02/2020); Laparoscopic vaginal hysterectomy with salpingectomy (Bilateral, 11/17/2020); IR Paracentesis (07/06/2023); IR Transcatheter BX (07/26/2023); IR Venogram Hepatic W Hemodynamic Evaluation (07/26/2023); IR US  Guide Vasc Access Right (07/26/2023); Colonoscopy with propofol  (N/A, 08/02/2023); Esophagogastroduodenoscopy (egd) with propofol  (N/A, 08/02/2023); Polypectomy (08/02/2023); IR Paracentesis (08/18/2023); IR Paracentesis (10/05/2023); and IR Paracentesis (10/11/2023). Family History:  Her family history includes Anxiety disorder in her daughter, father, mother, son, and another family member; Asthma in her daughter and son; Bladder Cancer (age of onset: 108) in her father; Cerebral aneurysm in her cousin; Colon polyps in her father and mother; Depression in her father, mother, and another family member; Diabetes in her father and maternal grandfather; Healthy in her daughter and son; Heart disease in an other family member; Migraines in her maternal aunt and maternal grandmother; Rheum  arthritis in  her father; Sleep apnea in her father and another family member.  REVIEW OF SYSTEMS  : All other systems reviewed and negative except where noted in the History of Present Illness.  PHYSICAL EXAM: BP 118/68   Pulse 85   Ht 5' 6 (1.676 m)   Wt 197 lb (89.4 kg)   LMP 11/13/2020 (Exact Date)   BMI 31.80 kg/m  General :  Alert, well developed female in no acute distress Head:  Normocephalic and atraumatic. Eyes :  sclerae anicteric,conjunctive pink  Heart:  regular rate and rhythm, no murmurs or gallops Pulm:  Clear anteriorly; no wheezing Abdomen:   Soft, Obese AB, Normal bowel sounds. mild tenderness in the entire abdomen. Without guarding and Without rebound. positive  fluid wave, positive  shifting dullness.  Extremities:   Without edema. Msk:  Symmetrical without gross deformities. Peripheral pulses intact.  Neurologic: Alert and  oriented x4;  grossly normal neurologically. with asterixis or clonus.  Skin:   without jaundice. no palmar erythema or spider angioma.   Psychiatric:  Demonstrates good judgement and reason without abnormal affect or behaviors.   Alan JONELLE Coombs, PA-C 3:38 PM

## 2023-10-18 ENCOUNTER — Other Ambulatory Visit: Payer: Self-pay

## 2023-10-18 ENCOUNTER — Ambulatory Visit: Admitting: Physician Assistant

## 2023-10-18 ENCOUNTER — Encounter: Payer: Self-pay | Admitting: Physician Assistant

## 2023-10-18 VITALS — BP 118/68 | HR 85 | Ht 66.0 in | Wt 197.0 lb

## 2023-10-18 DIAGNOSIS — K7460 Unspecified cirrhosis of liver: Secondary | ICD-10-CM

## 2023-10-18 DIAGNOSIS — Z7985 Long-term (current) use of injectable non-insulin antidiabetic drugs: Secondary | ICD-10-CM

## 2023-10-18 DIAGNOSIS — K766 Portal hypertension: Secondary | ICD-10-CM

## 2023-10-18 DIAGNOSIS — E119 Type 2 diabetes mellitus without complications: Secondary | ICD-10-CM

## 2023-10-18 DIAGNOSIS — R188 Other ascites: Secondary | ICD-10-CM | POA: Diagnosis not present

## 2023-10-18 DIAGNOSIS — G43001 Migraine without aura, not intractable, with status migrainosus: Secondary | ICD-10-CM

## 2023-10-18 DIAGNOSIS — R110 Nausea: Secondary | ICD-10-CM

## 2023-10-18 DIAGNOSIS — K7682 Hepatic encephalopathy: Secondary | ICD-10-CM

## 2023-10-18 DIAGNOSIS — I8510 Secondary esophageal varices without bleeding: Secondary | ICD-10-CM

## 2023-10-18 DIAGNOSIS — Z8619 Personal history of other infectious and parasitic diseases: Secondary | ICD-10-CM

## 2023-10-18 DIAGNOSIS — K7290 Hepatic failure, unspecified without coma: Secondary | ICD-10-CM

## 2023-10-18 DIAGNOSIS — K5909 Other constipation: Secondary | ICD-10-CM | POA: Diagnosis not present

## 2023-10-18 DIAGNOSIS — I8500 Esophageal varices without bleeding: Secondary | ICD-10-CM

## 2023-10-18 MED ORDER — ONDANSETRON HCL 4 MG PO TABS: 4.0000 mg | ORAL_TABLET | Freq: Three times a day (TID) | ORAL | 1 refills | Status: DC | PRN

## 2023-10-18 NOTE — Patient Instructions (Addendum)
 VISIT SUMMARY:  During your visit, we discussed your ongoing symptoms and recent medical history, including your recent hospitalization for diarrhea, fever, and decompensated cirrhosis. We reviewed your persistent nausea, frequent urination, and elevated blood sugar levels. We also addressed your medication regimen and made some adjustments to better manage your symptoms.  YOUR PLAN:  -DECOMPENSATED CIRRHOSIS: Decompensated cirrhosis is a severe stage of liver disease where the liver is significantly damaged and unable to function properly. We will refer you for a paracentesis to manage the fluid buildup in your abdomen, check your kidney function and ammonia levels, and increase your lactulose  dosage to three times a day to help with bowel movements.  -ASCITES: Ascites is the accumulation of fluid in the abdomen, often due to liver disease. We will refer you for a paracentesis to help manage this fluid buildup and monitor your weight and fluid status closely.  -HEPATIC ENCEPHALOPATHY: Hepatic encephalopathy is a decline in brain function due to severe liver disease, which can lead to confusion and other symptoms. We will check your ammonia levels and increase your lactulose  dosage to three times a day to help manage this condition.  -NAUSEA: Your persistent nausea may be related to your ascites and recent medication changes. We will prescribe Zofran  to help manage your nausea, but please be aware that it may cause constipation.  -CLOSTRIDIUM DIFFICILE COLITIS: Clostridium difficile colitis is an infection of the colon that can cause severe diarrhea and other symptoms. Your recent infection has been treated and resolved, but you are at increased risk of recurrence. We provided you with information on how to prevent future infections.  -FREQUENT URINATION: Your frequent urination is likely related to your use of diuretics, which help remove excess fluid from your body.  -HYPERGLYCEMIA: Hyperglycemia  is elevated blood sugar levels. Your elevated levels may be due to pausing your Mounjaro  medication. We advise against restarting Mounjaro  until further evaluation.  INSTRUCTIONS:  Please follow up with the recommended paracentesis and lab tests to check your kidney function and ammonia levels. Monitor your bowel movements and adjust your lactulose  dosage as needed. Continue taking Zofran  for nausea, but be mindful of potential constipation. Avoid restarting Mounjaro  until we have further evaluated your condition.  You have been scheduled for an abdominal paracentesis at [facility] radiology (1st floor of hospital) on 10/20/23 at 9:00 am. Please arrive at least 30 minutes prior to your appointment time for registration. Should you need to reschedule this appointment for any reason, please call our office at [phone].   TYLENOL  (ACETAMINOPHEN ) IS SAFE IN LIVER DISEASE: You can take tylenol  (acetaminophen ) up to 2,000 mg/day. This would be four extra strength (500mg ) tablets over 24 hours OR six regular strength (325 mg) tablets over 24 hours. Please be sure to read the ingredients of over the counter medications and prescription pain medications as many contain acetaminophen .   NO NSAIDS (ibuprofen , advil , naproxen, aleve, motrin ...)   Thank you for trusting me with your gastrointestinal care!   Alan Coombs, PA-C  _______________________________________________________  If your blood pressure at your visit was 140/90 or greater, please contact your primary care physician to follow up on this.  _______________________________________________________  If you are age 28 or older, your body mass index should be between 23-30. Your Body mass index is 31.8 kg/m. If this is out of the aforementioned range listed, please consider follow up with your Primary Care Provider.  If you are age 16 or younger, your body mass index should be between 19-25.  Your Body mass index is 31.8 kg/m. If this  is out of the aformentioned range listed, please consider follow up with your Primary Care Provider.   ________________________________________________________  The [facility] providers would like to encourage you to use MYCHART to communicate with providers for non-urgent requests or questions.  Due to long hold times on the telephone, sending your provider a message by [facility] may be a faster and more efficient way to get a response.  Please allow 48 business hours for a response.  Please remember that this is for non-urgent requests.  _______________________________________________________    HEPATIC ENCEPHALOPATHY HEPATIC ENCEPHALOPATHY: Confusion caused by a build up of toxins in the blood due to the liver not being able to filter toxins. This can cause confusion mild or severe, increase falls. If you have been diagnosed with Hepatic encephalopathy, advised to not drive due to increased risk   LACTULOSE :  helps pull ammonia and other toxins from the blood into your stool when you have a bowel movement NO NEED TO CHECK AMMONIA LEVEL IN BLOOD! Only need to check if you are having symptoms. 30-17ml up to four times a day Take a dose in the morning If by lunch time you have not had AT LEAST 2 bowel movements take another dose If by dinner you have not had AT LEAST 2 bowel movements that day take another dose If by bedtime you still have not had 2 bowel movements take another dose Goal of 3-4 bowel movements per day Avoid taking with food as this will cause more gas  IF YOU ARE VERY SLEEPY, HARD FOR YOUR FAMILY TO WAKE YOU UP, OR FALLING ASLEEP DURING CONVERSATIONS -INCREASE LACTULOSE /GO TO THE EMERGENCY ROOM  XIFAXAN (rifaximin) An antibiotic that helps limit excess bacteria in the intestine.  The bacteria can cause increased ammonia One 550mg  tablet twice daily  SUPPLEMENTS: multivitamin Zinc Branch Chain Amino acids (BCAA): 12 grams/day L-Ornithine L-Aspartate (LOLA): 6 grams  three times/day ([email or web address]) L-Carnitine  PHYSICAL ACTIVITY It is important to continue to be active when you have cirrhosis. Exercise will help reduce muscle loss and weakness.  DISCUSS REFERRAL FOR PHYSICAL THERAPY WITH YOUR PRIMARY CARE PROVIDER  DIET/NUTRITION FOR CIRRHOSIS NO ALCOHOL YOUR GOALS Evening snack - high protein Supplements between meals to help meet calorie and protein goal: Boost Ensure Premier Protein Shakes Protein Greek yogurt Fish, chicken (NO RAW OR UNDERCOOKED FISH/SHELLFISH) Avoid pork and red meat Plant based protein (non-soy)/Vegan: Lentils, Chickpeas, Peanuts (non salted), almonds (non salted), quinoa, chia seeds  Plant based protein supplements (not soy)  Avoid/limit animal based protein supplements: whey, casein 4.  Low sodium (2,000 mg/day) A. Avoid: table salt, canned foods, deli meats, sausages, hot dogs, anything with a long shelf life B. Read nutrition labels and be aware of serving size. Don't go by percent of daily value.    C. Diff Infection C. diff infection, or C. diff, is caused by germs (bacteria) called Clostridioides difficile. These germs cause diarrhea and very bad inflammation in your colon. This infection often happens after taking antibiotics. C. diff can spread easily to others, usually from touching or swallowing something that has the germ on it. What are the causes? Common causes of C. diff include: Taking antibiotics. Coming in contact with people, food, or things that have the germ on it. What increases the risk? You may be more likely to get C. diff if you: Take antibiotics that kill many types of germs or take antibiotics for a long time. Stay  in a hospital or nursing home for a long time. Are age 69 or older. Have had it before or have been in contact with C. diff germs. Have a weak disease-fighting, or immune, system. Have a serious health condition, such as colon cancer or inflammatory bowel disease  (IBD). Take medicines that treat stomach acid. Have had a surgery on your digestive system. What are the signs or symptoms? Diarrhea at least 3 times a day for many days. Fever. Nausea. Not feeling hungry. Swelling, pain, cramping, or tenderness in the belly, or abdomen. How is this diagnosed? C. diff is diagnosed with: Your medical history and a physical exam. Tests such as: A test that checks your poop (stool) for C. diff. Blood tests. Imaging tests of your intestines, such as a CT scan or X-ray. A procedure to look at your colon. This is rare. How is this treated? Treatment for C. diff may include: Stopping the antibiotics that caused C. diff. Taking antibiotics that kill C. diff. Placing poop from a healthy person into your colon. This is called a fecal transplant. It may be done if C. diff keeps coming back. Having surgery to take out the infected part of the colon. This is rare. Follow these instructions at home: Medicines Take over-the-counter and prescription medicines only as told by your health care provider. Take your antibiotics as told by your provider. Do not stop taking your antibiotics even if you start to feel better. Do not take medicines that treat diarrhea unless your provider tells you to. Eating and drinking Follow instructions from your provider about what you may eat or drink. Eat bland foods in small amounts that are easy to digest. These include bananas, applesauce, rice, and toast. Avoid milk, caffeine , and alcohol. Replace any body fluid that has been lost. You can do this by: Drinking clear fluids to keep your pee (urine) pale yellow. This includes water, clear fruit juice with water added to it, and low-calorie sports drinks. Sucking on ice chips. Taking an oral rehydration solution (ORS). This drink is sold at pharmacies and retail stores. General instructions Wash your hands often with soap and water for at least 20 seconds. Take a bath or shower  every day. Return to your normal activities as told by your provider. Ask your provider what activities are safe for you. Be sure your home is clean before you leave the hospital or clinic. Clean every day for at least a week. Keep all follow-up visits to make sure the infection is gone. How is this prevented? Wash [employer] your hands with soap and water for at least 20 seconds. Wash your hands before cooking and after using the bathroom. Other people should wash their hands too, especially: People who live with you. People who visit you. Stop germs from spreading Tell your provider right away if you get diarrhea while in a hospital or nursing home. When you visit someone in a hospital or nursing home, wear a gown, gloves, or other protection. Try to stay away from people who have diarrhea. Try to use a different bathroom if you're sick and live with other people. Clean surfaces Clean surfaces that you touch every day. Use a product that has a 10% chlorine bleach solution. Be sure to: Read the product label to make sure it will kill germs. Clean toilets and flush handles, bathtubs, sinks, doorknobs and handles, countertops, and work surfaces. If you're in the hospital, make sure the surfaces in your room are cleaned each  day. Tell someone right away if body fluids have splashed or spilled. Washing clothes and linens Wash clothes and linens using laundry soap that has chlorine bleach. Be sure to: Use powder soap instead of liquid. Clean your washing machine once a month. To do this, turn on the hot setting with only soap in it. Contact a health care provider if: Your symptoms do not get better or get worse. Your symptoms go away and then come back. You have a fever. You have new symptoms. Get help right away if: You have more pain or tenderness in your belly. You have poop that's bloody or looks black and tarry. You vomit every time you eat or drink. You have signs of not having  enough fluids in your body. These include: Dark yellow pee, very little pee, or no pee. Cracked lips or dry mouth. No tears when you cry. Sunken eyes. Feeling tired. Feeling weak or dizzy. This information is not intended to replace advice given to you by your health care provider. Make sure you discuss any questions you have with your health care provider. Document Revised: 06/21/2022 Document Reviewed: 06/21/2022 Elsevier Patient Education  2024 [employer].

## 2023-10-19 ENCOUNTER — Inpatient Hospital Stay: Admitting: Family Medicine

## 2023-10-19 ENCOUNTER — Encounter: Admitting: Podiatry

## 2023-10-20 ENCOUNTER — Other Ambulatory Visit (INDEPENDENT_AMBULATORY_CARE_PROVIDER_SITE_OTHER)

## 2023-10-20 ENCOUNTER — Ambulatory Visit: Payer: Self-pay | Admitting: Physician Assistant

## 2023-10-20 ENCOUNTER — Ambulatory Visit (HOSPITAL_COMMUNITY)
Admission: RE | Admit: 2023-10-20 | Discharge: 2023-10-20 | Disposition: A | Discharge status: Home / Self Care | Source: Ambulatory Visit | Attending: Physician Assistant | Admitting: Physician Assistant

## 2023-10-20 ENCOUNTER — Other Ambulatory Visit (HOSPITAL_COMMUNITY)

## 2023-10-20 DIAGNOSIS — K7581 Nonalcoholic steatohepatitis (NASH): Secondary | ICD-10-CM | POA: Diagnosis not present

## 2023-10-20 DIAGNOSIS — R188 Other ascites: Secondary | ICD-10-CM | POA: Insufficient documentation

## 2023-10-20 DIAGNOSIS — K7460 Unspecified cirrhosis of liver: Secondary | ICD-10-CM | POA: Diagnosis present

## 2023-10-20 DIAGNOSIS — K7682 Hepatic encephalopathy: Secondary | ICD-10-CM

## 2023-10-20 HISTORY — PX: IR PARACENTESIS: IMG2679

## 2023-10-20 LAB — CBC WITH DIFFERENTIAL/PLATELET
Basophils Absolute: 0 K/uL (ref 0.0–0.1)
Basophils Relative: 0.9 % (ref 0.0–3.0)
Eosinophils Absolute: 0.1 K/uL (ref 0.0–0.7)
Eosinophils Relative: 5.7 % — ABNORMAL HIGH (ref 0.0–5.0)
HCT: 31.3 % — ABNORMAL LOW (ref 36.0–46.0)
Hemoglobin: 10.8 g/dL — ABNORMAL LOW (ref 12.0–15.0)
Lymphocytes Relative: 18.5 % (ref 12.0–46.0)
Lymphs Abs: 0.5 K/uL — ABNORMAL LOW (ref 0.7–4.0)
MCHC: 34.6 g/dL (ref 30.0–36.0)
MCV: 95.3 fl (ref 78.0–100.0)
Monocytes Absolute: 0.4 K/uL (ref 0.1–1.0)
Monocytes Relative: 14.8 % — ABNORMAL HIGH (ref 3.0–12.0)
Neutro Abs: 1.6 K/uL (ref 1.4–7.7)
Neutrophils Relative %: 60.1 % (ref 43.0–77.0)
Platelets: 63 K/uL — ABNORMAL LOW (ref 150.0–400.0)
RBC: 3.28 Mil/uL — ABNORMAL LOW (ref 3.87–5.11)
RDW: 16.7 % — ABNORMAL HIGH (ref 11.5–15.5)
WBC: 2.6 K/uL — ABNORMAL LOW (ref 4.0–10.5)

## 2023-10-20 LAB — BASIC METABOLIC PANEL WITH GFR
BUN: 7 mg/dL (ref 6–23)
CO2: 28 meq/L (ref 19–32)
Calcium: 8.8 mg/dL (ref 8.4–10.5)
Chloride: 98 meq/L (ref 96–112)
Creatinine, Ser: 0.68 mg/dL (ref 0.40–1.20)
GFR: 107.21 mL/min (ref >60.00)
Glucose, Bld: 126 mg/dL — ABNORMAL HIGH (ref 70–99)
Potassium: 3.4 meq/L — ABNORMAL LOW (ref 3.5–5.1)
Sodium: 134 meq/L — ABNORMAL LOW (ref 135–145)

## 2023-10-20 LAB — PROTIME-INR
INR: 1.9 ratio — ABNORMAL HIGH (ref 0.8–1.0)
Prothrombin Time: 19.2 s — ABNORMAL HIGH (ref 9.6–13.1)

## 2023-10-20 LAB — GRAM STAIN: Gram Stain: NONE SEEN

## 2023-10-20 LAB — HEPATIC FUNCTION PANEL
ALT: 23 U/L (ref 0–35)
AST: 35 U/L (ref 0–37)
Albumin: 3.5 g/dL (ref 3.5–5.2)
Alkaline Phosphatase: 92 U/L (ref 39–117)
Bilirubin, Direct: 1.1 mg/dL — ABNORMAL HIGH (ref 0.0–0.3)
Total Bilirubin: 3.9 mg/dL — ABNORMAL HIGH (ref 0.2–1.2)
Total Protein: 7 g/dL (ref 6.0–8.3)

## 2023-10-20 LAB — AMMONIA: Ammonia: 60 umol/L — ABNORMAL HIGH (ref 11–35)

## 2023-10-20 LAB — BODY FLUID CELL COUNT WITH DIFFERENTIAL
Eos, Fluid: 1 %
Lymphs, Fluid: 40 %
Monocyte-Macrophage-Serous Fluid: 56 % (ref 50–90)
Neutrophil Count, Fluid: 3 % (ref 0–25)
Total Nucleated Cell Count, Fluid: 249 uL (ref 0–1000)

## 2023-10-20 LAB — MAGNESIUM: Magnesium: 1.6 mg/dL (ref 1.5–2.5)

## 2023-10-20 MED ORDER — ALBUMIN HUMAN 25 % IV SOLN
INTRAVENOUS | Status: AC
  Filled 2023-10-20: qty 100

## 2023-10-20 MED ORDER — ALBUMIN HUMAN 25 % IV SOLN
25.0000 g | Freq: Once | INTRAVENOUS | Status: AC
  Administered 2023-10-20: 25 g via INTRAVENOUS

## 2023-10-20 MED ORDER — LIDOCAINE HCL 1 % IJ SOLN
10.0000 mL | Freq: Once | INTRAMUSCULAR | Status: AC
  Administered 2023-10-20: 10 mL via INTRADERMAL

## 2023-10-20 NOTE — Progress Notes (Signed)
 ____________________________________________________________  Attending physician addendum:  Thank you for sending this case to me. I have reviewed the entire note and agree with the plan.  Excellent comprehensive review on a complex patient.  Regarding her beta-blockers for variceal prophylaxis, I think the decision on resuming that would be best left to the hepatology clinic when she sees them soon.  Carvedilol has become the preferred nonselective beta-blocker for this indication, though perhaps propranolol  could still be used in her case and she has another indication for that medicine.  Victory Brand, MD  ____________________________________________________________

## 2023-10-20 NOTE — Procedures (Signed)
 PROCEDURE SUMMARY:  Successful image-guided paracentesis from the left lower abdomen.  Yielded 4.4 liters of clear, yellow fluid.  No immediate complications.  EBL = trace. Patient tolerated well.   Specimen was sent for labs.  Please see imaging section of Epic for full dictation.  Lavanda JAYSON Jurist PA-C 10/20/2023 10:25 AM

## 2023-10-21 LAB — AFP TUMOR MARKER: AFP-Tumor Marker: 7.8 ng/mL — ABNORMAL HIGH

## 2023-10-23 ENCOUNTER — Other Ambulatory Visit: Payer: Self-pay | Admitting: Gastroenterology

## 2023-10-24 LAB — MISCELLANEOUS TEST

## 2023-10-26 ENCOUNTER — Other Ambulatory Visit (HOSPITAL_COMMUNITY): Payer: Self-pay | Admitting: Nurse Practitioner

## 2023-10-26 ENCOUNTER — Encounter: Admitting: Podiatry

## 2023-10-26 DIAGNOSIS — R188 Other ascites: Secondary | ICD-10-CM

## 2023-10-27 ENCOUNTER — Other Ambulatory Visit: Payer: Self-pay | Admitting: Nurse Practitioner

## 2023-10-27 DIAGNOSIS — D376 Neoplasm of uncertain behavior of liver, gallbladder and bile ducts: Secondary | ICD-10-CM

## 2023-10-27 DIAGNOSIS — K7469 Other cirrhosis of liver: Secondary | ICD-10-CM

## 2023-10-28 ENCOUNTER — Inpatient Hospital Stay (HOSPITAL_COMMUNITY)
Admission: EM | Admit: 2023-10-28 | Discharge: 2023-10-31 | DRG: 391 | Disposition: A | Discharge status: Home / Self Care | Attending: Internal Medicine | Admitting: Internal Medicine

## 2023-10-28 ENCOUNTER — Other Ambulatory Visit: Payer: Self-pay

## 2023-10-28 ENCOUNTER — Encounter (HOSPITAL_COMMUNITY): Payer: Self-pay | Admitting: *Deleted

## 2023-10-28 DIAGNOSIS — K529 Noninfective gastroenteritis and colitis, unspecified: Secondary | ICD-10-CM | POA: Diagnosis not present

## 2023-10-28 DIAGNOSIS — E871 Hypo-osmolality and hyponatremia: Secondary | ICD-10-CM | POA: Diagnosis present

## 2023-10-28 DIAGNOSIS — K8020 Calculus of gallbladder without cholecystitis without obstruction: Secondary | ICD-10-CM | POA: Diagnosis present

## 2023-10-28 DIAGNOSIS — F32A Depression, unspecified: Secondary | ICD-10-CM | POA: Diagnosis present

## 2023-10-28 DIAGNOSIS — I868 Varicose veins of other specified sites: Secondary | ICD-10-CM | POA: Diagnosis present

## 2023-10-28 DIAGNOSIS — D6959 Other secondary thrombocytopenia: Secondary | ICD-10-CM | POA: Diagnosis present

## 2023-10-28 DIAGNOSIS — K652 Spontaneous bacterial peritonitis: Secondary | ICD-10-CM | POA: Diagnosis present

## 2023-10-28 DIAGNOSIS — Z833 Family history of diabetes mellitus: Secondary | ICD-10-CM

## 2023-10-28 DIAGNOSIS — K7682 Hepatic encephalopathy: Secondary | ICD-10-CM

## 2023-10-28 DIAGNOSIS — D649 Anemia, unspecified: Secondary | ICD-10-CM | POA: Diagnosis present

## 2023-10-28 DIAGNOSIS — A09 Infectious gastroenteritis and colitis, unspecified: Secondary | ICD-10-CM | POA: Diagnosis not present

## 2023-10-28 DIAGNOSIS — Z825 Family history of asthma and other chronic lower respiratory diseases: Secondary | ICD-10-CM

## 2023-10-28 DIAGNOSIS — Z8616 Personal history of COVID-19: Secondary | ICD-10-CM

## 2023-10-28 DIAGNOSIS — Z91040 Latex allergy status: Secondary | ICD-10-CM

## 2023-10-28 DIAGNOSIS — K219 Gastro-esophageal reflux disease without esophagitis: Secondary | ICD-10-CM | POA: Diagnosis present

## 2023-10-28 DIAGNOSIS — K7290 Hepatic failure, unspecified without coma: Secondary | ICD-10-CM

## 2023-10-28 DIAGNOSIS — I10 Essential (primary) hypertension: Secondary | ICD-10-CM | POA: Diagnosis present

## 2023-10-28 DIAGNOSIS — R188 Other ascites: Secondary | ICD-10-CM | POA: Diagnosis present

## 2023-10-28 DIAGNOSIS — N83202 Unspecified ovarian cyst, left side: Secondary | ICD-10-CM | POA: Diagnosis present

## 2023-10-28 DIAGNOSIS — K7460 Unspecified cirrhosis of liver: Secondary | ICD-10-CM | POA: Diagnosis present

## 2023-10-28 DIAGNOSIS — F909 Attention-deficit hyperactivity disorder, unspecified type: Secondary | ICD-10-CM | POA: Diagnosis present

## 2023-10-28 DIAGNOSIS — I8510 Secondary esophageal varices without bleeding: Secondary | ICD-10-CM | POA: Diagnosis present

## 2023-10-28 DIAGNOSIS — Z888 Allergy status to other drugs, medicaments and biological substances status: Secondary | ICD-10-CM

## 2023-10-28 DIAGNOSIS — E1142 Type 2 diabetes mellitus with diabetic polyneuropathy: Secondary | ICD-10-CM | POA: Diagnosis present

## 2023-10-28 DIAGNOSIS — Z79899 Other long term (current) drug therapy: Secondary | ICD-10-CM

## 2023-10-28 DIAGNOSIS — J45909 Unspecified asthma, uncomplicated: Secondary | ICD-10-CM | POA: Diagnosis present

## 2023-10-28 DIAGNOSIS — F419 Anxiety disorder, unspecified: Secondary | ICD-10-CM | POA: Diagnosis present

## 2023-10-28 DIAGNOSIS — Z83719 Family history of colon polyps, unspecified: Secondary | ICD-10-CM

## 2023-10-28 DIAGNOSIS — Z794 Long term (current) use of insulin: Secondary | ICD-10-CM

## 2023-10-28 DIAGNOSIS — E8809 Other disorders of plasma-protein metabolism, not elsewhere classified: Secondary | ICD-10-CM | POA: Diagnosis present

## 2023-10-28 DIAGNOSIS — K7581 Nonalcoholic steatohepatitis (NASH): Secondary | ICD-10-CM | POA: Diagnosis present

## 2023-10-28 DIAGNOSIS — Z8261 Family history of arthritis: Secondary | ICD-10-CM

## 2023-10-28 DIAGNOSIS — K766 Portal hypertension: Secondary | ICD-10-CM | POA: Diagnosis present

## 2023-10-28 DIAGNOSIS — R1084 Generalized abdominal pain: Secondary | ICD-10-CM

## 2023-10-28 DIAGNOSIS — Z880 Allergy status to penicillin: Secondary | ICD-10-CM

## 2023-10-28 LAB — COMPREHENSIVE METABOLIC PANEL WITH GFR
ALT: 31 U/L (ref 0–44)
AST: 53 U/L — ABNORMAL HIGH (ref 15–41)
Albumin: 2.7 g/dL — ABNORMAL LOW (ref 3.5–5.0)
Alkaline Phosphatase: 91 U/L (ref 38–126)
Anion gap: 9 (ref 5–15)
BUN: 8 mg/dL (ref 6–20)
CO2: 20 mmol/L — ABNORMAL LOW (ref 22–32)
Calcium: 8.4 mg/dL — ABNORMAL LOW (ref 8.9–10.3)
Chloride: 102 mmol/L (ref 98–111)
Creatinine, Ser: 0.83 mg/dL (ref 0.44–1.00)
GFR, Estimated: >60 mL/min (ref >60)
Glucose, Bld: 210 mg/dL — ABNORMAL HIGH (ref 70–99)
Potassium: 3.6 mmol/L (ref 3.5–5.1)
Sodium: 131 mmol/L — ABNORMAL LOW (ref 135–145)
Total Bilirubin: 3.5 mg/dL — ABNORMAL HIGH (ref 0.0–1.2)
Total Protein: 6.7 g/dL (ref 6.5–8.1)

## 2023-10-28 LAB — URINALYSIS, ROUTINE W REFLEX MICROSCOPIC
Bilirubin Urine: NEGATIVE
Glucose, UA: NEGATIVE mg/dL
Hgb urine dipstick: NEGATIVE
Ketones, ur: NEGATIVE mg/dL
Leukocytes,Ua: NEGATIVE
Nitrite: NEGATIVE
Protein, ur: NEGATIVE mg/dL
Specific Gravity, Urine: 1.009 (ref 1.005–1.030)
pH: 7 (ref 5.0–8.0)

## 2023-10-28 LAB — CBC WITH DIFFERENTIAL/PLATELET
Abs Immature Granulocytes: 0.02 K/uL (ref 0.00–0.07)
Basophils Absolute: 0 K/uL (ref 0.0–0.1)
Basophils Relative: 1 %
Eosinophils Absolute: 0.1 K/uL (ref 0.0–0.5)
Eosinophils Relative: 2 %
HCT: 33.3 % — ABNORMAL LOW (ref 36.0–46.0)
Hemoglobin: 11.1 g/dL — ABNORMAL LOW (ref 12.0–15.0)
Immature Granulocytes: 0 %
Lymphocytes Relative: 15 %
Lymphs Abs: 0.8 K/uL (ref 0.7–4.0)
MCH: 31.6 pg (ref 26.0–34.0)
MCHC: 33.3 g/dL (ref 30.0–36.0)
MCV: 94.9 fL (ref 80.0–100.0)
Monocytes Absolute: 0.4 K/uL (ref 0.1–1.0)
Monocytes Relative: 9 %
Neutro Abs: 3.8 K/uL (ref 1.7–7.7)
Neutrophils Relative %: 73 %
Platelets: 71 K/uL — ABNORMAL LOW (ref 150–400)
RBC: 3.51 MIL/uL — ABNORMAL LOW (ref 3.87–5.11)
RDW: 15.1 % (ref 11.5–15.5)
Smear Review: DECREASED
WBC: 5.2 K/uL (ref 4.0–10.5)
nRBC: 0 % (ref 0.0–0.2)

## 2023-10-28 LAB — LIPASE, BLOOD: Lipase: 51 U/L (ref 11–51)

## 2023-10-28 MED ORDER — ONDANSETRON 4 MG PO TBDP: 4.0000 mg | ORAL_TABLET | Freq: Once | ORAL | Status: DC

## 2023-10-28 NOTE — ED Triage Notes (Signed)
 Pt via POV c/o sharp abdominal pain with diarrhea and fever up to 101.4 since last night. Recently treated for c diff. Estimates 7-9 episodes of diarrhea in the last 24 hours.

## 2023-10-28 NOTE — ED Provider Triage Note (Signed)
 Emergency Medicine Provider Triage Evaluation Note  SHARELLE BURDITT , a 43 y.o. female  was evaluated in triage.  Pt complains of abdominal pain.  Review of Systems  Positive:  Negative:   Physical Exam  BP 112/62 (BP Location: Right Arm)   Pulse 96   Temp 98.6 F (37 C)   Resp 18   Ht 5' 6 (1.676 m)   Wt 89.4 kg   LMP 11/13/2020 (Exact Date)   SpO2 99%   BMI 31.81 kg/m  Gen:   Awake, no distress   Resp:  Normal effort  MSK:   Moves extremities without difficulty  Other:    Medical Decision Making  Medically screening exam initiated at 7:30 PM.  Appropriate orders placed.  LEELAH HANNA was informed that the remainder of the evaluation will be completed by another provider, this initial triage assessment does not replace that evaluation, and the importance of remaining in the ED until their evaluation is complete.  Patient diagnosed with C. difficile 2 weeks ago.  Was on treatment for this for 1 week.  Patient stating her symptoms were getting better until last night.  Patient now endorsing generalized abdominal pain, diarrhea, and nausea.  Also endorsing fever of 101.44F.   Hoy Nidia FALCON, [place] 10/28/23 1931

## 2023-10-29 ENCOUNTER — Inpatient Hospital Stay (HOSPITAL_COMMUNITY)

## 2023-10-29 ENCOUNTER — Emergency Department (HOSPITAL_COMMUNITY)

## 2023-10-29 DIAGNOSIS — I10 Essential (primary) hypertension: Secondary | ICD-10-CM | POA: Diagnosis present

## 2023-10-29 DIAGNOSIS — R188 Other ascites: Secondary | ICD-10-CM | POA: Diagnosis present

## 2023-10-29 DIAGNOSIS — Z888 Allergy status to other drugs, medicaments and biological substances status: Secondary | ICD-10-CM | POA: Diagnosis not present

## 2023-10-29 DIAGNOSIS — I8510 Secondary esophageal varices without bleeding: Secondary | ICD-10-CM | POA: Diagnosis present

## 2023-10-29 DIAGNOSIS — K7581 Nonalcoholic steatohepatitis (NASH): Secondary | ICD-10-CM

## 2023-10-29 DIAGNOSIS — K652 Spontaneous bacterial peritonitis: Secondary | ICD-10-CM | POA: Diagnosis present

## 2023-10-29 DIAGNOSIS — K7290 Hepatic failure, unspecified without coma: Secondary | ICD-10-CM | POA: Diagnosis not present

## 2023-10-29 DIAGNOSIS — Z833 Family history of diabetes mellitus: Secondary | ICD-10-CM | POA: Diagnosis not present

## 2023-10-29 DIAGNOSIS — Z8616 Personal history of COVID-19: Secondary | ICD-10-CM | POA: Diagnosis not present

## 2023-10-29 DIAGNOSIS — K7460 Unspecified cirrhosis of liver: Secondary | ICD-10-CM | POA: Diagnosis present

## 2023-10-29 DIAGNOSIS — K7682 Hepatic encephalopathy: Secondary | ICD-10-CM | POA: Diagnosis present

## 2023-10-29 DIAGNOSIS — F32A Depression, unspecified: Secondary | ICD-10-CM | POA: Diagnosis present

## 2023-10-29 DIAGNOSIS — E1142 Type 2 diabetes mellitus with diabetic polyneuropathy: Secondary | ICD-10-CM | POA: Diagnosis present

## 2023-10-29 DIAGNOSIS — Z91040 Latex allergy status: Secondary | ICD-10-CM | POA: Diagnosis not present

## 2023-10-29 DIAGNOSIS — A09 Infectious gastroenteritis and colitis, unspecified: Secondary | ICD-10-CM | POA: Diagnosis present

## 2023-10-29 DIAGNOSIS — D649 Anemia, unspecified: Secondary | ICD-10-CM | POA: Diagnosis present

## 2023-10-29 DIAGNOSIS — D6959 Other secondary thrombocytopenia: Secondary | ICD-10-CM | POA: Diagnosis present

## 2023-10-29 DIAGNOSIS — K529 Noninfective gastroenteritis and colitis, unspecified: Secondary | ICD-10-CM | POA: Diagnosis present

## 2023-10-29 DIAGNOSIS — Z794 Long term (current) use of insulin: Secondary | ICD-10-CM | POA: Diagnosis not present

## 2023-10-29 DIAGNOSIS — E871 Hypo-osmolality and hyponatremia: Secondary | ICD-10-CM | POA: Diagnosis present

## 2023-10-29 DIAGNOSIS — Z880 Allergy status to penicillin: Secondary | ICD-10-CM | POA: Diagnosis not present

## 2023-10-29 DIAGNOSIS — Z79899 Other long term (current) drug therapy: Secondary | ICD-10-CM | POA: Diagnosis not present

## 2023-10-29 DIAGNOSIS — K766 Portal hypertension: Secondary | ICD-10-CM | POA: Diagnosis present

## 2023-10-29 DIAGNOSIS — E8809 Other disorders of plasma-protein metabolism, not elsewhere classified: Secondary | ICD-10-CM | POA: Diagnosis present

## 2023-10-29 DIAGNOSIS — R1084 Generalized abdominal pain: Secondary | ICD-10-CM | POA: Diagnosis not present

## 2023-10-29 DIAGNOSIS — J45909 Unspecified asthma, uncomplicated: Secondary | ICD-10-CM | POA: Diagnosis present

## 2023-10-29 LAB — CBG MONITORING, ED: Glucose-Capillary: 262 mg/dL — ABNORMAL HIGH (ref 70–99)

## 2023-10-29 LAB — LACTATE DEHYDROGENASE, PLEURAL OR PERITONEAL FLUID: LD, Fluid: 33 U/L — ABNORMAL HIGH (ref 3–23)

## 2023-10-29 LAB — GLUCOSE, CAPILLARY
Glucose-Capillary: 260 mg/dL — ABNORMAL HIGH (ref 70–99)
Glucose-Capillary: 227 mg/dL — ABNORMAL HIGH (ref 70–99)

## 2023-10-29 LAB — MAGNESIUM: Magnesium: 1.7 mg/dL (ref 1.7–2.4)

## 2023-10-29 LAB — BODY FLUID CELL COUNT WITH DIFFERENTIAL
Eos, Fluid: 0 %
Lymphs, Fluid: 44 %
Monocyte-Macrophage-Serous Fluid: 22 % — ABNORMAL LOW (ref 50–90)
Neutrophil Count, Fluid: 34 % — ABNORMAL HIGH (ref 0–25)
Total Nucleated Cell Count, Fluid: 270 uL (ref 0–1000)

## 2023-10-29 LAB — ALBUMIN, PLEURAL OR PERITONEAL FLUID: Albumin, Fluid: <1.5 g/dL

## 2023-10-29 LAB — PROTEIN, PLEURAL OR PERITONEAL FLUID: Total protein, fluid: <3 g/dL

## 2023-10-29 LAB — AMMONIA: Ammonia: 60 umol/L — ABNORMAL HIGH (ref 9–35)

## 2023-10-29 LAB — PROTIME-INR
INR: 1.8 — ABNORMAL HIGH (ref 0.8–1.2)
Prothrombin Time: 21.5 s — ABNORMAL HIGH (ref 11.4–15.2)

## 2023-10-29 LAB — GLUCOSE, PLEURAL OR PERITONEAL FLUID: Glucose, Fluid: 202 mg/dL

## 2023-10-29 LAB — PHOSPHORUS: Phosphorus: 3 mg/dL (ref 2.5–4.6)

## 2023-10-29 MED ORDER — LIDOCAINE-EPINEPHRINE (PF) 2 %-1:200000 IJ SOLN
20.0000 mL | Freq: Once | INTRAMUSCULAR | Status: AC
  Administered 2023-10-29: 20 mL
  Filled 2023-10-29: qty 20

## 2023-10-29 MED ORDER — MORPHINE SULFATE (PF) 4 MG/ML IV SOLN
4.0000 mg | Freq: Once | INTRAVENOUS | Status: AC
  Administered 2023-10-29: 4 mg via INTRAVENOUS
  Filled 2023-10-29: qty 1

## 2023-10-29 MED ORDER — FIDAXOMICIN 200 MG PO TABS
200.0000 mg | ORAL_TABLET | Freq: Two times a day (BID) | ORAL | Status: DC
  Administered 2023-10-29 – 2023-10-30 (×3): 200 mg via ORAL
  Filled 2023-10-29 (×5): qty 1

## 2023-10-29 MED ORDER — ONDANSETRON HCL 4 MG PO TABS: 4.0000 mg | ORAL_TABLET | Freq: Four times a day (QID) | ORAL | Status: DC | PRN

## 2023-10-29 MED ORDER — PANTOPRAZOLE SODIUM 40 MG PO TBEC
40.0000 mg | DELAYED_RELEASE_TABLET | Freq: Every day | ORAL | Status: DC
  Administered 2023-10-29 – 2023-10-31 (×3): 40 mg via ORAL
  Filled 2023-10-29 (×3): qty 1

## 2023-10-29 MED ORDER — ALBUMIN HUMAN 25 % IV SOLN
50.0000 g | INTRAVENOUS | Status: AC
  Administered 2023-10-29: 50 g via INTRAVENOUS
  Filled 2023-10-29: qty 200

## 2023-10-29 MED ORDER — OXYCODONE HCL 5 MG PO TABS
10.0000 mg | ORAL_TABLET | Freq: Four times a day (QID) | ORAL | Status: DC | PRN
  Administered 2023-10-29: 10 mg via ORAL
  Filled 2023-10-29: qty 2

## 2023-10-29 MED ORDER — DIPHENHYDRAMINE HCL 25 MG PO CAPS
25.0000 mg | ORAL_CAPSULE | Freq: Four times a day (QID) | ORAL | Status: DC | PRN
  Administered 2023-10-29: 25 mg via ORAL
  Filled 2023-10-29: qty 1

## 2023-10-29 MED ORDER — LIDOCAINE HCL (PF) 1 % IJ SOLN
10.0000 mL | Freq: Once | INTRAMUSCULAR | Status: AC
  Administered 2023-10-29: 10 mL via INTRADERMAL

## 2023-10-29 MED ORDER — HYDROXYZINE HCL 25 MG PO TABS
50.0000 mg | ORAL_TABLET | Freq: Three times a day (TID) | ORAL | Status: DC | PRN
  Administered 2023-10-29: 50 mg via ORAL
  Filled 2023-10-29: qty 2

## 2023-10-29 MED ORDER — SODIUM CHLORIDE 0.9 % IV SOLN
2.0000 g | INTRAVENOUS | Status: DC
  Administered 2023-10-30 – 2023-10-31 (×2): 2 g via INTRAVENOUS
  Filled 2023-10-29 (×2): qty 20

## 2023-10-29 MED ORDER — FIDAXOMICIN 200 MG PO TABS: 200.0000 mg | ORAL_TABLET | Freq: Two times a day (BID) | ORAL | Status: DC

## 2023-10-29 MED ORDER — LACTULOSE 10 GM/15ML PO SOLN
20.0000 g | Freq: Three times a day (TID) | ORAL | Status: DC
  Administered 2023-10-29 – 2023-10-31 (×7): 20 g via ORAL
  Filled 2023-10-29 (×7): qty 30

## 2023-10-29 MED ORDER — INSULIN ASPART 100 UNIT/ML IJ SOLN
0.0000 [IU] | Freq: Every day | INTRAMUSCULAR | Status: DC
  Administered 2023-10-29: 2 [IU] via SUBCUTANEOUS
  Administered 2023-10-30: 4 [IU] via SUBCUTANEOUS

## 2023-10-29 MED ORDER — IOHEXOL 350 MG/ML SOLN
75.0000 mL | Freq: Once | INTRAVENOUS | Status: AC | PRN
  Administered 2023-10-29: 75 mL via INTRAVENOUS

## 2023-10-29 MED ORDER — ACETAMINOPHEN 650 MG RE SUPP: 650.0000 mg | Freq: Four times a day (QID) | RECTAL | Status: DC | PRN

## 2023-10-29 MED ORDER — ACETAMINOPHEN 325 MG PO TABS: 650.0000 mg | ORAL_TABLET | Freq: Four times a day (QID) | ORAL | Status: DC | PRN

## 2023-10-29 MED ORDER — INSULIN ASPART 100 UNIT/ML IJ SOLN
0.0000 [IU] | Freq: Three times a day (TID) | INTRAMUSCULAR | Status: DC
  Administered 2023-10-29 – 2023-10-30 (×3): 8 [IU] via SUBCUTANEOUS
  Administered 2023-10-30 (×2): 5 [IU] via SUBCUTANEOUS
  Administered 2023-10-31: 8 [IU] via SUBCUTANEOUS
  Administered 2023-10-31: 3 [IU] via SUBCUTANEOUS

## 2023-10-29 MED ORDER — FUROSEMIDE 40 MG PO TABS
80.0000 mg | ORAL_TABLET | Freq: Every day | ORAL | Status: DC
  Administered 2023-10-29 – 2023-10-31 (×2): 80 mg via ORAL
  Filled 2023-10-29: qty 2
  Filled 2023-10-29: qty 4

## 2023-10-29 MED ORDER — ALBUMIN HUMAN 25 % IV SOLN: 25.0000 g | INTRAVENOUS | Status: DC

## 2023-10-29 MED ORDER — SODIUM CHLORIDE 0.9 % IV SOLN
1.0000 g | Freq: Once | INTRAVENOUS | Status: AC
  Administered 2023-10-29: 1 g via INTRAVENOUS
  Filled 2023-10-29: qty 10

## 2023-10-29 MED ORDER — SPIRONOLACTONE 100 MG PO TABS
300.0000 mg | ORAL_TABLET | Freq: Every day | ORAL | Status: DC
  Administered 2023-10-29 – 2023-10-31 (×3): 300 mg via ORAL
  Filled 2023-10-29 (×3): qty 3

## 2023-10-29 MED ORDER — ONDANSETRON HCL 4 MG/2ML IJ SOLN
4.0000 mg | Freq: Once | INTRAMUSCULAR | Status: AC
  Administered 2023-10-29: 4 mg via INTRAVENOUS
  Filled 2023-10-29: qty 2

## 2023-10-29 MED ORDER — FLUOXETINE HCL 20 MG PO CAPS
40.0000 mg | ORAL_CAPSULE | Freq: Two times a day (BID) | ORAL | Status: DC
  Administered 2023-10-29 – 2023-10-31 (×5): 40 mg via ORAL
  Filled 2023-10-29 (×5): qty 2

## 2023-10-29 MED ORDER — ONDANSETRON HCL 4 MG/2ML IJ SOLN: 4.0000 mg | Freq: Four times a day (QID) | INTRAMUSCULAR | Status: DC | PRN

## 2023-10-29 NOTE — ED Provider Notes (Signed)
 [facility] Provider Note   CSN: [id number] Arrival date & time: 10/28/23  8091     Patient presents with: Diarrhea and Abdominal Pain   Laurie Sutton is a 43 y.o. female.   HPI     This is a 43 year old female who presents with abdominal pain and diarrhea.  Patient with [name].  Patient reports recent history of C. difficile.  She states she completed antibiotics and had improvement of symptoms.  Over the last 2 days she has had increasing worsening crampy abdominal pain.  She reports multiple loose stools.  It feels similar to when she had C. difficile.  She reports fevers at home and chills.  Her last hospital admission on 6/25 she was admitted for rule out SBP.  Was noted to have Clostridium difficile and Campylobacter.  Completed 10-day course of Dificid .  Prior to Admission medications   Medication Sig Start Date End Date Taking? Authorizing Provider  acetaminophen  (TYLENOL ) 325 MG tablet Take 2 tablets (650 mg total) by mouth every 6 (six) hours as needed for mild pain (pain score 1-3) or fever (or Fever >/= 101). 10/07/23   Sherrill Cable Latif, DO  albuterol  (VENTOLIN  HFA) 108 [phone] Base) MCG/ACT inhaler INHALE 2 PUFFS INTO THE LUNGS UP TO EVERY 6 HOURS AS NEEDED FOR WHEEZING/SHORTNESS OF BREATH 08/20/22   [name], MD  clotrimazole -betamethasone  (LOTRISONE ) cream APPLY 1 APPLICATION TOPICALLY TWICE A DAY AS NEEDED 05/26/23   [name], MD  Continuous Glucose Sensor (DEXCOM G7 SENSOR) MISC Use 1 sensor for continuous glucose monitoring every 10 days for 30 days 05/25/23   Braulio Hough, MD  FLUoxetine  (PROZAC ) 40 MG capsule TAKE 1 CAPSULE BY MOUTH TWICE A DAY 07/26/23   [name], MD  furosemide  (LASIX ) 80 MG tablet Take 1 tablet (80 mg total) by mouth daily. 10/11/23   Sherrill Cable Latif, DO  HUMULIN  R U-500 KWIKPEN 500 UNIT/ML KwikPen Use 120 units at breakfast, 100 at lunch, and 100 at dinner Patient taking  differently: Use 60 units at breakfast, 65 at lunch, and 65 at dinner 04/08/22   [name], MD  lactulose  (CHRONULAC ) 10 GM/15ML solution Take 30 mLs (20 g total) by mouth 2 (two) times daily. 10/07/23   Sherrill Cable Latif, DO  montelukast  (SINGULAIR ) 10 MG tablet Take 1 tablet (10 mg total) by mouth at bedtime. 08/25/22   [name], MD  omeprazole  (PRILOSEC) 40 MG capsule TAKE 1 CAPSULE (40 MG TOTAL) BY MOUTH IN THE MORNING 10/23/23   Legrand Victory LITTIE DOUGLAS, MD  ondansetron  (ZOFRAN ) 4 MG tablet Take 1 tablet (4 mg total) by mouth every 8 (eight) hours as needed for nausea or vomiting. 10/18/23   Craig Alan SAUNDERS, PA-C  ondansetron  (ZOFRAN -ODT) 4 MG disintegrating tablet 1 tablet on the tongue and allow to dissolve by mouth three times a day; Duration: 30 days    [provider]  potassium chloride  SA (KLOR-CON  M) 20 MEQ tablet Take 1 tablet (20 mEq total) by mouth daily. 10/11/23   Sherrill Cable Latif, DO  spironolactone  (ALDACTONE ) 100 MG tablet Take 2 tablets (200 mg total) by mouth daily. 10/11/23   Sherrill Cable Latif, DO  tirzepatide  (MOUNJARO ) 7.5 MG/0.5ML Pen Inject 7.5 mg Subcutaneous once a week 30 days 07/06/23       Allergies: Vancomycin , Amoxicillin, Azithromycin , Dulaglutide , Empagliflozin -metformin  hcl er, Januvia  [sitagliptin ], Latex, Metformin , and Penicillins    Review of Systems  Constitutional:  Positive for fever.  Respiratory:  Negative for shortness of breath.   Cardiovascular:  Negative for chest pain.  Gastrointestinal:  Positive for abdominal pain, diarrhea and nausea. Negative for blood in stool and vomiting.  All other systems reviewed and are negative.   Updated Vital Signs BP (!) 101/54   Pulse 83   Temp 98.3 F (36.8 C) (Oral)   Resp 14   Ht 1.676 m (5' 6)   Wt 89.4 kg   LMP 11/13/2020 (Exact Date)   SpO2 97%   BMI 31.81 kg/m   Physical Exam Vitals and nursing note reviewed.  Constitutional:      Appearance: She is well-developed. She is  obese. She is not ill-appearing.  HENT:     Head: Normocephalic and atraumatic.  Eyes:     Pupils: Pupils are equal, round, and reactive to light.  Cardiovascular:     Rate and Rhythm: Normal rate and regular rhythm.     Heart sounds: Normal heart sounds.  Pulmonary:     Effort: Pulmonary effort is normal. No respiratory distress.     Breath sounds: No wheezing.  Abdominal:     General: Bowel sounds are increased.     Palpations: Abdomen is soft.     Tenderness: There is generalized abdominal tenderness.  Musculoskeletal:     Cervical back: Neck supple.  Skin:    General: Skin is warm and dry.  Neurological:     Mental Status: She is alert and oriented to person, place, and time.  Psychiatric:        Mood and Affect: Mood normal.     (all labs ordered are listed, but only abnormal results are displayed) Labs Reviewed  COMPREHENSIVE METABOLIC PANEL WITH GFR - Abnormal; Notable for the following components:      Result Value   Sodium 131 (*)    CO2 20 (*)    Glucose, Bld 210 (*)    Calcium 8.4 (*)    Albumin  2.7 (*)    AST 53 (*)    Total Bilirubin 3.5 (*)    All other components within normal limits  CBC WITH DIFFERENTIAL/PLATELET - Abnormal; Notable for the following components:   RBC 3.51 (*)    Hemoglobin 11.1 (*)    HCT 33.3 (*)    Platelets 71 (*)    All other components within normal limits  LACTATE DEHYDROGENASE, PLEURAL OR PERITONEAL FLUID - Abnormal; Notable for the following components:   LD, Fluid 33 (*)    All other components within normal limits  BODY FLUID CELL COUNT WITH DIFFERENTIAL - Abnormal; Notable for the following components:   Appearance, Fluid HAZY (*)    Neutrophil Count, Fluid 34 (*)    Monocyte-Macrophage-Serous Fluid 22 (*)    All other components within normal limits  BODY FLUID CULTURE W GRAM STAIN  GASTROINTESTINAL PANEL BY PCR, STOOL (REPLACES STOOL CULTURE)  LIPASE, BLOOD  URINALYSIS, ROUTINE W REFLEX MICROSCOPIC  GLUCOSE,  PLEURAL OR PERITONEAL FLUID  PROTEIN, PLEURAL OR PERITONEAL FLUID  ALBUMIN , PLEURAL OR PERITONEAL FLUID     EKG: None  Radiology: CT ABDOMEN PELVIS W CONTRAST Result Date: 10/29/2023 CLINICAL DATA:  Abdominal pain. EXAM: CT ABDOMEN AND PELVIS WITH CONTRAST TECHNIQUE: Multidetector CT imaging of the abdomen and pelvis was performed using the standard protocol following bolus administration of intravenous contrast. RADIATION DOSE REDUCTION: This exam was performed according to the departmental dose-optimization program which includes automated exposure control, adjustment of the mA and/or kV according to patient  size and/or use of iterative reconstruction technique. CONTRAST:  75mL OMNIPAQUE  IOHEXOL  350 MG/ML SOLN COMPARISON:  October 04, 2023 FINDINGS: Lower chest: No acute abnormality. Hepatobiliary: The liver is cirrhotic in appearance without evidence of focal liver lesions. Tiny gallstones are seen without evidence of gallbladder wall thickening, pericholecystic inflammation or biliary dilatation. Pancreas: Unremarkable. No pancreatic ductal dilatation or surrounding inflammatory changes. Spleen: There is mild to moderate severity splenomegaly. Adrenals/Urinary Tract: Adrenal glands are unremarkable. Kidneys are normal, without renal calculi, focal lesion, or hydronephrosis. Bladder is unremarkable. Stomach/Bowel: Stomach is within normal limits. Appendix appears normal. No evidence of bowel dilatation. The wall of the ascending colon is markedly thickened. Vascular/Lymphatic: Stable splenic varices are seen. No additional significant vascular findings are present. No enlarged abdominal or pelvic lymph nodes. Reproductive: Status post hysterectomy. A 2.0 cm cyst is seen within the left adnexa. The right adnexa is unremarkable. Other: No abdominal wall hernia or abnormality. Moderate to marked severity abdominopelvic ascites is seen. Musculoskeletal: No acute or significant osseous findings. IMPRESSION: 1.  Cirrhosis with splenomegaly and splenic varices. 2. Moderate to marked severity abdominopelvic ascites. 3. Marked severity colitis involving the ascending colon. 4. Cholelithiasis. 5. 2.0 cm left adnexal cyst, likely ovarian in origin. No follow-up imaging is recommended. This recommendation follows ACR consensus guidelines: White Paper of the ACR Incidental Findings Committee II on Adnexal Findings. J Am Coll Radiol [phone]. Electronically Signed   By: Suzen Dials M.D.   On: 10/29/2023 01:23     .Critical Care  Performed by: Bari Charmaine FALCON, MD Authorized by: Bari Charmaine FALCON, MD   Critical care provider statement:    Critical care time (minutes):  40   Critical care was necessary to treat or prevent imminent or life-threatening deterioration of the following conditions:  Hepatic failure (SBP, colitis, multiple IV medications)   Critical care was time spent personally by me on the following activities:  Development of treatment plan with patient or surrogate, discussions with consultants, evaluation of patient's response to treatment, examination of patient, ordering and review of laboratory studies, ordering and review of radiographic studies, ordering and performing treatments and interventions, pulse oximetry, re-evaluation of patient's condition and review of old charts Paracentesis  Date/Time: 10/29/2023 5:30 AM  Performed by: Bari Charmaine FALCON, MD Authorized by: Bari Charmaine FALCON, MD   Consent:    Consent obtained:  Written   Consent given by:  Patient   Risks discussed:  Bleeding, bowel perforation, infection and pain   Alternatives discussed:  No treatment Universal protocol:    Patient identity confirmed:  Verbally with patient Pre-procedure details:    Preparation: Patient was prepped and draped in usual sterile fashion   Anesthesia:    Anesthesia method:  Local infiltration   Local anesthetic:  Lidocaine  2% WITH epi Procedure details:    Needle gauge:   18   Ultrasound guidance: yes     Puncture site:  L lower quadrant   Fluid removed amount:  20   Fluid appearance:  Yellow   Dressing:  Adhesive bandage Post-procedure details:    Procedure completion:  Tolerated    Medications Ordered in the ED  ondansetron  (ZOFRAN -ODT) disintegrating tablet 4 mg (4 mg Oral Not Given 10/29/23 0033)  fidaxomicin  (DIFICID ) tablet 200 mg (200 mg Oral Given 10/29/23 0227)  cefTRIAXone  (ROCEPHIN ) 1 g in sodium chloride  0.9 % 100 mL IVPB (has no administration in time range)  morphine  (PF) 4 MG/ML injection 4 mg (4 mg Intravenous Given 10/29/23 0143)  ondansetron  (  ZOFRAN ) injection 4 mg (4 mg Intravenous Given 10/29/23 0143)  iohexol  (OMNIPAQUE ) 350 MG/ML injection 75 mL (75 mLs Intravenous Contrast Given 10/29/23 0113)  lidocaine -EPINEPHrine  (XYLOCAINE  W/EPI) 2 %-1:200000 (PF) injection 20 mL (20 mLs Infiltration Given by Other 10/29/23 0227)    Clinical Course as of 10/29/23 0529  Sat Oct 29, 2023  0518 Peritoneal fluid was 270 white cells in the left shift today which is new from prior.  Given this and reported fevers at home, will cover for SBP.  Was also given medications for presumed ongoing C. Difficile. [CH]    Clinical Course User Index [CH] Tamara Kenyon, Charmaine FALCON, MD                                 Medical Decision Making Amount and/or Complexity of Data Reviewed Labs: ordered.  Risk Prescription drug management. Decision regarding hospitalization.   This patient presents to the ED for concern of abdominal pain, diarrhea, this involves an extensive number of treatment options, and is a complaint that carries with it a high risk of complications and morbidity.  I considered the following differential and admission for this acute, potentially life threatening condition.  The differential diagnosis includes recurrent colitis, SBP, SBO, gastritis, gastroenteritis  MDM:    This is a 43 year old female with recent history of C. difficile and cirrhosis  who presents with abdominal pain and diarrhea.  She is nontoxic.  Uncomfortable appearing.  Globally tender to palpation on exam.  She is nontoxic.  She is afebrile here but reports fevers at home.  Labs obtained.  No leukocytosis.  Urinalysis is reassuring.  Mild hyponatremia.  CT scan obtained.  CT shows marked severity of colitis in the ascending colon.  This compared to her last CT appears much worsened.  No indication to repeat C. difficile testing as she was positive less than 30 days ago.  Patient was redosed Dificid .  Bedside diagnostic paracentesis was also performed.  270 nucleated cells with a left shift noted.  This technically meets diagnostic criteria for SBP.  Patient covered with Rocephin .  Presentation likely multifactorial.  However, patient will require admission until cultures results.  (Labs, imaging, consults)  Labs: I Ordered, and personally interpreted labs.  The pertinent results include: CBC, BMP, urinalysis, paracentesis and body fluid panel  Imaging Studies ordered: I ordered imaging studies including CT I independently visualized and interpreted imaging. I agree with the radiologist interpretation  Additional history obtained from chart review.  External records from outside source obtained and reviewed including prior admission and discharge summary  Cardiac Monitoring: The patient was maintained on a cardiac monitor.  If on the cardiac monitor, I personally viewed and interpreted the cardiac monitored which showed an underlying rhythm of: Sinus  Reevaluation: After the interventions noted above, I reevaluated the patient and found that they have :stayed the same  Social Determinants of Health:  lives independently  Disposition: Admit  Co morbidities that complicate the patient evaluation  Past Medical History:  Diagnosis Date   ADHD    Allergy    Anemia    IRON TRANSFUSION 07-2020 NONE SINCE   Anxiety    Back pain    COVID 08/12/2019   ALL SYMPTOMS  REOLVED PER PT   Depression    Diabetic neuropathy (HCC) 11/11/2020   FEET   dm type 2    sees Dr. Odella Jacobson at [facility]   Fatty liver  GERD (gastroesophageal reflux disease)    History of kidney stones    Joint pain    Menorrhagia 11/11/2020   Migraines    Murmur, cardiac    FAINT NO CARDIOLOGIST   Other fatigue    Pneumonia 08/12/2019   COVID PNEUMONIA ALL SYMPTOMS RESOLVED PER PT   Shortness of breath on exertion      Medicines Meds ordered this encounter  Medications   ondansetron  (ZOFRAN -ODT) disintegrating tablet 4 mg   morphine  (PF) 4 MG/ML injection 4 mg   ondansetron  (ZOFRAN ) injection 4 mg   iohexol  (OMNIPAQUE ) 350 MG/ML injection 75 mL   lidocaine -EPINEPHrine  (XYLOCAINE  W/EPI) 2 %-1:200000 (PF) injection 20 mL   DISCONTD: fidaxomicin  (DIFICID ) tablet 200 mg   fidaxomicin  (DIFICID ) tablet 200 mg   cefTRIAXone  (ROCEPHIN ) 1 g in sodium chloride  0.9 % 100 mL IVPB    Antibiotic Indication::   Intra-abdominal    I have reviewed the patients home medicines and have made adjustments as needed  Problem List / ED Course: Problem List Items Addressed This Visit       Digestive   Colitis - Primary   Other Visit Diagnoses       SBP (spontaneous bacterial peritonitis) (HCC)       Relevant Medications   fidaxomicin  (DIFICID ) tablet 200 mg   cefTRIAXone  (ROCEPHIN ) 1 g in sodium chloride  0.9 % 100 mL IVPB                Final diagnoses:  Colitis  SBP (spontaneous bacterial peritonitis) [facility])    ED Discharge Orders     None          Bari Charmaine FALCON, MD 10/29/23 [phone]

## 2023-10-29 NOTE — H&P (Signed)
 History and Physical    [name] FMW:[id number] DOB: 16-Nov-1980 DOA: 10/28/2023  PCP: [name], MD  Patient coming from: Home  I have personally briefly reviewed patient's old medical records in [facility]  Chief Complaint: Abdominal pain, fever and diarrhea for 2 days  HPI: Laurie Sutton is a 43 y.o. female with medical history significant of migraine, GERD, depression with anxiety, type 2 diabetes, history of [name] liver cirrhosis, frequent UTI, hypertension, esophageal varices portal hypertensive gastropathy, C. difficile colitis presented with sharp abdominal pain, fever of 101.4 and diarrhea since 2 days.    Patient reports that she started with severe diarrhea all night last night and 6-7 episodes this morning, watery, not foul-smelling, brownish-yellow in color denies association with blood or mucus.  Complaining of severe abdominal pain with some nausea.  Had a fever of 101.4 associated with chills.  No recent sick contact.  No vomiting, poor appetite or unintentional weight loss.  She is compliant with home medications-diuretics and follow-up appointments.  Of note: Recently admitted from 10/10/2023 to 10/11/2023 with decompensated liver cirrhosis underwent paracentesis.  She admitted on 10/04/2023 with  sepsis in the setting of colitis 2/2 C. difficile and Campylobacter infection for which she completed 10-day course of dificid   Lives with husband at home.  No history of tobacco abuse, alcohol base, acid drug use.   ED Course: Upon arrival to ED patient remained afebrile.  Pulse 96, RR: 16, BP 112/62, CBC shows no leukocytosis, H&H 11.1/33.3, PLT 71, lipase: WNL, NA: 131, CO2 20, AST 53 total bilirubin 3.5, UA negative for infection.  Diagnostic paracentesis done in ER that shows elevated neutrophils.  CT abdomen shows marked severity of colitis in the ascending colon.  This compared to her last CT appears much worsened.  No indication to repeat C. difficile since  she was positive less than 30 days ago.  Patient was redosed with Dificit in ER.  Patient was given Rocephin  for SBP coverage.  [facility]  hospitalist consulted for admission.  Review of Systems: As per HPI otherwise negative.    Past Medical History:  Diagnosis Date   ADHD    Allergy    Anemia    IRON TRANSFUSION 07-2020 NONE SINCE   Anxiety    Back pain    COVID 08/12/2019   ALL SYMPTOMS REOLVED PER PT   Depression    Diabetic neuropathy (HCC) 11/11/2020   FEET   dm type 2    sees Dr. Odella Jacobson at [facility]   Fatty liver    GERD (gastroesophageal reflux disease)    History of kidney stones    Joint pain    Menorrhagia 11/11/2020   Migraines    Murmur, cardiac    FAINT NO CARDIOLOGIST   Other fatigue    Pneumonia 08/12/2019   COVID PNEUMONIA ALL SYMPTOMS RESOLVED PER PT   Shortness of breath on exertion     Past Surgical History:  Procedure Laterality Date   CESAREAN SECTION  12/13/2006   COLONOSCOPY WITH PROPOFOL  N/A 08/02/2023   Procedure: COLONOSCOPY WITH PROPOFOL ;  Surgeon: Legrand Victory LITTIE DOUGLAS, MD;  Location: [facility];  Service: Gastroenterology;  Laterality: N/A;   ESOPHAGOGASTRODUODENOSCOPY (EGD) WITH PROPOFOL  N/A 08/02/2023   Procedure: ESOPHAGOGASTRODUODENOSCOPY (EGD) WITH PROPOFOL ;  Surgeon: Legrand Victory LITTIE DOUGLAS, MD;  Location: [facility];  Service: Gastroenterology;  Laterality: N/A;   EXTRACORPOREAL SHOCK WAVE LITHOTRIPSY  2018   FOOT SURGERY Right 10/02/2020   RIGHT FOOT HEEL AND TOES, SURGICAL  CENTER OFF [address]   IR PARACENTESIS  07/06/2023   IR PARACENTESIS  08/18/2023   IR PARACENTESIS  10/05/2023   IR PARACENTESIS  10/11/2023   IR PARACENTESIS  10/20/2023   IR TRANSCATHETER BX  07/26/2023   IR US  GUIDE VASC ACCESS RIGHT  07/26/2023   IR VENOGRAM HEPATIC W HEMODYNAMIC EVALUATION  07/26/2023   LAPAROSCOPIC VAGINAL HYSTERECTOMY WITH SALPINGECTOMY Bilateral 11/17/2020   Procedure: LAPAROSCOPIC ASSISTED VAGINAL HYSTERECTOMY WITH BILATERAL  SALPINGECTOMY;  Surgeon: Dannielle Bouchard, DO;  Location: [facility];  Service: Gynecology;  Laterality: Bilateral;   POLYPECTOMY  08/02/2023   Procedure: POLYPECTOMY, INTESTINE;  Surgeon: Legrand Victory LITTIE DOUGLAS, MD;  Location: [facility];  Service: Gastroenterology;;     reports that she has never smoked. She has been exposed to tobacco smoke. She has never used smokeless tobacco. She reports that she does not drink alcohol and does not use drugs.  Allergies  Allergen Reactions   Vancomycin  Itching   Amoxicillin Hives and Itching   Azithromycin      DOES NOT WORK   Dulaglutide  Other (See Comments)    constipation and stomach issues  **Trulicity **    Empagliflozin -Metformin  Hcl Er Other (See Comments) and Swelling   Januvia  [Sitagliptin ] Other (See Comments)    HURTS STOAMCH   Latex     IRRITATES VAGINAL AREA   Metformin      Other Reaction(s): achy all over   Penicillins Hives    Has patient had a PCN reaction causing immediate rash, facial/tongue/throat swelling, SOB or lightheadedness with hypotension: yes. Rash  Has patient had a PCN reaction causing severe rash involving mucus membranes or skin necrosis: Yes- rash and hives all over body  Has patient had a PCN reaction that required hospitalization No Has patient had a PCN reaction occurring within the last 10 years: Yes  If all of the above answers are NO, then may proceed with Cephalosporin use.     Family History  Problem Relation Age of Onset   Colon polyps Mother    Depression Mother    Anxiety disorder Mother    Colon polyps Father    Sleep apnea Father    Anxiety disorder Father    Depression Father    Diabetes Father    Bladder Cancer Father 72   Rheum arthritis Father    Migraines Maternal Aunt    Migraines Maternal Grandmother    Diabetes Maternal Grandfather    Healthy Daughter    Asthma Daughter    Anxiety disorder Daughter    Anxiety disorder Son    Asthma Son    Healthy Son     Cerebral aneurysm Cousin    Heart disease Other    Depression Other    Anxiety disorder Other    Sleep apnea Other    Colon cancer Neg Hx    Esophageal cancer Neg Hx    Stomach cancer Neg Hx    Rectal cancer Neg Hx     Prior to Admission medications   Medication Sig Start Date End Date Taking? Authorizing Provider  acetaminophen  (TYLENOL ) 325 MG tablet Take 2 tablets (650 mg total) by mouth every 6 (six) hours as needed for mild pain (pain score 1-3) or fever (or Fever >/= 101). 10/07/23  Yes Sheikh, Omair Latif, DO  albuterol  (VENTOLIN  HFA) 108 [phone] Base) MCG/ACT inhaler INHALE 2 PUFFS INTO THE LUNGS UP TO EVERY 6 HOURS AS NEEDED FOR WHEEZING/SHORTNESS OF BREATH 08/20/22  Yes [name], MD  clotrimazole -betamethasone  (LOTRISONE )  cream APPLY 1 APPLICATION TOPICALLY TWICE A DAY AS NEEDED 05/26/23  Yes [name], MD  FLUoxetine  (PROZAC ) 40 MG capsule TAKE 1 CAPSULE BY MOUTH TWICE A DAY 07/26/23  Yes [name], MD  furosemide  (LASIX ) 80 MG tablet Take 1 tablet (80 mg total) by mouth daily. 10/11/23  Yes Sheikh, Omair Latif, DO  HUMULIN  R U-500 KWIKPEN 500 UNIT/ML KwikPen Use 120 units at breakfast, 100 at lunch, and 100 at dinner Patient taking differently: Use 60 units at breakfast, 65 at lunch, and 65 at dinner 04/08/22  Yes [name], MD  lactulose  (CHRONULAC ) 10 GM/15ML solution Take 30 mLs (20 g total) by mouth 2 (two) times daily. Patient taking differently: Take 20 g by mouth 3 (three) times daily. 10/07/23  Yes Sheikh, Omair Latif, DO  montelukast  (SINGULAIR ) 10 MG tablet Take 1 tablet (10 mg total) by mouth at bedtime. 08/25/22  Yes [name], MD  omeprazole  (PRILOSEC) 40 MG capsule TAKE 1 CAPSULE (40 MG TOTAL) BY MOUTH IN THE MORNING 10/23/23  Yes Danis, Victory LITTIE MOULD, MD  ondansetron  (ZOFRAN ) 4 MG tablet Take 1 tablet (4 mg total) by mouth every 8 (eight) hours as needed for nausea or vomiting. 10/18/23  Yes Craig Palma R, PA-C  spironolactone  (ALDACTONE ) 100 MG tablet  Take 2 tablets (200 mg total) by mouth daily. Patient taking differently: Take 300 mg by mouth daily. 10/11/23  Yes Sheikh, Omair Latif, DO  Continuous Glucose Sensor (DEXCOM G7 SENSOR) MISC Use 1 sensor for continuous glucose monitoring every 10 days for 30 days 05/25/23   Braulio Hough, MD  ondansetron  (ZOFRAN -ODT) 4 MG disintegrating tablet 1 tablet on the tongue and allow to dissolve by mouth three times a day; Duration: 30 days    [provider]  potassium chloride  SA (KLOR-CON  M) 20 MEQ tablet Take 1 tablet (20 mEq total) by mouth daily. Patient not taking: Reported on 10/29/2023 10/11/23   Sherrill Cable Latif, DO  tirzepatide  (MOUNJARO ) 7.5 MG/0.5ML Pen Inject 7.5 mg Subcutaneous once a week 30 days Patient not taking: Reported on 10/29/2023 07/06/23       Physical Exam: Vitals:   10/29/23 0700 10/29/23 0715 10/29/23 0730 10/29/23 0800  BP: (!) 98/58 (!) 99/57 97/63 102/61  Pulse: 82 81 77 79  Resp: 15 14 15 13   Temp:      TempSrc:      SpO2: 96% 96% 96% 96%  Weight:      Height:        Constitutional: NAD, calm, comfortable, on room air, communicating well Eyes: PERRL, lids and conjunctivae normal ENMT: Mucous membranes are moist. Posterior pharynx clear of any exudate or lesions.Normal dentition.  Neck: normal, supple, no masses, no thyromegaly Respiratory: clear to auscultation bilaterally, no wheezing, no crackles. Normal respiratory effort. No accessory muscle use.  Cardiovascular: Regular rate and rhythm, no murmurs / rubs / gallops. No extremity edema. 2+ pedal pulses. No carotid bruits.  Abdomen:  soft, distended, mild lower abdominal tenderness positive, no guarding, no rigidity, bowel sounds positive Musculoskeletal: no clubbing / cyanosis. No joint deformity upper and lower extremities. Good ROM, no contractures. Normal muscle tone.  Skin: no rashes, lesions, ulcers. No induration Neurologic: CN 2-12 grossly intact. Sensation intact, DTR normal. Strength 5/5 in all  4.  Psychiatric: Normal judgment and insight. Alert and oriented x 3. Normal mood.    Labs on Admission: I have personally reviewed following labs and imaging studies  CBC: Recent Labs  Lab  10/28/23 1933  WBC 5.2  NEUTROABS 3.8  HGB 11.1*  HCT 33.3*  MCV 94.9  PLT 71*   Basic Metabolic Panel: Recent Labs  Lab 10/28/23 1933  NA 131*  K 3.6  CL 102  CO2 20*  GLUCOSE 210*  BUN 8  CREATININE 0.83  CALCIUM 8.4*   GFR: Estimated Creatinine Clearance: 99.4 mL/min (by C-G formula based on SCr of 0.83 mg/dL). Liver Function Tests: Recent Labs  Lab 10/28/23 1933  AST 53*  ALT 31  ALKPHOS 91  BILITOT 3.5*  PROT 6.7  ALBUMIN  2.7*   Recent Labs  Lab 10/28/23 1933  LIPASE 51   No results for input(s): AMMONIA in the last 168 hours. Coagulation Profile: No results for input(s): INR, PROTIME in the last 168 hours. Cardiac Enzymes: No results for input(s): CKTOTAL, CKMB, CKMBINDEX, TROPONINI in the last 168 hours. BNP (last 3 results) No results for input(s): PROBNP in the last 8760 hours. HbA1C: No results for input(s): HGBA1C in the last 72 hours. CBG: No results for input(s): GLUCAP in the last 168 hours. Lipid Profile: No results for input(s): CHOL, HDL, LDLCALC, TRIG, CHOLHDL, LDLDIRECT in the last 72 hours. Thyroid  Function Tests: No results for input(s): TSH, T4TOTAL, FREET4, T3FREE, THYROIDAB in the last 72 hours. Anemia Panel: No results for input(s): VITAMINB12, FOLATE, FERRITIN, TIBC, IRON, RETICCTPCT in the last 72 hours. Urine analysis:    Component Value Date/Time   COLORURINE YELLOW 10/28/2023 1928   APPEARANCEUR CLEAR 10/28/2023 1928   LABSPEC 1.009 10/28/2023 1928   PHURINE 7.0 10/28/2023 1928   GLUCOSEU NEGATIVE 10/28/2023 1928   HGBUR NEGATIVE 10/28/2023 1928   BILIRUBINUR NEGATIVE 10/28/2023 1928   BILIRUBINUR 1+ 10/07/2022 0917   KETONESUR NEGATIVE 10/28/2023 1928   PROTEINUR  NEGATIVE 10/28/2023 1928   UROBILINOGEN 1.0 10/07/2022 0917   UROBILINOGEN 1.0 12/21/2014 1020   NITRITE NEGATIVE 10/28/2023 1928   LEUKOCYTESUR NEGATIVE 10/28/2023 1928    Radiological Exams on Admission:   Assessment/Plan  SBP: - Presented with abdominal pain, fever and chills with leukocytosis - Diagnostic paracentesis done in the ER that showed elevated neutrophil count - Given Rocephin  in ER.  Will continue same.  Awaiting culture - Consult GI  [name] hepatic cirrhosis-decompensated History of portal hypertensive gastropathy Esophageal varices: Hyperbilirubinemia Ascites - Followed by GI outpatient.  Given albumin  in ED. - Continue home dose of lactulose , Lasix  and spironolactone .  Continue PPI - Consult IR for paracentesis  Severe Colitis - History of C. difficile and Campylobacter: Presented with abdominal pain and diarrhea - She finished 10-day course of Dificid .   started on Dificid  in the ER - CT abdomen showed mild severity colitis involving the ascending colon.  Moderate to marked severity abdominopelvic ascites and cholelithiasis.  Depression/anxiety: Continue Prozac   Insulin -dependent type 2 diabetes: Well-controlled.  Start sliding scale insulin   Chronic anemia Chronic thrombocytopenia: In the setting of liver cirrhosis.  Continue to monitor.  No signs of active bleeding  Left ovarian cyst: - Noted on CT abdomen.  No follow-up needed per radiology  DVT prophylaxis: SCD, no chemical prophylaxis due to thrombocytopenia Code Status: Full code Family Communication: None present at bedside.  Plan of care discussed with patient in length and he verbalized understanding and agreed with it. Disposition Plan: To be determined Consults called: IR and GI Admission status: Inpatient   Velna JONELLE Skeeter MD [facility]  If 7PM-7AM, please contact night-coverage [email or web address]  10/29/2023, 9:18 AM

## 2023-10-29 NOTE — Hospital Course (Addendum)
   42-yo f phx of decompensated hepatic cirrhosis, hyperammonemia, portal hypertensive gastropathy, esophageal varices, C. difficile and Campylobacter jejuni associated GI infection, left-sided ovarian cyst, chronic dyspnea secondary to volume overload, depression, anxiety, reactive airway disease, insulin -dependent DM type II, chronic pancytopenia, gout, hypoalbuminemia, hyperammonemia, and morbid obesity presents emergency department plaint abdominal pain and diarrhea.Patient reports recent history of C. difficile.  She states she completed antibiotics and had improvement of symptoms.  Over the last 2 days she has had increasing worsening crampy abdominal pain.  She reports multiple loose stools.  It feels similar to when she had C. difficile.  She reports fevers at home and chills.   Her last hospital admission on 6/25 she was admitted for rule out SBP.  Was noted to have Clostridium difficile and Campylobacter.  Completed 10-day course of Dificid .   Presentation to ED patient found borderline hypotensive.  Pending GI panel.  CBC unremarkable stable H&H and platelet count is baseline.  CMP showing low sodium 131, low bicarb 20, elevated AST and bilirubin 3.5 which is around baseline.  Normal lipase level.   Abdomen pelvis showing cirrhosis with splenomegaly, splenic varices.  Moderate to marked severity abdomen pelvic ascites.  Cholelithiasis.  Severe colitis of the ascending colon.  Adrenal gland cyst.   In the ED patient has been given ceftriaxone  1 g and Fidaxomicin .  Dr. Bari concerned about underlying SBP as well.  In the ED and has diagnostic paracentesis which showed elevated WBC count 270.   [facility] has been consulted by Dr. Bari.   Hospitalist has been consulted for further impression management of concern for recurrent C. difficile colitis, SBP, ascites and decompensated hepatic cirrhosis.

## 2023-10-29 NOTE — ED Notes (Signed)
 Called 73M secretary to notify the patient would be coming upstairs now.

## 2023-10-29 NOTE — Procedures (Signed)
 PROCEDURE SUMMARY:  Successful ultrasound guided therapeutic paracentesis from the right lower quadrant.  Yielded 2.7 liters of amber fluid.  No immediate complications.  The patient tolerated the procedure well.   .  EBL none  The patient has required >/=2 paracenteses in a 30 day period and a screening evaluation by the [facility] has been arranged.   Kevin Noboru Bidinger,PA-C

## 2023-10-29 NOTE — Consult Note (Signed)
 Consultation  Referring Provider: [facility]/ Pahwani Primary Care Physician:  [name], MD Primary Gastroenterologist:  Dr.Danis  Reason for Consultation: Abdominal pain, cramping and diarrhea in patient with decompensated [name]  HPI: Laurie Sutton is a 43 y.o. female with history of [name], decompensated with portal hypertension ascites and hepatic encephalopathy.  She also has adult onset diabetes mellitus, ADHD, peripheral neuropathy. She was hospitalized 6/24 through 10/07/2023 with abdominal pain, fever, and diarrhea and was diagnosed with both C. difficile and Campylobacter.  She was given 1 dose of vancomycin  and had reaction with pruritus so was switched to Dificid .  She completed a 10-day course of Dificid . She was hospitalized in late June with abdominal pain and shortness of breath, and at that time found to have large volume ascites and had undergone large-volume paracentesis with no evidence of SBP.  Her diuretics were increased. She was seen back in our office last week for follow-up and at that time the diarrhea had resolved.  She was noted to have some increased confusion intermittent per her daughter who accompanied her and had asterixis on exam so had her lactulose  increased to 30 cc 3 times daily.  She has not been on Xifaxan.  Unfortunately on Wednesday 4 days ago she developed recurrent abdominal pain, cramping and diarrhea.  She says she was having at least 5-6 bowel movements per day, poor appetite, no vomiting and did have a fever at home to 1017 yesterday.  She presented back to the emergency room, CT of the abdomen and pelvis done early in the morning hours today shows stable splenic varices, status post hysterectomy, there is wall thickening of the ascending colon marked cirrhotic appearing liver, moderate to marked ascites and cholelithiasis, she also has a 2 cm left adnexal cyst  She underwent diagnostic paracentesis this morning, cell counts  reviewed and not consistent with SBP, she did receive 1 dose of ceftriaxone  She then went back for large-volume paracentesis and had an additional 2.7 L removed.  Labs yesterday show WBC 5.2/hemoglobin 11.1/hematocrit 33.3 Sodium 131/potassium 3.6/BUN 8/creatinine 0.83 Albumin  2.7 T. bili 3.5/AST 53 C Diff-Quik screen and GI path panel are pending  MELD 3.0: 21 at 10/20/2023 11:14 AM MELD-Na: 21 at 10/20/2023 11:14 AM Calculated from: Serum Creatinine: 0.68 mg/dL (Using min of 1 mg/dL) at 2/89/7974 88:85 AM Serum Sodium: 134 mEq/L at 10/20/2023 11:14 AM Total Bilirubin: 3.9 mg/dL at 2/89/7974 88:85 AM Serum Albumin : 3.5 g/dL at 2/89/7974 88:85 AM INR(ratio): 1.9 ratio at 10/20/2023 11:14 AM Age at listing (hypothetical): 42 years Sex: Female at 10/20/2023 11:14 AM   She says that she backed off on her lactulose  dosing over the past 2 days because of diarrhea and has felt sort of out of it since then having some difficulty finding words.  Patient had recent EGD and colonoscopy April 2025 grade 2 esophageal varices, portal gastropathy throughout the entire stomach, and 2 changes in the prepyloric area that may be gastric varices despite the atypical location.  At colonoscopy at same setting evidence of portal colopathy , and one 4 mm polyp was removed from the transverse colon biopsy showed tubular adenoma.  She has not been started on a beta-blocker, recent increased dose of her diuretics with Lasix  80 mg daily and Aldactone  300 mg daily  She has also been on Mounjaro .   Past Medical History:  Diagnosis Date   ADHD    Allergy    Anemia    IRON TRANSFUSION 07-2020 NONE  SINCE   Anxiety    Back pain    COVID 08/12/2019   ALL SYMPTOMS REOLVED PER PT   Depression    Diabetic neuropathy (HCC) 11/11/2020   FEET   dm type 2    sees Dr. Odella Jacobson at [facility]   Fatty liver    GERD (gastroesophageal reflux disease)    History of kidney stones    Joint pain    Menorrhagia  11/11/2020   Migraines    Murmur, cardiac    FAINT NO CARDIOLOGIST   Other fatigue    Pneumonia 08/12/2019   COVID PNEUMONIA ALL SYMPTOMS RESOLVED PER PT   Shortness of breath on exertion     Past Surgical History:  Procedure Laterality Date   CESAREAN SECTION  12/13/2006   COLONOSCOPY WITH PROPOFOL  N/A 08/02/2023   Procedure: COLONOSCOPY WITH PROPOFOL ;  Surgeon: Legrand Victory LITTIE DOUGLAS, MD;  Location: [facility];  Service: Gastroenterology;  Laterality: N/A;   ESOPHAGOGASTRODUODENOSCOPY (EGD) WITH PROPOFOL  N/A 08/02/2023   Procedure: ESOPHAGOGASTRODUODENOSCOPY (EGD) WITH PROPOFOL ;  Surgeon: Legrand Victory LITTIE DOUGLAS, MD;  Location: [facility];  Service: Gastroenterology;  Laterality: N/A;   EXTRACORPOREAL SHOCK WAVE LITHOTRIPSY  2018   FOOT SURGERY Right 10/02/2020   RIGHT FOOT HEEL AND TOES, [facility] OFF [address]   IR PARACENTESIS  07/06/2023   IR PARACENTESIS  08/18/2023   IR PARACENTESIS  10/05/2023   IR PARACENTESIS  10/11/2023   IR PARACENTESIS  10/20/2023   IR TRANSCATHETER BX  07/26/2023   IR US  GUIDE VASC ACCESS RIGHT  07/26/2023   IR VENOGRAM HEPATIC W HEMODYNAMIC EVALUATION  07/26/2023   LAPAROSCOPIC VAGINAL HYSTERECTOMY WITH SALPINGECTOMY Bilateral 11/17/2020   Procedure: LAPAROSCOPIC ASSISTED VAGINAL HYSTERECTOMY WITH BILATERAL SALPINGECTOMY;  Surgeon: Dannielle Bouchard, DO;  Location: [facility];  Service: Gynecology;  Laterality: Bilateral;   POLYPECTOMY  08/02/2023   Procedure: POLYPECTOMY, INTESTINE;  Surgeon: Legrand Victory LITTIE DOUGLAS, MD;  Location: [facility];  Service: Gastroenterology;;    Prior to Admission medications   Medication Sig Start Date End Date Taking? Authorizing Provider  acetaminophen  (TYLENOL ) 325 MG tablet Take 2 tablets (650 mg total) by mouth every 6 (six) hours as needed for mild pain (pain score 1-3) or fever (or Fever >/= 101). 10/07/23  Yes Sheikh, Omair Latif, DO  albuterol  (VENTOLIN  HFA) 108 [phone] Base) MCG/ACT inhaler INHALE 2 PUFFS INTO THE  LUNGS UP TO EVERY 6 HOURS AS NEEDED FOR WHEEZING/SHORTNESS OF BREATH 08/20/22  Yes [name], MD  clotrimazole -betamethasone  (LOTRISONE ) cream APPLY 1 APPLICATION TOPICALLY TWICE A DAY AS NEEDED 05/26/23  Yes [name], MD  FLUoxetine  (PROZAC ) 40 MG capsule TAKE 1 CAPSULE BY MOUTH TWICE A DAY 07/26/23  Yes [name], MD  furosemide  (LASIX ) 80 MG tablet Take 1 tablet (80 mg total) by mouth daily. 10/11/23  Yes Sheikh, Omair Latif, DO  HUMULIN  R U-500 KWIKPEN 500 UNIT/ML KwikPen Use 120 units at breakfast, 100 at lunch, and 100 at dinner Patient taking differently: Use 60 units at breakfast, 65 at lunch, and 65 at dinner 04/08/22  Yes [name], MD  lactulose  (CHRONULAC ) 10 GM/15ML solution Take 30 mLs (20 g total) by mouth 2 (two) times daily. Patient taking differently: Take 20 g by mouth 3 (three) times daily. 10/07/23  Yes Sheikh, Omair Latif, DO  montelukast  (SINGULAIR ) 10 MG tablet Take 1 tablet (10 mg total) by mouth at bedtime. 08/25/22  Yes [name], MD  omeprazole  (PRILOSEC) 40 MG  capsule TAKE 1 CAPSULE (40 MG TOTAL) BY MOUTH IN THE MORNING 10/23/23  Yes Danis, Victory CROME III, MD  ondansetron  (ZOFRAN ) 4 MG tablet Take 1 tablet (4 mg total) by mouth every 8 (eight) hours as needed for nausea or vomiting. 10/18/23  Yes Craig Palma R, PA-C  spironolactone  (ALDACTONE ) 100 MG tablet Take 2 tablets (200 mg total) by mouth daily. Patient taking differently: Take 300 mg by mouth daily. 10/11/23  Yes Sheikh, Omair Latif, DO  Continuous Glucose Sensor (DEXCOM G7 SENSOR) MISC Use 1 sensor for continuous glucose monitoring every 10 days for 30 days 05/25/23   Braulio Hough, MD  ondansetron  (ZOFRAN -ODT) 4 MG disintegrating tablet 1 tablet on the tongue and allow to dissolve by mouth three times a day; Duration: 30 days    [provider]  potassium chloride  SA (KLOR-CON  M) 20 MEQ tablet Take 1 tablet (20 mEq total) by mouth daily. Patient not taking: Reported on 10/29/2023 10/11/23    Sheikh, Omair Latif, DO  tirzepatide  (MOUNJARO ) 7.5 MG/0.5ML Pen Inject 7.5 mg Subcutaneous once a week 30 days Patient not taking: Reported on 10/29/2023 07/06/23       Current Facility-Administered Medications  Medication Dose Route Frequency Provider Last Rate Last Admin   acetaminophen  (TYLENOL ) tablet 650 mg  650 mg Oral Q6H PRN Pahwani, Rinka R, MD       Or   acetaminophen  (TYLENOL ) suppository 650 mg  650 mg Rectal Q6H PRN Pahwani, Rinka R, MD       [START ON 10/30/2023] cefTRIAXone  (ROCEPHIN ) 2 g in sodium chloride  0.9 % 100 mL IVPB  2 g Intravenous Q24H Pahwani, Rinka R, MD       fidaxomicin  (DIFICID ) tablet 200 mg  200 mg Oral BID Bari, Charmaine FALCON, MD   200 mg at 10/29/23 0227   FLUoxetine  (PROZAC ) capsule 40 mg  40 mg Oral BID Pahwani, Rinka R, MD   40 mg at 10/29/23 1047   furosemide  (LASIX ) tablet 80 mg  80 mg Oral Daily Pahwani, Rinka R, MD   80 mg at 10/29/23 1047   insulin  aspart (novoLOG ) injection 0-15 Units  0-15 Units Subcutaneous TID WC Pahwani, Rinka R, MD   8 Units at 10/29/23 1233   insulin  aspart (novoLOG ) injection 0-5 Units  0-5 Units Subcutaneous QHS Pahwani, Rinka R, MD       lactulose  (CHRONULAC ) 10 GM/15ML solution 20 g  20 g Oral TID Pahwani, Rinka R, MD   20 g at 10/29/23 1411   ondansetron  (ZOFRAN ) tablet 4 mg  4 mg Oral Q6H PRN Pahwani, Rinka R, MD       Or   ondansetron  (ZOFRAN ) injection 4 mg  4 mg Intravenous Q6H PRN Pahwani, Rinka R, MD       ondansetron  (ZOFRAN -ODT) disintegrating tablet 4 mg  4 mg Oral Once Meredith, Savannah F, PA-C       pantoprazole  (PROTONIX ) EC tablet 40 mg  40 mg Oral Daily Pahwani, Rinka R, MD   40 mg at 10/29/23 1046   spironolactone  (ALDACTONE ) tablet 300 mg  300 mg Oral Daily Pahwani, Rinka R, MD   300 mg at 10/29/23 1046    Allergies as of 10/28/2023 - Review Complete 10/28/2023  Allergen Reaction Noted   Vancomycin  Itching 10/04/2023   Amoxicillin Hives and Itching 02/16/2011   Azithromycin   07/25/2017   Dulaglutide   Other (See Comments) 08/21/2020   Empagliflozin -metformin  hcl er Other (See Comments) and Swelling 08/21/2020   Januvia  [sitagliptin ] Other (See Comments) 11/18/2014  Latex  07/25/2017   Metformin   12/16/2022   Penicillins Hives 02/16/2011    Family History  Problem Relation Age of Onset   Colon polyps Mother    Depression Mother    Anxiety disorder Mother    Colon polyps Father    Sleep apnea Father    Anxiety disorder Father    Depression Father    Diabetes Father    Bladder Cancer Father 70   Rheum arthritis Father    Migraines Maternal Aunt    Migraines Maternal Grandmother    Diabetes Maternal Grandfather    Healthy Daughter    Asthma Daughter    Anxiety disorder Daughter    Anxiety disorder Son    Asthma Son    Healthy Son    Cerebral aneurysm Cousin    Heart disease Other    Depression Other    Anxiety disorder Other    Sleep apnea Other    Colon cancer Neg Hx    Esophageal cancer Neg Hx    Stomach cancer Neg Hx    Rectal cancer Neg Hx     Social History   Socioeconomic History   Marital status: Married    Spouse name: Adam   Number of children: 2   Years of education: Not on file   Highest education level: 12th grade  Occupational History   Occupation: Arts administrator and Nutrition    Employer: [employer]    Comment: works 20 hours per week  Tobacco Use   Smoking status: Never    Passive exposure: Current   Smokeless tobacco: Never  Vaping Use   Vaping status: Never Used  Substance and Sexual Activity   Alcohol use: No    Alcohol/week: 0.0 standard drinks of alcohol   Drug use: No   Sexual activity: Yes    Partners: Male    Birth control/protection: None  Other Topics Concern   Not on file  Social History Narrative   Not on file   Social Drivers of Health   Financial Resource Strain: Low Risk  (09/19/2022)   Overall Financial Resource Strain (CARDIA)    Difficulty of Paying Living Expenses: Not hard at all  Food Insecurity: No Food  Insecurity (10/10/2023)   Hunger Vital Sign    Worried About Running Out of Food in the Last Year: Never true    Ran Out of Food in the Last Year: Never true  Transportation Needs: No Transportation Needs (10/10/2023)   PRAPARE - Administrator, Civil Service (Medical): No    Lack of Transportation (Non-Medical): No  Physical Activity: Insufficiently Active (06/04/2021)   Exercise Vital Sign    Days of Exercise per Week: 2 days    Minutes of Exercise per Session: 10 min  Stress: Stress Concern Present (09/19/2022)   [employer] of Occupational Health - Occupational Stress Questionnaire    Feeling of Stress : To some extent  Social Connections: Socially Integrated (10/05/2023)   Social Connection and Isolation Panel    Frequency of Communication with Friends and Family: More than three times a week    Frequency of Social Gatherings with Friends and Family: Twice a week    Attends Religious Services: More than 4 times per year    Active Member of [employer] or Organizations: No    Attends Engineer, structural: More than 4 times per year    Marital Status: Married  Catering manager Violence: Not At Risk (10/10/2023)   Humiliation, Afraid, Rape, and  Kick questionnaire    Fear of Current or Ex-Partner: No    Emotionally Abused: No    Physically Abused: No    Sexually Abused: No    Review of Systems: Pertinent positive and negative review of systems were noted in the above HPI section.  All other review of systems was otherwise negative.   Physical Exam: Vital signs in last 24 hours: Temp:  [97.4 F (36.3 C)-98.6 F (37 C)] 98.1 F (36.7 C) (07/19 1343) Pulse Rate:  [77-96] 88 (07/19 1340) Resp:  [13-20] 20 (07/19 1340) BP: (78-124)/(48-65) 105/63 (07/19 1340) SpO2:  [95 %-100 %] 98 % (07/19 1340) Weight:  [89.4 kg] 89.4 kg (07/18 1926)   General:   Alert,  Well-developed, well-nourished, ill-appearing white female pleasant and cooperative in NAD Head:   Normocephalic and atraumatic. Eyes:  Sclera clear, no icterus.   Conjunctiva pink. Ears:  Normal auditory acuity. Nose:  No deformity, discharge,  or lesions. Mouth:  No deformity or lesions.   Neck:  Supple; no masses or thyromegaly. Lungs:  Clear throughout to auscultation.   No wheezes, crackles, or rhonchi . Heart:  Regular rate and rhythm; no murmurs, clicks, rubs,  or gallops. Abdomen:  Soft, nontense ascites BS active,nonpalp mass or hsm.  Rather generalized abdominal tenderness no guarding or rebound Rectal: Not done Msk:  Symmetrical without gross deformities. . Pulses:  Normal pulses noted. Extremities: 1+ edema Neurologic:  Alert and  oriented x4;  grossly normal neurologically.  No asterixis, some difficulty with word finding Skin:  Intact without significant lesions or rashes.. Psych:  Alert and cooperative. Normal mood and affect.  Intake/Output from previous day: No intake/output data recorded. Intake/Output this shift: No intake/output data recorded.  Lab Results: Recent Labs    10/28/23 1933  WBC 5.2  HGB 11.1*  HCT 33.3*  PLT 71*   BMET Recent Labs    10/28/23 1933  NA 131*  K 3.6  CL 102  CO2 20*  GLUCOSE 210*  BUN 8  CREATININE 0.83  CALCIUM 8.4*   LFT Recent Labs    10/28/23 1933  PROT 6.7  ALBUMIN  2.7*  AST 53*  ALT 31  ALKPHOS 91  BILITOT 3.5*   PT/INR No results for input(s): LABPROT, INR in the last 72 hours. Hepatitis Panel No results for input(s): HEPBSAG, HCVAB, HEPAIGM, HEPBIGM in the last 72 hours.   IMPRESSION:  #17 43 year old female with decompensated [name] with acute decompensation associated with recent C. difficile and Campylobacter infection culminating in increased ascites and some increase in hepatic encephalopathy.  Adverse reaction to vancomycin . She completed a course of Dificid  around 10/16/2023. She had an increase in dose of her lactulose  last week at office visit due to family noting some  increased confusion intermittently. Then abrupt onset of diarrhea on Wednesday 10/26/23 as well as abdominal pain and cramping.  She was having at least 5-6 bowel movements every day, decrease in oral intake and then on Friday developed temp to 1017 at home.  Workup in the ER with CT of the abdomen and pelvis does show cirrhotic liver, with moderate to large amount of ascites, and moderately severe splenomegaly.  There is colitis involving the ascending colon.  She has had large-volume paracentesis, with cell counts not consistent with SBP  GI path panel and C Diff-Quik screen are pending  Suspect she has had relapse of C. difficile colitis, also need to rule out any other new infectious etiology as she related that her father  had been sick at home with diarrhea over the past couple of weeks, living in the same home but he had not discussed this with anyone  #2 hepatic encephalopathy, grade 1, exacerbated by acute illness #3 grade 2 esophageal varices and probable gastric varices #4 previously documented portal colopathy colonoscopy April 2025   PLAN: Diet as tolerates Agree with restarting Dificid  Await GI path panel and C. difficile quick screen Continue lactulose  at 30 cc 3 times daily She may fit from addition of Xifaxan but would wait until we get stool studies back and if she has other infection completed course of treatment for that. Okay to continue current diuretics but monitor renal function closely in setting of diarrhea. Will need to consider adding carvedilol on discharge. GI will follow with you  Patient has been established with [facility], and will continue follow-up there   Mailen Newborn EsterwoodPA-C  10/29/2023, 2:45 PM

## 2023-10-30 DIAGNOSIS — K529 Noninfective gastroenteritis and colitis, unspecified: Secondary | ICD-10-CM

## 2023-10-30 LAB — COMPREHENSIVE METABOLIC PANEL WITH GFR
ALT: 24 U/L (ref 0–44)
AST: 36 U/L (ref 15–41)
Albumin: 3.1 g/dL — ABNORMAL LOW (ref 3.5–5.0)
Alkaline Phosphatase: 84 U/L (ref 38–126)
Anion gap: 13 (ref 5–15)
BUN: <5 mg/dL — ABNORMAL LOW (ref 6–20)
CO2: 22 mmol/L (ref 22–32)
Calcium: 8.5 mg/dL — ABNORMAL LOW (ref 8.9–10.3)
Chloride: 100 mmol/L (ref 98–111)
Creatinine, Ser: 0.71 mg/dL (ref 0.44–1.00)
GFR, Estimated: >60 mL/min (ref >60)
Glucose, Bld: 191 mg/dL — ABNORMAL HIGH (ref 70–99)
Potassium: 3.2 mmol/L — ABNORMAL LOW (ref 3.5–5.1)
Sodium: 135 mmol/L (ref 135–145)
Total Bilirubin: 2.8 mg/dL — ABNORMAL HIGH (ref 0.0–1.2)
Total Protein: 6.8 g/dL (ref 6.5–8.1)

## 2023-10-30 LAB — GLUCOSE, CAPILLARY
Glucose-Capillary: 233 mg/dL — ABNORMAL HIGH (ref 70–99)
Glucose-Capillary: 293 mg/dL — ABNORMAL HIGH (ref 70–99)
Glucose-Capillary: 225 mg/dL — ABNORMAL HIGH (ref 70–99)
Glucose-Capillary: 313 mg/dL — ABNORMAL HIGH (ref 70–99)

## 2023-10-30 LAB — C DIFFICILE QUICK SCREEN W PCR REFLEX
C Diff antigen: NEGATIVE
C Diff interpretation: NOT DETECTED
C Diff toxin: NEGATIVE

## 2023-10-30 LAB — CBC
HCT: 32.7 % — ABNORMAL LOW (ref 36.0–46.0)
Hemoglobin: 10.7 g/dL — ABNORMAL LOW (ref 12.0–15.0)
MCH: 31.6 pg (ref 26.0–34.0)
MCHC: 32.7 g/dL (ref 30.0–36.0)
MCV: 96.5 fL (ref 80.0–100.0)
Platelets: 57 K/uL — ABNORMAL LOW (ref 150–400)
RBC: 3.39 MIL/uL — ABNORMAL LOW (ref 3.87–5.11)
RDW: 14.6 % (ref 11.5–15.5)
WBC: 2 K/uL — ABNORMAL LOW (ref 4.0–10.5)
nRBC: 0 % (ref 0.0–0.2)

## 2023-10-30 LAB — PROTIME-INR
INR: 1.6 — ABNORMAL HIGH (ref 0.8–1.2)
Prothrombin Time: 20.2 s — ABNORMAL HIGH (ref 11.4–15.2)

## 2023-10-30 MED ORDER — INSULIN REGULAR HUMAN (CONC) 500 UNIT/ML ~~LOC~~ SOPN
60.0000 [IU] | PEN_INJECTOR | Freq: Three times a day (TID) | SUBCUTANEOUS | Status: DC
  Administered 2023-10-30 – 2023-10-31 (×3): 60 [IU] via SUBCUTANEOUS
  Filled 2023-10-30: qty 3

## 2023-10-30 MED ORDER — POTASSIUM CHLORIDE CRYS ER 20 MEQ PO TBCR
40.0000 meq | EXTENDED_RELEASE_TABLET | Freq: Once | ORAL | Status: AC
  Administered 2023-10-30: 40 meq via ORAL
  Filled 2023-10-30: qty 2

## 2023-10-30 MED ORDER — METRONIDAZOLE 500 MG PO TABS
500.0000 mg | ORAL_TABLET | Freq: Two times a day (BID) | ORAL | Status: DC
  Administered 2023-10-30 – 2023-10-31 (×2): 500 mg via ORAL
  Filled 2023-10-30 (×2): qty 1

## 2023-10-30 MED ORDER — OXYCODONE-ACETAMINOPHEN 7.5-325 MG PO TABS
1.0000 | ORAL_TABLET | Freq: Four times a day (QID) | ORAL | Status: DC | PRN
  Administered 2023-10-30: 1 via ORAL
  Filled 2023-10-30: qty 1

## 2023-10-30 MED ORDER — SIMETHICONE 80 MG PO CHEW
80.0000 mg | CHEWABLE_TABLET | Freq: Four times a day (QID) | ORAL | Status: DC | PRN
  Administered 2023-10-30: 80 mg via ORAL
  Filled 2023-10-30: qty 1

## 2023-10-30 NOTE — Plan of Care (Signed)

## 2023-10-30 NOTE — Progress Notes (Signed)
 Progress Note   Patient: Laurie Sutton FMW:[id number] DOB: March 20, 1981 DOA: 10/28/2023     1 DOS: the patient was seen and examined on 10/30/2023   Brief hospital course: 42-yo f phx of decompensated hepatic cirrhosis, hyperammonemia, portal hypertensive gastropathy, esophageal varices, C. difficile and Campylobacter jejuni associated GI infection, left-sided ovarian cyst, chronic dyspnea secondary to volume overload, depression, anxiety, reactive airway disease, insulin -dependent DM type II, chronic pancytopenia, gout, hypoalbuminemia, hyperammonemia, and morbid obesity presents ED w/ abdominal pain and diarrhea.Patient reports recent history of C. difficile.  She states she completed antibiotics and had improvement of symptoms.  Over the last 2 days she has had increasing worsening crampy abdominal pain.  She reports multiple loose stools.  It feels similar to when she had C. difficile.  She reports fevers at home and chills.  Her last hospital admission on 6/25 she was admitted for rule out SBP.  Was noted to have Clostridium difficile and Campylobacter.  Completed 10-day course of Dificid .   ED course: Vitals show soft BP. CMP showing low sodium 131, low bicarb 20, elevated AST and bilirubin 3.5 which is around baseline.  Normal lipase level.  CT A/P showed cirrhosis with splenomegaly, splenic varices. Moderate to marked severity abdomen pelvic ascites.  Cholelithiasis.  Severe colitis of the ascending colon. Adrenal gland cyst.  Given ceftriaxone  1 g and Fidaxomicin . Had diagnostic paracentesis which showed cell count 270, neutrophil only 34%. [facility] has been consulted by Dr. Bari. [facility] consulted for admission.  Assessment and Plan: Severe Colitis Abdominal cramping and diarrhea - History of C. difficile and Campylobacter: Presented with abdominal pain and diarrhea - She finished 10-day course of Dificid . Started on Dificid  in the ER - CT abdomen showed marked severity colitis involving  the ascending colon. Moderate to marked severity abdominopelvic ascites and cholelithiasis. - C. difficile panel negative, discontinue Dificid  - GI symptoms likely due to recent increase in lactulose  versus gastroenteritis - Continue IV Rocephin , add Flagyl  - Oxycodone  as needed for pain - Follow-up GI panel - Enteric precautions  NASH hepatic cirrhosis-decompensated History of portal hypertensive gastropathy Esophageal varices: Hyperbilirubinemia Ascites - S/p paracentesis by IR on admission with 2.7 L out - Ascites fluid studies does not show any evidence of SBP - GI following, appreciate recs - Continue home dose of lactulose , Lasix  and spironolactone . - Continue PPI - Trend LFTs  Insulin -dependent type 2 diabetes:  - Last A1c 6.1% 3 weeks ago - CBG significantly elevated to the 220s to 290s - Reports home regimen is Humulin  R U-500 60 units with breakfast, 65 units with lunch and 65 units with dinner - Resume Humulin  R U-500 60 units 3 times daily with meals - SSI with meals, CBG monitoring   Chronic anemia Chronic thrombocytopenia:  - In the setting of liver cirrhosis - No signs of active bleed, continue to monitor   Left ovarian cyst: - Noted on CT abdomen.  - No follow-up needed per radiology  Depression/anxiety:  - Continue Prozac      Subjective: Patient evaluated at bedside laying comfortably in bed.  She reports increased gas and intermittent mild abdominal cramping but improved compared to yesterday. Has not had any bowel movement this morning. Denies any fever, chills, nausea or vomiting.  Physical Exam: Vitals:   10/29/23 1343 10/29/23 1655 10/29/23 2108 10/30/23 0422  BP:  (!) 90/59 (!) 104/54 (!) 91/54  Pulse:  82 74 71  Resp:  18 16 18   Temp: 98.1 F (36.7 C) 98.3 F (36.8  C) 98.1 F (36.7 C) 98.1 F (36.7 C)  TempSrc: Oral  Oral   SpO2:  94% 96% 94%  Weight:      Height:       General: Pleasant, well-appearing obese woman laying in bed. No  acute distress. HEENT: [place]/AT. Anicteric sclera CV: RRR. No murmurs, rubs, or gallops. No LE edema Pulmonary: Lungs CTAB. Normal effort. No wheezing or rales. Abdominal: Soft, mild abdominal distention.  Minimal generalized tenderness.  Normal bowel sounds. Extremities: Palpable radial and DP pulses. Normal ROM. Skin: Warm and dry. No obvious rash or lesions. Neuro: A&Ox3. Moves all extremities. Normal sensation to light touch. No focal deficit. Psych: Normal mood and affect  Data Reviewed:     Latest Ref Rng & Units 10/30/2023    5:00 AM 10/28/2023    7:33 PM 10/20/2023   11:14 AM  CBC  WBC 4.0 - 10.5 K/uL 2.0  5.2  2.6   Hemoglobin 12.0 - 15.0 g/dL 89.2  88.8  89.1   Hematocrit 36.0 - 46.0 % 32.7  33.3  31.3   Platelets 150 - 400 K/uL 57  71  63.0       Latest Ref Rng & Units 10/30/2023    5:00 AM 10/28/2023    7:33 PM 10/20/2023   11:14 AM  CMP  Glucose 70 - 99 mg/dL 808  789  873   BUN 6 - 20 mg/dL 5  8  7    Creatinine 0.44 - 1.00 mg/dL 9.28  9.16  9.31   Sodium 135 - 145 mmol/L 135  131  134   Potassium 3.5 - 5.1 mmol/L 3.2  3.6  3.4   Chloride 98 - 111 mmol/L 100  102  98   CO2 22 - 32 mmol/L 22  20  28    Calcium 8.9 - 10.3 mg/dL 8.5  8.4  8.8   Total Protein 6.5 - 8.1 g/dL 6.8  6.7  7.0   Total Bilirubin 0.0 - 1.2 mg/dL 2.8  3.5  3.9   Alkaline Phos 38 - 126 U/L 84  91  92   AST 15 - 41 U/L 36  53  35   ALT 0 - 44 U/L 24  31  23        Family Communication: No family at bedside  Disposition: Status is: Inpatient Remains inpatient appropriate because: Management of colitis  Planned Discharge Destination: Home    Time spent: 38 minutes  Author: Claretta CHRISTELLA Alderman, MD 10/30/2023 8:01 AM  For on call review [email or web address].

## 2023-10-30 NOTE — Progress Notes (Addendum)
 Inpatient Progress Note     Patient Profile/Chief Complaint  43 year old female with a history of decompensated [name] complicated by portal hypertension and hepatic encephalopathy, recent C. difficile and Campylobacter infection in June 2025 treated with Dificid  admitted with fever, abdominal pain, diarrhea. Completed 10-day course of Dificid  and symptoms had been improving.    Interval History   -- No bowel movement since yesterday-stool studies have not been able to be collected -- On lactulose  -less sleepy than yesterday and no overt hepatic encephalopathy  Noted that her father was admitted to the hospital last night also with diarrhea    Objective   Vital signs in last 24 hours: Temp:  [98.1 F (36.7 C)-98.3 F (36.8 C)] 98.2 F (36.8 C) (07/20 0830) Pulse Rate:  [71-88] 73 (07/20 0830) Resp:  [14-20] 19 (07/20 0830) BP: (90-106)/(52-63) 100/52 (07/20 0830) SpO2:  [94 %-100 %] 95 % (07/20 0830) Last BM Date : 10/28/23 General:    Alert, resting in bed no distress Heart:  Regular rate and rhythm; no murmurs Lungs: Respirations even and unlabored, lungs CTA bilaterally Abdomen:  Soft, nontender and nondistended. Normal bowel sounds. Extremities:  Without edema. Neurologic:  Alert and oriented,  grossly normal neurologically. Psych:  Cooperative. Normal mood and affect.  Intake/Output from previous day: 07/19 0701 - 07/20 0700 In: 240 [P.O.:240] Out: 0  Intake/Output this shift: Total I/O In: 240 [P.O.:240] Out: -   Lab Results: Recent Labs    10/28/23 1933 10/30/23 0500  WBC 5.2 2.0*  HGB 11.1* 10.7*  HCT 33.3* 32.7*  PLT 71* 57*   BMET Recent Labs    10/28/23 1933 10/30/23 0500  NA 131* 135  K 3.6 3.2*  CL 102 100  CO2 20* 22  GLUCOSE 210* 191*  BUN 8 <5*  CREATININE 0.83 0.71  CALCIUM 8.4* 8.5*   LFT Recent Labs    10/30/23 0500  PROT 6.8  ALBUMIN  3.1*  AST 36  ALT 24  ALKPHOS 84  BILITOT 2.8*   PT/INR Recent Labs     10/29/23 1601 10/30/23 0500  LABPROT 21.5* 20.2*  INR 1.8* 1.6*   AFP 7.8 Ammonia 60  Paracentesis fluid Total cell count 249 Neutrophils 3 Gram stain no WBC, no organisms  Studies/Results: US  Paracentesis Result Date: 10/29/2023 INDICATION: Patient with history of NASH cirrhosis, esophageal varices, portal hypertension, colitis, peritonitis, ascites. Request received for therapeutic paracentesis. EXAM: ULTRASOUND GUIDED THERAPEUTIC  PARACENTESIS MEDICATIONS: 8 mL 1% lidocaine  COMPLICATIONS: None immediate. PROCEDURE: Informed written consent was obtained from the patient after a discussion of the risks, benefits and alternatives to treatment. A timeout was performed prior to the initiation of the procedure. Initial ultrasound scanning demonstrates a small to moderate amount of ascites within the right lower abdominal quadrant. The right lower abdomen was prepped and draped in the usual sterile fashion. 1% lidocaine  was used for local anesthesia. Following this, a 19 gauge,10-cm, Yueh catheter was introduced. An ultrasound image was saved for documentation purposes. The paracentesis was performed. The catheter was removed and a dressing was applied. The patient tolerated the procedure well without immediate post procedural complication. FINDINGS: A total of approximately 2.7 liters of amber fluid was removed. IMPRESSION: Successful ultrasound-guided therapeutic paracentesis yielding 2.7 liters of peritoneal fluid. PLAN: The patient has required >/=2 paracenteses in a 30 day period and a formal evaluation by the [facility] has been arranged. Performed by: Franky Kelsie RIGGERS Electronically Signed   By: Juliene Philip HERO.D.  On: 10/29/2023 14:45   CT ABDOMEN PELVIS W CONTRAST Result Date: 10/29/2023 CLINICAL DATA:  Abdominal pain. EXAM: CT ABDOMEN AND PELVIS WITH CONTRAST TECHNIQUE: Multidetector CT imaging of the abdomen and pelvis was performed using  the standard protocol following bolus administration of intravenous contrast. RADIATION DOSE REDUCTION: This exam was performed according to the departmental dose-optimization program which includes automated exposure control, adjustment of the mA and/or kV according to patient size and/or use of iterative reconstruction technique. CONTRAST:  75mL OMNIPAQUE  IOHEXOL  350 MG/ML SOLN COMPARISON:  October 04, 2023 FINDINGS: Lower chest: No acute abnormality. Hepatobiliary: The liver is cirrhotic in appearance without evidence of focal liver lesions. Tiny gallstones are seen without evidence of gallbladder wall thickening, pericholecystic inflammation or biliary dilatation. Pancreas: Unremarkable. No pancreatic ductal dilatation or surrounding inflammatory changes. Spleen: There is mild to moderate severity splenomegaly. Adrenals/Urinary Tract: Adrenal glands are unremarkable. Kidneys are normal, without renal calculi, focal lesion, or hydronephrosis. Bladder is unremarkable. Stomach/Bowel: Stomach is within normal limits. Appendix appears normal. No evidence of bowel dilatation. The wall of the ascending colon is markedly thickened. Vascular/Lymphatic: Stable splenic varices are seen. No additional significant vascular findings are present. No enlarged abdominal or pelvic lymph nodes. Reproductive: Status post hysterectomy. A 2.0 cm cyst is seen within the left adnexa. The right adnexa is unremarkable. Other: No abdominal wall hernia or abnormality. Moderate to marked severity abdominopelvic ascites is seen. Musculoskeletal: No acute or significant osseous findings. IMPRESSION: 1. Cirrhosis with splenomegaly and splenic varices. 2. Moderate to marked severity abdominopelvic ascites. 3. Marked severity colitis involving the ascending colon. 4. Cholelithiasis. 5. 2.0 cm left adnexal cyst, likely ovarian in origin. No follow-up imaging is recommended. This recommendation follows ACR consensus guidelines: White Paper of the ACR  Incidental Findings Committee II on Adnexal Findings. J Am Coll Radiol [phone]. Electronically Signed   By: Suzen Dials M.D.   On: 10/29/2023 01:23    Endoscopic Studies: None   Clinical Impression   43 year old female with a history of decompensated [name] complicated by portal hypertension and hepatic encephalopathy, recent C. difficile and Campylobacter infection in June 2025 treated with Dificid  admitted with fever, abdominal pain, diarrhea. Completed 10-day course of Dificid  and symptoms had been improving.   CT with wall thickening of the colon -current infection versus residual inflammation from recent C. difficile 09/2023 Diagnostic paracentesis negative for SBP  Dificid  resumed on admission given symptoms and recent C. difficile.  Noted that her father was admitted to the hospital last night also with diarrhea.  Laurie Sutton's clinical presentation is most concerning for a form of infectious gastroenteritis given recent C. difficile and Campylobacter infection last month. She has not had any bowel movements over the last 24 hours.  As such, stool studies have not been able to be obtained for enteric pathogens.  The rapid resolution of her symptoms would also support the likelihood of her having underlying a form of infectious enteritis.  She was sleepy yesterday but appears less somnolent today from a hepatic encephalopathy perspective.  Outpatient workup shows elevated AFP 7.8 -outpatient MRI has been coordinated by hepatology providers at [facility]   Plan  Monitor stool output If diarrhea resumes obtain GI stool pathogen panel and C. difficile quick screen C. difficile negative today-discontinue Dificid  Continue lactulose  30 cc p.o. 3 times daily -this can be titrated for her to have 3-4 bowel movements per day Continue diuretics: Lasix  80 mg p.o. daily and Aldactone  300 mg p.o. daily-monitor renal function Consider addition of carvedilol  on discharge when  clinically stable.  Patient has been established with [facility] hepatology, and will continue follow-up there   Dr. Nandigam and [name], NP will assume rounding responsibilities 10/31/2023    LOS: 1 day   Inocente CHRISTELLA Hausen  10/30/2023, 12:05 PM  Inocente Hausen, MD [facility]

## 2023-10-31 ENCOUNTER — Inpatient Hospital Stay (HOSPITAL_COMMUNITY)

## 2023-10-31 DIAGNOSIS — R188 Other ascites: Secondary | ICD-10-CM | POA: Diagnosis not present

## 2023-10-31 DIAGNOSIS — K7290 Hepatic failure, unspecified without coma: Secondary | ICD-10-CM

## 2023-10-31 DIAGNOSIS — K529 Noninfective gastroenteritis and colitis, unspecified: Secondary | ICD-10-CM | POA: Diagnosis not present

## 2023-10-31 DIAGNOSIS — R1084 Generalized abdominal pain: Secondary | ICD-10-CM | POA: Diagnosis not present

## 2023-10-31 DIAGNOSIS — K7682 Hepatic encephalopathy: Secondary | ICD-10-CM

## 2023-10-31 DIAGNOSIS — K7460 Unspecified cirrhosis of liver: Secondary | ICD-10-CM

## 2023-10-31 LAB — GASTROINTESTINAL PANEL BY PCR, STOOL (REPLACES STOOL CULTURE)

## 2023-10-31 LAB — COMPREHENSIVE METABOLIC PANEL WITH GFR
ALT: 24 U/L (ref 0–44)
AST: 39 U/L (ref 15–41)
Albumin: 2.8 g/dL — ABNORMAL LOW (ref 3.5–5.0)
Alkaline Phosphatase: 90 U/L (ref 38–126)
Anion gap: 9 (ref 5–15)
BUN: 5 mg/dL — ABNORMAL LOW (ref 6–20)
CO2: 25 mmol/L (ref 22–32)
Calcium: 8.4 mg/dL — ABNORMAL LOW (ref 8.9–10.3)
Chloride: 99 mmol/L (ref 98–111)
Creatinine, Ser: 0.64 mg/dL (ref 0.44–1.00)
GFR, Estimated: >60 mL/min (ref >60)
Glucose, Bld: 149 mg/dL — ABNORMAL HIGH (ref 70–99)
Potassium: 3.7 mmol/L (ref 3.5–5.1)
Sodium: 133 mmol/L — ABNORMAL LOW (ref 135–145)
Total Bilirubin: 1.9 mg/dL — ABNORMAL HIGH (ref 0.0–1.2)
Total Protein: 6.4 g/dL — ABNORMAL LOW (ref 6.5–8.1)

## 2023-10-31 LAB — CBC
HCT: 30.8 % — ABNORMAL LOW (ref 36.0–46.0)
Hemoglobin: 10.3 g/dL — ABNORMAL LOW (ref 12.0–15.0)
MCH: 31.5 pg (ref 26.0–34.0)
MCHC: 33.4 g/dL (ref 30.0–36.0)
MCV: 94.2 fL (ref 80.0–100.0)
Platelets: 66 K/uL — ABNORMAL LOW (ref 150–400)
RBC: 3.27 MIL/uL — ABNORMAL LOW (ref 3.87–5.11)
RDW: 14.3 % (ref 11.5–15.5)
WBC: 2.4 K/uL — ABNORMAL LOW (ref 4.0–10.5)
nRBC: 0 % (ref 0.0–0.2)

## 2023-10-31 LAB — GLUCOSE, CAPILLARY
Glucose-Capillary: 176 mg/dL — ABNORMAL HIGH (ref 70–99)
Glucose-Capillary: 250 mg/dL — ABNORMAL HIGH (ref 70–99)
Glucose-Capillary: 101 mg/dL — ABNORMAL HIGH (ref 70–99)

## 2023-10-31 MED ORDER — SPIRONOLACTONE 100 MG PO TABS: 300.0000 mg | ORAL_TABLET | Freq: Every day | ORAL | 0 refills | Status: DC

## 2023-10-31 MED ORDER — LACTULOSE 10 GM/15ML PO SOLN
10.0000 g | Freq: Three times a day (TID) | ORAL | 0 refills | Status: AC
Start: 2023-10-31 — End: 2023-11-30

## 2023-10-31 NOTE — Plan of Care (Signed)

## 2023-10-31 NOTE — Progress Notes (Addendum)
  GASTROENTEROLOGY ROUNDING NOTE   Subjective: Feeling better.  Complains of gas cramps.  She is on lactulose .   Objective: Vital signs in last 24 hours: Temp:  [98 F (36.7 C)-98.6 F (37 C)] 98 F (36.7 C) (07/21 0756) Pulse Rate:  [71-76] 71 (07/21 0756) Resp:  [16-18] 18 (07/21 0756) BP: (100-125)/(49-57) 125/54 (07/21 0756) SpO2:  [90 %-100 %] 97 % (07/21 0756) Last BM Date : 10/29/23 General: NAD, AO x 3, no asterixis Abdomen: Soft, mild distention, no tenderness or rebound Ext: No edema    Intake/Output from previous day: Feels 07/20 0701 - 07/21 0700 In: 580 [P.O.:480; IV Piggyback:100] Out: -  Intake/Output this shift: Total I/O In: 500 [P.O.:500] Out: 0    Lab Results: Recent Labs    10/28/23 1933 10/30/23 0500 10/31/23 0541  WBC 5.2 2.0* 2.4*  HGB 11.1* 10.7* 10.3*  PLT 71* 57* 66*  MCV 94.9 96.5 94.2   BMET Recent Labs    10/28/23 1933 10/30/23 0500 10/31/23 0541  NA 131* 135 133*  K 3.6 3.2* 3.7  CL 102 100 99  CO2 20* 22 25  GLUCOSE 210* 191* 149*  BUN 8 <5* 5*  CREATININE 0.83 0.71 0.64  CALCIUM 8.4* 8.5* 8.4*   LFT Recent Labs    10/28/23 1933 10/30/23 0500 10/31/23 0541  PROT 6.7 6.8 6.4*  ALBUMIN  2.7* 3.1* 2.8*  AST 53* 36 39  ALT 31 24 24   ALKPHOS 91 84 90  BILITOT 3.5* 2.8* 1.9*   PT/INR Recent Labs    10/29/23 1601 10/30/23 0500  INR 1.8* 1.6*      Imaging/Other results: No results found.    Assessment &Plan  43 year old female with decompensated [name] presented with abdominal pain and diarrhea. Abdominal pain has improved, she is having intermittent gas cramps  MELD 3.0: 18 at 10/31/2023  5:41 AM  C. difficile and GI path panel negative  Colitis on CT could be over read due to bowel wall edema in the setting of cirrhosis with hypoalbuminemia and colopathy DC broad-spectrum antibiotics ceftriaxone  and Flagyl   S/p paracentesis with removal of 2.7 L fluid, negative for SBP On  diuretics, Lasix  80 and Aldactone  300 mg daily Hold off repeat paracentesis  Hepatic encephalopathy stable no asterixis, alert oriented x 3 Decrease lactulose  to 10 g p.o. 3 times daily with goal 3 soft bowel movements per day  Hemoglobin stable, no overt bleeding.  Patient wants to go home, okay to discharge from GI standpoint Follow-up with hepatology clinic at [facility] on discharge    K. Wilkie Mcgee , MD [phone]  [facility]

## 2023-10-31 NOTE — Plan of Care (Signed)

## 2023-10-31 NOTE — Discharge Summary (Signed)
 Physician Discharge Summary   Patient: Laurie Sutton MRN: [id number] DOB: May 15, 1980  Admit date:     10/28/2023  Discharge date: 10/31/23  Discharge Physician: ATLEE ABERNETHY    PCP: [name], MD      Discharge Diagnoses: Principal Problem:   Colitis Active Problems:   Liver cirrhosis secondary to NASH (HCC)   SBP (spontaneous bacterial peritonitis) (HCC)   Generalized abdominal pain   Encephalopathy, hepatic (HCC)   Decompensated cirrhosis (HCC)  Resolved Problems:   * No resolved hospital problems. *  Hospital Course:  43 yo F admitted for what appeared to be severe colitis. GI was consulted. C.diff and GI panel were negative. She was continued on IV ceftriaxone  and flagyl . For the NASH with ascites the pt received  Lasix  80 mg PO daily, Aldactone  300 mg PO daily, Lactulose  20 g PO tid and Protonix  40 mg PO daily. Paracentesis was completed and removed 2.7 L.  GI Consult note 10/31/2023: C. difficile and GI path panel negative Colitis on CT could be over read due to bowel wall edema in the setting of cirrhosis with hypoalbuminemia and colopathy DC broad-spectrum antibiotics ceftriaxone  and Flagyl   S/p paracentesis with removal of 2.7 L fluid, negative for SBP On diuretics, Lasix  80 and Aldactone  300 mg daily Hold off repeat paracentesis  Hepatic encephalopathy stable no asterixis, alert oriented x 3 Decrease lactulose  to 10 g p.o. 3 times daily with goal 3 soft bowel movements per day Hemoglobin stable, no overt bleeding.  Patient wants to go home, okay to discharge from GI standpoint Follow-up with hepatology clinic at [facility] on discharge K. Wilkie Mcgee , MD [phone]  [facility]   As a result, pt will be discharged 10/31/2023 with scripts for lactulose  and aldactone . Please follow up with GI as an outpt.  DISCHARGE MEDICATION: Allergies as of 10/31/2023       Reactions   Vancomycin  Itching   Amoxicillin Hives, Itching   Azithromycin      DOES NOT WORK   Dulaglutide  Other (See Comments)   constipation and stomach issues  **Trulicity **   Empagliflozin -metformin  Hcl Er Other (See Comments), Swelling   Januvia  [sitagliptin ] Other (See Comments)   HURTS STOAMCH   Latex    IRRITATES VAGINAL AREA   Metformin     Other Reaction(s): achy all over   Penicillins Hives   Has patient had a PCN reaction causing immediate rash, facial/tongue/throat swelling, SOB or lightheadedness with hypotension: yes. Rash  Has patient had a PCN reaction causing severe rash involving mucus membranes or skin necrosis: Yes- rash and hives all over body  Has patient had a PCN reaction that required hospitalization No Has patient had a PCN reaction occurring within the last 10 years: Yes  If all of the above answers are NO, then may proceed with Cephalosporin use.        Medication List     STOP taking these medications    potassium chloride  SA 20 MEQ tablet Commonly known as: KLOR-CON  M       TAKE these medications    acetaminophen  325 MG tablet Commonly known as: TYLENOL  Take 2 tablets (650 mg total) by mouth every 6 (six) hours as needed for mild pain (pain score 1-3) or fever (or Fever >/= 101).   albuterol  108 (90 Base) MCG/ACT inhaler Commonly known as: VENTOLIN  HFA INHALE 2 PUFFS INTO THE LUNGS UP TO EVERY 6 HOURS AS NEEDED FOR WHEEZING/SHORTNESS OF BREATH   clotrimazole -betamethasone  cream Commonly known as: LOTRISONE  APPLY  1 APPLICATION TOPICALLY TWICE A DAY AS NEEDED   Dexcom G7 Sensor Misc Use 1 sensor for continuous glucose monitoring every 10 days for 30 days   FLUoxetine  40 MG capsule Commonly known as: PROZAC  TAKE 1 CAPSULE BY MOUTH TWICE A DAY   furosemide  80 MG tablet Commonly known as: LASIX  Take 1 tablet (80 mg total) by mouth daily.   HumuLIN  R U-500 KwikPen 500 UNIT/ML KwikPen Generic drug: insulin  regular human CONCENTRATED Use 120 units at breakfast, 100 at lunch, and 100 at dinner What changed:  additional instructions   lactulose  10 GM/15ML solution Commonly known as: CHRONULAC  Take 15 mLs (10 g total) by mouth 3 (three) times daily. What changed:  how much to take when to take this   montelukast  10 MG tablet Commonly known as: SINGULAIR  Take 1 tablet (10 mg total) by mouth at bedtime.   Mounjaro  7.5 MG/0.5ML Pen Generic drug: tirzepatide  Inject 7.5 mg Subcutaneous once a week 30 days   omeprazole  40 MG capsule Commonly known as: PRILOSEC TAKE 1 CAPSULE (40 MG TOTAL) BY MOUTH IN THE MORNING   ondansetron  4 MG disintegrating tablet Commonly known as: ZOFRAN -ODT 1 tablet on the tongue and allow to dissolve by mouth three times a day; Duration: 30 days   ondansetron  4 MG tablet Commonly known as: Zofran  Take 1 tablet (4 mg total) by mouth every 8 (eight) hours as needed for nausea or vomiting.   spironolactone  100 MG tablet Commonly known as: ALDACTONE  Take 3 tablets (300 mg total) by mouth daily. Start taking on: November 01, 2023        Discharge Exam: Laurie Sutton   10/28/23 1926  Weight: 89.4 kg   Physical Exam HENT:     Head: Normocephalic.  Cardiovascular:     Rate and Rhythm: Normal rate.  Pulmonary:     Effort: Pulmonary effort is normal.  Abdominal:     Comments: Soft but slightly distended   Skin:    General: Skin is warm.  Neurological:     Mental Status: She is alert and oriented to person, place, and time.  Psychiatric:        Mood and Affect: Mood normal.    Condition at discharge: fair  The results of significant diagnostics from this hospitalization (including imaging, microbiology, ancillary and laboratory) are listed below for reference.   Imaging Studies: US  Paracentesis Result Date: 10/29/2023 INDICATION: Patient with history of NASH cirrhosis, esophageal varices, portal hypertension, colitis, peritonitis, ascites. Request received for therapeutic paracentesis. EXAM: ULTRASOUND GUIDED THERAPEUTIC  PARACENTESIS MEDICATIONS: 8 mL  1% lidocaine  COMPLICATIONS: None immediate. PROCEDURE: Informed written consent was obtained from the patient after a discussion of the risks, benefits and alternatives to treatment. A timeout was performed prior to the initiation of the procedure. Initial ultrasound scanning demonstrates a small to moderate amount of ascites within the right lower abdominal quadrant. The right lower abdomen was prepped and draped in the usual sterile fashion. 1% lidocaine  was used for local anesthesia. Following this, a 19 gauge,10-cm, Yueh catheter was introduced. An ultrasound image was saved for documentation purposes. The paracentesis was performed. The catheter was removed and a dressing was applied. The patient tolerated the procedure well without immediate post procedural complication. FINDINGS: A total of approximately 2.7 liters of amber fluid was removed. IMPRESSION: Successful ultrasound-guided therapeutic paracentesis yielding 2.7 liters of peritoneal fluid. PLAN: The patient has required >/=2 paracenteses in a 30 day period and a formal evaluation by the [facility] Interventional Radiology Portal Hypertension  Clinic has been arranged. Performed by: Franky Kelsie RIGGERS Electronically Signed   By: Juliene Balder M.D.   On: 10/29/2023 14:45   CT ABDOMEN PELVIS W CONTRAST Result Date: 10/29/2023 CLINICAL DATA:  Abdominal pain. EXAM: CT ABDOMEN AND PELVIS WITH CONTRAST TECHNIQUE: Multidetector CT imaging of the abdomen and pelvis was performed using the standard protocol following bolus administration of intravenous contrast. RADIATION DOSE REDUCTION: This exam was performed according to the departmental dose-optimization program which includes automated exposure control, adjustment of the mA and/or kV according to patient size and/or use of iterative reconstruction technique. CONTRAST:  75mL OMNIPAQUE  IOHEXOL  350 MG/ML SOLN COMPARISON:  October 04, 2023 FINDINGS: Lower chest: No acute abnormality. Hepatobiliary: The liver is  cirrhotic in appearance without evidence of focal liver lesions. Tiny gallstones are seen without evidence of gallbladder wall thickening, pericholecystic inflammation or biliary dilatation. Pancreas: Unremarkable. No pancreatic ductal dilatation or surrounding inflammatory changes. Spleen: There is mild to moderate severity splenomegaly. Adrenals/Urinary Tract: Adrenal glands are unremarkable. Kidneys are normal, without renal calculi, focal lesion, or hydronephrosis. Bladder is unremarkable. Stomach/Bowel: Stomach is within normal limits. Appendix appears normal. No evidence of bowel dilatation. The wall of the ascending colon is markedly thickened. Vascular/Lymphatic: Stable splenic varices are seen. No additional significant vascular findings are present. No enlarged abdominal or pelvic lymph nodes. Reproductive: Status post hysterectomy. A 2.0 cm cyst is seen within the left adnexa. The right adnexa is unremarkable. Other: No abdominal wall hernia or abnormality. Moderate to marked severity abdominopelvic ascites is seen. Musculoskeletal: No acute or significant osseous findings. IMPRESSION: 1. Cirrhosis with splenomegaly and splenic varices. 2. Moderate to marked severity abdominopelvic ascites. 3. Marked severity colitis involving the ascending colon. 4. Cholelithiasis. 5. 2.0 cm left adnexal cyst, likely ovarian in origin. No follow-up imaging is recommended. This recommendation follows ACR consensus guidelines: White Paper of the ACR Incidental Findings Committee II on Adnexal Findings. J Am Coll Radiol [phone]. Electronically Signed   By: Suzen Dials M.D.   On: 10/29/2023 01:23   IR Paracentesis Result Date: 10/20/2023 INDICATION: 43 year old female with NASH cirrhosis and recurrent ascites for diagnostic and therapeutic paracentesis. EXAM: ULTRASOUND GUIDED LEFT PARACENTESIS MEDICATIONS: 1% lidocaine  10 mL COMPLICATIONS: None immediate. PROCEDURE: Informed written consent was obtained  from the patient after a discussion of the risks, benefits and alternatives to treatment. A timeout was performed prior to the initiation of the procedure. Initial ultrasound scanning demonstrates a large amount of ascites within the right lower abdominal quadrant. The right lower abdomen was prepped and draped in the usual sterile fashion. 1% lidocaine  was used for local anesthesia. Following this, a 19 gauge, 7-cm, Yueh catheter was introduced. An ultrasound image was saved for documentation purposes. The paracentesis was performed. The catheter was removed and a dressing was applied. The patient tolerated the procedure well without immediate post procedural complication. Patient received post-procedure intravenous albumin ; see nursing notes for details. FINDINGS: A total of approximately 4.4 L of clear, yellow fluid was removed. Samples were sent to the laboratory as requested by the clinical team. IMPRESSION: Successful ultrasound-guided paracentesis yielding 4.4 liters of peritoneal fluid. PLAN: Performed By Lavanda Jurist, PA-C Electronically Signed   By: Cordella Banner   On: 10/20/2023 15:22   IR Paracentesis Result Date: 10/11/2023 INDICATION: Patient with NASH cirrhosis and recurrent ascites. Request for diagnostic and therapeutic paracentesis. EXAM: ULTRASOUND GUIDED LEFT LOWER QUADRANT PARACENTESIS MEDICATIONS: 1% plain lidocaine , 5 mL COMPLICATIONS: None immediate. PROCEDURE: Informed written consent was obtained from the patient after a  discussion of the risks, benefits and alternatives to treatment. A timeout was performed prior to the initiation of the procedure. Initial ultrasound scanning demonstrates a large amount of ascites within the left lower abdominal quadrant. The left lower abdomen was prepped and draped in the usual sterile fashion. 1% lidocaine  was used for local anesthesia. Following this, a 19 gauge, 7-cm, Yueh catheter was introduced. An ultrasound image was saved for  documentation purposes. The paracentesis was performed. The catheter was removed and a dressing was applied. The patient tolerated the procedure well without immediate post procedural complication. FINDINGS: A total of approximately 6.5 L of hazy yellow fluid was removed. Samples were sent to the laboratory as requested by the clinical team. IMPRESSION: Successful ultrasound-guided paracentesis yielding 6 point liters of peritoneal fluid. Procedure performed by Franky Rusk PA-C and supervised by Dr. Cathlyn Banner Electronically Signed   By: Cordella Banner   On: 10/11/2023 14:08   DG Chest 2 View Result Date: 10/10/2023 CLINICAL DATA:  Shortness of breath. EXAM: CHEST - 2 VIEW COMPARISON:  10/06/2023 FINDINGS: Low volume film. The lungs are clear without focal pneumonia, edema, pneumothorax or pleural effusion. Cardiopericardial silhouette is at upper limits of normal for size. No acute bony abnormality. IMPRESSION: No active cardiopulmonary disease. Electronically Signed   By: Camellia Candle M.D.   On: 10/10/2023 13:59   DG CHEST PORT 1 VIEW Result Date: 10/06/2023 CLINICAL DATA:  [id number] SOB (shortness of breath) EXAM: PORTABLE CHEST 1 VIEW COMPARISON:  Chest radiograph 10/04/2023 and chest CTA 10/04/2023 FINDINGS: Both lungs are clear. No focal airspace disease or lung consolidation. Heart and mediastinum are within normal limits. Trachea is midline. Patient is slightly rotated towards the left. Negative for a pneumothorax. IMPRESSION: No acute cardiopulmonary disease. Electronically Signed   By: Juliene Balder M.D.   On: 10/06/2023 09:10   ECHOCARDIOGRAM COMPLETE Result Date: 10/05/2023    ECHOCARDIOGRAM REPORT   Patient Name:   Laurie Sutton Date of Exam: 10/05/2023 Medical Rec #:  [id number]         Height:       66.0 in Accession #:    [id number]        Weight:       220.0 lb Date of Birth:  [birth year]        BSA:          2.082 m Patient Age:    42 years          BP:           106/54 mmHg Patient  Gender: F                 HR:           71 bpm. Exam Location:  Inpatient Procedure: 2D Echo, Cardiac Doppler and Color Doppler (Both Spectral and Color            Flow Doppler were utilized during procedure). Indications:    Murmor R01.1  History:        Patient has no prior history of Echocardiogram examinations.                 Migraines; Risk Factors:Diabetes.  Sonographer:    Thea Norlander RCS Referring Phys: SUBRINA SUNDIL IMPRESSIONS  1. Left ventricular ejection fraction, by estimation, is 60 to 65%. The left ventricle has normal function. The left ventricle has no regional wall motion abnormalities. Left ventricular diastolic parameters are consistent with Grade II diastolic dysfunction (pseudonormalization).  2. Right  ventricular systolic function is normal. The right ventricular size is normal. There is normal pulmonary artery systolic pressure.  3. Left atrial size was moderately dilated.  4. The mitral valve is normal in structure. Trivial mitral valve regurgitation. No evidence of mitral stenosis.  5. The aortic valve is normal in structure. Aortic valve regurgitation is not visualized. No aortic stenosis is present.  6. The inferior vena cava is dilated in size with <50% respiratory variability, suggesting right atrial pressure of 15 mmHg. FINDINGS  Left Ventricle: Left ventricular ejection fraction, by estimation, is 60 to 65%. The left ventricle has normal function. The left ventricle has no regional wall motion abnormalities. The left ventricular internal cavity size was normal in size. There is  no left ventricular hypertrophy. Left ventricular diastolic parameters are consistent with Grade II diastolic dysfunction (pseudonormalization). Right Ventricle: The right ventricular size is normal. No increase in right ventricular wall thickness. Right ventricular systolic function is normal. There is normal pulmonary artery systolic pressure. The tricuspid regurgitant velocity is 2.47 m/s, and  with  an assumed right atrial pressure of 10 mmHg, the estimated right ventricular systolic pressure is 34.4 mmHg. Left [facility]: Left atrial size was moderately dilated. Right [facility]: Right atrial size was normal in size. Pericardium: There is no evidence of pericardial effusion. Mitral Valve: The mitral valve is normal in structure. Trivial mitral valve regurgitation. No evidence of mitral valve stenosis. Tricuspid Valve: The tricuspid valve is normal in structure. Tricuspid valve regurgitation is mild . No evidence of tricuspid stenosis. Aortic Valve: The aortic valve is normal in structure. Aortic valve regurgitation is not visualized. No aortic stenosis is present. Aortic valve peak gradient measures 14.4 mmHg. Pulmonic Valve: The pulmonic valve was normal in structure. Pulmonic valve regurgitation is not visualized. No evidence of pulmonic stenosis. Aorta: The aortic root is normal in size and structure. Venous: The inferior vena cava is dilated in size with less than 50% respiratory variability, suggesting right atrial pressure of 15 mmHg. IAS/Shunts: No atrial level shunt detected by color flow Doppler.  LEFT VENTRICLE PLAX 2D LVIDd:         5.20 cm   Diastology LVIDs:         2.90 cm   LV e' medial:    8.70 cm/s LV PW:         0.70 cm   LV E/e' medial:  12.9 LV IVS:        0.80 cm   LV e' lateral:   13.30 cm/s LVOT diam:     2.20 cm   LV E/e' lateral: 8.4 LV SV:         112 LV SV Index:   54 LVOT Area:     3.80 cm  RIGHT VENTRICLE             IVC RV S prime:     17.10 cm/s  IVC diam: 2.20 cm TAPSE (M-mode): 3.0 cm LEFT ATRIUM              Index        RIGHT ATRIUM           Index LA diam:        4.60 cm  2.21 cm/m   RA Area:     15.50 cm LA Vol (A2C):   63.6 ml  30.54 ml/m  RA Volume:   39.30 ml  18.87 ml/m LA Vol (A4C):   116.0 ml 55.70 ml/m LA Biplane Vol: 89.5 ml  42.98  ml/m  AORTIC VALVE AV Area (Vmax): 2.54 cm AV Vmax:        190.00 cm/s AV Peak Grad:   14.4 mmHg LVOT Vmax:      127.00 cm/s LVOT Vmean:      85.600 cm/s LVOT VTI:       0.294 m  AORTA Ao Root diam: 3.00 cm Ao Asc diam:  3.30 cm MITRAL VALVE                TRICUSPID VALVE MV Area (PHT): 2.91 cm     TR Peak grad:   24.4 mmHg MV Decel Time: 261 msec     TR Vmax:        247.00 cm/s MV E velocity: 112.00 cm/s MV A velocity: 89.10 cm/s   SHUNTS MV E/A ratio:  1.26         Systemic VTI:  0.29 m                             Systemic Diam: 2.20 cm Aditya Sabharwal Electronically signed by Ria Commander Signature Date/Time: 10/05/2023/5:35:57 PM    Final    IR Paracentesis Result Date: 10/05/2023 INDICATION: Patient with a history of MASH cirrhosis with recurrent ascites. Request for therapeutic paracentesis. EXAM: ULTRASOUND GUIDED DIAGNOSTIC AND THERAPEUTIC PARACENTESIS MEDICATIONS: 6 mL 1% lidocaine  COMPLICATIONS: None immediate. PROCEDURE: Informed written consent was obtained from the patient after a discussion of the risks, benefits and alternatives to treatment. A timeout was performed prior to the initiation of the procedure. Initial ultrasound scanning demonstrates a large amount of ascites within the left lower abdominal quadrant. The left lower abdomen was prepped and draped in the usual sterile fashion. 1% lidocaine  was used for local anesthesia. Following this, a 19 gauge, 7-cm, Yueh catheter was introduced. An ultrasound image was saved for documentation purposes. The paracentesis was performed. The catheter was removed and a dressing was applied. The patient tolerated the procedure well without immediate post procedural complication. FINDINGS: A total of approximately 3 liters of clear, yellow fluid was removed. Samples were sent to the laboratory as requested by the clinical team. IMPRESSION: Successful ultrasound-guided paracentesis yielding 3 liters of peritoneal fluid. PLAN: If the patient eventually requires >/=2 paracenteses in a 30 day period, candidacy for formal evaluation by the [facility] Interventional Radiology Portal  Hypertension Clinic will be assessed. Performed by Laymon Coast, NP Electronically Signed   By: Cordella Banner   On: 10/05/2023 15:29   CT Angio Chest PE W and/or Wo Contrast Result Date: 10/04/2023 CLINICAL DATA:  High probability for PE. Non alcoholic cirrhosis. Chills and weakness. Acute abdominal pain. EXAM: CT ANGIOGRAPHY CHEST CT ABDOMEN AND PELVIS WITH CONTRAST TECHNIQUE: Multidetector CT imaging of the chest was performed using the standard protocol during bolus administration of intravenous contrast. Multiplanar CT image reconstructions and MIPs were obtained to evaluate the vascular anatomy. Multidetector CT imaging of the abdomen and pelvis was performed using the standard protocol during bolus administration of intravenous contrast. RADIATION DOSE REDUCTION: This exam was performed according to the departmental dose-optimization program which includes automated exposure control, adjustment of the mA and/or kV according to patient size and/or use of iterative reconstruction technique. CONTRAST:  75mL OMNIPAQUE  IOHEXOL  350 MG/ML SOLN COMPARISON:  CT abdomen and pelvis 06/06/2023 FINDINGS: CTA CHEST FINDINGS Cardiovascular: Satisfactory opacification of the pulmonary arteries to the segmental level. No evidence of pulmonary embolism. Heart is borderline enlarged/mildly enlarged. No pericardial effusion. Mediastinum/Nodes: No enlarged  mediastinal, hilar, or axillary lymph nodes. Thyroid  gland, trachea, and esophagus demonstrate no significant findings. Lungs/Pleura: There is mild atelectasis in the bilateral lower lobes and lingula. There is no pleural effusion or pneumothorax. Musculoskeletal: No chest wall abnormality. No acute or significant osseous findings. Review of the MIP images confirms the above findings. CT ABDOMEN and PELVIS FINDINGS Hepatobiliary: There is nodular liver contour, unchanged. Gallstones are present. There is no biliary ductal dilatation. Pancreas: Unremarkable. No  pancreatic ductal dilatation or surrounding inflammatory changes. Spleen: Spleen is enlarged measuring up to 18 cm similar to prior. Adrenals/Urinary Tract: There are 2 subcentimeter hypodensities in the left kidney which are too small to characterize, likely cysts. Otherwise, the kidneys, adrenal glands and bladder are within normal limits. Stomach/Bowel: There is diffuse wall thickening of the ascending colon, transverse colon and descending colon. The appendix is within normal limits. No bowel obstruction, pneumatosis or free air visualized. Stomach is within normal limits. Vascular/Lymphatic: Aorta and IVC are normal in size. Splenic varices are present. No enlarged lymph nodes are identified. Reproductive: 4.8 cm cyst with hyperdense fluid fluid level noted in the left ovary. Right ovary is within normal limits. The uterus is surgically absent. Other: There is large volume ascites. There is no focal abdominal wall hernia or free air. Musculoskeletal: No fracture is seen. Review of the MIP images confirms the above findings. IMPRESSION: 1. No evidence for pulmonary embolism. 2. Mild bibasilar atelectasis. 3. Cirrhotic liver with sequela of portal hypertension including splenomegaly, varices and large volume ascites. 4. Diffuse wall thickening of the ascending, transverse and descending colon worrisome for colitis. 5. 4.8 cm left ovarian hemorrhagic cyst. This can be further evaluated with pelvic ultrasound. 6. Cholelithiasis. Electronically Signed   By: Greig Pique M.D.   On: 10/04/2023 21:47   CT ABDOMEN PELVIS W CONTRAST Result Date: 10/04/2023 CLINICAL DATA:  High probability for PE. Non alcoholic cirrhosis. Chills and weakness. Acute abdominal pain. EXAM: CT ANGIOGRAPHY CHEST CT ABDOMEN AND PELVIS WITH CONTRAST TECHNIQUE: Multidetector CT imaging of the chest was performed using the standard protocol during bolus administration of intravenous contrast. Multiplanar CT image reconstructions and MIPs were  obtained to evaluate the vascular anatomy. Multidetector CT imaging of the abdomen and pelvis was performed using the standard protocol during bolus administration of intravenous contrast. RADIATION DOSE REDUCTION: This exam was performed according to the departmental dose-optimization program which includes automated exposure control, adjustment of the mA and/or kV according to patient size and/or use of iterative reconstruction technique. CONTRAST:  75mL OMNIPAQUE  IOHEXOL  350 MG/ML SOLN COMPARISON:  CT abdomen and pelvis 06/06/2023 FINDINGS: CTA CHEST FINDINGS Cardiovascular: Satisfactory opacification of the pulmonary arteries to the segmental level. No evidence of pulmonary embolism. Heart is borderline enlarged/mildly enlarged. No pericardial effusion. Mediastinum/Nodes: No enlarged mediastinal, hilar, or axillary lymph nodes. Thyroid  gland, trachea, and esophagus demonstrate no significant findings. Lungs/Pleura: There is mild atelectasis in the bilateral lower lobes and lingula. There is no pleural effusion or pneumothorax. Musculoskeletal: No chest wall abnormality. No acute or significant osseous findings. Review of the MIP images confirms the above findings. CT ABDOMEN and PELVIS FINDINGS Hepatobiliary: There is nodular liver contour, unchanged. Gallstones are present. There is no biliary ductal dilatation. Pancreas: Unremarkable. No pancreatic ductal dilatation or surrounding inflammatory changes. Spleen: Spleen is enlarged measuring up to 18 cm similar to prior. Adrenals/Urinary Tract: There are 2 subcentimeter hypodensities in the left kidney which are too small to characterize, likely cysts. Otherwise, the kidneys, adrenal glands and bladder are within  normal limits. Stomach/Bowel: There is diffuse wall thickening of the ascending colon, transverse colon and descending colon. The appendix is within normal limits. No bowel obstruction, pneumatosis or free air visualized. Stomach is within normal limits.  Vascular/Lymphatic: Aorta and IVC are normal in size. Splenic varices are present. No enlarged lymph nodes are identified. Reproductive: 4.8 cm cyst with hyperdense fluid fluid level noted in the left ovary. Right ovary is within normal limits. The uterus is surgically absent. Other: There is large volume ascites. There is no focal abdominal wall hernia or free air. Musculoskeletal: No fracture is seen. Review of the MIP images confirms the above findings. IMPRESSION: 1. No evidence for pulmonary embolism. 2. Mild bibasilar atelectasis. 3. Cirrhotic liver with sequela of portal hypertension including splenomegaly, varices and large volume ascites. 4. Diffuse wall thickening of the ascending, transverse and descending colon worrisome for colitis. 5. 4.8 cm left ovarian hemorrhagic cyst. This can be further evaluated with pelvic ultrasound. 6. Cholelithiasis. Electronically Signed   By: Greig Pique M.D.   On: 10/04/2023 21:47   DG Chest 2 View Result Date: 10/04/2023 CLINICAL DATA:  Possible sepsis EXAM: CHEST - 2 VIEW COMPARISON:  04/14/2022 FINDINGS: Cardiac shadow is within normal limits. Minimal left basilar atelectasis is noted. No bony abnormality is seen. IMPRESSION: Minimal left basilar atelectasis. Electronically Signed   By: Oneil Devonshire M.D.   On: 10/04/2023 20:47    Microbiology: Results for orders placed or performed during the hospital encounter of 10/28/23  Body fluid culture w Gram Stain     Status: None (Preliminary result)   Collection Time: 10/29/23  1:32 AM   Specimen: Peritoneal Cavity; Peritoneal Fluid  Result Value Ref Range Status   Specimen Description PERITONEAL CAVITY  Final   Special Requests NONE  Final   Gram Stain   Final    NO ORGANISMS SEEN WBC PRESENT, PREDOMINANTLY PMN CYTOSPIN SMEAR    Culture   Final    NO GROWTH 2 DAYS Performed at [facility], [address] [place], [place] [address]    Report Status PENDING  Incomplete  Gastrointestinal Panel  by PCR , Stool     Status: None   Collection Time: 10/29/23  1:33 AM   Specimen: Stool  Result Value Ref Range Status   Campylobacter species NOT DETECTED NOT DETECTED Final   Plesimonas shigelloides NOT DETECTED NOT DETECTED Final   Salmonella species NOT DETECTED NOT DETECTED Final   Yersinia enterocolitica NOT DETECTED NOT DETECTED Final   Vibrio species NOT DETECTED NOT DETECTED Final   Vibrio cholerae NOT DETECTED NOT DETECTED Final   Enteroaggregative E coli (EAEC) NOT DETECTED NOT DETECTED Final   Enteropathogenic E coli (EPEC) NOT DETECTED NOT DETECTED Final   Enterotoxigenic E coli (ETEC) NOT DETECTED NOT DETECTED Final   Shiga like toxin producing E coli (STEC) NOT DETECTED NOT DETECTED Final   Shigella/Enteroinvasive E coli (EIEC) NOT DETECTED NOT DETECTED Final   Cryptosporidium NOT DETECTED NOT DETECTED Final   Cyclospora cayetanensis NOT DETECTED NOT DETECTED Final   Entamoeba histolytica NOT DETECTED NOT DETECTED Final   Giardia lamblia NOT DETECTED NOT DETECTED Final   Adenovirus F40/41 NOT DETECTED NOT DETECTED Final   Astrovirus NOT DETECTED NOT DETECTED Final   Norovirus GI/GII NOT DETECTED NOT DETECTED Final   Rotavirus A NOT DETECTED NOT DETECTED Final   Sapovirus (I, II, IV, and V) NOT DETECTED NOT DETECTED Final    Comment: Performed at [facility], [address] [place], [place] [address]  C Difficile Quick Screen w PCR reflex     Status: None   Collection Time: 10/29/23  6:15 AM   Specimen: Stool  Result Value Ref Range Status   C Diff antigen NEGATIVE NEGATIVE Final   C Diff toxin NEGATIVE NEGATIVE Final   C Diff interpretation No C. difficile detected.  Final    Comment: Performed at [facility], [address] [place], [place] [address]    Labs: CBC: Recent Labs  Lab 10/28/23 1933 10/30/23 0500 10/31/23 0541  WBC 5.2 2.0* 2.4*  NEUTROABS 3.8  --   --   HGB 11.1* 10.7* 10.3*  HCT 33.3* 32.7* 30.8*  MCV 94.9 96.5 94.2  PLT  71* 57* 66*   Basic Metabolic Panel: Recent Labs  Lab 10/28/23 1933 10/29/23 0919 10/30/23 0500 10/31/23 0541  NA 131*  --  135 133*  K 3.6  --  3.2* 3.7  CL 102  --  100 99  CO2 20*  --  22 25  GLUCOSE 210*  --  191* 149*  BUN 8  --  <5* 5*  CREATININE 0.83  --  0.71 0.64  CALCIUM 8.4*  --  8.5* 8.4*  MG  --  1.7  --   --   PHOS  --  3.0  --   --    Liver Function Tests: Recent Labs  Lab 10/28/23 1933 10/30/23 0500 10/31/23 0541  AST 53* 36 39  ALT 31 24 24   ALKPHOS 91 84 90  BILITOT 3.5* 2.8* 1.9*  PROT 6.7 6.8 6.4*  ALBUMIN  2.7* 3.1* 2.8*   CBG: Recent Labs  Lab 10/30/23 1643 10/30/23 2052 10/31/23 0759 10/31/23 1114 10/31/23 1710  GLUCAP 225* 313* 176* 250* 101*    Discharge time spent: greater than 30 minutes.  Signed: Rashunda Passon , MD [facility] 10/31/2023

## 2023-10-31 NOTE — Progress Notes (Signed)
 Progress Note   Patient: Laurie Sutton FMW:[id number] DOB: [birth year] DOA: 10/28/2023     2 DOS: the patient was seen and examined on 10/31/2023   Brief hospital course: 42-yo f phx of decompensated hepatic cirrhosis, hyperammonemia, portal hypertensive gastropathy, esophageal varices, C. difficile and Campylobacter jejuni associated GI infection, left-sided ovarian cyst, chronic dyspnea secondary to volume overload, depression, anxiety, reactive airway disease, insulin -dependent DM type II, chronic pancytopenia, gout, hypoalbuminemia, hyperammonemia, and morbid obesity presents ED w/ abdominal pain and diarrhea.Patient reports recent history of C. difficile.  She states she completed antibiotics and had improvement of symptoms.  Over the last 2 days she has had increasing worsening crampy abdominal pain.  She reports multiple loose stools.  It feels similar to when she had C. difficile.  She reports fevers at home and chills.  Her last hospital admission on 6/25 she was admitted for rule out SBP.  Was noted to have Clostridium difficile and Campylobacter.  Completed 10-day course of Dificid .    ED course: Vitals show soft BP. CMP showing low sodium 131, low bicarb 20, elevated AST and bilirubin 3.5 which is around baseline.  Normal lipase level.  CT A/P showed cirrhosis with splenomegaly, splenic varices. Moderate to marked severity abdomen pelvic ascites.  Cholelithiasis.  Severe colitis of the ascending colon. Adrenal gland cyst.  Given ceftriaxone  1 g and Fidaxomicin . Had diagnostic paracentesis which showed cell count 270, neutrophil only 34%. [facility] has been consulted by Dr. Bari. [facility] consulted for admission.  Assessment and Plan: Severe colitis  - C.diff and GI panel negative  - IV ceftriaxone  2 g daily  - Flagyl  500 mg PO q12  - Percocet PRN   NASH, ascites, esophageal varices  - U/S abd ordered 10/31/2023 to monitor the amount of ascites (and consideration of another  paracentesis)  - Lasix  80 mg PO daily  - Aldactone  300 mg PO daily  - Lactulose  20 g PO tid  - Protonix  40 mg PO daily   DM2 - Novolog  SS ACHS - Regular insulin  500 unit/ml 60 units sq tid w/ meals   Chronic anemia & thrombocytopenia  - Hgb is stable  - PLTs rising 57 -->66   L ovarian cyst  - Monitor   Depression/anxiety  - Prozac  40 mg PO bid   Subjective: Pt seen and examined at the bedside. Pt still not feeling well enough for discharge. U/S abd ordered to see if pt will require another paracentesis. Continue ceftriaxone  and flagyl .   Physical Exam: Vitals:   10/30/23 1646 10/30/23 2051 10/31/23 0430 10/31/23 0756  BP: (!) 100/57 (!) 103/49 (!) 104/51 (!) 125/54  Pulse: 71 76 72 71  Resp: 18 18 16 18   Temp: 98.3 F (36.8 C) 98.6 F (37 C) 98.3 F (36.8 C) 98 F (36.7 C)  TempSrc:   Oral Oral  SpO2: 90% 100% 98% 97%  Weight:      Height:       Physical Exam HENT:     Head: Normocephalic.  Cardiovascular:     Rate and Rhythm: Normal rate.  Pulmonary:     Effort: Pulmonary effort is normal.  Abdominal:     Comments: Soft but slightly distended   Skin:    General: Skin is warm.  Neurological:     Mental Status: She is alert and oriented to person, place, and time.  Psychiatric:        Mood and Affect: Mood normal.      Disposition: Status is:  Inpatient Remains inpatient appropriate because: IV antibx and consideration of another paracentesis   Planned Discharge Destination: Home    Time spent: 35 minutes  Author: Orbin Mayeux , MD 10/31/2023 10:34 AM  For on call review [email or web address].

## 2023-10-31 NOTE — Progress Notes (Signed)
 Discharged summary reviewed with patient. Patient verbalized understanding. Belongings returned to patient. PIV removed from right arm, Patient expressed satisfaction with treatment provided from hospital staff.

## 2023-11-01 ENCOUNTER — Telehealth: Payer: Self-pay

## 2023-11-01 LAB — BODY FLUID CULTURE W GRAM STAIN
Culture: NO GROWTH
Gram Stain: NONE SEEN

## 2023-11-01 NOTE — Transitions of Care (Post Inpatient/ED Visit) (Signed)
   11/01/2023  Name: LUCRECIA MCPHEARSON MRN: [id number] DOB: March 02, 1981  Today's TOC FU Call Status: Today's TOC FU Call Status:: Unsuccessful Call (1st Attempt) Unsuccessful Call (1st Attempt) Date: 11/01/23  Attempted to reach the patient regarding the most recent Inpatient/ED visit.  Follow Up Plan: Additional outreach attempts will be made to reach the patient to complete the Transitions of Care (Post Inpatient/ED visit) call.   Alan Ee, RN, BSN, CEN [employer]- Transition of Care Team.  [facility] [phone]

## 2023-11-02 ENCOUNTER — Encounter: Admitting: Podiatry

## 2023-11-02 ENCOUNTER — Telehealth: Payer: Self-pay

## 2023-11-02 NOTE — Transitions of Care (Post Inpatient/ED Visit) (Signed)
   11/02/2023  Name: Laurie Sutton MRN: [id number] DOB: Jun 06, 1980  Today's TOC FU Call Status: Today's TOC FU Call Status:: Unsuccessful Call (2nd Attempt) Unsuccessful Call (2nd Attempt) Date: 11/02/23  Attempted to reach the patient regarding the most recent Inpatient/ED visit.  Follow Up Plan: Additional outreach attempts will be made to reach the patient to complete the Transitions of Care (Post Inpatient/ED visit) call.   Alan Ee, RN, BSN, CEN [employer]- Transition of Care Team.  [facility] [phone]

## 2023-11-03 ENCOUNTER — Telehealth: Payer: Self-pay

## 2023-11-03 NOTE — Transitions of Care (Post Inpatient/ED Visit) (Signed)
   11/03/2023  Name: Laurie Sutton MRN: [id number] DOB: [birth year]  Today's TOC FU Call Status: Today's TOC FU Call Status:: Unsuccessful Call (3rd Attempt) Unsuccessful Call (3rd Attempt) Date: 11/03/23  Attempted to reach the patient regarding the most recent Inpatient/ED visit.  Follow Up Plan: No further outreach attempts will be made at this time. We have been unable to contact the patient.  Alan Ee, RN, BSN, CEN [employer]- Transition of Care Team.  [facility] [phone]

## 2023-11-08 ENCOUNTER — Ambulatory Visit (HOSPITAL_COMMUNITY)
Admission: RE | Admit: 2023-11-08 | Discharge: 2023-11-08 | Disposition: A | Discharge status: Home / Self Care | Source: Ambulatory Visit | Attending: Nurse Practitioner | Admitting: Nurse Practitioner

## 2023-11-08 DIAGNOSIS — R188 Other ascites: Secondary | ICD-10-CM | POA: Insufficient documentation

## 2023-11-08 HISTORY — PX: IR PARACENTESIS: IMG2679

## 2023-11-08 MED ORDER — ALBUMIN HUMAN 25 % IV SOLN
12.5000 g | INTRAVENOUS | Status: DC | PRN
  Administered 2023-11-08: 25 g via INTRAVENOUS

## 2023-11-08 MED ORDER — LIDOCAINE-EPINEPHRINE 1 %-1:100000 IJ SOLN
INTRAMUSCULAR | Status: AC
Start: 2023-11-08 — End: 2023-11-08
  Filled 2023-11-08: qty 1

## 2023-11-08 MED ORDER — ALBUMIN HUMAN 25 % IV SOLN
INTRAVENOUS | Status: AC
  Filled 2023-11-08: qty 100

## 2023-11-08 NOTE — Procedures (Signed)
 PROCEDURE SUMMARY:  Successful ultrasound guided paracentesis from the left lower quadrant.  Yielded 6 L of clear yellow fluid.  No immediate complications.  The patient tolerated the procedure well.   Specimen not sent for labs.  EBL < 2 mL  ** Patient actively undergoing work up for liver transplant with [facility].   Warren Dais, AGACNP-BC 11/08/2023, 11:54 AM

## 2023-11-14 ENCOUNTER — Other Ambulatory Visit

## 2023-11-18 ENCOUNTER — Other Ambulatory Visit: Payer: Self-pay

## 2023-11-18 ENCOUNTER — Inpatient Hospital Stay (HOSPITAL_COMMUNITY)

## 2023-11-18 ENCOUNTER — Encounter (HOSPITAL_COMMUNITY): Payer: Self-pay

## 2023-11-18 ENCOUNTER — Inpatient Hospital Stay: Admission: RE | Admit: 2023-11-18 | Source: Ambulatory Visit

## 2023-11-18 ENCOUNTER — Inpatient Hospital Stay (HOSPITAL_COMMUNITY)
Admission: EM | Admit: 2023-11-18 | Discharge: 2023-11-22 | DRG: 394 | Disposition: A | Discharge status: Home / Self Care | Source: Ambulatory Visit | Attending: Internal Medicine | Admitting: Internal Medicine

## 2023-11-18 DIAGNOSIS — I8500 Esophageal varices without bleeding: Secondary | ICD-10-CM | POA: Diagnosis not present

## 2023-11-18 DIAGNOSIS — K766 Portal hypertension: Secondary | ICD-10-CM | POA: Diagnosis present

## 2023-11-18 DIAGNOSIS — Z8052 Family history of malignant neoplasm of bladder: Secondary | ICD-10-CM | POA: Diagnosis not present

## 2023-11-18 DIAGNOSIS — Z79899 Other long term (current) drug therapy: Secondary | ICD-10-CM | POA: Diagnosis not present

## 2023-11-18 DIAGNOSIS — Z825 Family history of asthma and other chronic lower respiratory diseases: Secondary | ICD-10-CM

## 2023-11-18 DIAGNOSIS — E1142 Type 2 diabetes mellitus with diabetic polyneuropathy: Secondary | ICD-10-CM | POA: Diagnosis present

## 2023-11-18 DIAGNOSIS — Z91040 Latex allergy status: Secondary | ICD-10-CM

## 2023-11-18 DIAGNOSIS — I8510 Secondary esophageal varices without bleeding: Secondary | ICD-10-CM | POA: Diagnosis present

## 2023-11-18 DIAGNOSIS — K648 Other hemorrhoids: Secondary | ICD-10-CM | POA: Diagnosis not present

## 2023-11-18 DIAGNOSIS — Z7901 Long term (current) use of anticoagulants: Secondary | ICD-10-CM

## 2023-11-18 DIAGNOSIS — Z8616 Personal history of COVID-19: Secondary | ICD-10-CM

## 2023-11-18 DIAGNOSIS — D61818 Other pancytopenia: Secondary | ICD-10-CM | POA: Diagnosis present

## 2023-11-18 DIAGNOSIS — K7581 Nonalcoholic steatohepatitis (NASH): Secondary | ICD-10-CM | POA: Diagnosis present

## 2023-11-18 DIAGNOSIS — Z833 Family history of diabetes mellitus: Secondary | ICD-10-CM

## 2023-11-18 DIAGNOSIS — E119 Type 2 diabetes mellitus without complications: Secondary | ICD-10-CM | POA: Diagnosis not present

## 2023-11-18 DIAGNOSIS — Z8261 Family history of arthritis: Secondary | ICD-10-CM | POA: Diagnosis not present

## 2023-11-18 DIAGNOSIS — R791 Abnormal coagulation profile: Secondary | ICD-10-CM | POA: Diagnosis present

## 2023-11-18 DIAGNOSIS — I959 Hypotension, unspecified: Secondary | ICD-10-CM | POA: Diagnosis present

## 2023-11-18 DIAGNOSIS — E876 Hypokalemia: Secondary | ICD-10-CM | POA: Diagnosis present

## 2023-11-18 DIAGNOSIS — K7290 Hepatic failure, unspecified without coma: Secondary | ICD-10-CM | POA: Diagnosis present

## 2023-11-18 DIAGNOSIS — K7682 Hepatic encephalopathy: Secondary | ICD-10-CM | POA: Diagnosis not present

## 2023-11-18 DIAGNOSIS — F418 Other specified anxiety disorders: Secondary | ICD-10-CM | POA: Diagnosis not present

## 2023-11-18 DIAGNOSIS — Z888 Allergy status to other drugs, medicaments and biological substances status: Secondary | ICD-10-CM

## 2023-11-18 DIAGNOSIS — K7460 Unspecified cirrhosis of liver: Secondary | ICD-10-CM | POA: Diagnosis present

## 2023-11-18 DIAGNOSIS — R188 Other ascites: Secondary | ICD-10-CM

## 2023-11-18 DIAGNOSIS — D6959 Other secondary thrombocytopenia: Secondary | ICD-10-CM | POA: Diagnosis present

## 2023-11-18 DIAGNOSIS — A419 Sepsis, unspecified organism: Secondary | ICD-10-CM | POA: Diagnosis not present

## 2023-11-18 DIAGNOSIS — Z880 Allergy status to penicillin: Secondary | ICD-10-CM

## 2023-11-18 DIAGNOSIS — Z818 Family history of other mental and behavioral disorders: Secondary | ICD-10-CM

## 2023-11-18 DIAGNOSIS — I10 Essential (primary) hypertension: Secondary | ICD-10-CM | POA: Diagnosis present

## 2023-11-18 DIAGNOSIS — K5900 Constipation, unspecified: Secondary | ICD-10-CM | POA: Diagnosis present

## 2023-11-18 DIAGNOSIS — Z881 Allergy status to other antibiotic agents status: Secondary | ICD-10-CM

## 2023-11-18 DIAGNOSIS — Z87440 Personal history of urinary (tract) infections: Secondary | ICD-10-CM

## 2023-11-18 DIAGNOSIS — K625 Hemorrhage of anus and rectum: Principal | ICD-10-CM

## 2023-11-18 DIAGNOSIS — Z90710 Acquired absence of both cervix and uterus: Secondary | ICD-10-CM

## 2023-11-18 DIAGNOSIS — K922 Gastrointestinal hemorrhage, unspecified: Secondary | ICD-10-CM | POA: Diagnosis present

## 2023-11-18 DIAGNOSIS — Z83719 Family history of colon polyps, unspecified: Secondary | ICD-10-CM

## 2023-11-18 DIAGNOSIS — Z87442 Personal history of urinary calculi: Secondary | ICD-10-CM

## 2023-11-18 DIAGNOSIS — Z8701 Personal history of pneumonia (recurrent): Secondary | ICD-10-CM

## 2023-11-18 DIAGNOSIS — K649 Unspecified hemorrhoids: Secondary | ICD-10-CM | POA: Diagnosis not present

## 2023-11-18 DIAGNOSIS — D696 Thrombocytopenia, unspecified: Secondary | ICD-10-CM | POA: Diagnosis not present

## 2023-11-18 LAB — COMPREHENSIVE METABOLIC PANEL WITH GFR
ALT: 25 U/L (ref 0–44)
AST: 39 U/L (ref 15–41)
Albumin: 2.7 g/dL — ABNORMAL LOW (ref 3.5–5.0)
Alkaline Phosphatase: 94 U/L (ref 38–126)
Anion gap: 13 (ref 5–15)
BUN: 5 mg/dL — ABNORMAL LOW (ref 6–20)
CO2: 23 mmol/L (ref 22–32)
Calcium: 8.2 mg/dL — ABNORMAL LOW (ref 8.9–10.3)
Chloride: 96 mmol/L — ABNORMAL LOW (ref 98–111)
Creatinine, Ser: 0.75 mg/dL (ref 0.44–1.00)
GFR, Estimated: >60 mL/min (ref >60)
Glucose, Bld: 266 mg/dL — ABNORMAL HIGH (ref 70–99)
Potassium: 2.7 mmol/L — CL (ref 3.5–5.1)
Sodium: 132 mmol/L — ABNORMAL LOW (ref 135–145)
Total Bilirubin: 3.8 mg/dL — ABNORMAL HIGH (ref 0.0–1.2)
Total Protein: 6.6 g/dL (ref 6.5–8.1)

## 2023-11-18 LAB — CBC
HCT: 31.4 % — ABNORMAL LOW (ref 36.0–46.0)
Hemoglobin: 10.6 g/dL — ABNORMAL LOW (ref 12.0–15.0)
MCH: 31.2 pg (ref 26.0–34.0)
MCHC: 33.8 g/dL (ref 30.0–36.0)
MCV: 92.4 fL (ref 80.0–100.0)
Platelets: 61 K/uL — ABNORMAL LOW (ref 150–400)
RBC: 3.4 MIL/uL — ABNORMAL LOW (ref 3.87–5.11)
RDW: 14.8 % (ref 11.5–15.5)
WBC: 2.6 K/uL — ABNORMAL LOW (ref 4.0–10.5)
nRBC: 0 % (ref 0.0–0.2)

## 2023-11-18 LAB — BASIC METABOLIC PANEL WITH GFR
Anion gap: 9 (ref 5–15)
BUN: <5 mg/dL — ABNORMAL LOW (ref 6–20)
CO2: 27 mmol/L (ref 22–32)
Calcium: 8.2 mg/dL — ABNORMAL LOW (ref 8.9–10.3)
Chloride: 96 mmol/L — ABNORMAL LOW (ref 98–111)
Creatinine, Ser: 0.69 mg/dL (ref 0.44–1.00)
GFR, Estimated: >60 mL/min (ref >60)
Glucose, Bld: 209 mg/dL — ABNORMAL HIGH (ref 70–99)
Potassium: 2.9 mmol/L — ABNORMAL LOW (ref 3.5–5.1)
Sodium: 132 mmol/L — ABNORMAL LOW (ref 135–145)

## 2023-11-18 LAB — TYPE AND SCREEN
ABO/RH(D): O POS
Antibody Screen: NEGATIVE

## 2023-11-18 LAB — POC OCCULT BLOOD, ED: Fecal Occult Bld: POSITIVE — AB

## 2023-11-18 LAB — PROTIME-INR
INR: 1.7 — ABNORMAL HIGH (ref 0.8–1.2)
Prothrombin Time: 21.2 s — ABNORMAL HIGH (ref 11.4–15.2)

## 2023-11-18 LAB — HEMOGLOBIN AND HEMATOCRIT, BLOOD
HCT: 30.6 % — ABNORMAL LOW (ref 36.0–46.0)
HCT: 28.6 % — ABNORMAL LOW (ref 36.0–46.0)
Hemoglobin: 10.3 g/dL — ABNORMAL LOW (ref 12.0–15.0)
Hemoglobin: 9.6 g/dL — ABNORMAL LOW (ref 12.0–15.0)

## 2023-11-18 LAB — GLUCOSE, CAPILLARY: Glucose-Capillary: 243 mg/dL — ABNORMAL HIGH (ref 70–99)

## 2023-11-18 LAB — MAGNESIUM: Magnesium: 1.6 mg/dL — ABNORMAL LOW (ref 1.7–2.4)

## 2023-11-18 MED ORDER — INSULIN ASPART 100 UNIT/ML IJ SOLN: 0.0000 [IU] | Freq: Three times a day (TID) | INTRAMUSCULAR | Status: DC

## 2023-11-18 MED ORDER — ALBUTEROL SULFATE HFA 108 (90 BASE) MCG/ACT IN AERS: 2.0000 | INHALATION_SPRAY | Freq: Four times a day (QID) | RESPIRATORY_TRACT | Status: DC | PRN

## 2023-11-18 MED ORDER — HYDRALAZINE HCL 20 MG/ML IJ SOLN: 5.0000 mg | INTRAMUSCULAR | Status: DC | PRN

## 2023-11-18 MED ORDER — MAGNESIUM SULFATE 2 GM/50ML IV SOLN
2.0000 g | Freq: Once | INTRAVENOUS | Status: AC
  Administered 2023-11-18: 2 g via INTRAVENOUS
  Filled 2023-11-18: qty 50

## 2023-11-18 MED ORDER — IOHEXOL 350 MG/ML SOLN
75.0000 mL | Freq: Once | INTRAVENOUS | Status: AC | PRN
  Administered 2023-11-18: 75 mL via INTRAVENOUS

## 2023-11-18 MED ORDER — ACETAMINOPHEN 325 MG PO TABS: 650.0000 mg | ORAL_TABLET | Freq: Four times a day (QID) | ORAL | Status: DC | PRN

## 2023-11-18 MED ORDER — PANTOPRAZOLE SODIUM 40 MG IV SOLR
40.0000 mg | Freq: Two times a day (BID) | INTRAVENOUS | Status: DC
  Administered 2023-11-18 – 2023-11-21 (×7): 40 mg via INTRAVENOUS
  Filled 2023-11-18 (×6): qty 10

## 2023-11-18 MED ORDER — ACETAMINOPHEN 650 MG RE SUPP: 650.0000 mg | Freq: Four times a day (QID) | RECTAL | Status: DC | PRN

## 2023-11-18 MED ORDER — LACTATED RINGERS IV SOLN: Freq: Once | INTRAVENOUS | Status: AC

## 2023-11-18 MED ORDER — INSULIN ASPART 100 UNIT/ML IJ SOLN
0.0000 [IU] | Freq: Three times a day (TID) | INTRAMUSCULAR | Status: DC
  Administered 2023-11-18 – 2023-11-19 (×2): 2 [IU] via SUBCUTANEOUS
  Administered 2023-11-19 – 2023-11-21 (×11): 1 [IU] via SUBCUTANEOUS
  Administered 2023-11-22 (×2): 2 [IU] via SUBCUTANEOUS

## 2023-11-18 MED ORDER — FLUOXETINE HCL 20 MG PO CAPS
40.0000 mg | ORAL_CAPSULE | Freq: Two times a day (BID) | ORAL | Status: DC
  Administered 2023-11-18 – 2023-11-20 (×4): 40 mg via ORAL
  Filled 2023-11-18 (×4): qty 2

## 2023-11-18 MED ORDER — MONTELUKAST SODIUM 10 MG PO TABS
10.0000 mg | ORAL_TABLET | Freq: Every day | ORAL | Status: DC
Start: 2023-11-18 — End: 2023-11-22
  Administered 2023-11-18 – 2023-11-21 (×5): 10 mg via ORAL
  Filled 2023-11-18 (×4): qty 1

## 2023-11-18 MED ORDER — ONDANSETRON HCL 4 MG PO TABS: 4.0000 mg | ORAL_TABLET | Freq: Four times a day (QID) | ORAL | Status: DC | PRN

## 2023-11-18 MED ORDER — ONDANSETRON HCL 4 MG/2ML IJ SOLN
4.0000 mg | Freq: Four times a day (QID) | INTRAMUSCULAR | Status: DC | PRN
  Administered 2023-11-19 – 2023-11-22 (×5): 4 mg via INTRAVENOUS
  Filled 2023-11-18 (×3): qty 2

## 2023-11-18 MED ORDER — POTASSIUM CHLORIDE 10 MEQ/100ML IV SOLN
10.0000 meq | INTRAVENOUS | Status: AC
  Administered 2023-11-18 (×2): 10 meq via INTRAVENOUS
  Filled 2023-11-18 (×2): qty 100

## 2023-11-18 MED ORDER — MORPHINE SULFATE (PF) 2 MG/ML IV SOLN: 2.0000 mg | INTRAVENOUS | Status: DC | PRN

## 2023-11-18 NOTE — ED Notes (Signed)
 Extra urine sent to main lab

## 2023-11-18 NOTE — ED Notes (Signed)
 Pt transported to imaging.

## 2023-11-18 NOTE — Telephone Encounter (Signed)
 Per VO from [place], since pt is seeing that much blood, she is recommending patient go to [facility] ED asap.   I spoke to patient and she was currently at [facility] to have labs drawn. I advised her of Dawn's recommendations. She has her MRI scheduled this afternoon and was concerned about missing that again and was going to wait until after she had the MRI done at 1p. I advised her that because she was seeing that much blood, and the fact that she mentioned she's feeling dizzy and light-headed, she needed to go to the ER now and not wait until this afternoon. Patient voiced understanding and appreciation.

## 2023-11-18 NOTE — Telephone Encounter (Signed)
 Pt sts she has blood in her stool

## 2023-11-18 NOTE — ED Notes (Signed)
 Dinner tray provided

## 2023-11-18 NOTE — ED Triage Notes (Signed)
 Pt states she has been having rectal bleeding that started last night. Pt states she is having pain and it is bright red in color. Pt states she feels lightheaded and sick on her stomach.

## 2023-11-18 NOTE — H&P (Signed)
 History and Physical    Patient: Laurie Sutton FMW:[id number] DOB: 09/19/1980 DOA: 11/18/2023 DOS: the patient was seen and examined on 11/18/2023 . PCP: [name], MD  Patient coming from: Home Chief complaint: Chief Complaint  Patient presents with   Rectal Bleeding   HPI:  Laurie Sutton is a 43 y.o. female with past medical history  of allergy to penicillin causing hives and itching and angioedema with a rash facial tongue throat swelling shortness of breath and lightheadedness with dizziness and hypotension, also allergic to vancomycin  amoxicillin azithromycin  dulaglutide , Januvia , metformin , Jardiance , latex, migraine, GERD, depression with anxiety, type 2 diabetes, decompensated liver cirrhosis secondary to NASH , splenic varices esophageal varices portal colopathy  esophageal varices portal hypertensive gastropathy, frequent UTI, hypertension, C. difficile colitis in June 2005 with a negative retest for C. difficile in July 2005.  Patient is being admitted today for rectal bleeding that started yesterday patient states that she does feel dizzy and has some weakness but no other than the abdominal distention and discomfort in the rectal bleeding she feels okay.  Patient is following up with hepatology clinic with [facility]: LOIS Wilkie Mcgee , MD [phone]  Last [facility] note reviewed and initiated liver transplant evaluation hepatoma screening I of the abdomen with NM, standing order for large-volume paracentesis weekly sodium restriction to 2 g Lasix  80 and Aldactone  150.  ED Course:  Vital signs in the ED were notable for the following:  Vitals:   11/18/23 1554 11/18/23 1730 11/18/23 1827 11/18/23 1842  BP:  (!) 106/58 107/60   Pulse:  84 82   Temp: 98.6 F (37 C)  98.4 F (36.9 C)   Resp:  (!) 22    Height:      Weight:      SpO2:  100%  95%  TempSrc: Oral  Oral   BMI (Calculated):      >>ED evaluation thus far shows: Cmp shows potassium of 2.7 glucose of  266 normal creatinine and lft.  Mag 1.6 CBC shows pancytopenia.     Latest Ref Rng & Units 11/18/2023   11:35 AM 10/31/2023    5:41 AM 10/30/2023    5:00 AM  CBC  WBC 4.0 - 10.5 K/uL 2.6  2.4  2.0   Hemoglobin 12.0 - 15.0 g/dL 89.3  89.6  89.2   Hematocrit 36.0 - 46.0 % 31.4  30.8  32.7   Platelets 150 - 400 K/uL 61  66  57   PT 21.2 INR 1.7.  >>While in the ED patient received the following: Medications  potassium chloride  10 mEq in 100 mL IVPB (10 mEq Intravenous New Bag/Given 11/18/23 1436)   Review of Systems  Constitutional:  Positive for malaise/fatigue.  Gastrointestinal:  Positive for abdominal pain, blood in stool and diarrhea.  All other systems reviewed and are negative.  Past Medical History:  Diagnosis Date   ADHD    Allergy    Anemia    IRON TRANSFUSION 07-2020 NONE SINCE   Anxiety    Back pain    COVID 08/12/2019   ALL SYMPTOMS REOLVED PER PT   Depression    Diabetic neuropathy (HCC) 11/11/2020   FEET   dm type 2    sees Dr. Odella Jacobson at [facility]   Fatty liver    GERD (gastroesophageal reflux disease)    History of kidney stones    Joint pain    Menorrhagia 11/11/2020   Migraines    Murmur, cardiac  FAINT NO CARDIOLOGIST   Other fatigue    Pneumonia 08/12/2019   COVID PNEUMONIA ALL SYMPTOMS RESOLVED PER PT   Shortness of breath on exertion    Past Surgical History:  Procedure Laterality Date   CESAREAN SECTION  12/13/2006   COLONOSCOPY WITH PROPOFOL  N/A 08/02/2023   Procedure: COLONOSCOPY WITH PROPOFOL ;  Surgeon: Legrand Victory LITTIE DOUGLAS, MD;  Location: [facility];  Service: Gastroenterology;  Laterality: N/A;   ESOPHAGOGASTRODUODENOSCOPY (EGD) WITH PROPOFOL  N/A 08/02/2023   Procedure: ESOPHAGOGASTRODUODENOSCOPY (EGD) WITH PROPOFOL ;  Surgeon: Legrand Victory LITTIE DOUGLAS, MD;  Location: [facility];  Service: Gastroenterology;  Laterality: N/A;   EXTRACORPOREAL SHOCK WAVE LITHOTRIPSY  2018   FOOT SURGERY Right 10/02/2020   RIGHT FOOT HEEL AND  TOES, SURGICAL CENTER OFF [address]   IR PARACENTESIS  07/06/2023   IR PARACENTESIS  08/18/2023   IR PARACENTESIS  10/05/2023   IR PARACENTESIS  10/11/2023   IR PARACENTESIS  10/20/2023   IR PARACENTESIS  11/08/2023   IR TRANSCATHETER BX  07/26/2023   IR US  GUIDE VASC ACCESS RIGHT  07/26/2023   IR VENOGRAM HEPATIC W HEMODYNAMIC EVALUATION  07/26/2023   LAPAROSCOPIC VAGINAL HYSTERECTOMY WITH SALPINGECTOMY Bilateral 11/17/2020   Procedure: LAPAROSCOPIC ASSISTED VAGINAL HYSTERECTOMY WITH BILATERAL SALPINGECTOMY;  Surgeon: Dannielle Bouchard, DO;  Location: [facility];  Service: Gynecology;  Laterality: Bilateral;   POLYPECTOMY  08/02/2023   Procedure: POLYPECTOMY, INTESTINE;  Surgeon: Legrand Victory LITTIE DOUGLAS, MD;  Location: [facility];  Service: Gastroenterology;;    reports that she has never smoked. She has been exposed to tobacco smoke. She has never used smokeless tobacco. She reports that she does not drink alcohol and does not use drugs. Allergies  Allergen Reactions   Vancomycin  Itching   Amoxicillin Hives and Itching   Azithromycin      DOES NOT WORK   Dulaglutide  Other (See Comments)    constipation and stomach issues  **Trulicity **    Empagliflozin -Metformin  Hcl Er Other (See Comments) and Swelling   Januvia  [Sitagliptin ] Other (See Comments)    HURTS STOAMCH   Latex     IRRITATES VAGINAL AREA   Metformin      Other Reaction(s): achy all over   Penicillins Hives    Has patient had a PCN reaction causing immediate rash, facial/tongue/throat swelling, SOB or lightheadedness with hypotension: yes. Rash  Has patient had a PCN reaction causing severe rash involving mucus membranes or skin necrosis: Yes- rash and hives all over body  Has patient had a PCN reaction that required hospitalization No Has patient had a PCN reaction occurring within the last 10 years: Yes  If all of the above answers are NO, then may proceed with Cephalosporin use.    Family History  Problem Relation  Age of Onset   Colon polyps Mother    Depression Mother    Anxiety disorder Mother    Colon polyps Father    Sleep apnea Father    Anxiety disorder Father    Depression Father    Diabetes Father    Bladder Cancer Father 55   Rheum arthritis Father    Migraines Maternal Aunt    Migraines Maternal Grandmother    Diabetes Maternal Grandfather    Healthy Daughter    Asthma Daughter    Anxiety disorder Daughter    Anxiety disorder Son    Asthma Son    Healthy Son    Cerebral aneurysm Cousin    Heart disease Other    Depression Other  Anxiety disorder Other    Sleep apnea Other    Colon cancer Neg Hx    Esophageal cancer Neg Hx    Stomach cancer Neg Hx    Rectal cancer Neg Hx    Prior to Admission medications   Medication Sig Start Date End Date Taking? Authorizing Provider  acetaminophen  (TYLENOL ) 325 MG tablet Take 2 tablets (650 mg total) by mouth every 6 (six) hours as needed for mild pain (pain score 1-3) or fever (or Fever >/= 101). 10/07/23   Sherrill Cable Latif, DO  albuterol  (VENTOLIN  HFA) 108 [phone] Base) MCG/ACT inhaler INHALE 2 PUFFS INTO THE LUNGS UP TO EVERY 6 HOURS AS NEEDED FOR WHEEZING/SHORTNESS OF BREATH 08/20/22   [name], MD  clotrimazole -betamethasone  (LOTRISONE ) cream APPLY 1 APPLICATION TOPICALLY TWICE A DAY AS NEEDED 05/26/23   [name], MD  Continuous Glucose Sensor (DEXCOM G7 SENSOR) MISC Use 1 sensor for continuous glucose monitoring every 10 days for 30 days 05/25/23   Braulio Hough, MD  FLUoxetine  (PROZAC ) 40 MG capsule TAKE 1 CAPSULE BY MOUTH TWICE A DAY 07/26/23   [name], MD  furosemide  (LASIX ) 80 MG tablet Take 1 tablet (80 mg total) by mouth daily. 10/11/23   Sherrill Cable Latif, DO  HUMULIN  R U-500 KWIKPEN 500 UNIT/ML KwikPen Use 120 units at breakfast, 100 at lunch, and 100 at dinner Patient taking differently: Use 60 units at breakfast, 65 at lunch, and 65 at dinner 04/08/22   [name], MD  lactulose  (CHRONULAC ) 10 GM/15ML  solution Take 15 mLs (10 g total) by mouth 3 (three) times daily. 10/31/23 11/30/23  Sira, Zackery, MD  montelukast  (SINGULAIR ) 10 MG tablet Take 1 tablet (10 mg total) by mouth at bedtime. 08/25/22   [name], MD  omeprazole  (PRILOSEC) 40 MG capsule TAKE 1 CAPSULE (40 MG TOTAL) BY MOUTH IN THE MORNING 10/23/23   Legrand Victory LITTIE DOUGLAS, MD  ondansetron  (ZOFRAN ) 4 MG tablet Take 1 tablet (4 mg total) by mouth every 8 (eight) hours as needed for nausea or vomiting. 10/18/23   Craig Alan SAUNDERS, PA-C  ondansetron  (ZOFRAN -ODT) 4 MG disintegrating tablet 1 tablet on the tongue and allow to dissolve by mouth three times a day; Duration: 30 days    [provider]  spironolactone  (ALDACTONE ) 100 MG tablet Take 3 tablets (300 mg total) by mouth daily. 11/01/23 12/01/23  Sira, Zackery, MD  tirzepatide  (MOUNJARO ) 7.5 MG/0.5ML Pen Inject 7.5 mg Subcutaneous once a week 30 days Patient not taking: Reported on 10/29/2023 07/06/23                                                                                    Vitals:   11/18/23 1554 11/18/23 1730 11/18/23 1827 11/18/23 1842  BP:  (!) 106/58 107/60   Pulse:  84 82   Resp:  (!) 22    Temp: 98.6 F (37 C)  98.4 F (36.9 C)   TempSrc: Oral  Oral   SpO2:  100%  95%  Weight:      Height:       Physical Exam Vitals reviewed.  Constitutional:  General: She is not in acute distress.    Appearance: She is not ill-appearing.  HENT:     Head: Normocephalic.  Eyes:     Extraocular Movements: Extraocular movements intact.  Cardiovascular:     Rate and Rhythm: Normal rate and regular rhythm.     Heart sounds: Normal heart sounds.  Pulmonary:     Breath sounds: Normal breath sounds.  Abdominal:     General: There is distension.     Palpations: Abdomen is soft.     Tenderness: There is abdominal tenderness.  Neurological:     General: No focal deficit present.     Mental Status: She is alert and oriented to person, place, and time.     Labs on  Admission: I have personally reviewed following labs and imaging studies CBC: Recent Labs  Lab 11/18/23 1135  WBC 2.6*  HGB 10.6*  HCT 31.4*  MCV 92.4  PLT 61*   Basic Metabolic Panel: Recent Labs  Lab 11/18/23 1135 11/18/23 1528  NA 132*  --   K 2.7*  --   CL 96*  --   CO2 23  --   GLUCOSE 266*  --   BUN 5*  --   CREATININE 0.75  --   CALCIUM 8.2*  --   MG  --  1.6*   GFR: Estimated Creatinine Clearance: 103.1 mL/min (by C-G formula based on SCr of 0.75 mg/dL). Liver Function Tests: Recent Labs  Lab 11/18/23 1135  AST 39  ALT 25  ALKPHOS 94  BILITOT 3.8*  PROT 6.6  ALBUMIN  2.7*   No results for input(s): LIPASE, AMYLASE in the last 168 hours. No results for input(s): AMMONIA in the last 168 hours. Recent Labs    10/05/23 0459 10/06/23 0552 10/07/23 0717 10/10/23 1504 10/11/23 0510 10/20/23 1114 10/28/23 1933 10/30/23 0500 10/31/23 0541 11/18/23 1135  BUN 12 12 7 7 6 7 8  <5* 5* 5*  CREATININE 1.00 0.71 0.71 0.73 0.64 0.68 0.83 0.71 0.64 0.75    Estimated Creatinine Clearance: 103.1 mL/min (by C-G formula based on SCr of 0.75 mg/dL).   Recent Labs    10/05/23 0459 10/06/23 0552 10/07/23 0717 10/10/23 1504 10/11/23 0510 10/20/23 1114 10/28/23 1933 10/30/23 0500 10/31/23 0541 11/18/23 1135  BUN 12 12 7 7 6 7 8  <5* 5* 5*  CREATININE 1.00 0.71 0.71 0.73 0.64 0.68 0.83 0.71 0.64 0.75  CO2 20* 23 21* 22 26 28  20* 22 25 23    Cardiac Enzymes: No results for input(s): CKTOTAL, CKMB, CKMBINDEX, TROPONINI in the last 168 hours. BNP (last 3 results) No results for input(s): PROBNP in the last 8760 hours. HbA1C: No results for input(s): HGBA1C in the last 72 hours. CBG: No results for input(s): GLUCAP in the last 168 hours. Lipid Profile: No results for input(s): CHOL, HDL, LDLCALC, TRIG, CHOLHDL, LDLDIRECT in the last 72 hours. Thyroid  Function Tests: No results for input(s): TSH, T4TOTAL, FREET4,  T3FREE, THYROIDAB in the last 72 hours. Anemia Panel: No results for input(s): VITAMINB12, FOLATE, FERRITIN, TIBC, IRON, RETICCTPCT in the last 72 hours. Urine analysis:    Component Value Date/Time   COLORURINE YELLOW 10/28/2023 1928   APPEARANCEUR CLEAR 10/28/2023 1928   LABSPEC 1.009 10/28/2023 1928   PHURINE 7.0 10/28/2023 1928   GLUCOSEU NEGATIVE 10/28/2023 1928   HGBUR NEGATIVE 10/28/2023 1928   BILIRUBINUR NEGATIVE 10/28/2023 1928   BILIRUBINUR 1+ 10/07/2022 0917   KETONESUR NEGATIVE 10/28/2023 1928   PROTEINUR NEGATIVE 10/28/2023 1928   UROBILINOGEN  1.0 10/07/2022 0917   UROBILINOGEN 1.0 12/21/2014 1020   NITRITE NEGATIVE 10/28/2023 1928   LEUKOCYTESUR NEGATIVE 10/28/2023 1928   Radiological Exams on Admission: CT ABDOMEN PELVIS W CONTRAST Result Date: 11/18/2023 CLINICAL DATA:  Acute generalized abdominal pain EXAM: CT ABDOMEN AND PELVIS WITH CONTRAST TECHNIQUE: Multidetector CT imaging of the abdomen and pelvis was performed using the standard protocol following bolus administration of intravenous contrast. RADIATION DOSE REDUCTION: This exam was performed according to the departmental dose-optimization program which includes automated exposure control, adjustment of the mA and/or kV according to patient size and/or use of iterative reconstruction technique. CONTRAST:  75mL OMNIPAQUE  IOHEXOL  350 MG/ML SOLN COMPARISON:  October 29, 2023. FINDINGS: Lower chest: No acute abnormality. Hepatobiliary: Hepatic cirrhosis is noted. Minimal cholelithiasis. No biliary dilatation. Pancreas: Unremarkable. No pancreatic ductal dilatation or surrounding inflammatory changes. Spleen: Moderate splenomegaly is noted. Adrenals/Urinary Tract: Adrenal glands are unremarkable. Kidneys are normal, without renal calculi, focal lesion, or hydronephrosis. Bladder is unremarkable. Stomach/Bowel: Stomach is within normal limits. Appendix appears normal. No evidence of bowel wall thickening,  distention, or inflammatory changes. Vascular/Lymphatic: Tortuosity of varices seen in left upper quadrant consistent with portal hypertension. No enlarged abdominal or pelvic lymph nodes. Reproductive: Status post hysterectomy. 2.2 cm left adnexal cyst is noted. Other: Moderate ascites is noted.  No definite hernia is noted. Musculoskeletal: No acute or significant osseous findings. IMPRESSION: Hepatic cirrhosis with moderate splenomegaly and moderate ascites and left upper quadrant varices suggesting portal hypertension. Minimal cholelithiasis. Electronically Signed   By: Lynwood Landy Raddle M.D.   On: 11/18/2023 17:34   Data Reviewed: Relevant notes from primary care and specialist visits, past discharge summaries as available in EHR, including Care Everywhere . Prior diagnostic testing as pertinent to current admission diagnoses, Updated medications and problem lists for reconciliation .ED course, including vitals, labs, imaging, treatment and response to treatment,Triage notes, nursing and pharmacy notes and ED provider's notes.Notable results as noted in HPI.Discussed case with EDMD/ ED APP/ or Specialty MD on call and as needed.  Assessment & Plan  >> Rectal bleeding: Patient presenting with rectal bleeding with bright red blood and associated dizziness since yesterday.  Differentials in her case include diverticular, AVM, polyp or colonic mass, cancer, portal colopathy, variceal. GI consult requested, IV PPI started, type and screen, n.p.o. after midnight. Will consider albumin  or midodrine .  >> Diarrhea: Will get stool for C. difficile and PCR.  Recurrence of C. difficile is a likelihood in her case but also her bleeding may be giving her the diarrhea and we will evaluate this both.  >> NASH liver cirrhosis: Patient being currently followed by the transplant hepatology clinic at [facility]. Will monitor for liver function test with INR and LFTs.  >> Pancytopenia: Attribute to patient's portal  hypertension.  Type and screen and transfuse as deemed appropriate.   >> Diabetes mellitus type 2: Glycemic protocol.  Patient's diabetes is well-controlled and her last A1c was 6.1.   >> Ascites: IR consult for therapeutic paracentesis per a.m. team as deemed appropriate currently exam is stable.  Abdomen is soft.   DVT prophylaxis:  SCDs Consults:  GI  Advance Care Planning:    Code Status: Full Code   Family Communication:  None Disposition Plan:  Home Severity of Illness: The appropriate patient status for this patient is INPATIENT. Inpatient status is judged to be reasonable and necessary in order to provide the required intensity of service to ensure the patient's safety. The patient's presenting symptoms, physical exam findings, and  initial radiographic and laboratory data in the context of their chronic comorbidities is felt to place them at high risk for further clinical deterioration. Furthermore, it is not anticipated that the patient will be medically stable for discharge from the hospital within 2 midnights of admission.   * I certify that at the point of admission it is my clinical judgment that the patient will require inpatient hospital care spanning beyond 2 midnights from the point of admission due to high intensity of service, high risk for further deterioration and high frequency of surveillance required.*  Unresulted Labs (From admission, onward)     Start     Ordered   11/19/23 0500  CBC with Differential/Platelet  Tomorrow morning,   R        11/18/23 1851   11/19/23 0500  Comprehensive metabolic panel with GFR  Tomorrow morning,   R        11/18/23 1851   11/19/23 0500  Magnesium   Tomorrow morning,   R        11/18/23 1851   11/19/23 0500  Phosphorus  Tomorrow morning,   R        11/18/23 1851   11/18/23 1852  Basic metabolic panel  ONCE - STAT,   STAT        11/18/23 1851   11/18/23 1851  Hemoglobin and hematocrit, blood  Now then every 4 hours,   R  (with TIMED occurrences)      11/18/23 1851   11/18/23 1702  Gastrointestinal Panel by PCR , Stool  (Gastrointestinal Panel by PCR, Stool                                                                                                                                                     **Does Not include CLOSTRIDIUM DIFFICILE testing. **If CDIFF testing is needed, place order from the C Difficile Testing order set.**)  Once,   R        11/18/23 1701   11/18/23 1640  C Difficile Quick Screen w PCR reflex  (C Difficile quick screen w PCR reflex panel )  Once, for 24 hours,   TIMED       References:    CDiff Information Tool   11/18/23 1639            Meds ordered this encounter  Medications   potassium chloride  10 mEq in 100 mL IVPB   FLUoxetine  (PROZAC ) capsule 40 mg   albuterol  (VENTOLIN  HFA) 108 (90 Base) MCG/ACT inhaler 2 puff   montelukast  (SINGULAIR ) tablet 10 mg   lactated ringers  infusion   OR Linked Order Group    acetaminophen  (TYLENOL ) tablet 650 mg    acetaminophen  (TYLENOL ) suppository 650 mg   morphine  (PF) 2 MG/ML injection 2 mg   OR Linked Order Group    ondansetron  (  ZOFRAN ) tablet 4 mg    ondansetron  (ZOFRAN ) injection 4 mg   hydrALAZINE  (APRESOLINE ) injection 5 mg   pantoprazole  (PROTONIX ) injection 40 mg   iohexol  (OMNIPAQUE ) 350 MG/ML injection 75 mL   magnesium  sulfate IVPB 2 g 50 mL   insulin  aspart (novoLOG ) injection 0-6 Units    Correction coverage::   Very Sensitive (ESRD/Dialysis)    CBG < 70::   Implement Hypoglycemia Standing Orders and refer to Hypoglycemia Standing Orders sidebar report    CBG 70 - 120::   0 units    CBG 121 - 150::   0 units    CBG 151 - 200::   1 unit    CBG 201-250::   2 units    CBG 251-300::   3 units    CBG 301-350::   4 units    CBG 351-400::   5 units    CBG > 400:   Give 6 units and call MD     Orders Placed This Encounter  Procedures   C Difficile Quick Screen w PCR reflex   Gastrointestinal Panel by PCR , Stool    CT ABDOMEN PELVIS W CONTRAST   Comprehensive metabolic panel   CBC   Magnesium    Protime-INR   CBC with Differential/Platelet   Comprehensive metabolic panel with GFR   Magnesium    Phosphorus   Hemoglobin and hematocrit, blood   Basic metabolic panel   Diet Carb Modified Fluid consistency: Thin; Room service appropriate? Yes   Initiate Carrier Fluid Protocol   SCDs   Vital signs   Notify physician (specify)   Mobility Protocol: No Restrictions   Refer to Sidebar Report Mobility Protocol for Adult Inpatient   Initiate Adult Central Line Maintenance and Catheter Protocol for patients with central line (CVC, PICC, Port, Hemodialysis, Trialysis)   If patient diabetic or glucose greater than 140 notify physician for Sliding Scale Insulin  Orders   Intake and Output   Initiate CHG Protocol   Do not place and if present remove PureWick   Initiate Oral Care Protocol   Initiate Carrier Fluid Protocol   RN may order General Admission PRN Orders utilizing General Admission PRN medications (through manage orders) for the following patient needs: allergy symptoms (Claritin), cold sores (Carmex), cough (Robitussin DM), eye irritation (Liquifilm Tears), hemorrhoids (Tucks), indigestion (Maalox), minor skin irritation (Hydrocortisone Cream), muscle pain Lucienne Gay), nose irritation (saline nasal spray) and sore throat (Chloraseptic spray).   Cardiac Monitoring - Continuous Indefinite   Apply Diabetes Mellitus Care Plan   STAT CBG when hypoglycemia is suspected. If treated, recheck every 15 minutes after each treatment until CBG >/= 70 mg/dl   Refer to Hypoglycemia Protocol Sidebar Report for treatment of CBG < 70 mg/dl   No HS correction Insulin    Full code   Consult to gastroenterology   Consult to hospitalist   Enteric precautions (UV disinfection)   Pulse oximetry check with vital signs   Oxygen therapy Mode or (Route): Nasal cannula; Liters Per Minute: 2; Keep O2 saturation between: greater  than 92 %   POC occult blood, ED   Type and screen [facility]   Admit to Inpatient (patient's expected length of stay will be greater than 2 midnights or inpatient only procedure)   Aspiration precautions   Fall precautions    Author: Mario LULLA Blanch, MD 12 pm- 8 pm. [facility]. 11/18/2023 6:51 PM Please note for any communication after hours contact [facility] Assigned provider on call on Amion.

## 2023-11-18 NOTE — ED Provider Notes (Signed)
 [facility] AT [facility] Provider Note   CSN: [id number] Arrival date & time: 11/18/23  1121     Patient presents with: Rectal Bleeding   Laurie Sutton is a 43 y.o. female with past medical history of migraine, GERD, insulin -dependent diabetes, Nash, cirrhosis, SBP presents to emergency department for evaluation of rectal bleeding that started at 0200 this morning.  Reports that she noted some blood in her underwear and toilet water that was filled with blood.  Has associated lightheadedness.  Denies thinners  Also has mild generalized abdominal pain that radiates in the back that started yesterday.  Has had abdominal pain on and off for a while.  Has been having paracentesis every month for cirrhosis secondary to NASH.  Last paracentesis was 11/08/2023.  Follows [facility] Victory Brand MD for this. Last colonoscopy and endoscopy on 08/02/23     Rectal Bleeding      Prior to Admission medications   Medication Sig Start Date End Date Taking? Authorizing Provider  acetaminophen  (TYLENOL ) 325 MG tablet Take 2 tablets (650 mg total) by mouth every 6 (six) hours as needed for mild pain (pain score 1-3) or fever (or Fever >/= 101). 10/07/23   Sherrill Cable Latif, DO  albuterol  (VENTOLIN  HFA) 108 [phone] Base) MCG/ACT inhaler INHALE 2 PUFFS INTO THE LUNGS UP TO EVERY 6 HOURS AS NEEDED FOR WHEEZING/SHORTNESS OF BREATH 08/20/22   [name], MD  clotrimazole -betamethasone  (LOTRISONE ) cream APPLY 1 APPLICATION TOPICALLY TWICE A DAY AS NEEDED 05/26/23   [name], MD  Continuous Glucose Sensor (DEXCOM G7 SENSOR) MISC Use 1 sensor for continuous glucose monitoring every 10 days for 30 days 05/25/23   Braulio Hough, MD  FLUoxetine  (PROZAC ) 40 MG capsule TAKE 1 CAPSULE BY MOUTH TWICE A DAY 07/26/23   [name], MD  furosemide  (LASIX ) 80 MG tablet Take 1 tablet (80 mg total) by mouth daily. 10/11/23   Sherrill Cable Latif, DO  HUMULIN  R U-500 KWIKPEN 500 UNIT/ML  KwikPen Use 120 units at breakfast, 100 at lunch, and 100 at dinner Patient taking differently: Use 60 units at breakfast, 65 at lunch, and 65 at dinner 04/08/22   [name], MD  lactulose  (CHRONULAC ) 10 GM/15ML solution Take 15 mLs (10 g total) by mouth 3 (three) times daily. 10/31/23 11/30/23  Sira, Zackery, MD  montelukast  (SINGULAIR ) 10 MG tablet Take 1 tablet (10 mg total) by mouth at bedtime. 08/25/22   [name], MD  omeprazole  (PRILOSEC) 40 MG capsule TAKE 1 CAPSULE (40 MG TOTAL) BY MOUTH IN THE MORNING 10/23/23   Brand Victory LITTIE DOUGLAS, MD  ondansetron  (ZOFRAN ) 4 MG tablet Take 1 tablet (4 mg total) by mouth every 8 (eight) hours as needed for nausea or vomiting. 10/18/23   Craig Alan SAUNDERS, PA-C  ondansetron  (ZOFRAN -ODT) 4 MG disintegrating tablet 1 tablet on the tongue and allow to dissolve by mouth three times a day; Duration: 30 days    [provider]  spironolactone  (ALDACTONE ) 100 MG tablet Take 3 tablets (300 mg total) by mouth daily. 11/01/23 12/01/23  Sira, Zackery, MD  tirzepatide  (MOUNJARO ) 7.5 MG/0.5ML Pen Inject 7.5 mg Subcutaneous once a week 30 days Patient not taking: Reported on 10/29/2023 07/06/23       Allergies: Vancomycin , Amoxicillin, Azithromycin , Dulaglutide , Empagliflozin -metformin  hcl er, Januvia  [sitagliptin ], Latex, Metformin , and Penicillins    Review of Systems  Gastrointestinal:  Positive for hematochezia.    Updated Vital Signs BP (!) 118/56  Pulse 82   Temp 97.9 F (36.6 C) (Oral)   Resp 16   Ht 5' 6 (1.676 m)   Wt 89.4 kg   LMP 11/13/2020 (Exact Date)   SpO2 100%   BMI 31.80 kg/m   Physical Exam Vitals and nursing note reviewed.  Constitutional:      General: She is not in acute distress.    Appearance: Normal appearance.  HENT:     Head: Normocephalic and atraumatic.  Eyes:     Conjunctiva/sclera: Conjunctivae normal.  Cardiovascular:     Rate and Rhythm: Normal rate.  Pulmonary:     Effort: Pulmonary effort is normal. No  respiratory distress.  Genitourinary:    Rectum: Guaiac result positive.     Comments: Nonthrombosed nontender external hemorrhoids. No gross bright red blood nor melena with rectal exam. Skin:    Coloration: Skin is not jaundiced or pale.  Neurological:     Mental Status: She is alert. Mental status is at baseline.   GU exam performed with Laymon Righter CNA  (all labs ordered are listed, but only abnormal results are displayed) Labs Reviewed  CBC - Abnormal; Notable for the following components:      Result Value   WBC 2.6 (*)    RBC 3.40 (*)    Hemoglobin 10.6 (*)    HCT 31.4 (*)    Platelets 61 (*)    All other components within normal limits  COMPREHENSIVE METABOLIC PANEL WITH GFR  POC OCCULT BLOOD, ED  TYPE AND SCREEN    EKG: None  Radiology: No results found.    Medications Ordered in the ED - No data to display                                  Medical Decision Making Amount and/or Complexity of Data Reviewed Labs: ordered. Radiology: ordered.  Risk Prescription drug management. Decision regarding hospitalization.   Patient presents to the ED for concern of rectal bleeding, generalized abd pain, this involves an extensive number of treatment options, and is a complaint that carries with it a high risk of complications and morbidity.  The differential diagnosis includes colitis, gastroenteritis, appendicitis, pancreatitis, GERD, mesenteric ischemia, ascites, GIB, internal hemorrhoids   Co morbidities that complicate the patient evaluation  Hx of hemorrhoids, NASH, ascites   Additional history obtained:  Additional history obtained from Nursing and Outside Medical Records   External records from outside source obtained and reviewed including triage RN note, GI note from 10/18/23   Lab Tests:  I Ordered, and personally interpreted labs.  The pertinent results include:   Fecal occult + Hgb 10.6   Imaging Studies ordered:  I ordered imaging  studies including CT abd pelvis  I independently visualized and interpreted imaging which showed  Hepatic cirrhosis with moderate splenomegaly and moderate ascites and left upper quadrant varices suggesting portal hypertension.  Minimal cholelithiasis I agree with the radiologist interpretation   Cardiac Monitoring:  The patient was maintained on a cardiac monitor.      Medicines ordered and prescription drug management:  I ordered medication including potassium  for hypokalemia  Reevaluation of the patient after these medicines showed that the patient improved I have reviewed the patients home medicines and have made adjustments as needed     Consultations Obtained:  I requested consultation with [facility] [name],  and discussed lab and imaging findings as well as pertinent plan -  they will consult on patient during admission I requested consultation with hospitalist,  and discussed lab and imaging findings as well as pertinent plan -Dr. Tobie accepts patient for admission   Problem List / ED Course:  Rectal bleeding  Chronically anemic.  Hgb baseline 9.6-11.5 over past month.  Today is 10.6 Is complaining of some lightheadedness Fecal occult positive but no gross hemorrhage on rectal exam. May be related to external/internal hemorrhoids Last colonoscopy on 08/02/23 (4mm polyp in transverse colon was removed, internal hemorrhoids, congested colon in entire colon, portal colopathy) Last endoscopy 08/02/23 (Grade II esophageal varices. Portal hypertensive gastropathy. Nodular mucosa in the antrum.) Currently hemodynamically stable with no hypotension or tachycardia.  Do not think that patient needs emergent transfusion Consider admission based on amount of blood reported by patient in toilet  Ascites From decompensated liver cirrhosis 2/2 NASH Hx of SBP.  Nonsurgical abdomen with no peritoneal signs.  Low suspicion for SBP Has paracentesis every month. Last was  11/08/23.  Typically has 4-6 L drained. Is on Lasix  80 mg daily and spironolactone  200 mg daily  Hypokalemia Provided potassium x2 via IV No flat t waves on EKG   Reevaluation:  After the interventions noted above, I reevaluated the patient and found that they have :stayed the same    Dispostion:  After consideration of the diagnostic results and the patients response to treatment, I feel that the patent would benefit from admission for GI consult, trending hgb, trending K+, possible therapeutic paracentesis all at discretion of admitting team.  Discussed ED workup, dispo patient expressed understand agrees with plan  Final diagnoses:  Rectal bleeding  Other ascites    ED Discharge Orders     None        Laurie Tinnie BRAVO, PA 11/19/23 1128    Laurie Prentice SAUNDERS, MD 11/28/23 1441

## 2023-11-19 ENCOUNTER — Inpatient Hospital Stay (HOSPITAL_COMMUNITY)

## 2023-11-19 DIAGNOSIS — K7460 Unspecified cirrhosis of liver: Secondary | ICD-10-CM

## 2023-11-19 DIAGNOSIS — K7581 Nonalcoholic steatohepatitis (NASH): Secondary | ICD-10-CM

## 2023-11-19 DIAGNOSIS — E876 Hypokalemia: Secondary | ICD-10-CM

## 2023-11-19 DIAGNOSIS — K7682 Hepatic encephalopathy: Secondary | ICD-10-CM

## 2023-11-19 DIAGNOSIS — I8500 Esophageal varices without bleeding: Secondary | ICD-10-CM

## 2023-11-19 DIAGNOSIS — K625 Hemorrhage of anus and rectum: Secondary | ICD-10-CM | POA: Diagnosis not present

## 2023-11-19 DIAGNOSIS — R188 Other ascites: Secondary | ICD-10-CM

## 2023-11-19 DIAGNOSIS — K7290 Hepatic failure, unspecified without coma: Secondary | ICD-10-CM

## 2023-11-19 DIAGNOSIS — K766 Portal hypertension: Secondary | ICD-10-CM

## 2023-11-19 LAB — MAGNESIUM: Magnesium: 2 mg/dL (ref 1.7–2.4)

## 2023-11-19 LAB — CBC WITH DIFFERENTIAL/PLATELET
Abs Granulocyte: 1.1 K/uL — ABNORMAL LOW (ref 1.5–6.5)
Abs Immature Granulocytes: 0.01 K/uL (ref 0.00–0.07)
Abs Immature Granulocytes: 0.01 K/uL (ref 0.00–0.07)
Basophils Absolute: 0 K/uL (ref 0.0–0.1)
Basophils Absolute: 0 K/uL (ref 0.0–0.1)
Basophils Relative: 1 %
Basophils Relative: 1 %
Eosinophils Absolute: 0.2 K/uL (ref 0.0–0.5)
Eosinophils Absolute: 0.1 K/uL (ref 0.0–0.5)
Eosinophils Relative: 8 %
Eosinophils Relative: 6 %
HCT: 29.9 % — ABNORMAL LOW (ref 36.0–46.0)
HCT: 26.1 % — ABNORMAL LOW (ref 36.0–46.0)
Hemoglobin: 9.9 g/dL — ABNORMAL LOW (ref 12.0–15.0)
Hemoglobin: 8.8 g/dL — ABNORMAL LOW (ref 12.0–15.0)
Immature Granulocytes: 0 %
Immature Granulocytes: 1 %
Lymphocytes Relative: 22 %
Lymphocytes Relative: 22 %
Lymphs Abs: 0.5 K/uL — ABNORMAL LOW (ref 0.7–4.0)
Lymphs Abs: 0.4 K/uL — ABNORMAL LOW (ref 0.7–4.0)
MCH: 30.8 pg (ref 26.0–34.0)
MCH: 31.2 pg (ref 26.0–34.0)
MCHC: 33.1 g/dL (ref 30.0–36.0)
MCHC: 33.7 g/dL (ref 30.0–36.0)
MCV: 93.1 fL (ref 80.0–100.0)
MCV: 92.6 fL (ref 80.0–100.0)
Monocytes Absolute: 0.3 K/uL (ref 0.1–1.0)
Monocytes Absolute: 0.3 K/uL (ref 0.1–1.0)
Monocytes Relative: 13 %
Monocytes Relative: 15 %
Neutro Abs: 1.4 K/uL — ABNORMAL LOW (ref 1.7–7.7)
Neutro Abs: 1.1 K/uL — ABNORMAL LOW (ref 1.7–7.7)
Neutrophils Relative %: 56 %
Neutrophils Relative %: 55 %
Platelets: 60 K/uL — ABNORMAL LOW (ref 150–400)
Platelets: 55 K/uL — ABNORMAL LOW (ref 150–400)
RBC: 3.21 MIL/uL — ABNORMAL LOW (ref 3.87–5.11)
RBC: 2.82 MIL/uL — ABNORMAL LOW (ref 3.87–5.11)
RDW: 14.8 % (ref 11.5–15.5)
RDW: 14.7 % (ref 11.5–15.5)
WBC: 2.4 K/uL — ABNORMAL LOW (ref 4.0–10.5)
WBC: 2 K/uL — ABNORMAL LOW (ref 4.0–10.5)
nRBC: 0.8 % — ABNORMAL HIGH (ref 0.0–0.2)
nRBC: 0 % (ref 0.0–0.2)

## 2023-11-19 LAB — BODY FLUID CELL COUNT WITH DIFFERENTIAL
Eos, Fluid: 0 %
Lymphs, Fluid: 67 %
Monocyte-Macrophage-Serous Fluid: 28 % — ABNORMAL LOW (ref 50–90)
Neutrophil Count, Fluid: 5 % (ref 0–25)
Total Nucleated Cell Count, Fluid: 112 uL (ref 0–1000)

## 2023-11-19 LAB — GASTROINTESTINAL PANEL BY PCR, STOOL (REPLACES STOOL CULTURE)

## 2023-11-19 LAB — PHOSPHORUS: Phosphorus: 2.9 mg/dL (ref 2.5–4.6)

## 2023-11-19 LAB — LACTATE DEHYDROGENASE, PLEURAL OR PERITONEAL FLUID: LD, Fluid: <25 U/L — ABNORMAL HIGH (ref 3–23)

## 2023-11-19 LAB — COMPREHENSIVE METABOLIC PANEL WITH GFR
ALT: 23 U/L (ref 0–44)
AST: 34 U/L (ref 15–41)
Albumin: 2.5 g/dL — ABNORMAL LOW (ref 3.5–5.0)
Alkaline Phosphatase: 82 U/L (ref 38–126)
Anion gap: 9 (ref 5–15)
BUN: <5 mg/dL — ABNORMAL LOW (ref 6–20)
CO2: 24 mmol/L (ref 22–32)
Calcium: 8.1 mg/dL — ABNORMAL LOW (ref 8.9–10.3)
Chloride: 100 mmol/L (ref 98–111)
Creatinine, Ser: 0.73 mg/dL (ref 0.44–1.00)
GFR, Estimated: >60 mL/min (ref >60)
Glucose, Bld: 196 mg/dL — ABNORMAL HIGH (ref 70–99)
Potassium: 3.1 mmol/L — ABNORMAL LOW (ref 3.5–5.1)
Sodium: 133 mmol/L — ABNORMAL LOW (ref 135–145)
Total Bilirubin: 3.4 mg/dL — ABNORMAL HIGH (ref 0.0–1.2)
Total Protein: 6 g/dL — ABNORMAL LOW (ref 6.5–8.1)

## 2023-11-19 LAB — C DIFFICILE QUICK SCREEN W PCR REFLEX
C Diff antigen: NEGATIVE
C Diff interpretation: NOT DETECTED
C Diff toxin: NEGATIVE

## 2023-11-19 LAB — GRAM STAIN

## 2023-11-19 LAB — ALBUMIN, PLEURAL OR PERITONEAL FLUID: Albumin, Fluid: <1.5 g/dL

## 2023-11-19 LAB — GLUCOSE, CAPILLARY
Glucose-Capillary: 176 mg/dL — ABNORMAL HIGH (ref 70–99)
Glucose-Capillary: 195 mg/dL — ABNORMAL HIGH (ref 70–99)
Glucose-Capillary: 187 mg/dL — ABNORMAL HIGH (ref 70–99)
Glucose-Capillary: 203 mg/dL — ABNORMAL HIGH (ref 70–99)

## 2023-11-19 LAB — GLUCOSE, PLEURAL OR PERITONEAL FLUID: Glucose, Fluid: 212 mg/dL

## 2023-11-19 LAB — PROTEIN, PLEURAL OR PERITONEAL FLUID: Total protein, fluid: <3 g/dL

## 2023-11-19 LAB — MRSA NEXT GEN BY PCR, NASAL: MRSA by PCR Next Gen: NOT DETECTED

## 2023-11-19 MED ORDER — POTASSIUM CHLORIDE CRYS ER 20 MEQ PO TBCR
40.0000 meq | EXTENDED_RELEASE_TABLET | Freq: Once | ORAL | Status: AC
  Administered 2023-11-19: 40 meq via ORAL
  Filled 2023-11-19: qty 2

## 2023-11-19 MED ORDER — VITAMIN K1 10 MG/ML IJ SOLN
5.0000 mg | Freq: Once | INTRAVENOUS | Status: AC
  Administered 2023-11-19: 5 mg via INTRAVENOUS
  Filled 2023-11-19: qty 0.5

## 2023-11-19 MED ORDER — LIDOCAINE HCL (PF) 1 % IJ SOLN
10.0000 mL | Freq: Once | INTRAMUSCULAR | Status: AC
  Administered 2023-11-19: 10 mL

## 2023-11-19 MED ORDER — LACTULOSE 10 GM/15ML PO SOLN
20.0000 g | Freq: Three times a day (TID) | ORAL | Status: DC
  Administered 2023-11-19 – 2023-11-22 (×12): 20 g via ORAL
  Filled 2023-11-19 (×9): qty 30

## 2023-11-19 MED ORDER — ALBUMIN HUMAN 25 % IV SOLN
50.0000 g | Freq: Four times a day (QID) | INTRAVENOUS | Status: DC
  Administered 2023-11-19: 50 g via INTRAVENOUS
  Filled 2023-11-19: qty 200

## 2023-11-19 MED ORDER — POTASSIUM CHLORIDE 10 MEQ/100ML IV SOLN
10.0000 meq | INTRAVENOUS | Status: AC
  Administered 2023-11-19 (×3): 10 meq via INTRAVENOUS
  Filled 2023-11-19 (×3): qty 100

## 2023-11-19 MED ORDER — MIDODRINE HCL 5 MG PO TABS
10.0000 mg | ORAL_TABLET | Freq: Three times a day (TID) | ORAL | Status: DC
  Administered 2023-11-19 – 2023-11-22 (×13): 10 mg via ORAL
  Filled 2023-11-19 (×9): qty 2

## 2023-11-19 MED ORDER — LACTATED RINGERS IV BOLUS
250.0000 mL | Freq: Once | INTRAVENOUS | Status: AC | PRN
  Administered 2023-11-19: 250 mL via INTRAVENOUS

## 2023-11-19 NOTE — Plan of Care (Signed)
  Problem: Education: Goal: Knowledge of General Education information will improve Description: Including pain rating scale, medication(s)/side effects and non-pharmacologic comfort measures Outcome: Progressing   Problem: Health Behavior/Discharge Planning: Goal: Ability to manage health-related needs will improve Outcome: Progressing   Problem: Clinical Measurements: Goal: Ability to maintain clinical measurements within normal limits will improve Outcome: Progressing   Problem: Elimination: Goal: Will not experience complications related to bowel motility Outcome: Progressing   Problem: Pain Managment: Goal: General experience of comfort will improve and/or be controlled Outcome: Progressing   Problem: Skin Integrity: Goal: Risk for impaired skin integrity will decrease Outcome: Progressing   Problem: Tissue Perfusion: Goal: Adequacy of tissue perfusion will improve Outcome: Progressing

## 2023-11-19 NOTE — Progress Notes (Signed)
 Patient started c/o of sharp electric shock going down her body 20 mins after starting Vit K infusion. Medication was stopped and line flushed.  Patient reported that the shocking sensation had decreased and then stopped. Dr Dennise informed. Ordered to stop the Vit K.  Will add Vit K to allergy list

## 2023-11-19 NOTE — Plan of Care (Signed)

## 2023-11-19 NOTE — Plan of Care (Signed)

## 2023-11-19 NOTE — Consult Note (Signed)
 Consultation Note   Referring Provider:  [facility] PCP: [name], MD Primary Gastroenterologist:  Victory Brand, MD / [facility]  Reason for Consultation: Lower GI bleeding DOA: 11/18/2023         Hospital Day: 2   ASSESSMENT    Patient Profile:  43 y.o. year old female with a medical history including but not limited to decompensated Nash cirrhosis with ascites and hepatic cephalopathy, diabetes, peripheral neuropathy, ADHD. Admitted with painless lower GI bleeding. Suspect rectal bleeding ( Hemorrhoids or maybe has developed rectal varices)  Decompensated MASH cirrhosis with esophageal varices, ascites, hepatic encephalopathy.  MELD 3.0: 22 at 11/19/2023  6:02 AM S/p LVP with removal of 6 L today - cell count negative for SBP Renal function normal.   Hypokalemia.  Presenting potassium 2.7.  Repletion in progress   Painless lower GI bleeding. Suspect rectal source (Hemorrhoids or maybe has developed rectal varices)   Principal Problem:   GIB (gastrointestinal bleeding) Active Problems:   Decompensated cirrhosis (HCC)      PLAN:   --Flexible sigmoidoscopy in am.. The risks and benefits of flexible sigmoidoscopy were discussed and the patient agrees to proceed.  -- Resuming her home lactulose  -- 2 g sodium restricted diet, n.p.o. after midnight.   -- Would resume home diuretics tomorrow with attention to electrolytes and renal function.  -- Patient has an upcoming follow-up with [facility]at[facility] in [place]   HPI   Brief history  Laurie Sutton is followed by us  and [facility] for decompensated MASH cirrhosis. She was admitted in late June with fevers / chills.  She had an LVP with removal of 3 L -no SBP . CT scan showed diffuse wall thickening of the ascending, transverse and descending colon worrisome for colitis She tested positive for both Campylobacter and C-diff. Treated with  Dificid  with resolution of symptoms.  A paracentesis that admission was negative for SBP .  Arcenia was readmitted mid July with recurrent abdominal pain and crampy diarrhea as well as fever.  She had 2 LVP's with removal of 4.4 L and later 2.7 L  -no SBP . Repeat CT scan with contrast showed large amount of ascites , markedly severe colitis involving the ascending colon . Started on Rocephin  and Flagyl .  Repeat C. difficile and GI pathogen panel were negative, antibiotics discontinued.  Symptoms improved, discharged home on 10/31/2023.   Interval history [name] presented to the ED yesterday for evaluation of rectal bleeding.  She woke up in the early morning hours of yesterday and had a bloody bowel movement.  Several hours later she had another episode.  No bowel movement since but she has continued to pass small amounts of r red blood when she urinates.  Her hemoglobin is declining..  She has never had a problem to rectal bleeding before.  She is not having diarrhea, bowel movements are at baseline with lactulose .  She has been having some generalized abdominal discomfort when she lays on her sides..  She had 6 L ascitic fluid removed short while ago and does notice some improvement in this discomfort.  No fevers or chills.  [name] has been requiring more frequent LVP's since her bout with C. difficile in June.  She tells me that she follows up 2 g sodium diet.  She is compliant with her diuretics.     Labs and Imaging:  Recent Labs    11/18/23 1135 11/19/23 0602  PROT 6.6 6.0*  ALBUMIN  2.7* 2.5*  AST 39 34  ALT 25 23  ALKPHOS 94 82  BILITOT 3.8* 3.4*   Recent Labs    11/18/23 1135 11/18/23 1941 11/18/23 2224 11/19/23 0602  WBC 2.6*  --   --  2.4*  HGB 10.6* 10.3* 9.6* 9.9*  HCT 31.4* 30.6* 28.6* 29.9*  MCV 92.4  --   --  93.1  PLT 61*  --   --  60*   Recent Labs    11/18/23 1135 11/18/23 1941 11/19/23 0602  NA 132* 132* 133*  K 2.7* 2.9* 3.1*  CL 96* 96* 100  CO2 23 27  24   GLUCOSE 266* 209* 196*  BUN 5* <5* <5*  CREATININE 0.75 0.69 0.73  CALCIUM 8.2* 8.2* 8.1*     CT ABDOMEN PELVIS W CONTRAST CLINICAL DATA:  Acute generalized abdominal pain  EXAM: CT ABDOMEN AND PELVIS WITH CONTRAST  TECHNIQUE: Multidetector CT imaging of the abdomen and pelvis was performed using the standard protocol following bolus administration of intravenous contrast.  RADIATION DOSE REDUCTION: This exam was performed according to the departmental dose-optimization program which includes automated exposure control, adjustment of the mA and/or kV according to patient size and/or use of iterative reconstruction technique.  CONTRAST:  75mL OMNIPAQUE  IOHEXOL  350 MG/ML SOLN  COMPARISON:  October 29, 2023.  FINDINGS: Lower chest: No acute abnormality.  Hepatobiliary: Hepatic cirrhosis is noted. Minimal cholelithiasis. No biliary dilatation.  Pancreas: Unremarkable. No pancreatic ductal dilatation or surrounding inflammatory changes.  Spleen: Moderate splenomegaly is noted.  Adrenals/Urinary Tract: Adrenal glands are unremarkable. Kidneys are normal, without renal calculi, focal lesion, or hydronephrosis. Bladder is unremarkable.  Stomach/Bowel: Stomach is within normal limits. Appendix appears normal. No evidence of bowel wall thickening, distention, or inflammatory changes.  Vascular/Lymphatic: Tortuosity of varices seen in left upper quadrant consistent with portal hypertension. No enlarged abdominal or pelvic lymph nodes.  Reproductive: Status post hysterectomy. 2.2 cm left adnexal cyst is noted.  Other: Moderate ascites is noted.  No definite hernia is noted.  Musculoskeletal: No acute or significant osseous findings.  IMPRESSION: Hepatic cirrhosis with moderate splenomegaly and moderate ascites and left upper quadrant varices suggesting portal hypertension.  Minimal cholelithiasis.  Electronically Signed   By: Lynwood Landy Raddle M.D.   On:  11/18/2023 17:34    Pertinent GI Studies    EGD 08/02/2023 for follow-up on esophageal varices --Grade 2 esophageal varices --Portal hypertensive gastropathy --Nodular mucosa in the antrum --Normal examined duodenum --No specimens collected  Colonoscopy 08/02/2023 for rectal bleeding -Internal hemorrhoids  -Congested mucosa in the entire colon (portal colopathy) -One 4 mm polyp in the transverse colon was removed -Redundant colon   Past Medical History:  Diagnosis Date   ADHD    Allergy    Anemia    IRON TRANSFUSION 07-2020 NONE SINCE   Anxiety    Back pain    COVID 08/12/2019   ALL SYMPTOMS REOLVED PER PT   Depression    Diabetic neuropathy (HCC) 11/11/2020   FEET   dm type 2    sees Dr. Odella Jacobson at [facility]   Fatty liver    GERD (gastroesophageal reflux disease)    History of kidney stones    Joint pain    Menorrhagia 11/11/2020  Migraines    Murmur, cardiac    FAINT NO CARDIOLOGIST   Other fatigue    Pneumonia 08/12/2019   COVID PNEUMONIA ALL SYMPTOMS RESOLVED PER PT   Shortness of breath on exertion     Past Surgical History:  Procedure Laterality Date   CESAREAN SECTION  12/13/2006   COLONOSCOPY WITH PROPOFOL  N/A 08/02/2023   Procedure: COLONOSCOPY WITH PROPOFOL ;  Surgeon: Legrand Victory LITTIE DOUGLAS, MD;  Location: [facility];  Service: Gastroenterology;  Laterality: N/A;   ESOPHAGOGASTRODUODENOSCOPY (EGD) WITH PROPOFOL  N/A 08/02/2023   Procedure: ESOPHAGOGASTRODUODENOSCOPY (EGD) WITH PROPOFOL ;  Surgeon: Legrand Victory LITTIE DOUGLAS, MD;  Location: [facility];  Service: Gastroenterology;  Laterality: N/A;   EXTRACORPOREAL SHOCK WAVE LITHOTRIPSY  2018   FOOT SURGERY Right 10/02/2020   RIGHT FOOT HEEL AND TOES, [facility] OFF [address]   IR PARACENTESIS  07/06/2023   IR PARACENTESIS  08/18/2023   IR PARACENTESIS  10/05/2023   IR PARACENTESIS  10/11/2023   IR PARACENTESIS  10/20/2023   IR PARACENTESIS  11/08/2023   IR TRANSCATHETER BX  07/26/2023   IR US   GUIDE VASC ACCESS RIGHT  07/26/2023   IR VENOGRAM HEPATIC W HEMODYNAMIC EVALUATION  07/26/2023   LAPAROSCOPIC VAGINAL HYSTERECTOMY WITH SALPINGECTOMY Bilateral 11/17/2020   Procedure: LAPAROSCOPIC ASSISTED VAGINAL HYSTERECTOMY WITH BILATERAL SALPINGECTOMY;  Surgeon: Dannielle Bouchard, DO;  Location: [facility];  Service: Gynecology;  Laterality: Bilateral;   POLYPECTOMY  08/02/2023   Procedure: POLYPECTOMY, INTESTINE;  Surgeon: Legrand Victory LITTIE DOUGLAS, MD;  Location: [facility];  Service: Gastroenterology;;    Family History  Problem Relation Age of Onset   Colon polyps Mother    Depression Mother    Anxiety disorder Mother    Colon polyps Father    Sleep apnea Father    Anxiety disorder Father    Depression Father    Diabetes Father    Bladder Cancer Father 76   Rheum arthritis Father    Migraines Maternal Aunt    Migraines Maternal Grandmother    Diabetes Maternal Grandfather    Healthy Daughter    Asthma Daughter    Anxiety disorder Daughter    Anxiety disorder Son    Asthma Son    Healthy Son    Cerebral aneurysm Cousin    Heart disease Other    Depression Other    Anxiety disorder Other    Sleep apnea Other    Colon cancer Neg Hx    Esophageal cancer Neg Hx    Stomach cancer Neg Hx    Rectal cancer Neg Hx     Prior to Admission medications   Medication Sig Start Date End Date Taking? Authorizing Provider  acetaminophen  (TYLENOL ) 325 MG tablet Take 2 tablets (650 mg total) by mouth every 6 (six) hours as needed for mild pain (pain score 1-3) or fever (or Fever >/= 101). 10/07/23   Sherrill Cable Latif, DO  albuterol  (VENTOLIN  HFA) 108 [id number] Base) MCG/ACT inhaler INHALE 2 PUFFS INTO THE LUNGS UP TO EVERY 6 HOURS AS NEEDED FOR WHEEZING/SHORTNESS OF BREATH 08/20/22   [name], MD  clotrimazole -betamethasone  (LOTRISONE ) cream APPLY 1 APPLICATION TOPICALLY TWICE A DAY AS NEEDED 05/26/23   [name], MD  Continuous Glucose Sensor (DEXCOM G7 SENSOR) MISC Use 1  sensor for continuous glucose monitoring every 10 days for 30 days 05/25/23   Braulio Hough, MD  FLUoxetine  (PROZAC ) 40 MG capsule TAKE 1 CAPSULE BY MOUTH TWICE A DAY 07/26/23   [name], MD  furosemide  (LASIX ) 80 MG tablet Take 1 tablet (80 mg total) by mouth daily. 10/11/23   Sherrill Cable Latif, DO  HUMULIN  R U-500 KWIKPEN 500 UNIT/ML KwikPen Use 120 units at breakfast, 100 at lunch, and 100 at dinner Patient taking differently: Use 60 units at breakfast, 65 at lunch, and 65 at dinner 04/08/22   [name], MD  lactulose  (CHRONULAC ) 10 GM/15ML solution Take 15 mLs (10 g total) by mouth 3 (three) times daily. 10/31/23 11/30/23  Sira, Zackery, MD  montelukast  (SINGULAIR ) 10 MG tablet Take 1 tablet (10 mg total) by mouth at bedtime. 08/25/22   [name], MD  omeprazole  (PRILOSEC) 40 MG capsule TAKE 1 CAPSULE (40 MG TOTAL) BY MOUTH IN THE MORNING 10/23/23   Legrand Victory LITTIE DOUGLAS, MD  ondansetron  (ZOFRAN ) 4 MG tablet Take 1 tablet (4 mg total) by mouth every 8 (eight) hours as needed for nausea or vomiting. 10/18/23   Craig Alan SAUNDERS, PA-C  ondansetron  (ZOFRAN -ODT) 4 MG disintegrating tablet 1 tablet on the tongue and allow to dissolve by mouth three times a day; Duration: 30 days    [provider]  spironolactone  (ALDACTONE ) 100 MG tablet Take 3 tablets (300 mg total) by mouth daily. 11/01/23 12/01/23  Sira, Zackery, MD  spironolactone  (ALDACTONE ) 50 MG tablet Take 50 mg by mouth 2 (two) times daily. 11/17/23   [provider]  tirzepatide  (MOUNJARO ) 7.5 MG/0.5ML Pen Inject 7.5 mg Subcutaneous once a week 30 days Patient not taking: Reported on 10/29/2023 07/06/23     XIFAXAN 550 MG TABS tablet Take 550 mg by mouth 2 (two) times daily. 10/27/23   [provider]    Current Facility-Administered Medications  Medication Dose Route Frequency Provider Last Rate Last Admin   acetaminophen  (TYLENOL ) tablet 650 mg  650 mg Oral Q6H PRN Patel, Ekta V, MD       Or   acetaminophen   (TYLENOL ) suppository 650 mg  650 mg Rectal Q6H PRN Tobie Mario GAILS, MD       albumin  human 25 % solution 50 g  50 g Intravenous Q6H Singh, Prashant K, MD       albuterol  (VENTOLIN  HFA) 108 (90 Base) MCG/ACT inhaler 2 puff  2 puff Inhalation Q6H PRN Tobie Mario GAILS, MD       FLUoxetine  (PROZAC ) capsule 40 mg  40 mg Oral BID Patel, Ekta V, MD   40 mg at 11/19/23 9085   hydrALAZINE  (APRESOLINE ) injection 5 mg  5 mg Intravenous Q4H PRN Patel, Ekta V, MD       insulin  aspart (novoLOG ) injection 0-6 Units  0-6 Units Subcutaneous TID AC & HS Patsy Lenis, MD   2 Units at 11/18/23 2125   lactated ringers  bolus 250 mL  250 mL Intravenous Once PRN Singh, Prashant K, MD       midodrine  (PROAMATINE ) tablet 10 mg  10 mg Oral TID WC Singh, Prashant K, MD   10 mg at 11/19/23 0914   montelukast  (SINGULAIR ) tablet 10 mg  10 mg Oral QHS Patel, Ekta V, MD   10 mg at 11/18/23 2106   morphine  (PF) 2 MG/ML injection 2 mg  2 mg Intravenous Q4H PRN Patel, Ekta V, MD       ondansetron  (ZOFRAN ) tablet 4 mg  4 mg Oral Q6H PRN Tobie Mario GAILS, MD       Or   ondansetron  (ZOFRAN ) injection 4 mg  4 mg Intravenous Q6H PRN Tobie Mario GAILS, MD  pantoprazole  (PROTONIX ) injection 40 mg  40 mg Intravenous Q12H Tobie Modest V, MD   40 mg at 11/19/23 0915   phytonadione  (VITAMIN K ) 5 mg in dextrose  5 % 50 mL IVPB  5 mg Intravenous Once Dennise Lavada POUR, MD 50.5 mL/hr at 11/19/23 0914 5 mg at 11/19/23 0914   potassium chloride  SA (KLOR-CON  M) CR tablet 40 mEq  40 mEq Oral Once Dennise Lavada POUR, MD        Allergies as of 11/18/2023 - Review Complete 11/18/2023  Allergen Reaction Noted   Vancomycin  Itching 10/04/2023   Amoxicillin Hives and Itching 02/16/2011   Azithromycin   07/25/2017   Dulaglutide  Other (See Comments) 08/21/2020   Empagliflozin -metformin  hcl er Other (See Comments) and Swelling 08/21/2020   Januvia  [sitagliptin ] Other (See Comments) 11/18/2014   Latex  07/25/2017   Metformin   12/16/2022   Penicillins Hives  02/16/2011    Social History   Socioeconomic History   Marital status: Married    Spouse name: Adam   Number of children: 2   Years of education: Not on file   Highest education level: 12th grade  Occupational History   Occupation: Arts administrator and Nutrition    Employer: [employer]    Comment: works 20 hours per week  Tobacco Use   Smoking status: Never    Passive exposure: Current   Smokeless tobacco: Never  Vaping Use   Vaping status: Never Used  Substance and Sexual Activity   Alcohol use: No    Alcohol/week: 0.0 standard drinks of alcohol   Drug use: No   Sexual activity: Yes    Partners: Male    Birth control/protection: None  Other Topics Concern   Not on file  Social History Narrative   Not on file   Social Drivers of Health   Financial Resource Strain: Low Risk  (09/19/2022)   Overall Financial Resource Strain (CARDIA)    Difficulty of Paying Living Expenses: Not hard at all  Food Insecurity: No Food Insecurity (11/18/2023)   Hunger Vital Sign    Worried About Running Out of Food in the Last Year: Never true    Ran Out of Food in the Last Year: Never true  Transportation Needs: No Transportation Needs (11/18/2023)   PRAPARE - Administrator, Civil Service (Medical): No    Lack of Transportation (Non-Medical): No  Physical Activity: Insufficiently Active (06/04/2021)   Exercise Vital Sign    Days of Exercise per Week: 2 days    Minutes of Exercise per Session: 10 min  Stress: Stress Concern Present (09/19/2022)   [employer] of Occupational Health - Occupational Stress Questionnaire    Feeling of Stress : To some extent  Social Connections: Socially Integrated (10/05/2023)   Social Connection and Isolation Panel    Frequency of Communication with Friends and Family: More than three times a week    Frequency of Social Gatherings with Friends and Family: Twice a week    Attends Religious Services: More than 4 times per year    Active Member  of [employer] or Organizations: No    Attends Engineer, structural: More than 4 times per year    Marital Status: Married  Catering manager Violence: Not At Risk (11/18/2023)   Humiliation, Afraid, Rape, and Kick questionnaire    Fear of Current or Ex-Partner: No    Emotionally Abused: No    Physically Abused: No    Sexually Abused: No     Code Status  Code Status: Full Code  Review of Systems: All systems reviewed and negative except where noted in HPI.  Physical Exam: Vital signs in last 24 hours: Temp:  [97.7 F (36.5 C)-98.6 F (37 C)] 97.9 F (36.6 C) (08/09 0811) Pulse Rate:  [72-85] 72 (08/09 0811) Resp:  [16-22] 20 (08/09 0811) BP: (98-122)/(51-60) 98/55 (08/09 0811) SpO2:  [93 %-100 %] 100 % (08/09 0811) Weight:  [89.4 kg] 89.4 kg (08/08 1131) Last BM Date : 11/18/23  General:  Pleasant female in NAD Psych:  Cooperative. Normal mood and affect Eyes: Pupils equal Ears:  Normal auditory acuity Nose: No deformity, discharge or lesions Neck:  Supple, no masses felt Lungs:  Clear to auscultation.  Heart:  Regular rate, regular rhythm.  Abdomen:  Soft, mildly distended,  nontender, active bowel sounds, no masses felt Rectal : She had no pain on DRE.  No masses felt.  Very small amount of watery blood-tinged fluid in vault .  Msk: Symmetrical without gross deformities.  Neurologic:  Alert, oriented, grossly normal neurologically.  A little sleepy but she has no asterixis on exam.  Extremities : No edema Skin:  Intact without significant lesions.    Intake/Output from previous day: 08/08 0701 - 08/09 0700 In: 33.9 [IV Piggyback:33.9] Out: -  Intake/Output this shift:  No intake/output data recorded.   [name], NP-C   11/19/2023, 9:58 AM

## 2023-11-19 NOTE — Procedures (Signed)
 PROCEDURE SUMMARY:  Successful image-guided paracentesis from the left abdomen.  Yielded 6 liters of clear, straw-colored peritoneal fluid.  No immediate complications.  EBL: zero Patient tolerated well.   Specimen was sent for labs.  Please see imaging section of Epic for full dictation.  Carlin DELENA Griffon PA-C 11/19/2023 12:32 PM

## 2023-11-19 NOTE — Progress Notes (Signed)
 PROGRESS NOTE                                                                                                                                                                                                             Patient Demographics:    Laurie Sutton, is a 43 y.o. female, DOB - 11/24/1980, FMW:[id number]  Outpatient Primary MD for the patient is [name], MD    LOS - 1  Admit date - 11/18/2023    Chief Complaint  Patient presents with   Rectal Bleeding       Brief Narrative (HPI from H&P)   43 y.o. female with past medical history  of allergy to penicillin causing hives and itching and angioedema with a rash facial tongue throat swelling shortness of breath and lightheadedness with dizziness and hypotension, also allergic to vancomycin  amoxicillin azithromycin  dulaglutide , Januvia , metformin , Jardiance , latex, migraine, GERD, depression with anxiety, type 2 diabetes, decompensated liver cirrhosis secondary to NASH , splenic varices esophageal varices portal colopathy  esophageal varices portal hypertensive gastropathy, frequent UTI, hypertension, C. difficile colitis in June 2005 with a negative retest for C. difficile in July 2005.    Patient is being admitted today for rectal bleeding that started yesterday patient states that she does feel dizzy and has some weakness but no other than the abdominal distention and discomfort in the rectal bleeding she feels okay.  Patient is following up with hepatology clinic with [facility] where she is also being worked up for possible liver transplant.:    Subjective:    Laurie Sutton today has, No headache, No chest pain, No abdominal pain - No Nausea, No new weakness tingling or numbness, no shortness of breath, still has some blood in her stool intermittently.   Assessment  & Plan :   Bright red blood per rectum.  With some dizziness, fortunately H&H is stable, type  screen done, on IV PPI, likely lower GI bleed question if hemorrhoidal, GI on board.  Continue to monitor CBC, patient agreeable for transfusion if needed.    NASH with cirrhosis, recurrent ascites and pancytopenia.  Supportive care, ultrasound-guided to bed therapeutic and diagnostic paracentesis requested on 11/19/2023, will give albumin , midodrine  and IV fluid bolus with it.  Monitor with supportive care, INR is supratherapeutic trial of vitamin K  on 11/19/2023 as well.  Hypokalemia.  Replaced.  History of C. difficile.  Currently constipated, clinically resolved.  DM type II.  On diet control.      Condition - Extremely Guarded  Family Communication  : None present  Code Status : Full code  Consults  : [facility], IR  PUD Prophylaxis : PPI   Procedures  :     US  guided paracentesis requested 11/19/2023.  CT Hepatic cirrhosis with moderate splenomegaly and moderate ascites and left upper quadrant varices suggesting portal hypertension. Minimal cholelithiasis      Disposition Plan  :    Status is: Inpatient    DVT Prophylaxis  :    Place TED hose Start: 11/19/23 0826 SCDs Start: 11/18/23 1601    Lab Results  Component Value Date   PLT 60 (L) 11/19/2023    Diet :  Diet Order             Diet Carb Modified Fluid consistency: Thin; Room service appropriate? Yes  Diet effective now                    Inpatient Medications  Scheduled Meds:  FLUoxetine   40 mg Oral BID   insulin  aspart  0-6 Units Subcutaneous TID AC & HS   midodrine   10 mg Oral TID WC   montelukast   10 mg Oral QHS   pantoprazole  (PROTONIX ) IV  40 mg Intravenous Q12H   potassium chloride   40 mEq Oral Once   Continuous Infusions:  albumin  human     lactated ringers      phytonadione  (VITAMIN K ) 5 mg in dextrose  5 % 50 mL IVPB     PRN Meds:.acetaminophen  **OR** acetaminophen , albuterol , hydrALAZINE , lactated ringers , morphine  injection, ondansetron  **OR** ondansetron  (ZOFRAN )  IV  Antibiotics  :    Anti-infectives (From admission, onward)    None         Objective:   Vitals:   11/18/23 2020 11/19/23 0000 11/19/23 0434 11/19/23 0811  BP: (!) 104/57 110/60 101/60 (!) 98/55  Pulse: 82 82 83 72  Resp: 20 18 20 20   Temp: 98 F (36.7 C) 97.7 F (36.5 C) 98.5 F (36.9 C) 97.9 F (36.6 C)  TempSrc: Oral Oral Axillary Oral  SpO2: 93% 94%  100%  Weight:      Height:        Wt Readings from Last 3 Encounters:  11/18/23 89.4 kg  10/28/23 89.4 kg  10/18/23 89.4 kg     Intake/Output Summary (Last 24 hours) at 11/19/2023 0827 Last data filed at 11/19/2023 0500 Gross per 24 hour  Intake 33.86 ml  Output --  Net 33.86 ml     Physical Exam  Awake Alert, No new F.N deficits, Normal affect [place].AT,PERRAL Supple Neck, No JVD,   Symmetrical Chest wall movement, Good air movement bilaterally, CTAB RRR,No Gallops,Rubs or new Murmurs,  +ve B.Sounds, Abd distended but nontender, has ascites moderate to severe,  No Cyanosis, Clubbing or edema     RN pressure injury documentation:      Data Review:    Recent Labs  Lab 11/18/23 1135 11/18/23 1941 11/18/23 2224 11/19/23 0602  WBC 2.6*  --   --  2.4*  HGB 10.6* 10.3* 9.6* 9.9*  HCT 31.4* 30.6* 28.6* 29.9*  PLT 61*  --   --  60*  MCV 92.4  --   --  93.1  MCH 31.2  --   --  30.8  MCHC 33.8  --   --  33.1  RDW 14.8  --   --  14.8  LYMPHSABS  --   --   --  0.5*  MONOABS  --   --   --  0.3  EOSABS  --   --   --  0.2  BASOSABS  --   --   --  0.0    Recent Labs  Lab 11/18/23 1135 11/18/23 1528 11/18/23 1616 11/18/23 1941 11/19/23 0602  NA 132*  --   --  132* 133*  K 2.7*  --   --  2.9* 3.1*  CL 96*  --   --  96* 100  CO2 23  --   --  27 24  ANIONGAP 13  --   --  9 9  GLUCOSE 266*  --   --  209* 196*  BUN 5*  --   --  <5* <5*  CREATININE 0.75  --   --  0.69 0.73  AST 39  --   --   --  34  ALT 25  --   --   --  23  ALKPHOS 94  --   --   --  82  BILITOT 3.8*  --   --   --  3.4*   ALBUMIN  2.7*  --   --   --  2.5*  INR  --   --  1.7*  --   --   MG  --  1.6*  --   --  2.0  PHOS  --   --   --   --  2.9  CALCIUM 8.2*  --   --  8.2* 8.1*      Recent Labs  Lab 11/18/23 1135 11/18/23 1528 11/18/23 1616 11/18/23 1941 11/19/23 0602  INR  --   --  1.7*  --   --   MG  --  1.6*  --   --  2.0  CALCIUM 8.2*  --   --  8.2* 8.1*    --------------------------------------------------------------------------------------------------------------- Lab Results  Component Value Date   CHOL 169 08/19/2017   HDL 38 08/19/2017   LDLCALC 91 08/19/2017   TRIG 202 (A) 08/19/2017   CHOLHDL 4 05/12/2015    Lab Results  Component Value Date   HGBA1C 6.1 (H) 10/05/2023   No results for input(s): TSH, T4TOTAL, FREET4, T3FREE, THYROIDAB in the last 72 hours. No results for input(s): VITAMINB12, FOLATE, FERRITIN, TIBC, IRON, RETICCTPCT in the last 72 hours. ------------------------------------------------------------------------------------------------------------------ Cardiac Enzymes No results for input(s): CKMB, TROPONINI, MYOGLOBIN in the last 168 hours.  Invalid input(s): CK  Micro Results Recent Results (from the past 240 hours)  MRSA Next Gen by PCR, Nasal     Status: None   Collection Time: 11/18/23 11:49 PM   Specimen: Nasal Mucosa; Nasal Swab  Result Value Ref Range Status   MRSA by PCR Next Gen NOT DETECTED NOT DETECTED Final    Comment: (NOTE) The GeneXpert MRSA Assay (FDA approved for NASAL specimens only), is one component of a comprehensive MRSA colonization surveillance program. It is not intended to diagnose MRSA infection nor to guide or monitor treatment for MRSA infections. Test performance is not FDA approved in patients less than 19 years old. Performed at [facility], [address] [place], [place] [address]   C Difficile Quick Screen w PCR reflex     Status: None   Collection Time: 11/19/23 12:19 AM    Specimen: STOOL  Result Value Ref Range Status   C  Diff antigen NEGATIVE NEGATIVE Final   C Diff toxin NEGATIVE NEGATIVE Final   C Diff interpretation No C. difficile detected.  Final    Comment: Performed at [facility], [address] [place], [place] [address]    Radiology Report CT ABDOMEN PELVIS W CONTRAST Result Date: 11/18/2023 CLINICAL DATA:  Acute generalized abdominal pain EXAM: CT ABDOMEN AND PELVIS WITH CONTRAST TECHNIQUE: Multidetector CT imaging of the abdomen and pelvis was performed using the standard protocol following bolus administration of intravenous contrast. RADIATION DOSE REDUCTION: This exam was performed according to the departmental dose-optimization program which includes automated exposure control, adjustment of the mA and/or kV according to patient size and/or use of iterative reconstruction technique. CONTRAST:  75mL OMNIPAQUE  IOHEXOL  350 MG/ML SOLN COMPARISON:  October 29, 2023. FINDINGS: Lower chest: No acute abnormality. Hepatobiliary: Hepatic cirrhosis is noted. Minimal cholelithiasis. No biliary dilatation. Pancreas: Unremarkable. No pancreatic ductal dilatation or surrounding inflammatory changes. Spleen: Moderate splenomegaly is noted. Adrenals/Urinary Tract: Adrenal glands are unremarkable. Kidneys are normal, without renal calculi, focal lesion, or hydronephrosis. Bladder is unremarkable. Stomach/Bowel: Stomach is within normal limits. Appendix appears normal. No evidence of bowel wall thickening, distention, or inflammatory changes. Vascular/Lymphatic: Tortuosity of varices seen in left upper quadrant consistent with portal hypertension. No enlarged abdominal or pelvic lymph nodes. Reproductive: Status post hysterectomy. 2.2 cm left adnexal cyst is noted. Other: Moderate ascites is noted.  No definite hernia is noted. Musculoskeletal: No acute or significant osseous findings. IMPRESSION: Hepatic cirrhosis with moderate splenomegaly and moderate ascites and left  upper quadrant varices suggesting portal hypertension. Minimal cholelithiasis. Electronically Signed   By: Lynwood Landy Raddle M.D.   On: 11/18/2023 17:34     Signature  -   Lavada Stank M.D on 11/19/2023 at 8:27 AM   -  To page go to [email or web address]

## 2023-11-19 NOTE — H&P (View-Only) (Signed)
 Consultation Note   Referring Provider:  [facility] PCP: [name], MD Primary Gastroenterologist:  Victory Brand, MD / [facility]  Reason for Consultation: Lower GI bleeding DOA: 11/18/2023         Hospital Day: 2   ASSESSMENT    Patient Profile:  43 y.o. year old female with a medical history including but not limited to decompensated Nash cirrhosis with ascites and hepatic cephalopathy, diabetes, peripheral neuropathy, ADHD. Admitted with painless lower GI bleeding. Suspect rectal bleeding ( Hemorrhoids or maybe has developed rectal varices)  Decompensated MASH cirrhosis with esophageal varices, ascites, hepatic encephalopathy.  MELD 3.0: 22 at 11/19/2023  6:02 AM S/p LVP with removal of 6 L today - cell count negative for SBP Renal function normal.   Hypokalemia.  Presenting potassium 2.7.  Repletion in progress   Painless lower GI bleeding. Suspect rectal source (Hemorrhoids or maybe has developed rectal varices)   Principal Problem:   GIB (gastrointestinal bleeding) Active Problems:   Decompensated cirrhosis (HCC)      PLAN:   --Flexible sigmoidoscopy in am.. The risks and benefits of flexible sigmoidoscopy were discussed and the patient agrees to proceed.  -- Resuming her home lactulose  -- 2 g sodium restricted diet, n.p.o. after midnight.   -- Would resume home diuretics tomorrow with attention to electrolytes and renal function.  -- Patient has an upcoming follow-up with [facility]at[facility] in [place]   HPI   Brief history  Laurie Sutton is followed by us  and [facility] for decompensated MASH cirrhosis. She was admitted in late June with fevers / chills.  She had an LVP with removal of 3 L -no SBP . CT scan showed diffuse wall thickening of the ascending, transverse and descending colon worrisome for colitis She tested positive for both Campylobacter and C-diff. Treated with  Dificid  with resolution of symptoms.  A paracentesis that admission was negative for SBP .  Laurie Sutton was readmitted mid July with recurrent abdominal pain and crampy diarrhea as well as fever.  She had 2 LVP's with removal of 4.4 L and later 2.7 L  -no SBP . Repeat CT scan with contrast showed large amount of ascites , markedly severe colitis involving the ascending colon . Started on Rocephin  and Flagyl .  Repeat C. difficile and GI pathogen panel were negative, antibiotics discontinued.  Symptoms improved, discharged home on 10/31/2023.   Interval history Laurie Sutton presented to the ED yesterday for evaluation of rectal bleeding.  She woke up in the early morning hours of yesterday and had a bloody bowel movement.  Several hours later she had another episode.  No bowel movement since but she has continued to pass small amounts of r red blood when she urinates.  Her hemoglobin is declining..  She has never had a problem to rectal bleeding before.  She is not having diarrhea, bowel movements are at baseline with lactulose .  She has been having some generalized abdominal discomfort when she lays on her sides..  She had 6 L ascitic fluid removed short while ago and does notice some improvement in this discomfort.  No fevers or chills.  Laurie Sutton has been requiring more frequent LVP's since her bout with C. difficile in June.  She tells me that she follows up 2 g sodium diet.  She is compliant with her diuretics.     Labs and Imaging:  Recent Labs    11/18/23 1135 11/19/23 0602  PROT 6.6 6.0*  ALBUMIN  2.7* 2.5*  AST 39 34  ALT 25 23  ALKPHOS 94 82  BILITOT 3.8* 3.4*   Recent Labs    11/18/23 1135 11/18/23 1941 11/18/23 2224 11/19/23 0602  WBC 2.6*  --   --  2.4*  HGB 10.6* 10.3* 9.6* 9.9*  HCT 31.4* 30.6* 28.6* 29.9*  MCV 92.4  --   --  93.1  PLT 61*  --   --  60*   Recent Labs    11/18/23 1135 11/18/23 1941 11/19/23 0602  NA 132* 132* 133*  K 2.7* 2.9* 3.1*  CL 96* 96* 100  CO2 23 27  24   GLUCOSE 266* 209* 196*  BUN 5* <5* <5*  CREATININE 0.75 0.69 0.73  CALCIUM 8.2* 8.2* 8.1*     CT ABDOMEN PELVIS W CONTRAST CLINICAL DATA:  Acute generalized abdominal pain  EXAM: CT ABDOMEN AND PELVIS WITH CONTRAST  TECHNIQUE: Multidetector CT imaging of the abdomen and pelvis was performed using the standard protocol following bolus administration of intravenous contrast.  RADIATION DOSE REDUCTION: This exam was performed according to the departmental dose-optimization program which includes automated exposure control, adjustment of the mA and/or kV according to patient size and/or use of iterative reconstruction technique.  CONTRAST:  75mL OMNIPAQUE  IOHEXOL  350 MG/ML SOLN  COMPARISON:  October 29, 2023.  FINDINGS: Lower chest: No acute abnormality.  Hepatobiliary: Hepatic cirrhosis is noted. Minimal cholelithiasis. No biliary dilatation.  Pancreas: Unremarkable. No pancreatic ductal dilatation or surrounding inflammatory changes.  Spleen: Moderate splenomegaly is noted.  Adrenals/Urinary Tract: Adrenal glands are unremarkable. Kidneys are normal, without renal calculi, focal lesion, or hydronephrosis. Bladder is unremarkable.  Stomach/Bowel: Stomach is within normal limits. Appendix appears normal. No evidence of bowel wall thickening, distention, or inflammatory changes.  Vascular/Lymphatic: Tortuosity of varices seen in left upper quadrant consistent with portal hypertension. No enlarged abdominal or pelvic lymph nodes.  Reproductive: Status post hysterectomy. 2.2 cm left adnexal cyst is noted.  Other: Moderate ascites is noted.  No definite hernia is noted.  Musculoskeletal: No acute or significant osseous findings.  IMPRESSION: Hepatic cirrhosis with moderate splenomegaly and moderate ascites and left upper quadrant varices suggesting portal hypertension.  Minimal cholelithiasis.  Electronically Signed   By: Lynwood Landy Raddle M.D.   On:  11/18/2023 17:34    Pertinent GI Studies    EGD 08/02/2023 for follow-up on esophageal varices --Grade 2 esophageal varices --Portal hypertensive gastropathy --Nodular mucosa in the antrum --Normal examined duodenum --No specimens collected  Colonoscopy 08/02/2023 for rectal bleeding -Internal hemorrhoids  -Congested mucosa in the entire colon (portal colopathy) -One 4 mm polyp in the transverse colon was removed -Redundant colon   Past Medical History:  Diagnosis Date   ADHD    Allergy    Anemia    IRON TRANSFUSION 07-2020 NONE SINCE   Anxiety    Back pain    COVID 08/12/2019   ALL SYMPTOMS REOLVED PER PT   Depression    Diabetic neuropathy (HCC) 11/11/2020   FEET   dm type 2    sees Dr. Odella Jacobson at [facility]   Fatty liver    GERD (gastroesophageal reflux disease)    History of kidney stones    Joint pain    Menorrhagia 11/11/2020  Migraines    Murmur, cardiac    FAINT NO CARDIOLOGIST   Other fatigue    Pneumonia 08/12/2019   COVID PNEUMONIA ALL SYMPTOMS RESOLVED PER PT   Shortness of breath on exertion     Past Surgical History:  Procedure Laterality Date   CESAREAN SECTION  12/13/2006   COLONOSCOPY WITH PROPOFOL  N/A 08/02/2023   Procedure: COLONOSCOPY WITH PROPOFOL ;  Surgeon: Legrand Victory LITTIE DOUGLAS, MD;  Location: [facility];  Service: Gastroenterology;  Laterality: N/A;   ESOPHAGOGASTRODUODENOSCOPY (EGD) WITH PROPOFOL  N/A 08/02/2023   Procedure: ESOPHAGOGASTRODUODENOSCOPY (EGD) WITH PROPOFOL ;  Surgeon: Legrand Victory LITTIE DOUGLAS, MD;  Location: [facility];  Service: Gastroenterology;  Laterality: N/A;   EXTRACORPOREAL SHOCK WAVE LITHOTRIPSY  2018   FOOT SURGERY Right 10/02/2020   RIGHT FOOT HEEL AND TOES, [facility] OFF [address]   IR PARACENTESIS  07/06/2023   IR PARACENTESIS  08/18/2023   IR PARACENTESIS  10/05/2023   IR PARACENTESIS  10/11/2023   IR PARACENTESIS  10/20/2023   IR PARACENTESIS  11/08/2023   IR TRANSCATHETER BX  07/26/2023   IR US   GUIDE VASC ACCESS RIGHT  07/26/2023   IR VENOGRAM HEPATIC W HEMODYNAMIC EVALUATION  07/26/2023   LAPAROSCOPIC VAGINAL HYSTERECTOMY WITH SALPINGECTOMY Bilateral 11/17/2020   Procedure: LAPAROSCOPIC ASSISTED VAGINAL HYSTERECTOMY WITH BILATERAL SALPINGECTOMY;  Surgeon: Dannielle Bouchard, DO;  Location: [facility];  Service: Gynecology;  Laterality: Bilateral;   POLYPECTOMY  08/02/2023   Procedure: POLYPECTOMY, INTESTINE;  Surgeon: Legrand Victory LITTIE DOUGLAS, MD;  Location: [facility];  Service: Gastroenterology;;    Family History  Problem Relation Age of Onset   Colon polyps Mother    Depression Mother    Anxiety disorder Mother    Colon polyps Father    Sleep apnea Father    Anxiety disorder Father    Depression Father    Diabetes Father    Bladder Cancer Father 76   Rheum arthritis Father    Migraines Maternal Aunt    Migraines Maternal Grandmother    Diabetes Maternal Grandfather    Healthy Daughter    Asthma Daughter    Anxiety disorder Daughter    Anxiety disorder Son    Asthma Son    Healthy Son    Cerebral aneurysm Cousin    Heart disease Other    Depression Other    Anxiety disorder Other    Sleep apnea Other    Colon cancer Neg Hx    Esophageal cancer Neg Hx    Stomach cancer Neg Hx    Rectal cancer Neg Hx     Prior to Admission medications   Medication Sig Start Date End Date Taking? Authorizing Provider  acetaminophen  (TYLENOL ) 325 MG tablet Take 2 tablets (650 mg total) by mouth every 6 (six) hours as needed for mild pain (pain score 1-3) or fever (or Fever >/= 101). 10/07/23   Sherrill Cable Latif, DO  albuterol  (VENTOLIN  HFA) 108 [id number] Base) MCG/ACT inhaler INHALE 2 PUFFS INTO THE LUNGS UP TO EVERY 6 HOURS AS NEEDED FOR WHEEZING/SHORTNESS OF BREATH 08/20/22   [name], MD  clotrimazole -betamethasone  (LOTRISONE ) cream APPLY 1 APPLICATION TOPICALLY TWICE A DAY AS NEEDED 05/26/23   [name], MD  Continuous Glucose Sensor (DEXCOM G7 SENSOR) MISC Use 1  sensor for continuous glucose monitoring every 10 days for 30 days 05/25/23   Braulio Hough, MD  FLUoxetine  (PROZAC ) 40 MG capsule TAKE 1 CAPSULE BY MOUTH TWICE A DAY 07/26/23   [name], MD  furosemide  (LASIX ) 80 MG tablet Take 1 tablet (80 mg total) by mouth daily. 10/11/23   Sherrill Cable Latif, DO  HUMULIN  R U-500 KWIKPEN 500 UNIT/ML KwikPen Use 120 units at breakfast, 100 at lunch, and 100 at dinner Patient taking differently: Use 60 units at breakfast, 65 at lunch, and 65 at dinner 04/08/22   [name], MD  lactulose  (CHRONULAC ) 10 GM/15ML solution Take 15 mLs (10 g total) by mouth 3 (three) times daily. 10/31/23 11/30/23  Sira, Zackery, MD  montelukast  (SINGULAIR ) 10 MG tablet Take 1 tablet (10 mg total) by mouth at bedtime. 08/25/22   [name], MD  omeprazole  (PRILOSEC) 40 MG capsule TAKE 1 CAPSULE (40 MG TOTAL) BY MOUTH IN THE MORNING 10/23/23   Legrand Victory LITTIE DOUGLAS, MD  ondansetron  (ZOFRAN ) 4 MG tablet Take 1 tablet (4 mg total) by mouth every 8 (eight) hours as needed for nausea or vomiting. 10/18/23   Craig Alan SAUNDERS, PA-C  ondansetron  (ZOFRAN -ODT) 4 MG disintegrating tablet 1 tablet on the tongue and allow to dissolve by mouth three times a day; Duration: 30 days    [provider]  spironolactone  (ALDACTONE ) 100 MG tablet Take 3 tablets (300 mg total) by mouth daily. 11/01/23 12/01/23  Sira, Zackery, MD  spironolactone  (ALDACTONE ) 50 MG tablet Take 50 mg by mouth 2 (two) times daily. 11/17/23   [provider]  tirzepatide  (MOUNJARO ) 7.5 MG/0.5ML Pen Inject 7.5 mg Subcutaneous once a week 30 days Patient not taking: Reported on 10/29/2023 07/06/23     XIFAXAN 550 MG TABS tablet Take 550 mg by mouth 2 (two) times daily. 10/27/23   [provider]    Current Facility-Administered Medications  Medication Dose Route Frequency Provider Last Rate Last Admin   acetaminophen  (TYLENOL ) tablet 650 mg  650 mg Oral Q6H PRN Patel, Ekta V, MD       Or   acetaminophen   (TYLENOL ) suppository 650 mg  650 mg Rectal Q6H PRN Tobie Mario GAILS, MD       albumin  human 25 % solution 50 g  50 g Intravenous Q6H Singh, Prashant K, MD       albuterol  (VENTOLIN  HFA) 108 (90 Base) MCG/ACT inhaler 2 puff  2 puff Inhalation Q6H PRN Tobie Mario GAILS, MD       FLUoxetine  (PROZAC ) capsule 40 mg  40 mg Oral BID Patel, Ekta V, MD   40 mg at 11/19/23 9085   hydrALAZINE  (APRESOLINE ) injection 5 mg  5 mg Intravenous Q4H PRN Patel, Ekta V, MD       insulin  aspart (novoLOG ) injection 0-6 Units  0-6 Units Subcutaneous TID AC & HS Patsy Lenis, MD   2 Units at 11/18/23 2125   lactated ringers  bolus 250 mL  250 mL Intravenous Once PRN Singh, Prashant K, MD       midodrine  (PROAMATINE ) tablet 10 mg  10 mg Oral TID WC Singh, Prashant K, MD   10 mg at 11/19/23 0914   montelukast  (SINGULAIR ) tablet 10 mg  10 mg Oral QHS Patel, Ekta V, MD   10 mg at 11/18/23 2106   morphine  (PF) 2 MG/ML injection 2 mg  2 mg Intravenous Q4H PRN Patel, Ekta V, MD       ondansetron  (ZOFRAN ) tablet 4 mg  4 mg Oral Q6H PRN Tobie Mario GAILS, MD       Or   ondansetron  (ZOFRAN ) injection 4 mg  4 mg Intravenous Q6H PRN Tobie Mario GAILS, MD  pantoprazole  (PROTONIX ) injection 40 mg  40 mg Intravenous Q12H Tobie Modest V, MD   40 mg at 11/19/23 0915   phytonadione  (VITAMIN K ) 5 mg in dextrose  5 % 50 mL IVPB  5 mg Intravenous Once Dennise Lavada POUR, MD 50.5 mL/hr at 11/19/23 0914 5 mg at 11/19/23 0914   potassium chloride  SA (KLOR-CON  M) CR tablet 40 mEq  40 mEq Oral Once Dennise Lavada POUR, MD        Allergies as of 11/18/2023 - Review Complete 11/18/2023  Allergen Reaction Noted   Vancomycin  Itching 10/04/2023   Amoxicillin Hives and Itching 02/16/2011   Azithromycin   07/25/2017   Dulaglutide  Other (See Comments) 08/21/2020   Empagliflozin -metformin  hcl er Other (See Comments) and Swelling 08/21/2020   Januvia  [sitagliptin ] Other (See Comments) 11/18/2014   Latex  07/25/2017   Metformin   12/16/2022   Penicillins Hives  02/16/2011    Social History   Socioeconomic History   Marital status: Married    Spouse name: Adam   Number of children: 2   Years of education: Not on file   Highest education level: 12th grade  Occupational History   Occupation: Arts administrator and Nutrition    Employer: [employer]    Comment: works 20 hours per week  Tobacco Use   Smoking status: Never    Passive exposure: Current   Smokeless tobacco: Never  Vaping Use   Vaping status: Never Used  Substance and Sexual Activity   Alcohol use: No    Alcohol/week: 0.0 standard drinks of alcohol   Drug use: No   Sexual activity: Yes    Partners: Male    Birth control/protection: None  Other Topics Concern   Not on file  Social History Narrative   Not on file   Social Drivers of Health   Financial Resource Strain: Low Risk  (09/19/2022)   Overall Financial Resource Strain (CARDIA)    Difficulty of Paying Living Expenses: Not hard at all  Food Insecurity: No Food Insecurity (11/18/2023)   Hunger Vital Sign    Worried About Running Out of Food in the Last Year: Never true    Ran Out of Food in the Last Year: Never true  Transportation Needs: No Transportation Needs (11/18/2023)   PRAPARE - Administrator, Civil Service (Medical): No    Lack of Transportation (Non-Medical): No  Physical Activity: Insufficiently Active (06/04/2021)   Exercise Vital Sign    Days of Exercise per Week: 2 days    Minutes of Exercise per Session: 10 min  Stress: Stress Concern Present (09/19/2022)   [employer] of Occupational Health - Occupational Stress Questionnaire    Feeling of Stress : To some extent  Social Connections: Socially Integrated (10/05/2023)   Social Connection and Isolation Panel    Frequency of Communication with Friends and Family: More than three times a week    Frequency of Social Gatherings with Friends and Family: Twice a week    Attends Religious Services: More than 4 times per year    Active Member  of [employer] or Organizations: No    Attends Engineer, structural: More than 4 times per year    Marital Status: Married  Catering manager Violence: Not At Risk (11/18/2023)   Humiliation, Afraid, Rape, and Kick questionnaire    Fear of Current or Ex-Partner: No    Emotionally Abused: No    Physically Abused: No    Sexually Abused: No     Code Status  Code Status: Full Code  Review of Systems: All systems reviewed and negative except where noted in HPI.  Physical Exam: Vital signs in last 24 hours: Temp:  [97.7 F (36.5 C)-98.6 F (37 C)] 97.9 F (36.6 C) (08/09 0811) Pulse Rate:  [72-85] 72 (08/09 0811) Resp:  [16-22] 20 (08/09 0811) BP: (98-122)/(51-60) 98/55 (08/09 0811) SpO2:  [93 %-100 %] 100 % (08/09 0811) Weight:  [89.4 kg] 89.4 kg (08/08 1131) Last BM Date : 11/18/23  General:  Pleasant female in NAD Psych:  Cooperative. Normal mood and affect Eyes: Pupils equal Ears:  Normal auditory acuity Nose: No deformity, discharge or lesions Neck:  Supple, no masses felt Lungs:  Clear to auscultation.  Heart:  Regular rate, regular rhythm.  Abdomen:  Soft, mildly distended,  nontender, active bowel sounds, no masses felt Rectal : She had no pain on DRE.  No masses felt.  Very small amount of watery blood-tinged fluid in vault .  Msk: Symmetrical without gross deformities.  Neurologic:  Alert, oriented, grossly normal neurologically.  A little sleepy but she has no asterixis on exam.  Extremities : No edema Skin:  Intact without significant lesions.    Intake/Output from previous day: 08/08 0701 - 08/09 0700 In: 33.9 [IV Piggyback:33.9] Out: -  Intake/Output this shift:  No intake/output data recorded.   [name], NP-C   11/19/2023, 9:58 AM

## 2023-11-20 ENCOUNTER — Encounter (HOSPITAL_COMMUNITY): Payer: Self-pay | Admitting: Internal Medicine

## 2023-11-20 ENCOUNTER — Inpatient Hospital Stay (HOSPITAL_COMMUNITY): Admitting: Anesthesiology

## 2023-11-20 ENCOUNTER — Encounter (HOSPITAL_COMMUNITY)
Admission: EM | Disposition: A | Discharge status: Home / Self Care | Payer: Self-pay | Source: Ambulatory Visit | Attending: Internal Medicine

## 2023-11-20 DIAGNOSIS — K648 Other hemorrhoids: Secondary | ICD-10-CM | POA: Diagnosis not present

## 2023-11-20 DIAGNOSIS — E119 Type 2 diabetes mellitus without complications: Secondary | ICD-10-CM | POA: Diagnosis not present

## 2023-11-20 DIAGNOSIS — K625 Hemorrhage of anus and rectum: Secondary | ICD-10-CM

## 2023-11-20 DIAGNOSIS — A419 Sepsis, unspecified organism: Secondary | ICD-10-CM

## 2023-11-20 DIAGNOSIS — F418 Other specified anxiety disorders: Secondary | ICD-10-CM

## 2023-11-20 HISTORY — PX: FLEXIBLE SIGMOIDOSCOPY: SHX5431

## 2023-11-20 LAB — CBC WITH DIFFERENTIAL/PLATELET
Abs Immature Granulocytes: 0 10*3/uL (ref 0.00–0.07)
Abs Immature Granulocytes: 0.04 K/uL (ref 0.00–0.07)
Basophils Absolute: 0 10*3/uL (ref 0.0–0.1)
Basophils Absolute: 0 K/uL (ref 0.0–0.1)
Basophils Relative: 1 %
Basophils Relative: 1 %
Eosinophils Absolute: 0.1 10*3/uL (ref 0.0–0.5)
Eosinophils Absolute: 0.2 K/uL (ref 0.0–0.5)
Eosinophils Relative: 5 %
Eosinophils Relative: 5 %
HCT: 28.2 % — ABNORMAL LOW (ref 36.0–46.0)
HCT: 32.3 % — ABNORMAL LOW (ref 36.0–46.0)
Hemoglobin: 9.4 g/dL — ABNORMAL LOW (ref 12.0–15.0)
Hemoglobin: 10.6 g/dL — ABNORMAL LOW (ref 12.0–15.0)
Immature Granulocytes: 0 %
Immature Granulocytes: 1 %
Lymphocytes Relative: 20 %
Lymphocytes Relative: 23 %
Lymphs Abs: 0.5 10*3/uL — ABNORMAL LOW (ref 0.7–4.0)
Lymphs Abs: 0.6 K/uL — ABNORMAL LOW (ref 0.7–4.0)
MCH: 30.9 pg (ref 26.0–34.0)
MCH: 30.8 pg (ref 26.0–34.0)
MCHC: 33.3 g/dL (ref 30.0–36.0)
MCHC: 32.8 g/dL (ref 30.0–36.0)
MCV: 92.8 fL (ref 80.0–100.0)
MCV: 93.9 fL (ref 80.0–100.0)
Monocytes Absolute: 0.4 10*3/uL (ref 0.1–1.0)
Monocytes Absolute: 0.3 K/uL (ref 0.1–1.0)
Monocytes Relative: 15 %
Monocytes Relative: 11 %
Neutro Abs: 1.5 10*3/uL — ABNORMAL LOW (ref 1.7–7.7)
Neutro Abs: 1.7 K/uL (ref 1.7–7.7)
Neutrophils Relative %: 59 %
Neutrophils Relative %: 59 %
Platelets: 55 10*3/uL — ABNORMAL LOW (ref 150–400)
Platelets: 71 K/uL — ABNORMAL LOW (ref 150–400)
RBC: 3.04 MIL/uL — ABNORMAL LOW (ref 3.87–5.11)
RBC: 3.44 MIL/uL — ABNORMAL LOW (ref 3.87–5.11)
RDW: 14.8 % (ref 11.5–15.5)
RDW: 14.7 % (ref 11.5–15.5)
WBC: 2.4 10*3/uL — ABNORMAL LOW (ref 4.0–10.5)
WBC: 2.8 K/uL — ABNORMAL LOW (ref 4.0–10.5)
nRBC: 0 % (ref 0.0–0.2)
nRBC: 0 % (ref 0.0–0.2)

## 2023-11-20 LAB — COMPREHENSIVE METABOLIC PANEL WITH GFR
ALT: 22 U/L (ref 0–44)
AST: 38 U/L (ref 15–41)
Albumin: 3.1 g/dL — ABNORMAL LOW (ref 3.5–5.0)
Alkaline Phosphatase: 79 U/L (ref 38–126)
Anion gap: 10 (ref 5–15)
BUN: <5 mg/dL — ABNORMAL LOW (ref 6–20)
CO2: 23 mmol/L (ref 22–32)
Calcium: 8.6 mg/dL — ABNORMAL LOW (ref 8.9–10.3)
Chloride: 101 mmol/L (ref 98–111)
Creatinine, Ser: 0.7 mg/dL (ref 0.44–1.00)
GFR, Estimated: >60 mL/min (ref >60)
Glucose, Bld: 175 mg/dL — ABNORMAL HIGH (ref 70–99)
Potassium: 3.2 mmol/L — ABNORMAL LOW (ref 3.5–5.1)
Sodium: 134 mmol/L — ABNORMAL LOW (ref 135–145)
Total Bilirubin: 3.9 mg/dL — ABNORMAL HIGH (ref 0.0–1.2)
Total Protein: 6.7 g/dL (ref 6.5–8.1)

## 2023-11-20 LAB — MAGNESIUM: Magnesium: 1.8 mg/dL (ref 1.7–2.4)

## 2023-11-20 LAB — GLUCOSE, CAPILLARY
Glucose-Capillary: 173 mg/dL — ABNORMAL HIGH (ref 70–99)
Glucose-Capillary: 150 mg/dL — ABNORMAL HIGH (ref 70–99)
Glucose-Capillary: 183 mg/dL — ABNORMAL HIGH (ref 70–99)
Glucose-Capillary: 178 mg/dL — ABNORMAL HIGH (ref 70–99)

## 2023-11-20 LAB — PROTIME-INR
INR: 1.7 — ABNORMAL HIGH (ref 0.8–1.2)
Prothrombin Time: 20.9 s — ABNORMAL HIGH (ref 11.4–15.2)

## 2023-11-20 SURGERY — SIGMOIDOSCOPY, FLEXIBLE
Anesthesia: Monitor Anesthesia Care

## 2023-11-20 MED ORDER — SODIUM CHLORIDE 0.9 % IV BOLUS
500.0000 mL | Freq: Once | INTRAVENOUS | Status: AC
  Administered 2023-11-20: 500 mL via INTRAVENOUS

## 2023-11-20 MED ORDER — LACTATED RINGERS IV BOLUS
500.0000 mL | Freq: Once | INTRAVENOUS | Status: AC
  Administered 2023-11-20: 500 mL via INTRAVENOUS

## 2023-11-20 MED ORDER — HYDROCORT-PRAMOXINE (PERIANAL) 2.5-1 % EX CREA
TOPICAL_CREAM | Freq: Three times a day (TID) | CUTANEOUS | Status: DC
  Filled 2023-11-20: qty 30

## 2023-11-20 MED ORDER — PROPOFOL 10 MG/ML IV BOLUS
INTRAVENOUS | Status: DC | PRN
  Administered 2023-11-20: 50 mg via INTRAVENOUS

## 2023-11-20 MED ORDER — FLUOXETINE HCL 20 MG PO CAPS
40.0000 mg | ORAL_CAPSULE | Freq: Every day | ORAL | Status: DC
Start: 2023-11-21 — End: 2023-11-21
  Administered 2023-11-21 (×2): 40 mg via ORAL
  Filled 2023-11-20: qty 2

## 2023-11-20 MED ORDER — ALBUMIN HUMAN 25 % IV SOLN
50.0000 g | Freq: Once | INTRAVENOUS | Status: AC
  Administered 2023-11-20: 50 g via INTRAVENOUS
  Filled 2023-11-20: qty 200

## 2023-11-20 MED ORDER — HYDROCORTISONE ACETATE 25 MG RE SUPP
25.0000 mg | Freq: Every day | RECTAL | Status: DC
  Administered 2023-11-20: 25 mg via RECTAL
  Filled 2023-11-20 (×3): qty 1

## 2023-11-20 MED ORDER — PROPOFOL 500 MG/50ML IV EMUL
INTRAVENOUS | Status: DC | PRN
  Administered 2023-11-20: 110 ug/kg/min via INTRAVENOUS

## 2023-11-20 MED ORDER — LIDOCAINE 2% (20 MG/ML) 5 ML SYRINGE
INTRAMUSCULAR | Status: DC | PRN
  Administered 2023-11-20: 40 mg via INTRAVENOUS

## 2023-11-20 MED ORDER — SODIUM CHLORIDE 0.9 % IV SOLN: INTRAVENOUS | Status: DC | PRN

## 2023-11-20 MED ORDER — PHENYLEPHRINE 80 MCG/ML (10ML) SYRINGE FOR IV PUSH (FOR BLOOD PRESSURE SUPPORT)
PREFILLED_SYRINGE | INTRAVENOUS | Status: DC | PRN
  Administered 2023-11-20 (×3): 80 ug via INTRAVENOUS

## 2023-11-20 MED ORDER — LINEZOLID 600 MG PO TABS
600.0000 mg | ORAL_TABLET | Freq: Two times a day (BID) | ORAL | Status: DC
  Administered 2023-11-20 – 2023-11-21 (×3): 600 mg via ORAL
  Filled 2023-11-20 (×2): qty 1

## 2023-11-20 MED ORDER — DEXMEDETOMIDINE HCL IN NACL 80 MCG/20ML IV SOLN
INTRAVENOUS | Status: DC | PRN
  Administered 2023-11-20: 8 ug via INTRAVENOUS

## 2023-11-20 MED ORDER — POTASSIUM CHLORIDE CRYS ER 20 MEQ PO TBCR
40.0000 meq | EXTENDED_RELEASE_TABLET | Freq: Two times a day (BID) | ORAL | Status: AC
  Administered 2023-11-20 (×2): 40 meq via ORAL
  Filled 2023-11-20 (×2): qty 2

## 2023-11-20 NOTE — Anesthesia Postprocedure Evaluation (Signed)
 Anesthesia Post Note  Patient: Laurie Sutton  Procedure(s) Performed: [facility] SIDE     Patient location during evaluation: PACU Anesthesia Type: MAC Level of consciousness: awake and alert Pain management: pain level controlled Vital Signs Assessment: post-procedure vital signs reviewed and stable Respiratory status: spontaneous breathing, nonlabored ventilation and respiratory function stable Cardiovascular status: stable and blood pressure returned to baseline Anesthetic complications: no   No notable events documented.  Last Vitals:  Vitals:   11/20/23 1410 11/20/23 1420  BP: (!) 81/47   Pulse: 61 65  Resp: 16 16  Temp:    SpO2: 95% 95%    Last Pain:  Vitals:   11/20/23 1400  TempSrc:   PainSc: 0-No pain                 Debby FORBES Like

## 2023-11-20 NOTE — Progress Notes (Signed)
 PROGRESS NOTE                                                                                                                                                                                                             Patient Demographics:    Laurie Sutton, is a 43 y.o. female, DOB - 27-Sep-1980, FMW:[id number]  Outpatient Primary MD for the patient is [name], MD    LOS - 2  Admit date - 11/18/2023    Chief Complaint  Patient presents with   Rectal Bleeding       Brief Narrative (HPI from H&P)   43 y.o. female with past medical history  of allergy to penicillin causing hives and itching and angioedema with a rash facial tongue throat swelling shortness of breath and lightheadedness with dizziness and hypotension, also allergic to vancomycin  amoxicillin azithromycin  dulaglutide , Januvia , metformin , Jardiance , latex, migraine, GERD, depression with anxiety, type 2 diabetes, decompensated liver cirrhosis secondary to NASH , splenic varices esophageal varices portal colopathy  esophageal varices portal hypertensive gastropathy, frequent UTI, hypertension, C. difficile colitis in June 2005 with a negative retest for C. difficile in July 2005.    Patient is being admitted today for rectal bleeding that started yesterday patient states that she does feel dizzy and has some weakness but no other than the abdominal distention and discomfort in the rectal bleeding she feels okay.  Patient is following up with hepatology clinic with [facility] where she is also being worked up for possible liver transplant.:    Subjective:   Patient in bed, appears comfortable, denies any headache, no fever, no chest pain or pressure, no shortness of breath , no abdominal pain. No new focal weakness.  Still has intermittent blood in her stool once or twice a day.   Assessment  & Plan :   Bright red blood per rectum.  With some dizziness,  fortunately H&H is stable, type screen done, on IV PPI, likely lower GI bleed question if hemorrhoidal, GI on board.  Due for sigmoidoscopy on 11/20/2023, CBC is actually stable and gradually rising, continue to monitor.  NASH with cirrhosis, recurrent ascites and pancytopenia.  Supportive care, had ultrasound-guided paracentesis on 11/19/2023 with 6 L of fluid removed, no signs of SBP.  Monitor with supportive care, INR is supratherapeutic trial of vitamin K  on 11/19/2023  as well.   Chronic asymptomatic hypotension.  Supportive care.    Hypokalemia.  Replaced.  History of C. difficile.  Currently constipated, clinically resolved.  DM type II.  On diet control.      Condition - Extremely Guarded  Family Communication  : Husband Juliene [phone]  over the phone on 11/19/2023 and 11/20/2023  Code Status : Full code  Consults  : [facility], IR  PUD Prophylaxis : PPI   Procedures  :     US  guided paracentesis requested 11/19/2023.  CT Hepatic cirrhosis with moderate splenomegaly and moderate ascites and left upper quadrant varices suggesting portal hypertension. Minimal cholelithiasis      Disposition Plan  :    Status is: Inpatient   DVT Prophylaxis  :    Place TED hose Start: 11/19/23 0826 SCDs Start: 11/18/23 1601    Lab Results  Component Value Date   PLT 71 (L) 11/20/2023    Diet :  Diet Order             Diet NPO time specified Except for: Sips with Meds  Diet effective midnight                    Inpatient Medications  Scheduled Meds:  FLUoxetine   40 mg Oral BID   insulin  aspart  0-6 Units Subcutaneous TID AC & HS   lactulose   20 g Oral TID   midodrine   10 mg Oral TID WC   montelukast   10 mg Oral QHS   pantoprazole  (PROTONIX ) IV  40 mg Intravenous Q12H   Continuous Infusions:   PRN Meds:.acetaminophen  **OR** acetaminophen , albuterol , hydrALAZINE , morphine  injection, ondansetron  **OR** ondansetron  (ZOFRAN ) IV  Antibiotics  :    Anti-infectives  (From admission, onward)    None         Objective:   Vitals:   11/20/23 0410 11/20/23 0609 11/20/23 0610 11/20/23 0806  BP: 100/66 (!) 83/46 (!) 95/36 (!) 95/52  Pulse: 72   61  Resp:  17 (!) 23 20  Temp: 98.9 F (37.2 C)   98.6 F (37 C)  TempSrc: Oral   Oral  SpO2:      Weight:      Height:        Wt Readings from Last 3 Encounters:  11/18/23 89.4 kg  10/28/23 89.4 kg  10/18/23 89.4 kg    No intake or output data in the 24 hours ending 11/20/23 0907    Physical Exam  Awake Alert, No new F.N deficits, Normal affect [place].AT,PERRAL Supple Neck, No JVD,   Symmetrical Chest wall movement, Good air movement bilaterally, CTAB RRR,No Gallops,Rubs or new Murmurs,  +ve B.Sounds, Abd distended but nontender, has ascites moderate to severe,  No Cyanosis, Clubbing or edema     RN pressure injury documentation:      Data Review:    Recent Labs  Lab 11/18/23 1135 11/18/23 1941 11/18/23 2224 11/19/23 0602 11/19/23 1501 11/20/23 0043 11/20/23 0740  WBC 2.6*  --   --  2.4* 2.0* 2.4* 2.8*  HGB 10.6*   < > 9.6* 9.9* 8.8* 9.4* 10.6*  HCT 31.4*   < > 28.6* 29.9* 26.1* 28.2* 32.3*  PLT 61*  --   --  60* 55* 55* 71*  MCV 92.4  --   --  93.1 92.6 92.8 93.9  MCH 31.2  --   --  30.8 31.2 30.9 30.8  MCHC 33.8  --   --  33.1 33.7 33.3 32.8  RDW 14.8  --   --  14.8 14.7 14.8 14.7  LYMPHSABS  --   --   --  0.5* 0.4* 0.5* 0.6*  MONOABS  --   --   --  0.3 0.3 0.4 0.3  EOSABS  --   --   --  0.2 0.1 0.1 0.2  BASOSABS  --   --   --  0.0 0.0 0.0 0.0   < > = values in this interval not displayed.    Recent Labs  Lab 11/18/23 1135 11/18/23 1528 11/18/23 1616 11/18/23 1941 11/19/23 0602 11/20/23 0740  NA 132*  --   --  132* 133*  --   K 2.7*  --   --  2.9* 3.1*  --   CL 96*  --   --  96* 100  --   CO2 23  --   --  27 24  --   ANIONGAP 13  --   --  9 9  --   GLUCOSE 266*  --   --  209* 196*  --   BUN 5*  --   --  <5* <5*  --   CREATININE 0.75  --   --  0.69 0.73  --    AST 39  --   --   --  34  --   ALT 25  --   --   --  23  --   ALKPHOS 94  --   --   --  82  --   BILITOT 3.8*  --   --   --  3.4*  --   ALBUMIN  2.7*  --   --   --  2.5*  --   INR  --   --  1.7*  --   --  1.7*  MG  --  1.6*  --   --  2.0  --   PHOS  --   --   --   --  2.9  --   CALCIUM 8.2*  --   --  8.2* 8.1*  --       Recent Labs  Lab 11/18/23 1135 11/18/23 1528 11/18/23 1616 11/18/23 1941 11/19/23 0602 11/20/23 0740  INR  --   --  1.7*  --   --  1.7*  MG  --  1.6*  --   --  2.0  --   CALCIUM 8.2*  --   --  8.2* 8.1*  --     --------------------------------------------------------------------------------------------------------------- Lab Results  Component Value Date   CHOL 169 08/19/2017   HDL 38 08/19/2017   LDLCALC 91 08/19/2017   TRIG 202 (A) 08/19/2017   CHOLHDL 4 05/12/2015    Lab Results  Component Value Date   HGBA1C 6.1 (H) 10/05/2023   No results for input(s): TSH, T4TOTAL, FREET4, T3FREE, THYROIDAB in the last 72 hours. No results for input(s): VITAMINB12, FOLATE, FERRITIN, TIBC, IRON, RETICCTPCT in the last 72 hours. ------------------------------------------------------------------------------------------------------------------ Cardiac Enzymes No results for input(s): CKMB, TROPONINI, MYOGLOBIN in the last 168 hours.  Invalid input(s): CK  Micro Results Recent Results (from the past 240 hours)  MRSA Next Gen by PCR, Nasal     Status: None   Collection Time: 11/18/23 11:49 PM   Specimen: Nasal Mucosa; Nasal Swab  Result Value Ref Range Status   MRSA by PCR Next Gen NOT DETECTED NOT DETECTED Final    Comment: (NOTE) The GeneXpert MRSA Assay (FDA approved for NASAL specimens only), is one component  of a comprehensive MRSA colonization surveillance program. It is not intended to diagnose MRSA infection nor to guide or monitor treatment for MRSA infections. Test performance is not FDA approved in patients less than  56 years old. Performed at [facility], [address], [place], [place] [address]   C Difficile Quick Screen w PCR reflex     Status: None   Collection Time: 11/19/23 12:19 AM   Specimen: STOOL  Result Value Ref Range Status   C Diff antigen NEGATIVE NEGATIVE Final   C Diff toxin NEGATIVE NEGATIVE Final   C Diff interpretation No C. difficile detected.  Final    Comment: Performed at [facility], [address], [place], [place] [address]  Gastrointestinal Panel by PCR , Stool     Status: None   Collection Time: 11/19/23 12:19 AM   Specimen: Stool  Result Value Ref Range Status   Campylobacter species NOT DETECTED NOT DETECTED Final   Plesimonas shigelloides NOT DETECTED NOT DETECTED Final   Salmonella species NOT DETECTED NOT DETECTED Final   Yersinia enterocolitica NOT DETECTED NOT DETECTED Final   Vibrio species NOT DETECTED NOT DETECTED Final   Vibrio cholerae NOT DETECTED NOT DETECTED Final   Enteroaggregative E coli (EAEC) NOT DETECTED NOT DETECTED Final   Enteropathogenic E coli (EPEC) NOT DETECTED NOT DETECTED Final   Enterotoxigenic E coli (ETEC) NOT DETECTED NOT DETECTED Final   Shiga like toxin producing E coli (STEC) NOT DETECTED NOT DETECTED Final   Shigella/Enteroinvasive E coli (EIEC) NOT DETECTED NOT DETECTED Final   Cryptosporidium NOT DETECTED NOT DETECTED Final   Cyclospora cayetanensis NOT DETECTED NOT DETECTED Final   Entamoeba histolytica NOT DETECTED NOT DETECTED Final   Giardia lamblia NOT DETECTED NOT DETECTED Final   Adenovirus F40/41 NOT DETECTED NOT DETECTED Final   Astrovirus NOT DETECTED NOT DETECTED Final   Norovirus GI/GII NOT DETECTED NOT DETECTED Final   Rotavirus A NOT DETECTED NOT DETECTED Final   Sapovirus (I, II, IV, and V) NOT DETECTED NOT DETECTED Final    Comment: Performed at [facility], [address] [place], [place] [address]  Gram stain     Status: None   Collection Time: 11/19/23 11:27 AM   Specimen:  Peritoneal Washings  Result Value Ref Range Status   Specimen Description PERITONEAL  Final   Special Requests ABDOMEN  Final   Gram Stain   Final    RARE WBC PRESENT, PREDOMINANTLY PMN NO ORGANISMS SEEN Performed at [facility], [address] [place], [place] [address]    Report Status 11/19/2023 FINAL  Final    Radiology Report US  Paracentesis Result Date: 11/19/2023 INDICATION: 43 year old female with history of NASH cirrhosis column with recurrent ascites. IR was requested for diagnostic and therapeutic paracentesis. 6 L maximum. EXAM: ULTRASOUND GUIDED DIAGNOSTIC AND THERAPEUTIC PARACENTESIS MEDICATIONS: 8 cc of 1% lidocaine  COMPLICATIONS: None immediate. PROCEDURE: Informed written consent was obtained from the patient after a discussion of the risks, benefits and alternatives to treatment. A timeout was performed prior to the initiation of the procedure. Initial ultrasound scanning demonstrates a large amount of ascites within the right lower abdominal quadrant. The right lower abdomen was prepped and draped in the usual sterile fashion. 1% lidocaine  was used for local anesthesia. Following this, a 19 gauge, 7-cm, Yueh catheter was introduced. An ultrasound image was saved for documentation purposes. The paracentesis was performed. The catheter was removed and a dressing was applied. The patient tolerated the  procedure well without immediate post procedural complication. Patient received post-procedure intravenous albumin ; see nursing notes for details. FINDINGS: A total of approximately 6 L of clear, straw-colored peritoneal fluid was removed. Samples were sent to the laboratory as requested by the clinical team. IMPRESSION: Successful ultrasound-guided paracentesis yielding 6 liters of peritoneal fluid. Procedure performed by Carlin Griffon, PA-C Electronically Signed   By: Juliene Balder M.D.   On: 11/19/2023 20:28   CT ABDOMEN PELVIS W CONTRAST Result Date: 11/18/2023 CLINICAL DATA:   Acute generalized abdominal pain EXAM: CT ABDOMEN AND PELVIS WITH CONTRAST TECHNIQUE: Multidetector CT imaging of the abdomen and pelvis was performed using the standard protocol following bolus administration of intravenous contrast. RADIATION DOSE REDUCTION: This exam was performed according to the departmental dose-optimization program which includes automated exposure control, adjustment of the mA and/or kV according to patient size and/or use of iterative reconstruction technique. CONTRAST:  75mL OMNIPAQUE  IOHEXOL  350 MG/ML SOLN COMPARISON:  October 29, 2023. FINDINGS: Lower chest: No acute abnormality. Hepatobiliary: Hepatic cirrhosis is noted. Minimal cholelithiasis. No biliary dilatation. Pancreas: Unremarkable. No pancreatic ductal dilatation or surrounding inflammatory changes. Spleen: Moderate splenomegaly is noted. Adrenals/Urinary Tract: Adrenal glands are unremarkable. Kidneys are normal, without renal calculi, focal lesion, or hydronephrosis. Bladder is unremarkable. Stomach/Bowel: Stomach is within normal limits. Appendix appears normal. No evidence of bowel wall thickening, distention, or inflammatory changes. Vascular/Lymphatic: Tortuosity of varices seen in left upper quadrant consistent with portal hypertension. No enlarged abdominal or pelvic lymph nodes. Reproductive: Status post hysterectomy. 2.2 cm left adnexal cyst is noted. Other: Moderate ascites is noted.  No definite hernia is noted. Musculoskeletal: No acute or significant osseous findings. IMPRESSION: Hepatic cirrhosis with moderate splenomegaly and moderate ascites and left upper quadrant varices suggesting portal hypertension. Minimal cholelithiasis. Electronically Signed   By: Lynwood Landy Raddle M.D.   On: 11/18/2023 17:34     Signature  -   Lavada Stank M.D on 11/20/2023 at 9:07 AM   -  To page go to [email or web address]

## 2023-11-20 NOTE — Progress Notes (Signed)
Tap water enema given as ordered.

## 2023-11-20 NOTE — Op Note (Signed)
 [facility] Patient Name: Laurie Sutton Procedure Date : 11/20/2023 MRN: [id number] Attending MD: Gordy CHRISTELLA Starch , MD, [id number] Date of Birth: 12-19-1980 CSN: [id number] Age: 43 Admit Type: Inpatient Procedure:                Flexible Sigmoidoscopy Indications:              Rectal hemorrhage, recurrent - in patient with                            history of decompensated MASH cirrhosis with portal                            HTN, esophageal varices, thrombocytopenia Providers:                Gordy CHRISTELLA. Starch, MD, Collene Edu, RN, Felice Sar,                            Technician Referring MD:             [facility] Medicines:                Monitored Anesthesia Care Complications:            No immediate complications. Estimated Blood Loss:     Estimated blood loss: none. Procedure:                Pre-Anesthesia Assessment:                           - Prior to the procedure, a History and Physical                            was performed, and patient medications and                            allergies were reviewed. The patient's tolerance of                            previous anesthesia was also reviewed. The risks                            and benefits of the procedure and the sedation                            options and risks were discussed with the patient.                            All questions were answered, and informed consent                            was obtained. Prior Anticoagulants: The patient has                            taken no anticoagulant or antiplatelet agents. ASA  Grade Assessment: III - A patient with severe                            systemic disease. After reviewing the risks and                            benefits, the patient was deemed in satisfactory                            condition to undergo the procedure.                           After obtaining informed consent, the scope was                             passed under direct vision. The PCF-HQ190L                            ([id number]) Olympus colonoscope was introduced                            through the anus and advanced to the descending                            colon. The flexible sigmoidoscopy was accomplished                            without difficulty. The patient tolerated the                            procedure well. The quality of the bowel                            preparation was adequate. Scope In: 1:44:54 PM Scope Out: 1:48:59 PM Total Procedure Duration: 0 hours 4 minutes 5 seconds  Findings:      Hemorrhoids were found on perianal exam.      The rectum, sigmoid colon and descending colon appeared normal.      Internal hemorrhoids were found during retroflexion and during digital       exam. The hemorrhoids were medium-sized. Impression:               - The rectum, sigmoid colon and descending colon                            are normal. Fecal matter in descending colon                            nonbloody and non-melenic.                           - Internal hemorrhoids. This is the source of                            intermittent rectal bleeding.                           -  No specimens collected. Moderate Sedation:      N/A Recommendation:           - Return patient to hospital ward for ongoing care.                           - Resume previous diet.                           - Continue present medications.                           - Anusol  supp 25 mg qHS x 5 nights can be used to                            improve hemorrhoidal swelling.                           - She will followup with [facility] and [facility]. Procedure Code(s):        --- Professional ---                           [phone], Sigmoidoscopy, flexible; diagnostic,                            including collection of specimen(s) by brushing or                            washing, when  performed (separate procedure) Diagnosis Code(s):        --- Professional ---                           K64.8, Other hemorrhoids                           K62.5, Hemorrhage of anus and rectum CPT copyright 2022 American Medical Association. All rights reserved. The codes documented in this report are preliminary and upon coder review may  be revised to meet current compliance requirements. Gordy CHRISTELLA Starch, MD 11/20/2023 1:55:59 PM This report has been signed electronically. Number of Addenda: 0

## 2023-11-20 NOTE — Plan of Care (Signed)

## 2023-11-20 NOTE — Transfer of Care (Signed)
 Immediate Anesthesia Transfer of Care Note  Patient: Laurie Sutton  Procedure(s) Performed: [place] [facility]  Patient Location: Endoscopy Unit  Anesthesia Type:MAC  Level of Consciousness: drowsy  Airway & Oxygen Therapy: Patient connected to nasal cannula oxygen  Post-op Assessment: Report given to RN and Post -op Vital signs reviewed and stable  Post vital signs: Reviewed and stable  Last Vitals:  Vitals Value Taken Time  BP    Temp    Pulse 65 11/20/23 13:55  Resp 20 11/20/23 13:55  SpO2 96 % 11/20/23 13:55  Vitals shown include unfiled device data.  Last Pain:  Vitals:   11/20/23 1210  TempSrc: Temporal  PainSc: 0-No pain      Patients Stated Pain Goal: 0 (11/18/23 2000)  Complications: No notable events documented.

## 2023-11-20 NOTE — Anesthesia Procedure Notes (Signed)
 Procedure Name: MAC Date/Time: 11/20/2023 1:27 PM  Performed by: Loreli Blima LABOR, CRNAPre-anesthesia Checklist: Patient identified, Emergency Drugs available, Patient being monitored, Suction available and Timeout performed Oxygen Delivery Method: Nasal cannula Placement Confirmation: positive ETCO2

## 2023-11-20 NOTE — Interval H&P Note (Signed)
 History and Physical Interval Note: For flexible sigmoidoscopy today to evaluate recent rectal bleeding, exclude colitis and rectal varices in the setting of cirrhosis with portal hypertension The nature of the procedure, as well as the risks, benefits, and alternatives were carefully and thoroughly reviewed with the patient. Ample time for discussion and questions allowed. The patient understood, was satisfied, and agreed to proceed.      Latest Ref Rng & Units 11/20/2023    7:40 AM 11/20/2023   12:43 AM 11/19/2023    3:01 PM  CBC  WBC 4.0 - 10.5 K/uL 2.8  2.4  2.0   Hemoglobin 12.0 - 15.0 g/dL 89.3  9.4  8.8   Hematocrit 36.0 - 46.0 % 32.3  28.2  26.1   Platelets 150 - 400 K/uL 71  55  55      11/20/2023 12:30 PM  Alan RAMAN Ditton  has presented today for surgery, with the diagnosis of rectal bleeding, cirrhosis.  The various methods of treatment have been discussed with the patient and family. After consideration of risks, benefits and other options for treatment, the patient has consented to  Procedure(s): SIGMOIDOSCOPY, FLEXIBLE (N/A) as a surgical intervention.  The patient's history has been reviewed, patient examined, no change in status, stable for surgery.  I have reviewed the patient's chart and labs.  Questions were answered to the patient's satisfaction.     Gordy HERO Camron Essman

## 2023-11-20 NOTE — Procedures (Addendum)
 I spoke to the patient's husband Laurie Sutton after the procedure. We went over the findings and treatment plan Hemorrhoidal bleeding without evidence of rectal varices, colitis or other complicating factor. Time provided for questions and answers and he thanked me for the call

## 2023-11-20 NOTE — Progress Notes (Signed)
 Trying to initiate enema as ordered by patient has low BP. 83/46 (55) and 95/36 (54). Patient is asymptomatic. Paged Dr. Von. Ordered to give the scheduled midodrine  much earlier and to give IV bolus. Will continue to monitor pt and will give enema as soon as BP is stabilized. Patient educated.

## 2023-11-20 NOTE — Anesthesia Preprocedure Evaluation (Addendum)
 Anesthesia Evaluation  Patient identified by MRN, date of birth, ID band Patient awake    Reviewed: Allergy & Precautions, NPO status , Patient's Chart, lab work & pertinent test results  History of Anesthesia Complications Negative for: history of anesthetic complications  Airway Mallampati: II  TM Distance: >3 FB Neck ROM: Full    Dental  (+) Dental Advisory Given, Missing,    Pulmonary neg pulmonary ROS   Pulmonary exam normal        Cardiovascular negative cardio ROS Normal cardiovascular exam     Neuro/Psych  Headaches PSYCHIATRIC DISORDERS Anxiety Depression     Neuromuscular disease    GI/Hepatic ,GERD  Controlled and Medicated,,(+) Cirrhosis         Endo/Other  diabetes, Type 2, Insulin  Dependent   Obesity K 3.2   Renal/GU negative Renal ROS     Musculoskeletal negative musculoskeletal ROS (+)    Abdominal   Peds  (+) ADHD Hematology  (+) Blood dyscrasia, anemia  Chronic ITP Splenomegaly Pancytopenia - WBC 2.8k, Hb 10.6, Plt 71k INR 1.7    Anesthesia Other Findings   Reproductive/Obstetrics  s/p hysterectomy                               Anesthesia Physical Anesthesia Plan  ASA: 3  Anesthesia Plan: MAC   Post-op Pain Management:    Induction:   PONV Risk Score and Plan: 2 and Propofol  infusion and Treatment may vary due to age or medical condition  Airway Management Planned: Nasal Cannula and Natural Airway  Additional Equipment: None  Intra-op Plan:   Post-operative Plan:   Informed Consent: I have reviewed the patients History and Physical, chart, labs and discussed the procedure including the risks, benefits and alternatives for the proposed anesthesia with the patient or authorized representative who has indicated his/her understanding and acceptance.       Plan Discussed with: CRNA and Anesthesiologist  Anesthesia Plan Comments:           Anesthesia Quick Evaluation

## 2023-11-20 NOTE — Progress Notes (Signed)
 PT Cancellation Note  Patient Details Name: TABBATHA BORDELON MRN: [id number] DOB: [birth year]   Cancelled Treatment:     Pt is off the floor at a procedure;   Will follow up later today as time allows;  Otherwise, will follow up for PT tomorrow;   Thank you,  Silvano Currier, PT  Acute Rehabilitation Services Office [phone]    Silvano VEAR Currier 11/20/2023, 12:21 PM

## 2023-11-21 DIAGNOSIS — D696 Thrombocytopenia, unspecified: Secondary | ICD-10-CM

## 2023-11-21 DIAGNOSIS — K625 Hemorrhage of anus and rectum: Secondary | ICD-10-CM | POA: Diagnosis not present

## 2023-11-21 DIAGNOSIS — K649 Unspecified hemorrhoids: Secondary | ICD-10-CM

## 2023-11-21 LAB — COMPREHENSIVE METABOLIC PANEL WITH GFR
ALT: 19 U/L (ref 0–44)
AST: 33 U/L (ref 15–41)
Albumin: 3.1 g/dL — ABNORMAL LOW (ref 3.5–5.0)
Alkaline Phosphatase: 63 U/L (ref 38–126)
Anion gap: 8 (ref 5–15)
BUN: <5 mg/dL — ABNORMAL LOW (ref 6–20)
CO2: 20 mmol/L — ABNORMAL LOW (ref 22–32)
Calcium: 8.5 mg/dL — ABNORMAL LOW (ref 8.9–10.3)
Chloride: 106 mmol/L (ref 98–111)
Creatinine, Ser: 0.58 mg/dL (ref 0.44–1.00)
GFR, Estimated: >60 mL/min (ref >60)
Glucose, Bld: 143 mg/dL — ABNORMAL HIGH (ref 70–99)
Potassium: 3.9 mmol/L (ref 3.5–5.1)
Sodium: 134 mmol/L — ABNORMAL LOW (ref 135–145)
Total Bilirubin: 4.2 mg/dL — ABNORMAL HIGH (ref 0.0–1.2)
Total Protein: 6 g/dL — ABNORMAL LOW (ref 6.5–8.1)

## 2023-11-21 LAB — CBC WITH DIFFERENTIAL/PLATELET
Abs Granulocyte: 1.3 K/uL — ABNORMAL LOW (ref 1.5–6.5)
Abs Immature Granulocytes: 0.01 K/uL (ref 0.00–0.07)
Basophils Absolute: 0 K/uL (ref 0.0–0.1)
Basophils Relative: 1 %
Eosinophils Absolute: 0.2 K/uL (ref 0.0–0.5)
Eosinophils Relative: 7 %
HCT: 28.5 % — ABNORMAL LOW (ref 36.0–46.0)
Hemoglobin: 9.4 g/dL — ABNORMAL LOW (ref 12.0–15.0)
Immature Granulocytes: 1 %
Lymphocytes Relative: 24 %
Lymphs Abs: 0.5 K/uL — ABNORMAL LOW (ref 0.7–4.0)
MCH: 31 pg (ref 26.0–34.0)
MCHC: 33 g/dL (ref 30.0–36.0)
MCV: 94.1 fL (ref 80.0–100.0)
Monocytes Absolute: 0.2 K/uL (ref 0.1–1.0)
Monocytes Relative: 10 %
Neutro Abs: 1.3 K/uL — ABNORMAL LOW (ref 1.7–7.7)
Neutrophils Relative %: 57 %
Platelets: 57 K/uL — ABNORMAL LOW (ref 150–400)
RBC: 3.03 MIL/uL — ABNORMAL LOW (ref 3.87–5.11)
RDW: 14.9 % (ref 11.5–15.5)
WBC: 2.2 K/uL — ABNORMAL LOW (ref 4.0–10.5)
nRBC: 0 % (ref 0.0–0.2)

## 2023-11-21 LAB — GLUCOSE, CAPILLARY
Glucose-Capillary: 135 mg/dL — ABNORMAL HIGH (ref 70–99)
Glucose-Capillary: 196 mg/dL — ABNORMAL HIGH (ref 70–99)
Glucose-Capillary: 171 mg/dL — ABNORMAL HIGH (ref 70–99)
Glucose-Capillary: 175 mg/dL — ABNORMAL HIGH (ref 70–99)

## 2023-11-21 LAB — CYTOLOGY - NON PAP

## 2023-11-21 LAB — MAGNESIUM: Magnesium: 1.7 mg/dL (ref 1.7–2.4)

## 2023-11-21 LAB — PROTIME-INR
INR: 1.8 — ABNORMAL HIGH (ref 0.8–1.2)
Prothrombin Time: 22.1 s — ABNORMAL HIGH (ref 11.4–15.2)

## 2023-11-21 LAB — CK: Total CK: 44 U/L (ref 38–234)

## 2023-11-21 MED ORDER — ALBUTEROL SULFATE (2.5 MG/3ML) 0.083% IN NEBU: 2.5000 mg | INHALATION_SOLUTION | Freq: Four times a day (QID) | RESPIRATORY_TRACT | Status: DC | PRN

## 2023-11-21 MED ORDER — PANTOPRAZOLE SODIUM 40 MG PO TBEC
40.0000 mg | DELAYED_RELEASE_TABLET | Freq: Every day | ORAL | Status: DC
  Administered 2023-11-22 (×2): 40 mg via ORAL
  Filled 2023-11-21: qty 1

## 2023-11-21 MED ORDER — FLUOXETINE HCL 20 MG PO CAPS
40.0000 mg | ORAL_CAPSULE | Freq: Two times a day (BID) | ORAL | Status: DC
  Administered 2023-11-21 – 2023-11-22 (×4): 40 mg via ORAL
  Filled 2023-11-21 (×2): qty 2

## 2023-11-21 MED ORDER — DAPTOMYCIN-SODIUM CHLORIDE 500-0.9 MG/50ML-% IV SOLN
6.0000 mg/kg | Freq: Every day | INTRAVENOUS | Status: DC
  Administered 2023-11-21 (×2): 500 mg via INTRAVENOUS
  Filled 2023-11-21 (×2): qty 50

## 2023-11-21 MED ORDER — ALPRAZOLAM 0.5 MG PO TABS
1.0000 mg | ORAL_TABLET | Freq: Once | ORAL | Status: AC
  Administered 2023-11-21 (×2): 1 mg via ORAL
  Filled 2023-11-21: qty 2

## 2023-11-21 NOTE — Progress Notes (Signed)
 PT Cancellation Note and Discharge   Patient Details Name: Laurie Sutton MRN: [id number] DOB: Apr 24, 1980   Cancelled Treatment:    Reason Eval/Treat Not Completed: Patient declined, no reason specified (Spoke with patient at bedside. She declined PT needs at this time. She reports getting up in the room independently and declined walking in hallway. PT will sign off at this time.)  Laurie Sutton, PT, MPT  Laurie Sutton 11/21/2023, 10:20 AM

## 2023-11-21 NOTE — Plan of Care (Signed)

## 2023-11-21 NOTE — Progress Notes (Signed)
 PROGRESS NOTE                                                                                                                                                                                                             Patient Demographics:    Laurie Sutton, is a 43 y.o. female, DOB - 1980/07/08, FMW:[id number]  Outpatient Primary MD for the patient is [name], MD    LOS - 3  Admit date - 11/18/2023    Chief Complaint  Patient presents with   Rectal Bleeding       Brief Narrative (HPI from H&P)   43 y.o. female with past medical history  of allergy to penicillin causing hives and itching and angioedema with a rash facial tongue throat swelling shortness of breath and lightheadedness with dizziness and hypotension, also allergic to vancomycin  amoxicillin azithromycin  dulaglutide , Januvia , metformin , Jardiance , latex, migraine, GERD, depression with anxiety, type 2 diabetes, decompensated liver cirrhosis secondary to NASH , splenic varices esophageal varices portal colopathy  esophageal varices portal hypertensive gastropathy, frequent UTI, hypertension, C. difficile colitis in June 2005 with a negative retest for C. difficile in July 2005.    Patient is being admitted today for rectal bleeding that started yesterday patient states that she does feel dizzy and has some weakness but no other than the abdominal distention and discomfort in the rectal bleeding she feels okay.  Patient is following up with hepatology clinic with [facility] where she is also being worked up for possible liver transplant.:    Subjective:   Patient in bed, appears comfortable, denies any headache, no fever, no chest pain or pressure, no shortness of breath , no abdominal pain. No focal weakness.  He is anxious.   Assessment  & Plan :   Bright red blood per rectum.  With some dizziness, fortunately H&H is stable, type screen done, on IV PPI,  likely lower GI bleed question if hemorrhoidal, GI on board.  Due for sigmoidoscopy on 11/20/2023, CBC is actually stable and gradually rising, continue to monitor.  NASH with cirrhosis, recurrent ascites and pancytopenia.  Supportive care, had ultrasound-guided paracentesis on 11/19/2023 with 6 L of fluid removed, no signs of SBP on the fluid but fluid culture growing gram-positive cocci in anaerobic bottle, will discuss with ID, for now trial of antibiotics, no abdominal pain,  final cultures pending.  Monitor with supportive care, INR is supratherapeutic but stable.   Chronic asymptomatic hypotension.  Supportive care.    Hypokalemia.  Replaced.  History of C. difficile.  Currently constipated, clinically resolved.  DM type II.  On diet control.      Condition - Extremely Guarded  Family Communication  : Husband Juliene [phone]  over the phone on 11/19/2023 and 11/20/2023, husband and family bedside on 11/21/2023  Code Status : Full code  Consults  : [facility], IR, ID over the phone  PUD Prophylaxis : PPI   Procedures  :     US  guided paracentesis requested 11/19/2023.  CT Hepatic cirrhosis with moderate splenomegaly and moderate ascites and left upper quadrant varices suggesting portal hypertension. Minimal cholelithiasis      Disposition Plan  :    Status is: Inpatient   DVT Prophylaxis  :    Place TED hose Start: 11/19/23 0826 SCDs Start: 11/18/23 1601    Lab Results  Component Value Date   PLT 57 (L) 11/21/2023    Diet :  Diet Order             Diet 2 gram sodium Fluid consistency: Thin  Diet effective now                    Inpatient Medications  Scheduled Meds:  FLUoxetine   40 mg Oral Daily   hydrocortisone   25 mg Rectal QHS   hydrocortisone -pramoxine   Rectal TID   insulin  aspart  0-6 Units Subcutaneous TID AC & HS   lactulose   20 g Oral TID   linezolid   600 mg Oral Q12H   midodrine   10 mg Oral TID WC   montelukast   10 mg Oral QHS    pantoprazole  (PROTONIX ) IV  40 mg Intravenous Q12H   Continuous Infusions:   PRN Meds:.acetaminophen  **OR** acetaminophen , albuterol , hydrALAZINE , morphine  injection, ondansetron  **OR** ondansetron  (ZOFRAN ) IV  Antibiotics  :    Anti-infectives (From admission, onward)    Start     Dose/Rate Route Frequency Ordered Stop   11/20/23 1715  linezolid  (ZYVOX ) tablet 600 mg        600 mg Oral Every 12 hours 11/20/23 1624           Objective:   Vitals:   11/20/23 2020 11/20/23 2345 11/21/23 0419 11/21/23 0941  BP: 108/60 (!) 98/54 (!) 94/48 (!) 99/45  Pulse: 67   69  Resp: 18 20 20  (!) 28  Temp: 98 F (36.7 C) 98.6 F (37 C) 98 F (36.7 C) 97.8 F (36.6 C)  TempSrc: Oral Oral Oral Oral  SpO2: 95%   99%  Weight:      Height:        Wt Readings from Last 3 Encounters:  11/18/23 89.4 kg  10/28/23 89.4 kg  10/18/23 89.4 kg     Intake/Output Summary (Last 24 hours) at 11/21/2023 1029 Last data filed at 11/20/2023 1350 Gross per 24 hour  Intake 100 ml  Output --  Net 100 ml      Physical Exam  Awake Alert, No new F.N deficits, anxious affect [place].AT,PERRAL Supple Neck, No JVD,   Symmetrical Chest wall movement, Good air movement bilaterally, CTAB RRR,No Gallops,Rubs or new Murmurs,  +ve B.Sounds, Abd distended but nontender, has ascites moderate to severe,  No Cyanosis, Clubbing or edema      Data Review:    Recent Labs  Lab 11/19/23 0602 11/19/23 1501 11/20/23 0043 11/20/23  0740 11/21/23 0616  WBC 2.4* 2.0* 2.4* 2.8* 2.2*  HGB 9.9* 8.8* 9.4* 10.6* 9.4*  HCT 29.9* 26.1* 28.2* 32.3* 28.5*  PLT 60* 55* 55* 71* 57*  MCV 93.1 92.6 92.8 93.9 94.1  MCH 30.8 31.2 30.9 30.8 31.0  MCHC 33.1 33.7 33.3 32.8 33.0  RDW 14.8 14.7 14.8 14.7 14.9  LYMPHSABS 0.5* 0.4* 0.5* 0.6* 0.5*  MONOABS 0.3 0.3 0.4 0.3 0.2  EOSABS 0.2 0.1 0.1 0.2 0.2  BASOSABS 0.0 0.0 0.0 0.0 0.0    Recent Labs  Lab 11/18/23 1135 11/18/23 1528 11/18/23 1616 11/18/23 1941 11/19/23 0602  11/20/23 0740 11/21/23 0616  NA 132*  --   --  132* 133* 134* 134*  K 2.7*  --   --  2.9* 3.1* 3.2* 3.9  CL 96*  --   --  96* 100 101 106  CO2 23  --   --  27 24 23  20*  ANIONGAP 13  --   --  9 9 10 8   GLUCOSE 266*  --   --  209* 196* 175* 143*  BUN 5*  --   --  <5* <5* <5* <5*  CREATININE 0.75  --   --  0.69 0.73 0.70 0.58  AST 39  --   --   --  34 38 33  ALT 25  --   --   --  23 22 19   ALKPHOS 94  --   --   --  82 79 63  BILITOT 3.8*  --   --   --  3.4* 3.9* 4.2*  ALBUMIN  2.7*  --   --   --  2.5* 3.1* 3.1*  INR  --   --  1.7*  --   --  1.7* 1.8*  MG  --  1.6*  --   --  2.0 1.8 1.7  PHOS  --   --   --   --  2.9  --   --   CALCIUM 8.2*  --   --  8.2* 8.1* 8.6* 8.5*      Recent Labs  Lab 11/18/23 1135 11/18/23 1528 11/18/23 1616 11/18/23 1941 11/19/23 0602 11/20/23 0740 11/21/23 0616  INR  --   --  1.7*  --   --  1.7* 1.8*  MG  --  1.6*  --   --  2.0 1.8 1.7  CALCIUM 8.2*  --   --  8.2* 8.1* 8.6* 8.5*    --------------------------------------------------------------------------------------------------------------- Lab Results  Component Value Date   CHOL 169 08/19/2017   HDL 38 08/19/2017   LDLCALC 91 08/19/2017   TRIG 202 (A) 08/19/2017   CHOLHDL 4 05/12/2015    Lab Results  Component Value Date   HGBA1C 6.1 (H) 10/05/2023   No results for input(s): TSH, T4TOTAL, FREET4, T3FREE, THYROIDAB in the last 72 hours. No results for input(s): VITAMINB12, FOLATE, FERRITIN, TIBC, IRON, RETICCTPCT in the last 72 hours. ------------------------------------------------------------------------------------------------------------------ Cardiac Enzymes No results for input(s): CKMB, TROPONINI, MYOGLOBIN in the last 168 hours.  Invalid input(s): CK  Micro Results Recent Results (from the past 240 hours)  MRSA Next Gen by PCR, Nasal     Status: None   Collection Time: 11/18/23 11:49 PM   Specimen: Nasal Mucosa; Nasal Swab  Result Value Ref  Range Status   MRSA by PCR Next Gen NOT DETECTED NOT DETECTED Final    Comment: (NOTE) The GeneXpert MRSA Assay (FDA approved for NASAL specimens only), is one component of a comprehensive  MRSA colonization surveillance program. It is not intended to diagnose MRSA infection nor to guide or monitor treatment for MRSA infections. Test performance is not FDA approved in patients less than 17 years old. Performed at [facility], [address], [place], [place] [address]   C Difficile Quick Screen w PCR reflex     Status: None   Collection Time: 11/19/23 12:19 AM   Specimen: STOOL  Result Value Ref Range Status   C Diff antigen NEGATIVE NEGATIVE Final   C Diff toxin NEGATIVE NEGATIVE Final   C Diff interpretation No C. difficile detected.  Final    Comment: Performed at [facility], [address] [place], [place] [address]  Gastrointestinal Panel by PCR , Stool     Status: None   Collection Time: 11/19/23 12:19 AM   Specimen: Stool  Result Value Ref Range Status   Campylobacter species NOT DETECTED NOT DETECTED Final   Plesimonas shigelloides NOT DETECTED NOT DETECTED Final   Salmonella species NOT DETECTED NOT DETECTED Final   Yersinia enterocolitica NOT DETECTED NOT DETECTED Final   Vibrio species NOT DETECTED NOT DETECTED Final   Vibrio cholerae NOT DETECTED NOT DETECTED Final   Enteroaggregative E coli (EAEC) NOT DETECTED NOT DETECTED Final   Enteropathogenic E coli (EPEC) NOT DETECTED NOT DETECTED Final   Enterotoxigenic E coli (ETEC) NOT DETECTED NOT DETECTED Final   Shiga like toxin producing E coli (STEC) NOT DETECTED NOT DETECTED Final   Shigella/Enteroinvasive E coli (EIEC) NOT DETECTED NOT DETECTED Final   Cryptosporidium NOT DETECTED NOT DETECTED Final   Cyclospora cayetanensis NOT DETECTED NOT DETECTED Final   Entamoeba histolytica NOT DETECTED NOT DETECTED Final   Giardia lamblia NOT DETECTED NOT DETECTED Final   Adenovirus F40/41 NOT DETECTED NOT  DETECTED Final   Astrovirus NOT DETECTED NOT DETECTED Final   Norovirus GI/GII NOT DETECTED NOT DETECTED Final   Rotavirus A NOT DETECTED NOT DETECTED Final   Sapovirus (I, II, IV, and V) NOT DETECTED NOT DETECTED Final    Comment: Performed at [facility], [address] [place], [place] [address]  Culture, body fluid w Gram Stain-bottle     Status: None (Preliminary result)   Collection Time: 11/19/23 11:27 AM   Specimen: Peritoneal Washings  Result Value Ref Range Status   Specimen Description PERITONEAL  Final   Special Requests ABDOMEN  Final   Gram Stain   Final    GRAM POSITIVE COCCI IN CLUSTERS ANAEROBIC BOTTLE ONLY CRITICAL RESULT CALLED TO, READ BACK BY AND VERIFIED WITH: RN SONNY POUR [phone] [id number] FCP Performed at [facility], [address] [place], [place] [address]    Culture GRAM POSITIVE COCCI  Final   Report Status PENDING  Incomplete  Gram stain     Status: None   Collection Time: 11/19/23 11:27 AM   Specimen: Peritoneal Washings  Result Value Ref Range Status   Specimen Description PERITONEAL  Final   Special Requests ABDOMEN  Final   Gram Stain   Final    RARE WBC PRESENT, PREDOMINANTLY PMN NO ORGANISMS SEEN Performed at [facility], [address] [place], [place] [address]    Report Status 11/19/2023 FINAL  Final    Radiology Report US  Paracentesis Result Date: 11/19/2023 INDICATION: 43 year old female with history of NASH cirrhosis column with recurrent ascites. IR was requested for diagnostic and therapeutic paracentesis. 6 L maximum. EXAM: ULTRASOUND GUIDED DIAGNOSTIC AND THERAPEUTIC PARACENTESIS MEDICATIONS: 8 cc of  1% lidocaine  COMPLICATIONS: None immediate. PROCEDURE: Informed written consent was obtained from the patient after a discussion of the risks, benefits and alternatives to treatment. A timeout was performed prior to the initiation of the procedure. Initial ultrasound scanning demonstrates a large amount of ascites  within the right lower abdominal quadrant. The right lower abdomen was prepped and draped in the usual sterile fashion. 1% lidocaine  was used for local anesthesia. Following this, a 19 gauge, 7-cm, Yueh catheter was introduced. An ultrasound image was saved for documentation purposes. The paracentesis was performed. The catheter was removed and a dressing was applied. The patient tolerated the procedure well without immediate post procedural complication. Patient received post-procedure intravenous albumin ; see nursing notes for details. FINDINGS: A total of approximately 6 L of clear, straw-colored peritoneal fluid was removed. Samples were sent to the laboratory as requested by the clinical team. IMPRESSION: Successful ultrasound-guided paracentesis yielding 6 liters of peritoneal fluid. Procedure performed by Carlin Griffon, PA-C Electronically Signed   By: Juliene Balder M.D.   On: 11/19/2023 20:28     Signature  -   Lavada Stank M.D on 11/21/2023 at 10:29 AM   -  To page go to [email or web address]

## 2023-11-21 NOTE — Consult Note (Signed)
 [facility]  Total days of antibiotics 2 linezolid        Reason for Consult:spontaneous bacterial peritonitis vs. Contaminant of peritoneal fluid culture    Referring Physician: singh  Principal Problem:   GIB (gastrointestinal bleeding) Active Problems:   Decompensated cirrhosis (HCC)   Internal hemorrhoid, bleeding    HPI: Laurie Sutton is a 43 y.o. female with history of  NASH -decompensated cirrhosis, with hx of hepatic encephalopathy, portal colopathy and recurrent ascites, with increasing needs for recurrent paracentesis Q3-4 wk who is followed by Dr Legrand and Stephane Hence at [facility] was admitted on 11/18/23 for rectal bleeding but also describes having intermittent abdominal pain. Last paracentesis on 10/29/23-- TNC of 270 with 34%N. She underwent repeat paracentesis on 8/9 found to have TNC of 112 and only 5%N. Gram stain showing GPCs. SAAG ( <1.5 alb) She was started on linezolid .   She had hx of cdiff and campylobacter enteritis in June 25.   She underwent flex sig on 8/10 which showed internal hemorrhoids causing source of bleeding.  She is no longer having blood in her stool, this morning some nausea with eating and still some mild abdominal tenderness  Past Medical History:  Diagnosis Date   ADHD    Allergy    Anemia    IRON TRANSFUSION 07-2020 NONE SINCE   Anxiety    Back pain    COVID 08/12/2019   ALL SYMPTOMS REOLVED PER PT   Depression    Diabetic neuropathy (HCC) 11/11/2020   FEET   dm type 2    sees Dr. Odella Jacobson at [facility]   Fatty liver    GERD (gastroesophageal reflux disease)    History of kidney stones    Joint pain    Menorrhagia 11/11/2020   Migraines    Murmur, cardiac    FAINT NO CARDIOLOGIST   Other fatigue    Pneumonia 08/12/2019   COVID PNEUMONIA ALL SYMPTOMS RESOLVED PER PT   Shortness of breath on exertion     Allergies:  Allergies  Allergen Reactions   Vancomycin  Itching    Amoxicillin Hives and Itching   Azithromycin      DOES NOT WORK   Dulaglutide  Other (See Comments)    constipation and stomach issues  **Trulicity **    Empagliflozin -Metformin  Hcl Er Other (See Comments) and Swelling   Januvia  [Sitagliptin ] Other (See Comments)    HURTS STOAMCH   Latex     IRRITATES VAGINAL AREA   Metformin      Other Reaction(s): achy all over   Penicillins Hives    Has patient had a PCN reaction causing immediate rash, facial/tongue/throat swelling, SOB or lightheadedness with hypotension: yes. Rash  Has patient had a PCN reaction causing severe rash involving mucus membranes or skin necrosis: Yes- rash and hives all over body  Has patient had a PCN reaction that required hospitalization No Has patient had a PCN reaction occurring within the last 10 years: Yes  If all of the above answers are NO, then may proceed with Cephalosporin use.    Vitamin K  And Related Other (See Comments)    Electric shock      MEDICATIONS:  FLUoxetine   40 mg Oral BID   hydrocortisone   25 mg Rectal QHS   hydrocortisone -pramoxine   Rectal TID   insulin  aspart  0-6 Units Subcutaneous TID AC & HS   lactulose   20 g Oral TID   midodrine   10 mg Oral TID WC  montelukast   10 mg Oral QHS   [START ON 11/22/2023] pantoprazole   40 mg Oral Daily    Social History   Tobacco Use   Smoking status: Never    Passive exposure: Current   Smokeless tobacco: Never  Vaping Use   Vaping status: Never Used  Substance Use Topics   Alcohol use: No    Alcohol/week: 0.0 standard drinks of alcohol   Drug use: No    Family History  Problem Relation Age of Onset   Colon polyps Mother    Depression Mother    Anxiety disorder Mother    Colon polyps Father    Sleep apnea Father    Anxiety disorder Father    Depression Father    Diabetes Father    Bladder Cancer Father 84   Rheum arthritis Father    Migraines Maternal Aunt    Migraines Maternal Grandmother    Diabetes Maternal Grandfather     Healthy Daughter    Asthma Daughter    Anxiety disorder Daughter    Anxiety disorder Son    Asthma Son    Healthy Son    Cerebral aneurysm Cousin    Heart disease Other    Depression Other    Anxiety disorder Other    Sleep apnea Other    Colon cancer Neg Hx    Esophageal cancer Neg Hx    Stomach cancer Neg Hx    Rectal cancer Neg Hx     Review of Systems - see above. Otherwise 12 point ros is negative   OBJECTIVE: Temp:  [97.5 F (36.4 C)-98.6 F (37 C)] 97.8 F (36.6 C) (08/11 0941) Pulse Rate:  [61-69] 69 (08/11 0941) Resp:  [16-28] 28 (08/11 0941) BP: (81-115)/(45-72) 99/45 (08/11 0941) SpO2:  [95 %-99 %] 99 % (08/11 0941) Physical Exam  Constitutional:  oriented to person, place, and time. appears well-developed and well-nourished. No distress.  HENT: [place]/AT, PERRLA, no scleral icterus Mouth/Throat: Oropharynx is clear and moist. No oropharyngeal exudate.  Cardiovascular: Normal rate, regular rhythm and normal heart sounds. Exam reveals no gallop and no friction rub.  No murmur heard.  Pulmonary/Chest: Effort normal and breath sounds normal. No respiratory distress.  has no wheezes.  Neck = supple, no nuchal rigidity Abdominal: Soft. Bowel sounds are normal. Protuberant abdomen, with mild tenderness.  Lymphadenopathy: no cervical adenopathy. No axillary adenopathy Neurological: alert and oriented to person, place, and time.  Skin: Skin is warm and dry. No rash noted. No erythema.  Psychiatric: a normal mood and affect.  behavior is normal.    LABS: Results for orders placed or performed during the hospital encounter of 11/18/23 (from the past 48 hours)  CBC with Differential/Platelet     Status: Abnormal   Collection Time: 11/19/23  3:01 PM  Result Value Ref Range   WBC 2.0 (L) 4.0 - 10.5 K/uL   RBC 2.82 (L) 3.87 - 5.11 MIL/uL   Hemoglobin 8.8 (L) 12.0 - 15.0 g/dL   HCT 73.8 (L) 63.9 - 53.9 %   MCV 92.6 80.0 - 100.0 fL   MCH 31.2 26.0 - 34.0 pg   MCHC 33.7  30.0 - 36.0 g/dL   RDW 85.2 88.4 - 84.4 %   Platelets 55 (L) 150 - 400 K/uL    Comment: SPECIMEN CHECKED FOR CLOTS REPEATED TO VERIFY CONSISTENT WITH PREVIOUS RESULT Immature Platelet Fraction may be clinically indicated, consider ordering this additional test [id number]    nRBC 0.0 0.0 - 0.2 %   Neutrophils  Relative % 55 %   Neutro Abs 1.1 (L) 1.7 - 7.7 K/uL   Lymphocytes Relative 22 %   Lymphs Abs 0.4 (L) 0.7 - 4.0 K/uL   Monocytes Relative 15 %   Monocytes Absolute 0.3 0.1 - 1.0 K/uL   Eosinophils Relative 6 %   Eosinophils Absolute 0.1 0.0 - 0.5 K/uL   Basophils Relative 1 %   Basophils Absolute 0.0 0.0 - 0.1 K/uL   Immature Granulocytes 1 %   Abs Immature Granulocytes 0.01 0.00 - 0.07 K/uL   Abs Granulocyte 1.1 (L) 1.5 - 6.5 K/uL    Comment: Performed at [facility], [address], [place], [place] [address]  Glucose, capillary     Status: Abnormal   Collection Time: 11/19/23  5:48 PM  Result Value Ref Range   Glucose-Capillary 187 (H) 70 - 99 mg/dL    Comment: Glucose reference range applies only to samples taken after fasting for at least 8 hours.  Glucose, capillary     Status: Abnormal   Collection Time: 11/19/23  9:06 PM  Result Value Ref Range   Glucose-Capillary 203 (H) 70 - 99 mg/dL    Comment: Glucose reference range applies only to samples taken after fasting for at least 8 hours.  CBC with Differential/Platelet     Status: Abnormal   Collection Time: 11/20/23 12:43 AM  Result Value Ref Range   WBC 2.4 (L) 4.0 - 10.5 K/uL   RBC 3.04 (L) 3.87 - 5.11 MIL/uL   Hemoglobin 9.4 (L) 12.0 - 15.0 g/dL   HCT 71.7 (L) 63.9 - 53.9 %   MCV 92.8 80.0 - 100.0 fL   MCH 30.9 26.0 - 34.0 pg   MCHC 33.3 30.0 - 36.0 g/dL   RDW 85.1 88.4 - 84.4 %   Platelets 55 (L) 150 - 400 K/uL    Comment: SPECIMEN CHECKED FOR CLOTS REPEATED TO VERIFY CONSISTENT WITH PREVIOUS RESULT Immature Platelet Fraction may be clinically indicated, consider ordering this additional  test [id number]    nRBC 0.0 0.0 - 0.2 %   Neutrophils Relative % 59 %   Neutro Abs 1.5 (L) 1.7 - 7.7 K/uL   Lymphocytes Relative 20 %   Lymphs Abs 0.5 (L) 0.7 - 4.0 K/uL   Monocytes Relative 15 %   Monocytes Absolute 0.4 0.1 - 1.0 K/uL   Eosinophils Relative 5 %   Eosinophils Absolute 0.1 0.0 - 0.5 K/uL   Basophils Relative 1 %   Basophils Absolute 0.0 0.0 - 0.1 K/uL   Immature Granulocytes 0 %   Abs Immature Granulocytes 0.00 0.00 - 0.07 K/uL    Comment: Performed at [facility], [address], [place], [place] [address]  CBC with Differential/Platelet     Status: Abnormal   Collection Time: 11/20/23  7:40 AM  Result Value Ref Range   WBC 2.8 (L) 4.0 - 10.5 K/uL   RBC 3.44 (L) 3.87 - 5.11 MIL/uL   Hemoglobin 10.6 (L) 12.0 - 15.0 g/dL   HCT 67.6 (L) 63.9 - 53.9 %   MCV 93.9 80.0 - 100.0 fL   MCH 30.8 26.0 - 34.0 pg   MCHC 32.8 30.0 - 36.0 g/dL   RDW 85.2 88.4 - 84.4 %   Platelets 71 (L) 150 - 400 K/uL    Comment: REPEATED TO VERIFY Immature Platelet Fraction may be clinically indicated, consider ordering this additional test [id number]    nRBC 0.0 0.0 - 0.2 %   Neutrophils Relative %  59 %   Neutro Abs 1.7 1.7 - 7.7 K/uL   Lymphocytes Relative 23 %   Lymphs Abs 0.6 (L) 0.7 - 4.0 K/uL   Monocytes Relative 11 %   Monocytes Absolute 0.3 0.1 - 1.0 K/uL   Eosinophils Relative 5 %   Eosinophils Absolute 0.2 0.0 - 0.5 K/uL   Basophils Relative 1 %   Basophils Absolute 0.0 0.0 - 0.1 K/uL   Immature Granulocytes 1 %   Abs Immature Granulocytes 0.04 0.00 - 0.07 K/uL    Comment: Performed at [facility], [address] [place], [place] [address]  Magnesium      Status: None   Collection Time: 11/20/23  7:40 AM  Result Value Ref Range   Magnesium  1.8 1.7 - 2.4 mg/dL    Comment: Performed at [facility], [address] [place], [place] [address]  Comprehensive metabolic panel with GFR     Status: Abnormal   Collection Time: 11/20/23  7:40 AM  Result Value  Ref Range   Sodium 134 (L) 135 - 145 mmol/L   Potassium 3.2 (L) 3.5 - 5.1 mmol/L   Chloride 101 98 - 111 mmol/L   CO2 23 22 - 32 mmol/L   Glucose, Bld 175 (H) 70 - 99 mg/dL    Comment: Glucose reference range applies only to samples taken after fasting for at least 8 hours.   BUN <5 (L) 6 - 20 mg/dL   Creatinine, Ser 9.29 0.44 - 1.00 mg/dL   Calcium 8.6 (L) 8.9 - 10.3 mg/dL   Total Protein 6.7 6.5 - 8.1 g/dL   Albumin  3.1 (L) 3.5 - 5.0 g/dL   AST 38 15 - 41 U/L   ALT 22 0 - 44 U/L   Alkaline Phosphatase 79 38 - 126 U/L   Total Bilirubin 3.9 (H) 0.0 - 1.2 mg/dL   GFR, Estimated >39 >39 mL/min    Comment: (NOTE) Calculated using the CKD-EPI Creatinine Equation (2021)    Anion gap 10 5 - 15    Comment: Performed at [facility], [address], [place], [place] [address]  Protime-INR     Status: Abnormal   Collection Time: 11/20/23  7:40 AM  Result Value Ref Range   Prothrombin Time 20.9 (H) 11.4 - 15.2 seconds   INR 1.7 (H) 0.8 - 1.2    Comment: (NOTE) INR goal varies based on device and disease states. Performed at [facility], [address], [place], [place] [address]   Glucose, capillary     Status: Abnormal   Collection Time: 11/20/23  8:05 AM  Result Value Ref Range   Glucose-Capillary 173 (H) 70 - 99 mg/dL    Comment: Glucose reference range applies only to samples taken after fasting for at least 8 hours.  Glucose, capillary     Status: Abnormal   Collection Time: 11/20/23 12:37 PM  Result Value Ref Range   Glucose-Capillary 150 (H) 70 - 99 mg/dL    Comment: Glucose reference range applies only to samples taken after fasting for at least 8 hours.  Glucose, capillary     Status: Abnormal   Collection Time: 11/20/23  5:14 PM  Result Value Ref Range   Glucose-Capillary 183 (H) 70 - 99 mg/dL    Comment: Glucose reference range applies only to samples taken after fasting for at least 8 hours.  Glucose, capillary     Status: Abnormal   Collection Time:  11/20/23  9:30 PM  Result Value Ref Range   Glucose-Capillary 178 (H) 70 - 99 mg/dL    Comment: Glucose reference range applies only to samples taken after fasting for at least 8 hours.  CBC with Differential/Platelet     Status: Abnormal   Collection Time: 11/21/23  6:16 AM  Result Value Ref Range   WBC 2.2 (L) 4.0 - 10.5 K/uL   RBC 3.03 (L) 3.87 - 5.11 MIL/uL   Hemoglobin 9.4 (L) 12.0 - 15.0 g/dL   HCT 71.4 (L) 63.9 - 53.9 %   MCV 94.1 80.0 - 100.0 fL   MCH 31.0 26.0 - 34.0 pg   MCHC 33.0 30.0 - 36.0 g/dL   RDW 85.0 88.4 - 84.4 %   Platelets 57 (L) 150 - 400 K/uL    Comment: REPEATED TO VERIFY Immature Platelet Fraction may be clinically indicated, consider ordering this additional test [id number]    nRBC 0.0 0.0 - 0.2 %   Neutrophils Relative % 57 %   Neutro Abs 1.3 (L) 1.7 - 7.7 K/uL   Lymphocytes Relative 24 %   Lymphs Abs 0.5 (L) 0.7 - 4.0 K/uL   Monocytes Relative 10 %   Monocytes Absolute 0.2 0.1 - 1.0 K/uL   Eosinophils Relative 7 %   Eosinophils Absolute 0.2 0.0 - 0.5 K/uL   Basophils Relative 1 %   Basophils Absolute 0.0 0.0 - 0.1 K/uL   Immature Granulocytes 1 %   Abs Immature Granulocytes 0.01 0.00 - 0.07 K/uL   Abs Granulocyte 1.3 (L) 1.5 - 6.5 K/uL    Comment: Performed at [facility], [address] [place], [place] [address]  Magnesium      Status: None   Collection Time: 11/21/23  6:16 AM  Result Value Ref Range   Magnesium  1.7 1.7 - 2.4 mg/dL    Comment: Performed at [facility], [address], [place], [place] [address]  Comprehensive metabolic panel with GFR     Status: Abnormal   Collection Time: 11/21/23  6:16 AM  Result Value Ref Range   Sodium 134 (L) 135 - 145 mmol/L   Potassium 3.9 3.5 - 5.1 mmol/L   Chloride 106 98 - 111 mmol/L   CO2 20 (L) 22 - 32 mmol/L   Glucose, Bld 143 (H) 70 - 99 mg/dL    Comment: Glucose reference range applies only to samples taken after fasting for at least 8 hours.   BUN <5 (L) 6 - 20 mg/dL    Creatinine, Ser 9.41 0.44 - 1.00 mg/dL   Calcium 8.5 (L) 8.9 - 10.3 mg/dL   Total Protein 6.0 (L) 6.5 - 8.1 g/dL   Albumin  3.1 (L) 3.5 - 5.0 g/dL   AST 33 15 - 41 U/L   ALT 19 0 - 44 U/L   Alkaline Phosphatase 63 38 - 126 U/L   Total Bilirubin 4.2 (H) 0.0 - 1.2 mg/dL   GFR, Estimated >39 >39 mL/min    Comment: (NOTE) Calculated using the CKD-EPI Creatinine Equation (2021)    Anion gap 8 5 - 15    Comment: Performed at [facility], [address], [place], [place] [address]  Protime-INR     Status: Abnormal   Collection Time: 11/21/23  6:16 AM  Result Value Ref Range   Prothrombin Time 22.1 (H) 11.4 - 15.2 seconds   INR 1.8 (H) 0.8 - 1.2    Comment: (NOTE) INR goal varies based on device and disease states. Performed at [facility], [address] [place], [place] [address]   Glucose, capillary     Status: Abnormal   Collection Time: 11/21/23  8:54 AM  Result Value Ref Range   Glucose-Capillary 135 (H) 70 - 99 mg/dL    Comment: Glucose reference range applies only to samples taken after fasting for at least 8 hours.  Glucose, capillary     Status: Abnormal   Collection Time: 11/21/23 10:54 AM  Result Value Ref Range   Glucose-Capillary 196 (H) 70 - 99 mg/dL    Comment: Glucose reference range applies only to samples taken after fasting for at least 8 hours.    MICRO: Gpc on peritoneal fluid IMAGING: No results found.  HISTORICAL MICRO/IMAGING  Assessment/Plan:  43yo F with NASH related cirrhosis hx of recurrent ascites admitted for rectal bleeding found to be 2/2 internal hemorrhoids, had also repeat paracentesis by cell count does not appear to meed criteria for SBP however does have GPC on culture. Currently on linezolid . She has leukopenia and thrombocytopenia due to liver disease.  Recommend to change from linezolid  to daptomycin  for the timebeing Await for results from peritoneal fluid culture.   Thrombocytopenia = appears at her baseline in 50s-70s.  Not necessarily worsened with linezolid -which we plan to stop.  Can remove enteric precautions no longer having cdiff.  evaluation of this patient requires complex antimicrobial therapy evaluation and counseling and isolation needs for disease transmission risk assessment and mitigation.

## 2023-11-21 NOTE — Plan of Care (Signed)
  Problem: Health Behavior/Discharge Planning: Goal: Ability to manage health-related needs will improve Outcome: Progressing   Problem: Clinical Measurements: Goal: Ability to maintain clinical measurements within normal limits will improve Outcome: Progressing Goal: Will remain free from infection Outcome: Progressing Goal: Diagnostic test results will improve Outcome: Progressing   Problem: Nutrition: Goal: Adequate nutrition will be maintained Outcome: Progressing   Problem: Coping: Goal: Level of anxiety will decrease Outcome: Progressing   Problem: Safety: Goal: Ability to remain free from injury will improve Outcome: Progressing

## 2023-11-21 NOTE — Progress Notes (Signed)
 Transition of Care [facility]) - Inpatient Brief Assessment   Patient Details  Name: Laurie Sutton MRN: [id number] Date of Birth: November 10, 1980  [facility] CM/SW Contact:    Robynn Eileen Hoose, RN Phone Number: 11/21/2023, 12:05 PM   Clinical Narrative:  Patient from home with spouse with c/o rectal bleed. Patient is being worked up through [facility] for possible liver transplant. Per PT note patient declined PT eval, ambulates independently. PT signed off.  Transition of Care Asessment: Insurance and Status: (P) Insurance coverage has been reviewed Patient has primary care physician: (P) Yes Home environment has been reviewed: (P) Home with Spouse Prior level of function:: (P) Independent Prior/Current Home Services: (P) No current home services Social Drivers of Health Review: (P) SDOH reviewed no interventions necessary Readmission risk has been reviewed: (P) Yes Transition of care needs: (P) no transition of care needs at this time

## 2023-11-22 ENCOUNTER — Telehealth (HOSPITAL_COMMUNITY): Payer: Self-pay | Admitting: Pharmacy Technician

## 2023-11-22 ENCOUNTER — Other Ambulatory Visit (HOSPITAL_COMMUNITY): Payer: Self-pay

## 2023-11-22 ENCOUNTER — Encounter (HOSPITAL_COMMUNITY): Payer: Self-pay | Admitting: Internal Medicine

## 2023-11-22 DIAGNOSIS — K625 Hemorrhage of anus and rectum: Secondary | ICD-10-CM | POA: Diagnosis not present

## 2023-11-22 LAB — COMPREHENSIVE METABOLIC PANEL WITH GFR
ALT: 20 U/L (ref 0–44)
AST: 36 U/L (ref 15–41)
Albumin: 2.8 g/dL — ABNORMAL LOW (ref 3.5–5.0)
Alkaline Phosphatase: 64 U/L (ref 38–126)
Anion gap: 6 (ref 5–15)
BUN: 5 mg/dL — ABNORMAL LOW (ref 6–20)
CO2: 21 mmol/L — ABNORMAL LOW (ref 22–32)
Calcium: 8.3 mg/dL — ABNORMAL LOW (ref 8.9–10.3)
Chloride: 107 mmol/L (ref 98–111)
Creatinine, Ser: 0.64 mg/dL (ref 0.44–1.00)
GFR, Estimated: >60 mL/min (ref >60)
Glucose, Bld: 171 mg/dL — ABNORMAL HIGH (ref 70–99)
Potassium: 3.7 mmol/L (ref 3.5–5.1)
Sodium: 134 mmol/L — ABNORMAL LOW (ref 135–145)
Total Bilirubin: 3.2 mg/dL — ABNORMAL HIGH (ref 0.0–1.2)
Total Protein: 5.8 g/dL — ABNORMAL LOW (ref 6.5–8.1)

## 2023-11-22 LAB — CBC WITH DIFFERENTIAL/PLATELET
Abs Immature Granulocytes: 0 K/uL (ref 0.00–0.07)
Basophils Absolute: 0 K/uL (ref 0.0–0.1)
Basophils Relative: 1 %
Eosinophils Absolute: 0.1 K/uL (ref 0.0–0.5)
Eosinophils Relative: 5 %
HCT: 28.2 % — ABNORMAL LOW (ref 36.0–46.0)
Hemoglobin: 9.1 g/dL — ABNORMAL LOW (ref 12.0–15.0)
Immature Granulocytes: 0 %
Lymphocytes Relative: 27 %
Lymphs Abs: 0.5 K/uL — ABNORMAL LOW (ref 0.7–4.0)
MCH: 30.2 pg (ref 26.0–34.0)
MCHC: 32.3 g/dL (ref 30.0–36.0)
MCV: 93.7 fL (ref 80.0–100.0)
Monocytes Absolute: 0.3 K/uL (ref 0.1–1.0)
Monocytes Relative: 14 %
Neutro Abs: 1 K/uL — ABNORMAL LOW (ref 1.7–7.7)
Neutrophils Relative %: 53 %
Platelets: 57 K/uL — ABNORMAL LOW (ref 150–400)
RBC: 3.01 MIL/uL — ABNORMAL LOW (ref 3.87–5.11)
RDW: 14.7 % (ref 11.5–15.5)
WBC: 1.9 K/uL — ABNORMAL LOW (ref 4.0–10.5)
nRBC: 0 % (ref 0.0–0.2)

## 2023-11-22 LAB — PROTIME-INR
INR: 2 — ABNORMAL HIGH (ref 0.8–1.2)
Prothrombin Time: 23.3 s — ABNORMAL HIGH (ref 11.4–15.2)

## 2023-11-22 LAB — CULTURE, BODY FLUID W GRAM STAIN -BOTTLE

## 2023-11-22 LAB — MAGNESIUM: Magnesium: 1.7 mg/dL (ref 1.7–2.4)

## 2023-11-22 LAB — GLUCOSE, CAPILLARY: Glucose-Capillary: 203 mg/dL — ABNORMAL HIGH (ref 70–99)

## 2023-11-22 MED ORDER — MIDODRINE HCL 5 MG PO TABS
5.0000 mg | ORAL_TABLET | Freq: Two times a day (BID) | ORAL | 0 refills | Status: DC
  Filled 2023-11-22: qty 60, 30d supply, fill #0

## 2023-11-22 MED ORDER — HYDROCORTISONE 2.5 % EX CREA
TOPICAL_CREAM | Freq: Three times a day (TID) | CUTANEOUS | 0 refills | Status: DC
  Filled 2023-11-22: qty 28.35, 14d supply, fill #0

## 2023-11-22 MED ORDER — HYDROCORTISONE ACETATE 25 MG RE SUPP
25.0000 mg | Freq: Every day | RECTAL | 0 refills | Status: DC
  Filled 2023-11-22: qty 12, 12d supply, fill #0

## 2023-11-22 NOTE — Discharge Summary (Signed)
 Laurie Sutton FMW:[id number] DOB: 1980-05-05 DOA: 11/18/2023  PCP: [name], MD  Admit date: 11/18/2023  Discharge date: 11/22/2023  Admitted From: Home   Disposition:  Home   Recommendations for Outpatient Follow-up:   Follow up with PCP in 1-2 weeks  PCP Please obtain BMP/CBC, 2 view CXR in 1week,  (see Discharge instructions)   PCP Please follow up on the following pending results:    Home Health: None   Equipment/Devices: None  Consultations: GI, ID  Discharge Condition: Stable     CODE STATUS: Full     Diet Recommendation: Heart Healthy  Low Carb, 1.2 L fluid restriction.    Chief Complaint  Patient presents with   Rectal Bleeding     Brief history of present illness from the day of admission and additional interim summary    43 y.o. female with past medical history  of allergy to penicillin causing hives and itching and angioedema with a rash facial tongue throat swelling shortness of breath and lightheadedness with dizziness and hypotension, also allergic to vancomycin  amoxicillin azithromycin  dulaglutide , Januvia , metformin , Jardiance , latex, migraine, GERD, depression with anxiety, type 2 diabetes, decompensated liver cirrhosis secondary to NASH , splenic varices esophageal varices portal colopathy  esophageal varices portal hypertensive gastropathy, frequent UTI, hypertension, C. difficile colitis in June 2005 with a negative retest for C. difficile in July 2005.     Patient is being admitted today for rectal bleeding that started yesterday patient states that she does feel dizzy and has some weakness but no other than the abdominal distention and discomfort in the rectal bleeding she feels okay.  Patient is following up with hepatology clinic with [facility] where she is also being worked up  for possible liver transplant                                                                 Hospital Course   Bright red blood per rectum.  With some dizziness, fortunately H&H is stable, she was seen by GI underwent sigmoidoscopy suggesting hemorrhoidal bleed, she has been placed on appropriate medications clinically much improved will be discharged home with outpatient follow-up with her PCP and GI, no transfusions needed here.   NASH with cirrhosis, recurrent ascites and pancytopenia.  Supportive care, had ultrasound-guided paracentesis on 11/19/2023 with 6 L of fluid removed, no signs of SBP on the fluid but fluid culture growing gram-positive cocci in anaerobic bottle, this turned out to be a contamination, peritoneal fluid or clinical presentation was not consistent with SBP, also discussed with GI and ID.  Currently stable, continue fluid restriction and home diuretic regimen.   Chronic asymptomatic hypotension.  Chronic problem asymptomatic, low-dose midodrine .     Hypokalemia.  Replaced.   History of C. difficile.  Currently constipated, clinically resolved.  DM type II.  Continue home regimen.    Discharge diagnosis     Principal Problem:   GIB (gastrointestinal bleeding) Active Problems:   Decompensated cirrhosis (HCC)   Internal hemorrhoid, bleeding    Discharge instructions    Discharge Instructions     Discharge instructions   Complete by: As directed    Follow with Primary MD [name], MD in 7 days   Get CBC, CMP, Magnesium , 2 view Chest X ray -  checked next visit with your primary MD    Activity: As tolerated with Full fall precautions use walker/cane & assistance as needed  Disposition Home    Diet: Heart Healthy, low carbohydrate with 1.5 L fluid restriction.  Check CBGs q. ACHS.  Special Instructions: If you have smoked or chewed Tobacco  in the last 2 yrs please stop smoking, stop any regular Alcohol  and or any Recreational drug use.  On your  next visit with your primary care physician please Get Medicines reviewed and adjusted.  Please request your Prim.MD to go over all Hospital Tests and Procedure/Radiological results at the follow up, please get all Hospital records sent to your Prim MD by signing hospital release before you go home.  If you experience worsening of your admission symptoms, develop shortness of breath, life threatening emergency, suicidal or homicidal thoughts you must seek medical attention immediately by calling 911 or calling your MD immediately  if symptoms less severe.  You Must read complete instructions/literature along with all the possible adverse reactions/side effects for all the Medicines you take and that have been prescribed to you. Take any new Medicines after you have completely understood and accpet all the possible adverse reactions/side effects.   Do not drive when taking Pain medications.  Do not take more than prescribed Pain, Sleep and Anxiety Medications  Wear Seat belts while driving.   Increase activity slowly   Complete by: As directed        Discharge Medications   Allergies as of 11/22/2023       Reactions   Vancomycin  Itching   Amoxicillin Hives, Itching   Azithromycin     DOES NOT WORK   Dulaglutide  Other (See Comments)   constipation and stomach issues  **Trulicity **   Empagliflozin -metformin  Hcl Er Other (See Comments), Swelling   Januvia  [sitagliptin ] Other (See Comments)   HURTS STOAMCH   Latex    IRRITATES VAGINAL AREA   Metformin     Other Reaction(s): achy all over   Penicillins Hives   Has patient had a PCN reaction causing immediate rash, facial/tongue/throat swelling, SOB or lightheadedness with hypotension: yes. Rash  Has patient had a PCN reaction causing severe rash involving mucus membranes or skin necrosis: Yes- rash and hives all over body  Has patient had a PCN reaction that required hospitalization No Has patient had a PCN reaction occurring within  the last 10 years: Yes  If all of the above answers are NO, then may proceed with Cephalosporin use.   Vitamin K  And Related Other (See Comments)   Electric shock         Medication List     TAKE these medications    acetaminophen  325 MG tablet Commonly known as: TYLENOL  Take 2 tablets (650 mg total) by mouth every 6 (six) hours as needed for mild pain (pain score 1-3) or fever (or Fever >/= 101).   albuterol  108 (90 Base) MCG/ACT inhaler Commonly known as: VENTOLIN  HFA INHALE 2 PUFFS INTO THE LUNGS UP  TO EVERY 6 HOURS AS NEEDED FOR WHEEZING/SHORTNESS OF BREATH What changed: See the new instructions.   calcium carbonate 500 MG chewable tablet Commonly known as: TUMS - dosed in mg elemental calcium Chew 2 tablets by mouth as needed for indigestion or heartburn.   clotrimazole -betamethasone  cream Commonly known as: LOTRISONE  APPLY 1 APPLICATION TOPICALLY TWICE A DAY AS NEEDED What changed: See the new instructions.   Dexcom G7 Sensor Misc Use 1 sensor for continuous glucose monitoring every 10 days for 30 days   FLUoxetine  40 MG capsule Commonly known as: PROZAC  TAKE 1 CAPSULE BY MOUTH TWICE A DAY What changed:  how much to take when to take this   furosemide  80 MG tablet Commonly known as: LASIX  Take 1 tablet (80 mg total) by mouth daily. What changed: when to take this   HumuLIN  R U-500 KwikPen 500 UNIT/ML KwikPen Generic drug: insulin  regular human CONCENTRATED Use 120 units at breakfast, 100 at lunch, and 100 at dinner What changed: additional instructions   hydrocortisone  25 MG suppository Commonly known as: ANUSOL -HC Place 1 suppository (25 mg total) rectally at bedtime.   hydrocortisone -pramoxine 2.5-1 % rectal cream Commonly known as: ANALPRAM -HC Place rectally 3 (three) times daily.   lactulose  10 GM/15ML solution Commonly known as: CHRONULAC  Take 15 mLs (10 g total) by mouth 3 (three) times daily. What changed: when to take this   midodrine  5  MG tablet Commonly known as: PROAMATINE  Take 1 tablet (5 mg total) by mouth 2 (two) times daily with a meal.   montelukast  10 MG tablet Commonly known as: SINGULAIR  Take 1 tablet (10 mg total) by mouth at bedtime.   Mounjaro  7.5 MG/0.5ML Pen Generic drug: tirzepatide  Inject 7.5 mg Subcutaneous once a week 30 days   omeprazole  40 MG capsule Commonly known as: PRILOSEC TAKE 1 CAPSULE (40 MG TOTAL) BY MOUTH IN THE MORNING   ondansetron  4 MG tablet Commonly known as: Zofran  Take 1 tablet (4 mg total) by mouth every 8 (eight) hours as needed for nausea or vomiting.   spironolactone  100 MG tablet Commonly known as: ALDACTONE  Take 3 tablets (300 mg total) by mouth daily. What changed: when to take this   Vitamin D  (Ergocalciferol ) 1.25 MG (50000 UNIT) Caps capsule Commonly known as: DRISDOL  Take 50,000 Units by mouth every 7 (seven) days. No specific day   Xifaxan 550 MG Tabs tablet Generic drug: rifaximin Take 550 mg by mouth 2 (two) times daily.         Follow-up Information     [name], MD. Schedule an appointment as soon as possible for a visit in 1 week(s).   Specialty: Family Medicine Why: And your gastroenterologist within a week of discharge Contact information: [address] [place] [facility] [place] [address] [phone]                 Major procedures and Radiology Reports - PLEASE review detailed and final reports thoroughly  -       US  Paracentesis Result Date: 11/19/2023 INDICATION: 43 year old female with history of NASH cirrhosis column with recurrent ascites. IR was requested for diagnostic and therapeutic paracentesis. 6 L maximum. EXAM: ULTRASOUND GUIDED DIAGNOSTIC AND THERAPEUTIC PARACENTESIS MEDICATIONS: 8 cc of 1% lidocaine  COMPLICATIONS: None immediate. PROCEDURE: Informed written consent was obtained from the patient after a discussion of the risks, benefits and alternatives to treatment. A timeout was performed prior to the initiation  of the procedure. Initial ultrasound scanning demonstrates a large amount of ascites within the right  lower abdominal quadrant. The right lower abdomen was prepped and draped in the usual sterile fashion. 1% lidocaine  was used for local anesthesia. Following this, a 19 gauge, 7-cm, Yueh catheter was introduced. An ultrasound image was saved for documentation purposes. The paracentesis was performed. The catheter was removed and a dressing was applied. The patient tolerated the procedure well without immediate post procedural complication. Patient received post-procedure intravenous albumin ; see nursing notes for details. FINDINGS: A total of approximately 6 L of clear, straw-colored peritoneal fluid was removed. Samples were sent to the laboratory as requested by the clinical team. IMPRESSION: Successful ultrasound-guided paracentesis yielding 6 liters of peritoneal fluid. Procedure performed by Carlin Griffon, PA-C Electronically Signed   By: Juliene Balder M.D.   On: 11/19/2023 20:28   CT ABDOMEN PELVIS W CONTRAST Result Date: 11/18/2023 CLINICAL DATA:  Acute generalized abdominal pain EXAM: CT ABDOMEN AND PELVIS WITH CONTRAST TECHNIQUE: Multidetector CT imaging of the abdomen and pelvis was performed using the standard protocol following bolus administration of intravenous contrast. RADIATION DOSE REDUCTION: This exam was performed according to the departmental dose-optimization program which includes automated exposure control, adjustment of the mA and/or kV according to patient size and/or use of iterative reconstruction technique. CONTRAST:  75mL OMNIPAQUE  IOHEXOL  350 MG/ML SOLN COMPARISON:  October 29, 2023. FINDINGS: Lower chest: No acute abnormality. Hepatobiliary: Hepatic cirrhosis is noted. Minimal cholelithiasis. No biliary dilatation. Pancreas: Unremarkable. No pancreatic ductal dilatation or surrounding inflammatory changes. Spleen: Moderate splenomegaly is noted. Adrenals/Urinary Tract: Adrenal glands are  unremarkable. Kidneys are normal, without renal calculi, focal lesion, or hydronephrosis. Bladder is unremarkable. Stomach/Bowel: Stomach is within normal limits. Appendix appears normal. No evidence of bowel wall thickening, distention, or inflammatory changes. Vascular/Lymphatic: Tortuosity of varices seen in left upper quadrant consistent with portal hypertension. No enlarged abdominal or pelvic lymph nodes. Reproductive: Status post hysterectomy. 2.2 cm left adnexal cyst is noted. Other: Moderate ascites is noted.  No definite hernia is noted. Musculoskeletal: No acute or significant osseous findings. IMPRESSION: Hepatic cirrhosis with moderate splenomegaly and moderate ascites and left upper quadrant varices suggesting portal hypertension. Minimal cholelithiasis. Electronically Signed   By: Lynwood Landy Raddle M.D.   On: 11/18/2023 17:34   IR Paracentesis Result Date: 11/08/2023 INDICATION: Patient with a history of NASH cirrhosis with recurrent ascites. Interventional radiology asked to perform a therapeutic paracentesis within 8 L max. EXAM: ULTRASOUND GUIDED PARACENTESIS MEDICATIONS: 1% lidocaine  with epinephrine , 10 mL COMPLICATIONS: None immediate. PROCEDURE: Informed written consent was obtained from the patient after a discussion of the risks, benefits and alternatives to treatment. A timeout was performed prior to the initiation of the procedure. Initial ultrasound scanning demonstrates a large amount of ascites within the left lower abdominal quadrant. The left lower abdomen was prepped and draped in the usual sterile fashion. 1% lidocaine  was used for local anesthesia. Following this, a 19 gauge, 7-cm, Yueh catheter was introduced. An ultrasound image was saved for documentation purposes. The paracentesis was performed. The catheter was removed and a dressing was applied. The patient tolerated the procedure well without immediate post procedural complication. Patient received post-procedure intravenous  albumin ; see nursing notes for details. FINDINGS: A total of approximately 6 L of clear yellow fluid was removed. IMPRESSION: Successful ultrasound-guided paracentesis yielding 6 liters of peritoneal fluid. Procedure performed by: Warren Dais, NP PLAN: Patient actively undergoing workup for liver transplant with [facility]. Electronically Signed   By: CHRISTELLA.  Shick M.D.   On: 11/08/2023 12:24   US  Abdomen Limited Result Date:  10/31/2023 EXAM: LIMITED ABDOMINAL ULTRASOUND FOR ASCITES EVALUATION TECHNIQUE: Limited real-time sonography of all 4 quadrants of the abdomen was performed for evaluation of ascites. COMPARISON: None. CLINICAL HISTORY: 356290 Ascites [id number]. Ascites FINDINGS: RIGHT UPPER QUADRANT: Mild perihepatic ascites. LEFT UPPER QUADRANT: Mild perisplenical ascites. RIGHT LOWER QUADRANT: Mild ascites. LEFT LOWER QUADRANT: Mild ascites. OTHER: Limited visualization of the rest of the abdomen demonstrates no acute abnormality. IMPRESSION: 1. Mild ascites in all 4 abdominal quadrants, likely insufficient for paracentesis. Electronically signed by: Pinkie Pebbles MD 10/31/2023 08:19 PM EDT RP Workstation: [id number]   US  Paracentesis Result Date: 10/29/2023 INDICATION: Patient with history of NASH cirrhosis, esophageal varices, portal hypertension, colitis, peritonitis, ascites. Request received for therapeutic paracentesis. EXAM: ULTRASOUND GUIDED THERAPEUTIC  PARACENTESIS MEDICATIONS: 8 mL 1% lidocaine  COMPLICATIONS: None immediate. PROCEDURE: Informed written consent was obtained from the patient after a discussion of the risks, benefits and alternatives to treatment. A timeout was performed prior to the initiation of the procedure. Initial ultrasound scanning demonstrates a small to moderate amount of ascites within the right lower abdominal quadrant. The right lower abdomen was prepped and draped in the usual sterile fashion. 1% lidocaine  was used for local anesthesia. Following this, a 19  gauge,10-cm, Yueh catheter was introduced. An ultrasound image was saved for documentation purposes. The paracentesis was performed. The catheter was removed and a dressing was applied. The patient tolerated the procedure well without immediate post procedural complication. FINDINGS: A total of approximately 2.7 liters of amber fluid was removed. IMPRESSION: Successful ultrasound-guided therapeutic paracentesis yielding 2.7 liters of peritoneal fluid. PLAN: The patient has required >/=2 paracenteses in a 30 day period and a formal evaluation by the [facility] has been arranged. Performed by: Franky Kelsie RIGGERS Electronically Signed   By: Juliene Balder M.D.   On: 10/29/2023 14:45   CT ABDOMEN PELVIS W CONTRAST Result Date: 10/29/2023 CLINICAL DATA:  Abdominal pain. EXAM: CT ABDOMEN AND PELVIS WITH CONTRAST TECHNIQUE: Multidetector CT imaging of the abdomen and pelvis was performed using the standard protocol following bolus administration of intravenous contrast. RADIATION DOSE REDUCTION: This exam was performed according to the departmental dose-optimization program which includes automated exposure control, adjustment of the mA and/or kV according to patient size and/or use of iterative reconstruction technique. CONTRAST:  75mL OMNIPAQUE  IOHEXOL  350 MG/ML SOLN COMPARISON:  October 04, 2023 FINDINGS: Lower chest: No acute abnormality. Hepatobiliary: The liver is cirrhotic in appearance without evidence of focal liver lesions. Tiny gallstones are seen without evidence of gallbladder wall thickening, pericholecystic inflammation or biliary dilatation. Pancreas: Unremarkable. No pancreatic ductal dilatation or surrounding inflammatory changes. Spleen: There is mild to moderate severity splenomegaly. Adrenals/Urinary Tract: Adrenal glands are unremarkable. Kidneys are normal, without renal calculi, focal lesion, or hydronephrosis. Bladder is unremarkable. Stomach/Bowel:  Stomach is within normal limits. Appendix appears normal. No evidence of bowel dilatation. The wall of the ascending colon is markedly thickened. Vascular/Lymphatic: Stable splenic varices are seen. No additional significant vascular findings are present. No enlarged abdominal or pelvic lymph nodes. Reproductive: Status post hysterectomy. A 2.0 cm cyst is seen within the left adnexa. The right adnexa is unremarkable. Other: No abdominal wall hernia or abnormality. Moderate to marked severity abdominopelvic ascites is seen. Musculoskeletal: No acute or significant osseous findings. IMPRESSION: 1. Cirrhosis with splenomegaly and splenic varices. 2. Moderate to marked severity abdominopelvic ascites. 3. Marked severity colitis involving the ascending colon. 4. Cholelithiasis. 5. 2.0 cm left adnexal cyst, likely ovarian in origin. No follow-up imaging is recommended. This  recommendation follows ACR consensus guidelines: White Paper of the ACR Incidental Findings Committee II on Adnexal Findings. J Am Coll Radiol [id number]. Electronically Signed   By: Suzen Dials M.D.   On: 10/29/2023 01:23    Micro Results    Recent Results (from the past 240 hours)  MRSA Next Gen by PCR, Nasal     Status: None   Collection Time: 11/18/23 11:49 PM   Specimen: Nasal Mucosa; Nasal Swab  Result Value Ref Range Status   MRSA by PCR Next Gen NOT DETECTED NOT DETECTED Final    Comment: (NOTE) The GeneXpert MRSA Assay (FDA approved for NASAL specimens only), is one component of a comprehensive MRSA colonization surveillance program. It is not intended to diagnose MRSA infection nor to guide or monitor treatment for MRSA infections. Test performance is not FDA approved in patients less than 27 years old. Performed at [facility], [address], [place], [place] [address]   C Difficile Quick Screen w PCR reflex     Status: None   Collection Time: 11/19/23 12:19 AM   Specimen: STOOL  Result Value Ref  Range Status   C Diff antigen NEGATIVE NEGATIVE Final   C Diff toxin NEGATIVE NEGATIVE Final   C Diff interpretation No C. difficile detected.  Final    Comment: Performed at [facility], [address], [place], [place] [address]  Gastrointestinal Panel by PCR , Stool     Status: None   Collection Time: 11/19/23 12:19 AM   Specimen: Stool  Result Value Ref Range Status   Campylobacter species NOT DETECTED NOT DETECTED Final   Plesimonas shigelloides NOT DETECTED NOT DETECTED Final   Salmonella species NOT DETECTED NOT DETECTED Final   Yersinia enterocolitica NOT DETECTED NOT DETECTED Final   Vibrio species NOT DETECTED NOT DETECTED Final   Vibrio cholerae NOT DETECTED NOT DETECTED Final   Enteroaggregative E coli (EAEC) NOT DETECTED NOT DETECTED Final   Enteropathogenic E coli (EPEC) NOT DETECTED NOT DETECTED Final   Enterotoxigenic E coli (ETEC) NOT DETECTED NOT DETECTED Final   Shiga like toxin producing E coli (STEC) NOT DETECTED NOT DETECTED Final   Shigella/Enteroinvasive E coli (EIEC) NOT DETECTED NOT DETECTED Final   Cryptosporidium NOT DETECTED NOT DETECTED Final   Cyclospora cayetanensis NOT DETECTED NOT DETECTED Final   Entamoeba histolytica NOT DETECTED NOT DETECTED Final   Giardia lamblia NOT DETECTED NOT DETECTED Final   Adenovirus F40/41 NOT DETECTED NOT DETECTED Final   Astrovirus NOT DETECTED NOT DETECTED Final   Norovirus GI/GII NOT DETECTED NOT DETECTED Final   Rotavirus A NOT DETECTED NOT DETECTED Final   Sapovirus (I, II, IV, and V) NOT DETECTED NOT DETECTED Final    Comment: Performed at [facility], [address] [place], [place] [address]  Culture, body fluid w Gram Stain-bottle     Status: Abnormal   Collection Time: 11/19/23 11:27 AM   Specimen: Peritoneal Washings  Result Value Ref Range Status   Specimen Description PERITONEAL  Final   Special Requests ABDOMEN  Final   Gram Stain   Final    GRAM POSITIVE COCCI IN CLUSTERS ANAEROBIC  BOTTLE ONLY CRITICAL RESULT CALLED TO, READ BACK BY AND VERIFIED WITH: RN SONNY POUR [id number] [id number] FCP Performed at [facility], [address] [place], [place] [address]    Culture STAPHYLOCOCCUS EPIDERMIDIS (A)  Final   Report Status 11/22/2023 FINAL  Final   Organism ID, Bacteria STAPHYLOCOCCUS EPIDERMIDIS  Final      Susceptibility   Staphylococcus epidermidis - MIC*    CIPROFLOXACIN  <=0.5 SENSITIVE Sensitive     ERYTHROMYCIN <=0.25 SENSITIVE Sensitive     GENTAMICIN  <=0.5 SENSITIVE Sensitive     OXACILLIN <=0.25 SENSITIVE Sensitive     TETRACYCLINE >=16 RESISTANT Resistant     VANCOMYCIN  1 SENSITIVE Sensitive     TRIMETH /SULFA  <=10 SENSITIVE Sensitive     CLINDAMYCIN  <=0.25 SENSITIVE Sensitive     RIFAMPIN <=0.5 SENSITIVE Sensitive     Inducible Clindamycin  NEGATIVE Sensitive     * STAPHYLOCOCCUS EPIDERMIDIS  Gram stain     Status: None   Collection Time: 11/19/23 11:27 AM   Specimen: Peritoneal Washings  Result Value Ref Range Status   Specimen Description PERITONEAL  Final   Special Requests ABDOMEN  Final   Gram Stain   Final    RARE WBC PRESENT, PREDOMINANTLY PMN NO ORGANISMS SEEN Performed at [facility], [address] [place], [place] [address]    Report Status 11/19/2023 FINAL  Final    Today   Subjective    Shinita Mac today has no headache,no chest abdominal pain,no new weakness tingling or numbness, feels much better wants to go home today.    Objective   Blood pressure (!) 102/46, pulse 63, temperature 97.7 F (36.5 C), temperature source Oral, resp. rate (!) 24, height 5' 6 (1.676 m), weight 89.4 kg, last menstrual period 11/13/2020, SpO2 99%.   Intake/Output Summary (Last 24 hours) at 11/22/2023 0848 Last data filed at 11/21/2023 1819 Gross per 24 hour  Intake 50 ml  Output --  Net 50 ml    Exam  Awake Alert, No new F.N deficits,    Arma.AT,PERRAL Supple Neck,   Symmetrical Chest wall movement, Good air movement bilaterally,  CTAB RRR,No Gallops,   +ve B.Sounds, Abd Soft, Non tender,  No Cyanosis, Clubbing or edema    Data Review   Recent Labs  Lab 11/19/23 1501 11/20/23 0043 11/20/23 0740 11/21/23 0616 11/22/23 0519  WBC 2.0* 2.4* 2.8* 2.2* 1.9*  HGB 8.8* 9.4* 10.6* 9.4* 9.1*  HCT 26.1* 28.2* 32.3* 28.5* 28.2*  PLT 55* 55* 71* 57* 57*  MCV 92.6 92.8 93.9 94.1 93.7  MCH 31.2 30.9 30.8 31.0 30.2  MCHC 33.7 33.3 32.8 33.0 32.3  RDW 14.7 14.8 14.7 14.9 14.7  LYMPHSABS 0.4* 0.5* 0.6* 0.5* 0.5*  MONOABS 0.3 0.4 0.3 0.2 0.3  EOSABS 0.1 0.1 0.2 0.2 0.1  BASOSABS 0.0 0.0 0.0 0.0 0.0    Recent Labs  Lab 11/18/23 1135 11/18/23 1528 11/18/23 1616 11/18/23 1941 11/19/23 0602 11/20/23 0740 11/21/23 0616 11/22/23 0519  NA 132*  --   --  132* 133* 134* 134* 134*  K 2.7*  --   --  2.9* 3.1* 3.2* 3.9 3.7  CL 96*  --   --  96* 100 101 106 107  CO2 23  --   --  27 24 23  20* 21*  ANIONGAP 13  --   --  9 9 10 8 6   GLUCOSE 266*  --   --  209* 196* 175* 143* 171*  BUN 5*  --   --  <5* <5* <5* <5* 5*  CREATININE 0.75  --   --  0.69 0.73 0.70 0.58 0.64  AST 39  --   --   --  34 38 33 36  ALT 25  --   --   --  23 22 19 20   ALKPHOS  94  --   --   --  82 79 63 64  BILITOT 3.8*  --   --   --  3.4* 3.9* 4.2* 3.2*  ALBUMIN  2.7*  --   --   --  2.5* 3.1* 3.1* 2.8*  INR  --   --  1.7*  --   --  1.7* 1.8* 2.0*  MG  --  1.6*  --   --  2.0 1.8 1.7 1.7  PHOS  --   --   --   --  2.9  --   --   --   CALCIUM 8.2*  --   --  8.2* 8.1* 8.6* 8.5* 8.3*    Total Time in preparing paper work, data evaluation and todays exam - 35 minutes  Signature  -    Lavada Stank M.D on 11/22/2023 at 8:48 AM   -  To page go to [email or web address]

## 2023-11-22 NOTE — Progress Notes (Addendum)
 AVS and discharge POC teaching completed. Patient expressed verbal understanding of plan of care. PIV removed.   Dressing intact.  Tele removed and telemetry called [place]).  RIDE arrived [facility] meds ready. Volunteer called.

## 2023-11-22 NOTE — Telephone Encounter (Signed)
Patient Product/process development scientist completed.    The patient is insured through [employer] [facility]. Patient has [employer], may use a copay card, and/or apply for patient assistance if available.    Ran test claim for Xifaxan 550 mg and the current 30 day co-pay is $0.00.   This test claim was processed through [facility]- copay amounts may vary at other pharmacies due to pharmacy/plan contracts, or as the patient moves through the different stages of their insurance plan.     Roland Earl, CPHT Pharmacy Technician III Certified Patient Advocate [facility] Patient Advocate Team Direct Number: [phone]  Fax: [phone]

## 2023-11-22 NOTE — Plan of Care (Signed)

## 2023-11-22 NOTE — Discharge Instructions (Addendum)
 Follow with Primary MD [name], MD in 7 days   Get CBC, CMP, Magnesium , 2 view Chest X ray -  checked next visit with your primary MD    Activity: As tolerated with Full fall precautions use walker/cane & assistance as needed  Disposition Home    Diet: Heart Healthy, low carbohydrate with 1.5 L fluid restriction.  Check CBGs q. ACHS.  Special Instructions: If you have smoked or chewed Tobacco  in the last 2 yrs please stop smoking, stop any regular Alcohol  and or any Recreational drug use.  On your next visit with your primary care physician please Get Medicines reviewed and adjusted.  Please request your Prim.MD to go over all Hospital Tests and Procedure/Radiological results at the follow up, please get all Hospital records sent to your Prim MD by signing hospital release before you go home.  If you experience worsening of your admission symptoms, develop shortness of breath, life threatening emergency, suicidal or homicidal thoughts you must seek medical attention immediately by calling 911 or calling your MD immediately  if symptoms less severe.  You Must read complete instructions/literature along with all the possible adverse reactions/side effects for all the Medicines you take and that have been prescribed to you. Take any new Medicines after you have completely understood and accpet all the possible adverse reactions/side effects.   Do not drive when taking Pain medications.  Do not take more than prescribed Pain, Sleep and Anxiety Medications  Wear Seat belts while driving.

## 2023-11-22 NOTE — Progress Notes (Signed)
 ID PROGRESS NOTE  Micro culture shows staph epi. Discussed with Dr Dennise, also with GI that suspect this is contamination, and she does not have SBP. Initial presentation of Rectal bleeding. Her cell count from paracentesis did not meet criteria for SBP as well.  Recommend discontinue of antibiotics.  Montie FURY Luiz MD MPH [facility] [phone]

## 2023-11-23 ENCOUNTER — Telehealth: Payer: Self-pay | Admitting: *Deleted

## 2023-11-23 ENCOUNTER — Ambulatory Visit: Admitting: Gastroenterology

## 2023-11-23 LAB — ACID FAST SMEAR (AFB, MYCOBACTERIA): Acid Fast Smear: NEGATIVE

## 2023-11-23 NOTE — Transitions of Care (Post Inpatient/ED Visit) (Signed)
 11/23/2023  Name: Laurie Sutton MRN: [id number] DOB: Oct 30, 1980  Today's TOC FU Call Status: Today's TOC FU Call Status:: Successful TOC FU Call Completed TOC FU Call Complete Date: 11/23/23 Patient's Name and Date of Birth confirmed.  Transition Care Management Follow-up Telephone Call Date of Discharge: 11/22/23 Discharge Facility: [facility]) Type of Discharge: Inpatient Admission Primary Inpatient Discharge Diagnosis:: gastrointestinal bleeding How have you been since you were released from the hospital?: Better Any questions or concerns?: No  Items Reviewed: Did you receive and understand the discharge instructions provided?: Yes Medications obtained,verified, and reconciled?: Yes (Medications Reviewed) Any new allergies since your discharge?: No Dietary orders reviewed?: Yes Type of Diet Ordered:: Heart Healthy, low carbohydrate with 1.5 L fluid restriction Do you have support at home?: Yes People in Home [RPT]: spouse Name of Support/Comfort Primary Source: Adam  Medications Reviewed Today: Medications Reviewed Today     Reviewed by Kennieth Cathlean DEL, RN (Case Manager) on 11/23/23 at 1320  Med List Status: <None>   Medication Order Taking? Sig Documenting Provider Last Dose Status Informant  acetaminophen  (TYLENOL ) 325 MG tablet [id number] Yes Take 2 tablets (650 mg total) by mouth every 6 (six) hours as needed for mild pain (pain score 1-3) or fever (or Fever >/= 101). Sheikh, Omair De Motte, DO  Active Self, Pharmacy Records  albuterol  (VENTOLIN  HFA) 108 [phone] Base) MCG/ACT inhaler [id number] Yes INHALE 2 PUFFS INTO THE LUNGS UP TO EVERY 6 HOURS AS NEEDED FOR WHEEZING/SHORTNESS OF BREATH  Patient taking differently: Inhale 2 puffs into the lungs every 6 (six) hours as needed for wheezing or shortness of breath.   [name], MD  Active Self, Pharmacy Records           Med Note [facility], DONETA GORMAN   Sat Oct 29, 2023  5:48 AM)    calcium carbonate (TUMS - DOSED IN  MG ELEMENTAL CALCIUM) 500 MG chewable tablet [id number] Yes Chew 2 tablets by mouth as needed for indigestion or heartburn. [provider]  Active Self, Pharmacy Records  clotrimazole -betamethasone  (LOTRISONE ) cream [id number] Yes APPLY 1 APPLICATION TOPICALLY TWICE A DAY AS NEEDED  Patient taking differently: Apply 1 Application topically 2 (two) times daily as needed.   [name], MD  Active Self, Pharmacy Records  Continuous Glucose Sensor [facility] G7 [place]) [place] [id number] Yes Use 1 sensor for continuous glucose monitoring every 10 days for 30 days Braulio Hough, MD  Active Self, Pharmacy Records  FLUoxetine  (PROZAC ) 40 MG capsule [id number]  TAKE 1 CAPSULE BY MOUTH TWICE A DAY  Patient taking differently: Take 80 mg by mouth in the morning.   [name], MD  Active Self, Pharmacy Records           Med Note Laurie Sutton   Sutton Nov 19, 2023  5:54 PM) Patient unsure if she took her morning medication Friday morning.  furosemide  (LASIX ) 80 MG tablet [id number] Yes Take 1 tablet (80 mg total) by mouth daily.  Patient taking differently: Take 80 mg by mouth in the morning.   Sherrill Alejandro Donovan, DO  Active Self, Pharmacy Records           Med Note Laurie Sutton   Sutton Nov 19, 2023  5:54 PM) Patient unsure if she took her morning medication Friday morning.  HUMULIN  R U-500 KWIKPEN 500 UNIT/ML KwikPen [id number] Yes Use 120 units at breakfast, 100 at lunch, and 100 at dinner  Patient taking differently: Use 60 units at  breakfast, 65 at lunch, and 65 at dinner   [name], MD  Active Self, Pharmacy Records           Med Note [facility], DONETA RAMAN   Sat Oct 29, 2023  5:50 AM)    hydrocortisone  (ANUSOL -HC) 25 MG suppository [id number] Yes Place 1 suppository (25 mg total) rectally at bedtime. Singh, Prashant K, MD  Active   hydrocortisone  2.5 % cream [id number] Yes Use 3 (three) times daily rectally Dennise Lavada POUR, MD  Active   lactulose  (CHRONULAC ) 10 GM/15ML solution  [id number] Yes Take 15 mLs (10 g total) by mouth 3 (three) times daily.  Patient taking differently: Take 10 g by mouth in the morning and at bedtime.   Sira, Zackery, MD  Active Self, Pharmacy Records  midodrine  (PROAMATINE ) 5 MG tablet [id number] Yes Take 1 tablet (5 mg total) by mouth 2 (two) times daily with a meal. Singh, Prashant K, MD  Active   montelukast  (SINGULAIR ) 10 MG tablet [id number] Yes Take 1 tablet (10 mg total) by mouth at bedtime. [name], MD  Active Self, Pharmacy Records           Med Note Laurie Sutton   Sutton Nov 19, 2023  5:54 PM) Patient unsure if she took her morning medication Friday morning.  omeprazole  (PRILOSEC) 40 MG capsule [id number] Yes TAKE 1 CAPSULE (40 MG TOTAL) BY MOUTH IN THE MORNING Danis, Victory Sutton MOULD, MD  Active Self, Pharmacy Records           Med Note Laurie Sutton   Sutton Nov 19, 2023  5:54 PM) Patient unsure if she took her morning medication Friday morning.  ondansetron  (ZOFRAN ) 4 MG tablet [id number] Yes Take 1 tablet (4 mg total) by mouth every 8 (eight) hours as needed for nausea or vomiting. Craig Alan SAUNDERS, PA-C  Active Self, Pharmacy Records  spironolactone  (ALDACTONE ) 100 MG tablet [id number] Yes Take 3 tablets (300 mg total) by mouth daily.  Patient taking differently: Take 300 mg by mouth in the morning.   Sira, Zackery, MD  Active Self, Pharmacy Records           Med Note Laurie Sutton   Sutton Nov 19, 2023  5:54 PM) Patient unsure if she took her morning medication Friday morning.  tirzepatide  (MOUNJARO ) 7.5 MG/0.5ML Pen [id number]  Inject 7.5 mg Subcutaneous once a week 30 days  Patient not taking: Reported on 11/23/2023     Active Self, Pharmacy Records           Med Note [place], NATHANEL SAILOR   Fri Nov 18, 2023  2:12 PM)    Vitamin D , Ergocalciferol , (DRISDOL ) 1.25 MG (50000 UNIT) CAPS capsule [id number] Yes Take 50,000 Units by mouth every 7 (seven) days. No specific day [provider]  Active Self, Pharmacy Records   XIFAXAN 550 MG TABS tablet [id number]  Take 550 mg by mouth 2 (two) times daily.  Patient not taking: Reported on 11/23/2023   [provider]  Active Self, Pharmacy Records            Home Care and Equipment/Supplies: Were Home Health Services Ordered?: NA Any new equipment or medical supplies ordered?: NA  Functional Questionnaire: Do you need assistance with bathing/showering or dressing?: No Do you need assistance with meal preparation?: No Do you need assistance with eating?: No Do you have difficulty maintaining continence: No Do you need assistance with getting out of bed/getting out of a chair/moving?: No  Do you have difficulty managing or taking your medications?: No  Follow up appointments reviewed: PCP Follow-up appointment confirmed?: Yes Date of PCP follow-up appointment?: 11/28/23 Follow-up Provider: Dr Garnette [place] Specialist [facility] Follow-up appointment confirmed?: NA Do you need transportation to your follow-up appointment?: No Do you understand care options if your condition(s) worsen?: Yes-patient verbalized understanding  SDOH Interventions Today    Flowsheet Row Most Recent Value  SDOH Interventions   Food Insecurity Interventions Intervention Not Indicated  Housing Interventions Intervention Not Indicated  Transportation Interventions Intervention Not Indicated, Patient Resources (Friends/Family)  Utilities Interventions Intervention Not Indicated    Cathlean Headland BSN RN [facility] Care Management Coordinator [email or web address] Direct Dial: [phone]  Fax: [phone] Website: [email or web address]

## 2023-11-28 ENCOUNTER — Ambulatory Visit (HOSPITAL_COMMUNITY)
Admission: RE | Admit: 2023-11-28 | Discharge: 2023-11-28 | Disposition: A | Discharge status: Home / Self Care | Source: Ambulatory Visit | Attending: Nurse Practitioner | Admitting: Nurse Practitioner

## 2023-11-28 ENCOUNTER — Encounter: Payer: Self-pay | Admitting: Gastroenterology

## 2023-11-28 ENCOUNTER — Inpatient Hospital Stay: Admitting: Family Medicine

## 2023-11-28 DIAGNOSIS — K7581 Nonalcoholic steatohepatitis (NASH): Secondary | ICD-10-CM | POA: Insufficient documentation

## 2023-11-28 DIAGNOSIS — R188 Other ascites: Secondary | ICD-10-CM | POA: Diagnosis not present

## 2023-11-28 DIAGNOSIS — K7460 Unspecified cirrhosis of liver: Secondary | ICD-10-CM | POA: Diagnosis present

## 2023-11-28 HISTORY — PX: IR PARACENTESIS: IMG2679

## 2023-11-28 MED ORDER — LIDOCAINE-EPINEPHRINE 1 %-1:100000 IJ SOLN
INTRAMUSCULAR | Status: AC
  Filled 2023-11-28: qty 1

## 2023-11-28 MED ORDER — ALBUMIN HUMAN 25 % IV SOLN
INTRAVENOUS | Status: AC
  Filled 2023-11-28: qty 100

## 2023-11-28 MED ORDER — LIDOCAINE-EPINEPHRINE 1 %-1:100000 IJ SOLN
20.0000 mL | Freq: Once | INTRAMUSCULAR | Status: AC
  Administered 2023-11-28: 10 mL via INTRADERMAL

## 2023-11-28 MED ORDER — ALBUMIN HUMAN 25 % IV SOLN
25.0000 g | Freq: Once | INTRAVENOUS | Status: AC
  Administered 2023-11-28: 25 g via INTRAVENOUS

## 2023-11-28 NOTE — Procedures (Signed)
 PROCEDURE SUMMARY:  Successful image-guided paracentesis from the left abdomen.  Yielded 5.2 liters of clear, yellow fluid.  No immediate complications.  EBL: zero Patient tolerated well.   Specimen not sent for labs.  Please see imaging section of Epic for full dictation.  Tyreck Bell NP 11/28/2023 10:15 AM

## 2023-11-29 ENCOUNTER — Inpatient Hospital Stay: Admitting: Family Medicine

## 2023-12-04 ENCOUNTER — Ambulatory Visit
Admission: RE | Admit: 2023-12-04 | Discharge: 2023-12-04 | Disposition: A | Discharge status: Home / Self Care | Source: Ambulatory Visit | Attending: Nurse Practitioner

## 2023-12-04 DIAGNOSIS — K7469 Other cirrhosis of liver: Secondary | ICD-10-CM

## 2023-12-04 DIAGNOSIS — D376 Neoplasm of uncertain behavior of liver, gallbladder and bile ducts: Secondary | ICD-10-CM

## 2023-12-04 MED ORDER — GADOPICLENOL 0.5 MMOL/ML IV SOLN
7.0000 mL | Freq: Once | INTRAVENOUS | Status: AC | PRN
  Administered 2023-12-04: 7 mL via INTRAVENOUS

## 2023-12-05 ENCOUNTER — Encounter: Payer: Self-pay | Admitting: Family Medicine

## 2023-12-06 ENCOUNTER — Encounter: Payer: Self-pay | Admitting: Gastroenterology

## 2023-12-06 NOTE — Telephone Encounter (Signed)
 VV Appointment scheduled for 12/13/23

## 2023-12-06 NOTE — Telephone Encounter (Signed)
 Please set up an OV next week so we can do this together

## 2023-12-09 ENCOUNTER — Ambulatory Visit (HOSPITAL_COMMUNITY)
Admission: RE | Admit: 2023-12-09 | Discharge: 2023-12-09 | Disposition: A | Discharge status: Home / Self Care | Source: Ambulatory Visit | Attending: Nurse Practitioner | Admitting: Nurse Practitioner

## 2023-12-09 ENCOUNTER — Encounter: Payer: Self-pay | Admitting: Gastroenterology

## 2023-12-09 DIAGNOSIS — R188 Other ascites: Secondary | ICD-10-CM | POA: Insufficient documentation

## 2023-12-09 HISTORY — PX: IR PARACENTESIS: IMG2679

## 2023-12-09 MED ORDER — ALBUMIN HUMAN 25 % IV SOLN
INTRAVENOUS | Status: AC
  Filled 2023-12-09: qty 200

## 2023-12-09 MED ORDER — ALBUMIN HUMAN 25 % IV SOLN
50.0000 g | Freq: Once | INTRAVENOUS | Status: AC
  Administered 2023-12-09: 50 g via INTRAVENOUS

## 2023-12-09 MED ORDER — LIDOCAINE HCL 1 % IJ SOLN
20.0000 mL | Freq: Once | INTRAMUSCULAR | Status: AC
  Administered 2023-12-09: 20 mL

## 2023-12-09 MED ORDER — LIDOCAINE HCL 1 % IJ SOLN
INTRAMUSCULAR | Status: AC
Start: 2023-12-09 — End: 2023-12-09
  Filled 2023-12-09: qty 20

## 2023-12-09 NOTE — Procedures (Signed)
 PROCEDURE SUMMARY:  Successful US  guided paracentesis from left lateral abdomen.  Yielded 5.9 liters of serous fluid.  No immediate complications.  Patient tolerated well.  EBL = trace  Sajjad Honea CHRISTELLA Bal PA-C 12/09/2023 8:47 AM

## 2023-12-13 ENCOUNTER — Telehealth: Admitting: Family Medicine

## 2023-12-13 NOTE — Telephone Encounter (Unsigned)
 Copied from CRM [id number]. Topic: General - Other >> Dec 13, 2023  3:17 PM Drema MATSU wrote: Reason for CRM: Patient ask that Dr. Johnny resend FMLA paperwork to [employer] or she can pick it up tomorrow. She states that her last day to get it is tomorrow.

## 2023-12-13 NOTE — Telephone Encounter (Signed)
 This is being taken care of by Dr. Legrand (GI office)

## 2023-12-15 ENCOUNTER — Observation Stay (HOSPITAL_COMMUNITY)

## 2023-12-15 ENCOUNTER — Other Ambulatory Visit: Payer: Self-pay

## 2023-12-15 ENCOUNTER — Emergency Department (HOSPITAL_COMMUNITY)

## 2023-12-15 ENCOUNTER — Encounter (HOSPITAL_COMMUNITY): Payer: Self-pay | Admitting: Internal Medicine

## 2023-12-15 ENCOUNTER — Inpatient Hospital Stay (HOSPITAL_COMMUNITY)
Admission: EM | Admit: 2023-12-15 | Discharge: 2023-12-19 | DRG: 433 | Disposition: A | Discharge status: Home / Self Care | Attending: Internal Medicine | Admitting: Internal Medicine

## 2023-12-15 DIAGNOSIS — Z825 Family history of asthma and other chronic lower respiratory diseases: Secondary | ICD-10-CM

## 2023-12-15 DIAGNOSIS — F419 Anxiety disorder, unspecified: Secondary | ICD-10-CM | POA: Diagnosis present

## 2023-12-15 DIAGNOSIS — K7469 Other cirrhosis of liver: Secondary | ICD-10-CM | POA: Diagnosis not present

## 2023-12-15 DIAGNOSIS — Z888 Allergy status to other drugs, medicaments and biological substances status: Secondary | ICD-10-CM

## 2023-12-15 DIAGNOSIS — I9589 Other hypotension: Secondary | ICD-10-CM | POA: Diagnosis not present

## 2023-12-15 DIAGNOSIS — R059 Cough, unspecified: Secondary | ICD-10-CM | POA: Diagnosis present

## 2023-12-15 DIAGNOSIS — K7460 Unspecified cirrhosis of liver: Secondary | ICD-10-CM | POA: Diagnosis not present

## 2023-12-15 DIAGNOSIS — Z87442 Personal history of urinary calculi: Secondary | ICD-10-CM

## 2023-12-15 DIAGNOSIS — Z7682 Awaiting organ transplant status: Secondary | ICD-10-CM

## 2023-12-15 DIAGNOSIS — K429 Umbilical hernia without obstruction or gangrene: Secondary | ICD-10-CM | POA: Diagnosis present

## 2023-12-15 DIAGNOSIS — R509 Fever, unspecified: Secondary | ICD-10-CM | POA: Diagnosis present

## 2023-12-15 DIAGNOSIS — F32A Depression, unspecified: Secondary | ICD-10-CM | POA: Diagnosis present

## 2023-12-15 DIAGNOSIS — K7682 Hepatic encephalopathy: Secondary | ICD-10-CM | POA: Diagnosis not present

## 2023-12-15 DIAGNOSIS — D61818 Other pancytopenia: Secondary | ICD-10-CM | POA: Diagnosis present

## 2023-12-15 DIAGNOSIS — L299 Pruritus, unspecified: Secondary | ICD-10-CM | POA: Diagnosis present

## 2023-12-15 DIAGNOSIS — Z83719 Family history of colon polyps, unspecified: Secondary | ICD-10-CM

## 2023-12-15 DIAGNOSIS — K5900 Constipation, unspecified: Secondary | ICD-10-CM | POA: Diagnosis present

## 2023-12-15 DIAGNOSIS — Z881 Allergy status to other antibiotic agents status: Secondary | ICD-10-CM

## 2023-12-15 DIAGNOSIS — Z818 Family history of other mental and behavioral disorders: Secondary | ICD-10-CM

## 2023-12-15 DIAGNOSIS — T50905A Adverse effect of unspecified drugs, medicaments and biological substances, initial encounter: Secondary | ICD-10-CM | POA: Diagnosis present

## 2023-12-15 DIAGNOSIS — I8500 Esophageal varices without bleeding: Secondary | ICD-10-CM | POA: Diagnosis not present

## 2023-12-15 DIAGNOSIS — E8770 Fluid overload, unspecified: Secondary | ICD-10-CM | POA: Diagnosis present

## 2023-12-15 DIAGNOSIS — E871 Hypo-osmolality and hyponatremia: Secondary | ICD-10-CM | POA: Diagnosis present

## 2023-12-15 DIAGNOSIS — K8020 Calculus of gallbladder without cholecystitis without obstruction: Secondary | ICD-10-CM | POA: Diagnosis present

## 2023-12-15 DIAGNOSIS — K766 Portal hypertension: Secondary | ICD-10-CM | POA: Diagnosis present

## 2023-12-15 DIAGNOSIS — K7581 Nonalcoholic steatohepatitis (NASH): Secondary | ICD-10-CM | POA: Diagnosis present

## 2023-12-15 DIAGNOSIS — E1140 Type 2 diabetes mellitus with diabetic neuropathy, unspecified: Secondary | ICD-10-CM | POA: Diagnosis present

## 2023-12-15 DIAGNOSIS — Z833 Family history of diabetes mellitus: Secondary | ICD-10-CM

## 2023-12-15 DIAGNOSIS — Z880 Allergy status to penicillin: Secondary | ICD-10-CM

## 2023-12-15 DIAGNOSIS — R188 Other ascites: Secondary | ICD-10-CM | POA: Diagnosis present

## 2023-12-15 DIAGNOSIS — Z794 Long term (current) use of insulin: Secondary | ICD-10-CM

## 2023-12-15 DIAGNOSIS — E118 Type 2 diabetes mellitus with unspecified complications: Secondary | ICD-10-CM

## 2023-12-15 DIAGNOSIS — K219 Gastro-esophageal reflux disease without esophagitis: Secondary | ICD-10-CM | POA: Diagnosis present

## 2023-12-15 DIAGNOSIS — Z9079 Acquired absence of other genital organ(s): Secondary | ICD-10-CM

## 2023-12-15 DIAGNOSIS — Z8261 Family history of arthritis: Secondary | ICD-10-CM

## 2023-12-15 DIAGNOSIS — Z91040 Latex allergy status: Secondary | ICD-10-CM

## 2023-12-15 DIAGNOSIS — I10 Essential (primary) hypertension: Secondary | ICD-10-CM | POA: Diagnosis present

## 2023-12-15 DIAGNOSIS — E1165 Type 2 diabetes mellitus with hyperglycemia: Secondary | ICD-10-CM | POA: Diagnosis present

## 2023-12-15 DIAGNOSIS — Z8701 Personal history of pneumonia (recurrent): Secondary | ICD-10-CM

## 2023-12-15 DIAGNOSIS — K5903 Drug induced constipation: Secondary | ICD-10-CM | POA: Diagnosis present

## 2023-12-15 DIAGNOSIS — Z8616 Personal history of COVID-19: Secondary | ICD-10-CM

## 2023-12-15 DIAGNOSIS — I8510 Secondary esophageal varices without bleeding: Secondary | ICD-10-CM | POA: Diagnosis present

## 2023-12-15 DIAGNOSIS — Z90710 Acquired absence of both cervix and uterus: Secondary | ICD-10-CM

## 2023-12-15 DIAGNOSIS — Z8052 Family history of malignant neoplasm of bladder: Secondary | ICD-10-CM

## 2023-12-15 DIAGNOSIS — Z8719 Personal history of other diseases of the digestive system: Secondary | ICD-10-CM

## 2023-12-15 DIAGNOSIS — Z79899 Other long term (current) drug therapy: Secondary | ICD-10-CM

## 2023-12-15 HISTORY — PX: IR PARACENTESIS: IMG2679

## 2023-12-15 LAB — COMPREHENSIVE METABOLIC PANEL WITH GFR
ALT: 27 U/L (ref 0–44)
AST: 47 U/L — ABNORMAL HIGH (ref 15–41)
Albumin: 2.9 g/dL — ABNORMAL LOW (ref 3.5–5.0)
Alkaline Phosphatase: 93 U/L (ref 38–126)
Anion gap: 13 (ref 5–15)
BUN: <5 mg/dL — ABNORMAL LOW (ref 6–20)
CO2: 19 mmol/L — ABNORMAL LOW (ref 22–32)
Calcium: 8.4 mg/dL — ABNORMAL LOW (ref 8.9–10.3)
Chloride: 99 mmol/L (ref 98–111)
Creatinine, Ser: 0.97 mg/dL (ref 0.44–1.00)
GFR, Estimated: >60 mL/min (ref >60)
Glucose, Bld: 146 mg/dL — ABNORMAL HIGH (ref 70–99)
Potassium: 3.6 mmol/L (ref 3.5–5.1)
Sodium: 131 mmol/L — ABNORMAL LOW (ref 135–145)
Total Bilirubin: 3.1 mg/dL — ABNORMAL HIGH (ref 0.0–1.2)
Total Protein: 7.1 g/dL (ref 6.5–8.1)

## 2023-12-15 LAB — CBC
HCT: 33.1 % — ABNORMAL LOW (ref 36.0–46.0)
Hemoglobin: 10.7 g/dL — ABNORMAL LOW (ref 12.0–15.0)
MCH: 29.9 pg (ref 26.0–34.0)
MCHC: 32.3 g/dL (ref 30.0–36.0)
MCV: 92.5 fL (ref 80.0–100.0)
Platelets: 85 K/uL — ABNORMAL LOW (ref 150–400)
RBC: 3.58 MIL/uL — ABNORMAL LOW (ref 3.87–5.11)
RDW: 15.2 % (ref 11.5–15.5)
WBC: 2.9 K/uL — ABNORMAL LOW (ref 4.0–10.5)
nRBC: 0 % (ref 0.0–0.2)

## 2023-12-15 LAB — URINALYSIS, ROUTINE W REFLEX MICROSCOPIC
Glucose, UA: NEGATIVE mg/dL
Hgb urine dipstick: NEGATIVE
Ketones, ur: NEGATIVE mg/dL
Leukocytes,Ua: NEGATIVE
Nitrite: NEGATIVE
Protein, ur: NEGATIVE mg/dL
Specific Gravity, Urine: >1.046 — ABNORMAL HIGH (ref 1.005–1.030)
pH: 8 (ref 5.0–8.0)

## 2023-12-15 LAB — BODY FLUID CELL COUNT WITH DIFFERENTIAL
Eos, Fluid: 0 %
Lymphs, Fluid: 46 %
Monocyte-Macrophage-Serous Fluid: 50 % (ref 50–90)
Neutrophil Count, Fluid: 4 % (ref 0–25)
Total Nucleated Cell Count, Fluid: 114 uL (ref 0–1000)

## 2023-12-15 LAB — I-STAT CHEM 8, ED
BUN: 4 mg/dL — ABNORMAL LOW (ref 6–20)
Calcium, Ion: 1.02 mmol/L — ABNORMAL LOW (ref 1.15–1.40)
Chloride: 100 mmol/L (ref 98–111)
Creatinine, Ser: 0.9 mg/dL (ref 0.44–1.00)
Glucose, Bld: 139 mg/dL — ABNORMAL HIGH (ref 70–99)
HCT: 33 % — ABNORMAL LOW (ref 36.0–46.0)
Hemoglobin: 11.2 g/dL — ABNORMAL LOW (ref 12.0–15.0)
Potassium: 3.7 mmol/L (ref 3.5–5.1)
Sodium: 135 mmol/L (ref 135–145)
TCO2: 18 mmol/L — ABNORMAL LOW (ref 22–32)

## 2023-12-15 LAB — LIPASE, BLOOD: Lipase: 47 U/L (ref 11–51)

## 2023-12-15 LAB — GRAM STAIN: Gram Stain: NONE SEEN

## 2023-12-15 LAB — I-STAT CG4 LACTIC ACID, ED
Lactic Acid, Venous: 3 mmol/L (ref 0.5–1.9)
Lactic Acid, Venous: 1.5 mmol/L (ref 0.5–1.9)

## 2023-12-15 LAB — C-REACTIVE PROTEIN: CRP: 0.7 mg/dL (ref <1.0)

## 2023-12-15 LAB — BRAIN NATRIURETIC PEPTIDE: B Natriuretic Peptide: 15.8 pg/mL (ref 0.0–100.0)

## 2023-12-15 LAB — GLUCOSE, CAPILLARY
Glucose-Capillary: 143 mg/dL — ABNORMAL HIGH (ref 70–99)
Glucose-Capillary: 297 mg/dL — ABNORMAL HIGH (ref 70–99)

## 2023-12-15 LAB — AMMONIA: Ammonia: 73 umol/L — ABNORMAL HIGH (ref 9–35)

## 2023-12-15 LAB — CBG MONITORING, ED: Glucose-Capillary: 138 mg/dL — ABNORMAL HIGH (ref 70–99)

## 2023-12-15 MED ORDER — INSULIN ASPART 100 UNIT/ML IJ SOLN
0.0000 [IU] | Freq: Every day | INTRAMUSCULAR | Status: DC
  Administered 2023-12-16 – 2023-12-17 (×2): 3 [IU] via SUBCUTANEOUS
  Administered 2023-12-18: 2 [IU] via SUBCUTANEOUS

## 2023-12-15 MED ORDER — SPIRONOLACTONE 100 MG PO TABS
300.0000 mg | ORAL_TABLET | Freq: Every day | ORAL | Status: DC
  Administered 2023-12-15 – 2023-12-19 (×5): 300 mg via ORAL
  Filled 2023-12-15 (×2): qty 12
  Filled 2023-12-15 (×5): qty 3
  Filled 2023-12-15 (×2): qty 12

## 2023-12-15 MED ORDER — ONDANSETRON HCL 4 MG/2ML IJ SOLN
4.0000 mg | Freq: Once | INTRAMUSCULAR | Status: AC
  Administered 2023-12-15: 4 mg via INTRAVENOUS
  Filled 2023-12-15: qty 2

## 2023-12-15 MED ORDER — LIDOCAINE-EPINEPHRINE 1 %-1:100000 IJ SOLN
20.0000 mL | Freq: Once | INTRAMUSCULAR | Status: AC
  Administered 2023-12-15: 10 mL via INTRADERMAL

## 2023-12-15 MED ORDER — SODIUM CHLORIDE 0.9 % IV BOLUS
1000.0000 mL | Freq: Once | INTRAVENOUS | Status: AC
  Administered 2023-12-15: 1000 mL via INTRAVENOUS

## 2023-12-15 MED ORDER — MORPHINE SULFATE (PF) 4 MG/ML IV SOLN
4.0000 mg | Freq: Once | INTRAVENOUS | Status: AC
  Administered 2023-12-15: 4 mg via INTRAVENOUS
  Filled 2023-12-15: qty 1

## 2023-12-15 MED ORDER — IOHEXOL 350 MG/ML SOLN
75.0000 mL | Freq: Once | INTRAVENOUS | Status: AC | PRN
  Administered 2023-12-15: 75 mL via INTRAVENOUS

## 2023-12-15 MED ORDER — ALBUTEROL SULFATE (2.5 MG/3ML) 0.083% IN NEBU: 2.5000 mg | INHALATION_SOLUTION | Freq: Four times a day (QID) | RESPIRATORY_TRACT | Status: DC | PRN

## 2023-12-15 MED ORDER — ACETAMINOPHEN 650 MG RE SUPP
650.0000 mg | Freq: Four times a day (QID) | RECTAL | Status: DC | PRN
Start: 2023-12-15 — End: 2023-12-19

## 2023-12-15 MED ORDER — LACTULOSE 10 GM/15ML PO SOLN
20.0000 g | Freq: Once | ORAL | Status: DC
  Filled 2023-12-15: qty 30

## 2023-12-15 MED ORDER — INSULIN ASPART 100 UNIT/ML IJ SOLN
30.0000 [IU] | Freq: Three times a day (TID) | INTRAMUSCULAR | Status: DC
  Administered 2023-12-16 – 2023-12-19 (×5): 30 [IU] via SUBCUTANEOUS

## 2023-12-15 MED ORDER — INSULIN ASPART 100 UNIT/ML IJ SOLN
20.0000 [IU] | Freq: Once | INTRAMUSCULAR | Status: AC
  Administered 2023-12-15: 20 [IU] via SUBCUTANEOUS

## 2023-12-15 MED ORDER — CALCIUM CARBONATE ANTACID 500 MG PO CHEW: 2.0000 | CHEWABLE_TABLET | ORAL | Status: DC | PRN

## 2023-12-15 MED ORDER — FUROSEMIDE 40 MG PO TABS
80.0000 mg | ORAL_TABLET | Freq: Every day | ORAL | Status: DC
  Administered 2023-12-15 – 2023-12-19 (×5): 80 mg via ORAL
  Filled 2023-12-15 (×2): qty 2
  Filled 2023-12-15: qty 4
  Filled 2023-12-15 (×2): qty 2

## 2023-12-15 MED ORDER — LACTULOSE 10 GM/15ML PO SOLN
20.0000 g | Freq: Two times a day (BID) | ORAL | Status: DC
  Administered 2023-12-15 – 2023-12-18 (×6): 20 g via ORAL
  Filled 2023-12-15 (×6): qty 30

## 2023-12-15 MED ORDER — INSULIN ASPART 100 UNIT/ML IJ SOLN
0.0000 [IU] | Freq: Three times a day (TID) | INTRAMUSCULAR | Status: DC
  Administered 2023-12-15: 3 [IU] via SUBCUTANEOUS
  Administered 2023-12-15: 11 [IU] via SUBCUTANEOUS
  Administered 2023-12-16 (×2): 4 [IU] via SUBCUTANEOUS
  Administered 2023-12-17 (×2): 11 [IU] via SUBCUTANEOUS
  Administered 2023-12-17: 3 [IU] via SUBCUTANEOUS
  Administered 2023-12-18: 15 [IU] via SUBCUTANEOUS
  Administered 2023-12-18: 3 [IU] via SUBCUTANEOUS
  Administered 2023-12-19: 7 [IU] via SUBCUTANEOUS

## 2023-12-15 MED ORDER — DICYCLOMINE HCL 10 MG/ML IM SOLN
20.0000 mg | Freq: Once | INTRAMUSCULAR | Status: AC
  Administered 2023-12-15: 20 mg via INTRAMUSCULAR
  Filled 2023-12-15: qty 2

## 2023-12-15 MED ORDER — MIDODRINE HCL 5 MG PO TABS
5.0000 mg | ORAL_TABLET | Freq: Every day | ORAL | Status: DC
  Administered 2023-12-15 – 2023-12-16 (×2): 5 mg via ORAL
  Filled 2023-12-15 (×2): qty 1

## 2023-12-15 MED ORDER — LIDOCAINE-EPINEPHRINE 1 %-1:100000 IJ SOLN
INTRAMUSCULAR | Status: AC
  Filled 2023-12-15: qty 1

## 2023-12-15 MED ORDER — TRIMETHOBENZAMIDE HCL 100 MG/ML IM SOLN: 200.0000 mg | Freq: Four times a day (QID) | INTRAMUSCULAR | Status: DC | PRN

## 2023-12-15 MED ORDER — DIPHENHYDRAMINE HCL 50 MG/ML IJ SOLN
25.0000 mg | Freq: Four times a day (QID) | INTRAMUSCULAR | Status: DC | PRN
  Administered 2023-12-15 – 2023-12-17 (×3): 25 mg via INTRAVENOUS
  Filled 2023-12-15 (×3): qty 1

## 2023-12-15 MED ORDER — ACETAMINOPHEN 325 MG PO TABS
650.0000 mg | ORAL_TABLET | Freq: Four times a day (QID) | ORAL | Status: DC | PRN
  Filled 2023-12-15: qty 2

## 2023-12-15 MED ORDER — PANTOPRAZOLE SODIUM 40 MG PO TBEC
40.0000 mg | DELAYED_RELEASE_TABLET | Freq: Every day | ORAL | Status: DC
  Administered 2023-12-15 – 2023-12-19 (×5): 40 mg via ORAL
  Filled 2023-12-15 (×5): qty 1

## 2023-12-15 MED ORDER — MONTELUKAST SODIUM 10 MG PO TABS
10.0000 mg | ORAL_TABLET | Freq: Every day | ORAL | Status: DC
  Administered 2023-12-15 – 2023-12-18 (×4): 10 mg via ORAL
  Filled 2023-12-15 (×4): qty 1

## 2023-12-15 MED ORDER — RIFAXIMIN 550 MG PO TABS
550.0000 mg | ORAL_TABLET | Freq: Every morning | ORAL | Status: DC
  Administered 2023-12-15 – 2023-12-19 (×5): 550 mg via ORAL
  Filled 2023-12-15 (×5): qty 1

## 2023-12-15 MED ORDER — SODIUM CHLORIDE 0.9% FLUSH
3.0000 mL | Freq: Two times a day (BID) | INTRAVENOUS | Status: DC
  Administered 2023-12-15 – 2023-12-19 (×9): 3 mL via INTRAVENOUS

## 2023-12-15 NOTE — Consult Note (Signed)
 Consultation Note   Referring Provider: [facility]  Hospitalist PCP: [name], MD Primary Gastroenterologist:  Victory Brand, MD / [facility]       Reason for Consultation: Hepatic encephalopathy DOA: 12/15/2023         Hospital Day: 1   ASSESSMENT    43 y.o. year old female with decompensated cirrhosis with esophageal varices, ascites requiring serial LVP's, and hepatic encephalopathy.  Admitted today for recurrent hepatic encephalopathy and also upper abdominal pain of Patient is somewhat sleepy on my exam which is not unusual for her.  She is alert and oriented and without asterixis.  Etiology of acute abdominal pain is unclear at this point.  She feels like ascites has reaccumulated since her last LVP on Friday.  Interestingly there is no ascites on CT scan .   MELD 3.0: 22 at 11/22/2023  5:19 AM  Nonproductive cough No acute findings on chest x-ray  Cholelithiasis Asymptomatic (is seems) at this point  Principal Problem:   Cirrhosis of liver (HCC)     PLAN:   --Will try to get a  diagnostic tap if enough fluid present --Continue home diuretics, monitor renal function -- 2 g sodium restricted diet -- Continue Xifaxan  twice daily -- Lactulose  20 g twice daily -- Pantoprazole  40 mg daily  HPI   Laurie Sutton is followed by us  for history of NASH cirrhosis.  She is also followed by [facility] in [place] and  [facility] in [place]..  She has had a few recent admissions for hepatic encephalopathy, infectious colitis, and rectal bleeding which turned out to be hemorrhoids.   Laurie Sutton has been getting large-volume paracenteses about every 7 to 10 days.  She was recently in [place] to fill out paperwork in regards to being listed for liver transplant.  She is to return to [place] next Thursday to meet with a dietitian and nurse.   Laurie Sutton had a large-volume paracentesis done on Friday.  Procedure was  uneventful though she has not felt well since.  Last night she developed shortness of breath and felt like her abdomen had filled back up with ascites and was being shoved upward into her chest.  She was having stabbing pain in her abdomen.  Her temp was 100.6.  She has chronic nausea which is unchanged.  She has not had any urinary symptoms.  She has had a nonproductive cough since Saturday.  Tells me she is taking lactulose   (I do not see it on home med list ?) . She is having 3-4 bowel movements a day.  No blood in stool.  No black stools. She is compliant with 2 g sodium restricted diet and diuretics.  Data Reviewed:  VSS LFTs are at baseline.  Bilirubin 3.1.  BUN 4 , creatinine 0.9 . BNP is normal.  Lactic acid elevated at 3.  CBC is at baseline with WBC of 2.9 , hemoglobin 10.7 , platelets 85 .    INR 2.0 Ammonia level is elevated at 73  UA is not suspicious Chest x-ray unremarkable although limited view of bases due to low inspiration  Labs and Imaging:  Recent Labs    12/15/23 0354  PROT 7.1  ALBUMIN  2.9*  AST 47*  ALT 27  ALKPHOS 93  BILITOT 3.1*   Recent Labs    12/15/23 0354 12/15/23 0359  WBC 2.9*  --   HGB 10.7* 11.2*  HCT 33.1* 33.0*  MCV 92.5  --   PLT 85*  --    Recent Labs    12/15/23 0354 12/15/23 0359  NA 131* 135  K 3.6 3.7  CL 99 100  CO2 19*  --   GLUCOSE 146* 139*  BUN <5* 4*  CREATININE 0.97 0.90  CALCIUM  8.4*  --      CT ABDOMEN PELVIS W CONTRAST EXAM: CT ABDOMEN AND PELVIS WITH CONTRAST 12/15/2023 05:22:57 AM  TECHNIQUE: CT of the abdomen and pelvis was performed with the administration of intravenous contrast. Multiplanar reformatted images are provided for review. Automated exposure control, iterative reconstruction, and/or weight-based adjustment of the mA/kV was utilized to reduce the radiation dose to as low as reasonably achievable.  COMPARISON: CT of the abdomen and pelvis dated 11/18/2023.  CLINICAL HISTORY: Abdominal  pain, acute, nonlocalized; abdominal pain in setting of liver failure patient with ascites. 75cc [id number]; Pt states that she is in liver failure and is on liver transplant list. Pt reports that she had paracentesis done on Friday and hasn't felt well since then. Pt reports increased SOB that began last night. Pt is jaundice and appears tachypnic.  FINDINGS:  LOWER CHEST: Minimal dependent atelectasis within the lower lobes.  LIVER: The liver demonstrates cirrhotic morphology. No lesions are evident.  GALLBLADDER AND BILE DUCTS: Multiple stones are seen within the gallbladder. No biliary ductal dilatation.  SPLEEN: The spleen is moderately enlarged, measuring approximately 17 cm in length. No focal lesion.  PANCREAS: No mass. No ductal dilatation.  ADRENAL GLANDS: Normal appearance. No mass.  KIDNEYS, URETERS AND BLADDER: No stones in the kidneys or ureters. No hydronephrosis. No perinephric or periureteral stranding. Urinary bladder is unremarkable.  GI AND BOWEL: Stomach demonstrates no acute abnormality. There is no bowel obstruction. No bowel wall thickening.  PERITONEUM AND RETROPERITONEUM: No ascites. No free air.  VASCULATURE: The main portal vein is abnormally dilated, measuring up to 2.3 cm in diameter.  LYMPH NODES: No lymphadenopathy.  REPRODUCTIVE ORGANS: The patient is status post hysterectomy.  BONES AND SOFT TISSUES: No acute osseous abnormality. There is a small periumbilical hernia.  IMPRESSION: 1. Cirrhotic liver morphology with abnormally dilated main portal vein (up to 2.3 cm) and moderately enlarged spleen (approximately 17 cm). 2. Multiple stones in the gallbladder. 3. Small periumbilical hernia.  Electronically signed by: Evalene Coho MD 12/15/2023 05:34 AM EDT RP Workstation: [id number]    Past Medical History:  Diagnosis Date   ADHD    Allergy    Anemia    IRON TRANSFUSION 07-2020 NONE SINCE   Anxiety    Back pain    COVID  08/12/2019   ALL SYMPTOMS REOLVED PER PT   Depression    Diabetic neuropathy (HCC) 11/11/2020   FEET   dm type 2    sees Dr. Odella Jacobson at [facility]   Fatty liver    GERD (gastroesophageal reflux disease)    History of kidney stones    Joint pain    Menorrhagia 11/11/2020   Migraines    Murmur, cardiac    FAINT NO CARDIOLOGIST   Other fatigue    Pneumonia 08/12/2019   COVID PNEUMONIA ALL SYMPTOMS RESOLVED PER PT   Shortness of breath on exertion     Past Surgical History:  Procedure Laterality Date   CESAREAN  SECTION  12/13/2006   COLONOSCOPY WITH PROPOFOL  N/A 08/02/2023   Procedure: COLONOSCOPY WITH PROPOFOL ;  Surgeon: Legrand Victory LITTIE DOUGLAS, MD;  Location: [facility];  Service: Gastroenterology;  Laterality: N/A;   ESOPHAGOGASTRODUODENOSCOPY (EGD) WITH PROPOFOL  N/A 08/02/2023   Procedure: ESOPHAGOGASTRODUODENOSCOPY (EGD) WITH PROPOFOL ;  Surgeon: Legrand Victory LITTIE DOUGLAS, MD;  Location: [facility];  Service: Gastroenterology;  Laterality: N/A;   EXTRACORPOREAL SHOCK WAVE LITHOTRIPSY  2018   FLEXIBLE SIGMOIDOSCOPY N/A 11/20/2023   Procedure: KINGSTON SIDE;  Surgeon: Albertus Gordy HERO, MD;  Location: [facility];  Service: Gastroenterology;  Laterality: N/A;   FOOT SURGERY Right 10/02/2020   RIGHT FOOT HEEL AND TOES, SURGICAL CENTER OFF [address]   IR PARACENTESIS  07/06/2023   IR PARACENTESIS  08/18/2023   IR PARACENTESIS  10/05/2023   IR PARACENTESIS  10/11/2023   IR PARACENTESIS  10/20/2023   IR PARACENTESIS  11/08/2023   IR PARACENTESIS  11/28/2023   IR PARACENTESIS  12/09/2023   IR TRANSCATHETER BX  07/26/2023   IR US  GUIDE VASC ACCESS RIGHT  07/26/2023   IR VENOGRAM HEPATIC W HEMODYNAMIC EVALUATION  07/26/2023   LAPAROSCOPIC VAGINAL HYSTERECTOMY WITH SALPINGECTOMY Bilateral 11/17/2020   Procedure: LAPAROSCOPIC ASSISTED VAGINAL HYSTERECTOMY WITH BILATERAL SALPINGECTOMY;  Surgeon: Dannielle Bouchard, DO;  Location: [facility];  Service: Gynecology;  Laterality:  Bilateral;   POLYPECTOMY  08/02/2023   Procedure: POLYPECTOMY, INTESTINE;  Surgeon: Legrand Victory LITTIE DOUGLAS, MD;  Location: [facility];  Service: Gastroenterology;;    Family History  Problem Relation Age of Onset   Colon polyps Mother    Depression Mother    Anxiety disorder Mother    Colon polyps Father    Sleep apnea Father    Anxiety disorder Father    Depression Father    Diabetes Father    Bladder Cancer Father 24   Rheum arthritis Father    Migraines Maternal Aunt    Migraines Maternal Grandmother    Diabetes Maternal Grandfather    Healthy Daughter    Asthma Daughter    Anxiety disorder Daughter    Anxiety disorder Son    Asthma Son    Healthy Son    Cerebral aneurysm Cousin    Heart disease Other    Depression Other    Anxiety disorder Other    Sleep apnea Other    Colon cancer Neg Hx    Esophageal cancer Neg Hx    Stomach cancer Neg Hx    Rectal cancer Neg Hx     Prior to Admission medications   Medication Sig Start Date End Date Taking? Authorizing Provider  acetaminophen  (TYLENOL ) 325 MG tablet Take 2 tablets (650 mg total) by mouth every 6 (six) hours as needed for mild pain (pain score 1-3) or fever (or Fever >/= 101). 10/07/23  Yes Sheikh, Omair Latif, DO  albuterol  (VENTOLIN  HFA) 108 [phone] Base) MCG/ACT inhaler INHALE 2 PUFFS INTO THE LUNGS UP TO EVERY 6 HOURS AS NEEDED FOR WHEEZING/SHORTNESS OF BREATH 08/20/22  Yes [name], MD  calcium  carbonate (TUMS - DOSED IN MG ELEMENTAL CALCIUM ) 500 MG chewable tablet Chew 2 tablets by mouth as needed for indigestion or heartburn.   Yes [provider]  clotrimazole -betamethasone  (LOTRISONE ) cream APPLY 1 APPLICATION TOPICALLY TWICE A DAY AS NEEDED 05/26/23  Yes [name], MD  Continuous Glucose Sensor (DEXCOM G7 SENSOR) MISC Use 1 sensor for continuous glucose monitoring every 10 days for 30 days 05/25/23  Yes Braulio Hough, MD  FLUoxetine  (PROZAC )  40 MG capsule TAKE 1 CAPSULE BY MOUTH TWICE A DAY Patient  taking differently: Take 80 mg by mouth in the morning. 07/26/23  Yes [name], MD  furosemide  (LASIX ) 80 MG tablet Take 1 tablet (80 mg total) by mouth daily. 10/11/23  Yes Sheikh, Omair Latif, DO  HUMULIN  R U-500 KWIKPEN 500 UNIT/ML KwikPen Use 120 units at breakfast, 100 at lunch, and 100 at dinner Patient taking differently: Use 60 units at breakfast, 65 at lunch, and 65 at dinner 04/08/22  Yes [name], MD  hydrocortisone  2.5 % cream Use 3 (three) times daily rectally Patient taking differently: Apply 1 Application topically 3 (three) times daily as needed. 11/22/23  Yes Dennise Lavada POUR, MD  midodrine  (PROAMATINE ) 5 MG tablet Take 1 tablet (5 mg total) by mouth 2 (two) times daily with a meal. Patient taking differently: Take 5 mg by mouth daily. 11/22/23  Yes Dennise Lavada POUR, MD  montelukast  (SINGULAIR ) 10 MG tablet Take 1 tablet (10 mg total) by mouth at bedtime. 08/25/22  Yes [name], MD  omeprazole  (PRILOSEC) 40 MG capsule TAKE 1 CAPSULE (40 MG TOTAL) BY MOUTH IN THE MORNING 10/23/23  Yes Danis, Victory LITTIE MOULD, MD  ondansetron  (ZOFRAN ) 4 MG tablet Take 1 tablet (4 mg total) by mouth every 8 (eight) hours as needed for nausea or vomiting. 10/18/23  Yes Craig Alan SAUNDERS, PA-C  spironolactone  (ALDACTONE ) 100 MG tablet Take 3 tablets (300 mg total) by mouth daily. 11/01/23 04/11/24 Yes Sira, Zackery, MD  Vitamin D , Ergocalciferol , (DRISDOL ) 1.25 MG (50000 UNIT) CAPS capsule Take 50,000 Units by mouth every 7 (seven) days. No specific day 11/19/23  Yes [provider]  XIFAXAN  550 MG TABS tablet Take 550 mg by mouth in the morning. 10/27/23  Yes [provider]    Current Facility-Administered Medications  Medication Dose Route Frequency Provider Last Rate Last Admin   acetaminophen  (TYLENOL ) tablet 650 mg  650 mg Oral Q6H PRN Smith, Rondell A, MD       Or   acetaminophen  (TYLENOL ) suppository 650 mg  650 mg Rectal Q6H PRN Claudene Reeves A, MD       albuterol   (PROVENTIL ) (2.5 MG/3ML) 0.083% nebulizer solution 2.5 mg  2.5 mg Nebulization Q6H PRN Smith, Rondell A, MD       calcium  carbonate (TUMS - dosed in mg elemental calcium ) chewable tablet 400 mg of elemental calcium   2 tablet Oral PRN Smith, Rondell A, MD       furosemide  (LASIX ) tablet 80 mg  80 mg Oral Daily Claudene, Rondell A, MD   80 mg at 12/15/23 1138   insulin  aspart (novoLOG ) injection 0-20 Units  0-20 Units Subcutaneous TID WC Smith, Rondell A, MD       insulin  aspart (novoLOG ) injection 0-5 Units  0-5 Units Subcutaneous QHS Smith, Rondell A, MD       insulin  aspart (novoLOG ) injection 30 Units  30 Units Subcutaneous TID WC Smith, Rondell A, MD       lactulose  (CHRONULAC ) 10 GM/15ML solution 20 g  20 g Oral Once Groce, Christopher F, PA-C       midodrine  (PROAMATINE ) tablet 5 mg  5 mg Oral Daily Smith, Rondell A, MD   5 mg at 12/15/23 1139   montelukast  (SINGULAIR ) tablet 10 mg  10 mg Oral QHS Smith, Rondell A, MD       pantoprazole  (PROTONIX ) EC tablet 40 mg  40 mg Oral Daily Claudene Reeves LABOR, MD  40 mg at 12/15/23 1139   rifaximin  (XIFAXAN ) tablet 550 mg  550 mg Oral q AM Smith, Rondell A, MD   550 mg at 12/15/23 1139   sodium chloride  flush (NS) 0.9 % injection 3 mL  3 mL Intravenous Q12H Smith, Rondell A, MD   3 mL at 12/15/23 1141   spironolactone  (ALDACTONE ) tablet 300 mg  300 mg Oral Daily Smith, Rondell A, MD   300 mg at 12/15/23 1139   trimethobenzamide  (TIGAN ) injection 200 mg  200 mg Intramuscular Q6H PRN Claudene Maximino LABOR, MD       Current Outpatient Medications  Medication Sig Dispense Refill   acetaminophen  (TYLENOL ) 325 MG tablet Take 2 tablets (650 mg total) by mouth every 6 (six) hours as needed for mild pain (pain score 1-3) or fever (or Fever >/= 101). 20 tablet 0   albuterol  (VENTOLIN  HFA) 108 (90 Base) MCG/ACT inhaler INHALE 2 PUFFS INTO THE LUNGS UP TO EVERY 6 HOURS AS NEEDED FOR WHEEZING/SHORTNESS OF BREATH 18 each 0   calcium  carbonate (TUMS - DOSED IN MG ELEMENTAL  CALCIUM ) 500 MG chewable tablet Chew 2 tablets by mouth as needed for indigestion or heartburn.     clotrimazole -betamethasone  (LOTRISONE ) cream APPLY 1 APPLICATION TOPICALLY TWICE A DAY AS NEEDED 30 g 1   Continuous Glucose Sensor (DEXCOM G7 SENSOR) MISC Use 1 sensor for continuous glucose monitoring every 10 days for 30 days 3 each 5   FLUoxetine  (PROZAC ) 40 MG capsule TAKE 1 CAPSULE BY MOUTH TWICE A DAY (Patient taking differently: Take 80 mg by mouth in the morning.) 180 capsule 0   furosemide  (LASIX ) 80 MG tablet Take 1 tablet (80 mg total) by mouth daily. 30 tablet 0   HUMULIN  R U-500 KWIKPEN 500 UNIT/ML KwikPen Use 120 units at breakfast, 100 at lunch, and 100 at dinner (Patient taking differently: Use 60 units at breakfast, 65 at lunch, and 65 at dinner) 1 mL 0   hydrocortisone  2.5 % cream Use 3 (three) times daily rectally (Patient taking differently: Apply 1 Application topically 3 (three) times daily as needed.) 28.35 g 0   midodrine  (PROAMATINE ) 5 MG tablet Take 1 tablet (5 mg total) by mouth 2 (two) times daily with a meal. (Patient taking differently: Take 5 mg by mouth daily.) 60 tablet 0   montelukast  (SINGULAIR ) 10 MG tablet Take 1 tablet (10 mg total) by mouth at bedtime. 90 tablet 3   omeprazole  (PRILOSEC) 40 MG capsule TAKE 1 CAPSULE (40 MG TOTAL) BY MOUTH IN THE MORNING 90 capsule 0   ondansetron  (ZOFRAN ) 4 MG tablet Take 1 tablet (4 mg total) by mouth every 8 (eight) hours as needed for nausea or vomiting. 30 tablet 1   spironolactone  (ALDACTONE ) 100 MG tablet Take 3 tablets (300 mg total) by mouth daily. 90 tablet 0   Vitamin D , Ergocalciferol , (DRISDOL ) 1.25 MG (50000 UNIT) CAPS capsule Take 50,000 Units by mouth every 7 (seven) days. No specific day     XIFAXAN  550 MG TABS tablet Take 550 mg by mouth in the morning.      Allergies as of 12/15/2023 - Review Complete 12/15/2023  Allergen Reaction Noted   Vancomycin  Itching 10/04/2023   Amoxicillin Hives and Itching  02/16/2011   Azithromycin   07/25/2017   Dulaglutide  Other (See Comments) 08/21/2020   Empagliflozin -metformin  hcl er Other (See Comments) and Swelling 08/21/2020   Januvia  [sitagliptin ] Other (See Comments) 11/18/2014   Latex  07/25/2017   Metformin   12/16/2022   Penicillins Hives  02/16/2011   Vitamin k  and related Other (See Comments) 11/19/2023    Social History   Socioeconomic History   Marital status: Married    Spouse name: Adam   Number of children: 2   Years of education: Not on file   Highest education level: 12th grade  Occupational History   Occupation: Arts administrator and Nutrition    Employer: [employer]    Comment: works 20 hours per week  Tobacco Use   Smoking status: Never    Passive exposure: Current   Smokeless tobacco: Never  Vaping Use   Vaping status: Never Used  Substance and Sexual Activity   Alcohol use: No    Alcohol/week: 0.0 standard drinks of alcohol   Drug use: No   Sexual activity: Yes    Partners: Male    Birth control/protection: None  Other Topics Concern   Not on file  Social History Narrative   Not on file   Social Drivers of Health   Financial Resource Strain: Low Risk  (09/19/2022)   Overall Financial Resource Strain (CARDIA)    Difficulty of Paying Living Expenses: Not hard at all  Food Insecurity: No Food Insecurity (11/23/2023)   Hunger Vital Sign    Worried About Running Out of Food in the Last Year: Never true    Ran Out of Food in the Last Year: Never true  Transportation Needs: No Transportation Needs (11/23/2023)   PRAPARE - Administrator, Civil Service (Medical): No    Lack of Transportation (Non-Medical): No  Physical Activity: Insufficiently Active (06/04/2021)   Exercise Vital Sign    Days of Exercise per Week: 2 days    Minutes of Exercise per Session: 10 min  Stress: Stress Concern Present (09/19/2022)   [employer] of Occupational Health - Occupational Stress Questionnaire    Feeling of Stress  : To some extent  Social Connections: Socially Integrated (10/05/2023)   Social Connection and Isolation Panel    Frequency of Communication with Friends and Family: More than three times a week    Frequency of Social Gatherings with Friends and Family: Twice a week    Attends Religious Services: More than 4 times per year    Active Member of [employer] or Organizations: No    Attends Engineer, structural: More than 4 times per year    Marital Status: Married  Catering manager Violence: Not At Risk (11/23/2023)   Humiliation, Afraid, Rape, and Kick questionnaire    Fear of Current or Ex-Partner: No    Emotionally Abused: No    Physically Abused: No    Sexually Abused: No     Code Status   Code Status: Full Code  Review of Systems: All systems reviewed and negative except where noted in HPI.  Physical Exam: Vital signs in last 24 hours: Temp:  [98.6 F (37 C)] 98.6 F (37 C) (09/04 0900) Pulse Rate:  [93-99] 96 (09/04 0900) Resp:  [18-23] 20 (09/04 0900) BP: (99-110)/(44-64) 109/56 (09/04 0900) SpO2:  [96 %-100 %] 96 % (09/04 0900)    General:  Pleasant female in NAD Psych:  Cooperative. Normal mood and affect Eyes: Pupils equal Ears:  Normal auditory acuity Nose: No deformity, discharge or lesions Neck:  Supple, no masses felt Lungs:  Clear to auscultation.  Heart:  Regular rate, regular rhythm.  Abdomen:  Soft, moderately distended ,  nontender, active bowel sounds, no masses felt Rectal :  Deferred Msk: Symmetrical without gross deformities.  Neurologic:  A little sleepy , oriented , no asterixis  Extremities : No edema Skin:  Intact without significant lesions.    Intake/Output from previous day: No intake/output data recorded. Intake/Output this shift:  No intake/output data recorded.   [name], NP-C   12/15/2023, 12:22 PM

## 2023-12-15 NOTE — ED Triage Notes (Signed)
 Pt states that she is in liver failure and is on liver transplant list. Pt reports that she had paracentesis done on Friday and hasn't felt well since then. Pt reports increased SOB that began last night. Pt is jaundice and appears tachypnic

## 2023-12-15 NOTE — ED Provider Notes (Signed)
 [facility] Provider Note   CSN: [id number] Arrival date & time: 12/15/23  0340     Patient presents with: Shortness of Breath and Abdominal Pain   Laurie Sutton is a 43 y.o. female with history of GERD, diabetes, migraine headaches, low back pain, rectal bleeding, chronic constipation, portal hypertensive gastropathy, acute metabolic encephalopathy, decompensated hepatic cirrhosis, NASH, cirrhosis, SBP.  Patient presents to ED for evaluation of abdominal pain, shortness of breath.  Patient reports she is currently on transplant list awaiting liver transplant.  Reports she has liver failure secondary to NASH.  States that this past Thursday she was in [place] discussing transplant options with her transplant team.  States that on Friday she had a paracentesis done here in [place].  States that she has paracentesis done every other week.  Reports that she has not been feeling well since having paracentesis.  States that whenever she lays flat to sleep she feels like I am being smothered.  States that she becomes short of breath.  States this progressively worsened since Friday.  She denies any chest pain.  Endorsing nausea with vomiting but denies blood in her vomit.  Denies any blood in stool.  States that she had loose stools secondary to lactulose  today and yesterday which is baseline.  She endorses abdominal pain described as cramping and she does arrive with a distended abdomen.  She denies any leg swelling.  She denies any fevers at home.  She was admitted to the hospital here in [facility] on 8/8 for rectal bleeding.   Shortness of Breath Associated symptoms: abdominal pain   Abdominal Pain Associated symptoms: shortness of breath        Prior to Admission medications   Medication Sig Start Date End Date Taking? Authorizing Provider  acetaminophen  (TYLENOL ) 325 MG tablet Take 2 tablets (650 mg total) by mouth every 6 (six) hours as  needed for mild pain (pain score 1-3) or fever (or Fever >/= 101). 10/07/23  Yes Sheikh, Omair Latif, DO  albuterol  (VENTOLIN  HFA) 108 [phone] Base) MCG/ACT inhaler INHALE 2 PUFFS INTO THE LUNGS UP TO EVERY 6 HOURS AS NEEDED FOR WHEEZING/SHORTNESS OF BREATH 08/20/22  Yes [name], MD  calcium  carbonate (TUMS - DOSED IN MG ELEMENTAL CALCIUM ) 500 MG chewable tablet Chew 2 tablets by mouth as needed for indigestion or heartburn.   Yes [provider]  clotrimazole -betamethasone  (LOTRISONE ) cream APPLY 1 APPLICATION TOPICALLY TWICE A DAY AS NEEDED 05/26/23  Yes [name], MD  Continuous Glucose Sensor (DEXCOM G7 SENSOR) MISC Use 1 sensor for continuous glucose monitoring every 10 days for 30 days 05/25/23  Yes Braulio Hough, MD  FLUoxetine  (PROZAC ) 40 MG capsule TAKE 1 CAPSULE BY MOUTH TWICE A DAY Patient taking differently: Take 80 mg by mouth in the morning. 07/26/23  Yes [name], MD  furosemide  (LASIX ) 80 MG tablet Take 1 tablet (80 mg total) by mouth daily. 10/11/23  Yes Sheikh, Omair Latif, DO  HUMULIN  R U-500 KWIKPEN 500 UNIT/ML KwikPen Use 120 units at breakfast, 100 at lunch, and 100 at dinner Patient taking differently: Use 60 units at breakfast, 65 at lunch, and 65 at dinner 04/08/22  Yes [name], MD  hydrocortisone  2.5 % cream Use 3 (three) times daily rectally Patient taking differently: Apply 1 Application topically 3 (three) times daily as needed. 11/22/23  Yes Dennise Lavada POUR, MD  midodrine  (PROAMATINE ) 5 MG tablet Take 1 tablet (5  mg total) by mouth 2 (two) times daily with a meal. Patient taking differently: Take 5 mg by mouth daily. 11/22/23  Yes Dennise Lavada POUR, MD  montelukast  (SINGULAIR ) 10 MG tablet Take 1 tablet (10 mg total) by mouth at bedtime. 08/25/22  Yes [name], MD  omeprazole  (PRILOSEC) 40 MG capsule TAKE 1 CAPSULE (40 MG TOTAL) BY MOUTH IN THE MORNING 10/23/23  Yes Danis, Victory LITTIE MOULD, MD  ondansetron  (ZOFRAN ) 4 MG tablet Take 1 tablet (4 mg  total) by mouth every 8 (eight) hours as needed for nausea or vomiting. 10/18/23  Yes Craig Palma R, PA-C  spironolactone  (ALDACTONE ) 100 MG tablet Take 3 tablets (300 mg total) by mouth daily. 11/01/23 04/11/24 Yes Sira, Zackery, MD  Vitamin D , Ergocalciferol , (DRISDOL ) 1.25 MG (50000 UNIT) CAPS capsule Take 50,000 Units by mouth every 7 (seven) days. No specific day 11/19/23  Yes [provider]  XIFAXAN  550 MG TABS tablet Take 550 mg by mouth in the morning. 10/27/23  Yes [provider]    Allergies: Vancomycin , Amoxicillin, Azithromycin , Dulaglutide , Empagliflozin -metformin  hcl er, Januvia  [sitagliptin ], Latex, Metformin , Penicillins, and Vitamin k  and related    Review of Systems  Respiratory:  Positive for shortness of breath.   Gastrointestinal:  Positive for abdominal pain.  All other systems reviewed and are negative.   Updated Vital Signs BP (!) 100/52   Pulse 96   Temp 98.6 F (37 C)   Resp 18   LMP 11/13/2020 (Exact Date)   SpO2 99%   Physical Exam Vitals and nursing note reviewed.  Constitutional:      General: She is not in acute distress.    Appearance: She is well-developed.  HENT:     Head: Normocephalic and atraumatic.  Eyes:     Conjunctiva/sclera: Conjunctivae normal.  Cardiovascular:     Rate and Rhythm: Normal rate and regular rhythm.     Heart sounds: No murmur heard. Pulmonary:     Effort: Pulmonary effort is normal. No respiratory distress.     Breath sounds: Normal breath sounds.  Abdominal:     General: There is distension.     Palpations: Abdomen is soft.     Tenderness: There is abdominal tenderness.  Musculoskeletal:        General: No swelling.     Cervical back: Neck supple.     Comments: Asterixis present  Skin:    General: Skin is warm and dry.     Capillary Refill: Capillary refill takes less than 2 seconds.  Neurological:     Mental Status: She is alert.  Psychiatric:        Mood and Affect: Mood normal.      (all labs ordered are listed, but only abnormal results are displayed) Labs Reviewed  COMPREHENSIVE METABOLIC PANEL WITH GFR - Abnormal; Notable for the following components:      Result Value   Sodium 131 (*)    CO2 19 (*)    Glucose, Bld 146 (*)    BUN <5 (*)    Calcium  8.4 (*)    Albumin  2.9 (*)    AST 47 (*)    Total Bilirubin 3.1 (*)    All other components within normal limits  CBC - Abnormal; Notable for the following components:   WBC 2.9 (*)    RBC 3.58 (*)    Hemoglobin 10.7 (*)    HCT 33.1 (*)    Platelets 85 (*)    All other components within normal  limits  AMMONIA - Abnormal; Notable for the following components:   Ammonia 73 (*)    All other components within normal limits  I-STAT CHEM 8, ED - Abnormal; Notable for the following components:   BUN 4 (*)    Glucose, Bld 139 (*)    Calcium , Ion 1.02 (*)    TCO2 18 (*)    Hemoglobin 11.2 (*)    HCT 33.0 (*)    All other components within normal limits  I-STAT CG4 LACTIC ACID, ED - Abnormal; Notable for the following components:   Lactic Acid, Venous 3.0 (*)    All other components within normal limits  CULTURE, BLOOD (ROUTINE X 2)  CULTURE, BLOOD (ROUTINE X 2)  LIPASE, BLOOD  BRAIN NATRIURETIC PEPTIDE  URINALYSIS, ROUTINE W REFLEX MICROSCOPIC    EKG: EKG Interpretation Date/Time:  Thursday December 15 2023 04:01:24 EDT Ventricular Rate:  96 PR Interval:  119 QRS Duration:  93 QT Interval:  422 QTC Calculation: 534 R Axis:   -19  Text Interpretation: Sinus rhythm Borderline short PR interval Left ventricular hypertrophy Borderline T abnormalities, diffuse leads Prolonged QT interval Confirmed by Griselda Norris [phone]) on 12/15/2023 4:22:42 AM  Radiology: CT ABDOMEN PELVIS W CONTRAST Result Date: 12/15/2023 EXAM: CT ABDOMEN AND PELVIS WITH CONTRAST 12/15/2023 05:22:57 AM TECHNIQUE: CT of the abdomen and pelvis was performed with the administration of intravenous contrast. Multiplanar reformatted  images are provided for review. Automated exposure control, iterative reconstruction, and/or weight-based adjustment of the mA/kV was utilized to reduce the radiation dose to as low as reasonably achievable. COMPARISON: CT of the abdomen and pelvis dated 11/18/2023. CLINICAL HISTORY: Abdominal pain, acute, nonlocalized; abdominal pain in setting of liver failure patient with ascites. 75cc [id number]; Pt states that she is in liver failure and is on liver transplant list. Pt reports that she had paracentesis done on Friday and hasn't felt well since then. Pt reports increased SOB that began last night. Pt is jaundice and appears tachypnic. FINDINGS: LOWER CHEST: Minimal dependent atelectasis within the lower lobes. LIVER: The liver demonstrates cirrhotic morphology. No lesions are evident. GALLBLADDER AND BILE DUCTS: Multiple stones are seen within the gallbladder. No biliary ductal dilatation. SPLEEN: The spleen is moderately enlarged, measuring approximately 17 cm in length. No focal lesion. PANCREAS: No mass. No ductal dilatation. ADRENAL GLANDS: Normal appearance. No mass. KIDNEYS, URETERS AND BLADDER: No stones in the kidneys or ureters. No hydronephrosis. No perinephric or periureteral stranding. Urinary bladder is unremarkable. GI AND BOWEL: Stomach demonstrates no acute abnormality. There is no bowel obstruction. No bowel wall thickening. PERITONEUM AND RETROPERITONEUM: No ascites. No free air. VASCULATURE: The main portal vein is abnormally dilated, measuring up to 2.3 cm in diameter. LYMPH NODES: No lymphadenopathy. REPRODUCTIVE ORGANS: The patient is status post hysterectomy. BONES AND SOFT TISSUES: No acute osseous abnormality. There is a small periumbilical hernia. IMPRESSION: 1. Cirrhotic liver morphology with abnormally dilated main portal vein (up to 2.3 cm) and moderately enlarged spleen (approximately 17 cm). 2. Multiple stones in the gallbladder. 3. Small periumbilical hernia. Electronically signed  by: Evalene Coho MD 12/15/2023 05:34 AM EDT RP Workstation: [id number]   DG Chest Portable 1 View Result Date: 12/15/2023 CLINICAL DATA:  Shortness of breath. History of liver failure requiring frequent paracentesis most recently 6 days ago. EXAM: PORTABLE CHEST 1 VIEW COMPARISON:  AP Lat chest 10/10/2023 FINDINGS: The heart size and mediastinal contours are within normal limits. Both lungs are clear with limited view of the bases due to low inspiration.  The visualized skeletal structures are unremarkable. IMPRESSION: No active disease. Limited view of the bases due to low inspiration. Electronically Signed   By: Francis Quam M.D.   On: 12/15/2023 04:55    Procedures   Medications Ordered in the ED  lactulose  (CHRONULAC ) 10 GM/15ML solution 20 g (has no administration in time range)  morphine  (PF) 4 MG/ML injection 4 mg (has no administration in time range)  dicyclomine  (BENTYL ) injection 20 mg (20 mg Intramuscular Given 12/15/23 0431)  ondansetron  (ZOFRAN ) injection 4 mg (4 mg Intravenous Given 12/15/23 0432)  sodium chloride  0.9 % bolus 1,000 mL (1,000 mLs Intravenous New Bag/Given 12/15/23 0440)  iohexol  (OMNIPAQUE ) 350 MG/ML injection 75 mL (75 mLs Intravenous Contrast Given 12/15/23 0523)    Clinical Course as of 12/15/23 0618  Thu Dec 15, 2023  0446 Admit for Stage II hepatic encephalopathy (?) [CG]    Clinical Course User Index [CG] Ruthell Lonni FALCON, PA-C   Medical Decision Making Amount and/or Complexity of Data Reviewed Labs: ordered. Radiology: ordered.  Risk Prescription drug management. Decision regarding hospitalization.   This is a 43 year old female with history of cirrhosis secondary to NASH who presents to ED complaining of abdominal distention, shortness of breath.  On examination, she has slightly low blood pressure.  She is afebrile and nontachycardic.  Her lung sounds are clear bilaterally, she has not hypoxic.  Abdomen has tenderness, distention throughout.   Neuroexam at baseline.  Asterixis is present.  Patient evaluated utilizing CBC, CMP, ammonia, i-STAT Chem-8, i-STAT lactic acid x 2, BNP, lipase, blood cultures, chest x-ray, EKG and CT abdomen pelvis.  CBC without leukocytosis, baseline hemoglobin.  Patient metabolic panel with sodium 131, total bilirubin 3.1, AST 47.  Albumin  2.9.  Patient ammonia 73 given 20 g of lactulose .  Lipase 47.  BNP not elevated so doubt CHF.  Lactic acid elevated to 3.  Given IV fluid.  Patient chest x-ray shows no pleural effusions.  CT scan of abdomen and pelvis shows morphologic changes consistent with cirrhosis and enlarged spleen which is not a new finding, comparable to MRI recently completed.  At this time patient requires admission to the hospital for hepatic encephalopathy.  Discussed patient case with Dr. Shona of the [facility]  hospitalist service.  She is requested that we reach out to GI for consult, this was sent by secure chat.  Patient and then we will plan.  Stable on admission to hospital.    Final diagnoses:  Hepatic encephalopathy [facility])    ED Discharge Orders     None          Ruthell Lonni FALCON DEVONNA 12/15/23 [id number]    Griselda Norris, MD 12/15/23 [phone]

## 2023-12-15 NOTE — Procedures (Signed)
 PROCEDURE SUMMARY:  Successful image-guided diagnostic paracentesis from the right lower abdomen.  Yielded 150 mL of clear yellow fluid.  No immediate complications.  EBL: trace Patient tolerated well.   Specimen was sent for labs.  Please see imaging section of Epic for full dictation.  Kimble DEL Jaeley Wiker PA-C 12/15/2023 4:01 PM

## 2023-12-15 NOTE — Telephone Encounter (Signed)
 Not able to speak with pt chart states that pt is currently at the ED

## 2023-12-15 NOTE — Plan of Care (Signed)
   Problem: Education: Goal: Ability to describe self-care measures that may prevent or decrease complications (Diabetes Survival Skills Education) will improve Outcome: Progressing Goal: Individualized Educational Video(s) Outcome: Progressing

## 2023-12-15 NOTE — ED Notes (Signed)
 Paged provider regarding pt request for benadryl .

## 2023-12-15 NOTE — H&P (Addendum)
 History and Physical    Patient: Laurie Sutton FMW:[id number] DOB: March 22, 1981 DOA: 12/15/2023 DOS: the patient was seen and examined on 12/15/2023 PCP: [name], MD  Patient coming from: Home  Chief Complaint:  Chief Complaint  Patient presents with   Shortness of Breath   Abdominal Pain   HPI: Laurie Sutton is a 43 y.o. female with medical history significant of chronic hypotension, decompensated liver cirrhosis secondary to NASH with esophageal varices and portal hypertensive gastropathy, diabetes mellitus type 2, C. difficile colitis, anxiety, depression, GERD presents with abdominal pain and fatigue following a recent paracentesis.   She has been feeling unwell since Friday after undergoing a paracentesis procedure. Significant fatigue and a lack of energy have been persistent since the procedure. Abdominal pain was initially described as feeling like 'knives sticking in it,' affecting the entire stomach area, particularly severe in the epigastric region, making breathing difficult. The pain has since eased but remains significant. There has been a weight increase from 176 to 186 pounds since the last hospital discharge on Friday. She is currently undergoing evaluation for a transplant. Regular bowel movements, typically two to three times per day, have ceased since hospital admission. Severe itching has been noted, and she has requested medication to alleviate this symptom.  She has no significant history of alcohol use.  In the ED patient was noted to be afebrile mild tachypnea, blood pressures 99/50 - 110/46, and all other vital signs maintained.  Labs significant for WBC 2.9, hemoglobin 10.7, platelets 85, sodium 131, glucose 146, albumin  2.9, lipase 47, AST 47, ALT 27, total bilirubin 3.1, ammonia 73, and lactic acid 3.  Chest x-ray showed no active disease.  Urinalysis not concerning for infection.  CT scan of the abdomen and pelvis with contrast noted portal vein dilated up  to 2.3 cm and moderately enlarged spleen approximately 17 cm, multiple stones in the gallbladder, and a small periumbilical hernia.  Patient had been given 1 L normal saline IV fluids, Zofran , morphine , Bentyl , and lactulose  20 g p.o.  Review of Systems: As mentioned in the history of present illness. All other systems reviewed and are negative. Past Medical History:  Diagnosis Date   ADHD    Allergy    Anemia    IRON TRANSFUSION 07-2020 NONE SINCE   Anxiety    Back pain    COVID 08/12/2019   ALL SYMPTOMS REOLVED PER PT   Depression    Diabetic neuropathy (HCC) 11/11/2020   FEET   dm type 2    sees Dr. Odella Jacobson at [facility]   Fatty liver    GERD (gastroesophageal reflux disease)    History of kidney stones    Joint pain    Menorrhagia 11/11/2020   Migraines    Murmur, cardiac    FAINT NO CARDIOLOGIST   Other fatigue    Pneumonia 08/12/2019   COVID PNEUMONIA ALL SYMPTOMS RESOLVED PER PT   Shortness of breath on exertion    Past Surgical History:  Procedure Laterality Date   CESAREAN SECTION  12/13/2006   COLONOSCOPY WITH PROPOFOL  N/A 08/02/2023   Procedure: COLONOSCOPY WITH PROPOFOL ;  Surgeon: Legrand Victory LITTIE DOUGLAS, MD;  Location: [facility];  Service: Gastroenterology;  Laterality: N/A;   ESOPHAGOGASTRODUODENOSCOPY (EGD) WITH PROPOFOL  N/A 08/02/2023   Procedure: ESOPHAGOGASTRODUODENOSCOPY (EGD) WITH PROPOFOL ;  Surgeon: Legrand Victory LITTIE DOUGLAS, MD;  Location: [facility];  Service: Gastroenterology;  Laterality: N/A;   EXTRACORPOREAL SHOCK WAVE LITHOTRIPSY  2018   FLEXIBLE SIGMOIDOSCOPY  N/A 11/20/2023   Procedure: KINGSTON SIDE;  Surgeon: Albertus Gordy HERO, MD;  Location: [facility];  Service: Gastroenterology;  Laterality: N/A;   FOOT SURGERY Right 10/02/2020   RIGHT FOOT HEEL AND TOES, SURGICAL CENTER OFF [address]   IR PARACENTESIS  07/06/2023   IR PARACENTESIS  08/18/2023   IR PARACENTESIS  10/05/2023   IR PARACENTESIS  10/11/2023   IR PARACENTESIS  10/20/2023    IR PARACENTESIS  11/08/2023   IR PARACENTESIS  11/28/2023   IR PARACENTESIS  12/09/2023   IR TRANSCATHETER BX  07/26/2023   IR US  GUIDE VASC ACCESS RIGHT  07/26/2023   IR VENOGRAM HEPATIC W HEMODYNAMIC EVALUATION  07/26/2023   LAPAROSCOPIC VAGINAL HYSTERECTOMY WITH SALPINGECTOMY Bilateral 11/17/2020   Procedure: LAPAROSCOPIC ASSISTED VAGINAL HYSTERECTOMY WITH BILATERAL SALPINGECTOMY;  Surgeon: Dannielle Bouchard, DO;  Location: [facility];  Service: Gynecology;  Laterality: Bilateral;   POLYPECTOMY  08/02/2023   Procedure: POLYPECTOMY, INTESTINE;  Surgeon: Legrand Victory LITTIE DOUGLAS, MD;  Location: [facility];  Service: Gastroenterology;;   Social History:  reports that she has never smoked. She has been exposed to tobacco smoke. She has never used smokeless tobacco. She reports that she does not drink alcohol and does not use drugs.  Allergies  Allergen Reactions   Vancomycin  Itching   Amoxicillin Hives and Itching   Azithromycin      DOES NOT WORK   Dulaglutide  Other (See Comments)    constipation and stomach issues  **Trulicity **    Empagliflozin -Metformin  Hcl Er Other (See Comments) and Swelling   Januvia  [Sitagliptin ] Other (See Comments)    HURTS STOAMCH   Latex     IRRITATES VAGINAL AREA   Metformin      Other Reaction(s): achy all over   Penicillins Hives    Has patient had a PCN reaction causing immediate rash, facial/tongue/throat swelling, SOB or lightheadedness with hypotension: yes. Rash  Has patient had a PCN reaction causing severe rash involving mucus membranes or skin necrosis: Yes- rash and hives all over body  Has patient had a PCN reaction that required hospitalization No Has patient had a PCN reaction occurring within the last 10 years: Yes  If all of the above answers are NO, then may proceed with Cephalosporin use.    Vitamin K  And Related Other (See Comments)    Electric shock     Family History  Problem Relation Age of Onset   Colon polyps Mother     Depression Mother    Anxiety disorder Mother    Colon polyps Father    Sleep apnea Father    Anxiety disorder Father    Depression Father    Diabetes Father    Bladder Cancer Father 7   Rheum arthritis Father    Migraines Maternal Aunt    Migraines Maternal Grandmother    Diabetes Maternal Grandfather    Healthy Daughter    Asthma Daughter    Anxiety disorder Daughter    Anxiety disorder Son    Asthma Son    Healthy Son    Cerebral aneurysm Cousin    Heart disease Other    Depression Other    Anxiety disorder Other    Sleep apnea Other    Colon cancer Neg Hx    Esophageal cancer Neg Hx    Stomach cancer Neg Hx    Rectal cancer Neg Hx     Prior to Admission medications   Medication Sig Start Date End Date Taking? Authorizing Provider  acetaminophen  (TYLENOL ) 325  MG tablet Take 2 tablets (650 mg total) by mouth every 6 (six) hours as needed for mild pain (pain score 1-3) or fever (or Fever >/= 101). 10/07/23  Yes Sheikh, Omair Latif, DO  albuterol  (VENTOLIN  HFA) 108 [phone] Base) MCG/ACT inhaler INHALE 2 PUFFS INTO THE LUNGS UP TO EVERY 6 HOURS AS NEEDED FOR WHEEZING/SHORTNESS OF BREATH 08/20/22  Yes [name], MD  calcium  carbonate (TUMS - DOSED IN MG ELEMENTAL CALCIUM ) 500 MG chewable tablet Chew 2 tablets by mouth as needed for indigestion or heartburn.   Yes [provider]  clotrimazole -betamethasone  (LOTRISONE ) cream APPLY 1 APPLICATION TOPICALLY TWICE A DAY AS NEEDED 05/26/23  Yes [name], MD  Continuous Glucose Sensor (DEXCOM G7 SENSOR) MISC Use 1 sensor for continuous glucose monitoring every 10 days for 30 days 05/25/23  Yes Braulio Hough, MD  FLUoxetine  (PROZAC ) 40 MG capsule TAKE 1 CAPSULE BY MOUTH TWICE A DAY Patient taking differently: Take 80 mg by mouth in the morning. 07/26/23  Yes [name], MD  furosemide  (LASIX ) 80 MG tablet Take 1 tablet (80 mg total) by mouth daily. 10/11/23  Yes Sheikh, Omair Latif, DO  HUMULIN  R U-500 KWIKPEN 500 UNIT/ML  KwikPen Use 120 units at breakfast, 100 at lunch, and 100 at dinner Patient taking differently: Use 60 units at breakfast, 65 at lunch, and 65 at dinner 04/08/22  Yes [name], MD  hydrocortisone  2.5 % cream Use 3 (three) times daily rectally Patient taking differently: Apply 1 Application topically 3 (three) times daily as needed. 11/22/23  Yes Dennise Lavada POUR, MD  midodrine  (PROAMATINE ) 5 MG tablet Take 1 tablet (5 mg total) by mouth 2 (two) times daily with a meal. Patient taking differently: Take 5 mg by mouth daily. 11/22/23  Yes Dennise Lavada POUR, MD  montelukast  (SINGULAIR ) 10 MG tablet Take 1 tablet (10 mg total) by mouth at bedtime. 08/25/22  Yes [name], MD  omeprazole  (PRILOSEC) 40 MG capsule TAKE 1 CAPSULE (40 MG TOTAL) BY MOUTH IN THE MORNING 10/23/23  Yes Danis, Victory LITTIE MOULD, MD  ondansetron  (ZOFRAN ) 4 MG tablet Take 1 tablet (4 mg total) by mouth every 8 (eight) hours as needed for nausea or vomiting. 10/18/23  Yes Craig Palma R, PA-C  spironolactone  (ALDACTONE ) 100 MG tablet Take 3 tablets (300 mg total) by mouth daily. 11/01/23 04/11/24 Yes Sira, Zackery, MD  Vitamin D , Ergocalciferol , (DRISDOL ) 1.25 MG (50000 UNIT) CAPS capsule Take 50,000 Units by mouth every 7 (seven) days. No specific day 11/19/23  Yes [provider]  XIFAXAN  550 MG TABS tablet Take 550 mg by mouth in the morning. 10/27/23  Yes [provider]    Physical Exam: Vitals:   12/15/23 0515 12/15/23 0530 12/15/23 0536 12/15/23 0545  BP: (!) 99/50 (!) 100/47  (!) 100/52  Pulse: 93 96  96  Resp: 20 (!) 23  18  Temp:      SpO2: 100% 100% 100% 99%  Constitutional: Middle-aged female who appears to be in some discomfort Eyes: PERRL, lids and conjunctivae normal ENMT: Mucous membranes are moist.  Normal dentition.  Neck: normal, supple, Respiratory: Mildly decreased overall aeration without significant wheezes or crackles appreciated. Cardiovascular: Regular rate and rhythm, no murmurs /  rubs / gallops. No extremity edema. 2+ pedal pulses. No carotid bruits.  Abdomen: Moderately distended abdomen that is soft with some tenderness to palpation.  Bowel sounds appreciated. Musculoskeletal: no clubbing / cyanosis. No joint deformity upper and lower extremities.  Good ROM, no contractures. Normal muscle tone.  Skin: no rashes, lesions, ulcers. No induration Neurologic: CN 2-12 grossly intact.  Strength 5/5 in all 4.  Psychiatric: Normal judgment and insight.  Patient appears lethargic but easily aroused and able to answer all questions.  Data Reviewed:  EKG revealed sinus rhythm at 96 bpm with QTc 534.  Assessment and Plan:   Laurie Sutton hepatic cirrhosis-decompensated Hepatic encephalopathy Patient complaints of generalized fatigue and abdominal pain after recent paracentesis.  Exam patient with significant abdominal distention.  CT scan of the abdomen pelvis noted cirrhotic liver morphology with dilated portal vein, moderately enlarged spleen, and multiple gallstones within the gallbladder.  Labs noted ammonia level 73, AST 47, and total bilirubin 3.1.  Patient was given lactulose  20 g.  Question of possibility of SBP as cause for abdominal pain. - Admit to medical telemetry bed - Monitor intake and output - Continue furosemide , spironolactone , rifaximin  - Lactulose  20 g twice daily - IR consulted for diagnostic paracentesis follow-up pending studies - GI consulted to evaluate,  will follow-up for any further recommendations  Cholelithiasis Patient noted to have cholelithiasis without signs of cholecystitis on CT.  However question if this could be a cause for patient's abdominal pain. - Recheck CMP in a.m. -   Chronic hypotension Blood pressures initially as low was 91/45. -Goal maps greater than 65 - Continue midodrine   Pancytopenia Chronic.  Labs noted WBC 2.9, hemoglobin 10.7, and platelets 85. - Check CRP  Diabetes mellitus type 2 with long-term use of insulin  Last  available hemoglobin A1c noted to be 6.1 when checked on 10/05/2023 - Hypoglycemic protocols - Regular Humulin  insulin  30 units 3 times daily - CBGs before every meal with resistant SSI - Adjust insulin  regimen as deemed medically appropriate  Anxiety and depression - Continue Prozac   GERD - Continue pharmacy substitution of Protonix  or omeprazole   DVT prophylaxis: SCDs Advance Care Planning:   Code Status: Full Code    Consults: GI  Family Communication: No answer when called  Severity of Illness: The appropriate patient status for this patient is OBSERVATION. Observation status is judged to be reasonable and necessary in order to provide the required intensity of service to ensure the patient's safety. The patient's presenting symptoms, physical exam findings, and initial radiographic and laboratory data in the context of their medical condition is felt to place them at decreased risk for further clinical deterioration. Furthermore, it is anticipated that the patient will be medically stable for discharge from the hospital within 2 midnights of admission.   Author: Maximino DELENA Sharps, MD 12/15/2023 8:07 AM  For on call review [email or web address].

## 2023-12-16 ENCOUNTER — Observation Stay (HOSPITAL_COMMUNITY)

## 2023-12-16 DIAGNOSIS — K7682 Hepatic encephalopathy: Secondary | ICD-10-CM | POA: Diagnosis not present

## 2023-12-16 DIAGNOSIS — K7469 Other cirrhosis of liver: Secondary | ICD-10-CM | POA: Diagnosis not present

## 2023-12-16 DIAGNOSIS — I8500 Esophageal varices without bleeding: Secondary | ICD-10-CM | POA: Diagnosis not present

## 2023-12-16 DIAGNOSIS — R1084 Generalized abdominal pain: Secondary | ICD-10-CM | POA: Diagnosis not present

## 2023-12-16 HISTORY — PX: IR PARACENTESIS: IMG2679

## 2023-12-16 LAB — COMPREHENSIVE METABOLIC PANEL WITH GFR
ALT: 23 U/L (ref 0–44)
AST: 41 U/L (ref 15–41)
Albumin: 2.5 g/dL — ABNORMAL LOW (ref 3.5–5.0)
Alkaline Phosphatase: 71 U/L (ref 38–126)
Anion gap: 8 (ref 5–15)
BUN: 7 mg/dL (ref 6–20)
CO2: 24 mmol/L (ref 22–32)
Calcium: 8.2 mg/dL — ABNORMAL LOW (ref 8.9–10.3)
Chloride: 104 mmol/L (ref 98–111)
Creatinine, Ser: 0.83 mg/dL (ref 0.44–1.00)
GFR, Estimated: >60 mL/min (ref >60)
Glucose, Bld: 94 mg/dL (ref 70–99)
Potassium: 3.8 mmol/L (ref 3.5–5.1)
Sodium: 136 mmol/L (ref 135–145)
Total Bilirubin: 3 mg/dL — ABNORMAL HIGH (ref 0.0–1.2)
Total Protein: 6 g/dL — ABNORMAL LOW (ref 6.5–8.1)

## 2023-12-16 LAB — GLUCOSE, CAPILLARY
Glucose-Capillary: 182 mg/dL — ABNORMAL HIGH (ref 70–99)
Glucose-Capillary: 161 mg/dL — ABNORMAL HIGH (ref 70–99)
Glucose-Capillary: 79 mg/dL (ref 70–99)
Glucose-Capillary: 267 mg/dL — ABNORMAL HIGH (ref 70–99)

## 2023-12-16 LAB — CBC
HCT: 27.9 % — ABNORMAL LOW (ref 36.0–46.0)
Hemoglobin: 9.1 g/dL — ABNORMAL LOW (ref 12.0–15.0)
MCH: 30.2 pg (ref 26.0–34.0)
MCHC: 32.6 g/dL (ref 30.0–36.0)
MCV: 92.7 fL (ref 80.0–100.0)
Platelets: 71 K/uL — ABNORMAL LOW (ref 150–400)
RBC: 3.01 MIL/uL — ABNORMAL LOW (ref 3.87–5.11)
RDW: 15.7 % — ABNORMAL HIGH (ref 11.5–15.5)
WBC: 2.3 K/uL — ABNORMAL LOW (ref 4.0–10.5)
nRBC: 0 % (ref 0.0–0.2)

## 2023-12-16 LAB — PROTIME-INR
INR: 1.7 — ABNORMAL HIGH (ref 0.8–1.2)
Prothrombin Time: 21.2 s — ABNORMAL HIGH (ref 11.4–15.2)

## 2023-12-16 LAB — PATHOLOGIST SMEAR REVIEW

## 2023-12-16 MED ORDER — ONDANSETRON HCL 4 MG/2ML IJ SOLN
4.0000 mg | Freq: Four times a day (QID) | INTRAMUSCULAR | Status: DC | PRN
  Administered 2023-12-16 – 2023-12-19 (×5): 4 mg via INTRAVENOUS
  Filled 2023-12-16 (×5): qty 2

## 2023-12-16 MED ORDER — MORPHINE SULFATE (PF) 2 MG/ML IV SOLN
1.0000 mg | INTRAVENOUS | Status: DC | PRN
  Administered 2023-12-16 – 2023-12-17 (×2): 1 mg via INTRAVENOUS
  Filled 2023-12-16 (×2): qty 1

## 2023-12-16 MED ORDER — LIDOCAINE-EPINEPHRINE 1 %-1:100000 IJ SOLN
INTRAMUSCULAR | Status: AC
  Filled 2023-12-16: qty 1

## 2023-12-16 MED ORDER — LIDOCAINE-EPINEPHRINE 1 %-1:100000 IJ SOLN
20.0000 mL | Freq: Once | INTRAMUSCULAR | Status: AC
  Administered 2023-12-16: 8 mL via INTRADERMAL

## 2023-12-16 MED ORDER — SCOPOLAMINE 1 MG/3DAYS TD PT72
1.0000 | MEDICATED_PATCH | Freq: Once | TRANSDERMAL | Status: AC | PRN
  Administered 2023-12-16: 1 mg via TRANSDERMAL
  Filled 2023-12-16: qty 1

## 2023-12-16 MED ORDER — INSULIN GLARGINE 100 UNIT/ML ~~LOC~~ SOLN
10.0000 [IU] | Freq: Every day | SUBCUTANEOUS | Status: DC
  Administered 2023-12-16 – 2023-12-19 (×3): 10 [IU] via SUBCUTANEOUS
  Filled 2023-12-16 (×4): qty 0.1

## 2023-12-16 MED ORDER — MIDODRINE HCL 5 MG PO TABS
5.0000 mg | ORAL_TABLET | Freq: Two times a day (BID) | ORAL | Status: DC
  Administered 2023-12-16 – 2023-12-17 (×2): 5 mg via ORAL
  Filled 2023-12-16 (×2): qty 1

## 2023-12-16 MED ORDER — ALBUMIN HUMAN 25 % IV SOLN
50.0000 g | Freq: Once | INTRAVENOUS | Status: AC
  Administered 2023-12-16: 50 g via INTRAVENOUS
  Filled 2023-12-16: qty 200

## 2023-12-16 NOTE — Plan of Care (Signed)

## 2023-12-16 NOTE — Progress Notes (Signed)
 PROGRESS NOTE  Laurie Sutton  DOB: 06-01-1980  PCP: [name], MD FMW:[id number]  DOA: 12/15/2023  LOS: 0 days  Hospital Day: 2  Brief narrative: Laurie Sutton is a 43 y.o. female with PMH significant for MASH cirrhosis with esophageal varices, portal hypertensive gastropathy, recurrent hospitalization for hepatic encephalopathy; also with h/o DM2, C. difficile colitis, GERD, anxiety/depression. Recently hospitalized 8/8 to 8/12 for rectal bleeding which turned out to be hemorrhoidal. Patient follows up with [facility] in [place] and [facility] in [place] and is in waiting list for liver transplantation. She has been getting large-volume paracentesis about every 7 to 10 days, last paracentesis on Friday 8/29. The procedure went uneventful but she has not felt well since. On the night of 9/3, patient started feeling short of breath and felt like her abdomen is distended more pressing on her chest and hence presented to ED.  In the ED, patient was afebrile although she reported a fever of 100.6 at home Blood pressure in low normal range Initial labs showed WC count of 2.9, hemoglobin 10.7, platelet 85, sodium 131, total bilirubin 3.1, ammonia 73, lactic acid 3 Urinalysis unremarkable Blood culture was collected Chest x-ray unremarkable CT abdomen/pelvis showed -Liver cirrhosis with abnormally dilated main portal vein up to 2.3 cm and moderately enlarged spleen approximately 17 cm -Multiple stones in the gallbladder.  Admitted to [facility] GI consulted Peritoneal fluid analysis showed hazy appearing fluid with WBC count not elevated  Subjective: Patient was seen and examined this morning.  Pleasant young Caucasian female.   Lying down on bed.  Feels better than yesterday. Has large abdominal distention because of ascites. No abdominal tenderness. Family not at bedside. Chart reviewed. In the last 24 hours since admission, no fever, blood  pressure has been consistently running in 90s, breathing on room air Labs from this morning showed WC count of 2.3, hemoglobin 9.1, platelets 71, INR 1.7, bilirubin 3  Assessment and plan: Decompensated liver cirrhosis 2/2 MASH Hepatic encephalopathy Presented with worsening generalized fatigue and abdominal pain since recent paracentesis.  Also reported fever at home  CT imaging as above  Labs showed elevated ammonia, elevated bilirubin  Seen by GI. Peritoneal fluid analysis ruled out SBP. Continue lactulose , rifaximin  Currently continued on all Continue to monitor mental status changes Recent Labs  Lab 12/15/23 0354  AMMONIA 73*    Portal hypertension Ascites Chronic hypertension Diagnostic tap was done yesterday.   I have requested for therapeutic paracentesis today. Currently on sodium restricted diet PTA meds- Lasix  80 mg daily, Aldactone  300 mg daily, midodrine  5 mg daily Currently continued on all.  Blood pressure in 90s overnight.  I have increased midodrine  to 5 mg twice daily  Cholelithiasis CT abdomen showed multiple gallstones without cholecystitis.     Chronic pancytopenia Secondary to liver disease.  Platelet low at 71  H/o esophageal varices but no current evidence of bleeding. Continue PPI Recent Labs  Lab 12/15/23 0354 12/15/23 0359 12/16/23 0247  WBC 2.9*  --  2.3*  HGB 10.7* 11.2* 9.1*  HCT 33.1* 33.0* 27.9*  MCV 92.5  --  92.7  PLT 85*  --  71*   Type 2 diabetes mellitus Hyperglycemia A1c 6.1 on 10/05/2023 PTA meds-Humulin  R U-500 60 units 3 times daily Currently on NovoLog  30 units 3 times daily SSI/Accu-Cheks only.  Blood sugar level elevated to 182 this morning. I will add Semglee  10 units this morning Recent Labs  Lab 12/15/23 1227 12/15/23 1732 12/15/23  2046 12/16/23 0742  GLUCAP 138* 143* 297* 182*   Anxiety and depression Continue Prozac    GERD Continue pharmacy substitution of Protonix  or omeprazole    Mobility: Encourage  ambulation  Goals of care   Code Status: Full Code     DVT prophylaxis:  SCDs Start: 12/15/23 0819   Antimicrobials: None Fluid: None Consultants: GI Family Communication: Family not at bedside  Status: Observation Level of care:  Telemetry Medical   Patient is from: Home Needs to continue in-hospital care: Needs paracentesis today Anticipated d/c to: Hopefully home in 1 to 2 days      Diet:  Diet Order             Diet heart healthy/carb modified Fluid consistency: Thin; Fluid restriction: 1200 mL Fluid  Diet effective now                   Scheduled Meds:  furosemide   80 mg Oral Daily   insulin  aspart  0-20 Units Subcutaneous TID WC   insulin  aspart  0-5 Units Subcutaneous QHS   insulin  aspart  30 Units Subcutaneous TID WC   insulin  glargine  10 Units Subcutaneous Daily   lactulose   20 g Oral Once   lactulose   20 g Oral BID   midodrine   5 mg Oral BID WC   montelukast   10 mg Oral QHS   pantoprazole   40 mg Oral Daily   rifaximin   550 mg Oral q AM   sodium chloride  flush  3 mL Intravenous Q12H   spironolactone   300 mg Oral Daily    PRN meds: acetaminophen  **OR** acetaminophen , albuterol , calcium  carbonate, diphenhydrAMINE , morphine  injection, ondansetron  (ZOFRAN ) IV, scopolamine    Infusions:    Antimicrobials: Anti-infectives (From admission, onward)    Start     Dose/Rate Route Frequency Ordered Stop   12/15/23 0830  rifaximin  (XIFAXAN ) tablet 550 mg        550 mg Oral Every morning 12/15/23 0822         Objective: Vitals:   12/16/23 0349 12/16/23 0741  BP: (!) 98/48 (!) 96/57  Pulse: 85 84  Resp: 16 18  Temp: 98.3 F (36.8 C) 98.3 F (36.8 C)  SpO2: 95% 98%    Intake/Output Summary (Last 24 hours) at 12/16/2023 1152 Last data filed at 12/15/2023 1304 Gross per 24 hour  Intake 1000 ml  Output --  Net 1000 ml   Filed Weights   12/15/23 1623  Weight: 84.4 kg   Weight change:  Body mass index is 30.02 kg/m.   Physical  Exam: General exam: Pleasant, young Caucasian female Skin: No rashes, lesions or ulcers. HEENT: Atraumatic, normocephalic, no obvious bleeding Lungs: Clear to auscultation bilaterally,  CVS: S1, S2, no murmur,   GI/Abd: Soft, nontender, distended from ascites, bowel sound present,   CNS: Alert, awake, oriented x 3 Psychiatry: Mood appropriate Extremities: No pedal edema, no calf tenderness,   Data Review: I have personally reviewed the laboratory data and studies available.  F/u labs ordered Unresulted Labs (From admission, onward)     Start     Ordered   12/17/23 0500  CBC with Differential/Platelet  Tomorrow morning,   R        12/16/23 1152   12/17/23 0500  Comprehensive metabolic panel with GFR  Tomorrow morning,   R        12/16/23 1152   12/17/23 0500  Ammonia  Tomorrow morning,   R        12/16/23 1152  12/15/23 1605  Pathologist smear review  Once,   R        12/15/23 1605            Signed, Chapman Rota, MD [facility] 12/16/2023

## 2023-12-16 NOTE — Progress Notes (Signed)
 Daily Progress Note  DOA: 12/15/2023 Hospital Day: 2  Cc:   Decompensated cirrhosis, abdominal pain  ASSESSMENT    43 y.o. year old female with decompensated cirrhosis with esophageal varices, ascites requiring serial LVP's, and hepatic encephalopathy.  Admitted with recurrent hepatic encephalopathy ( resolved) and also abdominal pain of unclear etiology MELD 3.0: 22 at 11/22/2023  5:19 AM TODAY:  Renal function normal. No SBP on diagnostic paracentesis yesterday.  Continues to have generalized stabbing/crampy abdominal pain .  She has this pain sometimes at home but it has been much worse over the last couple of days .  She is afebrile.  No change in bowel habits ( at baseline on lactulose ).  Massively distended with ascites despite CT scan yesterday reporting no ascites.   Nonproductive cough No acute findings on chest x-ray   Cholelithiasis Asymptomatic (is seems) at this point   Principal Problem:   Cirrhosis of liver (HCC) Active Problems:   Anxiety and depression   GERD (gastroesophageal reflux disease)   Pancytopenia (HCC)   Controlled type 2 diabetes mellitus with complication, with long-term current use of insulin  (HCC)   Hepatic encephalopathy (HCC)   Cholelithiasis   Chronic hypotension  PLAN   --Continue home diuretics, monitor renal function -- 2 g sodium restricted diet -- Continue Xifaxan  twice daily -- Lactulose  20 g twice daily -- Pantoprazole  40 mg daily --Will proceed with therapeutic paracentesis with IV albumin . Hopefully getting some of the fluid off of her massively distended abdomen will help with the pain --Will ask Dietician to evaluate patient's nutritional status / recommendations   Subjective   Having 3 semi-solid BMs with lactulose .  Continues to have crampy /stabbing abdominal pain which is more generalized today.  Has had the same pain before at home but much worse over the last couple days.   Objective    Recent Labs     12/15/23 0354 12/15/23 0359 12/16/23 0247  WBC 2.9*  --  2.3*  HGB 10.7* 11.2* 9.1*  HCT 33.1* 33.0* 27.9*  MCV 92.5  --  92.7  PLT 85*  --  71*   Recent Labs    12/15/23 0354 12/15/23 0359 12/16/23 0247  NA 131* 135 136  K 3.6 3.7 3.8  CL 99 100 104  CO2 19*  --  24  GLUCOSE 146* 139* 94  BUN <5* 4* 7  CREATININE 0.97 0.90 0.83  CALCIUM  8.4*  --  8.2*   Recent Labs    12/15/23 0354 12/16/23 0247  PROT 7.1 6.0*  ALBUMIN  2.9* 2.5*  AST 47* 41  ALT 27 23  ALKPHOS 93 71  BILITOT 3.1* 3.0*   Scheduled inpatient medications:   furosemide   80 mg Oral Daily   insulin  aspart  0-20 Units Subcutaneous TID WC   insulin  aspart  0-5 Units Subcutaneous QHS   insulin  aspart  30 Units Subcutaneous TID WC   insulin  glargine  10 Units Subcutaneous Daily   lactulose   20 g Oral Once   lactulose   20 g Oral BID   midodrine   5 mg Oral BID WC   montelukast   10 mg Oral QHS   pantoprazole   40 mg Oral Daily   rifaximin   550 mg Oral q AM   sodium chloride  flush  3 mL Intravenous Q12H   spironolactone   300 mg Oral Daily   Continuous inpatient infusions:  PRN inpatient medications: acetaminophen  **OR** acetaminophen , albuterol , calcium  carbonate, diphenhydrAMINE , morphine  injection, ondansetron  (ZOFRAN ) IV, scopolamine   Vital  signs in last 24 hours: Temp:  [98 F (36.7 C)-98.7 F (37.1 C)] 98.3 F (36.8 C) (09/05 0741) Pulse Rate:  [78-87] 84 (09/05 0741) Resp:  [16-18] 18 (09/05 0741) BP: (93-110)/(48-67) 96/57 (09/05 0741) SpO2:  [95 %-100 %] 98 % (09/05 0741) Weight:  [84.4 kg] 84.4 kg (09/04 1623) Last BM Date : 12/16/23  Intake/Output Summary (Last 24 hours) at 12/16/2023 1155 Last data filed at 12/15/2023 1304 Gross per 24 hour  Intake 1000 ml  Output --  Net 1000 ml    Intake/Output from previous day: 09/04 0701 - 09/05 0700 In: 1000 [IV Piggyback:1000] Out: -  Intake/Output this shift: No intake/output data recorded.   Physical Exam:  General: Alert female in  NAD Heart:  Regular rate and rhythm.  Pulmonary: Normal respiratory effort Abdomen: Soft, massively distended ,  nontender. Normal bowel sounds. Extremities: No lower extremity edema  Neurologic: Alert and oriented Psych: Pleasant. Cooperative     LOS: 0 days   [name] ,NP 12/16/2023, 11:55 AM

## 2023-12-16 NOTE — Care Management Obs Status (Signed)
 MEDICARE OBSERVATION STATUS NOTIFICATION   Patient Details  Name: Laurie Sutton MRN: [id number] Date of Birth: Apr 20, 1980   Medicare Observation Status Notification Given:  Yes Reviewed observation notice with Juliene Boron telephonically at [phone].      Corban Kistler 12/16/2023, 10:31 AM

## 2023-12-16 NOTE — Procedures (Signed)
 PROCEDURE SUMMARY:  Successful US  guided paracentesis from left lateral abdomen.  Yielded 6.4 liters of clear, yellow fluid.  No immediate complications.  Pt tolerated well.   Specimen was not sent for labs.  EBL < 5mL  Solmon Selmer Ku PA-C 12/16/2023 3:19 PM

## 2023-12-16 NOTE — Plan of Care (Signed)
 Patient calm and cooperative A&O X4, husband at bedside. MD placed one time order for novalog. Patient reported nausea, hospitalist aware, new orders placed. Tele monitor in place. Patient left with call bell in reach, side rails up and bed in lowest position.  Problem: Education: Goal: Ability to describe self-care measures that may prevent or decrease complications (Diabetes Survival Skills Education) will improve Outcome: Progressing   Problem: Coping: Goal: Ability to adjust to condition or change in health will improve Outcome: Progressing   Problem: Health Behavior/Discharge Planning: Goal: Ability to identify and utilize available resources and services will improve Outcome: Progressing   Problem: Nutritional: Goal: Maintenance of adequate nutrition will improve Outcome: Progressing   Problem: Tissue Perfusion: Goal: Adequacy of tissue perfusion will improve Outcome: Progressing   Problem: Education: Goal: Knowledge of General Education information will improve Description: Including pain rating scale, medication(s)/side effects and non-pharmacologic comfort measures Outcome: Progressing   Problem: Health Behavior/Discharge Planning: Goal: Ability to manage health-related needs will improve Outcome: Progressing   Problem: Coping: Goal: Level of anxiety will decrease Outcome: Progressing   Problem: Elimination: Goal: Will not experience complications related to bowel motility Outcome: Progressing   Problem: Pain Managment: Goal: General experience of comfort will improve and/or be controlled Outcome: Progressing   Problem: Safety: Goal: Ability to remain free from injury will improve Outcome: Progressing

## 2023-12-17 DIAGNOSIS — D61818 Other pancytopenia: Secondary | ICD-10-CM | POA: Diagnosis present

## 2023-12-17 DIAGNOSIS — R188 Other ascites: Secondary | ICD-10-CM | POA: Diagnosis present

## 2023-12-17 DIAGNOSIS — K5900 Constipation, unspecified: Secondary | ICD-10-CM | POA: Diagnosis not present

## 2023-12-17 DIAGNOSIS — F32A Depression, unspecified: Secondary | ICD-10-CM | POA: Diagnosis present

## 2023-12-17 DIAGNOSIS — Z7682 Awaiting organ transplant status: Secondary | ICD-10-CM | POA: Diagnosis not present

## 2023-12-17 DIAGNOSIS — Z79899 Other long term (current) drug therapy: Secondary | ICD-10-CM | POA: Diagnosis not present

## 2023-12-17 DIAGNOSIS — R1084 Generalized abdominal pain: Secondary | ICD-10-CM | POA: Diagnosis not present

## 2023-12-17 DIAGNOSIS — E1140 Type 2 diabetes mellitus with diabetic neuropathy, unspecified: Secondary | ICD-10-CM | POA: Diagnosis present

## 2023-12-17 DIAGNOSIS — K7581 Nonalcoholic steatohepatitis (NASH): Secondary | ICD-10-CM | POA: Diagnosis present

## 2023-12-17 DIAGNOSIS — Z818 Family history of other mental and behavioral disorders: Secondary | ICD-10-CM | POA: Diagnosis not present

## 2023-12-17 DIAGNOSIS — K219 Gastro-esophageal reflux disease without esophagitis: Secondary | ICD-10-CM | POA: Diagnosis present

## 2023-12-17 DIAGNOSIS — K7031 Alcoholic cirrhosis of liver with ascites: Secondary | ICD-10-CM | POA: Diagnosis not present

## 2023-12-17 DIAGNOSIS — E8770 Fluid overload, unspecified: Secondary | ICD-10-CM | POA: Diagnosis present

## 2023-12-17 DIAGNOSIS — Z794 Long term (current) use of insulin: Secondary | ICD-10-CM | POA: Diagnosis not present

## 2023-12-17 DIAGNOSIS — K7469 Other cirrhosis of liver: Secondary | ICD-10-CM | POA: Diagnosis not present

## 2023-12-17 DIAGNOSIS — Z833 Family history of diabetes mellitus: Secondary | ICD-10-CM | POA: Diagnosis not present

## 2023-12-17 DIAGNOSIS — I10 Essential (primary) hypertension: Secondary | ICD-10-CM | POA: Diagnosis present

## 2023-12-17 DIAGNOSIS — E1165 Type 2 diabetes mellitus with hyperglycemia: Secondary | ICD-10-CM | POA: Diagnosis present

## 2023-12-17 DIAGNOSIS — K766 Portal hypertension: Secondary | ICD-10-CM | POA: Diagnosis present

## 2023-12-17 DIAGNOSIS — I8510 Secondary esophageal varices without bleeding: Secondary | ICD-10-CM | POA: Diagnosis present

## 2023-12-17 DIAGNOSIS — Z825 Family history of asthma and other chronic lower respiratory diseases: Secondary | ICD-10-CM | POA: Diagnosis not present

## 2023-12-17 DIAGNOSIS — K7460 Unspecified cirrhosis of liver: Secondary | ICD-10-CM | POA: Diagnosis present

## 2023-12-17 DIAGNOSIS — K7682 Hepatic encephalopathy: Secondary | ICD-10-CM | POA: Diagnosis present

## 2023-12-17 DIAGNOSIS — F419 Anxiety disorder, unspecified: Secondary | ICD-10-CM | POA: Diagnosis present

## 2023-12-17 DIAGNOSIS — T50905A Adverse effect of unspecified drugs, medicaments and biological substances, initial encounter: Secondary | ICD-10-CM | POA: Diagnosis present

## 2023-12-17 DIAGNOSIS — Z8616 Personal history of COVID-19: Secondary | ICD-10-CM | POA: Diagnosis not present

## 2023-12-17 DIAGNOSIS — K5903 Drug induced constipation: Secondary | ICD-10-CM | POA: Diagnosis present

## 2023-12-17 DIAGNOSIS — E871 Hypo-osmolality and hyponatremia: Secondary | ICD-10-CM | POA: Diagnosis present

## 2023-12-17 DIAGNOSIS — K8020 Calculus of gallbladder without cholecystitis without obstruction: Secondary | ICD-10-CM | POA: Diagnosis not present

## 2023-12-17 LAB — CBC WITH DIFFERENTIAL/PLATELET
Abs Immature Granulocytes: 0.01 K/uL (ref 0.00–0.07)
Basophils Absolute: 0 K/uL (ref 0.0–0.1)
Basophils Relative: 1 %
Eosinophils Absolute: 0.1 K/uL (ref 0.0–0.5)
Eosinophils Relative: 5 %
HCT: 26.3 % — ABNORMAL LOW (ref 36.0–46.0)
Hemoglobin: 8.4 g/dL — ABNORMAL LOW (ref 12.0–15.0)
Immature Granulocytes: 1 %
Lymphocytes Relative: 21 %
Lymphs Abs: 0.4 K/uL — ABNORMAL LOW (ref 0.7–4.0)
MCH: 29.4 pg (ref 26.0–34.0)
MCHC: 31.9 g/dL (ref 30.0–36.0)
MCV: 92 fL (ref 80.0–100.0)
Monocytes Absolute: 0.3 K/uL (ref 0.1–1.0)
Monocytes Relative: 15 %
Neutro Abs: 1.1 K/uL — ABNORMAL LOW (ref 1.7–7.7)
Neutrophils Relative %: 57 %
Platelets: 62 K/uL — ABNORMAL LOW (ref 150–400)
RBC: 2.86 MIL/uL — ABNORMAL LOW (ref 3.87–5.11)
RDW: 15.3 % (ref 11.5–15.5)
WBC: 1.9 K/uL — ABNORMAL LOW (ref 4.0–10.5)
nRBC: 0 % (ref 0.0–0.2)

## 2023-12-17 LAB — GLUCOSE, CAPILLARY
Glucose-Capillary: 139 mg/dL — ABNORMAL HIGH (ref 70–99)
Glucose-Capillary: 267 mg/dL — ABNORMAL HIGH (ref 70–99)
Glucose-Capillary: 294 mg/dL — ABNORMAL HIGH (ref 70–99)
Glucose-Capillary: 292 mg/dL — ABNORMAL HIGH (ref 70–99)

## 2023-12-17 LAB — COMPREHENSIVE METABOLIC PANEL WITH GFR
ALT: 21 U/L (ref 0–44)
AST: 36 U/L (ref 15–41)
Albumin: 2.9 g/dL — ABNORMAL LOW (ref 3.5–5.0)
Alkaline Phosphatase: 75 U/L (ref 38–126)
Anion gap: 10 (ref 5–15)
BUN: <5 mg/dL — ABNORMAL LOW (ref 6–20)
CO2: 24 mmol/L (ref 22–32)
Calcium: 8.5 mg/dL — ABNORMAL LOW (ref 8.9–10.3)
Chloride: 99 mmol/L (ref 98–111)
Creatinine, Ser: 0.85 mg/dL (ref 0.44–1.00)
GFR, Estimated: >60 mL/min (ref >60)
Glucose, Bld: 270 mg/dL — ABNORMAL HIGH (ref 70–99)
Potassium: 3.5 mmol/L (ref 3.5–5.1)
Sodium: 133 mmol/L — ABNORMAL LOW (ref 135–145)
Total Bilirubin: 3 mg/dL — ABNORMAL HIGH (ref 0.0–1.2)
Total Protein: 6.1 g/dL — ABNORMAL LOW (ref 6.5–8.1)

## 2023-12-17 LAB — AMMONIA: Ammonia: 44 umol/L — ABNORMAL HIGH (ref 9–35)

## 2023-12-17 MED ORDER — MIDODRINE HCL 5 MG PO TABS
5.0000 mg | ORAL_TABLET | Freq: Once | ORAL | Status: AC
  Administered 2023-12-17: 5 mg via ORAL
  Filled 2023-12-17: qty 1

## 2023-12-17 MED ORDER — HYDROXYZINE HCL 10 MG PO TABS
10.0000 mg | ORAL_TABLET | Freq: Three times a day (TID) | ORAL | Status: DC | PRN
  Administered 2023-12-17: 10 mg via ORAL
  Filled 2023-12-17: qty 1

## 2023-12-17 MED ORDER — MIDODRINE HCL 5 MG PO TABS
10.0000 mg | ORAL_TABLET | Freq: Two times a day (BID) | ORAL | Status: DC
  Administered 2023-12-17 – 2023-12-19 (×4): 10 mg via ORAL
  Filled 2023-12-17 (×4): qty 2

## 2023-12-17 MED ORDER — POLYETHYLENE GLYCOL 3350 17 G PO PACK
17.0000 g | PACK | Freq: Every day | ORAL | Status: DC
  Administered 2023-12-17 – 2023-12-18 (×2): 17 g via ORAL
  Filled 2023-12-17 (×2): qty 1

## 2023-12-17 MED ORDER — DICYCLOMINE HCL 10 MG PO CAPS
10.0000 mg | ORAL_CAPSULE | Freq: Two times a day (BID) | ORAL | Status: DC
  Administered 2023-12-17 – 2023-12-18 (×2): 10 mg via ORAL
  Filled 2023-12-17 (×2): qty 1

## 2023-12-17 NOTE — Progress Notes (Signed)
 PROGRESS NOTE  Laurie Sutton  DOB: 02-22-1981  PCP: [name], MD FMW:[id number]  DOA: 12/15/2023  LOS: 0 days  Hospital Day: 3  Brief narrative: Laurie Sutton is a 43 y.o. female with PMH significant for MASH cirrhosis with esophageal varices, portal hypertensive gastropathy, recurrent hospitalization for hepatic encephalopathy; also with h/o DM2, C. difficile colitis, GERD, anxiety/depression. Recently hospitalized 8/8 to 8/12 for rectal bleeding which turned out to be hemorrhoidal. Patient follows up with [facility] in [place] and [facility] in [place] and is in waiting list for liver transplantation. She has been getting large-volume paracentesis about every 7 to 10 days, last paracentesis on Friday 8/29. The procedure went uneventful but she has not felt well since. On the night of 9/3, patient started feeling short of breath and felt like her abdomen is distended more pressing on her chest and hence presented to ED.  In the ED, patient was afebrile although she reported a fever of 100.6 at home Blood pressure in low normal range Initial labs showed WC count of 2.9, hemoglobin 10.7, platelet 85, sodium 131, total bilirubin 3.1, ammonia 73, lactic acid 3 Urinalysis unremarkable Blood culture was collected Chest x-ray unremarkable CT abdomen/pelvis showed -Liver cirrhosis with abnormally dilated main portal vein up to 2.3 cm and moderately enlarged spleen approximately 17 cm -Multiple stones in the gallbladder.  Admitted to [facility] GI consulted Peritoneal fluid analysis showed hazy appearing fluid with WBC count not elevated  Subjective: Patient was seen and examined this morning.  Pleasant young Caucasian female.   Feels better than yesterday.  Appetite improving. She still has intermittent pain in the abdomen. Underwent 6.5 L of paracentesis yesterday. Blood pressure continues to remain in low normal range.  Assessment and  plan: Decompensated liver cirrhosis 2/2 MASH Hepatic encephalopathy Presented with worsening generalized fatigue and abdominal pain since recent paracentesis.  Also reported fever at home  CT imaging as above  Labs showed elevated ammonia, elevated bilirubin  Seen by GI. Peritoneal fluid analysis ruled out SBP. Continue lactulose , rifaximin  Mental status significantly improved to normal.  Appetite improving.  Still with episodic intermittent abdominal pain. Recent Labs  Lab 12/15/23 0354 12/17/23 0231  AMMONIA 73* 44*   Portal hypertension Ascites Chronic hypertension Underwent 6.5 L of paracentesis yesterday 9/5. Currently on sodium restricted diet PTA meds- Lasix  80 mg daily, Aldactone  300 mg daily, midodrine  5 mg daily Continued on Lasix  and Aldactone  at those doses Midodrine  dose being escalated.  I would increase from 5 mg twice daily to 10 mg twice daily today.  Cholelithiasis CT abdomen showed multiple gallstones without cholecystitis.     Chronic pancytopenia Secondary to liver disease.   Slight depression in all cell lines in follow-up CBC today.  No active bleeding. H/o esophageal varices but no current evidence of bleeding. Continue PPI Recent Labs  Lab 12/15/23 0354 12/15/23 0359 12/16/23 0247 12/17/23 0231  WBC 2.9*  --  2.3* 1.9*  NEUTROABS  --   --   --  1.1*  HGB 10.7* 11.2* 9.1* 8.4*  HCT 33.1* 33.0* 27.9* 26.3*  MCV 92.5  --  92.7 92.0  PLT 85*  --  71* 62*   Hyponatremia Sodium level slightly low.  Hypervolemic hyponatremia likely due to his liver cirrhosis. Recent Labs  Lab 12/15/23 0354 12/15/23 0359 12/16/23 0247 12/17/23 0231  NA 131* 135 136 133*   Type 2 diabetes mellitus Hyperglycemia A1c 6.1 on 10/05/2023 PTA meds-Humulin  R U-500 60 units 3  times daily Currently on Lantus  10 units daily NovoLog  30 units 3 times daily SSI/Accu-Cheks only.  Blood sugar level in the range as below. Recent Labs  Lab 12/16/23 0742 12/16/23 1217  12/16/23 1714 12/16/23 2113 12/17/23 0740  GLUCAP 182* 161* 79 267* 139*   Anxiety and depression Continue Prozac    GERD Continue PPI   Mobility: Encourage ambulation  Goals of care   Code Status: Full Code     DVT prophylaxis:  SCDs Start: 12/15/23 0819   Antimicrobials: None Fluid: None Consultants: GI Family Communication: Family not at bedside  Status: Observation Level of care:  Telemetry Medical   Patient is from: Home Needs to continue in-hospital care: Ongoing titration of medicines. Anticipated d/c to: Home after clearance by GI.    Diet:  Diet Order             Diet heart healthy/carb modified Fluid consistency: Thin; Fluid restriction: 1200 mL Fluid  Diet effective now                   Scheduled Meds:  furosemide   80 mg Oral Daily   insulin  aspart  0-20 Units Subcutaneous TID WC   insulin  aspart  0-5 Units Subcutaneous QHS   insulin  aspart  30 Units Subcutaneous TID WC   insulin  glargine  10 Units Subcutaneous Daily   lactulose   20 g Oral Once   lactulose   20 g Oral BID   midodrine   10 mg Oral BID WC   midodrine   5 mg Oral Once   montelukast   10 mg Oral QHS   pantoprazole   40 mg Oral Daily   rifaximin   550 mg Oral q AM   sodium chloride  flush  3 mL Intravenous Q12H   spironolactone   300 mg Oral Daily    PRN meds: acetaminophen  **OR** acetaminophen , albuterol , calcium  carbonate, diphenhydrAMINE , hydrOXYzine , morphine  injection, ondansetron  (ZOFRAN ) IV, scopolamine    Infusions:    Antimicrobials: Anti-infectives (From admission, onward)    Start     Dose/Rate Route Frequency Ordered Stop   12/15/23 0830  rifaximin  (XIFAXAN ) tablet 550 mg        550 mg Oral Every morning 12/15/23 0822         Objective: Vitals:   12/17/23 0335 12/17/23 0741  BP: (!) 99/51 (!) 107/54  Pulse: 76 74  Resp: 14 18  Temp: 98.7 F (37.1 C) 97.6 F (36.4 C)  SpO2: 100% 98%    Intake/Output Summary (Last 24 hours) at 12/17/2023 1038 Last  data filed at 12/17/2023 0832 Gross per 24 hour  Intake 126 ml  Output --  Net 126 ml   Filed Weights   12/15/23 1623  Weight: 84.4 kg   Weight change:  Body mass index is 30.02 kg/m.   Physical Exam: General exam: Pleasant, young Caucasian female Skin: No rashes, lesions or ulcers. HEENT: Atraumatic, normocephalic, no obvious bleeding Lungs: Clear to auscultation bilaterally,  CVS: S1, S2, no murmur,   GI/Abd: Soft, nontender, abdominal distention improved after paracentesis, bowel sound present,   CNS: Alert, awake, oriented x 3 Psychiatry: Mood appropriate Extremities: No pedal edema, no calf tenderness,   Data Review: I have personally reviewed the laboratory data and studies available.  F/u labs ordered Unresulted Labs (From admission, onward)    None       Signed, Chapman Rota, MD [facility] 12/17/2023

## 2023-12-17 NOTE — Plan of Care (Signed)

## 2023-12-17 NOTE — Progress Notes (Signed)
 Daily Progress Note  DOA: 12/15/2023 Hospital Day: 3  Cc: Decompensated cirrhosis with large volume ascites  ASSESSMENT    43 y.o. year old female with decompensated cirrhosis with esophageal varices, ascites requiring frequent LVP's, and hepatic encephalopathy.  Admitted with recurrent hepatic encephalopathy ( resolved) and also abdominal pain of unclear etiology MELD 3.0: 22 at 11/22/2023  5:19 AM TODAY: Status post 6.4 L LVP yesterday (done after SBP was ruled out on diagnostic tap on 9/4).  Renal function normal . Feels better overall but still having occasional abdominal cramping ( one episode so far today).  Taking lactulose  twice daily but had only 1 bowel movement yesterday and none today  Pancytopenia with worsening leukopenia Maybe due in part to hemodilution ( got IV albumin  yesterday) WBC 1.9 /  abs. neutrophils at 1.1   Nonproductive cough No acute findings on chest x-ray   Cholelithiasis Asymptomatic (is seems) at this point   Principal Problem:   Cirrhosis of liver (HCC) Active Problems:   Anxiety and depression   GERD (gastroesophageal reflux disease)   Pancytopenia (HCC)   Controlled type 2 diabetes mellitus with complication, with long-term current use of insulin  (HCC)   Hepatic encephalopathy (HCC)   Cholelithiasis   Chronic hypotension   PLAN   -Trial of Bentyl  10 mg twice daily for abdominal cramps.  -Increase lactulose  to 20 g 3 times daily. -Miralax  once daily -Continues rifaximin  twice daily -Continue Aldactone  300 mg daily and Lasix  80 mg daily -Reiterated need for 2 g sodium restricted diet ( eating sausage / pork).  I did place consult for dietitian yesterday.  Requesting evaluation of nutritional status /need for supplements?  She is currently undergoing workup for liver transplant   Subjective   Feels better overall.  Only 1 episode of abdominal cramping today.  Had 1 bowel movement yesterday, none so far today.  Is not eating much  hospital food.  I think she may be getting something from an outside source which we discussed.  She needs to have this monitored to make sure sodium consumption does not exceed 2 g/day   Objective    Recent Labs    12/15/23 0354 12/15/23 0359 12/16/23 0247 12/17/23 0231  WBC 2.9*  --  2.3* 1.9*  HGB 10.7* 11.2* 9.1* 8.4*  HCT 33.1* 33.0* 27.9* 26.3*  MCV 92.5  --  92.7 92.0  PLT 85*  --  71* 62*   No results for input(s): FOLATE, VITAMINB12, FERRITIN, TIBC, IRONPCTSAT in the last 72 hours. Recent Labs    12/15/23 0354 12/15/23 0359 12/16/23 0247 12/17/23 0231  NA 131* 135 136 133*  K 3.6 3.7 3.8 3.5  CL 99 100 104 99  CO2 19*  --  24 24  GLUCOSE 146* 139* 94 270*  BUN <5* 4* 7 <5*  CREATININE 0.97 0.90 0.83 0.85  CALCIUM  8.4*  --  8.2* 8.5*       Scheduled inpatient medications:   furosemide   80 mg Oral Daily   insulin  aspart  0-20 Units Subcutaneous TID WC   insulin  aspart  0-5 Units Subcutaneous QHS   insulin  aspart  30 Units Subcutaneous TID WC   insulin  glargine  10 Units Subcutaneous Daily   lactulose   20 g Oral Once   lactulose   20 g Oral BID   midodrine   10 mg Oral BID WC   montelukast   10 mg Oral QHS   pantoprazole   40 mg Oral Daily   rifaximin   550  mg Oral q AM   sodium chloride  flush  3 mL Intravenous Q12H   spironolactone   300 mg Oral Daily   Continuous inpatient infusions:  PRN inpatient medications: acetaminophen  **OR** acetaminophen , albuterol , calcium  carbonate, diphenhydrAMINE , hydrOXYzine , morphine  injection, ondansetron  (ZOFRAN ) IV, scopolamine   Vital signs in last 24 hours: Temp:  [97.6 F (36.4 C)-98.7 F (37.1 C)] 97.6 F (36.4 C) (09/06 0741) Pulse Rate:  [74-87] 74 (09/06 0741) Resp:  [14-18] 18 (09/06 0741) BP: (99-113)/(48-59) 107/54 (09/06 0741) SpO2:  [98 %-100 %] 98 % (09/06 0741) Last BM Date : 12/16/23  Intake/Output Summary (Last 24 hours) at 12/17/2023 1154 Last data filed at 12/17/2023 9167 Gross per 24 hour   Intake 126 ml  Output --  Net 126 ml    Intake/Output from previous day: 09/05 0701 - 09/06 0700 In: 123 [P.O.:120; I.V.:3] Out: -  Intake/Output this shift: Total I/O In: 3 [I.V.:3] Out: -    Physical Exam:  General: Alert female in NAD Heart:  Regular rate and rhythm.  Pulmonary: Normal respiratory effort Abdomen: Soft, much less distended compared to yesterday.  Normal bowel sounds. Extremities: No lower extremity edema  Neurologic: Slightly sleepy , oriented.  .  No asterixis Psych: Pleasant. Cooperative     LOS: 0 days   [name] ,NP 12/17/2023, 11:54 AM

## 2023-12-18 ENCOUNTER — Inpatient Hospital Stay (HOSPITAL_COMMUNITY)

## 2023-12-18 DIAGNOSIS — R1084 Generalized abdominal pain: Secondary | ICD-10-CM | POA: Diagnosis not present

## 2023-12-18 DIAGNOSIS — K7469 Other cirrhosis of liver: Secondary | ICD-10-CM | POA: Diagnosis not present

## 2023-12-18 DIAGNOSIS — K5900 Constipation, unspecified: Secondary | ICD-10-CM | POA: Diagnosis not present

## 2023-12-18 LAB — CBC WITH DIFFERENTIAL/PLATELET
Abs Immature Granulocytes: 0.01 K/uL (ref 0.00–0.07)
Basophils Absolute: 0 K/uL (ref 0.0–0.1)
Basophils Relative: 1 %
Eosinophils Absolute: 0.1 K/uL (ref 0.0–0.5)
Eosinophils Relative: 3 %
HCT: 28.1 % — ABNORMAL LOW (ref 36.0–46.0)
Hemoglobin: 9.2 g/dL — ABNORMAL LOW (ref 12.0–15.0)
Immature Granulocytes: 0 %
Lymphocytes Relative: 14 %
Lymphs Abs: 0.3 K/uL — ABNORMAL LOW (ref 0.7–4.0)
MCH: 29.7 pg (ref 26.0–34.0)
MCHC: 32.7 g/dL (ref 30.0–36.0)
MCV: 90.6 fL (ref 80.0–100.0)
Monocytes Absolute: 0.4 K/uL (ref 0.1–1.0)
Monocytes Relative: 14 %
Neutro Abs: 1.7 K/uL (ref 1.7–7.7)
Neutrophils Relative %: 68 %
Platelets: 67 K/uL — ABNORMAL LOW (ref 150–400)
RBC: 3.1 MIL/uL — ABNORMAL LOW (ref 3.87–5.11)
RDW: 15 % (ref 11.5–15.5)
WBC: 2.5 K/uL — ABNORMAL LOW (ref 4.0–10.5)
nRBC: 0 % (ref 0.0–0.2)

## 2023-12-18 LAB — BASIC METABOLIC PANEL WITH GFR
Anion gap: 10 (ref 5–15)
BUN: <5 mg/dL — ABNORMAL LOW (ref 6–20)
CO2: 23 mmol/L (ref 22–32)
Calcium: 8.5 mg/dL — ABNORMAL LOW (ref 8.9–10.3)
Chloride: 97 mmol/L — ABNORMAL LOW (ref 98–111)
Creatinine, Ser: 0.8 mg/dL (ref 0.44–1.00)
GFR, Estimated: >60 mL/min (ref >60)
Glucose, Bld: 267 mg/dL — ABNORMAL HIGH (ref 70–99)
Potassium: 3.7 mmol/L (ref 3.5–5.1)
Sodium: 130 mmol/L — ABNORMAL LOW (ref 135–145)

## 2023-12-18 LAB — GLUCOSE, CAPILLARY
Glucose-Capillary: 137 mg/dL — ABNORMAL HIGH (ref 70–99)
Glucose-Capillary: 221 mg/dL — ABNORMAL HIGH (ref 70–99)
Glucose-Capillary: 325 mg/dL — ABNORMAL HIGH (ref 70–99)
Glucose-Capillary: 262 mg/dL — ABNORMAL HIGH (ref 70–99)

## 2023-12-18 MED ORDER — BISACODYL 5 MG PO TBEC
10.0000 mg | DELAYED_RELEASE_TABLET | Freq: Every day | ORAL | Status: DC
  Administered 2023-12-18 – 2023-12-19 (×2): 10 mg via ORAL
  Filled 2023-12-18 (×2): qty 2

## 2023-12-18 MED ORDER — IOHEXOL 350 MG/ML SOLN
75.0000 mL | Freq: Once | INTRAVENOUS | Status: AC | PRN
  Administered 2023-12-18: 75 mL via INTRAVENOUS

## 2023-12-18 MED ORDER — FENTANYL CITRATE PF 50 MCG/ML IJ SOSY
12.5000 ug | PREFILLED_SYRINGE | Freq: Once | INTRAMUSCULAR | Status: DC
  Filled 2023-12-18: qty 1

## 2023-12-18 MED ORDER — POLYETHYLENE GLYCOL 3350 17 G PO PACK: 17.0000 g | PACK | Freq: Two times a day (BID) | ORAL | Status: DC

## 2023-12-18 MED ORDER — POLYETHYLENE GLYCOL 3350 17 GM/SCOOP PO POWD
119.0000 g | Freq: Once | ORAL | Status: DC
  Filled 2023-12-18: qty 119

## 2023-12-18 MED ORDER — LACTULOSE 10 GM/15ML PO SOLN
20.0000 g | Freq: Four times a day (QID) | ORAL | Status: DC
  Administered 2023-12-18 – 2023-12-19 (×4): 20 g via ORAL
  Filled 2023-12-18 (×4): qty 30

## 2023-12-18 MED ORDER — ADULT MULTIVITAMIN W/MINERALS CH
1.0000 | ORAL_TABLET | Freq: Every day | ORAL | Status: DC
  Administered 2023-12-18 – 2023-12-19 (×2): 1 via ORAL
  Filled 2023-12-18 (×2): qty 1

## 2023-12-18 MED ORDER — PROSOURCE PLUS PO LIQD
30.0000 mL | Freq: Three times a day (TID) | ORAL | Status: DC
  Administered 2023-12-18: 30 mL via ORAL
  Filled 2023-12-18 (×4): qty 30

## 2023-12-18 MED ORDER — GLUCERNA SHAKE PO LIQD
237.0000 mL | Freq: Three times a day (TID) | ORAL | Status: DC
  Administered 2023-12-18 (×2): 237 mL via ORAL

## 2023-12-18 NOTE — Progress Notes (Addendum)
 Initial Nutrition Assessment  DOCUMENTATION CODES:   Not applicable  INTERVENTION:   -Liberalize diet to 2 gram sodium for wider variety of meal selections -MVI with minerals daily -30 ml Prosource Plus TID, each supplement provides 100 kcals and 15 grams protein -Glucerna Shake po TID, each supplement provides 220 kcal and 10 grams of protein  -RD provided Cirrhosis Nutrition Therapy handout from [facility] Nutrition Care Manual; attached to AVS/ discharge summary   NUTRITION DIAGNOSIS:   Increased nutrient needs related to chronic illness (cirrhosis) as evidenced by estimated needs.  GOAL:   Patient will meet greater than or equal to 90% of their needs   MONITOR:   PO intake, Supplement acceptance  REASON FOR ASSESSMENT:   Consult Assessment of nutrition requirement/status  ASSESSMENT:   Pt with medical history significant of chronic hypotension, decompensated liver cirrhosis secondary to NASH with esophageal varices and portal hypertensive gastropathy, diabetes mellitus type 2, C. difficile colitis, anxiety, depression, GERD presents with abdominal pain and fatigue following a recent paracentesis.  Pt admitted with decompensated NASH hepatic cirrhosis and hepatic encephalopathy.    9/4- s/p rt paracentesis (150 ml removed) 9/5- s/p lt paracentesis (6.4 L removed)  Reviewed I/O's: +126 ml x 24 hours and +1.2 L since admission  Pt unavailable at time of visit. Attempted to speak with pt via call to hospital room phone, however, unable to reach. RD unable to obtain further nutrition-related history or complete nutrition-focused physical exam at this time.    Case discussed with RN; pt with no complaints this morning.   Per GI, pt is presently being evaluated for a liver transplant and requesting RD evaluation to ensure nutritional requirements are being met. Last seen by RD in 2020 for assistance with diabetes management. Noted plans to meet with RD and RN at her  hepatologist's office in [place] in the future.  Plan to increase lactulose  and miralax . Pt continues to complain of abdominal pain and constipation. Per chart review, pt with decreased appetite (likely secondary to ascites). GI NP would like RS to evaluate need for supplements and reinforce importance of low sodium diet.   Pt currently on a heart healthy, carb modified diet with 1.2 L fluid restriction. No meal completion data available to assess at this time.   Pt's last BM on 12/16/23; noted distention and ascites per nursing assessment.   Reviewed wt hx; pt has experienced a 15.6% wt loss over the past 6 months, which is significant for time frame. Wt difficult to assess secondary to ascites, frequent paracentesis, and constipation. Ascites may be masking further weight loss. Pt is at high risk for malnutrition, however, unable to identify at this time. Pt would greatly benefit from addition of oral nutrition supplements, especially to optimize nutrition status in the setting of liver transplant.   Medications reviewed and include bentyl , lasix , lactulose , protonix , miralax , and aldactone .  Lab Results  Component Value Date   HGBA1C 6.1 (H) 10/05/2023   PTA DM medications are humulin  U-500 (60 units at breakfast, 65 units at lunch, and 65 units at dinner). Pt also uses Dexcom CGM.   Labs reviewed: CBGS: 139-294 (inpatient orders for glycemic control are 0-20 units insulin  aspart TID with meals, 0-5 units insulin  aspart daily at bedtime, 30 units inuslin aspart TID with meals, and 10 units insulin  glargine daily).    Diet Order:   Diet Order             Diet heart healthy/carb modified Fluid consistency: Thin; Fluid restriction:  1200 mL Fluid  Diet effective now                   EDUCATION NEEDS:   No education needs have been identified at this time  Skin:  Skin Assessment: Reviewed RN Assessment  Last BM:  12/16/23  Height:   Ht Readings from Last 1 Encounters:  12/15/23  5' 6 (1.676 m)    Weight:   Wt Readings from Last 1 Encounters:  12/18/23 82.3 kg    Ideal Body Weight:  59.1 kg  BMI:  Body mass index is 29.28 kg/m.  Estimated Nutritional Needs:   Kcal:  2050-2250  Protein:  105-120 grams  Fluid:  1.2 L    Margery ORN, RD, LDN, CDCES Registered Dietitian III Certified Diabetes Care and Education Specialist If unable to reach this RD, please use RD Inpatient group chat on secure chat between hours of 8am-4 pm daily

## 2023-12-18 NOTE — Progress Notes (Signed)
 PROGRESS NOTE  Laurie Sutton  DOB: 16-Jun-1980  PCP: [name], MD FMW:[id number]  DOA: 12/15/2023  LOS: 1 day  Hospital Day: 4  Brief narrative: Laurie GLASSBERG is a 43 y.o. female with PMH significant for MASH cirrhosis with esophageal varices, portal hypertensive gastropathy, recurrent hospitalization for hepatic encephalopathy; also with h/o DM2, C. difficile colitis, GERD, anxiety/depression. Recently hospitalized 8/8 to 8/12 for rectal bleeding which turned out to be hemorrhoidal. Patient follows up with [facility] in [place] and [facility] in [place] and is in waiting list for liver transplantation. She has been getting large-volume paracentesis about every 7 to 10 days, last paracentesis on Friday 8/29. The procedure went uneventful but she has not felt well since. On the night of 9/3, patient started feeling short of breath and felt like her abdomen is distended more pressing on her chest and hence presented to ED.  In the ED, patient was afebrile although she reported a fever of 100.6 at home Blood pressure in low normal range Initial labs showed WC count of 2.9, hemoglobin 10.7, platelet 85, sodium 131, total bilirubin 3.1, ammonia 73, lactic acid 3 Urinalysis unremarkable Blood culture was collected Chest x-ray unremarkable CT abdomen/pelvis showed -Liver cirrhosis with abnormally dilated main portal vein up to 2.3 cm and moderately enlarged spleen approximately 17 cm -Multiple stones in the gallbladder.  Admitted to [facility] GI consulted Peritoneal fluid analysis showed hazy appearing fluid with WBC count not elevated  Subjective: Patient was seen and examined this morning.  Pleasant young Caucasian female.   Looks drowsy.  Had some tremors in hands. Has not had a bowel movement since presentation despite lactulose .  Assessment and plan: Decompensated liver cirrhosis 2/2 MASH Hepatic encephalopathy Presented with worsening  generalized fatigue and abdominal pain since recent paracentesis.  Also reported fever at home  CT imaging as above  Labs showed elevated ammonia, elevated bilirubin  Seen by GI. Peritoneal fluid analysis ruled out SBP. Despite being on lactulose  and rifaximin , patient has not had a bowel movement.   She is looking drowsy and has tremors in both hands.   Discussed with GI.  Will increase lactulose  today Appetite still down.  Pending nutrition consult. Recent Labs  Lab 12/15/23 0354 12/17/23 0231  AMMONIA 73* 44*   Portal hypertension Ascites Chronic hypertension Underwent 6.5 L of paracentesis yesterday 9/5. Currently on sodium restricted diet PTA meds- Lasix  80 mg daily, Aldactone  300 mg daily, midodrine  5 mg daily Continued on Lasix  and Aldactone  at those doses Midodrine  dose being escalated.  Currently on midodrine  10 mg twice daily today.  Cholelithiasis CT abdomen showed multiple gallstones without cholecystitis.     Chronic pancytopenia Secondary to liver disease.   Slight depression in all cell lines in follow-up CBC today.  No active bleeding. H/o esophageal varices but no current evidence of bleeding. Continue PPI Recent Labs  Lab 12/15/23 0354 12/15/23 0359 12/16/23 0247 12/17/23 0231 12/18/23 0223  WBC 2.9*  --  2.3* 1.9* 2.5*  NEUTROABS  --   --   --  1.1* 1.7  HGB 10.7* 11.2* 9.1* 8.4* 9.2*  HCT 33.1* 33.0* 27.9* 26.3* 28.1*  MCV 92.5  --  92.7 92.0 90.6  PLT 85*  --  71* 62* 67*   Hyponatremia Sodium level slightly low.  Hypervolemic hyponatremia likely due to his liver cirrhosis. Recent Labs  Lab 12/15/23 0354 12/15/23 0359 12/16/23 0247 12/17/23 0231 12/18/23 0223  NA 131* 135 136 133* 130*  Type 2 diabetes mellitus Hyperglycemia A1c 6.1 on 10/05/2023 PTA meds-Humulin  R U-500 60 units 3 times daily Currently on Lantus  10 units daily NovoLog  30 units 3 times daily SSI/Accu-Cheks only.  Blood sugar level in the range as below. Recent Labs   Lab 12/17/23 0740 12/17/23 1218 12/17/23 1657 12/17/23 2050 12/18/23 0808  GLUCAP 139* 267* 294* 292* 137*   Anxiety and depression Continue Prozac    GERD Continue PPI   Mobility: Encourage ambulation  Goals of care   Code Status: Full Code     DVT prophylaxis:  SCDs Start: 12/15/23 0819   Antimicrobials: None Fluid: None Consultants: GI Family Communication: Family not at bedside  Status: Observation Level of care:  Telemetry Medical   Patient is from: Home Needs to continue in-hospital care: Ongoing titration of medicines. Anticipated d/c to: Home after clearance by GI.    Diet:  Diet Order             Diet 2 gram sodium Fluid consistency: Thin; Fluid restriction: 1200 mL Fluid  Diet effective now                   Scheduled Meds:  (feeding supplement) PROSource Plus  30 mL Oral TID BM   dicyclomine   10 mg Oral BID   feeding supplement (GLUCERNA SHAKE)  237 mL Oral TID BM   furosemide   80 mg Oral Daily   insulin  aspart  0-20 Units Subcutaneous TID WC   insulin  aspart  0-5 Units Subcutaneous QHS   insulin  aspart  30 Units Subcutaneous TID WC   insulin  glargine  10 Units Subcutaneous Daily   lactulose   20 g Oral Once   lactulose   20 g Oral BID   midodrine   10 mg Oral BID WC   montelukast   10 mg Oral QHS   multivitamin with minerals  1 tablet Oral Daily   pantoprazole   40 mg Oral Daily   polyethylene glycol  17 g Oral Daily   rifaximin   550 mg Oral q AM   sodium chloride  flush  3 mL Intravenous Q12H   spironolactone   300 mg Oral Daily    PRN meds: acetaminophen  **OR** acetaminophen , albuterol , calcium  carbonate, hydrOXYzine , ondansetron  (ZOFRAN ) IV, scopolamine    Infusions:    Antimicrobials: Anti-infectives (From admission, onward)    Start     Dose/Rate Route Frequency Ordered Stop   12/15/23 0830  rifaximin  (XIFAXAN ) tablet 550 mg        550 mg Oral Every morning 12/15/23 0822         Objective: Vitals:   12/18/23 0808  12/18/23 0846  BP: (!) 97/32 (!) 110/59  Pulse: 68 65  Resp: 18   Temp: 98.3 F (36.8 C)   SpO2: 100%     Intake/Output Summary (Last 24 hours) at 12/18/2023 1147 Last data filed at 12/17/2023 2157 Gross per 24 hour  Intake 123 ml  Output --  Net 123 ml   Filed Weights   12/15/23 1623 12/18/23 0346  Weight: 84.4 kg 82.3 kg   Weight change:  Body mass index is 29.28 kg/m.   Physical Exam: General exam: Pleasant, young Caucasian female. Skin: No rashes, lesions or ulcers. HEENT: Atraumatic, normocephalic, no obvious bleeding Lungs: Clear to auscultation bilaterally,  CVS: S1, S2, no murmur,   GI/Abd: Soft, nontender, abdominal distention improved after paracentesis, bowel sound present,   CNS: Looks drowsy today.  Has mild tremors of both hands Psychiatry: Sad affect Extremities: No pedal edema, no calf tenderness,  Data Review: I have personally reviewed the laboratory data and studies available.  F/u labs ordered Unresulted Labs (From admission, onward)     Start     Ordered   12/18/23 0500  CBC with Differential/Platelet  Daily,   R     Question:  Specimen collection method  Answer:  Lab=Lab collect   12/17/23 1038   12/18/23 0500  Basic metabolic panel with GFR  Daily,   R     Question:  Specimen collection method  Answer:  Lab=Lab collect   12/17/23 1038            Signed, Chapman Rota, MD [facility] 12/18/2023

## 2023-12-18 NOTE — Discharge Instructions (Signed)
Cirrhosis Nutrition Therapy   The liver helps your body digest and store nutrients from food. When you have liver disease, your body may not process nutrients normally. Symptoms of liver disease interfere with your appetite and ability to eat. Monitoring your diet may help control symptoms of liver disease. Guidelines Signs and symptoms for cirrhosis are different from person to person. Therefore, your nutrition therapy must be tailored to your particular needs.  Here are some key points to keep in mind: Your doctor or registered dietitian may tell you to take a multivitamin and mineral supplement. The amount of protein you should eat will depend on your symptoms. You may feel better, be more comfortable, and stay stronger if you eat 4 to 6 small meals per day, instead of 3 larger ones.  If you can't eat enough food, you may want to drink nutritional supplements to get more calories. If you have ascites or edema (fluid in your abdomen), you may need to cut back on sodium (salt is the main source of sodium in food). Try to stay as active as possible. Even when you are tired, walking or exercise may help you feel better. Ask your doctor or registered dietitian for more specific guidelines if you need more information or help understanding what to do.  Tips Meal Planning Tips Plan to eat small amounts of food more often. You may find it easier to eat more if you have several small meals.  If you get full quickly or have no appetite, choose foods that are high in calories (such as whole milk and canned fruit packed with heavy syrup).  If your food tastes have changed, you may need to try new foods or foods that you did not like before. To cut down the amount of sodium in your favorite foods, try these approaches: Don't salt food at the table or when you're cooking.  One teaspoon of salt has 2,000 mg sodium. Avoid convenience foods, such as canned soups and pastas, boxed meals (like macaroni and  cheese), and frozen ready-to-eat meals. Try fresh or dried herbs, spices, oils, vinegar, or juices to add flavor and replace the taste of salt. Avoid seasoning salt, garlic salt, onion salt, celery salt, meat tenderizer, and high-sodium sauces, such as soy, teriyaki, oyster, barbeque, and steak sauces. Look for no-sodium or low-sodium versions of foods you like to eat, such as crackers, cheese, or soups. Talk with your doctor before using salt substitutes.  Foods Recommended Your registered dietitian will tell you how much food you need to eat each day to be healthy. Aim to eat a variety of foods each day. In general, you can eat most foods. However, there are a few exceptions, which are listed below.  Foods Not Recommended Avoid foods that are high in sodium, such as canned soups, many canned vegetables, processed meats and cheeses, condiments, and many snack foods. You can find out how much sodium is in a food by reading the food label. Look at the Nutrition Facts label. If a food has more than 300 milligrams (mg) sodium in a serving, then it is a high-sodium food. If you have ascites, you may need to limit sodium to 2,000 mg a day.  Avoid foods that may cause foodborne illnesses. For example, you should not eat: Unpasteurized or raw milk, cheese, yogurt, and all other milk products Raw or undercooked meat, poultry, fish, game, seafood, and raw tofu Raw or undercooked eggs and foods that might contain them Unwashed fresh fruits and   vegetables Unpasteurized fruit and vegetable juices and cider All raw vegetable sprouts (alfalfa, radish, broccoli, mung bean)   Cirrhosis Sample 1-Day Menu  Breakfast 1/2 cup oatmeal with cinnamon, raisins 1 teaspoon brown sugar 1 cup whole milk 1 slice whole wheat toast 1 teaspoon margarine 1 tablespoon jam 1 cup orange juice  Morning Snack 2 tablespoons granola 6 oz yogurt  Lunch 1 cup low-sodium chicken noodle soup 1/2 turkey sandwich: 1 slice whole  wheat bread  1 ounce turkey slice 1 slice tomato 1/2 leaf lettuce 1 teaspoon mayonnaise 3-4 baby carrots 1 sliced orange 1 sliced banana 1 cup low-fat milk  Afternoon Snack 5 whole wheat crackers, no-salt added 1 cup liquid high-calorie supplement   Evening Meal 4 oz pork tenderloin 1 small baked sweet potato 2 teaspoons margarine 1/8 cup stir-fried broccoli 1/8 cup stir-fried mushrooms 1/8 cup stir-fried pea pods 1/8 cup stir-fried onion 5 grapes 1/4 chopped fresh apples  Evening Snack 3-4 cookies 1/2 cup pudding  Copyright 2021  [employer]. All rights reserved.  

## 2023-12-18 NOTE — Progress Notes (Signed)
 Daily Progress Note  DOA: 12/15/2023 Hospital Day: 4  Cc: Decompensated cirrhosis  ASSESSMENT    43 y.o. year old female with decompensated cirrhosis  with esophageal varices, ascites requiring frequent LVP's, and hepatic encephalopathy.  Admitted with recurrent hepatic encephalopathy ( resolved) and also abdominal pain of unclear etiology Followed by [facility] 3.0: 22 at 12/18/2023  2:23 AM 12/16/23 >> 6.4 L LVP  (done after SBP was ruled out on diagnostic tap on 9/4).   TODAY: Does not feel well today.  She started having abdominal cramping again, mainly in the RUQ today.  She thinks it may be constipation.  Afebrile . Renal function remains normal. CBC stable.     Constipation in setting of pain meds  Pancytopenia  Stable  Cholelithiasis Asymptomatic (it seems) at this point  Principal Problem:   Cirrhosis of liver (HCC) Active Problems:   Anxiety and depression   GERD (gastroesophageal reflux disease)   Pancytopenia (HCC)   Controlled type 2 diabetes mellitus with complication, with long-term current use of insulin  (HCC)   Hepatic encephalopathy (HCC)   Cholelithiasis   Chronic hypotension    PLAN   --Need to treat constipation aggressively to see if responsible her abdominal pain and also constipation puts her at risk for recurrent hepatic encephalopathy --Increasing lactulose  to QID --Will give one half of a MiraLAX  bowel purge and 10 mg Dulcolax now -- Morphine  stopped --Discontinuing Bentyl .  She had 2 days is without any noticeable improvement abdominal pain and it can exacerbate constipation --Continue diuretics, monitor renal function.  --Continue as needed antiemetics -- I had consulted Dietician to assess patient's nutritional status and need for any supplements and also to make sure she is adherent to 2 g sodium restricted diet at home.  Appreciate Dietitian's recommendations  Subjective   Does not feel well today.  Has just started  having recurrent abdominal stabbing/cramping discomfort again, mainly in the RUQ.  She has chronic nausea but worse today .  She is drinking fluids . Feels constipated and thinks that could be the cause of discomfort. She had only 1 bowel movement yesterday, none today.    Objective    Recent Labs    12/16/23 0247 12/17/23 0231 12/18/23 0223  WBC 2.3* 1.9* 2.5*  HGB 9.1* 8.4* 9.2*  HCT 27.9* 26.3* 28.1*  MCV 92.7 92.0 90.6  PLT 71* 62* 67*   No results for input(s): FOLATE, VITAMINB12, FERRITIN, TIBC, IRONPCTSAT in the last 72 hours. Recent Labs    12/16/23 0247 12/17/23 0231 12/18/23 0223  NA 136 133* 130*  K 3.8 3.5 3.7  CL 104 99 97*  CO2 24 24 23   GLUCOSE 94 270* 267*  BUN 7 <5* <5*  CREATININE 0.83 0.85 0.80  CALCIUM  8.2* 8.5* 8.5*   Recent Labs    12/16/23 0247 12/17/23 0231  PROT 6.0* 6.1*  ALBUMIN  2.5* 2.9*  AST 41 36  ALT 23 21  ALKPHOS 71 75  BILITOT 3.0* 3.0*     Scheduled inpatient medications:   (feeding supplement) PROSource Plus  30 mL Oral TID BM   bisacodyl   10 mg Oral Daily   feeding supplement (GLUCERNA SHAKE)  237 mL Oral TID BM   furosemide   80 mg Oral Daily   insulin  aspart  0-20 Units Subcutaneous TID WC   insulin  aspart  0-5 Units Subcutaneous QHS   insulin  aspart  30 Units Subcutaneous TID WC   insulin  glargine  10 Units Subcutaneous Daily  lactulose   20 g Oral Once   lactulose   20 g Oral QID   midodrine   10 mg Oral BID WC   montelukast   10 mg Oral QHS   multivitamin with minerals  1 tablet Oral Daily   pantoprazole   40 mg Oral Daily   polyethylene glycol powder  119 g Oral Once   rifaximin   550 mg Oral q AM   sodium chloride  flush  3 mL Intravenous Q12H   spironolactone   300 mg Oral Daily   Continuous inpatient infusions:  PRN inpatient medications: acetaminophen  **OR** acetaminophen , albuterol , calcium  carbonate, hydrOXYzine , ondansetron  (ZOFRAN ) IV, scopolamine   Vital signs in last 24 hours: Temp:  [98.3 F  (36.8 C)-98.7 F (37.1 C)] 98.3 F (36.8 C) (09/07 0808) Pulse Rate:  [65-74] 65 (09/07 1215) Resp:  [16-18] 18 (09/07 1215) BP: (92-113)/(32-65) 108/43 (09/07 1215) SpO2:  [99 %-100 %] 100 % (09/07 1215) Weight:  [82.3 kg] 82.3 kg (09/07 0346) Last BM Date : 12/16/23  Intake/Output Summary (Last 24 hours) at 12/18/2023 1448 Last data filed at 12/17/2023 2157 Gross per 24 hour  Intake 123 ml  Output --  Net 123 ml    Intake/Output from previous day: 09/06 0701 - 09/07 0700 In: 126 [P.O.:120; I.V.:6] Out: -  Intake/Output this shift: No intake/output data recorded.   Physical Exam:  General: Alert female in NAD Heart:  Regular rate and rhythm.  Pulmonary: Normal respiratory effort Abdomen: Soft but distended, nontender. Normal bowel sounds. Extremities: No lower extremity edema  Neurologic: Alert and oriented Psych: Pleasant. Cooperative     LOS: 1 day   [name] ,NP 12/18/2023, 2:48 PM

## 2023-12-18 NOTE — Progress Notes (Incomplete)
 [facility] night cross cover note:   I was notified by the patient's RN  ***  *** fentanyl  12.5 mcg iv x 1 dose   Minie Roadcap, DO Hospitalist

## 2023-12-19 ENCOUNTER — Telehealth: Payer: Self-pay

## 2023-12-19 ENCOUNTER — Other Ambulatory Visit: Payer: Self-pay

## 2023-12-19 ENCOUNTER — Other Ambulatory Visit (HOSPITAL_COMMUNITY): Payer: Self-pay

## 2023-12-19 DIAGNOSIS — K7682 Hepatic encephalopathy: Secondary | ICD-10-CM | POA: Diagnosis not present

## 2023-12-19 DIAGNOSIS — K8020 Calculus of gallbladder without cholecystitis without obstruction: Secondary | ICD-10-CM | POA: Diagnosis not present

## 2023-12-19 DIAGNOSIS — K7460 Unspecified cirrhosis of liver: Secondary | ICD-10-CM

## 2023-12-19 DIAGNOSIS — K7469 Other cirrhosis of liver: Secondary | ICD-10-CM | POA: Diagnosis not present

## 2023-12-19 DIAGNOSIS — R1084 Generalized abdominal pain: Secondary | ICD-10-CM | POA: Diagnosis not present

## 2023-12-19 DIAGNOSIS — K7031 Alcoholic cirrhosis of liver with ascites: Secondary | ICD-10-CM

## 2023-12-19 LAB — BASIC METABOLIC PANEL WITH GFR
Anion gap: 10 (ref 5–15)
BUN: 5 mg/dL — ABNORMAL LOW (ref 6–20)
CO2: 25 mmol/L (ref 22–32)
Calcium: 8.7 mg/dL — ABNORMAL LOW (ref 8.9–10.3)
Chloride: 96 mmol/L — ABNORMAL LOW (ref 98–111)
Creatinine, Ser: 0.8 mg/dL (ref 0.44–1.00)
GFR, Estimated: >60 mL/min (ref >60)
Glucose, Bld: 209 mg/dL — ABNORMAL HIGH (ref 70–99)
Potassium: 3.4 mmol/L — ABNORMAL LOW (ref 3.5–5.1)
Sodium: 131 mmol/L — ABNORMAL LOW (ref 135–145)

## 2023-12-19 LAB — CBC WITH DIFFERENTIAL/PLATELET
Abs Immature Granulocytes: 0.01 K/uL (ref 0.00–0.07)
Basophils Absolute: 0 K/uL (ref 0.0–0.1)
Basophils Relative: 1 %
Eosinophils Absolute: 0.1 K/uL (ref 0.0–0.5)
Eosinophils Relative: 5 %
HCT: 30.2 % — ABNORMAL LOW (ref 36.0–46.0)
Hemoglobin: 9.8 g/dL — ABNORMAL LOW (ref 12.0–15.0)
Immature Granulocytes: 0 %
Lymphocytes Relative: 16 %
Lymphs Abs: 0.4 K/uL — ABNORMAL LOW (ref 0.7–4.0)
MCH: 29.2 pg (ref 26.0–34.0)
MCHC: 32.5 g/dL (ref 30.0–36.0)
MCV: 89.9 fL (ref 80.0–100.0)
Monocytes Absolute: 0.4 K/uL (ref 0.1–1.0)
Monocytes Relative: 13 %
Neutro Abs: 1.7 K/uL (ref 1.7–7.7)
Neutrophils Relative %: 65 %
Platelets: 75 K/uL — ABNORMAL LOW (ref 150–400)
RBC: 3.36 MIL/uL — ABNORMAL LOW (ref 3.87–5.11)
RDW: 15.4 % (ref 11.5–15.5)
WBC: 2.6 K/uL — ABNORMAL LOW (ref 4.0–10.5)
nRBC: 0 % (ref 0.0–0.2)

## 2023-12-19 LAB — GLUCOSE, CAPILLARY
Glucose-Capillary: 208 mg/dL — ABNORMAL HIGH (ref 70–99)
Glucose-Capillary: 219 mg/dL — ABNORMAL HIGH (ref 70–99)

## 2023-12-19 MED ORDER — MIDODRINE HCL 10 MG PO TABS
10.0000 mg | ORAL_TABLET | Freq: Two times a day (BID) | ORAL | 2 refills | Status: DC
  Filled 2023-12-19: qty 60, 30d supply, fill #0

## 2023-12-19 MED ORDER — ADULT MULTIVITAMIN W/MINERALS CH
1.0000 | ORAL_TABLET | Freq: Every day | ORAL | 2 refills | Status: DC
  Filled 2023-12-19: qty 30, 30d supply, fill #0

## 2023-12-19 MED ORDER — LACTULOSE 10 GM/15ML PO SOLN
20.0000 g | Freq: Four times a day (QID) | ORAL | 2 refills | Status: DC
  Filled 2023-12-19: qty 3784, 32d supply, fill #0

## 2023-12-19 MED ORDER — PROSOURCE PLUS PO LIQD: 30.0000 mL | Freq: Three times a day (TID) | ORAL | Status: DC

## 2023-12-19 MED ORDER — GLUCERNA SHAKE PO LIQD: 237.0000 mL | Freq: Three times a day (TID) | ORAL | Status: DC

## 2023-12-19 MED ORDER — INSULIN ASPART 100 UNIT/ML IJ SOLN: 0.0000 [IU] | Freq: Three times a day (TID) | INTRAMUSCULAR | Status: DC

## 2023-12-19 MED ORDER — BISACODYL 5 MG PO TBEC
10.0000 mg | DELAYED_RELEASE_TABLET | Freq: Every day | ORAL | 0 refills | Status: DC | PRN
  Filled 2023-12-19: qty 30, 15d supply, fill #0

## 2023-12-19 MED ORDER — INSULIN REGULAR HUMAN (CONC) 500 UNIT/ML ~~LOC~~ SOPN
35.0000 [IU] | PEN_INJECTOR | Freq: Three times a day (TID) | SUBCUTANEOUS | Status: DC
  Filled 2023-12-19: qty 3

## 2023-12-19 MED ORDER — INSULIN ASPART 100 UNIT/ML IJ SOLN: 0.0000 [IU] | Freq: Every day | INTRAMUSCULAR | Status: DC

## 2023-12-19 NOTE — Telephone Encounter (Signed)
Noted thank you so much 

## 2023-12-19 NOTE — Progress Notes (Signed)
 Nutrition Follow-up  DOCUMENTATION CODES:   Not applicable  INTERVENTION:  -Continue 2g Na menu, regular texture, thin liquids -Continue ProSource Plus TID -Continue Glucerna TID -Encouraged adequate kcal/pro intake; ways to increase protein content of foods   NUTRITION DIAGNOSIS:   Increased nutrient needs related to chronic illness (cirrhosis) as evidenced by estimated needs.  Ongoing  GOAL:   Patient will meet greater than or equal to 90% of their needs  Progressing  MONITOR:   PO intake, Supplement acceptance  REASON FOR ASSESSMENT:   Consult Assessment of nutrition requirement/status  ASSESSMENT:   Pt with medical history significant of chronic hypotension, decompensated liver cirrhosis secondary to NASH with esophageal varices and portal hypertensive gastropathy, diabetes mellitus type 2, C. difficile colitis, anxiety, depression, GERD presents with abdominal pain and fatigue following a recent paracentesis.  Spoke to pt briefly as she sits edge of bed, fully dressed and ready to d/c. Pt with continued nausea, intermittent vomiting. No c/d or chewing/swallowing difficulties. Pt endorses continued difficulty with eating r/t very poor appetite, nausea, vomiting. Discussed need for increased kcal, pro intake as able. Discussed ways to increase kcal, pro content of foods. Discussed ONS/protein powder options at home. Pt does not do well with milky ONS, discussed juice-like protein powder, unflavored collagen powder. Discussed and encouraged small frequent meal pattern. Pt continues on Lactulose  QID, Last BM  9/7. Pt with weight loss of 7.4 kg x 6 months (8.3%); however, difficult to assess weight loss reliably r/t ascites and paracentesis procedures. Suspect some weight lost actual r/t very poor PO intake, increased needs. Encouraged pt to follow up with outpatient Dietitian for support. Pt denies additional questions/concerns at this time, will continue to monitor, RDN  available prn.   Labs BG 208-325 Na 131 Potassium 3.4 BUN 5 Calcium  8.7 Albumin  2.9 Ammonia 44 (9/6) H/H 9.8/30.2  Medications  (feeding supplement) PROSource Plus  30 mL Oral TID BM   bisacodyl   10 mg Oral Daily   feeding supplement (GLUCERNA SHAKE)  237 mL Oral TID BM   fentaNYL  (SUBLIMAZE ) injection  12.5 mcg Intravenous Once   furosemide   80 mg Oral Daily   insulin  aspart  0-5 Units Subcutaneous QHS   insulin  aspart  0-9 Units Subcutaneous TID WC   insulin  regular human CONCENTRATED  35 Units Subcutaneous TID WC   lactulose   20 g Oral Once   lactulose   20 g Oral QID   midodrine   10 mg Oral BID WC   montelukast   10 mg Oral QHS   multivitamin with minerals  1 tablet Oral Daily   pantoprazole   40 mg Oral Daily   polyethylene glycol powder  119 g Oral Once   rifaximin   550 mg Oral q AM   sodium chloride  flush  3 mL Intravenous Q12H   spironolactone   300 mg Oral Daily     Diet Order:   Diet Order             Diet - low sodium heart healthy           Diet 2 gram sodium Fluid consistency: Thin; Fluid restriction: 1200 mL Fluid  Diet effective now                   EDUCATION NEEDS:   Education needs have been addressed  Skin:  Skin Assessment: Reviewed RN Assessment  Last BM:  9/7  Height:   Ht Readings from Last 1 Encounters:  12/15/23 5' 6 (1.676 m)    Weight:  Wt Readings from Last 1 Encounters:  12/19/23 81.5 kg    Ideal Body Weight:  59.1 kg  BMI:  Body mass index is 29 kg/m.  Estimated Nutritional Needs:   Kcal:  2050-2250  Protein:  105-120 grams  Fluid:  1.2 L  Paeton Studer Daml-Budig, RDN, LDN Registered Dietitian Nutritionist RD Inpatient Contact Info in [place]

## 2023-12-19 NOTE — Inpatient Diabetes Management (Addendum)
 Inpatient Diabetes Program Recommendations  AACE/ADA: New Consensus Statement on Inpatient Glycemic Control (2015)  Target Ranges:  Prepandial:   less than 140 mg/dL      Peak postprandial:   less than 180 mg/dL (1-2 hours)      Critically ill patients:  140 - 180 mg/dL   Lab Results  Component Value Date   GLUCAP 208 (H) 12/19/2023   HGBA1C 6.1 (H) 10/05/2023    Review of Glycemic Control  Latest Reference Range & Units 12/18/23 08:08 12/18/23 12:14 12/18/23 16:43 12/18/23 21:34 12/19/23 07:37  Glucose-Capillary 70 - 99 mg/dL 862 (H) 778 (H) 674 (H) 262 (H) 208 (H)   Diabetes history: DM 2 Outpatient Diabetes medications:  U500- 60 units with breakfast, 65 units with lunch, and 65 units with dinner Current orders for Inpatient glycemic control:  Novolog  0-20 units tid with meals and HS Novolog  30 units tid with meals Lantus  10 units daily  Inpatient Diabetes Program Recommendations:    Note that patient is on large doses of Novolog .  Recommend d/c of Novolog  30 units tid with meals and Lantus .  Instead, consider U500 35 units tid with meals (this is 1/2 of home meds).   Addendum 1330-  Spoke to patient by phone.  She confirms that she is taking U500 insulin  at home per above.  She wears a CGM at home and states that she sometimes reduces the dose of U500 insulin  if she is not eating as well.    Thanks,  Randall Bullocks, RN, BC-ADM Inpatient Diabetes Coordinator Pager [phone]  (8a-5p)

## 2023-12-19 NOTE — Telephone Encounter (Signed)
-----   Message from Greig Corti sent at 12/19/2023 12:56 PM EDT ----- Patient of Dr. Vangie be discharged from the hospital today admitted with hepatic encephalopathy and decompensated cirrhosis now requiring paracenteses every 7 to 10 days.  She apparently has had FMLA paperwork sent to the office and says she desperately needs this filled out or she will lose her job   Also she needs to be scheduled for a large-volume paracentesis either next Monday or Tuesday as early in the morning as possible.  On the orders may remove 6 to 7 L, and will need an albumin  infusion 100 g.  She also would like to get set up for serial paracenteses through IR so that she can call and schedule as needed I believe the order set for that has to come through the office.  Bari is working on a follow-up appointment with Dr. Legrand, he has follow-up with [facility] on 01/11/2024 and has appointment later this week [facility] beginning transplant eval  Thank you for all of your help

## 2023-12-19 NOTE — Progress Notes (Addendum)
 Reviewed AVS, patient expressed understanding of medications, MD follow up reviewed.    Patient states all belongings brought to the hospital at time of admission are accounted for and packed to take home.   Patient informed and expressed understanding where to pick up discharge medications.   [facility] contacted to transport patient to entrance A, where family member was waiting in vehicle to transport home.

## 2023-12-19 NOTE — Progress Notes (Cosign Needed)
 Patient ID: Laurie Sutton, female   DOB: 1981-04-04, 43 y.o.   MRN: [id number]    Progress Note   Subjective   Day # 5 CC;Decompensated cirrhosis with ascites  LVP earlier this admission no evidence for SBP Worsening abdominal pain yesterday> repeat CT  Repeat CT abdomen pelvis 12/18/2023-cirrhotic appearing liver no ductal dilation, calcified gallstones no evidence of acute cholecystitis, normal-appearing pancreas, mild diffuse colonic wall thickening likely due to portal colopathy, patent splenic vein SMV and portal vein  Labs today-WBC 2.6/hemoglobin 9.8/hematocrit 30.2/platelets 75 Sodium 131/potassium 3.4/BUN 5/creatinine 0.8  Says she feels quite a bit better today than she did yesterday and is not having any ongoing abdominal pain she is having some pain that she feels more in her right back that seems to be positional.  She is hoping to be able to be discharged today-orts that she was supposed to be getting set up for serial paracenteses through [facility] but apparently that has not occurred as yet.  She says she has been requiring a paracentesis about every 10 days  Did have several bowel movements starting yesterday evening.   Objective   Vital signs in last 24 hours: Temp:  [97.8 F (36.6 C)-98.8 F (37.1 C)] 98.3 F (36.8 C) (09/08 0735) Pulse Rate:  [64-76] 73 (09/08 0735) Resp:  [16-18] 16 (09/08 0013) BP: (97-109)/(33-52) 100/49 (09/08 0735) SpO2:  [98 %-100 %] 98 % (09/08 0735) Weight:  [81.5 kg] 81.5 kg (09/08 0417) Last BM Date : 12/18/23 General: Young white female in NAD Heart:  Regular rate and rhythm; no murmurs Lungs: Respirations even and unlabored, lungs CTA bilaterally Abdomen:  Soft, nontense ascites nontender normal bowel sounds. Extremities:  Without edema. Neurologic:  Alert and oriented,  grossly normal neurologically.  No asterixis Psych:  Cooperative. Normal mood and affect.  Intake/Output from previous day: 09/07 0701 - 09/08  0700 In: 237 [P.O.:237] Out: -  Intake/Output this shift: Total I/O In: 3 [I.V.:3] Out: -   Lab Results: Recent Labs    12/17/23 0231 12/18/23 0223 12/19/23 0426  WBC 1.9* 2.5* 2.6*  HGB 8.4* 9.2* 9.8*  HCT 26.3* 28.1* 30.2*  PLT 62* 67* 75*   BMET Recent Labs    12/17/23 0231 12/18/23 0223 12/19/23 0426  NA 133* 130* 131*  K 3.5 3.7 3.4*  CL 99 97* 96*  CO2 24 23 25   GLUCOSE 270* 267* 209*  BUN <5* <5* 5*  CREATININE 0.85 0.80 0.80  CALCIUM  8.5* 8.5* 8.7*   LFT Recent Labs    12/17/23 0231  PROT 6.1*  ALBUMIN  2.9*  AST 36  ALT 21  ALKPHOS 75  BILITOT 3.0*   PT/INR No results for input(s): LABPROT, INR in the last 72 hours.  Studies/Results: CT ABDOMEN PELVIS W CONTRAST Result Date: 12/18/2023 CLINICAL DATA:  Acute abdominal pain, history of liver failure EXAM: CT ABDOMEN AND PELVIS WITH CONTRAST TECHNIQUE: Multidetector CT imaging of the abdomen and pelvis was performed using the standard protocol following bolus administration of intravenous contrast. RADIATION DOSE REDUCTION: This exam was performed according to the departmental dose-optimization program which includes automated exposure control, adjustment of the mA and/or kV according to patient size and/or use of iterative reconstruction technique. CONTRAST:  75mL OMNIPAQUE  IOHEXOL  350 MG/ML SOLN COMPARISON:  12/15/2023 FINDINGS: Lower chest: No acute pleural or parenchymal lung disease. Hepatobiliary: Cirrhotic morphology of the liver again noted unchanged. No biliary duct dilation. Calcified gallstones are again seen without evidence of acute cholecystitis. Pancreas: Unremarkable. No pancreatic  ductal dilatation or surrounding inflammatory changes. Spleen: Stable splenomegaly, measuring 19.0 x 17.7 x 7.7 cm. No focal abnormality. Adrenals/Urinary Tract: Adrenal glands are unremarkable. Kidneys are normal, without renal calculi, focal lesion, or hydronephrosis. Bladder is decompressed, limiting its  evaluation. Stomach/Bowel: No bowel obstruction or ileus. Normal appendix right lower quadrant. Mild diffuse colonic wall thickening likely due to portal colopathy given the presence of cirrhosis and marked ascites. Vascular/Lymphatic: Dilated main portal vein consistent with portal venous hypertension. The splenic vein, SMV, and portal vein are patent. Sequela of portal venous hypertension with recanalization of the umbilical vein and numerous gastric and esophageal varices. No pathologic adenopathy. Reproductive: Status post hysterectomy. No adnexal masses. Other: Moderate ascites, slightly decreased since prior study. No free intraperitoneal gas. No abdominal wall hernia. Musculoskeletal: No acute or destructive bony abnormalities. Reconstructed images demonstrate no additional findings. IMPRESSION: 1. Cirrhosis, with portal venous hypertension manifested by moderate ascites, splenomegaly, and upper abdominal varices. 2. Mild diffuse colonic wall thickening, favor portal colopathy given findings of cirrhosis and ascites. 3. Cholelithiasis without cholecystitis. Electronically Signed   By: Ozell Daring M.D.   On: 12/18/2023 19:45       Assessment / Plan:    #25 43 year old female with decompensated [name] with esophageal varices, and chronic ascites now requiring frequent paracenteses, history of hepatic encephalopathy.  Admitted 4 days ago with recurrent hepatic encephalopathy now resolved and complaints of abdominal pain  MEL D3.0= 22  Status post large-volume paracentesis 12/16/1998 25-6.4 L removed, counts not consistent with SBP  Worsening abdominal pain yesterday much improved today, she also was given increasing laxative dosing and has had bowel movements which has helped.  #2 asymptomatic cholelithiasis #3 pancytopenia-stable  Plan; patient is okay for discharge to home today from GI perspective Have increased her lactulose  to 4 times daily and will need to continue on 4 times  daily dosing at home Add Dulcolax 10 mg at home daily or every other day as needed if not having 2-3 bowel movements per day Bentyl  discontinued  Continue her current diuretic regimen  He has follow-up later this week at [facility] regarding transplant evaluation and follow-up with [facility] here in [place] on 01/11/2024  I will ask our office to get her set up for large-volume paracentesis next Monday or Tuesday remove up to 7 L, with albumin  infusion.  We can also go on getting her set up through IR for serial paracenteses. Arrange for outpatient follow-up with Dr. Legrand, patient is also asking for FMLA paperwork to be filled out will defer to Dr. Legrand if he is amenable.   Principal Problem:   Cirrhosis of liver (HCC) Active Problems:   Anxiety and depression   GERD (gastroesophageal reflux disease)   Pancytopenia (HCC)   Controlled type 2 diabetes mellitus with complication, with long-term current use of insulin  (HCC)   Constipation   Hepatic encephalopathy (HCC)   Cholelithiasis   Chronic hypotension     LOS: 2 days   Kaylianna Detert PA-C 12/19/2023, 10:46 AM   Attending physician's note   I personally saw the patient and performed a substantive portion of the medical decision making process for this encounter (including a complete performance of the key components : MDM, Hx and Exam), in conjunction with the APP.  I agree with the APP's note, impression, and  the management plan for the number and complexity of problems addressed at the encounter for the patient and take responsibility for that plan with its inherent risk of complications,  morbidity, or mortality with additional input as follows.      Decompensated cirrhosis with ascites, paracentesis negative for SBP Abdominal pain improving  Asymptomatic cholelithiasis  Cirrhosis of liver with MELD 22  Continue current diuretic regimen Continue lactulose  for hepatic encephalopathy  Follow-up with  outpatient GI and [facility]    K. Veena Nandigam , MD [phone]

## 2023-12-19 NOTE — Discharge Summary (Signed)
 Physician Discharge Summary  Laurie Sutton FMW:[id number] DOB: March 01, 1981 DOA: 12/15/2023  PCP: [name], MD  Admit date: 12/15/2023 Discharge date: 12/19/2023  Admitted from: Home Discharge disposition: Home  Recommendations at discharge:  Continue lactulose  4 times daily at home.   Continue Dulcolax 10 mg daily or every other day as needed if not having 2-3 bowel movements a day.   Follow-up with liver clinic as an outpatient.  Also follow-up with [facility]  Brief narrative: Laurie Sutton is a 43 y.o. female with PMH significant for MASH cirrhosis with esophageal varices, portal hypertensive gastropathy, recurrent hospitalization for hepatic encephalopathy; also with h/o DM2, C. difficile colitis, GERD, anxiety/depression. Recently hospitalized 8/8 to 8/12 for rectal bleeding which turned out to be hemorrhoidal. Patient follows up with [facility] in [place] and [facility] in [place] and is in waiting list for liver transplantation. She has been getting large-volume paracentesis about every 7 to 10 days, last paracentesis on Friday 8/29. The procedure went uneventful but she has not felt well since. On the night of 9/3, patient started feeling short of breath and felt like her abdomen is distended more pressing on her chest and hence presented to ED.  In the ED, patient was afebrile although she reported a fever of 100.6 at home Blood pressure in low normal range Initial labs showed WC count of 2.9, hemoglobin 10.7, platelet 85, sodium 131, total bilirubin 3.1, ammonia 73, lactic acid 3 Urinalysis unremarkable Blood culture was collected Chest x-ray unremarkable CT abdomen/pelvis showed -Liver cirrhosis with abnormally dilated main portal vein up to 2.3 cm and moderately enlarged spleen approximately 17 cm -Multiple stones in the gallbladder.  Admitted to [facility] GI consulted Peritoneal fluid analysis showed hazy appearing fluid with WBC  count not elevated  Subjective: Patient was seen and examined this morning.   Pleasant young Caucasian female.   She is more awake today.  Tremors improving as well. Had 3 bowel movements since yesterday. Tremors improving as well. GI follow-up appreciated.  Okay to discharge home today  Hospital course: Decompensated liver cirrhosis 2/2 MASH Hepatic encephalopathy Presented with worsening generalized fatigue and abdominal pain since recent paracentesis.  Also reported fever at home  CT imaging as above  Labs showed elevated ammonia, elevated bilirubin  Seen by GI. Peritoneal fluid analysis ruled out SBP. She continued to complain of abdominal pain and hence CT abdomen was obtained which did not show any acute intra-abdominal findings. Nutritionist consult appreciated.  Patient did not have bowel movement for several days.  Had 3 yesterday with lactulose .   Continue lactulose  4 times daily at home.  Also advised to take Dulcolax 10 mg daily or every other day as needed if not having 2-3 bowel movements a day.  Bentyl  has been discontinued. Recent Labs  Lab 12/15/23 0354 12/17/23 0231  AMMONIA 73* 44*   Portal hypertension Ascites Chronic hypertension Underwent 6.5 L of paracentesis yesterday 9/5. Currently on sodium restricted diet PTA meds- Lasix  80 mg daily, Aldactone  300 mg daily, midodrine  5 mg daily Continued on Lasix  and Aldactone  at those doses Midodrine  dose being escalated.  Currently on midodrine  10 mg twice daily today.  To continue same at home.  Cholelithiasis CT abdomen showed multiple gallstones without cholecystitis.     Chronic pancytopenia Secondary to liver disease.   Slight depression in all cell lines in follow-up CBC today.  No active bleeding. H/o esophageal varices but no current evidence of bleeding. Continue PPI Recent  Labs  Lab 12/15/23 0354 12/15/23 0359 12/16/23 0247 12/17/23 0231 12/18/23 0223 12/19/23 0426  WBC 2.9*  --  2.3* 1.9*  2.5* 2.6*  NEUTROABS  --   --   --  1.1* 1.7 1.7  HGB 10.7* 11.2* 9.1* 8.4* 9.2* 9.8*  HCT 33.1* 33.0* 27.9* 26.3* 28.1* 30.2*  MCV 92.5  --  92.7 92.0 90.6 89.9  PLT 85*  --  71* 62* 67* 75*   Hyponatremia Sodium level slightly low.  Hypervolemic hyponatremia likely due to his liver cirrhosis. Recent Labs  Lab 12/15/23 0354 12/15/23 0359 12/16/23 0247 12/17/23 0231 12/18/23 0223 12/19/23 0426  NA 131* 135 136 133* 130* 131*   Type 2 diabetes mellitus Hyperglycemia A1c 6.1 on 10/05/2023 PTA meds-Humulin  R U-500 60 units 3 times daily Continue to monitor blood sugar level at home and continue insulin  regimen.  Anxiety and depression Continue Prozac    GERD Continue PPI  Goals of care   Code Status: Full Code   Diet:  Diet Order             Diet - low sodium heart healthy           Diet 2 gram sodium Fluid consistency: Thin; Fluid restriction: 1200 mL Fluid  Diet effective now                   Nutritional status:  Body mass index is 29 kg/m.  Nutrition Problem: Increased nutrient needs Etiology: chronic illness (cirrhosis) Signs/Symptoms: estimated needs  Wounds:  - Incision - 2 Ports Abdomen Umbilicus Mid;Lower (Active)  Placement Date/Time: 11/17/20 0756   Location of Ports: Abdomen  Location Orientation: Umbilicus  Location Orientation: Mid;Lower    Assessments 11/17/2020  9:46 AM 11/18/2020  8:15 AM  Port 1 Site Assessment -- [employer] 1 Margins -- Attached edges (approximated)  Port 1 Drainage Amount -- None  Port 1 Dressing Type Liquid skin adhesive Liquid skin adhesive  Port 1 Dressing Status Clean;Dry;Intact Clean;Dry;Intact  Port 2 Site Assessment -- [employer] 2 Margins -- Attached edges (approximated)  Port 2 Drainage Amount -- None  Port 2 Dressing Type Liquid skin adhesive Liquid skin adhesive  Port 2 Dressing Status Clean;Dry;Intact Clean;Dry;Intact     No associated orders.    Discharge Exam:   Vitals:   12/19/23 0013  12/19/23 0417 12/19/23 0735 12/19/23 1148  BP: (!) 98/50 (!) 99/42 (!) 100/49 (!) 111/51  Pulse: 76 71 73 75  Resp: 16     Temp: 98.8 F (37.1 C) 98.4 F (36.9 C) 98.3 F (36.8 C) 98.5 F (36.9 C)  TempSrc: Oral Oral    SpO2: 100% 100% 98% 100%  Weight:  81.5 kg    Height:        Body mass index is 29 kg/m.  General exam: Pleasant, young Caucasian female. Skin: No rashes, lesions or ulcers. HEENT: Atraumatic, normocephalic, no obvious bleeding Lungs: Clear to auscultation bilaterally,  CVS: S1, S2, no murmur,   GI/Abd: Soft, nontender, abdominal distention improved after paracentesis, bowel sound present,   CNS: Mental status improved.  No tremors today Psychiatry: Mood appropriate Extremities: No pedal edema, no calf tenderness,   Follow ups:    Follow-up Information     [name], MD Follow up.   Specialty: Family Medicine Contact information: [address] [place] [address] [phone]                 Discharge Instructions:   Discharge  Instructions     Call MD for:  difficulty breathing, headache or visual disturbances   Complete by: As directed    Call MD for:  extreme fatigue   Complete by: As directed    Call MD for:  hives   Complete by: As directed    Call MD for:  persistant dizziness or light-headedness   Complete by: As directed    Call MD for:  persistant nausea and vomiting   Complete by: As directed    Call MD for:  severe uncontrolled pain   Complete by: As directed    Call MD for:  temperature >100.4   Complete by: As directed    Diet - low sodium heart healthy   Complete by: As directed    Discharge instructions   Complete by: As directed    Recommendations at discharge:   Continue lactulose  4 times daily at home.    Continue Dulcolax 10 mg daily or every other day as needed if not having 2-3 bowel movements a day.    Follow-up with liver clinic as an outpatient.  Also follow-up with [facility]  General  discharge instructions: Follow with Primary MD [name], MD in 7 days  Please request your PCP  to go over your hospital tests, procedures, radiology results at the follow up. Please get your medicines reviewed and adjusted.  Your PCP may decide to repeat certain labs or tests as needed. Do not drive, operate heavy machinery, perform activities at heights, swimming or participation in water activities or provide baby sitting services if your were admitted for syncope or siezures until you have seen by Primary MD or a Neurologist and advised to do so again. [place]  Controlled Substance Reporting System database was reviewed. Do not drive, operate heavy machinery, perform activities at heights, swim, participate in water activities or provide baby-sitting services while on medications for pain, sleep and mood until your outpatient physician has reevaluated you and advised to do so again.  You are strongly recommended to comply with the dose, frequency and duration of prescribed medications. Activity: As tolerated with Full fall precautions use walker/cane & assistance as needed Avoid using any recreational substances like cigarette, tobacco, alcohol, or non-prescribed drug. If you experience worsening of your admission symptoms, develop shortness of breath, life threatening emergency, suicidal or homicidal thoughts you must seek medical attention immediately by calling 911 or calling your MD immediately  if symptoms less severe. You must read complete instructions/literature along with all the possible adverse reactions/side effects for all the medicines you take and that have been prescribed to you. Take any new medicine only after you have completely understood and accepted all the possible adverse reactions/side effects.  Wear Seat belts while driving. You were cared for by a hospitalist during your hospital stay. If you have any questions about your discharge medications or the care you  received while you were in the hospital after you are discharged, you can call the unit and ask to speak with the hospitalist or the covering physician. Once you are discharged, your primary care physician will handle any further medical issues. Please note that NO REFILLS for any discharge medications will be authorized once you are discharged, as it is imperative that you return to your primary care physician (or establish a relationship with a primary care physician if you do not have one).   Increase activity slowly   Complete by: As directed        Discharge  Medications:   Allergies as of 12/19/2023       Reactions   Vancomycin  Itching   Amoxicillin Hives, Itching   Azithromycin     DOES NOT WORK   Dulaglutide  Other (See Comments)   constipation and stomach issues  **Trulicity **   Empagliflozin -metformin  Hcl Er Other (See Comments), Swelling   Januvia  [sitagliptin ] Other (See Comments)   HURTS STOAMCH   Latex    IRRITATES VAGINAL AREA   Metformin     Other Reaction(s): achy all over   Penicillins Hives   Has patient had a PCN reaction causing immediate rash, facial/tongue/throat swelling, SOB or lightheadedness with hypotension: yes. Rash  Has patient had a PCN reaction causing severe rash involving mucus membranes or skin necrosis: Yes- rash and hives all over body  Has patient had a PCN reaction that required hospitalization No Has patient had a PCN reaction occurring within the last 10 years: Yes  If all of the above answers are NO, then may proceed with Cephalosporin use.   Vitamin K  And Related Other (See Comments)   Electric shock         Medication List     TAKE these medications    (feeding supplement) PROSource Plus liquid Take 30 mLs by mouth 3 (three) times daily between meals.   feeding supplement (GLUCERNA SHAKE) Liqd Take 237 mLs by mouth 3 (three) times daily between meals.   acetaminophen  325 MG tablet Commonly known as: TYLENOL  Take 2 tablets  (650 mg total) by mouth every 6 (six) hours as needed for mild pain (pain score 1-3) or fever (or Fever >/= 101).   albuterol  108 (90 Base) MCG/ACT inhaler Commonly known as: VENTOLIN  HFA INHALE 2 PUFFS INTO THE LUNGS UP TO EVERY 6 HOURS AS NEEDED FOR WHEEZING/SHORTNESS OF BREATH   bisacodyl  5 MG EC tablet Commonly known as: DULCOLAX Take 2 tablets (10 mg total) by mouth daily as needed for moderate constipation.   calcium  carbonate 500 MG chewable tablet Commonly known as: TUMS - dosed in mg elemental calcium  Chew 2 tablets by mouth as needed for indigestion or heartburn.   clotrimazole -betamethasone  cream Commonly known as: LOTRISONE  APPLY 1 APPLICATION TOPICALLY TWICE A DAY AS NEEDED   Dexcom G7 Sensor Misc Use 1 sensor for continuous glucose monitoring every 10 days for 30 days   FLUoxetine  40 MG capsule Commonly known as: PROZAC  TAKE 1 CAPSULE BY MOUTH TWICE A DAY What changed:  how much to take when to take this   furosemide  80 MG tablet Commonly known as: LASIX  Take 1 tablet (80 mg total) by mouth daily.   HumuLIN  R U-500 KwikPen 500 UNIT/ML KwikPen Generic drug: insulin  regular human CONCENTRATED Use 120 units at breakfast, 100 at lunch, and 100 at dinner What changed: additional instructions   hydrocortisone  2.5 % cream Use 3 (three) times daily rectally What changed:  how much to take how to take this when to take this reasons to take this   lactulose  10 GM/15ML solution Commonly known as: CHRONULAC  Take 30 mLs (20 g total) by mouth 4 (four) times daily.   midodrine  10 MG tablet Commonly known as: PROAMATINE  Take 1 tablet (10 mg total) by mouth 2 (two) times daily with a meal. What changed:  medication strength how much to take   montelukast  10 MG tablet Commonly known as: SINGULAIR  Take 1 tablet (10 mg total) by mouth at bedtime.   multivitamin with minerals Tabs tablet Take 1 tablet by mouth daily. Start taking on: December 20, 2023    omeprazole  40 MG capsule Commonly known as: PRILOSEC TAKE 1 CAPSULE (40 MG TOTAL) BY MOUTH IN THE MORNING   ondansetron  4 MG tablet Commonly known as: Zofran  Take 1 tablet (4 mg total) by mouth every 8 (eight) hours as needed for nausea or vomiting.   spironolactone  100 MG tablet Commonly known as: ALDACTONE  Take 3 tablets (300 mg total) by mouth daily.   Vitamin D  (Ergocalciferol ) 1.25 MG (50000 UNIT) Caps capsule Commonly known as: DRISDOL  Take 50,000 Units by mouth every 7 (seven) days. No specific day   Xifaxan  550 MG Tabs tablet Generic drug: rifaximin  Take 550 mg by mouth in the morning.         The results of significant diagnostics from this hospitalization (including imaging, microbiology, ancillary and laboratory) are listed below for reference.    Procedures and Diagnostic Studies:   IR Paracentesis Result Date: 12/16/2023 INDICATION: 43 year old female with abdominal distention, recurrent ascites. History of decompensated MASH cirrhosis with hepatic encephalopathy EXAM: ULTRASOUND GUIDED THERAPEUTIC PARACENTESIS MEDICATIONS: 10 mL 1% lidocaine  COMPLICATIONS: None immediate. PROCEDURE: Informed written consent was obtained from the patient after a discussion of the risks, benefits and alternatives to treatment. A timeout was performed prior to the initiation of the procedure. Initial ultrasound scanning demonstrates a large amount of ascites within the right lower abdominal quadrant. The right lower abdomen was prepped and draped in the usual sterile fashion. 1% lidocaine  was used for local anesthesia. Following this, a 19 gauge, 7-cm, Yueh catheter was introduced. An ultrasound image was saved for documentation purposes. The paracentesis was performed. The catheter was removed and a dressing was applied. The patient tolerated the procedure well without immediate post procedural complication. Patient received post-procedure intravenous albumin ; see nursing notes for details.  FINDINGS: A total of approximately 6.4 L of clear, yellow fluid was removed. Samples were sent to the laboratory as requested by the clinical team. IMPRESSION: Successful ultrasound-guided paracentesis yielding 6.4 L of peritoneal fluid. Performed by: Kacie Matthews PA-C PLAN: The patient is followed at [facility] for her liver care, and was undergoing workup for potential liver transplant. Thom Hall, MD [facility] Electronically Signed   By: Thom Hall M.D.   On: 12/16/2023 16:14   CT ABDOMEN PELVIS W CONTRAST Addendum Date: 12/16/2023 EXAM: CT ABDOMEN AND PELVIS WITH CONTRAST 12/15/2023 05:22:57 AM TECHNIQUE: CT of the abdomen and pelvis was performed with the administration of intravenous contrast. Multiplanar reformatted images are provided for review. Automated exposure control, iterative reconstruction, and/or weight-based adjustment of the mA/kV was utilized to reduce the radiation dose to as low as reasonably achievable. COMPARISON: CT of the abdomen and pelvis dated 11/18/2023. CLINICAL HISTORY: Abdominal pain, acute, nonlocalized; abdominal pain in setting of liver failure patient with ascites. 75cc [id number]; Pt states that she is in liver failure and is on liver transplant list. Pt reports that she had paracentesis done on Friday and hasn't felt well since then. Pt reports increased SOB that began last night. Pt is jaundice and appears tachypnic. FINDINGS: LOWER CHEST: Minimal dependent atelectasis within the lower lobes. LIVER: The liver demonstrates cirrhotic morphology. No lesions are evident. GALLBLADDER AND BILE DUCTS: Multiple stones are seen within the gallbladder. No biliary ductal dilatation. SPLEEN: The spleen is moderately enlarged, measuring approximately 17 cm in length. No focal lesion. PANCREAS: No mass. No ductal dilatation. ADRENAL GLANDS: Normal appearance. No mass. KIDNEYS, URETERS AND BLADDER: No stones in the kidneys  or ureters.  No hydronephrosis. No perinephric or periureteral stranding. Urinary bladder is unremarkable. GI AND BOWEL: Stomach demonstrates no acute abnormality. There is no bowel obstruction. No bowel wall thickening. PERITONEUM AND RETROPERITONEUM: No ascites. No free air. VASCULATURE: The main portal vein is abnormally dilated, measuring up to 2.3 cm in diameter. LYMPH NODES: No lymphadenopathy. REPRODUCTIVE ORGANS: The patient is status post hysterectomy. BONES AND SOFT TISSUES: No acute osseous abnormality. There is a small periumbilical hernia. IMPRESSION: 1. Cirrhotic liver morphology with abnormally dilated main portal vein (up to 2.3 cm) and moderately enlarged spleen (approximately 17 cm). 2. Multiple stones in the gallbladder. 3. Small periumbilical hernia. Electronically signed by: Evalene Coho MD 12/15/2023 05:34 AM EDT RP Workstation: [id number]   IR Paracentesis Result Date: 12/15/2023 INDICATION: Hx of decompensated NASH cirrhosis, ascites, hepatic encephalopathy. Newly admitted for recurrent hepatic encephalopathy and abdominal pain. IR consulted for diagnostic paracentesis. EXAM: ULTRASOUND GUIDED DIAGNOSTIC PARACENTESIS MEDICATIONS: 6 mL 1% lidocaine  COMPLICATIONS: None immediate. PROCEDURE: Informed written consent was obtained from the patient after a discussion of the risks, benefits and alternatives to treatment. A timeout was performed prior to the initiation of the procedure. Initial ultrasound scanning demonstrates a moderate amount of ascites within the right lower abdominal quadrant. The right lower abdomen was prepped and draped in the usual sterile fashion. 1% lidocaine  was used for local anesthesia. Following this, a 19 gauge, 7-cm, Yueh catheter was introduced. An ultrasound image was saved for documentation purposes. The paracentesis was performed. The catheter was removed and a dressing was applied. The patient tolerated the procedure well without immediate post procedural  complication. FINDINGS: A total of approximately 150 mL of clear yellow fluid was removed. Samples were sent to the laboratory as requested by the clinical team. IMPRESSION: Successful ultrasound-guided paracentesis yielding 150 mL of peritoneal fluid. Performed by: Wyatt Pommier, PA-C Electronically Signed   By: Ester Sides M.D.   On: 12/15/2023 16:05   DG Chest Portable 1 View Result Date: 12/15/2023 CLINICAL DATA:  Shortness of breath. History of liver failure requiring frequent paracentesis most recently 6 days ago. EXAM: PORTABLE CHEST 1 VIEW COMPARISON:  AP Lat chest 10/10/2023 FINDINGS: The heart size and mediastinal contours are within normal limits. Both lungs are clear with limited view of the bases due to low inspiration. The visualized skeletal structures are unremarkable. IMPRESSION: No active disease. Limited view of the bases due to low inspiration. Electronically Signed   By: Francis Quam M.D.   On: 12/15/2023 04:55     Labs:   Basic Metabolic Panel: Recent Labs  Lab 12/15/23 0354 12/15/23 0359 12/16/23 0247 12/17/23 0231 12/18/23 0223 12/19/23 0426  NA 131* 135 136 133* 130* 131*  K 3.6 3.7 3.8 3.5 3.7 3.4*  CL 99 100 104 99 97* 96*  CO2 19*  --  24 24 23 25   GLUCOSE 146* 139* 94 270* 267* 209*  BUN <5* 4* 7 <5* <5* 5*  CREATININE 0.97 0.90 0.83 0.85 0.80 0.80  CALCIUM  8.4*  --  8.2* 8.5* 8.5* 8.7*   GFR Estimated Creatinine Clearance: 98.6 mL/min (by C-G formula based on SCr of 0.8 mg/dL). Liver Function Tests: Recent Labs  Lab 12/15/23 0354 12/16/23 0247 12/17/23 0231  AST 47* 41 36  ALT 27 23 21   ALKPHOS 93 71 75  BILITOT 3.1* 3.0* 3.0*  PROT 7.1 6.0* 6.1*  ALBUMIN  2.9* 2.5* 2.9*   Recent Labs  Lab 12/15/23 0354  LIPASE 47   Recent Labs  Lab 12/15/23 0354 12/17/23 0231  AMMONIA 73* 44*   Coagulation profile Recent Labs  Lab 12/16/23 0247  INR 1.7*    CBC: Recent Labs  Lab 12/15/23 0354 12/15/23 0359 12/16/23 0247 12/17/23 0231  12/18/23 0223 12/19/23 0426  WBC 2.9*  --  2.3* 1.9* 2.5* 2.6*  NEUTROABS  --   --   --  1.1* 1.7 1.7  HGB 10.7* 11.2* 9.1* 8.4* 9.2* 9.8*  HCT 33.1* 33.0* 27.9* 26.3* 28.1* 30.2*  MCV 92.5  --  92.7 92.0 90.6 89.9  PLT 85*  --  71* 62* 67* 75*   Cardiac Enzymes: No results for input(s): CKTOTAL, CKMB, CKMBINDEX, TROPONINI in the last 168 hours. BNP: Invalid input(s): POCBNP CBG: Recent Labs  Lab 12/18/23 1214 12/18/23 1643 12/18/23 2134 12/19/23 0737 12/19/23 1148  GLUCAP 221* 325* 262* 208* 219*   D-Dimer No results for input(s): DDIMER in the last 72 hours. Hgb A1c No results for input(s): HGBA1C in the last 72 hours. Lipid Profile No results for input(s): CHOL, HDL, LDLCALC, TRIG, CHOLHDL, LDLDIRECT in the last 72 hours. Thyroid  function studies No results for input(s): TSH, T4TOTAL, T3FREE, THYROIDAB in the last 72 hours.  Invalid input(s): FREET3 Anemia work up No results for input(s): VITAMINB12, FOLATE, FERRITIN, TIBC, IRON, RETICCTPCT in the last 72 hours. Microbiology Recent Results (from the past 240 hours)  Blood culture (routine x 2)     Status: None (Preliminary result)   Collection Time: 12/15/23  4:24 AM   Specimen: BLOOD LEFT ARM  Result Value Ref Range Status   Specimen Description BLOOD LEFT ARM  Final   Special Requests   Final    BOTTLES DRAWN AEROBIC AND ANAEROBIC Blood Culture adequate volume   Culture   Final    NO GROWTH 4 DAYS Performed at [facility], [address], [place], [place] [address]    Report Status PENDING  Incomplete  Blood culture (routine x 2)     Status: None (Preliminary result)   Collection Time: 12/15/23  4:24 AM   Specimen: BLOOD LEFT ARM  Result Value Ref Range Status   Specimen Description BLOOD LEFT ARM  Final   Special Requests   Final    BOTTLES DRAWN AEROBIC AND ANAEROBIC Blood Culture adequate volume   Culture   Final    NO GROWTH 4 DAYS Performed at  [facility], [address], [place], [place] [address]    Report Status PENDING  Incomplete  Gram stain     Status: None   Collection Time: 12/15/23  4:05 PM   Specimen: Abdomen; Peritoneal Fluid  Result Value Ref Range Status   Specimen Description PERITONEAL  Final   Special Requests ABDOMEN  Final   Gram Stain   Final    NO WBC SEEN NO ORGANISMS SEEN Performed at [facility], [address] [place], [place] [address]    Report Status 12/15/2023 FINAL  Final    Time coordinating discharge: 45 minutes  Signed: Sabina Beavers  [facility] 12/19/2023, 1:18 PM

## 2023-12-19 NOTE — Plan of Care (Signed)

## 2023-12-19 NOTE — Telephone Encounter (Signed)
 Paracentesis order has been entered and sent to the schedulers   Laurie Sutton have you seen the FMLA paperwork?

## 2023-12-19 NOTE — Progress Notes (Signed)
 PROGRESS NOTE  Laurie Sutton  DOB: December 14, 1980  PCP: [name], MD FMW:[id number]  DOA: 12/15/2023  LOS: 2 days  Hospital Day: 5  Brief narrative: Laurie Sutton is a 43 y.o. female with PMH significant for MASH cirrhosis with esophageal varices, portal hypertensive gastropathy, recurrent hospitalization for hepatic encephalopathy; also with h/o DM2, C. difficile colitis, GERD, anxiety/depression. Recently hospitalized 8/8 to 8/12 for rectal bleeding which turned out to be hemorrhoidal. Patient follows up with [facility] in [place] and [facility] in [place] and is in waiting list for liver transplantation. She has been getting large-volume paracentesis about every 7 to 10 days, last paracentesis on Friday 8/29. The procedure went uneventful but she has not felt well since. On the night of 9/3, patient started feeling short of breath and felt like her abdomen is distended more pressing on her chest and hence presented to ED.  In the ED, patient was afebrile although she reported a fever of 100.6 at home Blood pressure in low normal range Initial labs showed WC count of 2.9, hemoglobin 10.7, platelet 85, sodium 131, total bilirubin 3.1, ammonia 73, lactic acid 3 Urinalysis unremarkable Blood culture was collected Chest x-ray unremarkable CT abdomen/pelvis showed -Liver cirrhosis with abnormally dilated main portal vein up to 2.3 cm and moderately enlarged spleen approximately 17 cm -Multiple stones in the gallbladder.  Admitted to [facility] GI consulted Peritoneal fluid analysis showed hazy appearing fluid with WBC count not elevated  Subjective: Patient was seen and examined this morning.  Pleasant young Caucasian female.   She is more awake today.  Tremors improving as well. Had 3 bowel movements since yesterday. Tremors improving as well.  Assessment and plan: Decompensated liver cirrhosis 2/2 MASH Hepatic encephalopathy Presented with  worsening generalized fatigue and abdominal pain since recent paracentesis.  Also reported fever at home  CT imaging as above  Labs showed elevated ammonia, elevated bilirubin  Seen by GI. Peritoneal fluid analysis ruled out SBP. Did not have bowel movement for several days.  Had 3 yesterday with lactulose  She continued to complain of abdominal pain and hence CT abdomen was obtained which did not show any acute intra-abdominal findings. Nutritionist consult appreciated Recent Labs  Lab 12/15/23 0354 12/17/23 0231  AMMONIA 73* 44*   Portal hypertension Ascites Chronic hypertension Underwent 6.5 L of paracentesis yesterday 9/5. Currently on sodium restricted diet PTA meds- Lasix  80 mg daily, Aldactone  300 mg daily, midodrine  5 mg daily Continued on Lasix  and Aldactone  at those doses Midodrine  dose being escalated.  Currently on midodrine  10 mg twice daily today.  Cholelithiasis CT abdomen showed multiple gallstones without cholecystitis.     Chronic pancytopenia Secondary to liver disease.   Slight depression in all cell lines in follow-up CBC today.  No active bleeding. H/o esophageal varices but no current evidence of bleeding. Continue PPI Recent Labs  Lab 12/15/23 0354 12/15/23 0359 12/16/23 0247 12/17/23 0231 12/18/23 0223 12/19/23 0426  WBC 2.9*  --  2.3* 1.9* 2.5* 2.6*  NEUTROABS  --   --   --  1.1* 1.7 1.7  HGB 10.7* 11.2* 9.1* 8.4* 9.2* 9.8*  HCT 33.1* 33.0* 27.9* 26.3* 28.1* 30.2*  MCV 92.5  --  92.7 92.0 90.6 89.9  PLT 85*  --  71* 62* 67* 75*   Hyponatremia Sodium level slightly low.  Hypervolemic hyponatremia likely due to his liver cirrhosis. Recent Labs  Lab 12/15/23 0354 12/15/23 9640 12/16/23 0247 12/17/23 0231 12/18/23 9776 12/19/23 9573  NA 131* 135 136 133* 130* 131*   Type 2 diabetes mellitus Hyperglycemia A1c 6.1 on 10/05/2023 PTA meds-Humulin  R U-500 60 units 3 times daily Blood sugar level persistently elevated Diabetes care  coordinator consult appreciated. Insulin  regimen adjusted today to U-500 35 units 3 times daily with sensitive sliding scale Recent Labs  Lab 12/18/23 1214 12/18/23 1643 12/18/23 2134 12/19/23 0737 12/19/23 1148  GLUCAP 221* 325* 262* 208* 219*   Anxiety and depression Continue Prozac    GERD Continue PPI   Mobility: Encourage ambulation  Goals of care   Code Status: Full Code     DVT prophylaxis:  SCDs Start: 12/15/23 0819   Antimicrobials: None Fluid: None Consultants: GI Family Communication: Family not at bedside  Status: Observation Level of care:  Telemetry Medical   Patient is from: Home Needs to continue in-hospital care: Ongoing titration of medicines. Anticipated d/c to: Home after clearance by GI.  This afternoon versus tomorrow    Diet:  Diet Order             Diet 2 gram sodium Fluid consistency: Thin; Fluid restriction: 1200 mL Fluid  Diet effective now                   Scheduled Meds:  (feeding supplement) PROSource Plus  30 mL Oral TID BM   bisacodyl   10 mg Oral Daily   feeding supplement (GLUCERNA SHAKE)  237 mL Oral TID BM   fentaNYL  (SUBLIMAZE ) injection  12.5 mcg Intravenous Once   furosemide   80 mg Oral Daily   insulin  aspart  0-5 Units Subcutaneous QHS   insulin  aspart  0-9 Units Subcutaneous TID WC   insulin  regular human CONCENTRATED  35 Units Subcutaneous TID WC   lactulose   20 g Oral Once   lactulose   20 g Oral QID   midodrine   10 mg Oral BID WC   montelukast   10 mg Oral QHS   multivitamin with minerals  1 tablet Oral Daily   pantoprazole   40 mg Oral Daily   polyethylene glycol powder  119 g Oral Once   rifaximin   550 mg Oral q AM   sodium chloride  flush  3 mL Intravenous Q12H   spironolactone   300 mg Oral Daily    PRN meds: acetaminophen  **OR** acetaminophen , albuterol , calcium  carbonate, hydrOXYzine , ondansetron  (ZOFRAN ) IV   Infusions:    Antimicrobials: Anti-infectives (From admission, onward)    Start      Dose/Rate Route Frequency Ordered Stop   12/15/23 0830  rifaximin  (XIFAXAN ) tablet 550 mg        550 mg Oral Every morning 12/15/23 0822         Objective: Vitals:   12/19/23 0735 12/19/23 1148  BP: (!) 100/49 (!) 111/51  Pulse: 73 75  Resp:    Temp: 98.3 F (36.8 C) 98.5 F (36.9 C)  SpO2: 98% 100%    Intake/Output Summary (Last 24 hours) at 12/19/2023 1201 Last data filed at 12/19/2023 0855 Gross per 24 hour  Intake 240 ml  Output --  Net 240 ml   Filed Weights   12/15/23 1623 12/18/23 0346 12/19/23 0417  Weight: 84.4 kg 82.3 kg 81.5 kg   Weight change: -0.8 kg Body mass index is 29 kg/m.   Physical Exam: General exam: Pleasant, young Caucasian female. Skin: No rashes, lesions or ulcers. HEENT: Atraumatic, normocephalic, no obvious bleeding Lungs: Clear to auscultation bilaterally,  CVS: S1, S2, no murmur,   GI/Abd: Soft, nontender, abdominal distention improved after  paracentesis, bowel sound present,   CNS: Mental status improved.  No tremors today Psychiatry: Sad affect Extremities: No pedal edema, no calf tenderness,   Data Review: I have personally reviewed the laboratory data and studies available.  F/u labs ordered Unresulted Labs (From admission, onward)     Start     Ordered   12/18/23 0500  CBC with Differential/Platelet  Daily,   R     Question:  Specimen collection method  Answer:  Lab=Lab collect   12/17/23 1038   12/18/23 0500  Basic metabolic panel with GFR  Daily,   R     Question:  Specimen collection method  Answer:  Lab=Lab collect   12/17/23 1038            Signed, Chapman Rota, MD [facility] 12/19/2023

## 2023-12-19 NOTE — Telephone Encounter (Signed)
 PCP is holding on to pt form in the red folder

## 2023-12-20 ENCOUNTER — Telehealth: Payer: Self-pay

## 2023-12-20 LAB — CULTURE, BLOOD (ROUTINE X 2)
Culture: NO GROWTH
Culture: NO GROWTH
Special Requests: ADEQUATE
Special Requests: ADEQUATE

## 2023-12-20 NOTE — Transitions of Care (Post Inpatient/ED Visit) (Signed)
   12/20/2023  Name: Laurie Sutton MRN: [id number] DOB: 06/26/1980  Today's TOC FU Call Status: Today's TOC FU Call Status:: Unsuccessful Call (1st Attempt) Unsuccessful Call (1st Attempt) Date: 12/20/23  Attempted to reach the patient regarding the most recent Inpatient/ED visit. Patient answered and states that she is at the dentist and to call back later.   Follow Up Plan: Additional outreach attempts will be made to reach the patient to complete the Transitions of Care (Post Inpatient/ED visit) call.   Alan Ee, RN, BSN, CEN [employer]- Transition of Care Team.  [facility] [phone]

## 2023-12-21 ENCOUNTER — Telehealth: Payer: Self-pay

## 2023-12-21 NOTE — Transitions of Care (Post Inpatient/ED Visit) (Signed)
   12/21/2023  Name: Laurie Sutton MRN: [id number] DOB: 09-Dec-1980  Today's TOC FU Call Status: Today's TOC FU Call Status:: Successful TOC FU Call Completed TOC FU Call Complete Date: 12/21/23 Patient's Name and Date of Birth confirmed.  Transition Care Management Follow-up Telephone Call Date of Discharge: 12/19/23 Discharge Facility: [facility]) Type of Discharge: Inpatient Admission How have you been since you were released from the hospital?: Same Any questions or concerns?: No  Items Reviewed: Did you receive and understand the discharge instructions provided?: Yes  Placed call to patient who states that she is ok. Reports that she understands her instructions and has all her medications as prescribed. Offered to review discharge instructions, medication and follow up. Patient reports that she wants her husband to be present but husband not home and patient does not know when husband will be home.  Offered to call back tomorrow and patient declined.    Complete TOC not done and assessment not done per patient decline.   Suggested patient call MD for any concerns. She voiced understanding.  Alan Ee, RN, BSN, CEN [employer]- Transition of Care Team.  [facility] [phone]

## 2023-12-22 NOTE — Telephone Encounter (Signed)
 I'm losing track here. I completed her husband's form a second time and I assume it was faxed back if it was not picked up.  If it needs the specific dates as she outlined, then I can do it one final time.   Have already spent considerable time on this.  - H. Legrand, MD

## 2023-12-23 ENCOUNTER — Ambulatory Visit (HOSPITAL_COMMUNITY)
Admission: RE | Admit: 2023-12-23 | Discharge: 2023-12-23 | Disposition: A | Discharge status: Home / Self Care | Source: Ambulatory Visit | Attending: Gastroenterology | Admitting: Gastroenterology

## 2023-12-23 DIAGNOSIS — K7460 Unspecified cirrhosis of liver: Secondary | ICD-10-CM | POA: Insufficient documentation

## 2023-12-23 DIAGNOSIS — K7581 Nonalcoholic steatohepatitis (NASH): Secondary | ICD-10-CM | POA: Insufficient documentation

## 2023-12-23 DIAGNOSIS — R188 Other ascites: Secondary | ICD-10-CM | POA: Diagnosis not present

## 2023-12-23 HISTORY — PX: IR PARACENTESIS: IMG2679

## 2023-12-23 MED ORDER — ALBUMIN HUMAN 25 % IV SOLN
INTRAVENOUS | Status: AC
  Filled 2023-12-23: qty 400

## 2023-12-23 MED ORDER — ALBUMIN HUMAN 25 % IV SOLN
100.0000 g | Freq: Once | INTRAVENOUS | Status: AC
  Administered 2023-12-23: 100 g via INTRAVENOUS

## 2023-12-23 MED ORDER — LIDOCAINE-EPINEPHRINE (PF) 1 %-1:200000 IJ SOLN: 10.0000 mL | Freq: Once | INTRAMUSCULAR | Status: DC

## 2023-12-23 MED ORDER — LIDOCAINE-EPINEPHRINE 1 %-1:100000 IJ SOLN
INTRAMUSCULAR | Status: AC
  Filled 2023-12-23: qty 1

## 2023-12-23 NOTE — Procedures (Signed)
 PROCEDURE SUMMARY:  Successful US  guided paracentesis from left lateral abdomen.  Yielded 5.8 liters of clear, yellow fluid.  No immediate complications.  Pt tolerated well.   Specimen was not sent for labs.  EBL < 5mL  Solmon Selmer Ku PA-C 12/23/2023 9:06 AM

## 2023-12-26 ENCOUNTER — Other Ambulatory Visit (HOSPITAL_COMMUNITY): Payer: Self-pay | Admitting: Nurse Practitioner

## 2023-12-26 DIAGNOSIS — R188 Other ascites: Secondary | ICD-10-CM

## 2023-12-27 ENCOUNTER — Encounter: Payer: Self-pay | Admitting: Gastroenterology

## 2023-12-27 MED ORDER — ONDANSETRON HCL 4 MG PO TABS: 4.0000 mg | ORAL_TABLET | Freq: Three times a day (TID) | ORAL | 3 refills | Status: DC | PRN

## 2023-12-27 NOTE — Addendum Note (Signed)
 Addended by: LEGRAND VICTORY CROME on: 12/27/2023 04:29 PM   Modules accepted: Orders

## 2023-12-29 MED ORDER — PROCHLORPERAZINE MALEATE 10 MG PO TABS: 10.0000 mg | ORAL_TABLET | Freq: Three times a day (TID) | ORAL | 2 refills | Status: DC | PRN

## 2023-12-29 NOTE — Telephone Encounter (Signed)
 12/29/23-No prior auth required for Echo for DOS 01/02/24 per [employer].  Call ref#[id number]

## 2023-12-29 NOTE — Addendum Note (Signed)
 Addended by: LEGRAND VICTORY CROME on: 12/29/2023 03:30 PM   Modules accepted: Orders

## 2023-12-30 ENCOUNTER — Ambulatory Visit (HOSPITAL_COMMUNITY)
Admission: RE | Admit: 2023-12-30 | Discharge: 2023-12-30 | Disposition: A | Discharge status: Home / Self Care | Source: Ambulatory Visit | Attending: Nurse Practitioner | Admitting: Nurse Practitioner

## 2023-12-30 DIAGNOSIS — Z8719 Personal history of other diseases of the digestive system: Secondary | ICD-10-CM | POA: Diagnosis not present

## 2023-12-30 DIAGNOSIS — R188 Other ascites: Secondary | ICD-10-CM | POA: Insufficient documentation

## 2023-12-30 HISTORY — PX: IR PARACENTESIS: IMG2679

## 2023-12-30 MED ORDER — ALBUMIN HUMAN 25 % IV SOLN
12.5000 g | Freq: Once | INTRAVENOUS | Status: AC
  Administered 2023-12-30: 12.5 g via INTRAVENOUS

## 2023-12-30 MED ORDER — LIDOCAINE-EPINEPHRINE 1 %-1:100000 IJ SOLN
20.0000 mL | Freq: Once | INTRAMUSCULAR | Status: AC
  Administered 2023-12-30: 10 mL via INTRADERMAL

## 2023-12-30 MED ORDER — ALBUMIN HUMAN 25 % IV SOLN
25.0000 g | Freq: Once | INTRAVENOUS | Status: AC
Start: 2023-12-30 — End: 2023-12-30
  Administered 2023-12-30: 25 g via INTRAVENOUS

## 2023-12-30 MED ORDER — ALBUMIN HUMAN 25 % IV SOLN
INTRAVENOUS | Status: AC
  Filled 2023-12-30: qty 150

## 2023-12-30 MED ORDER — LIDOCAINE-EPINEPHRINE 1 %-1:100000 IJ SOLN
INTRAMUSCULAR | Status: AC
  Filled 2023-12-30: qty 1

## 2023-12-30 NOTE — Procedures (Signed)
 PROCEDURE SUMMARY:  Successful image-guided paracentesis from the left abdomen.  Yielded 6.0 liters of clear, straw-colored peritoneal fluid.  No immediate complications.  EBL: zero Patient tolerated well.   Please see imaging section of Epic for full dictation.  Carlin LABOR Paraskevi Funez PA-C 12/30/2023 10:21 AM

## 2024-01-03 ENCOUNTER — Ambulatory Visit: Admitting: Family Medicine

## 2024-01-03 ENCOUNTER — Encounter: Payer: Self-pay | Admitting: Family Medicine

## 2024-01-03 VITALS — BP 96/54 | HR 92 | Temp 99.0°F | Wt 180.0 lb

## 2024-01-03 DIAGNOSIS — N390 Urinary tract infection, site not specified: Secondary | ICD-10-CM | POA: Diagnosis not present

## 2024-01-03 DIAGNOSIS — Z794 Long term (current) use of insulin: Secondary | ICD-10-CM

## 2024-01-03 DIAGNOSIS — E118 Type 2 diabetes mellitus with unspecified complications: Secondary | ICD-10-CM

## 2024-01-03 DIAGNOSIS — K766 Portal hypertension: Secondary | ICD-10-CM | POA: Diagnosis not present

## 2024-01-03 DIAGNOSIS — G2581 Restless legs syndrome: Secondary | ICD-10-CM

## 2024-01-03 DIAGNOSIS — K7581 Nonalcoholic steatohepatitis (NASH): Secondary | ICD-10-CM

## 2024-01-03 DIAGNOSIS — K7460 Unspecified cirrhosis of liver: Secondary | ICD-10-CM

## 2024-01-03 DIAGNOSIS — Z23 Encounter for immunization: Secondary | ICD-10-CM | POA: Diagnosis not present

## 2024-01-03 DIAGNOSIS — D732 Chronic congestive splenomegaly: Secondary | ICD-10-CM

## 2024-01-03 DIAGNOSIS — D61818 Other pancytopenia: Secondary | ICD-10-CM | POA: Diagnosis not present

## 2024-01-03 DIAGNOSIS — R188 Other ascites: Secondary | ICD-10-CM

## 2024-01-03 DIAGNOSIS — K3189 Other diseases of stomach and duodenum: Secondary | ICD-10-CM

## 2024-01-03 LAB — POC URINALSYSI DIPSTICK (AUTOMATED)
Bilirubin, UA: NEGATIVE
Blood, UA: NEGATIVE
Glucose, UA: NEGATIVE
Ketones, UA: NEGATIVE
Nitrite, UA: NEGATIVE
Protein, UA: POSITIVE — AB
Spec Grav, UA: 1.025 (ref 1.010–1.025)
Urobilinogen, UA: 1 U/dL
pH, UA: 6 (ref 5.0–8.0)

## 2024-01-03 LAB — POCT GLYCOSYLATED HEMOGLOBIN (HGB A1C): Hemoglobin A1C: 6.7 % — AB (ref 4.0–5.6)

## 2024-01-03 MED ORDER — CIPROFLOXACIN HCL 500 MG PO TABS: 500.0000 mg | ORAL_TABLET | Freq: Two times a day (BID) | ORAL | 0 refills | Status: DC

## 2024-01-03 MED ORDER — FLUOXETINE HCL 40 MG PO CAPS: 40.0000 mg | ORAL_CAPSULE | Freq: Two times a day (BID) | ORAL | 3 refills | Status: AC

## 2024-01-03 MED ORDER — ROPINIROLE HCL 1 MG PO TABS: 1.0000 mg | ORAL_TABLET | Freq: Every day | ORAL | 5 refills | Status: AC

## 2024-01-03 NOTE — Addendum Note (Signed)
 Addended by: LADONNA INOCENTE SAILOR on: 01/03/2024 01:54 PM   Modules accepted: Orders

## 2024-01-03 NOTE — Progress Notes (Signed)
   Subjective:    Patient ID: Laurie Sutton, female    DOB: [birth year], 43 y.o.   MRN: [id number]  HPI Here with her husband to follow up a hospital stay from 12-15-23 to 12-19-23 for hepatic encephalopathy. She has NASH cirrhosis, and she is followed by [facility] here in [place]. She is also seeing the [facility] transplant team in [place]. On admission her total bilirubin was 3.1, ammonia 73, WBC 2.9, Hgb 10.7, platelets 85, and creatinine 0.90. She was fluid overloaded and she was diuresed. Paracentesis was performed, and analysis on the peritoneal fluid was unremarkable. A CT showed cirrhosis, portal HTN, gastropathy, and esophageal varices. Her gall baldder was full of stones, but these are asymptomatic. She was started on Lactulose  and Spironolactone , and the ammonia was down to 44 at DC. She had extensive labs drawn yesterday at [facility], but I do not have these results. Today she feels fatigued, and she has frequent nausea. She complains of urinary pressure and burning for the past 2 days. No fever. She also describes having to move her legs constantly every night in bed, and this interferes with her sleep.    Review of Systems  Constitutional:  Positive for fatigue.  Respiratory: Negative.    Cardiovascular: Negative.   Gastrointestinal:  Positive for abdominal distention, diarrhea and nausea. Negative for abdominal pain, blood in stool, constipation and vomiting.  Genitourinary:  Positive for dysuria, frequency and urgency. Negative for flank pain and hematuria.       Objective:   Physical Exam Constitutional:      Appearance: Normal appearance.  Cardiovascular:     Rate and Rhythm: Normal rate and regular rhythm.     Pulses: Normal pulses.     Heart sounds: Normal heart sounds.  Pulmonary:     Effort: Pulmonary effort is normal.     Breath sounds: Normal breath sounds.  Abdominal:     General: Bowel sounds are normal. There is distension.     Tenderness:  There is no abdominal tenderness. There is no right CVA tenderness, left CVA tenderness, guarding or rebound.  Neurological:     Mental Status: She is alert.           Assessment & Plan:  She has NASH cirrhosis with hyper ammonemia. She will follow up with the [facility] and hopefully she can get on the transplant list sometime soon. She is taking Lactulose  to keep the ammonia levels down. She is getting a paracentesis once every week. She has a UTI today, so we will treat with 7 days of Cipro . Culture the urine sample. She is given a flu vaccine and a Prevnar 20 vaccine. She has restless legs, and we will treat this Ropinerole 2 mg at bedtime.  Her type 2 insulin  dependent diabetes is well controlled. We spent a total of (35   ) minutes reviewing records and discussing these issues.  Garnette Olmsted, MD

## 2024-01-04 ENCOUNTER — Ambulatory Visit: Admitting: Gastroenterology

## 2024-01-04 NOTE — Progress Notes (Deleted)
 [facility] Progress Note  Chief Complaint: ***  Subjective  Prior history  MASLD related cirrhosis with decompensation worsening over this calendar year. I last saw her for an upper endoscopy April 2025, and she was last in the office with our APP July 2025. History of C. difficile June 2025 precipitating exacerbation of hepatic encephalopathy and hospitalization Multiple hospitalizations in the last few months for escalation of ascites with abdominal pain and nausea requiring frequent paracentesis.  As of August 2025 hospitalization, requiring weekly paracentesis with albumin . Hepatic encephalopathy requiring lactulose  and rifaximin . Under the care of [facility] being evaluated for liver transplant.  Discussed the use of AI scribe software for clinical note transcription with the patient, who gave verbal consent to proceed.  History of Present Illness    I also recently completed FMLA forms for both her and her husband.  ROS: Cardiovascular:  no chest pain Respiratory: no dyspnea  The patient's Past Medical, Family and Social History were reviewed and are on file in the EMR. Past Medical History:  Diagnosis Date   ADHD    Allergy    Anemia    IRON TRANSFUSION 07-2020 NONE SINCE   Anxiety    Back pain    COVID 08/12/2019   ALL SYMPTOMS REOLVED PER PT   Depression    Diabetic neuropathy (HCC) 11/11/2020   FEET   dm type 2    sees Dr. Odella Jacobson at [facility]   Fatty liver    GERD (gastroesophageal reflux disease)    History of kidney stones    Joint pain    Menorrhagia 11/11/2020   Migraines    Murmur, cardiac    FAINT NO CARDIOLOGIST   Other fatigue    Pneumonia 08/12/2019   COVID PNEUMONIA ALL SYMPTOMS RESOLVED PER PT   Shortness of breath on exertion     Past Surgical History:  Procedure Laterality Date   CESAREAN SECTION  12/13/2006   COLONOSCOPY WITH PROPOFOL  N/A 08/02/2023   Procedure: COLONOSCOPY WITH PROPOFOL ;   Surgeon: Legrand Victory LITTIE DOUGLAS, MD;  Location: [facility];  Service: Gastroenterology;  Laterality: N/A;   ESOPHAGOGASTRODUODENOSCOPY (EGD) WITH PROPOFOL  N/A 08/02/2023   Procedure: ESOPHAGOGASTRODUODENOSCOPY (EGD) WITH PROPOFOL ;  Surgeon: Legrand Victory LITTIE DOUGLAS, MD;  Location: [facility];  Service: Gastroenterology;  Laterality: N/A;   EXTRACORPOREAL SHOCK WAVE LITHOTRIPSY  2018   FLEXIBLE SIGMOIDOSCOPY N/A 11/20/2023   Procedure: KINGSTON SIDE;  Surgeon: Albertus Gordy HERO, MD;  Location: [facility];  Service: Gastroenterology;  Laterality: N/A;   FOOT SURGERY Right 10/02/2020   RIGHT FOOT HEEL AND TOES, SURGICAL CENTER OFF [address]   IR PARACENTESIS  07/06/2023   IR PARACENTESIS  08/18/2023   IR PARACENTESIS  10/05/2023   IR PARACENTESIS  10/11/2023   IR PARACENTESIS  10/20/2023   IR PARACENTESIS  11/08/2023   IR PARACENTESIS  11/28/2023   IR PARACENTESIS  12/09/2023   IR PARACENTESIS  12/15/2023   IR PARACENTESIS  12/16/2023   IR PARACENTESIS  12/23/2023   IR PARACENTESIS  12/30/2023   IR TRANSCATHETER BX  07/26/2023   IR US  GUIDE VASC ACCESS RIGHT  07/26/2023   IR VENOGRAM HEPATIC W HEMODYNAMIC EVALUATION  07/26/2023   LAPAROSCOPIC VAGINAL HYSTERECTOMY WITH SALPINGECTOMY Bilateral 11/17/2020   Procedure: LAPAROSCOPIC ASSISTED VAGINAL HYSTERECTOMY WITH BILATERAL SALPINGECTOMY;  Surgeon: Dannielle Bouchard, DO;  Location: [facility];  Service: Gynecology;  Laterality: Bilateral;   POLYPECTOMY  08/02/2023   Procedure: POLYPECTOMY, INTESTINE;  Surgeon: Legrand,  Victory LITTIE MOULD, MD;  Location: [facility];  Service: Gastroenterology;;     Objective:  Med list reviewed  Current Outpatient Medications:    acetaminophen  (TYLENOL ) 325 MG tablet, Take 2 tablets (650 mg total) by mouth every 6 (six) hours as needed for mild pain (pain score 1-3) or fever (or Fever >/= 101)., Disp: 20 tablet, Rfl: 0   albuterol  (VENTOLIN  HFA) 108 (90 Base) MCG/ACT inhaler, INHALE 2 PUFFS INTO THE LUNGS UP TO EVERY 6  HOURS AS NEEDED FOR WHEEZING/SHORTNESS OF BREATH, Disp: 18 each, Rfl: 0   bisacodyl  (DULCOLAX) 5 MG EC tablet, Take 2 tablets (10 mg total) by mouth daily as needed for moderate constipation., Disp: 30 tablet, Rfl: 0   calcium  carbonate (TUMS - DOSED IN MG ELEMENTAL CALCIUM ) 500 MG chewable tablet, Chew 2 tablets by mouth as needed for indigestion or heartburn., Disp: , Rfl:    ciprofloxacin  (CIPRO ) 500 MG tablet, Take 1 tablet (500 mg total) by mouth 2 (two) times daily., Disp: 14 tablet, Rfl: 0   clotrimazole -betamethasone  (LOTRISONE ) cream, APPLY 1 APPLICATION TOPICALLY TWICE A DAY AS NEEDED, Disp: 30 g, Rfl: 1   Continuous Glucose Sensor (DEXCOM G7 SENSOR) MISC, Use 1 sensor for continuous glucose monitoring every 10 days for 30 days, Disp: 3 each, Rfl: 5   feeding supplement, GLUCERNA SHAKE, (GLUCERNA SHAKE) LIQD, Take 237 mLs by mouth 3 (three) times daily between meals., Disp: , Rfl:    FLUoxetine  (PROZAC ) 40 MG capsule, Take 1 capsule (40 mg total) by mouth 2 (two) times daily., Disp: 180 capsule, Rfl: 3   furosemide  (LASIX ) 80 MG tablet, Take 1 tablet (80 mg total) by mouth daily., Disp: 30 tablet, Rfl: 0   HUMULIN  R U-500 KWIKPEN 500 UNIT/ML KwikPen, Use 120 units at breakfast, 100 at lunch, and 100 at dinner (Patient taking differently: Use 60 units at breakfast, 65 at lunch, and 65 at dinner), Disp: 1 mL, Rfl: 0   hydrocortisone  2.5 % cream, Use 3 (three) times daily rectally (Patient taking differently: Apply 1 Application topically 3 (three) times daily as needed.), Disp: 28.35 g, Rfl: 0   lactulose  (CHRONULAC ) 10 GM/15ML solution, Take 30 mLs (20 g total) by mouth 4 (four) times daily. (Patient taking differently: Take 20 g by mouth 3 (three) times daily.), Disp: 3784 mL, Rfl: 2   LORazepam  (ATIVAN ) 0.5 MG tablet, Take 0.5 mg by mouth at bedtime., Disp: , Rfl:    midodrine  (PROAMATINE ) 10 MG tablet, Take 1 tablet (10 mg total) by mouth 2 (two) times daily with a meal., Disp: 60 tablet,  Rfl: 2   montelukast  (SINGULAIR ) 10 MG tablet, Take 1 tablet (10 mg total) by mouth at bedtime., Disp: 90 tablet, Rfl: 3   Multiple Vitamin (MULTIVITAMIN WITH MINERALS) TABS tablet, Take 1 tablet by mouth daily., Disp: 30 tablet, Rfl: 2   Nutritional Supplements (,FEEDING SUPPLEMENT, PROSOURCE PLUS) liquid, Take 30 mLs by mouth 3 (three) times daily between meals., Disp: , Rfl:    omeprazole  (PRILOSEC) 40 MG capsule, TAKE 1 CAPSULE (40 MG TOTAL) BY MOUTH IN THE MORNING, Disp: 90 capsule, Rfl: 0   ondansetron  (ZOFRAN ) 4 MG tablet, Take 1 tablet (4 mg total) by mouth every 8 (eight) hours as needed for nausea or vomiting., Disp: 45 tablet, Rfl: 3   prochlorperazine  (COMPAZINE ) 10 MG tablet, Take 1 tablet (10 mg total) by mouth every 8 (eight) hours as needed for nausea or vomiting., Disp: 30 tablet, Rfl: 2   rOPINIRole  (REQUIP ) 1 MG  tablet, Take 1 tablet (1 mg total) by mouth at bedtime., Disp: 30 tablet, Rfl: 5   spironolactone  (ALDACTONE ) 100 MG tablet, Take 3 tablets (300 mg total) by mouth daily. (Patient taking differently: Take 300 mg by mouth daily. Taking total (150 mg)), Disp: 90 tablet, Rfl: 0   Vitamin D , Ergocalciferol , (DRISDOL ) 1.25 MG (50000 UNIT) CAPS capsule, Take 50,000 Units by mouth every 7 (seven) days. No specific day, Disp: , Rfl:    XIFAXAN  550 MG TABS tablet, Take 550 mg by mouth in the morning. (Patient taking differently: Take 550 mg by mouth 2 (two) times daily.), Disp: , Rfl:    Vital signs in last 24 hrs: There were no vitals filed for this visit. Wt Readings from Last 3 Encounters:  01/03/24 180 lb (81.6 kg)  12/19/23 179 lb 10.8 oz (81.5 kg)  11/18/23 197 lb (89.4 kg)    Physical Exam  *** HEENT: sclera anicteric, oral mucosa moist without lesions Neck: supple, no thyromegaly, JVD or lymphadenopathy Cardiac: ***,  no peripheral edema Pulm: clear to auscultation bilaterally, normal RR and effort noted Abdomen: soft, *** tenderness, with active bowel sounds. No  guarding or palpable hepatosplenomegaly. Skin; warm and dry, no jaundice or rash   Labs:     Latest Ref Rng & Units 12/19/2023    4:26 AM 12/18/2023    2:23 AM 12/17/2023    2:31 AM  CMP  Glucose 70 - 99 mg/dL 790  732  729   BUN 6 - 20 mg/dL 5  <5  <5   Creatinine 0.44 - 1.00 mg/dL 9.19  9.19  9.14   Sodium 135 - 145 mmol/L 131  130  133   Potassium 3.5 - 5.1 mmol/L 3.4  3.7  3.5   Chloride 98 - 111 mmol/L 96  97  99   CO2 22 - 32 mmol/L 25  23  24    Calcium  8.9 - 10.3 mg/dL 8.7  8.5  8.5   Total Protein 6.5 - 8.1 g/dL   6.1   Total Bilirubin 0.0 - 1.2 mg/dL   3.0   Alkaline Phos 38 - 126 U/L   75   AST 15 - 41 U/L   36   ALT 0 - 44 U/L   21       Latest Ref Rng & Units 12/19/2023    4:26 AM 12/18/2023    2:23 AM 12/17/2023    2:31 AM  CBC  WBC 4.0 - 10.5 K/uL 2.6  2.5  1.9   Hemoglobin 12.0 - 15.0 g/dL 9.8  9.2  8.4   Hematocrit 36.0 - 46.0 % 30.2  28.1  26.3   Platelets 150 - 400 K/uL 75  67  62    Lab Results  Component Value Date   INR 1.7 (H) 12/16/2023   INR 2.0 (H) 11/22/2023   INR 1.8 (H) 11/21/2023   MELD 3.0: 22 at 12/18/2023  2:23 AM MELD-Na: 22 at 12/18/2023  2:23 AM Calculated from: Serum Creatinine: 0.8 mg/dL (Using min of 1 mg/dL) at 0/05/7972  7:76 AM Serum Sodium: 130 mmol/L at 12/18/2023  2:23 AM Total Bilirubin: 3 mg/dL at 0/06/7972  7:68 AM Serum Albumin : 2.9 g/dL at 0/06/7972  7:68 AM INR(ratio): 1.7 at 12/16/2023  2:47 AM Age at listing (hypothetical): 42 years Sex: Female at 12/18/2023  2:23 AM  CPT class C ___________________________________________ Radiologic studies: 43 year old female with history of NASH cirrhosis, with recurrent ascites. IR was requested for therapeutic paracentesis. 8  L max.   EXAM: ULTRASOUND GUIDED THERAPEUTIC PARACENTESIS   MEDICATIONS: 8 cc of 1% lidocaine    COMPLICATIONS: None immediate.   PROCEDURE: Informed written consent was obtained from the patient after a discussion of the risks, benefits and alternatives to  treatment. A timeout was performed prior to the initiation of the procedure.   Initial ultrasound scanning demonstrates a large amount of ascites within the right lower abdominal quadrant. The right lower abdomen was prepped and draped in the usual sterile fashion. 1% lidocaine  was used for local anesthesia.   Following this, a 19 gauge, 7-cm, Yueh catheter was introduced. An ultrasound image was saved for documentation purposes. The paracentesis was performed. The catheter was removed and a dressing was applied. The patient tolerated the procedure well without immediate post procedural complication. Patient received post-procedure intravenous albumin ; see nursing notes for details.   FINDINGS: A total of approximately 6.0 L of clear, straw-colored peritoneal fluid was removed.   IMPRESSION: Successful ultrasound-guided paracentesis yielding 6.0 liters of peritoneal fluid.   Procedure performed by Carlin Griffon, PA-C     Electronically Signed   By: Cordella Banner   On: 12/30/2023 13:01    ____________________________________________ Other:   _____________________________________________   No diagnosis found.  Assessment and Plan Assessment & Plan      Plan:   *** minutes were spent on this encounter (including chart review, history/exam, counseling/coordination of care, and documentation) > 50% of that time was spent on counseling and coordination of care.   Victory LITTIE Brand III

## 2024-01-06 ENCOUNTER — Emergency Department (HOSPITAL_COMMUNITY)

## 2024-01-06 ENCOUNTER — Emergency Department (HOSPITAL_COMMUNITY)
Admission: EM | Admit: 2024-01-06 | Discharge: 2024-01-06 | Disposition: A | Discharge status: Home / Self Care | Attending: Emergency Medicine | Admitting: Emergency Medicine

## 2024-01-06 ENCOUNTER — Ambulatory Visit (HOSPITAL_COMMUNITY)
Admission: RE | Admit: 2024-01-06 | Discharge: 2024-01-06 | Disposition: A | Discharge status: Home / Self Care | Source: Ambulatory Visit | Attending: Nurse Practitioner | Admitting: Nurse Practitioner

## 2024-01-06 ENCOUNTER — Encounter (HOSPITAL_COMMUNITY): Payer: Self-pay

## 2024-01-06 ENCOUNTER — Other Ambulatory Visit: Payer: Self-pay

## 2024-01-06 DIAGNOSIS — R1084 Generalized abdominal pain: Secondary | ICD-10-CM | POA: Insufficient documentation

## 2024-01-06 DIAGNOSIS — R188 Other ascites: Secondary | ICD-10-CM | POA: Diagnosis present

## 2024-01-06 DIAGNOSIS — Z91040 Latex allergy status: Secondary | ICD-10-CM | POA: Diagnosis not present

## 2024-01-06 DIAGNOSIS — R1032 Left lower quadrant pain: Secondary | ICD-10-CM | POA: Diagnosis present

## 2024-01-06 HISTORY — PX: IR PARACENTESIS: IMG2679

## 2024-01-06 LAB — CBC
HCT: 25.3 % — ABNORMAL LOW (ref 36.0–46.0)
Hemoglobin: 8.2 g/dL — ABNORMAL LOW (ref 12.0–15.0)
MCH: 29.3 pg (ref 26.0–34.0)
MCHC: 32.4 g/dL (ref 30.0–36.0)
MCV: 90.4 fL (ref 80.0–100.0)
Platelets: 59 K/uL — ABNORMAL LOW (ref 150–400)
RBC: 2.8 MIL/uL — ABNORMAL LOW (ref 3.87–5.11)
RDW: 16.9 % — ABNORMAL HIGH (ref 11.5–15.5)
WBC: 1.7 K/uL — ABNORMAL LOW (ref 4.0–10.5)
nRBC: 0 % (ref 0.0–0.2)

## 2024-01-06 LAB — GRAM STAIN

## 2024-01-06 LAB — ALBUMIN, PLEURAL OR PERITONEAL FLUID: Albumin, Fluid: <1.5 g/dL

## 2024-01-06 LAB — CBC WITH DIFFERENTIAL/PLATELET
Abs Immature Granulocytes: 0 K/uL (ref 0.00–0.07)
Basophils Absolute: 0 K/uL (ref 0.0–0.1)
Basophils Relative: 1 %
Eosinophils Absolute: 0 K/uL (ref 0.0–0.5)
Eosinophils Relative: 2 %
HCT: 25 % — ABNORMAL LOW (ref 36.0–46.0)
Hemoglobin: 8 g/dL — ABNORMAL LOW (ref 12.0–15.0)
Immature Granulocytes: 0 %
Lymphocytes Relative: 18 %
Lymphs Abs: 0.3 K/uL — ABNORMAL LOW (ref 0.7–4.0)
MCH: 28.9 pg (ref 26.0–34.0)
MCHC: 32 g/dL (ref 30.0–36.0)
MCV: 90.3 fL (ref 80.0–100.0)
Monocytes Absolute: 0.3 K/uL (ref 0.1–1.0)
Monocytes Relative: 15 %
Neutro Abs: 1.2 K/uL — ABNORMAL LOW (ref 1.7–7.7)
Neutrophils Relative %: 64 %
Platelets: 61 K/uL — ABNORMAL LOW (ref 150–400)
RBC: 2.77 MIL/uL — ABNORMAL LOW (ref 3.87–5.11)
RDW: 17.1 % — ABNORMAL HIGH (ref 11.5–15.5)
WBC: 1.8 K/uL — ABNORMAL LOW (ref 4.0–10.5)
nRBC: 0 % (ref 0.0–0.2)

## 2024-01-06 LAB — ACID FAST CULTURE WITH REFLEXED SENSITIVITIES (MYCOBACTERIA): Acid Fast Culture: NEGATIVE

## 2024-01-06 LAB — URINALYSIS, ROUTINE W REFLEX MICROSCOPIC
Bilirubin Urine: NEGATIVE
Glucose, UA: NEGATIVE mg/dL
Hgb urine dipstick: NEGATIVE
Ketones, ur: NEGATIVE mg/dL
Nitrite: NEGATIVE
Protein, ur: NEGATIVE mg/dL
Specific Gravity, Urine: 1.009 (ref 1.005–1.030)
pH: 6 (ref 5.0–8.0)

## 2024-01-06 LAB — COMPREHENSIVE METABOLIC PANEL WITH GFR
ALT: 19 U/L (ref 0–44)
AST: 32 U/L (ref 15–41)
Albumin: 3.7 g/dL (ref 3.5–5.0)
Alkaline Phosphatase: 95 U/L (ref 38–126)
Anion gap: 10 (ref 5–15)
BUN: 6 mg/dL (ref 6–20)
CO2: 20 mmol/L — ABNORMAL LOW (ref 22–32)
Calcium: 8.6 mg/dL — ABNORMAL LOW (ref 8.9–10.3)
Chloride: 98 mmol/L (ref 98–111)
Creatinine, Ser: 0.69 mg/dL (ref 0.44–1.00)
GFR, Estimated: >60 mL/min (ref >60)
Glucose, Bld: 251 mg/dL — ABNORMAL HIGH (ref 70–99)
Potassium: 3.2 mmol/L — ABNORMAL LOW (ref 3.5–5.1)
Sodium: 128 mmol/L — ABNORMAL LOW (ref 135–145)
Total Bilirubin: 2.7 mg/dL — ABNORMAL HIGH (ref 0.0–1.2)
Total Protein: 6.8 g/dL (ref 6.5–8.1)

## 2024-01-06 LAB — LIPASE, BLOOD: Lipase: 41 U/L (ref 11–51)

## 2024-01-06 LAB — I-STAT CG4 LACTIC ACID, ED
Lactic Acid, Venous: 2 mmol/L (ref 0.5–1.9)
Lactic Acid, Venous: 2.1 mmol/L (ref 0.5–1.9)

## 2024-01-06 LAB — GLUCOSE, PLEURAL OR PERITONEAL FLUID: Glucose, Fluid: 258 mg/dL

## 2024-01-06 LAB — PROTEIN, PLEURAL OR PERITONEAL FLUID: Total protein, fluid: <3 g/dL

## 2024-01-06 LAB — LACTATE DEHYDROGENASE, PLEURAL OR PERITONEAL FLUID: LD, Fluid: <25 U/L — ABNORMAL HIGH (ref 3–23)

## 2024-01-06 LAB — AMMONIA: Ammonia: 60 umol/L — ABNORMAL HIGH (ref 9–35)

## 2024-01-06 MED ORDER — MORPHINE SULFATE (PF) 2 MG/ML IV SOLN
2.0000 mg | Freq: Once | INTRAVENOUS | Status: AC
  Administered 2024-01-06: 2 mg via INTRAVENOUS
  Filled 2024-01-06: qty 1

## 2024-01-06 MED ORDER — IOHEXOL 350 MG/ML SOLN
75.0000 mL | Freq: Once | INTRAVENOUS | Status: AC | PRN
  Administered 2024-01-06: 75 mL via INTRAVENOUS

## 2024-01-06 MED ORDER — HYDROMORPHONE HCL 2 MG PO TABS: 1.0000 mg | ORAL_TABLET | Freq: Four times a day (QID) | ORAL | 0 refills | Status: DC | PRN

## 2024-01-06 MED ORDER — ONDANSETRON 4 MG PO TBDP: 4.0000 mg | ORAL_TABLET | Freq: Three times a day (TID) | ORAL | 0 refills | Status: DC | PRN

## 2024-01-06 MED ORDER — FENTANYL CITRATE PF 50 MCG/ML IJ SOSY
50.0000 ug | PREFILLED_SYRINGE | Freq: Once | INTRAMUSCULAR | Status: AC
  Administered 2024-01-06: 50 ug via INTRAVENOUS
  Filled 2024-01-06: qty 1

## 2024-01-06 MED ORDER — LIDOCAINE HCL 1 % IJ SOLN
INTRAMUSCULAR | Status: AC
  Filled 2024-01-06: qty 20

## 2024-01-06 MED ORDER — ALBUMIN HUMAN 25 % IV SOLN
INTRAVENOUS | Status: AC
  Filled 2024-01-06: qty 200

## 2024-01-06 MED ORDER — LIDOCAINE-EPINEPHRINE 1 %-1:100000 IJ SOLN
20.0000 mL | Freq: Once | INTRAMUSCULAR | Status: AC
Start: 2024-01-06 — End: 2024-01-06
  Administered 2024-01-06: 10 mL via INTRADERMAL

## 2024-01-06 MED ORDER — LIDOCAINE-EPINEPHRINE 1 %-1:100000 IJ SOLN
INTRAMUSCULAR | Status: AC
Start: 2024-01-06 — End: 2024-01-06
  Filled 2024-01-06: qty 1

## 2024-01-06 MED ORDER — MORPHINE SULFATE 15 MG PO TABS: 7.5000 mg | ORAL_TABLET | Freq: Four times a day (QID) | ORAL | 0 refills | Status: DC | PRN

## 2024-01-06 MED ORDER — ALBUMIN HUMAN 25 % IV SOLN
50.0000 g | Freq: Once | INTRAVENOUS | Status: AC
Start: 2024-01-06 — End: 2024-01-06
  Administered 2024-01-06: 50 g via INTRAVENOUS

## 2024-01-06 NOTE — ED Provider Notes (Signed)
 [place] EMERGENCY DEPARTMENT AT [facility] Provider Note   CSN: [id number] Arrival date & time: 01/06/24  1126     Patient presents with: Abdominal Pain   Laurie Sutton is a 43 y.o. female.   43 year old female with prior medical history as detailed below presents for evaluation.  Patient complains of left lower quadrant abdominal pain.  Patient reports the pain was present since early this morning.  She presented to the ED for previously scheduled paracentesis.  She had 8 L removed.  Albumin  was replaced.  She reports that her last paracentesis was last week.  She is scheduled for this weekly.  She was not feeling improved after the paracentesis so she came to the ED for evaluation.  Unfortunately, peritoneal fluid was not saved for analysis given that she did not report her abdominal pain to IR team.  The history is provided by the patient and medical records.       Prior to Admission medications   Medication Sig Start Date End Date Taking? Authorizing Provider  acetaminophen  (TYLENOL ) 325 MG tablet Take 2 tablets (650 mg total) by mouth every 6 (six) hours as needed for mild pain (pain score 1-3) or fever (or Fever >/= 101). 10/07/23   Sherrill Cable Latif, DO  albuterol  (VENTOLIN  HFA) 108 [phone] Base) MCG/ACT inhaler INHALE 2 PUFFS INTO THE LUNGS UP TO EVERY 6 HOURS AS NEEDED FOR WHEEZING/SHORTNESS OF BREATH 08/20/22   [name], MD  bisacodyl  (DULCOLAX) 5 MG EC tablet Take 2 tablets (10 mg total) by mouth daily as needed for moderate constipation. 12/19/23   Arlice Reichert, MD  calcium  carbonate (TUMS - DOSED IN MG ELEMENTAL CALCIUM ) 500 MG chewable tablet Chew 2 tablets by mouth as needed for indigestion or heartburn.    [provider]  ciprofloxacin  (CIPRO ) 500 MG tablet Take 1 tablet (500 mg total) by mouth 2 (two) times daily. 01/03/24   [name], MD  clotrimazole -betamethasone  (LOTRISONE ) cream APPLY 1 APPLICATION TOPICALLY TWICE A DAY AS NEEDED  05/26/23   [name], MD  Continuous Glucose Sensor (DEXCOM G7 SENSOR) MISC Use 1 sensor for continuous glucose monitoring every 10 days for 30 days 05/25/23   Braulio Hough, MD  feeding supplement, GLUCERNA SHAKE, (GLUCERNA SHAKE) LIQD Take 237 mLs by mouth 3 (three) times daily between meals. 12/19/23   Arlice Reichert, MD  FLUoxetine  (PROZAC ) 40 MG capsule Take 1 capsule (40 mg total) by mouth 2 (two) times daily. 01/03/24   [name], MD  furosemide  (LASIX ) 80 MG tablet Take 1 tablet (80 mg total) by mouth daily. 10/11/23   Sherrill Cable Latif, DO  HUMULIN  R U-500 KWIKPEN 500 UNIT/ML KwikPen Use 120 units at breakfast, 100 at lunch, and 100 at dinner Patient taking differently: Use 60 units at breakfast, 65 at lunch, and 65 at dinner 04/08/22   [name], MD  hydrocortisone  2.5 % cream Use 3 (three) times daily rectally Patient taking differently: Apply 1 Application topically 3 (three) times daily as needed. 11/22/23   Dennise Lavada POUR, MD  lactulose  (CHRONULAC ) 10 GM/15ML solution Take 30 mLs (20 g total) by mouth 4 (four) times daily. Patient taking differently: Take 20 g by mouth 3 (three) times daily. 12/19/23 03/18/24  Arlice Reichert, MD  LORazepam  (ATIVAN ) 0.5 MG tablet Take 0.5 mg by mouth at bedtime.    [provider]  midodrine  (PROAMATINE ) 10 MG tablet Take 1 tablet (10 mg total) by mouth 2 (two)  times daily with a meal. 12/19/23 03/18/24  Dahal, Chapman, MD  montelukast  (SINGULAIR ) 10 MG tablet Take 1 tablet (10 mg total) by mouth at bedtime. 08/25/22   [name], MD  Multiple Vitamin (MULTIVITAMIN WITH MINERALS) TABS tablet Take 1 tablet by mouth daily. 12/20/23 03/19/24  Arlice Chapman, MD  Nutritional Supplements (,FEEDING SUPPLEMENT, PROSOURCE PLUS) liquid Take 30 mLs by mouth 3 (three) times daily between meals. 12/19/23   Dahal, Chapman, MD  omeprazole  (PRILOSEC) 40 MG capsule TAKE 1 CAPSULE (40 MG TOTAL) BY MOUTH IN THE MORNING 10/23/23   Legrand Victory LITTIE DOUGLAS, MD   ondansetron  (ZOFRAN ) 4 MG tablet Take 1 tablet (4 mg total) by mouth every 8 (eight) hours as needed for nausea or vomiting. 12/27/23   Legrand Victory LITTIE DOUGLAS, MD  prochlorperazine  (COMPAZINE ) 10 MG tablet Take 1 tablet (10 mg total) by mouth every 8 (eight) hours as needed for nausea or vomiting. 12/29/23   Legrand Victory LITTIE DOUGLAS, MD  rOPINIRole  (REQUIP ) 1 MG tablet Take 1 tablet (1 mg total) by mouth at bedtime. 01/03/24   [name], MD  spironolactone  (ALDACTONE ) 100 MG tablet Take 3 tablets (300 mg total) by mouth daily. Patient taking differently: Take 300 mg by mouth daily. Taking total (150 mg) 11/01/23 04/11/24  Sira, Zackery, MD  Vitamin D , Ergocalciferol , (DRISDOL ) 1.25 MG (50000 UNIT) CAPS capsule Take 50,000 Units by mouth every 7 (seven) days. No specific day 11/19/23   [provider]  XIFAXAN  550 MG TABS tablet Take 550 mg by mouth in the morning. Patient taking differently: Take 550 mg by mouth 2 (two) times daily. 10/27/23   [provider]    Allergies: Vancomycin , Amoxicillin, Azithromycin , Dulaglutide , Empagliflozin -metformin  hcl er, Januvia  [sitagliptin ], Latex, Metformin , Penicillins, and Vitamin k  and related    Review of Systems  All other systems reviewed and are negative.   Updated Vital Signs BP (!) 100/52 (BP Location: Left Arm)   Pulse 98   Temp 98.9 F (37.2 C)   Resp 20   Ht 5' 6 (1.676 m)   Wt 86.2 kg   LMP 11/13/2020 (Exact Date)   SpO2 99%   BMI 30.67 kg/m   Physical Exam Vitals and nursing note reviewed.  Constitutional:      General: She is not in acute distress.    Appearance: Normal appearance. She is well-developed.  HENT:     Head: Normocephalic and atraumatic.  Eyes:     General: Scleral icterus present.     Conjunctiva/sclera: Conjunctivae normal.     Pupils: Pupils are equal, round, and reactive to light.  Cardiovascular:     Rate and Rhythm: Normal rate and regular rhythm.     Heart sounds: Normal heart sounds.   Pulmonary:     Effort: Pulmonary effort is normal. No respiratory distress.     Breath sounds: Normal breath sounds.  Abdominal:     General: There is no distension.     Palpations: Abdomen is soft.     Tenderness: There is no abdominal tenderness.     Comments: Ascites present  Musculoskeletal:        General: No deformity. Normal range of motion.     Cervical back: Normal range of motion and neck supple.  Skin:    General: Skin is warm and dry.  Neurological:     General: No focal deficit present.     Mental Status: She is alert and oriented to person, place, and time.     (  all labs ordered are listed, but only abnormal results are displayed) Labs Reviewed  COMPREHENSIVE METABOLIC PANEL WITH GFR - Abnormal; Notable for the following components:      Result Value   Sodium 128 (*)    Potassium 3.2 (*)    CO2 20 (*)    Glucose, Bld 251 (*)    Calcium  8.6 (*)    Total Bilirubin 2.7 (*)    All other components within normal limits  CULTURE, BLOOD (ROUTINE X 2)  CULTURE, BLOOD (ROUTINE X 2)  LIPASE, BLOOD  URINALYSIS, ROUTINE W REFLEX MICROSCOPIC  AMMONIA  CBC  I-STAT CG4 LACTIC ACID, ED    EKG: None  Radiology: No results found.   Procedures   Medications Ordered in the ED - No data to display                                  Medical Decision Making Presents with left-sided abdominal pain.  Patient with history of cirrhosis and ascites.  Pain was present prior to her large-volume paracentesis performed earlier today.  However, fluid was not sent for analysis.  Repeat diagnostic paracentesis performed by IR.  Fluid sent for analysis.  Initial CT imaging is reassuringly without acute abnormality.  Initial labs obtained.  Most results obtained at time of signout and they appear reassuring as well.  Oncoming ED provider made aware of case and pending labs and need for reevaluation.    Amount and/or Complexity of Data Reviewed Labs: ordered. Radiology:  ordered.  Risk Prescription drug management.        Final diagnoses:  Generalized abdominal pain    ED Discharge Orders          Ordered    morphine  (MSIR) 15 MG tablet  Every 6 hours PRN        01/06/24 2047    ondansetron  (ZOFRAN -ODT) 4 MG disintegrating tablet  Every 8 hours PRN        01/06/24 2047    HYDROmorphone  (DILAUDID ) 2 MG tablet  Every 6 hours PRN        01/06/24 2112               Laurice Maude BROCKS, MD 01/07/24 [phone]

## 2024-01-06 NOTE — ED Notes (Signed)
 Patient transported to IR

## 2024-01-06 NOTE — ED Provider Triage Note (Signed)
 Emergency Medicine Provider Triage Evaluation Note  Laurie Sutton , a 43 y.o. female  was evaluated in triage.  Pt complains of left lower abdominal pain, woke from sleep at 3am, radiates to back, somewhat to right side. Came to the hospital for scheduled paracentesis, pulled 8L, replaced albumin . Last paracentesis was Friday last week (scheduled weekly).  Not feeling better after paracentesis and brought over to the ER for eval.  Had pna and flu vaccines Tuesday or Wednesday this week, temp 101 afterwards (just the night of the vaccine and not since), not feeling great since that time. Fatigued, sleeping a lot.  Diagnosed with UTI same day as vaccines, still with urgency, no dysuria, still taking abx (cipro ).   Review of Systems  Positive: Abdominal pain, nausea Negative: vomiting  Physical Exam  BP (!) 100/52 (BP Location: Left Arm)   Pulse 98   Temp 98.9 F (37.2 C)   Resp 20   Ht 5' 6 (1.676 m)   Wt 86.2 kg   LMP 11/13/2020 (Exact Date)   SpO2 99%   BMI 30.67 kg/m  Gen:   Awake, no distress   Resp:  Normal effort  MSK:   Moves extremities without difficulty  Other:    Medical Decision Making  Medically screening exam initiated at 12:09 PM.  Appropriate orders placed.  SCOTTY PINDER was informed that the remainder of the evaluation will be completed by another provider, this initial triage assessment does not replace that evaluation, and the importance of remaining in the ED until their evaluation is complete.     Beverley Leita LABOR, PA-C 01/06/24 1215

## 2024-01-06 NOTE — Procedures (Signed)
 PROCEDURE SUMMARY:  Patient presented to ED after paracentesis this AM. EDP now requesting a diagnostic paracentesis. Successful image-guided paracentesis from the left abdomen.  Yielded 1.0 liters of clear, straw-colored peritoneal fluid.  No immediate complications.  EBL: zero Patient tolerated well.   Specimen was sent for labs.  Please see imaging section of Epic for full dictation.  Carlin LABOR Ruchi Stoney PA-C 01/06/2024 3:04 PM

## 2024-01-06 NOTE — ED Triage Notes (Signed)
 Pt c/o abdominal pain that started at approximately 0300 today, had paracentesis before coming to ED, pain has continued after. Pt has had nausea, denies vomiting. Pt denies shortness of breath.

## 2024-01-06 NOTE — Procedures (Signed)
 PROCEDURE SUMMARY:  Successful image-guided paracentesis from the left abdomen.  Yielded 8.0 liters of clear, straw-colored peritoneal fluid.  No immediate complications.  EBL: zero Patient tolerated well.   Please see imaging section of Epic for full dictation.  Siniyah Evangelist A Reynol Arnone PA-C 01/06/2024 10:02 AM

## 2024-01-06 NOTE — ED Provider Notes (Signed)
 Care of the patient assumed at signout.  On signout patient had a second paracentesis, with fluid sent for evaluation, initial results reassuring.  However, the patient continued to complain of pain.  8:44 PM Patient remains hemodynamically the same, pain had improved with IV analgesics, await length conversation about today's evaluation, labs, workup, and in the context of her ongoing evaluation for placement on the liver transplant list. Patient awake and alert, appropriately interactive.  We discussed admission versus follow-up with her transplant coordinator on Monday.  Patient will transition to oral analgesics, follow-up with transplant team Monday.   Garrick Charleston, MD 01/06/24 2045

## 2024-01-06 NOTE — Discharge Instructions (Addendum)
 With your ongoing pain you are starting a new analgesic/pain medication regimen please discuss this with your transplant team.  Return here for concerning changes in your condition.

## 2024-01-09 ENCOUNTER — Encounter (HOSPITAL_COMMUNITY): Payer: Self-pay

## 2024-01-09 ENCOUNTER — Other Ambulatory Visit: Payer: Self-pay

## 2024-01-09 ENCOUNTER — Emergency Department (HOSPITAL_COMMUNITY)

## 2024-01-09 ENCOUNTER — Observation Stay (HOSPITAL_COMMUNITY)
Admission: EM | Admit: 2024-01-09 | Discharge: 2024-01-11 | Disposition: A | Discharge status: Home / Self Care | Attending: Internal Medicine | Admitting: Internal Medicine

## 2024-01-09 DIAGNOSIS — D61818 Other pancytopenia: Secondary | ICD-10-CM | POA: Diagnosis not present

## 2024-01-09 DIAGNOSIS — Z91040 Latex allergy status: Secondary | ICD-10-CM | POA: Diagnosis not present

## 2024-01-09 DIAGNOSIS — K219 Gastro-esophageal reflux disease without esophagitis: Secondary | ICD-10-CM | POA: Diagnosis not present

## 2024-01-09 DIAGNOSIS — I4581 Long QT syndrome: Secondary | ICD-10-CM | POA: Diagnosis not present

## 2024-01-09 DIAGNOSIS — Z794 Long term (current) use of insulin: Secondary | ICD-10-CM | POA: Diagnosis not present

## 2024-01-09 DIAGNOSIS — E1140 Type 2 diabetes mellitus with diabetic neuropathy, unspecified: Secondary | ICD-10-CM | POA: Diagnosis not present

## 2024-01-09 DIAGNOSIS — E119 Type 2 diabetes mellitus without complications: Secondary | ICD-10-CM

## 2024-01-09 DIAGNOSIS — K7460 Unspecified cirrhosis of liver: Secondary | ICD-10-CM | POA: Diagnosis present

## 2024-01-09 DIAGNOSIS — K7469 Other cirrhosis of liver: Secondary | ICD-10-CM | POA: Diagnosis not present

## 2024-01-09 DIAGNOSIS — R0600 Dyspnea, unspecified: Secondary | ICD-10-CM

## 2024-01-09 DIAGNOSIS — E669 Obesity, unspecified: Secondary | ICD-10-CM | POA: Diagnosis not present

## 2024-01-09 DIAGNOSIS — Z6830 Body mass index (BMI) 30.0-30.9, adult: Secondary | ICD-10-CM | POA: Diagnosis not present

## 2024-01-09 DIAGNOSIS — K7581 Nonalcoholic steatohepatitis (NASH): Secondary | ICD-10-CM | POA: Insufficient documentation

## 2024-01-09 DIAGNOSIS — R9431 Abnormal electrocardiogram [ECG] [EKG]: Secondary | ICD-10-CM

## 2024-01-09 DIAGNOSIS — R188 Other ascites: Principal | ICD-10-CM | POA: Diagnosis present

## 2024-01-09 DIAGNOSIS — K7290 Hepatic failure, unspecified without coma: Secondary | ICD-10-CM | POA: Diagnosis present

## 2024-01-09 LAB — COMPREHENSIVE METABOLIC PANEL WITH GFR
ALT: 21 U/L (ref 0–44)
AST: 34 U/L (ref 15–41)
Albumin: 2.9 g/dL — ABNORMAL LOW (ref 3.5–5.0)
Alkaline Phosphatase: 98 U/L (ref 38–126)
Anion gap: 9 (ref 5–15)
BUN: 5 mg/dL — ABNORMAL LOW (ref 6–20)
CO2: 21 mmol/L — ABNORMAL LOW (ref 22–32)
Calcium: 8.4 mg/dL — ABNORMAL LOW (ref 8.9–10.3)
Chloride: 97 mmol/L — ABNORMAL LOW (ref 98–111)
Creatinine, Ser: 0.76 mg/dL (ref 0.44–1.00)
GFR, Estimated: >60 mL/min (ref >60)
Glucose, Bld: 399 mg/dL — ABNORMAL HIGH (ref 70–99)
Potassium: 3.2 mmol/L — ABNORMAL LOW (ref 3.5–5.1)
Sodium: 127 mmol/L — ABNORMAL LOW (ref 135–145)
Total Bilirubin: 2.6 mg/dL — ABNORMAL HIGH (ref 0.0–1.2)
Total Protein: 6.3 g/dL — ABNORMAL LOW (ref 6.5–8.1)

## 2024-01-09 LAB — AMMONIA: Ammonia: 36 umol/L — ABNORMAL HIGH (ref 9–35)

## 2024-01-09 LAB — CBC WITH DIFFERENTIAL/PLATELET
Abs Immature Granulocytes: 0.01 K/uL (ref 0.00–0.07)
Basophils Absolute: 0 K/uL (ref 0.0–0.1)
Basophils Relative: 1 %
Eosinophils Absolute: 0.1 K/uL (ref 0.0–0.5)
Eosinophils Relative: 3 %
HCT: 28.7 % — ABNORMAL LOW (ref 36.0–46.0)
Hemoglobin: 9.2 g/dL — ABNORMAL LOW (ref 12.0–15.0)
Immature Granulocytes: 0 %
Lymphocytes Relative: 20 %
Lymphs Abs: 0.5 K/uL — ABNORMAL LOW (ref 0.7–4.0)
MCH: 29.6 pg (ref 26.0–34.0)
MCHC: 32.1 g/dL (ref 30.0–36.0)
MCV: 92.3 fL (ref 80.0–100.0)
Monocytes Absolute: 0.3 K/uL (ref 0.1–1.0)
Monocytes Relative: 13 %
Neutro Abs: 1.6 K/uL — ABNORMAL LOW (ref 1.7–7.7)
Neutrophils Relative %: 63 %
Platelets: 63 K/uL — ABNORMAL LOW (ref 150–400)
RBC: 3.11 MIL/uL — ABNORMAL LOW (ref 3.87–5.11)
RDW: 17 % — ABNORMAL HIGH (ref 11.5–15.5)
Smear Review: NORMAL
WBC: 2.6 K/uL — ABNORMAL LOW (ref 4.0–10.5)
nRBC: 0 % (ref 0.0–0.2)

## 2024-01-09 LAB — PROTIME-INR
INR: 1.9 — ABNORMAL HIGH (ref 0.8–1.2)
Prothrombin Time: 22.9 s — ABNORMAL HIGH (ref 11.4–15.2)

## 2024-01-09 LAB — MAGNESIUM: Magnesium: 1.7 mg/dL (ref 1.7–2.4)

## 2024-01-09 MED ORDER — LIDOCAINE HCL 2 % IJ SOLN
10.0000 mL | Freq: Once | INTRAMUSCULAR | Status: DC
  Filled 2024-01-09: qty 20

## 2024-01-09 MED ORDER — MORPHINE SULFATE (PF) 4 MG/ML IV SOLN
4.0000 mg | Freq: Once | INTRAVENOUS | Status: DC
Start: 2024-01-09 — End: 2024-01-10
  Filled 2024-01-09: qty 1

## 2024-01-09 MED ORDER — DIPHENHYDRAMINE HCL 50 MG/ML IJ SOLN
25.0000 mg | Freq: Once | INTRAMUSCULAR | Status: AC
  Administered 2024-01-09: 25 mg via INTRAVENOUS
  Filled 2024-01-09: qty 1

## 2024-01-09 NOTE — ED Triage Notes (Signed)
 Pt arrived from home via POV c/o sob. Pt recently had paracentesis. Per family pt has been confused today. They are worried about increased acites and ammonia levels

## 2024-01-09 NOTE — ED Triage Notes (Signed)
 Pt presents with SOB, confusion, and abd distention, pt reports she has liver failure and is awaiting approval on a transplant list, pt had paracentesis x 3 days ago with 9L removed, concerned for increased ammonia level and need for another paracentesis

## 2024-01-09 NOTE — ED Provider Notes (Signed)
 [place] EMERGENCY DEPARTMENT AT [facility] Provider Note   CSN: [id number] Arrival date & time: 01/09/24  1953     Patient presents with: Shortness of Breath   Laurie Sutton is a 43 y.o. female.   Shortness of Breath Patient is a 43 year old female with PMH of [name] following with [facility] presenting to the ED via POV accompanied by husband with chief complaint of worsening dyspnea x2 days in the setting of abdominal distention.  Husband at bedside reports intermittent confusion largely unchanged from baseline.  Patient denies any fevers, chills, nausea, vomiting, changes in urination, or changes in bowel movements.  Of note, patient recently 09/26 with 8 L volume removed via paracentesis with subsequent 1 L removed for total of 9 L.  Prior to Admission medications   Medication Sig Start Date End Date Taking? Authorizing Provider  acetaminophen  (TYLENOL ) 325 MG tablet Take 2 tablets (650 mg total) by mouth every 6 (six) hours as needed for mild pain (pain score 1-3) or fever (or Fever >/= 101). 10/07/23   Sherrill Cable Latif, DO  albuterol  (VENTOLIN  HFA) [phone] Base) MCG/ACT inhaler INHALE 2 PUFFS INTO THE LUNGS UP TO EVERY 6 HOURS AS NEEDED FOR WHEEZING/SHORTNESS OF BREATH 08/20/22   [name], MD  bisacodyl  (DULCOLAX) 5 MG EC tablet Take 2 tablets (10 mg total) by mouth daily as needed for moderate constipation. 12/19/23   Arlice Reichert, MD  calcium  carbonate (TUMS - DOSED IN MG ELEMENTAL CALCIUM ) 500 MG chewable tablet Chew 2 tablets by mouth as needed for indigestion or heartburn.    [provider]  ciprofloxacin  (CIPRO ) 500 MG tablet Take 1 tablet (500 mg total) by mouth 2 (two) times daily. 01/03/24   [name], MD  clotrimazole -betamethasone  (LOTRISONE ) cream APPLY 1 APPLICATION TOPICALLY TWICE A DAY AS NEEDED 05/26/23   [name], MD  Continuous Glucose Sensor (DEXCOM G7 SENSOR) MISC Use 1 sensor for continuous glucose  monitoring every 10 days for 30 days 05/25/23   Braulio Hough, MD  feeding supplement, GLUCERNA SHAKE, (GLUCERNA SHAKE) LIQD Take 237 mLs by mouth 3 (three) times daily between meals. 12/19/23   Arlice Reichert, MD  FLUoxetine  (PROZAC ) 40 MG capsule Take 1 capsule (40 mg total) by mouth 2 (two) times daily. 01/03/24   [name], MD  furosemide  (LASIX ) 80 MG tablet Take 1 tablet (80 mg total) by mouth daily. 10/11/23   Sherrill Cable Latif, DO  HUMULIN  R U-500 KWIKPEN 500 UNIT/ML KwikPen Use 120 units at breakfast, 100 at lunch, and 100 at dinner Patient taking differently: Use 60 units at breakfast, 65 at lunch, and 65 at dinner 04/08/22   [name], MD  hydrocortisone  2.5 % cream Use 3 (three) times daily rectally Patient taking differently: Apply 1 Application topically 3 (three) times daily as needed. 11/22/23   Singh, Prashant K, MD  HYDROmorphone  (DILAUDID ) 2 MG tablet Take 0.5 tablets (1 mg total) by mouth every 6 (six) hours as needed for severe pain (pain score 7-10). 01/06/24   Garrick Charleston, MD  lactulose  (CHRONULAC ) 10 GM/15ML solution Take 30 mLs (20 g total) by mouth 4 (four) times daily. Patient taking differently: Take 20 g by mouth 3 (three) times daily. 12/19/23 03/18/24  Arlice Reichert, MD  LORazepam  (ATIVAN ) 0.5 MG tablet Take 0.5 mg by mouth at bedtime.    [provider]  midodrine  (PROAMATINE ) 10 MG tablet Take 1 tablet (10 mg total) by mouth 2 (  two) times daily with a meal. 12/19/23 03/18/24  Dahal, Chapman, MD  montelukast  (SINGULAIR ) 10 MG tablet Take 1 tablet (10 mg total) by mouth at bedtime. 08/25/22   [name], MD  morphine  (MSIR) 15 MG tablet Take 0.5 tablets (7.5 mg total) by mouth every 6 (six) hours as needed for severe pain (pain score 7-10). 01/06/24   Garrick Charleston, MD  Multiple Vitamin (MULTIVITAMIN WITH MINERALS) TABS tablet Take 1 tablet by mouth daily. 12/20/23 03/19/24  Arlice Chapman, MD  Nutritional Supplements (,FEEDING SUPPLEMENT, PROSOURCE PLUS)  liquid Take 30 mLs by mouth 3 (three) times daily between meals. 12/19/23   Dahal, Chapman, MD  omeprazole  (PRILOSEC) 40 MG capsule TAKE 1 CAPSULE (40 MG TOTAL) BY MOUTH IN THE MORNING 10/23/23   Legrand Victory LITTIE DOUGLAS, MD  ondansetron  (ZOFRAN ) 4 MG tablet Take 1 tablet (4 mg total) by mouth every 8 (eight) hours as needed for nausea or vomiting. 12/27/23   Legrand Victory LITTIE DOUGLAS, MD  ondansetron  (ZOFRAN -ODT) 4 MG disintegrating tablet Take 1 tablet (4 mg total) by mouth every 8 (eight) hours as needed for nausea or vomiting. 01/06/24   Garrick Charleston, MD  prochlorperazine  (COMPAZINE ) 10 MG tablet Take 1 tablet (10 mg total) by mouth every 8 (eight) hours as needed for nausea or vomiting. 12/29/23   Legrand Victory LITTIE DOUGLAS, MD  rOPINIRole  (REQUIP ) 1 MG tablet Take 1 tablet (1 mg total) by mouth at bedtime. 01/03/24   [name], MD  spironolactone  (ALDACTONE ) 100 MG tablet Take 3 tablets (300 mg total) by mouth daily. Patient taking differently: Take 300 mg by mouth daily. Taking total (150 mg) 11/01/23 04/11/24  Sira, Zackery, MD  Vitamin D , Ergocalciferol , (DRISDOL ) 1.25 MG (50000 UNIT) CAPS capsule Take 50,000 Units by mouth every 7 (seven) days. No specific day 11/19/23   [provider]  XIFAXAN  550 MG TABS tablet Take 550 mg by mouth in the morning. Patient taking differently: Take 550 mg by mouth 2 (two) times daily. 10/27/23   [provider]    Allergies: Vancomycin , Amoxicillin, Azithromycin , Dulaglutide , Empagliflozin -metformin  hcl er, Januvia  [sitagliptin ], Latex, Metformin , Penicillins, and Vitamin k  and related    Review of Systems  Respiratory:  Positive for shortness of breath.     Updated Vital Signs BP (!) 123/55   Pulse (!) 113   Temp 97.8 F (36.6 C)   Resp (!) 28   Ht 5' 6 (1.676 m)   Wt 86.2 kg   LMP 11/13/2020 (Exact Date)   SpO2 100%   BMI 30.67 kg/m   Physical Exam Constitutional:      Appearance: She is well-developed.  HENT:     Head: Normocephalic and  atraumatic.     Mouth/Throat:     Mouth: Mucous membranes are moist.     Pharynx: Oropharynx is clear.  Eyes:     Pupils: Pupils are equal, round, and reactive to light.  Cardiovascular:     Rate and Rhythm: Regular rhythm. Tachycardia present.  Pulmonary:     Effort: Pulmonary effort is normal.     Breath sounds: Normal breath sounds.  Abdominal:     General: Abdomen is protuberant.     Palpations: There is hepatomegaly.     Tenderness: There is no abdominal tenderness.  Musculoskeletal:     Cervical back: Normal range of motion.  Skin:    Capillary Refill: Capillary refill takes less than 2 seconds.  Neurological:     General: No focal deficit present.  Mental Status: She is alert and oriented to person, place, and time.     (all labs ordered are listed, but only abnormal results are displayed) Labs Reviewed  CBC WITH DIFFERENTIAL/PLATELET - Abnormal; Notable for the following components:      Result Value   WBC 2.6 (*)    RBC 3.11 (*)    Hemoglobin 9.2 (*)    HCT 28.7 (*)    RDW 17.0 (*)    Platelets 63 (*)    Neutro Abs 1.6 (*)    Lymphs Abs 0.5 (*)    All other components within normal limits  COMPREHENSIVE METABOLIC PANEL WITH GFR - Abnormal; Notable for the following components:   Sodium 127 (*)    Potassium 3.2 (*)    Chloride 97 (*)    CO2 21 (*)    Glucose, Bld 399 (*)    BUN 5 (*)    Calcium  8.4 (*)    Total Protein 6.3 (*)    Albumin  2.9 (*)    Total Bilirubin 2.6 (*)    All other components within normal limits  AMMONIA - Abnormal; Notable for the following components:   Ammonia 36 (*)    All other components within normal limits  PROTIME-INR - Abnormal; Notable for the following components:   Prothrombin Time 22.9 (*)    INR 1.9 (*)    All other components within normal limits  MAGNESIUM     EKG: None  Radiology: DG Chest Portable 1 View Result Date: 01/09/2024 CLINICAL DATA:  Shortness of breath. EXAM: PORTABLE CHEST 1 VIEW COMPARISON:   Chest radiograph dated 12/15/2023 FINDINGS: No focal consolidation, pleural effusion or pneumothorax. The cardiac silhouette is within normal limits. No acute osseous pathology. IMPRESSION: No active disease. Electronically Signed   By: Vanetta Chou M.D.   On: 01/09/2024 21:14     Procedures   Medications Ordered in the ED  morphine  (PF) 4 MG/ML injection 4 mg (4 mg Intravenous Not Given 01/09/24 2216)  lidocaine  (XYLOCAINE ) 2 % (with pres) injection 200 mg (has no administration in time range)  diphenhydrAMINE  (BENADRYL ) injection 25 mg (has no administration in time range)  diphenhydrAMINE  (BENADRYL ) injection 25 mg (25 mg Intravenous Given 01/09/24 2242)    Clinical Course as of 01/09/24 2324  Mon Jan 09, 2024  2113 Friday paracentesis x2. Noninfectious. Chills. Intermittent confusion. No fevers. Dry cough.  [WB]  2120 [facility] transplant team - 10/15,10/16.  [WB]  2306 Stable HO DHY Recurrent respiratory distress 2/2 ascities [CC]    Clinical Course User Index [CC] Jerral Meth, MD [WB] Dorcus Fallow, MD                                 Medical Decision Making Amount and/or Complexity of Data Reviewed Labs: ordered. Radiology: ordered.  Risk Prescription drug management.  43 year old female presenting to the ED with worsening respiratory insufficiency in the setting of expanding ascitic abdomen with recent large-volume paracentesis of 9 L in total 09/26, presenting with worsening abdominal distention since that time.  No new infectious symptoms.  Husband at bedside corroborates intermittent confusion, unchanged from prior.  Of note, patient with upcoming [facility] transplant team appointment 10/15, 10/16.   On arrival, patient hemodynamically stable.  Normotensive.  Afebrile.  No tachycardia.  Mild tachypnea 21 breaths/min, saturating 100% on RA.  Abdomen protuberant with distention -no focal tenderness.  No rebound or guarding.  Patient afebrile and  well-appearing.  Doubt SBP at this time.  CXR without evidence of focal consolidation, pleural effusion, or pneumothorax.  No acute primary respiratory process evident at this time.  Favored etiology for dyspnea is reduced diaphragmatic excursion in the setting of large volume ascites.  However, no emergent indication for ED paracentesis at this time.  Given rapid volume reexpansion of abdomen following recent paracentesis from 09/26, believe the patient will require more frequent drainage with need for more definitive access.  Gastroenterology team engaged, to follow with patient in the AM.  Ammonia notably 36.  Patient A&O x 4 at this time.  CMP with potassium 3.2, magnesium  level ordered.  INR 1.9, unchanged from baseline.  At this time, favor that patient will need admission to patient team for ongoing care coordination in the setting of rapid reexpansion ascites.  Plan of care discussed with oncoming ED team.   Final diagnoses:  Other ascites    ED Discharge Orders     None          Ilhan Madan, Elsie, MD 01/09/24 2324    Patt Alm Macho, MD 01/10/24 2052

## 2024-01-10 ENCOUNTER — Observation Stay (HOSPITAL_COMMUNITY)

## 2024-01-10 DIAGNOSIS — K7290 Hepatic failure, unspecified without coma: Secondary | ICD-10-CM

## 2024-01-10 DIAGNOSIS — K7581 Nonalcoholic steatohepatitis (NASH): Secondary | ICD-10-CM | POA: Diagnosis not present

## 2024-01-10 DIAGNOSIS — K7682 Hepatic encephalopathy: Secondary | ICD-10-CM | POA: Diagnosis not present

## 2024-01-10 DIAGNOSIS — K7460 Unspecified cirrhosis of liver: Secondary | ICD-10-CM | POA: Diagnosis not present

## 2024-01-10 DIAGNOSIS — R9431 Abnormal electrocardiogram [ECG] [EKG]: Secondary | ICD-10-CM

## 2024-01-10 DIAGNOSIS — R188 Other ascites: Secondary | ICD-10-CM

## 2024-01-10 DIAGNOSIS — R0600 Dyspnea, unspecified: Secondary | ICD-10-CM

## 2024-01-10 HISTORY — PX: IR PARACENTESIS: IMG2679

## 2024-01-10 LAB — COMPREHENSIVE METABOLIC PANEL WITH GFR
ALT: 22 U/L (ref 0–44)
AST: 38 U/L (ref 15–41)
Albumin: 3.2 g/dL — ABNORMAL LOW (ref 3.5–5.0)
Alkaline Phosphatase: 81 U/L (ref 38–126)
Anion gap: 12 (ref 5–15)
BUN: 6 mg/dL (ref 6–20)
CO2: 23 mmol/L (ref 22–32)
Calcium: 8.6 mg/dL — ABNORMAL LOW (ref 8.9–10.3)
Chloride: 96 mmol/L — ABNORMAL LOW (ref 98–111)
Creatinine, Ser: 0.73 mg/dL (ref 0.44–1.00)
GFR, Estimated: >60 mL/min (ref >60)
Glucose, Bld: 192 mg/dL — ABNORMAL HIGH (ref 70–99)
Potassium: 3.1 mmol/L — ABNORMAL LOW (ref 3.5–5.1)
Sodium: 131 mmol/L — ABNORMAL LOW (ref 135–145)
Total Bilirubin: 3.9 mg/dL — ABNORMAL HIGH (ref 0.0–1.2)
Total Protein: 6.3 g/dL — ABNORMAL LOW (ref 6.5–8.1)

## 2024-01-10 LAB — CBG MONITORING, ED
Glucose-Capillary: 180 mg/dL — ABNORMAL HIGH (ref 70–99)
Glucose-Capillary: 281 mg/dL — ABNORMAL HIGH (ref 70–99)
Glucose-Capillary: 199 mg/dL — ABNORMAL HIGH (ref 70–99)

## 2024-01-10 LAB — BODY FLUID CELL COUNT WITH DIFFERENTIAL
Eos, Fluid: 0 %
Lymphs, Fluid: 34 %
Monocyte-Macrophage-Serous Fluid: 62 % (ref 50–90)
Neutrophil Count, Fluid: 4 % (ref 0–25)
Total Nucleated Cell Count, Fluid: 115 uL (ref 0–1000)

## 2024-01-10 LAB — CBC
HCT: 26.4 % — ABNORMAL LOW (ref 36.0–46.0)
Hemoglobin: 8.8 g/dL — ABNORMAL LOW (ref 12.0–15.0)
MCH: 29.6 pg (ref 26.0–34.0)
MCHC: 33.3 g/dL (ref 30.0–36.0)
MCV: 88.9 fL (ref 80.0–100.0)
Platelets: 62 K/uL — ABNORMAL LOW (ref 150–400)
RBC: 2.97 MIL/uL — ABNORMAL LOW (ref 3.87–5.11)
RDW: 17.1 % — ABNORMAL HIGH (ref 11.5–15.5)
WBC: 2.2 K/uL — ABNORMAL LOW (ref 4.0–10.5)
nRBC: 0 % (ref 0.0–0.2)

## 2024-01-10 LAB — PROTEIN, PLEURAL OR PERITONEAL FLUID: Total protein, fluid: <3 g/dL

## 2024-01-10 LAB — URINALYSIS, ROUTINE W REFLEX MICROSCOPIC
Bilirubin Urine: NEGATIVE
Glucose, UA: NEGATIVE mg/dL
Hgb urine dipstick: NEGATIVE
Ketones, ur: NEGATIVE mg/dL
Leukocytes,Ua: NEGATIVE
Nitrite: NEGATIVE
Protein, ur: NEGATIVE mg/dL
Specific Gravity, Urine: 1.003 — ABNORMAL LOW (ref 1.005–1.030)
pH: 6 (ref 5.0–8.0)

## 2024-01-10 LAB — GLUCOSE, CAPILLARY: Glucose-Capillary: 229 mg/dL — ABNORMAL HIGH (ref 70–99)

## 2024-01-10 LAB — GRAM STAIN: Gram Stain: NONE SEEN

## 2024-01-10 LAB — PROTIME-INR
INR: 1.9 — ABNORMAL HIGH (ref 0.8–1.2)
Prothrombin Time: 23 s — ABNORMAL HIGH (ref 11.4–15.2)

## 2024-01-10 LAB — ALBUMIN, PLEURAL OR PERITONEAL FLUID: Albumin, Fluid: <1.5 g/dL

## 2024-01-10 MED ORDER — PANTOPRAZOLE SODIUM 40 MG PO TBEC
40.0000 mg | DELAYED_RELEASE_TABLET | Freq: Every day | ORAL | Status: DC
  Administered 2024-01-10 – 2024-01-11 (×2): 40 mg via ORAL
  Filled 2024-01-10 (×2): qty 1

## 2024-01-10 MED ORDER — FENTANYL CITRATE PF 50 MCG/ML IJ SOSY
50.0000 ug | PREFILLED_SYRINGE | INTRAMUSCULAR | Status: DC | PRN
  Administered 2024-01-10: 50 ug via INTRAVENOUS
  Filled 2024-01-10: qty 1

## 2024-01-10 MED ORDER — INSULIN REGULAR HUMAN (CONC) 500 UNIT/ML ~~LOC~~ SOPN: 60.0000 [IU] | PEN_INJECTOR | Freq: Three times a day (TID) | SUBCUTANEOUS | Status: DC

## 2024-01-10 MED ORDER — INSULIN ASPART 100 UNIT/ML IJ SOLN
45.0000 [IU] | Freq: Three times a day (TID) | INTRAMUSCULAR | Status: DC
Start: 2024-01-10 — End: 2024-01-10

## 2024-01-10 MED ORDER — ALBUMIN HUMAN 25 % IV SOLN
25.0000 g | Freq: Once | INTRAVENOUS | Status: AC
  Administered 2024-01-10: 25 g via INTRAVENOUS

## 2024-01-10 MED ORDER — MONTELUKAST SODIUM 10 MG PO TABS
10.0000 mg | ORAL_TABLET | Freq: Every day | ORAL | Status: DC
  Administered 2024-01-10: 10 mg via ORAL
  Filled 2024-01-10: qty 1

## 2024-01-10 MED ORDER — MAGNESIUM SULFATE IN D5W 1-5 GM/100ML-% IV SOLN
1.0000 g | Freq: Once | INTRAVENOUS | Status: AC
  Administered 2024-01-10: 1 g via INTRAVENOUS
  Filled 2024-01-10 (×2): qty 100

## 2024-01-10 MED ORDER — INSULIN ASPART 100 UNIT/ML IJ SOLN
0.0000 [IU] | INTRAMUSCULAR | Status: DC
  Administered 2024-01-10: 5 [IU] via SUBCUTANEOUS
  Administered 2024-01-10: 2 [IU] via SUBCUTANEOUS

## 2024-01-10 MED ORDER — LIDOCAINE-EPINEPHRINE 1 %-1:100000 IJ SOLN
INTRAMUSCULAR | Status: AC
  Filled 2024-01-10: qty 1

## 2024-01-10 MED ORDER — NALOXONE HCL 0.4 MG/ML IJ SOLN: 0.4000 mg | INTRAMUSCULAR | Status: DC | PRN

## 2024-01-10 MED ORDER — FUROSEMIDE 40 MG PO TABS
80.0000 mg | ORAL_TABLET | Freq: Every day | ORAL | Status: DC
  Administered 2024-01-10 – 2024-01-11 (×2): 80 mg via ORAL
  Filled 2024-01-10: qty 2
  Filled 2024-01-10: qty 4

## 2024-01-10 MED ORDER — LACTULOSE 10 GM/15ML PO SOLN
20.0000 g | Freq: Three times a day (TID) | ORAL | Status: DC
  Administered 2024-01-10 – 2024-01-11 (×4): 20 g via ORAL
  Filled 2024-01-10 (×4): qty 30

## 2024-01-10 MED ORDER — CALCIUM CARBONATE ANTACID 500 MG PO CHEW: 2.0000 | CHEWABLE_TABLET | ORAL | Status: DC | PRN

## 2024-01-10 MED ORDER — HYDROMORPHONE HCL 2 MG PO TABS
1.0000 mg | ORAL_TABLET | Freq: Four times a day (QID) | ORAL | Status: DC | PRN
  Administered 2024-01-10 – 2024-01-11 (×3): 1 mg via ORAL
  Filled 2024-01-10 (×3): qty 1

## 2024-01-10 MED ORDER — POTASSIUM CHLORIDE CRYS ER 20 MEQ PO TBCR
40.0000 meq | EXTENDED_RELEASE_TABLET | ORAL | Status: AC
Start: 2024-01-10 — End: 2024-01-10
  Administered 2024-01-10 (×2): 40 meq via ORAL
  Filled 2024-01-10 (×2): qty 2

## 2024-01-10 MED ORDER — INSULIN ASPART 100 UNIT/ML IJ SOLN
10.0000 [IU] | Freq: Three times a day (TID) | INTRAMUSCULAR | Status: DC
  Administered 2024-01-10 – 2024-01-11 (×4): 10 [IU] via SUBCUTANEOUS

## 2024-01-10 MED ORDER — MIDODRINE HCL 5 MG PO TABS
10.0000 mg | ORAL_TABLET | Freq: Two times a day (BID) | ORAL | Status: DC
Start: 2024-01-10 — End: 2024-01-11
  Administered 2024-01-10 – 2024-01-11 (×3): 10 mg via ORAL
  Filled 2024-01-10 (×3): qty 2

## 2024-01-10 MED ORDER — ALBUTEROL SULFATE (2.5 MG/3ML) 0.083% IN NEBU: 2.5000 mg | INHALATION_SOLUTION | Freq: Four times a day (QID) | RESPIRATORY_TRACT | Status: DC | PRN

## 2024-01-10 MED ORDER — FLUOXETINE HCL 40 MG PO CAPS: 40.0000 mg | ORAL_CAPSULE | Freq: Two times a day (BID) | ORAL | Status: DC

## 2024-01-10 MED ORDER — LORAZEPAM 0.5 MG PO TABS
0.5000 mg | ORAL_TABLET | Freq: Every evening | ORAL | Status: DC | PRN
  Administered 2024-01-11: 0.5 mg via ORAL
  Filled 2024-01-10: qty 1

## 2024-01-10 MED ORDER — BISACODYL 5 MG PO TBEC: 10.0000 mg | DELAYED_RELEASE_TABLET | Freq: Every day | ORAL | Status: DC | PRN

## 2024-01-10 MED ORDER — DIPHENHYDRAMINE HCL 50 MG/ML IJ SOLN
25.0000 mg | Freq: Four times a day (QID) | INTRAMUSCULAR | Status: DC | PRN
  Administered 2024-01-10 – 2024-01-11 (×4): 25 mg via INTRAVENOUS
  Filled 2024-01-10 (×4): qty 1

## 2024-01-10 MED ORDER — RIFAXIMIN 550 MG PO TABS
550.0000 mg | ORAL_TABLET | Freq: Every day | ORAL | Status: DC
  Administered 2024-01-10 – 2024-01-11 (×2): 550 mg via ORAL
  Filled 2024-01-10 (×2): qty 1

## 2024-01-10 MED ORDER — LIDOCAINE-EPINEPHRINE 1 %-1:100000 IJ SOLN
20.0000 mL | Freq: Once | INTRAMUSCULAR | Status: AC
  Administered 2024-01-10: 10 mL via INTRADERMAL

## 2024-01-10 MED ORDER — FLUOXETINE HCL 20 MG PO CAPS
40.0000 mg | ORAL_CAPSULE | Freq: Every day | ORAL | Status: DC
  Administered 2024-01-10 – 2024-01-11 (×2): 40 mg via ORAL
  Filled 2024-01-10 (×2): qty 2

## 2024-01-10 MED ORDER — SPIRONOLACTONE 100 MG PO TABS
300.0000 mg | ORAL_TABLET | Freq: Every day | ORAL | Status: DC
  Administered 2024-01-10 – 2024-01-11 (×2): 300 mg via ORAL
  Filled 2024-01-10 (×2): qty 3
  Filled 2024-01-10: qty 12

## 2024-01-10 MED ORDER — ROPINIROLE HCL 1 MG PO TABS
1.0000 mg | ORAL_TABLET | Freq: Every day | ORAL | Status: DC
  Administered 2024-01-10: 1 mg via ORAL
  Filled 2024-01-10: qty 1

## 2024-01-10 MED ORDER — INSULIN ASPART 100 UNIT/ML IJ SOLN
0.0000 [IU] | Freq: Three times a day (TID) | INTRAMUSCULAR | Status: DC
  Administered 2024-01-10: 3 [IU] via SUBCUTANEOUS
  Administered 2024-01-10: 2 [IU] via SUBCUTANEOUS
  Administered 2024-01-11: 3 [IU] via SUBCUTANEOUS
  Administered 2024-01-11: 7 [IU] via SUBCUTANEOUS

## 2024-01-10 NOTE — Progress Notes (Signed)
 Laurie Sutton  FMW:[id number] DOB: June 06, 1980 DOA: 01/09/2024 PCP: [name], MD    Brief Narrative:  43 year old with a history of [name] followed in the [facility]/transplant clinic, esophageal varices, portal hypertensive gastropathy, hepatic encephalopathy, ascites, pancytopenia, DM2, prior C. difficile colitis, GERD, and anxiety/depression who presented to the ER 9/29 complaining of 2 days of worsening abdominal distention and shortness of breath.  She has been getting weekly large-volume paracentesis with her last drainage 9/26 producing 9 L of fluid.  She has been admitted twice since early August, once for rectal bleeding and the second time for decompensated cirrhosis with hepatic encephalopathy and ascites.  At the time of this presentation she reported significant difficulty breathing due to rapid reaccumulation of ascites with abdominal distention.  She denied fevers or significant bleeding.  No hematemesis or melena.  She reported strict compliance with all of her home medications.  She had been recently treated for a UTI in the outpatient setting.  Goals of Care:   Code Status: Full Code   DVT prophylaxis: SCDs Start: 01/10/24 0534   Interim Hx: The patient was interviewed by one of my partners earlier today.  She has been afebrile since presentation.  Assessment & Plan:  Decompensated NASH cirrhosis with rapidly reaccumulating ascites At baseline requires weekly large-volume paracentesis - last paracentesis 01/06/2024 producing 9 L of fluid -no features to suggest SBP at present - not currently encephalopathic  Dyspnea Due to abdominal compartment syndrome -paracentesis for resolution -no significant hypoxia appreciated  Chronic pancytopenia due to cirrhosis of the liver Cell counts stable at present with no active bleeding  Hypokalemia Replace as needed and follow  Dysuria Recently completed treatment for UTI as an outpatient  DM  2 A1c 6.71-week ago - on high dose insulin  using U-500 60 units 3 times daily at home -substitute NovoLog  while hospitalized and supplement with SSI   Family Communication:  Disposition:     Objective: Blood pressure (!) 120/58, pulse 96, temperature 98.4 F (36.9 C), temperature source Oral, resp. rate 16, height 5' 6 (1.676 m), weight 86.2 kg, last menstrual period 11/13/2020, SpO2 100%. No intake or output data in the 24 hours ending 01/10/24 0825 Filed Weights   01/09/24 2020  Weight: 86.2 kg    Examination: The patient was examined by one of my partners earlier today.  CBC: Recent Labs  Lab 01/06/24 1357 01/09/24 2203  WBC 1.8*  1.7* 2.6*  NEUTROABS 1.2* 1.6*  HGB 8.0*  8.2* 9.2*  HCT 25.0*  25.3* 28.7*  MCV 90.3  90.4 92.3  PLT 61*  59* 63*   Basic Metabolic Panel: Recent Labs  Lab 01/06/24 1142 01/09/24 2203  NA 128* 127*  K 3.2* 3.2*  CL 98 97*  CO2 20* 21*  GLUCOSE 251* 399*  BUN 6 5*  CREATININE 0.69 0.76  CALCIUM  8.6* 8.4*  MG  --  1.7   GFR: Estimated Creatinine Clearance: 101.4 mL/min (by C-G formula based on SCr of 0.76 mg/dL).   Scheduled Meds:  furosemide   80 mg Oral Daily   insulin  aspart  0-9 Units Subcutaneous Q4H   insulin  aspart  45 Units Subcutaneous TID WC   lactulose   20 g Oral TID   lidocaine   10 mL Infiltration Once   midodrine   10 mg Oral BID WC   montelukast   10 mg Oral QHS   pantoprazole   40 mg Oral Daily   potassium chloride   40 mEq Oral Q4H  rifaximin   550 mg Oral Daily   rOPINIRole   1 mg Oral QHS   spironolactone   300 mg Oral Daily   Continuous Infusions:  magnesium  sulfate bolus IVPB       LOS: 0 days   Reyes IVAR Moores, MD [facility] Office  [phone] Pager - Text Page per Amion  If 7PM-7AM, please contact night-coverage per Amion 01/10/2024, 8:25 AM

## 2024-01-10 NOTE — H&P (Signed)
 History and Physical    Laurie Sutton FMW:[id number] DOB: Mar 08, 1981 DOA: 01/09/2024  PCP: [name], MD  Patient coming from: Home  Chief Complaint: Abdominal distention  HPI: Laurie Sutton is a 43 y.o. female with medical history significant of NASH cirrhosis, esophageal varices, portal hypertensive gastropathy, ascites, hepatic encephalopathy, pancytopenia, type 2 diabetes, C. difficile colitis, GERD, anxiety/depression/ADHD.  Admitted to the hospital last month 8/8-8/12 for rectal bleeding which was felt to be hemorrhoidal.  Admitted again 9/4-9/8 for decompensated cirrhosis with hepatic encephalopathy and ascites.  She follows with [facility] in [place] and [facility] in [place].  She has been getting weekly large-volume paracentesis, last underwent paracentesis twice on 9/26 with a total of 9 L fluid removed.  A week ago she was diagnosed with UTI by PCP and treated with a course of ciprofloxacin .  Patient presents to the ED today for evaluation of abdominal distention and shortness of breath.  Family reported intermittent confusion largely unchanged from baseline.  Patient tachycardic and tachypneic.  Afebrile.  Not hypotensive or hypoxic.  CBC showing stable pancytopenia with WBC count 2.6, hemoglobin 9.2, platelet count 63k.  CMP showing sodium 127, potassium 3.2, chloride 97, bicarb 21, anion gap 9, glucose 399, BUN 5, creatinine 0.7, calcium  8.4, albumin  2.9, T. bili 2.6 (stable), transaminases and alkaline phosphatase normal, INR 1.9, ammonia level 36 (improved), magnesium  1.7.  Chest x-ray showing no active disease.  [facility] consulted and will see the patient in the morning.  Patient was given Benadryl  and fentanyl .  [facility] called to admit.  Patient is reporting worsening abdominal distention and pain since after her last paracentesis.  States it is difficult for her to breathe because her abdomen is so distended.  Denies fevers.  She reports  history of hemorrhoids with occasional mild bleeding but has not had any bleeding for over a week.  Denies hematemesis or melena.  She is taking all of her home medications and currently being evaluated for liver transplant at [facility] in [place] but states she is not yet on the waiting list.  She was recently treated for UTI and continues to have some mild dysuria.  No other complaints.  Denies chest pain or shortness of breath.  Review of Systems:  Review of Systems  All other systems reviewed and are negative.   Past Medical History:  Diagnosis Date   ADHD    Allergy    Anemia    IRON TRANSFUSION 07-2020 NONE SINCE   Anxiety    Back pain    COVID 08/12/2019   ALL SYMPTOMS REOLVED PER PT   Depression    Diabetic neuropathy (HCC) 11/11/2020   FEET   dm type 2    sees Dr. Odella Jacobson at [facility]   Fatty liver    GERD (gastroesophageal reflux disease)    History of kidney stones    Joint pain    Menorrhagia 11/11/2020   Migraines    Murmur, cardiac    FAINT NO CARDIOLOGIST   Other fatigue    Pneumonia 08/12/2019   COVID PNEUMONIA ALL SYMPTOMS RESOLVED PER PT   Shortness of breath on exertion     Past Surgical History:  Procedure Laterality Date   CESAREAN SECTION  12/13/2006   COLONOSCOPY WITH PROPOFOL  N/A 08/02/2023   Procedure: COLONOSCOPY WITH PROPOFOL ;  Surgeon: Legrand Victory LITTIE DOUGLAS, MD;  Location: [facility];  Service: Gastroenterology;  Laterality: N/A;   ESOPHAGOGASTRODUODENOSCOPY (EGD) WITH PROPOFOL  N/A 08/02/2023  Procedure: ESOPHAGOGASTRODUODENOSCOPY (EGD) WITH PROPOFOL ;  Surgeon: Legrand Victory LITTIE DOUGLAS, MD;  Location: [facility];  Service: Gastroenterology;  Laterality: N/A;   EXTRACORPOREAL SHOCK WAVE LITHOTRIPSY  2018   FLEXIBLE SIGMOIDOSCOPY N/A 11/20/2023   Procedure: KINGSTON SIDE;  Surgeon: Albertus Gordy HERO, MD;  Location: [facility];  Service: Gastroenterology;  Laterality: N/A;   FOOT SURGERY Right 10/02/2020   RIGHT FOOT HEEL AND TOES,  [facility] OFF [address]   IR PARACENTESIS  07/06/2023   IR PARACENTESIS  08/18/2023   IR PARACENTESIS  10/05/2023   IR PARACENTESIS  10/11/2023   IR PARACENTESIS  10/20/2023   IR PARACENTESIS  11/08/2023   IR PARACENTESIS  11/28/2023   IR PARACENTESIS  12/09/2023   IR PARACENTESIS  12/15/2023   IR PARACENTESIS  12/16/2023   IR PARACENTESIS  12/23/2023   IR PARACENTESIS  12/30/2023   IR PARACENTESIS  01/06/2024   IR PARACENTESIS  01/06/2024   IR TRANSCATHETER BX  07/26/2023   IR US  GUIDE VASC ACCESS RIGHT  07/26/2023   IR VENOGRAM HEPATIC W HEMODYNAMIC EVALUATION  07/26/2023   LAPAROSCOPIC VAGINAL HYSTERECTOMY WITH SALPINGECTOMY Bilateral 11/17/2020   Procedure: LAPAROSCOPIC ASSISTED VAGINAL HYSTERECTOMY WITH BILATERAL SALPINGECTOMY;  Surgeon: Dannielle Bouchard, DO;  Location: [facility];  Service: Gynecology;  Laterality: Bilateral;   POLYPECTOMY  08/02/2023   Procedure: POLYPECTOMY, INTESTINE;  Surgeon: Legrand Victory LITTIE DOUGLAS, MD;  Location: [facility];  Service: Gastroenterology;;     reports that she has never smoked. She has been exposed to tobacco smoke. She has never used smokeless tobacco. She reports that she does not drink alcohol and does not use drugs.  Allergies  Allergen Reactions   Vancomycin  Itching   Amoxicillin Hives and Itching   Azithromycin      DOES NOT WORK   Dulaglutide  Other (See Comments)    constipation and stomach issues  **Trulicity **    Empagliflozin -Metformin  Hcl Er Other (See Comments) and Swelling   Januvia  [Sitagliptin ] Other (See Comments)    HURTS STOAMCH   Latex     IRRITATES VAGINAL AREA   Metformin      Other Reaction(s): achy all over   Morphine  Itching    Severe    Penicillins Hives    Has patient had a PCN reaction causing immediate rash, facial/tongue/throat swelling, SOB or lightheadedness with hypotension: yes. Rash  Has patient had a PCN reaction causing severe rash involving mucus membranes or skin necrosis: Yes- rash and hives all  over body  Has patient had a PCN reaction that required hospitalization No Has patient had a PCN reaction occurring within the last 10 years: Yes  If all of the above answers are NO, then may proceed with Cephalosporin use.    Vitamin K  And Related Other (See Comments)    Electric shock     Family History  Problem Relation Age of Onset   Colon polyps Mother    Depression Mother    Anxiety disorder Mother    Colon polyps Father    Sleep apnea Father    Anxiety disorder Father    Depression Father    Diabetes Father    Bladder Cancer Father 24   Rheum arthritis Father    Migraines Maternal Aunt    Migraines Maternal Grandmother    Diabetes Maternal Grandfather    Healthy Daughter    Asthma Daughter    Anxiety disorder Daughter    Anxiety disorder Son    Asthma Son    Healthy Son  Cerebral aneurysm Cousin    Heart disease Other    Depression Other    Anxiety disorder Other    Sleep apnea Other    Colon cancer Neg Hx    Esophageal cancer Neg Hx    Stomach cancer Neg Hx    Rectal cancer Neg Hx     Prior to Admission medications   Medication Sig Start Date End Date Taking? Authorizing Provider  acetaminophen  (TYLENOL ) 325 MG tablet Take 2 tablets (650 mg total) by mouth every 6 (six) hours as needed for mild pain (pain score 1-3) or fever (or Fever >/= 101). 10/07/23  Yes Sheikh, Omair Latif, DO  albuterol  (VENTOLIN  HFA) 108 (90 Base) MCG/ACT inhaler INHALE 2 PUFFS INTO THE LUNGS UP TO EVERY 6 HOURS AS NEEDED FOR WHEEZING/SHORTNESS OF BREATH 08/20/22  Yes [name], MD  bisacodyl  (DULCOLAX) 5 MG EC tablet Take 2 tablets (10 mg total) by mouth daily as needed for moderate constipation. 12/19/23  Yes Dahal, Chapman, MD  calcium  carbonate (TUMS - DOSED IN MG ELEMENTAL CALCIUM ) 500 MG chewable tablet Chew 2 tablets by mouth as needed for indigestion or heartburn.   Yes [provider]  ciprofloxacin  (CIPRO ) 500 MG tablet Take 1 tablet (500 mg total) by mouth 2 (two)  times daily. 01/03/24  Yes [name], MD  clotrimazole -betamethasone  (LOTRISONE ) cream APPLY 1 APPLICATION TOPICALLY TWICE A DAY AS NEEDED 05/26/23  Yes [name], MD  Continuous Glucose Sensor (DEXCOM G7 SENSOR) MISC Use 1 sensor for continuous glucose monitoring every 10 days for 30 days 05/25/23  Yes Braulio Hough, MD  FLUoxetine  (PROZAC ) 40 MG capsule Take 1 capsule (40 mg total) by mouth 2 (two) times daily. 01/03/24  Yes [name], MD  furosemide  (LASIX ) 80 MG tablet Take 1 tablet (80 mg total) by mouth daily. 10/11/23  Yes Sheikh, Omair Latif, DO  HUMULIN  R U-500 KWIKPEN 500 UNIT/ML KwikPen Use 120 units at breakfast, 100 at lunch, and 100 at dinner Patient taking differently: Use 60 units at breakfast, 65 at lunch, and 65 at dinner 04/08/22  Yes [name], MD  hydrocortisone  2.5 % cream Use 3 (three) times daily rectally Patient taking differently: Apply 1 Application topically 3 (three) times daily as needed. 11/22/23  Yes Dennise Lavada POUR, MD  HYDROmorphone  (DILAUDID ) 2 MG tablet Take 0.5 tablets (1 mg total) by mouth every 6 (six) hours as needed for severe pain (pain score 7-10). 01/06/24  Yes Garrick Charleston, MD  lactulose  (CHRONULAC ) 10 GM/15ML solution Take 30 mLs (20 g total) by mouth 4 (four) times daily. Patient taking differently: Take 20 g by mouth 3 (three) times daily. 12/19/23 03/18/24 Yes Dahal, Chapman, MD  LORazepam  (ATIVAN ) 0.5 MG tablet Take 0.5 mg by mouth at bedtime.   Yes [provider]  midodrine  (PROAMATINE ) 10 MG tablet Take 1 tablet (10 mg total) by mouth 2 (two) times daily with a meal. 12/19/23 03/18/24 Yes Dahal, Chapman, MD  montelukast  (SINGULAIR ) 10 MG tablet Take 1 tablet (10 mg total) by mouth at bedtime. 08/25/22  Yes [name], MD  omeprazole  (PRILOSEC) 40 MG capsule TAKE 1 CAPSULE (40 MG TOTAL) BY MOUTH IN THE MORNING 10/23/23  Yes Danis, Victory LITTIE MOULD, MD  ondansetron  (ZOFRAN -ODT) 4 MG disintegrating tablet Take 1 tablet (4 mg total) by  mouth every 8 (eight) hours as needed for nausea or vomiting. 01/06/24  Yes Garrick Charleston, MD  prochlorperazine  (COMPAZINE ) 10 MG tablet Take 1 tablet (10  mg total) by mouth every 8 (eight) hours as needed for nausea or vomiting. 12/29/23  Yes Danis, Victory LITTIE MOULD, MD  rOPINIRole  (REQUIP ) 1 MG tablet Take 1 tablet (1 mg total) by mouth at bedtime. 01/03/24  Yes [name], MD  spironolactone  (ALDACTONE ) 100 MG tablet Take 3 tablets (300 mg total) by mouth daily. Patient taking differently: Take 300 mg by mouth daily. Taking total (150 mg) 11/01/23 04/11/24 Yes Sira, Zackery, MD  Vitamin D , Ergocalciferol , (DRISDOL ) 1.25 MG (50000 UNIT) CAPS capsule Take 50,000 Units by mouth every 7 (seven) days. No specific day 11/19/23  Yes [provider]  XIFAXAN  550 MG TABS tablet Take 550 mg by mouth in the morning. 10/27/23  Yes [provider]  morphine  (MSIR) 15 MG tablet Take 0.5 tablets (7.5 mg total) by mouth every 6 (six) hours as needed for severe pain (pain score 7-10). Patient not taking: Reported on 01/10/2024 01/06/24   Garrick Charleston, MD  Multiple Vitamin (MULTIVITAMIN WITH MINERALS) TABS tablet Take 1 tablet by mouth daily. Patient not taking: Reported on 01/10/2024 12/20/23 03/19/24  Arlice Reichert, MD  Nutritional Supplements (,FEEDING SUPPLEMENT, PROSOURCE PLUS) liquid Take 30 mLs by mouth 3 (three) times daily between meals. Patient not taking: Reported on 01/10/2024 12/19/23   Arlice Reichert, MD  ondansetron  (ZOFRAN ) 4 MG tablet Take 1 tablet (4 mg total) by mouth every 8 (eight) hours as needed for nausea or vomiting. Patient not taking: Reported on 01/10/2024 12/27/23   Legrand Victory LITTIE MOULD, MD    Physical Exam: Vitals:   01/10/24 0145 01/10/24 0200 01/10/24 0215 01/10/24 0245  BP: 109/61 110/64 109/63 116/64  Pulse: (!) 102 (!) 101 (!) 103 (!) 102  Resp: 18 18 20 19   Temp:      TempSrc:      SpO2: 99% 100% 98% 100%  Weight:      Height:        Physical Exam Vitals  reviewed.  Constitutional:      General: She is not in acute distress. HENT:     Head: Normocephalic and atraumatic.  Eyes:     Extraocular Movements: Extraocular movements intact.  Cardiovascular:     Rate and Rhythm: Normal rate and regular rhythm.     Heart sounds: Normal heart sounds.  Pulmonary:     Effort: Pulmonary effort is normal. No respiratory distress.     Breath sounds: Normal breath sounds.  Abdominal:     General: Bowel sounds are normal. There is distension.     Tenderness: There is abdominal tenderness.     Comments: Abdomen is very distended  Musculoskeletal:     Cervical back: Normal range of motion.     Right lower leg: No edema.     Left lower leg: No edema.  Skin:    General: Skin is warm and dry.  Neurological:     General: No focal deficit present.     Mental Status: She is alert and oriented to person, place, and time.     Labs on Admission: I have personally reviewed following labs and imaging studies  CBC: Recent Labs  Lab 01/06/24 1357 01/09/24 2203  WBC 1.8*  1.7* 2.6*  NEUTROABS 1.2* 1.6*  HGB 8.0*  8.2* 9.2*  HCT 25.0*  25.3* 28.7*  MCV 90.3  90.4 92.3  PLT 61*  59* 63*   Basic Metabolic Panel: Recent Labs  Lab 01/06/24 1142 01/09/24 2203  NA 128* 127*  K 3.2* 3.2*  CL  98 97*  CO2 20* 21*  GLUCOSE 251* 399*  BUN 6 5*  CREATININE 0.69 0.76  CALCIUM  8.6* 8.4*  MG  --  1.7   GFR: Estimated Creatinine Clearance: 101.4 mL/min (by C-G formula based on SCr of 0.76 mg/dL). Liver Function Tests: Recent Labs  Lab 01/06/24 1142 01/09/24 2203  AST 32 34  ALT 19 21  ALKPHOS 95 98  BILITOT 2.7* 2.6*  PROT 6.8 6.3*  ALBUMIN  3.7 2.9*   Recent Labs  Lab 01/06/24 1142  LIPASE 41   Recent Labs  Lab 01/06/24 1245 01/09/24 2203  AMMONIA 60* 36*   Coagulation Profile: Recent Labs  Lab 01/09/24 2203  INR 1.9*   Cardiac Enzymes: No results for input(s): CKTOTAL, CKMB, CKMBINDEX, TROPONINI in the last 168  hours. BNP (last 3 results) No results for input(s): PROBNP in the last 8760 hours. HbA1C: No results for input(s): HGBA1C in the last 72 hours. CBG: No results for input(s): GLUCAP in the last 168 hours. Lipid Profile: No results for input(s): CHOL, HDL, LDLCALC, TRIG, CHOLHDL, LDLDIRECT in the last 72 hours. Thyroid  Function Tests: No results for input(s): TSH, T4TOTAL, FREET4, T3FREE, THYROIDAB in the last 72 hours. Anemia Panel: No results for input(s): VITAMINB12, FOLATE, FERRITIN, TIBC, IRON, RETICCTPCT in the last 72 hours. Urine analysis:    Component Value Date/Time   COLORURINE YELLOW 01/06/2024 1245   APPEARANCEUR CLEAR 01/06/2024 1245   LABSPEC 1.009 01/06/2024 1245   PHURINE 6.0 01/06/2024 1245   GLUCOSEU NEGATIVE 01/06/2024 1245   HGBUR NEGATIVE 01/06/2024 1245   BILIRUBINUR NEGATIVE 01/06/2024 1245   BILIRUBINUR neg 01/03/2024 1351   KETONESUR NEGATIVE 01/06/2024 1245   PROTEINUR NEGATIVE 01/06/2024 1245   UROBILINOGEN 1.0 01/03/2024 1351   UROBILINOGEN 1.0 12/21/2014 1020   NITRITE NEGATIVE 01/06/2024 1245   LEUKOCYTESUR TRACE (A) 01/06/2024 1245    Radiological Exams on Admission: DG Chest Portable 1 View Result Date: 01/09/2024 CLINICAL DATA:  Shortness of breath. EXAM: PORTABLE CHEST 1 VIEW COMPARISON:  Chest radiograph dated 12/15/2023 FINDINGS: No focal consolidation, pleural effusion or pneumothorax. The cardiac silhouette is within normal limits. No acute osseous pathology. IMPRESSION: No active disease. Electronically Signed   By: Vanetta Chou M.D.   On: 01/09/2024 21:14    EKG: Independently reviewed.  Sinus rhythm, LVH, T wave abnormalities, QTc 566.  No significant change since previous tracing.  Assessment and Plan  Decompensated NASH cirrhosis with recurrent ascites She requires weekly large-volume paracentesis.  Last underwent paracentesis twice on 01/06/2024 with a total of 9 L fluid removed.  ?SBP  given no fever or leukocytosis.  Placed IR consult for repeat paracentesis/peritoneal fluid analysis labs ordered to rule out SBP.  No signs of hepatic encephalopathy at this time.  Patient is AAO x 4 and answering questions appropriately.  Ammonia level improved.  Continue home meds including Lasix , rifaximin , Aldactone , midodrine , and lactulose .  [facility] consulted.  Dyspnea Likely due to significant abdominal distention from ascites.  Not hypoxic.  Chest x-ray showing no active disease.  Needs paracentesis as above.  Chronic pancytopenia in the setting of liver disease Counts appear stable and no active bleeding.  Monitor CBC.  Pseudohyponatremia In the setting of hyperglycemia.  Corrected sodium 134, monitor labs.  Hypokalemia QT prolongation Replace electrolytes to keep K >4 and Mag >2.  Avoid QT prolonging drugs.  Dysuria Recently treated for UTI and continues to have some dysuria.  Repeat UA ordered.  Type 2 diabetes A1c 6.7 on 01/03/2024.  Glucose  399 without signs of DKA.  Continue home Humulin  R U-500 60 units 3 times a day.  Placed on CBG checks every 4 hours/sensitive sliding scale insulin  as needed for now as she is n.p.o.  per protocol for paracentesis.  GERD Continue PPI.  Anxiety/depression Hold fluoxetine  in the setting of QT prolongation.  Continue Ativan  PRN  DVT prophylaxis: SCDs Code Status: Full Code (discussed with the patient) Level of care: Progressive Care Unit Admission status: It is my clinical opinion that referral for OBSERVATION is reasonable and necessary in this patient based on the above information provided. The aforementioned taken together are felt to place the patient at high risk for further clinical deterioration. However, it is anticipated that the patient may be medically stable for discharge from the hospital within 24 to 48 hours.  Editha Ram MD [facility]  If 7PM-7AM, please contact  night-coverage [email or web address]  01/10/2024, 4:05 AM

## 2024-01-10 NOTE — Procedures (Signed)
 PROCEDURE SUMMARY:  Successful image-guided paracentesis from the right abdomen.  Yielded 7.0 liters of clear, straw-colored peritoneal fluid.  No immediate complications.  EBL: zero Patient tolerated well.   Specimen was sent for labs.  Please see imaging section of Epic for full dictation.  Keatin Benham A Anola Mcgough PA-C 01/10/2024 10:59 AM

## 2024-01-10 NOTE — Inpatient Diabetes Management (Addendum)
 Inpatient Diabetes Program Recommendations  AACE/ADA: New Consensus Statement on Inpatient Glycemic Control  Target Ranges:  Prepandial:   less than 140 mg/dL      Peak postprandial:   less than 180 mg/dL (1-2 hours)      Critically ill patients:  140 - 180 mg/dL    Latest Reference Range & Units 01/10/24 06:10 01/10/24 07:43  Glucose-Capillary 70 - 99 mg/dL 819 (H) 718 (H)   Review of Glycemic Control  Diabetes history: DM2 Outpatient Diabetes medications: Humulin  R U500 60 units with breakfast, 65 units with lunch and supper Current orders for Inpatient glycemic control: Novolog  45 units TID with meals, Novolog  0-9 units TID with meals, Novolog  0-5 units QHS  Inpatient Diabetes Program Recommendations:    Insulin : Please decrease Novolog  meal coverage to 5 units TID with meals if patient eats at least 50% of meals and order Lantus  9 units Q24H (based on 86.2 kg x 0.1 unit).    NOTE: Patient admitted with NASH cirrhosis with recurrent ascites, and dyspnea. Patient was recently inpatient 12/15/23-12/19/23 and inpatient diabetes coordinator spoke with patient on 12/19/23 during admission. Patient was ordered Lantus  10 units daily, Novolog  correction, and Novolog  meal coverage for glycemic control.    Addendum 01/10/24@12 :05-Spoke with patient at bedside in ED regarding DM and insulin  regimen. Patient reports that she is prescribed Humulin  R U500 60 units with breakfast, 65 units with lunch, and 65 units with supper.  However, she is decreasing or skipping the dose when she does not eat or when her glucose is running on the low side (under 100 mg/dl). Patient reports that if she takes even half dose at night with supper, her glucose ends up going low during the night. Patient reports that she sees Dr. Braulio (Endocrinologist) but she has not seen in her a while due to missing appointments due to being at the hospital.  Patient also reports that she uses a Dexcom G7 sensor for glucose monitoring. Patient  reports that her last A1C was 6.7% on 01/03/24 and she does not understand how her A1C can be good but her glucose values are not. Patient reports she frequently has hypoglycemia.   Discussed that if she is not taking insulin  consistently, glucose may be running higher (especially when she skips doses) and then going low at times which could be causing her A1C to be inaccurate.  Showed patient how to record her insulin  dose or meal intake on the Dexcom app. Asked that she start recording exactly how much insulin  she is taking and meal intake. Explained that Dr. Braulio could use the information to try to help her make adjustments with insulin  if needed.  Patient is in the process of trying to get on the liver transplant list. Discussed importance of DM control especially if she is able to get on transplant list. Patient states she will call Dr. Braulio to get a follow up appointment.  Patient reports she has all needed DM medication and supplies at home. Discussed current insulin  orders using Lantus  and Novolog .   Patient verbalized understanding of information and has no questions at this time. Thanks, Earnie Gainer, RN, MSN, CDCES Diabetes Coordinator Inpatient Diabetes Program [phone] (Team Pager from 8am to 5pm)

## 2024-01-10 NOTE — Consult Note (Addendum)
 Consultation Note   Referring Provider:   [facility] PCP: [name], MD Primary Gastroenterologist:   Victory Brand, MD. Also followed by [facility] Reason for Consultation: Decompensated cirrhosis DOA: 01/09/2024         Hospital Day: 2   ASSESSMENT    43 year old female with decompensated MASH cirrhosis with a history of portal hypertensive gastropathy, esophageal varices, hepatic encephalopathy, recurrent ascites. Multiple hospitalizations over the last few months for decompensation.  She has been requiring very frequent LVP's . Followed by [facility].   Readmitted today with abdominal distention / SHOB.  MELD 3.0: 25 at 01/09/2024 10:03 PM.  CT AP without acute findings - mild to moderate ascites. Underwent   7 Liter LVP this a.m. Cell count negative for SBP. Feels better, less abdominal discomfort and breathing better.  Mentation is at baseline  Recent UTI No dysuria.Treated with Cipro . UA today with trace leukocytes, rare bacteria. Neg nitrites.  Hypokalemia K+ 3.2 on yesterday's labs  Prolonged QT  Chronic GERD  Cholelithiasis Asymptomatic. No evidence for acute cholecystitis on imaging  See PMH for any additional medical history  / medical problems   PLAN:   --Continue twice daily rifaximin  --Continue home Lasix  80 mg daily -- Continue home spironolactone  300 mg daily -- Monitor renal function -- 2 g sodium restricted diet -- Continue lactulose   -- Continue pantoprazole  40 mg daily for history of GERD -- Minimize use of pain meds /benzodiazepines --Hopefully home tomorrow.  She has an appointment with [facility] on Thursday  HPI   Brief GI history :  Astha was hospitalized earlier this month with recurrent hepatic encephalopathy and generalized abdominal pain.  During that admission she had LVP which was negative for SBP .  Etiology of her generalized abdominal  pain was not determined.  No acute findings on two CT scans during that admission. Discharged home on 12/19/23.  She has since continued to require frequent LVP's.  She was seen in the ED on 01/06/2024 with abdominal pain.  CT scan was done and reassuring.  Labs were reassuring as well except for elevated lactic acid.   She had a 7 L LVP performed  Interval History: Kaprice presented back to the ED yesterday with shortness of breath, confusion and abdominal distention .  Her labs were at or near baseline. No active disease on chest x-ray.  She was not hypoxic.  CT scan showed mild to moderate ascites, no acute findings .  She was admitted for further evaluation and treatment .  She underwent a 7 L LVP today, no SBP.   Relevant workup thus far:  WBC 2.6 Hemoglobin 9.2 Platelets 63 Sodium 127 Potassium 3.2 Glucose 399 Renal function normal Albumin  2.9 Total bilirubin 2.6 INR 1.9 Ammonia 36  CT AP with contrast IMPRESSION: 1. Stable findings of cirrhosis and portal venous hypertension, manifested by ascites, splenomegaly, and abdominal varices. 2. Continued wall thickening of the small and large bowel most consistent with portal colopathy/enteropathy given cirrhosis and presence of ascites. 3. Cholelithiasis without cholecystitis.  Kynley feels better after her LVP today.  Breathing easier.  The abdominal pain she had on admission Dr. LELON Savory is going to be heading to as intermittent, generalized.  The pain was similar to what she has experienced during previous hospital admissions when nothing acute has been found on imaging. She reports compliance with Aldactone  and Lasix .  She tries to follow a 2 g sodium restricted diet but does not keep a strict tab sodium intake .  She reports compliance with lactulose  at home.  Has 3-5 bowel movements a day.  Also taking Xifaxan .    She has no nausea, vomiting.  No urinary symptoms.     Pertinent GI Studies   EGD 08/02/2023 for follow-up on  esophageal varices --Grade 2 esophageal varices --Portal hypertensive gastropathy --Nodular mucosa in the antrum --Normal examined duodenum --No specimens collected   Colonoscopy 08/02/2023 for rectal bleeding -Internal hemorrhoids  -Congested mucosa in the entire colon (portal colopathy) -One 4 mm polyp in the transverse colon was removed -Redundant colon   Labs and Imaging:  Recent Labs    01/09/24 2203  PROT 6.3*  ALBUMIN  2.9*  AST 34  ALT 21  ALKPHOS 98  BILITOT 2.6*   Recent Labs    01/09/24 2203  WBC 2.6*  HGB 9.2*  HCT 28.7*  MCV 92.3  PLT 63*   Recent Labs    01/09/24 2203  NA 127*  K 3.2*  CL 97*  CO2 21*  GLUCOSE 399*  BUN 5*  CREATININE 0.76  CALCIUM  8.4*     DG Chest Portable 1 View CLINICAL DATA:  Shortness of breath.  EXAM: PORTABLE CHEST 1 VIEW  COMPARISON:  Chest radiograph dated 12/15/2023  FINDINGS: No focal consolidation, pleural effusion or pneumothorax. The cardiac silhouette is within normal limits. No acute osseous pathology.  IMPRESSION: No active disease.  Electronically Signed   By: Vanetta Chou M.D.   On: 01/09/2024 21:14           Past Medical History:  Diagnosis Date   ADHD    Allergy    Anemia    IRON TRANSFUSION 07-2020 NONE SINCE   Anxiety    Back pain    COVID 08/12/2019   ALL SYMPTOMS REOLVED PER PT   Depression    Diabetic neuropathy (HCC) 11/11/2020   FEET   dm type 2    sees Dr. Odella Jacobson at [facility]   Fatty liver    GERD (gastroesophageal reflux disease)    History of kidney stones    Joint pain    Menorrhagia 11/11/2020   Migraines    Murmur, cardiac    FAINT NO CARDIOLOGIST   Other fatigue    Pneumonia 08/12/2019   COVID PNEUMONIA ALL SYMPTOMS RESOLVED PER PT   Shortness of breath on exertion     Past Surgical History:  Procedure Laterality Date   CESAREAN SECTION  12/13/2006   COLONOSCOPY WITH PROPOFOL  N/A 08/02/2023   Procedure: COLONOSCOPY WITH  PROPOFOL ;  Surgeon: Legrand Victory LITTIE DOUGLAS, MD;  Location: [facility];  Service: Gastroenterology;  Laterality: N/A;   ESOPHAGOGASTRODUODENOSCOPY (EGD) WITH PROPOFOL  N/A 08/02/2023   Procedure: ESOPHAGOGASTRODUODENOSCOPY (EGD) WITH PROPOFOL ;  Surgeon: Legrand Victory LITTIE DOUGLAS, MD;  Location: [facility];  Service: Gastroenterology;  Laterality: N/A;   EXTRACORPOREAL SHOCK WAVE LITHOTRIPSY  2018   FLEXIBLE SIGMOIDOSCOPY N/A 11/20/2023   Procedure: KINGSTON SIDE;  Surgeon: Albertus Gordy HERO, MD;  Location: [facility];  Service: Gastroenterology;  Laterality: N/A;   FOOT SURGERY Right 10/02/2020   RIGHT FOOT HEEL AND TOES, SURGICAL CENTER OFF [address]   IR PARACENTESIS  07/06/2023   IR PARACENTESIS  08/18/2023   IR PARACENTESIS  10/05/2023   IR PARACENTESIS  10/11/2023   IR PARACENTESIS  10/20/2023   IR PARACENTESIS  11/08/2023   IR PARACENTESIS  11/28/2023   IR PARACENTESIS  12/09/2023   IR PARACENTESIS  12/15/2023   IR PARACENTESIS  12/16/2023   IR PARACENTESIS  12/23/2023   IR PARACENTESIS  12/30/2023   IR PARACENTESIS  01/06/2024   IR PARACENTESIS  01/06/2024   IR TRANSCATHETER BX  07/26/2023   IR US  GUIDE VASC ACCESS RIGHT  07/26/2023   IR VENOGRAM HEPATIC W HEMODYNAMIC EVALUATION  07/26/2023   LAPAROSCOPIC VAGINAL HYSTERECTOMY WITH SALPINGECTOMY Bilateral 11/17/2020   Procedure: LAPAROSCOPIC ASSISTED VAGINAL HYSTERECTOMY WITH BILATERAL SALPINGECTOMY;  Surgeon: Dannielle Bouchard, DO;  Location:  SURGERY CENTER;  Service: Gynecology;  Laterality: Bilateral;   POLYPECTOMY  08/02/2023   Procedure: POLYPECTOMY, INTESTINE;  Surgeon: Legrand Victory LITTIE DOUGLAS, MD;  Location: [facility];  Service: Gastroenterology;;    Family History  Problem Relation Age of Onset   Colon polyps Mother    Depression Mother    Anxiety disorder Mother    Colon polyps Father    Sleep apnea Father    Anxiety disorder Father    Depression Father    Diabetes Father    Bladder Cancer Father 34   Rheum arthritis Father     Migraines Maternal Aunt    Migraines Maternal Grandmother    Diabetes Maternal Grandfather    Healthy Daughter    Asthma Daughter    Anxiety disorder Daughter    Anxiety disorder Son    Asthma Son    Healthy Son    Cerebral aneurysm Cousin    Heart disease Other    Depression Other    Anxiety disorder Other    Sleep apnea Other    Colon cancer Neg Hx    Esophageal cancer Neg Hx    Stomach cancer Neg Hx    Rectal cancer Neg Hx     Prior to Admission medications   Medication Sig Start Date End Date Taking? Authorizing Provider  acetaminophen  (TYLENOL ) 325 MG tablet Take 2 tablets (650 mg total) by mouth every 6 (six) hours as needed for mild pain (pain score 1-3) or fever (or Fever >/= 101). 10/07/23  Yes Sheikh, Omair Latif, DO  albuterol  (VENTOLIN  HFA) 108 [id number] Base) MCG/ACT inhaler INHALE 2 PUFFS INTO THE LUNGS UP TO EVERY 6 HOURS AS NEEDED FOR WHEEZING/SHORTNESS OF BREATH 08/20/22  Yes [name], MD  bisacodyl  (DULCOLAX) 5 MG EC tablet Take 2 tablets (10 mg total) by mouth daily as needed for moderate constipation. 12/19/23  Yes Dahal, Chapman, MD  calcium  carbonate (TUMS - DOSED IN MG ELEMENTAL CALCIUM ) 500 MG chewable tablet Chew 2 tablets by mouth as needed for indigestion or heartburn.   Yes [provider]  ciprofloxacin  (CIPRO ) 500 MG tablet Take 1 tablet (500 mg total) by mouth 2 (two) times daily. 01/03/24  Yes [name], MD  clotrimazole -betamethasone  (LOTRISONE ) cream APPLY 1 APPLICATION TOPICALLY TWICE A DAY AS NEEDED 05/26/23  Yes [name], MD  Continuous Glucose Sensor (DEXCOM G7 SENSOR) MISC Use 1 sensor for continuous glucose monitoring every 10 days for 30 days 05/25/23  Yes Braulio Hough, MD  FLUoxetine  (PROZAC ) 40 MG capsule Take 1 capsule (40 mg total) by mouth 2 (two) times daily. 01/03/24  Yes [name], MD  furosemide  (LASIX ) 80 MG tablet Take 1 tablet (80 mg total) by mouth daily. 10/11/23  Yes Sheikh, Omair Latif, DO  HUMULIN  R  U-500 KWIKPEN  500 UNIT/ML KwikPen Use 120 units at breakfast, 100 at lunch, and 100 at dinner Patient taking differently: Use 60 units at breakfast, 65 at lunch, and 65 at dinner 04/08/22  Yes [name], MD  hydrocortisone  2.5 % cream Use 3 (three) times daily rectally Patient taking differently: Apply 1 Application topically 3 (three) times daily as needed. 11/22/23  Yes Dennise Lavada POUR, MD  HYDROmorphone  (DILAUDID ) 2 MG tablet Take 0.5 tablets (1 mg total) by mouth every 6 (six) hours as needed for severe pain (pain score 7-10). 01/06/24  Yes Garrick Charleston, MD  lactulose  (CHRONULAC ) 10 GM/15ML solution Take 30 mLs (20 g total) by mouth 4 (four) times daily. Patient taking differently: Take 20 g by mouth 3 (three) times daily. 12/19/23 03/18/24 Yes Dahal, Chapman, MD  LORazepam  (ATIVAN ) 0.5 MG tablet Take 0.5 mg by mouth at bedtime.   Yes [provider]  midodrine  (PROAMATINE ) 10 MG tablet Take 1 tablet (10 mg total) by mouth 2 (two) times daily with a meal. 12/19/23 03/18/24 Yes Dahal, Chapman, MD  montelukast  (SINGULAIR ) 10 MG tablet Take 1 tablet (10 mg total) by mouth at bedtime. 08/25/22  Yes [name], MD  omeprazole  (PRILOSEC) 40 MG capsule TAKE 1 CAPSULE (40 MG TOTAL) BY MOUTH IN THE MORNING 10/23/23  Yes Danis, Victory LITTIE MOULD, MD  ondansetron  (ZOFRAN -ODT) 4 MG disintegrating tablet Take 1 tablet (4 mg total) by mouth every 8 (eight) hours as needed for nausea or vomiting. 01/06/24  Yes Garrick Charleston, MD  prochlorperazine  (COMPAZINE ) 10 MG tablet Take 1 tablet (10 mg total) by mouth every 8 (eight) hours as needed for nausea or vomiting. 12/29/23  Yes Danis, Victory LITTIE MOULD, MD  rOPINIRole  (REQUIP ) 1 MG tablet Take 1 tablet (1 mg total) by mouth at bedtime. 01/03/24  Yes [name], MD  spironolactone  (ALDACTONE ) 100 MG tablet Take 3 tablets (300 mg total) by mouth daily. Patient taking differently: Take 300 mg by mouth daily. Taking total (150 mg) 11/01/23 04/11/24 Yes Sira, Zackery, MD  Vitamin  D, Ergocalciferol , (DRISDOL ) 1.25 MG (50000 UNIT) CAPS capsule Take 50,000 Units by mouth every 7 (seven) days. No specific day 11/19/23  Yes [provider]  XIFAXAN  550 MG TABS tablet Take 550 mg by mouth in the morning. 10/27/23  Yes [provider]    Current Facility-Administered Medications  Medication Dose Route Frequency Provider Last Rate Last Admin   albuterol  (PROVENTIL ) (2.5 MG/3ML) 0.083% nebulizer solution 2.5 mg  2.5 mg Inhalation Q6H PRN Danton Reyes DASEN, MD       bisacodyl  (DULCOLAX) EC tablet 10 mg  10 mg Oral Daily PRN Danton Reyes DASEN, MD       calcium  carbonate (TUMS - dosed in mg elemental calcium ) chewable tablet 400 mg of elemental calcium   2 tablet Oral PRN Danton Reyes DASEN, MD       diphenhydrAMINE  (BENADRYL ) injection 25 mg  25 mg Intravenous Q6H PRN Countryman, Chase, MD   25 mg at 01/10/24 0843   FLUoxetine  (PROZAC ) capsule 40 mg  40 mg Oral Daily Danton Reyes T, MD   40 mg at 01/10/24 1028   furosemide  (LASIX ) tablet 80 mg  80 mg Oral Daily Rathore, Vasundhra, MD   80 mg at 01/10/24 1028   HYDROmorphone  (DILAUDID ) tablet 1 mg  1 mg Oral Q6H PRN Rathore, Vasundhra, MD   1 mg at 01/10/24 9156   insulin  aspart (novoLOG ) injection 0-9 Units  0-9 Units Subcutaneous  TID WC Danton Reyes DASEN, MD       insulin  aspart (novoLOG ) injection 10 Units  10 Units Subcutaneous TID WC Danton Reyes DASEN, MD       lactulose  (CHRONULAC ) 10 GM/15ML solution 20 g  20 g Oral TID Alfornia Madison, MD   20 g at 01/10/24 1028   lidocaine  (XYLOCAINE ) 2 % (with pres) injection 200 mg  10 mL Infiltration Once Patt Alm Macho, MD       LORazepam  (ATIVAN ) tablet 0.5 mg  0.5 mg Oral QHS PRN Alfornia Madison, MD       magnesium  sulfate IVPB 1 g 100 mL  1 g Intravenous Once Rathore, Vasundhra, MD       midodrine  (PROAMATINE ) tablet 10 mg  10 mg Oral BID WC Rathore, Vasundhra, MD   10 mg at 01/10/24 0759   montelukast  (SINGULAIR ) tablet 10 mg  10 mg Oral QHS Rathore,  Vasundhra, MD       naloxone (NARCAN) injection 0.4 mg  0.4 mg Intravenous PRN Rathore, Vasundhra, MD       pantoprazole  (PROTONIX ) EC tablet 40 mg  40 mg Oral Daily Rathore, Vasundhra, MD   40 mg at 01/10/24 1028   rifaximin  (XIFAXAN ) tablet 550 mg  550 mg Oral Daily Rathore, Vasundhra, MD   550 mg at 01/10/24 1028   rOPINIRole  (REQUIP ) tablet 1 mg  1 mg Oral QHS Rathore, Vasundhra, MD       spironolactone  (ALDACTONE ) tablet 300 mg  300 mg Oral Daily Rathore, Vasundhra, MD   300 mg at 01/10/24 1028   Current Outpatient Medications  Medication Sig Dispense Refill   acetaminophen  (TYLENOL ) 325 MG tablet Take 2 tablets (650 mg total) by mouth every 6 (six) hours as needed for mild pain (pain score 1-3) or fever (or Fever >/= 101). 20 tablet 0   albuterol  (VENTOLIN  HFA) 108 (90 Base) MCG/ACT inhaler INHALE 2 PUFFS INTO THE LUNGS UP TO EVERY 6 HOURS AS NEEDED FOR WHEEZING/SHORTNESS OF BREATH 18 each 0   bisacodyl  (DULCOLAX) 5 MG EC tablet Take 2 tablets (10 mg total) by mouth daily as needed for moderate constipation. 30 tablet 0   calcium  carbonate (TUMS - DOSED IN MG ELEMENTAL CALCIUM ) 500 MG chewable tablet Chew 2 tablets by mouth as needed for indigestion or heartburn.     ciprofloxacin  (CIPRO ) 500 MG tablet Take 1 tablet (500 mg total) by mouth 2 (two) times daily. 14 tablet 0   clotrimazole -betamethasone  (LOTRISONE ) cream APPLY 1 APPLICATION TOPICALLY TWICE A DAY AS NEEDED 30 g 1   Continuous Glucose Sensor (DEXCOM G7 SENSOR) MISC Use 1 sensor for continuous glucose monitoring every 10 days for 30 days 3 each 5   FLUoxetine  (PROZAC ) 40 MG capsule Take 1 capsule (40 mg total) by mouth 2 (two) times daily. 180 capsule 3   furosemide  (LASIX ) 80 MG tablet Take 1 tablet (80 mg total) by mouth daily. 30 tablet 0   HUMULIN  R U-500 KWIKPEN 500 UNIT/ML KwikPen Use 120 units at breakfast, 100 at lunch, and 100 at dinner (Patient taking differently: Use 60 units at breakfast, 65 at lunch, and 65 at dinner)  1 mL 0   hydrocortisone  2.5 % cream Use 3 (three) times daily rectally (Patient taking differently: Apply 1 Application topically 3 (three) times daily as needed.) 28.35 g 0   HYDROmorphone  (DILAUDID ) 2 MG tablet Take 0.5 tablets (1 mg total) by mouth every 6 (six) hours as needed for severe pain (pain score 7-10). 15 tablet  0   lactulose  (CHRONULAC ) 10 GM/15ML solution Take 30 mLs (20 g total) by mouth 4 (four) times daily. (Patient taking differently: Take 20 g by mouth 3 (three) times daily.) 3784 mL 2   LORazepam  (ATIVAN ) 0.5 MG tablet Take 0.5 mg by mouth at bedtime.     midodrine  (PROAMATINE ) 10 MG tablet Take 1 tablet (10 mg total) by mouth 2 (two) times daily with a meal. 60 tablet 2   montelukast  (SINGULAIR ) 10 MG tablet Take 1 tablet (10 mg total) by mouth at bedtime. 90 tablet 3   omeprazole  (PRILOSEC) 40 MG capsule TAKE 1 CAPSULE (40 MG TOTAL) BY MOUTH IN THE MORNING 90 capsule 0   ondansetron  (ZOFRAN -ODT) 4 MG disintegrating tablet Take 1 tablet (4 mg total) by mouth every 8 (eight) hours as needed for nausea or vomiting. 20 tablet 0   prochlorperazine  (COMPAZINE ) 10 MG tablet Take 1 tablet (10 mg total) by mouth every 8 (eight) hours as needed for nausea or vomiting. 30 tablet 2   rOPINIRole  (REQUIP ) 1 MG tablet Take 1 tablet (1 mg total) by mouth at bedtime. 30 tablet 5   spironolactone  (ALDACTONE ) 100 MG tablet Take 3 tablets (300 mg total) by mouth daily. (Patient taking differently: Take 300 mg by mouth daily. Taking total (150 mg)) 90 tablet 0   Vitamin D , Ergocalciferol , (DRISDOL ) 1.25 MG (50000 UNIT) CAPS capsule Take 50,000 Units by mouth every 7 (seven) days. No specific day     XIFAXAN  550 MG TABS tablet Take 550 mg by mouth in the morning.      Allergies as of 01/09/2024 - Review Complete 01/09/2024  Allergen Reaction Noted   Vancomycin  Itching 10/04/2023   Amoxicillin Hives and Itching 02/16/2011   Azithromycin   07/25/2017   Dulaglutide  Other (See Comments) 08/21/2020    Empagliflozin -metformin  hcl er Other (See Comments) and Swelling 08/21/2020   Januvia  [sitagliptin ] Other (See Comments) 11/18/2014   Latex  07/25/2017   Metformin   12/16/2022   Penicillins Hives 02/16/2011   Vitamin k  and related Other (See Comments) 11/19/2023    Social History   Socioeconomic History   Marital status: Married    Spouse name: Adam   Number of children: 2   Years of education: Not on file   Highest education level: 12th grade  Occupational History   Occupation: Arts administrator and Nutrition    Employer: [employer]    Comment: works 20 hours per week  Tobacco Use   Smoking status: Never    Passive exposure: Current   Smokeless tobacco: Never  Vaping Use   Vaping status: Never Used  Substance and Sexual Activity   Alcohol use: No    Alcohol/week: 0.0 standard drinks of alcohol   Drug use: No   Sexual activity: Yes    Partners: Male    Birth control/protection: None  Other Topics Concern   Not on file  Social History Narrative   Not on file   Social Drivers of Health   Financial Resource Strain: Low Risk  (09/19/2022)   Overall Financial Resource Strain (CARDIA)    Difficulty of Paying Living Expenses: Not hard at all  Food Insecurity: No Food Insecurity (12/15/2023)   Hunger Vital Sign    Worried About Running Out of Food in the Last Year: Never true    Ran Out of Food in the Last Year: Never true  Transportation Needs: No Transportation Needs (12/15/2023)   PRAPARE - Administrator, Civil Service (Medical): No  Lack of Transportation (Non-Medical): No  Physical Activity: Insufficiently Active (06/04/2021)   Exercise Vital Sign    Days of Exercise per Week: 2 days    Minutes of Exercise per Session: 10 min  Stress: Stress Concern Present (09/19/2022)   [employer] of Occupational Health - Occupational Stress Questionnaire    Feeling of Stress : To some extent  Social Connections: Socially Integrated (10/05/2023)   Social  Connection and Isolation Panel    Frequency of Communication with Friends and Family: More than three times a week    Frequency of Social Gatherings with Friends and Family: Twice a week    Attends Religious Services: More than 4 times per year    Active Member of [employer] or Organizations: No    Attends Engineer, structural: More than 4 times per year    Marital Status: Married  Catering manager Violence: Not At Risk (12/15/2023)   Humiliation, Afraid, Rape, and Kick questionnaire    Fear of Current or Ex-Partner: No    Emotionally Abused: No    Physically Abused: No    Sexually Abused: No     Code Status   Code Status: Full Code  Review of Systems: All systems reviewed and negative except where noted in HPI.  Physical Exam: Vital signs in last 24 hours: Temp:  [97.8 F (36.6 C)-98.4 F (36.9 C)] 98.4 F (36.9 C) (09/30 0400) Pulse Rate:  [88-113] 88 (09/30 0845) Resp:  [16-30] 17 (09/30 0845) BP: (101-137)/(47-96) 110/53 (09/30 0900) SpO2:  [98 %-100 %] 99 % (09/30 0845) Weight:  [86.2 kg] 86.2 kg (09/29 2020)    General:  Pleasant be female in NAD.  Up ambulating to bathroom Psych:  Cooperative. Normal mood and affect Eyes: Pupils equal Ears:  Normal auditory acuity Nose: No deformity, discharge or lesions Neck:  Supple, no masses felt Lungs:  Clear to auscultation.  Heart:  Regular rate, regular rhythm.  Abdomen:  Soft, distended, nontender, active bowel sounds, no masses felt Rectal :  Deferred Msk: Symmetrical without gross deformities.  Neurologic:  Alert, oriented, grossly normal neurologically.  No asterixis Extremities : No edema.  Skin:  Intact without significant lesions.    Intake/Output from previous day: No intake/output data recorded. Intake/Output this shift:  No intake/output data recorded.   [name], NP-C   01/10/2024, 11:24 AM   Attending physician's note   I have taken a history, reviewed the chart, and examined the patient. I  performed a substantive portion of this encounter, including complete performance of at least one of the key components, in conjunction with the APP. I agree with the APP's note, impression, and recommendations with my edits.   43 year old female with history of MASH cirrhosis complicated by portal hypertensive gastropathy, esophageal varices, hepatic encephalopathy, ascites, admitted with recurrent large-volume ascites with abdominal distention and discomfort.  Underwent LVP with 7 L removed earlier today with good clinical improvement.  Fluid negative for SBP.  She has otherwise been taking cirrhotic medications as prescribed, to include Lasix , Aldactone , rifaximin , lactulose .  She follows with [facility], and has upcoming appointment in the [place] office later this week that she would very much like to keep pending clinical improvement.  Admission labs reviewed and otherwise largely at baseline with MELD 25.  1) MASH cirrhosis 2) Ascites 3) History of esophageal varices 4) Hepatic encephalopathy - Good response to large-volume paracentesis - Check BMP panel in the morning to ensure no renal injury from large volume shifts -  Resume Lasix  and spironolactone  - Continue low-sodium diet - Continue lactulose , rifaximin  - We did discuss the role/utility of TIPS for refractory ascites.  However, with her history of hepatic encephalopathy, would certainly be cautious about this and those decisions would be made in concert with her Transplant Hepatologist.  5) GERD - Continue Protonix   6) Diabetes - Management per inpatient Hospitalist service  Inpatient GI service will continue to follow.  [address], DO, FACG [phone] office

## 2024-01-10 NOTE — Plan of Care (Signed)

## 2024-01-10 NOTE — ED Notes (Signed)
 Pt is refusing morning labs states they got all her blood last night almost at 23:00.

## 2024-01-11 DIAGNOSIS — K7290 Hepatic failure, unspecified without coma: Secondary | ICD-10-CM | POA: Diagnosis not present

## 2024-01-11 DIAGNOSIS — K7460 Unspecified cirrhosis of liver: Secondary | ICD-10-CM | POA: Diagnosis not present

## 2024-01-11 LAB — CULTURE, BODY FLUID W GRAM STAIN -BOTTLE: Culture: NO GROWTH

## 2024-01-11 LAB — GLUCOSE, CAPILLARY
Glucose-Capillary: 213 mg/dL — ABNORMAL HIGH (ref 70–99)
Glucose-Capillary: 331 mg/dL — ABNORMAL HIGH (ref 70–99)

## 2024-01-11 LAB — CBC
HCT: 26.4 % — ABNORMAL LOW (ref 36.0–46.0)
Hemoglobin: 8.7 g/dL — ABNORMAL LOW (ref 12.0–15.0)
MCH: 29.4 pg (ref 26.0–34.0)
MCHC: 33 g/dL (ref 30.0–36.0)
MCV: 89.2 fL (ref 80.0–100.0)
Platelets: 66 K/uL — ABNORMAL LOW (ref 150–400)
RBC: 2.96 MIL/uL — ABNORMAL LOW (ref 3.87–5.11)
RDW: 17.3 % — ABNORMAL HIGH (ref 11.5–15.5)
WBC: 2.5 K/uL — ABNORMAL LOW (ref 4.0–10.5)
nRBC: 0 % (ref 0.0–0.2)

## 2024-01-11 LAB — BASIC METABOLIC PANEL WITH GFR
Anion gap: 11 (ref 5–15)
BUN: 6 mg/dL (ref 6–20)
CO2: 22 mmol/L (ref 22–32)
Calcium: 7.9 mg/dL — ABNORMAL LOW (ref 8.9–10.3)
Chloride: 96 mmol/L — ABNORMAL LOW (ref 98–111)
Creatinine, Ser: 0.88 mg/dL (ref 0.44–1.00)
GFR, Estimated: >60 mL/min (ref >60)
Glucose, Bld: 266 mg/dL — ABNORMAL HIGH (ref 70–99)
Potassium: 3.9 mmol/L (ref 3.5–5.1)
Sodium: 129 mmol/L — ABNORMAL LOW (ref 135–145)

## 2024-01-11 LAB — CULTURE, BLOOD (ROUTINE X 2)
Culture: NO GROWTH
Culture: NO GROWTH
Special Requests: ADEQUATE
Special Requests: ADEQUATE

## 2024-01-11 LAB — PATHOLOGIST SMEAR REVIEW

## 2024-01-11 LAB — MAGNESIUM: Magnesium: 1.8 mg/dL (ref 1.7–2.4)

## 2024-01-11 MED ORDER — BLOOD GLUCOSE MONITORING SUPPL DEVI: 1.0000 | Freq: Three times a day (TID) | 0 refills | Status: DC

## 2024-01-11 MED ORDER — INSULIN GLARGINE 100 UNIT/ML SOLOSTAR PEN: 9.0000 [IU] | PEN_INJECTOR | Freq: Every day | SUBCUTANEOUS | 0 refills | Status: DC

## 2024-01-11 MED ORDER — BLOOD GLUCOSE TEST VI STRP: 1.0000 | ORAL_STRIP | Freq: Three times a day (TID) | 0 refills | Status: DC

## 2024-01-11 MED ORDER — LANCETS MISC: 1.0000 | Freq: Three times a day (TID) | 0 refills | Status: DC

## 2024-01-11 MED ORDER — INSULIN ASPART 100 UNIT/ML FLEXPEN: 0.0000 [IU] | PEN_INJECTOR | Freq: Three times a day (TID) | SUBCUTANEOUS | 0 refills | Status: DC

## 2024-01-11 MED ORDER — PEN NEEDLES 31G X 5 MM MISC: 1.0000 | Freq: Three times a day (TID) | 0 refills | Status: DC

## 2024-01-11 MED ORDER — LANCET DEVICE MISC: 1.0000 | Freq: Three times a day (TID) | 0 refills | Status: DC

## 2024-01-11 MED ORDER — GVOKE HYPOPEN 2-PACK 1 MG/0.2ML ~~LOC~~ SOAJ: 1.0000 mg | SUBCUTANEOUS | 0 refills | Status: DC | PRN

## 2024-01-11 NOTE — Plan of Care (Signed)

## 2024-01-11 NOTE — Discharge Summary (Signed)
 Physician Discharge Summary  Laurie Sutton FMW:[id number] DOB: [birth year] DOA: 01/09/2024  PCP: [name], MD  Admit date: 01/09/2024 Discharge date: 01/11/2024  Admitted From: Home Disposition: Home  Recommendations for Outpatient Follow-up:  Follow up with PCP in 1 week Follow up with [facility] as scheduled 10/2 Follow-up with endocrinology Dr. Braulio due to hypoglycemic episodes. Dose of insulin  adjusted on discharge to avoid hypoglycemia.   Discharge Condition: Stable CODE STATUS: Full code Diet recommendation:  Diet Orders (From admission, onward)     Start     Ordered   01/11/24 0000  Diet 2 gram sodium Room service appropriate? Yes; Fluid consistency: Thin  Diet effective now       Question Answer Comment  Room service appropriate? Yes   Fluid consistency: Thin      01/10/24 1810   01/11/24 0000  Diet - low sodium heart healthy        01/11/24 1144           Brief/Interim Summary: Laurie Sutton is a 43 year old with a history of [place] cirrhosis followed in the [facility], esophageal varices, portal hypertensive gastropathy, hepatic encephalopathy, ascites, pancytopenia, DM2, prior C. difficile colitis, GERD, and anxiety/depression who presented to the ER 9/29 complaining of 2 days of worsening abdominal distention and shortness of breath.  She has been getting weekly large-volume paracentesis with her last drainage 9/26 producing 9 L of fluid.  She has been admitted twice since early August, once for rectal bleeding and the second time for decompensated cirrhosis with hepatic encephalopathy and ascites.  At the time of this presentation she reported significant difficulty breathing due to rapid reaccumulation of ascites with abdominal distention.  She denied fevers or significant bleeding.  No hematemesis or melena.  She reported strict compliance with all of her home medications.  She had been recently  treated for a UTI in the outpatient setting.  Patient underwent paracentesis 9/30 with 7 L fluid removal.  GI consulted. She is discharged in stable condition and has outpatient follow-up at liver care clinic 10/2.  Discharge Diagnoses:   Principal Problem:   Decompensated hepatic cirrhosis (HCC) Active Problems:   Pancytopenia (HCC)   DM (diabetes mellitus) (HCC)   Liver cirrhosis secondary to NASH (HCC)   Obesity (BMI 30-39.9)   Ascites   Dyspnea   QT prolongation    Discharge Instructions  Discharge Instructions     Call MD for:  difficulty breathing, headache or visual disturbances   Complete by: As directed    Call MD for:  extreme fatigue   Complete by: As directed    Call MD for:  persistant dizziness or light-headedness   Complete by: As directed    Call MD for:  persistant nausea and vomiting   Complete by: As directed    Call MD for:  severe uncontrolled pain   Complete by: As directed    Call MD for:  temperature >100.4   Complete by: As directed    Diet - low sodium heart healthy   Complete by: As directed    Discharge instructions   Complete by: As directed    You were cared for by a hospitalist during your hospital stay. If you have any questions about your discharge medications or the care you received while you were in the hospital after you are discharged, you can call the unit and ask to speak with the hospitalist on call if the hospitalist  that took care of you is not available. Once you are discharged, your primary care physician will handle any further medical issues. Please note that NO REFILLS for any discharge medications will be authorized once you are discharged, as it is imperative that you return to your primary care physician (or establish a relationship with a primary care physician if you do not have one) for your aftercare needs so that they can reassess your need for medications and monitor your lab values.   Increase activity slowly   Complete  by: As directed       Allergies as of 01/11/2024       Reactions   Vancomycin  Itching   Amoxicillin Hives, Itching   Azithromycin     DOES NOT WORK   Dulaglutide  Other (See Comments)   constipation and stomach issues  **Trulicity **   Empagliflozin -metformin  Hcl Er Other (See Comments), Swelling   Januvia  [sitagliptin ] Other (See Comments)   HURTS STOAMCH   Latex    IRRITATES VAGINAL AREA   Metformin     Other Reaction(s): achy all over   Morphine  Itching   Severe    Penicillins Hives   Has patient had a PCN reaction causing immediate rash, facial/tongue/throat swelling, SOB or lightheadedness with hypotension: yes. Rash  Has patient had a PCN reaction causing severe rash involving mucus membranes or skin necrosis: Yes- rash and hives all over body  Has patient had a PCN reaction that required hospitalization No Has patient had a PCN reaction occurring within the last 10 years: Yes  If all of the above answers are NO, then may proceed with Cephalosporin use.   Vitamin K  And Related Other (See Comments)   Electric shock         Medication List     STOP taking these medications    acetaminophen  325 MG tablet Commonly known as: TYLENOL    ciprofloxacin  500 MG tablet Commonly known as: Cipro    HumuLIN  R U-500 KwikPen 500 UNIT/ML KwikPen Generic drug: insulin  regular human CONCENTRATED       TAKE these medications    albuterol  108 (90 Base) MCG/ACT inhaler Commonly known as: VENTOLIN  HFA INHALE 2 PUFFS INTO THE LUNGS UP TO EVERY 6 HOURS AS NEEDED FOR WHEEZING/SHORTNESS OF BREATH   bisacodyl  5 MG EC tablet Generic drug: bisacodyl  Take 2 tablets (10 mg total) by mouth daily as needed for moderate constipation.   Blood Glucose Monitoring Suppl Devi 1 each by Does not apply route 3 (three) times daily. May dispense any manufacturer covered by patient's insurance.   BLOOD GLUCOSE TEST STRIPS Strp 1 each by Does not apply route 3 (three) times daily. Use as  directed to check blood sugar. May dispense any manufacturer covered by patient's insurance and fits patient's device.   calcium  carbonate 500 MG chewable tablet Commonly known as: TUMS - dosed in mg elemental calcium  Chew 2 tablets by mouth as needed for indigestion or heartburn.   clotrimazole -betamethasone  cream Commonly known as: LOTRISONE  APPLY 1 APPLICATION TOPICALLY TWICE A DAY AS NEEDED   Constulose  10 GM/15ML solution Generic drug: lactulose  Take 30 mLs (20 g total) by mouth 4 (four) times daily. What changed: when to take this   Dexcom G7 Sensor Misc Use 1 sensor for continuous glucose monitoring every 10 days for 30 days   FLUoxetine  40 MG capsule Commonly known as: PROZAC  Take 1 capsule (40 mg total) by mouth 2 (two) times daily.   furosemide  80 MG tablet Commonly known as: LASIX  Take 1 tablet (  80 mg total) by mouth daily.   Gvoke HypoPen 2-Pack 1 MG/0.2ML Soaj Generic drug: Glucagon Inject 1 mg into the skin as needed for up to 2 doses (Severe low blood sugar).   hydrocortisone  2.5 % cream Use 3 (three) times daily rectally What changed:  how much to take how to take this when to take this reasons to take this   HYDROmorphone  2 MG tablet Commonly known as: Dilaudid  Take 0.5 tablets (1 mg total) by mouth every 6 (six) hours as needed for severe pain (pain score 7-10).   insulin  aspart 100 UNIT/ML FlexPen Commonly known as: NOVOLOG  Inject 0-6 Units into the skin 3 (three) times daily with meals. Check Blood Glucose (BG) and inject per scale: BG <150= 0 unit; BG 150-200= 1 unit; BG 201-250= 2 unit; BG 251-300= 3 unit; BG 301-350= 4 unit; BG 351-400= 5 unit; BG >400= 6 unit and Call Primary Care.   insulin  glargine 100 UNIT/ML Solostar Pen Commonly known as: LANTUS  Inject 9 Units into the skin daily. May substitute as needed per insurance.   Lancet Device Misc 1 each by Does not apply route 3 (three) times daily. May dispense any manufacturer covered by  patient's insurance.   Lancets Misc 1 each by Does not apply route 3 (three) times daily. Use as directed to check blood sugar. May dispense any manufacturer covered by patient's insurance and fits patient's device.   LORazepam  0.5 MG tablet Commonly known as: ATIVAN  Take 0.5 mg by mouth at bedtime.   midodrine  10 MG tablet Commonly known as: PROAMATINE  Take 1 tablet (10 mg total) by mouth 2 (two) times daily with a meal.   montelukast  10 MG tablet Commonly known as: SINGULAIR  Take 1 tablet (10 mg total) by mouth at bedtime.   omeprazole  40 MG capsule Commonly known as: PRILOSEC TAKE 1 CAPSULE (40 MG TOTAL) BY MOUTH IN THE MORNING   ondansetron  4 MG disintegrating tablet Commonly known as: ZOFRAN -ODT Take 1 tablet (4 mg total) by mouth every 8 (eight) hours as needed for nausea or vomiting.   Pen Needles 31G X 5 MM Misc 1 each by Does not apply route 3 (three) times daily. May dispense any manufacturer covered by patient's insurance.   prochlorperazine  10 MG tablet Commonly known as: COMPAZINE  Take 1 tablet (10 mg total) by mouth every 8 (eight) hours as needed for nausea or vomiting.   rOPINIRole  1 MG tablet Commonly known as: REQUIP  Take 1 tablet (1 mg total) by mouth at bedtime.   spironolactone  100 MG tablet Commonly known as: ALDACTONE  Take 3 tablets (300 mg total) by mouth daily. What changed: additional instructions   Vitamin D  (Ergocalciferol ) 1.25 MG (50000 UNIT) Caps capsule Commonly known as: DRISDOL  Take 50,000 Units by mouth every 7 (seven) days. No specific day   Xifaxan  550 MG Tabs tablet Generic drug: rifaximin  Take 550 mg by mouth in the morning. What changed: when to take this        Allergies  Allergen Reactions   Vancomycin  Itching   Amoxicillin Hives and Itching   Azithromycin      DOES NOT WORK   Dulaglutide  Other (See Comments)    constipation and stomach issues  **Trulicity **    Empagliflozin -Metformin  Hcl Er Other (See Comments)  and Swelling   Januvia  [Sitagliptin ] Other (See Comments)    HURTS STOAMCH   Latex     IRRITATES VAGINAL AREA   Metformin      Other Reaction(s): achy all over   Morphine  Itching  Severe    Penicillins Hives    Has patient had a PCN reaction causing immediate rash, facial/tongue/throat swelling, SOB or lightheadedness with hypotension: yes. Rash  Has patient had a PCN reaction causing severe rash involving mucus membranes or skin necrosis: Yes- rash and hives all over body  Has patient had a PCN reaction that required hospitalization No Has patient had a PCN reaction occurring within the last 10 years: Yes  If all of the above answers are NO, then may proceed with Cephalosporin use.    Vitamin K  And Related Other (See Comments)    Electric shock      Procedures/Studies: IR Paracentesis Result Date: 01/10/2024 INDICATION: 43 year old female with a history of NASH cirrhosis, recurrent ascites. IR is requested for diagnostic and therapeutic paracentesis. EXAM: ULTRASOUND GUIDED DIAGNOSTIC AND THERAPEUTIC PARACENTESIS MEDICATIONS: 6 cc of 1% lidocaine  COMPLICATIONS: None immediate. PROCEDURE: Informed written consent was obtained from the patient after a discussion of the risks, benefits and alternatives to treatment. A timeout was performed prior to the initiation of the procedure. Initial ultrasound scanning demonstrates a large amount of ascites within the right lower abdominal quadrant. The right lower abdomen was prepped and draped in the usual sterile fashion. 1% lidocaine  was used for local anesthesia. Following this, a 19 gauge, 7-cm, Yueh catheter was introduced. An ultrasound image was saved for documentation purposes. The paracentesis was performed. The catheter was removed and a dressing was applied. The patient tolerated the procedure well without immediate post procedural complication. Patient received post-procedure intravenous albumin ; see nursing notes for details. FINDINGS: A  total of approximately 7.0 L of clear, straw-colored peritoneal fluid was removed. Samples were sent to the laboratory as requested by the clinical team. IMPRESSION: Successful ultrasound-guided paracentesis yielding 7.0 L liters of peritoneal fluid. Procedure performed by Carlin Griffon, PA-C Electronically Signed   By: Juliene Balder M.D.   On: 01/10/2024 14:18   DG Chest Portable 1 View Result Date: 01/09/2024 CLINICAL DATA:  Shortness of breath. EXAM: PORTABLE CHEST 1 VIEW COMPARISON:  Chest radiograph dated 12/15/2023 FINDINGS: No focal consolidation, pleural effusion or pneumothorax. The cardiac silhouette is within normal limits. No acute osseous pathology. IMPRESSION: No active disease. Electronically Signed   By: Vanetta Chou M.D.   On: 01/09/2024 21:14   CT ABDOMEN PELVIS W CONTRAST Result Date: 01/06/2024 CLINICAL DATA:  Acute abdominal pain, history of cirrhosis and recent paracentesis EXAM: CT ABDOMEN AND PELVIS WITH CONTRAST TECHNIQUE: Multidetector CT imaging of the abdomen and pelvis was performed using the standard protocol following bolus administration of intravenous contrast. RADIATION DOSE REDUCTION: This exam was performed according to the departmental dose-optimization program which includes automated exposure control, adjustment of the mA and/or kV according to patient size and/or use of iterative reconstruction technique. CONTRAST:  75mL OMNIPAQUE  IOHEXOL  350 MG/ML SOLN COMPARISON:  12/18/2023 FINDINGS: Lower chest: No acute pleural or parenchymal lung disease. Hepatobiliary: Stable cirrhotic morphology of the liver. Calcified gallstones without evidence of acute cholecystitis. No biliary duct dilation. Pancreas: Unremarkable. No pancreatic ductal dilatation or surrounding inflammatory changes. Spleen: Stable splenomegaly consistent with portal venous hypertension. No focal parenchymal splenic abnormality. Adrenals/Urinary Tract: Adrenal glands are unremarkable. Kidneys are normal,  without renal calculi, focal lesion, or hydronephrosis. Bladder is unremarkable. Stomach/Bowel: No bowel obstruction or ileus. Continued mild bowel wall thickening most pronounced within the proximal colon, again most consistent with portal enteropathy and colopathy given cirrhosis and ascites. Normal appendix right lower quadrant. Vascular/Lymphatic: Dilated main portal vein consistent with portal venous hypertension,  stable. Recanalized umbilical vein and upper abdominal varices unchanged. No pathologic adenopathy. Reproductive: Status post hysterectomy. No adnexal masses. Other: Mild to moderate ascites, similar to prior CT. No free intraperitoneal gas. No abdominal wall hernia. Musculoskeletal: No acute or destructive bony abnormalities. Reconstructed images demonstrate no additional findings. IMPRESSION: 1. Stable findings of cirrhosis and portal venous hypertension, manifested by ascites, splenomegaly, and abdominal varices. 2. Continued wall thickening of the small and large bowel most consistent with portal colopathy/enteropathy given cirrhosis and presence of ascites. 3. Cholelithiasis without cholecystitis. Electronically Signed   By: Ozell Daring M.D.   On: 01/06/2024 16:19   IR Paracentesis Result Date: 01/06/2024 INDICATION: 43 year old female with history of NASH cirrhosis, recurrent ascites. Patient underwent paracentesis earlier today. However, presented to ED, and to ED is requesting diagnostic paracentesis. EXAM: ULTRASOUND GUIDED DIAGNOSTIC AND THERAPEUTIC PARACENTESIS MEDICATIONS: 5 cc of 1% lidocaine . COMPLICATIONS: None immediate. PROCEDURE: Informed written consent was obtained from the patient after a discussion of the risks, benefits and alternatives to treatment. A timeout was performed prior to the initiation of the procedure. Initial ultrasound scanning demonstrates a large amount of ascites within the right lower abdominal quadrant. The right lower abdomen was prepped and draped in  the usual sterile fashion. 1% lidocaine  was used for local anesthesia. Following this, a 19 gauge, 7-cm, Yueh catheter was introduced. An ultrasound image was saved for documentation purposes. The paracentesis was performed. The catheter was removed and a dressing was applied. The patient tolerated the procedure well without immediate post procedural complication. FINDINGS: A total of approximately 1.0 L of clear, straw-colored peritoneal fluid was removed. Samples were sent to the laboratory as requested by the clinical team. IMPRESSION: Successful ultrasound-guided paracentesis yielding 1.0 liters of peritoneal fluid. Procedure performed by Carlin Griffon, PA-C Electronically Signed   By: Marcey Moan M.D.   On: 01/06/2024 15:14   IR Paracentesis Result Date: 01/06/2024 INDICATION: 43 year old female with history of NASH cirrhosis, recurrent ascites. IR was requested for therapeutic paracentesis. 8 L maximum. EXAM: ULTRASOUND GUIDED THERAPEUTIC PARACENTESIS MEDICATIONS: 5 cc of 1% lidocaine . COMPLICATIONS: None immediate. PROCEDURE: Informed written consent was obtained from the patient after a discussion of the risks, benefits and alternatives to treatment. A timeout was performed prior to the initiation of the procedure. Initial ultrasound scanning demonstrates a large amount of ascites within the right lower abdominal quadrant. The right lower abdomen was prepped and draped in the usual sterile fashion. 1% lidocaine  was used for local anesthesia. Following this, a 19 gauge, 7-cm, Yueh catheter was introduced. An ultrasound image was saved for documentation purposes. The paracentesis was performed. The catheter was removed and a dressing was applied. The patient tolerated the procedure well without immediate post procedural complication. Patient received post-procedure intravenous albumin ; see nursing notes for details. FINDINGS: A total of approximately 8.0 L of clear, straw-colored peritoneal fluid was  removed. IMPRESSION: Successful ultrasound-guided paracentesis yielding 8.0 liters of peritoneal fluid. Procedure performed by Carlin Griffon, PA-C Electronically Signed   By: Toribio Agreste M.D.   On: 01/06/2024 13:45   IR Paracentesis Result Date: 12/30/2023 INDICATION: 43 year old female with history of NASH cirrhosis, with recurrent ascites. IR was requested for therapeutic paracentesis. 8 L max. EXAM: ULTRASOUND GUIDED THERAPEUTIC PARACENTESIS MEDICATIONS: 8 cc of 1% lidocaine  COMPLICATIONS: None immediate. PROCEDURE: Informed written consent was obtained from the patient after a discussion of the risks, benefits and alternatives to treatment. A timeout was performed prior to the initiation of the procedure. Initial ultrasound scanning demonstrates a large  amount of ascites within the right lower abdominal quadrant. The right lower abdomen was prepped and draped in the usual sterile fashion. 1% lidocaine  was used for local anesthesia. Following this, a 19 gauge, 7-cm, Yueh catheter was introduced. An ultrasound image was saved for documentation purposes. The paracentesis was performed. The catheter was removed and a dressing was applied. The patient tolerated the procedure well without immediate post procedural complication. Patient received post-procedure intravenous albumin ; see nursing notes for details. FINDINGS: A total of approximately 6.0 L of clear, straw-colored peritoneal fluid was removed. IMPRESSION: Successful ultrasound-guided paracentesis yielding 6.0 liters of peritoneal fluid. Procedure performed by Carlin Griffon, PA-C Electronically Signed   By: Cordella Banner   On: 12/30/2023 13:01   IR Paracentesis Result Date: 12/23/2023 INDICATION: 43 year old female with history of cirrhosis, recurrent ascites. EXAM: ULTRASOUND GUIDED THERAPEUTIC PARACENTESIS MEDICATIONS: 10 mL 1% lidocaine  COMPLICATIONS: None immediate. PROCEDURE: Informed written consent was obtained from the patient after a  discussion of the risks, benefits and alternatives to treatment. A timeout was performed prior to the initiation of the procedure. Initial ultrasound scanning demonstrates a large amount of ascites within the left lateral abdominal quadrant. The left lateral abdomen was prepped and draped in the usual sterile fashion. 1% lidocaine  was used for local anesthesia. Following this, a Cook centesis catheter was introduced. An ultrasound image was saved for documentation purposes. The paracentesis was performed. The catheter was removed and a dressing was applied. The patient tolerated the procedure well without immediate post procedural complication. Patient received post-procedure intravenous albumin ; see nursing notes for details. FINDINGS: A total of approximately 5.8 liters of clear, yellow fluid was removed. IMPRESSION: Successful ultrasound-guided paracentesis yielding 5.8 liters of peritoneal fluid. Performed by: Kacie Matthews PA-C Electronically Signed   By: Juliene Balder M.D.   On: 12/23/2023 10:33   CT ABDOMEN PELVIS W CONTRAST Result Date: 12/18/2023 CLINICAL DATA:  Acute abdominal pain, history of liver failure EXAM: CT ABDOMEN AND PELVIS WITH CONTRAST TECHNIQUE: Multidetector CT imaging of the abdomen and pelvis was performed using the standard protocol following bolus administration of intravenous contrast. RADIATION DOSE REDUCTION: This exam was performed according to the departmental dose-optimization program which includes automated exposure control, adjustment of the mA and/or kV according to patient size and/or use of iterative reconstruction technique. CONTRAST:  75mL OMNIPAQUE  IOHEXOL  350 MG/ML SOLN COMPARISON:  12/15/2023 FINDINGS: Lower chest: No acute pleural or parenchymal lung disease. Hepatobiliary: Cirrhotic morphology of the liver again noted unchanged. No biliary duct dilation. Calcified gallstones are again seen without evidence of acute cholecystitis. Pancreas: Unremarkable. No pancreatic  ductal dilatation or surrounding inflammatory changes. Spleen: Stable splenomegaly, measuring 19.0 x 17.7 x 7.7 cm. No focal abnormality. Adrenals/Urinary Tract: Adrenal glands are unremarkable. Kidneys are normal, without renal calculi, focal lesion, or hydronephrosis. Bladder is decompressed, limiting its evaluation. Stomach/Bowel: No bowel obstruction or ileus. Normal appendix right lower quadrant. Mild diffuse colonic wall thickening likely due to portal colopathy given the presence of cirrhosis and marked ascites. Vascular/Lymphatic: Dilated main portal vein consistent with portal venous hypertension. The splenic vein, SMV, and portal vein are patent. Sequela of portal venous hypertension with recanalization of the umbilical vein and numerous gastric and esophageal varices. No pathologic adenopathy. Reproductive: Status post hysterectomy. No adnexal masses. Other: Moderate ascites, slightly decreased since prior study. No free intraperitoneal gas. No abdominal wall hernia. Musculoskeletal: No acute or destructive bony abnormalities. Reconstructed images demonstrate no additional findings. IMPRESSION: 1. Cirrhosis, with portal venous hypertension manifested by moderate  ascites, splenomegaly, and upper abdominal varices. 2. Mild diffuse colonic wall thickening, favor portal colopathy given findings of cirrhosis and ascites. 3. Cholelithiasis without cholecystitis. Electronically Signed   By: Ozell Daring M.D.   On: 12/18/2023 19:45   IR Paracentesis Result Date: 12/16/2023 INDICATION: 43 year old female with abdominal distention, recurrent ascites. History of decompensated MASH cirrhosis with hepatic encephalopathy EXAM: ULTRASOUND GUIDED THERAPEUTIC PARACENTESIS MEDICATIONS: 10 mL 1% lidocaine  COMPLICATIONS: None immediate. PROCEDURE: Informed written consent was obtained from the patient after a discussion of the risks, benefits and alternatives to treatment. A timeout was performed prior to the initiation  of the procedure. Initial ultrasound scanning demonstrates a large amount of ascites within the right lower abdominal quadrant. The right lower abdomen was prepped and draped in the usual sterile fashion. 1% lidocaine  was used for local anesthesia. Following this, a 19 gauge, 7-cm, Yueh catheter was introduced. An ultrasound image was saved for documentation purposes. The paracentesis was performed. The catheter was removed and a dressing was applied. The patient tolerated the procedure well without immediate post procedural complication. Patient received post-procedure intravenous albumin ; see nursing notes for details. FINDINGS: A total of approximately 6.4 L of clear, yellow fluid was removed. Samples were sent to the laboratory as requested by the clinical team. IMPRESSION: Successful ultrasound-guided paracentesis yielding 6.4 L of peritoneal fluid. Performed by: Kacie Matthews PA-C PLAN: The patient is followed at [facility] for her liver care, and was undergoing workup for potential liver transplant. Thom Hall, MD [facility] Radiology Electronically Signed   By: Thom Hall M.D.   On: 12/16/2023 16:14   CT ABDOMEN PELVIS W CONTRAST Addendum Date: 12/16/2023 ADDENDUM #1 ADDENDUM: Correction: There is moderate ascites  similar to the prior exam. ---------------------------------------------------- Electronically signed by: Evalene Coho MD 12/16/2023 04:44 AM EDT RP Workstation: [id number]   Result Date: 12/16/2023 ORIGINAL REPORT EXAM: CT ABDOMEN AND PELVIS WITH CONTRAST 12/15/2023 05:22:57 AM TECHNIQUE: CT of the abdomen and pelvis was performed with the administration of intravenous contrast. Multiplanar reformatted images are provided for review. Automated exposure control, iterative reconstruction, and/or weight-based adjustment of the mA/kV was utilized to reduce the radiation dose to as low as reasonably achievable. COMPARISON: CT of the  abdomen and pelvis dated 11/18/2023. CLINICAL HISTORY: Abdominal pain, acute, nonlocalized; abdominal pain in setting of liver failure patient with ascites. 75cc [id number]; Pt states that she is in liver failure and is on liver transplant list. Pt reports that she had paracentesis done on Friday and hasn't felt well since then. Pt reports increased SOB that began last night. Pt is jaundice and appears tachypnic. FINDINGS: LOWER CHEST: Minimal dependent atelectasis within the lower lobes. LIVER: The liver demonstrates cirrhotic morphology. No lesions are evident. GALLBLADDER AND BILE DUCTS: Multiple stones are seen within the gallbladder. No biliary ductal dilatation. SPLEEN: The spleen is moderately enlarged, measuring approximately 17 cm in length. No focal lesion. PANCREAS: No mass. No ductal dilatation. ADRENAL GLANDS: Normal appearance. No mass. KIDNEYS, URETERS AND BLADDER: No stones in the kidneys or ureters. No hydronephrosis. No perinephric or periureteral stranding. Urinary bladder is unremarkable. GI AND BOWEL: Stomach demonstrates no acute abnormality. There is no bowel obstruction. No bowel wall thickening. PERITONEUM AND RETROPERITONEUM: No ascites. No free air. VASCULATURE: The main portal vein is abnormally dilated, measuring up to 2.3 cm in diameter. LYMPH NODES: No lymphadenopathy. REPRODUCTIVE ORGANS: The patient is status post hysterectomy. BONES AND SOFT TISSUES: No acute osseous abnormality. There is a small  periumbilical hernia. IMPRESSION: 1. Cirrhotic liver morphology with abnormally dilated main portal vein (up to 2.3 cm) and moderately enlarged spleen (approximately 17 cm). 2. Multiple stones in the gallbladder. 3. Small periumbilical hernia. Electronically signed by: Evalene Coho MD 12/15/2023 05:34 AM EDT RP Workstation: [id number]   IR Paracentesis Result Date: 12/15/2023 INDICATION: Hx of decompensated NASH cirrhosis, ascites, hepatic encephalopathy. Newly admitted for recurrent  hepatic encephalopathy and abdominal pain. IR consulted for diagnostic paracentesis. EXAM: ULTRASOUND GUIDED DIAGNOSTIC PARACENTESIS MEDICATIONS: 6 mL 1% lidocaine  COMPLICATIONS: None immediate. PROCEDURE: Informed written consent was obtained from the patient after a discussion of the risks, benefits and alternatives to treatment. A timeout was performed prior to the initiation of the procedure. Initial ultrasound scanning demonstrates a moderate amount of ascites within the right lower abdominal quadrant. The right lower abdomen was prepped and draped in the usual sterile fashion. 1% lidocaine  was used for local anesthesia. Following this, a 19 gauge, 7-cm, Yueh catheter was introduced. An ultrasound image was saved for documentation purposes. The paracentesis was performed. The catheter was removed and a dressing was applied. The patient tolerated the procedure well without immediate post procedural complication. FINDINGS: A total of approximately 150 mL of clear yellow fluid was removed. Samples were sent to the laboratory as requested by the clinical team. IMPRESSION: Successful ultrasound-guided paracentesis yielding 150 mL of peritoneal fluid. Performed by: Wyatt Pommier, PA-C Electronically Signed   By: Ester Sides M.D.   On: 12/15/2023 16:05   DG Chest Portable 1 View Result Date: 12/15/2023 CLINICAL DATA:  Shortness of breath. History of liver failure requiring frequent paracentesis most recently 6 days ago. EXAM: PORTABLE CHEST 1 VIEW COMPARISON:  AP Lat chest 10/10/2023 FINDINGS: The heart size and mediastinal contours are within normal limits. Both lungs are clear with limited view of the bases due to low inspiration. The visualized skeletal structures are unremarkable. IMPRESSION: No active disease. Limited view of the bases due to low inspiration. Electronically Signed   By: Francis Quam M.D.   On: 12/15/2023 04:55       Discharge Exam: Vitals:   01/11/24 0010 01/11/24 0745  BP: 111/61  (!) 104/58  Pulse: 89 97  Resp:    Temp: 97.6 F (36.4 C) 97.9 F (36.6 C)  SpO2: 99% 98%    General: Pt is alert, awake, not in acute distress Cardiovascular: RRR, S1/S2 +, no edema Respiratory: CTA bilaterally, no wheezing, no rhonchi, no respiratory distress, no conversational dyspnea  Abdominal: Soft, NT, ND, bowel sounds + Extremities: no edema, no cyanosis Psych: Normal mood and affect, stable judgement and insight     The results of significant diagnostics from this hospitalization (including imaging, microbiology, ancillary and laboratory) are listed below for reference.     Microbiology: Recent Results (from the past 240 hours)  Culture, blood (routine x 2)     Status: None   Collection Time: 01/06/24 12:46 PM   Specimen: BLOOD  Result Value Ref Range Status   Specimen Description BLOOD SITE NOT SPECIFIED  Final   Special Requests   Final    BOTTLES DRAWN AEROBIC AND ANAEROBIC Blood Culture adequate volume   Culture   Final    NO GROWTH 5 DAYS Performed at [facility], [address], [place], [place] [address]    Report Status 01/11/2024 FINAL  Final  Culture, blood (routine x 2)     Status: None   Collection Time: 01/06/24 12:46 PM   Specimen: BLOOD  Result Value  Ref Range Status   Specimen Description BLOOD SITE NOT SPECIFIED  Final   Special Requests   Final    BOTTLES DRAWN AEROBIC AND ANAEROBIC Blood Culture adequate volume   Culture   Final    NO GROWTH 5 DAYS Performed at [facility], [address], [place], [place] [address]    Report Status 01/11/2024 FINAL  Final  Gram stain     Status: None   Collection Time: 01/06/24  3:02 PM   Specimen: Abdomen; Peritoneal Fluid  Result Value Ref Range Status   Specimen Description PERITONEAL ABDOMEN FLUID  Final   Special Requests NONE  Final   Gram Stain   Final    WBC PRESENT, PREDOMINANTLY MONONUCLEAR NO ORGANISMS SEEN CYTOSPIN SMEAR Performed at [facility], [address], [place], [place] [address]    Report Status 01/06/2024 FINAL  Final  Culture, body fluid w Gram Stain-bottle     Status: None   Collection Time: 01/06/24  3:02 PM   Specimen: Fluid  Result Value Ref Range Status   Specimen Description FLUID PERITONEAL ABDOMEN  Final   Special Requests NONE  Final   Culture   Final    NO GROWTH 5 DAYS Performed at [facility], [address] [place], [place] [address]    Report Status 01/11/2024 FINAL  Final  Gram stain     Status: None   Collection Time: 01/10/24  9:24 AM   Specimen: Abdomen; Peritoneal Fluid  Result Value Ref Range Status   Specimen Description PERITONEAL  Final   Special Requests ABDOMEN  Final   Gram Stain   Final    NO WBC SEEN NO ORGANISMS SEEN Performed at [facility], [address], [place], [place] [address]    Report Status 01/10/2024 FINAL  Final     Labs: BNP (last 3 results) Recent Labs    12/15/23 0354  BNP 15.8   Basic Metabolic Panel: Recent Labs  Lab 01/06/24 1142 01/09/24 2203 01/10/24 1305 01/11/24 0139  NA 128* 127* 131* 129*  K 3.2* 3.2* 3.1* 3.9  CL 98 97* 96* 96*  CO2 20* 21* 23 22  GLUCOSE 251* 399* 192* 266*  BUN 6 5* 6 6  CREATININE 0.69 0.76 0.73 0.88  CALCIUM  8.6* 8.4* 8.6* 7.9*  MG  --  1.7  --  1.8   Liver Function Tests: Recent Labs  Lab 01/06/24 1142 01/09/24 2203 01/10/24 1305  AST 32 34 38  ALT 19 21 22   ALKPHOS 95 98 81  BILITOT 2.7* 2.6* 3.9*  PROT 6.8 6.3* 6.3*  ALBUMIN  3.7 2.9* 3.2*   Recent Labs  Lab 01/06/24 1142  LIPASE 41   Recent Labs  Lab 01/06/24 1245 01/09/24 2203  AMMONIA 60* 36*   CBC: Recent Labs  Lab 01/06/24 1357 01/09/24 2203 01/10/24 1305 01/11/24 0139  WBC 1.8*  1.7* 2.6* 2.2* 2.5*  NEUTROABS 1.2* 1.6*  --   --   HGB 8.0*  8.2* 9.2* 8.8* 8.7*  HCT 25.0*  25.3* 28.7* 26.4* 26.4*  MCV 90.3  90.4 92.3 88.9 89.2  PLT 61*  59* 63* 62* 66*   Cardiac Enzymes: No results for input(s): CKTOTAL, CKMB,  CKMBINDEX, TROPONINI in the last 168 hours. BNP: Invalid input(s): POCBNP CBG: Recent Labs  Lab 01/10/24 0610 01/10/24 0743 01/10/24 1215 01/10/24 1649 01/11/24 0741  GLUCAP 180* 281* 199* 229* 213*   D-Dimer No results for input(s): DDIMER in the  last 72 hours. Hgb A1c No results for input(s): HGBA1C in the last 72 hours. Lipid Profile No results for input(s): CHOL, HDL, LDLCALC, TRIG, CHOLHDL, LDLDIRECT in the last 72 hours. Thyroid  function studies No results for input(s): TSH, T4TOTAL, T3FREE, THYROIDAB in the last 72 hours.  Invalid input(s): FREET3 Anemia work up No results for input(s): VITAMINB12, FOLATE, FERRITIN, TIBC, IRON, RETICCTPCT in the last 72 hours. Urinalysis    Component Value Date/Time   COLORURINE STRAW (A) 01/10/2024 1527   APPEARANCEUR CLEAR 01/10/2024 1527   LABSPEC 1.003 (L) 01/10/2024 1527   PHURINE 6.0 01/10/2024 1527   GLUCOSEU NEGATIVE 01/10/2024 1527   HGBUR NEGATIVE 01/10/2024 1527   BILIRUBINUR NEGATIVE 01/10/2024 1527   BILIRUBINUR neg 01/03/2024 1351   KETONESUR NEGATIVE 01/10/2024 1527   PROTEINUR NEGATIVE 01/10/2024 1527   UROBILINOGEN 1.0 01/03/2024 1351   UROBILINOGEN 1.0 12/21/2014 1020   NITRITE NEGATIVE 01/10/2024 1527   LEUKOCYTESUR NEGATIVE 01/10/2024 1527   Sepsis Labs Recent Labs  Lab 01/06/24 1357 01/09/24 2203 01/10/24 1305 01/11/24 0139  WBC 1.8*  1.7* 2.6* 2.2* 2.5*   Microbiology Recent Results (from the past 240 hours)  Culture, blood (routine x 2)     Status: None   Collection Time: 01/06/24 12:46 PM   Specimen: BLOOD  Result Value Ref Range Status   Specimen Description BLOOD SITE NOT SPECIFIED  Final   Special Requests   Final    BOTTLES DRAWN AEROBIC AND ANAEROBIC Blood Culture adequate volume   Culture   Final    NO GROWTH 5 DAYS Performed at [facility], [address] [place], [place] [address]    Report Status 01/11/2024 FINAL  Final   Culture, blood (routine x 2)     Status: None   Collection Time: 01/06/24 12:46 PM   Specimen: BLOOD  Result Value Ref Range Status   Specimen Description BLOOD SITE NOT SPECIFIED  Final   Special Requests   Final    BOTTLES DRAWN AEROBIC AND ANAEROBIC Blood Culture adequate volume   Culture   Final    NO GROWTH 5 DAYS Performed at [facility], [address] [place], [place] [address]    Report Status 01/11/2024 FINAL  Final  Gram stain     Status: None   Collection Time: 01/06/24  3:02 PM   Specimen: Abdomen; Peritoneal Fluid  Result Value Ref Range Status   Specimen Description PERITONEAL ABDOMEN FLUID  Final   Special Requests NONE  Final   Gram Stain   Final    WBC PRESENT, PREDOMINANTLY MONONUCLEAR NO ORGANISMS SEEN CYTOSPIN SMEAR Performed at [facility], [address] [place], [place] [address]    Report Status 01/06/2024 FINAL  Final  Culture, body fluid w Gram Stain-bottle     Status: None   Collection Time: 01/06/24  3:02 PM   Specimen: Fluid  Result Value Ref Range Status   Specimen Description FLUID PERITONEAL ABDOMEN  Final   Special Requests NONE  Final   Culture   Final    NO GROWTH 5 DAYS Performed at [facility], [address], [place], [place] [address]    Report Status 01/11/2024 FINAL  Final  Gram stain     Status: None   Collection Time: 01/10/24  9:24 AM   Specimen: Abdomen; Peritoneal Fluid  Result Value Ref Range Status   Specimen Description PERITONEAL  Final   Special Requests ABDOMEN  Final   Gram Stain  Final    NO WBC SEEN NO ORGANISMS SEEN Performed at [facility], [address] [place], [place] [address]    Report Status 01/10/2024 FINAL  Final     Patient was seen and examined on the day of discharge and was found to be in stable condition. Time coordinating discharge: 35 minutes including assessment and coordination of care, as well as examination of the patient.   SIGNED:  Delon Hoe,  DO [facility] 01/11/2024, 11:45 AM

## 2024-01-11 NOTE — Inpatient Diabetes Management (Signed)
 Inpatient Diabetes Program Recommendations  AACE/ADA: New Consensus Statement on Inpatient Glycemic Control   Target Ranges:  Prepandial:   less than 140 mg/dL      Peak postprandial:   less than 180 mg/dL (1-2 hours)      Critically ill patients:  140 - 180 mg/dL    Latest Reference Range & Units 01/10/24 06:10 01/10/24 07:43 01/10/24 12:15 01/10/24 16:49 01/11/24 07:41  Glucose-Capillary 70 - 99 mg/dL 819 (H) 718 (H) 800 (H) 229 (H) 213 (H)   Review of Glycemic Control  Diabetes history: DM2 Outpatient Diabetes medications: Humulin  R U500 60 units with breakfast, 65 units with lunch and supper (patient self adjusting dose and sometimes skips doses) Current orders for Inpatient glycemic control: Novolog  10 units TID with meals, Novolog  0-9 units TID with meals, Novolog  0-5 units QHS   Inpatient Diabetes Program Recommendations:     Insulin : Please consider ordering Lantus  9 units Q24H (based on 86.2 kg x 0.1 unit).     Outpatient DM: Patient reports she is self adjusting Humulin  R U500 and having hypoglycemia frequently as an outpatient. May want to consider discharging patient on basal & bolus insulin  regimen similar to what is ordered as inpatient if glucose is better controlled.  Thanks, Earnie Gainer, RN, MSN, CDCES Diabetes Coordinator Inpatient Diabetes Program [phone] (Team Pager from 8am to 5pm)

## 2024-01-12 ENCOUNTER — Telehealth: Payer: Self-pay

## 2024-01-12 NOTE — Transitions of Care (Post Inpatient/ED Visit) (Signed)
 01/12/2024  Name: Laurie Sutton MRN: [id number] DOB: 25-Sep-1980  Today's TOC FU Call Status: Today's TOC FU Call Status:: Successful TOC FU Call Completed TOC FU Call Complete Date: 01/12/24 Patient's Name and Date of Birth confirmed.  Transition Care Management Follow-up Telephone Call Date of Discharge: 01/11/24 Discharge Facility: [facility]) Type of Discharge: Inpatient Admission Primary Inpatient Discharge Diagnosis:: Decompensated hepatic cirrhosis How have you been since you were released from the hospital?: Better Any questions or concerns?: No  Items Reviewed: Did you receive and understand the discharge instructions provided?: Yes Medications obtained,verified, and reconciled?: Yes (Medications Reviewed) Any new allergies since your discharge?: No Dietary orders reviewed?: NA Do you have support at home?: Yes People in Home [RPT]: spouse  Medications Reviewed Today: Medications Reviewed Today     Reviewed by Lang Avelina PARAS, CMA (Certified Medical Assistant) on 01/12/24 at 1441  Med List Status: <None>   Medication Order Taking? Sig Documenting Provider Last Dose Status Informant  albuterol  (VENTOLIN  HFA) 108 (90 Base) MCG/ACT inhaler [id number] No INHALE 2 PUFFS INTO THE LUNGS UP TO EVERY 6 HOURS AS NEEDED FOR WHEEZING/SHORTNESS OF SHERIDA Johnny Garnette DELENA, MD Past Week Active Self, Pharmacy Records           Med Note (WHITE, TONIA S   Tue Jan 10, 2024  2:35 AM)    bisacodyl  (DULCOLAX) 5 MG EC tablet [id number] No Take 2 tablets (10 mg total) by mouth daily as needed for moderate constipation. Arlice Reichert, MD Past Week Active Self, Pharmacy Records  Blood Glucose Monitoring Suppl DEVI [id number]  1 each by Does not apply route 3 (three) times daily. May dispense any manufacturer covered by patient's insurance. Rojelio Nest, DO  Active   calcium  carbonate (TUMS - DOSED IN MG ELEMENTAL CALCIUM ) 500 MG chewable tablet [id number] No Chew 2 tablets by mouth as  needed for indigestion or heartburn. [provider] Past Week Active Self, Pharmacy Records  clotrimazole -betamethasone  (LOTRISONE ) cream [id number] No APPLY 1 APPLICATION TOPICALLY TWICE A DAY AS NEEDED Johnny Garnette DELENA, MD Past Week Active Self, Pharmacy Records  Continuous Glucose Sensor (DEXCOM G7 SENSOR) MISC [id number] No Use 1 sensor for continuous glucose monitoring every 10 days for 30 days Braulio Hough, MD Past Week Active Self, Pharmacy Records  FLUoxetine  (PROZAC ) 40 MG capsule [id number] No Take 1 capsule (40 mg total) by mouth 2 (two) times daily. Johnny Garnette DELENA, MD 01/08/2024 Active Self, Pharmacy Records  furosemide  (LASIX ) 80 MG tablet [id number] No Take 1 tablet (80 mg total) by mouth daily. Sherrill Cable [place], [place] 01/08/2024 Active Self, Pharmacy Records           Med Note (LEE, NICOLE   Thu Dec 15, 2023  5:50 AM)    Glucagon (GVOKE HYPOPEN 2-PACK) 1 MG/0.2ML SOAJ [id number]  Inject 1 mg into the skin as needed for up to 2 doses (Severe low blood sugar). Rojelio Nest, DO  Active   Glucose Blood (BLOOD GLUCOSE TEST STRIPS) STRP [id number]  1 each by Does not apply route 3 (three) times daily. Use as directed to check blood sugar. May dispense any manufacturer covered by patient's insurance and fits patient's device. Rojelio Nest, DO  Active   hydrocortisone  2.5 % cream [id number] No Use 3 (three) times daily rectally  Patient taking differently: Apply 1 Application topically 3 (three) times daily as needed.   Dennise Lavada POUR, MD Past Week Active Self, Pharmacy Records  HYDROmorphone  (DILAUDID ) 2 MG tablet [id number] No Take 0.5  tablets (1 mg total) by mouth every 6 (six) hours as needed for severe pain (pain score 7-10). Garrick Charleston, MD 01/09/2024 Evening Active Self, Pharmacy Records  insulin  aspart (NOVOLOG ) 100 UNIT/ML FlexPen [id number]  Inject 0-6 Units into the skin 3 (three) times daily with meals. Check Blood Glucose (BG) and inject per scale: BG <150= 0 unit; BG  150-200= 1 unit; BG 201-250= 2 unit; BG 251-300= 3 unit; BG 301-350= 4 unit; BG 351-400= 5 unit; BG >400= 6 unit and Call Primary Care. Rojelio Nest, DO  Active   insulin  glargine (LANTUS ) 100 UNIT/ML Solostar Pen [id number]  Inject 9 Units into the skin daily. May substitute as needed per insurance. Rojelio Nest, DO  Active   Insulin  Pen Needle (PEN NEEDLES) 31G X 5 MM MISC [id number]  1 each by Does not apply route 3 (three) times daily. May dispense any manufacturer covered by patient's insurance. Rojelio Nest, DO  Active   lactulose  (CHRONULAC ) 10 GM/15ML solution [id number] No Take 30 mLs (20 g total) by mouth 4 (four) times daily.  Patient taking differently: Take 20 g by mouth 3 (three) times daily.   Arlice Reichert, MD 01/08/2024 Active Self, Pharmacy Records  Lancet Device MISC [id number]  1 each by Does not apply route 3 (three) times daily. May dispense any manufacturer covered by patient's insurance. Rojelio Nest, DO  Active   Lancets MISC [id number]  1 each by Does not apply route 3 (three) times daily. Use as directed to check blood sugar. May dispense any manufacturer covered by patient's insurance and fits patient's device. Rojelio Nest, DO  Active   LORazepam  (ATIVAN ) 0.5 MG tablet [id number] No Take 0.5 mg by mouth at bedtime. [provider] 01/08/2024 Active Self, Pharmacy Records  midodrine  (PROAMATINE ) 10 MG tablet [id number] No Take 1 tablet (10 mg total) by mouth 2 (two) times daily with a meal. Arlice Reichert, MD 01/08/2024 Active Self, Pharmacy Records  montelukast  (SINGULAIR ) 10 MG tablet [id number] No Take 1 tablet (10 mg total) by mouth at bedtime. [name], MD 01/08/2024 Active Self, Pharmacy Records           Med Note LEOBARDO, NICOLE   Thu Dec 15, 2023  5:51 AM)    omeprazole  (PRILOSEC) 40 MG capsule [id number] No TAKE 1 CAPSULE (40 MG TOTAL) BY MOUTH IN THE MORNING Legrand Victory LITTIE DOUGLAS, MD 01/08/2024 Active Self, Pharmacy Records           Med Note (LEE, NICOLE   Thu  Dec 15, 2023  5:51 AM)    ondansetron  (ZOFRAN -ODT) 4 MG disintegrating tablet [id number] No Take 1 tablet (4 mg total) by mouth every 8 (eight) hours as needed for nausea or vomiting. Garrick Charleston, MD Past Week Active Self, Pharmacy Records  prochlorperazine  (COMPAZINE ) 10 MG tablet [id number] No Take 1 tablet (10 mg total) by mouth every 8 (eight) hours as needed for nausea or vomiting. Legrand Victory LITTIE DOUGLAS, MD 01/08/2024 Active Self, Pharmacy Records  rOPINIRole  (REQUIP ) 1 MG tablet [id number] No Take 1 tablet (1 mg total) by mouth at bedtime. [name], MD 01/08/2024 Active Self, Pharmacy Records  spironolactone  (ALDACTONE ) 100 MG tablet [id number] No Take 3 tablets (300 mg total) by mouth daily.  Patient taking differently: Take 300 mg by mouth daily. Taking total (150 mg)   Luz Skelton, MD 01/08/2024 Active Self, Pharmacy Records           Med Note LEOBARDO, NICOLE   Thu Dec 15, 2023  5:51  AM)    Vitamin D , Ergocalciferol , (DRISDOL ) 1.25 MG (50000 UNIT) CAPS capsule [id number] No Take 50,000 Units by mouth every 7 (seven) days. No specific day [provider] 01/04/2024 Active Self, Pharmacy Records  XIFAXAN  550 MG TABS tablet [id number] No Take 550 mg by mouth in the morning. [provider] 01/08/2024 Active Self, Pharmacy Records           Med Note (WHITE, TONIA S   Tue Jan 10, 2024  2:46 AM)              Home Care and Equipment/Supplies: Were Home Health Services Ordered?: NA Any new equipment or medical supplies ordered?: NA  Functional Questionnaire: Do you need assistance with bathing/showering or dressing?: No Do you need assistance with meal preparation?: No Do you need assistance with eating?: No Do you have difficulty maintaining continence: No Do you need assistance with getting out of bed/getting out of a chair/moving?: No Do you have difficulty managing or taking your medications?: No  Follow up appointments reviewed: PCP Follow-up appointment confirmed?:  No (pt declined, will follow up with specialist) Specialist Hospital Follow-up appointment confirmed?: Yes Date of Specialist follow-up appointment?: 01/12/24 Follow-Up Specialty Provider:: Drazek, Stephane LABOR, NP Do you need transportation to your follow-up appointment?: No Do you understand care options if your condition(s) worsen?: Yes-patient verbalized understanding    SIGNATURE Avelina Essex, CMA (AAMA)  [facility] [phone]

## 2024-01-12 NOTE — Telephone Encounter (Signed)
 Please overbook for patient to see me for follow-up visit on 02/10/2024 in the afternoon

## 2024-01-12 NOTE — Progress Notes (Signed)
 [facility] [place]  Patient: Laurie Sutton Age: 43 y.o.        DOB: 01/15/1981  MRN: [id number] Sex: female      DATE: 01/12/2024    Referring Provider/gastroenterology: Victory Brand, MD [facility] [address] [place], [place] [address] phone [phone] fax [phone]   Primary Care Provider:  [name], MD  [address] [place], [place] [address]  [phone] (Work)  [phone] (Fax)   Endocrinology: Odella Jacobson, MD [address] [place], [place] [address]  [phone] (Work) [phone] (Fax)  Neurology: Gayland Lauraine PARAS, NP  [address]  [place], [place] [address]  [phone] (Work)  [phone] (Fax)   DAX Voice AI software was used in the dictation of this note with consent from the patient.  Subjective    Laurie Sutton is a 43 y.o. White or Caucasian female who is accompanied by her husband. Patient serves as her own historian.  Laurie Sutton was last seen by me at [facility] on 10/26/2023 and is here today for follow-up.  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    Patient is blood group O and most recent MELD 3.0 was 24.  Patient is currently undergoing liver transplant evaluation with pending testing/consults including: Cardiac CTA, dental clearance, cardiologist, and transplant surgery.  All testing is currently scheduled  Medical history is also notable for diabetes, diabetic neuropathy, and obesity.  Surgical history notable for cesarean section and laparoscopic total vaginal hysterectomy.  Previous serologic evaluation was notable for ANA 1: 320, ASMA mildly elevated at 23; AMA was negative, patient with immunity to hepatitis A, no evidence of exposure or immunity to hepatitis A, B or C, serum alpha-1 antitrypsin level was within normal limits as  was ceruloplasmin, ferritin has never been elevated in the past.  Patient underwent transjugular liver biopsy in April 2025 reporting cirrhosis of uncertain etiology no diagnostic features of autoimmune hepatitis were present, macrovesicular steatosis was present with no evidence of steatohepatitis.  Patient noted to have fatty liver on ultrasound in 2012.  In 2016 and ultrasound showed hepatosplenomegaly.  In 2022 she had an ultrasound elastography reporting a liver stiffness of 4.9kPa.  CT of the abdomen in 2022 reported a nodular liver and varices.  Hepatic venogram at time of liver biopsy in April 2025 consistent with portal hypertension with an HVPG of 20 mmHg.  Patient underwent echocardiogram in June 2025 with grade 2 diastolic dysfunction with pseudonormalization, moderately dilated left atrium, and no pulmonary hypertension.    She had a colonoscopy in April 2025 with a single polyp consistent with tubular adenoma.  She had flexible sigmoidoscopy in August 2025 due to rectal bleeding and was found to have internal hemorrhoids.  Ultrasound in March 2025 with a 1.1 cm left lobe lesion.  MRI of the abdomen in August 2025 with no focal liver lesion.  Patient has had 6 hospital admissions since June 2025including: 7/18 through 10/31/2023 with colitis.   8/8 through 11/22/2023 with bright red blood per rectum from internal hemorrhoids, ascites, and hypotension, and hypokalemia. 9/4 - 12/19/2023 with shortness of breath and abdominal distention 24 hours after paracentesis. 01/06/2024 seen in the ED with generalized abdominal pain and distention 9/29 - 01/11/2024 with shortness of breath, abdominal pain, abdominal distention.  During this admission a provider in the emergency room suggested they have PleurX  cath placed to drain ascites at home.  History of variceal bleeding:No  Most recent EGD: 08/02/2023 with grade 2 esophageal varices and portal hypertensive gastropathy Previous banding: No   Beta-blocker: Patient is not currently on beta-blockers due to hypotension Melena, hematemesis, hematochezia: Denies  History of ascites: Received first paracentesis 07/06/2023 Paracentesis: Undergoing weekly paracentesis since 10/20/2023 she is required paracentesis more frequently than this over the last month with 16 L being removed between 01/06/2024 and 01/10/2024. History of SBP: No  SBP prophylaxis: No  Diuretics: Furosemide  80 mg daily and spironolactone  150 mg daily Patient complains of increased abdominal girth and abdominal pain  History of encephalopathy: Noted during hospitalization in June 2025 Lactulose : Alternating dose of 30 mg/20 mg /30 mg    Number of BM: 4-5 Rifaximin : Yes Problems with memory/confusion/concentration/focus: yes  Patient hypotensive at today's visit with initial blood pressure measurement of 94/45.  Patient does report feeling dizzy frequently.  She is prescribed midodrine  and is taking 10 mg once daily.  Repeat blood pressure was 100/60 manually.  Blood pressure checked again before leaving the clinic and was 118/61.  Patient complains of cramping in her hands, legs, and abdominal muscle.  Risk factors/pertinent social history: She reports no history of tobacco use. She normally drinks a drink or two occasionally but has not had alcohol in over a year. She has not traveled outside of the [place] , has no [employer], and has never spent more than 3 days in jail. She has never received a blood transfusion. She reports no history of exchanging sex for drugs or money or having sex with anyone who has exchanged sex for drugs or money. She has one professional tattoo placed roughly 2 years ago. She reports no history of injecting or snorting drugs. 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    Patient Active Problem List  Diagnosis  . Diabetes mellitus without complication    (CMD)  . GERD (gastroesophageal reflux disease)  .  Other cirrhosis of liver  . Metabolic dysfunction-associated steatohepatitis (MASH)  . Portal hypertension with esophageal varices    (CMD)  . ADHD  . Anxiety  . Depression  . Diabetic neuropathy    (CMD)  . Other ascites  . Mild protein-calorie malnutrition (CMD)  . Hepatic encephalopathy    (CMD)  . Neoplasm of uncertain behavior of liver    Past Surgical History:  Procedure Laterality Date  . CESAREAN SECTION, UNSPECIFIED  2008   Procedure: CESAREAN SECTION  . FOOT SURGERY Right   . LITHOTRIPSY    . TOTAL VAGINAL HYSTERECTOMY     still has ovaries  . WISDOM TOOTH EXTRACTION      Family History  Problem Relation Name Age of Onset  . COPD Mother    . Depression Mother    . Anxiety disorder Mother    . Thrombocytopenia Mother    . Brain cancer Mother    . Diabetes Father    . Depression Father    . Anxiety disorder Father    . Bladder Cancer Father    . Rheum arthritis Father    . Asthma Daughter    . Anxiety disorder Daughter    . Asthma Son    . Anxiety disorder Son    . Diabetes Maternal Aunt    . Cirrhosis Maternal Aunt    . Diabetes Maternal Grandmother    . Diabetes Maternal Grandfather    . Diabetes Paternal Grandfather    .  Liver cancer Paternal Grandfather      Allergies  Allergen Reactions  . Penicillins Hives and Itching    Has patient had a PCN reaction causing immediate rash, facial/tongue/throat swelling, SOB or lightheadedness with hypotension: yes. Rash , Has patient had a PCN reaction causing severe rash involving mucus membranes or skin necrosis: Yes- rash and hives all over body , Has patient had a PCN reaction that required hospitalization No, Has patient had a PCN reaction occurring within the last 10 years: Yes , If all of the above answers are NO, then may proceed with Cephalosporin use.  . Vancomycin  Itching  . Azithromycin  Other (See Comments)    DOES NOT WORK  . Empagliflozin -Metformin  Swelling  . Latex Other (See Comments)  .  Metformin      Other Reaction(s): achy all over  . Morphine  Itching    Severe  . Semaglutide  Other (See Comments)  . Sitagliptin  Arthralgias and Other (See Comments)    Body aches all day, Other reaction(s): Arthralgia (Joint Pain)  . Dulaglutide  GI Intolerance    constipation and stomach issues  JANUVIA  STOAMCH HURT?    Current Outpatient Medications  Medication Instructions  . blood-glucose meter (Accu-Chek Guide Me Glucose Mtr) misc 1 each  . blood-glucose sensor (FreeStyle Libre 3 Sensor) 1 each  . Dexcom G7 Sensor 1 each  . FLUoxetine  (PROZAC ) 40 mg, Daily  . furosemide  (LASIX ) 80 mg, oral, Daily  . glucose blood (Accu-Chek Guide test strips) test strip 1 each, As needed  . HumuLIN  R U-500, Conc, Kwikpen 500 unit/mL (3 mL) pen pen injection INJECT 85UNITS BEFORE BREAKFAST,65UNITS BEFORE LUNCH,65UNITS BEFORE EVENING MEAL BEFORE MEAL  . insulin  glargine (LANTUS  SOLOSTAR) 9 Units  . lactulose  (CHRONULAC ) 20 g, oral, 3 times daily  . LORazepam  (ATIVAN ) 0.5 mg  . montelukast  sodium (SINGULAIR  ORAL)   . Mounjaro  10 mg, Every 7 days  . omeprazole  (PRILOSEC) 40 mg, Daily  . ondansetron  (ZOFRAN ) 4 mg, Every 8 hours PRN  . ondansetron  (ZOFRAN -ODT) 4 mg disintegrating tablet 1 tablet, Every 8 hours PRN  . prochlorperazine  (COMPAZINE ) 10 mg, Every 8 hours PRN  . rifAXIMin  (XIFAXAN ) 550 mg, oral, 2 times daily  . spironolactone  (ALDACTONE ) 150 mg, oral, Daily    Objective:      Vitals:   01/12/24 1500  BP: (!) 94/45  BP Location: Left arm  Patient Position: Sitting  Pulse: 97  Temp: 98.1 F (36.7 C)  TempSrc: Skin  SpO2: 100%  Weight: 84.8 kg (187 lb)  Height: 1.676 m (5' 6)     Height: 1.676 m (5' 6) (01/12/2024  3:00 PM)  Weight: 84.8 kg (187 lb) (01/12/2024  3:00 PM)  Body mass index is 30.18 kg/m.  Physical Exam Constitutional:      General: She is awake.     Appearance: Normal appearance. She is obese. She is ill-appearing.  Eyes:     General: Scleral  icterus present.     Extraocular Movements: Extraocular movements intact.     Pupils: Pupils are equal, round, and reactive to light.  Cardiovascular:     Rate and Rhythm: Normal rate and regular rhythm.     Heart sounds: Murmur heard.     Systolic murmur is present with a grade of 2/6.  Pulmonary:     Effort: Pulmonary effort is normal.     Breath sounds: Normal breath sounds.  Abdominal:     General: Abdomen is protuberant. There is distension.     Palpations: There is  shifting dullness and fluid wave. There is no hepatomegaly, splenomegaly or mass.     Tenderness: There is generalized abdominal tenderness. There is no guarding. Negative signs include Murphy's sign and psoas sign.     Hernia: A hernia is present. Hernia is present in the umbilical area (Reducible).  Musculoskeletal:        General: Normal range of motion.  Lymphadenopathy:     Cervical: No cervical adenopathy.  Skin:    General: Skin is warm and dry.     Coloration: Skin is jaundiced.  Neurological:     General: No focal deficit present.     Mental Status: She is alert and oriented to person, place, and time.     Motor: Tremor present.     Gait: Gait is intact.  Psychiatric:        Attention and Perception: Attention and perception normal.        Mood and Affect: Mood is depressed. Affect is tearful (At times).        Speech: Speech normal.        Behavior: Behavior normal.        Thought Content: Thought content normal.        Cognition and Memory: Cognition and memory normal.       Results:   SEROLOGY:    BIOPSY RESULTS: 07/26/2023 LIVER, BIOPSY:  - Cirrhosis (stage 4 of 4) of uncertain etiology.  The liver biopsy shows a nodular architecture with areas of fibrotic  parenchymal collapse. Trichrome stain highlights fibrous septa that  divide the parenchyma into small nodules and reticulin stain highlights  the regenerative nodularity. A ductular reaction, mild in degree, is  present at the  periphery of the septa. A moderate mononuclear cell  infiltrate composed principally of lymphocytes is present in the septa  with patchy mostly mild interface hepatitis. Macrovesicular steatosis  affects approximately 10% hepatocytes without evidence of active  steatohepatitis. Megamitochondria, and other inclusions are not seen in  hepatocytes. Iron stain shows no stainable iron. PASD stain is negative  for diastase resistant inclusions in hepatocytes.   The findings are consistent with cirrhosis of uncertain etiology.  Differential diagnosis can include burnt out steatohepatitis, chronic  viral hepatitis and autoimmune hepatitis.  Patient's positive ANA  antibody test may favor autoimmune hepatitis though diagnostic  histologic features are not present. Clinical and serologic correlation  is suggested.    LABS:    IMAGING: 12/04/2023 MRI ABDOMEN WITHOUT AND WITH CONTRAST   TECHNIQUE:  Multiplanar multisequence MR imaging of the abdomen was performed  both before and after the administration of intravenous contrast.   CONTRAST:  7 mL of Vueway .   COMPARISON:  CT scan abdomen and pelvis from 11/18/2023.   FINDINGS:  Lower chest: Unremarkable MR appearance to the lung bases. No  pleural effusion. No pericardial effusion. Normal heart size.   Hepatobiliary: Redemonstration of shrunken liver with extensive  surface irregularity/nodularity, compatible with cirrhosis. No  discrete focal lesion seen. The portal veins and central  intrahepatic veins are patent. No intrahepatic or extrahepatic bile  duct dilatation. No choledocholithiasis. Unremarkable gallbladder.   Pancreas: No mass, inflammatory changes or other parenchymal  abnormality identified. No main pancreatic duct dilation.   Spleen: Enlarged measuring upto 11.0 x 21.6 cm orthogonally on  coronal plane. No focal mass.   Adrenals/Urinary Tract: Unremarkable adrenal glands. No suspicious  renal mass. There is a 7 x 9 mm  simple cyst arising from the left  kidney upper pole. No  hydronephrosis on either side.   Stomach/Bowel: Visualized portions within the abdomen are  unremarkable. No disproportionate dilation of bowel loops.   Vascular/Lymphatic: No pathologically enlarged lymph nodes  identified. No abdominal aortic aneurysm demonstrated. There is  recanalized umbilical vein as well as mild paraesophageal,  perigastric, perisplenic as well as left upper abdomen venous  collaterals, suggesting sequela of portal venous hypertension. There  is large ascites.   Other:  None.   Musculoskeletal: No suspicious bone lesions identified.   IMPRESSION:  *Redemonstration of cirrhotic liver with sequela of portal  hypertension including splenomegaly, venous collaterals and large  ascites. No focal liver lesion seen.  *Otherwise essentially unremarkable exam.   10/04/2023 CT ANGIOGRAPHY CHEST   CT ABDOMEN AND PELVIS WITH CONTRAST   TECHNIQUE: Multidetector CT imaging of the chest was performed using the standard protocol during bolus administration of intravenous contrast. Multiplanar CT image reconstructions and MIPs were obtained to evaluate the vascular anatomy. Multidetector CT imaging of the abdomen and pelvis was performed using the standard protocol during bolus administration of intravenous contrast.   RADIATION DOSE REDUCTION: This exam was performed according to the departmental dose-optimization program which includes automated exposure control, adjustment of the mA and/or kV according to patient size and/or use of iterative reconstruction technique.   CONTRAST:  75mL OMNIPAQUE  IOHEXOL  350 MG/ML SOLN   COMPARISON:  CT abdomen and pelvis 06/06/2023   FINDINGS: CTA CHEST FINDINGS   Cardiovascular: Satisfactory opacification of the pulmonary arteries to the segmental level. No evidence of pulmonary embolism. Heart is borderline enlarged/mildly enlarged. No pericardial effusion.    Mediastinum/Nodes: No enlarged mediastinal, hilar, or axillary lymph nodes. Thyroid  gland, trachea, and esophagus demonstrate no significant findings.   Lungs/Pleura: There is mild atelectasis in the bilateral lower lobes and lingula. There is no pleural effusion or pneumothorax.   Musculoskeletal: No chest wall abnormality. No acute or significant osseous findings.   Review of the MIP images confirms the above findings.   CT ABDOMEN and PELVIS FINDINGS   Hepatobiliary: There is nodular liver contour, unchanged. Gallstones are present. There is no biliary ductal dilatation.   Pancreas: Unremarkable. No pancreatic ductal dilatation or surrounding inflammatory changes.   Spleen: Spleen is enlarged measuring up to 18 cm similar to prior.   Adrenals/Urinary Tract: There are 2 subcentimeter hypodensities in the left kidney which are too small to characterize, likely cysts. Otherwise, the kidneys, adrenal glands and bladder are within normal limits.   Stomach/Bowel: There is diffuse wall thickening of the ascending colon, transverse colon and descending colon. The appendix is within normal limits. No bowel obstruction, pneumatosis or free air visualized. Stomach is within normal limits.   Vascular/Lymphatic: Aorta and IVC are normal in size. Splenic varices are present. No enlarged lymph nodes are identified.   Reproductive: 4.8 cm cyst with hyperdense fluid fluid level noted in the left ovary. Right ovary is within normal limits. The uterus is surgically absent.   Other: There is large volume ascites. There is no focal abdominal wall hernia or free air.   Musculoskeletal: No fracture is seen.   Review of the MIP images confirms the above findings.   IMPRESSION: 1. No evidence for pulmonary embolism. 2. Mild bibasilar atelectasis. 3. Cirrhotic liver with sequela of portal hypertension including splenomegaly, varices and large volume ascites. 4. Diffuse wall thickening  of the ascending, transverse and descending colon worrisome for colitis. 5. 4.8 cm left ovarian hemorrhagic cyst. This can be further evaluated with pelvic ultrasound. 6. Cholelithiasis.  06/06/2023  CT ABDOMEN AND PELVIS WITH CONTRAST   TECHNIQUE: Multidetector CT imaging of the abdomen and pelvis was performed using the standard protocol following bolus administration of intravenous contrast.   RADIATION DOSE REDUCTION: This exam was performed according to the departmental dose-optimization program which includes automated exposure control, adjustment of the mA and/or kV according to patient size and/or use of iterative reconstruction technique.   CONTRAST:  OMNIPAQUE  IOHEXOL  300 MG/ML  SOLN   COMPARISON:  07/22/2021   FINDINGS: Lower Chest: No acute findings.   Hepatobiliary: Cirrhosis again noted. No hepatic masses identified. No evidence of portal or hepatic vein thrombosis. Recanalization of paraumbilical veins again seen, consistent with portal venous hypertension. New moderate ascites since prior study.   Tiny calcified gallstones again seen. No signs of acute cholecystitis or biliary ductal dilatation.   Pancreas:  No mass or inflammatory changes.   Spleen: Stable mild-to-moderate splenomegaly, consistent with portal venous hypertension.   Adrenals/Urinary Tract: No suspicious masses identified. No evidence of ureteral calculi or hydronephrosis.   Stomach/Bowel: No evidence of obstruction, inflammatory process or abnormal fluid collections.   Vascular/Lymphatic: No pathologically enlarged lymph nodes. No acute vascular findings.   Reproductive: Prior hysterectomy noted. Adnexal regions are unremarkable in appearance.   Other:  None.   Musculoskeletal:  No suspicious bone lesions identified.   IMPRESSION: Hepatic cirrhosis. No evidence of hepatic neoplasm.   Moderate ascites.   Findings of portal venous hypertension, with stable splenomegaly.    Cholelithiasis. No radiographic evidence of cholecystitis.  07/11/2023 ABDOMEN ULTRASOUND COMPLETE   COMPARISON:  CT abdomen and pelvis June 06, 2023, limited abdomen ultrasound June 16, 2023   FINDINGS: Gallbladder: 0.4 cm polyp. Gallstones are noted, largest measures 0.4 cm. No wall thickening visualized. No sonographic Murphy sign noted by sonographer.   Common bile duct: Diameter: 3.6 mm   Liver: 1.1 x 0.8 x 1.2 cm hypoechoic mass in the left lobe liver. Nodular contour. Recanalized umbilical vein. Portal vein is patent on color Doppler imaging with normal direction of blood flow towards the liver.   IVC: No abnormality visualized.   Pancreas: Visualized portion unremarkable.   Spleen: Spleen measures 12.8 x 19.7 x 7.8 cm with volume of 1,675.   Right Kidney: Length: 12.9 cm. Echogenicity within normal limits. No mass or hydronephrosis visualized.   Left Kidney: Length: 13.4 cm. Echogenicity within normal limits. No mass or hydronephrosis visualized.There is a 1.6 x 1.1 x 1.1 cm simple cyst in the left kidney no follow-up is recommended.   Abdominal aorta: No aneurysm visualized.   Other findings: None.   IMPRESSION: 1. Cirrhosis of the liver. 2. 1.1 x 0.8 x 1.2 cm hypoechoic mass in the left lobe liver. Recommend further evaluation with MRI of the abdomen with and without contrast. 3. Splenomegaly. 4. Cholelithiasis without sonographic evidence of acute cholecystitis. 5. 0.4 cm gallbladder polyp.  03/29/2022 DUPLEX ULTRASOUND OF LIVER   TECHNIQUE: Color and duplex Doppler ultrasound was performed to evaluate the hepatic in-flow and out-flow vessels.   COMPARISON:  CT abdomen pelvis 07/22/2021   FINDINGS: Liver: Diffuse heterogeneity of the parenchyma. Mild nodularity hepatic contours.   No focal lesion, mass or intrahepatic biliary ductal dilatation.   Gallbladder: Minimal cholelithiasis.  Otherwise unremarkable.   Main Portal Vein size: 1.5  cm   Portal Vein Velocities   Main Prox:  18 cm/sec   Main Dist:  23 cm/sec Right: 20 cm/sec Left: 18 cm/sec   Hepatic Vein Velocities   Right:  46 cm/sec  Middle:  28 cm/sec   Left:  30 cm/sec   IVC: Present and patent with normal respiratory phasicity.   Hepatic Artery Velocity:  54 cm/sec   Splenic Vein Velocity:  80 cm/sec   Spleen: 20 cm x 15 cm x 20 cm with a total volume of 3,836 cm^3 (411 cm^3 is upper limit normal)   Portal Vein Occlusion/Thrombus: No   Splenic Vein Occlusion/Thrombus: No   Ascites: None   Varices: None   IMPRESSION: 1. Cirrhotic liver morphology without focal hepatic lesion. 2. Splenomegaly. 3. Patent portal vein with appropriate direction of flow.    Procedures:  10/05/2023 ECHO 1. Left ventricular ejection fraction, by estimation, is 60 to 65%. The left ventricle has normal function. The left ventricle has no regional wall motion abnormalities. Left ventricular diastolic parameters are consistent with Grade II diastolic dysfunction (pseudonormalization).  2. Right ventricular systolic function is normal. The right ventricular size is normal. There is normal pulmonary artery systolic pressure.  3. Left atrial size was moderately dilated.  4. The mitral valve is normal in structure. Trivial mitral valve regurgitation. No evidence of mitral stenosis.  5. The aortic valve is normal in structure. Aortic valve regurgitation is not visualized. No aortic stenosis is present.  6. The inferior vena cava is dilated in size with <50% respiratory variability, suggesting right atrial pressure of 15 mmHg.   07/26/2023 HEPATIC VENOGRAPHY/ TRANSJUGULAR LIVER BIOPSY  Patent hepatic veins and imaged portions of the IVC. Normal hepatic venogram. Pressure measurements as below.   Acquisition of 4 core needle biopsies anterior to the RIGHT hepatic vein.   Mean Pressures (mmHg):   Right atrium: 10   Wedged hepatic vein: 30   Calculated HV  Portosystemic gradient: 20   IMPRESSION: 1. Calculated hepatic venous pressure gradient (HVPG) of 20 mm Hg, consistent with portal hypertension.   2. Successful transjugular liver biopsy, with 4 cores acquired.   EGD 08/02/2023: - Grade II esophageal varices. - Portal hypertensive gastropathy. - Nodular mucosa in the antrum. - Normal examined duodenum. - No specimens collected.  Colonoscopy 08/02/2023: - Hemorrhoids found on perianal exam. - Congested mucosa in the entire examined colon. (Portal colopathy) - One 4 mm polyp in the transverse colon, removed with a cold snare. Resected and retrieved. - Redundant colon. - Internal hemorrhoids. - The examination was otherwise normal. COLON, TRANSVERSE, POLYPECTOMY:  - Tubular adenoma(s)  - Negative for high-grade dysplasia or malignancy    EGD 06/12/2021: - Small distal esopahgus varices, two trunks without any signs of recent or impending bleeding. - Mild portal gastropathy changes throughout the stomach, predominantly in the proximal stomach. - Nodular mild gastritis in the antrum (perhaps nodular portal gastropathy). Biopsies taken to check for H. pylori. - No gastric varices. - The examination was otherwise normal. Surgical [p], gastric antrum and gastric body:  Fragments of gastric mucosa with minimal vascular ectasia. H. Pylori, intestinal metaplasia, atrophy and dysplasia are not identified.    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  Cirrhosis is complicated by portal hypertension with esophageal varices, portal hypertensive gastropathy, and encephalopathy, and ascites.  Patient is currently undergoing transplant evaluation.  MELD 3.0 = 24 CPT C Blood group: O+  Liver synthetic function is impaired with hyponatremia, hyperbilirubinemia, and hypercoagulability.  Will plan for labs to  reevaluate.  Patient has had further decompensation now requiring frequent  hospitalization and paracentesis.  No evidence of portal vein thrombus on imaging to account for her worsening condition.  She did have colitis in June 2025 which may have been the precipitating factor for her further decline.  Advised patient that it is safe to take acetaminophen  up to 2,000 mg/day for pain.  Reviewed that acetaminophen  is in may OTC and prescription medications and to review all labels.  Advised patient that oral NSAIDS are contraindicated in cirrhosis due to increased risk for renal injury, GI bleeding, and duretic refractory ascites.      Relevant Medications   midodrine  (PROAMATINE ) 10 mg tablet   Other Relevant Orders   CBC without Differential   Prothrombin Time (PT) with INR   Comprehensive Metabolic Panel   Metabolic dysfunction-associated steatohepatitis (MASH)   Patient with evidence of hepatic steatosis on imaging in  dating back to 2012.  MASH risk factors include:  obesity (BMI >25) type 2 diabetes  Patient likely has Metabolic dysfunction-associated steatohepatitis (MASH)   MASH is a diagnosis of exclusion; serologic evaluation for other etiologies of liver disease was notable for a positive ANA and weakly positive ASMA.  Will check IgG and IgM and if elevated would consider liver biopsy to rule out coexisting autoimmune hepatitis.  Patient has decompensated cirrhosis.  I reviewed with the patient the natural history and progression of steatotic liver disease (SLD) including risk for progressive liver fibrosis that can potentially lead to cirrhosis.  We discussed that the primary treatment for steatotic liver disease is to reduce risk factors.  Patient should avoid all alcohol, follow a low-fat/cholesterol/carbohydrate diet, restrict calories, increase exercise, heavy goal of at least a 10% reduction in body weight, maintain good control of blood pressure and blood sugar.  Patient  should undergo careful cardiac evaluation as patients with SLD have a higher incidence of cardiac morbidity and mortality.  There is no contraindication to statin use in patients with chronic liver disease.   Patient has not not a candidate for approved pharmacologic treatments for MASH in the setting of decompensated cirrhosis.  She had been on tirzepatide  for weight loss however this has been discontinued given her further hepatic decompensation.      Relevant Orders   CBC without Differential   Prothrombin Time (PT) with INR   Comprehensive Metabolic Panel   Portal hypertension with esophageal varices    (CMD)   The patient has portal hypertension, secondary to decompensated MASH cirrhosis.     Portal veinous system patent.   Portal hypertension evidenced byascites, hypersplenism, splenomegaly, and esophageal varices and portal hypertensive gastropathy.   Portal Pressure Management: Patient is currently not on beta-blockers due to to hypotension.    Educated patient on signs of decompensation, including GI bleeding, encephalopathy, and worsening ascites.       Relevant Orders   CBC without Differential   Prothrombin Time (PT) with INR   Comprehensive Metabolic Panel   Other ascites   Patient with ascites now requiring weekly paracentesis.  And recently had 16 L of peritoneal fluid drained within 4 days.  She has no history of SBP.  Her abdomen is tender on exam today but she reports it is not worse than it was during her recent hospitalization.  Paracentesis on 01/10/2024 with no evidence of SBP.  Patient is scheduled for repeat paracentesis as an outpatient tomorrow, this is scheduled based on her standing order.  -Standing order in place for therapeutic paracentesis to alleviate symptoms.  Max 8 L, if greater than 4  L removed 12.5 g of 25% IV albumin  for every 2 L total volume removed.  Frequency every 7 days been encouraged patient to go as long between paracentesis as  possible. - 2gm Na restriction - Continue furosemide  80 mg daily. - spironolactone  to 150 mg daily -Increase midodrine  to 10 mg 3 times daily -Labs tomorrow to monitor for renal toxicity and electrolyte imbalance -Reviewed signs and symptoms of SBP and indications to go to the emergency room.  Patient is aware that we would not recommend placement of an indwelling catheter for home drainage of ascites due to increased risk for infection.     Relevant Orders   CBC without Differential   Prothrombin Time (PT) with INR   Comprehensive Metabolic Panel   Hepatic encephalopathy    (CMD)   Patient has moderate hepatic encephalopathy as evidenced by cognitive impairment.   She is very tremulous but has no true asterixis on exam  Continue lactulose  with a goal of 3-4 bowel movements/day.   Continue Xifaxin 550mg  twice daily.    Reviewed the importance of adequate protein intake and clinical nutrition and its effect on encephalopathy.  Reviewed signs/symptoms of worsening encephalopathy and indication to contact our office.  Elevated serum ammonia level is not diagnostic of hepatic encephalopathy and serial measuring has no role in management of encephalopathy.  While elevated serum ammonia levels are prognostic for the future development of encephalopathy in patients without a history of encephalopathy the only benefit would be a low serum ammonia level eating and ruling out encephalopathy.  Laboratory analysis of ammonia is imprecise due to the complexities of ammonia metabolism which results and changes to ammonia level depending upon where the samples collected from, how it is handled, and how it is reported.       Relevant Orders   CBC without Differential   Prothrombin Time (PT) with INR   Comprehensive Metabolic Panel   Hypotension, unspecified   Patient with symptomatic hypotension.  I initially planned to instruct the patient to proceed to the ED at [facility] however her blood pressure  improved while in the office.  I have added midodrine  10 mg 3 times daily for additional blood pressure support.  I supplied her with a home blood pressure cuff and advised her to check her blood pressure twice daily.  If her blood pressure is below 100/60 she should report to the emergency room at [facility].      Relevant Medications   midodrine  (PROAMATINE ) 10 mg tablet   Other Relevant Orders   CBC without Differential   Prothrombin Time (PT) with INR   Comprehensive Metabolic Panel   Muscle cramps   Patient reports muscle cramps impacting her quality of life.  Will check CMP to evaluate for electrolyte imbalance.  Will start baclofen 10 mg nightly, can increase to twice daily if necessary as patient is not currently working.  Cautioned patient about adverse effects including sedation and confusion.  Also recommended patient consider over-the-counter L-carnitine supplement or zinc supplementation both of which have data supporting benefit in cirrhosis related muscle cramps.  Also discussed nonpharmacologic measures such as stretching, teaspoon of yellow mustard, tablespoon of pickle juice, and tonic water.       Relevant Medications   baclofen (LIORESAL) 10 mg tablet   Other Relevant Orders   CBC without Differential   Prothrombin Time (PT) with INR   Comprehensive Metabolic Panel   Lengthy discussion had with patient and her husband that if she requires going to the  emergency room for any reason as long it is safe to do so she should go to the emergency room at [facility] so that transplant evaluation can be completed.  PLAN: Complete liver transplant evaluation Labs tomorrow Continue furosemide  80 mg daily and Spironolactone  150 mg daily Start midodrine  10 mg 3 times daily Advised patient to monitor her blood pressure at home and if it falls below 100/60 she should go to the emergency room at [facility] Standing order for large-volume paracentesis; max 8 L with 12.5 g 25% IV albumin  for every 2 L  removed if more than 4 L removed.  Frequency weekly Improve nutrition, 2 g sodium restriction Baclofen 10 mg up to twice daily as needed for muscle cramps Follow up in 4 weeks  Time Statement: A total of 60 minutes was spent face-to-face with the patient today. More than 50% of this time was devoted to counseling and care coordination regarding the following: Disease education (e.g., cirrhosis, hepatitis, transplant candidacy) Review of laboratory and imaging results Medication management and adherence Discussion of lifestyle modifications (e.g., alcohol abstinence, diet, exercise) Coordination of care with other specialists (e.g., transplant team, social work) Advance care planning and addressing patient/family questions Given the complexity of the patient's condition and the extensive counseling provided, time-based billing is being utilized for this encounter.  Stephane Quest, MSN, NP, A-GPCNP-BC, AF-AASLD Nurse Practitioner  [facility] [place] Office: [phone]  Fax: [phone] [facility]  [facility]  Mailing: [address], [address], [place], [place] [address]

## 2024-01-13 ENCOUNTER — Emergency Department (HOSPITAL_COMMUNITY)

## 2024-01-13 ENCOUNTER — Emergency Department (HOSPITAL_COMMUNITY)
Admission: RE | Admit: 2024-01-13 | Discharge: 2024-01-14 | Disposition: A | Discharge status: Other Acute Inpatient Hospital | Source: Ambulatory Visit | Attending: Nurse Practitioner | Admitting: Nurse Practitioner

## 2024-01-13 ENCOUNTER — Other Ambulatory Visit: Payer: Self-pay

## 2024-01-13 VITALS — BP 115/65 | HR 103 | Temp 98.5°F | Resp 20 | Ht 66.0 in | Wt 190.0 lb

## 2024-01-13 DIAGNOSIS — Z794 Long term (current) use of insulin: Secondary | ICD-10-CM | POA: Diagnosis not present

## 2024-01-13 DIAGNOSIS — K7290 Hepatic failure, unspecified without coma: Secondary | ICD-10-CM | POA: Insufficient documentation

## 2024-01-13 DIAGNOSIS — D72819 Decreased white blood cell count, unspecified: Secondary | ICD-10-CM | POA: Insufficient documentation

## 2024-01-13 DIAGNOSIS — R109 Unspecified abdominal pain: Secondary | ICD-10-CM | POA: Diagnosis present

## 2024-01-13 DIAGNOSIS — R188 Other ascites: Secondary | ICD-10-CM

## 2024-01-13 DIAGNOSIS — K7682 Hepatic encephalopathy: Secondary | ICD-10-CM | POA: Insufficient documentation

## 2024-01-13 HISTORY — PX: IR PARACENTESIS: IMG2679

## 2024-01-13 LAB — CBC WITH DIFFERENTIAL/PLATELET
Abs Immature Granulocytes: 0.01 K/uL (ref 0.00–0.07)
Basophils Absolute: 0 K/uL (ref 0.0–0.1)
Basophils Relative: 1 %
Eosinophils Absolute: 0.1 K/uL (ref 0.0–0.5)
Eosinophils Relative: 3 %
HCT: 23.1 % — ABNORMAL LOW (ref 36.0–46.0)
Hemoglobin: 7.5 g/dL — ABNORMAL LOW (ref 12.0–15.0)
Immature Granulocytes: 1 %
Lymphocytes Relative: 19 %
Lymphs Abs: 0.4 K/uL — ABNORMAL LOW (ref 0.7–4.0)
MCH: 29.3 pg (ref 26.0–34.0)
MCHC: 32.5 g/dL (ref 30.0–36.0)
MCV: 90.2 fL (ref 80.0–100.0)
Monocytes Absolute: 0.4 K/uL (ref 0.1–1.0)
Monocytes Relative: 17 %
Neutro Abs: 1.3 K/uL — ABNORMAL LOW (ref 1.7–7.7)
Neutrophils Relative %: 59 %
Platelets: 54 K/uL — ABNORMAL LOW (ref 150–400)
RBC: 2.56 MIL/uL — ABNORMAL LOW (ref 3.87–5.11)
RDW: 17.6 % — ABNORMAL HIGH (ref 11.5–15.5)
WBC: 2.1 K/uL — ABNORMAL LOW (ref 4.0–10.5)
nRBC: 0 % (ref 0.0–0.2)

## 2024-01-13 LAB — COMPREHENSIVE METABOLIC PANEL WITH GFR
ALT: 21 U/L (ref 0–44)
AST: 33 U/L (ref 15–41)
Albumin: 3.1 g/dL — ABNORMAL LOW (ref 3.5–5.0)
Alkaline Phosphatase: 77 U/L (ref 38–126)
Anion gap: 10 (ref 5–15)
BUN: 10 mg/dL (ref 6–20)
CO2: 22 mmol/L (ref 22–32)
Calcium: 8.1 mg/dL — ABNORMAL LOW (ref 8.9–10.3)
Chloride: 98 mmol/L (ref 98–111)
Creatinine, Ser: 0.74 mg/dL (ref 0.44–1.00)
GFR, Estimated: >60 mL/min (ref >60)
Glucose, Bld: 210 mg/dL — ABNORMAL HIGH (ref 70–99)
Potassium: 3.6 mmol/L (ref 3.5–5.1)
Sodium: 130 mmol/L — ABNORMAL LOW (ref 135–145)
Total Bilirubin: 3.6 mg/dL — ABNORMAL HIGH (ref 0.0–1.2)
Total Protein: 6.3 g/dL — ABNORMAL LOW (ref 6.5–8.1)

## 2024-01-13 LAB — LACTIC ACID, PLASMA: Lactic Acid, Venous: 1.2 mmol/L (ref 0.5–1.9)

## 2024-01-13 LAB — I-STAT VENOUS BLOOD GAS, ED
Acid-Base Excess: 2 mmol/L (ref 0.0–2.0)
Bicarbonate: 23.2 mmol/L (ref 20.0–28.0)
Calcium, Ion: 1.08 mmol/L — ABNORMAL LOW (ref 1.15–1.40)
HCT: 24 % — ABNORMAL LOW (ref 36.0–46.0)
Hemoglobin: 8.2 g/dL — ABNORMAL LOW (ref 12.0–15.0)
O2 Saturation: 100 %
Potassium: 3.7 mmol/L (ref 3.5–5.1)
Sodium: 133 mmol/L — ABNORMAL LOW (ref 135–145)
TCO2: 24 mmol/L (ref 22–32)
pCO2, Ven: 23.9 mmHg — ABNORMAL LOW (ref 44–60)
pH, Ven: 7.596 — ABNORMAL HIGH (ref 7.25–7.43)
pO2, Ven: 189 mmHg — ABNORMAL HIGH (ref 32–45)

## 2024-01-13 LAB — TYPE AND SCREEN
ABO/RH(D): O POS
Antibody Screen: NEGATIVE

## 2024-01-13 LAB — URINALYSIS, ROUTINE W REFLEX MICROSCOPIC
Bilirubin Urine: NEGATIVE
Glucose, UA: NEGATIVE mg/dL
Hgb urine dipstick: NEGATIVE
Ketones, ur: NEGATIVE mg/dL
Leukocytes,Ua: NEGATIVE
Nitrite: NEGATIVE
Protein, ur: NEGATIVE mg/dL
Specific Gravity, Urine: 1.008 (ref 1.005–1.030)
pH: 6 (ref 5.0–8.0)

## 2024-01-13 LAB — PROTIME-INR
INR: 1.9 — ABNORMAL HIGH (ref 0.8–1.2)
Prothrombin Time: 22.3 s — ABNORMAL HIGH (ref 11.4–15.2)

## 2024-01-13 LAB — AMMONIA: Ammonia: 70 umol/L — ABNORMAL HIGH (ref 9–35)

## 2024-01-13 LAB — GLUCOSE, CAPILLARY: Glucose-Capillary: 228 mg/dL — ABNORMAL HIGH (ref 70–99)

## 2024-01-13 LAB — CBG MONITORING, ED: Glucose-Capillary: 360 mg/dL — ABNORMAL HIGH (ref 70–99)

## 2024-01-13 LAB — ETHANOL: Alcohol, Ethyl (B): <15 mg/dL (ref <15)

## 2024-01-13 MED ORDER — FLUOXETINE HCL 20 MG PO CAPS
40.0000 mg | ORAL_CAPSULE | Freq: Two times a day (BID) | ORAL | Status: DC
Start: 2024-01-13 — End: 2024-01-14
  Administered 2024-01-13 – 2024-01-14 (×2): 40 mg via ORAL
  Filled 2024-01-13 (×2): qty 2

## 2024-01-13 MED ORDER — HYDROMORPHONE HCL 1 MG/ML IJ SOLN
1.0000 mg | Freq: Once | INTRAMUSCULAR | Status: AC
  Administered 2024-01-13: 1 mg via INTRAVENOUS
  Filled 2024-01-13: qty 1

## 2024-01-13 MED ORDER — LACTULOSE 10 GM/15ML PO SOLN
20.0000 g | Freq: Three times a day (TID) | ORAL | Status: DC
  Administered 2024-01-13 – 2024-01-14 (×2): 20 g via ORAL
  Filled 2024-01-13 (×2): qty 30

## 2024-01-13 MED ORDER — MIDODRINE HCL 5 MG PO TABS
10.0000 mg | ORAL_TABLET | Freq: Two times a day (BID) | ORAL | Status: DC
  Administered 2024-01-13 – 2024-01-14 (×2): 10 mg via ORAL
  Filled 2024-01-13 (×2): qty 2

## 2024-01-13 MED ORDER — DIPHENHYDRAMINE HCL 25 MG PO CAPS
50.0000 mg | ORAL_CAPSULE | Freq: Once | ORAL | Status: AC
  Administered 2024-01-13: 50 mg via ORAL
  Filled 2024-01-13: qty 2

## 2024-01-13 MED ORDER — INSULIN ASPART 100 UNIT/ML IJ SOLN
4.0000 [IU] | Freq: Once | INTRAMUSCULAR | Status: AC
  Administered 2024-01-13: 4 [IU] via SUBCUTANEOUS

## 2024-01-13 MED ORDER — FENTANYL CITRATE PF 50 MCG/ML IJ SOSY
50.0000 ug | PREFILLED_SYRINGE | Freq: Once | INTRAMUSCULAR | Status: AC
  Administered 2024-01-13: 50 ug via INTRAVENOUS
  Filled 2024-01-13: qty 1

## 2024-01-13 MED ORDER — LACTULOSE 10 GM/15ML PO SOLN
20.0000 g | Freq: Once | ORAL | Status: AC
  Administered 2024-01-13: 20 g via ORAL
  Filled 2024-01-13: qty 30

## 2024-01-13 MED ORDER — LORAZEPAM 1 MG PO TABS
0.5000 mg | ORAL_TABLET | Freq: Every day | ORAL | Status: DC
  Administered 2024-01-13: 0.5 mg via ORAL
  Filled 2024-01-13: qty 1

## 2024-01-13 MED ORDER — LIDOCAINE-EPINEPHRINE 1 %-1:100000 IJ SOLN: 20.0000 mL | Freq: Once | INTRAMUSCULAR | Status: DC

## 2024-01-13 MED ORDER — SODIUM CHLORIDE 0.9 % IV BOLUS
1000.0000 mL | Freq: Once | INTRAVENOUS | Status: AC
  Administered 2024-01-13: 1000 mL via INTRAVENOUS

## 2024-01-13 MED ORDER — ALBUMIN HUMAN 25 % IV SOLN
37.5000 g | Freq: Once | INTRAVENOUS | Status: AC
Start: 2024-01-13 — End: 2024-01-13
  Administered 2024-01-13: 37.5 g via INTRAVENOUS

## 2024-01-13 MED ORDER — ALBUMIN HUMAN 25 % IV SOLN
INTRAVENOUS | Status: AC
  Filled 2024-01-13: qty 150

## 2024-01-13 MED ORDER — LIDOCAINE-EPINEPHRINE 1 %-1:100000 IJ SOLN
INTRAMUSCULAR | Status: AC
  Filled 2024-01-13: qty 1

## 2024-01-13 MED ORDER — BISACODYL 5 MG PO TBEC
10.0000 mg | DELAYED_RELEASE_TABLET | Freq: Every day | ORAL | Status: DC | PRN
Start: 2024-01-13 — End: 2024-01-14

## 2024-01-13 MED ORDER — FUROSEMIDE 20 MG PO TABS
80.0000 mg | ORAL_TABLET | Freq: Every day | ORAL | Status: DC
  Administered 2024-01-13 – 2024-01-14 (×2): 80 mg via ORAL
  Filled 2024-01-13 (×2): qty 4

## 2024-01-13 MED ORDER — SODIUM CHLORIDE 0.9 % IV SOLN
1.0000 g | Freq: Once | INTRAVENOUS | Status: AC
  Administered 2024-01-13: 1 g via INTRAVENOUS
  Filled 2024-01-13: qty 10

## 2024-01-13 MED ORDER — DIPHENHYDRAMINE HCL 50 MG/ML IJ SOLN
25.0000 mg | Freq: Once | INTRAMUSCULAR | Status: AC
  Administered 2024-01-13: 25 mg via INTRAVENOUS
  Filled 2024-01-13: qty 1

## 2024-01-13 MED ORDER — ROPINIROLE HCL 1 MG PO TABS
1.0000 mg | ORAL_TABLET | Freq: Every day | ORAL | Status: DC
Start: 2024-01-13 — End: 2024-01-14
  Administered 2024-01-13: 1 mg via ORAL
  Filled 2024-01-13: qty 1

## 2024-01-13 NOTE — ED Notes (Signed)
 [facility]  transfer line stated pt has a bed, transfer line stated will call when bed is ready

## 2024-01-13 NOTE — ED Notes (Signed)
 Lab contacted for pending results. Lab reports technical difficulty but specimens are in hand and will be run asap. Provider made aware.

## 2024-01-13 NOTE — Telephone Encounter (Signed)
 The patient's husband called our office. Reports his wife is at [facility] having a paracentesis and they are having a hard time keeping her awake. Says her BP was 105/45 when she left home and is now at 107/55.   The patient has been moved to the ED at [facility]. Dawn has been notified and would like patient transferred to [place]. The patient's husband and sister are both aware and making this request.

## 2024-01-13 NOTE — Telephone Encounter (Signed)
 Patient scheduled for 02/10/2024 at 12:00 PM.

## 2024-01-13 NOTE — ED Provider Notes (Signed)
 [place] EMERGENCY DEPARTMENT AT [facility] Provider Note   CSN: [id number] Arrival date & time: 01/13/24  1203     Patient presents with: Fatigue   Laurie Sutton is a 43 y.o. female who presents to the ED today having come from paracentesis secondary to history of chronic liver failure/cirrhosis secondary to [facility], brought to the ED by the IR team secondary to profound lethargy after the procedure, had been recently admitted and discharged on 1 October for hepatic encephalopathy, concerns for repeat of this today.  Per note from the IR team, goal of treatment today is for patient to be transferred to [facility] in [place] ultimately for liver transplant.  Transplant team originally intended for this patient to be admitted on 2 October however patient deferred as she did not want to go over the weekend.   HPI     Prior to Admission medications   Medication Sig Start Date End Date Taking? Authorizing Provider  albuterol  (VENTOLIN  HFA) 108 (90 Base) MCG/ACT inhaler INHALE 2 PUFFS INTO THE LUNGS UP TO EVERY 6 HOURS AS NEEDED FOR WHEEZING/SHORTNESS OF BREATH 08/20/22   [name], MD  bisacodyl  (DULCOLAX) 5 MG EC tablet Take 2 tablets (10 mg total) by mouth daily as needed for moderate constipation. 12/19/23   Arlice Reichert, MD  Blood Glucose Monitoring Suppl DEVI 1 each by Does not apply route 3 (three) times daily. May dispense any manufacturer covered by patient's insurance. 01/11/24   Rojelio Nest, DO  calcium  carbonate (TUMS - DOSED IN MG ELEMENTAL CALCIUM ) 500 MG chewable tablet Chew 2 tablets by mouth as needed for indigestion or heartburn.    [provider]  clotrimazole -betamethasone  (LOTRISONE ) cream APPLY 1 APPLICATION TOPICALLY TWICE A DAY AS NEEDED 05/26/23   [name], MD  Continuous Glucose Sensor (DEXCOM G7 SENSOR) MISC Use 1 sensor for continuous glucose monitoring every 10 days for 30 days 05/25/23   Braulio Hough, MD  FLUoxetine  (PROZAC ) 40  MG capsule Take 1 capsule (40 mg total) by mouth 2 (two) times daily. 01/03/24   [name], MD  furosemide  (LASIX ) 80 MG tablet Take 1 tablet (80 mg total) by mouth daily. 10/11/23   Sheikh, Omair Latif, DO  Glucagon (GVOKE HYPOPEN 2-PACK) 1 MG/0.2ML SOAJ Inject 1 mg into the skin as needed for up to 2 doses (Severe low blood sugar). 01/11/24   Rojelio Nest, DO  Glucose Blood (BLOOD GLUCOSE TEST STRIPS) STRP 1 each by Does not apply route 3 (three) times daily. Use as directed to check blood sugar. May dispense any manufacturer covered by patient's insurance and fits patient's device. 01/11/24   Rojelio Nest, DO  hydrocortisone  2.5 % cream Use 3 (three) times daily rectally Patient taking differently: Apply 1 Application topically 3 (three) times daily as needed. 11/22/23   Singh, Prashant K, MD  HYDROmorphone  (DILAUDID ) 2 MG tablet Take 0.5 tablets (1 mg total) by mouth every 6 (six) hours as needed for severe pain (pain score 7-10). 01/06/24   Garrick Charleston, MD  insulin  aspart (NOVOLOG ) 100 UNIT/ML FlexPen Inject 0-6 Units into the skin 3 (three) times daily with meals. Check Blood Glucose (BG) and inject per scale: BG <150= 0 unit; BG 150-200= 1 unit; BG 201-250= 2 unit; BG 251-300= 3 unit; BG 301-350= 4 unit; BG 351-400= 5 unit; BG >400= 6 unit and Call Primary Care. 01/11/24   Rojelio Nest, DO  insulin  glargine (LANTUS ) 100 UNIT/ML Solostar Pen Inject 9 Units into the  skin daily. May substitute as needed per insurance. 01/11/24   Rojelio Nest, DO  Insulin  Pen Needle (PEN NEEDLES) 31G X 5 MM MISC 1 each by Does not apply route 3 (three) times daily. May dispense any manufacturer covered by patient's insurance. 01/11/24   Rojelio Nest, DO  lactulose  (CHRONULAC ) 10 GM/15ML solution Take 30 mLs (20 g total) by mouth 4 (four) times daily. Patient taking differently: Take 20 g by mouth 3 (three) times daily. 12/19/23 03/18/24  Arlice Reichert, MD  Lancet Device MISC 1 each by Does not apply route 3  (three) times daily. May dispense any manufacturer covered by patient's insurance. 01/11/24   Rojelio Nest, DO  Lancets MISC 1 each by Does not apply route 3 (three) times daily. Use as directed to check blood sugar. May dispense any manufacturer covered by patient's insurance and fits patient's device. 01/11/24   Rojelio Nest, DO  LORazepam  (ATIVAN ) 0.5 MG tablet Take 0.5 mg by mouth at bedtime.    [provider]  midodrine  (PROAMATINE ) 10 MG tablet Take 1 tablet (10 mg total) by mouth 2 (two) times daily with a meal. 12/19/23 03/18/24  Dahal, Reichert, MD  montelukast  (SINGULAIR ) 10 MG tablet Take 1 tablet (10 mg total) by mouth at bedtime. 08/25/22   [name], MD  omeprazole  (PRILOSEC) 40 MG capsule TAKE 1 CAPSULE (40 MG TOTAL) BY MOUTH IN THE MORNING 10/23/23   Legrand Victory LITTIE DOUGLAS, MD  ondansetron  (ZOFRAN -ODT) 4 MG disintegrating tablet Take 1 tablet (4 mg total) by mouth every 8 (eight) hours as needed for nausea or vomiting. 01/06/24   Garrick Charleston, MD  prochlorperazine  (COMPAZINE ) 10 MG tablet Take 1 tablet (10 mg total) by mouth every 8 (eight) hours as needed for nausea or vomiting. 12/29/23   Legrand Victory LITTIE DOUGLAS, MD  rOPINIRole  (REQUIP ) 1 MG tablet Take 1 tablet (1 mg total) by mouth at bedtime. 01/03/24   [name], MD  spironolactone  (ALDACTONE ) 100 MG tablet Take 3 tablets (300 mg total) by mouth daily. Patient taking differently: Take 300 mg by mouth daily. Taking total (150 mg) 11/01/23 04/11/24  Sira, Zackery, MD  Vitamin D , Ergocalciferol , (DRISDOL ) 1.25 MG (50000 UNIT) CAPS capsule Take 50,000 Units by mouth every 7 (seven) days. No specific day 11/19/23   [provider]  XIFAXAN  550 MG TABS tablet Take 550 mg by mouth in the morning. 10/27/23   [provider]    Allergies: Vancomycin , Amoxicillin, Azithromycin , Dulaglutide , Empagliflozin -metformin  hcl er, Januvia  [sitagliptin ], Latex, Metformin , Morphine , Penicillins, and Vitamin k  and related     Review of Systems  Constitutional:  Positive for activity change and fatigue.  Neurological:  Positive for weakness.  All other systems reviewed and are negative.   Updated Vital Signs BP 94/79   Pulse (!) 104   Temp 97.8 F (36.6 C) (Oral)   Resp 20   Ht 5' 6 (1.676 m)   Wt 86.2 kg   LMP 11/13/2020 (Exact Date)   SpO2 96%   BMI 30.67 kg/m   Physical Exam Vitals and nursing note reviewed.  Constitutional:      General: She is not in acute distress.    Appearance: Normal appearance.  HENT:     Head: Normocephalic and atraumatic.     Mouth/Throat:     Mouth: Mucous membranes are moist.     Pharynx: Oropharynx is clear.  Eyes:     Extraocular Movements: Extraocular movements intact.     Conjunctiva/sclera: Conjunctivae normal.  Pupils: Pupils are equal, round, and reactive to light.  Cardiovascular:     Rate and Rhythm: Regular rhythm. Tachycardia present.     Pulses: Normal pulses.     Heart sounds: Normal heart sounds. No murmur heard.    No friction rub. No gallop.  Pulmonary:     Effort: Pulmonary effort is normal.     Breath sounds: Normal breath sounds and air entry.  Abdominal:     General: Bowel sounds are normal. There is distension.     Palpations: Abdomen is soft.     Tenderness: There is generalized abdominal tenderness.     Comments: Patient complains of left lower quadrant pain, site of paracentesis.  Musculoskeletal:        General: Normal range of motion.     Cervical back: Normal range of motion and neck supple.     Right lower leg: No edema.     Left lower leg: No edema.  Skin:    General: Skin is warm and dry.     Capillary Refill: Capillary refill takes less than 2 seconds.  Neurological:     General: No focal deficit present.     Mental Status: She is oriented to person, place, and time. She is lethargic.     GCS: GCS eye subscore is 4. GCS verbal subscore is 5. GCS motor subscore is 6.     Cranial Nerves: Cranial nerves 2-12 are  intact.     Sensory: Sensation is intact.     Motor: Motor function is intact.     Coordination: Coordination is intact.     Comments: Generalized weakness without focal deficit appreciated  Psychiatric:        Mood and Affect: Mood normal.     (all labs ordered are listed, but only abnormal results are displayed) Labs Reviewed  GLUCOSE, CAPILLARY - Abnormal; Notable for the following components:      Result Value   Glucose-Capillary 228 (*)    All other components within normal limits  CBC WITH DIFFERENTIAL/PLATELET - Abnormal; Notable for the following components:   WBC 2.1 (*)    RBC 2.56 (*)    Hemoglobin 7.5 (*)    HCT 23.1 (*)    RDW 17.6 (*)    Platelets 54 (*)    Neutro Abs 1.3 (*)    Lymphs Abs 0.4 (*)    All other components within normal limits  COMPREHENSIVE METABOLIC PANEL WITH GFR - Abnormal; Notable for the following components:   Sodium 130 (*)    Glucose, Bld 210 (*)    Calcium  8.1 (*)    Total Protein 6.3 (*)    Albumin  3.1 (*)    Total Bilirubin 3.6 (*)    All other components within normal limits  AMMONIA - Abnormal; Notable for the following components:   Ammonia 70 (*)    All other components within normal limits  PROTIME-INR - Abnormal; Notable for the following components:   Prothrombin Time 22.3 (*)    INR 1.9 (*)    All other components within normal limits  I-STAT VENOUS BLOOD GAS, ED - Abnormal; Notable for the following components:   pH, Ven 7.596 (*)    pCO2, Ven 23.9 (*)    pO2, Ven 189 (*)    Sodium 133 (*)    Calcium , Ion 1.08 (*)    HCT 24.0 (*)    Hemoglobin 8.2 (*)    All other components within normal limits  URINALYSIS, ROUTINE W REFLEX  MICROSCOPIC  ETHANOL  LACTIC ACID, PLASMA  CBG MONITORING, ED  TYPE AND SCREEN    EKG: None  Radiology: IR Paracentesis Result Date: 01/13/2024 INDICATION: Patient with a history of NASH cirrhosis with recurrent ascites. Interventional Radiology asked to perform a therapeutic  paracentesis. EXAM: ULTRASOUND GUIDED PARACENTESIS MEDICATIONS: 1% lidocaine  10 mL COMPLICATIONS: None immediate. PROCEDURE: Informed written consent was obtained from the patient after a discussion of the risks, benefits and alternatives to treatment. A timeout was performed prior to the initiation of the procedure. Initial ultrasound scanning demonstrates a large amount of ascites within the left lower abdominal quadrant. The left lower abdomen was prepped and draped in the usual sterile fashion. 1% lidocaine  was used for local anesthesia. Following this, a 19 gauge, 7-cm, Yueh catheter was introduced. An ultrasound image was saved for documentation purposes. The paracentesis was performed. The catheter was removed and a dressing was applied. The patient tolerated the procedure well without immediate post procedural complication. Patient received post-procedure intravenous albumin ; see nursing notes for details. FINDINGS: A total of approximately 6.3 L of clear yellow fluid was removed. IMPRESSION: Successful ultrasound-guided paracentesis yielding 6.3 liters of peritoneal fluid. Procedure performed by: Warren Dais, NP PLAN: Patient presented to IR today for an outpatient paracentesis. Patient became increasingly lethargic during paracentesis. Blood pressure slightly low, blood sugar 228. Per report from patient's sister the patient was advised to go to [facility], 01/12/24 for admission due to reaching end stage liver disease with an increased urgency for liver transplant. Patient declined to go to [facility]. After discussion with patient's sister (bedside) and husband (via telephone) the decision was made to take patient to the ED. The patient's husband called Stephane Quest, NP with [facility] and the goal is for an ED to ED transport to [facility] in [place]. Patient was taken to room 3 in the [facility] ED. An ED provider was not available and I left my cell number with the  ED nurses. The patient was checked into room 3 by the ED nurses. The patient's sister is at the bedside. Electronically Signed   By: Juliene Balder M.D.   On: 01/13/2024 13:42   DG Chest Port 1 View Result Date: 01/13/2024 CLINICAL DATA:  Altered mental status. EXAM: PORTABLE CHEST 1 VIEW COMPARISON:  January 09, 2024. FINDINGS: The heart size and mediastinal contours are within normal limits. Both lungs are clear. The visualized skeletal structures are unremarkable. IMPRESSION: No active disease. Electronically Signed   By: Lynwood Landy Raddle M.D.   On: 01/13/2024 13:12     .Critical Care  Performed by: Myriam Dorn BROCKS, PA Authorized by: Myriam Dorn BROCKS, PA   Critical care provider statement:    Critical care time (minutes):  75   Critical care was necessary to treat or prevent imminent or life-threatening deterioration of the following conditions:  Hepatic failure   Critical care was time spent personally by me on the following activities:  Development of treatment plan with patient or surrogate, discussions with consultants, evaluation of patient's response to treatment, examination of patient, obtaining history from patient or surrogate, ordering and performing treatments and interventions, ordering and review of laboratory studies, ordering and review of radiographic studies, re-evaluation of patient's condition and review of old charts   Care discussed with: admitting provider and accepting provider at another facility      Medications Ordered in the ED  lidocaine -EPINEPHrine  (XYLOCAINE  W/EPI) 1 %-1:100000 (with pres) injection 20 mL (20 mLs Intradermal Not Given  01/13/24 1520)  cefTRIAXone  (ROCEPHIN ) 1 g in sodium chloride  0.9 % 100 mL IVPB (has no administration in time range)  albumin  human 25 % solution 37.5 g (0 g Intravenous Stopped 01/13/24 1521)  fentaNYL  (SUBLIMAZE ) injection 50 mcg (50 mcg Intravenous Given 01/13/24 1331)  lactulose  (CHRONULAC ) 10 GM/15ML solution 20 g (20 g  Oral Given 01/13/24 1528)  sodium chloride  0.9 % bolus 1,000 mL (1,000 mLs Intravenous New Bag/Given 01/13/24 1528)  fentaNYL  (SUBLIMAZE ) injection 50 mcg (50 mcg Intravenous Given 01/13/24 1527)                                    Medical Decision Making Amount and/or Complexity of Data Reviewed Labs: ordered. Radiology: ordered.  Risk OTC drugs. Prescription drug management. Decision regarding hospitalization.   Medical Decision Making:   Laurie Sutton is a 43 y.o. female who presented to the ED today with lethargy post-procedure detailed above.    External chart has been reviewed including previous labs, imaging, admission records. Patient's presentation is complicated by their history of liver failure.  Patient placed on continuous vitals and telemetry monitoring while in ED which was reviewed periodically.  Complete initial physical exam performed, notably the patient  was lethargic but easily awakened, A/Ox 4 and GCS 15.    Reviewed and confirmed nursing documentation for past medical history, family history, social history.    Initial Assessment:   With the patient's presentation of lethargy, most likely diagnosis is hepatic encephalopathy. Other diagnoses were considered including (but not limited to) acute hypoglycemia, acute neurovascular insult, . These are considered less likely due to history of present illness and physical exam findings.     Initial Plan:  Spot CBG for glucose monitoring  Screening labs including CBC and Metabolic panel to evaluate for infectious or metabolic etiology of disease.  Urinalysis with reflex culture ordered to evaluate for UTI or relevant urologic/nephrologic pathology.  CXR to evaluate for structural/infectious intrathoracic pathology.  EKG to evaluate for cardiac pathology Obtain ammonia and EtOH levels secondary to likely hepatic encephalopathy Assess serum lactate Objective evaluation as below reviewed   Initial Study Results:    Laboratory  All laboratory results reviewed without evidence of clinically relevant pathology.   Exceptions include: Sodium is 130, glucose 210, elevated bilirubin to 3.6.  Leukopenia of 2.1, hemoglobin 7.5, platelets 54, elevated PT of 22.3 with INR of 1.9.  Ammonia 70  EKG EKG was reviewed independently. Rate, rhythm, axis, intervals all examined and without medically relevant abnormality. ST segments without concerns for elevations.    Radiology:  All images reviewed independently. Agree with radiology report at this time.   IR Paracentesis Result Date: 01/13/2024 INDICATION: Patient with a history of NASH cirrhosis with recurrent ascites. Interventional Radiology asked to perform a therapeutic paracentesis. EXAM: ULTRASOUND GUIDED PARACENTESIS MEDICATIONS: 1% lidocaine  10 mL COMPLICATIONS: None immediate. PROCEDURE: Informed written consent was obtained from the patient after a discussion of the risks, benefits and alternatives to treatment. A timeout was performed prior to the initiation of the procedure. Initial ultrasound scanning demonstrates a large amount of ascites within the left lower abdominal quadrant. The left lower abdomen was prepped and draped in the usual sterile fashion. 1% lidocaine  was used for local anesthesia. Following this, a 19 gauge, 7-cm, Yueh catheter was introduced. An ultrasound image was saved for documentation purposes. The paracentesis was performed. The catheter was removed and a dressing was applied.  The patient tolerated the procedure well without immediate post procedural complication. Patient received post-procedure intravenous albumin ; see nursing notes for details. FINDINGS: A total of approximately 6.3 L of clear yellow fluid was removed. IMPRESSION: Successful ultrasound-guided paracentesis yielding 6.3 liters of peritoneal fluid. Procedure performed by: Warren Dais, NP PLAN: Patient presented to IR today for an outpatient paracentesis. Patient became  increasingly lethargic during paracentesis. Blood pressure slightly low, blood sugar 228. Per report from patient's sister the patient was advised to go to [facility], 01/12/24 for admission due to reaching end stage liver disease with an increased urgency for liver transplant. Patient declined to go to [facility]. After discussion with patient's sister (bedside) and husband (via telephone) the decision was made to take patient to the ED. The patient's husband called Stephane Quest, NP with [facility] and the goal is for an ED to ED transport to [facility] in [place]. Patient was taken to room 3 in the [facility] ED. An ED provider was not available and I left my cell number with the ED nurses. The patient was checked into room 3 by the ED nurses. The patient's sister is at the bedside. Electronically Signed   By: Juliene Balder M.D.   On: 01/13/2024 13:42   DG Chest Port 1 View Result Date: 01/13/2024 CLINICAL DATA:  Altered mental status. EXAM: PORTABLE CHEST 1 VIEW COMPARISON:  January 09, 2024. FINDINGS: The heart size and mediastinal contours are within normal limits. Both lungs are clear. The visualized skeletal structures are unremarkable. IMPRESSION: No active disease. Electronically Signed   By: Lynwood Landy Raddle M.D.   On: 01/13/2024 13:12   CT ABDOMEN PELVIS W CONTRAST Result Date: 01/06/2024 CLINICAL DATA:  Acute abdominal pain, history of cirrhosis and recent paracentesis EXAM: CT ABDOMEN AND PELVIS WITH CONTRAST TECHNIQUE: Multidetector CT imaging of the abdomen and pelvis was performed using the standard protocol following bolus administration of intravenous contrast. RADIATION DOSE REDUCTION: This exam was performed according to the departmental dose-optimization program which includes automated exposure control, adjustment of the mA and/or kV according to patient size and/or use of iterative reconstruction technique. CONTRAST:  75mL OMNIPAQUE  IOHEXOL  350 MG/ML SOLN  COMPARISON:  12/18/2023 FINDINGS: Lower chest: No acute pleural or parenchymal lung disease. Hepatobiliary: Stable cirrhotic morphology of the liver. Calcified gallstones without evidence of acute cholecystitis. No biliary duct dilation. Pancreas: Unremarkable. No pancreatic ductal dilatation or surrounding inflammatory changes. Spleen: Stable splenomegaly consistent with portal venous hypertension. No focal parenchymal splenic abnormality. Adrenals/Urinary Tract: Adrenal glands are unremarkable. Kidneys are normal, without renal calculi, focal lesion, or hydronephrosis. Bladder is unremarkable. Stomach/Bowel: No bowel obstruction or ileus. Continued mild bowel wall thickening most pronounced within the proximal colon, again most consistent with portal enteropathy and colopathy given cirrhosis and ascites. Normal appendix right lower quadrant. Vascular/Lymphatic: Dilated main portal vein consistent with portal venous hypertension, stable. Recanalized umbilical vein and upper abdominal varices unchanged. No pathologic adenopathy. Reproductive: Status post hysterectomy. No adnexal masses. Other: Mild to moderate ascites, similar to prior CT. No free intraperitoneal gas. No abdominal wall hernia. Musculoskeletal: No acute or destructive bony abnormalities. Reconstructed images demonstrate no additional findings. IMPRESSION: 1. Stable findings of cirrhosis and portal venous hypertension, manifested by ascites, splenomegaly, and abdominal varices. 2. Continued wall thickening of the small and large bowel most consistent with portal colopathy/enteropathy given cirrhosis and presence of ascites. 3. Cholelithiasis without cholecystitis. Electronically Signed   By: Ozell Daring M.D.   On: 01/06/2024 16:19  Consults: Case discussed with transplant team at [facility].   Reassessment and Plan:   Multiple consults made with liver transplant team.  Initially spoke with Stephane Quest, FNP who states to contact the  liver transplant team coordinator and the plan was for her to go to [facility] in [place] for workup for liver transplant.  Contacted the [facility] physician line, spoke with a Harvest Ashworth, PA-C who is with [facility] hepatology.  After extensive discussions, they have excepted this patient for admission to their transplant unit for workup for liver transplant.   Accepting provider is Ana Gruber, MD at this time, plan is to await for bed placement and transfer to [facility].  Orders placed for patient's home medications.  Still awaiting bed placement, care signed off to D. Patt, MD.       Final diagnoses:  Hepatic encephalopathy in fulminant hepatic failure [facility])    ED Discharge Orders     None          Myriam Dorn BROCKS, [place] 01/13/24 2141    Simon Lavonia SAILOR, MD 01/14/24 [phone]

## 2024-01-13 NOTE — Procedures (Signed)
 Patient presented to IR today for an outpatient paracentesis. Patient became increasingly lethargic during paracentesis. Blood pressure slightly low, blood sugar 228. Per report from patient's sister the patient was advised to go to [facility], 01/12/24 for admission due to reaching end stage liver disease with an increased urgency for liver transplant. Patient declined to go to [facility].   After discussion with patient's sister (bedside) and husband (via telephone) the decision was made to take patient to the ED. The patient's husband called Stephane Quest, NP with [facility] and the goal is for an ED to ED transport to [facility] in [place]. Patient was taken to room 3 in the [facility] ED. An ED provider was not available and I left my cell number with the ED nurses. The patient was checked into room 3 by the ED nurses. The patient's sister is at the bedside.      PROCEDURE SUMMARY:  Successful ultrasound guided paracentesis from the left lower quadrant.  Yielded 6.3 L of clear yellow fluid.  No immediate complications.  The patient tolerated the procedure well.   Specimen not sent for labs.  EBL < 2 mL  ** Patient is on the transplant list with [facility].   Warren Dais, AGACNP-BC 01/13/2024, 12:09 PM

## 2024-01-13 NOTE — ED Triage Notes (Signed)
 Pt coming from IR post paracentesis. Pt was supposed to go to [facility] yesterday but wanted to wait until after the weekend. Pt has had this episode before with her last paracentesis and was noted with elevated ammonia levels. Pt currently alert and oriented x4 but remains very lethargic.

## 2024-01-14 DIAGNOSIS — R188 Other ascites: Secondary | ICD-10-CM | POA: Diagnosis present

## 2024-01-14 DIAGNOSIS — I959 Hypotension, unspecified: Secondary | ICD-10-CM | POA: Diagnosis not present

## 2024-01-14 DIAGNOSIS — R531 Weakness: Secondary | ICD-10-CM | POA: Diagnosis not present

## 2024-01-14 DIAGNOSIS — Z7401 Bed confinement status: Secondary | ICD-10-CM | POA: Diagnosis not present

## 2024-01-14 MED ORDER — DIPHENHYDRAMINE HCL 50 MG/ML IJ SOLN
25.0000 mg | Freq: Four times a day (QID) | INTRAMUSCULAR | Status: DC | PRN
  Administered 2024-01-14: 25 mg via INTRAVENOUS
  Filled 2024-01-14: qty 1

## 2024-01-14 MED ORDER — HYDROMORPHONE HCL 1 MG/ML IJ SOLN
1.0000 mg | INTRAMUSCULAR | Status: DC | PRN
  Administered 2024-01-14: 1 mg via INTRAVENOUS
  Filled 2024-01-14: qty 1

## 2024-01-14 NOTE — ED Notes (Signed)
 [facility] called for transport. No ETA given

## 2024-01-14 NOTE — ED Notes (Addendum)
 The husband left his number adam Lisa  he wants to be called as she is transferred so that he can be there  when she arrives his phone number is [phone]

## 2024-01-14 NOTE — ED Provider Notes (Signed)
  Physical Exam  BP (!) 115/96   Pulse 94   Temp 98.5 F (36.9 C) (Oral)   Resp (!) 22   Ht 1.676 m (5' 6)   Wt 86.2 kg   LMP 11/13/2020 (Exact Date)   SpO2 98%   BMI 30.67 kg/m   Physical Exam  Procedures  Procedures  ED Course / MDM    Medical Decision Making Amount and/or Complexity of Data Reviewed Labs: ordered. Radiology: ordered.  Risk OTC drugs. Prescription drug management.   Patient be transferred to [facility].  Patient with history of liver failure, history of [name], patient is on transplant list at [facility].  They accepted patient for admission to their transplant unit for workup. Patient has had some ongoing pain.  She is hemodynamically stable here in the ED with last blood pressure 115/90, afebrile, heart rate 94, sats 98% on room air.  Transport team is here to transport patient to [facility].  She appears stable for transport      Levander Houston, MD 01/14/24 1059

## 2024-01-14 NOTE — ED Notes (Signed)
 [facility] has a ready bed. Patient is going to room 7918. Please call report to [phone].

## 2024-01-14 NOTE — Consults (Signed)
 [facility]   Requesting provider: Sherrilyn Lunger, PA with [facility]  Reason for Consult: Decompensated MASLD cirrhosis of the complete liver transplant evaluation  Admission date: 01/14/2024  Date of service: 01/14/24                                                                   Patient name: Laurie Sutton DOB: 1980-11-12 MRN: [id number]     History of Present Illness  Laurie Sutton is a 43 y.o. 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  She was accepted in transfer from [facility] in order to complete her liver transplant evaluation.  Briefly she had paracentesis locally on 01/13/2024 and became lethargic and difficult to arouse at the outpatient paracentesis department.  She was transferred to the local ED for further evaluation, patient contacted the hepatology team and requested transfer to [facility].  Of note she has had numerous hospitalizations locally related to her decompensated cirrhosis.  Labs at [facility] yesterday with MELD 3.0 score 23.  She underwent a 6.3 L paracentesis at the local hospital.  No fluid studies were sent however she was negative for SBP by fluid studies on 01/10/2024  Chest x-ray yesterday unremarkable.  Lactic acid level 1.2.  At the time of my evaluation here on 7 tower she is alert and oriented interacting appropriately.  At home she is on furosemide  80 mg daily and spironolactone  150 mg daily for ascites.  No history of SBP.  For hepatic encephalopathy she is on lactulose  and Xifaxan .  She is not on a beta-blocker given hypotension.  Her midodrine  dose as an outpatient was recently increased to 10 mg 3 times a day.  Blood pressures currently are acceptable  In order to complete a liver transplant evaluation, she still  needs the following: -CT coronary scan,  -cardiology evaluation given she had acute CHF earlier this year in the setting of colitis and sepsis,  -dental clearance  - surgeon evaluation   Medical history: Decompensated MASH cirrhosis Ascites  nonbleeding esophageal varices Hepatic encephalopathy Diabetes Diabetic neuropathy Obesity  GI procedures: Transjugular liver biopsy April 2025 cirrhosis of uncertain etiology with no features vitamin hepatitis.  Microcytic steatosis present but no steatohepatitis.  HV PG of 20 mmHg  EGD April 2025: Grade 2 esophageal varices, portal hypertensive gastropathy  Colonoscopy April 2025 with a single polyp consistent with tubular adenoma.    Flexible sigmoidoscopy August 2025 for rectal bleeding found to have internal hemorrhoids   Surgical history: Cesarean section Laparoscopic total vaginal hysterectomy  Social history: Married.  43 year old and a 43 year old at home.  No regular use of alcohol.  No tobacco use.   Current Medications[1]  Allergies[2]      Objective  VITALS:  Current  Temperature: 98.4 F (36.9 C)  Heart Rate: 96  Blood Pressure: (!) 114/49  Resp Rate: 20  Oxygen Sats: 99 %  Weight: 81.3 kg (179 lb 3.2 oz)   No intake or output data in the 24 hours ending 01/14/24 1447  General: Chronic ill-appearing female who is alert and oriented resting comfortably in bed HEENT: Anicteric pupils  equal and round Heart: Regular rate and rhythm. Lungs: Clear nonlabored breathing Abdomen: Markedly distended with ascites but is soft and nontender to palpation.  Bowel sounds noted Neurologic: Mild asterixis on exam, interacting appropriately answering questions appropriately Psych: Appropriate mood and affect     Data review from [facility]:  Labs on 10/3: INR 1.9, sodium 130, potassium 3.6, creatinine 0.74, albumin  3.1, total liver 3.6, alk phos 77, AST 33, ALT 21, lactic acid level 1.2, alcohol screen  negative  Chest x-ray unremarkable on 10/3  Paracentesis with 6.3 L of fluid removed on 10/3.  Do not see where fluid was sent for cell count or culture.  No evidence of SBP on outside ascitic fluid studies dated 9/30    Assessment and Plan  Laurie Sutton is a 43 y.o. female  with decompensated MASH cirrhosis (MELD 3.0 score 23, Blood Group O)  being evaluated for liver transplant c/b nonbleeding esophageal varices, ascites and hepatic encephalopathy.  She is transferred from OSH after becoming lethargic and mental status changes during outpatient paracentesis on 10/3.   # Decompensated MASH cirrhosis (MELD 3.0 score 23, Blood Group O)  Undergoing liver transplant evaluation and still needs the following items completed which we will obtain during this admission:  -CT coronary scan,   -cardiology evaluation given she had acute CHF earlier this year in the setting of colitis and sepsis,   -dental clearance   - surgeon evaluation   HCC Screening: no liver lesions on outside MRI August 2025; she also had CT abdomen/pelvis on 01/06/2024 at [facility]   #Ascites: - Continue her home dosing of furosemide  80 mg daily and spironolactone  150 mg daily.  - s/p 6.3 liter para at OSH on 10/3  - no SBP by cell count on 9/30  - para as needed  - 2 gram sodium diet  -No renal issues.  # Hepatic encephalopathy - [facility] grade 1 today with mild asterixis on exam but she is alert and oriented interacting appropriately  -Continue with lactulose  titrated to have 4 bowel movements daily and Xifaxan  550 mg twice a day  -Limit pain medications sedatives sleep medication   #Nonbleeding esophageal varices - Not on a beta-blocker given history of hypotension.  - Last endoscopy April 2025 with grade 2 esophageal varices and portal hypertensive gastropathy  #Hyponatremia: - stable Na of 130 on 10/3;  monitor; for now will continue current diuretic dosing of furosemide  80  mg day and spironolactone  150 mg day  #Hypotension: - On midodrine  10 mg 3 times daily at home.  Will continue during this admission.  BP currently 114/49     [name]      [facility]   For any questions or concerns please send a message to [facility] Liver Hepatology Consult   Hepatology attending I have seen and examined the patient and discussed the patient with the APP. I performed the key elements of the history and physical exam. She was transferred for treatment of hepatic encephalopathy and liver transplant evaluation. She is sleepy this morning which could be partly from narcotics, benzodiazepines, metoclopramide, compazine  if she recceived some or all of them. She's  had bad crampoing and she's on baclofen. Rec magnesium , potassium.  Will proceed with liver transplant evaluation.   Lab Results (last 24 hours)     Procedure Component Value Ref Range Date/Time   POC Glucose [[id number]]  (Abnormal) Collected: 01/15/24 0801   Lab Status: Final  result Specimen: Blood from Capillary Updated: 01/15/24 0804    Glucose, POC 188* 70 - 110 mg/dL     Comment: AC BEFORE MEAL     POC Glucose [[id number]]  (Abnormal) Collected: 01/15/24 0436   Lab Status: Final result Specimen: Blood from Capillary Updated: 01/15/24 0447    Glucose, POC 213* 70 - 110 mg/dL     Comment: Timed RN NOTIFIED     Basic Metabolic Panel [[id number]]  (Abnormal) Collected: 01/15/24 0253   Lab Status: Final result Specimen: Blood from Venous Updated: 01/15/24 0353    Sodium 131* 134 - 143 mmol/L     Potassium 3.4* 3.5 - 5.1 mmol/L     Chloride 97* 98 - 107 mmol/L     CO2 25 21 - 31 mmol/L     Anion Gap 9 3 - 15 mmol/L     Glucose, Random 173* 70 - 125 mg/dL     Blood Urea Nitrogen (BUN) 9 8 - 30 mg/dL     Creatinine 9.15 9.48 - 0.95 mg/dL     eGFR 89 >40 fO/fpw/8.26f7     Comment: GFR estimated by CKD-EPI equations (NKF 2021). Recommend confirmation of eGFR  using Cystatin C based eGFR in complex cases for clinical decision-making.      Calcium  8.2* 8.6 - 10.3 mg/dL     BUN/Creatinine Ratio --    Comment: Creatinine is normal, ratio is not clinically indicated.      CBC without Differential [[id number]]  (Abnormal) Collected: 01/15/24 0253   Lab Status: Final result Specimen: Blood from Venous Updated: 01/15/24 0318   Narrative:     The following orders were created for panel order CBC without Differential. Procedure                               Abnormality         Status                    ---------                               -----------         ------                    CBC without Differential[[id number]]    Abnormal            Final result               Please view results for these tests on the individual orders.   Liver Function Panel [[id number]]  (Abnormal) Collected: 01/15/24 0253   Lab Status: Final result Specimen: Blood from Venous Updated: 01/15/24 0353    Albumin  2.9* 3.7 - 4.8 g/dL     Bilirubin, Total 2.8* 0.3 - 1.0 mg/dL     Bilirubin, Direct 1.2* 0.0 - 0.2 mg/dL     Alkaline Phosphatase (ALP) 93 38 - 120 U/L     Aspartate Aminotransferase (AST) 35 13 - 39 U/L     Alanine Aminotransferase (ALT) 19 7 - 52 U/L     Total Protein 6.1* 6.3 - 8.3 g/dL    Prothrombin Time (PT) with INR [[id number]]  (Abnormal) Collected: 01/15/24 0253   Lab Status: Final result Specimen: Blood from Venous Updated: 01/15/24 0339    Prothrombin Time (PT) 21.6* 11.8 - 14.4 seconds     International Normalized  Ratio (INR) 1.9      CBC without Differential [[id number]]  (Abnormal) Collected: 01/15/24 0253   Lab Status: Final result Specimen: Blood from Venous Updated: 01/15/24 0318    WBC 3.08* 3.60 - 11.70 10*3/uL     RBC 2.89* 3.72 - 5.24 10*6/uL     Hemoglobin 8.4* 10.8 - 15.5 g/dL     Hematocrit 26* 34 - 46 %     Mean Corpuscular Volume (MCV) 89 80 - 100 fL     Mean Corpuscular Hemoglobin (MCH) 29 26 - 34 pg     Mean Corpuscular  Hemoglobin Conc (MCHC) 33 32 - 35 g/dL     Red Cell Distribution Width (RDW) 18.2* 12.2 - 16.6 %     Platelet Count (PLT) 63* 153 - 400 10*3/uL     Mean Platelet Volume (MPV) 8.5 7.3 - 11.2 fL    Magnesium  [[id number]]  (Normal) Collected: 01/15/24 0253   Lab Status: Final result Specimen: Blood from Venous Updated: 01/15/24 0757    Magnesium  1.6 1.6 - 2.5 mg/dL    Phosphorus [[id number]]  (Normal) Collected: 01/15/24 0253   Lab Status: Final result Specimen: Blood from Venous Updated: 01/15/24 0757    Phosphorus 3.8 2.5 - 5.0 mg/dL    POC Glucose [[id number]]  (Abnormal) Collected: 01/15/24 0135   Lab Status: Final result Specimen: Blood from Capillary Updated: 01/15/24 0137    Glucose, POC 190* 70 - 110 mg/dL     Comment: Timed RN NOTIFIED     POC Glucose [[id number]]  (Abnormal) Collected: 01/14/24 2047   Lab Status: Final result Specimen: Blood from Capillary Updated: 01/14/24 2050    Glucose, POC 432* 70 - 110 mg/dL     Comment: RN NOTIFIED     Blood Culture, 2nd Set [[id number]] Collected: 01/14/24 1747   Lab Status: In process Specimen: Blood from Venous Updated: 01/14/24 1814   POC Glucose [[id number]]  (Abnormal) Collected: 01/14/24 1747   Lab Status: Final result Specimen: Blood from Capillary Updated: 01/14/24 1748    Glucose, POC 554* 70 - 110 mg/dL     Comment: [facility] BEFORE MEAL     Comprehensive Metabolic Panel [[id number]]  (Abnormal) Collected: 01/14/24 1741   Lab Status: Final result Specimen: Blood from Venous Updated: 01/14/24 1852    Sodium 127* 134 - 143 mmol/L     Potassium 3.5 3.5 - 5.1 mmol/L     Chloride 94* 98 - 107 mmol/L     CO2 23 21 - 31 mmol/L     Anion Gap 10 3 - 15 mmol/L     Glucose, Random 517* 70 - 125 mg/dL     Blood Urea Nitrogen (BUN) 9 8 - 30 mg/dL     Creatinine 9.17 9.48 - 0.95 mg/dL     eGFR >09 >40 fO/fpw/8.26f7     Comment: GFR estimated by CKD-EPI equations (NKF 2021). Recommend confirmation of eGFR using Cystatin C based eGFR in  complex cases for clinical decision-making.      Albumin  3.2* 3.7 - 4.8 g/dL     Total Protein 6.9 6.3 - 8.3 g/dL     Bilirubin, Total 4.7* 0.3 - 1.0 mg/dL     Alkaline Phosphatase (ALP) 76 38 - 120 U/L     Aspartate Aminotransferase (AST) 46* 13 - 39 U/L     Alanine Aminotransferase (ALT) 20 7 - 52 U/L     Calcium  8.0* 8.6 - 10.3 mg/dL     BUN/Creatinine Ratio --    Comment: Creatinine  is normal, ratio is not clinically indicated.       Corrected Calcium  8.6 8.6 - 10.3 mg/dL    TSH With Reflex To Free T4 [[id number]] Collected: 01/14/24 1741   Lab Status: Final result Specimen: Blood from Venous Updated: 01/14/24 1902    TSH 0.593 0.450 - 5.330 uIU/mL     TSH Pregnancy Comment --    Comment: TSH in Pregnancy: 1st Trimester of Pregnancy 0.050 - 3.700 uIU/mL 2nd Trimester of Pregnancy 0.310 - 4.350 uIU/mL 3rd Trimester of Pregnancy 0.410 - 5.180 uIU/mL       Narrative:     The testing method is an immunoassay manufactured by [employer] performed on [employer].  Values obtained with different assay methods may be different and cannot be used interchangeably.   CBC with Differential [[id number]]  (Abnormal) Collected: 01/14/24 1741   Lab Status: Final result Specimen: Blood from Venous Updated: 01/14/24 2103   Narrative:     The following orders were created for panel order CBC with Differential. Procedure                               Abnormality         Status                    ---------                               -----------         ------                    CBC with Differential[[id number]]       Abnormal            Final result               Please view results for these tests on the individual orders.   Prothrombin Time (PT) with INR [[id number]]  (Abnormal) Collected: 01/14/24 1741   Lab Status: Final result Specimen: Blood from Venous Updated: 01/14/24 1827    Prothrombin Time (PT) 21.7* 11.8 - 14.4 seconds     International Normalized Ratio (INR) 1.9       Blood Culture, 1st Set [[id number]] Collected: 01/14/24 1741   Lab Status: In process Specimen: Blood from Venous Updated: 01/14/24 1814   Hemoglobin A1C With Estimated Average Glucose [[id number]]  (Abnormal) Collected: 01/14/24 1741   Lab Status: Final result Specimen: Blood from Venous Updated: 01/15/24 0345    Hemoglobin A1c 6.9* <5.7 %     Estimated Average Glucose 151* <117 mg/dL    Narrative:     J8R Diagnostic Criteria:  Prediabetes:              5.7% - 6.4% Diabetes:                 >=6.5%  A1C Glycemic Goals:  Older Adults:             <7.0% Children and Adolescents: <7.0% Pregnant Women:           <6.0%  A1C goals must be individualized and reassessed over time.  This test is performed on Cap[employer]s 3 ([employer] and is certified by the [employer].  This method is not affected by HbC, HbS, HbE or HbD trait or by HbF <23.0%.  Reference:  Standards of Medical Care in  Diabetes. Diabetes Care, 43(1), S1-S212, 2020.   CBC with Differential [[id number]]  (Abnormal) Collected: 01/14/24 1741   Lab Status: Final result Specimen: Blood from Venous Updated: 01/14/24 2103    WBC 2.07* 3.60 - 11.70 10*3/uL     RBC 2.95* 3.72 - 5.24 10*6/uL     Hemoglobin 8.8* 10.8 - 15.5 g/dL     Hematocrit 26* 34 - 46 %     Mean Corpuscular Volume (MCV) 89 80 - 100 fL     Mean Corpuscular Hemoglobin (MCH) 30 26 - 34 pg     Mean Corpuscular Hemoglobin Conc (MCHC) 33 32 - 35 g/dL     Red Cell Distribution Width (RDW) 18.1* 12.2 - 16.6 %     Platelet Count (PLT) 58* 153 - 400 10*3/uL     Comment: Platelet count confirmed by smear review.      Mean Platelet Volume (MPV) 8.7 7.3 - 11.2 fL     Neutrophils % 75 %     Lymphocytes % 12 %     Monocytes % 10 %     Eosinophils % 3 %     Basophils % 1 %     nRBC % 0 0 - 0 %     Neutrophils Absolute 1.50* 1.60 - 8.20 10*3/uL     Lymphocytes # 0.20* 1.07 - 3.45 10*3/uL     Monocytes # 0.20 0.20 - 0.85 10*3/uL      Eosinophils # 0.10 0.00 - 0.30 10*3/uL     Basophils # 0.00 0.00 - 0.10 10*3/uL     nRBC Absolute 0.00 <=1.00 10*3/uL    POC Glucose [[id number]]  (Abnormal) Collected: 01/14/24 1608   Lab Status: Final result Specimen: Blood from Capillary Updated: 01/14/24 1609    Glucose, POC 515* 70 - 110 mg/dL     Comment: AC BEFORE MEAL     Urinalysis with Reflex to Microscopic [[id number]]  (Normal) Collected: 01/14/24 1521   Lab Status: Final result Specimen: Urine from Clean Catch Updated: 01/14/24 1546    Color, Urine Light Yellow Yellow     Clarity, Urine Clear Clear     Specific Gravity, Urine 1.008 1.005 - 1.030     pH, Urine 5.0 5.0 - 8.0     Protein, Urine Negative Negative mg/dL     Glucose, Urine Negative Negative mg/dL     Ketones, Urine Negative Negative mg/dL     Bilirubin, Urine Negative Negative mg/dL     Blood, Urine Negative Negative mg/dL     Nitrite, Urine Negative Negative     Leukocyte Esterase, Urine Negative <75 Leu/uL     Urobilinogen, Urine Normal <2.0 mg/dL    Urine Culture [[id number]] Collected: 01/14/24 1521   Lab Status: In process Specimen: Urine from Clean Catch Updated: 01/14/24 1540   POC Glucose [[id number]]  (Abnormal) Collected: 01/14/24 1301   Lab Status: Final result Specimen: Blood from Capillary Updated: 01/14/24 1302    Glucose, POC 260* 70 - 110 mg/dL     Comment: AC BEFORE MEAL           Current Facility-Administered Medications:  .  acetaminophen  (TYLENOL ) tablet 650 mg .  baclofen (LIORESAL) tablet 10 mg .  dextrose  (D50W) 50 % injection 12.5 g .  dextrose  (GLUTOSE) 40 % oral gel 15 g .  diphenhydrAMINE  (BENADRYL ) tablet/capsule 25 mg .  FLUoxetine  (PROzac ) capsule 40 mg .  furosemide  (LASIX ) tablet 80 mg .  HYDROmorphone  (DILAUDID ) 1 mg/mL injection 0.5 mg .  hydrOXYzine  (  ATARAX ) tablet 10 mg .  insulin  glargine (LANTUS ) injection 9 Units .  insulin  lispro (HumaLOG ) injection 0-15 Units .  lactulose  (CHRONULAC ) 10 gram/15 mL  solution 20 g .  LORazepam  (ATIVAN ) tablet 0.5 mg .  magnesium  sulfate 4g in sterile water 100 mL IVPB .  metoclopramide (REGLAN) injection 5 mg .  metoprolol tartrate (LOPRESSOR) tablet 75 mg .  midodrine  (PROAMATINE ) tablet 10 mg .  montelukast  (SINGULAIR ) tablet 10 mg .  naloxone (NARCAN) injection 0.1 mg .  naloxone (NARCAN) injection 0.2 mg .  naloxone (NARCAN) injection 0.4 mg .  omeprazole  (PriLOSEC) DR capsule 40 mg .  ondansetron  (ZOFRAN ) injection 4 mg .  potassium chloride  (MICRO-K ) CR capsule 20 mEq .  prochlorperazine  (COMPAZINE ) injection 10 mg .  rifAXIMin  (XIFAXAN ) tablet 550 mg .  senna (SENOKOT) tablet 8.6 mg .  spironolactone  (ALDACTONE ) tablet 150 mg            [1]  Current Facility-Administered Medications:  .  acetaminophen  (TYLENOL ) tablet 650 mg, 650 mg, oral, Q6H PRN, Sherrilyn Lunger, PA .  baclofen (LIORESAL) tablet 10 mg, 10 mg, oral, BID PRN, Tamera Martin, PA .  dextrose  (D50W) 50 % injection 12.5 g, 12.5 g, intravenous, PRN, Sherrilyn Lunger, PA .  dextrose  (GLUTOSE) 40 % oral gel 15 g, 15 g, oral, PRN, Tamera Martin, PA .  diphenhydrAMINE  (BENADRYL ) tablet/capsule 25 mg, 25 mg, oral, Q8H PRN, Tamera Martin, PA .  FLUoxetine  (PROzac ) capsule 40 mg, 40 mg, oral, Daily, Sherrilyn Lunger, PA .  furosemide  (LASIX ) tablet 80 mg, 80 mg, oral, Daily, Tamera Martin, PA .  HYDROmorphone  (DILAUDID ) 1 mg/mL injection 0.5 mg, 0.5 mg, intravenous, Q4H PRN, Sherrilyn Lunger, PA .  hydrOXYzine  (ATARAX ) tablet 10 mg, 10 mg, oral, Q6H PRN, Tamera Martin, PA .  NOREEN ON 01/15/2024] insulin  glargine (LANTUS ) injection 9 Units, 9 Units, subcutaneous, Daily, Tamera Martin, PA .  insulin  lispro (HumaLOG ) injection 0-6 Units, 0-6 Units, subcutaneous, With meals & at bedtime, Sherrilyn Lunger, PA .  lactulose  (CHRONULAC ) 10 gram/15 mL solution 20 g, 20 g, oral, TID, Sherrilyn Lunger, PA .  LORazepam  (ATIVAN ) tablet 0.5 mg, 0.5 mg, oral, At Bedtime PRN, Sherrilyn Lunger, PA .   metoclopramide (REGLAN) injection 5 mg, 5 mg, intravenous, Q6H PRN, Tamera Martin, PA .  midodrine  (PROAMATINE ) tablet 10 mg, 10 mg, oral, TID, Tamera Martin, PA .  montelukast  (SINGULAIR ) tablet 10 mg, 10 mg, oral, At Bedtime, Sherrilyn Lunger, PA .  naloxone (NARCAN) injection 0.1 mg, 0.1 mg, intramuscular, Q10 Min PRN, Tamera Martin, PA .  naloxone (NARCAN) injection 0.2 mg, 0.2 mg, intravenous, Q2min PRN, Sherrilyn Lunger, PA .  naloxone [facility]) injection 0.4 mg, 0.4 mg, intravenous, Once PRN, Tamera Martin, PA .  omeprazole  (PriLOSEC) DR capsule 40 mg, 40 mg, oral, Daily, Tamera Martin, PA .  ondansetron  (ZOFRAN ) injection 4 mg, 4 mg, intravenous, Q6H PRN, Tamera Martin, PA, 4 mg at 01/14/24 1424 .  prochlorperazine  (COMPAZINE ) injection 10 mg, 10 mg, intravenous, Q6H PRN, Sherrilyn Lunger, PA .  rifAXIMin  (XIFAXAN ) tablet 550 mg, 550 mg, oral, BID, Sherrilyn Lunger, PA .  senna (SENOKOT) tablet 8.6 mg, 1 tablet, oral, Daily, Tamera Martin, PA .  spironolactone  (ALDACTONE ) tablet 150 mg, 150 mg, oral, Daily, Sherrilyn Lunger, PA [2] Allergies Allergen Reactions  . Penicillins Hives and Itching    Has patient had a PCN reaction causing immediate rash, facial/tongue/throat swelling, SOB or lightheadedness with hypotension: yes. Rash , Has patient had a PCN reaction causing severe rash  involving mucus membranes or skin necrosis: Yes- rash and hives all over body , Has patient had a PCN reaction that required hospitalization No, Has patient had a PCN reaction occurring within the last 10 years: Yes , If all of the above answers are NO, then may proceed with Cephalosporin use.  . Vancomycin  Itching  . Azithromycin  Other (See Comments)    DOES NOT WORK  . Empagliflozin -Metformin  Swelling  . Latex Other (See Comments)  . Metformin      Other Reaction(s): achy all over  . Morphine  Itching    Severe  . Semaglutide  Other (See Comments)  . Sitagliptin  Arthralgias and Other (See Comments)    Body aches all  day, Other reaction(s): Arthralgia (Joint Pain)  . Dulaglutide  GI Intolerance    constipation and stomach issues  JANUVIA  STOAMCH HURT?  *Some images could not be shown.

## 2024-01-14 NOTE — ED Notes (Addendum)
 Pt itching all over benadryl  given order of dr patt  family is going home soon

## 2024-01-14 NOTE — ED Notes (Signed)
 [facility] called to check the pick-up status.  Per [facility] staff, there are no trucks at the moment, but it should not be much longer before a truck arrives.

## 2024-01-15 NOTE — Progress Notes (Addendum)
 HOSPITAL MEDICINE -  PROGRESS NOTE  Hospital Day: #1   Admission Date:  01/14/2024   PCP:  Garnette DELENA Olmsted, MD   Extended Emergency Contact Information Primary Emergency Contact: Kutter,Adam Mobile Phone: [phone] Relation: Spouse   Hospital Course: [name] is a 43 y.o. female with history of [name] complicated by esophageal varices, ascites, hepatic encephalopathy, thrombocytopenia who presented to OSH with hepatic encephalopathy after undergoing outpatient paracentesis.  Had recently been seen outpatient by hepatology who recommended admission for expedited transplant eval however patient declined.  Patient discussed with hepatology at OSH who recommended transfer to [facility] for ongoing transplant evaluation.   Discharge Readiness:  Timeframe: >48H Dispo to: Home Barriers to Discharge: Further workup of liver transplantation  Subjective   No acute events overnight.  Patient bit drowsy due to receiving dose of baclofen.  Discontinued baclofen for now.  Cousin at bedside and updated.  Is very helpful with history.  States lately has been having paracentesis almost as frequent as every week or twice a week.  No fevers overnight.  Patient does endorse some muscle spasms, replating magnesium  and potassium.  Plan of care discussed with radiology tech and nursing in regards to plan CT coronary today for workup of liver transplantation.    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 complicated by esophageal varices, ascites, and encephalopathy  -last paracentesis 10/3 6.3L fluid removed, unclear if given albumin , no ascitic fluid studies sent -Hepatology consulted, pending recommendations -cont home lasix , lactulose , xifaxin, spironolactone  - CT coronary ordered Fever -patient  reports fever at OSH prior to transfer, recently treated with 7d course of Cipro  for UTI -CXR at OSH without evidence of infection -underwent paracentesis at OSH 10/3 but no cell count, ascitic culture sent -consider diagnositic para if recurrent fever -UA, blood cx, CBC sent and pending -hold on abx as no recurrent fever and no obvious infection, will start if appropriate -prn Tylenol  for fever Abdominal pain -patient reports chronic generalized abd pain, at baseline -recently started on PO morphine , reports she had significant itching, switched to Dialudid, also w/itching but tolerable with benadryl  -resume dialudid, prn benadry, hydroxyzine  available Hypotension, unspecified -cont home midodrine  Diabetes mellitus without complication    (CMD) -home meds include Lantus  GERD (gastroesophageal reflux disease) -Cont home PPI Diastolic dysfunction -TTE 9/22 - LVEF 60%, normal LV diastolic function, RV size and function normal, no ASD/PFO -G2DD on TTE 09/2023 -cont diuresis as above Thrombocytopenia -Plt 54, at baseline, d/t underlying liver dysfunction -trend cbc, avoid dvt ppx Muscle cramps -recently started on baclofen, will hold due to oversedation - Can restart at lower dose if needed - Replating magnesium  and potassium due to low levels in the interim Hx of C diff -recently treated w/dificid  09/2023, no recurrence of c diff after completing abx for UTI 12/2023 Restless leg syndrome -cont home ropinirole , hold baclofen due to oversedation, can retry lower dose, giving magnesium  Anxiety Depression -cont home fluoxetine  Hypokalemia -On diuretics, replating potassium, replating magnesium  as well due to low normal levels and muscle spasms     DVT Prophylaxis: Lovenox  SubQ  Active Medications: FLUoxetine , 40 mg, oral, Daily furosemide , 80 mg, oral, Daily insulin  glargine, 9 Units, subcutaneous, Daily insulin  aspart (NovoLOG )/insulin  lispro (HumaLOG ) injection, 0-15 Units,  subcutaneous, Q4H lactulose , 20 g, oral, TID magnesium  sulfate, 2 g, intravenous, Q2H midodrine , 10  mg, oral, TID AC montelukast , 10 mg, oral, At Bedtime omeprazole , 40 mg, oral, Daily 0600 potassium chloride , 20 mEq, oral, BID rifAXIMin , 550 mg, oral, BID senna, 1 tablet, oral, Daily spironolactone , 150 mg, oral, Daily   Infusions:   Objective   Temp:  [97.5 F (36.4 C)-98.8 F (37.1 C)] 97.9 F (36.6 C) Heart Rate:  [88-113] 88 Resp:  [16-18] 16 BP: (103-119)/(42-56) 104/52    Physical Exam General: Pleasant drowsy middle-aged Caucasian woman laying in bed in no acute distress HEENT:  normocephalic, anicteric sclera, normal hearing, moist oral mucosa, EOMI  CV:  RRR, no rubs, no murmurs, warm extremities Resp:  CTAB, symmetrical expansion, regular work of breathing  GI:  soft, moderately distended, generalized tenderness to deep palpation, no rebound Neuro:  A+Ox3, motor function grossly intact, following commands  MS:  no deformity, atraumatic Skin:  no rash, no active itching  Psych:  normal mood and affect, cooperative   Labs  Labs reviewed below: Hemoglobin 8.4 from 8.8, platelets stable at 63, WBC at 3 from 2, creatinine stable at 0.8, sodium at 131 from 127, potassium at 3.4, magnesium  at 1.6 Results from last 2 days  Lab Units 01/15/24 0253 01/14/24 1741  WHITE BLOOD CELL COUNT 10*3/uL 3.08* 2.07*  HEMOGLOBIN g/dL 8.4* 8.8*  HEMATOCRIT % 26* 26*  PLATELET COUNT 10*3/uL 63* 58*    Results from last 2 days  Lab Units 01/15/24 0253 01/14/24 1741  SODIUM mmol/L 131* 127*  POTASSIUM mmol/L 3.4* 3.5  CHLORIDE mmol/L 97* 94*  CO2 mmol/L 25 23  BUN mg/dL 9 9  CREATININE mg/dL 9.15 9.17  GLUCOSE mg/dL 826* 482*  CALCIUM  mg/dL 8.2* 8.0*    Results from last 2 days  Lab Units 01/15/24 0253  MAGNESIUM  mg/dL 1.6      Marinell Ollis Snider, MD

## 2024-01-15 NOTE — Progress Notes (Signed)
 Case Management Initial Assessment Note Patient Information: DOB:1980/08/07 Gender:female Admission Date: 01/14/2024 Primary Care Provider: Garnette DELENA Olmsted, MD Preferred Pharmacy: [id number] [place], [place] - [address] AT [facility] [address] - PHONE: [phone] - FAX: [phone]   Unplanned Readmission Score:  19.72  Assessment discussed with:  Name: Alan & Adam Relationship to Patient: Patient & Spouse Phone Number:[phone] Date: Sun 01/15/2024  Assessment conducted via: Bedside  Payor Verification: Payor verified and no changes needed   Extended Emergency Contact Information Primary Emergency Contact: Critz,Adam Address: [address]          [place], [place] [address] [place] Mobile Phone: [phone] Relation: Spouse Secondary Emergency Contact: Smith,Dottie Mobile Phone: [phone] Relation: Friend Guardian Information: N/A  Current Agencies or Facilities: None  Anticipated Discharge Plan: Patient admitted with decompensated hepatic cirrhosis, hepatology following for liver transplant workup. Prior to admission, patient resides at home with family in [place], [place]. Independent in adls. No active services or DME use. Family provides transportation assistance. PCP, insurance, and preferred pharmacy verified. CCM provided [employer] to family if needed.   Anticipated Discharge Location: Home  If Plan A discharging location is not feasible: Potential Plan B: Home Living Assessment: Discharge Planning Home Living  Assessment  Prior Living Situation: Single family Lives With: Spouse Home Support or Services : Independent/No support, Family Care Barriers at Home : No Barriers  Home Social Assessment: In anticipation of future discharge needs the patient/caregiver indicates: Do you have a way to obtain medications after you leave the hospital? : Yes Do you have a way to pay for medications after  you leave the hospital? : Yes Do you have a good supply of food at home? : Yes Do you have a ride to doctors appointment after you leave the hospital? : Yes   Anticipated Discharge Needs Estimated Date of Discharge: Jan 23, 2024     Home Support or Services : Independent/No support, Family Care Can be cared for in same environment prior to hospitalization: Yes            Discharge Barriers Discharge Barriers Identified  : No Barriers Identified  Assessment Completed by: Powell DELENA Sharps, MSW

## 2024-01-15 NOTE — Progress Notes (Signed)
 [facility] - PROGRESS NOTE  SUBJECTIVE:   Patient's cousin Powell is at the bedside.  Patient's husband Juliene is at bedside.  Overnight noted severe cramping in her legs.  She is on baclofen twice a day..  Some improvement with placement of SCDs    PHYSICAL EXAM:    Vitals:   01/15/24 0435  BP: (!) 116/47  Pulse: (!) 113  Resp: 18  Temp: 98.7 F (37.1 C)  SpO2: 97%    Intake/Output Summary (Last 24 hours) at 01/15/2024 9347 Last data filed at 01/15/2024 0400 Gross per 24 hour  Intake 960 ml  Output 1900 ml  Net -940 ml    General: Chronic ill-appearing female who is alert and oriented resting comfortably in bed HEENT: Anicteric pupils equal and round Heart: Regular rate and rhythm. Lungs: Clear nonlabored breathing Abdomen: Markedly distended with ascites but is soft and nontender to palpation.  Bowel sounds noted Neurologic: no asterixis on exam; answering questions appropriately Psych: Appropriate mood and affect  MEDICATIONS:   Current Medications[1]    LABS/IMAGING:    Lab Results  Component Value Date   WBC 3.08 (L) 01/15/2024   HGB 8.4 (L) 01/15/2024   HCT 26 (L) 01/15/2024   MCV 89 01/15/2024   PLT 63 (L) 01/15/2024    Lab Results  Component Value Date   INR 1.9 01/15/2024   INR 1.9 01/14/2024   INR 2.0 01/02/2024   PROTIME 21.6 (H) 01/15/2024   PROTIME 21.7 (H) 01/14/2024   PROTIME 23.0 (H) 01/02/2024    Lab Results  Component Value Date   GLUCOSE 173 (H) 01/15/2024   CALCIUM  8.2 (L) 01/15/2024   NA 131 (L) 01/15/2024   K 3.4 (L) 01/15/2024   CO2 25 01/15/2024   CL 97 (L) 01/15/2024   BUN 9 01/15/2024   CREATININE 0.84 01/15/2024    Lab Results  Component Value Date   BILITOT 2.8 (H) 01/15/2024   BILITOT 3.5 (H) 11/18/2023   BILIDIR 1.2 (H) 01/15/2024   BILIDIR 1.56 (H) 11/18/2023   ALBUMIN  2.9 (L) 01/15/2024   AST 35 01/15/2024   AST 40 11/18/2023   ALT 19 01/15/2024   ALT 28 11/18/2023   PROT 6.1 (L) 01/15/2024     MELD 3.0: 22 at 01/15/2024  2:53 AM MELD-Na: 22 at 01/15/2024  2:53 AM Calculated from: Serum Creatinine: 0.84 mg/dL (Using min of 1 mg/dL) at 89/07/7972  7:46 AM Serum Sodium: 131 mmol/L at 01/15/2024  2:53 AM Total Bilirubin: 2.8 mg/dL at 89/07/7972  7:46 AM Serum Albumin : 2.9 g/dL at 89/07/7972  7:46 AM INR(ratio): 1.9 at 01/15/2024  2:53 AM Age at listing (hypothetical): 42 years Sex: Female at 01/15/2024  2:53 AM      ASSESSMENT/PLAN:    Laurie Sutton is a 43 y.o. with decompensated MASH cirrhosis (MELD 3.0 score 23, Blood Group O)  being evaluated for liver transplant c/b nonbleeding esophageal varices, ascites and hepatic encephalopathy.   She is transferred from OSH after becoming lethargic and mental status changes during outpatient paracentesis on 10/3.     # Decompensated MASH cirrhosis (MELD 3.0 score 22, Blood Group O)   Undergoing liver transplant evaluation and still needs the following items completed which we will obtain during this admission:   -CT coronary scan ordered on 10/5   -cardiology evaluation given she had acute CHF earlier this year in the setting of colitis and sepsis,    -dental clearance    - surgeon evaluation  HCC Screening: no liver lesions on outside MRI August 2025; she also had CT abdomen[facility]pelvis on [facility]/06/2024 at [facility]     #Ascites: - Continue her home dosing of furosemide  80 mg daily and spironolactone  150 mg daily.   - s/p 6.3 liter para at OSH on 10/3   - no SBP by cell count on 9/30   - para as needed   - 2 gram sodium diet   -No renal issues.   # Hepatic encephalopathy   -Continue with lactulose  titrated to have 4 bowel movements daily and Xifaxan  550 mg twice a day   -Please limit pain medications, sedatives, sleep medication     #Nonbleeding esophageal varices - Not on a beta-blocker given history of hypotension.   - Last endoscopy April 2025 with grade 2 esophageal varices and portal hypertensive  gastropathy   #Hyponatremia: - stable Na of 131 on 10/5;  monitor; for now will continue current diuretic dosing of furosemide  80 mg day and spironolactone  150 mg day   #Hypotension: - On midodrine  10 mg 3 times daily at home.  Will continue during this admission.  BP currently 116/47   #Cramping - on baclofen 10 mg bid; if need could increase up to tid     For any questions or concerns please send a message to [employer] [facility] Liver Hepatology Consult 1st Call  [name]    10/5/20256:52 AM     [facility]    Hepatology attending I have seen and examined the patient and discussed the patient with the APP. I performed the key elements of the history and physical exam. She is lying in bed sleepy but arousable. She has had cramping and on baclofen. Recommend repleting magnesium  as well. Will undergon liver transplant evaluation. Family updated at bedside.       [1]  Current Facility-Administered Medications:  .  acetaminophen  (TYLENOL ) tablet 650 mg, 650 mg, oral, Q6H PRN, Tamera Martin, PA .  baclofen (LIORESAL) tablet 10 mg, 10 mg, oral, BID PRN, Tamera Martin, PA, 10 mg at 01/15/24 0445 .  dextrose  (D50W) 50 % injection 12.5 g, 12.5 g, intravenous, PRN, Sherrilyn Lunger, PA .  dextrose  (GLUTOSE) 40 % oral gel 15 g, 15 g, oral, PRN, Tamera Martin, PA .  diphenhydrAMINE  (BENADRYL ) tablet/capsule 25 mg, 25 mg, oral, Q8H PRN, Tamera Martin, PA .  FLUoxetine  (PROzac ) capsule 40 mg, 40 mg, oral, Daily, Tamera Martin, PA, 40 mg at 01/14/24 1512 .  furosemide  (LASIX ) tablet 80 mg, 80 mg, oral, Daily, Tamera Martin, PA, 80 mg at 01/14/24 1512 .  HYDROmorphone  (DILAUDID ) 1 mg/mL injection 0.5 mg, 0.5 mg, intravenous, Q4H PRN, Tamera Martin, PA, 0.5 mg at 01/14/24 1955 .  hydrOXYzine  (ATARAX ) tablet 10 mg, 10 mg, oral, Q6H PRN, Tamera Martin, PA, 10 mg at 01/14/24 1528 .  insulin  glargine (LANTUS ) injection 9 Units, 9 Units, subcutaneous, Daily, Tamera Martin, PA .  insulin   lispro (HumaLOG ) injection 0-15 Units, 0-15 Units, subcutaneous, Q4H, Tamera Martin, PA, 5 Units at 01/15/24 0441 .  lactulose  (CHRONULAC ) 10 gram/15 mL solution 20 g, 20 g, oral, TID, Sherrilyn Lunger, PA, 20 g at 01/15/24 0442 .  LORazepam  (ATIVAN ) tablet 0.5 mg, 0.5 mg, oral, At Bedtime PRN, Sherrilyn Lunger, PA, 0.5 mg at 01/15/24 0143 .  metoclopramide (REGLAN) injection 5 mg, 5 mg, intravenous, Q6H PRN, Tamera Martin, PA .  midodrine  (PROAMATINE ) tablet 10 mg, 10 mg, oral, TID AC, Tamera Martin, PA, 10 mg at 01/14/24 1512 .  montelukast  (SINGULAIR ) tablet 10 mg, 10 mg, oral, At Bedtime, Sherrilyn Lunger, PA, 10 mg at 01/14/24 2022 .  naloxone (NARCAN) injection 0.1 mg, 0.1 mg, intramuscular, Q10 Min PRN, Sherrilyn Lunger, PA .  naloxone (NARCAN) injection 0.2 mg, 0.2 mg, intravenous, Q2min PRN, Sherrilyn Lunger, PA .  naloxone (NARCAN) injection 0.4 mg, 0.4 mg, intravenous, Once PRN, Tamera Martin, PA .  omeprazole  (PriLOSEC) DR capsule 40 mg, 40 mg, oral, Daily 0600, Tamera Martin, PA, 40 mg at 01/15/24 0441 .  ondansetron  (ZOFRAN ) injection 4 mg, 4 mg, intravenous, Q6H PRN, Tamera Martin, PA, 4 mg at 01/14/24 1424 .  prochlorperazine  (COMPAZINE ) injection 10 mg, 10 mg, intravenous, Q6H PRN, Tamera Martin, PA .  rifAXIMin  (XIFAXAN ) tablet 550 mg, 550 mg, oral, BID, Sherrilyn Lunger, PA, 550 mg at 01/14/24 2022 .  senna (SENOKOT) tablet 8.6 mg, 1 tablet, oral, Daily, Sherrilyn Lunger, PA, 8.6 mg at 01/14/24 1512 .  spironolactone  (ALDACTONE ) tablet 150 mg, 150 mg, oral, Daily, Sherrilyn Lunger, PA, 150 mg at 01/14/24 1512

## 2024-01-15 NOTE — Care Plan (Signed)
  Problem: Safety - Adult Goal: Absence of infection during hospitalization Description: INTERVENTIONS: 1. Assess and monitor for signs and symptoms of infection 2. Monitor lab/diagnostic results 3. Monitor all insertion sites i.e., indwelling lines, tubes and drains 4. Monitor endotracheal (as able) and nasal secretions for changes in amount and color 5. Institute appropriate cooling/warming therapies per order 6. Administer medications as ordered 7. Instruct and encourage patient and family to use good hand hygiene technique 8. Identify and instruct in appropriate isolation precautions for identified infection/condition Outcome: Progressing   Problem: Chronic Conditions and Co-Morbidities Goal: Patient's chronic conditions and co-morbidity symptoms are monitored and maintained or improved Description: INTERVENTIONS: 1. Monitor and assess patient's chronic conditions and comorbid symptoms for stability, deterioration, or improvement 2. Collaborate with multidisciplinary team to address chronic and comorbid conditions and prevent exacerbation or deterioration 3. Update acute care plan with appropriate goals if chronic or comorbid symptoms are exacerbated and prevent overall improvement and discharge Outcome: Progressing   Problem: Knowledge Deficit Goal: Patient/family/caregiver demonstrates understanding of disease process, treatment plan, medications, and discharge instructions Description: Complete learning assessment and assess knowledge base. Outcome: Progressing   Problem: Compromised Skin Integrity Goal: Skin integrity is maintained or improved Description: Assess and monitor skin integrity. Identify patients at risk for skin breakdown on admission and per policy. Collaborate with interdisciplinary team and initiate plans and interventions as needed.    Outcome: Progressing Goal: Fluid and electrolyte balance are achieved/maintained Description: Assess and monitor vital signs  (orthostatic vitals if applicable), fluid intake and output, urine color, labs, skin turgor, mucous membranes, jugular venous distention, edema, circumference of edematous extremities and abdominal girth, respiratory status, and mental status.  Monitor for signs and symptoms of hypovolemia (tachycardia, rapid breathing, decreased urine output, postural hypotension, confusion, syncope).  Monitor for signs and symptoms of hypervolemia (strong rapid pulse, shortness of breath, difficulty breathing lying down, crackles heard in lung fields, edema). Collaborate with interdisciplinary team and initiate plan and interventions as ordered. Outcome: Progressing Goal: Nutritional status is improving Description: Monitor and assess patient for malnutrition (ex- brittle hair, bruises, dry skin, pale skin and conjunctiva, muscle wasting, smooth red tongue, and disorientation). Collaborate with interdisciplinary team and initiate plan and interventions as ordered.  Monitor patient's weight and dietary intake as ordered or per policy. Utilize nutrition screening tool and intervene per policy. Determine patient's food preferences and provide high-protein, high-caloric foods as appropriate.  Outcome: Progressing   Problem: Urinary Incontinence Goal: Perineal skin integrity is maintained or improved Description: Assess genitourinary system, perineal skin, labs (urinalysis), and history of incontinence to include past management, aggravating, and alleviating factors.  Collaborate with interdisciplinary team and initiate plans and interventions as needed. Outcome: Progressing   Problem: Pain Goal: Improvement in pain assessment Outcome: Progressing

## 2024-01-16 NOTE — Care Plan (Signed)
 Problem: Knowledge Deficit  Goal: Patient/family/caregiver demonstrates understanding of disease process, treatment plan, medications, and discharge instructions  Description: Complete learning assessment and assess knowledge base.  Outcome: Progressing

## 2024-01-17 NOTE — Progress Notes (Addendum)
 [facility] Dentistry: Inpatient Consult Note  01/17/2024  Patient Name: Laurie Sutton DOB:  December 28, 1980 MRN: [id number] Admitting Team: [facility] DENTAL [facility]   Team's reason for dental consult: Dental clearance prior to Liver transplant  Laurie Sutton is a 43 y.o. female who was admitted to [facility] on (Not on file) for Encounter for pre-transplant evaluation for liver transplant [Z01.818].   HPI:  Laurie Sutton is a 43 y.o. 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 Last dental visit: June 2025  for surgical extraction of tooth #7. Patient is asymptomatic in terms of pain from dental origin.   Review of systems:  ENT ROS: negative  Cardiovascular ROS: no chest pain or dyspnea on exertion Respiratory ROS: no cough, shortness of breath, or wheezing  Musculoskeletal ROS: negative  Gastrointestinal ROS: positive for - ascites  Genito-Urinary ROS: negative  Endocrine ROS: negative  Dermatological ROS: negative  Hematological and Lymphatic ROS: negative  Neurological ROS: no TIA or stroke symptoms  Psychiatric: Cooperative, appropriate mood & affect given setting  Patient reports no dental pain at present.   All other systems have been reviewed and are negative, except for the above noted history.   Problem List[1]   Medical History[2]   Family History[3]   Patient  reports that she has never smoked. She has never used smokeless tobacco. She reports that she does not drink alcohol and does not use drugs.   Allergies[4]  Prior to Admission medications  Medication Sig Start Date End Date Taking? Authorizing Provider  baclofen (LIORESAL) 10 mg tablet Take 1 tablet (10 mg total) by mouth 2 (two) times a day as needed for muscle spasms. 01/12/24 02/11/24  Dawn A Drazek, NP  blood-glucose meter (Accu-Chek Guide Me Glucose Mtr) misc 1 each.  02/01/22   HISTORICAL PROVIDER, CONVERSION  blood-glucose sensor (FreeStyle Libre 3 Sensor) 1 each.    HISTORICAL PROVIDER, CONVERSION  Dexcom G7 Sensor 1 each. 06/26/23   HISTORICAL PROVIDER, CONVERSION  ergocalciferol  (VITAMIN D2) 1,250 mcg (50,000 unit) capsule Take 50,000 Units by mouth once a week. 08/23/23   HISTORICAL PROVIDER, CONVERSION  FLUoxetine  (PROzac ) 40 mg capsule Take 40 mg by mouth daily. 02/22/22   HISTORICAL PROVIDER, CONVERSION  furosemide  (LASIX ) 20 mg tablet Take 4 tablets (80 mg total) by mouth daily. 10/26/23 10/25/24  Dawn A Drazek, NP  glucose blood (Accu-Chek Guide test strips) test strip 1 each by Other route as needed. 02/01/22   HISTORICAL PROVIDER, CONVERSION  HYDROmorphone  (DILAUDID ) 2 mg tablet Take 1 mg by mouth every 6 (six) hours as needed for severe pain (7-10). 01/06/24   HISTORICAL PROVIDER, CONVERSION  insulin  glargine (LANTUS  SoloStar) 100 unit/mL (3 mL) pen Inject 9 Units under the skin. 01/11/24   HISTORICAL PROVIDER, CONVERSION  lactulose  (CHRONULAC ) 10 gram/15 mL solution Take 30 mL (20 g total) by mouth 3 (three) times a day. 07/12/23 07/11/24  Dawn A Drazek, NP  LORazepam  (ATIVAN ) 0.5 mg tablet Take 0.5 mg by mouth at bedtime.    HISTORICAL PROVIDER, CONVERSION  midodrine  (PROAMATINE ) 10 mg tablet Take 1 tablet (10 mg total) by mouth 3 (three) times a day. 01/12/24 07/10/24  Dawn A Drazek, NP  montelukast  (SINGULAIR ) 10 mg tablet Take 10 mg by mouth at bedtime.    HISTORICAL PROVIDER, CONVERSION  morphine  (MSIR) 15 mg tablet Take 7.5 mg by mouth every 6 (six) hours as needed for severe  pain (7-10). 01/06/24   HISTORICAL PROVIDER, CONVERSION  NovoLOG  Flexpen U-100 Insulin  100 unit/mL (3 mL) pen Inject 0-6 Units under the skin See Admin Instructions. BG< 150= 0 units 150-200: 1 unit 201-250: 2 units 251-300: 3 units 301-350: 4 units 351-400: 5 units BG > 400: 6 units and Call Primary Care 01/11/24   HISTORICAL PROVIDER, CONVERSION  omeprazole  (PriLOSEC) 40 mg DR  capsule Take 40 mg by mouth daily. 07/04/19   HISTORICAL PROVIDER, CONVERSION  ondansetron  (ZOFRAN -ODT) 4 mg disintegrating tablet Dissolve 1 tablet on tongue every 8 (eight) hours as needed. 03/09/22   HISTORICAL PROVIDER, CONVERSION  prochlorperazine  (COMPAZINE ) 10 mg tablet Take 10 mg by mouth every 8 (eight) hours as needed. 12/29/23   HISTORICAL PROVIDER, CONVERSION  rifAXIMin  (XIFAXAN ) 550 mg tablet Take 1 tablet (550 mg total) by mouth 2 (two) times a day. 10/26/23 10/25/24  Dawn A Drazek, NP  rOPINIRole  (REQUIP ) 1 mg tablet Take 1 mg by mouth at bedtime. 01/03/24   HISTORICAL PROVIDER, CONVERSION  spironolactone  (ALDACTONE ) 50 mg tablet Take 3 tablets (150 mg total) by mouth daily. 10/26/23 10/25/24  Dawn A Drazek, NP      LABS:   CBC brief  Recent Labs    01/17/24 0430  WBC 2.23*  2.23*  RBC 2.59*  2.59*  HGB 7.6*  7.6*  HCT 23*  23*  PLT 63*  63*    Lab Results  Component Value Date   WBC 2.23 (L) 01/17/2024   WBC 2.23 (L) 01/17/2024   HGB 7.6 (L) 01/17/2024   HGB 7.6 (L) 01/17/2024   HCT 23 (L) 01/17/2024   HCT 23 (L) 01/17/2024   MCV 90 01/17/2024   MCV 90 01/17/2024   PLT 63 (L) 01/17/2024   PLT 63 (L) 01/17/2024     Vitals: BP Readings from Last 1 Encounters:  01/17/24 (!) 93/41    Pulse: 63  Height: 1.676 m (5' 6) (01/17/2024  3:09 PM)  Body mass index is 28.08 kg/m.   Assessment: General disposition: Stable and alert. Hospital admission for liver transplant evaluation. Respiratory: Non-labored, normal appearing Head: Normocephalic, atraumatic Psychiatric: Cooperative, appropriate mood & affect given setting  Extraoral exam: negative for swelling, tenderness, lymphadenopathy, trauma, and cellulitis Intraoral exam: negative  intraoral swelling, bleeding, purulence, and erythema;           Clinical Findings:    Review/Management: Panoramic radiograph and full mouth series was evaluated, revealing no dental carious lesion or any other  intra-oral acute peri-apical lesion.     Dental Diagnosis: The following clinical findings were noted:  Tooth #2,3,4 and 19: Asymptomatic. Endodontically treated with no PARL. Full coverage restorations PFM.  Missing teeth: #30, 5,6,13,14,15,16,1,17,18 and 32. Oral hygiene: Fair Caries risk: High  Impression/Plan: Hospital Dentistry was consulted by the primary team to see 43 y.o. female for dental clearance prior to liver transplant evaluation. There was no evidence of secondary caries or any peri-apical radiolucencies that were concerning to the team.  There was no acute sign of infection or fluid collection in regards to the present dentition. Patient was asymptomatic in terms of odontogenic pain.  After chart review, review of available imaging, clinical evaluation and dental exam, the patient would benefit from discussion at case conference on 10/9.  Case conference recommendations from 01/20/2024:  The patient was extensively discussed at the case conference and it was determined to be cleared from dental standpoint prior to her liver transplant. No further dental intervention required.   Dentistry signing off.  Please [facility] Dentistry 1st Call with further questions and/or concerns.  Resident [facility] Resident PGY-1   Hospital Dentistry Attending: Dr. Lamar Plank  NOTE TO PATIENT: The 21st Century Cures Act requires that medical notes like this be available to patients in interest of transparency. However, be advised this is a medical document. It is intended as peer to peer communication. It is written in medical language and may contain abbreviations or verbiage that are unfamiliar. It may appear blunt or direct. Medical documents are intended to carry relevant information, facts as evident, and the clinical opinion of the practitioner.       [1] Patient Active Problem List Diagnosis  . Diabetes mellitus without complication    (CMD)  . GERD  (gastroesophageal reflux disease)  . Other cirrhosis of liver  . Metabolic dysfunction-associated steatohepatitis (MASH)  . Portal hypertension with esophageal varices    (CMD)  . ADHD  . Anxiety  . Depression  . Diabetic neuropathy    (CMD)  . Other ascites  . Mild protein-calorie malnutrition (CMD)  . Hepatic encephalopathy    (CMD)  . Neoplasm of uncertain behavior of liver  . Hypotension, unspecified  . Muscle cramps  . Hx of C diff  . Restless leg syndrome  . Diastolic dysfunction  . Decompensated hepatic cirrhosis    (CMD)  . Abdominal pain  . Fever  . Thrombocytopenia  . Hypokalemia  [2] Past Medical History: Diagnosis Date  . Allergy   . Anxiety   . Diabetes mellitus   . GERD (gastroesophageal reflux disease)   . Hypertension   . Migraines   [3] Family History Problem Relation Name Age of Onset  . COPD Mother    . Depression Mother    . Anxiety disorder Mother    . Thrombocytopenia Mother    . Brain cancer Mother    . Diabetes Father    . Depression Father    . Anxiety disorder Father    . Bladder Cancer Father    . Rheum arthritis Father    . Asthma Daughter    . Anxiety disorder Daughter    . Asthma Son    . Anxiety disorder Son    . Diabetes Maternal Aunt    . Cirrhosis Maternal Aunt    . Diabetes Maternal Grandmother    . Diabetes Maternal Grandfather    . Diabetes Paternal Grandfather    . Liver cancer Paternal Grandfather    [4] Allergies Allergen Reactions  . Penicillins Hives and Itching    Has patient had a PCN reaction causing immediate rash, facial/tongue/throat swelling, SOB or lightheadedness with hypotension: yes. Rash , Has patient had a PCN reaction causing severe rash involving mucus membranes or skin necrosis: Yes- rash and hives all over body , Has patient had a PCN reaction that required hospitalization No, Has patient had a PCN reaction occurring within the last 10 years: Yes , If all of the above answers are NO, then may  proceed with Cephalosporin use.  . Vancomycin  Itching  . Azithromycin  Other (See Comments)    DOES NOT WORK  . Empagliflozin -Metformin  Swelling  . Latex Other (See Comments)  . Metformin      Other Reaction(s): achy all over  . Morphine  Itching    Severe  . Semaglutide  Other (See Comments)  . Sitagliptin  Arthralgias and Other (See Comments)    Body aches all day, Other reaction(s): Arthralgia (Joint Pain)  . Dulaglutide  GI Intolerance    constipation  and stomach issues  JANUVIA  STOAMCH HURT?

## 2024-01-19 NOTE — Progress Notes (Signed)
 Case Management Final Discharge Note Patient Information: Name: Bryna Razavi  DOB:1981/03/16 Gender:female MRN: [id number]  Admission Date: 01/14/2024 Discharge Date:No discharge date for patient encounter. Primary Care Provider: Garnette DELENA Olmsted, MD Preferred Pharmacy: [facility] [phone] - [place], [place] - [address] AT [facility] [facility] [address] - PHONE: [phone] - FAX: [phone]   Unplanned Readmission Score:  20.99 Discharge address: [address] [place] [address] Discharge Transportation: spouse  Discharge Barriers Discharge Barriers Identified  : No Barriers Identified  Discharge Plan Discharge Disposition: Home or Self Care          Anticipated Discharge Location: Home  If Plan A discharging location is not feasible: Potential Plan B: Home    Discharge Readiness:  Discharge Plan was discussed with Patient, Family Any concerns with discharge plan: No concerns verbalized with current anticipated discharge plan         Final Destination Choice:    Final Assessment Narrative: Pt with discharge orders. PT recommended home with intermittent supervision. No skilled needs identified at this time. Pt's spouse to assist with transportation home. CCM remains available.      Post Acute Referral Placement   No notes of this type exist for this encounter.    Assessment Completed by: Ammon JONELLE Grave, RN

## 2024-01-19 NOTE — Progress Notes (Signed)
 [facility] - PROGRESS NOTE  SUBJECTIVE:  Sitting up in bed and also  brushing teeth this am  Has abn pain .SABRA Gas like inc with lactulose    PHYSICAL EXAM:    Vitals:   01/19/24 1146  BP: (!) 113/42  Pulse: 75  Resp: 18  Temp: 97.8 F (36.6 C)  SpO2: 99%    Intake/Output Summary (Last 24 hours) at 01/19/2024 1151 Last data filed at 01/19/2024 1136 Gross per 24 hour  Intake 720 ml  Output 1 ml  Net 719 ml    PHYSICAL EXAM: General:  in no apparent distress. Head: Normocephalic, atraumatic. Eye: Normal conjunctiva, extraocular motion intact, anicteric sclera. ENT, moist mucous membranes. Cardiovascular: Regular rate, normal rhythm,, no edema  Respiratory: normal respiratory effort,  Abdomen :  soft distended ascites no HSM  no rebound tenderness,  Muscular skeletal : moves all  extremities  muscle atrophy  Neurologic:  no asterixis  no focal deficits  Psychiatric: Cooperative, appropriate affect. Skin: no rashes on exposed areas     MEDICATIONS:   Current Medications[1]   ALLERGIES:    Allergies[2]  LABS/IMAGING:   I have reviewed the pertinent labs and imaging.   Lab Results  Component Value Date   WBC 2.25 (L) 01/19/2024   HGB 7.8 (L) 01/19/2024   HCT 24 (L) 01/19/2024   MCV 90 01/19/2024   PLT 67 (L) 01/19/2024    Lab Results  Component Value Date   INR 1.6 01/19/2024   INR 1.8 01/17/2024   INR 1.8 01/16/2024   PROTIME 19.1 (H) 01/19/2024   PROTIME 20.6 (H) 01/17/2024   PROTIME 20.9 (H) 01/16/2024    Lab Results  Component Value Date   GLUCOSE 214 (H) 01/19/2024   CALCIUM  8.2 (L) 01/19/2024   NA 132 (L) 01/19/2024   K 4.5 01/19/2024   CO2 22 01/19/2024   CL 105 01/19/2024   BUN 11 01/19/2024   CREATININE 0.74 01/19/2024    Lab Results  Component Value Date   BILITOT 2.2 (H) 01/19/2024   BILITOT 3.5 (H) 11/18/2023   BILIDIR 0.8 (H) 01/19/2024   BILIDIR 1.56 (H) 11/18/2023   ALBUMIN  3.2 (L) 01/19/2024   AST 33  01/19/2024   AST 40 11/18/2023   ALT 15 01/19/2024   ALT 28 11/18/2023   PROT 6.0 (L) 01/19/2024    MELD 3.0: 19 at 01/19/2024  9:43 AM MELD-Na: 19 at 01/19/2024  9:43 AM Calculated from: Serum Creatinine: 0.74 mg/dL (Using min of 1 mg/dL) at 89/0/7974  7:61 AM Serum Sodium: 132 mmol/L at 01/19/2024  2:38 AM Total Bilirubin: 2.2 mg/dL at 89/0/7974  7:61 AM Serum Albumin : 3.2 g/dL at 89/0/7974  7:61 AM INR(ratio): 1.6 at 01/19/2024  9:43 AM Age at listing (hypothetical): 42 years Sex: Female at 01/19/2024  9:43 AM    FIB-4 Calculation: 5.67 at 01/16/2024  2:24 AM Calculated from: SGOT/AST: 34 U/L at 01/16/2024  2:24 AM SGPT/ALT: 15 U/L at 01/16/2024  2:24 AM Platelets: 65 10*3/uL at 01/16/2024  2:24 AM Age: 75 years   No results found for: CAP, E, IQR    US  Guided Abdominal Paracentesis Narrative: DATE OF SERVICE: 01/16/2024  EXAM:  ULTRASOUND GUIDED PARACENTESIS  CLINICAL HISTORY: Ascites send for cell count culture  TECHNIQUE: Sonographic guidance was utilized for this procedure.  Performing provider: Rea Fetch, PA-C  Supervising provider: [name], MD  FINDINGS: Specimen sent for lab: Yes.  Complication: No immediate complication.  The risks and benefits of the procedure  were explained to the patient, parent(s) or legal guardian including, but not limited to, bleeding, infection, and damage to adjacent structures. Understanding was expressed and all questions were answered. The consenting party agreed to proceed and witnessed written consent was obtained. A timeout was performed to confirm the patient's identity and procedure.  Sonography was utilized to confirm ascites and select the LL quadrant for aspiration.  The skin was prepped and draped in the usual sterile fashion, and 1 % Lidocaine  was administered locally. Utilizing a 19-gauge Yueh needle, approximately 5000 mL of clear yellow fluid was aspirated. 5L removed per order. Residual ascites noted on  postprocedure imaging.  The patient tolerated the procedure well. Impression: Ultrasound guided paracentesis.  THIS IS AN ELECTRONICALLY VERIFIED FINAL REPORT 01/17/2024 8:23 AM - Electronically signed by [name]  Workstation: [id number] [facility]    ASSESSMENT/PLAN:    Laurie Sutton is a 43 y.o. 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 , ascites non bleeding varices who admitted for transplant eval   - decomp cirrhosis  MASLD  risk factors DMII, obesity   Decomp factors ascites HE nonbleeding varices  Being eval for transplant MELD 19  BG O Echo preserved EF  diastolic dysfunction  Normal coronaries on CT scan  Had Mammo out patient  Dental eval completed   Discussed at transplant selection approved for listing awaiting insurance approval for listing   -Ascites   With low BP  Reduce diuretics  40/100  Did not tolerate due to hypotension Diuretics last  10/7  Parcentesis  10/6 no SBP    Low sodium diet  Will need para outpatient for symptom management  Limit 5-6  liters  with IV albumin  25 gms weekly    -He  Continue lactulose  and rifaximin   Lactulose  could be contributing to gas and abd pain  Decrease lactulose  to BID and add miralax  daily  Needs 3-4 BM daily  Avoid benzos and narcotics ( from chart review she has been on ativan , and requip  also noted Would avoid these given hx of HE  AMS )  Has hx of anxiety  encourage outpatient counseling and  Has palliative care consult outpatient next week   -hypotension  Due to underlying liver disease  Continue midodrine  15 mg tid  BP improved    -nonbleeding varices  07/2023  Small varices nonbleeding noted  -muscle cramping  Mag given  Muscle cramping improved with Mag and  with out diuretics  Baclofen d/c due to drowsiness    Insomnia Patient states she takes Ativan  at night to help her sleep  and is napping during the day  Recommended she d/c Ativan   Discouraged day time naps   If naps needed take very short naps 15-20 min only    Agree  with d/c home and will arrange follow up     This patient was seen, discussed, and examined by attending who agrees with assessment and plan. See Addendum.   For any questions or concerns please send a message to [facility] Liver Hepatology Consult 1st Call  Rock Daring Nutt    10/9/202511:51 AM     [facility]       Hepatology attending I have seen and examined the patient and discussed the patient with the APP. I performed the key elements of the history and physical exam. She would like to go home. She does not have asterixis or hepatic encephalopathy. We stopped diuretics due to hypotension and she is on midodrine , may have  had cramping from diuretics as well and magnesium  as helped. Ok to DC. She has been approved to list for liver transplant.       [1]  Current Facility-Administered Medications:  .  acetaminophen  (TYLENOL ) tablet 650 mg, 650 mg, oral, Q6H PRN, Jack Samir Yazbeck, MD, 650 mg at 01/19/24 0252 .  calcium  carbonate (TUMS) 500 mg (200 mg calcium ) chewable tablet 500 mg, 500 mg, oral, 4x Daily PRN, Jack Samir Yazbeck, MD, 500 mg at 01/15/24 1322 .  dextrose  (D50W) 50 % injection 12.5 g, 12.5 g, intravenous, PRN, Sherrilyn Lunger, PA .  dextrose  (GLUTOSE) 40 % oral gel 15 g, 15 g, oral, PRN, Tamera Martin, PA .  diphenhydrAMINE  (BENADRYL ) tablet/capsule 25 mg, 25 mg, oral, Q6H PRN, Oluwafunmilayo Fapohunda, MD, 25 mg at 01/18/24 2130 .  enoxaparin  (LOVENOX ) syringe 40 mg, 40 mg, subcutaneous, At Bedtime, Marinell Ollis Snider, MD, 40 mg at 01/18/24 2129 .  ergocalciferol  (VITAMIN D2) capsule 50,000 Units, 50,000 Units, oral, Weekly, Oluwafunmilayo Fapohunda, MD, 50,000 Units at 01/18/24 0935 .  ferrous sulfate 325 mg (65 mg iron) tablet 325 mg, 325 mg, oral, Daily before breakfast, Oluwafunmilayo Fapohunda, MD, 325 mg at 01/19/24 0753 .  FLUoxetine  (PROzac ) capsule 40 mg, 40 mg, oral, Daily, Tamera  Martin, PA, 40 mg at 01/19/24 0753 .  [Held by provider] furosemide  (LASIX ) tablet 40 mg, 40 mg, oral, Daily, Jack Samir Yazbeck, MD .  hydrOXYzine  (ATARAX ) tablet 10 mg, 10 mg, oral, Q6H PRN, Tamera Martin, PA, 10 mg at 01/14/24 1528 .  [Held by provider] insulin  glargine (LANTUS ) injection 9 Units, 9 Units, subcutaneous, Daily, Tamera Martin, PA, 9 Units at 01/16/24 0836 .  insulin  lispro (HumaLOG ) injection 10 Units, 10 Units, subcutaneous, TID PC, Oluwafunmilayo Fapohunda, MD, 10 Units at 01/18/24 1841 .  insulin  NPH (HumuLIN  N, NovoLIN N) injection 17 Units, 17 Units, subcutaneous, BID AC, Oluwafunmilayo Fapohunda, MD, 17 Units at 01/19/24 0753 .  lactulose  (CHRONULAC ) 10 gram/15 mL solution 20 g, 20 g, oral, BID, Rock Daring [place], [place], 20 g at 01/19/24 0753 .  lidocaine  (SALONPAS) 4 % 1 patch, 1 patch, topical, At Bedtime, Lela Douglass Barrio, NP .  metoclopramide (REGLAN) injection 5 mg, 5 mg, intravenous, Q6H PRN, Tamera Martin, PA, 5 mg at 01/16/24 2130 .  midodrine  (PROAMATINE ) tablet 15 mg, 15 mg, oral, TID AC, Linda Brown Nutt, [place], 15 mg at 01/19/24 1141 .  montelukast  (SINGULAIR ) tablet 10 mg, 10 mg, oral, At Bedtime, Sherrilyn Lunger, PA, 10 mg at 01/18/24 2130 .  naloxone (NARCAN) injection 0.1 mg, 0.1 mg, intramuscular, Q10 Min PRN, Sherrilyn Lunger, PA .  naloxone (NARCAN) injection 0.2 mg, 0.2 mg, intravenous, Q2min PRN, Sherrilyn Lunger, PA .  naloxone [facility]) injection 0.4 mg, 0.4 mg, intravenous, Once PRN, Tamera Martin, PA .  omeprazole  (PriLOSEC) DR capsule 40 mg, 40 mg, oral, Daily 0600, Tamera Martin, PA, 40 mg at 01/19/24 0519 .  ondansetron  (ZOFRAN ) injection 4 mg, 4 mg, intravenous, Q6H PRN, Sherrilyn Lunger, PA, 4 mg at 01/19/24 0807 .  oxyCODONE  (ROXICODONE ) immediate release tablet 5 mg, 5 mg, oral, Q6H PRN, Oluwafunmilayo Fapohunda, MD, 5 mg at 01/18/24 2130 .  polyethylene glycol (GLYCOLAX ) packet 17 g, 17 g, oral, Daily, Rock Daring [place], [place], 17 g at 01/18/24 1243 .   rifAXIMin  (XIFAXAN ) tablet 550 mg, 550 mg, oral, BID, Sherrilyn Lunger, PA, 550 mg at 01/19/24 0753 .  senna (SENOKOT) tablet 8.6 mg, 1 tablet, oral, Daily, Sherrilyn Lunger, PA, 8.6 mg at 01/19/24 0753 .  [  Held by provider] spironolactone  (ALDACTONE ) tablet 100 mg, 100 mg, oral, Daily, Jack Samir Yazbeck, MD .  zinc sulfate (ZINCATE) capsule 220 mg, 220 mg, oral, BID, Linda Brown Nutt, [place], 220 mg at 01/19/24 0753 [2] Allergies Allergen Reactions  . Penicillins Hives and Itching    Has patient had a PCN reaction causing immediate rash, facial/tongue/throat swelling, SOB or lightheadedness with hypotension: yes. Rash , Has patient had a PCN reaction causing severe rash involving mucus membranes or skin necrosis: Yes- rash and hives all over body , Has patient had a PCN reaction that required hospitalization No, Has patient had a PCN reaction occurring within the last 10 years: Yes , If all of the above answers are NO, then may proceed with Cephalosporin use.  . Vancomycin  Itching  . Azithromycin  Other (See Comments)    DOES NOT WORK  . Empagliflozin -Metformin  Swelling  . Latex Other (See Comments)  . Metformin      Other Reaction(s): achy all over  . Morphine  Itching    Severe  . Semaglutide  Other (See Comments)  . Sitagliptin  Arthralgias and Other (See Comments)    Body aches all day, Other reaction(s): Arthralgia (Joint Pain)  . Dulaglutide  GI Intolerance    constipation and stomach issues  JANUVIA  STOAMCH HURT?

## 2024-01-19 NOTE — Discharge Summary (Signed)
 Hospital Medicine Discharge Summary   Demographics: Laurie Sutton y.o. 08/08/1980 MRN: [id number]    Extended Emergency Contact Information Primary Emergency Contact: Laurie Sutton,Laurie Sutton Mobile Phone: [phone] Relation: Spouse  Full Code  Admit Date: 01/14/2024                            Attending Physician: Laurie Sutton Estimable* Discharge Date: 01/19/2024  Primary Care Provider: Garnette Sutton Olmsted, MD   [phone]  Consults during this admission: Consult Orders             IP CONSULT TO DENTIST       Specialty:  Dental General Practice  Provider:  (Not yet assigned)      IP CONSULT TO HEPATOLOGY       Provider:  (Not yet assigned)             Active & Resolved Diagnosis: Principal Problem:   Decompensated hepatic cirrhosis    (CMD) Active Problems:   Diabetes mellitus without complication    (CMD)   GERD (gastroesophageal reflux disease)   Other cirrhosis of liver   Portal hypertension with esophageal varices    (CMD)   Anxiety   Depression   Other ascites   Hepatic encephalopathy    (CMD)   Hypotension, unspecified   Muscle cramps   Hx of C diff   Restless leg syndrome   Diastolic dysfunction   Abdominal pain   Fever   Thrombocytopenia   Hypokalemia   Anemia Resolved Problems:   * No resolved hospital problems. *  Disposition: Patient discharged to Home in stable condition.  Discharge follow-up recommendations : Other: Patient advised to follow-up with her primary care physician as well as the liver transplant team as an outpatient within a week.  Scheduled Future Appointments       Provider Department Dept Phone Center   01/26/2024 1:00 PM [facility]ie [facility] [place] [phone]    02/10/2024 12:00 PM Stephane Sutton [facility] - Laurie Sutton [phone]       Hospital Course: 43 y.o. female with history of [name] complicated by esophageal varices,  ascites, hepatic encephalopathy, thrombocytopenia who presented to OSH with hepatic encephalopathy after undergoing outpatient paracentesis .  Patient was admitted for hepatic encephalopathy and fever at an outside hospital.  Patient was transferred for liver transplant evaluation.  She was admitted on 10//2025.  She had a CT coronary which did not show any CAD.  Antibiotics was held over here.  Blood cultures and urine cultures have been negative.  She had a paracentesis [approximately 5 L] on 10/6 .  Patient had blood cultures done and this turned out to be negative.  She was hypotensive and was treated with midodrine  15 mg 3 times a day.  Furosemide  and spironolactone  were held.  Had diabetes was treated with NPH as well as mealtime insulin  and sliding scale.  However she will be discharged home on her home regimen.  Hemoglobin A1c was 6.0.  Patient's iron level was low and she was started on iron sulfate 325 mg p.o. daily.  Patient was eventually placed on the transplant list.  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 - The patient was treated with  xifaxin and lactulose  (.  Titrate lactulose  to 3-4 bowel movements daily - CT coronary ordered, no coronary disease, continue workup per hepatology - diuretics  were held  due to hypotension and the dose of lactulose  reduced to 20 mg TID - Fever -patient reports fever at OSH prior to transfer, recently treated with 7d course of Cipro  for UTI -CXR at OSH without evidence of infection -underwent paracentesis at OSH 10/3 but no cell count, ascitic culture  sent -Blood culture and urine culture show no growth -hold on abx as no recurrent fever and no obvious infection, will start if appropriate -prn Tylenol  for fever - Resolved Abdominal pain -patient reports chronic generalized abd pain, at baseline - Plan for paracentesis on 10/6 ; no organisms seen on fluid culture -The patient was treated with analgesics prn Hypotension, unspecified S/p s needed 25% albumin  as well - Furosemide  and spironolactone  held - The patient was treated with  Midodrine   15 mg 3 times daily - monitor blood pressure Diabetes mellitus without complication    (CMD) -  The patient was treated with  NPH  to 17units [place]  BId units and increase meal time insulin  to 10 units tid. Change insulin  sliding scale  to low dose sliding scale. - The blood glucose was within acceptable range GERD (gastroesophageal reflux disease) - Patient was treated with a proton pump inhibitor  Diastolic dysfunction -TTE 9/22 - LVEF 60%, normal LV diastolic function, RV size and function normal, no ASD/PFO -G2DD on TTE 09/2023 - Hold furosemide  and spironolactone  Thrombocytopenia -Plt 54, at baseline, d/t underlying liver dysfunction Patient has chronic thrombocytopenia. Muscle cramps -Patient was recently started on baclofen however this was held due to oversedation.  She was given it dose of magnesium  and this helped her cramps.  Hx of C diff -recently treated w/dificid  09/2023, no recurrence of c diff after completing abx for UTI 12/2023 Restless leg syndrome - Patient was treated with ropinirole  however baclofen was held  due to oversedation,  Anxiety Depression The patient was treated with fluoxetine  Hypokalemia - Potassium repleted and currently potassium level is within normal range. Anemia Monitor hemoglobin  -  iron level is low and TIBC is low Patient was started on iron sulphate 325mg  po daily         Wound / Incision Assessment: Refer to Chart Review and Media Tab for  images if available.      Patient is new to me today chart reviewed. Patient seen and examined, lying in bed, no respiratory distress, nurse present at bedside, friend present at bedside Overnight: No new events.  Patient has been placed on the liver transplant list.     Sujbecive patient denies any chest pain, no shortness of breath, no nausea, no vomiting, no diarrhea, no abdominal pain, no fever or chills.  Vital Sign Range:  Temp:  [97.8 F (36.6 C)-98.5 F (36.9 C)] 98.5 F (36.9 C) Heart Rate:  [68-78] 77 Resp:  [17-18] 18 BP: (95-121)/(41-55) 114/50   General: No respiratory distress HEENT: Normocephalic/atraumatic, not pale, anicteric, moist oral mucosa, supple neck, no JVD or carotid bruit Chest: Vesicular breath sounds no wheezing or crepitations Cardiovascular: First and second heart sounds present, no murmurs, no gallops, no rubs Abdomen: Distended soft nontender, no organomegaly, bowel sounds are present Extremities: No pitting pedal edema, no clubbing or cyanosis CNS:  Alert oriented x3, power is 5/5 all limbs Psych: Calm.     Discharge Medications     New Medications      Sig Disp Refill Start End  acetaminophen  325 mg tablet Commonly known as: TYLENOL   Take 2 tablets (650 mg total) by mouth every 6 (six) hours as needed for mild pain (1-3) or headaches (Fever GREATER THAN 100.4 F(38 C)).  30 tablet  0     ferrous sulfate 325 mg (65 mg iron) tablet  Take 1 tablet (325 mg total) by mouth daily before breakfast.  30 tablet  0  January 20, 2024    lidocaine  4 % patch Commonly known as: SALONPAS  Apply 1 patch topically at bedtime for 10 days.  10 patch  0     zinc sulfate 220 mg capsule Commonly known as: ZINCATE  Take 1 capsule (220 mg total) by mouth 2 (two) times a day.  60 capsule  0         Modified Medications      Sig Disp Refill Start End  Dexcom G7 Sensor Generic drug: blood-glucose sensor What changed: Another medication with  the same name was removed. Continue taking this medication, and follow the directions you see here.  1 each.   0     lactulose  10 gram/15 mL solution Commonly known as: CHRONULAC  What changed: when to take this  Take 30 mL (20 g total) by mouth 2 (two) times a day for 10 days.  600 mL  0     midodrine  5 mg tablet Commonly known as: PROAMATINE  What changed:  medication strength how much to take when to take this  Take 3 tablets (15 mg total) by mouth in the morning and 3 tablets (15 mg total) at noon and 3 tablets (15 mg total) in the evening. Take before meals.  270 tablet  0         Medications To Continue      Sig Disp Refill Start End  Accu-Chek Guide Me Glucose Mtr Misc Generic drug: blood-glucose meter  1 each.   0     Accu-Chek Guide test strips test strip Generic drug: glucose blood  1 each by Other route as needed.   0     ergocalciferol  1,250 mcg (50,000 unit) capsule Commonly known as: VITAMIN D2  Take 50,000 Units by mouth once a week.   0     FLUoxetine  40 mg capsule Commonly known as: PROzac   Take 40 mg by mouth daily.   0     insulin  glargine 100 unit/mL (3 mL) pen Commonly known as: LANTUS  SoloStar  Inject 9 Units under the skin.   0     montelukast  10 mg tablet Commonly known as: SINGULAIR   Take 10 mg by mouth at bedtime.   0     NovoLOG  Flexpen U-100 Insulin  100 unit/mL (3 mL) pen Generic drug: insulin  aspart U-100  Inject 0-6 Units under the skin See Admin Instructions. BG< 150= 0 units 150-200: 1 unit 201-250: 2 units 251-300: 3 units 301-350: 4 units 351-400: 5 units BG > 400: 6 units and Call Primary Care   0     omeprazole  40 mg DR capsule Commonly known as: PriLOSEC  Take 40 mg by mouth daily.   0     ondansetron  4 mg disintegrating tablet Commonly known as: ZOFRAN -ODT  Dissolve 1 tablet on tongue every 8 (eight) hours as needed.   0     rifAXIMin  550  mg tablet Commonly known as: XIFAXAN   Take 1 tablet (550 mg  total) by mouth 2 (two) times a day.  60 tablet  11     rOPINIRole  1 mg tablet Commonly known as: REQUIP   Take 1 mg by mouth at bedtime.   0         Stopped Medications    baclofen 10 mg tablet Commonly known as: LIORESAL   furosemide  20 mg tablet Commonly known as: LASIX    HYDROmorphone  2 mg tablet Commonly known as: DILAUDID    LORazepam  0.5 mg tablet Commonly known as: ATIVAN    morphine  15 mg tablet Commonly known as: MSIR   prochlorperazine  10 mg tablet Commonly known as: COMPAZINE    spironolactone  50 mg tablet Commonly known as: ALDACTONE        Discharge Orders     Acitivity Instructions:     Details:    Activity Limits: As you are able   Call Provider for (Adult/OB):     Details:    Call Provider For (Adult/OB):  Short of breath Chest pain     Discharge Diet (specify)     Details:    Diet type: Mediterranean Sodium Controlled (less than 2000 mg)   Lifting Limits:     Details:    Lifting Limits: No lifting limits   Ambulatory referral to [facility]     Ambulatory referral to PCP         Physical Therapy Recommendations: Home with intermittent assistance     Lab Results  Component Value Date/Time   HGB 7.8 (L) 01/19/2024 02:38 AM   HGB 11.3 11/18/2023 10:46 AM   HCT 24 (L) 01/19/2024 02:38 AM   HCT 33.9 (L) 11/18/2023 10:46 AM   WBC 2.25 (L) 01/19/2024 02:38 AM   WBC 2.7 (L) 11/18/2023 10:46 AM   PLT 67 (L) 01/19/2024 02:38 AM   Lab Results  Component Value Date/Time   NA 132 (L) 01/19/2024 02:38 AM   NA 133 (L) 11/18/2023 10:45 AM   K 4.5 01/19/2024 02:38 AM   K 2.8 (L) 11/18/2023 10:45 AM   CREATININE 0.74 01/19/2024 02:38 AM   BUN 11 01/19/2024 02:38 AM   BUN 6 11/18/2023 10:45 AM   GLUCOSE 214 (H) 01/19/2024 02:38 AM   GLUCOSE 225 (H) 11/18/2023 10:45 AM    Pertinent Imaging: US  Guided Abdominal Paracentesis  Final Result by Charlie Alm Core, MD (10/07 1256)  DATE OF SERVICE:  01/16/2024    EXAM:     ULTRASOUND GUIDED PARACENTESIS    CLINICAL HISTORY:  Ascites send for cell count culture    TECHNIQUE:  Sonographic guidance was utilized for this procedure.    Performing provider: Rea Fetch, PA-C    Supervising provider: [name], MD    FINDINGS:  Specimen sent for lab: Yes.    Complication: No immediate complication.    The risks and benefits of the procedure were explained to the patient,   parent(s) or legal guardian including, but not limited to, bleeding,   infection, and damage to adjacent structures. Understanding was expressed   and all questions were answered. The consenting party agreed to proceed   and witnessed written consent was obtained. A timeout was performed to   confirm the patient's identity and procedure.    Sonography was utilized to confirm ascites and select the LL quadrant for   aspiration.    The skin was prepped and draped in the usual sterile fashion, and 1 %   Lidocaine  was administered locally.  Utilizing a 19-gauge Yueh needle,   approximately 5000 mL of clear yellow fluid was aspirated. 5L removed per   order. Residual ascites noted on postprocedure imaging.    The patient tolerated the procedure well.    IMPRESSION:  Ultrasound guided paracentesis.    THIS IS AN ELECTRONICALLY VERIFIED FINAL REPORT  01/17/2024 8:23 AM - Electronically signed by [name]   Workstation: [id number]  [facility]    CT Angio Coronary Artery Scan w FFR/Plaque Analysis If Needed  Final Result by Marsha Rosalee Nettle, MD (10/05 1617)  DATE OF SERVICE:  01/15/2024 3:06 pm    EXAM:  CORONARY CT ANGIOGRAM    CLINICAL HISTORY:  Inpatient evaluation.  Preoperative coronary evaluation.    TECHNIQUE:  CT without and with contrast for evaluation of the heart and coronary   arteries:    A low exposure, high pitch non-contrast planning scan of the heart was   first performed. Subsequently, in preparation for CT angiography, a test    bolus with 15-20 mL of contrast was administered with monitoring in the   ascending aorta to determine an appropriate timing delay for imaging.   After administration of contrast for the CTA and using the predicted scan   delay, images were acquired with prospective ECG-gated sequential axial   scanning during limited phases of the cardiac cycle on a [employer]   Source [employer] Force CT. The study was reviewed on a dedicated cardiac   workstation.    TUBE PARAMETERS: 100kV and .  DOSE:  DLP = 548mG y cm.    STUDY MEDICATION(S):  Metoprolol p.o. -75 mg  Nitroglycerin  SL -0.8 mg      CONTRAST:  85mL of Isovue  370 mg I/mL.    HEART RATE: 70bpm.    DIAGNOSTIC QUALITY:  Good.    FINDINGS:  HEART: Normal segmental anatomy. Normal systemic and pulmonary venous   connections. Normal left atrial appendage.    AORTA: The aortic root and visualized portions of the ascending and   descending aorta are normal.    PERICARDIUM: Normal. No pericardial effusion.    ANCILLARY FINDINGS:  For noncardiac findings, refer to separate radiology   report. Ascites present.      CORONARY ARTERIES:    CALCIUM  SCORE: Not formally performed. No coronary artery calcification   detected on noncontrast, low-dose, high pitch/Flash planning scan.    CORONARY ORIGINS AND COURSE:  Normal.    DOMINANCE: Right dominant coronary anatomy.    CORONARY ARTERY ASSESSMENT:    LEFT MAIN: Plaque - None. Stenosis - None.    LEFT ANTERIOR DESCENDING:  Proximal- Plaque - None. Stenosis - None.  Mid- Plaque - None. Stenosis - None.  Distal- Plaque - None. Stenosis - None.  1st  Diagonal- Plaque - None. Stenosis - None.      CIRCUMFLEX:  Proximal- Plaque - None. Stenosis - None.  Distal- Plaque - None. Stenosis - None.  1st  Obtuse Marginal- Plaque - None. Stenosis - None.  2nd Obtuse Marginal- Plaque - None. Stenosis - None.  3rd Obtuse Marginal- Plaque - None. Stenosis - None.  4th Obtuse  Marginal- Plaque - None. Stenosis - None.      RIGHT CORONARY ARTERY:  Proximal- Plaque - None. Stenosis - None.  Mid- Plaque - None. Stenosis - None.  Distal- Plaque - None. Stenosis - None.  Right Posterior Descending- Plaque - None. Stenosis - None.  Right Posterolateral-  Plaque - None. Stenosis - None.    IMPRESSION:  Normal coronary arteries.    *For noncardiac portion of CTA see radiology report. *    THIS IS AN ELECTRONICALLY VERIFIED FINAL REPORT  01/15/2024 4:17 PM - Electronically signed by Marsha Nettle   Workstation: [id number]  [facility]    CT Coronary Chest Noncardiac W Contrast  Final Result by Lynwood Victory Chesley DOUGLAS, MD (10/05 1508)  DATE OF SERVICE:  01/15/2024 3:06 pm    EXAM:  CT coronary chest noncardiac with contrast    CLINICAL HISTORY:  Heart disease    COMPARISON:  None    TECHNIQUE:      Dose lowering technique(s) such as automated exposure control, iterative   reconstruction, and mA and/or KV adjustment for patient size was utilized   for this exam.    FINDINGS:  The visualized portion of the lungs are clear. There are no signs of   mediastinal lymphadenopathy. The thoracic aorta was normal caliber. There   are no pleural effusions. There are no bony abnormalities.    Ascites is seen within the visualized portions of the upper abdomen.    IMPRESSION:  1. No significant extracardiac imaging findings are identified within the   thorax. Ascites is seen within the upper abdomen.    THIS IS AN ELECTRONICALLY VERIFIED FINAL REPORT  01/15/2024 3:08 PM - Electronically signed by Lynwood Chesley   Workstation: [id number]  [facility]      Electronically signed by: Laurie Sutton Estimable, MD 01/19/2024 7:09 PM   Time spent on discharge:45 minutes

## 2024-01-20 ENCOUNTER — Ambulatory Visit (HOSPITAL_COMMUNITY)
Admission: RE | Admit: 2024-01-20 | Discharge: 2024-01-20 | Disposition: A | Discharge status: Home / Self Care | Source: Ambulatory Visit | Attending: Nurse Practitioner | Admitting: Nurse Practitioner

## 2024-01-20 ENCOUNTER — Telehealth: Payer: Self-pay

## 2024-01-20 ENCOUNTER — Other Ambulatory Visit (HOSPITAL_COMMUNITY): Payer: Self-pay | Admitting: Gastroenterology

## 2024-01-20 DIAGNOSIS — K7460 Unspecified cirrhosis of liver: Secondary | ICD-10-CM | POA: Insufficient documentation

## 2024-01-20 DIAGNOSIS — R188 Other ascites: Secondary | ICD-10-CM | POA: Insufficient documentation

## 2024-01-20 DIAGNOSIS — K7581 Nonalcoholic steatohepatitis (NASH): Secondary | ICD-10-CM | POA: Insufficient documentation

## 2024-01-20 HISTORY — PX: IR PARACENTESIS: IMG2679

## 2024-01-20 MED ORDER — ALBUMIN HUMAN 25 % IV SOLN
INTRAVENOUS | Status: AC
  Filled 2024-01-20: qty 200

## 2024-01-20 MED ORDER — LIDOCAINE-EPINEPHRINE 1 %-1:100000 IJ SOLN
20.0000 mL | Freq: Once | INTRAMUSCULAR | Status: AC
  Administered 2024-01-20: 10 mL via INTRADERMAL

## 2024-01-20 MED ORDER — LIDOCAINE-EPINEPHRINE 1 %-1:100000 IJ SOLN
INTRAMUSCULAR | Status: AC
  Filled 2024-01-20: qty 1

## 2024-01-20 MED ORDER — ALBUMIN HUMAN 25 % IV SOLN
37.5000 g | Freq: Once | INTRAVENOUS | Status: AC
  Administered 2024-01-20: 37.5 g via INTRAVENOUS

## 2024-01-20 MED ORDER — ALBUMIN HUMAN 25 % IV SOLN: 12.5000 g | Freq: Once | INTRAVENOUS | Status: DC

## 2024-01-20 NOTE — Procedures (Signed)
 PROCEDURE SUMMARY:  Successful image-guided paracentesis from the left lower abdomen.  Yielded 5.6 liters of clear yellow fluid.  No immediate complications.  EBL: trace Patient tolerated well.   Specimen not sent for labs.  Please see imaging section of Epic for full dictation.  Kimble DEL Barth Trella PA-C 01/20/2024 9:51 AM

## 2024-01-20 NOTE — Transitions of Care (Post Inpatient/ED Visit) (Signed)
 01/20/2024  Name: Laurie Sutton MRN: [id number] DOB: Dec 28, 1980  Today's TOC FU Call Status: Today's TOC FU Call Status:: Successful TOC FU Call Completed TOC FU Call Complete Date: 01/20/24 Patient's Name and Date of Birth confirmed.  Transition Care Management Follow-up Telephone Call Date of Discharge: 01/19/24 Discharge Facility: Other [facility]) Name of Other (Non[facility]) Discharge Facility: [facility] Type of Discharge: Inpatient Admission Primary Inpatient Discharge Diagnosis:: Decompensated hepatic cirrhosis How have you been since you were released from the hospital?: Better Any questions or concerns?: No  Items Reviewed: Did you receive and understand the discharge instructions provided?: Yes Medications obtained,verified, and reconciled?: Yes (Medications Reviewed) Any new allergies since your discharge?: No Dietary orders reviewed?: NA Do you have support at home?: Yes People in Home [RPT]: spouse  Medications Reviewed Today: Medications Reviewed Today     Reviewed by Rumalda Alan PENNER, RN (Registered Nurse) on 01/20/24 at [phone]  Med List Status: <None>   Medication Order Taking? Sig Documenting Provider Last Dose Status Informant  acetaminophen  (TYLENOL ) 325 MG tablet [id number] Yes Take 650 mg by mouth every 6 (six) hours as needed for mild pain (pain score 1-3). [provider]  Active   albuterol  (VENTOLIN  HFA) 108 (90 Base) MCG/ACT inhaler [id number]  INHALE 2 PUFFS INTO THE LUNGS UP TO EVERY 6 HOURS AS NEEDED FOR WHEEZING/SHORTNESS OF BREATH  Patient not taking: Reported on 01/20/2024   [name], MD  Active Self, Pharmacy Records           Med Note [facility], TONIA S   Tue Jan 10, 2024  2:35 AM)    bisacodyl  (DULCOLAX) 5 MG EC tablet [id number]  Take 2 tablets (10 mg total) by mouth daily as needed for moderate constipation.  Patient not taking: Reported on 01/20/2024   Arlice Reichert, MD  Active Self, Pharmacy Records  Blood Glucose  Monitoring Suppl DEVI [id number] Yes 1 each by Does not apply route 3 (three) times daily. May dispense any manufacturer covered by patient's insurance. Rojelio Nest, DO  Active   calcium  carbonate (TUMS - DOSED IN MG ELEMENTAL CALCIUM ) 500 MG chewable tablet [id number]  Chew 2 tablets by mouth as needed for indigestion or heartburn.  Patient not taking: Reported on 01/20/2024   [provider]  Active Self, Pharmacy Records  clotrimazole -betamethasone  (LOTRISONE ) cream [id number]  APPLY 1 APPLICATION TOPICALLY TWICE A DAY AS NEEDED  Patient not taking: Reported on 01/20/2024   [name], MD  Active Self, Pharmacy Records  Continuous Glucose Sensor [facility] G7 [place]) [place] [id number] Yes Use 1 sensor for continuous glucose monitoring every 10 days for 30 days Braulio Hough, MD  Active Self, Pharmacy Records  ferrous sulfate 325 (65 FE) MG EC tablet [id number] Yes Take 325 mg by mouth daily with breakfast. [provider]  Active   FLUoxetine  (PROZAC ) 40 MG capsule [id number] Yes Take 1 capsule (40 mg total) by mouth 2 (two) times daily. [name], MD  Active Self, Pharmacy Records  furosemide  (LASIX ) 80 MG tablet [id number]  Take 1 tablet (80 mg total) by mouth daily.  Patient not taking: Reported on 01/20/2024   Sherrill Alejandro Donovan, DO  Active Self, Pharmacy Records           Med Note (LEE, NICOLE   Thu Dec 15, 2023  5:50 AM)    Glucagon (GVOKE HYPOPEN 2-PACK) 1 MG/0.2ML SOAJ [id number]  Inject 1 mg into the skin as needed for up to 2 doses (Severe low  blood sugar).  Patient not taking: Reported on 01/20/2024   Rojelio Nest, DO  Active   Glucose Blood (BLOOD GLUCOSE TEST STRIPS) STRP [id number] Yes 1 each by Does not apply route 3 (three) times daily. Use as directed to check blood sugar. May dispense any manufacturer covered by patient's insurance and fits patient's device. Rojelio Nest, DO  Active   hydrocortisone  2.5 % cream [id number] Yes Use 3 (three) times daily  rectally Singh, Prashant K, MD  Active Self, Pharmacy Records  HYDROmorphone  (DILAUDID ) 2 MG tablet [id number]  Take 0.5 tablets (1 mg total) by mouth every 6 (six) hours as needed for severe pain (pain score 7-10).  Patient not taking: Reported on 01/20/2024   Garrick Charleston, MD  Active Self, Pharmacy Records  insulin  aspart (NOVOLOG ) 100 UNIT/ML FlexPen [id number] Yes Inject 0-6 Units into the skin 3 (three) times daily with meals. Check Blood Glucose (BG) and inject per scale: BG <150= 0 unit; BG 150-200= 1 unit; BG 201-250= 2 unit; BG 251-300= 3 unit; BG 301-350= 4 unit; BG 351-400= 5 unit; BG >400= 6 unit and Call Primary Care. Rojelio Nest, DO  Active   insulin  glargine (LANTUS ) 100 UNIT/ML Solostar Pen [id number] Yes Inject 9 Units into the skin daily. May substitute as needed per insurance. Rojelio Nest, DO  Active   Insulin  Pen Needle (PEN NEEDLES) 31G X 5 MM MISC [id number] Yes 1 each by Does not apply route 3 (three) times daily. May dispense any manufacturer covered by patient's insurance. Rojelio Nest, DO  Active   lactulose  (CHRONULAC ) 10 GM/15ML solution [id number] Yes Take 30 mLs (20 g total) by mouth 4 (four) times daily.  Patient taking differently: Take 20 g by mouth 2 (two) times daily. For 10 days per discharge instructions from [facility] on 01/19/2024   Arlice Reichert, MD  Active Self, Pharmacy Records  Lancet Device MISC [id number] Yes 1 each by Does not apply route 3 (three) times daily. May dispense any manufacturer covered by patient's insurance. Rojelio Nest, DO  Active   Lancets MISC [id number] Yes 1 each by Does not apply route 3 (three) times daily. Use as directed to check blood sugar. May dispense any manufacturer covered by patient's insurance and fits patient's device. Rojelio Nest, DO  Active   lidocaine  (LIDODERM ) 5 % [id number] Yes Place 1 patch onto the skin daily. Remove & Discard patch within 12 hours or as directed by MD [provider]  Active    LORazepam  (ATIVAN ) 0.5 MG tablet [id number]  Take 0.5 mg by mouth at bedtime.  Patient not taking: Reported on 01/20/2024   [provider]  Active Self, Pharmacy Records  midodrine  (PROAMATINE ) 10 MG tablet [id number] Yes Take 1 tablet (10 mg total) by mouth 2 (two) times daily with a meal.  Patient taking differently: Take 15 mg by mouth 3 (three) times daily. Takes 5 mg tablets.  3 tablets ( 15mg ) three times per day per discharge instructions on 01/19/2024  [facility]   Dahal, Reichert, MD  Active Self, Pharmacy Records  montelukast  (SINGULAIR ) 10 MG tablet [id number] Yes Take 1 tablet (10 mg total) by mouth at bedtime. [name], MD  Active Self, Pharmacy Records           Med Note LEOBARDO, NICOLE   Thu Dec 15, 2023  5:51 AM)    omeprazole  (PRILOSEC) 40 MG capsule [id number] Yes TAKE 1 CAPSULE (40 MG TOTAL) BY MOUTH IN THE MORNING Danis, Victory LITTIE MOULD, MD  Active Self, Pharmacy Records           Med Note (LEE, NICOLE   Thu Dec 15, 2023  5:51 AM)    ondansetron  (ZOFRAN -ODT) 4 MG disintegrating tablet [id number] Yes Take 1 tablet (4 mg total) by mouth every 8 (eight) hours as needed for nausea or vomiting. Garrick Charleston, MD  Active Self, Pharmacy Records  prochlorperazine  (COMPAZINE ) 10 MG tablet [id number]  Take 1 tablet (10 mg total) by mouth every 8 (eight) hours as needed for nausea or vomiting.  Patient not taking: Reported on 01/20/2024   Legrand Victory LITTIE DOUGLAS, MD  Active Self, Pharmacy Records  rOPINIRole  (REQUIP ) 1 MG tablet [id number] Yes Take 1 tablet (1 mg total) by mouth at bedtime. [name], MD  Active Self, Pharmacy Records  spironolactone  (ALDACTONE ) 100 MG tablet [id number]  Take 3 tablets (300 mg total) by mouth daily.  Patient not taking: Reported on 01/20/2024   Sira, Zackery, MD  Active Self, Pharmacy Records           Med Note (LEE, NICOLE   Thu Dec 15, 2023  5:51 AM)    Vitamin D , Ergocalciferol , (DRISDOL ) 1.25 MG (50000 UNIT) CAPS capsule [id number] Yes Take  50,000 Units by mouth every 7 (seven) days. No specific day [provider]  Active Self, Pharmacy Records  XIFAXAN  550 MG TABS tablet [id number] Yes Take 550 mg by mouth in the morning. [provider]  Active Self, Pharmacy Records           Med Note (WHITE, TONIA S   Tue Jan 10, 2024  2:46 AM)    zinc sulfate, 50mg  elemental zinc, 220 (50 Zn) MG capsule [id number] Yes Take 220 mg by mouth 2 (two) times daily. [provider]  Active             Home Care and Equipment/Supplies: Were Home Health Services Ordered?: No Any new equipment or medical supplies ordered?: No  Functional Questionnaire: Do you need assistance with bathing/showering or dressing?: No Do you need assistance with meal preparation?: No Do you need assistance with eating?: No Do you have difficulty maintaining continence: No Do you need assistance with getting out of bed/getting out of a chair/moving?: No Do you have difficulty managing or taking your medications?: No  Follow up appointments reviewed: PCP Follow-up appointment confirmed?: No Specialist Hospital Follow-up appointment confirmed?: Yes Date of Specialist follow-up appointment?: 02/10/24 Follow-Up Specialty Provider:: Transplant team at [facility] Do you need transportation to your follow-up appointment?: No Do you understand care options if your condition(s) worsen?: Yes-patient verbalized understanding  SDOH Interventions Today    Flowsheet Row Most Recent Value  SDOH Interventions   Food Insecurity Interventions Intervention Not Indicated  Housing Interventions Intervention Not Indicated  Transportation Interventions Intervention Not Indicated  Utilities Interventions Intervention Not Indicated   Patient reports that she is feeling much better. Reports that she finally feels like herself. Reports that she was accepted on the liver transplant list as of 01/18/2024.  Denies any current needs. Reports that she is  missing her Zinc and will call the pharmacy. Otherwise has all her medications and is taking them as prescribed.    Denies completion of TOC call and assessments.  Denies need for further calls.Encouraged patient to call PCP and schedule a hospital follow up . She reports that she is having close follow up at [facility] with the liver specialist.  Provided my contact information for patient to call me if needed. Alan  Rose, RN, BSN, [employer]- Transition of Care Team.  [facility] [phone]

## 2024-01-23 ENCOUNTER — Other Ambulatory Visit (HOSPITAL_COMMUNITY): Payer: Self-pay | Admitting: Nurse Practitioner

## 2024-01-23 DIAGNOSIS — R188 Other ascites: Secondary | ICD-10-CM

## 2024-01-24 ENCOUNTER — Telehealth: Payer: Self-pay | Admitting: Family Medicine

## 2024-01-24 NOTE — Telephone Encounter (Signed)
 Pt needs to make a Hospital f/u appt -- discharge date 01/19/24

## 2024-01-26 NOTE — Progress Notes (Signed)
 Location Information: Patient State (at time of visit): [place]  Patient Location (at time of visit):Home/Other Non-Medical  Provider Location: [place]  Is provider licensed to provide clinical care in the current location/state of the patient? Yes   Consent:  Patient's identity was confirmed. Presenting condition or illness was discussed with the patient/personal representative. Current proposed treatment for presenting condition or illness was explained to patient/personal representative along with the likely benefits and any significant risks or complications associated with the provision of treatment by audio/video means. The patient/personal representative verbally authorized treatment to be provided by audio/video, which may include a limited review of patient's current health status, medication, or other treatment recommendations, patient education, and an opportunity to ask questions about condition and treatment. Verbal Consent Granted by Patient/Personal Representative:Yes   Visit Information: Modality: 2-Way Real-Time Audio/Video Video Start Time: 13:01 Video Stop Time: 14:06 Video Total Time: 65 min  HPI   43 year old female with decompensated MASLD cirrhosis, complicated by ascites, hepatic encephalopathy, history of obesity, diabetes mellitus type 2, nonbleeding esophageal varices, hypotension on midodrine .  Patient is participating on this video visit as part of liver transplantation preparedness planning, she is accompanied by her husband, Juliene.  Patient is originally from the [place]  area and still lives 5 minutes from where she grew up.   Patient and her husband have been married for almost 24 years they have two kids 33 year old boy and a 47 year old girl.  She previously worked for the school system, she worked as a Conservation officer, nature in [employer].  Currently not working.  Her biggest difficulty in dealing with her illness is dealing with  ascites, having to get paracentesis every week.  She does not feel like she can travel safely because of hepatic encephalopathy, and has to rely on others to take her places.  As a result of that she feels that she is stuck at home most of the time.  She utilizes various coping strategies primarily relying on her husband and her two best friends.  She states that at 1st she was very angry about her condition and now she feels better about it.  She found out about her liver disease around the same time when her mother died last 03-10-2024.  Her father who has heart failure and diabetes lives with them.  He requires caregiving although he is independent with most ADLs.  He requires someone to give him insulin .  Patient's husband provides primary caregiving support to both her and her father.  Her primary support system consists of her husband, her kids, her friends, and her church family.  Her husband also has a good support in his side of the family.  Her biggest fear is what if she does not benefit from liver transplant and dies from her illness.  Review of Systems [place] Symptom Assessment Scores Pain Score  : 5    Tiredness Score: 7 Dyspnea Score: No shortness of breath  Nausea Score: 7 Sleep Score: 7  Depression Score: Not depressed Financial Score: 6  Anxiety Score: 6 Spiritual Score : 1  Drowsiness Score: 8 Other Problem Score: Best possible response  Appetite Score: 6    PHQ-4 Feeling nervous, anxious, or on edge: Several days Not being able to stop or control worrying: Several days Little interest or pleasure in doing things: Not at all Feeling down, depressed, or hopeless: Not at all PHQ-4 Total Score: 2   Home Medications           * blood-glucose meter (  Accu-Chek Guide Me Glucose Mtr) misc   * Dexcom G7 Sensor   * ergocalciferol  (VITAMIN D2) 1,250 mcg (50,000 unit) capsule   * ferrous sulfate 325 mg (65 mg iron) tablet    Take 1 tablet (325 mg total) by mouth daily  before breakfast.   * FLUoxetine  (PROzac ) 40 mg capsule   * glucose blood (Accu-Chek Guide test strips) test strip   * insulin  glargine (LANTUS  SoloStar) 100 unit/mL (3 mL) pen   * lactulose  (CHRONULAC ) 10 gram/15 mL solution    Take 30 mL (20 g total) by mouth 2 (two) times a day for 10 days.   * lidocaine  (SALONPAS) 4 % patch    Apply 1 patch topically at bedtime for 10 days.   * midodrine  (PROAMATINE ) 5 mg tablet    Take 3 tablets (15 mg total) by mouth in the morning and 3 tablets (15 mg total) at noon and 3 tablets (15 mg total) in the evening. Take before meals.   * montelukast  (SINGULAIR ) 10 mg tablet   * NovoLOG  Flexpen U-100 Insulin  100 unit/mL (3 mL) pen   * omeprazole  (PriLOSEC) 40 mg DR capsule   * ondansetron  (ZOFRAN -ODT) 4 mg disintegrating tablet   * rifAXIMin  (XIFAXAN ) 550 mg tablet    Take 1 tablet (550 mg total) by mouth 2 (two) times a day.   * rOPINIRole  (REQUIP ) 1 mg tablet   * zinc sulfate (ZINCATE) 220 mg capsule    Take 1 capsule (220 mg total) by mouth 2 (two) times a day.       Physical Exam -deferred   Assessment  43 year old female with decompensated MASLD cirrhosis, complicated by ascites, hepatic encephalopathy, history of obesity, diabetes mellitus type 2, nonbleeding esophageal varices, hypotension on midodrine .  Patient was referred to palliative medicine clinic for support, help with relevant medical decision making in the context of the advanced illness, and symptom management.  Discussed the role of palliative medicine as part of a comprehensive approach to care for patients with a serious illness.  Goals: Undergo liver transplantation  Fears: Unsuccessful medical treatments, dying  Symptoms/Dx:   -Decompensated cirrhosis: Managed by hepatology.  Currently undergoing evaluation for liver transplantation.  - Coping: Has a good support system.  It was difficult to assess patient's coping strategies and mechanisms during today's visit primarily  due to the type of visit and patient's underlying illness.  -Altered sleep patterns: Discussed how liver disease impacts normal circadian rhythm and what specific interventions may be implemented to help with that.  Psychosocial support: Provided psychosocial support to patient and her husband during this visit utilizing active listening and motivational interviewing techniques.  Karnofsky score: 70%  Hospice eligibility/appropriateness: Not hospice eligible at this time  ACP (Living Will, HC POA, MOST form): Discussed the importance of completing advanced directives.  A copy of the advance directives will be mailed to their house.  Primary caregivers: Family/friends  Follow-up: 1-2 months  Attestation and time: Total time spent on this visit including review of pertinent medical records prior to the visit, video visit time, collaboration with other team members and documentation time: 82 min.  *Some images could not be shown.

## 2024-01-27 ENCOUNTER — Other Ambulatory Visit: Payer: Self-pay | Admitting: Gastroenterology

## 2024-01-27 ENCOUNTER — Ambulatory Visit (HOSPITAL_COMMUNITY)
Admission: RE | Admit: 2024-01-27 | Discharge: 2024-01-27 | Disposition: A | Discharge status: Home / Self Care | Source: Ambulatory Visit | Attending: Nurse Practitioner | Admitting: Nurse Practitioner

## 2024-01-27 DIAGNOSIS — R188 Other ascites: Secondary | ICD-10-CM | POA: Diagnosis present

## 2024-01-27 HISTORY — PX: IR PARACENTESIS: IMG2679

## 2024-01-27 MED ORDER — LIDOCAINE-EPINEPHRINE 1 %-1:100000 IJ SOLN
INTRAMUSCULAR | Status: AC
  Filled 2024-01-27: qty 1

## 2024-01-27 MED ORDER — ALBUMIN HUMAN 25 % IV SOLN
INTRAVENOUS | Status: AC
  Filled 2024-01-27: qty 200

## 2024-01-27 MED ORDER — LIDOCAINE-EPINEPHRINE 1 %-1:100000 IJ SOLN
20.0000 mL | Freq: Once | INTRAMUSCULAR | Status: AC
  Administered 2024-01-27: 10 mL via INTRADERMAL

## 2024-01-27 MED ORDER — ALBUMIN HUMAN 25 % IV SOLN
50.0000 g | Freq: Once | INTRAVENOUS | Status: AC
  Administered 2024-01-27: 50 g via INTRAVENOUS

## 2024-01-27 NOTE — Procedures (Signed)
 PROCEDURE SUMMARY:  Successful image-guided paracentesis from the left abdomen.  Yielded 8 liters of clear, straw-colored peritoneal fluid.  No immediate complications.  EBL: zero Patient tolerated well.   Please see imaging section of Epic for full dictation.  [place] LABOR Howell Groesbeck PA-C 01/27/2024 9:59 AM

## 2024-01-27 NOTE — Telephone Encounter (Signed)
 2nd call- attempted to make Hospital f/u appt for pt.  Discharge date 01/19/24.

## 2024-01-31 ENCOUNTER — Emergency Department (HOSPITAL_COMMUNITY)

## 2024-01-31 ENCOUNTER — Encounter (HOSPITAL_COMMUNITY): Payer: Self-pay

## 2024-01-31 ENCOUNTER — Inpatient Hospital Stay (HOSPITAL_COMMUNITY)
Admission: EM | Admit: 2024-01-31 | Discharge: 2024-02-03 | DRG: 433 | Disposition: A | Discharge status: Home / Self Care | Attending: Family Medicine | Admitting: Family Medicine

## 2024-01-31 ENCOUNTER — Other Ambulatory Visit: Payer: Self-pay

## 2024-01-31 DIAGNOSIS — K8010 Calculus of gallbladder with chronic cholecystitis without obstruction: Secondary | ICD-10-CM | POA: Diagnosis present

## 2024-01-31 DIAGNOSIS — E871 Hypo-osmolality and hyponatremia: Secondary | ICD-10-CM | POA: Diagnosis present

## 2024-01-31 DIAGNOSIS — R188 Other ascites: Secondary | ICD-10-CM | POA: Diagnosis present

## 2024-01-31 DIAGNOSIS — R509 Fever, unspecified: Secondary | ICD-10-CM

## 2024-01-31 DIAGNOSIS — Z7682 Awaiting organ transplant status: Secondary | ICD-10-CM

## 2024-01-31 DIAGNOSIS — Z825 Family history of asthma and other chronic lower respiratory diseases: Secondary | ICD-10-CM

## 2024-01-31 DIAGNOSIS — E876 Hypokalemia: Secondary | ICD-10-CM | POA: Diagnosis present

## 2024-01-31 DIAGNOSIS — Z8261 Family history of arthritis: Secondary | ICD-10-CM

## 2024-01-31 DIAGNOSIS — D61818 Other pancytopenia: Secondary | ICD-10-CM | POA: Diagnosis present

## 2024-01-31 DIAGNOSIS — E8770 Fluid overload, unspecified: Secondary | ICD-10-CM | POA: Diagnosis present

## 2024-01-31 DIAGNOSIS — Z794 Long term (current) use of insulin: Secondary | ICD-10-CM

## 2024-01-31 DIAGNOSIS — K7290 Hepatic failure, unspecified without coma: Secondary | ICD-10-CM | POA: Diagnosis present

## 2024-01-31 DIAGNOSIS — Z79899 Other long term (current) drug therapy: Secondary | ICD-10-CM

## 2024-01-31 DIAGNOSIS — K7581 Nonalcoholic steatohepatitis (NASH): Secondary | ICD-10-CM | POA: Diagnosis present

## 2024-01-31 DIAGNOSIS — E1165 Type 2 diabetes mellitus with hyperglycemia: Secondary | ICD-10-CM | POA: Diagnosis present

## 2024-01-31 DIAGNOSIS — K219 Gastro-esophageal reflux disease without esophagitis: Secondary | ICD-10-CM | POA: Diagnosis present

## 2024-01-31 DIAGNOSIS — R1084 Generalized abdominal pain: Principal | ICD-10-CM

## 2024-01-31 DIAGNOSIS — F909 Attention-deficit hyperactivity disorder, unspecified type: Secondary | ICD-10-CM | POA: Diagnosis present

## 2024-01-31 DIAGNOSIS — G2581 Restless legs syndrome: Secondary | ICD-10-CM | POA: Diagnosis present

## 2024-01-31 DIAGNOSIS — Z880 Allergy status to penicillin: Secondary | ICD-10-CM

## 2024-01-31 DIAGNOSIS — F32A Depression, unspecified: Secondary | ICD-10-CM | POA: Diagnosis present

## 2024-01-31 DIAGNOSIS — Z881 Allergy status to other antibiotic agents status: Secondary | ICD-10-CM

## 2024-01-31 DIAGNOSIS — Z885 Allergy status to narcotic agent status: Secondary | ICD-10-CM

## 2024-01-31 DIAGNOSIS — K766 Portal hypertension: Secondary | ICD-10-CM | POA: Diagnosis present

## 2024-01-31 DIAGNOSIS — E119 Type 2 diabetes mellitus without complications: Secondary | ICD-10-CM

## 2024-01-31 DIAGNOSIS — K7682 Hepatic encephalopathy: Secondary | ICD-10-CM | POA: Diagnosis present

## 2024-01-31 DIAGNOSIS — Z8616 Personal history of COVID-19: Secondary | ICD-10-CM

## 2024-01-31 DIAGNOSIS — K7460 Unspecified cirrhosis of liver: Principal | ICD-10-CM | POA: Diagnosis present

## 2024-01-31 DIAGNOSIS — F419 Anxiety disorder, unspecified: Secondary | ICD-10-CM | POA: Diagnosis present

## 2024-01-31 DIAGNOSIS — D684 Acquired coagulation factor deficiency: Secondary | ICD-10-CM | POA: Diagnosis present

## 2024-01-31 DIAGNOSIS — E1140 Type 2 diabetes mellitus with diabetic neuropathy, unspecified: Secondary | ICD-10-CM | POA: Diagnosis present

## 2024-01-31 DIAGNOSIS — I9589 Other hypotension: Secondary | ICD-10-CM | POA: Diagnosis present

## 2024-01-31 DIAGNOSIS — Z91040 Latex allergy status: Secondary | ICD-10-CM

## 2024-01-31 DIAGNOSIS — Z833 Family history of diabetes mellitus: Secondary | ICD-10-CM

## 2024-01-31 DIAGNOSIS — R162 Hepatomegaly with splenomegaly, not elsewhere classified: Secondary | ICD-10-CM | POA: Diagnosis present

## 2024-01-31 LAB — COMPREHENSIVE METABOLIC PANEL WITH GFR
ALT: 25 U/L (ref 0–44)
AST: 38 U/L (ref 15–41)
Albumin: 3 g/dL — ABNORMAL LOW (ref 3.5–5.0)
Alkaline Phosphatase: 129 U/L — ABNORMAL HIGH (ref 38–126)
Anion gap: 9 (ref 5–15)
BUN: 7 mg/dL (ref 6–20)
CO2: 20 mmol/L — ABNORMAL LOW (ref 22–32)
Calcium: 8 mg/dL — ABNORMAL LOW (ref 8.9–10.3)
Chloride: 99 mmol/L (ref 98–111)
Creatinine, Ser: 0.76 mg/dL (ref 0.44–1.00)
GFR, Estimated: >60 mL/min (ref >60)
Glucose, Bld: 335 mg/dL — ABNORMAL HIGH (ref 70–99)
Potassium: 3.3 mmol/L — ABNORMAL LOW (ref 3.5–5.1)
Sodium: 128 mmol/L — ABNORMAL LOW (ref 135–145)
Total Bilirubin: 2.9 mg/dL — ABNORMAL HIGH (ref 0.0–1.2)
Total Protein: 6.7 g/dL (ref 6.5–8.1)

## 2024-01-31 LAB — PROTIME-INR
INR: 1.8 — ABNORMAL HIGH (ref 0.8–1.2)
Prothrombin Time: 21.9 s — ABNORMAL HIGH (ref 11.4–15.2)

## 2024-01-31 LAB — CBC WITH DIFFERENTIAL/PLATELET
Abs Immature Granulocytes: 0 K/uL (ref 0.00–0.07)
Basophils Absolute: 0 K/uL (ref 0.0–0.1)
Basophils Relative: 1 %
Eosinophils Absolute: 0.1 K/uL (ref 0.0–0.5)
Eosinophils Relative: 4 %
HCT: 27.5 % — ABNORMAL LOW (ref 36.0–46.0)
Hemoglobin: 8.8 g/dL — ABNORMAL LOW (ref 12.0–15.0)
Immature Granulocytes: 0 %
Lymphocytes Relative: 20 %
Lymphs Abs: 0.4 K/uL — ABNORMAL LOW (ref 0.7–4.0)
MCH: 28.9 pg (ref 26.0–34.0)
MCHC: 32 g/dL (ref 30.0–36.0)
MCV: 90.2 fL (ref 80.0–100.0)
Monocytes Absolute: 0.2 K/uL (ref 0.1–1.0)
Monocytes Relative: 11 %
Neutro Abs: 1.3 K/uL — ABNORMAL LOW (ref 1.7–7.7)
Neutrophils Relative %: 64 %
Platelets: 70 K/uL — ABNORMAL LOW (ref 150–400)
RBC: 3.05 MIL/uL — ABNORMAL LOW (ref 3.87–5.11)
RDW: 16.3 % — ABNORMAL HIGH (ref 11.5–15.5)
WBC: 2.1 K/uL — ABNORMAL LOW (ref 4.0–10.5)
nRBC: 0 % (ref 0.0–0.2)

## 2024-01-31 LAB — RESP PANEL BY RT-PCR (RSV, FLU A&B, COVID)  RVPGX2
Influenza A by PCR: NEGATIVE
Influenza B by PCR: NEGATIVE
Resp Syncytial Virus by PCR: NEGATIVE
SARS Coronavirus 2 by RT PCR: NEGATIVE

## 2024-01-31 LAB — LIPASE, BLOOD: Lipase: 73 U/L — ABNORMAL HIGH (ref 11–51)

## 2024-01-31 LAB — I-STAT CG4 LACTIC ACID, ED: Lactic Acid, Venous: 1.8 mmol/L (ref 0.5–1.9)

## 2024-01-31 LAB — AMMONIA: Ammonia: 76 umol/L — ABNORMAL HIGH (ref 9–35)

## 2024-01-31 MED ORDER — METOCLOPRAMIDE HCL 5 MG/ML IJ SOLN
10.0000 mg | Freq: Once | INTRAMUSCULAR | Status: AC
  Administered 2024-01-31: 10 mg via INTRAVENOUS
  Filled 2024-01-31: qty 2

## 2024-01-31 MED ORDER — DIPHENHYDRAMINE HCL 50 MG/ML IJ SOLN
25.0000 mg | Freq: Once | INTRAMUSCULAR | Status: AC
  Administered 2024-01-31: 25 mg via INTRAVENOUS
  Filled 2024-01-31: qty 1

## 2024-01-31 MED ORDER — IOHEXOL 350 MG/ML SOLN
75.0000 mL | Freq: Once | INTRAVENOUS | Status: AC | PRN
  Administered 2024-02-01: 75 mL via INTRAVENOUS

## 2024-01-31 MED ORDER — LIDOCAINE-EPINEPHRINE (PF) 2 %-1:200000 IJ SOLN
10.0000 mL | Freq: Once | INTRAMUSCULAR | Status: AC
  Administered 2024-01-31: 10 mL via INTRADERMAL
  Filled 2024-01-31: qty 20

## 2024-01-31 MED ORDER — SODIUM CHLORIDE 0.9 % IV SOLN
2.0000 g | Freq: Once | INTRAVENOUS | Status: AC
  Administered 2024-01-31: 2 g via INTRAVENOUS
  Filled 2024-01-31: qty 20

## 2024-01-31 MED ORDER — HYDROMORPHONE HCL 1 MG/ML IJ SOLN
0.5000 mg | Freq: Once | INTRAMUSCULAR | Status: AC
  Administered 2024-01-31: 0.5 mg via INTRAVENOUS
  Filled 2024-01-31: qty 1

## 2024-01-31 MED ORDER — SODIUM CHLORIDE 0.9 % IV BOLUS
1000.0000 mL | Freq: Once | INTRAVENOUS | Status: AC
  Administered 2024-01-31: 1000 mL via INTRAVENOUS

## 2024-01-31 NOTE — ED Triage Notes (Signed)
 Pt in POV with husband. States hx of liver failure, gets bi-weekly paracentesis done - last Friday they drew off 8L of fluid. Pt reports temp of 101.1 at home, c/o sob and abd swelling.   Of note, pt states she got a recent call from [facility] she is approved for liver transplant.

## 2024-01-31 NOTE — ED Provider Notes (Signed)
 [place] [facility]GENC[facility] DEPARTMENT AT [facility] Provider Note   CSN: [id number] Arrival date & time: 01/31/24  2026     Patient presents with: No chief complaint on file.   Laurie Sutton is a 43 y.o. female.   43 yo F with a chief complaints of fever and abdominal pain.  Diffuse abdominal pain feels like it is more swollen than typical.  Most recent paracentesis was done on Friday.  Fever as high as 102 at home.  Has had a little bit of a cough otherwise denies congestion urinary symptoms rash.        Prior to Admission medications   Medication Sig Start Date End Date Taking? Authorizing Provider  acetaminophen  (TYLENOL ) 325 MG tablet Take 650 mg by mouth every 6 (six) hours as needed for mild pain (pain score 1-3).    [provider]  albuterol  (VENTOLIN  HFA) 108 (90 Base) MCG/ACT inhaler INHALE 2 PUFFS INTO THE LUNGS UP TO EVERY 6 HOURS AS NEEDED FOR WHEEZING/SHORTNESS OF BREATH Patient not taking: Reported on 01/20/2024 08/20/22   [name], MD  bisacodyl  (DULCOLAX) 5 MG EC tablet Take 2 tablets (10 mg total) by mouth daily as needed for moderate constipation. Patient not taking: Reported on 01/20/2024 12/19/23   Arlice Reichert, MD  Blood Glucose Monitoring Suppl DEVI 1 each by Does not apply route 3 (three) times daily. May dispense any manufacturer covered by patient's insurance. 01/11/24   Rojelio Nest, DO  calcium  carbonate (TUMS - DOSED IN MG ELEMENTAL CALCIUM ) 500 MG chewable tablet Chew 2 tablets by mouth as needed for indigestion or heartburn. Patient not taking: Reported on 01/20/2024    [provider]  clotrimazole -betamethasone  (LOTRISONE ) cream APPLY 1 APPLICATION TOPICALLY TWICE A DAY AS NEEDED Patient not taking: Reported on 01/20/2024 05/26/23   [name], MD  Continuous Glucose Sensor (DEXCOM G7 SENSOR) MISC Use 1 sensor for continuous glucose monitoring every 10 days for 30 days 05/25/23   Braulio Hough, MD  ferrous sulfate  325 (65 FE) MG EC tablet Take 325 mg by mouth daily with breakfast.    [provider]  FLUoxetine  (PROZAC ) 40 MG capsule Take 1 capsule (40 mg total) by mouth 2 (two) times daily. 01/03/24   [name], MD  furosemide  (LASIX ) 80 MG tablet Take 1 tablet (80 mg total) by mouth daily. Patient not taking: Reported on 01/20/2024 10/11/23   Sheikh, Omair Latif, DO  Glucagon (GVOKE HYPOPEN 2-PACK) 1 MG/0.2ML SOAJ Inject 1 mg into the skin as needed for up to 2 doses (Severe low blood sugar). Patient not taking: Reported on 01/20/2024 01/11/24   Rojelio Nest, DO  Glucose Blood (BLOOD GLUCOSE TEST STRIPS) STRP 1 each by Does not apply route 3 (three) times daily. Use as directed to check blood sugar. May dispense any manufacturer covered by patient's insurance and fits patient's device. 01/11/24   Rojelio Nest, DO  hydrocortisone  2.5 % cream Use 3 (three) times daily rectally 11/22/23   Singh, Prashant K, MD  HYDROmorphone  (DILAUDID ) 2 MG tablet Take 0.5 tablets (1 mg total) by mouth every 6 (six) hours as needed for severe pain (pain score 7-10). Patient not taking: Reported on 01/20/2024 01/06/24   Garrick Charleston, MD  insulin  aspart (NOVOLOG ) 100 UNIT/ML FlexPen Inject 0-6 Units into the skin 3 (three) times daily with meals. Check Blood Glucose (BG) and inject per scale: BG <150= 0 unit; BG 150-200= 1 unit; BG 201-250= 2 unit; BG 251-300= 3  unit; BG 301-350= 4 unit; BG 351-400= 5 unit; BG >400= 6 unit and Call Primary Care. 01/11/24   Rojelio Nest, DO  insulin  glargine (LANTUS ) 100 UNIT/ML Solostar Pen Inject 9 Units into the skin daily. May substitute as needed per insurance. 01/11/24   Rojelio Nest, DO  Insulin  Pen Needle (PEN NEEDLES) 31G X 5 MM MISC 1 each by Does not apply route 3 (three) times daily. May dispense any manufacturer covered by patient's insurance. 01/11/24   Rojelio Nest, DO  lactulose  (CHRONULAC ) 10 GM/15ML solution Take 30 mLs (20 g total) by mouth 4 (four) times  daily. Patient taking differently: Take 20 g by mouth 2 (two) times daily. For 10 days per discharge instructions from [facility] on 01/19/2024 12/19/23 03/18/24  Arlice Reichert, MD  Lancet Device MISC 1 each by Does not apply route 3 (three) times daily. May dispense any manufacturer covered by patient's insurance. 01/11/24   Rojelio Nest, DO  Lancets MISC 1 each by Does not apply route 3 (three) times daily. Use as directed to check blood sugar. May dispense any manufacturer covered by patient's insurance and fits patient's device. 01/11/24   Rojelio Nest, DO  lidocaine  (LIDODERM ) 5 % Place 1 patch onto the skin daily. Remove & Discard patch within 12 hours or as directed by MD    [provider]  LORazepam  (ATIVAN ) 0.5 MG tablet Take 0.5 mg by mouth at bedtime. Patient not taking: Reported on 01/20/2024    [provider]  midodrine  (PROAMATINE ) 10 MG tablet Take 1 tablet (10 mg total) by mouth 2 (two) times daily with a meal. Patient taking differently: Take 15 mg by mouth 3 (three) times daily. Takes 5 mg tablets.  3 tablets ( 15mg ) three times per day per discharge instructions on 01/19/2024  [facility] 12/19/23 03/18/24  Arlice Reichert, MD  montelukast  (SINGULAIR ) 10 MG tablet Take 1 tablet (10 mg total) by mouth at bedtime. 08/25/22   [name], MD  omeprazole  (PRILOSEC) 40 MG capsule TAKE 1 CAPSULE (40 MG TOTAL) BY MOUTH IN THE MORNING 01/27/24   Legrand Victory LITTIE DOUGLAS, MD  ondansetron  (ZOFRAN -ODT) 4 MG disintegrating tablet Take 1 tablet (4 mg total) by mouth every 8 (eight) hours as needed for nausea or vomiting. 01/06/24   Garrick Charleston, MD  prochlorperazine  (COMPAZINE ) 10 MG tablet Take 1 tablet (10 mg total) by mouth every 8 (eight) hours as needed for nausea or vomiting. Patient not taking: Reported on 01/20/2024 12/29/23   Legrand Victory LITTIE DOUGLAS, MD  rOPINIRole  (REQUIP ) 1 MG tablet Take 1 tablet (1 mg total) by mouth at bedtime. 01/03/24   [name], MD   spironolactone  (ALDACTONE ) 100 MG tablet Take 3 tablets (300 mg total) by mouth daily. Patient not taking: Reported on 01/20/2024 11/01/23 04/11/24  Sira, Zackery, MD  Vitamin D , Ergocalciferol , (DRISDOL ) 1.25 MG (50000 UNIT) CAPS capsule Take 50,000 Units by mouth every 7 (seven) days. No specific day 11/19/23   [provider]  XIFAXAN  550 MG TABS tablet Take 550 mg by mouth in the morning. 10/27/23   [provider]  zinc sulfate, 50mg  elemental zinc, 220 (50 Zn) MG capsule Take 220 mg by mouth 2 (two) times daily.    [provider]    Allergies: Vancomycin , Amoxicillin, Azithromycin , Dulaglutide , Empagliflozin -metformin  hcl er, Januvia  [sitagliptin ], Latex, Metformin , Morphine , Penicillins, and Vitamin k  and related    Review of Systems  Updated Vital Signs BP (!) 117/48   Pulse (!) 102  Temp 98 F (36.7 C)   Resp 20   LMP 11/13/2020 (Exact Date)   SpO2 99%   Physical Exam Vitals and nursing note reviewed.  Constitutional:      General: She is not in acute distress.    Appearance: She is well-developed. She is not diaphoretic.     Comments: Mild confusion slurred speech  HENT:     Head: Normocephalic and atraumatic.  Eyes:     Pupils: Pupils are equal, round, and reactive to light.  Cardiovascular:     Rate and Rhythm: Normal rate and regular rhythm.     Heart sounds: No murmur heard.    No friction rub. No gallop.  Pulmonary:     Effort: Pulmonary effort is normal.     Breath sounds: No wheezing or rales.  Abdominal:     General: There is distension.     Palpations: Abdomen is soft.     Tenderness: There is no abdominal tenderness.     Comments: Diffuse abdominal discomfort on exam.  Positive fluid wave.  Musculoskeletal:        General: No tenderness.     Cervical back: Normal range of motion and neck supple.  Skin:    General: Skin is warm and dry.  Neurological:     Mental Status: She is alert and oriented to person, place, and time.   Psychiatric:        Behavior: Behavior normal.     (all labs ordered are listed, but only abnormal results are displayed) Labs Reviewed  CBC WITH DIFFERENTIAL/PLATELET - Abnormal; Notable for the following components:      Result Value   WBC 2.1 (*)    RBC 3.05 (*)    Hemoglobin 8.8 (*)    HCT 27.5 (*)    RDW 16.3 (*)    Platelets 70 (*)    Neutro Abs 1.3 (*)    Lymphs Abs 0.4 (*)    All other components within normal limits  COMPREHENSIVE METABOLIC PANEL WITH GFR - Abnormal; Notable for the following components:   Sodium 128 (*)    Potassium 3.3 (*)    CO2 20 (*)    Glucose, Bld 335 (*)    Calcium  8.0 (*)    Albumin  3.0 (*)    Alkaline Phosphatase 129 (*)    Total Bilirubin 2.9 (*)    All other components within normal limits  LIPASE, BLOOD - Abnormal; Notable for the following components:   Lipase 73 (*)    All other components within normal limits  PROTIME-INR - Abnormal; Notable for the following components:   Prothrombin Time 21.9 (*)    INR 1.8 (*)    All other components within normal limits  AMMONIA - Abnormal; Notable for the following components:   Ammonia 76 (*)    All other components within normal limits  RESP PANEL BY RT-PCR (RSV, FLU A&B, COVID)  RVPGX2  BODY FLUID CULTURE W GRAM STAIN  URINALYSIS, ROUTINE W REFLEX MICROSCOPIC  BODY FLUID CELL COUNT WITH DIFFERENTIAL  I-STAT CG4 LACTIC ACID, ED  I-STAT CHEM 8, ED    EKG: None  Radiology: DG Chest Port 1 View Result Date: 01/31/2024 CLINICAL DATA:  Cough and fever EXAM: PORTABLE CHEST 1 VIEW COMPARISON:  01/13/2024 FINDINGS: The heart size and mediastinal contours are within normal limits. Both lungs are clear. The visualized skeletal structures are unremarkable. IMPRESSION: No active disease. Electronically Signed   By: Luke Bun M.D.   On: 01/31/2024 21:41  Paracentesis  Date/Time: 01/31/2024 11:43 PM  Performed by: Emil Share, DO Authorized by: Emil Share, DO   Consent:     Consent obtained:  Verbal   Consent given by:  Patient   Risks, benefits, and alternatives were discussed: yes     Risks discussed:  Bleeding, bowel perforation, infection and pain   Alternatives discussed:  No treatment, delayed treatment and alternative treatment Universal protocol:    Procedure explained and questions answered to patient or proxy's satisfaction: yes     Immediately prior to procedure, a time out was called: yes     Patient identity confirmed:  Verbally with patient Pre-procedure details:    Procedure purpose:  Diagnostic   Preparation: Patient was prepped and draped in usual sterile fashion   Anesthesia:    Anesthesia method:  Local infiltration   Local anesthetic:  Lidocaine  2% WITH epi Procedure details:    Needle gauge: 21.   Ultrasound guidance: yes     Puncture site:  R lower quadrant   Fluid removed amount:  20cc   Fluid appearance:  Amber and clear   Dressing:  4x4 sterile gauze Post-procedure details:    Procedure completion:  Tolerated well, no immediate complications   Procedure note: Ultrasound Guided Peripheral IV Ultrasound guided peripheral 1.88 inch angiocath IV placement performed by me. Indications: Nursing unable to place IV. Details: The antecubital fossa and upper arm were evaluated with a multifrequency linear probe. Patent brachial veins were noted. 1 attempt was made to cannulate a vein under realtime US  guidance with successful cannulation of the vein and catheter placement. There is return of non-pulsatile dark red blood. The patient tolerated the procedure well without complications. Images archived electronically.  CPT codes: 23062 and [phone]  Medications Ordered in the ED  iohexol  (OMNIPAQUE ) 350 MG/ML injection 75 mL (has no administration in time range)  sodium chloride  0.9 % bolus 1,000 mL (0 mLs Intravenous Stopped 01/31/24 2337)  metoCLOPramide (REGLAN) injection 10 mg (10 mg Intravenous Given 01/31/24 2157)  diphenhydrAMINE   (BENADRYL ) injection 25 mg (25 mg Intravenous Given 01/31/24 2156)  HYDROmorphone  (DILAUDID ) injection 0.5 mg (0.5 mg Intravenous Given 01/31/24 2159)  cefTRIAXone  (ROCEPHIN ) 2 g in sodium chloride  0.9 % 100 mL IVPB (0 g Intravenous Stopped 01/31/24 2245)  lidocaine -EPINEPHrine  (XYLOCAINE  W/EPI) 2 %-1:200000 (PF) injection 10 mL (10 mLs Intradermal Given by Other 01/31/24 2344)                                    Medical Decision Making Amount and/or Complexity of Data Reviewed Labs: ordered.  Risk Prescription drug management.   43 yo F with a known history of cirrhosis here with abdominal pain and fever.  Diffuse abdominal discomfort on exam.  Will obtain CT imaging.  Send off peritoneal fluid studies.  Leukopenia.  Anemia seems to be at baseline.  LFTs at baseline.  Lipase mildly elevated at 73.  INR seems to be at baseline.  Chest x-ray independently interpreted by me without focal infiltrate or pneumothorax.  COVID flu and RSV are negative.  Awaiting UA CT imaging.  Patient care was signed out to Dr. Griselda, please see their note for further details of care in the ED  The patients results and plan were reviewed and discussed.   Any x-rays performed were independently reviewed by myself.   Differential diagnosis were considered with the presenting HPI.  Medications  iohexol  (OMNIPAQUE ) 350 MG/ML  injection 75 mL (has no administration in time range)  sodium chloride  0.9 % bolus 1,000 mL (0 mLs Intravenous Stopped 01/31/24 2337)  metoCLOPramide (REGLAN) injection 10 mg (10 mg Intravenous Given 01/31/24 2157)  diphenhydrAMINE  (BENADRYL ) injection 25 mg (25 mg Intravenous Given 01/31/24 2156)  HYDROmorphone  (DILAUDID ) injection 0.5 mg (0.5 mg Intravenous Given 01/31/24 2159)  cefTRIAXone  (ROCEPHIN ) 2 g in sodium chloride  0.9 % 100 mL IVPB (0 g Intravenous Stopped 01/31/24 2245)  lidocaine -EPINEPHrine  (XYLOCAINE  W/EPI) 2 %-1:200000 (PF) injection 10 mL (10 mLs Intradermal Given  by Other 01/31/24 2344)    Vitals:   01/31/24 2035 01/31/24 2035 01/31/24 2245  BP: (!) 114/44 (!) 114/44 (!) 117/48  Pulse: 96 95 (!) 102  Resp: 16 16 20   Temp: 98 F (36.7 C) 98 F (36.7 C)   SpO2: 99% 99% 99%    Final diagnoses:  Generalized abdominal pain  Fever in adult         Final diagnoses:  Generalized abdominal pain  Fever in adult    ED Discharge Orders     None          Emil Share, DO 01/31/24 2348

## 2024-01-31 NOTE — ED Notes (Signed)
 Patient to CT.

## 2024-02-01 ENCOUNTER — Encounter (HOSPITAL_COMMUNITY): Payer: Self-pay | Admitting: Internal Medicine

## 2024-02-01 ENCOUNTER — Observation Stay (HOSPITAL_COMMUNITY)

## 2024-02-01 DIAGNOSIS — R188 Other ascites: Secondary | ICD-10-CM | POA: Diagnosis present

## 2024-02-01 DIAGNOSIS — K7469 Other cirrhosis of liver: Secondary | ICD-10-CM

## 2024-02-01 DIAGNOSIS — K7682 Hepatic encephalopathy: Secondary | ICD-10-CM

## 2024-02-01 DIAGNOSIS — R1084 Generalized abdominal pain: Secondary | ICD-10-CM | POA: Diagnosis present

## 2024-02-01 DIAGNOSIS — K219 Gastro-esophageal reflux disease without esophagitis: Secondary | ICD-10-CM | POA: Diagnosis present

## 2024-02-01 DIAGNOSIS — E876 Hypokalemia: Secondary | ICD-10-CM | POA: Diagnosis present

## 2024-02-01 DIAGNOSIS — E1169 Type 2 diabetes mellitus with other specified complication: Secondary | ICD-10-CM

## 2024-02-01 DIAGNOSIS — Z794 Long term (current) use of insulin: Secondary | ICD-10-CM | POA: Diagnosis not present

## 2024-02-01 DIAGNOSIS — K8010 Calculus of gallbladder with chronic cholecystitis without obstruction: Secondary | ICD-10-CM | POA: Diagnosis present

## 2024-02-01 DIAGNOSIS — Z833 Family history of diabetes mellitus: Secondary | ICD-10-CM | POA: Diagnosis not present

## 2024-02-01 DIAGNOSIS — K7290 Hepatic failure, unspecified without coma: Secondary | ICD-10-CM | POA: Diagnosis not present

## 2024-02-01 DIAGNOSIS — D61818 Other pancytopenia: Secondary | ICD-10-CM | POA: Diagnosis present

## 2024-02-01 DIAGNOSIS — D638 Anemia in other chronic diseases classified elsewhere: Secondary | ICD-10-CM

## 2024-02-01 DIAGNOSIS — K7460 Unspecified cirrhosis of liver: Principal | ICD-10-CM

## 2024-02-01 DIAGNOSIS — Z885 Allergy status to narcotic agent status: Secondary | ICD-10-CM | POA: Diagnosis not present

## 2024-02-01 DIAGNOSIS — E1140 Type 2 diabetes mellitus with diabetic neuropathy, unspecified: Secondary | ICD-10-CM | POA: Diagnosis present

## 2024-02-01 DIAGNOSIS — E871 Hypo-osmolality and hyponatremia: Secondary | ICD-10-CM | POA: Diagnosis present

## 2024-02-01 DIAGNOSIS — K766 Portal hypertension: Secondary | ICD-10-CM | POA: Diagnosis present

## 2024-02-01 DIAGNOSIS — D684 Acquired coagulation factor deficiency: Secondary | ICD-10-CM | POA: Diagnosis present

## 2024-02-01 DIAGNOSIS — K7581 Nonalcoholic steatohepatitis (NASH): Secondary | ICD-10-CM

## 2024-02-01 DIAGNOSIS — Z7682 Awaiting organ transplant status: Secondary | ICD-10-CM | POA: Diagnosis not present

## 2024-02-01 DIAGNOSIS — E1165 Type 2 diabetes mellitus with hyperglycemia: Secondary | ICD-10-CM | POA: Diagnosis present

## 2024-02-01 DIAGNOSIS — Z91040 Latex allergy status: Secondary | ICD-10-CM | POA: Diagnosis not present

## 2024-02-01 DIAGNOSIS — Z881 Allergy status to other antibiotic agents status: Secondary | ICD-10-CM | POA: Diagnosis not present

## 2024-02-01 DIAGNOSIS — F32A Depression, unspecified: Secondary | ICD-10-CM | POA: Diagnosis present

## 2024-02-01 DIAGNOSIS — Z880 Allergy status to penicillin: Secondary | ICD-10-CM | POA: Diagnosis not present

## 2024-02-01 DIAGNOSIS — Z8616 Personal history of COVID-19: Secondary | ICD-10-CM | POA: Diagnosis not present

## 2024-02-01 DIAGNOSIS — R162 Hepatomegaly with splenomegaly, not elsewhere classified: Secondary | ICD-10-CM | POA: Diagnosis present

## 2024-02-01 DIAGNOSIS — G2581 Restless legs syndrome: Secondary | ICD-10-CM | POA: Diagnosis present

## 2024-02-01 DIAGNOSIS — Z79899 Other long term (current) drug therapy: Secondary | ICD-10-CM | POA: Diagnosis not present

## 2024-02-01 HISTORY — PX: IR PARACENTESIS: IMG2679

## 2024-02-01 LAB — URINALYSIS, ROUTINE W REFLEX MICROSCOPIC
Bilirubin Urine: NEGATIVE
Glucose, UA: 150 mg/dL — AB
Hgb urine dipstick: NEGATIVE
Ketones, ur: NEGATIVE mg/dL
Leukocytes,Ua: NEGATIVE
Nitrite: NEGATIVE
Protein, ur: NEGATIVE mg/dL
Specific Gravity, Urine: >1.046 — ABNORMAL HIGH (ref 1.005–1.030)
pH: 6 (ref 5.0–8.0)

## 2024-02-01 LAB — CBC WITH DIFFERENTIAL/PLATELET
Abs Immature Granulocytes: 0 K/uL (ref 0.00–0.07)
Basophils Absolute: 0 K/uL (ref 0.0–0.1)
Basophils Relative: 1 %
Eosinophils Absolute: 0.1 K/uL (ref 0.0–0.5)
Eosinophils Relative: 4 %
HCT: 22 % — ABNORMAL LOW (ref 36.0–46.0)
Hemoglobin: 6.9 g/dL — CL (ref 12.0–15.0)
Immature Granulocytes: 0 %
Lymphocytes Relative: 20 %
Lymphs Abs: 0.3 K/uL — ABNORMAL LOW (ref 0.7–4.0)
MCH: 28.8 pg (ref 26.0–34.0)
MCHC: 31.4 g/dL (ref 30.0–36.0)
MCV: 91.7 fL (ref 80.0–100.0)
Monocytes Absolute: 0.2 K/uL (ref 0.1–1.0)
Monocytes Relative: 15 %
Neutro Abs: 1 K/uL — ABNORMAL LOW (ref 1.7–7.7)
Neutrophils Relative %: 60 %
Platelets: 63 K/uL — ABNORMAL LOW (ref 150–400)
RBC: 2.4 MIL/uL — ABNORMAL LOW (ref 3.87–5.11)
RDW: 16.4 % — ABNORMAL HIGH (ref 11.5–15.5)
WBC: 1.7 K/uL — ABNORMAL LOW (ref 4.0–10.5)
nRBC: 1.2 % — ABNORMAL HIGH (ref 0.0–0.2)

## 2024-02-01 LAB — BASIC METABOLIC PANEL WITH GFR
Anion gap: 5 (ref 5–15)
BUN: 6 mg/dL (ref 6–20)
CO2: 21 mmol/L — ABNORMAL LOW (ref 22–32)
Calcium: 7.8 mg/dL — ABNORMAL LOW (ref 8.9–10.3)
Chloride: 103 mmol/L (ref 98–111)
Creatinine, Ser: 0.75 mg/dL (ref 0.44–1.00)
GFR, Estimated: >60 mL/min (ref >60)
Glucose, Bld: 333 mg/dL — ABNORMAL HIGH (ref 70–99)
Potassium: 3.4 mmol/L — ABNORMAL LOW (ref 3.5–5.1)
Sodium: 129 mmol/L — ABNORMAL LOW (ref 135–145)

## 2024-02-01 LAB — I-STAT CHEM 8, ED
BUN: 5 mg/dL — ABNORMAL LOW (ref 6–20)
Calcium, Ion: 1.13 mmol/L — ABNORMAL LOW (ref 1.15–1.40)
Chloride: 98 mmol/L (ref 98–111)
Creatinine, Ser: 0.6 mg/dL (ref 0.44–1.00)
Glucose, Bld: 326 mg/dL — ABNORMAL HIGH (ref 70–99)
HCT: 25 % — ABNORMAL LOW (ref 36.0–46.0)
Hemoglobin: 8.5 g/dL — ABNORMAL LOW (ref 12.0–15.0)
Potassium: 3.3 mmol/L — ABNORMAL LOW (ref 3.5–5.1)
Sodium: 134 mmol/L — ABNORMAL LOW (ref 135–145)
TCO2: 21 mmol/L — ABNORMAL LOW (ref 22–32)

## 2024-02-01 LAB — BODY FLUID CELL COUNT WITH DIFFERENTIAL
Eos, Fluid: 0 %
Eos, Fluid: 0 %
Lymphs, Fluid: 85 %
Lymphs, Fluid: 47 %
Monocyte-Macrophage-Serous Fluid: 10 % — ABNORMAL LOW (ref 50–90)
Monocyte-Macrophage-Serous Fluid: 49 % — ABNORMAL LOW (ref 50–90)
Neutrophil Count, Fluid: 5 % (ref 0–25)
Neutrophil Count, Fluid: 4 % (ref 0–25)
Total Nucleated Cell Count, Fluid: 114 uL (ref 0–1000)
Total Nucleated Cell Count, Fluid: 64 uL (ref 0–1000)

## 2024-02-01 LAB — HEPATIC FUNCTION PANEL
ALT: 23 U/L (ref 0–44)
AST: 34 U/L (ref 15–41)
Albumin: 2.5 g/dL — ABNORMAL LOW (ref 3.5–5.0)
Alkaline Phosphatase: 94 U/L (ref 38–126)
Bilirubin, Direct: 0.9 mg/dL — ABNORMAL HIGH (ref 0.0–0.2)
Indirect Bilirubin: 1.9 mg/dL — ABNORMAL HIGH (ref 0.3–0.9)
Total Bilirubin: 2.8 mg/dL — ABNORMAL HIGH (ref 0.0–1.2)
Total Protein: 5.7 g/dL — ABNORMAL LOW (ref 6.5–8.1)

## 2024-02-01 LAB — PREPARE RBC (CROSSMATCH)

## 2024-02-01 LAB — GRAM STAIN

## 2024-02-01 LAB — ALBUMIN, PLEURAL OR PERITONEAL FLUID: Albumin, Fluid: <1.5 g/dL

## 2024-02-01 LAB — PROTEIN, PLEURAL OR PERITONEAL FLUID: Total protein, fluid: <3 g/dL

## 2024-02-01 LAB — PROTIME-INR
INR: 1.8 — ABNORMAL HIGH (ref 0.8–1.2)
Prothrombin Time: 22.1 s — ABNORMAL HIGH (ref 11.4–15.2)

## 2024-02-01 MED ORDER — MIDODRINE HCL 5 MG PO TABS
15.0000 mg | ORAL_TABLET | Freq: Three times a day (TID) | ORAL | Status: DC
  Administered 2024-02-01 – 2024-02-03 (×8): 15 mg via ORAL
  Filled 2024-02-01 (×9): qty 3

## 2024-02-01 MED ORDER — LIDOCAINE-EPINEPHRINE 1 %-1:100000 IJ SOLN
20.0000 mL | Freq: Once | INTRAMUSCULAR | Status: AC
Start: 2024-02-01 — End: 2024-02-01
  Administered 2024-02-01: 10 mL

## 2024-02-01 MED ORDER — RIFAXIMIN 550 MG PO TABS
550.0000 mg | ORAL_TABLET | Freq: Two times a day (BID) | ORAL | Status: DC
  Administered 2024-02-01 – 2024-02-03 (×5): 550 mg via ORAL
  Filled 2024-02-01 (×5): qty 1

## 2024-02-01 MED ORDER — SODIUM CHLORIDE 0.9% IV SOLUTION: Freq: Once | INTRAVENOUS | Status: AC

## 2024-02-01 MED ORDER — FLUOXETINE HCL 20 MG PO CAPS
40.0000 mg | ORAL_CAPSULE | Freq: Every day | ORAL | Status: DC
  Administered 2024-02-01 – 2024-02-03 (×3): 40 mg via ORAL
  Filled 2024-02-01 (×3): qty 2

## 2024-02-01 MED ORDER — SODIUM CHLORIDE 0.9 % IV SOLN
2.0000 g | INTRAVENOUS | Status: DC
  Administered 2024-02-02 (×2): 2 g via INTRAVENOUS
  Filled 2024-02-01 (×2): qty 20

## 2024-02-01 MED ORDER — HYDROMORPHONE HCL 1 MG/ML IJ SOLN
0.5000 mg | INTRAMUSCULAR | Status: DC | PRN
  Administered 2024-02-01 – 2024-02-02 (×2): 0.5 mg via INTRAVENOUS
  Filled 2024-02-01: qty 1
  Filled 2024-02-01: qty 0.5

## 2024-02-01 MED ORDER — INSULIN ASPART 100 UNIT/ML IJ SOLN
0.0000 [IU] | Freq: Three times a day (TID) | INTRAMUSCULAR | Status: DC
  Administered 2024-02-01 – 2024-02-02 (×2): 11 [IU] via SUBCUTANEOUS

## 2024-02-01 MED ORDER — DIPHENHYDRAMINE HCL 25 MG PO CAPS
25.0000 mg | ORAL_CAPSULE | Freq: Once | ORAL | Status: AC | PRN
  Administered 2024-02-01: 25 mg via ORAL
  Filled 2024-02-01: qty 1

## 2024-02-01 MED ORDER — LIDOCAINE-EPINEPHRINE 1 %-1:100000 IJ SOLN
INTRAMUSCULAR | Status: AC
  Filled 2024-02-01: qty 1

## 2024-02-01 MED ORDER — FLUOXETINE HCL 40 MG PO CAPS: 40.0000 mg | ORAL_CAPSULE | Freq: Two times a day (BID) | ORAL | Status: DC

## 2024-02-01 MED ORDER — POTASSIUM CHLORIDE 20 MEQ PO PACK
20.0000 meq | PACK | Freq: Once | ORAL | Status: AC
Start: 2024-02-01 — End: 2024-02-01
  Administered 2024-02-01: 20 meq via ORAL
  Filled 2024-02-01: qty 1

## 2024-02-01 MED ORDER — ROPINIROLE HCL 1 MG PO TABS
1.0000 mg | ORAL_TABLET | Freq: Every day | ORAL | Status: DC
Start: 2024-02-01 — End: 2024-02-03
  Administered 2024-02-01 – 2024-02-02 (×2): 1 mg via ORAL
  Filled 2024-02-01 (×2): qty 1

## 2024-02-01 MED ORDER — PANTOPRAZOLE SODIUM 40 MG PO TBEC
40.0000 mg | DELAYED_RELEASE_TABLET | Freq: Every day | ORAL | Status: DC
  Administered 2024-02-01 – 2024-02-03 (×3): 40 mg via ORAL
  Filled 2024-02-01 (×3): qty 1

## 2024-02-01 MED ORDER — INSULIN GLARGINE-YFGN 100 UNIT/ML ~~LOC~~ SOLN
12.0000 [IU] | Freq: Every day | SUBCUTANEOUS | Status: DC
Start: 2024-02-01 — End: 2024-02-02
  Administered 2024-02-01: 12 [IU] via SUBCUTANEOUS
  Filled 2024-02-01 (×2): qty 0.12

## 2024-02-01 MED ORDER — PROCHLORPERAZINE EDISYLATE 10 MG/2ML IJ SOLN
5.0000 mg | Freq: Once | INTRAMUSCULAR | Status: AC
  Administered 2024-02-01: 5 mg via INTRAVENOUS
  Filled 2024-02-01: qty 2

## 2024-02-01 MED ORDER — LACTULOSE 10 GM/15ML PO SOLN
20.0000 g | Freq: Two times a day (BID) | ORAL | Status: DC
  Administered 2024-02-01 – 2024-02-03 (×5): 20 g via ORAL
  Filled 2024-02-01 (×5): qty 30

## 2024-02-01 MED ORDER — ALBUMIN HUMAN 25 % IV SOLN
75.0000 g | Freq: Once | INTRAVENOUS | Status: AC
  Administered 2024-02-01: 75 g via INTRAVENOUS
  Filled 2024-02-01: qty 300

## 2024-02-01 MED ORDER — OXYCODONE HCL 5 MG PO TABS
5.0000 mg | ORAL_TABLET | Freq: Four times a day (QID) | ORAL | Status: DC | PRN
  Administered 2024-02-01: 5 mg via ORAL
  Filled 2024-02-01 (×3): qty 1

## 2024-02-01 MED ORDER — INSULIN ASPART 100 UNIT/ML IJ SOLN
3.0000 [IU] | Freq: Three times a day (TID) | INTRAMUSCULAR | Status: DC
  Administered 2024-02-01 – 2024-02-02 (×2): 3 [IU] via SUBCUTANEOUS

## 2024-02-01 NOTE — Inpatient Diabetes Management (Signed)
 Inpatient Diabetes Program Recommendations  AACE/ADA: New Consensus Statement on Inpatient Glycemic Control (2015)  Target Ranges:  Prepandial:   less than 140 mg/dL      Peak postprandial:   less than 180 mg/dL (1-2 hours)      Critically ill patients:  140 - 180 mg/dL   Lab Results  Component Value Date   GLUCAP 360 (H) 01/13/2024   HGBA1C 6.7 (A) 01/03/2024    Review of Glycemic Control  Latest Reference Range & Units 02/01/24 05:54  Glucose 70 - 99 mg/dL 666 (H)   Diabetes history: DM 2 Outpatient Diabetes medications:  Dexcom G7 Lantus  9 units daily Current orders for Inpatient glycemic control:  Semglee  12 units daily  Inpatient Diabetes Program Recommendations:    May consider adding Novolog  0-6 units tid with meals and HS.   Thanks  Randall Bullocks, RN, BC-ADM Inpatient Diabetes Coordinator Pager [phone]  (8a-5p)

## 2024-02-01 NOTE — Consult Note (Signed)
 [facility] Consult Note   History COPELAND LAPIER MRN # [id number]  Date of Admission: 01/31/2024 Date of Consultation: 02/01/2024 Referring physician: Dr. Samtani, Jai-Gurmukh, MD Primary Care Provider: [name], MD Primary Gastroenterologist: Dr. Victory Brand Hepatology: [facility]   Reason for Consultation/Chief Complaint: Fever, cough, decompensated Laurie Sutton related cirrhosis  Subjective  HPI:  43 year old woman known to me with multiple complications of Laurie Sutton related cirrhosis, most notably large-volume refractory ascites requiring weekly paracentesis and hepatic encephalopathy who was brought to the ED by her husband today for several days of persistent fever and cough that was reportedly worsening.  No sick contacts at home, no hemoptysis.  No hematemesis or melena reported. Workup thus far has included negative chest x-ray, urinalysis, respiratory panel for flu, COVID and RSV. Her main other complaint right now is progressive abdominal distention with bloating causing early satiety and dyspnea.  Janyth has been evaluated by the [facility] practice and recently approved by insurance to have continued care there and to receive a liver transplant when an organ is available.  She was recently at [facility] for complications of decompensated cirrhosis and were transferred to [facility] main facility in [place] where she spent about a week undergoing treatment before discharge home.  She has been receiving weekly paracentesis of about 8 L of ascites, but fluid reaccumulates quickly causing discomfort and the symptoms noted above. Last paracentesis prior to today was 5 days ago, then 7 days before that and approximately every 7 days prior to that for the last 2 months.  ROS:  Constitutional: Reported fever as noted above Respiratory:   Cough and dyspnea Generalized fatigue  All other systems are negative except as noted above  in the HPI  Past Medical History Past Medical History:  Diagnosis Date   ADHD    Allergy    Anemia    IRON TRANSFUSION 07-2020 NONE SINCE   Anxiety    Back pain    COVID 08/12/2019   ALL SYMPTOMS REOLVED PER PT   Depression    Diabetic neuropathy (HCC) 11/11/2020   FEET   dm type 2    se[facility] Dr. Odella Jacobson at [facility]   Fatty liver    GERD (gastroesophageal reflux disease)    History of kidney stones    Joint pain    Menorrhagia 11/11/2020   Migraines    Murmur, cardiac    FAINT NO CARDIOLOGIST   Other fatigue    Pneumonia 08/12/2019   COVID PNEUMONIA ALL SYMPTOMS RESOLVED PER PT   Shortness of breath on exertion     Past Surgical History Past Surgical History:  Procedure Laterality Date   CESAREAN SECTION  12/13/2006   COLONOSCOPY WITH PROPOFOL  N/A 08/02/2023   Procedure: COLONOSCOPY WITH PROPOFOL ;  Surgeon: Brand Victory LITTIE DOUGLAS, MD;  Location: [facility];  Service: Gastroenterology;  Laterality: N/A;   ESOPHAGOGASTRODUODENOSCOPY (EGD) WITH PROPOFOL  N/A 08/02/2023   Procedure: ESOPHAGOGASTRODUODENOSCOPY (EGD) WITH PROPOFOL ;  Surgeon: Brand Victory LITTIE DOUGLAS, MD;  Location: [facility];  Service: Gastroenterology;  Laterality: N/A;   EXTRACORPOREAL SHOCK WAVE LITHOTRIPSY  2018   FLEXIBLE SIGMOIDOSCOPY N/A 11/20/2023   Procedure: KINGSTON SIDE;  Surgeon: Albertus Gordy HERO, MD;  Location: [facility];  Service: Gastroenterology;  Laterality: N/A;   FOOT SURGERY Right 10/02/2020   RIGHT FOOT HEEL AND TOES, SURGICAL CENTER OFF [address]   IR PARACENTESIS  07/06/2023   IR PARACENTESIS  08/18/2023   IR PARACENTESIS  10/05/2023   IR  PARACENTESIS  10/11/2023   IR PARACENTESIS  10/20/2023   IR PARACENTESIS  11/08/2023   IR PARACENTESIS  11/28/2023   IR PARACENTESIS  12/09/2023   IR PARACENTESIS  12/15/2023   IR PARACENTESIS  12/16/2023   IR PARACENTESIS  12/23/2023   IR PARACENTESIS  12/30/2023   IR PARACENTESIS  01/06/2024   IR PARACENTESIS  01/06/2024   IR PARACENTESIS   01/10/2024   IR PARACENTESIS  01/13/2024   IR PARACENTESIS  01/20/2024   IR PARACENTESIS  01/27/2024   IR PARACENTESIS  02/01/2024   IR TRANSCATHETER BX  07/26/2023   IR US  GUIDE VASC ACCESS RIGHT  07/26/2023   IR VENOGRAM HEPATIC W HEMODYNAMIC EVALUATION  07/26/2023   LAPAROSCOPIC VAGINAL HYSTERECTOMY WITH SALPINGECTOMY Bilateral 11/17/2020   Procedure: LAPAROSCOPIC ASSISTED VAGINAL HYSTERECTOMY WITH BILATERAL SALPINGECTOMY;  Surgeon: Dannielle Bouchard, DO;  Location: [facility];  Service: Gynecology;  Laterality: Bilateral;   POLYPECTOMY  08/02/2023   Procedure: POLYPECTOMY, INTESTINE;  Surgeon: Legrand Victory LITTIE DOUGLAS, MD;  Location: [facility];  Service: Gastroenterology;;    Family History Family History  Problem Relation Age of Onset   Colon polyps Mother    Depression Mother    Anxiety disorder Mother    Colon polyps Father    Sleep apnea Father    Anxiety disorder Father    Depression Father    Diabetes Father    Bladder Cancer Father 56   Rheum arthritis Father    Migraines Maternal Aunt    Migraines Maternal Grandmother    Diabetes Maternal Grandfather    Healthy Daughter    Asthma Daughter    Anxiety disorder Daughter    Anxiety disorder Son    Asthma Son    Healthy Son    Cerebral aneurysm Cousin    Heart disease Other    Depression Other    Anxiety disorder Other    Sleep apnea Other    Colon cancer Neg Hx    Esophageal cancer Neg Hx    Stomach cancer Neg Hx    Rectal cancer Neg Hx     Social History Social History   Socioeconomic History   Marital status: Married    Spouse name: Adam   Number of children: 2   Years of education: Not on file   Highest education level: 12th grade  Occupational History   Occupation: Arts administrator and Nutrition    Employer: [employer]    Comment: works 20 hours per week  Tobacco Use   Smoking status: Never    Passive exposure: Current   Smokeless tobacco: Never  Vaping Use   Vaping status: Never Used   Substance and Sexual Activity   Alcohol use: No    Alcohol/week: 0.0 standard drinks of alcohol   Drug use: No   Sexual activity: Yes    Partners: Male    Birth control/protection: None  Other Topics Concern   Not on file  Social History Narrative   Not on file   Social Drivers of Health   Financial Resource Strain: Low Risk  (09/19/2022)   Overall Financial Resource Strain (CARDIA)    Difficulty of Paying Living Expenses: Not hard at all  Food Insecurity: No Food Insecurity (02/01/2024)   Hunger Vital Sign    Worried About Running Out of Food in the Last Year: Never true    Ran Out of Food in the Last Year: Never true  Transportation Needs: No Transportation Needs (02/01/2024)   PRAPARE - Transportation  Lack of Transportation (Medical): No    Lack of Transportation (Non-Medical): No  Physical Activity: Insufficiently Active (06/04/2021)   Exercise Vital Sign    Days of Exercise per Week: 2 days    Minutes of Exercise per Session: 10 min  Stress: Stress Concern Present (09/19/2022)   [employer] of Occupational Health - Occupational Stress Questionnaire    Feeling of Stress : To some extent  Social Connections: Socially Integrated (10/05/2023)   Social Connection and Isolation Panel    Frequency of Communication with Friends and Family: More than three times a week    Frequency of Social Gatherings with Friends and Family: Twice a week    Attends Religious Services: More than 4 times per year    Active Member of [employer] or Organizations: No    Attends Engineer, structural: More than 4 times per year    Marital Status: Married    Allergies Allergies  Allergen Reactions   Vancomycin  Itching   Amoxicillin Hives and Itching   Azithromycin      DOES NOT WORK   Dulaglutide  Other (See Comments)    constipation and stomach issues  **Trulicity **    Empagliflozin -Metformin  Hcl Er Other (See Comments) and Swelling   Januvia  [Sitagliptin ] Other (See Comments)     HURTS STOAMCH   Latex     IRRITATES VAGINAL AREA   Metformin      Other Reaction(s): achy all over   Morphine  Itching    Severe    Penicillins Hives    Has patient had a PCN reaction causing immediate rash, facial/tongue/throat swelling, SOB or lightheadedness with hypotension: yes. Rash  Has patient had a PCN reaction causing severe rash involving mucus membranes or skin necrosis: Yes- rash and hives all over body  Has patient had a PCN reaction that required hospitalization No Has patient had a PCN reaction occurring within the last 10 years: Yes  If all of the above answers are NO, then may proceed with Cephalosporin use.    Vitamin K  And Related Other (See Comments)    Electric shock     Outpatient Meds Home medications from the H+P and/or nursing med reconciliation reviewed.  Inpatient med list reviewed  _____________________________________________________________________ Objective   Exam:  Current vital signs  Patient Vitals for the past 8 hrs:  BP Temp Temp src Pulse Resp SpO2  02/01/24 1545 (!) 103/56 -- -- -- -- --  02/01/24 1530 (!) 106/47 -- -- -- 18 --  02/01/24 1500 -- -- -- -- -- 98 %  02/01/24 1445 (!) 101/52 -- -- -- 16 --  02/01/24 1430 (!) 95/47 -- -- -- 15 --  02/01/24 1415 (!) 94/46 -- -- -- 17 --  02/01/24 1355 (!) 98/50 98.4 F (36.9 C) Oral -- (!) 22 --  02/01/24 1344 -- -- -- -- 16 --  02/01/24 1230 122/73 -- -- -- 16 --  02/01/24 1222 (!) 106/56 98.1 F (36.7 C) Oral 87 16 97 %  02/01/24 1220 (!) 108/56 -- -- 87 16 97 %  02/01/24 1215 (!) 106/54 -- -- 88 15 --  02/01/24 1213 109/60 -- -- 88 15 --  02/01/24 1212 -- 98.1 F (36.7 C) Oral 94 19 99 %  02/01/24 1210 (!) 93/49 -- -- 89 13 --  02/01/24 1200 (!) 117/107 -- -- (!) 101 (!) 23 97 %  02/01/24 1159 -- -- -- -- -- 97 %  02/01/24 1149 (!) 112/55 98 F (36.7 C) Oral -- -- --  02/01/24  1134 (!) 112/59 -- -- -- -- --  02/01/24 1000 114/61 -- -- -- -- --  02/01/24 0945 108/63 -- -- --  -- --    Intake/Output Summary (Last 24 hours) at 02/01/2024 1625 Last data filed at 02/01/2024 1207 Gross per 24 hour  Intake 1395.59 ml  Output 8500 ml  Net -7104.41 ml    Physical Exam:   General: this is a somnolent and chronically ill patient.  She is easily aroused and conversational but with sluggish mentation and then goes back to sleep. Eyes: sclera mildly icteric, no redness ENT: oral mucosa moist without lesions, no cervical or supraclavicular lymphadenopathy, CV: RRR with a soft systolic murmur, D8/D7, no JVD,, no peripheral edema Resp: clear to auscultation bilaterally, normal RR and effort noted GI: soft, markedly distended with ascites, no focal tenderness.  Left lobe liver enlarged, cannot assess splenomegaly given degree of ascites Skin; warm and dry,, mildly jaundiced scattered ecchymoses Neuro: Somnolent, mentation sluggish with slow speech.  She does recognize me and is oriented.  Positive asterixis Labs:     Latest Ref Rng & Units 02/01/2024    5:54 AM 01/31/2024   10:02 PM 01/13/2024    1:03 PM  CBC  WBC 4.0 - 10.5 K/uL 1.7  2.1    Hemoglobin 12.0 - 15.0 g/dL 6.9  8.8  8.2   Hematocrit 36.0 - 46.0 % 22.0  27.5  24.0   Platelets 150 - 400 K/uL 63  70         Latest Ref Rng & Units 02/01/2024    5:54 AM 01/31/2024   10:02 PM 01/13/2024    1:03 PM  CMP  Glucose 70 - 99 mg/dL 666  664    BUN 6 - 20 mg/dL 6  7    Creatinine 9.55 - 1.00 mg/dL 9.24  9.23    Sodium 864 - 145 mmol/L 129  128  133   Potassium 3.5 - 5.1 mmol/L 3.4  3.3  3.7   Chloride 98 - 111 mmol/L 103  99    CO2 22 - 32 mmol/L 21  20    Calcium  8.9 - 10.3 mg/dL 7.8  8.0    Total Protein 6.5 - 8.1 g/dL 5.7  6.7    Total Bilirubin 0.0 - 1.2 mg/dL 2.8  2.9    Alkaline Phos 38 - 126 U/L 94  129    AST 15 - 41 U/L 34  38    ALT 0 - 44 U/L 23  25      Recent Labs  Lab 02/01/24 0554  INR 1.8*   _________________________________________________________ Radiologic studies: Ultrasound  report reviewed from today with a liter paracentesis performed.  Ascites albumin  level less than 1.5, total protein less than 3, fluid reportedly clear, total nucleated cell count 64  ______________________________________________________ Other studies:  Clear portable chest x-ray (image reviewed) _______________________________________________________ Assessment & Plan  Impression:  Decompensated Laurie Sutton related cirrhosis with large volume refractory ascites requiring frequent paracentesis as well as hepatic encephalopathy.  Reported fever and cough, workup negative thus far for infectious source.  Will check to be sure blood cultures were drawn.  (Has a soft systolic murmur, must rule out endocarditis)  Acute on chronic anemia without overt GI bleeding.  Transfuse 1 unit PRBCs, will follow serial hemoglobins Pancytopenia related cirrhosis  Her abdomen is still markedly protuberant and distended with ascites, and she may need further paracentesis prior to discharge.  Will have to follow renal function and electrolytes and hemodynamics  to see when she and if she might tolerate that  [facility]ology service has been contacted for potential transfer to their institution.  From what understand, they may not have a bed available at present.  Ethelyn will be admitted here to the internal medicine service with our service also following and making recommendations.  Further infectious workup is appropriate.  Continue her home doses of lactulose  and rifaximin .  This consultation required a high degree of medical decision making due to the nature and complexity of the acute condition(s) being evaluated as well as the patient's medical comorbidities.  Victory LITTIE Brand III Office: [phone]

## 2024-02-01 NOTE — ED Notes (Signed)
 1700 CBG 355. Result from monitor not crossing over

## 2024-02-01 NOTE — ED Notes (Signed)
 Breathing is even and unlabored. PT resting on left side at this time. Chest falling and rising. PT resting with eyes closed at this time.

## 2024-02-01 NOTE — ED Notes (Signed)
 PT to IR

## 2024-02-01 NOTE — ED Notes (Signed)
 CCMD called.

## 2024-02-01 NOTE — Progress Notes (Signed)
 Physician Connection Line - Call Note  Dr Franky @ [facility], Dx: Decompensated liver cirrhosis   Patient Name:  Laurie Sutton Age:  43 y.o. Liver Disease: MASLD   Pertinent history:   Laurie Sutton is a 42 y.o. female with history of liver cirrhosis secondary to MASH with history of portal hypertensive gastropathy, esophageal varices, scites who presents to OSH for fever, and weight gain, shortness of breath.  She was found to be afebrile, underwent diagnostic paracentesis which did not reveal SBP. She was admitted for decompensated cirrhosis management. Hgb 6.8, no GI bleeding reported. She is listed for OLT with MELD of 24, blood group O.    - Current MELD:   MELD 3.0: 19 at 01/19/2024  9:43 AM MELD-Na: 19 at 01/19/2024  9:43 AM Calculated from: Serum Creatinine: 0.74 mg/dL (Using min of 1 mg/dL) at 89/0/7974  7:61 AM Serum Sodium: 132 mmol/L at 01/19/2024  2:38 AM Total Bilirubin: 2.2 mg/dL at 89/0/7974  7:61 AM Serum Albumin : 3.2 g/dL at 89/0/7974  7:61 AM INR(ratio): 1.6 at 01/19/2024  9:43 AM Age at listing (hypothetical): 42 years Sex: Female at 01/19/2024  9:43 AM   Labs:  sCr 0.75 tB 2.8 ALB 2.5 Hgb 6.9 INR 1.8  Transplant status:  On the waiting list  Assessment:   Decompensated MASLD Cirrhosis    Recommendations:  Listed as of today with MELD of 24, this patient needs infectious work up, blood transfusion, management of volume overload. This can be done at current hospital, though given she is listed for LT, and has high chance to decompensate and this may increase her MELD, would advise transfer to [facility]. Appreciate outside hospital care of this patient, patient to be admitted to medicine service and hospital medicine to see in consultation.   Transfer to [facility] is recommended at this time. Please reach back out with questions if they ari  D/w Dr. Noralyn, and transplant coordinator   [facility]  Note that  patients are not deemed to be transplant candidates until assessed by the hepatology service.  We may accept patients that initially appear to be candidates whereas on further assessment are not.   Do not discuss transplant candidacy with patient or family.   Morene Essex, DO Hepatology Fellow

## 2024-02-01 NOTE — Progress Notes (Signed)
 The candidate was typed on two separate occasions prior to listing for a Liver transplant. Using all available source documents, I validated that the blood type, patient name, and date of birth or medical record number on the lab reports match this specific information entered into [facility].   Lab Results  Component Value Date/Time   ABORH O Positive 12/08/2023 11:33 AM   Component 2 mo ago  ABO/RH(D) O POS  Antibody Screen NEG  Sample Expiration 11/21/2023,2359 Performed at [facility], [address], [place], [place] [address]  Resulting Agency [facility] <redacted file path>  Specimen Collected: 11/18/23 11:35    Performed by: [facility] <redacted file path> Last Resulted: 11/18/23 13:17  Received From: [facility]  Result Received: 11/21/23 08:49      Social Security Wlfazm:[id number] Social Security number was validated via source documents.    Comer Poet, RN 02/01/24 10:17 AM

## 2024-02-01 NOTE — Telephone Encounter (Signed)
 Called and spoke with patient/support to inform them that patient is now listed on the liver transplant waitlist with a MELD score of 24. Discussed the importance of patient and their support having their phones on and volume up at all times so that we can reach them with organ offers. Also informed patient to keep us  updated if there are any changes to their support, phone number, or insurance and to notify us  of any hospitalizations, illnesses, or medication changes. Patient/support v/u. We also discussed the need for regularly updated MELD labs. Patient will need standing labs orders placed and will need to be notified of lab frequency.  All questions were answered and formal listing letter was sent via MyChart.

## 2024-02-01 NOTE — H&P (Signed)
 History and Physical    Laurie Sutton FMW:[id number] DOB: July 30, 1980 DOA: 01/31/2024  Patient coming from: Home.  Chief Complaint: Fever and abdominal pain.  HPI: Laurie Sutton is a 43 y.o. female with history of liver cirrhosis secondary to [facility] with history of portal hypertensive gastropathy, esophageal varices, recurrent admission for ascites and also hepatic encephalopathy was recently discharged from [facility] on January 19, 2024 when patient was admitted for hepatic encephalopathy at that time due to patient having low blood pressure patient's diuretics Lasix  and spironolactone  were held and was started on midodrine  has been having paracentesis every week on Fridays and last one about 5 days ago presents to the ER with complaints of abdominal discomfort and fever.  Patient states over the last 3 days patient has been experiencing fever and chills and had a temperature of around 102 F and decided to come to the ER.  Has some nausea denies any vomiting or diarrhea.  Has been taking her home medications.  ED Course: In the ER patient is afebrile.  On exam patient has distended abdomen.  Labs show sodium of 128 creatinine 0.7 blood glucose of 335 bicarb of 20 lipase 73 ammonia 76 total bilirubin of 2.9 lactic is 1.8 hemoglobin 8.8 platelets 70 WBC 2.1.  Patient had diagnostic paracentesis done by the ER physician which did not show any bacteria or WBC.  COVID and flu test were negative.  CT abdomen pelvis shows cirrhotic changes with ascites and cholelithiasis.  Patient admitted for decompensated liver cirrhosis.  Review of Systems: As per HPI, rest all negative.   Past Medical History:  Diagnosis Date   ADHD    Allergy    Anemia    IRON TRANSFUSION 07-2020 NONE SINCE   Anxiety    Back pain    COVID 08/12/2019   ALL SYMPTOMS REOLVED PER PT   Depression    Diabetic neuropathy (HCC) 11/11/2020   FEET   dm type 2    sees Dr. Odella Jacobson at [facility]    Fatty liver    GERD (gastroesophageal reflux disease)    History of kidney stones    Joint pain    Menorrhagia 11/11/2020   Migraines    Murmur, cardiac    FAINT NO CARDIOLOGIST   Other fatigue    Pneumonia 08/12/2019   COVID PNEUMONIA ALL SYMPTOMS RESOLVED PER PT   Shortness of breath on exertion     Past Surgical History:  Procedure Laterality Date   CESAREAN SECTION  12/13/2006   COLONOSCOPY WITH PROPOFOL  N/A 08/02/2023   Procedure: COLONOSCOPY WITH PROPOFOL ;  Surgeon: Legrand Laurie LITTIE DOUGLAS, MD;  Location: [facility];  Service: Gastroenterology;  Laterality: N/A;   ESOPHAGOGASTRODUODENOSCOPY (EGD) WITH PROPOFOL  N/A 08/02/2023   Procedure: ESOPHAGOGASTRODUODENOSCOPY (EGD) WITH PROPOFOL ;  Surgeon: Legrand Laurie LITTIE DOUGLAS, MD;  Location: [facility];  Service: Gastroenterology;  Laterality: N/A;   EXTRACORPOREAL SHOCK WAVE LITHOTRIPSY  2018   FLEXIBLE SIGMOIDOSCOPY N/A 11/20/2023   Procedure: KINGSTON SIDE;  Surgeon: Laurie Gordy HERO, MD;  Location: [facility];  Service: Gastroenterology;  Laterality: N/A;   FOOT SURGERY Right 10/02/2020   RIGHT FOOT HEEL AND TOES, SUR[facility]ENTER OFF [address]   IR PARACENTESIS  07/06/2023   IR PARACENTESIS  08/18/2023   IR PARACENTESIS  10/05/2023   IR PARACENTESIS  10/11/2023   IR PARACENTESIS  10/20/2023   IR PARACENTESIS  11/08/2023   IR PARACENTESIS  11/28/2023   IR PARACENTESIS  12/09/2023   IR PARACENTESIS  12/15/2023   IR PARACENTESIS  12/16/2023   IR PARACENTESIS  12/23/2023   IR PARACENTESIS  12/30/2023   IR PARACENTESIS  01/06/2024   IR PARACENTESIS  01/06/2024   IR PARACENTESIS  01/10/2024   IR PARACENTESIS  01/13/2024   IR PARACENTESIS  01/20/2024   IR PARACENTESIS  01/27/2024   IR TRANSCATHETER BX  07/26/2023   IR US  GUIDE VASC ACCESS RIGHT  07/26/2023   IR VENOGRAM HEPATIC W HEMODYNAMIC EVALUATION  07/26/2023   LAPAROSCOPIC VAGINAL HYSTERECTOMY WITH SALPINGECTOMY Bilateral 11/17/2020   Procedure: LAPAROSCOPIC ASSISTED VAGINAL HYSTERECTOMY  WITH BILATERAL SALPINGECTOMY;  Surgeon: Dannielle Bouchard, DO;  Location: [facility];  Service: Gynecology;  Laterality: Bilateral;   POLYPECTOMY  08/02/2023   Procedure: POLYPECTOMY, INTESTINE;  Surgeon: Legrand Laurie LITTIE DOUGLAS, MD;  Location: [facility];  Service: Gastroenterology;;     reports that she has never smoked. She has been exposed to tobacco smoke. She has never used smokeless tobacco. She reports that she does not drink alcohol and does not use drugs.  Allergies  Allergen Reactions   Vancomycin  Itching   Amoxicillin Hives and Itching   Azithromycin      DOES NOT WORK   Dulaglutide  Other (See Comments)    constipation and stomach issues  **Trulicity **    Empagliflozin -Metformin  Hcl Er Other (See Comments) and Swelling   Januvia  [Sitagliptin ] Other (See Comments)    HURTS STOAMCH   Latex     IRRITATES VAGINAL AREA   Metformin      Other Reaction(s): achy all over   Morphine  Itching    Severe    Penicillins Hives    Has patient had a PCN reaction causing immediate rash, facial/tongue/throat swelling, SOB or lightheadedness with hypotension: yes. Rash  Has patient had a PCN reaction causing severe rash involving mucus membranes or skin necrosis: Yes- rash and hives all over body  Has patient had a PCN reaction that required hospitalization No Has patient had a PCN reaction occurring within the last 10 years: Yes  If all of the above answers are NO, then may proceed with Cephalosporin use.    Vitamin K  And Related Other (See Comments)    Electric shock     Family History  Problem Relation Age of Onset   Colon polyps Mother    Depression Mother    Anxiety disorder Mother    Colon polyps Father    Sleep apnea Father    Anxiety disorder Father    Depression Father    Diabetes Father    Bladder Cancer Father 24   Rheum arthritis Father    Migraines Maternal Aunt    Migraines Maternal Grandmother    Diabetes Maternal Grandfather    Healthy Daughter     Asthma Daughter    Anxiety disorder Daughter    Anxiety disorder Son    Asthma Son    Healthy Son    Cerebral aneurysm Cousin    Heart disease Other    Depression Other    Anxiety disorder Other    Sleep apnea Other    Colon cancer Neg Hx    Esophageal cancer Neg Hx    Stomach cancer Neg Hx    Rectal cancer Neg Hx     Prior to Admission medications   Medication Sig Start Date End Date Taking? Authorizing Provider  acetaminophen  (TYLENOL ) 325 MG tablet Take 650 mg by mouth every 6 (six) hours as needed for mild pain (pain score 1-3).    [provider]  albuterol  (VENTOLIN  HFA) 108 (90 Base) MCG/ACT inhaler INHALE 2 PUFFS INTO THE LUNGS UP TO EVERY 6 HOURS AS NEEDED FOR WHEEZING/SHORTNESS OF BREATH Patient not taking: Reported on 01/20/2024 08/20/22   [name], MD  bisacodyl  (DULCOLAX) 5 MG EC tablet Take 2 tablets (10 mg total) by mouth daily as needed for moderate constipation. Patient not taking: Reported on 01/20/2024 12/19/23   Arlice Reichert, MD  Blood Glucose Monitoring Suppl DEVI 1 each by Does not apply route 3 (three) times daily. May dispense any manufacturer covered by patient's insurance. 01/11/24   Rojelio Nest, DO  calcium  carbonate (TUMS - DOSED IN MG ELEMENTAL CALCIUM ) 500 MG chewable tablet Chew 2 tablets by mouth as needed for indigestion or heartburn. Patient not taking: Reported on 01/20/2024    [provider]  clotrimazole -betamethasone  (LOTRISONE ) cream APPLY 1 APPLICATION TOPICALLY TWICE A DAY AS NEEDED Patient not taking: Reported on 01/20/2024 05/26/23   [name], MD  Continuous Glucose Sensor (DEXCOM G7 SENSOR) MISC Use 1 sensor for continuous glucose monitoring every 10 days for 30 days 05/25/23   Braulio Hough, MD  ferrous sulfate 325 (65 FE) MG EC tablet Take 325 mg by mouth daily with breakfast.    [provider]  FLUoxetine  (PROZAC ) 40 MG capsule Take 1 capsule (40 mg total) by mouth 2 (two) times daily. 01/03/24   [name], MD  furosemide  (LASIX ) 80 MG tablet Take 1 tablet (80 mg total) by mouth daily. Patient not taking: Reported on 01/20/2024 10/11/23   Sheikh, Omair Latif, DO  Glucagon (GVOKE HYPOPEN 2-PACK) 1 MG/0.2ML SOAJ Inject 1 mg into the skin as needed for up to 2 doses (Severe low blood sugar). Patient not taking: Reported on 01/20/2024 01/11/24   Rojelio Nest, DO  Glucose Blood (BLOOD GLUCOSE TEST STRIPS) STRP 1 each by Does not apply route 3 (three) times daily. Use as directed to check blood sugar. May dispense any manufacturer covered by patient's insurance and fits patient's device. 01/11/24   Rojelio Nest, DO  hydrocortisone  2.5 % cream Use 3 (three) times daily rectally 11/22/23   Singh, Prashant K, MD  HYDROmorphone  (DILAUDID ) 2 MG tablet Take 0.5 tablets (1 mg total) by mouth every 6 (six) hours as needed for severe pain (pain score 7-10). Patient not taking: Reported on 01/20/2024 01/06/24   Garrick Charleston, MD  insulin  aspart (NOVOLOG ) 100 UNIT/ML FlexPen Inject 0-6 Units into the skin 3 (three) times daily with meals. Check Blood Glucose (BG) and inject per scale: BG <150= 0 unit; BG 150-200= 1 unit; BG 201-250= 2 unit; BG 251-300= 3 unit; BG 301-350= 4 unit; BG 351-400= 5 unit; BG >400= 6 unit and Call Primary Care. 01/11/24   Rojelio Nest, DO  insulin  glargine (LANTUS ) 100 UNIT/ML Solostar Pen Inject 9 Units into the skin daily. May substitute as needed per insurance. 01/11/24   Rojelio Nest, DO  Insulin  Pen Needle (PEN NEEDLES) 31G X 5 MM MISC 1 each by Does not apply route 3 (three) times daily. May dispense any manufacturer covered by patient's insurance. 01/11/24   Rojelio Nest, DO  lactulose  (CHRONULAC ) 10 GM/15ML solution Take 30 mLs (20 g total) by mouth 4 (four) times daily. Patient taking differently: Take 20 g by mouth 2 (two) times daily. For 10 days per discharge instructions from [facility] on 01/19/2024 12/19/23 03/18/24  Arlice Reichert, MD  Lancet Device MISC 1 each by  Does not apply route 3 (three) times daily. May dispense any  manufacturer covered by [employer]. 01/11/24   Rojelio Nest, DO  Lancets MISC 1 each by Does not apply route 3 (three) times daily. Use as directed to check blood sugar. May dispense any manufacturer covered by patient's insurance and fits patient's device. 01/11/24   Rojelio Nest, DO  lidocaine  (LIDODERM ) 5 % Place 1 patch onto the skin daily. Remove & Discard patch within 12 hours or as directed by MD    [provider]  LORazepam  (ATIVAN ) 0.5 MG tablet Take 0.5 mg by mouth at bedtime. Patient not taking: Reported on 01/20/2024    [provider]  midodrine  (PROAMATINE ) 10 MG tablet Take 1 tablet (10 mg total) by mouth 2 (two) times daily with a meal. Patient taking differently: Take 15 mg by mouth 3 (three) times daily. Takes 5 mg tablets.  3 tablets ( 15mg ) three times per day per discharge instructions on 01/19/2024  [facility] 12/19/23 03/18/24  Arlice Reichert, MD  montelukast  (SINGULAIR ) 10 MG tablet Take 1 tablet (10 mg total) by mouth at bedtime. 08/25/22   [name], MD  omeprazole  (PRILOSEC) 40 MG capsule TAKE 1 CAPSULE (40 MG TOTAL) BY MOUTH IN THE MORNING 01/27/24   Legrand Laurie LITTIE DOUGLAS, MD  ondansetron  (ZOFRAN -ODT) 4 MG disintegrating tablet Take 1 tablet (4 mg total) by mouth every 8 (eight) hours as needed for nausea or vomiting. 01/06/24   Garrick Charleston, MD  prochlorperazine  (COMPAZINE ) 10 MG tablet Take 1 tablet (10 mg total) by mouth every 8 (eight) hours as needed for nausea or vomiting. Patient not taking: Reported on 01/20/2024 12/29/23   Legrand Laurie LITTIE DOUGLAS, MD  rOPINIRole  (REQUIP ) 1 MG tablet Take 1 tablet (1 mg total) by mouth at bedtime. 01/03/24   [name], MD  spironolactone  (ALDACTONE ) 100 MG tablet Take 3 tablets (300 mg total) by mouth daily. Patient not taking: Reported on 01/20/2024 11/01/23 04/11/24  Sira, Zackery, MD  Vitamin D , Ergocalciferol , (DRISDOL ) 1.25 MG (50000  UNIT) CAPS capsule Take 50,000 Units by mouth every 7 (seven) days. No specific day 11/19/23   [provider]  XIFAXAN  550 MG TABS tablet Take 550 mg by mouth in the morning. 10/27/23   [provider]  zinc sulfate, 50mg  elemental zinc, 220 (50 Zn) MG capsule Take 220 mg by mouth 2 (two) times daily.    [provider]    Physical Exam: Constitutional: Moderately built and nourished. Vitals:   02/01/24 0115 02/01/24 0125 02/01/24 0330 02/01/24 0345  BP: 111/68  107/71 110/62  Pulse: 92  93 92  Resp: 16  18 19   Temp:  98.2 F (36.8 C)    TempSrc:  Oral    SpO2: 96%  97% 94%   Eyes: Anicteric no pallor. ENMT: No discharge from the ears eyes nose or mouth. Neck: No mass felt.  No neck rigidity. Respiratory: No rhonchi or crepitations. Cardiovascular: S1 S2 heard. Abdomen: Distended and nontender bowel sounds present. Musculoskeletal: No edema. Skin: No rash. Neurologic: Alert awake oriented to time place and person.  Moves all extremities. Psychiatric: Appears normal.  Normal affect.   Labs on Admission: I have personally reviewed following labs and imaging studies  CBC: Recent Labs  Lab 01/31/24 2202  WBC 2.1*  NEUTROABS 1.3*  HGB 8.8*  HCT 27.5*  MCV 90.2  PLT 70*   Basic Metabolic Panel: Recent Labs  Lab 01/31/24 2202  NA 128*  K 3.3*  CL 99  CO2 20*  GLUCOSE 335*  BUN 7  CREATININE 0.76  CALCIUM  8.0*   GFR: CrCl cannot be calculated (Unknown ideal weight.). Liver Function Tests: Recent Labs  Lab 01/31/24 2202  AST 38  ALT 25  ALKPHOS 129*  BILITOT 2.9*  PROT 6.7  ALBUMIN  3.0*   Recent Labs  Lab 01/31/24 2202  LIPASE 73*   Recent Labs  Lab 01/31/24 2202  AMMONIA 76*   Coagulation Profile: Recent Labs  Lab 01/31/24 2202  INR 1.8*   Cardiac Enzymes: No results for input(s): CKTOTAL, CKMB, CKMBINDEX, TROPONINI in the last 168 hours. BNP (last 3 results) No results for input(s): PROBNP in the last  8760 hours. HbA1C: No results for input(s): HGBA1C in the last 72 hours. CBG: No results for input(s): GLUCAP in the last 168 hours. Lipid Profile: No results for input(s): CHOL, HDL, LDLCALC, TRIG, CHOLHDL, LDLDIRECT in the last 72 hours. Thyroid  Function Tests: No results for input(s): TSH, T4TOTAL, FREET4, T3FREE, THYROIDAB in the last 72 hours. Anemia Panel: No results for input(s): VITAMINB12, FOLATE, FERRITIN, TIBC, IRON, RETICCTPCT in the last 72 hours. Urine analysis:    Component Value Date/Time   COLORURINE YELLOW 02/01/2024 0102   APPEARANCEUR CLEAR 02/01/2024 0102   LABSPEC >1.046 (H) 02/01/2024 0102   PHURINE 6.0 02/01/2024 0102   GLUCOSEU 150 (A) 02/01/2024 0102   HGBUR NEGATIVE 02/01/2024 0102   BILIRUBINUR NEGATIVE 02/01/2024 0102   BILIRUBINUR neg 01/03/2024 1351   KETONESUR NEGATIVE 02/01/2024 0102   PROTEINUR NEGATIVE 02/01/2024 0102   UROBILINOGEN 1.0 01/03/2024 1351   UROBILINOGEN 1.0 12/21/2014 1020   NITRITE NEGATIVE 02/01/2024 0102   LEUKOCYTESUR NEGATIVE 02/01/2024 0102   Sepsis Labs: @LABRCNTIP (procalcitonin:4,lacticidven:4) ) Recent Results (from the past 240 hours)  Resp panel by RT-PCR (RSV, Flu A&B, Covid) Anterior Nasal Swab     Status: None   Collection Time: 01/31/24 10:02 PM   Specimen: Anterior Nasal Swab  Result Value Ref Range Status   SARS Coronavirus 2 by RT PCR NEGATIVE NEGATIVE Final   Influenza A by PCR NEGATIVE NEGATIVE Final   Influenza B by PCR NEGATIVE NEGATIVE Final    Comment: (NOTE) The Xpert Xpress SARS-CoV-2/FLU/RSV plus assay is intended as an aid in the diagnosis of influenza from Nasopharyngeal swab specimens and should not be used as a sole basis for treatment. Nasal washings and aspirates are unacceptable for Xpert Xpress SARS-CoV-2/FLU/RSV testing.  Fact Sheet for Patients: BloggerCourse.com  Fact Sheet for Healthcare  Providers: SeriousBroker.it  This test is not yet approved or cleared by the [place]  FDA and has been authorized for detection and/or diagnosis of SARS-CoV-2 by FDA under an Emergency Use Authorization (EUA). This EUA will remain in effect (meaning this test can be used) for the duration of the COVID-19 declaration under Section 564(b)(1) of the Act, 21 U.S.C. section 360bbb-3(b)(1), unless the authorization is terminated or revoked.     Resp Syncytial Virus by PCR NEGATIVE NEGATIVE Final    Comment: (NOTE) Fact Sheet for Patients: BloggerCourse.com  Fact Sheet for Healthcare Providers: SeriousBroker.it  This test is not yet approved or cleared by the [place]  FDA and has been authorized for detection and/or diagnosis of SARS-CoV-2 by FDA under an Emergency Use Authorization (EUA). This EUA will remain in effect (meaning this test can be used) for the duration of the COVID-19 declaration under Section 564(b)(1) of the Act, 21 U.S.C. section 360bbb-3(b)(1), unless the authorization is terminated or revoked.  Performed at [facility], [address] [place], [place] [address]  Peritoneal fluid culture w Gram Stain     Status: None (Preliminary result)   Collection Time: 01/31/24 11:33 PM   Specimen: Peritoneal Washings; Peritoneal Fluid  Result Value Ref Range Status   Specimen Description PERITONEAL FLUID  Final   Special Requests NONE  Final   Gram Stain   Final    NO WBC SEEN NO ORGANISMS SEEN Performed at [facility], [address] [place], [place] [address]    Culture PENDING  Incomplete   Report Status PENDING  Incomplete     Radiological Exams on Admission: CT ABDOMEN PELVIS W CONTRAST Result Date: 02/01/2024 CLINICAL DATA:  Acute abdominal pain and abdominal distension EXAM: CT ABDOMEN AND PELVIS WITH CONTRAST TECHNIQUE: Multidetector CT imaging of the abdomen  and pelvis was performed using the standard protocol following bolus administration of intravenous contrast. RADIATION DOSE REDUCTION: This exam was performed according to the departmental dose-optimization program which includes automated exposure control, adjustment of the mA and/or kV according to patient size and/or use of iterative reconstruction technique. CONTRAST:  75mL OMNIPAQUE  IOHEXOL  350 MG/ML SOLN COMPARISON:  01/06/2024 FINDINGS: Lower chest: No acute abnormality. Hepatobiliary: Liver is nodular and shrunken consistent with underlying cirrhotic change. Considerable ascites is identified. The gallbladder demonstrates dependent gallstones stable in appearance from the prior exam. No biliary ductal dilatation is seen. Pancreas: Unremarkable. No pancreatic ductal dilatation or surrounding inflammatory changes. Spleen: Spleen remains enlarged in size. Dilatation of the splenic vein is noted. Adrenals/Urinary Tract: Adrenal glands are within normal limits. Kidneys demonstrate a normal enhancement pattern bilaterally. No renal calculi or obstructive changes are seen. The bladder is well distended. Stomach/Bowel: No obstructive or inflammatory changes of the colon are seen. Mild scattered fecal material is seen. The appendix is air filled. Small bowel and stomach appear within normal limits. Small paraesophageal varices are seen. Vascular/Lymphatic: Prominence of the portal vein is noted consistent with the underlying portal hypertension. Mild para esophageal varices are seen. Recanalization of the umbilical vein is noted but diminutive size similar to that seen on prior exam. Reproductive: Uterus has been surgically removed. Ovaries appear within normal limits bilaterally. Other: Significant ascites is noted consistent with the underlying portal hypertension. This is increased when compared prior exam despite interval paracentesis. Musculoskeletal: No acute or significant osseous findings. IMPRESSION:  Cirrhotic change of the liver with underlying portal hypertension and ascites. Splenomegaly is noted as well. Cholelithiasis stable from the prior study. No acute abnormality noted. Electronically Signed   By: Oneil Devonshire M.D.   On: 02/01/2024 00:13   DG Chest Port 1 View Result Date: 01/31/2024 CLINICAL DATA:  Cough and fever EXAM: PORTABLE CHEST 1 VIEW COMPARISON:  01/13/2024 FINDINGS: The heart size and mediastinal contours are within normal limits. Both lungs are clear. The visualized skeletal structures are unremarkable. IMPRESSION: No active disease. Electronically Signed   By: Luke Bun M.D.   On: 01/31/2024 21:41     Assessment/Plan Active Problems:   Chronic hypotension   Pancytopenia (HCC)   Anxiety and depression   GERD (gastroesophageal reflux disease)   DM (diabetes mellitus) (HCC)   Liver cirrhosis secondary to NASH (HCC)   Decompensated cirrhosis (HCC)    Decompensated liver cirrhosis with large ascites with patient complaining of fevers.  Diagnostic paracentesis did not show any WBC count or bacteria.  Patient does have a large ascites.  Will order ultrasound-guided paracentesis.  Follow labs.  Patient is presently not on diuretics due to low normal blood pressure.  Continue midodrine .  Will continue empiric antibiotics for now.  Will consult GI.  MELD score of 17.  Patient states she has recently been accepted for transplant list.  Continue Xifaxan  and lactulose  ammonia levels around 76. Diabetes mellitus type 2 uncontrolled with hyperglycemia patient used to be on insulin  500 was recently changed to Lantus  patient states his sugars have been elevated after recent change.  Takes 9 units have increased to 12 units with sliding scale coverage.  Last hemoglobin A1c was 6.9. Pancytopenia likely related to cirrhosis.  Follow CBC closely. GERD on PPI. Depression on fluoxetine . Restless leg syndrome on Requip . Hypokalemia replace and recheck. Hyponatremia likely from fluid  overload.  Follow-up metabolic panel.  Since patient has decompensated liver cirrhosis will need close monitoring further workup and more than 2 midnight stay.   DVT prophylaxis: SCDs. Code Status: Full code. Family Communication: discussed with patient. Disposition Plan: Monitored bed. Consults called: [facility]. Admission status: Observation.

## 2024-02-01 NOTE — ED Notes (Signed)
 Patient and husband have reservations about the blood transfusion and it effecting her status on the transplant list.  Admitting team made aware that patient's husband is requesting contact from them before proceeding with transfusion.

## 2024-02-01 NOTE — Significant Event (Signed)
 I discussed with patient's husband and at this time patient's husband said that patient was approved for liver transplant yesterday by the patient's insurance and had requested to contact transplant team at [facility] at [phone] to give update about patient's condition.  I have contacted them and I am awaiting for reply.    Redia Cleaver MD

## 2024-02-01 NOTE — ED Notes (Signed)
Blood Glucose 332

## 2024-02-01 NOTE — Progress Notes (Addendum)
 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 difficile treated 09/2023  Patient does get weekly large-volume paracentesis every Friday locally in [place]-up to 9 L at a time-she has had recent admissions for rectal bleeding as well as decompensated cirrhosis with hepatic encephalopathy and ascites   Chronology   9/29-10/1 [facility] underwent paracentesis 7 L removed blood cultures were negative GI consulted needed midodrine  for blood pressure support sent home on Xifaxan  Aldactone  Lasix -treated with 7 days of Cipro  for possible UTI  10/4 readmit to [facility] after being referred to them on 01/12/2024 by [facility] by Dr. Onita of GI who agreed MELD 3.0 was 23 and was negative for SBP by fluid studies 01/10/2024-underwent CT coronary scan cardiology evaluation for acute CHF previously dental and surgical eval--was started on iron sulfate 325 daily at that facility--had paracentesis 10/6 no organisms seen received albumin  and was ultimately discharged home lactulose  was reduced to 20 3 times daily  10/21 [facility] ED- paracentesis 8 L fluid drawn off Tmax 101.1 shortness of breath abdominal swelling Diagnostic paracentesis did not show any bacteria WBC    Pertinent imaging/studies till date  CT ABD pelvis cirrhotic change of liver  with underlying portal hypertension ascites and splenomegaly with stable cholelithiasis Portable CXR no active disease   Assessment  & Plan :    Decompensated liver cirrhosis with hypervolemic hyponatremia presumed in the setting of large volume ascites clinically evident at bedside Pancytopenia secondary to the above with anemia MELD NA score varying from 17-25 I spoke with Lanier Hickman transplant coordinator h/navigator [phone] at [facility] --we are currently awaiting a bed at [facility] -- accepting is Morna Carder, NP with gastroenterology She will be in touch with patient/family At this time we will continue ceftriaxone  2 g every 24 and await ultrasound paracentesis for workup of what appears to be SBP Continue at this time midodrine  for blood pressure support 15 3 times daily Continue rifaximin  550 twice daily lactulose  20 twice daily--- ammonia was elevated at 76 but she is coherent eating breakfast and sitting comfortably we may need to escalate the dose of the lactulose  if she gets confused Pain control can use Dilaudid  for severe pain but then would use oxycodone  5 every 6 as needed for moderate pain--- would use pain meds sparingly to avoid confusion about why confusion occurs for her Cholecystitis I will defer workup to GI-patient is tender over the right upper quadrant but also over her left upper quadrant presumably because of splenomegaly/hepatomegaly Antibiotics should cover any acute issue going forward Anemia of chronic disease versus anemia of liver disease Watch WBC watch platelets She initially did not consent to transfusion of PRBC-she is not overtly losing any blood We will transfuse 1 unit of PRBC and follow serial hemoglobins and Diabetes mellitus type 2 We have increased her Lantus  to 12 units  Placed on sliding scale moderate with 3 times daily insulin  3 units Restless leg Hypokalemia Replacing  Data Reviewed today:  WBC 1.7 hemoglobin 6.9  platelets 63 Sodium 129 potassium 3.4 BUN/creatinine 6/0.7 total bili 2.8 direct bili 0.9 indirect 1.9 INR 1.8   DVT prophylaxis: scd   Status is: Observation The patient will require care spanning > 2 midnights and should be moved to inpatient because: She requires aggressive medical management and    Dispo/Global plan: Awaiting bed at [facility]   Time 75 minutes including care coordination time and discussion with [facility]   Subjective:   Awake coherent no distress She is not having any bleeding anywhere No nausea no vomiting    Objective + exam Vitals:   02/01/24 0330 02/01/24 0345 02/01/24 0515 02/01/24 0630  BP: 107/71 110/62 106/67 (!) 123/96  Pulse: 93 92 94 92  Resp: 18 19 16 20   Temp:    99 F (37.2 C)  TempSrc:      SpO2: 97% 94% 95% 97%   There were no vitals filed for this visit.   Examination: EOMI NCAT no focal deficit ?  Holosystolic murmur Abdomen soft distended with shifting dullness as well as right upper quadrant tenderness and left upper quadrant tenderness as well ROM intact    Scheduled Meds:  FLUoxetine   40 mg Oral Daily   insulin  glargine-yfgn  12 Units Subcutaneous Daily   lactulose   20 g Oral BID   midodrine   15 mg Oral TID with meals   pantoprazole   40 mg Oral Daily   rifaximin   550 mg Oral BID   rOPINIRole   1 mg Oral QHS   Continuous Infusions:  cefTRIAXone  (ROCEPHIN )  IV       Jai-Gurmukh Donnella Morford, MD  [facility]

## 2024-02-01 NOTE — Progress Notes (Signed)
 The candidate was typed on two separate occasions prior to listing for a Liver transplant. Using all available source documents, I validated that the blood type, patient name, and date of birth or medical record number on the lab reports match this specific information entered into [facility].   Lab Results  Component Value Date/Time   ABORH O Positive 12/08/2023 11:33 AM   Component 2 mo ago  ABO/RH(D) O POS  Antibody Screen NEG  Sample Expiration 11/21/2023,2359 Performed at [facility], [address], [place], [place] [address]  Resulting Agency [facility] <redacted file path>  Specimen Collected: 11/18/23 11:35   Performed by: [facility] <redacted file path> Last Resulted: 11/18/23 13:17  Received From: [place]  Result Received: 11/21/23 08:49     Social Security Wlfazm:[id number] Social Security number was validated via source documents.    Lanier Sharps, RN 02/01/24 10:12 AM

## 2024-02-01 NOTE — ED Notes (Signed)
 Consent signed by pt. This rn was a witness.

## 2024-02-02 DIAGNOSIS — R188 Other ascites: Secondary | ICD-10-CM

## 2024-02-02 DIAGNOSIS — K7581 Nonalcoholic steatohepatitis (NASH): Secondary | ICD-10-CM | POA: Diagnosis not present

## 2024-02-02 DIAGNOSIS — K7290 Hepatic failure, unspecified without coma: Secondary | ICD-10-CM | POA: Diagnosis not present

## 2024-02-02 DIAGNOSIS — K7682 Hepatic encephalopathy: Secondary | ICD-10-CM

## 2024-02-02 DIAGNOSIS — K7460 Unspecified cirrhosis of liver: Secondary | ICD-10-CM | POA: Diagnosis not present

## 2024-02-02 LAB — BPAM RBC
Blood Product Expiration Date: 202511172359
ISSUE DATE / TIME: 202510221158
Unit Type and Rh: 5100

## 2024-02-02 LAB — COMPREHENSIVE METABOLIC PANEL WITH GFR
ALT: 24 U/L (ref 0–44)
AST: 47 U/L — ABNORMAL HIGH (ref 15–41)
Albumin: 3.3 g/dL — ABNORMAL LOW (ref 3.5–5.0)
Alkaline Phosphatase: 90 U/L (ref 38–126)
Anion gap: 8 (ref 5–15)
BUN: 6 mg/dL (ref 6–20)
CO2: 23 mmol/L (ref 22–32)
Calcium: 8.4 mg/dL — ABNORMAL LOW (ref 8.9–10.3)
Chloride: 100 mmol/L (ref 98–111)
Creatinine, Ser: 0.66 mg/dL (ref 0.44–1.00)
GFR, Estimated: >60 mL/min (ref >60)
Glucose, Bld: 312 mg/dL — ABNORMAL HIGH (ref 70–99)
Potassium: 4.1 mmol/L (ref 3.5–5.1)
Sodium: 131 mmol/L — ABNORMAL LOW (ref 135–145)
Total Bilirubin: 2.5 mg/dL — ABNORMAL HIGH (ref 0.0–1.2)
Total Protein: 6.1 g/dL — ABNORMAL LOW (ref 6.5–8.1)

## 2024-02-02 LAB — TYPE AND SCREEN
ABO/RH(D): O POS
Antibody Screen: NEGATIVE
Unit division: 0

## 2024-02-02 LAB — CBC WITH DIFFERENTIAL/PLATELET
Abs Immature Granulocytes: 0.01 K/uL (ref 0.00–0.07)
Basophils Absolute: 0 K/uL (ref 0.0–0.1)
Basophils Relative: 1 %
Eosinophils Absolute: 0.1 K/uL (ref 0.0–0.5)
Eosinophils Relative: 5 %
HCT: 24.4 % — ABNORMAL LOW (ref 36.0–46.0)
Hemoglobin: 7.8 g/dL — ABNORMAL LOW (ref 12.0–15.0)
Immature Granulocytes: 1 %
Lymphocytes Relative: 23 %
Lymphs Abs: 0.3 K/uL — ABNORMAL LOW (ref 0.7–4.0)
MCH: 28.8 pg (ref 26.0–34.0)
MCHC: 32 g/dL (ref 30.0–36.0)
MCV: 90 fL (ref 80.0–100.0)
Monocytes Absolute: 0.2 K/uL (ref 0.1–1.0)
Monocytes Relative: 13 %
Neutro Abs: 0.9 K/uL — ABNORMAL LOW (ref 1.7–7.7)
Neutrophils Relative %: 57 %
Platelets: 57 K/uL — ABNORMAL LOW (ref 150–400)
RBC: 2.71 MIL/uL — ABNORMAL LOW (ref 3.87–5.11)
RDW: 16.2 % — ABNORMAL HIGH (ref 11.5–15.5)
Smear Review: DECREASED
WBC: 1.5 K/uL — ABNORMAL LOW (ref 4.0–10.5)
nRBC: 0 % (ref 0.0–0.2)

## 2024-02-02 LAB — PATHOLOGIST SMEAR REVIEW

## 2024-02-02 LAB — CBG MONITORING, ED
Glucose-Capillary: 308 mg/dL — ABNORMAL HIGH (ref 70–99)
Glucose-Capillary: 366 mg/dL — ABNORMAL HIGH (ref 70–99)

## 2024-02-02 LAB — GLUCOSE, CAPILLARY
Glucose-Capillary: 239 mg/dL — ABNORMAL HIGH (ref 70–99)
Glucose-Capillary: 282 mg/dL — ABNORMAL HIGH (ref 70–99)
Glucose-Capillary: 265 mg/dL — ABNORMAL HIGH (ref 70–99)

## 2024-02-02 LAB — PROTIME-INR
INR: 1.8 — ABNORMAL HIGH (ref 0.8–1.2)
Prothrombin Time: 21.7 s — ABNORMAL HIGH (ref 11.4–15.2)

## 2024-02-02 MED ORDER — INSULIN ASPART 100 UNIT/ML IJ SOLN
3.0000 [IU] | Freq: Three times a day (TID) | INTRAMUSCULAR | Status: DC
  Administered 2024-02-02 (×2): 3 [IU] via SUBCUTANEOUS

## 2024-02-02 MED ORDER — ALBUMIN HUMAN 25 % IV SOLN
50.0000 g | Freq: Once | INTRAVENOUS | Status: AC
Start: 2024-02-02 — End: 2024-02-02
  Administered 2024-02-02: 12.5 g via INTRAVENOUS
  Filled 2024-02-02: qty 200

## 2024-02-02 MED ORDER — MELATONIN 3 MG PO TABS
3.0000 mg | ORAL_TABLET | Freq: Every evening | ORAL | Status: AC | PRN
  Administered 2024-02-02: 3 mg via ORAL
  Filled 2024-02-02: qty 1

## 2024-02-02 MED ORDER — INSULIN GLARGINE-YFGN 100 UNIT/ML ~~LOC~~ SOLN: 20.0000 [IU] | Freq: Every day | SUBCUTANEOUS | Status: DC

## 2024-02-02 MED ORDER — INSULIN ASPART 100 UNIT/ML IJ SOLN
0.0000 [IU] | Freq: Three times a day (TID) | INTRAMUSCULAR | Status: DC
  Administered 2024-02-02: 11 [IU] via SUBCUTANEOUS
  Administered 2024-02-02 – 2024-02-03 (×2): 7 [IU] via SUBCUTANEOUS
  Administered 2024-02-03 (×2): 11 [IU] via SUBCUTANEOUS

## 2024-02-02 MED ORDER — ALBUMIN HUMAN 25 % IV SOLN
25.0000 g | Freq: Once | INTRAVENOUS | Status: AC
  Administered 2024-02-03: 25 g via INTRAVENOUS
  Filled 2024-02-02: qty 100

## 2024-02-02 MED ORDER — INSULIN GLARGINE-YFGN 100 UNIT/ML ~~LOC~~ SOLN
20.0000 [IU] | Freq: Every day | SUBCUTANEOUS | Status: DC
  Administered 2024-02-02: 20 [IU] via SUBCUTANEOUS
  Filled 2024-02-02 (×3): qty 0.2

## 2024-02-02 MED ORDER — DIPHENHYDRAMINE HCL 25 MG PO CAPS
25.0000 mg | ORAL_CAPSULE | Freq: Once | ORAL | Status: AC | PRN
  Administered 2024-02-03: 25 mg via ORAL
  Filled 2024-02-02: qty 1

## 2024-02-02 NOTE — Plan of Care (Signed)
   Problem: Education: Goal: Knowledge of General Education information will improve Description: Including pain rating scale, medication(s)/side effects and non-pharmacologic comfort measures Outcome: Progressing   Problem: Clinical Measurements: Goal: Respiratory complications will improve Outcome: Progressing   Problem: Activity: Goal: Risk for activity intolerance will decrease Outcome: Progressing

## 2024-02-02 NOTE — Progress Notes (Signed)
 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 difficile treated 09/2023  Patient does get weekly large-volume paracentesis every Friday locally in [place]-up to 9 L at a time-she has had recent admissions for rectal bleeding as well as decompensated cirrhosis with hepatic encephalopathy and ascites   Chronology   9/29-10/1 [facility] underwent paracentesis 7 L removed blood cultures were negative GI consulted needed midodrine  for blood pressure support sent home on Xifaxan  Aldactone  Lasix -treated with 7 days of Cipro  for possible UTI  10/4 readmit to [facility] after being referred to them on 01/12/2024 by [facility] by Dr. Onita of GI who agreed MELD 3.0 was 23 and was negative for SBP by fluid studies 01/10/2024-underwent CT coronary scan cardiology evaluation for acute CHF previously dental and surgical eval--was started on iron sulfate 325 daily at that facility--had paracentesis 10/6 no organisms seen received albumin  and was ultimately discharged home lactulose  was reduced to 20 3 times daily  10/21 [facility]- paracentesis 8 L fluid drawn off Tmax 101.1 shortness of breath abdominal swelling Diagnostic paracentesis did not show any bacteria WBC  I spoke with Lanier Hickman transplant coordinator h/navigator [phone] at [facility] several x's on 02/01/24--we are currently awaiting a bed [facility] -- accepting is Morna Carder, NP with gastroenterology    Pertinent imaging/studies till date  CT ABD pelvis cirrhotic change of liver with underlying portal hypertension ascites and splenomegaly with stable cholelithiasis Portable CXR no active disease 10/22 Paracentesis 8 liters of fluid off of abdomen-culture results pending Blood culture X2 ordered to rule out endocarditis pending 1 unit PRBC transfused   Assessment  & Plan :    Decompensated liver cirrhosis with hypervolemic hyponatremia  large volume ascites  Pancytopenia secondary to the above with anemia MELD NA score varying from 17-25 continue ceftriaxone  2 g every 24-preliminary Gram stain no organism, peritoneal fluid count shows 5% neutrophils-await culture finalization Continue rifaximin  550 twice daily lactulose  20 twice daily--- ammonia was elevated at 76 --coherent Pain control can use Dilaudid  for severe pain but then would use oxycodone  5 every 6 as needed for moderate pain She is mentating well she looks stable for transfer main concern and complaint is lower quadrant pain Hypotension in the setting of altered splanchnic circulation At home is on midodrine  10 3 times daily here we have increased to 15 3 times daily meals She received albumin  after paracentesis 10/22 and received another 50 g today Rule out endocarditis?? Blood cultures obtained at the request of GI although I am not very convinced this is the etiology See below discussion Cholecystitis I will defer workup to GI-patient is tender over the right upper quadrant but also over her left upper quadrant presumably because of splenomegaly/hepatomegaly Antibiotics should cover any acute issue going forward with ceftriaxone -no need for broadened intra-abdominal Anemia of chronic disease versus anemia  of liver disease Worsening pancytopenia She initially did not consent to  transfusion of PRBC-she is not overtly losing any blood Cytopenias slightly worse-watch closely ANC if drops below 500 may need neutropenic precautions and discussion about GSF stimulation Diabetes mellitus type 2 We have increased her Lantus  to 20 units as CBGs are now controlled in the 320-360 range Continue sliding scale but will change to resistant with 3 units 3 times daily coverage Restless leg Hypokalemia Labs pending from this morning  Data Reviewed today:  WBC 1.5 hemoglobin 7.8 ANC 0.9 platelet 57 Sodium 131 potassium 4.1 BUN/creatinine 6/0.6 AST/ALT 47/24 bilirubin 2.5 INR 1.88  DVT prophylaxis: scd   Status is: Observation The patient will require care spanning > 2 midnights and should be moved to inpatient because: She requires aggressive medical management and    Dispo/Global plan: Awaiting bed at [facility]   Time 35   Subjective:   Coherent main concern is little bit of sleepiness No chest pain No nausea no vomiting Tolerating diet fairly no dark stool no tarry stool no vomiting although some nausea Overall improved from prior to admission I have told her she can walk around if she wants    Objective + exam Vitals:   02/02/24 0355 02/02/24 0415 02/02/24 0430 02/02/24 0445  BP: 109/65  102/61   Pulse: 89 79 84 89  Resp: 12 13 16 20   Temp:      TempSrc:      SpO2: 100% 100% 100% 99%  Height:       There were no vitals filed for this visit.   Examination: EOMI NCAT no focal deficit ?  Holosystolic murmur Abdomen distended with striae on the right side as well as hematoma in the center slightly tender in the right lower quadrant Lower extremity edema is present Chest is clear    Scheduled Meds:  FLUoxetine   40 mg Oral Daily   insulin  aspart  0-15 Units Subcutaneous TID WC   insulin  aspart  3 Units Subcutaneous TID WC   insulin  glargine-yfgn  12 Units Subcutaneous Daily   lactulose   20 g Oral BID   midodrine   15 mg Oral TID with meals    pantoprazole   40 mg Oral Daily   rifaximin   550 mg Oral BID   rOPINIRole   1 mg Oral QHS   Continuous Infusions:  albumin  human     cefTRIAXone  (ROCEPHIN )  IV Stopped (02/02/24 0145)   HYDROmorphone  (DILAUDID ) injection, oxyCODONE   Jai-Gurmukh Henley Blyth, MD  [facility]

## 2024-02-02 NOTE — ED Notes (Signed)
 Patient is requesting medication to help with sleep. [facility] were paged.

## 2024-02-02 NOTE — ED Notes (Signed)
 Breakfast tray ordered, refused lactulose  until she eats

## 2024-02-02 NOTE — Progress Notes (Signed)
 New Admission Note:   Arrival Method: stretcher Mental Orientation: aa+ox4 Telemetry: SR Assessment: Completed Skin: c/d/i IV: SL Pain: denies Tubes: n/a Safety Measures: Safety Fall Prevention Plan has been given, discussed and signed Admission: Completed [facility] Orientation: Patient has been orientated to the room, unit and staff.  Family: not present  Orders have been reviewed and implemented. Will continue to monitor the patient. Call light has been placed within reach and bed alarm has been activated.   Elvina Hammers, RN

## 2024-02-02 NOTE — Progress Notes (Addendum)
 Patient ID: Laurie Sutton, female   DOB: 04-09-1981, 43 y.o.   MRN: [id number]    Progress Note   Subjective   Day # 2 CC; decompensated NASH/MASH cirrhosis and acute abdominal pain, fever, cough Patient is established with [facility] and on transplant list there-has been accepted for transfer but no beds available.  Patient is feeling better today, she has been afebrile overnight no significant cough today still complaining of pain/discomfort bilateral flanks/abdomen.  Tolerating solid food no nausea or vomiting.  Mentating well  Status post large-volume paracentesis yesterday-8.5 L Cell counts not consistent with SBP/Gram stain no organisms/culture pending  Labs today pro time 21.7/INR 1.8 WBC 1.5/hemoglobin 7.8/hematocrit 24.4/platelets 57 Sodium 131/potassium 4.1/BUN 6/creatinine 0.66 glucose 312 T. bili 2.5/AST 47/ALT 24/alk phos 90  MELD 3.0: 21 at 02/02/2024  6:42 AM MELD-Na: 21 at 02/02/2024  6:42 AM Calculated from: Serum Creatinine: 0.66 mg/dL (Using min of 1 mg/dL) at 89/76/7974  3:57 AM Serum Sodium: 131 mmol/L at 02/02/2024  6:42 AM Total Bilirubin: 2.5 mg/dL at 89/76/7974  3:57 AM Serum Albumin : 3.3 g/dL at 89/76/7974  3:57 AM INR(ratio): 1.8 at 02/02/2024  6:42 AM Age at listing (hypothetical): 42 years Sex: Female at 02/02/2024  6:42 AM     Objective   Vital signs in last 24 hours: Temp:  [97.9 F (36.6 C)-98.8 F (37.1 C)] 97.9 F (36.6 C) (10/23 1150) Pulse Rate:  [72-89] 76 (10/23 1150) Resp:  [12-20] 18 (10/23 1150) BP: (92-118)/(28-67) 103/45 (10/23 1150) SpO2:  [96 %-100 %] 98 % (10/23 1150) Weight:  [80.7 kg] 80.7 kg (10/23 1150) Last BM Date : 02/01/24 General:    43 year old white female in NAD chronically ill-appearing, mentating well Heart:  Regular rate and rhythm; no murmurs Lungs: Respirations even and unlabored, lungs CTA bilaterally Abdomen:  Soft, large, nontense ascites, mild tenderness bilateral lateral  abdomen/flanks Neurologic:  Alert and oriented,  grossly normal neurologically.  No asterixis.  Speech very slightly slurred Psych:  Cooperative. Normal mood and affect.  Intake/Output from previous day: 10/22 0701 - 10/23 0700 In: 691 [Blood:291; IV Piggyback:400] Out: 8500  Intake/Output this shift: Total I/O In: 485.3 [P.O.:400; IV Piggyback:85.3] Out: 0   Lab Results: Recent Labs    01/31/24 2202 02/01/24 0554 02/01/24 2143 02/02/24 0642  WBC 2.1* 1.7*  --  1.5*  HGB 8.8* 6.9* 8.5* 7.8*  HCT 27.5* 22.0* 25.0* 24.4*  PLT 70* 63*  --  57*   BMET Recent Labs    01/31/24 2202 02/01/24 0554 02/01/24 2143 02/02/24 0642  NA 128* 129* 134* 131*  K 3.3* 3.4* 3.3* 4.1  CL 99 103 98 100  CO2 20* 21*  --  23  GLUCOSE 335* 333* 326* 312*  BUN 7 6 5* 6  CREATININE 0.76 0.75 0.60 0.66  CALCIUM  8.0* 7.8*  --  8.4*   LFT Recent Labs    02/01/24 0554 02/02/24 0642  PROT 5.7* 6.1*  ALBUMIN  2.5* 3.3*  AST 34 47*  ALT 23 24  ALKPHOS 94 90  BILITOT 2.8* 2.5*  BILIDIR 0.9*  --   IBILI 1.9*  --    PT/INR Recent Labs    02/01/24 0554 02/02/24 0642  LABPROT 22.1* 21.7*  INR 1.8* 1.8*    Studies/Results: IR Paracentesis Result Date: 02/01/2024 INDICATION: History of NASH cirrhosis, recurrent ascites. Request for diagnostic and therapeutic paracentesis. EXAM: ULTRASOUND GUIDED DIAGNOSTIC AND THERAPEUTIC PARACENTESIS MEDICATIONS: 8 mL 1% lidocaine  COMPLICATIONS: None immediate. PROCEDURE: Informed written consent was obtained from  the patient after a discussion of the risks, benefits and alternatives to treatment. A timeout was performed prior to the initiation of the procedure. Initial ultrasound scanning demonstrates a large amount of ascites within the left lower abdominal quadrant. The left lower abdomen was prepped and draped in the usual sterile fashion. 1% lidocaine  was used for local anesthesia. Following this, a 19 gauge, 7-cm, Yueh catheter was introduced. An  ultrasound image was saved for documentation purposes. The paracentesis was performed. The catheter was removed and a dressing was applied. The patient tolerated the procedure well without immediate post procedural complication. FINDINGS: A total of approximately 8.5 liters of hazy yellow fluid was removed. Samples were sent to the laboratory as requested by the clinical team. IMPRESSION: Successful ultrasound-guided paracentesis yielding 8.5 liters of peritoneal fluid. Performed by: Wyatt Pommier, PA-C Electronically Signed   By: CHRISTELLA.  Shick M.D.   On: 02/01/2024 12:38   CT ABDOMEN PELVIS W CONTRAST Result Date: 02/01/2024 CLINICAL DATA:  Acute abdominal pain and abdominal distension EXAM: CT ABDOMEN AND PELVIS WITH CONTRAST TECHNIQUE: Multidetector CT imaging of the abdomen and pelvis was performed using the standard protocol following bolus administration of intravenous contrast. RADIATION DOSE REDUCTION: This exam was performed according to the departmental dose-optimization program which includes automated exposure control, adjustment of the mA and/or kV according to patient size and/or use of iterative reconstruction technique. CONTRAST:  75mL OMNIPAQUE  IOHEXOL  350 MG/ML SOLN COMPARISON:  01/06/2024 FINDINGS: Lower chest: No acute abnormality. Hepatobiliary: Liver is nodular and shrunken consistent with underlying cirrhotic change. Considerable ascites is identified. The gallbladder demonstrates dependent gallstones stable in appearance from the prior exam. No biliary ductal dilatation is seen. Pancreas: Unremarkable. No pancreatic ductal dilatation or surrounding inflammatory changes. Spleen: Spleen remains enlarged in size. Dilatation of the splenic vein is noted. Adrenals/Urinary Tract: Adrenal glands are within normal limits. Kidneys demonstrate a normal enhancement pattern bilaterally. No renal calculi or obstructive changes are seen. The bladder is well distended. Stomach/Bowel: No obstructive or  inflammatory changes of the colon are seen. Mild scattered fecal material is seen. The appendix is air filled. Small bowel and stomach appear within normal limits. Small paraesophageal varices are seen. Vascular/Lymphatic: Prominence of the portal vein is noted consistent with the underlying portal hypertension. Mild para esophageal varices are seen. Recanalization of the umbilical vein is noted but diminutive size similar to that seen on prior exam. Reproductive: Uterus has been surgically removed. Ovaries appear within normal limits bilaterally. Other: Significant ascites is noted consistent with the underlying portal hypertension. This is increased when compared prior exam despite interval paracentesis. Musculoskeletal: No acute or significant osseous findings. IMPRESSION: Cirrhotic change of the liver with underlying portal hypertension and ascites. Splenomegaly is noted as well. Cholelithiasis stable from the prior study. No acute abnormality noted. Electronically Signed   By: Oneil Devonshire M.D.   On: 02/01/2024 00:13   DG Chest Port 1 View Result Date: 01/31/2024 CLINICAL DATA:  Cough and fever EXAM: PORTABLE CHEST 1 VIEW COMPARISON:  01/13/2024 FINDINGS: The heart size and mediastinal contours are within normal limits. Both lungs are clear. The visualized skeletal structures are unremarkable. IMPRESSION: No active disease. Electronically Signed   By: Luke Bun M.D.   On: 01/31/2024 21:41       Assessment / Plan:    #17 43 year old white female with decompensated [name] has been requiring weekly large-volume paracentesis, and has had recurrent issues with hepatic encephalopathy. Patient presented to the ER with complaint of fever and abdominal  pain on the evening of 01/31/2024-pain diffuse and more swollen than usual for her.  Reported fever to 102 at home.  Workup here negative thus far as to etiology of fever-afebrile over the past 24 hours Blood cultures negative thus far Chest  x-ray negative Large-volume paracentesis with no evidence for SBP UA negative Respiratory panel negative Cholelithiasis on CT but no CT evidence for cholecystitis  Covering with ceftriaxone   Patient feeling better today, she has some reaccumulation of fluid already though not tense  Overall her hepatic parameters are stable with no evidence of acute decompensation with this admission.  [facility] was contacted yesterday, and she has been accepted for transfer there though no beds available. As her parameters are stable, and clinically symptomatically improved she may not require transfer during this admission though as she is on the transplant list may need to be notified with each hospital admission   #2 refractory ascites- #3 pancytopenia secondary to above #4 chronic hypotension-on midodrine  #5 cholelithiasis-no evidence for cholecystitis by CT #6  diabetes mellitus currently poorly controlled   Plan; carb modified 2 g sodium diet Repeat labs in a.m. Will plan for smaller volume paracentesis tomorrow/4 L with dose of IV albumin  She will need to get lined up for repeat paracentesis next Friday as she has been having weekly paracentesis via IR.  Unless she has recurrent fevers/decline in status over the next 24 hours do not think she requires transfer to [facility] during this admission.  GI will continue to follow with you      Principal Problem:   Decompensated cirrhosis (HCC) Active Problems:   Anxiety and depression   GERD (gastroesophageal reflux disease)   DM (diabetes mellitus) (HCC)   Liver cirrhosis secondary to NASH (HCC)   Pancytopenia (HCC)   Chronic hypotension     LOS: 1 day   Amy Esterwood PA-C 02/02/2024, 3:29 PM   I have taken an interval history, thoroughly reviewed the chart and examined the patient. I agree with the Advanced Practitioner's note, impression and recommendations, and have recorded additional findings, impressions and  recommendations below. I performed a substantive portion of this encounter (>50% time spent), including a complete performance of the medical decision making.  My additional thoughts are as follows:  Laurie Sutton looks better today.  She is more alert, and conversational (slept better after getting out of the ED).  She is breathing comfortably on room air and says her cough has significantly diminished.  No documented fever since admission.  No signs of systemic illness, and other workup negative as recorded in yesterday's note.  Blood cultures were drawn last evening, so clearly no results from that yet.  Abdomen protuberant with ascites but soft and nontender.  Hemoglobin stabilized after transfusion.  Overall she is clinically improved since admission with diminishing respiratory symptoms and no documented fever.  Parameters of her liver disease and MELD score remain stable and near baseline.  No active GI bleeding, mental status improving with medical treatments and some better rest.  Taking oral nutrition and breathing comfortably on room air.  Overall, I think she no longer needs transfer to the [facility] service in [place].  We have just learned that a bed is available, and I have communicated with the Hepatology service there to bring them up-to-date on Laurie Sutton's condition.  When we saw Laurie Sutton earlier this afternoon and told her a transfer no longer seem necessary unless there was a liver available for her (which there is not), then we will keep  her here and continue current treatments. She was relieved to hear that and would rather be closer to home if possible.  She could use an additional 4 liter therapeutic paracentesis tomorrow with 25 grams of IV albumin .  I think this will give her some additional relief of the abdominal distention and dyspnea that she gets with large volume fluid retention.  Then she can get back on her usual weekly outpatient schedule of an 8 L paracentesis the  following Friday (a week later).  I also spoke with the nurse practitioner at the [facility] clinic in [place] to give them a clinical update and confirm that she also has a follow-up appointment with that clinic a week from tomorrow.  So Laurie Sutton will have close follow-up with the appropriate providers if she is ready for discharge in the next couple of days.  Greatly appreciate all the efforts of the internal medicine service. _________________  This consultation required a high degree of medical decision making due to the nature and complexity of the acute condition(s) being evaluated as well as the patient's medical comorbidities.   40 minutes were spent on this encounter, including in depth chart review, independent review of results as outlined above, communicating results with the patient directly, face-to-face time with the patient, coordinating care, ordering studies and medications as appropriate, and documentation.    Victory LITTIE Brand III Office:[phone]    MELD 3.0: 21 at 02/02/2024  6:42 AM MELD-Na: 21 at 02/02/2024  6:42 AM Calculated from: Serum Creatinine: 0.66 mg/dL (Using min of 1 mg/dL) at 89/76/7974  3:57 AM Serum Sodium: 131 mmol/L at 02/02/2024  6:42 AM Total Bilirubin: 2.5 mg/dL at 89/76/7974  3:57 AM Serum Albumin : 3.3 g/dL at 89/76/7974  3:57 AM INR(ratio): 1.8 at 02/02/2024  6:42 AM Age at listing (hypothetical): 42 years Sex: Female at 02/02/2024  6:42 AM \

## 2024-02-02 NOTE — Progress Notes (Signed)
-------------------------------------------------------------------------------   Attestation signed by Elspeth LITTIE Reek, MD at 02/02/2024  5:55 PM HEPATOLOGY ATTENDING ATTESTATION: I reviewed the patient with Arthea Chant MD the GI fellow, and concur with the fellow's note.  Elspeth Reek MD MPH [facility] -------------------------------------------------------------------------------   Hepatology PCL Update   [name] is a 43 y.o. female with history of liver cirrhosis secondary to MASH with history of portal hypertensive gastropathy, esophageal varices, scites who presented to OSH for fever, and weight gain, shortness of breath.  Contacted by Dr Victory Moats (GI) at [facility] about workup and intervention so far:    MELD 21 which is in range with prior scores.   Hgb 6.9 on admit but there  were no reports of GI bleeding.  She had a CT AP w/ contrast that did not show acute findings  Rvp negative and cxr clear.  On room air. No fever since admit.  She underwent a para -8L, negative for SBP. Has been getting weekly para's for last couple of months so this is not out of the ordinary for her.  Her mentation is stable on lactulose  and rifaximin   -----------  In summary she came for fever and cough with a negative infectious workup thus far and symptomatic improvement. Her volume overload is usual for her was she has refractory ascites and gets nearly weekly para's, and is reportedly doing better in this regard too after an 8L paracentesis.   PLAN  -Given report of improvement in patient's status, no further fevering, etc., reasonable to hold off on transfer to [facility] as patient's trajectory is estimated to be well enough for discharge this upcoming weekend. Discussed for [facility] to call us  back if any significant changes or need for reconsideration. I discussed this with the hepatology attending Dr Reek who was in  agreement  Patient follows with Stephane Quest, NP for hepatology outpatient, will need close outpatient follow up on discharge. Will fwd note so they are up-to-date  Arthea Chant, MD PGY-4 Gastroenterology Fellow [facility]

## 2024-02-02 NOTE — Plan of Care (Signed)

## 2024-02-03 ENCOUNTER — Other Ambulatory Visit (HOSPITAL_COMMUNITY)

## 2024-02-03 ENCOUNTER — Inpatient Hospital Stay (HOSPITAL_COMMUNITY)

## 2024-02-03 DIAGNOSIS — K7460 Unspecified cirrhosis of liver: Secondary | ICD-10-CM | POA: Diagnosis not present

## 2024-02-03 DIAGNOSIS — K219 Gastro-esophageal reflux disease without esophagitis: Secondary | ICD-10-CM

## 2024-02-03 DIAGNOSIS — R188 Other ascites: Secondary | ICD-10-CM | POA: Diagnosis not present

## 2024-02-03 DIAGNOSIS — K7581 Nonalcoholic steatohepatitis (NASH): Secondary | ICD-10-CM | POA: Diagnosis not present

## 2024-02-03 DIAGNOSIS — K7290 Hepatic failure, unspecified without coma: Secondary | ICD-10-CM | POA: Diagnosis not present

## 2024-02-03 HISTORY — PX: IR PARACENTESIS: IMG2679

## 2024-02-03 LAB — CBC WITH DIFFERENTIAL/PLATELET
Abs Immature Granulocytes: 0 K/uL (ref 0.00–0.07)
Basophils Absolute: 0 K/uL (ref 0.0–0.1)
Basophils Relative: 1 %
Eosinophils Absolute: 0.1 K/uL (ref 0.0–0.5)
Eosinophils Relative: 6 %
HCT: 24.2 % — ABNORMAL LOW (ref 36.0–46.0)
Hemoglobin: 7.7 g/dL — ABNORMAL LOW (ref 12.0–15.0)
Immature Granulocytes: 0 %
Lymphocytes Relative: 22 %
Lymphs Abs: 0.4 K/uL — ABNORMAL LOW (ref 0.7–4.0)
MCH: 28.7 pg (ref 26.0–34.0)
MCHC: 31.8 g/dL (ref 30.0–36.0)
MCV: 90.3 fL (ref 80.0–100.0)
Monocytes Absolute: 0.2 K/uL (ref 0.1–1.0)
Monocytes Relative: 14 %
Neutro Abs: 1 K/uL — ABNORMAL LOW (ref 1.7–7.7)
Neutrophils Relative %: 57 %
Platelets: 60 K/uL — ABNORMAL LOW (ref 150–400)
RBC: 2.68 MIL/uL — ABNORMAL LOW (ref 3.87–5.11)
RDW: 16 % — ABNORMAL HIGH (ref 11.5–15.5)
WBC: 1.7 K/uL — ABNORMAL LOW (ref 4.0–10.5)
nRBC: 0 % (ref 0.0–0.2)

## 2024-02-03 LAB — COMPREHENSIVE METABOLIC PANEL WITH GFR
ALT: 21 U/L (ref 0–44)
AST: 35 U/L (ref 15–41)
Albumin: 3.1 g/dL — ABNORMAL LOW (ref 3.5–5.0)
Alkaline Phosphatase: 68 U/L (ref 38–126)
Anion gap: 9 (ref 5–15)
BUN: 6 mg/dL (ref 6–20)
CO2: 20 mmol/L — ABNORMAL LOW (ref 22–32)
Calcium: 8.3 mg/dL — ABNORMAL LOW (ref 8.9–10.3)
Chloride: 101 mmol/L (ref 98–111)
Creatinine, Ser: 0.65 mg/dL (ref 0.44–1.00)
GFR, Estimated: >60 mL/min (ref >60)
Glucose, Bld: 333 mg/dL — ABNORMAL HIGH (ref 70–99)
Potassium: 3.5 mmol/L (ref 3.5–5.1)
Sodium: 130 mmol/L — ABNORMAL LOW (ref 135–145)
Total Bilirubin: 2.8 mg/dL — ABNORMAL HIGH (ref 0.0–1.2)
Total Protein: 5.9 g/dL — ABNORMAL LOW (ref 6.5–8.1)

## 2024-02-03 LAB — GLUCOSE, CAPILLARY
Glucose-Capillary: 320 mg/dL — ABNORMAL HIGH (ref 70–99)
Glucose-Capillary: 355 mg/dL — ABNORMAL HIGH (ref 70–99)
Glucose-Capillary: 332 mg/dL — ABNORMAL HIGH (ref 70–99)
Glucose-Capillary: 295 mg/dL — ABNORMAL HIGH (ref 70–99)
Glucose-Capillary: 266 mg/dL — ABNORMAL HIGH (ref 70–99)
Glucose-Capillary: 233 mg/dL — ABNORMAL HIGH (ref 70–99)

## 2024-02-03 LAB — BODY FLUID CELL COUNT WITH DIFFERENTIAL
Eos, Fluid: 0 %
Lymphs, Fluid: 70 %
Monocyte-Macrophage-Serous Fluid: 25 % — ABNORMAL LOW (ref 50–90)
Neutrophil Count, Fluid: 5 % (ref 0–25)
Total Nucleated Cell Count, Fluid: 144 uL (ref 0–1000)

## 2024-02-03 LAB — PROTIME-INR
INR: 2 — ABNORMAL HIGH (ref 0.8–1.2)
Prothrombin Time: 23.5 s — ABNORMAL HIGH (ref 11.4–15.2)

## 2024-02-03 MED ORDER — LIDOCAINE-EPINEPHRINE 1 %-1:100000 IJ SOLN
20.0000 mL | Freq: Once | INTRAMUSCULAR | Status: DC
  Filled 2024-02-03: qty 20

## 2024-02-03 MED ORDER — INSULIN ASPART 100 UNIT/ML IJ SOLN: 0.0000 [IU] | Freq: Three times a day (TID) | INTRAMUSCULAR | Status: DC

## 2024-02-03 MED ORDER — SPIRONOLACTONE 25 MG PO TABS: 25.0000 mg | ORAL_TABLET | Freq: Every day | ORAL | 11 refills | Status: DC

## 2024-02-03 MED ORDER — INSULIN GLARGINE-YFGN 100 UNIT/ML ~~LOC~~ SOLN
28.0000 [IU] | Freq: Every day | SUBCUTANEOUS | Status: DC
Start: 2024-02-03 — End: 2024-02-03
  Administered 2024-02-03: 28 [IU] via SUBCUTANEOUS
  Filled 2024-02-03: qty 0.28

## 2024-02-03 MED ORDER — INSULIN ASPART 100 UNIT/ML IJ SOLN
6.0000 [IU] | Freq: Three times a day (TID) | INTRAMUSCULAR | Status: DC
  Administered 2024-02-03 (×3): 6 [IU] via SUBCUTANEOUS

## 2024-02-03 MED ORDER — MIDODRINE HCL 5 MG PO TABS: 15.0000 mg | ORAL_TABLET | Freq: Three times a day (TID) | ORAL | Status: DC

## 2024-02-03 MED ORDER — OXYCODONE HCL 5 MG PO TABS: 5.0000 mg | ORAL_TABLET | Freq: Four times a day (QID) | ORAL | Status: DC | PRN

## 2024-02-03 MED ORDER — ONDANSETRON HCL 4 MG/2ML IJ SOLN
4.0000 mg | Freq: Once | INTRAMUSCULAR | Status: AC
  Administered 2024-02-03: 4 mg via INTRAVENOUS
  Filled 2024-02-03: qty 2

## 2024-02-03 MED ORDER — LIDOCAINE-EPINEPHRINE 1 %-1:100000 IJ SOLN
INTRAMUSCULAR | Status: AC
  Filled 2024-02-03: qty 1

## 2024-02-03 NOTE — Progress Notes (Signed)
 Alan RAMAN Dietzman to be discharged Home per MD order. Discussed prescriptions and follow up appointments  and  medication list explained in detail. Patient verbalized understanding.  Skin clean, dry and intact without evidence of skin break down, no evidence of skin tears noted. IV catheter discontinued intact. Site without signs and symptoms of complications. Dressing and pressure applied. Pt denies pain at the site currently. No complaints noted.  Patient free of lines, drains, and wounds.   An After Visit Summary (AVS) was printed and given to the patient. Patient escorted via wheelchair, and discharged home via private auto.  Franciso Bodily, RN

## 2024-02-03 NOTE — TOC CM/SW Note (Signed)
 Transition of Care [facility]) - Inpatient Brief Assessment   Patient Details  Name: Laurie Sutton MRN: [id number] Date of Birth: 1981/03/23  [facility] CM/SW Contact:    Tom-Johnson, Harvest Muskrat, RN Phone Number: 02/03/2024, 11:23 AM   Clinical Narrative:  Patient presented to the ED with Abdominal pain, Nausea and Fever. Admitted with Decompensated Cirrhosis. Patient has hx of Portal Hypertensive Gastropathy, Esophageal Varices, Liver Cirrhosis 2/2 MASH, Ascites, Hepatic Encephalopathy. Patient has been having Paracentesis every week on Fridays. GI following, patient is scheduled for Paracentesis today 02/03/24. Transitioned from IV to Oral abx.  CM spoke with patient at bedside about needs for post hospital transition. Patient states she lives with her husband, father and two children. Employed, independent with care and drive self. Has access to a cane, walker and shower seat.   PCP is [name], MD and uses [facility] on [address]   No ICM needs or recommendations noted at this time.  Patient not Medically ready for discharge.  CM will continue to follow as patient progresses with care towards discharge            Transition of Care Asessment: Insurance and Status: Insurance coverage has been reviewed Patient has primary care physician: Yes Home environment has been reviewed: Yes Prior level of function:: Independent Prior/Current Home Services: No current home services   Readmission risk has been reviewed: Yes Transition of care needs: no transition of care needs at this time

## 2024-02-03 NOTE — Progress Notes (Addendum)
  Gastroenterology Progress Note  CC: MASH cirrhosis, ascites  Subjective: She feels much better today.  No nausea or vomiting.  No abdominal pain.  She denies feeling foggy headed or confused.  She ate baked chicken nuggets and potatoes for lunch.  She passed a soft brown stool earlier this afternoon.  No bloody or black stools.  No chest pain or shortness of breath.  [birth year] and she wishes to go home.   Objective:  Vital signs in last 24 hours: Temp:  [97.8 F (36.6 C)-99 F (37.2 C)] 98.6 F (37 C) (10/24 0823) Pulse Rate:  [75-87] 87 (10/24 0823) Resp:  [17-18] 18 (10/24 0823) BP: (91-112)/(42-71) 104/57 (10/24 1300) SpO2:  [91 %-100 %] 97 % (10/24 0823) Last BM Date : 02/02/24 General: 43 year old female in no acute distress. Eyes: No scleral icterus.  Moderate swelling and erythema to the right upper eyelid, blepharitis versus developing chalazion.  Heart: Regular rate and rhythm. II/VI systolic murmur. Pulm: Breath sounds clear throughout.  On room air. Abdomen: Abdomen is soft with gaseous distention and ascites.  Abdomen is not tense.  LLQ paracentesis site dressing dry and intact.  Positive bowel sounds all 4 quadrants.  No palpable hepatosplenomegaly. Extremities: No lower extremity edema. Neurologic:  Alert and oriented x 4.  Speech is clear.  Moves all extremities equally.  No asterixis. Psych:  Alert and cooperative. Normal mood and affect.  Intake/Output from previous day: 10/23 0701 - 10/24 0700 In: 1225.3 [P.O.:1040; IV Piggyback:185.3] Out: 0  Intake/Output this shift: No intake/output data recorded.  Lab Results: Recent Labs    02/01/24 0554 02/01/24 2143 02/02/24 0642 02/03/24 0410  WBC 1.7*  --  1.5* 1.7*  HGB 6.9* 8.5* 7.8* 7.7*  HCT 22.0* 25.0* 24.4* 24.2*  PLT 63*  --  57* 60*   BMET Recent Labs    02/01/24 0554 02/01/24 2143 02/02/24 0642 02/03/24 0410  NA 129* 134* 131* 130*  K 3.4* 3.3* 4.1 3.5  CL 103 98  100 101  CO2 21*  --  23 20*  GLUCOSE 333* 326* 312* 333*  BUN 6 5* 6 6  CREATININE 0.75 0.60 0.66 0.65  CALCIUM  7.8*  --  8.4* 8.3*   LFT Recent Labs    02/01/24 0554 02/02/24 0642 02/03/24 0410  PROT 5.7*   < > 5.9*  ALBUMIN  2.5*   < > 3.1*  AST 34   < > 35  ALT 23   < > 21  ALKPHOS 94   < > 68  BILITOT 2.8*   < > 2.8*  BILIDIR 0.9*  --   --   IBILI 1.9*  --   --    < > = values in this interval not displayed.   PT/INR Recent Labs    02/02/24 0642 02/03/24 0410  LABPROT 21.7* 23.5*  INR 1.8* 2.0*   Hepatitis Panel No results for input(s): HEPBSAG, HCVAB, HEPAIGM, HEPBIGM in the last 72 hours.  MELD 3.0: 23 at 02/03/2024  4:10 AM MELD-Na: 23 at 02/03/2024  4:10 AM Calculated from: Serum Creatinine: 0.65 mg/dL (Using min of 1 mg/dL) at 89/75/7974  5:89 AM Serum Sodium: 130 mmol/L at 02/03/2024  4:10 AM Total Bilirubin: 2.8 mg/dL at 89/75/7974  5:89 AM Serum Albumin : 3.1 g/dL at 89/75/7974  5:89 AM INR(ratio): 2 at 02/03/2024  4:10 AM Age at listing (hypothetical): 42 years Sex: Female at 02/03/2024  4:10 AM   IR Paracentesis Result Date: 02/03/2024 INDICATION:  History of NASH cirrhosis, recurrent ascites. Request for diagnostic and therapeutic paracentesis. Most recent paracentesis 02/01/24 with 8.5 liters output. She is currently inpatient; she follows with Stephane Quest, NP for hepatology outpatient. EXAM: ULTRASOUND GUIDED DIAGNOSTIC AND THERAPEUTIC PARACENTESIS MEDICATIONS: 10 mL 1% lidocaine  with epi COMPLICATIONS: None immediate. PROCEDURE: Informed written consent was obtained from the patient after a discussion of the risks, benefits and alternatives to treatment. A timeout was performed prior to the initiation of the procedure. Initial ultrasound scanning demonstrates a moderate to large amount of ascites within the left lower abdominal quadrant. The left lower abdomen was prepped and draped in the usual sterile fashion. 1% lidocaine  was used for local  anesthesia. Following this, a 5 Fr, One Step, 7-cm catheter was introduced. An ultrasound image was saved for documentation purposes. The paracentesis was performed. The catheter was removed and a dressing was applied. The patient tolerated the procedure well without immediate post procedural complication. Patient received post-procedure intravenous albumin ; see nursing notes for details. FINDINGS: A total of approximately 4 of clear, yellow fluid was removed. Samples were sent to the laboratory as requested by the clinical team. IMPRESSION: Successful ultrasound-guided paracentesis yielding 4 liters of peritoneal fluid. Performed by Laymon Coast, NP under the supervision of Dr. Vanice. Electronically Signed   By: CHRISTELLA.  Shick M.D.   On: 02/03/2024 12:53    Patient Profile: Laurie Sutton is a 43 year old female with a past medical history of anxiety, depression, restless leg syndrome, C. difficile treated with Dificid  09/2023 and decompensated NASH cirrhosis with large volume refractory ascites requiring weekly paracenteses and hepatic encephalopathy followed at [facility]. Admitted 01/31/2024 with generalized abdominal pain, fever and cough.  Assessment / Plan:  NASH cirrhosis with large volume refractory ascites requiring weekly paracenteses and hepatic encephalopathy. Followed by [facility]. CTAP with contrast 10/21 showed a cirrhotic liver with portal hypertension, ascites, splenomegaly and stable cholelithiasis. There was consideration to transfer patient to [facility] in [place], however, transfer request was canceled as her clinical status improved. MELD 3.0: 23 per labs today.  Infectious workup was unrevealing. S/P paracentesis 10/22, 8.5 L peritoneal fluid removed. S/P paracentesis today, 4L of peritoneal fluid removed, received albumin  25 g IV x 1. No SBP.  Ceftriaxone  discontinued.  Diuretics previously discontinued per hospitalization  10//2025 at [facility]. No clinical signs of HE at this time. - Healthy, 2 g low-sodium diet - Continue Lactulose  20 g twice daily, titrate to no more than 3-4 loose stools daily - Continue Rifaximin  550 mg twice daily - CBC, BMP in am - Continue follow-up with Stephane Quest NP at [facility], appointment scheduled at 12 PM on 02/10/2024 - Await further recommendations per Dr. Legrand  Acute on chronic anemia without overt GI bleeding. - Transfuse for hemoglobin level < 7  Pancytopenia secondary to cirrhosis.  WBC 1.7.  RBC 2.68.  Hemoglobin 7.7.  Platelets 60.  Coagulopathy secondary to cirrhosis. INR 1.8 -> today 2.0.  - INR in am  Hyponatremia, secondary to cirrhosis.  Normal renal function.  Cholelithiasis, no evidence for cholecystitis per CT  GERD - Continue Pantoprazole  40 mg daily  Diabetes mellitus, on insulin .  Glucose poorly controlled. Glu 312 -> 333.  Chronic hypotension on Midodrine   Right eyelid erythema and inflammation, blepharitis versus developing chalazion - Management per the hospitalist   Principal Problem:   Decompensated cirrhosis (HCC) Active Problems:   Anxiety and depression   GERD (gastroesophageal reflux  disease)   DM (diabetes mellitus) (HCC)   Liver cirrhosis secondary to NASH (HCC)   Pancytopenia (HCC)   Hepatic encephalopathy (HCC)   Chronic hypotension     LOS: 2 days   Elida CHRISTELLA Shawl  02/03/2024,  4:36 PM  I have taken an interval history, thoroughly reviewed the chart and examined the patient. I agree with the Advanced Practitioner's note, impression and recommendations, and have recorded additional findings, impressions and recommendations below. I performed a substantive portion of this encounter (>50% time spent), including a complete performance of the medical decision making.  My additional thoughts are as follows:  Clinically stable (albeit with continued hyperglycemia). Underwent 4 L paracentesis  uneventfully today, she is feeling well with no fever or cough and is anxious to go home today.  I have confirmed with Markee that she has an appointment at the [facility] hepatology clinic here in [place] next Friday, and she was aware.  She also knows she is due for therapeutic paracentesis next Friday.  She typically calls the radiology department to schedule that, and she will do so on Monday when she checks on the time of the hepatology visit.  No other changes to her home medications, and Dr. Royal will be discharging her.  Thanks much the internal medicine service for all the help. _________________  This consultation required a low degree of medical decision making due to the nature and complexity of the acute condition(s) being evaluated as well as the patient's medical comorbidities.  Victory LITTIE Brand III [phone]

## 2024-02-03 NOTE — Procedures (Signed)
 PROCEDURE SUMMARY:  Successful image-guided paracentesis from the left lower abdomen.  Yielded 4 liters of clear, yellow fluid.  No immediate complications.  EBL: zero Patient tolerated well.   Specimen sent for labs.  Please see imaging section of Epic for full dictation.  Kriti Katayama NP 02/03/2024 12:31 PM

## 2024-02-03 NOTE — Discharge Summary (Addendum)
 Physician Discharge Summary  PIER LAUX FMW:[id number] DOB: 12/30/1980 DOA: 01/31/2024  PCP: [name], MD  Admit date: 01/31/2024 Discharge date: 02/03/2024  Time spent: 27 minutes  Recommendations for Outpatient Follow-up:  Chem-7 CBC INR 1 week Needs to keep regularly scheduled IR visits for large-volume paracentesis in the outpatient setting-CC on-call to ensure they are aware she is leaving today Recommending Aldactone  25 if she gains more than 2 to 3 L in a 24-hour period of time Close follow-up with  Stephane Quest NP at [facility], appointment scheduled at 12 PM on 02/10/2024-MELD score at discharge was 23  Discharge Diagnoses:  MAIN problem for hospitalization   Decompensated MASH cirrhosis large volume refractory ascites  Please see below for itemized issues addressed in [facility]- refer to other progress notes for clarity if needed  Discharge Condition: Improved  Diet recommendation: Low-salt heart healthy  Filed Weights   02/02/24 1150  Weight: 80.7 kg    History of present illness:  43 year old white female 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 difficile treated 09/2023   Patient does get weekly large-volume paracentesis every Friday locally in [place]-up to 9 L at a time-she has had recent admissions for rectal bleeding as well as decompensated cirrhosis with hepatic encephalopathy and ascites   Chronology    9/29-10/1 [facility] admission underwent paracentesis 7 L removed blood cultures were  negative GI consulted needed midodrine  for blood pressure support sent home on Xifaxan  Aldactone  Lasix -treated with 7 days of Cipro  for possible UTI   10/4 readmit to [facility] after being referred to them on 01/12/2024 by [facility] by Dr. Onita of GI who agreed MELD 3.0 was 23 and was negative for SBP by fluid studies 01/10/2024-underwent CT coronary scan cardiology evaluation for acute CHF previously dental and surgical eval--was started on iron sulfate 325 daily at that facility--had paracentesis 10/6 no organisms seen received albumin  and was ultimately discharged home lactulose  was reduced to 20 3 times daily   10/21 presented to [facility], ED- paracentesis 8 L fluid drawn off Tmax 101.1 shortness of breath abdominal swelling Diagnostic paracentesis did not show any bacteria WBC   I spoke with Lanier Sharps transplant coordinator h/navigator [phone] at [facility] several x's on 02/01/24    Pertinent imaging/studies till date  CT ABD pelvis cirrhotic change of liver with underlying portal hypertension ascites and splenomegaly with stable cholelithiasis Portable CXR no active disease 10/22 Paracentesis 8 liters of fluid off of abdomen-culture results pending Blood culture X2 ordered to rule out endocarditis pending 1 unit PRBC transfused 1/24 paracentesis 4 L and albumin  25 given  Assessment  & Plan :      Decompensated liver cirrhosis with hypervolemic hyponatremia  large volume ascites  Pancytopenia secondary to the above with anemia MELD NA score varying from 17-25 Was given ceftriaxone  2 g every 24-preliminary Gram stain no organism, peritoneal fluid count shows 5% neutrophils all cultures overall were negative, we promptly discontinued antibiotics with negative culture data (blood culture X210/22 is negative paracentesis culture 10/22 also negative) Had another paracentesis on 10/24 Continue rifaximin  550 twice daily lactulose  20 twice daily---  ammonia was elevated at 76  --coherent and alert and no distress Can use low-dose use oxycodone  5 every 6 as needed for moderate pain-prescription given She is mentating well she looks stable for discharge today home here in [place]-we contemplated initially sending her to [facility] and that was canceled by her local gastroenterologist as it was not felt to be high yield as patient does not have a liver waiting for her there just yet Hypotension in the setting of altered splanchnic circulation At home is on midodrine  10 3 times daily here we have increased to 15 3 times daily meals She received albumin  after paracentesis 10/22 and received another 50 g today She also received another paracentesis 10/24 Her hypotension will unfortunately be somewhat chronic Rule out endocarditis?? Blood cultures obtained at the request of GI although I am not very convinced this is the etiology See below discussion Cholecystitis Her abdominal pain is completely resolved with paracentesis X2--I am doubtful that this was cholecystitis related  Antibiotics were given during hospital stay and discontinued at discharge- Anemia of chronic disease versus anemia of liver disease Worsening pancytopenia She initially did not consent to transfusion of PRBC-she is not overtly losing any blood Cytopenias slightly worse-ANC above 1 close follow-up in the outpatient Diabetes mellitus type 2 Sugar was somewhat uncontrolled we placed her back on her continuous glucometer at discharge she can continue her usual home meds Restless leg Hypokalemia Labs p improved overall  Discharge Exam: Vitals:   02/03/24 1300 02/03/24 1611  BP: (!) 104/57 (!) 92/51  Pulse:  80  Resp:  18  Temp:  98.8 F (37.1 C)  SpO2:  98%    Subj on day of d/c   Looks great having a hamburger no distress eating drinking wants to go home No dark stools no tarry stools no confusion no weakness no blurred vision no double vision  General Exam on discharge  EOMI  NCAT thick neck Mallampati 2 Bitemporal wasting with abdominal girth decreased down from prior she does still have a little shifting dullness Trace lower extremity edema Power 5/5 No asterixis   Discharge Instructions   Discharge Instructions     Diet - low sodium heart healthy   Complete by: As directed    Discharge instructions   Complete by: As directed    You had a lot of volume overload this hospital stay and had 2 paracentesis taking off a total of maybe 12 L It is quite unclear why you presented the way you did with fevers but if you regain more than 2 to 3 kg in a 24-hour period of time I would encourage you to take 1 tablet of Aldactone  25 mg Noticed that you needed higher doses of insulin  during the hospital stay and I have put you back on your usual home dose as you will be using your home glucometer Please use lactulose  for 2-3 stools a day and make sure that you take the medications appropriately I have prescribed a very low-dose of pain medication for you going forward for abdominal pain but I think that the pain was coming from overall the swelling in your belly Best of luck have a happy birthday on Sunday take care   Increase activity slowly   Complete by: As directed       Allergies as of 02/03/2024       Reactions   Vancomycin  Itching   Amoxicillin Hives, Itching   Azithromycin     DOES NOT WORK  Dulaglutide  Other (See Comments)   constipation and stomach issues  **Trulicity **   Empagliflozin -metformin  Hcl Er Other (See Comments), Swelling   Januvia  [sitagliptin ] Other (See Comments)   HURTS STOAMCH   Latex    IRRITATES VAGINAL AREA   Metformin     Other Reaction(s): achy all over   Morphine  Itching   Severe    Penicillins Hives   Has patient had a PCN reaction causing immediate rash, facial/tongue/throat swelling, SOB or lightheadedness with hypotension: yes. Rash  Has patient had a PCN reaction causing severe rash involving mucus membranes or skin  necrosis: Yes- rash and hives all over body  Has patient had a PCN reaction that required hospitalization No Has patient had a PCN reaction occurring within the last 10 years: Yes  If all of the above answers are NO, then may proceed with Cephalosporin use.   Vitamin K  And Related Other (See Comments)   Electric shock         Medication List     STOP taking these medications    Blood Glucose Monitoring Suppl Devi   BLOOD GLUCOSE TEST STRIPS Strp   ferrous sulfate 325 (65 FE) MG EC tablet   Gvoke HypoPen 2-Pack 1 MG/0.2ML Soaj Generic drug: Glucagon   hydrocortisone  2.5 % cream   Lancet Device Misc   Lancets Misc   lidocaine  5 % Commonly known as: LIDODERM    Pen Needles 31G X 5 MM Misc   Vitamin D  (Ergocalciferol ) 1.25 MG (50000 UNIT) Caps capsule Commonly known as: DRISDOL    zinc sulfate (50mg  elemental zinc) 220 (50 Zn) MG capsule       TAKE these medications    Constulose  10 GM/15ML solution Generic drug: lactulose  Take 30 mLs (20 g total) by mouth 4 (four) times daily. What changed:  when to take this additional instructions   Dexcom G7 Sensor Misc Use 1 sensor for continuous glucose monitoring every 10 days for 30 days   FLUoxetine  40 MG capsule Commonly known as: PROZAC  Take 1 capsule (40 mg total) by mouth 2 (two) times daily.   insulin  aspart 100 UNIT/ML FlexPen Commonly known as: NOVOLOG  Inject 0-6 Units into the skin 3 (three) times daily with meals. Check Blood Glucose (BG) and inject per scale: BG <150= 0 unit; BG 150-200= 1 unit; BG 201-250= 2 unit; BG 251-300= 3 unit; BG 301-350= 4 unit; BG 351-400= 5 unit; BG >400= 6 unit and Call Primary Care. What changed: Another medication with the same name was added. Make sure you understand how and when to take each.   insulin  aspart 100 UNIT/ML injection Commonly known as: novoLOG  Inject 0-20 Units into the skin 3 (three) times daily with meals. What changed: You were already taking a  medication with the same name, and this prescription was added. Make sure you understand how and when to take each.   insulin  glargine 100 UNIT/ML Solostar Pen Commonly known as: LANTUS  Inject 9 Units into the skin daily. May substitute as needed per insurance.   midodrine  5 MG tablet Commonly known as: PROAMATINE  Take 3 tablets (15 mg total) by mouth with breakfast, with lunch, and with evening meal. What changed:  medication strength how much to take when to take this   montelukast  10 MG tablet Commonly known as: SINGULAIR  Take 1 tablet (10 mg total) by mouth at bedtime.   omeprazole  40 MG capsule Commonly known as: PRILOSEC TAKE 1 CAPSULE (40 MG TOTAL) BY MOUTH IN THE MORNING   ondansetron  4 MG disintegrating  tablet Commonly known as: ZOFRAN -ODT Take 1 tablet (4 mg total) by mouth every 8 (eight) hours as needed for nausea or vomiting.   oxyCODONE  5 MG immediate release tablet Commonly known as: Oxy IR/ROXICODONE  Take 1 tablet (5 mg total) by mouth every 6 (six) hours as needed for moderate pain (pain score 4-6).   rOPINIRole  1 MG tablet Commonly known as: REQUIP  Take 1 tablet (1 mg total) by mouth at bedtime.   spironolactone  25 MG tablet Commonly known as: Aldactone  Take 1 tablet (25 mg total) by mouth daily.   Xifaxan  550 MG Tabs tablet Generic drug: rifaximin  Take 550 mg by mouth 2 (two) times daily.       Allergies  Allergen Reactions   Vancomycin  Itching   Amoxicillin Hives and Itching   Azithromycin      DOES NOT WORK   Dulaglutide  Other (See Comments)    constipation and stomach issues  **Trulicity **    Empagliflozin -Metformin  Hcl Er Other (See Comments) and Swelling   Januvia  [Sitagliptin ] Other (See Comments)    HURTS STOAMCH   Latex     IRRITATES VAGINAL AREA   Metformin      Other Reaction(s): achy all over   Morphine  Itching    Severe    Penicillins Hives    Has patient had a PCN reaction causing immediate rash, facial/tongue/throat  swelling, SOB or lightheadedness with hypotension: yes. Rash  Has patient had a PCN reaction causing severe rash involving mucus membranes or skin necrosis: Yes- rash and hives all over body  Has patient had a PCN reaction that required hospitalization No Has patient had a PCN reaction occurring within the last 10 years: Yes  If all of the above answers are NO, then may proceed with Cephalosporin use.    Vitamin K  And Related Other (See Comments)    Electric shock       The results of significant diagnostics from this hospitalization (including imaging, microbiology, ancillary and laboratory) are listed below for reference.    Significant Diagnostic Studies: IR Paracentesis Result Date: 02/03/2024 INDICATION: History of NASH cirrhosis, recurrent ascites. Request for diagnostic and therapeutic paracentesis. Most recent paracentesis 02/01/24 with 8.5 liters output. She is currently inpatient; she follows with Stephane Quest, NP for hepatology outpatient. EXAM: ULTRASOUND GUIDED DIAGNOSTIC AND THERAPEUTIC PARACENTESIS MEDICATIONS: 10 mL 1% lidocaine  with epi COMPLICATIONS: None immediate. PROCEDURE: Informed written consent was obtained from the patient after a discussion of the risks, benefits and alternatives to treatment. A timeout was performed prior to the initiation of the procedure. Initial ultrasound scanning demonstrates a moderate to large amount of ascites within the left lower abdominal quadrant. The left lower abdomen was prepped and draped in the usual sterile fashion. 1% lidocaine  was used for local anesthesia. Following this, a 5 Fr, One Step, 7-cm catheter was introduced. An ultrasound image was saved for documentation purposes. The paracentesis was performed. The catheter was removed and a dressing was applied. The patient tolerated the procedure well without immediate post procedural complication. Patient received post-procedure intravenous albumin ; see nursing notes for details.  FINDINGS: A total of approximately 4 of clear, yellow fluid was removed. Samples were sent to the laboratory as requested by the clinical team. IMPRESSION: Successful ultrasound-guided paracentesis yielding 4 liters of peritoneal fluid. Performed by Laymon Coast, NP under the supervision of Dr. Vanice. Electronically Signed   By: CHRISTELLA.  Shick M.D.   On: 02/03/2024 12:53   IR Paracentesis Result Date: 02/01/2024 INDICATION: History of NASH cirrhosis, recurrent ascites. Request for  diagnostic and therapeutic paracentesis. EXAM: ULTRASOUND GUIDED DIAGNOSTIC AND THERAPEUTIC PARACENTESIS MEDICATIONS: 8 mL 1% lidocaine  COMPLICATIONS: None immediate. PROCEDURE: Informed written consent was obtained from the patient after a discussion of the risks, benefits and alternatives to treatment. A timeout was performed prior to the initiation of the procedure. Initial ultrasound scanning demonstrates a large amount of ascites within the left lower abdominal quadrant. The left lower abdomen was prepped and draped in the usual sterile fashion. 1% lidocaine  was used for local anesthesia. Following this, a 19 gauge, 7-cm, Yueh catheter was introduced. An ultrasound image was saved for documentation purposes. The paracentesis was performed. The catheter was removed and a dressing was applied. The patient tolerated the procedure well without immediate post procedural complication. FINDINGS: A total of approximately 8.5 liters of hazy yellow fluid was removed. Samples were sent to the laboratory as requested by the clinical team. IMPRESSION: Successful ultrasound-guided paracentesis yielding 8.5 liters of peritoneal fluid. Performed by: Wyatt Pommier, PA-C Electronically Signed   By: CHRISTELLA.  Shick M.D.   On: 02/01/2024 12:38   CT ABDOMEN PELVIS W CONTRAST Result Date: 02/01/2024 CLINICAL DATA:  Acute abdominal pain and abdominal distension EXAM: CT ABDOMEN AND PELVIS WITH CONTRAST TECHNIQUE: Multidetector CT imaging of the abdomen  and pelvis was performed using the standard protocol following bolus administration of intravenous contrast. RADIATION DOSE REDUCTION: This exam was performed according to the departmental dose-optimization program which includes automated exposure control, adjustment of the mA and/or kV according to patient size and/or use of iterative reconstruction technique. CONTRAST:  75mL OMNIPAQUE  IOHEXOL  350 MG/ML SOLN COMPARISON:  01/06/2024 FINDINGS: Lower chest: No acute abnormality. Hepatobiliary: Liver is nodular and shrunken consistent with underlying cirrhotic change. Considerable ascites is identified. The gallbladder demonstrates dependent gallstones stable in appearance from the prior exam. No biliary ductal dilatation is seen. Pancreas: Unremarkable. No pancreatic ductal dilatation or surrounding inflammatory changes. Spleen: Spleen remains enlarged in size. Dilatation of the splenic vein is noted. Adrenals/Urinary Tract: Adrenal glands are within normal limits. Kidneys demonstrate a normal enhancement pattern bilaterally. No renal calculi or obstructive changes are seen. The bladder is well distended. Stomach/Bowel: No obstructive or inflammatory changes of the colon are seen. Mild scattered fecal material is seen. The appendix is air filled. Small bowel and stomach appear within normal limits. Small paraesophageal varices are seen. Vascular/Lymphatic: Prominence of the portal vein is noted consistent with the underlying portal hypertension. Mild para esophageal varices are seen. Recanalization of the umbilical vein is noted but diminutive size similar to that seen on prior exam. Reproductive: Uterus has been surgically removed. Ovaries appear within normal limits bilaterally. Other: Significant ascites is noted consistent with the underlying portal hypertension. This is increased when compared prior exam despite interval paracentesis. Musculoskeletal: No acute or significant osseous findings. IMPRESSION:  Cirrhotic change of the liver with underlying portal hypertension and ascites. Splenomegaly is noted as well. Cholelithiasis stable from the prior study. No acute abnormality noted. Electronically Signed   By: Oneil Devonshire M.D.   On: 02/01/2024 00:13   DG Chest Port 1 View Result Date: 01/31/2024 CLINICAL DATA:  Cough and fever EXAM: PORTABLE CHEST 1 VIEW COMPARISON:  01/13/2024 FINDINGS: The heart size and mediastinal contours are within normal limits. Both lungs are clear. The visualized skeletal structures are unremarkable. IMPRESSION: No active disease. Electronically Signed   By: Luke Bun M.D.   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 IR was requested  for therapeutic paracentesis. 8 L maximum. EXAM: ULTRASOUND GUIDED THERAPEUTIC PARACENTESIS MEDICATIONS: 4 cc of 1% lidocaine  with epi. COMPLICATIONS: None immediate. PROCEDURE: Informed written consent was obtained from the patient after a discussion of the risks, benefits and alternatives to treatment. A timeout was performed prior to the initiation of the procedure. Initial ultrasound scanning demonstrates a large amount of ascites within the right lower abdominal quadrant. The right lower abdomen was prepped and draped in the usual sterile fashion. 1% lidocaine  was used for local anesthesia. Following this, a 19 gauge, 7-cm, Yueh catheter was introduced. An ultrasound image was saved for documentation purposes. The paracentesis was performed. The catheter was removed and a dressing was applied. The patient tolerated the procedure well without immediate post procedural complication. Patient received post-procedure intravenous albumin ; see nursing notes for details. FINDINGS: A total of approximately 8.0 L of clear, straw-colored peritoneal fluid was removed. IMPRESSION: Successful ultrasound-guided paracentesis yielding 8.0 liters of peritoneal fluid.  Procedure performed by Carlin Griffon, PA-C Electronically Signed   By: Cordella Banner   On: 01/27/2024 16:55   IR Paracentesis Result Date: 01/20/2024 INDICATION: NASH cirrhosis with recurrent ascites. Consult for therapeutic paracentesis. EXAM: ULTRASOUND GUIDED THERAPEUTIC PARACENTESIS MEDICATIONS: 7 mL 1% lidocaine  COMPLICATIONS: None immediate. PROCEDURE: Informed written consent was obtained from the patient after a discussion of the risks, benefits and alternatives to treatment. A timeout was performed prior to the initiation of the procedure. Initial ultrasound scanning demonstrates a large amount of ascites within the left lower abdominal quadrant. The left lower abdomen was prepped and draped in the usual sterile fashion. 1% lidocaine  was used for local anesthesia. Following this, a 19 gauge, 7-cm, Yueh catheter was introduced. An ultrasound image was saved for documentation purposes. The paracentesis was performed. The catheter was removed and a dressing was applied. The patient tolerated the procedure well without immediate post procedural complication. Patient received post-procedure intravenous albumin ; see nursing notes for details. FINDINGS: A total of approximately 5.6 liters of clear yellow fluid was removed. IMPRESSION: Successful ultrasound-guided paracentesis yielding 5.6 liters of peritoneal fluid. Performed by: Kimble Clas, PA-C Electronically Signed   By: Cordella Banner   On: 01/20/2024 11:32   IR Paracentesis Result Date: 01/13/2024 INDICATION: Patient with a history of NASH cirrhosis with recurrent ascites. Interventional Radiology asked to perform a therapeutic paracentesis. EXAM: ULTRASOUND GUIDED PARACENTESIS MEDICATIONS: 1% lidocaine  10 mL COMPLICATIONS: None immediate. PROCEDURE: Informed written consent was obtained from the patient after a discussion of the risks, benefits and alternatives to treatment. A timeout was performed prior to the initiation of the procedure.  Initial ultrasound scanning demonstrates a large amount of ascites within the left lower abdominal quadrant. The left lower abdomen was prepped and draped in the usual sterile fashion. 1% lidocaine  was used for local anesthesia. Following this, a 19 gauge, 7-cm, Yueh catheter was introduced. An ultrasound image was saved for documentation purposes. The paracentesis was performed. The catheter was removed and a dressing was applied. The patient tolerated the procedure well without immediate post procedural complication. Patient received post-procedure intravenous albumin ; see nursing notes for details. FINDINGS: A total of approximately 6.3 L of clear yellow fluid was removed. IMPRESSION: Successful ultrasound-guided paracentesis yielding 6.3 liters of peritoneal fluid. Procedure performed by: Warren Dais, NP PLAN: Patient presented to IR today for an outpatient paracentesis. Patient became increasingly lethargic during paracentesis. Blood pressure slightly low, blood sugar 228. Per report from patient's sister the patient was advised to go to [facility], 01/12/24 for admission due  to reaching end stage liver disease with an increased urgency for liver transplant. Patient declined to go to [facility] After discussion with patient's sister (bedside) and husband (via telephone) the decision was made to take patient to the ED. The patient's husband called Stephane Quest, NP with [facility] and the goal is for an ED to ED transport to [facility] in [place]. Patient was taken to room 3 in the [facility]. An ED provider was not available and I left my cell number with the ED nurses. The patient was checked into room 3 by the ED nurses. The patient's sister is at the bedside. Electronically Signed   By: Juliene Balder M.D.   On: 01/13/2024 13:42   DG Chest Port 1 View Result Date: 01/13/2024 CLINICAL DATA:  Altered mental status. EXAM: PORTABLE CHEST 1 VIEW COMPARISON:  January 09, 2024. FINDINGS: The heart size and mediastinal contours are within normal limits. Both lungs are clear. The visualized skeletal structures are unremarkable. IMPRESSION: No active disease. Electronically Signed   By: Lynwood Landy Raddle M.D.   On: 01/13/2024 13:12   IR Paracentesis Result Date: 01/10/2024 INDICATION: 43 year old female with a history of NASH cirrhosis, recurrent ascites. IR is requested for diagnostic and therapeutic paracentesis. EXAM: ULTRASOUND GUIDED DIAGNOSTIC AND THERAPEUTIC PARACENTESIS MEDICATIONS: 6 cc of 1% lidocaine  COMPLICATIONS: None immediate. PROCEDURE: Informed written consent was obtained from the patient after a discussion of the risks, benefits and alternatives to treatment. A timeout was performed prior to the initiation of the procedure. Initial ultrasound scanning demonstrates a large amount of ascites within the right lower abdominal quadrant. The right lower abdomen was prepped and draped in the usual sterile fashion. 1% lidocaine  was used for local anesthesia. Following this, a 19 gauge, 7-cm, Yueh catheter was introduced. An ultrasound image was saved for documentation purposes. The paracentesis was performed. The catheter was removed and a dressing was applied. The patient tolerated the procedure well without immediate post procedural complication. Patient received post-procedure intravenous albumin ; see nursing notes for details. FINDINGS: A total of approximately 7.0 L of clear, straw-colored peritoneal fluid was removed. Samples were sent to the laboratory as requested by the clinical team. IMPRESSION: Successful ultrasound-guided paracentesis yielding 7.0 L liters of peritoneal fluid. Procedure performed by Carlin Griffon, PA-C Electronically Signed   By: Juliene Balder M.D.   On: 01/10/2024 14:18   DG Chest Portable 1 View Result Date: 01/09/2024 CLINICAL DATA:  Shortness of breath. EXAM: PORTABLE CHEST 1 VIEW COMPARISON:  Chest radiograph dated 12/15/2023 FINDINGS: No  focal consolidation, pleural effusion or pneumothorax. The cardiac silhouette is within normal limits. No acute osseous pathology. IMPRESSION: No active disease. Electronically Signed   By: Vanetta Chou M.D.   On: 01/09/2024 21:14   CT ABDOMEN PELVIS W CONTRAST Result Date: 01/06/2024 CLINICAL DATA:  Acute abdominal pain, history of cirrhosis and recent paracentesis EXAM: CT ABDOMEN AND PELVIS WITH CONTRAST TECHNIQUE: Multidetector CT imaging of the abdomen and pelvis was performed using the standard protocol following bolus administration of intravenous contrast. RADIATION DOSE REDUCTION: This exam was performed according to the departmental dose-optimization program which includes automated exposure control, adjustment of the mA and/or kV according to patient size and/or use of iterative reconstruction technique. CONTRAST:  75mL OMNIPAQUE  IOHEXOL  350 MG/ML SOLN COMPARISON:  12/18/2023 FINDINGS: Lower chest: No acute pleural or parenchymal lung disease. Hepatobiliary: Stable cirrhotic morphology of the liver. Calcified gallstones without evidence of acute cholecystitis. No biliary duct dilation. Pancreas: Unremarkable. No pancreatic  ductal dilatation or surrounding inflammatory changes. Spleen: Stable splenomegaly consistent with portal venous hypertension. No focal parenchymal splenic abnormality. Adrenals/Urinary Tract: Adrenal glands are unremarkable. Kidneys are normal, without renal calculi, focal lesion, or hydronephrosis. Bladder is unremarkable. Stomach/Bowel: No bowel obstruction or ileus. Continued mild bowel wall thickening most pronounced within the proximal colon, again most consistent with portal enteropathy and colopathy given cirrhosis and ascites. Normal appendix right lower quadrant. Vascular/Lymphatic: Dilated main portal vein consistent with portal venous hypertension, stable. Recanalized umbilical vein and upper abdominal varices unchanged. No pathologic adenopathy. Reproductive: Status  post hysterectomy. No adnexal masses. Other: Mild to moderate ascites, similar to prior CT. No free intraperitoneal gas. No abdominal wall hernia. Musculoskeletal: No acute or destructive bony abnormalities. Reconstructed images demonstrate no additional findings. IMPRESSION: 1. Stable findings of cirrhosis and portal venous hypertension, manifested by ascites, splenomegaly, and abdominal varices. 2. Continued wall thickening of the small and large bowel most consistent with portal colopathy/enteropathy given cirrhosis and presence of ascites. 3. Cholelithiasis without cholecystitis. Electronically Signed   By: Ozell Daring M.D.   On: 01/06/2024 16:19   IR Paracentesis Result Date: 01/06/2024 INDICATION: 42 year old female with history of NASH cirrhosis, recurrent ascites. Patient underwent paracentesis earlier today. However, presented to ED, and to ED is requesting diagnostic paracentesis. EXAM: ULTRASOUND GUIDED DIAGNOSTIC AND THERAPEUTIC PARACENTESIS MEDICATIONS: 5 cc of 1% lidocaine . COMPLICATIONS: None immediate. PROCEDURE: Informed written consent was obtained from the patient after a discussion of the risks, benefits and alternatives to treatment. A timeout was performed prior to the initiation of the procedure. Initial ultrasound scanning demonstrates a large amount of ascites within the right lower abdominal quadrant. The right lower abdomen was prepped and draped in the usual sterile fashion. 1% lidocaine  was used for local anesthesia. Following this, a 19 gauge, 7-cm, Yueh catheter was introduced. An ultrasound image was saved for documentation purposes. The paracentesis was performed. The catheter was removed and a dressing was applied. The patient tolerated the procedure well without immediate post procedural complication. FINDINGS: A total of approximately 1.0 L of clear, straw-colored peritoneal fluid was removed. Samples were sent to the laboratory as requested by the clinical team. IMPRESSION:  Successful ultrasound-guided paracentesis yielding 1.0 liters of peritoneal fluid. Procedure performed by Carlin Griffon, PA-C Electronically Signed   By: Marcey Moan M.D.   On: 01/06/2024 15:14   IR Paracentesis Result Date: 01/06/2024 INDICATION: 43 year old female with history of NASH cirrhosis, recurrent ascites. IR was requested for therapeutic paracentesis. 8 L maximum. EXAM: ULTRASOUND GUIDED THERAPEUTIC PARACENTESIS MEDICATIONS: 5 cc of 1% lidocaine . COMPLICATIONS: None immediate. PROCEDURE: Informed written consent was obtained from the patient after a discussion of the risks, benefits and alternatives to treatment. A timeout was performed prior to the initiation of the procedure. Initial ultrasound scanning demonstrates a large amount of ascites within the right lower abdominal quadrant. The right lower abdomen was prepped and draped in the usual sterile fashion. 1% lidocaine  was used for local anesthesia. Following this, a 19 gauge, 7-cm, Yueh catheter was introduced. An ultrasound image was saved for documentation purposes. The paracentesis was performed. The catheter was removed and a dressing was applied. The patient tolerated the procedure well without immediate post procedural complication. Patient received post-procedure intravenous albumin ; see nursing notes for details. FINDINGS: A total of approximately 8.0 L of clear, straw-colored peritoneal fluid was removed. IMPRESSION: Successful ultrasound-guided paracentesis yielding 8.0 liters of peritoneal fluid. Procedure performed by Carlin Griffon, PA-C Electronically Signed   By: Toribio Jearld HERO.D.  On: 01/06/2024 13:45    Microbiology: Recent Results (from the past 240 hours)  Resp panel by RT-PCR (RSV, Flu A&B, Covid) Anterior Nasal Swab     Status: None   Collection Time: 01/31/24 10:02 PM   Specimen: Anterior Nasal Swab  Result Value Ref Range Status   SARS Coronavirus 2 by RT PCR NEGATIVE NEGATIVE Final   Influenza A by PCR  NEGATIVE NEGATIVE Final   Influenza B by PCR NEGATIVE NEGATIVE Final    Comment: (NOTE) The Xpert Xpress SARS-CoV-2/FLU/RSV plus assay is intended as an aid in the diagnosis of influenza from Nasopharyngeal swab specimens and should not be used as a sole basis for treatment. Nasal washings and aspirates are unacceptable for Xpert Xpress SARS-CoV-2/FLU/RSV testing.  Fact Sheet for Patients: BloggerCourse.com  Fact Sheet for Healthcare Providers: SeriousBroker.it  This test is not yet approved or cleared by the [place]  FDA and has been authorized for detection and/or diagnosis of SARS-CoV-2 by FDA under an Emergency Use Authorization (EUA). This EUA will remain in effect (meaning this test can be used) for the duration of the COVID-19 declaration under Section 564(b)(1) of the Act, 21 U.S.C. section 360bbb-3(b)(1), unless the authorization is terminated or revoked.     Resp Syncytial Virus by PCR NEGATIVE NEGATIVE Final    Comment: (NOTE) Fact Sheet for Patients: BloggerCourse.com  Fact Sheet for Healthcare Providers: SeriousBroker.it  This test is not yet approved or cleared by the [place]  FDA and has been authorized for detection and/or diagnosis of SARS-CoV-2 by FDA under an Emergency Use Authorization (EUA). This EUA will remain in effect (meaning this test can be used) for the duration of the COVID-19 declaration under Section 564(b)(1) of the Act, 21 U.S.C. section 360bbb-3(b)(1), unless the authorization is terminated or revoked.  Performed at [facility], [address], [place], [place] [address]   Peritoneal fluid culture w Gram Stain     Status: None (Preliminary result)   Collection Time: 01/31/24 11:33 PM   Specimen: Peritoneal Washings; Peritoneal Fluid  Result Value Ref Range Status   Specimen Description PERITONEAL FLUID  Final   Special  Requests NONE  Final   Gram Stain NO WBC SEEN NO ORGANISMS SEEN   Final   Culture   Final    NO GROWTH 2 DAYS Performed at [facility], [address], [place], [place] [address]    Report Status PENDING  Incomplete  Gram stain     Status: None   Collection Time: 02/01/24 11:06 AM   Specimen: Abdomen; Peritoneal Fluid  Result Value Ref Range Status   Specimen Description PERITONEAL  Final   Special Requests NONE  Final   Gram Stain   Final    WBC PRESENT, PREDOMINANTLY MONONUCLEAR NO ORGANISMS SEEN CYTOSPIN SMEAR Performed at [facility], [address] [place], [place] [address]    Report Status 02/01/2024 FINAL  Final  Culture, body fluid w Gram Stain-bottle     Status: None (Preliminary result)   Collection Time: 02/01/24 11:06 AM   Specimen: Peritoneal Washings  Result Value Ref Range Status   Specimen Description PERITONEAL  Final   Special Requests NONE  Final   Culture   Final    NO GROWTH 2 DAYS Performed at [facility], [address] [place], [place] [address]    Report Status PENDING  Incomplete  Culture, blood (Routine X 2) w Reflex to ID Panel     Status: None (  Preliminary result)   Collection Time: 02/01/24  9:23 PM   Specimen: BLOOD RIGHT HAND  Result Value Ref Range Status   Specimen Description BLOOD RIGHT HAND  Final   Special Requests   Final    BOTTLES DRAWN AEROBIC AND ANAEROBIC Blood Culture adequate volume   Culture   Final    NO GROWTH 2 DAYS Performed at [facility], [address], [place], [place] [address]    Report Status PENDING  Incomplete  Culture, blood (Routine X 2) w Reflex to ID Panel     Status: None (Preliminary result)   Collection Time: 02/01/24  9:35 PM   Specimen: BLOOD LEFT ARM  Result Value Ref Range Status   Specimen Description BLOOD LEFT ARM  Final   Special Requests   Final    BOTTLES DRAWN AEROBIC AND ANAEROBIC Blood Culture adequate volume   Culture   Final    NO GROWTH 2  DAYS Performed at [facility], [address], [place], [place] [address]    Report Status PENDING  Incomplete     Labs: Basic Metabolic Panel: Recent Labs  Lab 01/31/24 2202 02/01/24 0554 02/01/24 2143 02/02/24 0642 02/03/24 0410  NA 128* 129* 134* 131* 130*  K 3.3* 3.4* 3.3* 4.1 3.5  CL 99 103 98 100 101  CO2 20* 21*  --  23 20*  GLUCOSE 335* 333* 326* 312* 333*  BUN 7 6 5* 6 6  CREATININE 0.76 0.75 0.60 0.66 0.65  CALCIUM  8.0* 7.8*  --  8.4* 8.3*   Liver Function Tests: Recent Labs  Lab 01/31/24 2202 02/01/24 0554 02/02/24 0642 02/03/24 0410  AST 38 34 47* 35  ALT 25 23 24 21   ALKPHOS 129* 94 90 68  BILITOT 2.9* 2.8* 2.5* 2.8*  PROT 6.7 5.7* 6.1* 5.9*  ALBUMIN  3.0* 2.5* 3.3* 3.1*   Recent Labs  Lab 01/31/24 2202  LIPASE 73*   Recent Labs  Lab 01/31/24 2202  AMMONIA 76*   CBC: Recent Labs  Lab 01/31/24 2202 02/01/24 0554 02/01/24 2143 02/02/24 0642 02/03/24 0410  WBC 2.1* 1.7*  --  1.5* 1.7*  NEUTROABS 1.3* 1.0*  --  0.9* 1.0*  HGB 8.8* 6.9* 8.5* 7.8* 7.7*  HCT 27.5* 22.0* 25.0* 24.4* 24.2*  MCV 90.2 91.7  --  90.0 90.3  PLT 70* 63*  --  57* 60*   Cardiac Enzymes: No results for input(s): CKTOTAL, CKMB, CKMBINDEX, TROPONINI in the last 168 hours. BNP: BNP (last 3 results) Recent Labs    12/15/23 0354  BNP 15.8    ProBNP (last 3 results) No results for input(s): PROBNP in the last 8760 hours.  CBG: Recent Labs  Lab 02/02/24 1625 02/02/24 2039 02/03/24 0744 02/03/24 1130 02/03/24 1633  GLUCAP 282* 265* 295* 266* 233*    Signed:  Colen Grimes MD   [facility] 02/03/2024, 5:13 PM

## 2024-02-04 LAB — BODY FLUID CULTURE W GRAM STAIN
Culture: NO GROWTH
Gram Stain: NONE SEEN

## 2024-02-06 ENCOUNTER — Telehealth: Payer: Self-pay

## 2024-02-06 LAB — CULTURE, BODY FLUID W GRAM STAIN -BOTTLE: Culture: NO GROWTH

## 2024-02-06 LAB — CULTURE, BLOOD (ROUTINE X 2)
Culture: NO GROWTH
Culture: NO GROWTH
Special Requests: ADEQUATE
Special Requests: ADEQUATE

## 2024-02-06 NOTE — Transitions of Care (Post Inpatient/ED Visit) (Signed)
   02/06/2024  Name: Laurie Sutton MRN: [id number] DOB: February 09, 1981  Today's TOC FU Call Status: Today's TOC FU Call Status:: Unsuccessful Call (1st Attempt) Unsuccessful Call (1st Attempt) Date: 02/06/24  Attempted to reach the patient regarding the most recent Inpatient/ED visit.  Follow Up Plan: Additional outreach attempts will be made to reach the patient to complete the Transitions of Care (Post Inpatient/ED visit) call.   Alan Ee, RN, BSN, CEN [employer]- Transition of Care Team.  [facility] [phone]

## 2024-02-06 NOTE — Transitions of Care (Post Inpatient/ED Visit) (Signed)
 02/06/2024  Name: Laurie Sutton MRN: [id number] DOB: 23-Apr-1980  Today's TOC FU Call Status: Today's TOC FU Call Status:: Successful TOC FU Call Completed TOC FU Call Complete Date: 02/06/24 Patient's Name and Date of Birth confirmed.  Transition Care Management Follow-up Telephone Call How have you been since you were released from the hospital?: Better Any questions or concerns?: No  Items Reviewed: Did you receive and understand the discharge instructions provided?: Yes Medications obtained,verified, and reconciled?: Yes (Medications Reviewed) Any new allergies since your discharge?: No Dietary orders reviewed?: Yes Type of Diet Ordered:: low salt, low carb Do you have support at home?: Yes People in Home [RPT]: spouse  Medications Reviewed Today: Medications Reviewed Today     Reviewed by Rumalda Alan PENNER, RN (Registered Nurse) on 02/06/24 at 1600  Med List Status: <None>   Medication Order Taking? Sig Documenting Provider Last Dose Status Informant  Continuous Glucose Sensor (DEXCOM G7 SENSOR) MISC [id number] Yes Use 1 sensor for continuous glucose monitoring every 10 days for 30 days Braulio Hough, MD  Active Self, Pharmacy Records  FLUoxetine  (PROZAC ) 40 MG capsule [id number] Yes Take 1 capsule (40 mg total) by mouth 2 (two) times daily. [name], MD  Active Self, Pharmacy Records  insulin  aspart (NOVOLOG ) 100 UNIT/ML FlexPen [id number] Yes Inject 0-6 Units into the skin 3 (three) times daily with meals. Check Blood Glucose (BG) and inject per scale: BG <150= 0 unit; BG 150-200= 1 unit; BG 201-250= 2 unit; BG 251-300= 3 unit; BG 301-350= 4 unit; BG 351-400= 5 unit; BG >400= 6 unit and Call Primary Care. Rojelio Nest, DO  Active Self, Pharmacy Records  insulin  aspart (NOVOLOG ) 100 UNIT/ML injection [id number] Yes Inject 0-20 Units into the skin 3 (three) times daily with meals. Samtani, Jai-Gurmukh, MD  Active   insulin  glargine (LANTUS ) 100 UNIT/ML Solostar Pen  [id number] Yes Inject 9 Units into the skin daily. May substitute as needed per insurance. Rojelio Nest, DO  Active Self, Pharmacy Records  lactulose  (CHRONULAC ) 10 GM/15ML solution [id number] Yes Take 30 mLs (20 g total) by mouth 4 (four) times daily.  Patient taking differently: Take 20 g by mouth 2 (two) times daily. For 10 days per discharge instructions from [facility] on 01/19/2024   Arlice Reichert, MD  Active Self, Pharmacy Records  midodrine  (PROAMATINE ) 5 MG tablet [id number] Yes Take 3 tablets (15 mg total) by mouth with breakfast, with lunch, and with evening meal. Samtani, Jai-Gurmukh, MD  Active   montelukast  (SINGULAIR ) 10 MG tablet [id number] Yes Take 1 tablet (10 mg total) by mouth at bedtime. [name], MD  Active Self, Pharmacy Records           Med Note LEOBARDO, NICOLE   Thu Dec 15, 2023  5:51 AM)    omeprazole  (PRILOSEC) 40 MG capsule [id number] Yes TAKE 1 CAPSULE (40 MG TOTAL) BY MOUTH IN THE MORNING Legrand Victory LITTIE DOUGLAS, MD  Active Self, Pharmacy Records  ondansetron  (ZOFRAN -ODT) 4 MG disintegrating tablet [id number] Yes Take 1 tablet (4 mg total) by mouth every 8 (eight) hours as needed for nausea or vomiting. Garrick Charleston, MD  Active Self, Pharmacy Records  oxyCODONE  (OXY IR/ROXICODONE ) 5 MG immediate release tablet [id number] Yes Take 1 tablet (5 mg total) by mouth every 6 (six) hours as needed for moderate pain (pain score 4-6). Samtani, Jai-Gurmukh, MD  Active   rOPINIRole  (REQUIP ) 1 MG tablet [id number] Yes Take 1 tablet (1 mg total) by mouth at bedtime. Johnny Garnette  A, MD  Active Self, Pharmacy Records  spironolactone  (ALDACTONE ) 25 MG tablet [id number]  Take 1 tablet (25 mg total) by mouth daily.  Patient not taking: Reported on 02/06/2024   Samtani, Jai-Gurmukh, MD  Active   XIFAXAN  550 MG TABS tablet [id number] Yes Take 550 mg by mouth 2 (two) times daily. [provider]  Active Self, Pharmacy Records           Med Note (WHITE, TONIA S   Tue Jan 10, 2024  2:46  AM)              Home Care and Equipment/Supplies: Were Home Health Services Ordered?: No Any new equipment or medical supplies ordered?: No  Functional Questionnaire: Do you need assistance with bathing/showering or dressing?: No Do you need assistance with meal preparation?: No Do you need assistance with eating?: No Do you have difficulty maintaining continence: No Do you need assistance with getting out of bed/getting out of a chair/moving?: No Do you have difficulty managing or taking your medications?: No  Follow up appointments reviewed: PCP Follow-up appointment confirmed?: No (will call today and make an appointment) MD Provider Line Number:[phone] Given: No Specialist Hospital Follow-up appointment confirmed?: Yes Date of Specialist follow-up appointment?: 02/10/24 Follow-Up Specialty Provider:: liver doctor Do you need transportation to your follow-up appointment?: No Do you understand care options if your condition(s) worsen?: Yes-patient verbalized understanding  SDOH Interventions Today    Flowsheet Row Most Recent Value  SDOH Interventions   Food Insecurity Interventions Intervention Not Indicated  Housing Interventions Intervention Not Indicated  Transportation Interventions Intervention Not Indicated  Utilities Interventions Intervention Not Indicated   Patient reports that she is very tired today.   Reports she has all her medications and is taking them as prescribed. Reports that she is taking her lactulose  as prescribed.  Patient reports that she has not weighed since hospital discharge and states that she has not monitored her BP or CBG. Reports that she is not home to weigh or check CBG during this call.   Interventions: Encouraged patient to see Pcp .  Reviewed specialist appointment with liver transplant team.  Encouraged patient to weigh daily. Reviewed importance of monitoring BP. Encouraged patient to put new CBG sensor on to monitor CBG.   Reviewed all medications and ensured patient is taking her medications as prescribed.  Reviewed importance of following low salt diet.  Reviewed and offered 30 day TOC program and patient has declined.    Alan Ee, RN, BSN, CEN [employer]- Transition of Care Team.  [facility] [phone]

## 2024-02-08 ENCOUNTER — Ambulatory Visit (HOSPITAL_COMMUNITY)
Admission: RE | Admit: 2024-02-08 | Discharge: 2024-02-08 | Disposition: A | Discharge status: Home / Self Care | Source: Ambulatory Visit | Attending: Nurse Practitioner | Admitting: Nurse Practitioner

## 2024-02-08 DIAGNOSIS — R188 Other ascites: Secondary | ICD-10-CM | POA: Insufficient documentation

## 2024-02-08 HISTORY — PX: IR PARACENTESIS: IMG2679

## 2024-02-08 MED ORDER — ALBUMIN HUMAN 25 % IV SOLN
INTRAVENOUS | Status: AC
  Filled 2024-02-08: qty 50

## 2024-02-08 MED ORDER — LIDOCAINE-EPINEPHRINE 1 %-1:100000 IJ SOLN
20.0000 mL | Freq: Once | INTRAMUSCULAR | Status: AC
  Administered 2024-02-08: 10 mL via INTRADERMAL

## 2024-02-08 MED ORDER — ALBUMIN HUMAN 25 % IV SOLN
INTRAVENOUS | Status: AC
  Filled 2024-02-08: qty 100

## 2024-02-08 MED ORDER — LIDOCAINE-EPINEPHRINE 1 %-1:100000 IJ SOLN
INTRAMUSCULAR | Status: AC
  Filled 2024-02-08: qty 1

## 2024-02-08 NOTE — Procedures (Signed)
 PROCEDURE SUMMARY:  Successful ultrasound guided paracentesis from the left lower quadrant.  Yielded 7.9 liters of straw fluid.  No immediate complications.  The patient tolerated the procedure well.   EBL < 2 mL  Patient currently on transplant list for [facility]

## 2024-02-09 ENCOUNTER — Emergency Department (HOSPITAL_COMMUNITY)
Admission: EM | Admit: 2024-02-09 | Discharge: 2024-02-10 | Disposition: A | Discharge status: Home / Self Care | Attending: Emergency Medicine | Admitting: Emergency Medicine

## 2024-02-09 DIAGNOSIS — Z794 Long term (current) use of insulin: Secondary | ICD-10-CM | POA: Insufficient documentation

## 2024-02-09 DIAGNOSIS — R188 Other ascites: Secondary | ICD-10-CM | POA: Diagnosis not present

## 2024-02-09 DIAGNOSIS — Z91040 Latex allergy status: Secondary | ICD-10-CM | POA: Insufficient documentation

## 2024-02-09 DIAGNOSIS — K7469 Other cirrhosis of liver: Secondary | ICD-10-CM | POA: Diagnosis not present

## 2024-02-09 DIAGNOSIS — R109 Unspecified abdominal pain: Secondary | ICD-10-CM

## 2024-02-09 DIAGNOSIS — E119 Type 2 diabetes mellitus without complications: Secondary | ICD-10-CM | POA: Insufficient documentation

## 2024-02-09 NOTE — ED Triage Notes (Addendum)
 Pt in POV with husband. States hx of liver failure, received paracentesis 02/08/24, today had sudden onset of swelling at site of paracentesis. Patient also reports nausea with pain.    Patient reports 2h ago taking home prescribed dilaudid  and benadryl  with no relief of pain.

## 2024-02-10 ENCOUNTER — Encounter (HOSPITAL_COMMUNITY): Payer: Self-pay

## 2024-02-10 ENCOUNTER — Other Ambulatory Visit: Payer: Self-pay

## 2024-02-10 ENCOUNTER — Emergency Department (HOSPITAL_COMMUNITY)

## 2024-02-10 LAB — URINALYSIS, ROUTINE W REFLEX MICROSCOPIC
Bacteria, UA: NONE SEEN
Bilirubin Urine: NEGATIVE
Glucose, UA: >=500 mg/dL — AB
Hgb urine dipstick: NEGATIVE
Ketones, ur: NEGATIVE mg/dL
Leukocytes,Ua: NEGATIVE
Nitrite: NEGATIVE
Protein, ur: NEGATIVE mg/dL
Specific Gravity, Urine: 1.02 (ref 1.005–1.030)
pH: 5 (ref 5.0–8.0)

## 2024-02-10 LAB — PROTIME-INR
INR: 1.8 — ABNORMAL HIGH (ref 0.8–1.2)
Prothrombin Time: 21.9 s — ABNORMAL HIGH (ref 11.4–15.2)

## 2024-02-10 LAB — CBC
HCT: 26.5 % — ABNORMAL LOW (ref 36.0–46.0)
Hemoglobin: 8.4 g/dL — ABNORMAL LOW (ref 12.0–15.0)
MCH: 28.1 pg (ref 26.0–34.0)
MCHC: 31.7 g/dL (ref 30.0–36.0)
MCV: 88.6 fL (ref 80.0–100.0)
Platelets: 61 K/uL — ABNORMAL LOW (ref 150–400)
RBC: 2.99 MIL/uL — ABNORMAL LOW (ref 3.87–5.11)
RDW: 15.7 % — ABNORMAL HIGH (ref 11.5–15.5)
WBC: 2.1 K/uL — ABNORMAL LOW (ref 4.0–10.5)
nRBC: 0 % (ref 0.0–0.2)

## 2024-02-10 LAB — COMPREHENSIVE METABOLIC PANEL WITH GFR
ALT: 25 U/L (ref 0–44)
AST: 34 U/L (ref 15–41)
Albumin: 3.4 g/dL — ABNORMAL LOW (ref 3.5–5.0)
Alkaline Phosphatase: 100 U/L (ref 38–126)
Anion gap: 12 (ref 5–15)
BUN: 5 mg/dL — ABNORMAL LOW (ref 6–20)
CO2: 22 mmol/L (ref 22–32)
Calcium: 8.3 mg/dL — ABNORMAL LOW (ref 8.9–10.3)
Chloride: 94 mmol/L — ABNORMAL LOW (ref 98–111)
Creatinine, Ser: 0.9 mg/dL (ref 0.44–1.00)
GFR, Estimated: >60 mL/min (ref >60)
Glucose, Bld: 372 mg/dL — ABNORMAL HIGH (ref 70–99)
Potassium: 3.6 mmol/L (ref 3.5–5.1)
Sodium: 128 mmol/L — ABNORMAL LOW (ref 135–145)
Total Bilirubin: 4.3 mg/dL — ABNORMAL HIGH (ref 0.0–1.2)
Total Protein: 6.6 g/dL (ref 6.5–8.1)

## 2024-02-10 LAB — MAGNESIUM: Magnesium: 1.8 mg/dL (ref 1.7–2.4)

## 2024-02-10 LAB — LIPASE, BLOOD: Lipase: 54 U/L — ABNORMAL HIGH (ref 11–51)

## 2024-02-10 MED ORDER — SODIUM CHLORIDE 0.9% FLUSH
3.0000 mL | Freq: Once | INTRAVENOUS | Status: AC
  Administered 2024-02-10: 3 mL via INTRAVENOUS

## 2024-02-10 MED ORDER — ONDANSETRON HCL 4 MG/2ML IJ SOLN
4.0000 mg | Freq: Once | INTRAMUSCULAR | Status: AC
  Administered 2024-02-10: 4 mg via INTRAVENOUS
  Filled 2024-02-10: qty 2

## 2024-02-10 MED ORDER — DIPHENHYDRAMINE HCL 50 MG/ML IJ SOLN
25.0000 mg | Freq: Once | INTRAMUSCULAR | Status: AC
  Administered 2024-02-10: 25 mg via INTRAVENOUS
  Filled 2024-02-10: qty 1

## 2024-02-10 MED ORDER — HYDROMORPHONE HCL 1 MG/ML IJ SOLN
0.5000 mg | Freq: Once | INTRAMUSCULAR | Status: AC
  Administered 2024-02-10: 0.5 mg via INTRAVENOUS
  Filled 2024-02-10: qty 1

## 2024-02-10 MED ORDER — IOHEXOL 350 MG/ML SOLN
75.0000 mL | Freq: Once | INTRAVENOUS | Status: AC | PRN
  Administered 2024-02-10: 75 mL via INTRAVENOUS

## 2024-02-10 MED ORDER — FAMOTIDINE IN NACL 20-0.9 MG/50ML-% IV SOLN
20.0000 mg | Freq: Once | INTRAVENOUS | Status: AC
  Administered 2024-02-10: 20 mg via INTRAVENOUS
  Filled 2024-02-10: qty 50

## 2024-02-10 NOTE — ED Provider Notes (Signed)
 [place] EMERGENCY DEPARTMENT AT [facility] Provider Note   CSN: [id number] Arrival date & time: 02/09/24  2343     Patient presents with: Abdominal Pain and Post-op Problem   Laurie Sutton is a 43 y.o. female.   The history is provided by the patient and the spouse.  Abdominal Pain  She has history of diabetes, NASH with cirrhosis complicated by ascites and hepatic encephalopathy and comes in because of abdominal swelling and pain.  She had a paracentesis done yesterday, and has noted swelling on the left side of her abdomen with pain in that location.  Paracentesis had been done in the left mid abdomen.  They had contacted the transplant center which was concerned about possible bleeding into the abdominal wall.  She has had some nausea but no vomiting.  She denies fever or chills.  There has been no change in mental status.    Prior to Admission medications   Medication Sig Start Date End Date Taking? Authorizing Provider  Continuous Glucose Sensor (DEXCOM G7 SENSOR) MISC Use 1 sensor for continuous glucose monitoring every 10 days for 30 days 05/25/23   Braulio Hough, MD  FLUoxetine  (PROZAC ) 40 MG capsule Take 1 capsule (40 mg total) by mouth 2 (two) times daily. 01/03/24   [name], MD  insulin  aspart (NOVOLOG ) 100 UNIT/ML FlexPen Inject 0-6 Units into the skin 3 (three) times daily with meals. Check Blood Glucose (BG) and inject per scale: BG <150= 0 unit; BG 150-200= 1 unit; BG 201-250= 2 unit; BG 251-300= 3 unit; BG 301-350= 4 unit; BG 351-400= 5 unit; BG >400= 6 unit and Call Primary Care. 01/11/24   Rojelio Nest, DO  insulin  aspart (NOVOLOG ) 100 UNIT/ML injection Inject 0-20 Units into the skin 3 (three) times daily with meals. 02/03/24   Samtani, Jai-Gurmukh, MD  insulin  glargine (LANTUS ) 100 UNIT/ML Solostar Pen Inject 9 Units into the skin daily. May substitute as needed per insurance. 01/11/24   Rojelio Nest, DO  lactulose  (CHRONULAC ) 10 GM/15ML  solution Take 30 mLs (20 g total) by mouth 4 (four) times daily. Patient taking differently: Take 20 g by mouth 2 (two) times daily. For 10 days per discharge instructions from [facility] on 01/19/2024 12/19/23 03/18/24  Arlice Reichert, MD  midodrine  (PROAMATINE ) 5 MG tablet Take 3 tablets (15 mg total) by mouth with breakfast, with lunch, and with evening meal. 02/03/24   Samtani, Jai-Gurmukh, MD  montelukast  (SINGULAIR ) 10 MG tablet Take 1 tablet (10 mg total) by mouth at bedtime. 08/25/22   [name], MD  omeprazole  (PRILOSEC) 40 MG capsule TAKE 1 CAPSULE (40 MG TOTAL) BY MOUTH IN THE MORNING 01/27/24   Legrand Victory LITTIE DOUGLAS, MD  ondansetron  (ZOFRAN -ODT) 4 MG disintegrating tablet Take 1 tablet (4 mg total) by mouth every 8 (eight) hours as needed for nausea or vomiting. 01/06/24   Garrick Charleston, MD  oxyCODONE  (OXY IR/ROXICODONE ) 5 MG immediate release tablet Take 1 tablet (5 mg total) by mouth every 6 (six) hours as needed for moderate pain (pain score 4-6). 02/03/24   Samtani, Jai-Gurmukh, MD  rOPINIRole  (REQUIP ) 1 MG tablet Take 1 tablet (1 mg total) by mouth at bedtime. 01/03/24   [name], MD  spironolactone  (ALDACTONE ) 25 MG tablet Take 1 tablet (25 mg total) by mouth daily. Patient not taking: Reported on 02/06/2024 02/03/24 02/02/25  Samtani, Jai-Gurmukh, MD  XIFAXAN  550 MG TABS tablet Take 550 mg by mouth 2 (two) times daily. 10/27/23  [provider]    Allergies: Vancomycin , Amoxicillin, Azithromycin , Dulaglutide , Empagliflozin -metformin  hcl er, Januvia  [sitagliptin ], Latex, Metformin , Morphine , Penicillins, and Vitamin k  and related    Review of Systems  Gastrointestinal:  Positive for abdominal pain.  All other systems reviewed and are negative.   Updated Vital Signs BP (!) 134/56 (BP Location: Left Arm)   Pulse 98   Temp 98.6 F (37 C)   Resp 20   Ht 5' 6 (1.676 m)   LMP 11/13/2020 (Exact Date)   SpO2 98%   BMI 28.72 kg/m   Physical Exam Vitals and  nursing note reviewed.   43 year old female, resting comfortably and in no acute distress. Vital signs are normal. Oxygen saturation is 98%, which is normal. Head is normocephalic and atraumatic. PERRLA, EOMI. erythema and swelling noted of the right upper eyelid with small pustule consistent with hordeolum. Neck is nontender and supple without adenopathy. Lungs are clear without rales, wheezes, or rhonchi. Chest is nontender. Heart has regular rate and rhythm with 1-2/6 systolic ejection murmur. Abdomen is soft, with fullness and mild to moderate tenderness over the left side of the abdomen.  There is no tenderness over the right side of the abdomen. Extremities have no cyanosis or edema. Neurologic: Mental status is normal, cranial nerves are intact, moves all extremities equally.  (all labs ordered are listed, but only abnormal results are displayed) Labs Reviewed  LIPASE, BLOOD - Abnormal; Notable for the following components:      Result Value   Lipase 54 (*)    All other components within normal limits  COMPREHENSIVE METABOLIC PANEL WITH GFR - Abnormal; Notable for the following components:   Sodium 128 (*)    Chloride 94 (*)    Glucose, Bld 372 (*)    BUN 5 (*)    Calcium  8.3 (*)    Albumin  3.4 (*)    Total Bilirubin 4.3 (*)    All other components within normal limits  CBC - Abnormal; Notable for the following components:   WBC 2.1 (*)    RBC 2.99 (*)    Hemoglobin 8.4 (*)    HCT 26.5 (*)    RDW 15.7 (*)    Platelets 61 (*)    All other components within normal limits  URINALYSIS, ROUTINE W REFLEX MICROSCOPIC - Abnormal; Notable for the following components:   Color, Urine AMBER (*)    Glucose, UA >=500 (*)    All other components within normal limits  PROTIME-INR - Abnormal; Notable for the following components:   Prothrombin Time 21.9 (*)    INR 1.8 (*)    All other components within normal limits  MAGNESIUM     Radiology: CT ABDOMEN PELVIS W CONTRAST Addendum  Date: 02/10/2024 ** ** ** ** ADDENDUM #1 ** ** ** ** ADDENDUM: Moderate volume simple fluid within the left posterolateral soft tissues of the mid lower abdomen suggestive of a peritoneal fluid leak in the setting of recent paracentesis. No soft tissue hematoma . Associated with the left abdominal walls and soft tissue edema. No significant soft tissue emphysema. No organized fluid collection. There is a 9mm discontinuity of the left lateral superficial abdominal musculature which is likely the site of the prior paracentesis. No peritoneal discontinuity or hernia identified. ---------------------------------------------------- Electronically signed by: Kate Plummer MD 02/10/2024 03:34 AM EDT RP Workstation: [id number]   Result Date: 02/10/2024 ** ** ** ** ORIGINAL REPORT ** ** ** ** EXAM: CT ABDOMEN AND PELVIS WITH CONTRAST 02/10/2024 02:36:02 AM TECHNIQUE:  CT of the abdomen and pelvis was performed with the administration of 75 mL of iohexol  (OMNIPAQUE ) 350 MG/ML injection. Multiplanar reformatted images are provided for review. Automated exposure control, iterative reconstruction, and/or weight-based adjustment of the mA/kV was utilized to reduce the radiation dose to as low as reasonably achievable. COMPARISON: CT abdomen and pelvis 01/31/2024. CLINICAL HISTORY: LLQ abdominal pain. FINDINGS: LOWER CHEST: No acute abnormality. LIVER: Nodular hepatic contour. Recanalized paraumbilical vein. GALLBLADDER AND BILE DUCTS: Calcified gallstone within gallbladder lumen. No gallbladder wall thickening. No biliary ductal dilatation. SPLEEN: Enlarged spleen measuring up to 19 cm. PANCREAS: No acute abnormality. ADRENAL GLANDS: No acute abnormality. KIDNEYS, URETERS AND BLADDER: Punctate left nephrolithiasis. No right nephrolithiasis. Subcentimeter hypodensity within the left kidney too small to characterize - no further follow-up indicated. No hydronephrosis. No perinephric or periureteral stranding. Urinary bladder is  unremarkable. GI AND BOWEL: Paraesophageal varices. No small or large bowel thickening or dilatation. Unremarkable appendix. There is no bowel obstruction. PERITONEUM AND RETROPERITONEUM: Interval decrease in small to moderate volume of simple free fluid consistent with ascites. No free air. VASCULATURE: Aorta is normal in caliber. Mild atherosclerotic plaque. LYMPH NODES: No lymphadenopathy. REPRODUCTIVE ORGANS: Hysterectomy. No adnexal mass. BONES AND SOFT TISSUES: No acute osseous abnormality. No focal soft tissue abnormality. IMPRESSION: 1. Cirrhosis with portal hypertension. No focal liver lesions identified. Please note that liver protocol-enhanced MR and CT are the most sensitive tests for the screening detection of hepatocellular carcinoma in the high-risk setting of cirrhosis. 2. Interval decrease in small-to-moderate volume simple free fluid ascites. 3. Cholelithiasis with no CT evidence of cholecystitis. 4. Nonobstructive punctate left nephrolithiasis. Electronically signed by: Morgane Naveau MD 02/10/2024 03:14 AM EDT RP Workstation: [id number]   IR Paracentesis Result Date: 02/08/2024 INDICATION: 43 year old female. History of NASH cirrhosis with recurrent ascites. Request therapeutic paracentesis. 8 L maximum EXAM: ULTRASOUND GUIDED LEFT-SIDED THERAPEUTIC PARACENTESIS MEDICATIONS: Lidocaine  1% 10 mL COMPLICATIONS: None immediate. PROCEDURE: Informed written consent was obtained from the patient after a discussion of the risks, benefits and alternatives to treatment. A timeout was performed prior to the initiation of the procedure. Initial ultrasound scanning demonstrates a large amount of ascites within the left lower abdominal quadrant. The left lower abdomen was prepped and draped in the usual sterile fashion. 1% lidocaine  was used for local anesthesia. Following this, a 19 gauge, 7-cm, Yueh catheter was introduced. An ultrasound image was saved for documentation purposes. The paracentesis was  performed. The catheter was removed and a dressing was applied. The patient tolerated the procedure well without immediate post procedural complication. Patient received post-procedure intravenous albumin ; see nursing notes for details. FINDINGS: A total of approximately 7.9 L of straw-colored fluid was removed. IMPRESSION: Successful ultrasound-guided therapeutic left-sided paracentesis yielding 7.9 liters of peritoneal fluid. Performed by Delon Beagle NP PLAN: Patient currently on the transplant list at [facility] Electronically Signed   By: CHRISTELLA.  Shick M.D.   On: 02/08/2024 11:18     Procedures   Medications Ordered in the ED  famotidine (PEPCID) IVPB 20 mg premix (20 mg Intravenous New Bag/Given 02/10/24 0334)  sodium chloride  flush (NS) 0.9 % injection 3 mL (3 mLs Intravenous Given 02/10/24 0131)  HYDROmorphone  (DILAUDID ) injection 0.5 mg (0.5 mg Intravenous Given 02/10/24 0126)  ondansetron  (ZOFRAN ) injection 4 mg (4 mg Intravenous Given 02/10/24 0125)  diphenhydrAMINE  (BENADRYL ) injection 25 mg (25 mg Intravenous Given 02/10/24 0125)  iohexol  (OMNIPAQUE ) 350 MG/ML injection 75 mL (75 mLs Intravenous Contrast Given 02/10/24 0227)  diphenhydrAMINE  (BENADRYL ) injection 25 mg (25  mg Intravenous Given 02/10/24 0327)                                    Medical Decision Making Amount and/or Complexity of Data Reviewed Labs: ordered. Radiology: ordered.  Risk Prescription drug management.   Left-sided abdominal swelling and pain following paracentesis.  This a presentation with a wide range of treatment options and carries with it a high risk of morbidity and complications.  Differential diagnosis include, but not limited to, peritoneal leak of ascitic fluid, hematoma.  There are no signs of bacterial peritonitis.  I reviewed her laboratory tests, my interpretation is stable hyponatremia, stable elevated glucose, improved hypoalbuminemia, borderline elevated lipase not felt to be  clinically significant, hyperbilirubinemia increased from recent value, pancytopenia which is stable.  Of note, hemoglobin has increased by 0.7 g compared with 02/03/2024.  I am ordering an INR, CT of abdomen and pelvis.  I reviewed her past records, and note paracentesis done on 02/08/2024.  Recent hospitalization for decompensated NASH (10/21-10/24) with MELD score 20 on discharge.  Will need INR result to calculate MELD score today.  INR is 1.8 giving a MELD score of 19 which is minimally improved compared with prior.  CT abdomen and pelvis shows changes of cirrhosis and portal hypertension with small to moderate volume ascites and incidental finding of cholelithiasis and nonobstructive punctate left nephrolithiasis.  I have reviewed the images, and noted significant amount of subcutaneous fluid on the left consistent with patient's physical exam.  I have independently discussed the findings with the radiologist who has reviewed the images and agrees that there is simple fluid on the left, no evidence of hematoma and no evidence of infection.  I feel that this is peritoneal fluid leak from her paracentesis, treatment is symptomatic.  I am discharging her home to continue symptomatic treatment.     Final diagnoses:  Left sided abdominal pain  Other cirrhosis of liver (HCC)  Other ascites    ED Discharge Orders     None          Raford Lenis, MD 02/10/24 [phone]

## 2024-02-10 NOTE — Discharge Instructions (Addendum)
 The swelling that you have is from leaking of your peritoneal fluid following of the paracentesis.  This will resolve on its own.  The only danger would be if you start running a fever.  If you do develop a fever, please return to the emergency department immediately.  Otherwise, continue taking your pain medication as needed.

## 2024-02-13 ENCOUNTER — Encounter: Payer: Self-pay | Admitting: Family Medicine

## 2024-02-13 ENCOUNTER — Ambulatory Visit (INDEPENDENT_AMBULATORY_CARE_PROVIDER_SITE_OTHER): Admitting: Family Medicine

## 2024-02-13 ENCOUNTER — Other Ambulatory Visit: Payer: Self-pay | Admitting: Nurse Practitioner

## 2024-02-13 VITALS — BP 98/58 | HR 95 | Temp 98.7°F | Wt 184.0 lb

## 2024-02-13 DIAGNOSIS — G4700 Insomnia, unspecified: Secondary | ICD-10-CM | POA: Insufficient documentation

## 2024-02-13 DIAGNOSIS — Z794 Long term (current) use of insulin: Secondary | ICD-10-CM

## 2024-02-13 DIAGNOSIS — E118 Type 2 diabetes mellitus with unspecified complications: Secondary | ICD-10-CM

## 2024-02-13 DIAGNOSIS — R188 Other ascites: Secondary | ICD-10-CM

## 2024-02-13 DIAGNOSIS — N390 Urinary tract infection, site not specified: Secondary | ICD-10-CM | POA: Diagnosis not present

## 2024-02-13 DIAGNOSIS — K7469 Other cirrhosis of liver: Secondary | ICD-10-CM

## 2024-02-13 LAB — POC URINALSYSI DIPSTICK (AUTOMATED)
Glucose, UA: NEGATIVE
Ketones, UA: NEGATIVE
Nitrite, UA: NEGATIVE
Protein, UA: POSITIVE — AB
Spec Grav, UA: 1.025 (ref 1.010–1.025)
Urobilinogen, UA: 1 U/dL
pH, UA: 6 (ref 5.0–8.0)

## 2024-02-13 MED ORDER — TEMAZEPAM 30 MG PO CAPS: 30.0000 mg | ORAL_CAPSULE | Freq: Every evening | ORAL | 5 refills | Status: DC | PRN

## 2024-02-13 MED ORDER — NITROFURANTOIN MONOHYD MACRO 100 MG PO CAPS: 100.0000 mg | ORAL_CAPSULE | Freq: Two times a day (BID) | ORAL | 0 refills | Status: DC

## 2024-02-13 NOTE — Progress Notes (Signed)
   Subjective:    Patient ID: Laurie Sutton, female    DOB: 12-13-1980, 43 y.o.   MRN: [id number]  HPI Here with her husband to follow up a hospital stay from 01-31-24 to 02-03-24 and then for an ED visit on 02-09-24. She presented to the ED on 01-31-24 complaining of generalized abdominal pain. Her labs were remarkable for WBC 2.1, Hgb 8.8, plt 70, Na 128, creat 0.76, GFR >60, and normal transaminases, and ammonia 76. A CT scan of the abdomen revealed significant ascites, portal gastropathy, and enlarged spleen . She was treated with numerous high volume paracenteses. Cultures of her blood and peritoneal fluids were negative. Then she went to the ED on 02-09-24 complaining of left sided abdominal pain. This was felt to be due to the ascites. She has seen [facility] twice in the past few weeks, and they have made no major changes. She is on the transplant list. Her diabetes has been stable, and her A1c on 01-14-24 was 6.9%. She continues to have abdominal swelling but no ankle swelling and no SOB. Her ECHO on 10-05-23 showed an EF of 60-65%, Grade II diastolic dysfunction, and trivial mitral valve regurgitation. She has 2 complaints today. First she has had urgency and frequency of urination for the past week. No fevers. She also has trouble initiating and maintaining sleep. Melatonin has not helped.    Review of Systems  Constitutional: Negative.   Respiratory: Negative.    Cardiovascular: Negative.   Gastrointestinal:  Positive for abdominal distention and nausea. Negative for abdominal pain, blood in stool, constipation, diarrhea and vomiting.  Genitourinary:  Positive for frequency and urgency. Negative for dysuria, flank pain and hematuria.       Objective:   Physical Exam Constitutional:      Comments: She appears to be uncomfortable   Cardiovascular:     Rate and Rhythm: Normal rate and regular rhythm.     Pulses: Normal pulses.     Comments: 2/6 SM  Pulmonary:     Effort: Pulmonary  effort is normal.     Breath sounds: Normal breath sounds.  Abdominal:     General: There is distension.  Musculoskeletal:     Right lower leg: No edema.     Left lower leg: No edema.  Neurological:     Mental Status: She is alert and oriented to person, place, and time. Mental status is at baseline.           Assessment & Plan:  She has a UTI, so we will treat with 7 days of Macrobid . Culture the sample. For insomnia she will try Temazepam  30 mg at bedtime. She will follow up with [facility] for the NASH and cirrhosis. Her type 2 diabetes is well controled. She is having weekly paracenteses. I personally spent a total of 35 minutes in the care of the patient today including getting/reviewing separately obtained history, performing a medically appropriate exam/evaluation, placing orders, independently interpreting results, and communicating results.  Garnette Olmsted, MD

## 2024-02-13 NOTE — Addendum Note (Signed)
 Addended by: LADONNA INOCENTE SAILOR on: 02/13/2024 11:53 AM   Modules accepted: Orders

## 2024-02-14 LAB — URINE CULTURE
MICRO NUMBER:: 17181819
Result:: NO GROWTH
SPECIMEN QUALITY:: ADEQUATE

## 2024-02-15 ENCOUNTER — Other Ambulatory Visit: Payer: Self-pay

## 2024-02-15 ENCOUNTER — Emergency Department (HOSPITAL_COMMUNITY)

## 2024-02-15 ENCOUNTER — Ambulatory Visit (HOSPITAL_COMMUNITY)
Admission: RE | Admit: 2024-02-15 | Discharge: 2024-02-15 | Disposition: A | Discharge status: Home / Self Care | Source: Ambulatory Visit | Attending: Nurse Practitioner | Admitting: Nurse Practitioner

## 2024-02-15 ENCOUNTER — Inpatient Hospital Stay (HOSPITAL_COMMUNITY)
Admission: EM | Admit: 2024-02-15 | Discharge: 2024-02-17 | DRG: 432 | Disposition: A | Discharge status: Home / Self Care | Attending: Family Medicine | Admitting: Family Medicine

## 2024-02-15 ENCOUNTER — Ambulatory Visit: Payer: Self-pay | Admitting: Family Medicine

## 2024-02-15 DIAGNOSIS — Z8616 Personal history of COVID-19: Secondary | ICD-10-CM

## 2024-02-15 DIAGNOSIS — Z79899 Other long term (current) drug therapy: Secondary | ICD-10-CM

## 2024-02-15 DIAGNOSIS — K920 Hematemesis: Principal | ICD-10-CM | POA: Diagnosis present

## 2024-02-15 DIAGNOSIS — K7581 Nonalcoholic steatohepatitis (NASH): Secondary | ICD-10-CM | POA: Diagnosis present

## 2024-02-15 DIAGNOSIS — R188 Other ascites: Secondary | ICD-10-CM | POA: Insufficient documentation

## 2024-02-15 DIAGNOSIS — N390 Urinary tract infection, site not specified: Secondary | ICD-10-CM | POA: Diagnosis present

## 2024-02-15 DIAGNOSIS — F32A Depression, unspecified: Secondary | ICD-10-CM | POA: Diagnosis present

## 2024-02-15 DIAGNOSIS — Z7682 Awaiting organ transplant status: Secondary | ICD-10-CM

## 2024-02-15 DIAGNOSIS — F909 Attention-deficit hyperactivity disorder, unspecified type: Secondary | ICD-10-CM | POA: Diagnosis present

## 2024-02-15 DIAGNOSIS — Z885 Allergy status to narcotic agent status: Secondary | ICD-10-CM

## 2024-02-15 DIAGNOSIS — F419 Anxiety disorder, unspecified: Secondary | ICD-10-CM | POA: Diagnosis present

## 2024-02-15 DIAGNOSIS — K317 Polyp of stomach and duodenum: Secondary | ICD-10-CM | POA: Diagnosis present

## 2024-02-15 DIAGNOSIS — Z794 Long term (current) use of insulin: Secondary | ICD-10-CM

## 2024-02-15 DIAGNOSIS — D61818 Other pancytopenia: Secondary | ICD-10-CM | POA: Diagnosis present

## 2024-02-15 DIAGNOSIS — Z880 Allergy status to penicillin: Secondary | ICD-10-CM

## 2024-02-15 DIAGNOSIS — Z881 Allergy status to other antibiotic agents status: Secondary | ICD-10-CM

## 2024-02-15 DIAGNOSIS — Z83719 Family history of colon polyps, unspecified: Secondary | ICD-10-CM

## 2024-02-15 DIAGNOSIS — Z90710 Acquired absence of both cervix and uterus: Secondary | ICD-10-CM

## 2024-02-15 DIAGNOSIS — K219 Gastro-esophageal reflux disease without esophagitis: Secondary | ICD-10-CM | POA: Diagnosis present

## 2024-02-15 DIAGNOSIS — Z8261 Family history of arthritis: Secondary | ICD-10-CM

## 2024-02-15 DIAGNOSIS — I8511 Secondary esophageal varices with bleeding: Secondary | ICD-10-CM | POA: Diagnosis present

## 2024-02-15 DIAGNOSIS — Z87442 Personal history of urinary calculi: Secondary | ICD-10-CM

## 2024-02-15 DIAGNOSIS — E1140 Type 2 diabetes mellitus with diabetic neuropathy, unspecified: Secondary | ICD-10-CM | POA: Diagnosis present

## 2024-02-15 DIAGNOSIS — Z818 Family history of other mental and behavioral disorders: Secondary | ICD-10-CM

## 2024-02-15 DIAGNOSIS — Z86018 Personal history of other benign neoplasm: Secondary | ICD-10-CM

## 2024-02-15 DIAGNOSIS — K7460 Unspecified cirrhosis of liver: Secondary | ICD-10-CM | POA: Diagnosis not present

## 2024-02-15 DIAGNOSIS — Z91040 Latex allergy status: Secondary | ICD-10-CM

## 2024-02-15 DIAGNOSIS — Z8052 Family history of malignant neoplasm of bladder: Secondary | ICD-10-CM

## 2024-02-15 DIAGNOSIS — K766 Portal hypertension: Secondary | ICD-10-CM | POA: Diagnosis present

## 2024-02-15 DIAGNOSIS — Z8701 Personal history of pneumonia (recurrent): Secondary | ICD-10-CM

## 2024-02-15 DIAGNOSIS — G43909 Migraine, unspecified, not intractable, without status migrainosus: Secondary | ICD-10-CM | POA: Diagnosis present

## 2024-02-15 DIAGNOSIS — Z825 Family history of asthma and other chronic lower respiratory diseases: Secondary | ICD-10-CM

## 2024-02-15 DIAGNOSIS — R040 Epistaxis: Secondary | ICD-10-CM | POA: Diagnosis present

## 2024-02-15 DIAGNOSIS — K7682 Hepatic encephalopathy: Secondary | ICD-10-CM | POA: Diagnosis present

## 2024-02-15 DIAGNOSIS — K648 Other hemorrhoids: Secondary | ICD-10-CM | POA: Diagnosis present

## 2024-02-15 DIAGNOSIS — Z888 Allergy status to other drugs, medicaments and biological substances status: Secondary | ICD-10-CM

## 2024-02-15 DIAGNOSIS — Z833 Family history of diabetes mellitus: Secondary | ICD-10-CM

## 2024-02-15 DIAGNOSIS — K3189 Other diseases of stomach and duodenum: Secondary | ICD-10-CM | POA: Diagnosis present

## 2024-02-15 DIAGNOSIS — K2970 Gastritis, unspecified, without bleeding: Secondary | ICD-10-CM | POA: Diagnosis present

## 2024-02-15 HISTORY — PX: IR PARACENTESIS: IMG2679

## 2024-02-15 LAB — COMPREHENSIVE METABOLIC PANEL WITH GFR
ALT: 26 U/L (ref 0–44)
AST: 50 U/L — ABNORMAL HIGH (ref 15–41)
Albumin: 3.7 g/dL (ref 3.5–5.0)
Alkaline Phosphatase: 110 U/L (ref 38–126)
Anion gap: 11 (ref 5–15)
BUN: 6 mg/dL (ref 6–20)
CO2: 23 mmol/L (ref 22–32)
Calcium: 8.8 mg/dL — ABNORMAL LOW (ref 8.9–10.3)
Chloride: 98 mmol/L (ref 98–111)
Creatinine, Ser: 0.94 mg/dL (ref 0.44–1.00)
GFR, Estimated: >60 mL/min (ref >60)
Glucose, Bld: 286 mg/dL — ABNORMAL HIGH (ref 70–99)
Potassium: 3.4 mmol/L — ABNORMAL LOW (ref 3.5–5.1)
Sodium: 132 mmol/L — ABNORMAL LOW (ref 135–145)
Total Bilirubin: 4.6 mg/dL — ABNORMAL HIGH (ref 0.0–1.2)
Total Protein: 7 g/dL (ref 6.5–8.1)

## 2024-02-15 LAB — CBC
HCT: 26.3 % — ABNORMAL LOW (ref 36.0–46.0)
Hemoglobin: 8.2 g/dL — ABNORMAL LOW (ref 12.0–15.0)
MCH: 28.2 pg (ref 26.0–34.0)
MCHC: 31.2 g/dL (ref 30.0–36.0)
MCV: 90.4 fL (ref 80.0–100.0)
Platelets: 66 K/uL — ABNORMAL LOW (ref 150–400)
RBC: 2.91 MIL/uL — ABNORMAL LOW (ref 3.87–5.11)
RDW: 16.3 % — ABNORMAL HIGH (ref 11.5–15.5)
WBC: 1.9 K/uL — ABNORMAL LOW (ref 4.0–10.5)
nRBC: 0 % (ref 0.0–0.2)

## 2024-02-15 LAB — I-STAT CHEM 8, ED
BUN: 5 mg/dL — ABNORMAL LOW (ref 6–20)
Calcium, Ion: 1.09 mmol/L — ABNORMAL LOW (ref 1.15–1.40)
Chloride: 97 mmol/L — ABNORMAL LOW (ref 98–111)
Creatinine, Ser: 0.6 mg/dL (ref 0.44–1.00)
Glucose, Bld: 281 mg/dL — ABNORMAL HIGH (ref 70–99)
HCT: 29 % — ABNORMAL LOW (ref 36.0–46.0)
Hemoglobin: 9.9 g/dL — ABNORMAL LOW (ref 12.0–15.0)
Potassium: 3.4 mmol/L — ABNORMAL LOW (ref 3.5–5.1)
Sodium: 136 mmol/L (ref 135–145)
TCO2: 23 mmol/L (ref 22–32)

## 2024-02-15 LAB — PROTIME-INR
INR: 1.8 — ABNORMAL HIGH (ref 0.8–1.2)
Prothrombin Time: 21.8 s — ABNORMAL HIGH (ref 11.4–15.2)

## 2024-02-15 LAB — LIPASE, BLOOD: Lipase: 51 U/L (ref 11–51)

## 2024-02-15 LAB — I-STAT CG4 LACTIC ACID, ED: Lactic Acid, Venous: 3.4 mmol/L (ref 0.5–1.9)

## 2024-02-15 LAB — PREPARE RBC (CROSSMATCH)

## 2024-02-15 LAB — AMMONIA: Ammonia: 92 umol/L — ABNORMAL HIGH (ref 9–35)

## 2024-02-15 LAB — HCG, SERUM, QUALITATIVE: Preg, Serum: NEGATIVE

## 2024-02-15 MED ORDER — FENTANYL CITRATE (PF) 50 MCG/ML IJ SOSY
50.0000 ug | PREFILLED_SYRINGE | Freq: Once | INTRAMUSCULAR | Status: AC
  Administered 2024-02-15: 50 ug via INTRAVENOUS
  Filled 2024-02-15: qty 1

## 2024-02-15 MED ORDER — LIDOCAINE HCL 1 % IJ SOLN
20.0000 mL | Freq: Once | INTRAMUSCULAR | Status: AC
  Administered 2024-02-15: 10 mL via INTRADERMAL

## 2024-02-15 MED ORDER — IOHEXOL 350 MG/ML SOLN
75.0000 mL | Freq: Once | INTRAVENOUS | Status: AC | PRN
  Administered 2024-02-15: 75 mL via INTRAVENOUS

## 2024-02-15 MED ORDER — LIDOCAINE HCL 1 % IJ SOLN
INTRAMUSCULAR | Status: AC
  Filled 2024-02-15: qty 20

## 2024-02-15 MED ORDER — SODIUM CHLORIDE 0.9 % IV SOLN
50.0000 ug/h | INTRAVENOUS | Status: DC
  Administered 2024-02-16 (×2): 50 ug/h via INTRAVENOUS
  Filled 2024-02-15 (×3): qty 1

## 2024-02-15 MED ORDER — PANTOPRAZOLE SODIUM 40 MG IV SOLR
80.0000 mg | Freq: Once | INTRAVENOUS | Status: AC
  Administered 2024-02-15: 80 mg via INTRAVENOUS
  Filled 2024-02-15: qty 20

## 2024-02-15 MED ORDER — ALBUMIN HUMAN 25 % IV SOLN
50.0000 g | Freq: Once | INTRAVENOUS | Status: AC
  Administered 2024-02-15: 50 g via INTRAVENOUS

## 2024-02-15 MED ORDER — OCTREOTIDE LOAD VIA INFUSION
50.0000 ug | Freq: Once | INTRAVENOUS | Status: AC
  Administered 2024-02-16: 50 ug via INTRAVENOUS
  Filled 2024-02-15: qty 25

## 2024-02-15 MED ORDER — ALBUMIN HUMAN 25 % IV SOLN
INTRAVENOUS | Status: AC
  Filled 2024-02-15: qty 200

## 2024-02-15 MED ORDER — LACTATED RINGERS IV BOLUS
500.0000 mL | Freq: Once | INTRAVENOUS | Status: AC
  Administered 2024-02-16: 500 mL via INTRAVENOUS

## 2024-02-15 MED ORDER — SODIUM CHLORIDE 0.9 % IV SOLN
1.0000 g | Freq: Once | INTRAVENOUS | Status: AC
  Administered 2024-02-15: 1 g via INTRAVENOUS
  Filled 2024-02-15: qty 10

## 2024-02-15 MED ORDER — LORAZEPAM 1 MG PO TABS
1.0000 mg | ORAL_TABLET | Freq: Once | ORAL | Status: AC
Start: 2024-02-15 — End: 2024-02-15
  Administered 2024-02-15: 1 mg via ORAL
  Filled 2024-02-15: qty 1

## 2024-02-15 NOTE — ED Provider Notes (Signed)
 [facility] Provider Note   CSN: [id number] Arrival date & time: 02/15/24  1942     History Chief Complaint  Patient presents with   Hematemesis    HPI: Laurie Sutton is a 43 y.o. female with history pertinent for cirrhosis related to NASH, T2DM, pancytopenia, elevated BMI who presents complaining of hematemesis. Patient arrived via POV accompanied by spouse/partner.  History provided by patient and spouse/partner.  No interpreter required during this encounter.  Patient and partner report that patient has a history of end-stage cirrhosis due to NASH.  They report that she is on the transplant list for a liver transplant, and has history of recurrent large-volume ascites, and requires weekly paracenteses with interventional radiology.  Reports that she has never previously had a GI bleed, however reports that she has previously been diagnosed with esophageal varices on endoscopy this past spring.  Reports that she also has a history of low normotension to high hypotension with blood pressures ranging systolics 90s to 110s and diastolics 40s to 60s at baseline.  Reports that she was in her normal state of health, reports that she went to her scheduled IR paracentesis this morning and had 7.5 L of fluid removed.  Reports that approximately 2 hours ago she experienced episode of emesis, followed by several episodes of frank red hematemesis.  Denies any clots, coffee-ground emesis, denies melena, hematochezia.  Denies fever, chills, chest pain, shortness of breath.  Reports that she has mild abdominal wall pain from the physical procedure of the paracentesis, however denies abdominal pain in general.  Patient's recorded medical, surgical, social, medication list and allergies were reviewed in the Snapshot window as part of the initial history.   Prior to Admission medications   Medication Sig Start Date End Date Taking? Authorizing Provider   baclofen (LIORESAL) 10 MG tablet Take 10 mg by mouth once a week. After paracentesis 02/10/24 02/09/25 Yes [provider]  Continuous Glucose Sensor (DEXCOM G7 SENSOR) MISC Use 1 sensor for continuous glucose monitoring every 10 days for 30 days 05/25/23  Yes Braulio Hough, MD  FLUoxetine  (PROZAC ) 40 MG capsule Take 1 capsule (40 mg total) by mouth 2 (two) times daily. 01/03/24  Yes [name], MD  insulin  regular human CONCENTRATED (HUMULIN  R) 500 UNIT/ML injection Inject 60-65 Units into the skin See admin instructions. 65 in the morning, 60 in the afternoon and 60 at bedtime   Yes [provider]  midodrine  (PROAMATINE ) 5 MG tablet Take 3 tablets (15 mg total) by mouth with breakfast, with lunch, and with evening meal. Patient taking differently: Take 10 mg by mouth 2 (two) times daily with a meal. 02/03/24  Yes Samtani, Jai-Gurmukh, MD  montelukast  (SINGULAIR ) 10 MG tablet Take 1 tablet (10 mg total) by mouth at bedtime. 08/25/22  Yes [name], MD  nitrofurantoin , macrocrystal-monohydrate, (MACROBID ) 100 MG capsule Take 1 capsule (100 mg total) by mouth 2 (two) times daily. 02/13/24  Yes [name], MD  ondansetron  (ZOFRAN -ODT) 4 MG disintegrating tablet Take 1 tablet (4 mg total) by mouth every 8 (eight) hours as needed for nausea or vomiting. 01/06/24  Yes Garrick Charleston, MD  pantoprazole  (PROTONIX ) 40 MG tablet Take 1 tablet (40 mg total) by mouth 2 (two) times daily. 02/17/24 03/18/24 Yes Leotis Bogus, MD  rOPINIRole  (REQUIP ) 1 MG tablet Take 1 tablet (1 mg total) by mouth at bedtime. 01/03/24  Yes [name], MD  temazepam  (RESTORIL ) 30 MG  capsule Take 1 capsule (30 mg total) by mouth at bedtime as needed for sleep. 02/13/24  Yes [name], MD  XIFAXAN  550 MG TABS tablet Take 550 mg by mouth 2 (two) times daily. 10/27/23  Yes [provider]  clotrimazole -betamethasone  (LOTRISONE ) cream Apply 1 Application topically daily as needed (rash).     [provider]  ergocalciferol  (VITAMIN D2) 1.25 MG (50000 UT) capsule Take 50,000 Units by mouth once a week.    [provider]  furosemide  (LASIX ) 20 MG tablet Take 1 tablet (20 mg total) by mouth daily. 02/22/24   Sherrill Cable Latif, DO  HYDROcodone -acetaminophen  (NORCO/VICODIN) 5-325 MG tablet Take 1 tablet by mouth every 6 (six) hours as needed for severe pain (pain score 7-10) or moderate pain (pain score 4-6). 02/21/24   Sherrill Cable Latif, DO  lactulose  (CHRONULAC ) 10 GM/15ML solution Take 30 mLs (20 g total) by mouth 2 (two) times daily. 02/21/24   Sheikh, Omair Latif, DO  polyethylene glycol (MIRALAX  / GLYCOLAX ) 17 g packet Take 17 g by mouth daily as needed for mild constipation. 02/21/24   Sherrill Cable Latif, DO  spironolactone  (ALDACTONE ) 50 MG tablet Take 1 tablet (50 mg total) by mouth daily. 02/22/24   Sherrill Cable Latif, DO     Allergies: Vancomycin , Amoxicillin, Azithromycin , Dulaglutide , Empagliflozin -metformin  hcl er, Januvia  [sitagliptin ], Latex, Metformin , Morphine , Penicillins, and Vitamin k  and related   Review of Systems   ROS as per HPI  Physical Exam Updated Vital Signs BP (!) 103/52 (BP Location: Right Arm)   Pulse 88   Temp 98.2 F (36.8 C) (Oral)   Resp 14   Ht 5' 6 (1.676 m)   Wt 83.5 kg   LMP 11/13/2020 (Exact Date)   SpO2 98%   BMI 29.71 kg/m  Physical Exam Vitals and nursing note reviewed.  Constitutional:      General: She is not in acute distress.    Appearance: Normal appearance. She is well-developed.  HENT:     Head: Normocephalic and atraumatic.  Eyes:     Extraocular Movements: Extraocular movements intact.     Conjunctiva/sclera: Conjunctivae normal.  Cardiovascular:     Rate and Rhythm: Normal rate and regular rhythm.     Pulses: Normal pulses.     Heart sounds: No murmur heard. Pulmonary:     Effort: Pulmonary effort is normal. No respiratory distress.     Breath sounds: Normal breath sounds.  Abdominal:      General: Abdomen is flat.     Palpations: Abdomen is soft.     Tenderness: There is no abdominal tenderness. There is no guarding or rebound.  Musculoskeletal:        General: No swelling.     Cervical back: Neck supple.  Skin:    General: Skin is warm and dry.     Capillary Refill: Capillary refill takes less than 2 seconds.     Coloration: Skin is jaundiced.  Neurological:     General: No focal deficit present.     Mental Status: She is alert and oriented to person, place, and time.  Psychiatric:        Mood and Affect: Mood normal.     ED Course/ Medical Decision Making/ A&P    Procedures Procedures   Medications Ordered in ED Medications  lactated ringers  infusion (0 mLs Intravenous Stopped 02/17/24 0832)  potassium chloride  10 mEq in 100 mL IVPB (10 mEq Intravenous Not Given 02/16/24 1142)  pantoprazole  (PROTONIX ) injection 80 mg (  80 mg Intravenous Given 02/15/24 2213)  cefTRIAXone  (ROCEPHIN ) 1 g in sodium chloride  0.9 % 100 mL IVPB (0 g Intravenous Stopped 02/15/24 2248)  fentaNYL  (SUBLIMAZE ) injection 50 mcg (50 mcg Intravenous Given 02/15/24 2307)  LORazepam  (ATIVAN ) tablet 1 mg (1 mg Oral Given 02/15/24 2307)  lactated ringers  bolus 500 mL (0 mLs Intravenous Stopped 02/16/24 0040)  iohexol  (OMNIPAQUE ) 350 MG/ML injection 75 mL (75 mLs Intravenous Contrast Given 02/15/24 2322)  octreotide (SANDOSTATIN) 2 mcg/mL load via infusion 50 mcg (50 mcg Intravenous Bolus from Bag 02/16/24 0006)  fentaNYL  (SUBLIMAZE ) injection 50 mcg (50 mcg Intravenous Given 02/16/24 0202)  0.9 %  sodium chloride  infusion (Manually program via Guardrails IV Fluids) (0 mLs Intravenous Stopping previously hung infusion 02/17/24 0700)  potassium chloride  SA (KLOR-CON  M) CR tablet 40 mEq (40 mEq Oral Given 02/16/24 2208)  methocarbamol (ROBAXIN) injection 500 mg (500 mg Intravenous Given 02/17/24 0109)    Medical Decision Making:   Laurie Sutton is a 43 y.o. female who presents for hematemesis as per  above.  Physical exam is pertinent for jaundice, no abdominal tenderness to palpation, no ongoing hematemesis.   The differential includes but is not limited to Mallory-Weiss tear, Boerhaave syndrome, gastritis, variceal hemorrhage.  Independent historian: Spouse/partner  External data reviewed: Labs: reviewed prior labs for baseline  Initial Plan:  Screening labs including CBC and Metabolic panel to evaluate for infectious or metabolic etiology of disease.  Lipase to evaluate for pancreatitis in the setting of emesis Coags and ammonia to evaluate for liver function/decompensated cirrhosis in the setting of patient's past medical history  Labs: Ordered, Independent interpretation, and Details: Lipase WNL.  CMP without AKI, emergent electrolyte derangement, emergent LFT abnormality, hyperbilirubinemia is at baseline for patient.  Hyperglycemia without anion gap elevation.  CBC with similar leukopenia and thrombocytopenia.  Hemoglobin 8.2 from baseline of 7.5-8.5.  Ammonia 92, similar to patient's baseline of approximately 70.  PT/INR elevations at baseline on comparison to prior.  Lactic acid elevated at 3.4, delta pending.  hCG negative  Radiology: Ordered, Independent interpretation, and Details: Personally viewed patient CT of the abdomen and pelvis, do not appreciate contrast blush consistent with ongoing extravasation in the area of the lower esophageal sphincter or stomach.  There is significant free fluid diffusely in the abdomen.  Radiology interpretation not yet available by the time of handoff, therefore I did not personally review the radiology interpretation.  Interventions: Protonix , ceftriaxone , Ativan , fentanyl , octreotide   See the EMR for full details regarding lab and imaging results.  Patient presents to the emergency department for hematemesis.  Has low normotension on arrival, however patient and partner at bedside reports that this is baseline for the patient, patient does  have known esophageal varices, however has not previously experienced bleeding to her knowledge.  Is not having ongoing bleeding on exam, however given high morbidity mortality of variceal hemorrhage, do feel that patient requires labs, imaging, and multimodal intervention given she is at high risk for rapid decompensation should she have underlying variceal hemorrhage.  Patient was verbally consented for blood, and 2 units were ordered, given patient is currently at hemodynamic baseline will not yet administer blood.  Additionally patient was ordered 80 IV Protonix , as well as 1 g ceftriaxone  for prevention of bacterial translocation in the setting of GI bleed (blood cultures ordered in the setting of administering IV antibiotics, however patient without other symptoms concerning for sepsis at this time).  Patient pain was treated with fentanyl , and nausea was  treated with Ativan  given history of QTc prolongation.  Labs ordered, and fortunately are largely at baseline including INR mildly elevated at patient's baseline, and anemia at baseline which is hopefully supportive against large-volume blood loss.  Patient remained free of hematemesis, however given poor baseline with baseline severe anemia to the sevens to eights, and low normotension at baseline, do feel that benefits of initiating octreotide GGT outweigh the risks, therefore octreotide also initiated prophylactically in the setting of suspected underlying variceal bleeding.  CTA of the abdomen and pelvis was obtained, I do not appreciate active extravasation, however radiology read is pending at the time of handoff, plan at the time of handoff, follow-up radiology read, engage IR/GI if needed should CT demonstrate underlying bleed, regardless of presence of active extravasation on CT, do feel that patient will likely require admission for further monitoring and workup.  Presentation is most consistent with acute life/limb-threatening illness and Current  presentation is complicated by underlying chronic conditions  Discussion of management or test interpretations with external provider(s): None by the time of handoff  Risk Drugs:Prescription drug management Treatment: Pending at the time of handoff, anticipate admission  Disposition: HANDOFF: At the time of signout, the patients CTA radiology read had not yet been completed. I transferred care of the patient at the time of signout to Dr. Griselda. I informed the incoming care provider of the patient's history, status, and management plan. I addressed all of their concerns and/or questions to the best of my ability. Please refer to the incoming care provider's note for details regarding the remainder of the patient's ED course and disposition.  MDM generated using voice dictation software and may contain dictation errors.  Please contact me for any clarification or with any questions.  Clinical Impression:  1. Hematemesis, unspecified whether nausea present   2. Cirrhosis of liver with ascites, unspecified hepatic cirrhosis type (HCC)      Admit   Final Clinical Impression(s) / ED Diagnoses Final diagnoses:  Hematemesis, unspecified whether nausea present  Cirrhosis of liver with ascites, unspecified hepatic cirrhosis type (HCC)    Rx / DC Orders ED Discharge Orders          Ordered    pantoprazole  (PROTONIX ) 40 MG tablet  2 times daily        02/17/24 0958    Increase activity slowly        02/17/24 0959    Diet - low sodium heart healthy        02/17/24 0959    Discharge instructions       Comments: Advised to follow-up with primary care physician in 1 week. Advised to follow-up with gastroenterology as scheduled. Advised to take pantoprazole  40 mg twice daily. Advised to continue with weekly paracentesis is scheduled.   02/17/24 0959    Diet Carb Modified        02/17/24 0959    Call MD for:  persistant nausea and vomiting        02/17/24 0959    Call MD for:  difficulty  breathing, headache or visual disturbances        02/17/24 0959             Rogelia Jerilynn RAMAN, MD 02/22/24 [phone]

## 2024-02-15 NOTE — ED Triage Notes (Signed)
 The  pt  had a paracentesis earlier today and she had 7.5 liters  of fluid removed

## 2024-02-15 NOTE — Procedures (Signed)
 PROCEDURE SUMMARY:  Successful image-guided paracentesis from the left lower abdomen.  Yielded 7.4 liters of slightly hazy yellow fluid.  No immediate complications.  EBL: trace Patient tolerated well.   Specimen not sent for labs.  Please see imaging section of Epic for full dictation.  Kimble DEL Kazia Grisanti PA-C 02/15/2024 12:15 PM

## 2024-02-16 ENCOUNTER — Inpatient Hospital Stay (HOSPITAL_COMMUNITY): Admitting: Anesthesiology

## 2024-02-16 ENCOUNTER — Encounter (HOSPITAL_COMMUNITY): Payer: Self-pay | Admitting: Emergency Medicine

## 2024-02-16 ENCOUNTER — Encounter (HOSPITAL_COMMUNITY)
Admission: EM | Disposition: A | Discharge status: Home / Self Care | Payer: Self-pay | Source: Home / Self Care | Attending: Family Medicine

## 2024-02-16 DIAGNOSIS — K317 Polyp of stomach and duodenum: Secondary | ICD-10-CM

## 2024-02-16 DIAGNOSIS — Z7682 Awaiting organ transplant status: Secondary | ICD-10-CM

## 2024-02-16 DIAGNOSIS — K7290 Hepatic failure, unspecified without coma: Secondary | ICD-10-CM

## 2024-02-16 DIAGNOSIS — K766 Portal hypertension: Secondary | ICD-10-CM

## 2024-02-16 DIAGNOSIS — E119 Type 2 diabetes mellitus without complications: Secondary | ICD-10-CM

## 2024-02-16 DIAGNOSIS — I8511 Secondary esophageal varices with bleeding: Secondary | ICD-10-CM | POA: Diagnosis present

## 2024-02-16 DIAGNOSIS — E1140 Type 2 diabetes mellitus with diabetic neuropathy, unspecified: Secondary | ICD-10-CM | POA: Diagnosis present

## 2024-02-16 DIAGNOSIS — K7460 Unspecified cirrhosis of liver: Secondary | ICD-10-CM | POA: Diagnosis present

## 2024-02-16 DIAGNOSIS — Z794 Long term (current) use of insulin: Secondary | ICD-10-CM | POA: Diagnosis not present

## 2024-02-16 DIAGNOSIS — I8500 Esophageal varices without bleeding: Secondary | ICD-10-CM

## 2024-02-16 DIAGNOSIS — D62 Acute posthemorrhagic anemia: Secondary | ICD-10-CM

## 2024-02-16 DIAGNOSIS — Z8052 Family history of malignant neoplasm of bladder: Secondary | ICD-10-CM | POA: Diagnosis not present

## 2024-02-16 DIAGNOSIS — K7469 Other cirrhosis of liver: Secondary | ICD-10-CM

## 2024-02-16 DIAGNOSIS — D61818 Other pancytopenia: Secondary | ICD-10-CM | POA: Diagnosis present

## 2024-02-16 DIAGNOSIS — K7682 Hepatic encephalopathy: Secondary | ICD-10-CM

## 2024-02-16 DIAGNOSIS — Z86018 Personal history of other benign neoplasm: Secondary | ICD-10-CM | POA: Diagnosis not present

## 2024-02-16 DIAGNOSIS — K7581 Nonalcoholic steatohepatitis (NASH): Secondary | ICD-10-CM | POA: Diagnosis present

## 2024-02-16 DIAGNOSIS — K2970 Gastritis, unspecified, without bleeding: Secondary | ICD-10-CM

## 2024-02-16 DIAGNOSIS — F32A Depression, unspecified: Secondary | ICD-10-CM | POA: Diagnosis present

## 2024-02-16 DIAGNOSIS — Z833 Family history of diabetes mellitus: Secondary | ICD-10-CM | POA: Diagnosis not present

## 2024-02-16 DIAGNOSIS — K920 Hematemesis: Secondary | ICD-10-CM | POA: Diagnosis present

## 2024-02-16 DIAGNOSIS — K3189 Other diseases of stomach and duodenum: Secondary | ICD-10-CM

## 2024-02-16 DIAGNOSIS — N390 Urinary tract infection, site not specified: Secondary | ICD-10-CM | POA: Diagnosis present

## 2024-02-16 DIAGNOSIS — R040 Epistaxis: Secondary | ICD-10-CM | POA: Diagnosis present

## 2024-02-16 DIAGNOSIS — K648 Other hemorrhoids: Secondary | ICD-10-CM | POA: Diagnosis present

## 2024-02-16 DIAGNOSIS — R188 Other ascites: Secondary | ICD-10-CM

## 2024-02-16 DIAGNOSIS — Z8616 Personal history of COVID-19: Secondary | ICD-10-CM | POA: Diagnosis not present

## 2024-02-16 DIAGNOSIS — Z825 Family history of asthma and other chronic lower respiratory diseases: Secondary | ICD-10-CM | POA: Diagnosis not present

## 2024-02-16 DIAGNOSIS — Z818 Family history of other mental and behavioral disorders: Secondary | ICD-10-CM | POA: Diagnosis not present

## 2024-02-16 HISTORY — PX: ESOPHAGOGASTRODUODENOSCOPY: SHX5428

## 2024-02-16 LAB — CBG MONITORING, ED
Glucose-Capillary: 94 mg/dL (ref 70–99)
Glucose-Capillary: 79 mg/dL (ref 70–99)

## 2024-02-16 LAB — CBC
HCT: 24 % — ABNORMAL LOW (ref 36.0–46.0)
Hemoglobin: 7.5 g/dL — ABNORMAL LOW (ref 12.0–15.0)
MCH: 27.9 pg (ref 26.0–34.0)
MCHC: 31.3 g/dL (ref 30.0–36.0)
MCV: 89.2 fL (ref 80.0–100.0)
Platelets: 62 K/uL — ABNORMAL LOW (ref 150–400)
RBC: 2.69 MIL/uL — ABNORMAL LOW (ref 3.87–5.11)
RDW: 16.5 % — ABNORMAL HIGH (ref 11.5–15.5)
WBC: 1.9 K/uL — ABNORMAL LOW (ref 4.0–10.5)
nRBC: 0 % (ref 0.0–0.2)

## 2024-02-16 LAB — URINALYSIS, ROUTINE W REFLEX MICROSCOPIC
Bacteria, UA: NONE SEEN
Bilirubin Urine: NEGATIVE
Glucose, UA: >=500 mg/dL — AB
Hgb urine dipstick: NEGATIVE
Ketones, ur: NEGATIVE mg/dL
Leukocytes,Ua: NEGATIVE
Nitrite: NEGATIVE
Protein, ur: NEGATIVE mg/dL
Specific Gravity, Urine: >1.046 — ABNORMAL HIGH (ref 1.005–1.030)
pH: 5 (ref 5.0–8.0)

## 2024-02-16 LAB — BASIC METABOLIC PANEL WITH GFR
Anion gap: 11 (ref 5–15)
BUN: 6 mg/dL (ref 6–20)
CO2: 24 mmol/L (ref 22–32)
Calcium: 8.4 mg/dL — ABNORMAL LOW (ref 8.9–10.3)
Chloride: 100 mmol/L (ref 98–111)
Creatinine, Ser: 1.5 mg/dL — ABNORMAL HIGH (ref 0.44–1.00)
GFR, Estimated: 44 mL/min — ABNORMAL LOW (ref >60)
Glucose, Bld: 92 mg/dL (ref 70–99)
Potassium: 3 mmol/L — ABNORMAL LOW (ref 3.5–5.1)
Sodium: 135 mmol/L (ref 135–145)

## 2024-02-16 LAB — HEMOGLOBIN AND HEMATOCRIT, BLOOD
HCT: 23.4 % — ABNORMAL LOW (ref 36.0–46.0)
HCT: 28.3 % — ABNORMAL LOW (ref 36.0–46.0)
Hemoglobin: 7.2 g/dL — ABNORMAL LOW (ref 12.0–15.0)
Hemoglobin: 9 g/dL — ABNORMAL LOW (ref 12.0–15.0)

## 2024-02-16 LAB — GLUCOSE, CAPILLARY
Glucose-Capillary: 110 mg/dL — ABNORMAL HIGH (ref 70–99)
Glucose-Capillary: 323 mg/dL — ABNORMAL HIGH (ref 70–99)
Glucose-Capillary: 389 mg/dL — ABNORMAL HIGH (ref 70–99)
Glucose-Capillary: 323 mg/dL — ABNORMAL HIGH (ref 70–99)

## 2024-02-16 LAB — PREPARE RBC (CROSSMATCH)

## 2024-02-16 LAB — I-STAT CG4 LACTIC ACID, ED: LACTIC ACID: 1.3 — NL (ref 0.5–1.99)

## 2024-02-16 LAB — PROTIME-INR
INR: 1.9 — ABNORMAL HIGH (ref 0.8–1.2)
Prothrombin Time: 22.3 s — ABNORMAL HIGH (ref 11.4–15.2)

## 2024-02-16 LAB — CG4 I-STAT (LACTIC ACID): Lactic Acid, Venous: 1.3 mmol/L (ref 0.5–1.9)

## 2024-02-16 SURGERY — EGD (ESOPHAGOGASTRODUODENOSCOPY)
Anesthesia: Monitor Anesthesia Care

## 2024-02-16 MED ORDER — PHENYLEPHRINE 80 MCG/ML (10ML) SYRINGE FOR IV PUSH (FOR BLOOD PRESSURE SUPPORT)
PREFILLED_SYRINGE | INTRAVENOUS | Status: DC | PRN
  Administered 2024-02-16 (×2): 160 ug via INTRAVENOUS

## 2024-02-16 MED ORDER — LIDOCAINE 2% (20 MG/ML) 5 ML SYRINGE
INTRAMUSCULAR | Status: DC | PRN
  Administered 2024-02-16: 100 mg via INTRAVENOUS

## 2024-02-16 MED ORDER — ORAL CARE MOUTH RINSE: 15.0000 mL | OROMUCOSAL | Status: DC | PRN

## 2024-02-16 MED ORDER — SODIUM CHLORIDE 0.9% IV SOLUTION: Freq: Once | INTRAVENOUS | Status: DC

## 2024-02-16 MED ORDER — POTASSIUM CHLORIDE 20 MEQ PO PACK: 40.0000 meq | PACK | Freq: Once | ORAL | Status: DC

## 2024-02-16 MED ORDER — LACTATED RINGERS IV SOLN: INTRAVENOUS | Status: AC

## 2024-02-16 MED ORDER — MIDODRINE HCL 5 MG PO TABS
15.0000 mg | ORAL_TABLET | Freq: Three times a day (TID) | ORAL | Status: DC
  Administered 2024-02-16 – 2024-02-17 (×3): 15 mg via ORAL
  Filled 2024-02-16 (×3): qty 3

## 2024-02-16 MED ORDER — SPIRONOLACTONE 25 MG PO TABS
25.0000 mg | ORAL_TABLET | Freq: Every day | ORAL | Status: DC
  Administered 2024-02-16 – 2024-02-17 (×2): 25 mg via ORAL
  Filled 2024-02-16 (×2): qty 1

## 2024-02-16 MED ORDER — SODIUM CHLORIDE 0.9% IV SOLUTION: Freq: Once | INTRAVENOUS | Status: AC

## 2024-02-16 MED ORDER — SODIUM CHLORIDE 0.9 % IV SOLN
2.0000 g | INTRAVENOUS | Status: DC
  Administered 2024-02-16 – 2024-02-17 (×2): 2 g via INTRAVENOUS
  Filled 2024-02-16 (×2): qty 20

## 2024-02-16 MED ORDER — FENTANYL CITRATE (PF) 50 MCG/ML IJ SOSY
50.0000 ug | PREFILLED_SYRINGE | Freq: Once | INTRAMUSCULAR | Status: AC
  Administered 2024-02-16: 50 ug via INTRAVENOUS
  Filled 2024-02-16: qty 1

## 2024-02-16 MED ORDER — RIFAXIMIN 550 MG PO TABS
550.0000 mg | ORAL_TABLET | Freq: Two times a day (BID) | ORAL | Status: DC
  Administered 2024-02-16 – 2024-02-17 (×3): 550 mg via ORAL
  Filled 2024-02-16 (×3): qty 1

## 2024-02-16 MED ORDER — OXYCODONE HCL 5 MG PO TABS
5.0000 mg | ORAL_TABLET | Freq: Four times a day (QID) | ORAL | Status: DC | PRN
  Administered 2024-02-16 – 2024-02-17 (×3): 5 mg via ORAL
  Filled 2024-02-16 (×3): qty 1

## 2024-02-16 MED ORDER — PROPOFOL 10 MG/ML IV BOLUS
INTRAVENOUS | Status: DC | PRN
  Administered 2024-02-16: 50 mg via INTRAVENOUS
  Administered 2024-02-16: 100 mg via INTRAVENOUS

## 2024-02-16 MED ORDER — INSULIN ASPART 100 UNIT/ML IJ SOLN
0.0000 [IU] | INTRAMUSCULAR | Status: DC
  Administered 2024-02-16: 7 [IU] via SUBCUTANEOUS
  Administered 2024-02-16: 9 [IU] via SUBCUTANEOUS
  Administered 2024-02-16: 7 [IU] via SUBCUTANEOUS
  Administered 2024-02-17 (×2): 3 [IU] via SUBCUTANEOUS
  Filled 2024-02-16: qty 9
  Filled 2024-02-16: qty 3
  Filled 2024-02-16: qty 7
  Filled 2024-02-16: qty 3
  Filled 2024-02-16: qty 7

## 2024-02-16 MED ORDER — POTASSIUM CHLORIDE 10 MEQ/100ML IV SOLN: 10.0000 meq | INTRAVENOUS | Status: AC

## 2024-02-16 MED ORDER — PANTOPRAZOLE SODIUM 40 MG IV SOLR
40.0000 mg | Freq: Two times a day (BID) | INTRAVENOUS | Status: DC
  Administered 2024-02-16 – 2024-02-17 (×3): 40 mg via INTRAVENOUS
  Filled 2024-02-16 (×3): qty 10

## 2024-02-16 MED ORDER — SODIUM CHLORIDE 0.9 % IV SOLN: INTRAVENOUS | Status: DC | PRN

## 2024-02-16 MED ORDER — POTASSIUM CHLORIDE CRYS ER 20 MEQ PO TBCR
40.0000 meq | EXTENDED_RELEASE_TABLET | Freq: Once | ORAL | Status: AC
  Administered 2024-02-16: 40 meq via ORAL
  Filled 2024-02-16: qty 2

## 2024-02-16 MED ORDER — LACTULOSE 10 GM/15ML PO SOLN
20.0000 g | Freq: Four times a day (QID) | ORAL | Status: DC
  Administered 2024-02-16 – 2024-02-17 (×5): 20 g via ORAL
  Filled 2024-02-16 (×5): qty 30

## 2024-02-16 NOTE — Consult Note (Signed)
 Consultation  Referring Provider: ERMD/Rees Primary Care Physician:  [name], MD Primary Gastroenterologist:  Dr.Danis  Reason for Consultation: GI bleeding, hematemesis  HPI: Laurie Sutton is a 43 y.o. female well-known to the GI service with severely decompensated [name] induced cirrhosis, refractory ascites now requiring weekly paracentesis hepatic encephalopathy, diabetes mellitus.  Patient is requiring frequent hospitalizations.  She is established with [facility] and is on the transplant list.  She was last seen by [facility] on 02/10/2024 after she was discharged from the hospital the previous week. Patient had undergone outpatient paracentesis yesterday scheduled with removal of 7.4 L. She states that yesterday afternoon she vomited dark red blood, then after that had a nosebleed with blood coming from her nose when she blows her nose.  She says she had a total of 4 episodes of hematemesis at home, did have some pain high in the epigastrium with the episodes of vomiting.  No syncope.  No complaints of abdominal pain, she has not had any melena. Hemodynamically stable on arrival late last evening and hemoglobin of 8.2/WBC 1.9/platelets 66, INR 1.9/potassium 3.0  This a.m. hemoglobin down to 7.5.  CTA was done last evening with no evidence of active arterial bleeding, there is wall thickening and mesenteric edema involving jejunal loops, cirrhosis with portal vein enlargement splenic varices recanalized umbilical vein moderate to large ascites unchanged and diffuse body wall edema, gallbladder wall edema present with small gallstones and diffuse colonic wall thickening/edema greatest in the ascending colon, moderate splenomegaly.  She has been started on IV PPI twice daily, octreotide infusion and ceftriaxone .  Last EGD April 2025 for follow-up of esophageal varices with finding of grade 2 varices in the middle third of the esophagus and lower third of the esophagus, portal  hypertensive gastropathy throughout the entire stomach with friability and flecks of heme,, there are mucosal changes characterized by nodularity in the gastric antrum there were 2 chains of this in the prepyloric area which could represent gastric varices despite the atypical location. Colonoscopy at that same setting with congested mucosa in the entire colon consistent with portal colopathy one 4 mm polyp in the transverse colon removed and internal hemorrhoids noted.  MELD 3.0: 26 at 02/16/2024  5:49 AM MELD-Na: 24 at 02/16/2024  5:49 AM Calculated from: Serum Creatinine: 1.5 mg/dL at 88/06/7972  4:50 AM Serum Sodium: 135 mmol/L at 02/16/2024  5:49 AM Total Bilirubin: 4.6 mg/dL at 88/07/7972  0:47 PM Serum Albumin : 3.7 g/dL (Using max of 3.5 g/dL) at 88/07/7972  0:47 PM INR(ratio): 1.9 at 02/16/2024  5:49 AM Age at listing (hypothetical): 43 years Sex: Female at 02/16/2024  5:49 AM    Past Medical History:  Diagnosis Date   ADHD    Allergy    Anemia    IRON TRANSFUSION 07-2020 NONE SINCE   Anxiety    Back pain    COVID 08/12/2019   ALL SYMPTOMS REOLVED PER PT   Depression    Diabetic neuropathy (HCC) 11/11/2020   FEET   dm type 2    sees Dr. Odella Jacobson at [facility]   Fatty liver    GERD (gastroesophageal reflux disease)    History of kidney stones    Joint pain    Menorrhagia 11/11/2020   Migraines    Murmur, cardiac    FAINT NO CARDIOLOGIST   Other fatigue    Pneumonia 08/12/2019   COVID PNEUMONIA ALL SYMPTOMS RESOLVED PER PT   Shortness of breath on exertion  Past Surgical History:  Procedure Laterality Date   CESAREAN SECTION  12/13/2006   COLONOSCOPY WITH PROPOFOL  N/A 08/02/2023   Procedure: COLONOSCOPY WITH PROPOFOL ;  Surgeon: Legrand Victory LITTIE DOUGLAS, MD;  Location: [facility];  Service: Gastroenterology;  Laterality: N/A;   ESOPHAGOGASTRODUODENOSCOPY (EGD) WITH PROPOFOL  N/A 08/02/2023   Procedure: ESOPHAGOGASTRODUODENOSCOPY (EGD) WITH PROPOFOL ;  Surgeon:  Legrand Victory LITTIE DOUGLAS, MD;  Location: [facility];  Service: Gastroenterology;  Laterality: N/A;   EXTRACORPOREAL SHOCK WAVE LITHOTRIPSY  2018   FLEXIBLE SIGMOIDOSCOPY N/A 11/20/2023   Procedure: KINGSTON SIDE;  Surgeon: Albertus Gordy HERO, MD;  Location: [facility];  Service: Gastroenterology;  Laterality: N/A;   FOOT SURGERY Right 10/02/2020   RIGHT FOOT HEEL AND TOES, SUR[facility] OFF [address]   IR PARACENTESIS  07/06/2023   IR PARACENTESIS  08/18/2023   IR PARACENTESIS  10/05/2023   IR PARACENTESIS  10/11/2023   IR PARACENTESIS  10/20/2023   IR PARACENTESIS  11/08/2023   IR PARACENTESIS  11/28/2023   IR PARACENTESIS  12/09/2023   IR PARACENTESIS  12/15/2023   IR PARACENTESIS  12/16/2023   IR PARACENTESIS  12/23/2023   IR PARACENTESIS  12/30/2023   IR PARACENTESIS  01/06/2024   IR PARACENTESIS  01/06/2024   IR PARACENTESIS  01/10/2024   IR PARACENTESIS  01/13/2024   IR PARACENTESIS  01/20/2024   IR PARACENTESIS  01/27/2024   IR PARACENTESIS  02/01/2024   IR PARACENTESIS  02/03/2024   IR PARACENTESIS  02/08/2024   IR PARACENTESIS  02/15/2024   IR TRANSCATHETER BX  07/26/2023   IR US  GUIDE VASC ACCESS RIGHT  07/26/2023   IR VENOGRAM HEPATIC W HEMODYNAMIC EVALUATION  07/26/2023   LAPAROSCOPIC VAGINAL HYSTERECTOMY WITH SALPINGECTOMY Bilateral 11/17/2020   Procedure: LAPAROSCOPIC ASSISTED VAGINAL HYSTERECTOMY WITH BILATERAL SALPINGECTOMY;  Surgeon: Dannielle Bouchard, DO;  Location: [facility];  Service: Gynecology;  Laterality: Bilateral;   POLYPECTOMY  08/02/2023   Procedure: POLYPECTOMY, INTESTINE;  Surgeon: Legrand Victory LITTIE DOUGLAS, MD;  Location: [facility];  Service: Gastroenterology;;    Prior to Admission medications   Medication Sig Start Date End Date Taking? Authorizing Provider  baclofen (LIORESAL) 10 MG tablet Take 10 mg by mouth once a week. After paracentesis 02/10/24 02/09/25 Yes [provider]  Continuous Glucose Sensor (DEXCOM G7 SENSOR) MISC Use 1 sensor for  continuous glucose monitoring every 10 days for 30 days 05/25/23  Yes Braulio Hough, MD  FLUoxetine  (PROZAC ) 40 MG capsule Take 1 capsule (40 mg total) by mouth 2 (two) times daily. 01/03/24  Yes [name], MD  insulin  regular human CONCENTRATED (HUMULIN  R) 500 UNIT/ML injection Inject 65 Units into the skin 3 (three) times daily with meals.   Yes [provider]  lactulose  (CHRONULAC ) 10 GM/15ML solution Take 30 mLs (20 g total) by mouth 4 (four) times daily. Patient taking differently: Take 20 g by mouth 2 (two) times daily. 12/19/23 03/18/24 Yes Dahal, Chapman, MD  midodrine  (PROAMATINE ) 5 MG tablet Take 3 tablets (15 mg total) by mouth with breakfast, with lunch, and with evening meal. Patient taking differently: Take 10 mg by mouth 2 (two) times daily with a meal. 02/03/24  Yes Samtani, Jai-Gurmukh, MD  montelukast  (SINGULAIR ) 10 MG tablet Take 1 tablet (10 mg total) by mouth at bedtime. 08/25/22  Yes [name], MD  nitrofurantoin , macrocrystal-monohydrate, (MACROBID ) 100 MG capsule Take 1 capsule (100 mg total) by mouth 2 (two) times daily. 02/13/24  Yes [name], MD  omeprazole  [facility])  40 MG capsule TAKE 1 CAPSULE (40 MG TOTAL) BY MOUTH IN THE MORNING 01/27/24  Yes Danis, Victory LITTIE MOULD, MD  ondansetron  (ZOFRAN -ODT) 4 MG disintegrating tablet Take 1 tablet (4 mg total) by mouth every 8 (eight) hours as needed for nausea or vomiting. 01/06/24  Yes Garrick Charleston, MD  oxyCODONE  (OXY IR/ROXICODONE ) 5 MG immediate release tablet Take 1 tablet (5 mg total) by mouth every 6 (six) hours as needed for moderate pain (pain score 4-6). 02/03/24  Yes Samtani, Jai-Gurmukh, MD  rOPINIRole  (REQUIP ) 1 MG tablet Take 1 tablet (1 mg total) by mouth at bedtime. 01/03/24  Yes [name], MD  spironolactone  (ALDACTONE ) 25 MG tablet Take 1 tablet (25 mg total) by mouth daily. 02/03/24 02/02/25 Yes Samtani, Jai-Gurmukh, MD  temazepam  (RESTORIL ) 30 MG capsule Take 1 capsule (30 mg total) by mouth  at bedtime as needed for sleep. 02/13/24  Yes [name], MD  XIFAXAN  550 MG TABS tablet Take 550 mg by mouth 2 (two) times daily. 10/27/23  Yes [provider]    Current Facility-Administered Medications  Medication Dose Route Frequency Provider Last Rate Last Admin   0.9 %  sodium chloride  infusion (Manually program via Guardrails IV Fluids)   Intravenous Once Glory Graefe S, PA-C       cefTRIAXone  (ROCEPHIN ) 2 g in sodium chloride  0.9 % 100 mL IVPB  2 g Intravenous Q24H Krugh, Marissa C, DO       insulin  aspart (novoLOG ) injection 0-9 Units  0-9 Units Subcutaneous Q4H Krugh, Marissa C, DO       lactated ringers  infusion   Intravenous Continuous Krugh, Marissa C, DO 75 mL/hr at 02/16/24 0552 New Bag at 02/16/24 0552   lactulose  (CHRONULAC ) 10 GM/15ML solution 20 g  20 g Oral QID Krugh, Marissa C, DO       midodrine  (PROAMATINE ) tablet 15 mg  15 mg Oral TID with meals Krugh, Marissa C, DO       octreotide (SANDOSTATIN) 500 mcg in sodium chloride  0.9 % 250 mL (2 mcg/mL) infusion  50 mcg/hr Intravenous Continuous Rogelia Jerilynn RAMAN, MD 25 mL/hr at 02/16/24 0006 50 mcg/hr at 02/16/24 0006   oxyCODONE  (Oxy IR/ROXICODONE ) immediate release tablet 5 mg  5 mg Oral Q6H PRN Krugh, Marissa C, DO       pantoprazole  (PROTONIX ) injection 40 mg  40 mg Intravenous Q12H Krugh, Marissa C, DO       potassium chloride  10 mEq in 100 mL IVPB  10 mEq Intravenous Q1 Hr x 4 Khatri, Pardeep, MD       rifaximin  (XIFAXAN ) tablet 550 mg  550 mg Oral BID Krugh, Marissa C, DO       spironolactone  (ALDACTONE ) tablet 25 mg  25 mg Oral Daily Krugh, Marissa C, DO       Current Outpatient Medications  Medication Sig Dispense Refill   baclofen (LIORESAL) 10 MG tablet Take 10 mg by mouth once a week. After paracentesis     Continuous Glucose Sensor (DEXCOM G7 SENSOR) MISC Use 1 sensor for continuous glucose monitoring every 10 days for 30 days 3 each 5   FLUoxetine  (PROZAC ) 40 MG capsule Take 1 capsule (40 mg  total) by mouth 2 (two) times daily. 180 capsule 3   insulin  regular human CONCENTRATED (HUMULIN  R) 500 UNIT/ML injection Inject 65 Units into the skin 3 (three) times daily with meals.     lactulose  (CHRONULAC ) 10 GM/15ML solution Take 30 mLs (20 g total) by mouth 4 (  four) times daily. (Patient taking differently: Take 20 g by mouth 2 (two) times daily.) 3784 mL 2   midodrine  (PROAMATINE ) 5 MG tablet Take 3 tablets (15 mg total) by mouth with breakfast, with lunch, and with evening meal. (Patient taking differently: Take 10 mg by mouth 2 (two) times daily with a meal.)     montelukast  (SINGULAIR ) 10 MG tablet Take 1 tablet (10 mg total) by mouth at bedtime. 90 tablet 3   nitrofurantoin , macrocrystal-monohydrate, (MACROBID ) 100 MG capsule Take 1 capsule (100 mg total) by mouth 2 (two) times daily. 14 capsule 0   omeprazole  (PRILOSEC) 40 MG capsule TAKE 1 CAPSULE (40 MG TOTAL) BY MOUTH IN THE MORNING 90 capsule 1   ondansetron  (ZOFRAN -ODT) 4 MG disintegrating tablet Take 1 tablet (4 mg total) by mouth every 8 (eight) hours as needed for nausea or vomiting. 20 tablet 0   oxyCODONE  (OXY IR/ROXICODONE ) 5 MG immediate release tablet Take 1 tablet (5 mg total) by mouth every 6 (six) hours as needed for moderate pain (pain score 4-6).     rOPINIRole  (REQUIP ) 1 MG tablet Take 1 tablet (1 mg total) by mouth at bedtime. 30 tablet 5   spironolactone  (ALDACTONE ) 25 MG tablet Take 1 tablet (25 mg total) by mouth daily. 30 tablet 11   temazepam  (RESTORIL ) 30 MG capsule Take 1 capsule (30 mg total) by mouth at bedtime as needed for sleep. 30 capsule 5   XIFAXAN  550 MG TABS tablet Take 550 mg by mouth 2 (two) times daily.      Allergies as of 02/15/2024 - Review Complete 02/15/2024  Allergen Reaction Noted   Vancomycin  Itching 10/04/2023   Amoxicillin Hives and Itching 02/16/2011   Azithromycin   07/25/2017   Dulaglutide  Other (See Comments) 08/21/2020   Empagliflozin -metformin  hcl er Other (See Comments) and  Swelling 08/21/2020   Januvia  [sitagliptin ] Other (See Comments) 11/18/2014   Latex  07/25/2017   Metformin   12/16/2022   Morphine  Itching 01/10/2024   Penicillins Hives 02/16/2011   Vitamin k  and related Other (See Comments) 11/19/2023    Family History  Problem Relation Age of Onset   Colon polyps Mother    Depression Mother    Anxiety disorder Mother    Colon polyps Father    Sleep apnea Father    Anxiety disorder Father    Depression Father    Diabetes Father    Bladder Cancer Father 60   Rheum arthritis Father    Migraines Maternal Aunt    Migraines Maternal Grandmother    Diabetes Maternal Grandfather    Healthy Daughter    Asthma Daughter    Anxiety disorder Daughter    Anxiety disorder Son    Asthma Son    Healthy Son    Cerebral aneurysm Cousin    Heart disease Other    Depression Other    Anxiety disorder Other    Sleep apnea Other    Colon cancer Neg Hx    Esophageal cancer Neg Hx    Stomach cancer Neg Hx    Rectal cancer Neg Hx     Social History   Socioeconomic History   Marital status: Married    Spouse name: Adam   Number of children: 2   Years of education: Not on file   Highest education level: 12th grade  Occupational History   Occupation: Arts Administrator and Nutrition    Employer: [employer]    Comment: works 20 hours per week  Tobacco Use   Smoking  status: Never    Passive exposure: Current   Smokeless tobacco: Never  Vaping Use   Vaping status: Never Used  Substance and Sexual Activity   Alcohol use: No    Alcohol/week: 0.0 standard drinks of alcohol   Drug use: No   Sexual activity: Yes    Partners: Male    Birth control/protection: None  Other Topics Concern   Not on file  Social History Narrative   Not on file   Social Drivers of Health   Financial Resource Strain: Low Risk  (09/19/2022)   Overall Financial Resource Strain (CARDIA)    Difficulty of Paying Living Expenses: Not hard at all  Food Insecurity: No Food  Insecurity (02/06/2024)   Hunger Vital Sign    Worried About Running Out of Food in the Last Year: Never true    Ran Out of Food in the Last Year: Never true  Transportation Needs: No Transportation Needs (02/06/2024)   PRAPARE - Administrator, Civil Service (Medical): No    Lack of Transportation (Non-Medical): No  Physical Activity: Insufficiently Active (06/04/2021)   Exercise Vital Sign    Days of Exercise per Week: 2 days    Minutes of Exercise per Session: 10 min  Stress: Stress Concern Present (09/19/2022)   [employer] of Occupational Health - Occupational Stress Questionnaire    Feeling of Stress : To some extent  Social Connections: Socially Integrated (10/05/2023)   Social Connection and Isolation Panel    Frequency of Communication with Friends and Family: More than three times a week    Frequency of Social Gatherings with Friends and Family: Twice a week    Attends Religious Services: More than 4 times per year    Active Member of [employer] or Organizations: No    Attends Engineer, Structural: More than 4 times per year    Marital Status: Married  Catering Manager Violence: Not At Risk (02/06/2024)   Humiliation, Afraid, Rape, and Kick questionnaire    Fear of Current or Ex-Partner: No    Emotionally Abused: No    Physically Abused: No    Sexually Abused: No    Review of Systems: Pertinent positive and negative review of systems were noted in the above HPI section.  All other review of systems was otherwise negative.   Physical Exam: Vital signs in last 24 hours: Temp:  [98 F (36.7 C)-98.8 F (37.1 C)] 98.8 F (37.1 C) (11/06 0554) Pulse Rate:  [86-102] 90 (11/06 0600) Resp:  [15-25] 22 (11/06 0600) BP: (100-128)/(47-91) 111/60 (11/06 0600) SpO2:  [95 %-100 %] 97 % (11/06 0600) Weight:  [83.5 kg] 83.5 kg (11/05 2113)   General:   Alert,  Well-developed, well-nourished, chronically ill-appearing young white female pleasant and cooperative  in NAD, mentation a bit slow but appropriate Head:  Normocephalic and atraumatic. Eyes:  Sclera clear, no icterus.   Conjunctiva pale Ears:  Normal auditory acuity. Nose:  No deformity, discharge,  or lesions. Mouth:  No deformity or lesions.   Neck:  Supple; no masses or thyromegaly. Lungs:  Clear throughout to auscultation.   No wheezes, crackles, or rhonchi.  Heart:  Regular rate and rhythm; no murmurs, clicks, rubs,  or gallops. Abdomen:  Soft, nontender, nontense ascites, bowel sounds are present Rectal: Not done Msk:  Symmetrical without gross deformities. . Pulses:  Normal pulses noted. Extremities:  Without clubbing or edema. Neurologic:  Alert and  oriented x3; mentation a bit slow but appropriate, positive asterixis  Skin:  Intact without significant lesions or rashes.. Psych:  Alert and cooperative. Normal mood and affect.  Intake/Output from previous day: No intake/output data recorded. Intake/Output this shift: No intake/output data recorded.  Lab Results: Recent Labs    02/15/24 2152 02/15/24 2215 02/16/24 0549  WBC 1.9*  --  1.9*  HGB 8.2* 9.9* 7.5*  HCT 26.3* 29.0* 24.0*  PLT 66*  --  62*   BMET Recent Labs    02/15/24 2152 02/15/24 2215 02/16/24 0549  NA 132* 136 135  K 3.4* 3.4* 3.0*  CL 98 97* 100  CO2 23  --  24  GLUCOSE 286* 281* 92  BUN 6 5* 6  CREATININE 0.94 0.60 1.50*  CALCIUM  8.8*  --  8.4*   LFT Recent Labs    02/15/24 2152  PROT 7.0  ALBUMIN  3.7  AST 50*  ALT 26  ALKPHOS 110  BILITOT 4.6*   PT/INR Recent Labs    02/15/24 2152 02/16/24 0549  LABPROT 21.8* 22.3*  INR 1.8* 1.9*   Hepatitis Panel No results for input(s): HEPBSAG, HCVAB, HEPAIGM, HEPBIGM in the last 72 hours.    IMPRESSION:  #41 43 year old white female with severely decompensated Nash cirrhosis, with refractory ascites requiring at least weekly paracentesis, pancytopenia, coagulopathy, hepatic encephalopathy, who is on the transplant list via  [facility]. She  has required frequent hospitalizations over the past few months.  MELD-Na=24  Patient had outpatient large-volume paracentesis yesterday with removal of 7.4 L  Onset last evening with hematemesis, patient relates 4 episodes of dark red blood emesis.  She did have an episode of epistaxis after the first episode of hematemesis, that has not persisted.  She has not had any melena overnight  Hemoglobin on arrival to the ER 8.2/down to 7.5 this a.m. She has been hemodynamically stable overnight  Etiology of the bleeding not clear, given that she has been very stable thus far less likely consistent with variceal hemorrhage, rule out bleed secondary to known portal gastropathy.  #2 diabetes mellitus insulin -dependent #3 hypokalemia-  #4 anemia acute on chronic secondary to GI bleeding #5 hepatic encephalopathy-chronic  PLAN: Keep n.p.o. Serial hemoglobins every 6 hours and transfuse for hemoglobin less than 7 Continue IV Rocephin  Continue IV PPI Continue octreotide infusion Vitamin K  this a.m. She will be scheduled for EGD with Dr. Albertus today.  Procedure was discussed in detail with the patient including indications risks and benefits and she is agreeable to proceed. GI will follow closely with you  Argie Applegate PA-C 02/16/2024, 8:56 AM

## 2024-02-16 NOTE — Progress Notes (Signed)
 PROGRESS NOTE    Laurie Sutton  FMW:[id number] DOB: [birth year] DOA: 02/15/2024 PCP: [name], MD   Brief Narrative:  This 43 yrs old female with PMH significant for NASH cirrhosis with esophageal varices requiring weekly paracentesis, presents to the emergency department for evaluation of 4 episodes of hematemesis started yesterday evening.  She denies nausea, abdominal pain, bloody or melanotic stools. She had paracentesis earlier in the day yesterday which yielded 7.4 L.  Her husband notes She was somewhat confused afterwards.  She is also currently being treated for a UTI.  She was hemodynamically stable in the ED.  Hemoglobin 9.9.  Patient has not had any further episodes of vomiting.  CTA abdominal without active GI bleeding.  Patient was admitted for further evaluation. GI was consulted.     Assessment & Plan:   Principal Problem:   Hematemesis  Hematemesis: Patient presented with 4 episodes of hematemesis yesterday. Hemodynamically stable.  CT abdomen negative for acute GI bleeding. Hemoglobin 9.9 dropped to 7.5 today. Continue to monitor H&H every 6 hours.   Transfuse if hemoglobin drops below 7. Continue empiric ceftriaxone . Patient was continued on IV octreotide infusion. Continue IV Protonix  BID  Continue IV fluid resuscitation. GI consulted patient underwent EGD found to have no active bleeding. EGD shows grade 2 esophageal varices with no stigmata of bleeding. Continue to observe for tonight if H&H is stable , Patient can be discharged tomorrow.   NASH cirrhosis complicated by recurrent ascites requiring weekly paracentesis: Patient had paracentesis yesterday , 7.5 L clear fluid drained. Continue lactulose  and rifaximin . Continue spironolactone     UTI: Currently on Macrobid  outpatient, will hold this while on Rocephin . Resume when patient ready for discharge.    DVT prophylaxis: SCDs Code Status: Full code Family Communication: (No family at bed  side) Disposition Plan:    Status is: Inpatient Remains inpatient appropriate because: Admitted for hematemesis, undergoing EGD today    Consultants:  Gastroenterology  Procedures: EGD today  Antimicrobials:  Anti-infectives (From admission, onward)    Start     Dose/Rate Route Frequency Ordered Stop   02/16/24 1000  rifaximin  (XIFAXAN ) tablet 550 mg        550 mg Oral 2 times daily 02/16/24 0420     02/16/24 0800  cefTRIAXone  (ROCEPHIN ) 2 g in sodium chloride  0.9 % 100 mL IVPB        2 g 200 mL/hr over 30 Minutes Intravenous Every 24 hours 02/16/24 0420     02/15/24 2145  cefTRIAXone  (ROCEPHIN ) 1 g in sodium chloride  0.9 % 100 mL IVPB        1 g 200 mL/hr over 30 Minutes Intravenous  Once 02/15/24 2135 02/15/24 2248      Subjective: Patient was seen and examined at bedside.  Overnight events noted. Patient reports feeling improved.  Patient came from EGD,  reports was told has no bleeding.  Objective: Vitals:   02/16/24 1033 02/16/24 1040 02/16/24 1050 02/16/24 1100  BP: (!) 101/48 (!) 106/53 (!) 96/54 (!) 102/59  Pulse: 86 85 85 85  Resp: (!) 21 (!) 21 18 19   Temp:      TempSrc:      SpO2: 94% 93% 93% 93%  Weight:      Height:        Intake/Output Summary (Last 24 hours) at 02/16/2024 1121 Last data filed at 02/16/2024 1022 Gross per 24 hour  Intake 500 ml  Output --  Net 500 ml   [employer]  02/15/24 2110 02/15/24 2113  Weight: 83.5 kg 83.5 kg    Examination:  General exam: Appears calm and comfortable, not in any acute distress. Respiratory system: CTA Bilaterally. Respiratory effort normal. RR 14 Cardiovascular system: S1 & S2 heard, RRR. No JVD, murmurs, rubs, gallops or clicks.  Gastrointestinal system: Abdomen is non distended, soft and non tender. Normal bowel sounds heard. Central nervous system: Alert and oriented x 3. No focal neurological deficits. Extremities: No edema, no cyanosis, no clubbing. Skin: No rashes, lesions or  ulcers Psychiatry: Judgement and insight appear normal. Mood & affect appropriate.   Data Reviewed: I have personally reviewed following labs and imaging studies  CBC: Recent Labs  Lab 02/10/24 0015 02/15/24 2152 02/15/24 2215 02/16/24 0549  WBC 2.1* 1.9*  --  1.9*  HGB 8.4* 8.2* 9.9* 7.5*  HCT 26.5* 26.3* 29.0* 24.0*  MCV 88.6 90.4  --  89.2  PLT 61* 66*  --  62*   Basic Metabolic Panel: Recent Labs  Lab 02/10/24 0015 02/10/24 0118 02/15/24 2152 02/15/24 2215 02/16/24 0549  NA 128*  --  132* 136 135  K 3.6  --  3.4* 3.4* 3.0*  CL 94*  --  98 97* 100  CO2 22  --  23  --  24  GLUCOSE 372*  --  286* 281* 92  BUN 5*  --  6 5* 6  CREATININE 0.90  --  0.94 0.60 1.50*  CALCIUM  8.3*  --  8.8*  --  8.4*  MG  --  1.8  --   --   --    GFR: Estimated Creatinine Clearance: 52.7 mL/min (A) (by C-G formula based on SCr of 1.5 mg/dL (H)). Liver Function Tests: Recent Labs  Lab 02/10/24 0015 02/15/24 2152  AST 34 50*  ALT 25 26  ALKPHOS 100 110  BILITOT 4.3* 4.6*  PROT 6.6 7.0  ALBUMIN  3.4* 3.7   Recent Labs  Lab 02/10/24 0015 02/15/24 2152  LIPASE 54* 51   Recent Labs  Lab 02/15/24 2152  AMMONIA 92*   Coagulation Profile: Recent Labs  Lab 02/10/24 0118 02/15/24 2152 02/16/24 0549  INR 1.8* 1.8* 1.9*   Cardiac Enzymes: No results for input(s): CKTOTAL, CKMB, CKMBINDEX, TROPONINI in the last 168 hours. BNP (last 3 results) No results for input(s): PROBNP in the last 8760 hours. HbA1C: No results for input(s): HGBA1C in the last 72 hours. CBG: Recent Labs  Lab 02/16/24 0553 02/16/24 0808  GLUCAP 94 79   Lipid Profile: No results for input(s): CHOL, HDL, LDLCALC, TRIG, CHOLHDL, LDLDIRECT in the last 72 hours. Thyroid  Function Tests: No results for input(s): TSH, T4TOTAL, FREET4, T3FREE, THYROIDAB in the last 72 hours. Anemia Panel: No results for input(s): VITAMINB12, FOLATE, FERRITIN, TIBC, IRON,  RETICCTPCT in the last 72 hours. Sepsis Labs: Recent Labs  Lab 02/15/24 2203 02/16/24 0213  LATICACIDVEN 3.4* 1.3    Recent Results (from the past 240 hours)  Culture, Urine     Status: None   Collection Time: 02/13/24 11:14 AM   Specimen: Urine  Result Value Ref Range Status   MICRO NUMBER: [id number]  Final   SPECIMEN QUALITY: Adequate  Final   Sample Source URINE  Final   STATUS: FINAL  Final   Result: No Growth  Final  Blood culture (routine x 2)     Status: None (Preliminary result)   Collection Time: 02/15/24  9:52 PM   Specimen: BLOOD RIGHT FOREARM  Result Value Ref Range Status  Specimen Description BLOOD RIGHT FOREARM  Final   Special Requests   Final    BOTTLES DRAWN AEROBIC AND ANAEROBIC Blood Culture adequate volume   Culture   Final    NO GROWTH < 12 HOURS Performed at [facility], [address] [place], [place] [address]    Report Status PENDING  Incomplete  Blood culture (routine x 2)     Status: None (Preliminary result)   Collection Time: 02/15/24  9:52 PM   Specimen: BLOOD LEFT ARM  Result Value Ref Range Status   Specimen Description BLOOD LEFT ARM  Final   Special Requests   Final    BOTTLES DRAWN AEROBIC ONLY Blood Culture adequate volume   Culture   Final    NO GROWTH < 12 HOURS Performed at [facility], [address] [place], [place] [address]    Report Status PENDING  Incomplete    Radiology Studies: CT Angio Abd/Pel W and/or Wo Contrast Result Date: 02/16/2024 EXAM: CTA ABDOMEN AND PELVIS WITHOUT AND WITH CONTRAST 02/15/2024 11:38:08 PM TECHNIQUE: CTA images of the abdomen and pelvis without and with intravenous contrast. 75 mL (iohexol  (OMNIPAQUE ) 350 MG/ML injection 75 mL IOHEXOL  350 MG/ML SOLN) was administered. Three-dimensional MIP/volume rendered formations were performed. Automated exposure control, iterative reconstruction, and/or weight based adjustment of the mA/kV was utilized to reduce the radiation dose to as low  as reasonably achievable. COMPARISON: CT abdomen and pelvis 02/10/1999 and 02/04/2024. CLINICAL HISTORY: upper GI bleed FINDINGS: VASCULATURE: AORTA: No acute finding. No abdominal aortic aneurysm. No dissection. CELIAC TRUNK: No acute finding. No occlusion or significant stenosis. SUPERIOR MESENTERIC ARTERY: No acute finding. No occlusion or significant stenosis. RENAL ARTERIES: No acute finding. No occlusion or significant stenosis. ILIAC ARTERIES: No acute finding. No occlusion or significant stenosis. *Additional vascular findings:* Portal vein is enlarged. Splenic varices are present. There is recanalization of the umbilical vein. LIVER: Nodular liver contour and diffuse hepatic heterogeneity is again seen compatible with cirrhosis. GALLBLADDER AND BILE DUCTS: There is gallbladder wall edema. Small gallstones are present. No biliary ductal dilatation. SPLEEN: The spleen is moderately enlarged and unchanged. PANCREAS: The pancreas is unremarkable. ADRENAL GLANDS: Bilateral adrenal glands demonstrate no acute abnormality. KIDNEYS, URETERS AND BLADDER: Previous subcentimeter cyst in the superior pole and inferior pole of the left kidney. No stones in the kidneys or ureters. No hydronephrosis. No perinephric or periureteral stranding. Urinary bladder is unremarkable. GI AND BOWEL: Stomach and duodenal sweep demonstrate no acute abnormality. There is diffuse colonic wall thickening/edema, more significant in the ascending colon. The appendix is normal. There is no active gastrointestinal bleeding identified. There is wall thickening and mesenteric edema involving jejunal loops. There is no bowel obstruction. No abnormal bowel distension. REPRODUCTIVE: Uterus is surgically absent. PERITONEUM AND RETROPERITONEUM: Moderate-to-large amount of ascites appears unchanged. No free air. LUNG BASE: There is atelectasis in the left lung base. LYMPH NODES: No lymphadenopathy. BONES AND SOFT TISSUES: No acute abnormality of the  bones. Diffuse body wall edema has increased. IMPRESSION: 1. No active gastrointestinal bleeding identified. 2. Cirrhosis with nodular liver contour, diffuse hepatic heterogeneity, portal vein enlargement, splenic varices, and recanalized umbilical vein, compatible with portal hypertension. 3. Moderate-to-large ascites, unchanged, with increased diffuse body wall edema. 4. Gallbladder wall edema with small gallstones, which may reflect cholecystitis versus third-spacing; correlate clinically. 5. Diffuse colonic wall thickening/edema, greatest in the ascending colon, and jejunal wall thickening with mesenteric edema, which may reflect portal hypertensive enterocolopathy and/or superimposed infectious/inflammatory enterocolitis; correlate  clinically. 6. Moderate splenomegaly, unchanged. Electronically signed by: Greig Pique MD 02/16/2024 12:06 AM EST RP Workstation: [id number]   IR Paracentesis Result Date: 02/15/2024 INDICATION: History of NASH cirrhosis with recurrent ascites. Request for therapeutic paracentesis. EXAM: ULTRASOUND GUIDED THERAPEUTIC PARACENTESIS MEDICATIONS: 10 mL 1% lidocaine  COMPLICATIONS: None immediate. PROCEDURE: Informed written consent was obtained from the patient after a discussion of the risks, benefits and alternatives to treatment. A timeout was performed prior to the initiation of the procedure. Initial ultrasound scanning demonstrates a moderate amount of ascites within the left lower abdominal quadrant. The left lower abdomen was prepped and draped in the usual sterile fashion. 1% lidocaine  was used for local anesthesia. Following this, a 19 gauge, 7-cm, Yueh catheter was introduced. An ultrasound image was saved for documentation purposes. The paracentesis was performed. The catheter was removed and a dressing was applied. The patient tolerated the procedure well without immediate post procedural complication. Patient received post-procedure intravenous albumin ; see nursing notes  for details. FINDINGS: A total of approximately 7.4 liters of slightly hazy yellow fluid was removed. IMPRESSION: Successful ultrasound-guided paracentesis yielding 7.4 liters liters of peritoneal fluid. Performed by: Wyatt Pommier, PA-C Electronically Signed   By: Cordella Banner   On: 02/15/2024 11:24   Scheduled Meds:  sodium chloride    Intravenous Once   insulin  aspart  0-9 Units Subcutaneous Q4H   lactulose   20 g Oral QID   midodrine   15 mg Oral TID with meals   pantoprazole  (PROTONIX ) IV  40 mg Intravenous Q12H   rifaximin   550 mg Oral BID   spironolactone   25 mg Oral Daily   Continuous Infusions:  cefTRIAXone  (ROCEPHIN )  IV Stopped (02/16/24 0927)   lactated ringers  75 mL/hr at 02/16/24 0552   potassium chloride        LOS: 0 days    Time spent: 50 Mins    Darcel Dawley, MD [facility]   If 7PM-7AM, please contact night-coverage

## 2024-02-16 NOTE — Op Note (Signed)
 [facility] Patient Name: Laurie Sutton Procedure Date : 02/16/2024 MRN: [id number] Attending MD: Gordy CHRISTELLA Starch , MD, [id number] Date of Birth: April 15, 1980 CSN: [id number] Age: 43 Admit Type: Inpatient Procedure:                Upper GI endoscopy Indications:              Hematemesis, Esophageal varices, known MASH                            cirrhosis with portal HTN Providers:                Gordy CHRISTELLA. Starch, MD, Ozell Pouch, Coye Bade, Technician Referring MD:             [facility] Medicines:                Monitored Anesthesia Care Complications:            No immediate complications. Estimated Blood Loss:     Estimated blood loss: none. Procedure:                Pre-Anesthesia Assessment:                           - Prior to the procedure, a History and Physical                            was performed, and patient medications and                            allergies were reviewed. The patient's tolerance of                            previous anesthesia was also reviewed. The risks                            and benefits of the procedure and the sedation                            options and risks were discussed with the patient.                            All questions were answered, and informed consent                            was obtained. Prior Anticoagulants: The patient has                            taken no anticoagulant or antiplatelet agents. ASA                            Grade Assessment: III - A patient with severe  systemic disease. After reviewing the risks and                            benefits, the patient was deemed in satisfactory                            condition to undergo the procedure.                           After obtaining informed consent, the endoscope was                            passed under direct vision. Throughout the                             procedure, the patient's blood pressure, pulse, and                            oxygen saturations were monitored continuously. The                            [id number]-[id number] ([id number]) Olympus endoscope was introduced                            through the mouth, and advanced to the second part                            of duodenum. The upper GI endoscopy was                            accomplished without difficulty. The patient                            tolerated the procedure well. Scope In: Scope Out: Findings:      Three columns of grade II varices with no bleeding and no stigmata of       recent bleeding were found in the lower third of the esophagus. They       were small in size. No red wale signs were present and no stigmata of       bleeding.      Moderate portal hypertensive gastropathy was found in the cardia, in the       gastric fundus and in the gastric body.      A single 4 mm sessile polyp with no bleeding, but with       erythema/inflammatory changes was found in the cardia.      Moderate inflammation characterized by nodularity was found in the       gastric antrum.      The examined duodenum was normal. Impression:               - Grade II esophageal varices with no bleeding and                            no stigmata of recent bleeding. Varices are small  in size.                           - Portal hypertensive gastropathy.                           - A single gastric cardia polyp with mild                            inflammatory change (benign appearing, most                            consistent with small hyperplastic polyp).                            Resection would be challenging given location for                            this low risk, small polyp.                           - Nodular antral gastritis without bleeding.                           - Normal examined duodenum.                           - No evidence of blood in the UGI  tract on this                            EGD. Cannot exclude recent bleeding from portal                            gastropathy or inflamed cardia polyp in setting of                            coagulopathy and low platelets.                           - No specimens collected. Moderate Sedation:      N/A Recommendation:           - Return patient to hospital ward for ongoing care.                           - Low sodium diet.                           - Continue present medications. BID PPI.                           - Can stop octreotide gtt.                           - Observe at least 24 hours before discharge.                           -  We will follow. Procedure Code(s):        --- Professional ---                           [phone], Esophagogastroduodenoscopy, flexible,                            transoral; diagnostic, including collection of                            specimen(s) by brushing or washing, when performed                            (separate procedure) Diagnosis Code(s):        --- Professional ---                           I85.00, Esophageal varices without bleeding                           K76.6, Portal hypertension                           K31.89, Other diseases of stomach and duodenum                           K31.7, Polyp of stomach and duodenum                           K29.70, Gastritis, unspecified, without bleeding                           K92.0, Hematemesis CPT copyright 2022 American [employer] Association. All rights reserved. The codes documented in this report are preliminary and upon coder review may  be revised to meet current compliance requirements. Gordy CHRISTELLA Starch, MD 02/16/2024 10:44:06 AM This report has been signed electronically. Number of Addenda: 0

## 2024-02-16 NOTE — ED Notes (Signed)
 Nt called CCMD@4 :56a

## 2024-02-16 NOTE — ED Notes (Signed)
 Pt otf with endo.

## 2024-02-16 NOTE — Transfer of Care (Signed)
 Immediate Anesthesia Transfer of Care Note  Patient: Laurie Sutton  Procedure(s) Performed: EGD (ESOPHAGOGASTRODUODENOSCOPY)  Patient Location: PACU  Anesthesia Type:MAC  Level of Consciousness: awake, alert , and oriented  Airway & Oxygen Therapy: Patient Spontanous Breathing and Patient connected to nasal cannula oxygen  Post-op Assessment: Report given to RN and Post -op Vital signs reviewed and stable  Post vital signs: Reviewed and stable  Last Vitals:  Vitals Value Taken Time  BP 107/57 02/16/24 12:05  Temp 36.3 C 02/16/24 10:25  Pulse 87 02/16/24 12:13  Resp 23 02/16/24 12:17  SpO2 100 % 02/16/24 12:13  Vitals shown include unfiled device data.  Last Pain:  Vitals:   02/16/24 1156  TempSrc:   PainSc: 7       Patients Stated Pain Goal: 0 (02/16/24 1100)  Complications: No notable events documented.

## 2024-02-16 NOTE — Anesthesia Preprocedure Evaluation (Addendum)
 Anesthesia Evaluation  Patient identified by MRN, date of birth, ID band Patient awake    Reviewed: Allergy & Precautions, NPO status , Patient's Chart, lab work & pertinent test results  History of Anesthesia Complications Negative for: history of anesthetic complications  Airway Mallampati: II  TM Distance: >3 FB Neck ROM: Full    Dental  (+) Missing   Pulmonary neg pulmonary ROS   Pulmonary exam normal        Cardiovascular negative cardio ROS Normal cardiovascular exam   '25 TTE - EF 60 to 65%. Grade II diastolic dysfunction (pseudonormalization). Left atrial size was moderately dilated. Trivial mitral valve regurgitation.     Neuro/Psych  Headaches PSYCHIATRIC DISORDERS Anxiety Depression     Neuromuscular disease    GI/Hepatic ,GERD  Medicated and Controlled,,(+) Cirrhosis         Endo/Other  diabetes, Type 2, Insulin  Dependent   K 3.0   Renal/GU negative Renal ROS     Musculoskeletal negative musculoskeletal ROS (+)    Abdominal   Peds  (+) ADHD Hematology  (+) Blood dyscrasia, anemia  Pancytopenia Plt 62k, WBC 1.9k, Hct 24 INR 1.9    Anesthesia Other Findings   Reproductive/Obstetrics                              Anesthesia Physical Anesthesia Plan  ASA: 3  Anesthesia Plan: MAC   Post-op Pain Management:    Induction:   PONV Risk Score and Plan: 2 and Propofol  infusion and Treatment may vary due to age or medical condition  Airway Management Planned: Nasal Cannula and Natural Airway  Additional Equipment: None  Intra-op Plan:   Post-operative Plan:   Informed Consent: I have reviewed the patients History and Physical, chart, labs and discussed the procedure including the risks, benefits and alternatives for the proposed anesthesia with the patient or authorized representative who has indicated his/her understanding and acceptance.       Plan Discussed  with: CRNA and Anesthesiologist  Anesthesia Plan Comments:          Anesthesia Quick Evaluation

## 2024-02-16 NOTE — H&P (Signed)
 History and Physical    Patient: Laurie Sutton FMW:[id number] DOB: 1980/10/27 DOA: 02/15/2024 DOS: the patient was seen and examined on 02/16/2024 PCP: [name], MD  Patient coming from: Home  Chief Complaint:  Chief Complaint  Patient presents with   Hematemesis   HPI: Laurie Sutton is a 43 y.o. female with medical history significant of [name] with esophageal varices requiring weekly paracentesis, presents to the emergency department for evaluation of 4 episodes of hematemesis yesterday evening.  She denies nausea, abdominal pain, bloody or melanotic stools. She had paracentesis earlier in the day which yielded 7.4 L.  Her husband notes she was somewhat confused.  She is also currently being treated for a UTI.  In the emergency department, she is hemodynamically stable.  Hemoglobin 9.9.  She has not had any further episodes of vomiting.  CT angio abdomen pelvis wihtout  active gastrointestinal bleeding identified.  Hospitalist consulted for admission for GI consultation in the morning.   Review of Systems: Review of Systems  Constitutional:  Positive for malaise/fatigue. Negative for chills, fever and weight loss.  Respiratory:  Negative for cough and shortness of breath.   Cardiovascular:  Negative for chest pain.  Gastrointestinal:  Positive for abdominal pain and vomiting. Negative for blood in stool, constipation, diarrhea and nausea.  Genitourinary:  Positive for frequency. Negative for dysuria.  Skin:  Negative for itching and rash.  Neurological:  Negative for dizziness, tingling, tremors and headaches.  Psychiatric/Behavioral:  Negative for substance abuse.     Past Medical History:  Diagnosis Date   ADHD    Allergy    Anemia    IRON TRANSFUSION 07-2020 NONE SINCE   Anxiety    Back pain    COVID 08/12/2019   ALL SYMPTOMS REOLVED PER PT   Depression    Diabetic neuropathy (HCC) 11/11/2020   FEET   dm type 2    sees Dr. Odella Jacobson at [facility]  Endocrinology   Fatty liver    GERD (gastroesophageal reflux disease)    History of kidney stones    Joint pain    Menorrhagia 11/11/2020   Migraines    Murmur, cardiac    FAINT NO CARDIOLOGIST   Other fatigue    Pneumonia 08/12/2019   COVID PNEUMONIA ALL SYMPTOMS RESOLVED PER PT   Shortness of breath on exertion    Past Surgical History:  Procedure Laterality Date   CESAREAN SECTION  12/13/2006   COLONOSCOPY WITH PROPOFOL  N/A 08/02/2023   Procedure: COLONOSCOPY WITH PROPOFOL ;  Surgeon: Legrand Victory LITTIE DOUGLAS, MD;  Location: [facility];  Service: Gastroenterology;  Laterality: N/A;   ESOPHAGOGASTRODUODENOSCOPY (EGD) WITH PROPOFOL  N/A 08/02/2023   Procedure: ESOPHAGOGASTRODUODENOSCOPY (EGD) WITH PROPOFOL ;  Surgeon: Legrand Victory LITTIE DOUGLAS, MD;  Location: [facility];  Service: Gastroenterology;  Laterality: N/A;   EXTRACORPOREAL SHOCK WAVE LITHOTRIPSY  2018   FLEXIBLE SIGMOIDOSCOPY N/A 11/20/2023   Procedure: KINGSTON SIDE;  Surgeon: Albertus Gordy HERO, MD;  Location: [facility];  Service: Gastroenterology;  Laterality: N/A;   FOOT SURGERY Right 10/02/2020   RIGHT FOOT HEEL AND TOES, SUR[facility] OFF [address]   IR PARACENTESIS  07/06/2023   IR PARACENTESIS  08/18/2023   IR PARACENTESIS  10/05/2023   IR PARACENTESIS  10/11/2023   IR PARACENTESIS  10/20/2023   IR PARACENTESIS  11/08/2023   IR PARACENTESIS  11/28/2023   IR PARACENTESIS  12/09/2023   IR PARACENTESIS  12/15/2023   IR PARACENTESIS  12/16/2023   IR PARACENTESIS  12/23/2023   IR PARACENTESIS  12/30/2023   IR PARACENTESIS  01/06/2024   IR PARACENTESIS  01/06/2024   IR PARACENTESIS  01/10/2024   IR PARACENTESIS  01/13/2024   IR PARACENTESIS  01/20/2024   IR PARACENTESIS  01/27/2024   IR PARACENTESIS  02/01/2024   IR PARACENTESIS  02/03/2024   IR PARACENTESIS  02/08/2024   IR PARACENTESIS  02/15/2024   IR TRANSCATHETER BX  07/26/2023   IR US  GUIDE VASC ACCESS RIGHT  07/26/2023   IR VENOGRAM HEPATIC W HEMODYNAMIC EVALUATION   07/26/2023   LAPAROSCOPIC VAGINAL HYSTERECTOMY WITH SALPINGECTOMY Bilateral 11/17/2020   Procedure: LAPAROSCOPIC ASSISTED VAGINAL HYSTERECTOMY WITH BILATERAL SALPINGECTOMY;  Surgeon: Dannielle Bouchard, DO;  Location: [facility];  Service: Gynecology;  Laterality: Bilateral;   POLYPECTOMY  08/02/2023   Procedure: POLYPECTOMY, INTESTINE;  Surgeon: Legrand Victory LITTIE DOUGLAS, MD;  Location: [facility];  Service: Gastroenterology;;   Social History:  reports that she has never smoked. She has been exposed to tobacco smoke. She has never used smokeless tobacco. She reports that she does not drink alcohol and does not use drugs.  Allergies  Allergen Reactions   Vancomycin  Itching   Amoxicillin Hives and Itching   Azithromycin      DOES NOT WORK   Dulaglutide  Other (See Comments)    constipation and stomach issues  **Trulicity **    Empagliflozin -Metformin  Hcl Er Other (See Comments) and Swelling   Januvia  [Sitagliptin ] Other (See Comments)    HURTS STOAMCH   Latex     IRRITATES VAGINAL AREA   Metformin      Other Reaction(s): achy all over   Morphine  Itching    Severe    Penicillins Hives    Has patient had a PCN reaction causing immediate rash, facial/tongue/throat swelling, SOB or lightheadedness with hypotension: yes. Rash  Has patient had a PCN reaction causing severe rash involving mucus membranes or skin necrosis: Yes- rash and hives all over body  Has patient had a PCN reaction that required hospitalization No Has patient had a PCN reaction occurring within the last 10 years: Yes  If all of the above answers are NO, then may proceed with Cephalosporin use.    Vitamin K  And Related Other (See Comments)    Electric shock     Family History  Problem Relation Age of Onset   Colon polyps Mother    Depression Mother    Anxiety disorder Mother    Colon polyps Father    Sleep apnea Father    Anxiety disorder Father    Depression Father    Diabetes Father    Bladder Cancer  Father 80   Rheum arthritis Father    Migraines Maternal Aunt    Migraines Maternal Grandmother    Diabetes Maternal Grandfather    Healthy Daughter    Asthma Daughter    Anxiety disorder Daughter    Anxiety disorder Son    Asthma Son    Healthy Son    Cerebral aneurysm Cousin    Heart disease Other    Depression Other    Anxiety disorder Other    Sleep apnea Other    Colon cancer Neg Hx    Esophageal cancer Neg Hx    Stomach cancer Neg Hx    Rectal cancer Neg Hx     Prior to Admission medications   Medication Sig Start Date End Date Taking? Authorizing Provider  Continuous Glucose Sensor (DEXCOM G7 SENSOR) MISC Use 1 sensor for continuous glucose monitoring every 10  days for 30 days 05/25/23   Braulio Hough, MD  FLUoxetine  (PROZAC ) 40 MG capsule Take 1 capsule (40 mg total) by mouth 2 (two) times daily. 01/03/24   [name], MD  insulin  regular human CONCENTRATED (HUMULIN  R) 500 UNIT/ML injection Inject into the skin.    [provider]  lactulose  (CHRONULAC ) 10 GM/15ML solution Take 30 mLs (20 g total) by mouth 4 (four) times daily. Patient taking differently: Take 20 g by mouth 2 (two) times daily. For 10 days per discharge instructions from [facility] on 01/19/2024 12/19/23 03/18/24  Arlice Reichert, MD  midodrine  (PROAMATINE ) 5 MG tablet Take 3 tablets (15 mg total) by mouth with breakfast, with lunch, and with evening meal. 02/03/24   Samtani, Jai-Gurmukh, MD  montelukast  (SINGULAIR ) 10 MG tablet Take 1 tablet (10 mg total) by mouth at bedtime. 08/25/22   [name], MD  nitrofurantoin , macrocrystal-monohydrate, (MACROBID ) 100 MG capsule Take 1 capsule (100 mg total) by mouth 2 (two) times daily. 02/13/24   [name], MD  omeprazole  (PRILOSEC) 40 MG capsule TAKE 1 CAPSULE (40 MG TOTAL) BY MOUTH IN THE MORNING 01/27/24   Legrand Victory LITTIE DOUGLAS, MD  ondansetron  (ZOFRAN -ODT) 4 MG disintegrating tablet Take 1 tablet (4 mg total) by mouth every 8 (eight) hours as needed  for nausea or vomiting. 01/06/24   Garrick Charleston, MD  oxyCODONE  (OXY IR/ROXICODONE ) 5 MG immediate release tablet Take 1 tablet (5 mg total) by mouth every 6 (six) hours as needed for moderate pain (pain score 4-6). 02/03/24   Samtani, Jai-Gurmukh, MD  rOPINIRole  (REQUIP ) 1 MG tablet Take 1 tablet (1 mg total) by mouth at bedtime. 01/03/24   [name], MD  spironolactone  (ALDACTONE ) 25 MG tablet Take 1 tablet (25 mg total) by mouth daily. 02/03/24 02/02/25  Samtani, Jai-Gurmukh, MD  temazepam  (RESTORIL ) 30 MG capsule Take 1 capsule (30 mg total) by mouth at bedtime as needed for sleep. 02/13/24   [name], MD  XIFAXAN  550 MG TABS tablet Take 550 mg by mouth 2 (two) times daily. 10/27/23   [provider]    Physical Exam: Vitals:   02/16/24 0200 02/16/24 0207 02/16/24 0230 02/16/24 0330  BP: (!) 103/50  (!) 104/58 (!) 107/56  Pulse: 86  92 89  Resp: 18  17 18   Temp:  98.4 F (36.9 C)    TempSrc:  Oral    SpO2: 99%  97% 95%  Weight:      Height:       Physical Exam Vitals and nursing note reviewed.  Constitutional:      General: She is not in acute distress.    Appearance: She is ill-appearing (chronically). She is not toxic-appearing.  HENT:     Head: Normocephalic and atraumatic.     Mouth/Throat:     Mouth: Mucous membranes are moist.     Pharynx: Oropharynx is clear.  Eyes:     Extraocular Movements: Extraocular movements intact.     Pupils: Pupils are equal, round, and reactive to light.  Cardiovascular:     Rate and Rhythm: Normal rate and regular rhythm.  Abdominal:     General: Bowel sounds are normal.     Palpations: Abdomen is soft.     Tenderness: There is no abdominal tenderness. There is no guarding.  Musculoskeletal:        General: Normal range of motion.     Right lower leg: No edema.     Left lower  leg: No edema.  Skin:    Capillary Refill: Capillary refill takes less than 2 seconds.     Coloration: Skin is pale.  Neurological:      General: No focal deficit present.     Mental Status: She is alert.     Sensory: No sensory deficit.     Comments: Delayed cognition      Data Reviewed:   Labs on Admission: I have personally reviewed following labs and imaging studies  CBC: Recent Labs  Lab 02/10/24 0015 02/15/24 2152 02/15/24 2215  WBC 2.1* 1.9*  --   HGB 8.4* 8.2* 9.9*  HCT 26.5* 26.3* 29.0*  MCV 88.6 90.4  --   PLT 61* 66*  --    Basic Metabolic Panel: Recent Labs  Lab 02/10/24 0015 02/10/24 0118 02/15/24 2152 02/15/24 2215  NA 128*  --  132* 136  K 3.6  --  3.4* 3.4*  CL 94*  --  98 97*  CO2 22  --  23  --   GLUCOSE 372*  --  286* 281*  BUN 5*  --  6 5*  CREATININE 0.90  --  0.94 0.60  CALCIUM  8.3*  --  8.8*  --   MG  --  1.8  --   --    GFR: Estimated Creatinine Clearance: 98.8 mL/min (by C-G formula based on SCr of 0.6 mg/dL). Liver Function Tests: Recent Labs  Lab 02/10/24 0015 02/15/24 2152  AST 34 50*  ALT 25 26  ALKPHOS 100 110  BILITOT 4.3* 4.6*  PROT 6.6 7.0  ALBUMIN  3.4* 3.7   Recent Labs  Lab 02/10/24 0015 02/15/24 2152  LIPASE 54* 51   Recent Labs  Lab 02/15/24 2152  AMMONIA 92*   Coagulation Profile: Recent Labs  Lab 02/10/24 0118 02/15/24 2152  INR 1.8* 1.8*   Cardiac Enzymes: No results for input(s): CKTOTAL, CKMB, CKMBINDEX, TROPONINI in the last 168 hours. BNP (last 3 results) No results for input(s): PROBNP in the last 8760 hours. HbA1C: No results for input(s): HGBA1C in the last 72 hours. CBG: No results for input(s): GLUCAP in the last 168 hours. Lipid Profile: No results for input(s): CHOL, HDL, LDLCALC, TRIG, CHOLHDL, LDLDIRECT in the last 72 hours. Thyroid  Function Tests: No results for input(s): TSH, T4TOTAL, FREET4, T3FREE, THYROIDAB in the last 72 hours. Anemia Panel: No results for input(s): VITAMINB12, FOLATE, FERRITIN, TIBC, IRON, RETICCTPCT in the last 72 hours. Urine analysis:     Component Value Date/Time   COLORURINE AMBER (A) 02/10/2024 0021   APPEARANCEUR CLEAR 02/10/2024 0021   LABSPEC 1.020 02/10/2024 0021   PHURINE 5.0 02/10/2024 0021   GLUCOSEU >=500 (A) 02/10/2024 0021   HGBUR NEGATIVE 02/10/2024 0021   BILIRUBINUR 1+ 02/13/2024 1151   KETONESUR NEGATIVE 02/10/2024 0021   PROTEINUR Positive (A) 02/13/2024 1151   PROTEINUR NEGATIVE 02/10/2024 0021   UROBILINOGEN 1.0 02/13/2024 1151   UROBILINOGEN 1.0 12/21/2014 1020   NITRITE neg 02/13/2024 1151   NITRITE NEGATIVE 02/10/2024 0021   LEUKOCYTESUR Small (1+) (A) 02/13/2024 1151   LEUKOCYTESUR NEGATIVE 02/10/2024 0021    Radiological Exams on Admission: CT Angio Abd/Pel W and/or Wo Contrast Result Date: 02/16/2024 EXAM: CTA ABDOMEN AND PELVIS WITHOUT AND WITH CONTRAST 02/15/2024 11:38:08 PM TECHNIQUE: CTA images of the abdomen and pelvis without and with intravenous contrast. 75 mL (iohexol  (OMNIPAQUE ) 350 MG/ML injection 75 mL IOHEXOL  350 MG/ML SOLN) was administered. Three-dimensional MIP/volume rendered formations were performed. Automated exposure control, iterative reconstruction, and/or weight based  adjustment of the mA/kV was utilized to reduce the radiation dose to as low as reasonably achievable. COMPARISON: CT abdomen and pelvis 02/10/1999 and 02/04/2024. CLINICAL HISTORY: upper GI bleed FINDINGS: VASCULATURE: AORTA: No acute finding. No abdominal aortic aneurysm. No dissection. CELIAC TRUNK: No acute finding. No occlusion or significant stenosis. SUPERIOR MESENTERIC ARTERY: No acute finding. No occlusion or significant stenosis. RENAL ARTERIES: No acute finding. No occlusion or significant stenosis. ILIAC ARTERIES: No acute finding. No occlusion or significant stenosis. *Additional vascular findings:* Portal vein is enlarged. Splenic varices are present. There is recanalization of the umbilical vein. LIVER: Nodular liver contour and diffuse hepatic heterogeneity is again seen compatible with cirrhosis.  GALLBLADDER AND BILE DUCTS: There is gallbladder wall edema. Small gallstones are present. No biliary ductal dilatation. SPLEEN: The spleen is moderately enlarged and unchanged. PANCREAS: The pancreas is unremarkable. ADRENAL GLANDS: Bilateral adrenal glands demonstrate no acute abnormality. KIDNEYS, URETERS AND BLADDER: Previous subcentimeter cyst in the superior pole and inferior pole of the left kidney. No stones in the kidneys or ureters. No hydronephrosis. No perinephric or periureteral stranding. Urinary bladder is unremarkable. GI AND BOWEL: Stomach and duodenal sweep demonstrate no acute abnormality. There is diffuse colonic wall thickening/edema, more significant in the ascending colon. The appendix is normal. There is no active gastrointestinal bleeding identified. There is wall thickening and mesenteric edema involving jejunal loops. There is no bowel obstruction. No abnormal bowel distension. REPRODUCTIVE: Uterus is surgically absent. PERITONEUM AND RETROPERITONEUM: Moderate-to-large amount of ascites appears unchanged. No free air. LUNG BASE: There is atelectasis in the left lung base. LYMPH NODES: No lymphadenopathy. BONES AND SOFT TISSUES: No acute abnormality of the bones. Diffuse body wall edema has increased. IMPRESSION: 1. No active gastrointestinal bleeding identified. 2. Cirrhosis with nodular liver contour, diffuse hepatic heterogeneity, portal vein enlargement, splenic varices, and recanalized umbilical vein, compatible with portal hypertension. 3. Moderate-to-large ascites, unchanged, with increased diffuse body wall edema. 4. Gallbladder wall edema with small gallstones, which may reflect cholecystitis versus third-spacing; correlate clinically. 5. Diffuse colonic wall thickening/edema, greatest in the ascending colon, and jejunal wall thickening with mesenteric edema, which may reflect portal hypertensive enterocolopathy and/or superimposed infectious/inflammatory enterocolitis; correlate  clinically. 6. Moderate splenomegaly, unchanged. Electronically signed by: Greig Pique MD 02/16/2024 12:06 AM EST RP Workstation: [id number]   IR Paracentesis Result Date: 02/15/2024 INDICATION: History of NASH cirrhosis with recurrent ascites. Request for therapeutic paracentesis. EXAM: ULTRASOUND GUIDED THERAPEUTIC PARACENTESIS MEDICATIONS: 10 mL 1% lidocaine  COMPLICATIONS: None immediate. PROCEDURE: Informed written consent was obtained from the patient after a discussion of the risks, benefits and alternatives to treatment. A timeout was performed prior to the initiation of the procedure. Initial ultrasound scanning demonstrates a moderate amount of ascites within the left lower abdominal quadrant. The left lower abdomen was prepped and draped in the usual sterile fashion. 1% lidocaine  was used for local anesthesia. Following this, a 19 gauge, 7-cm, Yueh catheter was introduced. An ultrasound image was saved for documentation purposes. The paracentesis was performed. The catheter was removed and a dressing was applied. The patient tolerated the procedure well without immediate post procedural complication. Patient received post-procedure intravenous albumin ; see nursing notes for details. FINDINGS: A total of approximately 7.4 liters of slightly hazy yellow fluid was removed. IMPRESSION: Successful ultrasound-guided paracentesis yielding 7.4 liters liters of peritoneal fluid. Performed by: Kimble Clas, PA-C Electronically Signed   By: Cordella Banner   On: 02/15/2024 11:24       Assessment and Plan:  Hematemesis - Hemoglobin 9.9.  Will trend - Empiric ceftriaxone  - Octreotide - IV Protonix  BID  - GI consult pending, appreciate recommendations - NPO  - gentle IVF   NASH cirrhosis complicated by recurrent ascites requiring weekly paracentesis - 7.5 L removed earlier today - Continue rifaximin , lactulose   - spironolactone    UTI - Currently on Macrobid  outpatient, will hold this while  on Rocephin     SCDs Monitor/replace electrolytes  LR @75  NPO  Advance Care Planning:   Code Status: Full Code discussed with patient at time of admission  Consults: GI   Family Communication: husband at bedside   Severity of Illness: The appropriate patient status for this patient is INPATIENT. Inpatient status is judged to be reasonable and necessary in order to provide the required intensity of service to ensure the patient's safety. The patient's presenting symptoms, physical exam findings, and initial radiographic and laboratory data in the context of their chronic comorbidities is felt to place them at high risk for further clinical deterioration. Furthermore, it is not anticipated that the patient will be medically stable for discharge from the hospital within 2 midnights of admission.   * I certify that at the point of admission it is my clinical judgment that the patient will require inpatient hospital care spanning beyond 2 midnights from the point of admission due to high intensity of service, high risk for further deterioration and high frequency of surveillance required.*  Author: Daved JAYSON Pump, DO 02/16/2024 4:26 AM  For on call review [email or web address].

## 2024-02-17 DIAGNOSIS — K920 Hematemesis: Secondary | ICD-10-CM | POA: Diagnosis not present

## 2024-02-17 LAB — COMPREHENSIVE METABOLIC PANEL WITH GFR
ALT: 26 U/L (ref 0–44)
AST: 55 U/L — ABNORMAL HIGH (ref 15–41)
Albumin: 3 g/dL — ABNORMAL LOW (ref 3.5–5.0)
Alkaline Phosphatase: 78 U/L (ref 38–126)
Anion gap: 11 (ref 5–15)
BUN: 6 mg/dL (ref 6–20)
CO2: 21 mmol/L — ABNORMAL LOW (ref 22–32)
Calcium: 8.2 mg/dL — ABNORMAL LOW (ref 8.9–10.3)
Chloride: 99 mmol/L (ref 98–111)
Creatinine, Ser: 0.65 mg/dL (ref 0.44–1.00)
GFR, Estimated: >60 mL/min (ref >60)
Glucose, Bld: 231 mg/dL — ABNORMAL HIGH (ref 70–99)
Potassium: 4.1 mmol/L (ref 3.5–5.1)
Sodium: 131 mmol/L — ABNORMAL LOW (ref 135–145)
Total Bilirubin: 4 mg/dL — ABNORMAL HIGH (ref 0.0–1.2)
Total Protein: 6.1 g/dL — ABNORMAL LOW (ref 6.5–8.1)

## 2024-02-17 LAB — HEMOGLOBIN AND HEMATOCRIT, BLOOD
HCT: 26.3 % — ABNORMAL LOW (ref 36.0–46.0)
Hemoglobin: 8.4 g/dL — ABNORMAL LOW (ref 12.0–15.0)

## 2024-02-17 LAB — CBC
HCT: 27.7 % — ABNORMAL LOW (ref 36.0–46.0)
Hemoglobin: 8.8 g/dL — ABNORMAL LOW (ref 12.0–15.0)
MCH: 28 pg (ref 26.0–34.0)
MCHC: 31.8 g/dL (ref 30.0–36.0)
MCV: 88.2 fL (ref 80.0–100.0)
Platelets: 67 K/uL — ABNORMAL LOW (ref 150–400)
RBC: 3.14 MIL/uL — ABNORMAL LOW (ref 3.87–5.11)
RDW: 17.3 % — ABNORMAL HIGH (ref 11.5–15.5)
WBC: 2.1 K/uL — ABNORMAL LOW (ref 4.0–10.5)
nRBC: 0 % (ref 0.0–0.2)

## 2024-02-17 LAB — PROTIME-INR
INR: 1.9 — ABNORMAL HIGH (ref 0.8–1.2)
Prothrombin Time: 23 s — ABNORMAL HIGH (ref 11.4–15.2)

## 2024-02-17 LAB — GLUCOSE, CAPILLARY
Glucose-Capillary: 210 mg/dL — ABNORMAL HIGH (ref 70–99)
Glucose-Capillary: 232 mg/dL — ABNORMAL HIGH (ref 70–99)

## 2024-02-17 LAB — MAGNESIUM: Magnesium: 1.7 mg/dL (ref 1.7–2.4)

## 2024-02-17 LAB — PHOSPHORUS: Phosphorus: 3 mg/dL (ref 2.5–4.6)

## 2024-02-17 MED ORDER — METHOCARBAMOL 1000 MG/10ML IJ SOLN
500.0000 mg | Freq: Once | INTRAMUSCULAR | Status: AC
  Administered 2024-02-17: 500 mg via INTRAVENOUS
  Filled 2024-02-17: qty 10

## 2024-02-17 MED ORDER — FENTANYL CITRATE (PF) 50 MCG/ML IJ SOSY
25.0000 ug | PREFILLED_SYRINGE | INTRAMUSCULAR | Status: DC | PRN
Start: 2024-02-17 — End: 2024-02-17
  Administered 2024-02-17: 25 ug via INTRAVENOUS
  Filled 2024-02-17: qty 1

## 2024-02-17 MED ORDER — FENTANYL CITRATE (PF) 50 MCG/ML IJ SOSY
25.0000 ug | PREFILLED_SYRINGE | INTRAMUSCULAR | Status: DC | PRN
Start: 2024-02-17 — End: 2024-02-17

## 2024-02-17 MED ORDER — PANTOPRAZOLE SODIUM 40 MG PO TBEC: 40.0000 mg | DELAYED_RELEASE_TABLET | Freq: Two times a day (BID) | ORAL | 1 refills | Status: DC

## 2024-02-17 NOTE — Progress Notes (Signed)
 Meld 23 updated unos

## 2024-02-17 NOTE — TOC Transition Note (Incomplete)
 Transition of Care [facility]) - Discharge Note   Patient Details  Name: Laurie Sutton MRN: [id number] Date of Birth: 08-20-1980  [facility] CM/SW Contact:  Roxie KANDICE Stain, RN Phone Number: 02/17/2024, 11:13 AM   Clinical Narrative:       Final next level of care: Home/Self Care Barriers to Discharge: Barriers Resolved   Patient Goals and CMS Choice Patient states their goals for this hospitalization and ongoing recovery are:: return home          Discharge Placement                       Discharge Plan and Services Additional resources added to the After Visit Summary for                                       Social Drivers of Health (SDOH) Interventions SDOH Screenings   Food Insecurity: No Food Insecurity (02/16/2024)  Housing: Low Risk  (02/16/2024)  Transportation Needs: No Transportation Needs (02/16/2024)  Utilities: Not At Risk (02/16/2024)  Depression (PHQ2-9): Low Risk  (02/06/2024)  Financial Resource Strain: Low Risk  (09/19/2022)  Physical Activity: Insufficiently Active (06/04/2021)  Social Connections: Socially Integrated (10/05/2023)  Stress: Stress Concern Present (09/19/2022)  Tobacco Use: Medium Risk (02/16/2024)     Readmission Risk Interventions    02/03/2024   11:22 AM 10/11/2023    4:23 PM 10/06/2023    3:09 PM  Readmission Risk Prevention Plan  Post Dischage Appt   Complete  Medication Screening   Complete  Transportation Screening Complete Complete Complete  PCP or Specialist Appt within 5-7 Days  Complete   Home Care Screening  Complete   Medication Review (RN CM)  Complete   Medication Review (RN Care Manager) Referral to Pharmacy    PCP or Specialist appointment within 3-5 days of discharge Complete    HRI or Home Care Consult Complete    SW Recovery Care/Counseling Consult Complete    Palliative Care Screening Not Applicable    Skilled Nursing Facility Not Applicable

## 2024-02-17 NOTE — Discharge Instructions (Signed)
 Advised to follow-up with primary care physician in 1 week. Advised to follow-up with gastroenterology as scheduled. Advised to take pantoprazole  40 mg twice daily. Advised to continue with weekly paracentesis is scheduled.

## 2024-02-17 NOTE — Discharge Summary (Signed)
 Physician Discharge Summary  Laurie Sutton FMW:[id number] DOB: [birth year] DOA: 02/15/2024  PCP: [name], MD  Admit date: 02/15/2024  Discharge date: 02/17/2024  Admitted From: Home.  Disposition:  Home  Recommendations for Outpatient Follow-up:  Follow up with PCP in 1-2 weeks. Please obtain BMP/CBC in one week. Advised to follow-up with gastroenterology as scheduled. Advised to take pantoprazole  40 mg twice daily. Advised to continue with weekly paracentesis as scheduled.  Home Health:None Equipment/Devices:None  Discharge Condition: Stable CODE STATUS:Full code Diet recommendation: Carb Modified.  Brief Summary / Hospital Course: This 43 yrs old female with PMH significant for NASH cirrhosis with esophageal varices requiring weekly paracentesis, presents to the emergency department for evaluation of 4 episodes of hematemesis started yesterday evening.  She denies nausea, abdominal pain, bloody or melanotic stools. She had paracentesis earlier in the day yesterday which yielded 7.4 L.  Her husband notes She was somewhat confused afterwards.  She is also currently being treated for a UTI.  She was hemodynamically stable in the ED.  Hemoglobin 9.9.  Patient has not had any further episodes of vomiting.  CTA abdominal without active GI bleeding.  Patient was admitted for further evaluation. GI was consulted.  Patient was continued on octreotide infusion.  Patient underwent EGD found to have no active bleeding.  EGD shows grade 2 esophageal varices with no stigmata of bleeding.  H&H remains stable.  GI signed off.  Patient feels much better and wants to be discharged home.    Discharge Diagnoses:  Principal Problem:   Hematemesis  Hematemesis: Patient presented with 4 episodes of hematemesis yesterday. Hemodynamically stable.  CT abdomen negative for acute GI bleeding. Hemoglobin 9.9 dropped to 7.5 today. Continue to monitor H&H every 6 hours.   Transfuse if hemoglobin  drops below 7. Continue empiric ceftriaxone . Patient was continued on IV octreotide infusion. Continue IV Protonix  BID  Continue IV fluid resuscitation. GI consulted patient underwent EGD found to have no active bleeding. EGD shows grade 2 esophageal varices with no stigmata of bleeding. Octreotide infusion discontinued,  ceftriaxone  discontinued. H&H relatively stable.  Patient feels better and being discharged home.   NASH cirrhosis complicated by recurrent ascites requiring weekly paracentesis: Patient had paracentesis yesterday , 7.5 L clear fluid drained. Continue lactulose  and rifaximin . Continue spironolactone     UTI: Currently on Macrobid  outpatient, will hold this while on Rocephin . Resume when patient ready for discharge.  Discharge Instructions  Discharge Instructions     Call MD for:  difficulty breathing, headache or visual disturbances   Complete by: As directed    Call MD for:  persistant nausea and vomiting   Complete by: As directed    Diet - low sodium heart healthy   Complete by: As directed    Diet Carb Modified   Complete by: As directed    Discharge instructions   Complete by: As directed    Advised to follow-up with primary care physician in 1 week. Advised to follow-up with gastroenterology as scheduled. Advised to take pantoprazole  40 mg twice daily. Advised to continue with weekly paracentesis is scheduled.   Increase activity slowly   Complete by: As directed       Allergies as of 02/17/2024       Reactions   Vancomycin  Itching   Amoxicillin Hives, Itching   Azithromycin     DOES NOT WORK   Dulaglutide  Other (See Comments)   constipation and stomach issues  **Trulicity **   Empagliflozin -metformin  Hcl Er Other (See Comments), Swelling  Januvia  [sitagliptin ] Other (See Comments)   HURTS STOAMCH   Latex    IRRITATES VAGINAL AREA   Metformin     Other Reaction(s): achy all over   Morphine  Itching   Severe    Penicillins Hives   Has  patient had a PCN reaction causing immediate rash, facial/tongue/throat swelling, SOB or lightheadedness with hypotension: yes. Rash  Has patient had a PCN reaction causing severe rash involving mucus membranes or skin necrosis: Yes- rash and hives all over body  Has patient had a PCN reaction that required hospitalization No Has patient had a PCN reaction occurring within the last 10 years: Yes  If all of the above answers are NO, then may proceed with Cephalosporin use.   Vitamin K  And Related Other (See Comments)   Electric shock         Medication List     STOP taking these medications    omeprazole  40 MG capsule Commonly known as: PRILOSEC       TAKE these medications    baclofen 10 MG tablet Commonly known as: LIORESAL Take 10 mg by mouth once a week. After paracentesis   Constulose  10 GM/15ML solution Generic drug: lactulose  Take 30 mLs (20 g total) by mouth 4 (four) times daily. What changed: when to take this   Dexcom G7 Sensor Misc Use 1 sensor for continuous glucose monitoring every 10 days for 30 days   FLUoxetine  40 MG capsule Commonly known as: PROZAC  Take 1 capsule (40 mg total) by mouth 2 (two) times daily.   HUMULIN  R 500 UNIT/ML injection Generic drug: insulin  regular human CONCENTRATED Inject 65 Units into the skin 3 (three) times daily with meals.   midodrine  5 MG tablet Commonly known as: PROAMATINE  Take 3 tablets (15 mg total) by mouth with breakfast, with lunch, and with evening meal. What changed:  how much to take when to take this   montelukast  10 MG tablet Commonly known as: SINGULAIR  Take 1 tablet (10 mg total) by mouth at bedtime.   nitrofurantoin  (macrocrystal-monohydrate) 100 MG capsule Commonly known as: Macrobid  Take 1 capsule (100 mg total) by mouth 2 (two) times daily.   ondansetron  4 MG disintegrating tablet Commonly known as: ZOFRAN -ODT Take 1 tablet (4 mg total) by mouth every 8 (eight) hours as needed for nausea or  vomiting.   oxyCODONE  5 MG immediate release tablet Commonly known as: Oxy IR/ROXICODONE  Take 1 tablet (5 mg total) by mouth every 6 (six) hours as needed for moderate pain (pain score 4-6).   pantoprazole  40 MG tablet Commonly known as: Protonix  Take 1 tablet (40 mg total) by mouth 2 (two) times daily.   rOPINIRole  1 MG tablet Commonly known as: REQUIP  Take 1 tablet (1 mg total) by mouth at bedtime.   spironolactone  25 MG tablet Commonly known as: Aldactone  Take 1 tablet (25 mg total) by mouth daily.   temazepam  30 MG capsule Commonly known as: RESTORIL  Take 1 capsule (30 mg total) by mouth at bedtime as needed for sleep.   Xifaxan  550 MG Tabs tablet Generic drug: rifaximin  Take 550 mg by mouth 2 (two) times daily.        Follow-up Information     [name], MD Follow up in 1 week(s).   Specialty: Family Medicine Contact information: [address] [place] [address] [phone]         Albertus Gordy HERO, MD Follow up in 1 week(s).   Specialty: Gastroenterology Contact information: [address]  [place] [address] [phone]                Allergies  Allergen Reactions   Vancomycin  Itching   Amoxicillin Hives and Itching   Azithromycin      DOES NOT WORK   Dulaglutide  Other (See Comments)    constipation and stomach issues  **Trulicity **    Empagliflozin -Metformin  Hcl Er Other (See Comments) and Swelling   Januvia  [Sitagliptin ] Other (See Comments)    HURTS STOAMCH   Latex     IRRITATES VAGINAL AREA   Metformin      Other Reaction(s): achy all over   Morphine  Itching    Severe    Penicillins Hives    Has patient had a PCN reaction causing immediate rash, facial/tongue/throat swelling, SOB or lightheadedness with hypotension: yes. Rash  Has patient had a PCN reaction causing severe rash involving mucus membranes or skin necrosis: Yes- rash and hives all over body  Has patient had a PCN reaction that required  hospitalization No Has patient had a PCN reaction occurring within the last 10 years: Yes  If all of the above answers are NO, then may proceed with Cephalosporin use.    Vitamin K  And Related Other (See Comments)    Electric shock     Consultations: Gastroenterology   Procedures/Studies:  EGD  Subjective: Patient was seen and examined at bedside.  Overnight events noted. Patient reports feeling much better.  H&H relatively stable and wants to be discharged home.  Discharge Exam: Vitals:   02/17/24 0325 02/17/24 0815  BP: 127/70 (!) 103/52  Pulse:  88  Resp: 15 14  Temp: 98.2 F (36.8 C) 98.2 F (36.8 C)  SpO2: 98%    Vitals:   02/16/24 2017 02/16/24 2322 02/17/24 0325 02/17/24 0815  BP: 113/61 107/61 127/70 (!) 103/52  Pulse: 80 86  88  Resp: 18 20 15 14   Temp: (!) 97.2 F (36.2 C) 98.4 F (36.9 C) 98.2 F (36.8 C) 98.2 F (36.8 C)  TempSrc: Axillary Oral Oral Oral  SpO2: 100% 97% 98%   Weight:      Height:        General: Pt is alert, awake, not in acute distress Cardiovascular: RRR, S1/S2 +, no rubs, no gallops Respiratory: CTA bilaterally, no wheezing, no rhonchi Abdominal: Soft, NT, ND, bowel sounds + Extremities: no edema, no cyanosis    The results of significant diagnostics from this hospitalization (including imaging, microbiology, ancillary and laboratory) are listed below for reference.     Microbiology: Recent Results (from the past 240 hours)  Culture, Urine     Status: None   Collection Time: 02/13/24 11:14 AM   Specimen: Urine  Result Value Ref Range Status   MICRO NUMBER: [id number]  Final   SPECIMEN QUALITY: Adequate  Final   Sample Source URINE  Final   STATUS: FINAL  Final   Result: No Growth  Final  Blood culture (routine x 2)     Status: None (Preliminary result)   Collection Time: 02/15/24  9:52 PM   Specimen: BLOOD RIGHT FOREARM  Result Value Ref Range Status   Specimen Description BLOOD RIGHT FOREARM  Final   Special  Requests   Final    BOTTLES DRAWN AEROBIC AND ANAEROBIC Blood Culture adequate volume   Culture   Final    NO GROWTH 2 DAYS Performed at [facility], [address] [place], [place] [address]    Report Status PENDING  Incomplete  Blood culture (routine  x 2)     Status: None (Preliminary result)   Collection Time: 02/15/24  9:52 PM   Specimen: BLOOD LEFT ARM  Result Value Ref Range Status   Specimen Description BLOOD LEFT ARM  Final   Special Requests   Final    BOTTLES DRAWN AEROBIC ONLY Blood Culture adequate volume   Culture   Final    NO GROWTH 2 DAYS Performed at [facility], [address] [place], [place] [address]    Report Status PENDING  Incomplete     Labs: BNP (last 3 results) Recent Labs    12/15/23 0354  BNP 15.8   Basic Metabolic Panel: Recent Labs  Lab 02/15/24 2152 02/15/24 2215 02/16/24 0549 02/17/24 0333  NA 132* 136 135 131*  K 3.4* 3.4* 3.0* 4.1  CL 98 97* 100 99  CO2 23  --  24 21*  GLUCOSE 286* 281* 92 231*  BUN 6 5* 6 6  CREATININE 0.94 0.60 1.50* 0.65  CALCIUM  8.8*  --  8.4* 8.2*  MG  --   --   --  1.7  PHOS  --   --   --  3.0   Liver Function Tests: Recent Labs  Lab 02/15/24 2152 02/17/24 0333  AST 50* 55*  ALT 26 26  ALKPHOS 110 78  BILITOT 4.6* 4.0*  PROT 7.0 6.1*  ALBUMIN  3.7 3.0*   Recent Labs  Lab 02/15/24 2152  LIPASE 51   Recent Labs  Lab 02/15/24 2152  AMMONIA 92*   CBC: Recent Labs  Lab 02/15/24 2152 02/15/24 2215 02/16/24 0549 02/16/24 1511 02/16/24 2156 02/17/24 0333 02/17/24 0839  WBC 1.9*  --  1.9*  --   --  2.1*  --   HGB 8.2*   < > 7.5* 7.2* 9.0* 8.8* 8.4*  HCT 26.3*   < > 24.0* 23.4* 28.3* 27.7* 26.3*  MCV 90.4  --  89.2  --   --  88.2  --   PLT 66*  --  62*  --   --  67*  --    < > = values in this interval not displayed.   Cardiac Enzymes: No results for input(s): CKTOTAL, CKMB, CKMBINDEX, TROPONINI in the last 168 hours. BNP: Invalid input(s):  POCBNP CBG: Recent Labs  Lab 02/16/24 1646 02/16/24 2004 02/16/24 2321 02/17/24 0324 02/17/24 0817  GLUCAP 323* 389* 323* 210* 232*   D-Dimer No results for input(s): DDIMER in the last 72 hours. Hgb A1c No results for input(s): HGBA1C in the last 72 hours. Lipid Profile No results for input(s): CHOL, HDL, LDLCALC, TRIG, CHOLHDL, LDLDIRECT in the last 72 hours. Thyroid  function studies No results for input(s): TSH, T4TOTAL, T3FREE, THYROIDAB in the last 72 hours.  Invalid input(s): FREET3 Anemia work up No results for input(s): VITAMINB12, FOLATE, FERRITIN, TIBC, IRON, RETICCTPCT in the last 72 hours. Urinalysis    Component Value Date/Time   COLORURINE AMBER (A) 02/16/2024 0420   APPEARANCEUR CLEAR 02/16/2024 0420   LABSPEC >1.046 (H) 02/16/2024 0420   PHURINE 5.0 02/16/2024 0420   GLUCOSEU >=500 (A) 02/16/2024 0420   HGBUR NEGATIVE 02/16/2024 0420   BILIRUBINUR NEGATIVE 02/16/2024 0420   BILIRUBINUR 1+ 02/13/2024 1151   KETONESUR NEGATIVE 02/16/2024 0420   PROTEINUR NEGATIVE 02/16/2024 0420   UROBILINOGEN 1.0 02/13/2024 1151   UROBILINOGEN 1.0 12/21/2014 1020   NITRITE NEGATIVE 02/16/2024 0420   LEUKOCYTESUR NEGATIVE 02/16/2024 0420   Sepsis Labs Recent Labs  Lab 02/15/24 2152 02/16/24 0549 02/17/24  0333  WBC 1.9* 1.9* 2.1*   Microbiology Recent Results (from the past 240 hours)  Culture, Urine     Status: None   Collection Time: 02/13/24 11:14 AM   Specimen: Urine  Result Value Ref Range Status   MICRO NUMBER: [id number]  Final   SPECIMEN QUALITY: Adequate  Final   Sample Source URINE  Final   STATUS: FINAL  Final   Result: No Growth  Final  Blood culture (routine x 2)     Status: None (Preliminary result)   Collection Time: 02/15/24  9:52 PM   Specimen: BLOOD RIGHT FOREARM  Result Value Ref Range Status   Specimen Description BLOOD RIGHT FOREARM  Final   Special Requests   Final    BOTTLES DRAWN AEROBIC AND  ANAEROBIC Blood Culture adequate volume   Culture   Final    NO GROWTH 2 DAYS Performed at [facility], [address] [place], [place] [address]    Report Status PENDING  Incomplete  Blood culture (routine x 2)     Status: None (Preliminary result)   Collection Time: 02/15/24  9:52 PM   Specimen: BLOOD LEFT ARM  Result Value Ref Range Status   Specimen Description BLOOD LEFT ARM  Final   Special Requests   Final    BOTTLES DRAWN AEROBIC ONLY Blood Culture adequate volume   Culture   Final    NO GROWTH 2 DAYS Performed at [facility], [address], [place], [place] [address]    Report Status PENDING  Incomplete     Time coordinating discharge: Over 30 minutes  SIGNED:   Darcel Dawley, MD  [facility] 02/17/2024, 1:47 PM Pager   If 7PM-7AM, please contact night-coverage

## 2024-02-17 NOTE — Progress Notes (Signed)
 Progress Note   Assessment    43 year old female with decompensated MASH cirrhosis with portal hypertension, refractory ascites needing LVP, hepatic encephalopathy admitted with hematemesis.  Principal Problem:   Hematemesis   Recommendations   1.  Hematemesis --self-limited.  Stable hemoglobin.  Varices were not the source.  She does have portal gastropathy and she had a mildly inflamed gastric cardia polyp which appeared hyperplastic.  Due to its small size and difficult location for endoscopic therapy it was not removed.  It was also not bleeding at the time of EGD and there was no evidence of fresh or old blood in the upper GI tract. -- Continue PPI at home, okay with omeprazole  since she is taking it currently -- She will have labs monitored closely by [facility] hepatology  2.  Decompensated cirrhosis/refractory ascites/hepatic encephalopathy -- She remains listed for transplantation at [facility], she will have MELD labs done weekly -- Continue regular home meds with lactulose , rifaximin , diuretics  3.  Abdominal pain --crampy in nature -- Bentyl  10 mg 3 times daily as needed -- She also uses oxycodone  on occasion; caution advised here with hepatic encephalopathy, constipation and decompensated liver disease  35 minutes total spent today including patient facing time, coordination of care, reviewing medical history/procedures/pertinent radiology studies, and documentation of the encounter.    Chief Complaint   Patient still with abdominal pain which feels crampy She had a good bowel movement this morning which was brown in color No nausea or vomiting Plan is to go home She has labs on Monday for [facility] to keep a close eye on her MELD score because she remains listed for transplantation   Vital signs in last 24 hours: Temp:  [97.2 F (36.2 C)-98.4 F (36.9 C)] 98.2 F (36.8 C) (11/07 0815) Pulse Rate:  [75-88] 88 (11/07 0815) Resp:  [14-21] 14 (11/07 0815) BP:  (102-127)/(52-70) 103/52 (11/07 0815) SpO2:  [97 %-100 %] 98 % (11/07 0325) Last BM Date : 02/17/24 Gen: awake, alert, NAD HEENT: Icteric CV: RRR, no mrg Pulm: CTA b/l Abd: soft, mildly tender and distended, no rebound or guarding, +BS throughout Ext: no c/c/e Neuro: nonfocal, no asterixis  Intake/Output from previous day: 11/06 0701 - 11/07 0700 In: 2009.7 [I.V.:1628.9; Blood:315; IV Piggyback:65.8] Out: -  Intake/Output this shift: Total I/O In: 240 [P.O.:240] Out: -   Lab Results: Recent Labs    02/15/24 2152 02/15/24 2215 02/16/24 0549 02/16/24 1511 02/16/24 2156 02/17/24 0333 02/17/24 0839  WBC 1.9*  --  1.9*  --   --  2.1*  --   HGB 8.2*   < > 7.5*   < > 9.0* 8.8* 8.4*  HCT 26.3*   < > 24.0*   < > 28.3* 27.7* 26.3*  PLT 66*  --  62*  --   --  67*  --    < > = values in this interval not displayed.   BMET Recent Labs    02/15/24 2152 02/15/24 2215 02/16/24 0549 02/17/24 0333  NA 132* 136 135 131*  K 3.4* 3.4* 3.0* 4.1  CL 98 97* 100 99  CO2 23  --  24 21*  GLUCOSE 286* 281* 92 231*  BUN 6 5* 6 6  CREATININE 0.94 0.60 1.50* 0.65  CALCIUM  8.8*  --  8.4* 8.2*   LFT Recent Labs    02/17/24 0333  PROT 6.1*  ALBUMIN  3.0*  AST 55*  ALT 26  ALKPHOS 78  BILITOT 4.0*   PT/INR Recent Labs  02/16/24 0549 02/17/24 0333  LABPROT 22.3* 23.0*  INR 1.9* 1.9*   MELD 3.0: 23 at 02/17/2024  3:33 AM MELD-Na: 23 at 02/17/2024  3:33 AM Calculated from: Serum Creatinine: 0.65 mg/dL (Using min of 1 mg/dL) at 88/05/7972  6:66 AM Serum Sodium: 131 mmol/L at 02/17/2024  3:33 AM Total Bilirubin: 4 mg/dL at 88/05/7972  6:66 AM Serum Albumin : 3 g/dL at 88/05/7972  6:66 AM INR(ratio): 1.9 at 02/17/2024  3:33 AM Age at listing (hypothetical): 43 years Sex: Female at 02/17/2024  3:33 AM    Studies/Results: CT Angio Abd/Pel W and/or Wo Contrast Result Date: 02/16/2024 EXAM: CTA ABDOMEN AND PELVIS WITHOUT AND WITH CONTRAST 02/15/2024 11:38:08 PM TECHNIQUE: CTA images  of the abdomen and pelvis without and with intravenous contrast. 75 mL (iohexol  (OMNIPAQUE ) 350 MG/ML injection 75 mL IOHEXOL  350 MG/ML SOLN) was administered. Three-dimensional MIP/volume rendered formations were performed. Automated exposure control, iterative reconstruction, and/or weight based adjustment of the mA/kV was utilized to reduce the radiation dose to as low as reasonably achievable. COMPARISON: CT abdomen and pelvis 02/10/1999 and 02/04/2024. CLINICAL HISTORY: upper GI bleed FINDINGS: VASCULATURE: AORTA: No acute finding. No abdominal aortic aneurysm. No dissection. CELIAC TRUNK: No acute finding. No occlusion or significant stenosis. SUPERIOR MESENTERIC ARTERY: No acute finding. No occlusion or significant stenosis. RENAL ARTERIES: No acute finding. No occlusion or significant stenosis. ILIAC ARTERIES: No acute finding. No occlusion or significant stenosis. *Additional vascular findings:* Portal vein is enlarged. Splenic varices are present. There is recanalization of the umbilical vein. LIVER: Nodular liver contour and diffuse hepatic heterogeneity is again seen compatible with cirrhosis. GALLBLADDER AND BILE DUCTS: There is gallbladder wall edema. Small gallstones are present. No biliary ductal dilatation. SPLEEN: The spleen is moderately enlarged and unchanged. PANCREAS: The pancreas is unremarkable. ADRENAL GLANDS: Bilateral adrenal glands demonstrate no acute abnormality. KIDNEYS, URETERS AND BLADDER: Previous subcentimeter cyst in the superior pole and inferior pole of the left kidney. No stones in the kidneys or ureters. No hydronephrosis. No perinephric or periureteral stranding. Urinary bladder is unremarkable. GI AND BOWEL: Stomach and duodenal sweep demonstrate no acute abnormality. There is diffuse colonic wall thickening/edema, more significant in the ascending colon. The appendix is normal. There is no active gastrointestinal bleeding identified. There is wall thickening and mesenteric  edema involving jejunal loops. There is no bowel obstruction. No abnormal bowel distension. REPRODUCTIVE: Uterus is surgically absent. PERITONEUM AND RETROPERITONEUM: Moderate-to-large amount of ascites appears unchanged. No free air. LUNG BASE: There is atelectasis in the left lung base. LYMPH NODES: No lymphadenopathy. BONES AND SOFT TISSUES: No acute abnormality of the bones. Diffuse body wall edema has increased. IMPRESSION: 1. No active gastrointestinal bleeding identified. 2. Cirrhosis with nodular liver contour, diffuse hepatic heterogeneity, portal vein enlargement, splenic varices, and recanalized umbilical vein, compatible with portal hypertension. 3. Moderate-to-large ascites, unchanged, with increased diffuse body wall edema. 4. Gallbladder wall edema with small gallstones, which may reflect cholecystitis versus third-spacing; correlate clinically. 5. Diffuse colonic wall thickening/edema, greatest in the ascending colon, and jejunal wall thickening with mesenteric edema, which may reflect portal hypertensive enterocolopathy and/or superimposed infectious/inflammatory enterocolitis; correlate clinically. 6. Moderate splenomegaly, unchanged. Electronically signed by: Greig Pique MD 02/16/2024 12:06 AM EST RP Workstation: [id number]      LOS: 1 day   Gordy CHRISTELLA Starch, MD 02/17/2024, 11:32 AM See [facility], [facility], to contact our on call provider

## 2024-02-17 NOTE — Plan of Care (Signed)
   Problem: Education: Goal: Knowledge of General Education information will improve Description Including pain rating scale, medication(s)/side effects and non-pharmacologic comfort measures Outcome: Progressing

## 2024-02-17 NOTE — Progress Notes (Signed)
 Reviewed AVS, patient expressed understanding of medications, MD follow up reviewed.   Patient states all belongings brought to the hospital at time of admission are accounted for and packed to take home.  Patient informed and expressed understanding where to pick up discharge medications.  Patient states she will go visit a relative until ride arrives.

## 2024-02-19 ENCOUNTER — Observation Stay (HOSPITAL_COMMUNITY)

## 2024-02-19 ENCOUNTER — Emergency Department (HOSPITAL_COMMUNITY)

## 2024-02-19 ENCOUNTER — Encounter (HOSPITAL_COMMUNITY): Payer: Self-pay

## 2024-02-19 ENCOUNTER — Observation Stay (HOSPITAL_COMMUNITY)
Admission: EM | Admit: 2024-02-19 | Discharge: 2024-02-21 | Disposition: A | Discharge status: Home / Self Care | Attending: Internal Medicine | Admitting: Internal Medicine

## 2024-02-19 ENCOUNTER — Other Ambulatory Visit: Payer: Self-pay

## 2024-02-19 DIAGNOSIS — G629 Polyneuropathy, unspecified: Secondary | ICD-10-CM

## 2024-02-19 DIAGNOSIS — E1169 Type 2 diabetes mellitus with other specified complication: Secondary | ICD-10-CM

## 2024-02-19 DIAGNOSIS — E119 Type 2 diabetes mellitus without complications: Secondary | ICD-10-CM | POA: Diagnosis not present

## 2024-02-19 DIAGNOSIS — Z794 Long term (current) use of insulin: Secondary | ICD-10-CM

## 2024-02-19 DIAGNOSIS — N390 Urinary tract infection, site not specified: Secondary | ICD-10-CM | POA: Insufficient documentation

## 2024-02-19 DIAGNOSIS — I8500 Esophageal varices without bleeding: Secondary | ICD-10-CM | POA: Diagnosis not present

## 2024-02-19 DIAGNOSIS — Z6830 Body mass index (BMI) 30.0-30.9, adult: Secondary | ICD-10-CM | POA: Diagnosis not present

## 2024-02-19 DIAGNOSIS — K219 Gastro-esophageal reflux disease without esophagitis: Secondary | ICD-10-CM | POA: Diagnosis not present

## 2024-02-19 DIAGNOSIS — K7460 Unspecified cirrhosis of liver: Secondary | ICD-10-CM | POA: Diagnosis not present

## 2024-02-19 DIAGNOSIS — F32A Depression, unspecified: Secondary | ICD-10-CM | POA: Diagnosis present

## 2024-02-19 DIAGNOSIS — E669 Obesity, unspecified: Secondary | ICD-10-CM | POA: Diagnosis present

## 2024-02-19 DIAGNOSIS — Z91040 Latex allergy status: Secondary | ICD-10-CM | POA: Diagnosis not present

## 2024-02-19 DIAGNOSIS — D61810 Antineoplastic chemotherapy induced pancytopenia: Secondary | ICD-10-CM | POA: Diagnosis not present

## 2024-02-19 DIAGNOSIS — K7469 Other cirrhosis of liver: Secondary | ICD-10-CM | POA: Diagnosis present

## 2024-02-19 DIAGNOSIS — K7581 Nonalcoholic steatohepatitis (NASH): Secondary | ICD-10-CM | POA: Insufficient documentation

## 2024-02-19 DIAGNOSIS — G2581 Restless legs syndrome: Secondary | ICD-10-CM | POA: Diagnosis not present

## 2024-02-19 DIAGNOSIS — Z8616 Personal history of COVID-19: Secondary | ICD-10-CM | POA: Diagnosis not present

## 2024-02-19 DIAGNOSIS — R9431 Abnormal electrocardiogram [ECG] [EKG]: Secondary | ICD-10-CM | POA: Diagnosis present

## 2024-02-19 DIAGNOSIS — G609 Hereditary and idiopathic neuropathy, unspecified: Secondary | ICD-10-CM | POA: Insufficient documentation

## 2024-02-19 DIAGNOSIS — D61818 Other pancytopenia: Secondary | ICD-10-CM | POA: Diagnosis not present

## 2024-02-19 DIAGNOSIS — K7290 Hepatic failure, unspecified without coma: Secondary | ICD-10-CM | POA: Diagnosis present

## 2024-02-19 DIAGNOSIS — I4581 Long QT syndrome: Secondary | ICD-10-CM | POA: Insufficient documentation

## 2024-02-19 DIAGNOSIS — R188 Other ascites: Secondary | ICD-10-CM | POA: Diagnosis not present

## 2024-02-19 DIAGNOSIS — G6289 Other specified polyneuropathies: Secondary | ICD-10-CM

## 2024-02-19 DIAGNOSIS — R1011 Right upper quadrant pain: Secondary | ICD-10-CM

## 2024-02-19 DIAGNOSIS — F419 Anxiety disorder, unspecified: Secondary | ICD-10-CM | POA: Diagnosis present

## 2024-02-19 DIAGNOSIS — R109 Unspecified abdominal pain: Secondary | ICD-10-CM | POA: Diagnosis present

## 2024-02-19 DIAGNOSIS — K766 Portal hypertension: Secondary | ICD-10-CM | POA: Diagnosis not present

## 2024-02-19 DIAGNOSIS — K3189 Other diseases of stomach and duodenum: Secondary | ICD-10-CM | POA: Diagnosis present

## 2024-02-19 LAB — CBC WITH DIFFERENTIAL/PLATELET
Abs Immature Granulocytes: 0.01 K/uL (ref 0.00–0.07)
Basophils Absolute: 0 K/uL (ref 0.0–0.1)
Basophils Relative: 1 %
Eosinophils Absolute: 0.1 K/uL (ref 0.0–0.5)
Eosinophils Relative: 4 %
HCT: 28 % — ABNORMAL LOW (ref 36.0–46.0)
Hemoglobin: 8.7 g/dL — ABNORMAL LOW (ref 12.0–15.0)
Immature Granulocytes: 0 %
Lymphocytes Relative: 12 %
Lymphs Abs: 0.3 K/uL — ABNORMAL LOW (ref 0.7–4.0)
MCH: 27.9 pg (ref 26.0–34.0)
MCHC: 31.1 g/dL (ref 30.0–36.0)
MCV: 89.7 fL (ref 80.0–100.0)
Monocytes Absolute: 0.3 K/uL (ref 0.1–1.0)
Monocytes Relative: 11 %
Neutro Abs: 1.9 K/uL (ref 1.7–7.7)
Neutrophils Relative %: 72 %
Platelets: 65 K/uL — ABNORMAL LOW (ref 150–400)
RBC: 3.12 MIL/uL — ABNORMAL LOW (ref 3.87–5.11)
RDW: 16.9 % — ABNORMAL HIGH (ref 11.5–15.5)
WBC: 2.6 K/uL — ABNORMAL LOW (ref 4.0–10.5)
nRBC: 0 % (ref 0.0–0.2)

## 2024-02-19 LAB — GLUCOSE, CAPILLARY
Glucose-Capillary: 300 mg/dL — ABNORMAL HIGH (ref 70–99)
Glucose-Capillary: 289 mg/dL — ABNORMAL HIGH (ref 70–99)

## 2024-02-19 LAB — COMPREHENSIVE METABOLIC PANEL WITH GFR
ALT: 25 U/L (ref 0–44)
AST: 44 U/L — ABNORMAL HIGH (ref 15–41)
Albumin: 3 g/dL — ABNORMAL LOW (ref 3.5–5.0)
Alkaline Phosphatase: 88 U/L (ref 38–126)
Anion gap: 13 (ref 5–15)
BUN: 7 mg/dL (ref 6–20)
CO2: 20 mmol/L — ABNORMAL LOW (ref 22–32)
Calcium: 8.4 mg/dL — ABNORMAL LOW (ref 8.9–10.3)
Chloride: 97 mmol/L — ABNORMAL LOW (ref 98–111)
Creatinine, Ser: 0.86 mg/dL (ref 0.44–1.00)
GFR, Estimated: >60 mL/min (ref >60)
Glucose, Bld: 320 mg/dL — ABNORMAL HIGH (ref 70–99)
Potassium: 4.1 mmol/L (ref 3.5–5.1)
Sodium: 130 mmol/L — ABNORMAL LOW (ref 135–145)
Total Bilirubin: 5 mg/dL — ABNORMAL HIGH (ref 0.0–1.2)
Total Protein: 6.4 g/dL — ABNORMAL LOW (ref 6.5–8.1)

## 2024-02-19 LAB — PROTIME-INR
INR: 1.8 — ABNORMAL HIGH (ref 0.8–1.2)
Prothrombin Time: 21.5 s — ABNORMAL HIGH (ref 11.4–15.2)

## 2024-02-19 LAB — AMMONIA: Ammonia: 61 umol/L — ABNORMAL HIGH (ref 9–35)

## 2024-02-19 LAB — APTT: aPTT: 41 s — ABNORMAL HIGH (ref 24–36)

## 2024-02-19 LAB — LIPASE, BLOOD: Lipase: 37 U/L (ref 11–51)

## 2024-02-19 MED ORDER — TEMAZEPAM 15 MG PO CAPS
30.0000 mg | ORAL_CAPSULE | Freq: Every evening | ORAL | Status: DC | PRN
  Administered 2024-02-20: 30 mg via ORAL
  Filled 2024-02-19: qty 2

## 2024-02-19 MED ORDER — MIDODRINE HCL 5 MG PO TABS
10.0000 mg | ORAL_TABLET | Freq: Two times a day (BID) | ORAL | Status: DC
  Administered 2024-02-19 – 2024-02-21 (×4): 10 mg via ORAL
  Filled 2024-02-19 (×4): qty 2

## 2024-02-19 MED ORDER — SODIUM CHLORIDE 0.9 % IV SOLN
2.0000 g | INTRAVENOUS | Status: DC
  Administered 2024-02-19 – 2024-02-20 (×2): 2 g via INTRAVENOUS
  Filled 2024-02-19 (×2): qty 20

## 2024-02-19 MED ORDER — HYDROCODONE-ACETAMINOPHEN 5-325 MG PO TABS
1.0000 | ORAL_TABLET | Freq: Four times a day (QID) | ORAL | Status: AC | PRN
  Administered 2024-02-19 – 2024-02-20 (×3): 1 via ORAL
  Filled 2024-02-19 (×3): qty 1

## 2024-02-19 MED ORDER — GUAIFENESIN 100 MG/5ML PO LIQD: 5.0000 mL | ORAL | Status: DC | PRN

## 2024-02-19 MED ORDER — INSULIN ASPART 100 UNIT/ML IJ SOLN
0.0000 [IU] | Freq: Every day | INTRAMUSCULAR | Status: DC
  Administered 2024-02-19: 3 [IU] via SUBCUTANEOUS
  Administered 2024-02-20: 2 [IU] via SUBCUTANEOUS
  Filled 2024-02-19: qty 3
  Filled 2024-02-19: qty 2

## 2024-02-19 MED ORDER — ACETAMINOPHEN 650 MG RE SUPP: 650.0000 mg | Freq: Four times a day (QID) | RECTAL | Status: DC | PRN

## 2024-02-19 MED ORDER — POLYETHYLENE GLYCOL 3350 17 G PO PACK: 17.0000 g | PACK | Freq: Every day | ORAL | Status: DC | PRN

## 2024-02-19 MED ORDER — SODIUM CHLORIDE 0.9% FLUSH
3.0000 mL | Freq: Two times a day (BID) | INTRAVENOUS | Status: DC
  Administered 2024-02-19 – 2024-02-21 (×4): 3 mL via INTRAVENOUS

## 2024-02-19 MED ORDER — INSULIN ASPART 100 UNIT/ML IJ SOLN
0.0000 [IU] | Freq: Three times a day (TID) | INTRAMUSCULAR | Status: DC
  Administered 2024-02-19: 8 [IU] via SUBCUTANEOUS
  Administered 2024-02-20: 5 [IU] via SUBCUTANEOUS
  Administered 2024-02-20: 11 [IU] via SUBCUTANEOUS
  Administered 2024-02-20: 5 [IU] via SUBCUTANEOUS
  Administered 2024-02-21: 2 [IU] via SUBCUTANEOUS
  Administered 2024-02-21: 3 [IU] via SUBCUTANEOUS
  Filled 2024-02-19: qty 8
  Filled 2024-02-19: qty 2

## 2024-02-19 MED ORDER — ACETAMINOPHEN 325 MG PO TABS
650.0000 mg | ORAL_TABLET | Freq: Four times a day (QID) | ORAL | Status: DC | PRN
  Administered 2024-02-20: 650 mg via ORAL
  Filled 2024-02-19: qty 2

## 2024-02-19 MED ORDER — SPIRONOLACTONE 25 MG PO TABS
25.0000 mg | ORAL_TABLET | Freq: Every day | ORAL | Status: DC
  Administered 2024-02-20: 25 mg via ORAL
  Filled 2024-02-19: qty 1

## 2024-02-19 MED ORDER — ROPINIROLE HCL 0.5 MG PO TABS
1.0000 mg | ORAL_TABLET | Freq: Every day | ORAL | Status: DC
  Administered 2024-02-19 – 2024-02-20 (×2): 1 mg via ORAL
  Filled 2024-02-19 (×2): qty 2

## 2024-02-19 MED ORDER — INSULIN ASPART 100 UNIT/ML IJ SOLN
30.0000 [IU] | Freq: Three times a day (TID) | INTRAMUSCULAR | Status: DC
  Administered 2024-02-19 – 2024-02-21 (×5): 30 [IU] via SUBCUTANEOUS
  Filled 2024-02-19 (×6): qty 30

## 2024-02-19 MED ORDER — FLUOXETINE HCL 20 MG PO CAPS
40.0000 mg | ORAL_CAPSULE | Freq: Two times a day (BID) | ORAL | Status: DC
  Administered 2024-02-19 – 2024-02-21 (×4): 40 mg via ORAL
  Filled 2024-02-19 (×4): qty 2

## 2024-02-19 MED ORDER — RIFAXIMIN 550 MG PO TABS
550.0000 mg | ORAL_TABLET | Freq: Two times a day (BID) | ORAL | Status: DC
  Administered 2024-02-19 – 2024-02-21 (×4): 550 mg via ORAL
  Filled 2024-02-19 (×5): qty 1

## 2024-02-19 MED ORDER — PANTOPRAZOLE SODIUM 40 MG PO TBEC
40.0000 mg | DELAYED_RELEASE_TABLET | Freq: Two times a day (BID) | ORAL | Status: DC
  Administered 2024-02-19 – 2024-02-21 (×4): 40 mg via ORAL
  Filled 2024-02-19 (×4): qty 1

## 2024-02-19 MED ORDER — INSULIN ASPART 100 UNIT/ML IJ SOLN: 20.0000 [IU] | Freq: Three times a day (TID) | INTRAMUSCULAR | Status: DC

## 2024-02-19 MED ORDER — FENTANYL CITRATE (PF) 50 MCG/ML IJ SOSY
50.0000 ug | PREFILLED_SYRINGE | Freq: Once | INTRAMUSCULAR | Status: AC
  Administered 2024-02-19: 50 ug via INTRAVENOUS
  Filled 2024-02-19: qty 1

## 2024-02-19 MED ORDER — LACTULOSE 10 GM/15ML PO SOLN
20.0000 g | Freq: Two times a day (BID) | ORAL | Status: DC
  Administered 2024-02-19 – 2024-02-21 (×4): 20 g via ORAL
  Filled 2024-02-19 (×4): qty 30

## 2024-02-19 NOTE — H&P (Signed)
 History and Physical   Laurie Sutton FMW:[id number] DOB: [birth year] DOA: 02/19/2024  PCP: [name], MD   Patient coming from: Home  Chief Complaint: Nausea, vomiting, abdominal distention/discomfort  HPI: Laurie Sutton is a 43 y.o. female with medical history significant of diabetes, GERD, QT prolongation, anxiety, depression, NASH cirrhosis with ascites and portal hypertension and history of hepatic encephalopathy, history of SBP, RLS, neuropathy, pancytopenia, obesity, back pain presenting with nausea vomiting abdominal distention/discomfort.  Patient has known history of [name] with ascites as above.  Her ascites is recurrent and she gets weekly paracenteses.  Last paracentesis was 4 days ago and she was admitted at that time from November 5-November 7 for concurrent hematemesis.  CT was negative for acute bleeding and EGD by GI showed no acute bleeding and no stigmata of recent bleeding of her varices.  Patient was discharged in stable condition.  Patient has noted a more rapid accumulation of ascites with typical abdominal discomfort, but also some additional sharp pains in her right abdomen worse with coughs.  Called her transplant team where she is being evaluated at [facility] and they recommended evaluation in the ED.  Patient denies fevers, chills, chest pain, shortness of breath, constipation, diarrhea.  ED Course: Vital signs in the ED notable for blood pressure in the 100s-120s systolic, respiratory rate in the teens-20s.  Lab workup included CMP with sodium 130, potassium 4.1, chloride 97, bicarb 20, glucose 320, calcium  8.4, protein 6.4, albumin  3.0, AST stable at 44, T. bili stable at 5.0.  CBC with hemoglobin stable at 8.9, leukopenia stable at 2.6, platelets stable at 65.  INR stable at 1.8.  PT 21.5, PTT 41.  Lipase normal.  Ammonia mildly elevated but stable at 61.  Chest x-ray showed persistent low lung volumes with vascular congestion and atelectasis.  CT  not repeated as she has these commonly and symptoms are typical for her.  She received fentanyl  in the ED.  Review of Systems: As per HPI otherwise all other systems reviewed and are negative.  Past Medical History:  Diagnosis Date   ADHD    Allergy    Anemia    IRON TRANSFUSION 07-2020 NONE SINCE   Anxiety    Back pain    COVID 08/12/2019   ALL SYMPTOMS REOLVED PER PT   Depression    Diabetic neuropathy (HCC) 11/11/2020   FEET   dm type 2    sees Dr. Odella Jacobson at [facility]   Fatty liver    GERD (gastroesophageal reflux disease)    History of kidney stones    Joint pain    Menorrhagia 11/11/2020   Migraines    Murmur, cardiac    FAINT NO CARDIOLOGIST   Other fatigue    Pneumonia 08/12/2019   COVID PNEUMONIA ALL SYMPTOMS RESOLVED PER PT   Sepsis (HCC) 10/04/2023   Shortness of breath on exertion     Past Surgical History:  Procedure Laterality Date   CESAREAN SECTION  12/13/2006   COLONOSCOPY WITH PROPOFOL  N/A 08/02/2023   Procedure: COLONOSCOPY WITH PROPOFOL ;  Surgeon: Legrand Victory LITTIE DOUGLAS, MD;  Location: [facility];  Service: Gastroenterology;  Laterality: N/A;   ESOPHAGOGASTRODUODENOSCOPY N/A 02/16/2024   Procedure: EGD (ESOPHAGOGASTRODUODENOSCOPY);  Surgeon: Albertus Gordy HERO, MD;  Location: [facility];  Service: Gastroenterology;  Laterality: N/A;   ESOPHAGOGASTRODUODENOSCOPY (EGD) WITH PROPOFOL  N/A 08/02/2023   Procedure: ESOPHAGOGASTRODUODENOSCOPY (EGD) WITH PROPOFOL ;  Surgeon: Legrand Victory LITTIE DOUGLAS, MD;  Location: [facility];  Service: Gastroenterology;  Laterality: N/A;   EXTRACORPOREAL SHOCK WAVE LITHOTRIPSY  2018   FLEXIBLE SIGMOIDOSCOPY N/A 11/20/2023   Procedure: KINGSTON SIDE;  Surgeon: Albertus Gordy HERO, MD;  Location: [facility];  Service: Gastroenterology;  Laterality: N/A;   FOOT SURGERY Right 10/02/2020   RIGHT FOOT HEEL AND TOES, SUR[facility] CENTER OFF [address]   IR PARACENTESIS  07/06/2023   IR PARACENTESIS  08/18/2023   IR PARACENTESIS   10/05/2023   IR PARACENTESIS  10/11/2023   IR PARACENTESIS  10/20/2023   IR PARACENTESIS  11/08/2023   IR PARACENTESIS  11/28/2023   IR PARACENTESIS  12/09/2023   IR PARACENTESIS  12/15/2023   IR PARACENTESIS  12/16/2023   IR PARACENTESIS  12/23/2023   IR PARACENTESIS  12/30/2023   IR PARACENTESIS  01/06/2024   IR PARACENTESIS  01/06/2024   IR PARACENTESIS  01/10/2024   IR PARACENTESIS  01/13/2024   IR PARACENTESIS  01/20/2024   IR PARACENTESIS  01/27/2024   IR PARACENTESIS  02/01/2024   IR PARACENTESIS  02/03/2024   IR PARACENTESIS  02/08/2024   IR PARACENTESIS  02/15/2024   IR TRANSCATHETER BX  07/26/2023   IR US  GUIDE VASC ACCESS RIGHT  07/26/2023   IR VENOGRAM HEPATIC W HEMODYNAMIC EVALUATION  07/26/2023   LAPAROSCOPIC VAGINAL HYSTERECTOMY WITH SALPINGECTOMY Bilateral 11/17/2020   Procedure: LAPAROSCOPIC ASSISTED VAGINAL HYSTERECTOMY WITH BILATERAL SALPINGECTOMY;  Surgeon: Dannielle Bouchard, DO;  Location: [facility];  Service: Gynecology;  Laterality: Bilateral;   POLYPECTOMY  08/02/2023   Procedure: POLYPECTOMY, INTESTINE;  Surgeon: Legrand Victory LITTIE DOUGLAS, MD;  Location: [facility];  Service: Gastroenterology;;    Social History  reports that she has never smoked. She has been exposed to tobacco smoke. She has never used smokeless tobacco. She reports that she does not drink alcohol and does not use drugs.  Allergies  Allergen Reactions   Vancomycin  Itching   Amoxicillin Hives and Itching   Azithromycin      DOES NOT WORK   Dulaglutide  Other (See Comments)    constipation and stomach issues  **Trulicity **    Empagliflozin -Metformin  Hcl Er Other (See Comments) and Swelling   Januvia  [Sitagliptin ] Other (See Comments)    HURTS STOAMCH   Latex     IRRITATES VAGINAL AREA   Metformin      Other Reaction(s): achy all over   Morphine  Itching    Severe    Penicillins Hives    Has patient had a PCN reaction causing immediate rash, facial/tongue/throat swelling, SOB or lightheadedness  with hypotension: yes. Rash  Has patient had a PCN reaction causing severe rash involving mucus membranes or skin necrosis: Yes- rash and hives all over body  Has patient had a PCN reaction that required hospitalization No Has patient had a PCN reaction occurring within the last 10 years: Yes  If all of the above answers are NO, then may proceed with Cephalosporin use.    Vitamin K  And Related Other (See Comments)    Electric shock     Family History  Problem Relation Age of Onset   Colon polyps Mother    Depression Mother    Anxiety disorder Mother    Colon polyps Father    Sleep apnea Father    Anxiety disorder Father    Depression Father    Diabetes Father    Bladder Cancer Father 44   Rheum arthritis Father    Migraines Maternal Aunt    Migraines Maternal Grandmother    Diabetes Maternal Grandfather    Healthy  Daughter    Asthma Daughter    Anxiety disorder Daughter    Anxiety disorder Son    Asthma Son    Healthy Son    Cerebral aneurysm Cousin    Heart disease Other    Depression Other    Anxiety disorder Other    Sleep apnea Other    Colon cancer Neg Hx    Esophageal cancer Neg Hx    Stomach cancer Neg Hx    Rectal cancer Neg Hx   Reviewed on admission  Prior to Admission medications   Medication Sig Start Date End Date Taking? Authorizing Provider  baclofen (LIORESAL) 10 MG tablet Take 10 mg by mouth once a week. After paracentesis 02/10/24 02/09/25  [provider]  Continuous Glucose Sensor (DEXCOM G7 SENSOR) MISC Use 1 sensor for continuous glucose monitoring every 10 days for 30 days 05/25/23   Braulio Hough, MD  FLUoxetine  (PROZAC ) 40 MG capsule Take 1 capsule (40 mg total) by mouth 2 (two) times daily. 01/03/24   [name], MD  insulin  regular human CONCENTRATED (HUMULIN  R) 500 UNIT/ML injection Inject 65 Units into the skin 3 (three) times daily with meals.    [provider]  lactulose  (CHRONULAC ) 10 GM/15ML solution Take 30 mLs  (20 g total) by mouth 4 (four) times daily. Patient taking differently: Take 20 g by mouth 2 (two) times daily. 12/19/23 03/18/24  Arlice Reichert, MD  midodrine  (PROAMATINE ) 5 MG tablet Take 3 tablets (15 mg total) by mouth with breakfast, with lunch, and with evening meal. Patient taking differently: Take 10 mg by mouth 2 (two) times daily with a meal. 02/03/24   Samtani, Jai-Gurmukh, MD  montelukast  (SINGULAIR ) 10 MG tablet Take 1 tablet (10 mg total) by mouth at bedtime. 08/25/22   [name], MD  nitrofurantoin , macrocrystal-monohydrate, (MACROBID ) 100 MG capsule Take 1 capsule (100 mg total) by mouth 2 (two) times daily. 02/13/24   [name], MD  ondansetron  (ZOFRAN -ODT) 4 MG disintegrating tablet Take 1 tablet (4 mg total) by mouth every 8 (eight) hours as needed for nausea or vomiting. 01/06/24   Garrick Charleston, MD  oxyCODONE  (OXY IR/ROXICODONE ) 5 MG immediate release tablet Take 1 tablet (5 mg total) by mouth every 6 (six) hours as needed for moderate pain (pain score 4-6). 02/03/24   Samtani, Jai-Gurmukh, MD  pantoprazole  (PROTONIX ) 40 MG tablet Take 1 tablet (40 mg total) by mouth 2 (two) times daily. 02/17/24 03/18/24  Leotis Bogus, MD  rOPINIRole  (REQUIP ) 1 MG tablet Take 1 tablet (1 mg total) by mouth at bedtime. 01/03/24   [name], MD  spironolactone  (ALDACTONE ) 25 MG tablet Take 1 tablet (25 mg total) by mouth daily. 02/03/24 02/02/25  Samtani, Jai-Gurmukh, MD  temazepam  (RESTORIL ) 30 MG capsule Take 1 capsule (30 mg total) by mouth at bedtime as needed for sleep. 02/13/24   [name], MD  XIFAXAN  550 MG TABS tablet Take 550 mg by mouth 2 (two) times daily. 10/27/23   [provider]    Physical Exam: Vitals:   02/19/24 1345 02/19/24 1400 02/19/24 1415 02/19/24 1430  BP: (!) 109/50 (!) 106/56 (!) 103/53 (!) 102/54  Pulse: 96 96 96 98  Resp: (!) 25 (!) 24 19 (!) 22  Temp:      TempSrc:      SpO2: 97% 97% 98% 97%    Physical Exam Constitutional:       General: She is not in acute distress.  Appearance: Normal appearance.  HENT:     Head: Normocephalic and atraumatic.     Mouth/Throat:     Mouth: Mucous membranes are moist.     Pharynx: Oropharynx is clear.  Eyes:     Extraocular Movements: Extraocular movements intact.     Pupils: Pupils are equal, round, and reactive to light.  Cardiovascular:     Rate and Rhythm: Normal rate and regular rhythm.     Pulses: Normal pulses.     Heart sounds: Normal heart sounds.  Pulmonary:     Effort: Pulmonary effort is normal. No respiratory distress.     Breath sounds: Normal breath sounds.  Abdominal:     General: Bowel sounds are normal. There is distension.     Palpations: Abdomen is soft.     Tenderness: There is abdominal tenderness.  Musculoskeletal:        General: No swelling or deformity.  Skin:    General: Skin is warm and dry.  Neurological:     General: No focal deficit present.     Mental Status: Mental status is at baseline.    Labs on Admission: I have personally reviewed following labs and imaging studies  CBC: Recent Labs  Lab 02/15/24 2152 02/15/24 2215 02/16/24 0549 02/16/24 1511 02/16/24 2156 02/17/24 0333 02/17/24 0839 02/19/24 1304  WBC 1.9*  --  1.9*  --   --  2.1*  --  2.6*  NEUTROABS  --   --   --   --   --   --   --  1.9  HGB 8.2*   < > 7.5* 7.2* 9.0* 8.8* 8.4* 8.7*  HCT 26.3*   < > 24.0* 23.4* 28.3* 27.7* 26.3* 28.0*  MCV 90.4  --  89.2  --   --  88.2  --  89.7  PLT 66*  --  62*  --   --  67*  --  65*   < > = values in this interval not displayed.    Basic Metabolic Panel: Recent Labs  Lab 02/15/24 2152 02/15/24 2215 02/16/24 0549 02/17/24 0333 02/19/24 1304  NA 132* 136 135 131* 130*  K 3.4* 3.4* 3.0* 4.1 4.1  CL 98 97* 100 99 97*  CO2 23  --  24 21* 20*  GLUCOSE 286* 281* 92 231* 320*  BUN 6 5* 6 6 7   CREATININE 0.94 0.60 1.50* 0.65 0.86  CALCIUM  8.8*  --  8.4* 8.2* 8.4*  MG  --   --   --  1.7  --   PHOS  --   --   --  3.0  --      GFR: Estimated Creatinine Clearance: 91.9 mL/min (by C-G formula based on SCr of 0.86 mg/dL).  Liver Function Tests: Recent Labs  Lab 02/15/24 2152 02/17/24 0333 02/19/24 1304  AST 50* 55* 44*  ALT 26 26 25   ALKPHOS 110 78 88  BILITOT 4.6* 4.0* 5.0*  PROT 7.0 6.1* 6.4*  ALBUMIN  3.7 3.0* 3.0*    Urine analysis:    Component Value Date/Time   COLORURINE AMBER (A) 02/16/2024 0420   APPEARANCEUR CLEAR 02/16/2024 0420   LABSPEC >1.046 (H) 02/16/2024 0420   PHURINE 5.0 02/16/2024 0420   GLUCOSEU >=500 (A) 02/16/2024 0420   HGBUR NEGATIVE 02/16/2024 0420   BILIRUBINUR NEGATIVE 02/16/2024 0420   BILIRUBINUR 1+ 02/13/2024 1151   KETONESUR NEGATIVE 02/16/2024 0420   PROTEINUR NEGATIVE 02/16/2024 0420   UROBILINOGEN 1.0 02/13/2024 1151   UROBILINOGEN 1.0 12/21/2014  1020   NITRITE NEGATIVE 02/16/2024 0420   LEUKOCYTESUR NEGATIVE 02/16/2024 0420    Radiological Exams on Admission: DG Chest Port 1 View Result Date: 02/19/2024 EXAM: 1 VIEW(S) XRAY OF THE CHEST 02/19/2024 01:12:00 PM COMPARISON: 01/31/2024 CLINICAL HISTORY: SOB FINDINGS: LUNGS AND PLEURA: Low lung volumes with vascular crowding and bibasilar subsegmental atelectasis. No pulmonary edema. No pleural effusion. No pneumothorax. HEART AND MEDIASTINUM: Multiple wires and leads project over the chest on the frontal radiograph. No acute abnormality of the cardiac and mediastinal silhouettes. BONES AND SOFT TISSUES: No acute osseous abnormality. IMPRESSION: 1. Persistent low lung volumes with vascular crowding and bibasilar subsegmental atelectasis, unchanged from 01/31/2024. Electronically signed by: Rockey Kilts MD 02/19/2024 01:45 PM EST RP Workstation: [id number]   EKG: Independently reviewed.  Sinus rhythm at 97 bpm.  Nonspecific T wave changes.  Minimal baseline artifact in V6 only.  Assessment/Plan Active Problems:   Pancytopenia (HCC)   Anxiety and depression   GERD (gastroesophageal reflux disease)   DM (diabetes  mellitus) (HCC)   Liver cirrhosis secondary to NASH (HCC)   Obesity (BMI 30-39.9)   Portal hypertensive gastropathy (HCC)   Decompensated hepatic cirrhosis (HCC)   Peripheral neuropathy   Ascites   Restless legs syndrome   QT prolongation   Decompensated cirrhosis Ascites Portal hypertension History of hepatic encephalopathy History of SBP Esophageal varices > Known NASH cirrhosis with weekly paracenteses.  Recent admission 11/5-11/7 for hematemesis with negative CT and negative EGD. > Now with more rapid accumulation of fluid and is typical.  Having abdominal distention and discomfort as well as nausea and vomiting.  No fevers and no change in white count.  Increased tenderness at the right side of her abdomen on palpation compared to typical. > Unable to get paracentesis by IR on a nonemergent basis on the weekend.  Will ops for plan for paracentesis tomorrow. - Monitor on telemetry overnight - IR eval and management for paracentesis, will evaluate for SBP with increased tenderness. - Ceftriaxone  - Right upper quadrant ultrasound given recent CT showing evidence of gallstones and possible cholecystitis. - Continue home midodrine  and spironolactone  - Continue home lactulose  and Xifaxan  - Continue home PPI - Trend LFTs, PT/INR  Diabetes > 60-65 units Humulin  R 3 times daily with meals at home - 30 Units TID with Meals - SSI  Recent UTI > On Macrobid  and on ceftriaxone  while admitted recently. - Back on ceftriaxone  for now with increased tenderness of her abdomen and need to rule out SBP.  GERD - Continue PPI  RLS - Continue home ropinirole   Pancytopenia > Stable with WBC 2.6, hemoglobin 8.9, platelet 65.   Anxiety Depression - Continue fluoxetine , as needed Restoril   Obesity - Noted  DVT prophylaxis: SCDs, given thrombocytopenia Code Status:   Full Family Communication:  None on admission  Disposition Plan:   Patient is from:  Home  Anticipated DC  to:  Home  Anticipated DC date:  1 to 2 days  Anticipated DC barriers: None  Consults called:  Order placed for IR consult for paracentesis Admission status:  Observation, telemetry  Severity of Illness: The appropriate patient status for this patient is OBSERVATION. Observation status is judged to be reasonable and necessary in order to provide the required intensity of service to ensure the patient's safety. The patient's presenting symptoms, physical exam findings, and initial radiographic and laboratory data in the context of their medical condition is felt to place them at decreased risk for further clinical deterioration. Furthermore, it is anticipated  that the patient will be medically stable for discharge from the hospital within 2 midnights of admission.    Marsa KATHEE Scurry MD [facility]  How to contact the [facility] Attending or Consulting provider 7A - 7P or covering provider during after hours 7P -7A, for this patient?   Check the care team in [facility] and look for a) attending/consulting [facility] provider listed and b) the [facility] team listed Log into [email or web address] and use [facility] universal password to access. If you do not have the password, please contact the hospital operator. Locate the [facility] provider you are looking for under [facility] and page to a number that you can be directly reached. If you still have difficulty reaching the provider, please page the [facility] (Director on Call) for the Hospitalists listed on amion for assistance.  02/19/2024, 3:45 PM

## 2024-02-19 NOTE — ED Provider Notes (Signed)
 [place] EMERGENCY DEPARTMENT AT [facility] Provider Note   CSN: [id number] Arrival date & time: 02/19/24  1225     Patient presents with: ASCITES/Nausea/vomiting   Laurie Sutton is a 43 y.o. female.   Patient is a 43 year old female with a history of [name] who presents with abdominal distention and shortness of breath.  She has a history of recurrent ascites and gets scheduled weekly paracentesis.  She last had a paracentesis on this past Wednesday.  She was then admitted to the hospital from November 5 to November 7 for hematemesis.  She had an EGD which did not show any active bleeding.  The husband says that she has had recurrence of her ascites and feels fluid overloaded.  She had a worsening dry cough which she has had previously with fluid overload.  She has not had any fevers.  She has had some increased leg swelling.  She does have some abdominal pain which she says she typically gets when her ascites is bad.  No atypical type abdominal pain.  They contacted her transplant center and was advised to come here for further evaluation.  Her husband also states that she has been a little bit more confused.       Prior to Admission medications   Medication Sig Start Date End Date Taking? Authorizing Provider  baclofen (LIORESAL) 10 MG tablet Take 10 mg by mouth once a week. After paracentesis 02/10/24 02/09/25 Yes [provider]  clotrimazole -betamethasone  (LOTRISONE ) cream Apply 1 Application topically daily as needed (rash).   Yes [provider]  ergocalciferol  (VITAMIN D2) 1.25 MG (50000 UT) capsule Take 50,000 Units by mouth once a week.   Yes [provider]  FLUoxetine  (PROZAC ) 40 MG capsule Take 1 capsule (40 mg total) by mouth 2 (two) times daily. 01/03/24  Yes [name], MD  insulin  regular human CONCENTRATED (HUMULIN  R) 500 UNIT/ML injection Inject 60-65 Units into the skin See admin instructions. 65 in the morning, 60  in the afternoon and 60 at bedtime   Yes [provider]  lactulose  (CHRONULAC ) 10 GM/15ML solution Take 30 mLs (20 g total) by mouth 4 (four) times daily. 12/19/23 03/18/24 Yes Dahal, Chapman, MD  midodrine  (PROAMATINE ) 5 MG tablet Take 3 tablets (15 mg total) by mouth with breakfast, with lunch, and with evening meal. Patient taking differently: Take 10 mg by mouth 2 (two) times daily with a meal. 02/03/24  Yes Samtani, Jai-Gurmukh, MD  montelukast  (SINGULAIR ) 10 MG tablet Take 1 tablet (10 mg total) by mouth at bedtime. 08/25/22  Yes [name], MD  nitrofurantoin , macrocrystal-monohydrate, (MACROBID ) 100 MG capsule Take 1 capsule (100 mg total) by mouth 2 (two) times daily. 02/13/24  Yes [name], MD  ondansetron  (ZOFRAN -ODT) 4 MG disintegrating tablet Take 1 tablet (4 mg total) by mouth every 8 (eight) hours as needed for nausea or vomiting. 01/06/24  Yes Garrick Charleston, MD  pantoprazole  (PROTONIX ) 40 MG tablet Take 1 tablet (40 mg total) by mouth 2 (two) times daily. 02/17/24 03/18/24 Yes Leotis Bogus, MD  rOPINIRole  (REQUIP ) 1 MG tablet Take 1 tablet (1 mg total) by mouth at bedtime. 01/03/24  Yes [name], MD  spironolactone  (ALDACTONE ) 25 MG tablet Take 1 tablet (25 mg total) by mouth daily. 02/03/24 02/02/25 Yes Samtani, Jai-Gurmukh, MD  temazepam  (RESTORIL ) 30 MG capsule Take 1 capsule (30 mg total) by mouth at bedtime as needed for sleep. 02/13/24  Yes [name], MD  XIFAXAN  550 MG TABS tablet Take 550 mg by mouth 2 (two) times daily. 10/27/23  Yes [provider]  Continuous Glucose Sensor (DEXCOM G7 SENSOR) MISC Use 1 sensor for continuous glucose monitoring every 10 days for 30 days 05/25/23   Braulio Hough, MD    Allergies: Vancomycin , Amoxicillin, Azithromycin , Dulaglutide , Empagliflozin -metformin  hcl er, Januvia  [sitagliptin ], Latex, Metformin , Morphine , Penicillins, and Vitamin k  and related    Review of Systems  Constitutional:  Positive for  fatigue. Negative for chills, diaphoresis and fever.  HENT:  Negative for congestion, rhinorrhea and sneezing.   Eyes: Negative.   Respiratory:  Positive for cough and shortness of breath. Negative for chest tightness.   Cardiovascular:  Positive for leg swelling. Negative for chest pain.  Gastrointestinal:  Positive for abdominal pain. Negative for blood in stool, diarrhea, nausea and vomiting.  Genitourinary:  Negative for difficulty urinating, flank pain, frequency and hematuria.  Musculoskeletal:  Negative for arthralgias and back pain.  Skin:  Negative for rash.  Neurological:  Negative for dizziness, speech difficulty, weakness, numbness and headaches.  Psychiatric/Behavioral:  Positive for confusion.     Updated Vital Signs BP (!) 110/57 (BP Location: Right Arm)   Pulse 91   Temp 98.1 F (36.7 C)   Resp 16   Ht 5' 6 (1.676 m)   Wt 83.5 kg   LMP 11/13/2020 (Exact Date)   SpO2 96%   BMI 29.71 kg/m   Physical Exam Constitutional:      Appearance: She is well-developed.  HENT:     Head: Normocephalic and atraumatic.  Eyes:     Pupils: Pupils are equal, round, and reactive to light.  Cardiovascular:     Rate and Rhythm: Regular rhythm. Tachycardia present.     Heart sounds: Murmur heard.  Pulmonary:     Effort: Pulmonary effort is normal. No respiratory distress.     Breath sounds: Rales present. No wheezing.  Chest:     Chest wall: No tenderness.  Abdominal:     General: Bowel sounds are normal. There is distension.     Palpations: Abdomen is soft.     Tenderness: There is abdominal tenderness. There is no guarding or rebound.  Musculoskeletal:        General: Normal range of motion.     Cervical back: Normal range of motion and neck supple.     Right lower leg: Edema present.     Left lower leg: Edema present.     Comments: 2+ edema to lower extremities bilaterally  Lymphadenopathy:     Cervical: No cervical adenopathy.  Skin:    General: Skin is warm and  dry.     Findings: No rash.  Neurological:     Mental Status: She is alert and oriented to person, place, and time.     (all labs ordered are listed, but only abnormal results are displayed) Labs Reviewed  COMPREHENSIVE METABOLIC PANEL WITH GFR - Abnormal; Notable for the following components:      Result Value   Sodium 130 (*)    Chloride 97 (*)    CO2 20 (*)    Glucose, Bld 320 (*)    Calcium  8.4 (*)    Total Protein 6.4 (*)    Albumin  3.0 (*)    AST 44 (*)    Total Bilirubin 5.0 (*)    All other components within normal limits  CBC WITH DIFFERENTIAL/PLATELET - Abnormal; Notable for the following components:   WBC 2.6 (*)  RBC 3.12 (*)    Hemoglobin 8.7 (*)    HCT 28.0 (*)    RDW 16.9 (*)    Platelets 65 (*)    Lymphs Abs 0.3 (*)    All other components within normal limits  AMMONIA - Abnormal; Notable for the following components:   Ammonia 61 (*)    All other components within normal limits  PROTIME-INR - Abnormal; Notable for the following components:   Prothrombin Time 21.5 (*)    INR 1.8 (*)    All other components within normal limits  APTT - Abnormal; Notable for the following components:   aPTT 41 (*)    All other components within normal limits  GLUCOSE, CAPILLARY - Abnormal; Notable for the following components:   Glucose-Capillary 300 (*)    All other components within normal limits  LIPASE, BLOOD  COMPREHENSIVE METABOLIC PANEL WITH GFR  CBC  PROTIME-INR    EKG: None  Radiology: US  Abdomen Limited RUQ (LIVER/GB) Result Date: 02/19/2024 EXAM: Right Upper Quadrant Abdominal Ultrasound 02/19/2024 05:14:00 PM TECHNIQUE: Real-time ultrasonography of the right upper quadrant of the abdomen was performed. COMPARISON: CTA abdomen and pelvis 02/15/2024. CLINICAL HISTORY: RUQ pain. FINDINGS: LIVER: The liver is mildly enlarged and diffusely heterogeneous with decreased echotexture. There is nodular liver contour compatible with cirrhosis. No intrahepatic  biliary ductal dilatation. No evidence of mass. BILIARY SYSTEM: Gallbladder sludge is present. A single gallbladder polyp is present measuring 6.6 mm. Gallbladder wall thickness is upper limits of normal at 3 mm. No pericholecystic fluid. No cholelithiasis. Negative sonographic Murphy's sign. Common bile duct measures 1.6 mm. RIGHT KIDNEY: The right kidney is grossly unremarkable in appearances without evidence of hydronephrosis, echogenic calculi or worrisome mass lesions. PANCREAS: Visualized portions of the pancreas are unremarkable. OTHER: There is a large amount of ascites in the right upper quadrant. IMPRESSION: 1. Large amount of ascites in the right upper quadrant. 2. Mildly enlarged liver with nodular contour compatible with cirrhosis. 3. Gallbladder sludge and a single 6.6 mm polyp. Consider ultrasound follow-up in 12 months to assess for growth per common practice for small gallbladder polyps. Electronically signed by: Greig Pique MD 02/19/2024 05:47 PM EST RP Workstation: [id number]   DG Chest Port 1 View Result Date: 02/19/2024 EXAM: 1 VIEW(S) XRAY OF THE CHEST 02/19/2024 01:12:00 PM COMPARISON: 01/31/2024 CLINICAL HISTORY: SOB FINDINGS: LUNGS AND PLEURA: Low lung volumes with vascular crowding and bibasilar subsegmental atelectasis. No pulmonary edema. No pleural effusion. No pneumothorax. HEART AND MEDIASTINUM: Multiple wires and leads project over the chest on the frontal radiograph. No acute abnormality of the cardiac and mediastinal silhouettes. BONES AND SOFT TISSUES: No acute osseous abnormality. IMPRESSION: 1. Persistent low lung volumes with vascular crowding and bibasilar subsegmental atelectasis, unchanged from 01/31/2024. Electronically signed by: Rockey Kilts MD 02/19/2024 01:45 PM EST RP Workstation: [id number]     Procedures   Medications Ordered in the ED  rifaximin  (XIFAXAN ) tablet 550 mg (has no administration in time range)  midodrine  (PROAMATINE ) tablet 10 mg (10 mg Oral  Given 02/19/24 1818)  spironolactone  (ALDACTONE ) tablet 25 mg (has no administration in time range)  FLUoxetine  (PROZAC ) capsule 40 mg (has no administration in time range)  temazepam  (RESTORIL ) capsule 30 mg (has no administration in time range)  lactulose  (CHRONULAC ) 10 GM/15ML solution 20 g (has no administration in time range)  pantoprazole  (PROTONIX ) EC tablet 40 mg (has no administration in time range)  rOPINIRole  (REQUIP ) tablet 1 mg (has no administration in time range)  sodium chloride  flush (  NS) 0.9 % injection 3 mL (has no administration in time range)  acetaminophen  (TYLENOL ) tablet 650 mg (has no administration in time range)    Or  acetaminophen  (TYLENOL ) suppository 650 mg (has no administration in time range)  polyethylene glycol (MIRALAX  / GLYCOLAX ) packet 17 g (has no administration in time range)  guaiFENesin (ROBITUSSIN) 100 MG/5ML liquid 5 mL (has no administration in time range)  HYDROcodone -acetaminophen  (NORCO/VICODIN) 5-325 MG per tablet 1 tablet (1 tablet Oral Given 02/19/24 1634)  cefTRIAXone  (ROCEPHIN ) 2 g in sodium chloride  0.9 % 100 mL IVPB (2 g Intravenous New Bag/Given 02/19/24 1634)  insulin  aspart (novoLOG ) injection 0-5 Units (has no administration in time range)  insulin  aspart (novoLOG ) injection 0-15 Units (8 Units Subcutaneous Given 02/19/24 1859)  insulin  aspart (novoLOG ) injection 30 Units (30 Units Subcutaneous Given 02/19/24 1900)  fentaNYL  (SUBLIMAZE ) injection 50 mcg (50 mcg Intravenous Given 02/19/24 1410)                                    Medical Decision Making Amount and/or Complexity of Data Reviewed Labs: ordered. Radiology: ordered.  Risk Prescription drug management. Decision regarding hospitalization.   This patient presents to the ED for concern of abdominal distention, cough, this involves an extensive number of treatment options, and is a complaint that carries with it a high risk of complications and morbidity.  I considered the  following differential and admission for this acute, potentially life threatening condition.  The differential diagnosis includes recurrent ascites, pulmonary edema, pneumonia, SBP, worsening liver function, PE, pneumothorax  MDM:    Patient is a 43 year old who has liver cirrhosis related to NASH.  She comes in due to increased abdominal distention with leg swelling and a cough with some shortness of breath.  She said her symptoms are similar to when she has had increased fluid overload and feels like she needs a paracentesis.  She does have some abdominal pain which seems to be consistent with her prior episodes of abdominal pain when she has increased fluid.  She does not have any fever or different type of abdominal pain that would be more concerning for SPB.  She had a recent abdominal imaging and I do not feel that needs to be repeated.  Her labs are reviewed.  Her white count is a little bit on the low side but stable from prior values.  She has some pancytopenia which is stable.  She has some mild elevation in her INR which is similar to prior values.  Her ammonia level is mildly elevated.  Chest x-ray does not show any evidence of overt edema or pneumonia.  Will plan admission for paracentesis.  Discussed with Dr. Seena who will admit the patient.  (Labs, imaging, consults)  Labs: I Ordered, and personally interpreted labs.  The pertinent results include: Elevated glucose, pancytopenia, mildly elevated INR, PTT, ammonia  Imaging Studies ordered: I ordered imaging studies including chest x-ray I independently visualized and interpreted imaging. I agree with the radiologist interpretation  Additional history obtained from husband at bedside.  External records from outside source obtained and reviewed including history  Cardiac Monitoring: The patient was maintained on a cardiac monitor.  If on the cardiac monitor, I personally viewed and interpreted the cardiac monitored which showed an  underlying rhythm of: Sinus rhythm  Reevaluation: After the interventions noted above, I reevaluated the patient and found that they have :improved  Social  Determinants of Health:    Disposition: Admit to hospital  Co morbidities that complicate the patient evaluation  Past Medical History:  Diagnosis Date   ADHD    Allergy    Anemia    IRON TRANSFUSION 07-2020 NONE SINCE   Anxiety    Back pain    COVID 08/12/2019   ALL SYMPTOMS REOLVED PER PT   Depression    Diabetic neuropathy (HCC) 11/11/2020   FEET   dm type 2    sees Dr. Odella Jacobson at [facility]   Fatty liver    GERD (gastroesophageal reflux disease)    History of kidney stones    Joint pain    Menorrhagia 11/11/2020   Migraines    Murmur, cardiac    FAINT NO CARDIOLOGIST   Other fatigue    Pneumonia 08/12/2019   COVID PNEUMONIA ALL SYMPTOMS RESOLVED PER PT   Sepsis (HCC) 10/04/2023   Shortness of breath on exertion      Medicines Meds ordered this encounter  Medications   fentaNYL  (SUBLIMAZE ) injection 50 mcg   rifaximin  (XIFAXAN ) tablet 550 mg   midodrine  (PROAMATINE ) tablet 10 mg   spironolactone  (ALDACTONE ) tablet 25 mg   FLUoxetine  (PROZAC ) capsule 40 mg   temazepam  (RESTORIL ) capsule 30 mg   lactulose  (CHRONULAC ) 10 GM/15ML solution 20 g   pantoprazole  (PROTONIX ) EC tablet 40 mg   rOPINIRole  (REQUIP ) tablet 1 mg   sodium chloride  flush (NS) 0.9 % injection 3 mL   OR Linked Order Group    acetaminophen  (TYLENOL ) tablet 650 mg    acetaminophen  (TYLENOL ) suppository 650 mg   polyethylene glycol (MIRALAX  / GLYCOLAX ) packet 17 g   guaiFENesin (ROBITUSSIN) 100 MG/5ML liquid 5 mL   HYDROcodone -acetaminophen  (NORCO/VICODIN) 5-325 MG per tablet 1 tablet    Refill:  0   cefTRIAXone  (ROCEPHIN ) 2 g in sodium chloride  0.9 % 100 mL IVPB    Antibiotic Indication::   Intra-abdominal   insulin  aspart (novoLOG ) injection 0-5 Units    Correction coverage::   HS scale    CBG < 70::   Implement  Hypoglycemia Standing Orders and refer to Hypoglycemia Standing Orders sidebar report    CBG 70 - 120::   0 units    CBG 121 - 150::   0 units    CBG 151 - 200::   0 units    CBG 201 - 250::   2 units    CBG 251 - 300::   3 units    CBG 301 - 350::   4 units    CBG 351 - 400::   5 units    CBG > 400:   call MD and obtain STAT lab verification   insulin  aspart (novoLOG ) injection 0-15 Units    Correction coverage::   Moderate (average weight, post-op)    CBG < 70::   Implement Hypoglycemia Standing Orders and refer to Hypoglycemia Standing Orders sidebar report    CBG 70 - 120::   0 units    CBG 121 - 150::   2 units    CBG 151 - 200::   3 units    CBG 201 - 250::   5 units    CBG 251 - 300::   8 units    CBG 301 - 350::   11 units    CBG 351 - 400::   15 units    CBG > 400:   call MD and obtain STAT lab verification   DISCONTD: insulin  aspart (novoLOG ) injection  20 Units   insulin  aspart (novoLOG ) injection 30 Units    I have reviewed the patients home medicines and have made adjustments as needed  Problem List / ED Course: Problem List Items Addressed This Visit       Other   Ascites - Primary   Other Visit Diagnoses       Cirrhosis of liver with ascites, unspecified hepatic cirrhosis type (HCC)                    Final diagnoses:  Other ascites  Cirrhosis of liver with ascites, unspecified hepatic cirrhosis type [facility])    ED Discharge Orders     None          Lenor Hollering, MD 02/19/24 2027

## 2024-02-19 NOTE — Telephone Encounter (Signed)
 Patient's husband called stating that patient was discharged from hospital on Friday had paracentesis on Wednesday and removed 7.4L of fluid. Patient is now short of breath and slight chest pain with dry hacking cough causing intermittent vomiting. Patient and husband feel that ascites has returned. Denies fevers temperature running at 99.5. He states she has some slight confusion today. Remains on lactulose , spironactolone, and xifaxin with approximately 2-3 bowel movements daily. Advised that patient should go to ER locally at [facility] given consistent dry cough and shortness of breath. Advised that if they feel she can withstand the drive to [facility] main she should come here, but on further discussion husband will take her to [facility]. Stated that if they feel they need to transfer her they will call through the PCL line. Will notify on call hepatology as well.

## 2024-02-19 NOTE — Plan of Care (Signed)

## 2024-02-19 NOTE — ED Triage Notes (Addendum)
 Patient has NASH and ongoing issues with nausea/vomiting and ascites. Just released from hospital 2 days ago. Had hemoptysis and fluid overload per spouse. Patient vomiting on arrival-care normally at [facility]

## 2024-02-20 ENCOUNTER — Observation Stay (HOSPITAL_COMMUNITY)

## 2024-02-20 DIAGNOSIS — K219 Gastro-esophageal reflux disease without esophagitis: Secondary | ICD-10-CM | POA: Diagnosis not present

## 2024-02-20 DIAGNOSIS — K7469 Other cirrhosis of liver: Secondary | ICD-10-CM

## 2024-02-20 DIAGNOSIS — D61818 Other pancytopenia: Secondary | ICD-10-CM | POA: Diagnosis not present

## 2024-02-20 DIAGNOSIS — R188 Other ascites: Secondary | ICD-10-CM | POA: Diagnosis not present

## 2024-02-20 DIAGNOSIS — K7581 Nonalcoholic steatohepatitis (NASH): Secondary | ICD-10-CM

## 2024-02-20 DIAGNOSIS — F419 Anxiety disorder, unspecified: Secondary | ICD-10-CM | POA: Diagnosis not present

## 2024-02-20 HISTORY — PX: IR PARACENTESIS: IMG2679

## 2024-02-20 LAB — BPAM RBC
Blood Product Expiration Date: 202511272359
Blood Product Expiration Date: 202512032359
Blood Product Expiration Date: 202512032359
Blood Product Expiration Date: 202512032359
Blood Product Expiration Date: 202512042359
ISSUE DATE / TIME: 202511061711
ISSUE DATE / TIME: 202511071645
ISSUE DATE / TIME: 202511071659
ISSUE DATE / TIME: 202511091828
Unit Type and Rh: 5100
Unit Type and Rh: 5100
Unit Type and Rh: 5100
Unit Type and Rh: 5100
Unit Type and Rh: 5100

## 2024-02-20 LAB — COMPREHENSIVE METABOLIC PANEL WITH GFR
ALT: 23 U/L (ref 0–44)
AST: 38 U/L (ref 15–41)
Albumin: 2.8 g/dL — ABNORMAL LOW (ref 3.5–5.0)
Alkaline Phosphatase: 90 U/L (ref 38–126)
Anion gap: 9 (ref 5–15)
BUN: 8 mg/dL (ref 6–20)
CO2: 24 mmol/L (ref 22–32)
Calcium: 8.3 mg/dL — ABNORMAL LOW (ref 8.9–10.3)
Chloride: 98 mmol/L (ref 98–111)
Creatinine, Ser: 0.75 mg/dL (ref 0.44–1.00)
GFR, Estimated: >60 mL/min (ref >60)
Glucose, Bld: 221 mg/dL — ABNORMAL HIGH (ref 70–99)
Potassium: 3.8 mmol/L (ref 3.5–5.1)
Sodium: 131 mmol/L — ABNORMAL LOW (ref 135–145)
Total Bilirubin: 3.8 mg/dL — ABNORMAL HIGH (ref 0.0–1.2)
Total Protein: 5.9 g/dL — ABNORMAL LOW (ref 6.5–8.1)

## 2024-02-20 LAB — TYPE AND SCREEN
ABO/RH(D): O POS
ABO/RH(D): O POS
Antibody Screen: NEGATIVE
Antibody Screen: NEGATIVE
Unit division: 0
Unit division: 0
Unit division: 0
Unit division: 0
Unit division: 0

## 2024-02-20 LAB — GLUCOSE, CAPILLARY
Glucose-Capillary: 213 mg/dL — ABNORMAL HIGH (ref 70–99)
Glucose-Capillary: 224 mg/dL — ABNORMAL HIGH (ref 70–99)
Glucose-Capillary: 307 mg/dL — ABNORMAL HIGH (ref 70–99)
Glucose-Capillary: 202 mg/dL — ABNORMAL HIGH (ref 70–99)
Glucose-Capillary: 227 mg/dL — ABNORMAL HIGH (ref 70–99)

## 2024-02-20 LAB — CBC
HCT: 25.3 % — ABNORMAL LOW (ref 36.0–46.0)
Hemoglobin: 8.1 g/dL — ABNORMAL LOW (ref 12.0–15.0)
MCH: 28 pg (ref 26.0–34.0)
MCHC: 32 g/dL (ref 30.0–36.0)
MCV: 87.5 fL (ref 80.0–100.0)
Platelets: 61 K/uL — ABNORMAL LOW (ref 150–400)
RBC: 2.89 MIL/uL — ABNORMAL LOW (ref 3.87–5.11)
RDW: 16.8 % — ABNORMAL HIGH (ref 11.5–15.5)
WBC: 2.5 K/uL — ABNORMAL LOW (ref 4.0–10.5)
nRBC: 0 % (ref 0.0–0.2)

## 2024-02-20 LAB — CULTURE, BLOOD (ROUTINE X 2)
Culture: NO GROWTH
Culture: NO GROWTH
Special Requests: ADEQUATE
Special Requests: ADEQUATE

## 2024-02-20 LAB — PROTIME-INR
INR: 1.8 — ABNORMAL HIGH (ref 0.8–1.2)
Prothrombin Time: 21.7 s — ABNORMAL HIGH (ref 11.4–15.2)

## 2024-02-20 LAB — BODY FLUID CELL COUNT WITH DIFFERENTIAL
Eos, Fluid: 0 %
Lymphs, Fluid: 59 %
Monocyte-Macrophage-Serous Fluid: 38 % — ABNORMAL LOW (ref 50–90)
Neutrophil Count, Fluid: 3 % (ref 0–25)
Total Nucleated Cell Count, Fluid: 49 uL (ref 0–1000)

## 2024-02-20 LAB — AMMONIA: Ammonia: 34 umol/L (ref 9–35)

## 2024-02-20 MED ORDER — HYDROCODONE-ACETAMINOPHEN 5-325 MG PO TABS
1.0000 | ORAL_TABLET | Freq: Four times a day (QID) | ORAL | Status: DC | PRN
  Administered 2024-02-20 – 2024-02-21 (×4): 1 via ORAL
  Filled 2024-02-20 (×4): qty 1

## 2024-02-20 MED ORDER — LIDOCAINE-EPINEPHRINE 1 %-1:100000 IJ SOLN
INTRAMUSCULAR | Status: AC
  Filled 2024-02-20: qty 1

## 2024-02-20 MED ORDER — INSULIN GLARGINE-YFGN 100 UNIT/ML ~~LOC~~ SOLN
10.0000 [IU] | SUBCUTANEOUS | Status: DC
  Administered 2024-02-20: 10 [IU] via SUBCUTANEOUS
  Filled 2024-02-20 (×3): qty 0.1

## 2024-02-20 MED ORDER — FUROSEMIDE 10 MG/ML IJ SOLN
40.0000 mg | Freq: Once | INTRAMUSCULAR | Status: AC
  Administered 2024-02-20: 40 mg via INTRAVENOUS
  Filled 2024-02-20: qty 4

## 2024-02-20 MED ORDER — SPIRONOLACTONE 25 MG PO TABS
50.0000 mg | ORAL_TABLET | Freq: Every day | ORAL | Status: DC
  Administered 2024-02-21: 50 mg via ORAL
  Filled 2024-02-20: qty 2

## 2024-02-20 MED ORDER — FUROSEMIDE 20 MG PO TABS
20.0000 mg | ORAL_TABLET | Freq: Every day | ORAL | Status: DC
  Administered 2024-02-21: 20 mg via ORAL
  Filled 2024-02-20: qty 1

## 2024-02-20 MED ORDER — LIDOCAINE-EPINEPHRINE 1 %-1:100000 IJ SOLN
20.0000 mL | Freq: Once | INTRAMUSCULAR | Status: AC
  Administered 2024-02-20: 20 mL via INTRADERMAL

## 2024-02-20 NOTE — Procedures (Signed)
Ultrasound-guided diagnostic and therapeutic paracentesis performed yielding 8 liters of straw colored fluid.  Fluid was sent to lab for analysis. No immediate complications. EBL is none.  

## 2024-02-20 NOTE — Hospital Course (Addendum)
 Laurie Sutton is a 43 y.o. female with medical history significant of diabetes, GERD, QT prolongation, anxiety, depression, NASH cirrhosis with ascites and portal hypertension and history of hepatic encephalopathy, history of SBP, RLS, neuropathy, pancytopenia, obesity, back pain presenting with nausea vomiting abdominal distention/discomfort.   Patient has known history of [name] with ascites as above.  Her ascites is recurrent and she gets weekly paracenteses.  Last paracentesis was 4 days ago and she was admitted at that time from November 5-November 7 for concurrent hematemesis.  CT was negative for acute bleeding and EGD by GI showed no acute bleeding and no stigmata of recent bleeding of her varices.  Patient was discharged in stable condition.   Patient has noted a more rapid accumulation of ascites with typical abdominal discomfort, but also some additional sharp pains in her right abdomen worse with coughs.  Called her transplant team where she is being evaluated at [facility] and they recommended evaluation in the ED.   Admitted for abdominal pain and decompensated liver cirrhosis and received a Paracentesis. Also getting IV furosemide . Will continue Abx, Obtain GI evaluation and continue 2 gram Laurie+ diet.   Assessment and Plan:  Decompensated Cirrhosis w/ Ascites, Portal HTN, Hx of SBP, Esophageal Varices and Hx of Hepatic Encephalopathy: Has Known NASH cirrhosis with weekly paracenteses.  Recent admission 11/5-11/7 for hematemesis with negative CT and negative EGD. -Now with more rapid accumulation of fluid and is typical.  Having abdominal distention and discomfort as well as nausea and vomiting.  No fevers and no change in white count.  Increased tenderness at the right side of her abdomen on palpation compared to typical. -Was Unable to get paracentesis by IR on a nonemergent basis on the weekend but underwent IR Paracentesis this AM and yielded 8 liters of Straw colored fluid. CTM   on telemetry overnight -C/w IV Ceftriaxone  as below -Right upper quadrant ultrasound given recent CT showing evidence of gallstones and possible cholecystitis and it showed a Large amount of ascites in the right upper quadrant, Mildly enlarged liver with nodular contour compatible with cirrhosis, and Gallbladder sludge and a single 6.6 mm polyp. Current Recommendation is to Consider ultrasound follow-up in 12 months to assess for growth per common practice for small gallbladder polyps. -Will give her a dose of IV Furosemide  40 mg x1 and order TED hose.  -Continue home Midodrine  10 mg po BID and Spironolactone  will be increased from 25 mg to 50 mg po daily;  -Continue home Lactulose  20 grams po BID and Rifaximin  550 mg po BID; Ammonia Lvl went from 61 -> 34.  -Continue home PPI as below -Trend LFTs, PT/INR daily; calculated MELD-Laurie to be 23 today  -Continue 2 gram Laurie+ Diet   Diabetes Mellitus Type 2: She is on 60-65 units Humulin  R 3 times daily with meals at home; Currently on 30 Units Novolog  TID with Meals and on Moderate Novolog  SSI AC/HS; Will add Semglee  10 units sq Daily. CTM CBGs per Protocol:  Recent Labs  Lab 02/17/24 0324 02/17/24 0817 02/19/24 1820 02/19/24 2103 02/20/24 0604 02/20/24 1235 02/20/24 1649  GLUCAP 210* 232* 300* 289* 213* 224* 307*   Recent UTI: Has been On Macrobid  in the outpatient setting and on IV Ceftriaxone  while admitted recently. Currently Back on ceftriaxone  for now with increased tenderness of her abdomen with concern for SBP however this was ruled out with the paracentesis.   RLS Continue home Ropinirole  1 mg po qHS  Anxiety and Depression:  Continue Fluoxetine  40 mg po BID and Temazepam  30 mg po qHS   HypoNa+: Laurie+ Trend:  Recent Labs  Lab 02/10/24 0015 02/15/24 2152 02/15/24 2215 02/16/24 0549 02/17/24 0333 02/19/24 1304 02/20/24 0604  Laurie 128* 132* 136 135 131* 130* 131*  -In the setting of Cirrhosis; increased Spironolactone  to 50 mg  Daily; On IV Furosemide  today and transition to po in the AM  Pancytopenia: CBC Trend:  Recent Labs  Lab 02/03/24 0410 02/10/24 0015 02/15/24 2152 02/15/24 2215 02/16/24 0549 02/16/24 1511 02/17/24 0333 02/17/24 0839 02/19/24 1304 02/20/24 0604  WBC 1.7* 2.1* 1.9*  --  1.9*  --  2.1*  --  2.6* 2.5*  HGB 7.7* 8.4* 8.2*   < > 7.5*   < > 8.8*   < > 8.7* 8.1*  HCT 24.2* 26.5* 26.3*   < > 24.0*   < > 27.7*   < > 28.0* 25.3*  MCV 90.3 88.6 90.4  --  89.2  --  88.2  --  89.7 87.5  PLT 60* 61* 66*  --  62*  --  67*  --  65* 61*   < > = values in this interval not displayed.   GERD/GI Prophylaxis: Continue PPI w/ Pantoprazole  40 mg po BID   Hypoalbuminemia: Patient's Albumin  Lvl ranging between 2.8-3.7. CTM & Trend & repeat CMP in the AM   Overweight: Complicates overall prognosis and care. Estimated body mass index is 29.71 kg/m as calculated from the following:   Height as of this encounter: 5' 6 (1.676 m).   Weight as of this encounter: 83.5 kg. Weight Loss and Dietary Counseling given

## 2024-02-20 NOTE — Progress Notes (Signed)
 PROGRESS NOTE    Laurie Sutton  FMW:[id number] DOB: [birth year] DOA: 02/19/2024 PCP: [name], MD   Brief Narrative:  Laurie Sutton is a 43 y.o. female with medical history significant of diabetes, GERD, QT prolongation, anxiety, depression, NASH cirrhosis with ascites and portal hypertension and history of hepatic encephalopathy, history of SBP, RLS, neuropathy, pancytopenia, obesity, back pain presenting with nausea vomiting abdominal distention/discomfort.   Patient has known history of [name] with ascites as above.  Her ascites is recurrent and she gets weekly paracenteses.  Last paracentesis was 4 days ago and she was admitted at that time from November 5-November 7 for concurrent hematemesis.  CT was negative for acute bleeding and EGD by GI showed no acute bleeding and no stigmata of recent bleeding of her varices.  Patient was discharged in stable condition.   Patient has noted a more rapid accumulation of ascites with typical abdominal discomfort, but also some additional sharp pains in her right abdomen worse with coughs.  Called her transplant team where she is being evaluated at [facility] and they recommended evaluation in the ED.   Admitted for abdominal pain and decompensated liver cirrhosis and received a Paracentesis. Also getting IV furosemide . Will continue Abx, Obtain GI evaluation and continue 2 gram Na+ diet.   Assessment and Plan:  Decompensated Cirrhosis w/ Ascites, Portal HTN, Hx of SBP, Esophageal Varices and Hx of Hepatic Encephalopathy: Has Known NASH cirrhosis with weekly paracenteses.  Recent admission 11/5-11/7 for hematemesis with negative CT and negative EGD. -Now with more rapid accumulation of fluid and is typical.  Having abdominal distention and discomfort as well as nausea and vomiting.  No fevers and no change in white count.  Increased tenderness at the right side of her abdomen on palpation compared to typical. -Was Unable to get  paracentesis by IR on a nonemergent basis on the weekend but underwent IR Paracentesis this AM and yielded 8 liters of Straw colored fluid. CTM  on telemetry overnight -C/w IV Ceftriaxone  as below -Right upper quadrant ultrasound given recent CT showing evidence of gallstones and possible cholecystitis and it showed a Large amount of ascites in the right upper quadrant, Mildly enlarged liver with nodular contour compatible with cirrhosis, and Gallbladder sludge and a single 6.6 mm polyp. Current Recommendation is to Consider ultrasound follow-up in 12 months to assess for growth per common practice for small gallbladder polyps. -Will give her a dose of IV Furosemide  40 mg x1 and order TED hose.  -Continue home Midodrine  10 mg po BID and Spironolactone  will be increased from 25 mg to 50 mg po daily;  -Continue home Lactulose  20 grams po BID and Rifaximin  550 mg po BID; Ammonia Lvl went from 61 -> 34.  -Continue home PPI as below -Trend LFTs, PT/INR daily; calculated MELD-Na to be 23 today  -Continue 2 gram Na+ Diet   Diabetes Mellitus Type 2: She is on 60-65 units Humulin  R 3 times daily with meals at home; Currently on 30 Units Novolog  TID with Meals and on Moderate Novolog  SSI AC/HS; Will add Semglee  10 units sq Daily. CTM CBGs per Protocol:  Recent Labs  Lab 02/17/24 0324 02/17/24 0817 02/19/24 1820 02/19/24 2103 02/20/24 0604 02/20/24 1235 02/20/24 1649  GLUCAP 210* 232* 300* 289* 213* 224* 307*   Recent UTI: Has been On Macrobid  in the outpatient setting and on IV Ceftriaxone  while admitted recently. Currently Back on ceftriaxone  for now with increased tenderness of her abdomen with concern for  SBP however this was ruled out with the paracentesis.   RLS Continue home Ropinirole  1 mg po qHS  Anxiety and Depression:  Continue Fluoxetine  40 mg po BID and Temazepam  30 mg po qHS   HypoNa+: Na+ Trend:  Recent Labs  Lab 02/10/24 0015 02/15/24 2152 02/15/24 2215 02/16/24 0549  02/17/24 0333 02/19/24 1304 02/20/24 0604  NA 128* 132* 136 135 131* 130* 131*  -In the setting of Cirrhosis; increased Spironolactone  to 50 mg Daily; On IV Furosemide  today and transition to po in the AM  Pancytopenia: CBC Trend:  Recent Labs  Lab 02/03/24 0410 02/10/24 0015 02/15/24 2152 02/15/24 2215 02/16/24 0549 02/16/24 1511 02/17/24 0333 02/17/24 0839 02/19/24 1304 02/20/24 0604  WBC 1.7* 2.1* 1.9*  --  1.9*  --  2.1*  --  2.6* 2.5*  HGB 7.7* 8.4* 8.2*   < > 7.5*   < > 8.8*   < > 8.7* 8.1*  HCT 24.2* 26.5* 26.3*   < > 24.0*   < > 27.7*   < > 28.0* 25.3*  MCV 90.3 88.6 90.4  --  89.2  --  88.2  --  89.7 87.5  PLT 60* 61* 66*  --  62*  --  67*  --  65* 61*   < > = values in this interval not displayed.   GERD/GI Prophylaxis: Continue PPI w/ Pantoprazole  40 mg po BID   Hypoalbuminemia: Patient's Albumin  Lvl ranging between 2.8-3.7. CTM & Trend & repeat CMP in the AM   Overweight: Complicates overall prognosis and care. Estimated body mass index is 29.71 kg/m as calculated from the following:   Height as of this encounter: 5' 6 (1.676 m).   Weight as of this encounter: 83.5 kg. Weight Loss and Dietary Counseling given   DVT prophylaxis: Place TED hose Start: 02/20/24 1742 SCDs Start: 02/19/24 1539    Code Status: Full Code Family Communication: No family present @ bedside   Disposition Plan:  Level of care: Telemetry Status is: Observation The patient remains OBS appropriate and will d/c before 2 midnights.   Consultants:  Will consult GI in the AM  Procedures:  Paracentesis - Yielding 8 liters   Antimicrobials:  Anti-infectives (From admission, onward)    Start     Dose/Rate Route Frequency Ordered Stop   02/19/24 2200  rifaximin  (XIFAXAN ) tablet 550 mg        550 mg Oral 2 times daily 02/19/24 1553     02/19/24 1630  cefTRIAXone  (ROCEPHIN ) 2 g in sodium chloride  0.9 % 100 mL IVPB        2 g 200 mL/hr over 30 Minutes Intravenous Every 24 hours  02/19/24 1621         Subjective: Seen and examined at bedside after her paracentesis and she is still having some abdominal discomfort.  No nausea or vomiting.  Patient states that this a.m. yielded 8 L.  Thinks her abdomen is not as tight and not as tender.  No nausea or vomiting but feels fatigued.  No other concerns or complaints at this time.  Objective: Vitals:   02/19/24 1947 02/20/24 0433 02/20/24 0727 02/20/24 1604  BP: (!) 110/57 (!) 106/45 (!) 100/48 (!) 102/56  Pulse: 91 97 96 81  Resp: 16 16 17 15   Temp: 98.1 F (36.7 C) 98.4 F (36.9 C) 98.4 F (36.9 C) 98.6 F (37 C)  TempSrc:  Oral    SpO2: 96% 96% 96% 96%  Weight:  Height:        Intake/Output Summary (Last 24 hours) at 02/20/2024 1756 Last data filed at 02/20/2024 1744 Gross per 24 hour  Intake 840 ml  Output --  Net 840 ml   Filed Weights   02/19/24 1757  Weight: 83.5 kg   Examination: Physical Exam:  Constitutional: WN/WD, overweight chronically ill-appearing Caucasian female Respiratory: Diminished to auscultation bilaterally, no wheezing, rales, rhonchi or crackles. Normal respiratory effort and patient is not tachypenic. No accessory muscle use.  Unlabored breathing Cardiovascular: RRR, no murmurs / rubs / gallops. S1 and S2 auscultated.  1+ extremity edema Abdomen: Soft, mildly tender to palpation distended secondary to body habitus and ascites. Bowel sounds positive.  GU: Deferred. Musculoskeletal: No clubbing / cyanosis of digits/nails. No joint deformity upper and lower extremities.  Skin: No rashes, lesions, ulcers on limited skin evaluation. No induration; Warm and dry.  Neurologic: CN 2-12 grossly intact with no focal deficits. Romberg sign and cerebellar reflexes not assessed.  Psychiatric: Normal judgment and insight. Alert and oriented x 3. Normal mood and appropriate affect.   Data Reviewed: I have personally reviewed following labs and imaging studies  CBC: Recent Labs  Lab  02/15/24 2152 02/15/24 2215 02/16/24 0549 02/16/24 1511 02/16/24 2156 02/17/24 0333 02/17/24 0839 02/19/24 1304 02/20/24 0604  WBC 1.9*  --  1.9*  --   --  2.1*  --  2.6* 2.5*  NEUTROABS  --   --   --   --   --   --   --  1.9  --   HGB 8.2*   < > 7.5*   < > 9.0* 8.8* 8.4* 8.7* 8.1*  HCT 26.3*   < > 24.0*   < > 28.3* 27.7* 26.3* 28.0* 25.3*  MCV 90.4  --  89.2  --   --  88.2  --  89.7 87.5  PLT 66*  --  62*  --   --  67*  --  65* 61*   < > = values in this interval not displayed.   Basic Metabolic Panel: Recent Labs  Lab 02/15/24 2152 02/15/24 2215 02/16/24 0549 02/17/24 0333 02/19/24 1304 02/20/24 0604  NA 132* 136 135 131* 130* 131*  K 3.4* 3.4* 3.0* 4.1 4.1 3.8  CL 98 97* 100 99 97* 98  CO2 23  --  24 21* 20* 24  GLUCOSE 286* 281* 92 231* 320* 221*  BUN 6 5* 6 6 7 8   CREATININE 0.94 0.60 1.50* 0.65 0.86 0.75  CALCIUM  8.8*  --  8.4* 8.2* 8.4* 8.3*  MG  --   --   --  1.7  --   --   PHOS  --   --   --  3.0  --   --    GFR: Estimated Creatinine Clearance: 98.8 mL/min (by C-G formula based on SCr of 0.75 mg/dL). Liver Function Tests: Recent Labs  Lab 02/15/24 2152 02/17/24 0333 02/19/24 1304 02/20/24 0604  AST 50* 55* 44* 38  ALT 26 26 25 23   ALKPHOS 110 78 88 90  BILITOT 4.6* 4.0* 5.0* 3.8*  PROT 7.0 6.1* 6.4* 5.9*  ALBUMIN  3.7 3.0* 3.0* 2.8*   Recent Labs  Lab 02/15/24 2152 02/19/24 1304  LIPASE 51 37   Recent Labs  Lab 02/15/24 2152 02/19/24 1341 02/20/24 1123  AMMONIA 92* 61* 34   Coagulation Profile: Recent Labs  Lab 02/15/24 2152 02/16/24 0549 02/17/24 0333 02/19/24 1304 02/20/24 0604  INR 1.8* 1.9* 1.9* 1.8*  1.8*   Cardiac Enzymes: No results for input(s): CKTOTAL, CKMB, CKMBINDEX, TROPONINI in the last 168 hours. BNP (last 3 results) No results for input(s): PROBNP in the last 8760 hours. HbA1C: No results for input(s): HGBA1C in the last 72 hours. CBG: Recent Labs  Lab 02/19/24 1820 02/19/24 2103 02/20/24 0604  02/20/24 1235 02/20/24 1649  GLUCAP 300* 289* 213* 224* 307*   Lipid Profile: No results for input(s): CHOL, HDL, LDLCALC, TRIG, CHOLHDL, LDLDIRECT in the last 72 hours. Thyroid  Function Tests: No results for input(s): TSH, T4TOTAL, FREET4, T3FREE, THYROIDAB in the last 72 hours. Anemia Panel: No results for input(s): VITAMINB12, FOLATE, FERRITIN, TIBC, IRON, RETICCTPCT in the last 72 hours. Sepsis Labs: Recent Labs  Lab 02/15/24 2203 02/16/24 0213  LATICACIDVEN 3.4* 1.3   Recent Results (from the past 240 hours)  Culture, Urine     Status: None   Collection Time: 02/13/24 11:14 AM   Specimen: Urine  Result Value Ref Range Status   MICRO NUMBER: [id number]  Final   SPECIMEN QUALITY: Adequate  Final   Sample Source URINE  Final   STATUS: FINAL  Final   Result: No Growth  Final  Blood culture (routine x 2)     Status: None   Collection Time: 02/15/24  9:52 PM   Specimen: BLOOD RIGHT FOREARM  Result Value Ref Range Status   Specimen Description BLOOD RIGHT FOREARM  Final   Special Requests   Final    BOTTLES DRAWN AEROBIC AND ANAEROBIC Blood Culture adequate volume   Culture   Final    NO GROWTH 5 DAYS Performed at [facility], [address], [place], [place] [address]    Report Status 02/20/2024 FINAL  Final  Blood culture (routine x 2)     Status: None   Collection Time: 02/15/24  9:52 PM   Specimen: BLOOD LEFT ARM  Result Value Ref Range Status   Specimen Description BLOOD LEFT ARM  Final   Special Requests   Final    BOTTLES DRAWN AEROBIC ONLY Blood Culture adequate volume   Culture   Final    NO GROWTH 5 DAYS Performed at [facility], [address], [place], [place] [address]    Report Status 02/20/2024 FINAL  Final  Body fluid culture w Gram Stain     Status: None (Preliminary result)   Collection Time: 02/20/24  9:18 AM   Specimen: Abdomen; Peritoneal Fluid  Result Value Ref Range Status   Specimen Description  PERITONEAL  Final   Special Requests ABDOMEN  Final   Gram Stain   Final    NO WBC SEEN NO ORGANISMS SEEN Performed at [facility], [address] [place], [place] [address]    Culture PENDING  Incomplete   Report Status PENDING  Incomplete    Radiology Studies: IR Paracentesis Result Date: 02/20/2024 INDICATION: 43 year old female. History of NASH cirrhosis with recurrent ascites. Request is for therapeutic and diagnostic paracentesis EXAM: ULTRASOUND GUIDED THERAPEUTIC DIAGNOSTIC LEFT-SIDED PARACENTESIS MEDICATIONS: Lidocaine  1% 10 mL COMPLICATIONS: None immediate. PROCEDURE: Informed written consent was obtained from the patient after a discussion of the risks, benefits and alternatives to treatment. A timeout was performed prior to the initiation of the procedure. Initial ultrasound scanning demonstrates a large amount of ascites within the left lower abdominal quadrant. The left lower abdomen was prepped and draped in the usual sterile fashion. 1% lidocaine  was used for local anesthesia. Following this, a 19 gauge, 7-cm, Yueh catheter  was introduced. An ultrasound image was saved for documentation purposes. The paracentesis was performed. The catheter was removed and a dressing was applied. The patient tolerated the procedure well without immediate post procedural complication. Patient received post-procedure intravenous albumin ; see nursing notes for details. FINDINGS: A total of approximately 8 L of straw-colored fluid was removed. Samples were sent to the laboratory as requested by the clinical team. IMPRESSION: Successful ultrasound-guided therapeutic and diagnostic left-sided paracentesis yielding 8 liters of peritoneal fluid. Performed by Delon Beagle NP PLAN: Patient is currently being worked up for transplant at [facility] Electronically Signed   By: Cordella Banner   On: 02/20/2024 17:08   US  Abdomen Limited RUQ (LIVER/GB) Result Date: 02/19/2024 EXAM: Right  Upper Quadrant Abdominal Ultrasound 02/19/2024 05:14:00 PM TECHNIQUE: Real-time ultrasonography of the right upper quadrant of the abdomen was performed. COMPARISON: CTA abdomen and pelvis 02/15/2024. CLINICAL HISTORY: RUQ pain. FINDINGS: LIVER: The liver is mildly enlarged and diffusely heterogeneous with decreased echotexture. There is nodular liver contour compatible with cirrhosis. No intrahepatic biliary ductal dilatation. No evidence of mass. BILIARY SYSTEM: Gallbladder sludge is present. A single gallbladder polyp is present measuring 6.6 mm. Gallbladder wall thickness is upper limits of normal at 3 mm. No pericholecystic fluid. No cholelithiasis. Negative sonographic Murphy's sign. Common bile duct measures 1.6 mm. RIGHT KIDNEY: The right kidney is grossly unremarkable in appearances without evidence of hydronephrosis, echogenic calculi or worrisome mass lesions. PANCREAS: Visualized portions of the pancreas are unremarkable. OTHER: There is a large amount of ascites in the right upper quadrant. IMPRESSION: 1. Large amount of ascites in the right upper quadrant. 2. Mildly enlarged liver with nodular contour compatible with cirrhosis. 3. Gallbladder sludge and a single 6.6 mm polyp. Consider ultrasound follow-up in 12 months to assess for growth per common practice for small gallbladder polyps. Electronically signed by: Greig Pique MD 02/19/2024 05:47 PM EST RP Workstation: [id number]   DG Chest Port 1 View Result Date: 02/19/2024 EXAM: 1 VIEW(S) XRAY OF THE CHEST 02/19/2024 01:12:00 PM COMPARISON: 01/31/2024 CLINICAL HISTORY: SOB FINDINGS: LUNGS AND PLEURA: Low lung volumes with vascular crowding and bibasilar subsegmental atelectasis. No pulmonary edema. No pleural effusion. No pneumothorax. HEART AND MEDIASTINUM: Multiple wires and leads project over the chest on the frontal radiograph. No acute abnormality of the cardiac and mediastinal silhouettes. BONES AND SOFT TISSUES: No acute osseous  abnormality. IMPRESSION: 1. Persistent low lung volumes with vascular crowding and bibasilar subsegmental atelectasis, unchanged from 01/31/2024. Electronically signed by: Rockey Kilts MD 02/19/2024 01:45 PM EST RP Workstation: [id number]   Scheduled Meds:  FLUoxetine   40 mg Oral BID   furosemide   40 mg Intravenous Once   insulin  aspart  0-15 Units Subcutaneous TID WC   insulin  aspart  0-5 Units Subcutaneous QHS   insulin  aspart  30 Units Subcutaneous TID WC   insulin  glargine-yfgn  10 Units Subcutaneous Q24H   lactulose   20 g Oral BID   midodrine   10 mg Oral BID WC   pantoprazole   40 mg Oral BID   rifaximin   550 mg Oral BID   rOPINIRole   1 mg Oral QHS   sodium chloride  flush  3 mL Intravenous Q12H   [START ON 02/21/2024] spironolactone   50 mg Oral Daily   Continuous Infusions:  cefTRIAXone  (ROCEPHIN )  IV 2 g (02/19/24 1634)    LOS: 0 days   Alejandro Marker, DO [facility] Available via Epic secure chat 7am-7pm After these hours, please refer to coverage provider listed on [email or web address] 02/20/2024,  5:56 PM

## 2024-02-20 NOTE — Inpatient Diabetes Management (Signed)
 Inpatient Diabetes Program Recommendations  AACE/ADA: New Consensus Statement on Inpatient Glycemic Control (2015)  Target Ranges:  Prepandial:   less than 140 mg/dL      Peak postprandial:   less than 180 mg/dL (1-2 hours)      Critically ill patients:  140 - 180 mg/dL   Lab Results  Component Value Date   GLUCAP 213 (H) 02/20/2024   HGBA1C 6.7 (A) 01/03/2024    Latest Reference Range & Units 02/19/24 18:20 02/19/24 21:03 02/20/24 06:04  Glucose-Capillary 70 - 99 mg/dL 699 (H) 710 (H) 786 (H)   Review of Glycemic Control  Diabetes history: DM2  Outpatient Diabetes medications:  Dexcom G7 CGM  Humulin  R 65 units am, 60 units noon, 60 units pm  (Patient states if she does not eat, she still administers 30 units)   Current orders for Inpatient glycemic control:  Novolog  30 units TID Novolog  0-15 units TID + 0-5 units at bedtime   Inpatient Diabetes Program Recommendations:   Spoke with pt. Confirmed home medications for diabetes management. Patient states if she does not eat, she still administers Humulin  R 30 units. Discussed and provided education on long acting insulin  with patient to possibly help decrease Humulin  R doses. She was unaware of this type of insulin  and states she has never take it before. Dicussed A1C of 6.7% and A1C and blood glucose goals.   Please consider starting Semglee  10 units daily.   Thank,  Lavanda Search, RN, MSN, [facility]  Inpatient Diabetes Coordinator  Pager [phone] (8a-5p)

## 2024-02-21 DIAGNOSIS — K7290 Hepatic failure, unspecified without coma: Secondary | ICD-10-CM | POA: Diagnosis not present

## 2024-02-21 DIAGNOSIS — D61818 Other pancytopenia: Secondary | ICD-10-CM | POA: Diagnosis not present

## 2024-02-21 DIAGNOSIS — F419 Anxiety disorder, unspecified: Secondary | ICD-10-CM | POA: Diagnosis not present

## 2024-02-21 DIAGNOSIS — K7581 Nonalcoholic steatohepatitis (NASH): Secondary | ICD-10-CM | POA: Diagnosis not present

## 2024-02-21 DIAGNOSIS — R188 Other ascites: Secondary | ICD-10-CM | POA: Diagnosis not present

## 2024-02-21 DIAGNOSIS — K7460 Unspecified cirrhosis of liver: Secondary | ICD-10-CM | POA: Diagnosis not present

## 2024-02-21 LAB — CBC WITH DIFFERENTIAL/PLATELET
Abs Immature Granulocytes: 0.01 K/uL (ref 0.00–0.07)
Basophils Absolute: 0 K/uL (ref 0.0–0.1)
Basophils Relative: 1 %
Eosinophils Absolute: 0.1 K/uL (ref 0.0–0.5)
Eosinophils Relative: 5 %
HCT: 27 % — ABNORMAL LOW (ref 36.0–46.0)
Hemoglobin: 8.6 g/dL — ABNORMAL LOW (ref 12.0–15.0)
Immature Granulocytes: 0 %
Lymphocytes Relative: 17 %
Lymphs Abs: 0.4 K/uL — ABNORMAL LOW (ref 0.7–4.0)
MCH: 28.2 pg (ref 26.0–34.0)
MCHC: 31.9 g/dL (ref 30.0–36.0)
MCV: 88.5 fL (ref 80.0–100.0)
Monocytes Absolute: 0.4 K/uL (ref 0.1–1.0)
Monocytes Relative: 16 %
Neutro Abs: 1.5 K/uL — ABNORMAL LOW (ref 1.7–7.7)
Neutrophils Relative %: 61 %
Platelets: 63 K/uL — ABNORMAL LOW (ref 150–400)
RBC: 3.05 MIL/uL — ABNORMAL LOW (ref 3.87–5.11)
RDW: 17.2 % — ABNORMAL HIGH (ref 11.5–15.5)
WBC: 2.5 K/uL — ABNORMAL LOW (ref 4.0–10.5)
nRBC: 0 % (ref 0.0–0.2)

## 2024-02-21 LAB — COMPREHENSIVE METABOLIC PANEL WITH GFR
ALT: 23 U/L (ref 0–44)
AST: 43 U/L — ABNORMAL HIGH (ref 15–41)
Albumin: 2.7 g/dL — ABNORMAL LOW (ref 3.5–5.0)
Alkaline Phosphatase: 79 U/L (ref 38–126)
Anion gap: 12 (ref 5–15)
BUN: 10 mg/dL (ref 6–20)
CO2: 24 mmol/L (ref 22–32)
Calcium: 8.2 mg/dL — ABNORMAL LOW (ref 8.9–10.3)
Chloride: 98 mmol/L (ref 98–111)
Creatinine, Ser: 0.77 mg/dL (ref 0.44–1.00)
GFR, Estimated: >60 mL/min (ref >60)
Glucose, Bld: 130 mg/dL — ABNORMAL HIGH (ref 70–99)
Potassium: 3.5 mmol/L (ref 3.5–5.1)
Sodium: 134 mmol/L — ABNORMAL LOW (ref 135–145)
Total Bilirubin: 3.5 mg/dL — ABNORMAL HIGH (ref 0.0–1.2)
Total Protein: 5.9 g/dL — ABNORMAL LOW (ref 6.5–8.1)

## 2024-02-21 LAB — GLUCOSE, CAPILLARY
Glucose-Capillary: 132 mg/dL — ABNORMAL HIGH (ref 70–99)
Glucose-Capillary: 125 mg/dL — ABNORMAL HIGH (ref 70–99)
Glucose-Capillary: 184 mg/dL — ABNORMAL HIGH (ref 70–99)

## 2024-02-21 LAB — PROTIME-INR
INR: 1.8 — ABNORMAL HIGH (ref 0.8–1.2)
Prothrombin Time: 21.8 s — ABNORMAL HIGH (ref 11.4–15.2)

## 2024-02-21 LAB — HEMOGLOBIN A1C
Hgb A1c MFr Bld: 7.9 % — ABNORMAL HIGH (ref 4.8–5.6)
Mean Plasma Glucose: 180.03 mg/dL

## 2024-02-21 LAB — MAGNESIUM: Magnesium: 1.7 mg/dL (ref 1.7–2.4)

## 2024-02-21 LAB — AMMONIA: Ammonia: 43 umol/L — ABNORMAL HIGH (ref 9–35)

## 2024-02-21 LAB — PHOSPHORUS: Phosphorus: 4.2 mg/dL (ref 2.5–4.6)

## 2024-02-21 MED ORDER — HYDROCODONE-ACETAMINOPHEN 5-325 MG PO TABS: 1.0000 | ORAL_TABLET | Freq: Four times a day (QID) | ORAL | 0 refills | Status: DC | PRN

## 2024-02-21 MED ORDER — POTASSIUM CHLORIDE CRYS ER 20 MEQ PO TBCR
40.0000 meq | EXTENDED_RELEASE_TABLET | Freq: Once | ORAL | Status: AC
  Administered 2024-02-21: 40 meq via ORAL
  Filled 2024-02-21: qty 2

## 2024-02-21 MED ORDER — LACTULOSE 10 GM/15ML PO SOLN: 20.0000 g | Freq: Two times a day (BID) | ORAL | 0 refills | Status: DC

## 2024-02-21 MED ORDER — POLYETHYLENE GLYCOL 3350 17 G PO PACK: 17.0000 g | PACK | Freq: Every day | ORAL | 0 refills | Status: DC | PRN

## 2024-02-21 MED ORDER — FUROSEMIDE 20 MG PO TABS: 20.0000 mg | ORAL_TABLET | Freq: Every day | ORAL | 0 refills | Status: DC

## 2024-02-21 MED ORDER — SPIRONOLACTONE 50 MG PO TABS: 50.0000 mg | ORAL_TABLET | Freq: Every day | ORAL | 0 refills | Status: DC

## 2024-02-21 NOTE — Discharge Summary (Signed)
 Physician Discharge Summary   Patient: Laurie Sutton MRN: [id number] DOB: [birth year]  Admit date:     02/19/2024  Discharge date: 02/21/24  Discharge Physician: Alejandro Marker, DO   PCP: [name], MD   Recommendations at discharge:  {Tip this will not be part of the note when signed- Example include specific recommendations for outpatient follow-up, pending tests to follow-up on. (Optional):26781}  ***  Discharge Diagnoses: Active Problems:   Pancytopenia (HCC)   Anxiety and depression   GERD (gastroesophageal reflux disease)   DM (diabetes mellitus) (HCC)   Liver cirrhosis secondary to NASH (HCC)   Obesity (BMI 30-39.9)   Portal hypertensive gastropathy (HCC)   Decompensated hepatic cirrhosis (HCC)   Peripheral neuropathy   Ascites   Restless legs syndrome   QT prolongation   Decompensated cirrhosis (HCC)  Resolved Problems:   * No resolved hospital problems. *  Hospital Course: Laurie Sutton is a 43 y.o. female with medical history significant of diabetes, GERD, QT prolongation, anxiety, depression, NASH cirrhosis with ascites and portal hypertension and history of hepatic encephalopathy, history of SBP, RLS, neuropathy, pancytopenia, obesity, back pain presenting with nausea vomiting abdominal distention/discomfort.   Patient has known history of [name] with ascites as above.  Her ascites is recurrent and she gets weekly paracenteses.  Last paracentesis was 4 days ago and she was admitted at that time from November 5-November 7 for concurrent hematemesis.  CT was negative for acute bleeding and EGD by GI showed no acute bleeding and no stigmata of recent bleeding of her varices.  Patient was discharged in stable condition.   Patient has noted a more rapid accumulation of ascites with typical abdominal discomfort, but also some additional sharp pains in her right abdomen worse with coughs.  Called her transplant team where she is being evaluated at [facility]  and they recommended evaluation in the ED.   Admitted for abdominal pain and decompensated liver cirrhosis and received a Paracentesis. Also getting IV furosemide . Will continue Abx, Obtain GI evaluation and continue 2 gram Na+ diet.   Assessment and Plan:  Decompensated Cirrhosis w/ Ascites, Portal HTN, Hx of SBP, Esophageal Varices and Hx of Hepatic Encephalopathy: Has Known NASH cirrhosis with weekly paracenteses.  Recent admission 11/5-11/7 for hematemesis with negative CT and negative EGD. -Now with more rapid accumulation of fluid and is typical.  Having abdominal distention and discomfort as well as nausea and vomiting.  No fevers and no change in white count.  Increased tenderness at the right side of her abdomen on palpation compared to typical. -Was Unable to get paracentesis by IR on a nonemergent basis on the weekend but underwent IR Paracentesis this AM and yielded 8 liters of Straw colored fluid. CTM  on telemetry overnight -C/w IV Ceftriaxone  as below -Right upper quadrant ultrasound given recent CT showing evidence of gallstones and possible cholecystitis and it showed a Large amount of ascites in the right upper quadrant, Mildly enlarged liver with nodular contour compatible with cirrhosis, and Gallbladder sludge and a single 6.6 mm polyp. Current Recommendation is to Consider ultrasound follow-up in 12 months to assess for growth per common practice for small gallbladder polyps. -Will give her a dose of IV Furosemide  40 mg x1 and order TED hose.  -Continue home Midodrine  10 mg po BID and Spironolactone  will be increased from 25 mg to 50 mg po daily;  -Continue home Lactulose  20 grams po BID and Rifaximin  550 mg po BID; Ammonia Lvl went from  61 -> 34.  -Continue home PPI as below -Trend LFTs, PT/INR daily; calculated MELD-Na to be 23 today  -Continue 2 gram Na+ Diet   Diabetes Mellitus Type 2: She is on 60-65 units Humulin  R 3 times daily with meals at home; Currently on 30 Units  Novolog  TID with Meals and on Moderate Novolog  SSI AC/HS; Will add Semglee  10 units sq Daily. CTM CBGs per Protocol:  Recent Labs  Lab 02/17/24 0324 02/17/24 0817 02/19/24 1820 02/19/24 2103 02/20/24 0604 02/20/24 1235 02/20/24 1649  GLUCAP 210* 232* 300* 289* 213* 224* 307*   Recent UTI: Has been On Macrobid  in the outpatient setting and on IV Ceftriaxone  while admitted recently. Currently Back on ceftriaxone  for now with increased tenderness of her abdomen with concern for SBP however this was ruled out with the paracentesis.   RLS Continue home Ropinirole  1 mg po qHS  Anxiety and Depression:  Continue Fluoxetine  40 mg po BID and Temazepam  30 mg po qHS   HypoNa+: Na+ Trend:  Recent Labs  Lab 02/10/24 0015 02/15/24 2152 02/15/24 2215 02/16/24 0549 02/17/24 0333 02/19/24 1304 02/20/24 0604  NA 128* 132* 136 135 131* 130* 131*  -In the setting of Cirrhosis; increased Spironolactone  to 50 mg Daily; On IV Furosemide  today and transition to po in the AM  Pancytopenia: CBC Trend:  Recent Labs  Lab 02/03/24 0410 02/10/24 0015 02/15/24 2152 02/15/24 2215 02/16/24 0549 02/16/24 1511 02/17/24 0333 02/17/24 0839 02/19/24 1304 02/20/24 0604  WBC 1.7* 2.1* 1.9*  --  1.9*  --  2.1*  --  2.6* 2.5*  HGB 7.7* 8.4* 8.2*   < > 7.5*   < > 8.8*   < > 8.7* 8.1*  HCT 24.2* 26.5* 26.3*   < > 24.0*   < > 27.7*   < > 28.0* 25.3*  MCV 90.3 88.6 90.4  --  89.2  --  88.2  --  89.7 87.5  PLT 60* 61* 66*  --  62*  --  67*  --  65* 61*   < > = values in this interval not displayed.   GERD/GI Prophylaxis: Continue PPI w/ Pantoprazole  40 mg po BID   Hypoalbuminemia: Patient's Albumin  Lvl ranging between 2.8-3.7. CTM & Trend & repeat CMP in the AM   Overweight: Complicates overall prognosis and care. Estimated body mass index is 29.71 kg/m as calculated from the following:   Height as of this encounter: 5' 6 (1.676 m).   Weight as of this encounter: 83.5 kg. Weight Loss and Dietary  Counseling given  Consultants: Gastroenterology Procedures performed: Paracentesis   Disposition: Home  Diet recommendation:  Cardiac and Carb modified diet  DISCHARGE MEDICATION: Allergies as of 02/21/2024       Reactions   Vancomycin  Itching   Amoxicillin Hives, Itching   Azithromycin     DOES NOT WORK   Dulaglutide  Other (See Comments)   constipation and stomach issues  **Trulicity **   Empagliflozin -metformin  Hcl Er Other (See Comments), Swelling   Januvia  [sitagliptin ] Other (See Comments)   HURTS STOAMCH   Latex    IRRITATES VAGINAL AREA   Metformin     Other Reaction(s): achy all over   Morphine  Itching   Severe    Penicillins Hives   Has patient had a PCN reaction causing immediate rash, facial/tongue/throat swelling, SOB or lightheadedness with hypotension: yes. Rash  Has patient had a PCN reaction causing severe rash involving mucus membranes or skin necrosis: Yes- rash and hives all over body  Has  patient had a PCN reaction that required hospitalization No Has patient had a PCN reaction occurring within the last 10 years: Yes  If all of the above answers are NO, then may proceed with Cephalosporin use.   Vitamin K  And Related Other (See Comments)   Electric shock      Med Rec must be completed prior to using this [facility]***       Discharge Exam: Filed Weights   02/19/24 1757  Weight: 83.5 kg   Vitals:   02/21/24 0410 02/21/24 0822  BP: (!) 102/50 (!) 104/58  Pulse: 96 91  Resp: 18 18  Temp: 97.7 F (36.5 C) 97.6 F (36.4 C)  SpO2: 97% 96%   Examination: Physical Exam:  Constitutional: WN/WD, NAD and appears calm and comfortable Eyes: PERRL, lids and conjunctivae normal, sclerae anicteric  ENMT: External Ears, Nose appear normal. Grossly normal hearing. Mucous membranes are moist. Posterior pharynx clear of any exudate or lesions. Normal dentition.  Neck: Appears normal, supple, no cervical masses, normal ROM, no appreciable  thyromegaly Respiratory: Clear to auscultation bilaterally, no wheezing, rales, rhonchi or crackles. Normal respiratory effort and patient is not tachypenic. No accessory muscle use.  Cardiovascular: RRR, no murmurs / rubs / gallops. S1 and S2 auscultated. No extremity edema. 2+ pedal pulses. No carotid bruits.  Abdomen: Soft, non-tender, non-distended. No masses palpated. No appreciable hepatosplenomegaly. Bowel sounds positive.  GU: Deferred. Musculoskeletal: No clubbing / cyanosis of digits/nails. No joint deformity upper and lower extremities. Good ROM, no contractures. Normal strength and muscle tone.  Skin: No rashes, lesions, ulcers. No induration; Warm and dry.  Neurologic: CN 2-12 grossly intact with no focal deficits. Sensation intact in all 4 Extremities, DTR normal. Strength 5/5 in all 4. Romberg sign cerebellar reflexes not assessed.  Psychiatric: Normal judgment and insight. Alert and oriented x 3. Normal mood and appropriate affect.   Condition at discharge: {DC Condition:26389}  The results of significant diagnostics from this hospitalization (including imaging, microbiology, ancillary and laboratory) are listed below for reference.   Imaging Studies: IR Paracentesis Result Date: 02/20/2024 INDICATION: 43 year old female. History of NASH cirrhosis with recurrent ascites. Request is for therapeutic and diagnostic paracentesis EXAM: ULTRASOUND GUIDED THERAPEUTIC DIAGNOSTIC LEFT-SIDED PARACENTESIS MEDICATIONS: Lidocaine  1% 10 mL COMPLICATIONS: None immediate. PROCEDURE: Informed written consent was obtained from the patient after a discussion of the risks, benefits and alternatives to treatment. A timeout was performed prior to the initiation of the procedure. Initial ultrasound scanning demonstrates a large amount of ascites within the left lower abdominal quadrant. The left lower abdomen was prepped and draped in the usual sterile fashion. 1% lidocaine  was used for local anesthesia.  Following this, a 19 gauge, 7-cm, Yueh catheter was introduced. An ultrasound image was saved for documentation purposes. The paracentesis was performed. The catheter was removed and a dressing was applied. The patient tolerated the procedure well without immediate post procedural complication. Patient received post-procedure intravenous albumin ; see nursing notes for details. FINDINGS: A total of approximately 8 L of straw-colored fluid was removed. Samples were sent to the laboratory as requested by the clinical team. IMPRESSION: Successful ultrasound-guided therapeutic and diagnostic left-sided paracentesis yielding 8 liters of peritoneal fluid. Performed by Delon Beagle NP PLAN: Patient is currently being worked up for transplant at [facility] Electronically Signed   By: Cordella Banner   On: 02/20/2024 17:08   US  Abdomen Limited RUQ (LIVER/GB) Result Date: 02/19/2024 EXAM: Right Upper Quadrant Abdominal Ultrasound 02/19/2024 05:14:00 PM TECHNIQUE: Real-time ultrasonography of  the right upper quadrant of the abdomen was performed. COMPARISON: CTA abdomen and pelvis 02/15/2024. CLINICAL HISTORY: RUQ pain. FINDINGS: LIVER: The liver is mildly enlarged and diffusely heterogeneous with decreased echotexture. There is nodular liver contour compatible with cirrhosis. No intrahepatic biliary ductal dilatation. No evidence of mass. BILIARY SYSTEM: Gallbladder sludge is present. A single gallbladder polyp is present measuring 6.6 mm. Gallbladder wall thickness is upper limits of normal at 3 mm. No pericholecystic fluid. No cholelithiasis. Negative sonographic Murphy's sign. Common bile duct measures 1.6 mm. RIGHT KIDNEY: The right kidney is grossly unremarkable in appearances without evidence of hydronephrosis, echogenic calculi or worrisome mass lesions. PANCREAS: Visualized portions of the pancreas are unremarkable. OTHER: There is a large amount of ascites in the right upper quadrant.  IMPRESSION: 1. Large amount of ascites in the right upper quadrant. 2. Mildly enlarged liver with nodular contour compatible with cirrhosis. 3. Gallbladder sludge and a single 6.6 mm polyp. Consider ultrasound follow-up in 12 months to assess for growth per common practice for small gallbladder polyps. Electronically signed by: Greig Pique MD 02/19/2024 05:47 PM EST RP Workstation: [id number]   DG Chest Port 1 View Result Date: 02/19/2024 EXAM: 1 VIEW(S) XRAY OF THE CHEST 02/19/2024 01:12:00 PM COMPARISON: 01/31/2024 CLINICAL HISTORY: SOB FINDINGS: LUNGS AND PLEURA: Low lung volumes with vascular crowding and bibasilar subsegmental atelectasis. No pulmonary edema. No pleural effusion. No pneumothorax. HEART AND MEDIASTINUM: Multiple wires and leads project over the chest on the frontal radiograph. No acute abnormality of the cardiac and mediastinal silhouettes. BONES AND SOFT TISSUES: No acute osseous abnormality. IMPRESSION: 1. Persistent low lung volumes with vascular crowding and bibasilar subsegmental atelectasis, unchanged from 01/31/2024. Electronically signed by: Rockey Kilts MD 02/19/2024 01:45 PM EST RP Workstation: [id number]   CT Angio Abd/Pel W and/or Wo Contrast Result Date: 02/16/2024 EXAM: CTA ABDOMEN AND PELVIS WITHOUT AND WITH CONTRAST 02/15/2024 11:38:08 PM TECHNIQUE: CTA images of the abdomen and pelvis without and with intravenous contrast. 75 mL (iohexol  (OMNIPAQUE ) 350 MG/ML injection 75 mL IOHEXOL  350 MG/ML SOLN) was administered. Three-dimensional MIP/volume rendered formations were performed. Automated exposure control, iterative reconstruction, and/or weight based adjustment of the mA/kV was utilized to reduce the radiation dose to as low as reasonably achievable. COMPARISON: CT abdomen and pelvis 02/10/1999 and 02/04/2024. CLINICAL HISTORY: upper GI bleed FINDINGS: VASCULATURE: AORTA: No acute finding. No abdominal aortic aneurysm. No dissection. CELIAC TRUNK: No acute finding. No  occlusion or significant stenosis. SUPERIOR MESENTERIC ARTERY: No acute finding. No occlusion or significant stenosis. RENAL ARTERIES: No acute finding. No occlusion or significant stenosis. ILIAC ARTERIES: No acute finding. No occlusion or significant stenosis. *Additional vascular findings:* Portal vein is enlarged. Splenic varices are present. There is recanalization of the umbilical vein. LIVER: Nodular liver contour and diffuse hepatic heterogeneity is again seen compatible with cirrhosis. GALLBLADDER AND BILE DUCTS: There is gallbladder wall edema. Small gallstones are present. No biliary ductal dilatation. SPLEEN: The spleen is moderately enlarged and unchanged. PANCREAS: The pancreas is unremarkable. ADRENAL GLANDS: Bilateral adrenal glands demonstrate no acute abnormality. KIDNEYS, URETERS AND BLADDER: Previous subcentimeter cyst in the superior pole and inferior pole of the left kidney. No stones in the kidneys or ureters. No hydronephrosis. No perinephric or periureteral stranding. Urinary bladder is unremarkable. GI AND BOWEL: Stomach and duodenal sweep demonstrate no acute abnormality. There is diffuse colonic wall thickening/edema, more significant in the ascending colon. The appendix is normal. There is no active gastrointestinal bleeding identified. There is wall thickening and mesenteric edema involving jejunal  loops. There is no bowel obstruction. No abnormal bowel distension. REPRODUCTIVE: Uterus is surgically absent. PERITONEUM AND RETROPERITONEUM: Moderate-to-large amount of ascites appears unchanged. No free air. LUNG BASE: There is atelectasis in the left lung base. LYMPH NODES: No lymphadenopathy. BONES AND SOFT TISSUES: No acute abnormality of the bones. Diffuse body wall edema has increased. IMPRESSION: 1. No active gastrointestinal bleeding identified. 2. Cirrhosis with nodular liver contour, diffuse hepatic heterogeneity, portal vein enlargement, splenic varices, and recanalized umbilical  vein, compatible with portal hypertension. 3. Moderate-to-large ascites, unchanged, with increased diffuse body wall edema. 4. Gallbladder wall edema with small gallstones, which may reflect cholecystitis versus third-spacing; correlate clinically. 5. Diffuse colonic wall thickening/edema, greatest in the ascending colon, and jejunal wall thickening with mesenteric edema, which may reflect portal hypertensive enterocolopathy and/or superimposed infectious/inflammatory enterocolitis; correlate clinically. 6. Moderate splenomegaly, unchanged. Electronically signed by: Greig Pique MD 02/16/2024 12:06 AM EST RP Workstation: [id number]   IR Paracentesis Result Date: 02/15/2024 INDICATION: History of NASH cirrhosis with recurrent ascites. Request for therapeutic paracentesis. EXAM: ULTRASOUND GUIDED THERAPEUTIC PARACENTESIS MEDICATIONS: 10 mL 1% lidocaine  COMPLICATIONS: None immediate. PROCEDURE: Informed written consent was obtained from the patient after a discussion of the risks, benefits and alternatives to treatment. A timeout was performed prior to the initiation of the procedure. Initial ultrasound scanning demonstrates a moderate amount of ascites within the left lower abdominal quadrant. The left lower abdomen was prepped and draped in the usual sterile fashion. 1% lidocaine  was used for local anesthesia. Following this, a 19 gauge, 7-cm, Yueh catheter was introduced. An ultrasound image was saved for documentation purposes. The paracentesis was performed. The catheter was removed and a dressing was applied. The patient tolerated the procedure well without immediate post procedural complication. Patient received post-procedure intravenous albumin ; see nursing notes for details. FINDINGS: A total of approximately 7.4 liters of slightly hazy yellow fluid was removed. IMPRESSION: Successful ultrasound-guided paracentesis yielding 7.4 liters liters of peritoneal fluid. Performed by: Kimble Clas, PA-C  Electronically Signed   By: Cordella Banner   On: 02/15/2024 11:24   CT ABDOMEN PELVIS W CONTRAST Addendum Date: 02/10/2024 ******** ADDENDUM #1 ******** ADDENDUM: Moderate volume simple fluid within the left posterolateral soft tissues of the mid lower abdomen suggestive of a peritoneal fluid leak in the setting of recent paracentesis. No soft tissue hematoma . Associated with the left abdominal walls and soft tissue edema. No significant soft tissue emphysema. No organized fluid collection. There is a 9mm discontinuity of the left lateral superficial abdominal musculature which is likely the site of the prior paracentesis. No peritoneal discontinuity or hernia identified. ---------------------------------------------------- Electronically signed by: Kate Plummer MD 02/10/2024 03:34 AM EDT RP Workstation: [id number]   Result Date: 02/10/2024 ******** ORIGINAL REPORT ******** EXAM: CT ABDOMEN AND PELVIS WITH CONTRAST 02/10/2024 02:36:02 AM TECHNIQUE: CT of the abdomen and pelvis was performed with the administration of 75 mL of iohexol  (OMNIPAQUE ) 350 MG/ML injection. Multiplanar reformatted images are provided for review. Automated exposure control, iterative reconstruction, and/or weight-based adjustment of the mA/kV was utilized to reduce the radiation dose to as low as reasonably achievable. COMPARISON: CT abdomen and pelvis 01/31/2024. CLINICAL HISTORY: LLQ abdominal pain. FINDINGS: LOWER CHEST: No acute abnormality. LIVER: Nodular hepatic contour. Recanalized paraumbilical vein. GALLBLADDER AND BILE DUCTS: Calcified gallstone within gallbladder lumen. No gallbladder wall thickening. No biliary ductal dilatation. SPLEEN: Enlarged spleen measuring up to 19 cm. PANCREAS: No acute abnormality. ADRENAL GLANDS: No acute abnormality. KIDNEYS, URETERS AND BLADDER: Punctate left nephrolithiasis. No right nephrolithiasis. Subcentimeter hypodensity  within the left kidney too small to characterize - no further  follow-up indicated. No hydronephrosis. No perinephric or periureteral stranding. Urinary bladder is unremarkable. GI AND BOWEL: Paraesophageal varices. No small or large bowel thickening or dilatation. Unremarkable appendix. There is no bowel obstruction. PERITONEUM AND RETROPERITONEUM: Interval decrease in small to moderate volume of simple free fluid consistent with ascites. No free air. VASCULATURE: Aorta is normal in caliber. Mild atherosclerotic plaque. LYMPH NODES: No lymphadenopathy. REPRODUCTIVE ORGANS: Hysterectomy. No adnexal mass. BONES AND SOFT TISSUES: No acute osseous abnormality. No focal soft tissue abnormality. IMPRESSION: 1. Cirrhosis with portal hypertension. No focal liver lesions identified. Please note that liver protocol-enhanced MR and CT are the most sensitive tests for the screening detection of hepatocellular carcinoma in the high-risk setting of cirrhosis. 2. Interval decrease in small-to-moderate volume simple free fluid ascites. 3. Cholelithiasis with no CT evidence of cholecystitis. 4. Nonobstructive punctate left nephrolithiasis. Electronically signed by: Morgane Naveau MD 02/10/2024 03:14 AM EDT RP Workstation: [id number]   IR Paracentesis Result Date: 02/08/2024 INDICATION: 43 year old female. History of NASH cirrhosis with recurrent ascites. Request therapeutic paracentesis. 8 L maximum EXAM: ULTRASOUND GUIDED LEFT-SIDED THERAPEUTIC PARACENTESIS MEDICATIONS: Lidocaine  1% 10 mL COMPLICATIONS: None immediate. PROCEDURE: Informed written consent was obtained from the patient after a discussion of the risks, benefits and alternatives to treatment. A timeout was performed prior to the initiation of the procedure. Initial ultrasound scanning demonstrates a large amount of ascites within the left lower abdominal quadrant. The left lower abdomen was prepped and draped in the usual sterile fashion. 1% lidocaine  was used for local anesthesia. Following this, a 19 gauge, 7-cm, Yueh  catheter was introduced. An ultrasound image was saved for documentation purposes. The paracentesis was performed. The catheter was removed and a dressing was applied. The patient tolerated the procedure well without immediate post procedural complication. Patient received post-procedure intravenous albumin ; see nursing notes for details. FINDINGS: A total of approximately 7.9 L of straw-colored fluid was removed. IMPRESSION: Successful ultrasound-guided therapeutic left-sided paracentesis yielding 7.9 liters of peritoneal fluid. Performed by Delon Beagle NP PLAN: Patient currently on the transplant list at [facility] Electronically Signed   By: CHRISTELLA.  Shick M.D.   On: 02/08/2024 11:18   IR Paracentesis Result Date: 02/03/2024 INDICATION: History of NASH cirrhosis, recurrent ascites. Request for diagnostic and therapeutic paracentesis. Most recent paracentesis 02/01/24 with 8.5 liters output. She is currently inpatient; she follows with Stephane Quest, NP for hepatology outpatient. EXAM: ULTRASOUND GUIDED DIAGNOSTIC AND THERAPEUTIC PARACENTESIS MEDICATIONS: 10 mL 1% lidocaine  with epi COMPLICATIONS: None immediate. PROCEDURE: Informed written consent was obtained from the patient after a discussion of the risks, benefits and alternatives to treatment. A timeout was performed prior to the initiation of the procedure. Initial ultrasound scanning demonstrates a moderate to large amount of ascites within the left lower abdominal quadrant. The left lower abdomen was prepped and draped in the usual sterile fashion. 1% lidocaine  was used for local anesthesia. Following this, a 5 Fr, One Step, 7-cm catheter was introduced. An ultrasound image was saved for documentation purposes. The paracentesis was performed. The catheter was removed and a dressing was applied. The patient tolerated the procedure well without immediate post procedural complication. Patient received post-procedure intravenous albumin ; see nursing  notes for details. FINDINGS: A total of approximately 4 of clear, yellow fluid was removed. Samples were sent to the laboratory as requested by the clinical team. IMPRESSION: Successful ultrasound-guided paracentesis yielding 4 liters of peritoneal fluid. Performed by Laymon Coast, NP under the  supervision of Dr. Vanice. Electronically Signed   By: CHRISTELLA.  Shick M.D.   On: 02/03/2024 12:53   IR Paracentesis Result Date: 02/01/2024 INDICATION: History of NASH cirrhosis, recurrent ascites. Request for diagnostic and therapeutic paracentesis. EXAM: ULTRASOUND GUIDED DIAGNOSTIC AND THERAPEUTIC PARACENTESIS MEDICATIONS: 8 mL 1% lidocaine  COMPLICATIONS: None immediate. PROCEDURE: Informed written consent was obtained from the patient after a discussion of the risks, benefits and alternatives to treatment. A timeout was performed prior to the initiation of the procedure. Initial ultrasound scanning demonstrates a large amount of ascites within the left lower abdominal quadrant. The left lower abdomen was prepped and draped in the usual sterile fashion. 1% lidocaine  was used for local anesthesia. Following this, a 19 gauge, 7-cm, Yueh catheter was introduced. An ultrasound image was saved for documentation purposes. The paracentesis was performed. The catheter was removed and a dressing was applied. The patient tolerated the procedure well without immediate post procedural complication. FINDINGS: A total of approximately 8.5 liters of hazy yellow fluid was removed. Samples were sent to the laboratory as requested by the clinical team. IMPRESSION: Successful ultrasound-guided paracentesis yielding 8.5 liters of peritoneal fluid. Performed by: Wyatt Pommier, PA-C Electronically Signed   By: CHRISTELLA.  Shick M.D.   On: 02/01/2024 12:38   CT ABDOMEN PELVIS W CONTRAST Result Date: 02/01/2024 CLINICAL DATA:  Acute abdominal pain and abdominal distension EXAM: CT ABDOMEN AND PELVIS WITH CONTRAST TECHNIQUE: Multidetector CT  imaging of the abdomen and pelvis was performed using the standard protocol following bolus administration of intravenous contrast. RADIATION DOSE REDUCTION: This exam was performed according to the departmental dose-optimization program which includes automated exposure control, adjustment of the mA and/or kV according to patient size and/or use of iterative reconstruction technique. CONTRAST:  75mL OMNIPAQUE  IOHEXOL  350 MG/ML SOLN COMPARISON:  01/06/2024 FINDINGS: Lower chest: No acute abnormality. Hepatobiliary: Liver is nodular and shrunken consistent with underlying cirrhotic change. Considerable ascites is identified. The gallbladder demonstrates dependent gallstones stable in appearance from the prior exam. No biliary ductal dilatation is seen. Pancreas: Unremarkable. No pancreatic ductal dilatation or surrounding inflammatory changes. Spleen: Spleen remains enlarged in size. Dilatation of the splenic vein is noted. Adrenals/Urinary Tract: Adrenal glands are within normal limits. Kidneys demonstrate a normal enhancement pattern bilaterally. No renal calculi or obstructive changes are seen. The bladder is well distended. Stomach/Bowel: No obstructive or inflammatory changes of the colon are seen. Mild scattered fecal material is seen. The appendix is air filled. Small bowel and stomach appear within normal limits. Small paraesophageal varices are seen. Vascular/Lymphatic: Prominence of the portal vein is noted consistent with the underlying portal hypertension. Mild para esophageal varices are seen. Recanalization of the umbilical vein is noted but diminutive size similar to that seen on prior exam. Reproductive: Uterus has been surgically removed. Ovaries appear within normal limits bilaterally. Other: Significant ascites is noted consistent with the underlying portal hypertension. This is increased when compared prior exam despite interval paracentesis. Musculoskeletal: No acute or significant osseous  findings. IMPRESSION: Cirrhotic change of the liver with underlying portal hypertension and ascites. Splenomegaly is noted as well. Cholelithiasis stable from the prior study. No acute abnormality noted. Electronically Signed   By: Oneil Devonshire M.D.   On: 02/01/2024 00:13   DG Chest Port 1 View Result Date: 01/31/2024 CLINICAL DATA:  Cough and fever EXAM: PORTABLE CHEST 1 VIEW COMPARISON:  01/13/2024 FINDINGS: The heart size and mediastinal contours are within normal limits. Both lungs are clear. The visualized skeletal structures are unremarkable. IMPRESSION: No active  disease. Electronically Signed   By: Luke Bun M.D.   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 IR was requested for therapeutic paracentesis. 8 L maximum. EXAM: ULTRASOUND GUIDED THERAPEUTIC PARACENTESIS MEDICATIONS: 4 cc of 1% lidocaine  with epi. COMPLICATIONS: None immediate. PROCEDURE: Informed written consent was obtained from the patient after a discussion of the risks, benefits and alternatives to treatment. A timeout was performed prior to the initiation of the procedure. Initial ultrasound scanning demonstrates a large amount of ascites within the right lower abdominal quadrant. The right lower abdomen was prepped and draped in the usual sterile fashion. 1% lidocaine  was used for local anesthesia. Following this, a 19 gauge, 7-cm, Yueh catheter was introduced. An ultrasound image was saved for documentation purposes. The paracentesis was performed. The catheter was removed and a dressing was applied. The patient tolerated the procedure well without immediate post procedural complication. Patient received post-procedure intravenous albumin ; see nursing notes for details. FINDINGS: A total of approximately 8.0 L of clear, straw-colored peritoneal fluid was removed. IMPRESSION: Successful ultrasound-guided paracentesis yielding 8.0  liters of peritoneal fluid. Procedure performed by Carlin Griffon, PA-C Electronically Signed   By: Cordella Banner   On: 01/27/2024 16:55    Microbiology: Results for orders placed or performed during the hospital encounter of 02/19/24  Body fluid culture w Gram Stain     Status: None (Preliminary result)   Collection Time: 02/20/24  9:18 AM   Specimen: Abdomen; Peritoneal Fluid  Result Value Ref Range Status   Specimen Description PERITONEAL  Final   Special Requests ABDOMEN  Final   Gram Stain NO WBC SEEN NO ORGANISMS SEEN   Final   Culture   Final    NO GROWTH < 24 HOURS Performed at [facility], [address], [place], [place] [address]    Report Status PENDING  Incomplete   *Note: Due to a large number of results and/or encounters for the requested time period, some results have not been displayed. A complete set of results can be found in Results Review.    Labs: CBC: Recent Labs  Lab 02/16/24 0549 02/16/24 1511 02/17/24 0333 02/17/24 0839 02/19/24 1304 02/20/24 0604 02/21/24 0419  WBC 1.9*  --  2.1*  --  2.6* 2.5* 2.5*  NEUTROABS  --   --   --   --  1.9  --  1.5*  HGB 7.5*   < > 8.8* 8.4* 8.7* 8.1* 8.6*  HCT 24.0*   < > 27.7* 26.3* 28.0* 25.3* 27.0*  MCV 89.2  --  88.2  --  89.7 87.5 88.5  PLT 62*  --  67*  --  65* 61* 63*   < > = values in this interval not displayed.   Basic Metabolic Panel: Recent Labs  Lab 02/16/24 0549 02/17/24 0333 02/19/24 1304 02/20/24 0604 02/21/24 0419  NA 135 131* 130* 131* 134*  K 3.0* 4.1 4.1 3.8 3.5  CL 100 99 97* 98 98  CO2 24 21* 20* 24 24  GLUCOSE 92 231* 320* 221* 130*  BUN 6 6 7 8 10   CREATININE 1.50* 0.65 0.86 0.75 0.77  CALCIUM  8.4* 8.2* 8.4* 8.3* 8.2*  MG  --  1.7  --   --  1.7  PHOS  --  3.0  --   --  4.2   Liver Function Tests: Recent Labs  Lab 02/15/24 2152 02/17/24 0333 02/19/24 1304 02/20/24 0604 02/21/24  0419  AST 50* 55* 44* 38 43*  ALT 26 26 25 23 23   ALKPHOS 110 78 88 90 79  BILITOT  4.6* 4.0* 5.0* 3.8* 3.5*  PROT 7.0 6.1* 6.4* 5.9* 5.9*  ALBUMIN  3.7 3.0* 3.0* 2.8* 2.7*   CBG: Recent Labs  Lab 02/20/24 2004 02/20/24 2209 02/21/24 0606 02/21/24 0821 02/21/24 1201  GLUCAP 202* 227* 132* 125* 184*    Discharge time spent: {LESS THAN/GREATER THAN:26388} 30 minutes.  Signed: Alejandro Lazarus Marker, DO [facility] 02/21/2024

## 2024-02-21 NOTE — Consult Note (Addendum)
 Consultation Note   Referring Provider:   [facility] PCP: [name], MD Primary Gastroenterologist:  Victory Brand, MD       Reason for Consultation:  Decompensated cirrhosis DOA: 02/19/2024         Hospital Day: 3   ASSESSMENT    43 year old female with decompensated MASH cirrhosis with history of gastropathy, esophageal varices, hepatic encephalopathy,  and recurrent large-volume ascites requiring frequent paracentesis.  She is listed on liver transplant list with [facility] in [place].  Currently admitted with recurrent large-volume paracentesis resulting in nausea / vomiting and generalized abdominal pain.   MELD 3.0: 21 at 02/21/2024  4:19 AM MELD-Na: 20 at 02/21/2024  4:19 AM S/p 8 L LVP yesterday. No SBP by cell count. Culture negative so far. Abdominal discomfort  / nausea greatly improved following LVP.   GERD Takes BID Pantoprazole  at home  Prolonged QT interval  Restless leg syndrome Takes Requip   See PMH for any additional medical history  / medical problems  Active Problems:   Anxiety and depression   GERD (gastroesophageal reflux disease)   DM (diabetes mellitus) (HCC)   Liver cirrhosis secondary to NASH (HCC)   Pancytopenia (HCC)   Obesity (BMI 30-39.9)   Portal hypertensive gastropathy (HCC)   Decompensated hepatic cirrhosis (HCC)   Peripheral neuropathy   Ascites   Restless legs syndrome   QT prolongation   Decompensated cirrhosis (HCC)   PLAN:   -I spoke with Stephane Quest, NP with [facility] who follows patient closely. She suggested patient be transferred to [facility] as she is 2nd on transplant list . She was to call me back with [facility] transplant contact information. In the interim however, I spoke with Hospitalist who called [facility] transplanct and they feel patient can wait at home ( ? for possible transplant as soon as tomorrow) if patient okay to be discharged from [facility].   --Continue home Pantoprazole  --In the interim, continue midodrine , xifaxan , lactulose , low dose diuretics ( renal function is normal).     ATTENDING ADDENDUM I personally saw the patient and performed a substantive portion of this encounter including a complete performance of at least one of the key components (MDM, Hx and/or Exam), in conjunction with [name].  43 y/o female with decompensated MASH cirrhosis - varices, hepatic encephalopathy, recurrent ascites. Came into the hospital for worsening ascites, nausea / vomiting, pain. Paracentesis done, 8 L removed, negative for SBP. She is feeling improved.  She is stable at this time. We have communicated with her liver transplant team - she is now at the top of the list for transplant and could be offered a liver as soon as tomorrow. They recommended discharging her to home if stable and they will contact her to come in for transplant. Patient states she was just told she has a bed at [facility] if she can be discharged today. I think she is stable at this time for discharge, continue outpatient meds, can add lose dose of lasix  to aldactone  as her renal function is stable. Would not delay her and keep her here when liver transplant is of the utmost importance for her and she appears stable at this time.   All questions  answered, she agrees with the plan.  Marcey Naval, MD [facility]     HPI   Brief history  Laurie Sutton is well-known to my practice for history of decompensated cirrhosis.  She has had multiple admissions for refractory ascites.  She was hospitalized several days ago with self-limited hematemesis..  Inpatient EGD was done, no source of bleeding found.  Portal gastropathy, inflamed gastric cardia (not biopsied) and grade 2 esophageal varices were found.  She has chronic anemia and her hemoglobin remained around baseline in the mid 7 to mid 8 range without need for transfusion.   Interval  history Laurie Sutton returned to the ED yesterday with complaints of generalized abdominal pain, nausea vomiting and recurrent large-volume ascites.  She has chronic abdominal pain and takes Bentyl  as needed.  She tries hard to follow a 2 g sodium restricted diet.  At home she is taking 25 mg of spironolactone , not on any other diuretics.  She is taking twice daily Xifaxan .  She takes lactulose  3-4 times a day and has about 3 bowel movements a day.   Her labs this admission are reassuring.  Liver chemistries are stable compared to recent labs.  Sodium 134.  Pancytopenia is stable.  INR 1.8  Laurie Sutton feels much better today post LVP   Pertinent GI Studies   02/16/24 EGD for hematemesis - Grade II esophageal varices with no bleeding and no stigmata of recent bleeding. Varices are small in size.  - Portal hypertensive gastropathy.  - A single gastric cardia polyp with mild inflammatory change (benign appearing, most consistent with small hyperplastic polyp). Resection would be challenging given location for this low risk, small polyp.  - Nodular antral gastritis without bleeding.  - Normal examined duodenum.  - No evidence of blood in the UGI tract on this EGD. Cannot exclude recent bleeding from portal gastropathy or inflamed cardia polyp in setting of coagulopathy and low platelets.  - No specimens collected.  August 2025 flexible sigmoidoscopy for rectal bleeding - The rectum, sigmoid colon and descending colon are normal. Fecal matter in descending colon nonbloody and non-melenic.  - Internal hemorrhoids. This is the source of intermittent rectal bleeding.  - No specimens collected.  Paracentesis  Latest Reference Range & Units 02/20/24 09:18  Monocyte-Macrophage-Serous Fluid 50 - 90 % 38 (L)  Color, Fluid YELLOW  YELLOW !  Total Nucleated Cell Count, Fluid 0 - 1,000 cu mm 49  Fluid Type-FCT   PERITONEAL FL (C)  Lymphs, Fluid % 59  Eos, Fluid % 0  Appearance, Fluid CLEAR  HAZY !  Neutrophil  Count, Fluid 0 - 25 % 3  (L): Data is abnormally low !: Data is abnormal (C): Corrected   Labs and Imaging:  Recent Labs    02/19/24 1304 02/20/24 0604 02/21/24 0419  PROT 6.4* 5.9* 5.9*  ALBUMIN  3.0* 2.8* 2.7*  AST 44* 38 43*  ALT 25 23 23   ALKPHOS 88 90 79  BILITOT 5.0* 3.8* 3.5*   Recent Labs    02/19/24 1304 02/20/24 0604 02/21/24 0419  WBC 2.6* 2.5* 2.5*  HGB 8.7* 8.1* 8.6*  HCT 28.0* 25.3* 27.0*  MCV 89.7 87.5 88.5  PLT 65* 61* 63*   Recent Labs    02/19/24 1304 02/20/24 0604 02/21/24 0419  NA 130* 131* 134*  K 4.1 3.8 3.5  CL 97* 98 98  CO2 20* 24 24  GLUCOSE 320* 221* 130*  BUN 7 8 10   CREATININE 0.86 0.75 0.77  CALCIUM  8.4* 8.3* 8.2*  IR Paracentesis INDICATION: 43 year old female. History of NASH cirrhosis with recurrent ascites. Request is for therapeutic and diagnostic paracentesis  EXAM: ULTRASOUND GUIDED THERAPEUTIC DIAGNOSTIC LEFT-SIDED PARACENTESIS  MEDICATIONS: Lidocaine  1% 10 mL  COMPLICATIONS: None immediate.  PROCEDURE: Informed written consent was obtained from the patient after a discussion of the risks, benefits and alternatives to treatment. A timeout was performed prior to the initiation of the procedure.  Initial ultrasound scanning demonstrates a large amount of ascites within the left lower abdominal quadrant. The left lower abdomen was prepped and draped in the usual sterile fashion. 1% lidocaine  was used for local anesthesia.  Following this, a 19 gauge, 7-cm, Yueh catheter was introduced. An ultrasound image was saved for documentation purposes. The paracentesis was performed. The catheter was removed and a dressing was applied. The patient tolerated the procedure well without immediate post procedural complication. Patient received post-procedure intravenous albumin ; see nursing notes for details.  FINDINGS: A total of approximately 8 L of straw-colored fluid was removed. Samples were sent to the  laboratory as requested by the clinical team.  IMPRESSION: Successful ultrasound-guided therapeutic and diagnostic left-sided paracentesis yielding 8 liters of peritoneal fluid.  Performed by Delon Beagle NP  PLAN: Patient is currently being worked up for transplant at [facility]  Electronically Signed   By: Cordella Banner   On: 02/20/2024 17:08     Past Medical History:  Diagnosis Date   ADHD    Allergy    Anemia    IRON TRANSFUSION 07-2020 NONE SINCE   Anxiety    Back pain    COVID 08/12/2019   ALL SYMPTOMS REOLVED PER PT   Depression    Diabetic neuropathy (HCC) 11/11/2020   FEET   dm type 2    sees Dr. Odella Jacobson at [facility]   Fatty liver    GERD (gastroesophageal reflux disease)    History of kidney stones    Joint pain    Menorrhagia 11/11/2020   Migraines    Murmur, cardiac    FAINT NO CARDIOLOGIST   Other fatigue    Pneumonia 08/12/2019   COVID PNEUMONIA ALL SYMPTOMS RESOLVED PER PT   Sepsis (HCC) 10/04/2023   Shortness of breath on exertion     Past Surgical History:  Procedure Laterality Date   CESAREAN SECTION  12/13/2006   COLONOSCOPY WITH PROPOFOL  N/A 08/02/2023   Procedure: COLONOSCOPY WITH PROPOFOL ;  Surgeon: Legrand Victory LITTIE DOUGLAS, MD;  Location: [facility];  Service: Gastroenterology;  Laterality: N/A;   ESOPHAGOGASTRODUODENOSCOPY N/A 02/16/2024   Procedure: EGD (ESOPHAGOGASTRODUODENOSCOPY);  Surgeon: Albertus Gordy HERO, MD;  Location: [facility];  Service: Gastroenterology;  Laterality: N/A;   ESOPHAGOGASTRODUODENOSCOPY (EGD) WITH PROPOFOL  N/A 08/02/2023   Procedure: ESOPHAGOGASTRODUODENOSCOPY (EGD) WITH PROPOFOL ;  Surgeon: Legrand Victory LITTIE DOUGLAS, MD;  Location: [facility];  Service: Gastroenterology;  Laterality: N/A;   EXTRACORPOREAL SHOCK WAVE LITHOTRIPSY  2018   FLEXIBLE SIGMOIDOSCOPY N/A 11/20/2023   Procedure: KINGSTON SIDE;  Surgeon: Albertus Gordy HERO, MD;  Location: [facility];  Service:  Gastroenterology;  Laterality: N/A;   FOOT SURGERY Right 10/02/2020   RIGHT FOOT HEEL AND TOES, SURGICAL CENTER OFF [address]   IR PARACENTESIS  07/06/2023   IR PARACENTESIS  08/18/2023   IR PARACENTESIS  10/05/2023   IR PARACENTESIS  10/11/2023   IR PARACENTESIS  10/20/2023   IR PARACENTESIS  11/08/2023   IR PARACENTESIS  11/28/2023   IR PARACENTESIS  12/09/2023   IR PARACENTESIS  12/15/2023   IR PARACENTESIS  12/16/2023   IR PARACENTESIS  12/23/2023   IR PARACENTESIS  12/30/2023   IR PARACENTESIS  01/06/2024   IR PARACENTESIS  01/06/2024   IR PARACENTESIS  01/10/2024   IR PARACENTESIS  01/13/2024   IR PARACENTESIS  01/20/2024   IR PARACENTESIS  01/27/2024   IR PARACENTESIS  02/01/2024   IR PARACENTESIS  02/03/2024   IR PARACENTESIS  02/08/2024   IR PARACENTESIS  02/15/2024   IR PARACENTESIS  02/20/2024   IR TRANSCATHETER BX  07/26/2023   IR US  GUIDE VASC ACCESS RIGHT  07/26/2023   IR VENOGRAM HEPATIC W HEMODYNAMIC EVALUATION  07/26/2023   LAPAROSCOPIC VAGINAL HYSTERECTOMY WITH SALPINGECTOMY Bilateral 11/17/2020   Procedure: LAPAROSCOPIC ASSISTED VAGINAL HYSTERECTOMY WITH BILATERAL SALPINGECTOMY;  Surgeon: Dannielle Bouchard, DO;  Location: [facility];  Service: Gynecology;  Laterality: Bilateral;   POLYPECTOMY  08/02/2023   Procedure: POLYPECTOMY, INTESTINE;  Surgeon: Legrand Victory LITTIE DOUGLAS, MD;  Location: [facility];  Service: Gastroenterology;;    Family History  Problem Relation Age of Onset   Colon polyps Mother    Depression Mother    Anxiety disorder Mother    Colon polyps Father    Sleep apnea Father    Anxiety disorder Father    Depression Father    Diabetes Father    Bladder Cancer Father 32   Rheum arthritis Father    Migraines Maternal Aunt    Migraines Maternal Grandmother    Diabetes Maternal Grandfather    Healthy Daughter    Asthma Daughter    Anxiety disorder Daughter    Anxiety disorder Son    Asthma Son    Healthy Son    Cerebral aneurysm Cousin    Heart  disease Other    Depression Other    Anxiety disorder Other    Sleep apnea Other    Colon cancer Neg Hx    Esophageal cancer Neg Hx    Stomach cancer Neg Hx    Rectal cancer Neg Hx     Prior to Admission medications   Medication Sig Start Date End Date Taking? Authorizing Provider  baclofen (LIORESAL) 10 MG tablet Take 10 mg by mouth once a week. After paracentesis 02/10/24 02/09/25 Yes [provider]  clotrimazole -betamethasone  (LOTRISONE ) cream Apply 1 Application topically daily as needed (rash).   Yes [provider]  ergocalciferol  (VITAMIN D2) 1.25 MG (50000 UT) capsule Take 50,000 Units by mouth once a week.   Yes [provider]  FLUoxetine  (PROZAC ) 40 MG capsule Take 1 capsule (40 mg total) by mouth 2 (two) times daily. 01/03/24  Yes [name], MD  insulin  regular human CONCENTRATED (HUMULIN  R) 500 UNIT/ML injection Inject 60-65 Units into the skin See admin instructions. 65 in the morning, 60 in the afternoon and 60 at bedtime   Yes [provider]  lactulose  (CHRONULAC ) 10 GM/15ML solution Take 30 mLs (20 g total) by mouth 4 (four) times daily. 12/19/23 03/18/24 Yes Dahal, Chapman, MD  midodrine  (PROAMATINE ) 5 MG tablet Take 3 tablets (15 mg total) by mouth with breakfast, with lunch, and with evening meal. Patient taking differently: Take 10 mg by mouth 2 (two) times daily with a meal. 02/03/24  Yes Samtani, Jai-Gurmukh, MD  montelukast  (SINGULAIR ) 10 MG tablet Take 1 tablet (10 mg total) by mouth at bedtime. 08/25/22  Yes [name], MD  nitrofurantoin , macrocrystal-monohydrate, (MACROBID ) 100 MG capsule Take 1 capsule (100 mg total) by mouth 2 (two) times daily. 02/13/24  Yes [name],  MD  ondansetron  (ZOFRAN -ODT) 4 MG disintegrating tablet Take 1 tablet (4 mg total) by mouth every 8 (eight) hours as needed for nausea or vomiting. 01/06/24  Yes Garrick Charleston, MD  pantoprazole  (PROTONIX ) 40 MG tablet Take 1 tablet (40 mg total) by  mouth 2 (two) times daily. 02/17/24 03/18/24 Yes Leotis Bogus, MD  rOPINIRole  (REQUIP ) 1 MG tablet Take 1 tablet (1 mg total) by mouth at bedtime. 01/03/24  Yes [name], MD  spironolactone  (ALDACTONE ) 25 MG tablet Take 1 tablet (25 mg total) by mouth daily. 02/03/24 02/02/25 Yes Samtani, Jai-Gurmukh, MD  temazepam  (RESTORIL ) 30 MG capsule Take 1 capsule (30 mg total) by mouth at bedtime as needed for sleep. 02/13/24  Yes [name], MD  XIFAXAN  550 MG TABS tablet Take 550 mg by mouth 2 (two) times daily. 10/27/23  Yes [provider]  Continuous Glucose Sensor (DEXCOM G7 SENSOR) MISC Use 1 sensor for continuous glucose monitoring every 10 days for 30 days 05/25/23   Braulio Hough, MD    Current Facility-Administered Medications  Medication Dose Route Frequency Provider Last Rate Last Admin   acetaminophen  (TYLENOL ) tablet 650 mg  650 mg Oral Q6H PRN Seena Marsa NOVAK, MD   650 mg at 02/20/24 1119   Or   acetaminophen  (TYLENOL ) suppository 650 mg  650 mg Rectal Q6H PRN Seena Marsa NOVAK, MD       cefTRIAXone  (ROCEPHIN ) 2 g in sodium chloride  0.9 % 100 mL IVPB  2 g Intravenous Q24H Seena Marsa NOVAK, MD 200 mL/hr at 02/20/24 1905 2 g at 02/20/24 1905   FLUoxetine  (PROZAC ) capsule 40 mg  40 mg Oral BID Melvin, Alexander B, MD   40 mg at 02/21/24 1020   furosemide  (LASIX ) tablet 20 mg  20 mg Oral Daily Sheikh, Omair Latif, DO   20 mg at 02/21/24 1021   guaiFENesin (ROBITUSSIN) 100 MG/5ML liquid 5 mL  5 mL Oral Q4H PRN Seena Marsa NOVAK, MD       HYDROcodone -acetaminophen  (NORCO/VICODIN) 5-325 MG per tablet 1 tablet  1 tablet Oral Q6H PRN Sheikh, Omair Latif, DO   1 tablet at 02/21/24 1038   insulin  aspart (novoLOG ) injection 0-15 Units  0-15 Units Subcutaneous TID WC Melvin, Alexander B, MD   2 Units at 02/21/24 1024   insulin  aspart (novoLOG ) injection 0-5 Units  0-5 Units Subcutaneous QHS Melvin, Alexander B, MD   2 Units at 02/20/24 2228   insulin  aspart (novoLOG )  injection 30 Units  30 Units Subcutaneous TID WC Melvin, Alexander B, MD   30 Units at 02/21/24 1023   insulin  glargine-yfgn (SEMGLEE ) injection 10 Units  10 Units Subcutaneous Q24H Sheikh, Omair Latif, [place]   10 Units at 02/20/24 2236   lactulose  (CHRONULAC ) 10 GM/15ML solution 20 g  20 g Oral BID Melvin, Alexander B, MD   20 g at 02/21/24 1020   midodrine  (PROAMATINE ) tablet 10 mg  10 mg Oral BID WC Melvin, Alexander B, MD   10 mg at 02/21/24 1021   pantoprazole  (PROTONIX ) EC tablet 40 mg  40 mg Oral BID Melvin, Alexander B, MD   40 mg at 02/21/24 1021   polyethylene glycol (MIRALAX  / GLYCOLAX ) packet 17 g  17 g Oral Daily PRN Melvin, Alexander B, MD       rifaximin  (XIFAXAN ) tablet 550 mg  550 mg Oral BID Melvin, Alexander B, MD   550 mg at 02/21/24 1020   rOPINIRole  (REQUIP ) tablet 1 mg  1 mg Oral QHS  Seena Marsa NOVAK, MD   1 mg at 02/20/24 2227   sodium chloride  flush (NS) 0.9 % injection 3 mL  3 mL Intravenous Q12H Seena Marsa NOVAK, MD   3 mL at 02/21/24 1022   spironolactone  (ALDACTONE ) tablet 50 mg  50 mg Oral Daily Sheikh, Omair [place], DO   50 mg at 02/21/24 1021   temazepam  (RESTORIL ) capsule 30 mg  30 mg Oral QHS PRN Seena Marsa NOVAK, MD   30 mg at 02/20/24 2237    Allergies as of 02/19/2024 - Review Complete 02/19/2024  Allergen Reaction Noted   Vancomycin  Itching 10/04/2023   Amoxicillin Hives and Itching 02/16/2011   Azithromycin   07/25/2017   Dulaglutide  Other (See Comments) 08/21/2020   Empagliflozin -metformin  hcl er Other (See Comments) and Swelling 08/21/2020   Januvia  [sitagliptin ] Other (See Comments) 11/18/2014   Latex  07/25/2017   Metformin   12/16/2022   Morphine  Itching 01/10/2024   Penicillins Hives 02/16/2011   Vitamin k  and related Other (See Comments) 11/19/2023    Social History   Socioeconomic History   Marital status: Married    Spouse name: Adam   Number of children: 2   Years of education: Not on file   Highest education level: 12th grade   Occupational History   Occupation: Arts Administrator and Nutrition    Employer: [employer]    Comment: works 20 hours per week  Tobacco Use   Smoking status: Never    Passive exposure: Current   Smokeless tobacco: Never  Vaping Use   Vaping status: Never Used  Substance and Sexual Activity   Alcohol use: No    Alcohol/week: 0.0 standard drinks of alcohol   Drug use: No   Sexual activity: Yes    Partners: Male    Birth control/protection: None  Other Topics Concern   Not on file  Social History Narrative   Not on file   Social Drivers of Health   Financial Resource Strain: Low Risk  (09/19/2022)   Overall Financial Resource Strain (CARDIA)    Difficulty of Paying Living Expenses: Not hard at all  Food Insecurity: No Food Insecurity (02/19/2024)   Hunger Vital Sign    Worried About Radiation Protection Practitioner of Food in the Last Year: Never true    Ran Out of Food in the Last Year: Never true  Transportation Needs: No Transportation Needs (02/19/2024)   PRAPARE - Administrator, Civil Service (Medical): No    Lack of Transportation (Non-Medical): No  Physical Activity: Insufficiently Active (06/04/2021)   Exercise Vital Sign    Days of Exercise per Week: 2 days    Minutes of Exercise per Session: 10 min  Stress: Stress Concern Present (09/19/2022)   [employer] of Occupational Health - Occupational Stress Questionnaire    Feeling of Stress : To some extent  Social Connections: Socially Integrated (10/05/2023)   Social Connection and Isolation Panel    Frequency of Communication with Friends and Family: More than three times a week    Frequency of Social Gatherings with Friends and Family: Twice a week    Attends Religious Services: More than 4 times per year    Active Member of [employer] or Organizations: No    Attends Engineer, Structural: More than 4 times per year    Marital Status: Married  Catering Manager Violence: Not At Risk (02/19/2024)   Humiliation, Afraid,  Rape, and Kick questionnaire    Fear of Current or Ex-Partner: No  Emotionally Abused: No    Physically Abused: No    Sexually Abused: No     Code Status   Code Status: Full Code  Review of Systems: All systems reviewed and negative except where noted in HPI.  Physical Exam: Vital signs in last 24 hours: Temp:  [97.6 F (36.4 C)-98.6 F (37 C)] 97.6 F (36.4 C) (11/11 0822) Pulse Rate:  [81-96] 91 (11/11 0822) Resp:  [15-18] 18 (11/11 0822) BP: (102-114)/(50-59) 104/58 (11/11 0822) SpO2:  [96 %-99 %] 96 % (11/11 0822) Last BM Date : 02/20/24  General:  Pleasant female in NAD Psych:  Cooperative. Normal mood and affect Eyes: Pupils equal Ears:  Normal auditory acuity Nose: No deformity, discharge or lesions Neck:  Supple, no masses felt Lungs:  Clear to auscultation.  Heart:  Regular rate, regular rhythm.  Abdomen:  Soft, moderately distended, nontender, active bowel sounds, no masses felt Rectal :  Deferred Msk: Symmetrical without gross deformities.  Neurologic:  Alert, oriented, grossly normal neurologically. No asterixis Extremities : No edema Skin:  Intact without significant lesions.    Intake/Output from previous day: 11/10 0701 - 11/11 0700 In: 840 [P.O.:840] Out: -  Intake/Output this shift:  Total I/O In: 480 [P.O.:480] Out: -    [name], NP-C   02/21/2024, 12:13 PM

## 2024-02-22 NOTE — Discharge Summary (Signed)
 Liver Transplant Surgery Discharge Summary  Patient Name: Laurie Sutton DOB: 06/13/1980 MRN: [id number]  Admit Date: 02/21/2024   Discharge Date: Wed 02/22/2024  Attending Physician: Dr. Richardean Chief Complaint on Admission: admitted for possible liver transplant  Problem List: Problem List[1]  Consults: None    Hospital Course  Laurie Sutton is a 43 y.o. 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 Allograft utilized for primary recipient and patient discharged home in stable condition.  Physical Exam: BP 100/60   Pulse 106   Ht 1.676 m (5' 6)   Wt 81.3 kg (179 lb 3.7 oz)   BMI 28.93 kg/m  General/Neuro: alert and oriented, NAD Lungs: respirations non-labored on RA Heart: tachycardic Extremities: no BLE edema Skin: warm and dry Psych: calm and cooperative  Lab Data: CBC:  Results from last 7 days  Lab Units 02/21/24 1801  WHITE BLOOD CELL COUNT 10*3/uL 2.43*  HEMOGLOBIN g/dL 9.2*  HEMATOCRIT % 29*  PLATELET COUNT 10*3/uL 70*   CMP:  Results from last 7 days  Lab Units 02/21/24 1801  SODIUM mmol/L 128*  POTASSIUM mmol/L 3.7  CHLORIDE mmol/L 96*  CO2 mmol/L 23  ANION GAP mmol/L 9  BUN mg/dL 12  CREATININE mg/dL 9.49*  GLUCOSE mg/dL 656*  EGFR fO/fpw/8.26f7 >90  CALCIUM  mg/dL 8.6  ALBUMIN  g/dL 3.1*  AST U/L 49*  ALT U/L 22  ALP U/L 94  TOTAL PROTEIN g/dL 6.3  BILIRUBIN TOTAL mg/dL 3.6*    Discharge Medications:   Medication List     CONTINUE taking these medications    Accu-Chek Guide Me Glucose Mtr Misc Generic drug: blood-glucose meter 1 each.   Accu-Chek Guide test strips test strip Generic drug: glucose blood 1 each by Other route as needed.   baclofen 10 mg tablet Commonly known as: LIORESAL Take 10 mg by mouth once a week. after paracentesis(Wednesdays)   clotrimazole -betamethasone  1-0.05 % cream Commonly  known as: LOTRISONE  Apply 1 Application topically daily as needed. apply as needed   Dexcom G7 Sensor Generic drug: blood-glucose sensor 1 each.   ergocalciferol  1,250 mcg (50,000 unit) capsule Commonly known as: VITAMIN D2 Take 50,000 Units by mouth once a week. Wednesdays   ferrous sulfate 325 mg (65 mg iron) tablet Take 1 tablet (325 mg total) by mouth daily before breakfast.   FLUoxetine  40 mg capsule Commonly known as: PROzac  Take 40 mg by mouth daily.   furosemide  20 mg tablet Commonly known as: LASIX  Take 1 tablet (20 mg total) by mouth daily.   HumuLIN  R U-500 (Conc) Insulin  500 unit/mL injection Generic drug: insulin  regular U-500 CONCENTRATED Inject under the skin. 65 units before breakfast, 60 units before lunch, 60 before dinner.  If not eating the meal take half the dose.   HYDROcodone -acetaminophen  5-325 mg per tablet Commonly known as: NORCO Take 1 tablet by mouth every 6 (six) hours as needed for severe pain (7-10) or moderate pain (4-6).   lactulose  10 gram/15 mL solution Commonly known as: CHRONULAC  Take 30 mL (20 g total) by mouth 2 (two) times a day for 10 days.   midodrine  5 mg tablet Commonly known as: PROAMATINE  Take 3 tablets (15 mg total) by mouth in the morning and 3 tablets (15 mg total) at noon and 3 tablets (15 mg total) in the evening. Take before meals.   montelukast  10 mg tablet Commonly known as: SINGULAIR  Take 10 mg by mouth  at bedtime.   omeprazole  40 mg DR capsule Commonly known as: PriLOSEC Take 40 mg by mouth daily.   ondansetron  4 mg disintegrating tablet Commonly known as: ZOFRAN -ODT Dissolve 1 tablet on tongue every 8 (eight) hours as needed.   rifAXIMin  550 mg tablet Commonly known as: XIFAXAN  Take 1 tablet (550 mg total) by mouth 2 (two) times a day.   rOPINIRole  1 mg tablet Commonly known as: REQUIP  Take 1 mg by mouth at bedtime.   spironolactone  25 mg tablet Commonly known as: ALDACTONE  Take 1 tablet (25 mg  total) by mouth daily.   temazepam  30 mg capsule Commonly known as: RESTORIL  Take 30 mg by mouth nightly as needed for sleep.   zinc sulfate 220 mg capsule Commonly known as: ZINCATE Take 1 capsule (220 mg total) by mouth 2 (two) times a day.       ASK your doctor about these medications    nitrofurantoin  (macrocrystal-monohydrate) 100 mg capsule Commonly known as: MACROBID  Take 100 mg by mouth 2 (two) times a day.   pantoprazole  40 mg EC tablet Commonly known as: PROTONIX  Take 40 mg by mouth 2 (two) times a day.        Discharge Instructions: -Please resume all pre-hospitalization medications and appointments  Future Appointments  Date Time Provider Department Center  05/21/2024  2:45 PM Dawn DELENA [facility], NP [facility] None   Condition on Discharge: stable  Discharge Disposition: Discharged to home  This discharge process has taken less than 30 minutes.   Signed Electronically: Cristino LOISE Bays, PA-C Abdominal Transplant Surgery 02/22/2024        [1] Patient Active Problem List Diagnosis  . Diabetes mellitus without complication    (CMD)  . GERD (gastroesophageal reflux disease)  . Other cirrhosis of liver  . Metabolic dysfunction-associated steatohepatitis (MASH)  . Portal hypertension with esophageal varices    (CMD)  . ADHD  . Anxiety  . Depression  . Diabetic neuropathy    (CMD)  . Other ascites  . Mild protein-calorie malnutrition (CMD)  . Hepatic encephalopathy    (CMD)  . Neoplasm of uncertain behavior of liver  . Hypotension, unspecified  . Muscle cramps  . Hx of C diff  . Restless leg syndrome  . Diastolic dysfunction  . Decompensated hepatic cirrhosis    (CMD)  . Abdominal pain  . Fever  . Thrombocytopenia  . Hypokalemia  . Anemia  . Encounter for counseling  . End stage liver disease    (CMD)

## 2024-02-23 LAB — BODY FLUID CULTURE W GRAM STAIN
Culture: NO GROWTH
Gram Stain: NONE SEEN

## 2024-02-26 ENCOUNTER — Other Ambulatory Visit: Payer: Self-pay

## 2024-02-26 ENCOUNTER — Inpatient Hospital Stay (HOSPITAL_COMMUNITY)
Admission: EM | Admit: 2024-02-26 | Discharge: 2024-02-29 | DRG: 432 | Disposition: A | Discharge status: Home / Self Care | Attending: Internal Medicine | Admitting: Internal Medicine

## 2024-02-26 ENCOUNTER — Encounter (HOSPITAL_COMMUNITY): Payer: Self-pay | Admitting: *Deleted

## 2024-02-26 ENCOUNTER — Emergency Department (HOSPITAL_COMMUNITY)

## 2024-02-26 DIAGNOSIS — K219 Gastro-esophageal reflux disease without esophagitis: Secondary | ICD-10-CM | POA: Diagnosis present

## 2024-02-26 DIAGNOSIS — D61818 Other pancytopenia: Secondary | ICD-10-CM | POA: Diagnosis present

## 2024-02-26 DIAGNOSIS — D689 Coagulation defect, unspecified: Secondary | ICD-10-CM | POA: Diagnosis present

## 2024-02-26 DIAGNOSIS — Z90710 Acquired absence of both cervix and uterus: Secondary | ICD-10-CM

## 2024-02-26 DIAGNOSIS — Z888 Allergy status to other drugs, medicaments and biological substances status: Secondary | ICD-10-CM

## 2024-02-26 DIAGNOSIS — E876 Hypokalemia: Secondary | ICD-10-CM | POA: Diagnosis present

## 2024-02-26 DIAGNOSIS — F419 Anxiety disorder, unspecified: Secondary | ICD-10-CM | POA: Diagnosis present

## 2024-02-26 DIAGNOSIS — F909 Attention-deficit hyperactivity disorder, unspecified type: Secondary | ICD-10-CM | POA: Diagnosis present

## 2024-02-26 DIAGNOSIS — Z8616 Personal history of COVID-19: Secondary | ICD-10-CM

## 2024-02-26 DIAGNOSIS — Z7682 Awaiting organ transplant status: Secondary | ICD-10-CM

## 2024-02-26 DIAGNOSIS — Z825 Family history of asthma and other chronic lower respiratory diseases: Secondary | ICD-10-CM

## 2024-02-26 DIAGNOSIS — R278 Other lack of coordination: Secondary | ICD-10-CM | POA: Diagnosis present

## 2024-02-26 DIAGNOSIS — K7460 Unspecified cirrhosis of liver: Principal | ICD-10-CM | POA: Diagnosis present

## 2024-02-26 DIAGNOSIS — K7682 Hepatic encephalopathy: Secondary | ICD-10-CM | POA: Diagnosis present

## 2024-02-26 DIAGNOSIS — R109 Unspecified abdominal pain: Secondary | ICD-10-CM | POA: Diagnosis not present

## 2024-02-26 DIAGNOSIS — Z818 Family history of other mental and behavioral disorders: Secondary | ICD-10-CM

## 2024-02-26 DIAGNOSIS — K7210 Chronic hepatic failure without coma: Secondary | ICD-10-CM | POA: Diagnosis present

## 2024-02-26 DIAGNOSIS — Z83719 Family history of colon polyps, unspecified: Secondary | ICD-10-CM

## 2024-02-26 DIAGNOSIS — Z794 Long term (current) use of insulin: Secondary | ICD-10-CM | POA: Diagnosis not present

## 2024-02-26 DIAGNOSIS — Z833 Family history of diabetes mellitus: Secondary | ICD-10-CM | POA: Diagnosis not present

## 2024-02-26 DIAGNOSIS — Z881 Allergy status to other antibiotic agents status: Secondary | ICD-10-CM | POA: Diagnosis not present

## 2024-02-26 DIAGNOSIS — E7220 Disorder of urea cycle metabolism, unspecified: Secondary | ICD-10-CM | POA: Diagnosis not present

## 2024-02-26 DIAGNOSIS — E1140 Type 2 diabetes mellitus with diabetic neuropathy, unspecified: Secondary | ICD-10-CM | POA: Diagnosis present

## 2024-02-26 DIAGNOSIS — G2581 Restless legs syndrome: Secondary | ICD-10-CM | POA: Diagnosis present

## 2024-02-26 DIAGNOSIS — Z8052 Family history of malignant neoplasm of bladder: Secondary | ICD-10-CM

## 2024-02-26 DIAGNOSIS — Z885 Allergy status to narcotic agent status: Secondary | ICD-10-CM

## 2024-02-26 DIAGNOSIS — R9431 Abnormal electrocardiogram [ECG] [EKG]: Secondary | ICD-10-CM | POA: Diagnosis present

## 2024-02-26 DIAGNOSIS — K7581 Nonalcoholic steatohepatitis (NASH): Secondary | ICD-10-CM | POA: Diagnosis present

## 2024-02-26 DIAGNOSIS — F32A Depression, unspecified: Secondary | ICD-10-CM | POA: Diagnosis present

## 2024-02-26 DIAGNOSIS — Z880 Allergy status to penicillin: Secondary | ICD-10-CM | POA: Diagnosis not present

## 2024-02-26 DIAGNOSIS — E1165 Type 2 diabetes mellitus with hyperglycemia: Secondary | ICD-10-CM | POA: Diagnosis present

## 2024-02-26 DIAGNOSIS — E871 Hypo-osmolality and hyponatremia: Secondary | ICD-10-CM | POA: Diagnosis not present

## 2024-02-26 DIAGNOSIS — Z79899 Other long term (current) drug therapy: Secondary | ICD-10-CM

## 2024-02-26 DIAGNOSIS — Z91040 Latex allergy status: Secondary | ICD-10-CM

## 2024-02-26 DIAGNOSIS — Z8261 Family history of arthritis: Secondary | ICD-10-CM

## 2024-02-26 DIAGNOSIS — J9601 Acute respiratory failure with hypoxia: Secondary | ICD-10-CM | POA: Diagnosis present

## 2024-02-26 DIAGNOSIS — Z87442 Personal history of urinary calculi: Secondary | ICD-10-CM

## 2024-02-26 DIAGNOSIS — K7469 Other cirrhosis of liver: Secondary | ICD-10-CM | POA: Diagnosis not present

## 2024-02-26 DIAGNOSIS — E8770 Fluid overload, unspecified: Secondary | ICD-10-CM | POA: Diagnosis present

## 2024-02-26 DIAGNOSIS — Z9079 Acquired absence of other genital organ(s): Secondary | ICD-10-CM

## 2024-02-26 DIAGNOSIS — I9589 Other hypotension: Secondary | ICD-10-CM | POA: Diagnosis present

## 2024-02-26 DIAGNOSIS — R188 Other ascites: Secondary | ICD-10-CM | POA: Diagnosis present

## 2024-02-26 LAB — COMPREHENSIVE METABOLIC PANEL WITH GFR
ALT: 31 U/L (ref 0–44)
AST: 47 U/L — ABNORMAL HIGH (ref 15–41)
Albumin: 2.7 g/dL — ABNORMAL LOW (ref 3.5–5.0)
Alkaline Phosphatase: 108 U/L (ref 38–126)
Anion gap: 10 (ref 5–15)
BUN: 9 mg/dL (ref 6–20)
CO2: 22 mmol/L (ref 22–32)
Calcium: 8.1 mg/dL — ABNORMAL LOW (ref 8.9–10.3)
Chloride: 94 mmol/L — ABNORMAL LOW (ref 98–111)
Creatinine, Ser: 0.74 mg/dL (ref 0.44–1.00)
GFR, Estimated: >60 mL/min (ref >60)
Glucose, Bld: 387 mg/dL — ABNORMAL HIGH (ref 70–99)
Potassium: 3.3 mmol/L — ABNORMAL LOW (ref 3.5–5.1)
Sodium: 126 mmol/L — ABNORMAL LOW (ref 135–145)
Total Bilirubin: 5.3 mg/dL — ABNORMAL HIGH (ref 0.0–1.2)
Total Protein: 6.3 g/dL — ABNORMAL LOW (ref 6.5–8.1)

## 2024-02-26 LAB — GLUCOSE, PLEURAL OR PERITONEAL FLUID: Glucose, Fluid: 376 mg/dL

## 2024-02-26 LAB — CBC
HCT: 29.2 % — ABNORMAL LOW (ref 36.0–46.0)
Hemoglobin: 9.2 g/dL — ABNORMAL LOW (ref 12.0–15.0)
MCH: 27.6 pg (ref 26.0–34.0)
MCHC: 31.5 g/dL (ref 30.0–36.0)
MCV: 87.7 fL (ref 80.0–100.0)
Platelets: 78 K/uL — ABNORMAL LOW (ref 150–400)
RBC: 3.33 MIL/uL — ABNORMAL LOW (ref 3.87–5.11)
RDW: 17.1 % — ABNORMAL HIGH (ref 11.5–15.5)
WBC: 3.4 K/uL — ABNORMAL LOW (ref 4.0–10.5)
nRBC: 0 % (ref 0.0–0.2)

## 2024-02-26 LAB — CBG MONITORING, ED
Glucose-Capillary: 347 mg/dL — ABNORMAL HIGH (ref 70–99)
Glucose-Capillary: 234 mg/dL — ABNORMAL HIGH (ref 70–99)
Glucose-Capillary: 236 mg/dL — ABNORMAL HIGH (ref 70–99)
Glucose-Capillary: 205 mg/dL — ABNORMAL HIGH (ref 70–99)

## 2024-02-26 LAB — PROTEIN, PLEURAL OR PERITONEAL FLUID: Total protein, fluid: <3 g/dL

## 2024-02-26 LAB — ALBUMIN, PLEURAL OR PERITONEAL FLUID: Albumin, Fluid: <1.5 g/dL

## 2024-02-26 LAB — PROTIME-INR
INR: 1.7 — ABNORMAL HIGH (ref 0.8–1.2)
Prothrombin Time: 20.6 s — ABNORMAL HIGH (ref 11.4–15.2)

## 2024-02-26 LAB — LACTATE DEHYDROGENASE, PLEURAL OR PERITONEAL FLUID: LD, Fluid: <25 U/L — ABNORMAL HIGH (ref 3–23)

## 2024-02-26 LAB — LIPASE, BLOOD: Lipase: 45 U/L (ref 11–51)

## 2024-02-26 LAB — AMMONIA: Ammonia: 75 umol/L — ABNORMAL HIGH (ref 9–35)

## 2024-02-26 LAB — SAMPLE TO BLOOD BANK

## 2024-02-26 MED ORDER — SPIRONOLACTONE 25 MG PO TABS
50.0000 mg | ORAL_TABLET | Freq: Every day | ORAL | Status: DC
Start: 2024-02-26 — End: 2024-02-26
  Administered 2024-02-26: 50 mg via ORAL
  Filled 2024-02-26: qty 2

## 2024-02-26 MED ORDER — RIFAXIMIN 550 MG PO TABS
550.0000 mg | ORAL_TABLET | Freq: Two times a day (BID) | ORAL | Status: DC
  Administered 2024-02-26 – 2024-02-29 (×7): 550 mg via ORAL
  Filled 2024-02-26 (×8): qty 1

## 2024-02-26 MED ORDER — PANTOPRAZOLE SODIUM 40 MG PO TBEC
40.0000 mg | DELAYED_RELEASE_TABLET | Freq: Two times a day (BID) | ORAL | Status: DC
  Administered 2024-02-26 – 2024-02-29 (×7): 40 mg via ORAL
  Filled 2024-02-26 (×7): qty 1

## 2024-02-26 MED ORDER — LORAZEPAM 1 MG PO TABS: 1.0000 mg | ORAL_TABLET | Freq: Three times a day (TID) | ORAL | Status: DC | PRN

## 2024-02-26 MED ORDER — POLYETHYLENE GLYCOL 3350 17 G PO PACK: 17.0000 g | PACK | Freq: Every day | ORAL | Status: DC | PRN

## 2024-02-26 MED ORDER — ROPINIROLE HCL 1 MG PO TABS
1.0000 mg | ORAL_TABLET | Freq: Every day | ORAL | Status: DC
  Administered 2024-02-26 – 2024-02-28 (×3): 1 mg via ORAL
  Filled 2024-02-26 (×3): qty 1

## 2024-02-26 MED ORDER — ALBUMIN HUMAN 25 % IV SOLN
25.0000 g | Freq: Once | INTRAVENOUS | Status: AC
  Administered 2024-02-26: 25 g via INTRAVENOUS
  Filled 2024-02-26: qty 100

## 2024-02-26 MED ORDER — LACTULOSE 10 GM/15ML PO SOLN
20.0000 g | Freq: Three times a day (TID) | ORAL | Status: DC
  Administered 2024-02-26 – 2024-02-27 (×4): 20 g via ORAL
  Filled 2024-02-26 (×4): qty 30

## 2024-02-26 MED ORDER — LACTULOSE 10 GM/15ML PO SOLN: 20.0000 g | Freq: Two times a day (BID) | ORAL | Status: DC

## 2024-02-26 MED ORDER — MIDODRINE HCL 5 MG PO TABS
15.0000 mg | ORAL_TABLET | Freq: Three times a day (TID) | ORAL | Status: DC
  Administered 2024-02-26 – 2024-02-29 (×11): 15 mg via ORAL
  Filled 2024-02-26 (×11): qty 3

## 2024-02-26 MED ORDER — INSULIN ASPART 100 UNIT/ML IJ SOLN
0.0000 [IU] | Freq: Every day | INTRAMUSCULAR | Status: DC
  Administered 2024-02-26: 2 [IU] via SUBCUTANEOUS
  Administered 2024-02-27 – 2024-02-28 (×2): 3 [IU] via SUBCUTANEOUS
  Filled 2024-02-26: qty 2
  Filled 2024-02-26: qty 3
  Filled 2024-02-26: qty 2

## 2024-02-26 MED ORDER — FUROSEMIDE 20 MG PO TABS
20.0000 mg | ORAL_TABLET | Freq: Every day | ORAL | Status: DC
Start: 2024-02-26 — End: 2024-02-26
  Administered 2024-02-26: 20 mg via ORAL
  Filled 2024-02-26: qty 1

## 2024-02-26 MED ORDER — MIDODRINE HCL 5 MG PO TABS: 15.0000 mg | ORAL_TABLET | Freq: Three times a day (TID) | ORAL | Status: DC

## 2024-02-26 MED ORDER — FENTANYL CITRATE (PF) 50 MCG/ML IJ SOSY
100.0000 ug | PREFILLED_SYRINGE | Freq: Once | INTRAMUSCULAR | Status: AC
  Administered 2024-02-26: 100 ug via INTRAVENOUS
  Filled 2024-02-26: qty 2

## 2024-02-26 MED ORDER — INSULIN ASPART 100 UNIT/ML IJ SOLN
0.0000 [IU] | Freq: Three times a day (TID) | INTRAMUSCULAR | Status: DC
  Administered 2024-02-26: 3 [IU] via SUBCUTANEOUS
  Administered 2024-02-26: 7 [IU] via SUBCUTANEOUS
  Administered 2024-02-26: 3 [IU] via SUBCUTANEOUS
  Administered 2024-02-27: 1 [IU] via SUBCUTANEOUS
  Administered 2024-02-27 (×2): 3 [IU] via SUBCUTANEOUS
  Administered 2024-02-28: 2 [IU] via SUBCUTANEOUS
  Administered 2024-02-28: 3 [IU] via SUBCUTANEOUS
  Administered 2024-02-28: 5 [IU] via SUBCUTANEOUS
  Filled 2024-02-26 (×3): qty 3
  Filled 2024-02-26: qty 2
  Filled 2024-02-26: qty 7
  Filled 2024-02-26: qty 3
  Filled 2024-02-26: qty 1
  Filled 2024-02-26: qty 7

## 2024-02-26 MED ORDER — DRONABINOL 2.5 MG PO CAPS: 2.5000 mg | ORAL_CAPSULE | Freq: Two times a day (BID) | ORAL | Status: DC | PRN

## 2024-02-26 MED ORDER — FENTANYL CITRATE (PF) 50 MCG/ML IJ SOSY
25.0000 ug | PREFILLED_SYRINGE | INTRAMUSCULAR | Status: DC | PRN
  Administered 2024-02-26 – 2024-02-29 (×9): 25 ug via INTRAVENOUS
  Filled 2024-02-26 (×10): qty 1

## 2024-02-26 MED ORDER — FENTANYL CITRATE (PF) 50 MCG/ML IJ SOSY
50.0000 ug | PREFILLED_SYRINGE | Freq: Once | INTRAMUSCULAR | Status: AC
  Administered 2024-02-26: 50 ug via INTRAVENOUS
  Filled 2024-02-26: qty 1

## 2024-02-26 MED ORDER — PROCHLORPERAZINE EDISYLATE 10 MG/2ML IJ SOLN
5.0000 mg | Freq: Four times a day (QID) | INTRAMUSCULAR | Status: DC | PRN
  Administered 2024-02-26: 5 mg via INTRAVENOUS
  Filled 2024-02-26: qty 2

## 2024-02-26 MED ORDER — SODIUM CHLORIDE 0.9 % IV SOLN
2.0000 g | Freq: Every day | INTRAVENOUS | Status: DC
  Administered 2024-02-26 – 2024-02-29 (×4): 2 g via INTRAVENOUS
  Filled 2024-02-26 (×4): qty 20

## 2024-02-26 MED ORDER — FLUOXETINE HCL 20 MG PO CAPS
40.0000 mg | ORAL_CAPSULE | Freq: Two times a day (BID) | ORAL | Status: DC
  Administered 2024-02-26 – 2024-02-29 (×7): 40 mg via ORAL
  Filled 2024-02-26 (×7): qty 2

## 2024-02-26 MED ORDER — POTASSIUM CHLORIDE 10 MEQ/100ML IV SOLN
10.0000 meq | INTRAVENOUS | Status: AC
  Administered 2024-02-26 (×2): 10 meq via INTRAVENOUS
  Filled 2024-02-26 (×2): qty 100

## 2024-02-26 MED ORDER — LORAZEPAM 2 MG/ML IJ SOLN
1.0000 mg | Freq: Once | INTRAMUSCULAR | Status: AC
  Administered 2024-02-26: 1 mg via INTRAVENOUS
  Filled 2024-02-26: qty 1

## 2024-02-26 MED ORDER — LACTULOSE 10 GM/15ML PO SOLN
20.0000 g | Freq: Once | ORAL | Status: AC
Start: 2024-02-26 — End: 2024-02-26
  Administered 2024-02-26: 20 g via ORAL
  Filled 2024-02-26: qty 30

## 2024-02-26 MED ORDER — ALBUMIN HUMAN 25 % IV SOLN
25.0000 g | Freq: Four times a day (QID) | INTRAVENOUS | Status: AC
  Administered 2024-02-26 (×3): 25 g via INTRAVENOUS
  Filled 2024-02-26 (×3): qty 100

## 2024-02-26 NOTE — ED Notes (Signed)
CBG 234  

## 2024-02-26 NOTE — Consult Note (Addendum)
 Reason for Consult: Recurrent ascities, MASH cirrhosis Referring Physician: [facility]  Hospitalist  Alan GORMAN Boron HPI: This is a 43 year old female with a PMH of refractory ascities requiring frequent paracentesis secondary to MASH cirrhosis, history of SBP, HE, esophageal varices, hyponatremia, and DM admitted for abdominal pain.  She was recently discharged on 02/21/2024 after having an LVP.  At that time she presented with the same symptoms of abdominal pain.  There was discussion about transferring to [facility] as she is high on the transplant list, but the recommendation was for her to wait at home.  A transplantation did not occur and she returns to the hospital for abdominal distension and abdominal pain.  With her pain there was concern for SBP and she was started on ceftriaxone .  The patient is reported to have a standing order for weekly paracenteses.    Past Medical History:  Diagnosis Date   ADHD    Allergy    Anemia    IRON TRANSFUSION 07-2020 NONE SINCE   Anxiety    Back pain    COVID 08/12/2019   ALL SYMPTOMS REOLVED PER PT   Depression    Diabetic neuropathy (HCC) 11/11/2020   FEET   dm type 2    sees Dr. Odella Jacobson at [facility]   Fatty liver    GERD (gastroesophageal reflux disease)    History of kidney stones    Joint pain    Menorrhagia 11/11/2020   Migraines    Murmur, cardiac    FAINT NO CARDIOLOGIST   Other fatigue    Pneumonia 08/12/2019   COVID PNEUMONIA ALL SYMPTOMS RESOLVED PER PT   Sepsis (HCC) 10/04/2023   Shortness of breath on exertion     Past Surgical History:  Procedure Laterality Date   CESAREAN SECTION  12/13/2006   COLONOSCOPY WITH PROPOFOL  N/A 08/02/2023   Procedure: COLONOSCOPY WITH PROPOFOL ;  Surgeon: Legrand Victory LITTIE DOUGLAS, MD;  Location: [facility] ENDOSCOPY;  Service: Gastroenterology;  Laterality: N/A;   ESOPHAGOGASTRODUODENOSCOPY N/A 02/16/2024   Procedure: EGD (ESOPHAGOGASTRODUODENOSCOPY);  Surgeon: Albertus Gordy HERO, MD;  Location: [facility]  ENDOSCOPY;  Service: Gastroenterology;  Laterality: N/A;   ESOPHAGOGASTRODUODENOSCOPY (EGD) WITH PROPOFOL  N/A 08/02/2023   Procedure: ESOPHAGOGASTRODUODENOSCOPY (EGD) WITH PROPOFOL ;  Surgeon: Legrand Victory LITTIE DOUGLAS, MD;  Location: [facility] ENDOSCOPY;  Service: Gastroenterology;  Laterality: N/A;   EXTRACORPOREAL SHOCK WAVE LITHOTRIPSY  2018   FLEXIBLE SIGMOIDOSCOPY N/A 11/20/2023   Procedure: KINGSTON SIDE;  Surgeon: Albertus Gordy HERO, MD;  Location: [facility] ENDOSCOPY;  Service: Gastroenterology;  Laterality: N/A;   FOOT SURGERY Right 10/02/2020   RIGHT FOOT HEEL AND TOES, SUR[facility]ICAL [facility]ENTER OFF [address]   IR PARACENTESIS  07/06/2023   IR PARACENTESIS  08/18/2023   IR PARACENTESIS  10/05/2023   IR PARACENTESIS  10/11/2023   IR PARACENTESIS  10/20/2023   IR PARACENTESIS  11/08/2023   IR PARACENTESIS  11/28/2023   IR PARACENTESIS  12/09/2023   IR PARACENTESIS  12/15/2023   IR PARACENTESIS  12/16/2023   IR PARACENTESIS  12/23/2023   IR PARACENTESIS  12/30/2023   IR PARACENTESIS  01/06/2024   IR PARACENTESIS  01/06/2024   IR PARACENTESIS  01/10/2024   IR PARACENTESIS  01/13/2024   IR PARACENTESIS  01/20/2024   IR PARACENTESIS  01/27/2024   IR PARACENTESIS  02/01/2024   IR PARACENTESIS  02/03/2024   IR PARACENTESIS  02/08/2024   IR PARACENTESIS  02/15/2024   IR PARACENTESIS  02/20/2024   IR TRANSCATHETER BX  07/26/2023   IR  US  GUIDE VASC ACCESS RIGHT  07/26/2023   IR VENOGRAM HEPATIC W HEMODYNAMIC EVALUATION  07/26/2023   LAPAROSCOPIC VAGINAL HYSTERECTOMY WITH SALPINGECTOMY Bilateral 11/17/2020   Procedure: LAPAROSCOPIC ASSISTED VAGINAL HYSTERECTOMY WITH BILATERAL SALPINGECTOMY;  Surgeon: Dannielle Bouchard, DO;  Location: [facility];  Service: Gynecology;  Laterality: Bilateral;   POLYPECTOMY  08/02/2023   Procedure: POLYPECTOMY, INTESTINE;  Surgeon: Legrand Victory LITTIE DOUGLAS, MD;  Location: [facility];  Service: Gastroenterology;;    Family History  Problem Relation Age of Onset   Colon polyps Mother     Depression Mother    Anxiety disorder Mother    Colon polyps Father    Sleep apnea Father    Anxiety disorder Father    Depression Father    Diabetes Father    Bladder Cancer Father 71   Rheum arthritis Father    Migraines Maternal Aunt    Migraines Maternal Grandmother    Diabetes Maternal Grandfather    Healthy Daughter    Asthma Daughter    Anxiety disorder Daughter    Anxiety disorder Son    Asthma Son    Healthy Son    Cerebral aneurysm Cousin    Heart disease Other    Depression Other    Anxiety disorder Other    Sleep apnea Other    Colon cancer Neg Hx    Esophageal cancer Neg Hx    Stomach cancer Neg Hx    Rectal cancer Neg Hx     Social History:  reports that she has never smoked. She has been exposed to tobacco smoke. She has never used smokeless tobacco. She reports that she does not drink alcohol and does not use drugs.  Allergies:  Allergies  Allergen Reactions   Vancomycin  Itching   Amoxicillin Hives and Itching   Azithromycin      DOES NOT WORK   Dulaglutide  Other (See Comments)    constipation and stomach issues  **Trulicity **    Empagliflozin -Metformin  Hcl Er Other (See Comments) and Swelling   Januvia  [Sitagliptin ] Other (See Comments)    HURTS STOAMCH   Latex     IRRITATES VAGINAL AREA   Metformin      Other Reaction(s): achy all over   Morphine  Itching    Severe    Penicillins Hives    Has patient had a PCN reaction causing immediate rash, facial/tongue/throat swelling, SOB or lightheadedness with hypotension: yes. Rash  Has patient had a PCN reaction causing severe rash involving mucus membranes or skin necrosis: Yes- rash and hives all over body  Has patient had a PCN reaction that required hospitalization No Has patient had a PCN reaction occurring within the last 10 years: Yes  If all of the above answers are NO, then may proceed with Cephalosporin use.    Vitamin K  And Related Other (See Comments)    Electric shock      Medications: Scheduled:  FLUoxetine   40 mg Oral BID   furosemide   20 mg Oral Daily   insulin  aspart  0-5 Units Subcutaneous QHS   insulin  aspart  0-9 Units Subcutaneous TID WC   lactulose   20 g Oral BID   midodrine   15 mg Oral TID with meals   pantoprazole   40 mg Oral BID   rifaximin   550 mg Oral BID   rOPINIRole   1 mg Oral QHS   spironolactone   50 mg Oral Daily   Continuous:  albumin  human 25 g (02/26/24 0753)   cefTRIAXone  (ROCEPHIN )  IV Stopped (02/26/24 0835)  potassium chloride  10 mEq (02/26/24 1010)    Results for orders placed or performed during the hospital encounter of 02/26/24 (from the past 24 hours)  Sample to Blood Bank     Status: None   Collection Time: 02/26/24 12:20 AM  Result Value Ref Range   Blood Bank Specimen SAMPLE AVAILABLE FOR TESTING    Sample Expiration      02/29/2024,2359 Performed at [facility], [address] [place], [place] [address]   Lipase, blood     Status: None   Collection Time: 02/26/24 12:33 AM  Result Value Ref Range   Lipase 45 11 - 51 U/L  Comprehensive metabolic panel     Status: Abnormal   Collection Time: 02/26/24 12:33 AM  Result Value Ref Range   Sodium 126 (L) 135 - 145 mmol/L   Potassium 3.3 (L) 3.5 - 5.1 mmol/L   Chloride 94 (L) 98 - 111 mmol/L   CO2 22 22 - 32 mmol/L   Glucose, Bld 387 (H) 70 - 99 mg/dL   BUN 9 6 - 20 mg/dL   Creatinine, Ser 9.25 0.44 - 1.00 mg/dL   Calcium  8.1 (L) 8.9 - 10.3 mg/dL   Total Protein 6.3 (L) 6.5 - 8.1 g/dL   Albumin  2.7 (L) 3.5 - 5.0 g/dL   AST 47 (H) 15 - 41 U/L   ALT 31 0 - 44 U/L   Alkaline Phosphatase 108 38 - 126 U/L   Total Bilirubin 5.3 (H) 0.0 - 1.2 mg/dL   GFR, Estimated >39 >39 mL/min   Anion gap 10 5 - 15  CBC     Status: Abnormal   Collection Time: 02/26/24 12:33 AM  Result Value Ref Range   WBC 3.4 (L) 4.0 - 10.5 K/uL   RBC 3.33 (L) 3.87 - 5.11 MIL/uL   Hemoglobin 9.2 (L) 12.0 - 15.0 g/dL   HCT 70.7 (L) 63.9 - 53.9 %   MCV 87.7 80.0 - 100.0 fL    MCH 27.6 26.0 - 34.0 pg   MCHC 31.5 30.0 - 36.0 g/dL   RDW 82.8 (H) 88.4 - 84.4 %   Platelets 78 (L) 150 - 400 K/uL   nRBC 0.0 0.0 - 0.2 %  Protime-INR     Status: Abnormal   Collection Time: 02/26/24  3:00 AM  Result Value Ref Range   Prothrombin Time 20.6 (H) 11.4 - 15.2 seconds   INR 1.7 (H) 0.8 - 1.2  Ammonia     Status: Abnormal   Collection Time: 02/26/24  3:09 AM  Result Value Ref Range   Ammonia 75 (H) 9 - 35 umol/L  Lactate dehydrogenase (pleural or peritoneal fluid)     Status: Abnormal   Collection Time: 02/26/24  5:08 AM  Result Value Ref Range   LD, Fluid <25 (H) 3 - 23 U/L   Fluid Type-FLDH PERITONEAL CAVITY   Glucose, pleural or peritoneal fluid     Status: None   Collection Time: 02/26/24  5:08 AM  Result Value Ref Range   Glucose, Fluid 376 mg/dL   Fluid Type-FGLU PERITONEAL CAVITY   Protein, pleural or peritoneal fluid     Status: None   Collection Time: 02/26/24  5:08 AM  Result Value Ref Range   Total protein, fluid <3.0 g/dL   Fluid Type-FTP PERITONEAL CAVITY   Albumin , pleural or peritoneal fluid      Status: None   Collection Time: 02/26/24  5:08 AM  Result Value Ref Range   Albumin ,  Fluid <1.5 g/dL   Fluid Type-FALB PERITONEAL CAVITY   Body fluid culture w Gram Stain     Status: None (Preliminary result)   Collection Time: 02/26/24  5:08 AM   Specimen: Peritoneal Cavity; Peritoneal Fluid  Result Value Ref Range   Specimen Description PERITONEAL CAVITY    Special Requests NONE    Gram Stain      NO WBC SEEN NO ORGANISMS SEEN CYTOSPIN SMEAR Performed at [facility], [address] [place], [place] [address]    Culture PENDING    Report Status PENDING   CBG monitoring, ED     Status: Abnormal   Collection Time: 02/26/24  7:28 AM  Result Value Ref Range   Glucose-Capillary 347 (H) 70 - 99 mg/dL   *Note: Due to a large number of results and/or encounters for the requested time period, some results have not been displayed. A complete set  of results can be found in Results Review.     DG Chest Portable 1 View Result Date: 02/26/2024 EXAM: 1 VIEW(S) XRAY OF THE CHEST 02/26/2024 02:36:00 AM COMPARISON: None available. CLINICAL HISTORY: eval for cough FINDINGS: LUNGS AND PLEURA: Pulmonary hypoinflation. Left basilar atelectasis. No pleural effusion. No pneumothorax. HEART AND MEDIASTINUM: No acute abnormality of the cardiac and mediastinal silhouettes. BONES AND SOFT TISSUES: No acute osseous abnormality. IMPRESSION: 1. Pulmonary hypoinflation and left basilar atelectasis. Electronically signed by: Dorethia Molt MD 02/26/2024 02:48 AM EST RP Workstation: [id number]    ROS:  As stated above in the HPI otherwise negative.  Blood pressure (!) 109/59, pulse 98, temperature 98.2 F (36.8 C), temperature source Oral, resp. rate 20, height 5' 6 (1.676 m), weight 83.5 kg, last menstrual period 11/13/2020, SpO2 96%.    PE: Gen: Uncomfortable, Oriented HEENT:  /AT, EOMI Neck: Supple, no LAD Lungs: CTA Bilaterally CV: RRR without M/G/R ABD: Distended, firm, massive ascites, +BS Ext: 1-2+ pitting edema  Assessment/Plan: 1) Decompensated cirrhosis - MELD 3.0 - 26. 2) Recurrent ascites. 3) Hyponatremia. 4) Hypotension on midodrine .   The patient suffers with rapid reaccumulation of her ascites and she is high on the transplant list.  She reports that she follows a <2 g sodium diet.  Routine she is scheduled for once/week paracentesis, but it seems that she may need twice per week while pending the liver transplantation.  Fortunately there is no evidence of any infection, AKI, or HRS-AKI.  She reports that the pain this time is on the right side and it seems different.  It is prudent to ensure that she does not have SBP as infections will derail her chances for a liver transplantation.  The patient was discontinued off of diuretics as she developed hypotension.  The hyponatremia worsened since her last admission.  This AMs paracentesis  did help to improve her abdominal pain.  It is believed that the pain is from the ascitic fluid distending her abdomen.  Plan: 1) Discontinue diuretics. 2) Order cell count.  The order history did not show that this was ordered. 3) Fluid restrict with her hyponatremia. 4) Minimize IV fluids. 5) Repeat LVP paracentesis while maintaining albumin . 6) Maintain CTX for now. 7) Daily weights to help time her paracenteses. 8) LMWH rather than SCDs.  VTE prophylaxis is beneficial in cirrhotics in the absence of active or recent bleeding.  Kagan Mutchler D 02/26/2024, 10:57 AM

## 2024-02-26 NOTE — ED Triage Notes (Signed)
 Pt c/o abdominal pain and throwing up blood starting tonight. Pt is on a liver transplant.

## 2024-02-26 NOTE — ED Notes (Signed)
 CCMD called.

## 2024-02-26 NOTE — ED Triage Notes (Signed)
 On a liver transplant list  she has had pain in her bad for the past hour vomited dark pink liquid abd pain

## 2024-02-26 NOTE — ED Provider Notes (Signed)
 [place] EMERGENCY DEPARTMENT AT [facility] Provider Note   CSN: [id number] Arrival date & time: 02/26/24  0005     Patient presents with: No chief complaint on file.   Laurie Sutton is a 43 y.o. female.   The patient is a woman who presents with severe abdominal pain and hemoptysis. She reports the onset of symptoms earlier today, describing the abdominal pain as excruciating, particularly on the right side near the liver, with a sensation of something sticking in her. The pain is associated with difficulty breathing and increased abdominal swelling. She has a history of frequent paracentesis, with fluid removal occurring weekly, sometimes twice a week, with the last procedure performed on Monday. The patient has been experiencing worsening cough over the past few months, leading to episodes of coughing up blood, described as blood-tinged sputum following intense coughing fits. She denies fever but reports significant nausea. The patient recently fell, landing on her side, which may have exacerbated her symptoms. She has a history of chronic pancreatitis and is under the care of a liver transplant team, having recently been a backup candidate for a liver transplant. The history was obtained from the patient and her friend.        Prior to Admission medications   Medication Sig Start Date End Date Taking? Authorizing Provider  baclofen (LIORESAL) 10 MG tablet Take 10 mg by mouth once a week. After paracentesis 02/10/24 02/09/25  [provider]  clotrimazole -betamethasone  (LOTRISONE ) cream Apply 1 Application topically daily as needed (rash).    [provider]  Continuous Glucose Sensor (DEXCOM G7 SENSOR) MISC Use 1 sensor for continuous glucose monitoring every 10 days for 30 days 05/25/23   Braulio Hough, MD  ergocalciferol  (VITAMIN D2) 1.25 MG (50000 UT) capsule Take 50,000 Units by mouth once a week.    [provider]  FLUoxetine  (PROZAC )  40 MG capsule Take 1 capsule (40 mg total) by mouth 2 (two) times daily. 01/03/24   [name], MD  furosemide  (LASIX ) 20 MG tablet Take 1 tablet (20 mg total) by mouth daily. 02/22/24   Sherrill Cable Latif, DO  HYDROcodone -acetaminophen  (NORCO/VICODIN) 5-325 MG tablet Take 1 tablet by mouth every 6 (six) hours as needed for severe pain (pain score 7-10) or moderate pain (pain score 4-6). 02/21/24   Sherrill Cable Latif, DO  insulin  regular human CONCENTRATED (HUMULIN  R) 500 UNIT/ML injection Inject 60-65 Units into the skin See admin instructions. 65 in the morning, 60 in the afternoon and 60 at bedtime    [provider]  lactulose  (CHRONULAC ) 10 GM/15ML solution Take 30 mLs (20 g total) by mouth 2 (two) times daily. 02/21/24   Sherrill Cable Latif, DO  midodrine  (PROAMATINE ) 5 MG tablet Take 3 tablets (15 mg total) by mouth with breakfast, with lunch, and with evening meal. Patient taking differently: Take 10 mg by mouth 2 (two) times daily with a meal. 02/03/24   Samtani, Jai-Gurmukh, MD  montelukast  (SINGULAIR ) 10 MG tablet Take 1 tablet (10 mg total) by mouth at bedtime. 08/25/22   [name], MD  nitrofurantoin , macrocrystal-monohydrate, (MACROBID ) 100 MG capsule Take 1 capsule (100 mg total) by mouth 2 (two) times daily. 02/13/24   [name], MD  ondansetron  (ZOFRAN -ODT) 4 MG disintegrating tablet Take 1 tablet (4 mg total) by mouth every 8 (eight) hours as needed for nausea or vomiting. 01/06/24   Garrick Charleston, MD  pantoprazole  (PROTONIX ) 40 MG tablet Take 1 tablet (40 mg total)  by mouth 2 (two) times daily. 02/17/24 03/18/24  Leotis Bogus, MD  polyethylene glycol (MIRALAX  / GLYCOLAX ) 17 g packet Take 17 g by mouth daily as needed for mild constipation. 02/21/24   Sheikh, Alejandro Latif, DO  rOPINIRole  (REQUIP ) 1 MG tablet Take 1 tablet (1 mg total) by mouth at bedtime. 01/03/24   [name], MD  spironolactone  (ALDACTONE ) 50 MG tablet Take 1 tablet (50 mg total) by mouth  daily. 02/22/24   Sherrill Alejandro Latif, DO  temazepam  (RESTORIL ) 30 MG capsule Take 1 capsule (30 mg total) by mouth at bedtime as needed for sleep. 02/13/24   [name], MD  XIFAXAN  550 MG TABS tablet Take 550 mg by mouth 2 (two) times daily. 10/27/23   [provider]    Allergies: Vancomycin , Amoxicillin, Azithromycin , Dulaglutide , Empagliflozin -metformin  hcl er, Januvia  [sitagliptin ], Latex, Metformin , Morphine , Penicillins, and Vitamin k  and related    Review of Systems  Updated Vital Signs BP (!) 109/59   Pulse 98   Temp 98.2 F (36.8 C) (Oral)   Resp 20   Ht 5' 6 (1.676 m)   Wt 83.5 kg   LMP 11/13/2020 (Exact Date)   SpO2 96%   BMI 29.71 kg/m   Physical Exam Vitals and nursing note reviewed.  Constitutional:      Appearance: She is well-developed.  HENT:     Head: Normocephalic and atraumatic.  Eyes:     General: Scleral icterus present.  Cardiovascular:     Rate and Rhythm: Normal rate and regular rhythm.  Pulmonary:     Effort: No respiratory distress.     Breath sounds: No stridor. Rales present.     Comments: Tachypneic and relative hypoxia Abdominal:     General: There is distension.  Musculoskeletal:     Cervical back: Normal range of motion.  Skin:    Coloration: Skin is jaundiced.  Neurological:     Mental Status: She is alert.     (all labs ordered are listed, but only abnormal results are displayed) Labs Reviewed  COMPREHENSIVE METABOLIC PANEL WITH GFR - Abnormal; Notable for the following components:      Result Value   Sodium 126 (*)    Potassium 3.3 (*)    Chloride 94 (*)    Glucose, Bld 387 (*)    Calcium  8.1 (*)    Total Protein 6.3 (*)    Albumin  2.7 (*)    AST 47 (*)    Total Bilirubin 5.3 (*)    All other components within normal limits  CBC - Abnormal; Notable for the following components:   WBC 3.4 (*)    RBC 3.33 (*)    Hemoglobin 9.2 (*)    HCT 29.2 (*)    RDW 17.1 (*)    Platelets 78 (*)    All other  components within normal limits  PROTIME-INR - Abnormal; Notable for the following components:   Prothrombin Time 20.6 (*)    INR 1.7 (*)    All other components within normal limits  AMMONIA - Abnormal; Notable for the following components:   Ammonia 75 (*)    All other components within normal limits  LACTATE DEHYDROGENASE, PLEURAL OR PERITONEAL FLUID - Abnormal; Notable for the following components:   LD, Fluid <25 (*)    All other components within normal limits  CBG MONITORING, ED - Abnormal; Notable for the following components:   Glucose-Capillary 347 (*)    All other components within normal limits  BODY  FLUID CULTURE W GRAM STAIN  LIPASE, BLOOD  GLUCOSE, PLEURAL OR PERITONEAL FLUID  PROTEIN, PLEURAL OR PERITONEAL FLUID  ALBUMIN , PLEURAL OR PERITONEAL FLUID   URINALYSIS, ROUTINE W REFLEX MICROSCOPIC  SAMPLE TO BLOOD BANK    EKG: EKG Interpretation Date/Time:  Sunday February 26 2024 00:31:10 EST Ventricular Rate:  94 PR Interval:  158 QRS Duration:  172 QT Interval:  440 QTC Calculation: 550 R Axis:   21  Text Interpretation: Technically poor tracing SUGGEST REPEAT TRACING Confirmed by Jerral Meth [phone]) on 02/26/2024 12:35:54 AM  Radiology: [facility] Chest Portable 1 View Result Date: 02/26/2024 EXAM: 1 VIEW(S) XRAY OF THE CHEST 02/26/2024 02:36:00 AM COMPARISON: None available. CLINICAL HISTORY: eval for cough FINDINGS: LUNGS AND PLEURA: Pulmonary hypoinflation. Left basilar atelectasis. No pleural effusion. No pneumothorax. HEART AND MEDIASTINUM: No acute abnormality of the cardiac and mediastinal silhouettes. BONES AND SOFT TISSUES: No acute osseous abnormality. IMPRESSION: 1. Pulmonary hypoinflation and left basilar atelectasis. Electronically signed by: Dorethia Molt MD 02/26/2024 02:48 AM EST RP Workstation: [id number]     .Critical Care  Performed by: Lorette Mayo, MD Authorized by: Lorette Mayo, MD   Critical care provider statement:    Critical care  time (minutes):  30   Critical care was necessary to treat or prevent imminent or life-threatening deterioration of the following conditions:  Metabolic crisis   Critical care was time spent personally by me on the following activities:  Development of treatment plan with patient or surrogate, discussions with consultants, evaluation of patient's response to treatment, examination of patient, ordering and review of laboratory studies, ordering and review of radiographic studies, ordering and performing treatments and interventions, pulse oximetry, re-evaluation of patient's condition and review of old charts Paracentesis  Date/Time: 02/26/2024 5:41 AM  Performed by: Lorette Mayo, MD Authorized by: Lorette Mayo, MD   Consent:    Consent obtained:  Verbal   Consent given by:  Patient   Risks, benefits, and alternatives were discussed: yes     Risks discussed:  Bleeding, bowel perforation, infection and pain   Alternatives discussed:  No treatment, delayed treatment and alternative treatment Universal protocol:    Procedure explained and questions answered to patient or proxy's satisfaction: yes     Patient identity confirmed:  Verbally with patient and arm band Pre-procedure details:    Procedure purpose:  Therapeutic   Preparation: Patient was prepped and draped in usual sterile fashion   Anesthesia:    Anesthesia method:  Local infiltration   Local anesthetic:  Lidocaine  2% w/o epi Procedure details:    Needle gauge:  18   Ultrasound guidance: yes     Puncture site:  R lower quadrant   Fluid removed amount:  3.5L   Fluid appearance:  Clear and serous   Dressing: dermabond. Post-procedure details:    Procedure completion:  Tolerated well, no immediate complications Comments:     Albumin  during procedure    Medications Ordered in the ED  prochlorperazine  (COMPAZINE ) injection 5 mg (has no administration in time range)  cefTRIAXone  (ROCEPHIN ) 2 g in sodium chloride  0.9 % 100 mL  IVPB (0 g Intravenous Stopped 02/26/24 0835)  albumin  human 25 % solution 25 g (25 g Intravenous New Bag/Given 02/26/24 0753)  pantoprazole  (PROTONIX ) EC tablet 40 mg (has no administration in time range)  rOPINIRole  (REQUIP ) tablet 1 mg (has no administration in time range)  spironolactone  (ALDACTONE ) tablet 50 mg (has no administration in time range)  polyethylene glycol (MIRALAX  / GLYCOLAX ) packet 17 g (has  no administration in time range)  lactulose  (CHRONULAC ) 10 GM/15ML solution 20 g (has no administration in time range)  FLUoxetine  (PROZAC ) capsule 40 mg (has no administration in time range)  furosemide  (LASIX ) tablet 20 mg (has no administration in time range)  midodrine  (PROAMATINE ) tablet 15 mg (15 mg Oral Given 02/26/24 0645)  insulin  aspart (novoLOG ) injection 0-9 Units (7 Units Subcutaneous Given 02/26/24 0740)  insulin  aspart (novoLOG ) injection 0-5 Units (has no administration in time range)  potassium chloride  10 mEq in 100 mL IVPB (10 mEq Intravenous New Bag/Given 02/26/24 0807)  rifaximin  (XIFAXAN ) tablet 550 mg (has no administration in time range)  fentaNYL  (SUBLIMAZE ) injection 50 mcg (50 mcg Intravenous Given 02/26/24 0229)  LORazepam  (ATIVAN ) injection 1 mg (1 mg Intravenous Given 02/26/24 0230)  albumin  human 25 % solution 25 g (25 g Intravenous New Bag/Given 02/26/24 0440)  fentaNYL  (SUBLIMAZE ) injection 100 mcg (100 mcg Intravenous Given 02/26/24 0438)  lactulose  (CHRONULAC ) 10 GM/15ML solution 20 g (20 g Oral Given 02/26/24 0613)    Clinical Course as of 02/26/24 0850  Sun Feb 26, 2024  0226 Contacted [facility] for consultation [JM]  0305 Spoke with [facility] and doesn't think needs transfer there for admission. Ok with paracentesis, symptomatic treatment. Ok with admission here for symptomatic care as there is nothing different they can offer her there at this time. If her MS changes, worsening labs or other concerns, open to reaching out again for help/transfer/etc. [JM]     Clinical Course User Index [JM] Orva Gwaltney, Selinda, MD                                 Medical Decision Making Amount and/or Complexity of Data Reviewed Labs: ordered. Radiology: ordered.  Risk Prescription drug management. Decision regarding hospitalization.  The patient presented to the ED with complaints of abdominal pain, coughing, and hemoptysis. She reported a history of frequent paracentesis due to liver disease and recent hospital discharge after removal of eight liters of fluid. The patient was advised by her transplant team to seek care due to worsening symptoms. She also reported a fall last night, which may have contributed to her current discomfort. Labs from a previous visit showed an increase in bilirubin to 5.3 and a sodium level of 126, which is symptomatic but not critically low. The patient was given pain medication and plans were made to perform a paracentesis to alleviate symptoms and test for infection. A chest X-ray was ordered, and a call was made to the transplant team in [place] for further guidance.  Differential Diagnosis: Differential diagnosis includes but is not limited to: hepatic hydrothorax, spontaneous bacterial peritonitis, pulmonary embolism, pneumonia, and exacerbation of chronic liver disease.  Diagnostics Review  Laboratory Interpretation (Interpreted by me): Bilirubin increased to 5.3; sodium level at 126.  Imaging Interpretation (Interpreted by me): Pending chest X-ray results.  Specialist Consultations Obtained or Considered: Contacted transplant team in [place] for further management guidance.  Historians other than the patient: Alyse Moats provided additional context regarding the patient's symptoms and recent medical history.  External Records Reviewed: Reviewed recent labs from the 10th and 11th showing bilirubin and sodium levels.  Care significantly affected by the following chronic Conditions: Chronic liver disease with recurrent  ascites requiring frequent paracentesis.  Management  Medications: Fentanyl  administered for pain management. Ativan  for nausea as she has a prolonged QT. Oxygen for hypoxia and tachypnea.   Re-Evaluations/Course of Care: The patient was  monitored for symptom relief following pain management and paracentesis.  Patient was persistently sleepy, tachypneic and hypoxic on room air even after the paracentesis.  With her elevated ammonia, low sodium of her best admitted to the hospital.  Lactulose  ordered.  Paracentesis results pending at time of admission.   Final diagnoses:  Hyponatremia  Other ascites  Acute respiratory failure with hypoxia [facility])  Hyperammonemia    ED Discharge Orders     None          Noralee Dutko, Selinda, MD 02/26/24 [phone]

## 2024-02-26 NOTE — Progress Notes (Signed)
 [facility]                                                                               Laurie Sutton, is a 43 y.o. female, DOB - [birth year], FMW:[id number] Admit date - 02/26/2024    Outpatient Primary MD for the patient is Laurie Sutton LABOR, MD  LOS - 0  days    Brief summary    Laurie Sutton is a 43 y.o. 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  Associated with abdominal distention.  The patient was recently admitted from 11/9 through 02/21/2024 due to decompensated cirrhosis with ascites, portal hypertension, and esophageal varices.  She was discharged to [facility] in [place] for liver transplant.  This did not take place and she is still on the liver transplant list.    She returns today to [facility] ER with the above complaints.  Endorses some mild, pink tinged blood in her sputum from coughing so much.  Denies hematemesis . In the ER, the patient had therapeutic and diagnostic paracentesis done by EDP, Dr. Lorette, with 3 to 4 L of ascitic fluid removed.     Assessment & Plan    Assessment and Plan:    Intractable abdominal pain in the setting of large ascites Rule out SBP Paracentesis done in the ED about 4 L of acetic fluid removed. Started on IV ceftriaxone , anti emetics, and abdominal pain.  IR paracentesis ordered.   Decompensated cirrhosis with large ascites Follows with liver transplant team at [facility]. Will need close follow-up appointment Gi consulted.    Type 2 diabetes with hyperglycemia Last hemoglobin A1c 7.9 Presented with serum glucose  of 300 Start insulin  coverage.   QTc prolongation Admission twelve-lead EKG with QTc of 565 Avoid QTc prolonging agents Optimize magnesium  and potassium level Monitor on telemetry   Hypokalemia Serum potassium 3.3 Repleted intravenously Repeat BMP and follow magnesium  level.   Chronic hyponatremia Serum sodium 126 Fluid restriction IV albumin  Encourage oral protein calorie intake as tolerated.   Hyperammonemia Ammonia level 75 Report compliance with home lactulose  and rifaximin  with 2 bowel movements daily Resume home medications.   Generalized weakness PT OT evaluation Fall precautions   Coagulopathy INR 1.7 Continue to monitor   Pancytopenia  Hemoglobin 9.2 with MCV of 87, baseline Continue to monitor H&H   Chronic hypotension Resume home midodrine  Closely monitor vital signs   Chronic anxiety/depression Resume home Prozac    GERD Resume home PPI   Restless leg syndrome Resume home ropinirole          Estimated body mass index is 29.71 kg/m as calculated from the following:   Height as of this encounter: 5' 6 (1.676 m).   Weight as of this encounter: 83.5 kg.  Code Status: full code.  DVT Prophylaxis:  SCDs Start: 02/26/24 9376   Level of Care: Level of care: Telemetry Family Communication: none at  bedside.   Disposition Plan:     Remains inpatient appropriate:  pending clinical improvement.   Procedures:  US  paracentesis  Consultants:   gastroenterology  Antimicrobials:   Anti-infectives (From admission, onward)    Start     Dose/Rate Route Frequency Ordered Stop   02/26/24 1000  rifaximin  (XIFAXAN ) tablet 550 mg        550 mg Oral 2 times daily 02/26/24 0708     02/26/24 0645  cefTRIAXone  (ROCEPHIN ) 2 g in sodium chloride  0.9 % 100 mL IVPB        2 g 200 mL/hr over 30 Minutes Intravenous Daily 02/26/24 0625          Medications  Scheduled Meds:  FLUoxetine   40 mg Oral BID   insulin  aspart  0-5 Units Subcutaneous QHS    insulin  aspart  0-9 Units Subcutaneous TID WC   lactulose   20 g Oral TID   midodrine   15 mg Oral TID with meals   pantoprazole   40 mg Oral BID   rifaximin   550 mg Oral BID   rOPINIRole   1 mg Oral QHS   Continuous Infusions:  albumin  human 25 g (02/26/24 1221)   cefTRIAXone  (ROCEPHIN )  IV Stopped (02/26/24 0835)   PRN Meds:.dronabinol, fentaNYL  (SUBLIMAZE ) injection, LORazepam , polyethylene glycol, prochlorperazine     Subjective:   Laurie Sutton was seen and examined today.  Requesting for pain meds and nausea medications.   Objective:   Vitals:   02/26/24 0815 02/26/24 0830 02/26/24 1140 02/26/24 1141  BP: (!) 113/58 (!) 109/59 (!) 105/58   Pulse: 99 98 95   Resp: (!) 22 20 (!) 21   Temp:    98.4 F (36.9 C)  TempSrc:    Oral  SpO2: 96% 96% 96%   Weight:      Height:        Intake/Output Summary (Last 24 hours) at 02/26/2024 1432 Last data filed at 02/26/2024 1143 Gross per 24 hour  Intake 412.2 ml  Output --  Net 412.2 ml   Filed Weights   02/26/24 0019  Weight: 83.5 kg     Exam General exam: ill appearing lady not in distress.  Respiratory system: Clear to auscultation. Respiratory effort normal. Cardiovascular system: S1 & S2 heard, RRR.  Gastrointestinal system: Abdomen is firm, distended, tenderness present. Bs+ Central nervous system: lethargic, but able to answer all questions.  Extremities: trace edema.  Skin: No rashes,  Psychiatry: Mood & affect appropriate.    Data Reviewed:  I have personally reviewed following labs and imaging studies   CBC Lab Results  Component Value Date   WBC 3.4 (L) 02/26/2024   RBC 3.33 (L) 02/26/2024   HGB 9.2 (L) 02/26/2024   HCT 29.2 (L) 02/26/2024   MCV 87.7 02/26/2024   MCH 27.6 02/26/2024   PLT 78 (L) 02/26/2024   MCHC 31.5 02/26/2024   RDW 17.1 (H) 02/26/2024   LYMPHSABS 0.4 (L) 02/21/2024   MONOABS 0.4 02/21/2024   EOSABS 0.1 02/21/2024   BASOSABS 0.0 02/21/2024     Last metabolic panel Lab  Results  Component Value Date   NA 126 (L) 02/26/2024   K 3.3 (L) 02/26/2024   CL 94 (L) 02/26/2024   CO2 22 02/26/2024   BUN 9 02/26/2024   CREATININE 0.74 02/26/2024   GLUCOSE 387 (H) 02/26/2024   GFRNONAA >60 02/26/2024   GFRAA >60 08/09/2019   CALCIUM  8.1 (L) 02/26/2024   PHOS 4.2 02/21/2024   PROT 6.3 (L) 02/26/2024  ALBUMIN  2.7 (L) 02/26/2024   BILITOT 5.3 (H) 02/26/2024   ALKPHOS 108 02/26/2024   AST 47 (H) 02/26/2024   ALT 31 02/26/2024   ANIONGAP 10 02/26/2024    CBG (last 3)  Recent Labs    02/26/24 0728 02/26/24 1232  GLUCAP 347* 234*      Coagulation Profile: Recent Labs  Lab 02/20/24 0604 02/21/24 0419 02/26/24 0300  INR 1.8* 1.8* 1.7*     Radiology Studies: DG Chest Portable 1 View Result Date: 02/26/2024 EXAM: 1 VIEW(S) XRAY OF THE CHEST 02/26/2024 02:36:00 AM COMPARISON: None available. CLINICAL HISTORY: eval for cough FINDINGS: LUNGS AND PLEURA: Pulmonary hypoinflation. Left basilar atelectasis. No pleural effusion. No pneumothorax. HEART AND MEDIASTINUM: No acute abnormality of the cardiac and mediastinal silhouettes. BONES AND SOFT TISSUES: No acute osseous abnormality. IMPRESSION: 1. Pulmonary hypoinflation and left basilar atelectasis. Electronically signed by: Dorethia Molt MD 02/26/2024 02:48 AM EST RP Workstation: [id number]       Elgie Butter M.D. [facility] 02/26/2024, 2:32 PM  Available via Epic secure chat 7am-7pm After 7 pm, please refer to night coverage provider listed on amion.

## 2024-02-26 NOTE — H&P (Addendum)
 History and Physical  Laurie Sutton FMW:[id number] DOB: 05-17-1980 DOA: 02/26/2024  Referring physician: Dr. Lorette, EDP  PCP: [name], MD  Outpatient Specialists: GI, liver transplant team at [facility]. Patient coming from: Home.  Chief Complaint: Abdominal pain and distention.  HPI: Laurie Sutton is a 43 y.o. 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  Associated with abdominal distention.  The patient was recently admitted from 11/9 through 02/21/2024 due to decompensated cirrhosis with ascites, portal hypertension, and esophageal varices.  She was discharged to [facility] in [place] for liver transplant.  This did not take place and she is still on the liver transplant list.  She returns today to [facility] ER with the above complaints.  Endorses some mild, pink tinged blood in her sputum from coughing so much.  Denies hematemesis.  Has been feeling nauseated.  Denies any subjective fevers.  In the ER, the patient had therapeutic and diagnostic paracentesis done by EDP, Dr. Lorette, with 3 to 4 L of ascitic fluid removed.  She received IV albumin  as well as several rounds of IV opiate-based analgesics.  Admitted by [facility], hospitalist service.  Due to concern for possible SBP, Rocephin  was initiated until SBP is ruled out.  Additional doses of IV albumin  ordered.  ED Course: Temperature 97.9.  BP 120/55, pulse 104, respiration rate 18, O2 saturation 95% on room air.  Review of Systems: Review of systems as noted in the HPI. All other systems reviewed and are negative.   Past Medical  History:  Diagnosis Date   ADHD    Allergy    Anemia    IRON TRANSFUSION 07-2020 NONE SINCE   Anxiety    Back pain    COVID 08/12/2019   ALL SYMPTOMS REOLVED PER PT   Depression    Diabetic neuropathy (HCC) 11/11/2020   FEET   dm type 2    sees Dr. Odella Jacobson at [facility]   Fatty liver    GERD (gastroesophageal reflux disease)    History of kidney stones    Joint pain    Menorrhagia 11/11/2020   Migraines    Murmur, cardiac    FAINT NO CARDIOLOGIST   Other fatigue    Pneumonia 08/12/2019   COVID PNEUMONIA ALL SYMPTOMS RESOLVED PER PT   Sepsis (HCC) 10/04/2023   Shortness of breath on exertion    Past Surgical History:  Procedure Laterality Date   CESAREAN SECTION  12/13/2006   COLONOSCOPY WITH PROPOFOL  N/A 08/02/2023   Procedure: COLONOSCOPY WITH PROPOFOL ;  Surgeon: Legrand Victory LITTIE DOUGLAS, MD;  Location: [facility] ENDOSCOPY;  Service: Gastroenterology;  Laterality: N/A;   ESOPHAGOGASTRODUODENOSCOPY N/A 02/16/2024   Procedure: EGD (ESOPHAGOGASTRODUODENOSCOPY);  Surgeon: Albertus Gordy HERO, MD;  Location: [facility] ENDOSCOPY;  Service: Gastroenterology;  Laterality: N/A;   ESOPHAGOGASTRODUODENOSCOPY (EGD) WITH PROPOFOL  N/A 08/02/2023   Procedure: ESOPHAGOGASTRODUODENOSCOPY (EGD) WITH PROPOFOL ;  Surgeon: Legrand Victory LITTIE DOUGLAS, MD;  Location: [facility] ENDOSCOPY;  Service: Gastroenterology;  Laterality: N/A;   EXTRACORPOREAL SHOCK WAVE LITHOTRIPSY  2018   FLEXIBLE SIGMOIDOSCOPY N/A 11/20/2023   Procedure: KINGSTON SIDE;  Surgeon: Albertus Gordy HERO, MD;  Location: [facility] ENDOSCOPY;  Service: Gastroenterology;  Laterality: N/A;   FOOT SURGERY Right 10/02/2020  RIGHT FOOT HEEL AND TOES, SUR[facility] CENTER OFF [address]   IR PARACENTESIS  07/06/2023   IR PARACENTESIS  08/18/2023   IR PARACENTESIS  10/05/2023   IR PARACENTESIS  10/11/2023   IR PARACENTESIS  10/20/2023   IR PARACENTESIS  11/08/2023   IR PARACENTESIS  11/28/2023   IR PARACENTESIS  12/09/2023   IR PARACENTESIS  12/15/2023   IR PARACENTESIS   12/16/2023   IR PARACENTESIS  12/23/2023   IR PARACENTESIS  12/30/2023   IR PARACENTESIS  01/06/2024   IR PARACENTESIS  01/06/2024   IR PARACENTESIS  01/10/2024   IR PARACENTESIS  01/13/2024   IR PARACENTESIS  01/20/2024   IR PARACENTESIS  01/27/2024   IR PARACENTESIS  02/01/2024   IR PARACENTESIS  02/03/2024   IR PARACENTESIS  02/08/2024   IR PARACENTESIS  02/15/2024   IR PARACENTESIS  02/20/2024   IR TRANSCATHETER BX  07/26/2023   IR US  GUIDE VASC ACCESS RIGHT  07/26/2023   IR VENOGRAM HEPATIC W HEMODYNAMIC EVALUATION  07/26/2023   LAPAROSCOPIC VAGINAL HYSTERECTOMY WITH SALPINGECTOMY Bilateral 11/17/2020   Procedure: LAPAROSCOPIC ASSISTED VAGINAL HYSTERECTOMY WITH BILATERAL SALPINGECTOMY;  Surgeon: Dannielle Bouchard, DO;  Location: [facility];  Service: Gynecology;  Laterality: Bilateral;   POLYPECTOMY  08/02/2023   Procedure: POLYPECTOMY, INTESTINE;  Surgeon: Legrand Victory LITTIE DOUGLAS, MD;  Location: [facility] END[facility];  Service: Gastroenterology;;    Social History:  reports that she has never smoked. She has been exposed to tobacco smoke. She has never used smokeless tobacco. She reports that she does not drink alcohol and does not use drugs.   Allergies  Allergen Reactions   Vancomycin  Itching   Amoxicillin Hives and Itching   Azithromycin      DOES NOT WORK   Dulaglutide  Other (See Comments)    constipation and stomach issues  **Trulicity **    Empagliflozin -Metformin  Hcl Er Other (See Comments) and Swelling   Januvia  [Sitagliptin ] Other (See Comments)    HURTS STOAMCH   Latex     IRRITATES VAGINAL AREA   Metformin      Other Reaction(s): achy all over   Morphine  Itching    Severe    Penicillins Hives    Has patient had a PCN reaction causing immediate rash, facial/tongue/throat swelling, SOB or lightheadedness with hypotension: yes. Rash  Has patient had a PCN reaction causing severe rash involving mucus membranes or skin necrosis: Yes- rash and hives all over body  Has patient  had a PCN reaction that required hospitalization No Has patient had a PCN reaction occurring within the last 10 years: Yes  If all of the above answers are NO, then may proceed with Cephalosporin use.    Vitamin K  And Related Other (See Comments)    Electric shock     Family History  Problem Relation Age of Onset   Colon polyps Mother    Depression Mother    Anxiety disorder Mother    Colon polyps Father    Sleep apnea Father    Anxiety disorder Father    Depression Father    Diabetes Father    Bladder Cancer Father 61   Rheum arthritis Father    Migraines Maternal Aunt    Migraines Maternal Grandmother    Diabetes Maternal Grandfather    Healthy Daughter    Asthma Daughter    Anxiety disorder Daughter    Anxiety disorder Son    Asthma Son    Healthy Son    Cerebral aneurysm Cousin    Heart  disease Other    Depression Other    Anxiety disorder Other    Sleep apnea Other    Colon cancer Neg Hx    Esophageal cancer Neg Hx    Stomach cancer Neg Hx    Rectal cancer Neg Hx       Prior to Admission medications   Medication Sig Start Date End Date Taking? Authorizing Provider  baclofen (LIORESAL) 10 MG tablet Take 10 mg by mouth once a week. After paracentesis 02/10/24 02/09/25  [provider]  clotrimazole -betamethasone  (LOTRISONE ) cream Apply 1 Application topically daily as needed (rash).    [provider]  Continuous Glucose Sensor (DEXCOM G7 SENSOR) MISC Use 1 sensor for continuous glucose monitoring every 10 days for 30 days 05/25/23   Braulio Hough, MD  ergocalciferol  (VITAMIN D2) 1.25 MG (50000 UT) capsule Take 50,000 Units by mouth once a week.    [provider]  FLUoxetine  (PROZAC ) 40 MG capsule Take 1 capsule (40 mg total) by mouth 2 (two) times daily. 01/03/24   [name], MD  furosemide  (LASIX ) 20 MG tablet Take 1 tablet (20 mg total) by mouth daily. 02/22/24   Sherrill Cable Latif, DO  HYDROcodone -acetaminophen  (NORCO/VICODIN)  5-325 MG tablet Take 1 tablet by mouth every 6 (six) hours as needed for severe pain (pain score 7-10) or moderate pain (pain score 4-6). 02/21/24   Sherrill Cable Latif, DO  insulin  regular human CONCENTRATED (HUMULIN  R) 500 UNIT/ML injection Inject 60-65 Units into the skin See admin instructions. 65 in the morning, 60 in the afternoon and 60 at bedtime    [provider]  lactulose  (CHRONULAC ) 10 GM/15ML solution Take 30 mLs (20 g total) by mouth 2 (two) times daily. 02/21/24   Sherrill Cable Latif, DO  midodrine  (PROAMATINE ) 5 MG tablet Take 3 tablets (15 mg total) by mouth with breakfast, with lunch, and with evening meal. Patient taking differently: Take 10 mg by mouth 2 (two) times daily with a meal. 02/03/24   Samtani, Jai-Gurmukh, MD  montelukast  (SINGULAIR ) 10 MG tablet Take 1 tablet (10 mg total) by mouth at bedtime. 08/25/22   [name], MD  nitrofurantoin , macrocrystal-monohydrate, (MACROBID ) 100 MG capsule Take 1 capsule (100 mg total) by mouth 2 (two) times daily. 02/13/24   [name], MD  ondansetron  (ZOFRAN -ODT) 4 MG disintegrating tablet Take 1 tablet (4 mg total) by mouth every 8 (eight) hours as needed for nausea or vomiting. 01/06/24   Garrick Charleston, MD  pantoprazole  (PROTONIX ) 40 MG tablet Take 1 tablet (40 mg total) by mouth 2 (two) times daily. 02/17/24 03/18/24  Leotis Bogus, MD  polyethylene glycol (MIRALAX  / GLYCOLAX ) 17 g packet Take 17 g by mouth daily as needed for mild constipation. 02/21/24   Sherrill Cable Latif, DO  rOPINIRole  (REQUIP ) 1 MG tablet Take 1 tablet (1 mg total) by mouth at bedtime. 01/03/24   [name], MD  spironolactone  (ALDACTONE ) 50 MG tablet Take 1 tablet (50 mg total) by mouth daily. 02/22/24   Sherrill Cable Latif, DO  temazepam  (RESTORIL ) 30 MG capsule Take 1 capsule (30 mg total) by mouth at bedtime as needed for sleep. 02/13/24   [name], MD  XIFAXAN  550 MG TABS tablet Take 550 mg by mouth 2 (two) times daily. 10/27/23    [provider]    Physical Exam: BP (!) 117/58   Pulse (!) 102   Temp 97.9 F (36.6 C) (Oral)   Resp 19   Ht  5' 6 (1.676 m)   Wt 83.5 kg   LMP 11/13/2020 (Exact Date)   SpO2 91%   BMI 29.71 kg/m   General: 43 y.o. year-old female well developed well nourished in no acute distress.  Alert and oriented x3. Cardiovascular: Regular rate and rhythm with no rubs or gallops.  No thyromegaly or JVD noted.  No lower extremity edema. 2/4 pulses in all 4 extremities. Respiratory: Clear to auscultation with no wheezes or rales. Good inspiratory effort. Abdomen: Distended diffusely tender with normal bowel sounds x4 quadrants. Muskuloskeletal: No cyanosis, clubbing or edema noted bilaterally Neuro: CN II-XII intact, strength, sensation, reflexes Skin: No ulcerative lesions noted or rashes Psychiatry: Judgement and insight appear normal. Mood is appropriate for condition and setting          Labs on Admission:  Basic Metabolic Panel: Recent Labs  Lab 02/19/24 1304 02/20/24 0604 02/21/24 0419 02/26/24 0033  NA 130* 131* 134* 126*  K 4.1 3.8 3.5 3.3*  CL 97* 98 98 94*  CO2 20* 24 24 22   GLUCOSE 320* 221* 130* 387*  BUN 7 8 10 9   CREATININE 0.86 0.75 0.77 0.74  CALCIUM  8.4* 8.3* 8.2* 8.1*  MG  --   --  1.7  --   PHOS  --   --  4.2  --    Liver Function Tests: Recent Labs  Lab 02/19/24 1304 02/20/24 0604 02/21/24 0419 02/26/24 0033  AST 44* 38 43* 47*  ALT 25 23 23 31   ALKPHOS 88 90 79 108  BILITOT 5.0* 3.8* 3.5* 5.3*  PROT 6.4* 5.9* 5.9* 6.3*  ALBUMIN  3.0* 2.8* 2.7* 2.7*   Recent Labs  Lab 02/19/24 1304 02/26/24 0033  LIPASE 37 45   Recent Labs  Lab 02/19/24 1341 02/20/24 1123 02/21/24 0419 02/26/24 0309  AMMONIA 61* 34 43* 75*   CBC: Recent Labs  Lab 02/19/24 1304 02/20/24 0604 02/21/24 0419 02/26/24 0033  WBC 2.6* 2.5* 2.5* 3.4*  NEUTROABS 1.9  --  1.5*  --   HGB 8.7* 8.1* 8.6* 9.2*  HCT 28.0* 25.3* 27.0* 29.2*  MCV 89.7 87.5 88.5 87.7   PLT 65* 61* 63* 78*   Cardiac Enzymes: No results for input(s): CKTOTAL, CKMB, CKMBINDEX, TROPONINI in the last 168 hours.  BNP (last 3 results) Recent Labs    12/15/23 0354  BNP 15.8    ProBNP (last 3 results) No results for input(s): PROBNP in the last 8760 hours.  CBG: Recent Labs  Lab 02/20/24 2004 02/20/24 2209 02/21/24 0606 02/21/24 0821 02/21/24 1201  GLUCAP 202* 227* 132* 125* 184*    Radiological Exams on Admission: DG Chest Portable 1 View Result Date: 02/26/2024 EXAM: 1 VIEW(S) XRAY OF THE CHEST 02/26/2024 02:36:00 AM COMPARISON: None available. CLINICAL HISTORY: eval for cough FINDINGS: LUNGS AND PLEURA: Pulmonary hypoinflation. Left basilar atelectasis. No pleural effusion. No pneumothorax. HEART AND MEDIASTINUM: No acute abnormality of the cardiac and mediastinal silhouettes. BONES AND SOFT TISSUES: No acute osseous abnormality. IMPRESSION: 1. Pulmonary hypoinflation and left basilar atelectasis. Electronically signed by: Dorethia Molt MD 02/26/2024 02:48 AM EST RP Workstation: [id number]    EKG: I independently viewed the EKG done and my findings are as followed: Sinus tachycardia rate of 101.  QTc 565.  Assessment/Plan Present on Admission:  Intractable abdominal pain  Principal Problem:   Intractable abdominal pain  Intractable abdominal pain in the setting of large ascites, POA Rule out SBP Paracentesis done in the ER with 3 to 4 L of ascitic fluid removed  Follow abdominal fluid and analysis Rocephin  started until SBP is ruled out Continue pain control as needed Continue antiemetics as needed Continue IV albumin  25 g every 6 hours  Decompensated cirrhosis with large ascites INR 1.7, ammonia 75, AST 47, T. bili 5.3. Follows with liver transplant team at [facility]. Will need close follow-up appointment Resume home p.o. Lasix , spironolactone , lactulose  Therapeutic and diagnostic paracentesis done in the ER with 3 to 4 L of abdominal fluid  removed. Will need more paracentesis, IR consulted. [facility] also consulted  Type 2 diabetes with hyperglycemia Last hemoglobin A1c 7.9 Presented with serum glucose of 300 Start insulin  coverage.  QTc prolongation Admission twelve-lead EKG with QTc of 565 Avoid QTc prolonging agents Optimize magnesium  and potassium level Monitor on telemetry  Hypokalemia Serum potassium 3.3 Repleted intravenously Repeat BMP and follow magnesium  level.  Chronic hyponatremia Serum sodium 126 Fluid restriction IV albumin  Encourage oral protein calorie intake as tolerated.  Hyperammonemia Ammonia level 75 Report compliance with home lactulose  and rifaximin  with 2 bowel movements daily Resume home medications.  Generalized weakness PT OT evaluation Fall precautions  Coagulopathy INR 1.7 Continue to monitor  Chronic normocytic anemia Hemoglobin 9.2 with MCV of 87, baseline Continue to monitor H&H  Chronic hypotension Resume home midodrine  Closely monitor vital signs  Chronic anxiety/depression Resume home Prozac   GERD Resume home PPI  Restless leg syndrome Resume home ropinirole     Critical care time: 55 minutes.    DVT prophylaxis: SCDs  Code Status: Full code  Family Communication: None at bedside  Disposition Plan: Admitted to telemetry unit.  Consults called: IR and [facility].  Admission status: Inpatient status.   Status is: Inpatient The patient requires at least 2 midnights for further evaluation and treatment of present condition.   Terry LOISE Hurst MD [facility] Pager [phone]  If 7PM-7AM, please contact night-coverage [email or web address] Password [email or web address]  02/26/2024, 6:26 AM

## 2024-02-27 ENCOUNTER — Inpatient Hospital Stay (HOSPITAL_COMMUNITY)

## 2024-02-27 DIAGNOSIS — E7220 Disorder of urea cycle metabolism, unspecified: Secondary | ICD-10-CM

## 2024-02-27 DIAGNOSIS — K7581 Nonalcoholic steatohepatitis (NASH): Secondary | ICD-10-CM | POA: Diagnosis not present

## 2024-02-27 DIAGNOSIS — K7469 Other cirrhosis of liver: Secondary | ICD-10-CM | POA: Diagnosis not present

## 2024-02-27 DIAGNOSIS — E871 Hypo-osmolality and hyponatremia: Secondary | ICD-10-CM | POA: Diagnosis not present

## 2024-02-27 DIAGNOSIS — R188 Other ascites: Secondary | ICD-10-CM

## 2024-02-27 DIAGNOSIS — R109 Unspecified abdominal pain: Secondary | ICD-10-CM | POA: Diagnosis not present

## 2024-02-27 HISTORY — PX: IR PARACENTESIS: IMG2679

## 2024-02-27 LAB — CBC WITH DIFFERENTIAL/PLATELET
Abs Immature Granulocytes: 0.01 K/uL (ref 0.00–0.07)
Basophils Absolute: 0 K/uL (ref 0.0–0.1)
Basophils Relative: 1 %
Eosinophils Absolute: 0.1 K/uL (ref 0.0–0.5)
Eosinophils Relative: 5 %
HCT: 24.4 % — ABNORMAL LOW (ref 36.0–46.0)
Hemoglobin: 7.6 g/dL — ABNORMAL LOW (ref 12.0–15.0)
Immature Granulocytes: 1 %
Lymphocytes Relative: 18 %
Lymphs Abs: 0.4 K/uL — ABNORMAL LOW (ref 0.7–4.0)
MCH: 27.7 pg (ref 26.0–34.0)
MCHC: 31.1 g/dL (ref 30.0–36.0)
MCV: 89.1 fL (ref 80.0–100.0)
Monocytes Absolute: 0.3 K/uL (ref 0.1–1.0)
Monocytes Relative: 13 %
Neutro Abs: 1.3 K/uL — ABNORMAL LOW (ref 1.7–7.7)
Neutrophils Relative %: 62 %
Platelets: 59 K/uL — ABNORMAL LOW (ref 150–400)
RBC: 2.74 MIL/uL — ABNORMAL LOW (ref 3.87–5.11)
RDW: 17.2 % — ABNORMAL HIGH (ref 11.5–15.5)
WBC: 2.1 K/uL — ABNORMAL LOW (ref 4.0–10.5)
nRBC: 0 % (ref 0.0–0.2)

## 2024-02-27 LAB — BASIC METABOLIC PANEL WITH GFR
Anion gap: 10 (ref 5–15)
BUN: <5 mg/dL — ABNORMAL LOW (ref 6–20)
CO2: 22 mmol/L (ref 22–32)
Calcium: 8.1 mg/dL — ABNORMAL LOW (ref 8.9–10.3)
Chloride: 99 mmol/L (ref 98–111)
Creatinine, Ser: 0.67 mg/dL (ref 0.44–1.00)
GFR, Estimated: >60 mL/min (ref >60)
Glucose, Bld: 224 mg/dL — ABNORMAL HIGH (ref 70–99)
Potassium: 3.5 mmol/L (ref 3.5–5.1)
Sodium: 131 mmol/L — ABNORMAL LOW (ref 135–145)

## 2024-02-27 LAB — COMPREHENSIVE METABOLIC PANEL WITH GFR
ALT: 24 U/L (ref 0–44)
AST: 38 U/L (ref 15–41)
Albumin: 3.2 g/dL — ABNORMAL LOW (ref 3.5–5.0)
Alkaline Phosphatase: 68 U/L (ref 38–126)
Anion gap: 10 (ref 5–15)
BUN: 6 mg/dL (ref 6–20)
CO2: 25 mmol/L (ref 22–32)
Calcium: 8.3 mg/dL — ABNORMAL LOW (ref 8.9–10.3)
Chloride: 99 mmol/L (ref 98–111)
Creatinine, Ser: 0.65 mg/dL (ref 0.44–1.00)
GFR, Estimated: >60 mL/min (ref >60)
Glucose, Bld: 155 mg/dL — ABNORMAL HIGH (ref 70–99)
Potassium: 2.9 mmol/L — ABNORMAL LOW (ref 3.5–5.1)
Sodium: 134 mmol/L — ABNORMAL LOW (ref 135–145)
Total Bilirubin: 5.7 mg/dL — ABNORMAL HIGH (ref 0.0–1.2)
Total Protein: 6.1 g/dL — ABNORMAL LOW (ref 6.5–8.1)

## 2024-02-27 LAB — GLUCOSE, CAPILLARY
Glucose-Capillary: 226 mg/dL — ABNORMAL HIGH (ref 70–99)
Glucose-Capillary: 296 mg/dL — ABNORMAL HIGH (ref 70–99)

## 2024-02-27 LAB — MAGNESIUM: Magnesium: 1.6 mg/dL — ABNORMAL LOW (ref 1.7–2.4)

## 2024-02-27 LAB — CBG MONITORING, ED
Glucose-Capillary: 145 mg/dL — ABNORMAL HIGH (ref 70–99)
Glucose-Capillary: 237 mg/dL — ABNORMAL HIGH (ref 70–99)

## 2024-02-27 LAB — PHOSPHORUS: Phosphorus: 3.2 mg/dL (ref 2.5–4.6)

## 2024-02-27 MED ORDER — INSULIN ASPART 100 UNIT/ML IJ SOLN
2.0000 [IU] | Freq: Three times a day (TID) | INTRAMUSCULAR | Status: DC
  Administered 2024-02-27 – 2024-02-28 (×3): 2 [IU] via SUBCUTANEOUS
  Filled 2024-02-27 (×2): qty 2

## 2024-02-27 MED ORDER — POTASSIUM CHLORIDE CRYS ER 20 MEQ PO TBCR: 40.0000 meq | EXTENDED_RELEASE_TABLET | Freq: Two times a day (BID) | ORAL | Status: DC

## 2024-02-27 MED ORDER — POTASSIUM CHLORIDE CRYS ER 20 MEQ PO TBCR
40.0000 meq | EXTENDED_RELEASE_TABLET | Freq: Three times a day (TID) | ORAL | Status: AC
  Administered 2024-02-27 (×3): 40 meq via ORAL
  Filled 2024-02-27 (×3): qty 2

## 2024-02-27 MED ORDER — LACTULOSE 10 GM/15ML PO SOLN
30.0000 g | Freq: Three times a day (TID) | ORAL | Status: DC
  Administered 2024-02-27 – 2024-02-29 (×6): 30 g via ORAL
  Filled 2024-02-27 (×6): qty 45

## 2024-02-27 MED ORDER — TEMAZEPAM 15 MG PO CAPS
30.0000 mg | ORAL_CAPSULE | Freq: Every evening | ORAL | Status: DC | PRN
  Administered 2024-02-27 – 2024-02-28 (×2): 30 mg via ORAL
  Filled 2024-02-27 (×2): qty 2

## 2024-02-27 MED ORDER — LIDOCAINE-EPINEPHRINE (PF) 1 %-1:200000 IJ SOLN
10.0000 mL | Freq: Once | INTRAMUSCULAR | Status: AC
  Administered 2024-02-27: 10 mL
  Filled 2024-02-27: qty 10

## 2024-02-27 MED ORDER — MAGNESIUM SULFATE 4 GM/100ML IV SOLN
4.0000 g | Freq: Once | INTRAVENOUS | Status: AC
  Administered 2024-02-27: 4 g via INTRAVENOUS
  Filled 2024-02-27: qty 100

## 2024-02-27 MED ORDER — LIDOCAINE HCL 1 % IJ SOLN
INTRAMUSCULAR | Status: AC
  Filled 2024-02-27: qty 20

## 2024-02-27 NOTE — Anesthesia Postprocedure Evaluation (Signed)
 Anesthesia Post Note  Patient: Laurie Sutton  Procedure(s) Performed: EGD (ESOPHAGOGASTRODUODENOSCOPY)     Anesthesia Type: MAC Anesthetic complications: no   No notable events documented.  Last Vitals:  Vitals:   02/17/24 0325 02/17/24 0815  BP: 127/70 (!) 103/52  Pulse:  88  Resp: 15 14  Temp: 36.8 C 36.8 C  SpO2: 98%     Last Pain:  Vitals:   02/17/24 0915  TempSrc:   PainSc: 4                  Debby FORBES Like

## 2024-02-27 NOTE — Progress Notes (Signed)
 [facility]                                                                               Laurie Sutton, is a 43 y.o. female, DOB - 1981-01-17, FMW:[id number] Admit date - 02/26/2024    Outpatient Primary MD for the patient is Laurie Garnette LABOR, MD  LOS - 1  days    Brief summary    Laurie Sutton is a 43 y.o. 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  Associated with abdominal distention.  The patient was recently admitted from 11/9 through 02/21/2024 due to decompensated cirrhosis with ascites, portal hypertension, and esophageal varices.  She was discharged to [facility] in [place] for liver transplant.  This did not take place and she is still on the liver transplant list.    She returns to [facility] ER with the above complaints.  Endorses some mild, pink tinged blood in her sputum from coughing so much.  Denies hematemesis . In the ER, the patient had therapeutic and diagnostic paracentesis done by EDP, Dr. Lorette, with 3 to 4 L of ascitic fluid removed.  She underwent another paracentesis on 11/17 and 4 L of fluid removed. For the cultures from ascites fluid have been negative. She also completed 4 doses of IV albumin  in the last 24 hours GI consulted and are on board    Assessment & Plan    Assessment and Plan:    Intractable abdominal pain in the setting of large ascites Rule out SBP, ascites fluid cultures negative so far continue to monitor Paracentesis done twice in the last 24 hours and a total of 8 L of ascitic fluid removed.  Started on IV ceftriaxone ,  anti emetics, and abdominal pain.    Decompensated cirrhosis with large ascites Follows with liver transplant team at [facility]. Will need close follow-up appointment Gi consulted. She will probably need twice weekly paracentesis moving forward. Patient is on lactulose  20 mg 3 times daily has not had a bowel movement yet will increase it to 30 mg 3 times daily. Continue with rifaximin . Holding all diuretics for now for borderline blood pressure parameters     Type 2 diabetes with hyperglycemia Last hemoglobin A1c 7.9 CBG (last 3)  Recent Labs    02/26/24 2111 02/27/24 0757 02/27/24 1248  GLUCAP 205* 145* 237*   Continue with sliding scale insulin  and add 2 units of NovoLog  3 times daily AC.     QTc prolongation Admission twelve-lead EKG with QTc of 565 Avoid QTc prolonging agents Optimize magnesium  and potassium level.  Keep magnesium  greater than 2 and potassium greater than 4 Monitor on telemetry   Hypokalemia Hypomagnesemia Replace and repeat later today   Chronic hyponatremia Probably from fluid overload from decompensated cirrhosis Sodium has slowly improved to 134 Fluid restriction IV albumin  Encourage oral protein calorie intake as tolerated.   Hyperammonemia Ammonia level  75 Report compliance with home lactulose  and rifaximin  with 2 bowel movements daily.  No bowel movement in the last 24 hours.  Increase lactulose  to 30 mg 3 times daily. Resume home medications.   Generalized weakness PT OT evaluation Fall precautions   Coagulopathy INR 1.7 Continue to monitor   Pancytopenia  Hemoglobin has dropped to 7.6 from 9.2 WBC count is 2.1 and platelets have dropped from 78-59. No obvious signs of bleeding at this time   Chronic hypotension Resume home midodrine  Closely monitor vital signs   Chronic anxiety/depression Resume home Prozac    GERD Resume home PPI   Restless leg syndrome Resume home ropinirole       Estimated body mass index is  29.71 kg/m as calculated from the following:   Height as of this encounter: 5' 6 (1.676 m).   Weight as of this encounter: 83.5 kg.  Code Status: full code.  DVT Prophylaxis:  SCDs Start: 02/26/24 9376   Level of Care: Level of care: Telemetry Family Communication: none at bedside.   Disposition Plan:     Remains inpatient appropriate:  pending clinical improvement.   Procedures:  US  paracentesis x 2  Consultants:   gastroenterology  Antimicrobials:   Anti-infectives (From admission, onward)    Start     Dose/Rate Route Frequency Ordered Stop   02/26/24 1000  rifaximin  (XIFAXAN ) tablet 550 mg        550 mg Oral 2 times daily 02/26/24 0708     02/26/24 0645  cefTRIAXone  (ROCEPHIN ) 2 g in sodium chloride  0.9 % 100 mL IVPB        2 g 200 mL/hr over 30 Minutes Intravenous Daily 02/26/24 0625          Medications  Scheduled Meds:  FLUoxetine   40 mg Oral BID   insulin  aspart  0-5 Units Subcutaneous QHS   insulin  aspart  0-9 Units Subcutaneous TID WC   lactulose   30 g Oral TID   midodrine   15 mg Oral TID with meals   pantoprazole   40 mg Oral BID   potassium chloride   40 mEq Oral TID   rifaximin   550 mg Oral BID   rOPINIRole   1 mg Oral QHS   Continuous Infusions:  cefTRIAXone  (ROCEPHIN )  IV 2 g (02/27/24 1112)   PRN Meds:.dronabinol, fentaNYL  (SUBLIMAZE ) injection, LORazepam , polyethylene glycol, prochlorperazine     Subjective:   Laurie Sutton was seen and examined today.  Requesting to advance diet, abdominal pain control with fentanyl   Objective:   Vitals:   02/27/24 0600 02/27/24 0720 02/27/24 1040 02/27/24 1135  BP: (!) 92/49  116/60   Pulse: 94  85   Resp: (!) 24  (!) 22   Temp:  98.9 F (37.2 C)  98.3 F (36.8 C)  TempSrc:  Oral  Oral  SpO2: 91%  97%   Weight:      Height:        Intake/Output Summary (Last 24 hours) at 02/27/2024 1328 Last data filed at 02/26/2024 1434 Gross per 24 hour  Intake 78.18 ml  Output --  Net 78.18 ml    Filed Weights   02/26/24 0019  Weight: 83.5 kg     Exam  General exam: Ill-appearing woman, not in any kind of distress Respiratory system: Diminished air entry at bases, no wheezing heard Cardiovascular system: S1 & S2 heard, RRR.  Gastrointestinal system: Abdomen is soft, distended Central nervous system: Alert and oriented.  Extremities: Trace edema Skin: No rashes,  Psychiatry: Mood & affect  appropriate.  .    Data Reviewed:  I have personally reviewed following labs and imaging studies   CBC Lab Results  Component Value Date   WBC 2.1 (L) 02/27/2024   RBC 2.74 (L) 02/27/2024   HGB 7.6 (L) 02/27/2024   HCT 24.4 (L) 02/27/2024   MCV 89.1 02/27/2024   MCH 27.7 02/27/2024   PLT 59 (L) 02/27/2024   MCHC 31.1 02/27/2024   RDW 17.2 (H) 02/27/2024   LYMPHSABS 0.4 (L) 02/27/2024   MONOABS 0.3 02/27/2024   EOSABS 0.1 02/27/2024   BASOSABS 0.0 02/27/2024     Last metabolic panel Lab Results  Component Value Date   NA 134 (L) 02/27/2024   K 2.9 (L) 02/27/2024   CL 99 02/27/2024   CO2 25 02/27/2024   BUN 6 02/27/2024   CREATININE 0.65 02/27/2024   GLUCOSE 155 (H) 02/27/2024   GFRNONAA >60 02/27/2024   GFRAA >60 08/09/2019   CALCIUM  8.3 (L) 02/27/2024   PHOS 3.2 02/27/2024   PROT 6.1 (L) 02/27/2024   ALBUMIN  3.2 (L) 02/27/2024   BILITOT 5.7 (H) 02/27/2024   ALKPHOS 68 02/27/2024   AST 38 02/27/2024   ALT 24 02/27/2024   ANIONGAP 10 02/27/2024    CBG (last 3)  Recent Labs    02/26/24 2111 02/27/24 0757 02/27/24 1248  GLUCAP 205* 145* 237*      Coagulation Profile: Recent Labs  Lab 02/21/24 0419 02/26/24 0300  INR 1.8* 1.7*     Radiology Studies: DG Chest Portable 1 View Result Date: 02/26/2024 EXAM: 1 VIEW(S) XRAY OF THE CHEST 02/26/2024 02:36:00 AM COMPARISON: None available. CLINICAL HISTORY: eval for cough FINDINGS: LUNGS AND PLEURA: Pulmonary hypoinflation. Left basilar atelectasis. No pleural effusion. No pneumothorax. HEART AND  MEDIASTINUM: No acute abnormality of the cardiac and mediastinal silhouettes. BONES AND SOFT TISSUES: No acute osseous abnormality. IMPRESSION: 1. Pulmonary hypoinflation and left basilar atelectasis. Electronically signed by: Dorethia Molt MD 02/26/2024 02:48 AM EST RP Workstation: [id number]       Elgie Butter M.D. [facility] 02/27/2024, 1:28 PM  Available via Epic secure chat 7am-7pm After 7 pm, please refer to night coverage provider listed on amion.

## 2024-02-27 NOTE — Progress Notes (Signed)
 OT Cancellation Note  Patient Details Name: Laurie Sutton MRN: [id number] DOB: [birth year]   Cancelled Treatment:    Reason Eval/Treat Not Completed: Other (comment). Pt in process of transferring from ER to her room up on the floor.  Donny BECKER OT Acute Rehabilitation Services Office [phone]    [name] 02/27/2024, 1:42 PM

## 2024-02-27 NOTE — Plan of Care (Signed)

## 2024-02-27 NOTE — Procedures (Signed)
 PROCEDURE SUMMARY:  Successful ultrasound guided paracentesis from the left  lower quadrant.  Yielded 4 liters of straw colored fluid.  No immediate complications.  The patient tolerated the procedure well.     EBL < 2 mL  Patient currently bring worked up for transplant at [facility].

## 2024-02-27 NOTE — ED Notes (Signed)
 PT to IR

## 2024-02-27 NOTE — ED Notes (Signed)
 Dr. Cherlyn notified pt usually has albumin  after a paracentesis.

## 2024-02-27 NOTE — Progress Notes (Signed)
 PT Cancellation Note  Patient Details Name: Laurie Sutton MRN: [id number] DOB: 1980-12-29   Cancelled Treatment:    Reason Eval/Treat Not Completed: Patient at procedure or test/unavailable (Pt in IR for paracentesis. Will return as able.)   Stephane JULIANNA Bevel 02/27/2024, 1:12 PM Radford Pease M,PT Acute Rehab Services [phone]

## 2024-02-27 NOTE — Progress Notes (Addendum)
 Patient ID: Laurie Sutton, female   DOB: [birth year], 43 y.o.   MRN: [id number]    Progress Note   Subjective  Day 1 CC; severely decompensated Laurie Sutton cirrhosis with refractory ascites, chronic encephalopathy  Came back to the ER again yesterday due to abdominal pain which she says was primarily in the right mid quadrant and stabbing in nature and in addition to this has had significant reaccumulation of ascites, no fever, no nausea or vomiting.   MELD 3.0: 22 at 02/27/2024  4:38 AM MELD-Na: 21 at 02/27/2024  4:38 AM Calculated from: Serum Creatinine: 0.65 mg/dL (Using min of 1 mg/dL) at 88/82/7974  5:61 AM Serum Sodium: 134 mmol/L at 02/27/2024  4:38 AM Total Bilirubin: 5.7 mg/dL at 88/82/7974  5:61 AM Serum Albumin : 3.2 g/dL at 88/82/7974  5:61 AM INR(ratio): 1.7 at 02/26/2024  3:00 AM Age at listing (hypothetical): 43 years Sex: Female at 02/27/2024  4:38 AM    Paracentesis pending this morning  Status post large-volume paracentesis 11/10-8 L removed   Labs today-WBC 2.1/hemoglobin 7.6/hematocrit 24.1/platelets 59 Sodium 134/potassium 2.9/BUN 6/creatinine 0.65    Objective   Vital signs in last 24 hours: Temp:  [98.4 F (36.9 C)-99.9 F (37.7 C)] 98.9 F (37.2 C) (11/17 0720) Pulse Rate:  [86-103] 94 (11/17 0600) Resp:  [18-29] 24 (11/17 0600) BP: (92-117)/(46-68) 92/49 (11/17 0600) SpO2:  [91 %-99 %] 91 % (11/17 0600)   General:    white female in NAD chronically ill-appearing, pleasant Heart:  Regular rate and rhythm; no murmurs Lungs: Respirations even and unlabored, lungs CTA bilaterally Abdomen: Protuberant tight ascites, mild generalized tenderness, seems more tender in the right abdomen, no rebound, no obvious ecchymoses Extremities:  Without edema. Neurologic:  Alert and oriented,  grossly normal neurologically.  Mild asterixis, mentation a little slow but appropriate Psych:  Cooperative. Normal mood and affect.  Intake/Output from previous day: 11/16  0701 - 11/17 0700 In: 490.4 [IV Piggyback:490.4] Out: -  Intake/Output this shift: No intake/output data recorded.  Lab Results: Recent Labs    02/26/24 0033 02/27/24 0438  WBC 3.4* 2.1*  HGB 9.2* 7.6*  HCT 29.2* 24.4*  PLT 78* 59*   BMET Recent Labs    02/26/24 0033 02/27/24 0438  NA 126* 134*  K 3.3* 2.9*  CL 94* 99  CO2 22 25  GLUCOSE 387* 155*  BUN 9 6  CREATININE 0.74 0.65  CALCIUM  8.1* 8.3*   LFT Recent Labs    02/27/24 0438  PROT 6.1*  ALBUMIN  3.2*  AST 38  ALT 24  ALKPHOS 68  BILITOT 5.7*   PT/INR Recent Labs    02/26/24 0300  LABPROT 20.6*  INR 1.7*    Studies/Results: DG Chest Portable 1 View Result Date: 02/26/2024 EXAM: 1 VIEW(S) XRAY OF THE CHEST 02/26/2024 02:36:00 AM COMPARISON: None available. CLINICAL HISTORY: eval for cough FINDINGS: LUNGS AND PLEURA: Pulmonary hypoinflation. Left basilar atelectasis. No pleural effusion. No pneumothorax. HEART AND MEDIASTINUM: No acute abnormality of the cardiac and mediastinal silhouettes. BONES AND SOFT TISSUES: No acute osseous abnormality. IMPRESSION: 1. Pulmonary hypoinflation and left basilar atelectasis. Electronically signed by: Dorethia Molt MD 02/26/2024 02:48 AM EST RP Workstation: [id number]       Assessment / Plan:    #85 43 year old white female with severely decompensated MASH cirrhosis with portal gastropathy, esophageal varices, chronic hepatic encephalopathy and refractory ascites now requiring paracentesis at least once per week if not more.  Patient was just discharged from the hospital on 02/21/2024 after presenting  with abdominal pain and tight ascites. Patient comes back to the emergency room yesterday with very similar symptoms, ascites has all reaccumulated, very tight and was having stabbing pain in the right mid abdomen.  No fever, no nausea or vomiting.  Suspect pain secondary to large volume ascites, doubt SBP but will need to rule out On ceftriaxone   Scheduled for  large-volume paracentesis this a.m.  Current MELD-Na= 22  Patient is on the active transplant list through [facility] and actually had been called last week about a potential liver which ultimately went to another patient.  She is high on the transplant list at present, and awaiting call for transplant.  #2 prolonged QT #3 restless leg syndrome #4.  GERD on twice daily PPI  Plan; large-volume paracentesis today with IV albumin  Need cell counts Continue potassium replacement Will reassess regarding her abdominal pain post paracentesis Repeat labs in a.m. note hemoglobin drifting Patient voices some frustration with having to come back and forth to the hospital so frequently and says she would rather stay if it is possible that she will have to come back in a few days for another paracentesis.    Principal Problem:   Intractable abdominal pain     LOS: 1 day   Amy Esterwood PA-C 02/27/2024, 8:52 AM    Attending physician's note   I have taken a history, reviewed the chart, and examined the patient. I performed a substantive portion of this encounter, including complete performance of at least one of the key components, in conjunction with the APP. I agree with the APP's note, impression, and recommendations with my edits.   43 year old female with Laurie Sutton cirrhosis with refractory ascites well-known to the GI service readmitted with reaccumulation of ascites requiring repeat paracentesis today.  Paracentesis completed with 4 L drawn off.  Feeling better afterwards.  - Will send ascites fluid for cell count, culture, Gram stain - IV albumin  - May plan to keep her for a couple of days so that she can get a repeat paracentesis again.  Will revisit that conversation with her again tomorrow to see how she is feeling - Patient listed for transplant through [facility] - GI service will continue to follow  A total of 55 minutes of time was spent on this encounter, including  in depth chart review, independent review of results as outlined above, communicating results with the patient directly, face-to-face time with the patient, coordinating care, and ordering studies and medications as appropriate, and documentation.    [address], DO, FACG [phone] office

## 2024-02-27 NOTE — Evaluation (Signed)
 Occupational Therapy Evaluation Patient Details Name: Laurie Sutton MRN: [id number] DOB: 09/10/1980 Today's Date: 02/27/2024   History of Present Illness   Laurie Sutton is a 43 y.o. female who presents to the ER from home due to severe diffuse abdominal pain. Found to have large ascites s/p paracentesis (3-4 Liters). 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 She is indep at baseline and reports managing well since discharge home [facility]. Upon evaluation the pt was limited by lethargy. Overall she needed constant stimulation for arousal however completed bed mobility without physical assist, ambulation deferred. Due to the deficits listed below the pt needed assist for ADLs on evaluation, however anticipate she could complete with mod I if her arousal was better. Pt will benefit from continued acute OT services to fully assess ADLs and mobility and to discharge without OT follow up.      If plan is discharge home, recommend the following:   Assist for transportation;Assistance with cooking/housework     Functional Status Assessment   Patient has had a recent decline in their functional status and demonstrates the ability to make significant improvements in function in a reasonable and predictable amount of time.     Equipment Recommendations   None recommended by OT      Precautions/Restrictions   Precautions Precautions: Fall Restrictions Weight Bearing Restrictions Per Provider Order: No     Mobility Bed Mobility Overal bed mobility: Modified Independent             General bed mobility  comments: cues and stimulation needed for arousal    Transfers Overall transfer level: Needs assistance                 General transfer comment: deferred fur to pt frequently falling asleep      Balance Overall balance assessment: Needs assistance Sitting-balance support: Feet supported Sitting balance-Leahy Scale: Fair         ADL either performed or assessed with clinical judgement   ADL Overall ADL's : Needs assistance/impaired           General ADL Comments: anticipate pt is mod I for ADLs however she was too lethargic on evalution to complete. generalized supervision with a lot of cues needed for arousal     Vision Baseline Vision/History: 0 No visual deficits Vision Assessment?: No apparent visual deficits     Perception Perception: Not tested       Praxis Praxis: Not tested       Pertinent Vitals/Pain Pain Assessment Pain Assessment: Faces Faces Pain Scale: Hurts a little bit Pain Location: abdomen     Extremity/Trunk Assessment Upper Extremity Assessment Upper Extremity Assessment: Overall WFL for tasks assessed   Lower Extremity Assessment Lower Extremity Assessment: Defer to PT evaluation   Cervical / Trunk Assessment Cervical / Trunk Assessment: Normal   Communication Communication Communication: No apparent difficulties   Cognition Arousal: Lethargic Behavior During Therapy: WFL for tasks assessed/performed Cognition: No apparent impairments             OT - Cognition Comments: seemingly WFL, pt falling asleep throughout the evaluation                 Following commands: Impaired Following commands  impaired: Follows one step commands inconsistently (due to arousal)     Cueing  General Comments   Cueing Techniques: Verbal cues  VSS on RA           Home Living Family/patient expects to be discharged to:: Private residence Living Arrangements: Spouse/significant other;Children;Parent Available Help at  Discharge: Family;Available 24 hours/day Type of Home: House Home Access: Stairs to enter [employer] of Steps: 1   Home Layout: Two level Alternate Level Stairs-Number of Steps: 6+6 Alternate Level Stairs-Rails: Right;Left Bathroom Shower/Tub: Producer, Television/film/video: Standard     Home Equipment: Agricultural Consultant (2 wheels);Shower seat;Wheelchair - manual          Prior Functioning/Environment Prior Level of Function : Independent/Modified Independent             Mobility Comments: no DME ADLs Comments: indep, has not driven or worked since symptoms started    OT Problem List: Decreased activity tolerance   OT Treatment/Interventions: Self-care/ADL training;Therapeutic exercise;DME and/or AE instruction;Therapeutic activities;Balance training;Patient/family education      OT Goals(Current goals can be found in the care plan section)   Acute Rehab OT Goals Patient Stated Goal: to sleep OT Goal Formulation: With patient Time For Goal Achievement: 03/12/24 Potential to Achieve Goals: Good ADL Goals Additional ADL Goal #1: pt will complete BADLs with mod I   OT Frequency:  Min 1X/week    Co-evaluation              AM-PAC OT 6 Clicks Daily Activity     Outcome Measure Help from another person eating meals?: None Help from another person taking care of personal grooming?: None Help from another person toileting, which includes using toliet, bedpan, or urinal?: A Little Help from another person bathing (including washing, rinsing, drying)?: A Little Help from another person to put on and taking off regular upper body clothing?: A Little Help from another person to put on and taking off regular lower body clothing?: A Little 6 Click Score: 20   End of Session Nurse Communication: Mobility status  Activity Tolerance: Patient tolerated treatment well Patient left: in bed;with call bell/phone within reach  OT Visit Diagnosis: Muscle  weakness (generalized) (M62.81)                Time: 8477-8461 OT Time Calculation (min): 16 min Charges:  OT General Charges $OT Visit: 1 Visit OT Evaluation $OT Eval Low Complexity: 1 Low  Lucie Kendall, OTR/L Acute Rehabilitation Services Office [phone] Secure Chat Communication Preferred   Lucie JONETTA Kendall 02/27/2024, 3:48 PM

## 2024-02-27 NOTE — Inpatient Diabetes Management (Signed)
 Inpatient Diabetes Program Recommendations  AACE/ADA: New Consensus Statement on Inpatient Glycemic Control   Target Ranges:  Prepandial:   less than 140 mg/dL      Peak postprandial:   less than 180 mg/dL (1-2 hours)      Critically ill patients:  140 - 180 mg/dL    Latest Reference Range & Units 02/26/24 07:28 02/26/24 12:32 02/26/24 17:22 02/26/24 21:11 02/27/24 07:57  Glucose-Capillary 70 - 99 mg/dL 652 (H) 765 (H) 763 (H) 205 (H) 145 (H)    Review of Glycemic Control  Diabetes history: DM2 Outpatient Diabetes medications: Humulin  R U500 65 units QAM, 60 units with lunch, 60 units with supper  Current orders for Inpatient glycemic control: Novolog  0-9 units TID with meals, Novolog  0-5 units QHS  Inpatient Diabetes Program Recommendations:    Insulin : If post prandial glucose remains consistently over 180 mg/dl, please consider ordering Novolog  4 units TID with meals for meal coverage if patient eats at least 50% of meals.  NOTE: Patient admitted 02/26/24 with abdominal pain and distention from decompensated cirrhosis. Patient was recently inpatient 02/19/24-02/21/24 and inpatient diabetes coordinator spoke with patient on 02/20/24.   Thanks, Earnie Gainer, RN, MSN, CDCES Diabetes Coordinator Inpatient Diabetes Program [phone] (Team Pager from 8am to 5pm)

## 2024-02-28 ENCOUNTER — Telehealth: Payer: Self-pay

## 2024-02-28 ENCOUNTER — Ambulatory Visit: Payer: BC Managed Care – PPO | Admitting: Neurology

## 2024-02-28 DIAGNOSIS — K7469 Other cirrhosis of liver: Secondary | ICD-10-CM | POA: Diagnosis not present

## 2024-02-28 DIAGNOSIS — E871 Hypo-osmolality and hyponatremia: Secondary | ICD-10-CM | POA: Diagnosis not present

## 2024-02-28 DIAGNOSIS — R109 Unspecified abdominal pain: Secondary | ICD-10-CM | POA: Diagnosis not present

## 2024-02-28 DIAGNOSIS — E7220 Disorder of urea cycle metabolism, unspecified: Secondary | ICD-10-CM | POA: Diagnosis not present

## 2024-02-28 DIAGNOSIS — K7581 Nonalcoholic steatohepatitis (NASH): Secondary | ICD-10-CM

## 2024-02-28 DIAGNOSIS — R188 Other ascites: Secondary | ICD-10-CM | POA: Diagnosis not present

## 2024-02-28 LAB — BODY FLUID CELL COUNT WITH DIFFERENTIAL
Eos, Fluid: 0 %
Lymphs, Fluid: 88 %
Monocyte-Macrophage-Serous Fluid: 10 % — ABNORMAL LOW (ref 50–90)
Neutrophil Count, Fluid: 2 % (ref 0–25)
Total Nucleated Cell Count, Fluid: 39 uL (ref 0–1000)

## 2024-02-28 LAB — CBC WITH DIFFERENTIAL/PLATELET
Abs Immature Granulocytes: 0.01 K/uL (ref 0.00–0.07)
Basophils Absolute: 0 K/uL (ref 0.0–0.1)
Basophils Relative: 2 %
Eosinophils Absolute: 0.1 K/uL (ref 0.0–0.5)
Eosinophils Relative: 4 %
HCT: 24.5 % — ABNORMAL LOW (ref 36.0–46.0)
Hemoglobin: 7.7 g/dL — ABNORMAL LOW (ref 12.0–15.0)
Immature Granulocytes: 1 %
Lymphocytes Relative: 23 %
Lymphs Abs: 0.4 K/uL — ABNORMAL LOW (ref 0.7–4.0)
MCH: 28 pg (ref 26.0–34.0)
MCHC: 31.4 g/dL (ref 30.0–36.0)
MCV: 89.1 fL (ref 80.0–100.0)
Monocytes Absolute: 0.3 K/uL (ref 0.1–1.0)
Monocytes Relative: 16 %
Neutro Abs: 1 K/uL — ABNORMAL LOW (ref 1.7–7.7)
Neutrophils Relative %: 54 %
Platelets: 57 K/uL — ABNORMAL LOW (ref 150–400)
RBC: 2.75 MIL/uL — ABNORMAL LOW (ref 3.87–5.11)
RDW: 17.3 % — ABNORMAL HIGH (ref 11.5–15.5)
WBC: 1.8 K/uL — ABNORMAL LOW (ref 4.0–10.5)
nRBC: 0 % (ref 0.0–0.2)

## 2024-02-28 LAB — COMPREHENSIVE METABOLIC PANEL WITH GFR
ALT: 23 U/L (ref 0–44)
AST: 43 U/L — ABNORMAL HIGH (ref 15–41)
Albumin: 2.7 g/dL — ABNORMAL LOW (ref 3.5–5.0)
Alkaline Phosphatase: 70 U/L (ref 38–126)
Anion gap: 8 (ref 5–15)
BUN: 5 mg/dL — ABNORMAL LOW (ref 6–20)
CO2: 23 mmol/L (ref 22–32)
Calcium: 8 mg/dL — ABNORMAL LOW (ref 8.9–10.3)
Chloride: 101 mmol/L (ref 98–111)
Creatinine, Ser: 0.7 mg/dL (ref 0.44–1.00)
GFR, Estimated: >60 mL/min (ref >60)
Glucose, Bld: 199 mg/dL — ABNORMAL HIGH (ref 70–99)
Potassium: 3.7 mmol/L (ref 3.5–5.1)
Sodium: 132 mmol/L — ABNORMAL LOW (ref 135–145)
Total Bilirubin: 4.1 mg/dL — ABNORMAL HIGH (ref 0.0–1.2)
Total Protein: 5.7 g/dL — ABNORMAL LOW (ref 6.5–8.1)

## 2024-02-28 LAB — PATHOLOGIST SMEAR REVIEW: Path Review: REACTIVE

## 2024-02-28 LAB — GLUCOSE, CAPILLARY
Glucose-Capillary: 184 mg/dL — ABNORMAL HIGH (ref 70–99)
Glucose-Capillary: 265 mg/dL — ABNORMAL HIGH (ref 70–99)
Glucose-Capillary: 216 mg/dL — ABNORMAL HIGH (ref 70–99)
Glucose-Capillary: 278 mg/dL — ABNORMAL HIGH (ref 70–99)

## 2024-02-28 LAB — MAGNESIUM: Magnesium: 1.9 mg/dL (ref 1.7–2.4)

## 2024-02-28 MED ORDER — INSULIN ASPART 100 UNIT/ML IJ SOLN
4.0000 [IU] | Freq: Three times a day (TID) | INTRAMUSCULAR | Status: DC
  Administered 2024-02-28: 4 [IU] via SUBCUTANEOUS
  Filled 2024-02-28 (×2): qty 4

## 2024-02-28 MED ORDER — ALBUMIN HUMAN 25 % IV SOLN
50.0000 g | Freq: Once | INTRAVENOUS | Status: AC
  Administered 2024-02-29: 50 g via INTRAVENOUS
  Filled 2024-02-28: qty 200

## 2024-02-28 NOTE — Telephone Encounter (Signed)
-----   Message from Greig Corti sent at 02/28/2024 10:41 AM EST ----- Regarding: notify  [facility] Ratasha Fabre, this patient is known to [facility]-following very closely as she is high on the transplant list.  Please place a call to [facility] and let her know patient has been readmitted again with abdominal pain and large volume ascites.  She is requiring admissions every week.  Please asked Dawn to place orders again for every 5 to 7-day large-volume paracenteses with albumin   She also needs a very soon follow-up there

## 2024-02-28 NOTE — Telephone Encounter (Signed)
 Spoke with office [facility]. Message will be sent to Stephane Quest, NP.

## 2024-02-28 NOTE — Evaluation (Signed)
 Physical Therapy Evaluation Patient Details Name: Laurie Sutton MRN: [id number] DOB: [birth year] Today's Date: 02/28/2024  History of Present Illness  Laurie Sutton is a 43 y.o. female who presents to the ER from home due to severe diffuse abdominal pain. Found to have large ascites s/p paracentesis (3-4 Liters). 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  She does report one fall when entering her home after last hospitalization when she tripped though denies injury.  States family supportive and no concerns for mobility at d/c.  Able to ambulate without assistance to bathroom per her report and no LOB or physical help for hallway ambulation and stairs.  Patient stable for home when medically ready without follow up PT.  Will sign off as pt close to baseline.  Did encourage fall prevention techniques and mobility to reduce effects of bedrest during remainder of hospitalization.         If plan is discharge home, recommend the following:     Can travel by private vehicle        Equipment Recommendations None recommended by PT  Recommendations for Other Services       Functional Status Assessment Patient has not had a recent decline in their functional status     Precautions / Restrictions Precautions Precautions: Fall Precaution/Restrictions Comments: one fall after last d/c home tripped entering her home      Mobility  Bed Mobility Overal bed mobility: Modified Independent                  Transfers Overall transfer level: Modified independent                      Ambulation/Gait Ambulation/Gait  assistance: Independent Gait Distance (Feet): 150 Feet Assistive device: None Gait Pattern/deviations: Step-through pattern, Decreased stride length, Wide base of support       General Gait Details: slightly wider BOS, no LOB, some decreased stride length and step height though not scuffing her feet and wearing shoes with soles to ambulate  Stairs Stairs: Yes Stairs assistance: Supervision Stair Management: One rail Right, Alternating pattern, Step to pattern, Forwards Number of Stairs: 4 General stair comments: ascending step through sequence, descending mild knee instability with step through so she chose step to sequence with railing  Wheelchair Mobility     Tilt Bed    Modified Rankin (Stroke Patients Only)       Balance Overall balance assessment: Needs assistance   Sitting balance-Leahy Scale: Good     Standing balance support: No upper extremity supported Standing balance-Leahy Scale: Good                               Pertinent Vitals/Pain Pain Assessment Pain Assessment: Faces Faces Pain Scale: Hurts a little bit Pain Location: abdomen Pain Descriptors / Indicators: Discomfort Pain Intervention(s): Monitored during session    Home Living Family/patient expects to be discharged to:: Private residence Living Arrangements: Spouse/significant other;Children;Parent Available Help at Discharge: Family;Available 24 hours/day Type of Home: House Home Access: Stairs to enter   Entrance Stairs-Number of Steps: 2 Alternate Level Stairs-Number of Steps: 6&6 Home Layout: Multi-level Home Equipment: Agricultural Consultant (2 wheels);Shower seat;Wheelchair - manual  Prior Function Prior Level of Function : Independent/Modified Independent               ADLs Comments: indep, has not driven or worked since symptoms started     Extremity/Trunk Assessment        Lower Extremity Assessment Lower Extremity Assessment: Generalized weakness     Cervical / Trunk Assessment Cervical / Trunk Assessment: Other exceptions Cervical / Trunk Exceptions: distended abdomen  Communication   Communication Communication: No apparent difficulties    Cognition Arousal: Alert Behavior During Therapy: WFL for tasks assessed/performed   PT - Cognitive impairments: No apparent impairments                         Following commands: Intact       Cueing       General Comments General comments (skin integrity, edema, etc.): BP seated EOB 104/44, HR 81; standing 109/90, HR 87    Exercises     Assessment/Plan    PT Assessment Patient does not need any further PT services  PT Problem List         PT Treatment Interventions      PT Goals (Current goals can be found in the Care Plan section)  Acute Rehab PT Goals PT Goal Formulation: All assessment and education complete, DC therapy    Frequency       Co-evaluation               AM-PAC PT 6 Clicks Mobility  Outcome Measure Help needed turning from your back to your side while in a flat bed without using bedrails?: None Help needed moving from lying on your back to sitting on the side of a flat bed without using bedrails?: None Help needed moving to and from a bed to a chair (including a wheelchair)?: None Help needed standing up from a chair using your arms (e.g., wheelchair or bedside chair)?: None Help needed to walk in hospital room?: None Help needed climbing 3-5 steps with a railing? : None 6 Click Score: 24    End of Session   Activity Tolerance: Patient tolerated treatment well Patient left: in bed;with call bell/phone within reach   PT Visit Diagnosis: Muscle weakness (generalized) (M62.81)    Time: 8847-8794 PT Time Calculation (min) (ACUTE ONLY): 13 min   Charges:   PT Evaluation $PT Eval Low Complexity: 1 Low   PT General Charges $$ ACUTE PT VISIT: 1 Visit         Micheline Portal, PT Acute Rehabilitation  Services Office:[phone] 02/28/2024   Montie Portal 02/28/2024, 12:29 PM

## 2024-02-28 NOTE — Progress Notes (Addendum)
 Patient ID: Laurie Sutton, female   DOB: 1980-04-13, 43 y.o.   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  Mentation is good today  Labs-WBC 1.8/hemoglobin 7.7/hematocrit 24.5/platelets 57 Sodium 132/potassium 3.7/BUN 5/creatinine 0.7 T. bili 4.1/AST 43/ALT 23/albumin  2.7   Objective   Vital signs in last 24 hours: Temp:  [97.9 F (36.6 C)-98.6 F (37 C)] 97.9 F (36.6 C) (11/18 1554) Pulse Rate:  [81-105] 84 (11/18 1554) Resp:  [18-20] 20 (11/18 1554) BP: (95-106)/(49-63) 106/63 (11/18 1554) SpO2:  [91 %-100 %] 100 % (11/18 1554) Last BM Date : 02/28/24 General:   Young white female in NAD, chronically ill-appearing, pleasant, mentating well Heart:  Regular rate and rhythm; no murmurs Lungs: Respirations even and unlabored, lungs CTA bilaterally Abdomen:  Soft, no focal tenderness, large amount of nontense ascites, bowel sounds are present Extremities:  Without edema. Neurologic:  Alert and oriented,  grossly normal neurologically.  No asterixis today Psych:  Cooperative. Normal mood and affect.  Intake/Output from previous day: 11/17 0701 - 11/18 0700 In: 236 [P.O.:236] Out: -  Intake/Output this shift: No intake/output data recorded.  Lab Results: Recent Labs    02/26/24 0033 02/27/24 0438 02/28/24 0427  WBC 3.4* 2.1* 1.8*  HGB 9.2* 7.6* 7.7*  HCT 29.2* 24.4* 24.5*  PLT 78* 59* 57*    BMET Recent Labs    02/27/24 0438 02/27/24 1929 02/28/24 0427  NA 134* 131* 132*  K 2.9* 3.5 3.7  CL 99 99 101  CO2 25 22 23   GLUCOSE 155* 224* 199*  BUN 6 <5* 5*  CREATININE 0.65 0.67 0.70  CALCIUM  8.3* 8.1* 8.0*   LFT Recent Labs    02/28/24 0427  PROT 5.7*  ALBUMIN  2.7*  AST 43*  ALT 23  ALKPHOS 70  BILITOT 4.1*   PT/INR Recent Labs    02/26/24 0300  LABPROT 20.6*  INR 1.7*    Studies/Results: IR Paracentesis Result Date: 02/27/2024 INDICATION: 43 year old female. History of NASH cirrhosis with recurrent ascites. Request for therapeutic paracentesis EXAM: ULTRASOUND GUIDED LEFT-SIDED THERAPEUTIC PARACENTESIS MEDICATIONS: Hip lidocaine  1%.  10 mL COMPLICATIONS: None immediate. PROCEDURE: Informed written consent was obtained from the patient after a discussion of the risks, benefits and alternatives to treatment. A timeout was performed prior to the initiation of the procedure. Initial ultrasound scanning demonstrates a large amount of ascites within the left lower abdominal quadrant. The left lower abdomen was prepped and draped in the usual sterile fashion. 1% lidocaine  was used for local anesthesia. Following this, a 19 gauge, 7-cm, Yueh catheter was introduced. An ultrasound image was saved for documentation purposes. The paracentesis was performed. The catheter was removed and a dressing was applied. The patient tolerated the procedure well without immediate post procedural complication. SABRA FINDINGS: A total of approximately 4 L of straw-colored fluid was removed. IMPRESSION: Successful ultrasound-guided left-sided therapeutic paracentesis yielding 4 liters of peritoneal fluid. Performed by Delon Beagle NP PLAN: Patient currently being worked up for possible transplant at [facility]. Electronically  Signed   By: Cordella Banner   On: 02/27/2024 15:55       Assessment / Plan:    #91 43 year old female with decompensated MASH cirrhosis with refractory  ascites, and hepatic encephalopathy. Patient has had multiple recent hospitalizations due to abdominal pain related to tense ascites which tends to improve post paracentesis.  She has gotten to the point where she is requiring paracentesis more than once weekly for control of symptoms.  4 liter paracentesis yesterday, no SBP Has a large amount of ascites still present  MELD-Na=21  Patient could be considered for TIPS, however she does have underlying hepatic encephalopathy which has been manageable thus far .  MELD is also on the high side for consideration of TIPS. Given that she is high on the list for transplant, will not push for TIPS at this time Per suspect that worker did need to get her set up for a paracentesis every 4 to 5 days to try to prevent very frequent rehospitalization's. She will need careful lab management outpatient if having very frequent large-volume paracenteses.  #2 hepatic encephalopathy-stable mentating well today Continues on lactulose  3 times daily and Xifaxan  twice daily  #3 prolonged QT #4 restless leg syndrome #5 GERD #6 pancytopenia slowly progressive secondary to underlying severe liver disease  Plan; continue 2 g sodium diet Continue current doses of lactulose  and twice daily Xifaxan  Will schedule for large-volume paracentesis tomorrow-8 L, with albumin  postprocedure  Will need to arrange via [facility] for her to have outpatient large-volume paracentesis every 4 to 5 days and most appropriate that labs also be followed very carefully via [facility]  Hopefully we can continue to manage as we are doing until she is able to receive a liver.   Principal Problem:   Intractable abdominal pain Active Problems:   Refractory ascites     LOS: 2 days   Amy Esterwood PA-C 02/28/2024, 4:01 PM    Attending physician's note   I have taken a history, reviewed the chart, and examined the patient. I performed a substantive portion of this encounter,  including complete performance of at least one of the key components, in conjunction with the APP. I agree with the APP's note, impression, and recommendations with my edits.   No acute events overnight.  Doing well after 4L paracentesis yesterday with plan for repeat large-volume paracentesis tomorrow.  She is otherwise having decreased intervals between paracentesis, so we discussed a plan to have outpatient orders modified via primary at [facility] to try to avoid some of these hospital admissions for her.  Would likely need weekly BMP checks for monitoring as well.  Otherwise, not likely a great candidate for TIPS with underlying hepatic encephalopathy and MELD >18.  A total of 35 minutes of time was spent on this encounter, including in depth chart review, independent review of results as outlined above, communicating results with the patient directly, face-to-face time with the patient, coordinating care, and ordering studies and medications as appropriate, and documentation.    [address], DO, FACG [phone] office

## 2024-02-28 NOTE — Plan of Care (Signed)

## 2024-02-28 NOTE — Progress Notes (Signed)
 [facility]                                                                               Laurie Sutton, is a 43 y.o. female, DOB - 1980-11-04, FMW:[id number] Admit date - 02/26/2024    Outpatient Primary MD for the patient is [name], MD  LOS - 2  days    Brief summary    Laurie Sutton is a 43 y.o. 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  Associated with abdominal distention.  The patient was recently admitted from 11/9 through 02/21/2024 due to decompensated cirrhosis with ascites, portal hypertension, and esophageal varices.  She was discharged to [facility] in [place] for liver transplant.  This did not take place and she is still on the liver transplant list.    She returns to [facility] ER with the above complaints.  Endorses some mild, pink tinged blood in her sputum from coughing so much.  Denies hematemesis . In the ER, the patient had therapeutic and diagnostic paracentesis done by EDP, Dr. Lorette, with 3 to 4 L of ascitic fluid removed.  She underwent another paracentesis on 11/17 and 4 L of fluid removed. For the cultures from ascites fluid have been negative. She also completed 4 doses of IV albumin .  GI consulted and are on board    Assessment & Plan    Assessment and Plan:    Intractable abdominal pain in the setting of large ascites Rule out SBP, ascites fluid cultures negative so far continue to monitor Paracentesis done twice in the last 24 hours and a total of 8 L of ascitic fluid removed.  Started on IV ceftriaxone , anti emetics, and  abdominal pain.  Abdominal pain has improved.    Decompensated cirrhosis with large ascites Follows with liver transplant team at [facility]. Will need close follow-up appointment Gi consulted. She will probably need twice weekly paracentesis moving forward. Patient is on lactulose  20 mg 3 times daily has not had a bowel movement yet will increase it to 30 mg 3 times daily. Continue with rifaximin . Holding all diuretics for now for borderline blood pressure parameters     Type 2 diabetes with hyperglycemia Last hemoglobin A1c 7.9 CBG (last 3)  Recent Labs    02/27/24 1608 02/27/24 2056 02/28/24 0804  GLUCAP 226* 296* 184*   Continue with sliding scale insulin  and add 4 units of NovoLog  3 times daily AC.     QTc prolongation Admission twelve-lead EKG with QTc of 565. Repeat EKG today.  Avoid QTc prolonging agents Optimize magnesium  and potassium level.  Keep magnesium  greater than 2 and potassium greater than 4 Monitor on telemetry   Hypokalemia Hypomagnesemia Replace as needed.    Chronic hyponatremia Probably from fluid overload from decompensated cirrhosis Sodium has slowly improved to 134 Fluid restriction IV albumin  Encourage oral protein calorie intake as tolerated.  Hyperammonemia Ammonia level 75 Report compliance with home lactulose  and rifaximin  with 2 bowel movements daily.  No bowel movement in the last 24 hours.  Increase lactulose  to 30 mg 3 times daily. Resume home medications.   Generalized weakness PT OT evaluation Fall precautions   Coagulopathy INR 1.7 Continue to monitor   Pancytopenia  Hemoglobin  continues to drop from 9 to 7.6 to 7.7 WBC count is 2.1 to 1.8. and platelets have dropped from 78-59 to 57. No obvious signs of bleeding at this time   Chronic hypotension Resume home midodrine  Closely monitor vital signs   Chronic anxiety/depression Resume home Prozac    GERD Resume home PPI   Restless leg syndrome Resume home  ropinirole       Estimated body mass index is 29.71 kg/m as calculated from the following:   Height as of this encounter: 5' 6 (1.676 m).   Weight as of this encounter: 83.5 kg.  Code Status: full code.  DVT Prophylaxis:  SCDs Start: 02/26/24 9376   Level of Care: Level of care: Telemetry Family Communication: none at bedside.   Disposition Plan:     Remains inpatient appropriate:  pending clinical improvement.   Procedures:  US  paracentesis x 2  Consultants:   gastroenterology  Antimicrobials:   Anti-infectives (From admission, onward)    Start     Dose/Rate Route Frequency Ordered Stop   02/26/24 1000  rifaximin  (XIFAXAN ) tablet 550 mg        550 mg Oral 2 times daily 02/26/24 0708     02/26/24 0645  cefTRIAXone  (ROCEPHIN ) 2 g in sodium chloride  0.9 % 100 mL IVPB        2 g 200 mL/hr over 30 Minutes Intravenous Daily 02/26/24 0625          Medications  Scheduled Meds:  FLUoxetine   40 mg Oral BID   insulin  aspart  0-5 Units Subcutaneous QHS   insulin  aspart  0-9 Units Subcutaneous TID WC   insulin  aspart  2 Units Subcutaneous TID WC   lactulose   30 g Oral TID   midodrine   15 mg Oral TID with meals   pantoprazole   40 mg Oral BID   rifaximin   550 mg Oral BID   rOPINIRole   1 mg Oral QHS   Continuous Infusions:  cefTRIAXone  (ROCEPHIN )  IV 2 g (02/28/24 0848)   PRN Meds:.dronabinol, fentaNYL  (SUBLIMAZE ) injection, LORazepam , polyethylene glycol, prochlorperazine , temazepam     Subjective:   Laurie Sutton was seen and examined today.  No new complaints.   Objective:   Vitals:   02/28/24 0208 02/28/24 0356 02/28/24 0556 02/28/24 0744  BP: (!) 99/58 (!) 106/50 (!) 100/52 (!) 95/50  Pulse: 85 (!) 105 90 87  Resp:   20 18  Temp:  98.6 F (37 C) 98.4 F (36.9 C) 98 F (36.7 C)  TempSrc:  Oral Oral   SpO2: 93% 98% 100% 95%  Weight:      Height:        Intake/Output Summary (Last 24 hours) at 02/28/2024 0848 Last data filed at 02/27/2024  1941 Gross per 24 hour  Intake 236 ml  Output --  Net 236 ml   Filed Weights   02/26/24 0019  Weight: 83.5 kg     Exam  General exam: Appears calm and comfortable  Respiratory system: Clear to auscultation. Respiratory effort normal. Cardiovascular system: S1 & S2 heard, RRR.  Gastrointestinal system: Abdomen is firm, distended, mildly tender at the lateral aspect.  Central nervous system:  Alert and oriented. No focal neurological deficits. Extremities: Pedal edema resolved.  Skin: No rashes,  Psychiatry: Mood & affect appropriate.   .    Data Reviewed:  I have personally reviewed following labs and imaging studies   CBC Lab Results  Component Value Date   WBC 1.8 (L) 02/28/2024   RBC 2.75 (L) 02/28/2024   HGB 7.7 (L) 02/28/2024   HCT 24.5 (L) 02/28/2024   MCV 89.1 02/28/2024   MCH 28.0 02/28/2024   PLT 57 (L) 02/28/2024   MCHC 31.4 02/28/2024   RDW 17.3 (H) 02/28/2024   LYMPHSABS 0.4 (L) 02/28/2024   MONOABS 0.3 02/28/2024   EOSABS 0.1 02/28/2024   BASOSABS 0.0 02/28/2024     Last metabolic panel Lab Results  Component Value Date   NA 132 (L) 02/28/2024   K 3.7 02/28/2024   CL 101 02/28/2024   CO2 23 02/28/2024   BUN 5 (L) 02/28/2024   CREATININE 0.70 02/28/2024   GLUCOSE 199 (H) 02/28/2024   GFRNONAA >60 02/28/2024   GFRAA >60 08/09/2019   CALCIUM  8.0 (L) 02/28/2024   PHOS 3.2 02/27/2024   PROT 5.7 (L) 02/28/2024   ALBUMIN  2.7 (L) 02/28/2024   BILITOT 4.1 (H) 02/28/2024   ALKPHOS 70 02/28/2024   AST 43 (H) 02/28/2024   ALT 23 02/28/2024   ANIONGAP 8 02/28/2024    CBG (last 3)  Recent Labs    02/27/24 1608 02/27/24 2056 02/28/24 0804  GLUCAP 226* 296* 184*      Coagulation Profile: Recent Labs  Lab 02/26/24 0300  INR 1.7*     Radiology Studies: IR Paracentesis Result Date: 02/27/2024 INDICATION: 43 year old female. History of NASH cirrhosis with recurrent ascites. Request for therapeutic paracentesis EXAM: ULTRASOUND GUIDED  LEFT-SIDED THERAPEUTIC PARACENTESIS MEDICATIONS: Hip lidocaine  1%.  10 mL COMPLICATIONS: None immediate. PROCEDURE: Informed written consent was obtained from the patient after a discussion of the risks, benefits and alternatives to treatment. A timeout was performed prior to the initiation of the procedure. Initial ultrasound scanning demonstrates a large amount of ascites within the left lower abdominal quadrant. The left lower abdomen was prepped and draped in the usual sterile fashion. 1% lidocaine  was used for local anesthesia. Following this, a 19 gauge, 7-cm, Yueh catheter was introduced. An ultrasound image was saved for documentation purposes. The paracentesis was performed. The catheter was removed and a dressing was applied. The patient tolerated the procedure well without immediate post procedural complication. SABRA FINDINGS: A total of approximately 4 L of straw-colored fluid was removed. IMPRESSION: Successful ultrasound-guided left-sided therapeutic paracentesis yielding 4 liters of peritoneal fluid. Performed by Delon Beagle NP PLAN: Patient currently being worked up for possible transplant at [facility]. Electronically Signed   By: Cordella Banner   On: 02/27/2024 15:55       Elgie Butter M.D. [facility] 02/28/2024, 8:48 AM  Available via Epic secure chat 7am-7pm After 7 pm, please refer to night coverage provider listed on amion.

## 2024-02-29 ENCOUNTER — Inpatient Hospital Stay (HOSPITAL_COMMUNITY)

## 2024-02-29 DIAGNOSIS — R109 Unspecified abdominal pain: Secondary | ICD-10-CM | POA: Diagnosis not present

## 2024-02-29 HISTORY — PX: IR PARACENTESIS: IMG2679

## 2024-02-29 LAB — BODY FLUID CELL COUNT WITH DIFFERENTIAL
Eos, Fluid: 2 %
Lymphs, Fluid: 51 %
Monocyte-Macrophage-Serous Fluid: 43 % — ABNORMAL LOW (ref 50–90)
Neutrophil Count, Fluid: 4 % (ref 0–25)
Total Nucleated Cell Count, Fluid: 103 uL (ref 0–1000)

## 2024-02-29 LAB — GLUCOSE, CAPILLARY
Glucose-Capillary: 211 mg/dL — ABNORMAL HIGH (ref 70–99)
Glucose-Capillary: 202 mg/dL — ABNORMAL HIGH (ref 70–99)
Glucose-Capillary: 181 mg/dL — ABNORMAL HIGH (ref 70–99)

## 2024-02-29 LAB — GRAM STAIN: Gram Stain: NONE SEEN

## 2024-02-29 LAB — BODY FLUID CULTURE W GRAM STAIN
Culture: NO GROWTH
Gram Stain: NONE SEEN

## 2024-02-29 MED ORDER — INSULIN GLARGINE-YFGN 100 UNIT/ML ~~LOC~~ SOLN
16.0000 [IU] | Freq: Every day | SUBCUTANEOUS | Status: DC
  Filled 2024-02-29: qty 0.16

## 2024-02-29 MED ORDER — LIDOCAINE HCL 1 % IJ SOLN
INTRAMUSCULAR | Status: AC
  Filled 2024-02-29: qty 20

## 2024-02-29 MED ORDER — LACTULOSE 10 GM/15ML PO SOLN: 30.0000 g | Freq: Three times a day (TID) | ORAL | Status: AC

## 2024-02-29 MED ORDER — LIDOCAINE HCL 1 % IJ SOLN
20.0000 mL | Freq: Once | INTRAMUSCULAR | Status: AC
  Administered 2024-02-29: 10 mL via INTRADERMAL
  Filled 2024-02-29: qty 20

## 2024-02-29 NOTE — Procedures (Signed)
 PROCEDURE SUMMARY:  Successful US  guided paracentesis from left lateral abdomen.  Yielded 7.4 liters of dark yellow fluid.  No immediate complications.  Patient tolerated well.  EBL = trace  Specimen sent for labs.  Korynn Kenedy CHRISTELLA Bal PA-C 02/29/2024 8:38 AM

## 2024-02-29 NOTE — Plan of Care (Signed)

## 2024-02-29 NOTE — Progress Notes (Signed)
 Discharge   Patient expressed verbal understanding of discharge POC.   Patient given time to ask any questions.  Additional education included in AVS.  Alert oriented in good spirits.   Tele and PIV removed by Primary RN .  Dressing CDI.   Discharging to Main A.

## 2024-02-29 NOTE — Discharge Summary (Signed)
 Physician Discharge Summary   Patient: Laurie Sutton MRN: [id number] DOB: [birth year]  Admit date:     02/26/2024  Discharge date: 02/29/24  Discharge Physician: Deliliah Room   PCP: [name], MD   Recommendations at discharge:    F/u with PCP in one week F/u with [facility] on the scheduled appointment Continue taking meds as prescribed Check BP and HR daily  Discharge Diagnoses: Principal Problem:   Intractable abdominal pain Active Problems:   Refractory ascites   Liver cirrhosis secondary to NASH (nonalcoholic steatohepatitis) [facility])   Hospital Course: 43 y.o. female with medical history significant for end-stage liver disease secondary to MASLD cirrhosis on furosemide , spironolactone , midodrine , rifaximin , history of SBP, hepatic encephalopathy, portal hypertension with esophageal varices, chronic hyponatremia, type 2 diabetes, chronic anxiety/depression, GERD, restless leg syndrome, chronic hypotension on midodrine , who presented to [facility] ER on 11/16 from home due to severe diffuse abdominal pain associated with abdominal distention.  The patient was recently admitted from 11/9 through 02/21/2024 due to decompensated cirrhosis with ascites, portal hypertension, and esophageal varices.  She was discharged to [facility] in [place] for liver transplant.  She is still on the liver transplant list.   Intractable abdominal pain in the setting of large ascites Ruled out SBP, ascites fluid cultures negative  DC IV rocephin  on discharge Abdominal pain has improved after large volume paracentesis.     Decompensated cirrhosis with large ascites Follows with liver transplant team at [facility]. Will need close follow-up appointment GI consulted. Continue with lactulose  30 g TID Continue with rifaximin . Continue with spironolactone . F/u BP closely at home. S/p removal of 7.4 Liters (followed by IV albumin ) ascitic fluid on 11/19 and 4 liters on 11/17 by IR.  Disposition:  Home  Case discussed with Dr San on the discharge day.     Pain control - [place]  Controlled Substance Reporting System database was reviewed. and patient was instructed, not to drive, operate heavy machinery, perform activities at heights, swimming or participation in water activities or provide baby-sitting services while on Pain, Sleep and Anxiety Medications; until their outpatient Physician has advised to do so again. Also recommended to not to take more than prescribed Pain, Sleep and Anxiety Medications.  Consultants: GI Procedures performed: paracentesis  Disposition: Home Diet recommendation:  Cardiac diet DISCHARGE MEDICATION: Allergies as of 02/29/2024       Reactions   Morphine  Itching, Rash   Severe    Penicillins Hives   Has patient had a PCN reaction causing immediate rash, facial/tongue/throat swelling, SOB or lightheadedness with hypotension: yes. Rash  Has patient had a PCN reaction causing severe rash involving mucus membranes or skin necrosis: Yes- rash and hives all over body  Has patient had a PCN reaction that required hospitalization No Has patient had a PCN reaction occurring within the last 10 years: Yes  If all of the above answers are NO, then may proceed with Cephalosporin use.   Vitamin K  And Related Other (See Comments)   Electric shock    Latex Itching, Other (See Comments)   IRRITATES VAGINAL AREA   Vancomycin  Itching   Amoxicillin Hives, Itching   Azithromycin  Other (See Comments)   DOES NOT WORK   Dulaglutide  Other (See Comments)   constipation and stomach issues  **Trulicity **   Empagliflozin -metformin  Hcl Er Swelling, Other (See Comments)   Januvia  [sitagliptin ] Other (See Comments)   HURTS STOAMCH   Metformin  Other (See Comments)   Other Reaction(s): achy all over  Medication List     TAKE these medications    baclofen 10 MG tablet Commonly known as: LIORESAL Take 10 mg by mouth once a week. After  paracentesis   clotrimazole -betamethasone  cream Commonly known as: LOTRISONE  Apply 1 Application topically daily as needed (rash).   Dexcom G7 Sensor Misc Use 1 sensor for continuous glucose monitoring every 10 days for 30 days   ergocalciferol  1.25 MG (50000 UT) capsule Commonly known as: VITAMIN D2 Take 50,000 Units by mouth once a week. Take weekly on Thursday   FLUoxetine  40 MG capsule Commonly known as: PROZAC  Take 1 capsule (40 mg total) by mouth 2 (two) times daily.   HUMULIN  R 500 UNIT/ML injection Generic drug: insulin  regular human CONCENTRATED Inject 60-65 Units into the skin See admin instructions. 65 in the morning, 60 in the afternoon and 60 at bedtime   lactulose  10 GM/15ML solution Commonly known as: CHRONULAC  Take 45 mLs (30 g total) by mouth 3 (three) times daily. What changed:  how much to take when to take this   midodrine  5 MG tablet Commonly known as: PROAMATINE  Take 3 tablets (15 mg total) by mouth with breakfast, with lunch, and with evening meal. What changed:  how much to take when to take this   montelukast  10 MG tablet Commonly known as: SINGULAIR  Take 1 tablet (10 mg total) by mouth at bedtime.   omeprazole  40 MG capsule Commonly known as: PRILOSEC Take 40 mg by mouth in the morning. May take an additional dose as needed   ondansetron  4 MG disintegrating tablet Commonly known as: ZOFRAN -ODT Take 1 tablet (4 mg total) by mouth every 8 (eight) hours as needed for nausea or vomiting.   rifaximin  550 MG Tabs tablet Commonly known as: XIFAXAN  Take 550 mg by mouth 2 (two) times daily.   rOPINIRole  1 MG tablet Commonly known as: REQUIP  Take 1 tablet (1 mg total) by mouth at bedtime.   spironolactone  50 MG tablet Commonly known as: ALDACTONE  Take 1 tablet (50 mg total) by mouth daily.   temazepam  30 MG capsule Commonly known as: RESTORIL  Take 1 capsule (30 mg total) by mouth at bedtime as needed for sleep. What changed: when to take  this        Follow-up Information     [name], MD. Schedule an appointment as soon as possible for a visit in 1 week(s).   Specialty: Family Medicine Contact information: [address] [place] [address] [phone]         [employer]. Schedule an appointment as soon as possible for a visit in 1 week(s).   Contact information: [address] [place] [address] [phone]                Discharge Exam: Fredricka Weights   02/26/24 0019  Weight: 83.5 kg   Constitutional: NAD, calm, comfortable Eyes: PERRL, lids and conjunctivae normal ENMT: Mucous membranes are moist. Posterior pharynx clear of any exudate or lesions.Normal dentition.  Neck: normal, supple, no masses, no thyromegaly Respiratory: clear to auscultation bilaterally, no wheezing, no crackles. Normal respiratory effort. No accessory muscle use.  Cardiovascular: Regular rate and rhythm, no murmurs / rubs / gallops. No extremity edema. 2+ pedal pulses. No carotid bruits.  Abdomen: Slightly distended, no tenderness, no masses palpated. No hepatosplenomegaly. Bowel sounds positive.  Musculoskeletal: no clubbing / cyanosis. No joint deformity upper and lower extremities. Good ROM, no contractures. Normal muscle tone.  Skin: no rashes, lesions,  ulcers. No induration Neurologic: CN 2-12 grossly intact. Sensation intact, DTR normal. Strength 5/5 x all 4 extremities.  Psychiatric: Normal judgment and insight. Alert and oriented x 3. Normal mood.    Condition at discharge: good  The results of significant diagnostics from this hospitalization (including imaging, microbiology, ancillary and laboratory) are listed below for reference.   Imaging Studies:   Microbiology: Results for orders placed or performed during the hospital encounter of 02/26/24  Body fluid culture w Gram Stain     Status: None   Collection Time: 02/26/24  5:08 AM   Specimen: Peritoneal  Cavity; Peritoneal Fluid  Result Value Ref Range Status   Specimen Description PERITONEAL CAVITY  Final   Special Requests NONE  Final   Gram Stain NO WBC SEEN NO ORGANISMS SEEN CYTOSPIN SMEAR   Final   Culture   Final    NO GROWTH 3 DAYS Performed at [facility], [address] [place], [place] [address]    Report Status 02/29/2024 FINAL  Final  Gram stain     Status: None   Collection Time: 02/29/24  8:47 AM   Specimen: Peritoneal Washings  Result Value Ref Range Status   Specimen Description PERITONEAL  Final   Special Requests ABDOMEN  Final   Gram Stain   Final    NO WBC SEEN NO ORGANISMS SEEN Performed at [facility], [address], [place], [place] [address]    Report Status 02/29/2024 FINAL  Final   *Note: Due to a large number of results and/or encounters for the requested time period, some results have not been displayed. A complete set of results can be found in Results Review.    Labs: CBC: Recent Labs  Lab 02/26/24 0033 02/27/24 0438 02/28/24 0427  WBC 3.4* 2.1* 1.8*  NEUTROABS  --  1.3* 1.0*  HGB 9.2* 7.6* 7.7*  HCT 29.2* 24.4* 24.5*  MCV 87.7 89.1 89.1  PLT 78* 59* 57*   Basic Metabolic Panel: Recent Labs  Lab 02/26/24 0033 02/27/24 0438 02/27/24 1929 02/28/24 0427  NA 126* 134* 131* 132*  K 3.3* 2.9* 3.5 3.7  CL 94* 99 99 101  CO2 22 25 22 23   GLUCOSE 387* 155* 224* 199*  BUN 9 6 <5* 5*  CREATININE 0.74 0.65 0.67 0.70  CALCIUM  8.1* 8.3* 8.1* 8.0*  MG  --  1.6*  --  1.9  PHOS  --  3.2  --   --    Liver Function Tests: Recent Labs  Lab 02/26/24 0033 02/27/24 0438 02/28/24 0427  AST 47* 38 43*  ALT 31 24 23   ALKPHOS 108 68 70  BILITOT 5.3* 5.7* 4.1*  PROT 6.3* 6.1* 5.7*  ALBUMIN  2.7* 3.2* 2.7*   CBG: Recent Labs  Lab 02/28/24 1554 02/28/24 2204 02/29/24 0803 02/29/24 0946 02/29/24 1157  GLUCAP 216* 278* 211* 202* 181*    Discharge time spent: 44 minutes.  Signed: Deliliah Room, MD [facility] 02/29/2024

## 2024-02-29 NOTE — Progress Notes (Signed)
 Occupational Therapy Treatment Patient Details Name: Laurie Sutton MRN: [id number] DOB: [birth year] Today's Date: 02/29/2024   History of present illness Laurie Sutton is a 43 y.o. female who presents to the ER from home due to severe diffuse abdominal pain. Found to have large ascites s/p paracentesis (3-4 Liters). PHMx: end-stage liver disease secondary to MASLD cirrhosis ,SBP, hepatic encephalopathy, portal hypertension with esophageal varices, chronic hyponatremia, type 2 diabetes, chronic anxiety/depression, GERD, restless leg syndrome, chronic hypotension, recent admission11/9-11/02/2024 due to decompensated cirrhosis with ascites   OT comments  Pt limited by pain however overall modified independent with mobility and ADL tasks. Husband/family can assist at DC as needed. No further OT needed. OT signing off.       If plan is discharge home, recommend the following:  Assist for transportation;Assistance with cooking/housework   Equipment Recommendations  None recommended by OT    Recommendations for Other Services      Precautions / Restrictions Precautions Precautions: None       Mobility Bed Mobility Overal bed mobility: Modified Independent                  Transfers Overall transfer level: Modified independent                       Balance Overall balance assessment: No apparent balance deficits (not formally assessed)                                         ADL either performed or assessed with clinical judgement   ADL                                         General ADL Comments: modified independent; educated on strateiges to reduce risk of falls; encouraged use of shower chair; discussed home management and setting up home to minimize need to ben/lift to reduce pain during mobility; rec use of reacher as needed; ablet o copmlete figure four position for  LB ADL    Extremity/Trunk Assessment               Vision       Perception     Praxis     Communication     Cognition Arousal: Alert Behavior During Therapy: WFL for tasks assessed/performed Cognition: No apparent impairments                                        Cueing      Exercises      Shoulder Instructions       General Comments      Pertinent Vitals/ Pain       Pain Assessment Pain Assessment: 0-10 Pain Score: 7  Pain Location: abdomen Pain Descriptors / Indicators: Discomfort Pain Intervention(s): Limited activity within patient's tolerance, Premedicated before session  Home Living                                          Prior Functioning/Environment              Frequency  Progress Toward Goals  OT Goals(current goals can now be found in the care plan section)  Progress towards OT goals: Goals met/education completed, patient discharged from OT  Acute Rehab OT Goals Patient Stated Goal: rest OT Goal Formulation: All assessment and education complete, DC therapy  Plan      Co-evaluation                 AM-PAC OT 6 Clicks Daily Activity     Outcome Measure   Help from another person eating meals?: None Help from another person taking care of personal grooming?: None Help from another person toileting, which includes using toliet, bedpan, or urinal?: None Help from another person bathing (including washing, rinsing, drying)?: None Help from another person to put on and taking off regular upper body clothing?: None Help from another person to put on and taking off regular lower body clothing?: None 6 Click Score: 24    End of Session    OT Visit Diagnosis: Muscle weakness (generalized) (M62.81)   Activity Tolerance Patient limited by pain   Patient Left in bed;with call bell/phone within reach   Nurse Communication Mobility status        Time: 8975-8967 OT Time Calculation (min): 8 min  Charges: OT General  Charges $OT Visit: 1 Visit OT Treatments $Self Care/Home Management : 8-22 mins  Kreg Sink, OT/L   Acute OT Clinical Specialist Acute Rehabilitation Services Pager [phone] Office [phone]   [facility] 02/29/2024, 10:40 AM

## 2024-02-29 NOTE — Inpatient Diabetes Management (Signed)
 Inpatient Diabetes Program Recommendations  AACE/ADA: New Consensus Statement on Inpatient Glycemic Control (2015)  Target Ranges:  Prepandial:   less than 140 mg/dL      Peak postprandial:   less than 180 mg/dL (1-2 hours)      Critically ill patients:  140 - 180 mg/dL   Lab Results  Component Value Date   GLUCAP 202 (H) 02/29/2024   HGBA1C 7.9 (H) 02/21/2024    Review of Glycemic Control  Latest Reference Range & Units 02/28/24 12:09 02/28/24 15:54 02/28/24 22:04 02/29/24 08:03 02/29/24 09:46  Glucose-Capillary 70 - 99 mg/dL 734 (H) 783 (H) 721 (H) 211 (H) 202 (H)   Diabetes history: DM 2 Outpatient Diabetes medications:  Dexcom G7 U500 65 units q AM, 60 units with lunch,  60 units q HS Current orders for Inpatient glycemic control:  Novolog  4 units tid with meals Novolog  0-9 units tid with meals and HS  Inpatient Diabetes Program Recommendations:    Note that patient was on U500 prior to admit.   While in the hospital, consider adding Semglee  16 units daily.   Thanks,  Randall Bullocks, RN, BC-ADM Inpatient Diabetes Coordinator Pager [phone]  (8a-5p)

## 2024-03-01 ENCOUNTER — Other Ambulatory Visit (HOSPITAL_COMMUNITY): Payer: Self-pay | Admitting: Nurse Practitioner

## 2024-03-01 ENCOUNTER — Telehealth: Payer: Self-pay

## 2024-03-01 DIAGNOSIS — R188 Other ascites: Secondary | ICD-10-CM

## 2024-03-01 NOTE — Transitions of Care (Post Inpatient/ED Visit) (Signed)
   03/01/2024  Name: Laurie Sutton MRN: [id number] DOB: [birth year]  Today's TOC FU Call Status: Today's TOC FU Call Status:: Unsuccessful Call (1st Attempt) Unsuccessful Call (1st Attempt) Date: 03/01/24  Attempted to reach the patient regarding the most recent Inpatient/ED visit.  Follow Up Plan: Additional outreach attempts will be made to reach the patient to complete the Transitions of Care (Post Inpatient/ED visit) call.   Alan Ee, RN, BSN, CEN [employer]- Transition of Care Team.  [facility] [phone]

## 2024-03-02 ENCOUNTER — Telehealth: Payer: Self-pay

## 2024-03-02 NOTE — Transitions of Care (Post Inpatient/ED Visit) (Signed)
   03/02/2024  Name: Laurie Sutton MRN: [id number] DOB: 1980/05/27  Today's TOC FU Call Status: Today's TOC FU Call Status:: Unsuccessful Call (2nd Attempt) Unsuccessful Call (2nd Attempt) Date: 03/02/24  Attempted to reach the patient regarding the most recent Inpatient/ED visit.  Follow Up Plan: Additional outreach attempts will be made to reach the patient to complete the Transitions of Care (Post Inpatient/ED visit) call.  Alan Ee, RN, BSN, CEN [employer]- Transition of Care Team.  [facility] [phone]

## 2024-03-05 ENCOUNTER — Telehealth: Payer: Self-pay

## 2024-03-05 LAB — CULTURE, BODY FLUID W GRAM STAIN -BOTTLE: Culture: NO GROWTH

## 2024-03-05 NOTE — Transitions of Care (Post Inpatient/ED Visit) (Signed)
   03/05/2024  Name: Laurie Sutton MRN: [id number] DOB: 1980-09-05  Today's TOC FU Call Status: Today's TOC FU Call Status:: Unsuccessful Call (3rd Attempt) Unsuccessful Call (3rd Attempt) Date: 03/05/24  Attempted to reach the patient regarding the most recent Inpatient/ED visit.  Follow Up Plan: No further outreach attempts will be made at this time. We have been unable to contact the patient.  Alan Ee, RN, BSN, CEN [employer]- Transition of Care Team.  [facility] [phone]

## 2024-03-06 ENCOUNTER — Ambulatory Visit (HOSPITAL_COMMUNITY)
Admission: RE | Admit: 2024-03-06 | Discharge: 2024-03-06 | Disposition: A | Discharge status: Home / Self Care | Source: Ambulatory Visit | Attending: Nurse Practitioner | Admitting: Nurse Practitioner

## 2024-03-06 DIAGNOSIS — R188 Other ascites: Secondary | ICD-10-CM | POA: Diagnosis present

## 2024-03-06 HISTORY — PX: IR PARACENTESIS: IMG2679

## 2024-03-06 MED ORDER — LIDOCAINE-EPINEPHRINE 1 %-1:100000 IJ SOLN
INTRAMUSCULAR | Status: AC
Start: 2024-03-06 — End: 2024-03-06
  Filled 2024-03-06: qty 1

## 2024-03-06 MED ORDER — LIDOCAINE-EPINEPHRINE 1 %-1:100000 IJ SOLN
20.0000 mL | Freq: Once | INTRAMUSCULAR | Status: AC
  Administered 2024-03-06: 20 mL via INTRADERMAL

## 2024-03-06 MED ORDER — ALBUMIN HUMAN 25 % IV SOLN
INTRAVENOUS | Status: AC
  Filled 2024-03-06: qty 300

## 2024-03-06 MED ORDER — ALBUMIN HUMAN 25 % IV SOLN
75.0000 g | Freq: Once | INTRAVENOUS | Status: AC
  Administered 2024-03-06: 75 g via INTRAVENOUS

## 2024-03-06 NOTE — Procedures (Signed)
 PROCEDURE SUMMARY:  Successful ultrasound guided paracentesis from the left lower quadrant.  Yielded 8 L of clear yellow fluid.  No immediate complications.  The patient tolerated the procedure well.   Specimen sent for labs.  EBL < 2 mL  ** Patient on the transplant list with [facility]  Warren Dais, AGACNP-BC 03/06/2024, 2:30 PM

## 2024-03-12 ENCOUNTER — Other Ambulatory Visit: Payer: Self-pay

## 2024-03-12 ENCOUNTER — Inpatient Hospital Stay (HOSPITAL_COMMUNITY)

## 2024-03-12 ENCOUNTER — Ambulatory Visit (HOSPITAL_COMMUNITY)

## 2024-03-12 ENCOUNTER — Encounter (HOSPITAL_COMMUNITY): Payer: Self-pay

## 2024-03-12 ENCOUNTER — Inpatient Hospital Stay (HOSPITAL_COMMUNITY)
Admission: RE | Admit: 2024-03-12 | Discharge: 2024-03-14 | DRG: 432 | Source: Ambulatory Visit | Attending: Internal Medicine | Admitting: Internal Medicine

## 2024-03-12 DIAGNOSIS — R509 Fever, unspecified: Secondary | ICD-10-CM | POA: Diagnosis not present

## 2024-03-12 DIAGNOSIS — R5383 Other fatigue: Secondary | ICD-10-CM | POA: Diagnosis not present

## 2024-03-12 DIAGNOSIS — R0689 Other abnormalities of breathing: Secondary | ICD-10-CM | POA: Diagnosis not present

## 2024-03-12 DIAGNOSIS — R0902 Hypoxemia: Secondary | ICD-10-CM | POA: Diagnosis not present

## 2024-03-12 DIAGNOSIS — I959 Hypotension, unspecified: Secondary | ICD-10-CM | POA: Diagnosis not present

## 2024-03-12 DIAGNOSIS — K7460 Unspecified cirrhosis of liver: Secondary | ICD-10-CM

## 2024-03-12 DIAGNOSIS — R188 Other ascites: Secondary | ICD-10-CM

## 2024-03-12 DIAGNOSIS — Z7401 Bed confinement status: Secondary | ICD-10-CM | POA: Diagnosis not present

## 2024-03-12 DIAGNOSIS — K7290 Hepatic failure, unspecified without coma: Secondary | ICD-10-CM | POA: Diagnosis not present

## 2024-03-12 HISTORY — PX: IR PARACENTESIS: IMG2679

## 2024-03-12 LAB — CBC WITH DIFFERENTIAL/PLATELET
Abs Immature Granulocytes: 0.01 K/uL (ref 0.00–0.07)
Basophils Absolute: 0 K/uL (ref 0.0–0.1)
Basophils Relative: 1 %
Eosinophils Absolute: 0 K/uL (ref 0.0–0.5)
Eosinophils Relative: 1 %
HCT: 25.4 % — ABNORMAL LOW (ref 36.0–46.0)
Hemoglobin: 8 g/dL — ABNORMAL LOW (ref 12.0–15.0)
Immature Granulocytes: 1 %
Lymphocytes Relative: 6 %
Lymphs Abs: 0.1 K/uL — ABNORMAL LOW (ref 0.7–4.0)
MCH: 28 pg (ref 26.0–34.0)
MCHC: 31.5 g/dL (ref 30.0–36.0)
MCV: 88.8 fL (ref 80.0–100.0)
Monocytes Absolute: 0.2 K/uL (ref 0.1–1.0)
Monocytes Relative: 9 %
Neutro Abs: 1.4 K/uL — ABNORMAL LOW (ref 1.7–7.7)
Neutrophils Relative %: 82 %
Platelets: 57 K/uL — ABNORMAL LOW (ref 150–400)
RBC: 2.86 MIL/uL — ABNORMAL LOW (ref 3.87–5.11)
RDW: 17.9 % — ABNORMAL HIGH (ref 11.5–15.5)
WBC: 1.7 K/uL — ABNORMAL LOW (ref 4.0–10.5)
nRBC: 0 % (ref 0.0–0.2)

## 2024-03-12 LAB — URINALYSIS, W/ REFLEX TO CULTURE (INFECTION SUSPECTED)
Bilirubin Urine: NEGATIVE
Glucose, UA: 50 mg/dL — AB
Ketones, ur: NEGATIVE mg/dL
Leukocytes,Ua: NEGATIVE
Nitrite: NEGATIVE
Protein, ur: >=300 mg/dL — AB
Specific Gravity, Urine: 1.022 (ref 1.005–1.030)
pH: 5 (ref 5.0–8.0)

## 2024-03-12 LAB — HEMOGLOBIN AND HEMATOCRIT, BLOOD
HCT: 22 % — ABNORMAL LOW (ref 36.0–46.0)
Hemoglobin: 6.9 g/dL — CL (ref 12.0–15.0)

## 2024-03-12 LAB — COMPREHENSIVE METABOLIC PANEL WITH GFR
ALT: 23 U/L (ref 0–44)
AST: 46 U/L — ABNORMAL HIGH (ref 15–41)
Albumin: 3.3 g/dL — ABNORMAL LOW (ref 3.5–5.0)
Alkaline Phosphatase: 58 U/L (ref 38–126)
Anion gap: 13 (ref 5–15)
BUN: 13 mg/dL (ref 6–20)
CO2: 21 mmol/L — ABNORMAL LOW (ref 22–32)
Calcium: 8.4 mg/dL — ABNORMAL LOW (ref 8.9–10.3)
Chloride: 97 mmol/L — ABNORMAL LOW (ref 98–111)
Creatinine, Ser: 0.9 mg/dL (ref 0.44–1.00)
GFR, Estimated: >60 mL/min (ref >60)
Glucose, Bld: 217 mg/dL — ABNORMAL HIGH (ref 70–99)
Potassium: 3.6 mmol/L (ref 3.5–5.1)
Sodium: 131 mmol/L — ABNORMAL LOW (ref 135–145)
Total Bilirubin: 5.3 mg/dL — ABNORMAL HIGH (ref 0.0–1.2)
Total Protein: 6 g/dL — ABNORMAL LOW (ref 6.5–8.1)

## 2024-03-12 LAB — ALBUMIN, PLEURAL OR PERITONEAL FLUID: Albumin, Fluid: <1.5 g/dL

## 2024-03-12 LAB — I-STAT CG4 LACTIC ACID, ED
Lactic Acid, Venous: 5.5 mmol/L (ref 0.5–1.9)
Lactic Acid, Venous: 4.1 mmol/L (ref 0.5–1.9)

## 2024-03-12 LAB — GLUCOSE, PLEURAL OR PERITONEAL FLUID: Glucose, Fluid: 222 mg/dL

## 2024-03-12 LAB — GRAM STAIN

## 2024-03-12 LAB — AMMONIA: Ammonia: 93 umol/L — ABNORMAL HIGH (ref 9–35)

## 2024-03-12 LAB — T4, FREE: Free T4: 1.2 ng/dL — ABNORMAL HIGH (ref 0.61–1.12)

## 2024-03-12 LAB — RESP PANEL BY RT-PCR (RSV, FLU A&B, COVID)  RVPGX2
Influenza A by PCR: NEGATIVE
Influenza B by PCR: NEGATIVE
Resp Syncytial Virus by PCR: NEGATIVE
SARS Coronavirus 2 by RT PCR: NEGATIVE

## 2024-03-12 LAB — PROTEIN, PLEURAL OR PERITONEAL FLUID: Total protein, fluid: <3 g/dL

## 2024-03-12 LAB — PROTIME-INR
INR: 2.4 — ABNORMAL HIGH (ref 0.8–1.2)
Prothrombin Time: 27 s — ABNORMAL HIGH (ref 11.4–15.2)

## 2024-03-12 LAB — LACTIC ACID, PLASMA
Lactic Acid, Venous: 2.6 mmol/L (ref 0.5–1.9)
Lactic Acid, Venous: 2.5 mmol/L (ref 0.5–1.9)

## 2024-03-12 LAB — MAGNESIUM: Magnesium: 1.9 mg/dL (ref 1.7–2.4)

## 2024-03-12 LAB — GLUCOSE, CAPILLARY
Glucose-Capillary: 192 mg/dL — ABNORMAL HIGH (ref 70–99)
Glucose-Capillary: 217 mg/dL — ABNORMAL HIGH (ref 70–99)

## 2024-03-12 LAB — LACTATE DEHYDROGENASE, PLEURAL OR PERITONEAL FLUID: LD, Fluid: <25 U/L — ABNORMAL HIGH (ref 3–23)

## 2024-03-12 LAB — BETA-HYDROXYBUTYRIC ACID: Beta-Hydroxybutyric Acid: 0.28 mmol/L — ABNORMAL HIGH (ref 0.05–0.27)

## 2024-03-12 LAB — PREPARE RBC (CROSSMATCH)

## 2024-03-12 LAB — TSH: TSH: 0.178 u[IU]/mL — ABNORMAL LOW (ref 0.350–4.500)

## 2024-03-12 MED ORDER — OXYCODONE HCL 5 MG PO TABS
5.0000 mg | ORAL_TABLET | Freq: Four times a day (QID) | ORAL | Status: AC | PRN
  Administered 2024-03-12 – 2024-03-13 (×3): 5 mg via ORAL
  Filled 2024-03-12 (×3): qty 1

## 2024-03-12 MED ORDER — LACTATED RINGERS IV BOLUS (SEPSIS)
2000.0000 mL | Freq: Once | INTRAVENOUS | Status: AC
  Administered 2024-03-12: 2000 mL via INTRAVENOUS

## 2024-03-12 MED ORDER — SODIUM CHLORIDE 0.9 % IV SOLN
2.0000 g | Freq: Three times a day (TID) | INTRAVENOUS | Status: DC
  Administered 2024-03-12 – 2024-03-14 (×7): 2 g via INTRAVENOUS
  Filled 2024-03-12 (×8): qty 12.5

## 2024-03-12 MED ORDER — METRONIDAZOLE 500 MG/100ML IV SOLN
500.0000 mg | Freq: Once | INTRAVENOUS | Status: AC
  Administered 2024-03-12: 500 mg via INTRAVENOUS
  Filled 2024-03-12: qty 100

## 2024-03-12 MED ORDER — ACETAMINOPHEN 325 MG PO TABS
650.0000 mg | ORAL_TABLET | Freq: Once | ORAL | Status: AC
  Administered 2024-03-12: 650 mg via ORAL
  Filled 2024-03-12: qty 2

## 2024-03-12 MED ORDER — ACETAMINOPHEN 650 MG RE SUPP: 650.0000 mg | Freq: Four times a day (QID) | RECTAL | Status: AC | PRN

## 2024-03-12 MED ORDER — TRIMETHOBENZAMIDE HCL 100 MG/ML IM SOLN
200.0000 mg | Freq: Three times a day (TID) | INTRAMUSCULAR | Status: AC | PRN
  Administered 2024-03-12 – 2024-03-13 (×2): 200 mg via INTRAMUSCULAR
  Filled 2024-03-12 (×3): qty 2

## 2024-03-12 MED ORDER — FUROSEMIDE 10 MG/ML IJ SOLN
20.0000 mg | Freq: Once | INTRAMUSCULAR | Status: AC
  Administered 2024-03-13: 20 mg via INTRAVENOUS
  Filled 2024-03-12: qty 2

## 2024-03-12 MED ORDER — SODIUM CHLORIDE 0.9% IV SOLUTION: Freq: Once | INTRAVENOUS | Status: DC

## 2024-03-12 MED ORDER — VANCOMYCIN HCL IN DEXTROSE 1-5 GM/200ML-% IV SOLN
1000.0000 mg | Freq: Two times a day (BID) | INTRAVENOUS | Status: DC
  Administered 2024-03-13 – 2024-03-14 (×3): 1000 mg via INTRAVENOUS
  Filled 2024-03-12 (×3): qty 200

## 2024-03-12 MED ORDER — SODIUM CHLORIDE 0.9 % IV SOLN
2.0000 g | Freq: Once | INTRAVENOUS | Status: AC
  Administered 2024-03-12: 2 g via INTRAVENOUS
  Filled 2024-03-12: qty 12.5

## 2024-03-12 MED ORDER — MONTELUKAST SODIUM 10 MG PO TABS
10.0000 mg | ORAL_TABLET | Freq: Every day | ORAL | Status: DC
  Administered 2024-03-12 – 2024-03-13 (×2): 10 mg via ORAL
  Filled 2024-03-12 (×2): qty 1

## 2024-03-12 MED ORDER — FLUOXETINE HCL 20 MG PO CAPS
40.0000 mg | ORAL_CAPSULE | Freq: Two times a day (BID) | ORAL | Status: DC
  Administered 2024-03-12 – 2024-03-14 (×4): 40 mg via ORAL
  Filled 2024-03-12 (×5): qty 2

## 2024-03-12 MED ORDER — MAGNESIUM SULFATE 2 GM/50ML IV SOLN
2.0000 g | Freq: Once | INTRAVENOUS | Status: AC
  Administered 2024-03-12: 2 g via INTRAVENOUS
  Filled 2024-03-12: qty 50

## 2024-03-12 MED ORDER — ALBUMIN HUMAN 25 % IV SOLN: 12.5000 g | Freq: Once | INTRAVENOUS | Status: DC

## 2024-03-12 MED ORDER — LACTULOSE ENEMA
300.0000 mL | Freq: Once | ORAL | Status: DC
  Filled 2024-03-12: qty 300

## 2024-03-12 MED ORDER — LORAZEPAM 2 MG/ML IJ SOLN
1.0000 mg | Freq: Once | INTRAMUSCULAR | Status: AC
  Administered 2024-03-12: 1 mg via INTRAVENOUS
  Filled 2024-03-12: qty 1

## 2024-03-12 MED ORDER — RIFAXIMIN 550 MG PO TABS
550.0000 mg | ORAL_TABLET | Freq: Two times a day (BID) | ORAL | Status: DC
  Administered 2024-03-12 – 2024-03-14 (×5): 550 mg via ORAL
  Filled 2024-03-12 (×5): qty 1

## 2024-03-12 MED ORDER — LACTULOSE 10 GM/15ML PO SOLN
30.0000 g | Freq: Three times a day (TID) | ORAL | Status: DC
  Administered 2024-03-12: 30 g via ORAL
  Filled 2024-03-12: qty 60

## 2024-03-12 MED ORDER — VANCOMYCIN HCL 2000 MG/400ML IV SOLN
2000.0000 mg | Freq: Once | INTRAVENOUS | Status: AC
  Administered 2024-03-12: 2000 mg via INTRAVENOUS
  Filled 2024-03-12: qty 400

## 2024-03-12 MED ORDER — ACETAMINOPHEN 325 MG PO TABS
650.0000 mg | ORAL_TABLET | Freq: Four times a day (QID) | ORAL | Status: AC | PRN
  Administered 2024-03-12 – 2024-03-13 (×3): 650 mg via ORAL
  Filled 2024-03-12 (×3): qty 2

## 2024-03-12 MED ORDER — VANCOMYCIN HCL IN DEXTROSE 1-5 GM/200ML-% IV SOLN
1000.0000 mg | Freq: Once | INTRAVENOUS | Status: DC
  Filled 2024-03-12: qty 200

## 2024-03-12 MED ORDER — LIDOCAINE-EPINEPHRINE 1 %-1:100000 IJ SOLN
20.0000 mL | Freq: Once | INTRAMUSCULAR | Status: AC
  Administered 2024-03-12: 20 mL via INTRADERMAL

## 2024-03-12 MED ORDER — ROPINIROLE HCL 1 MG PO TABS
1.0000 mg | ORAL_TABLET | Freq: Every day | ORAL | Status: DC
  Administered 2024-03-12 – 2024-03-13 (×2): 1 mg via ORAL
  Filled 2024-03-12 (×2): qty 1

## 2024-03-12 MED ORDER — ALBUMIN HUMAN 25 % IV SOLN
INTRAVENOUS | Status: AC
  Filled 2024-03-12: qty 200

## 2024-03-12 MED ORDER — SODIUM CHLORIDE 0.9 % IV SOLN
50.0000 ug/h | INTRAVENOUS | Status: DC
  Administered 2024-03-12 – 2024-03-14 (×5): 50 ug/h via INTRAVENOUS
  Filled 2024-03-12 (×7): qty 1

## 2024-03-12 MED ORDER — LIDOCAINE-EPINEPHRINE 1 %-1:100000 IJ SOLN
INTRAMUSCULAR | Status: AC
  Filled 2024-03-12: qty 1

## 2024-03-12 MED ORDER — LACTATED RINGERS IV SOLN: INTRAVENOUS | Status: AC

## 2024-03-12 MED ORDER — ONDANSETRON HCL 4 MG/2ML IJ SOLN
4.0000 mg | Freq: Once | INTRAMUSCULAR | Status: AC
  Administered 2024-03-12: 4 mg via INTRAVENOUS
  Filled 2024-03-12: qty 2

## 2024-03-12 MED ORDER — FENTANYL CITRATE (PF) 50 MCG/ML IJ SOSY
50.0000 ug | PREFILLED_SYRINGE | Freq: Once | INTRAMUSCULAR | Status: DC
  Filled 2024-03-12: qty 1

## 2024-03-12 MED ORDER — PANTOPRAZOLE SODIUM 40 MG IV SOLR
40.0000 mg | Freq: Once | INTRAVENOUS | Status: AC
  Administered 2024-03-12: 40 mg via INTRAVENOUS
  Filled 2024-03-12: qty 10

## 2024-03-12 MED ORDER — METRONIDAZOLE 500 MG/100ML IV SOLN: 500.0000 mg | Freq: Two times a day (BID) | INTRAVENOUS | Status: DC

## 2024-03-12 MED ORDER — SPIRONOLACTONE 25 MG PO TABS
25.0000 mg | ORAL_TABLET | Freq: Every day | ORAL | Status: DC
  Administered 2024-03-13 – 2024-03-14 (×2): 25 mg via ORAL
  Filled 2024-03-12 (×2): qty 1

## 2024-03-12 MED ORDER — INSULIN ASPART 100 UNIT/ML IJ SOLN
0.0000 [IU] | INTRAMUSCULAR | Status: DC
  Administered 2024-03-12: 3 [IU] via SUBCUTANEOUS
  Administered 2024-03-12 – 2024-03-13 (×2): 2 [IU] via SUBCUTANEOUS
  Administered 2024-03-13: 1 [IU] via SUBCUTANEOUS
  Administered 2024-03-13 (×2): 2 [IU] via SUBCUTANEOUS
  Administered 2024-03-13: 3 [IU] via SUBCUTANEOUS
  Administered 2024-03-13: 2 [IU] via SUBCUTANEOUS
  Administered 2024-03-14 (×2): 3 [IU] via SUBCUTANEOUS
  Administered 2024-03-14 (×2): 2 [IU] via SUBCUTANEOUS
  Administered 2024-03-14 (×2): 3 [IU] via SUBCUTANEOUS
  Filled 2024-03-12 (×2): qty 2
  Filled 2024-03-12 (×2): qty 3
  Filled 2024-03-12: qty 2
  Filled 2024-03-12: qty 3
  Filled 2024-03-12 (×2): qty 2
  Filled 2024-03-12 (×3): qty 3
  Filled 2024-03-12: qty 2
  Filled 2024-03-12: qty 3
  Filled 2024-03-12: qty 2

## 2024-03-12 MED ORDER — SPIRONOLACTONE 25 MG PO TABS
50.0000 mg | ORAL_TABLET | Freq: Every day | ORAL | Status: DC
  Administered 2024-03-12: 50 mg via ORAL
  Filled 2024-03-12: qty 4

## 2024-03-12 MED ORDER — ALBUMIN HUMAN 25 % IV SOLN
12.5000 g | Freq: Once | INTRAVENOUS | Status: AC
  Administered 2024-03-12: 50 g via INTRAVENOUS

## 2024-03-12 MED ORDER — ALBUMIN HUMAN 25 % IV SOLN
25.0000 g | Freq: Three times a day (TID) | INTRAVENOUS | Status: DC
  Administered 2024-03-12 – 2024-03-13 (×2): 25 g via INTRAVENOUS
  Filled 2024-03-12 (×2): qty 100

## 2024-03-12 MED ORDER — METRONIDAZOLE 500 MG/100ML IV SOLN
500.0000 mg | Freq: Two times a day (BID) | INTRAVENOUS | Status: DC
  Administered 2024-03-12 – 2024-03-14 (×4): 500 mg via INTRAVENOUS
  Filled 2024-03-12 (×4): qty 100

## 2024-03-12 MED ORDER — LACTATED RINGERS IV BOLUS
1000.0000 mL | Freq: Once | INTRAVENOUS | Status: AC
  Administered 2024-03-12: 1000 mL via INTRAVENOUS

## 2024-03-12 MED ORDER — PANTOPRAZOLE SODIUM 40 MG IV SOLR
40.0000 mg | Freq: Two times a day (BID) | INTRAVENOUS | Status: DC
  Administered 2024-03-12 – 2024-03-14 (×4): 40 mg via INTRAVENOUS
  Filled 2024-03-12 (×4): qty 10

## 2024-03-12 NOTE — Hospital Course (Addendum)
 Laurie Sutton

## 2024-03-12 NOTE — Procedures (Signed)
  PROCEDURE SUMMARY:  Successful ultrasound guided paracentesis from the left lateral quadrant.  Yielded 8 of straw colored fluid.  No immediate complications.  The patient tolerated the procedure well.   Specimen is held in IR if team wants fluids sent for labs.  EBL <  2 mL  Patient is on the transplant list at [facility].CLT

## 2024-03-12 NOTE — Telephone Encounter (Signed)
 On Call Provider responded to [facility] message

## 2024-03-12 NOTE — ED Provider Notes (Signed)
 [place] EMERGENCY DEPARTMENT AT [facility] Provider Note   CSN: [id number] Arrival date & time: 03/12/24  1123     Patient presents with: Code Sepsis   Laurie Sutton is a 43 y.o. female with a history of end stage live disease, who presents to the ED with a fever, tachycardia, and malaise. The symptoms started after she returned home from her scheduled paracentesis where she had 8 L removed. Patient stated that she started to feel ill when she returned home, and symptoms have been progressively worsening. The patient states that she had a temperature of 101F at home, and she also has a headache. The patient reports no chest pain, shortness of breath, nausea, vomiting, diarrhea.  The patient states that for the last few days people in her house have been battling a viral GI illness.  The patient is ill-appearing.    HPI     Prior to Admission medications   Medication Sig Start Date End Date Taking? Authorizing Provider  FLUoxetine  (PROZAC ) 40 MG capsule Take 1 capsule (40 mg total) by mouth 2 (two) times daily. 01/03/24  Yes [name], MD  lactulose  (CHRONULAC ) 10 GM/15ML solution Take 45 mLs (30 g total) by mouth 3 (three) times daily. 02/29/24  Yes Dino Antu, MD  rifaximin  (XIFAXAN ) 550 MG TABS tablet Take 550 mg by mouth 2 (two) times daily.   Yes [provider]  rOPINIRole  (REQUIP ) 1 MG tablet Take 1 tablet (1 mg total) by mouth at bedtime. 01/03/24  Yes [name], MD  albumin  human 25 % bottle Inject 100 mLs (25 g total) into the vein every 6 (six) hours. 03/13/24   Elgergawy, Brayton GORMAN, MD  ceFEPIme  2 g in sodium chloride  0.9 % 100 mL Inject 2 g into the vein every 8 (eight) hours. 03/13/24   Elgergawy, Brayton GORMAN, MD  insulin  aspart (NOVOLOG ) 100 UNIT/ML injection Inject 0-9 Units into the skin every 4 (four) hours. 03/13/24   Elgergawy, Brayton GORMAN, MD  insulin  glargine-yfgn (SEMGLEE ) 100 UNIT/ML injection Inject 0.08 mLs (8 Units total) into the  skin daily. 03/13/24   Elgergawy, Brayton GORMAN, MD  metroNIDAZOLE  (FLAGYL ) 500 MG/100ML Inject 100 mLs (500 mg total) into the vein every 12 (twelve) hours. 03/13/24   Elgergawy, Brayton GORMAN, MD  midodrine  (PROAMATINE ) 10 MG tablet Take 1 tablet (10 mg total) by mouth 3 (three) times daily with meals. 03/13/24   Elgergawy, Brayton GORMAN, MD  octreotide  500 mcg in sodium chloride  0.9 % 250 mL Inject 50 mcg/hr into the vein continuous. 03/13/24   Elgergawy, Brayton GORMAN, MD  pantoprazole  (PROTONIX ) 40 MG injection Inject 40 mg into the vein every 12 (twelve) hours. 03/13/24   Elgergawy, Brayton GORMAN, MD  spironolactone  (ALDACTONE ) 25 MG tablet Take 1 tablet (25 mg total) by mouth daily. 03/14/24   Elgergawy, Brayton GORMAN, MD    Allergies: Morphine , Penicillins, Vitamin k  and related, Latex, Vancomycin , Amoxicillin, Azithromycin , Dulaglutide , Empagliflozin -metformin  hcl er, Januvia  [sitagliptin ], and Metformin     Review of Systems  Constitutional:  Positive for fatigue and fever.    Updated Vital Signs BP 121/63   Pulse 92   Temp 100 F (37.8 C)   Resp (!) 35   Ht 5' 6 (1.676 m)   Wt 83.5 kg   LMP 11/13/2020 (Exact Date)   SpO2 93%   BMI 29.71 kg/m   Physical Exam Vitals and nursing note reviewed.  Constitutional:      General: She is not in  acute distress.    Appearance: Normal appearance. She is ill-appearing.  HENT:     Head: Normocephalic and atraumatic.  Eyes:     Extraocular Movements: Extraocular movements intact.     Conjunctiva/sclera: Conjunctivae normal.     Pupils: Pupils are equal, round, and reactive to light.  Cardiovascular:     Rate and Rhythm: Regular rhythm. Tachycardia present.     Pulses: Normal pulses.  Pulmonary:     Effort: Pulmonary effort is normal. No respiratory distress.     Breath sounds: Normal breath sounds.     Comments: Patient has no difficulty speaking in complete sentences.  Abdominal:     General: Abdomen is flat. There is no distension.     Palpations: Abdomen is  soft.     Tenderness: There is generalized abdominal tenderness. There is no right CVA tenderness, left CVA tenderness, guarding or rebound.  Musculoskeletal:        General: Normal range of motion.     Cervical back: Normal range of motion.  Skin:    General: Skin is warm and dry.     Capillary Refill: Capillary refill takes less than 2 seconds.     Comments: Tactile fever  Neurological:     General: No focal deficit present.     Mental Status: She is alert. Mental status is at baseline.     Comments: Patient appears ill-appearing and fatigued, however neuro-exam otherwise unremarkable.  Psychiatric:        Mood and Affect: Mood normal.     (all labs ordered are listed, but only abnormal results are displayed) Labs Reviewed  CBC WITH DIFFERENTIAL/PLATELET - Abnormal; Notable for the following components:      Result Value   WBC 1.7 (*)    RBC 2.86 (*)    Hemoglobin 8.0 (*)    HCT 25.4 (*)    RDW 17.9 (*)    Platelets 57 (*)    Neutro Abs 1.4 (*)    Lymphs Abs 0.1 (*)    All other components within normal limits  URINALYSIS, W/ REFLEX TO CULTURE (INFECTION SUSPECTED) - Abnormal; Notable for the following components:   Color, Urine AMBER (*)    APPearance HAZY (*)    Glucose, UA 50 (*)    Hgb urine dipstick MODERATE (*)    Protein, ur >=300 (*)    Bacteria, UA RARE (*)    All other components within normal limits  LACTATE DEHYDROGENASE, PLEURAL OR PERITONEAL FLUID - Abnormal; Notable for the following components:   LD, Fluid <25 (*)    All other components within normal limits  BODY FLUID CELL COUNT WITH DIFFERENTIAL - Abnormal; Notable for the following components:   Appearance, Fluid HAZY (*)    Monocyte-Macrophage-Serous Fluid 17 (*)    All other components within normal limits  COMPREHENSIVE METABOLIC PANEL WITH GFR - Abnormal; Notable for the following components:   Sodium 131 (*)    Chloride 97 (*)    CO2 21 (*)    Glucose, Bld 217 (*)    Calcium  8.4 (*)     Total Protein 6.0 (*)    Albumin  3.3 (*)    AST 46 (*)    Total Bilirubin 5.3 (*)    All other components within normal limits  PROTIME-INR - Abnormal; Notable for the following components:   Prothrombin Time 27.0 (*)    INR 2.4 (*)    All other components within normal limits  AMMONIA - Abnormal; Notable for the  following components:   Ammonia 93 (*)    All other components within normal limits  T4, FREE - Abnormal; Notable for the following components:   Free T4 1.20 (*)    All other components within normal limits  TSH - Abnormal; Notable for the following components:   TSH 0.178 (*)    All other components within normal limits  LACTIC ACID, PLASMA - Abnormal; Notable for the following components:   Lactic Acid, Venous 2.6 (*)    All other components within normal limits  HEMOGLOBIN AND HEMATOCRIT, BLOOD - Abnormal; Notable for the following components:   Hemoglobin 6.9 (*)    HCT 22.0 (*)    All other components within normal limits  COMPREHENSIVE METABOLIC PANEL WITH GFR - Abnormal; Notable for the following components:   Sodium 131 (*)    Glucose, Bld 207 (*)    Calcium  8.2 (*)    Total Protein 5.8 (*)    Albumin  3.2 (*)    AST 48 (*)    Total Bilirubin 9.4 (*)    All other components within normal limits  CBC WITH DIFFERENTIAL/PLATELET - Abnormal; Notable for the following components:   WBC 2.1 (*)    RBC 2.70 (*)    Hemoglobin 7.6 (*)    HCT 23.6 (*)    RDW 18.6 (*)    Platelets 50 (*)    Neutro Abs 1.6 (*)    Lymphs Abs 0.2 (*)    All other components within normal limits  PROTIME-INR - Abnormal; Notable for the following components:   Prothrombin Time 27.9 (*)    INR 2.5 (*)    All other components within normal limits  GLUCOSE, CAPILLARY - Abnormal; Notable for the following components:   Glucose-Capillary 192 (*)    All other components within normal limits  LACTIC ACID, PLASMA - Abnormal; Notable for the following components:   Lactic Acid, Venous 2.5  (*)    All other components within normal limits  BETA-HYDROXYBUTYRIC ACID - Abnormal; Notable for the following components:   Beta-Hydroxybutyric Acid 0.28 (*)    All other components within normal limits  LACTIC ACID, PLASMA - Abnormal; Notable for the following components:   Lactic Acid, Venous 2.8 (*)    All other components within normal limits  GLUCOSE, CAPILLARY - Abnormal; Notable for the following components:   Glucose-Capillary 217 (*)    All other components within normal limits  GLUCOSE, CAPILLARY - Abnormal; Notable for the following components:   Glucose-Capillary 196 (*)    All other components within normal limits  GLUCOSE, CAPILLARY - Abnormal; Notable for the following components:   Glucose-Capillary 209 (*)    All other components within normal limits  GLUCOSE, CAPILLARY - Abnormal; Notable for the following components:   Glucose-Capillary 167 (*)    All other components within normal limits  GLUCOSE, CAPILLARY - Abnormal; Notable for the following components:   Glucose-Capillary 150 (*)    All other components within normal limits  GLUCOSE, CAPILLARY - Abnormal; Notable for the following components:   Glucose-Capillary 196 (*)    All other components within normal limits  CBC - Abnormal; Notable for the following components:   WBC 3.1 (*)    RBC 2.56 (*)    Hemoglobin 7.1 (*)    HCT 23.1 (*)    RDW 19.3 (*)    Platelets 42 (*)    All other components within normal limits  COMPREHENSIVE METABOLIC PANEL WITH GFR - Abnormal; Notable for the  following components:   Sodium 132 (*)    Potassium 3.3 (*)    Chloride 97 (*)    Glucose, Bld 217 (*)    Calcium  8.6 (*)    Total Protein 5.9 (*)    AST 49 (*)    Total Bilirubin 6.3 (*)    All other components within normal limits  AMMONIA - Abnormal; Notable for the following components:   Ammonia 47 (*)    All other components within normal limits  PROTIME-INR - Abnormal; Notable for the following components:    Prothrombin Time 31.6 (*)    INR 2.9 (*)    All other components within normal limits  GLUCOSE, CAPILLARY - Abnormal; Notable for the following components:   Glucose-Capillary 185 (*)    All other components within normal limits  GLUCOSE, CAPILLARY - Abnormal; Notable for the following components:   Glucose-Capillary 186 (*)    All other components within normal limits  GLUCOSE, CAPILLARY - Abnormal; Notable for the following components:   Glucose-Capillary 204 (*)    All other components within normal limits  GLUCOSE, CAPILLARY - Abnormal; Notable for the following components:   Glucose-Capillary 176 (*)    All other components within normal limits  GLUCOSE, CAPILLARY - Abnormal; Notable for the following components:   Glucose-Capillary 205 (*)    All other components within normal limits  GLUCOSE, CAPILLARY - Abnormal; Notable for the following components:   Glucose-Capillary 227 (*)    All other components within normal limits  GLUCOSE, CAPILLARY - Abnormal; Notable for the following components:   Glucose-Capillary 213 (*)    All other components within normal limits  I-STAT CG4 LACTIC ACID, ED - Abnormal; Notable for the following components:   Lactic Acid, Venous 5.5 (*)    All other components within normal limits  I-STAT CG4 LACTIC ACID, ED - Abnormal; Notable for the following components:   Lactic Acid, Venous 4.1 (*)    All other components within normal limits  CULTURE, BLOOD (ROUTINE X 2)  CULTURE, BLOOD (ROUTINE X 2)  RESP PANEL BY RT-PCR (RSV, FLU A&B, COVID)  RVPGX2  CULTURE, BODY FLUID W GRAM STAIN -BOTTLE  GRAM STAIN  RESPIRATORY PANEL BY PCR  GLUCOSE, PLEURAL OR PERITONEAL FLUID  PROTEIN, PLEURAL OR PERITONEAL FLUID  ALBUMIN , PLEURAL OR PERITONEAL FLUID   MAGNESIUM   PHOSPHORUS  CORTISOL-AM, BLOOD  MAGNESIUM   LACTIC ACID, PLASMA  PATHOLOGIST SMEAR REVIEW  PROCALCITONIN  TYPE AND SCREEN  PREPARE RBC (CROSSMATCH)  PREPARE RBC (CROSSMATCH)  BLOOD  TRANSFUSION REPORT - SCANNED   Narrative:    Ordered by an unspecified provider.    EKG: EKG Interpretation Date/Time:  Monday March 12 2024 12:57:09 EST Ventricular Rate:  158 PR Interval:  122 QRS Duration:  91 QT Interval:  400 QTC Calculation: 473 R Axis:   2  Text Interpretation: Junctional rhythm Ventricular tachycardia, unsustained Repol abnrm suggests ischemia, lateral leads Confirmed by Dreama Longs ([id number]) on 03/12/2024 1:50:07 PM  Radiology: No results found.    Procedures   Medications Ordered in the ED  lactated ringers  infusion (0 mLs Intravenous Stopping previously hung infusion 03/13/24 0644)  lactulose  (CHRONULAC ) 10 GM/15ML solution 30 g (30 g Oral Not Given 03/14/24 0903)  lidocaine -EPINEPHrine  (XYLOCAINE  W/EPI) 1 %-1:100000 (with pres) injection 20 mL (20 mLs Intradermal Given 03/12/24 1014)  albumin  human 25 % solution 12.5 g (0 g Intravenous Stopped 03/12/24 1308)  lactated ringers  bolus 2,000 mL (0 mLs Intravenous Stopped 03/12/24 1308)  ceFEPIme  (MAXIPIME ) 2  g in sodium chloride  0.9 % 100 mL IVPB (0 g Intravenous Stopped 03/12/24 1236)  metroNIDAZOLE  (FLAGYL ) IVPB 500 mg (0 mg Intravenous Stopped 03/12/24 1306)  vancomycin  (VANCOREADY) IVPB 2000 mg/400 mL (0 mg Intravenous Stopped 03/12/24 1516)  ondansetron  (ZOFRAN ) injection 4 mg (4 mg Intravenous Given 03/12/24 1215)  magnesium  sulfate IVPB 2 g 50 mL (0 g Intravenous Stopped 03/12/24 1359)  LORazepam  (ATIVAN ) injection 1 mg (1 mg Intravenous Given 03/12/24 1355)  lactated ringers  bolus 1,000 mL (0 mLs Intravenous Stopped 03/12/24 1522)  pantoprazole  (PROTONIX ) injection 40 mg (40 mg Intravenous Given 03/12/24 1416)  acetaminophen  (TYLENOL ) tablet 650 mg (650 mg Oral Given 03/12/24 1414)  acetaminophen  (TYLENOL ) tablet 650 mg (650 mg Oral Given 03/13/24 1417)    Or  acetaminophen  (TYLENOL ) suppository 650 mg ( Rectal See Alternative 03/13/24 1417)  oxyCODONE  (Oxy IR/ROXICODONE ) immediate release tablet 5 mg  (5 mg Oral Given 03/13/24 1418)  trimethobenzamide  (TIGAN ) injection 200 mg (200 mg Intramuscular Given 03/13/24 0754)  furosemide  (LASIX ) injection 20 mg (20 mg Intravenous Given 03/13/24 0034)  lactulose  (CHRONULAC ) 10 GM/15ML solution 20 g (20 g Oral Given 03/13/24 1603)  acetaminophen  (TYLENOL ) tablet 650 mg (650 mg Oral Given 03/14/24 0840)  oxyCODONE  (Oxy IR/ROXICODONE ) immediate release tablet 2.5 mg (2.5 mg Oral Given 03/13/24 2048)  oxyCODONE  (Oxy IR/ROXICODONE ) immediate release tablet 2.5 mg (2.5 mg Oral Given 03/14/24 0416)  potassium chloride  SA (KLOR-CON  M) CR tablet 40 mEq (40 mEq Oral Given 03/14/24 0842)  prochlorperazine  (COMPAZINE ) injection 2.5 mg (2.5 mg Intravenous Given 03/14/24 2054)                                 Medical Decision Making Amount and/or Complexity of Data Reviewed Labs: ordered. Decision-making details documented in ED Course.  Risk Prescription drug management.   Patient presents to the ED for: Sepsis This involves an extensive number of treatment options and is a complaint that carries with it a high risk of complications  Differential diagnosis includes: Infectious etiology IR complications Co-morbid conditions: Liver cirrhosis, hepatic encephalopathy, portal hypertension, type 2 diabetes  Additional history/records obtained and reviewed: Additional history obtained from  outside medical records External records from outside source obtained and reviewed including medical records regarding patient currently being on liver transplant list, and IR note/record  Clinical Course as of 03/17/24 0618  Mon Mar 12, 2024  1156 Temp(!): 103.1 F (39.5 C) Patient febrile, tachycardic, ill-appearing. [ML]  1156 IR paracentesis pulled off 8 L of fluid before sending her down to the emergency department for concerns of sepsis. [ML]  1200 DG Chest Port 1 View if patient is in a treatment room. Low lung volumes [ML]  1409 I-Stat Lactic Acid, ED(!!) Lactic acid  4.1 [ML]  1410 Comprehensive metabolic panel with GFR(!) CMP fairly consistent with baseline [ML]  1410 Resp panel by RT-PCR (RSV, Flu A&B, Covid) Anterior Nasal Swab Negative respiratory panel [ML]  1410 Lactate dehydrogenase (pleural or peritoneal fluid)(!) LD, fluid <25 [ML]  1411 CBC with Differential(!) [ML]  1411 CBC with Differential(!) CBC fairly consistent with baseline [ML]  1411 ECG Heart Rate(!): 108 Patient has remained tachycardic, ill-appearing. [ML]  1411 Patient given Ativan  for nausea/pain. [ML]  Sat Mar 17, 2024  0618 DG Chest [place] 1 View if patient is in a treatment room. [ML]    Clinical Course User Index [ML] Willma Duwaine CROME, [place]    Data Reviewed / Actions Taken:  Labs ordered/reviewed with my independent interpretation in ED course above. Imaging ordered/reviewed with my independent interpretation in ED course above. I agree with the radiologists interpretation.  EKG ordered/reviewed with my independent interpretation in ED course above. The patient required continuous cardiac monitoring during the ED stay. Key findings for the patient were reviewed with the attending physician, and ongoing clinical collaboration was maintained throughout the visit  Management / Treatments: See ED course above for medications, treatments administered, and clinical rationale.   Reevaluation of the patient after these medicines showed that the patient improved I have reviewed the patients home medicines and have made adjustments as needed  ED Course / Reassessments: Problem List: sepsis 42 year old female presented for concern for sepsis. Initial assessment included history, physical exam, and review of prior medical records. Patients physical exam included revealed patient was feverish, tachycardic, and complaining of headache and general malaise. Laboratory studies, imaging, and other ancillary studies were obtained and showed infectious etiology. Pain and symptoms were addressed  during the visit. Vital signs were obtained and monitored, and the patient remained stable.  The hospitalist was consulted and patient was admitted for further evaluation and care of sepsis.  Patient response: unchanged Serial reassessments performed: Yes    Consultations:  Hospitalist Consult recommendations incorporated into plan: Hospitalist was contacted shortly after the patient's emergency department arrival for admittance, further evaluation and care of worsening sepsis.  Disposition: Disposition: Admission Rationale for disposition: Due to patient's worsening sepsis, patient was admitted for further evaluation and care. The disposition plan and rationale were discussed with the patient at the bedside, all questions were addressed, and the patient demonstrated understanding.  This note was produced using Electronics Engineer. While I have reviewed and verified all clinical information, transcription errors may remain.      Final diagnoses:  Other ascites        Willma Duwaine CROME, [place] 03/17/24 ZOILA    Dreama Longs, MD 03/17/24 [phone]

## 2024-03-12 NOTE — Consult Note (Addendum)
 Referring Provider: Hays Morrison, CRNP Primary Care Physician:  Default, Provider, MD Primary Gastroenterologist:  Dr. Legrand  Reason for Consultation:  Fever, lethargy, cirrhosis   Attending physician's note  I personally saw the patient and performed a substantive portion of the medical decision making process for this encounter (including a complete performance of the key components : MDM, Hx and Exam), in conjunction with the APP.  I agree with the APP's note, impression, and  the management plan for the number and complexity of problems addressed at the encounter for the patient and take responsibility for that plan with its inherent risk of complications, morbidity, or mortality with additional input as follows.     43 year old female with decompensated MASH cirrhosis, refractory ascites, currently on transplant list MELD 3.0: 26 at 03/12/2024 12:21 PM MELD-Na: 26 at 03/12/2024 12:21 PM  Ascites fluid negative for SBP Follow-up blood culture and urine culture to exclude underlying infection causing liver decompensation No signs of GI bleed Continue lactulose  and Xifaxan  Continue broad-spectrum antibiotics  INR elevated compared to baseline, will continue to monitor MELD.  If has worsening MELD score, low threshold to transfer to [facility] for transplant eval  GI will continue to follow along  The patient was provided an opportunity to ask questions and all were answered. The patient agreed with the plan and demonstrated an understanding of the instructions.  LOIS Wilkie Mcgee , MD [phone]     HPI: AMAYA BLAKEMAN is a 43 y.o. female well-known to our practice for history of decompensated cirrhosis.  She has had multiple admissions for refractory ascites.  She has chronic anemia.  Is on liver transplant list with [facility], next in line.    She gets regular paracentesis, every 5-7 days.  Had one this AM with 8 Liters removed, negative for SBP.  Says that she started feeling bad  at 3 AM today with headache.  Complaining of headache now and nausea.  Some upper abdominal pain.  Has been febrile, sent here directly from paracentesis.  Has an elevated lactic acid.  Chest x-ray, UA, blood cultures, head CT have been ordered and she has been started on abx.  Hgb is stable and she denies and evidence of GI bleeding.  Was able to provide a good history to me today.   02/16/24 EGD for hematemesis - Grade II esophageal varices with no bleeding and no stigmata of recent bleeding. Varices are small in size.  - Portal hypertensive gastropathy.  - A single gastric cardia polyp with mild inflammatory change (benign appearing, most consistent with small hyperplastic polyp). Resection would be challenging given location for this low risk, small polyp.  - Nodular antral gastritis without bleeding.  - Normal examined duodenum.  - No evidence of blood in the UGI tract on this EGD. Cannot exclude recent bleeding from portal gastropathy or inflamed cardia polyp in setting of coagulopathy and low platelets.  - No specimens collected.   August 2025 flexible sigmoidoscopy for rectal bleeding - The rectum, sigmoid colon and descending colon are normal. Fecal matter in descending colon nonbloody and non-melenic.  - Internal hemorrhoids. This is the source of intermittent rectal bleeding.  - No specimens collected.  Past Medical History:  Diagnosis Date   ADHD    Allergy    Anemia    IRON TRANSFUSION 07-2020 NONE SINCE   Anxiety    Back pain    COVID 08/12/2019   ALL SYMPTOMS REOLVED PER PT   Depression    Diabetic  neuropathy (HCC) 11/11/2020   FEET   dm type 2    sees Dr. Odella Jacobson at [facility]   Fatty liver    GERD (gastroesophageal reflux disease)    History of kidney stones    Joint pain    Menorrhagia 11/11/2020   Migraines    Murmur, cardiac    FAINT NO CARDIOLOGIST   Other fatigue    Pneumonia 08/12/2019   COVID PNEUMONIA ALL SYMPTOMS RESOLVED PER PT    Sepsis (HCC) 10/04/2023   Shortness of breath on exertion     Past Surgical History:  Procedure Laterality Date   CESAREAN SECTION  12/13/2006   COLONOSCOPY WITH PROPOFOL  N/A 08/02/2023   Procedure: COLONOSCOPY WITH PROPOFOL ;  Surgeon: Legrand Victory LITTIE DOUGLAS, MD;  Location: [facility];  Service: Gastroenterology;  Laterality: N/A;   ESOPHAGOGASTRODUODENOSCOPY N/A 02/16/2024   Procedure: EGD (ESOPHAGOGASTRODUODENOSCOPY);  Surgeon: Albertus Gordy HERO, MD;  Location: [facility] END[facility]COPY;  Service: Gastroenterology;  Laterality: N/A;   ESOPHAGOGASTRODUODENOSCOPY (EGD) WITH PROPOFOL  N/A 08/02/2023   Procedure: ESOPHAGOGASTRODUODENOSCOPY (EGD) WITH PROPOFOL ;  Surgeon: Legrand Victory LITTIE DOUGLAS, MD;  Location: [facility];  Service: Gastroenterology;  Laterality: N/A;   EXTRACORPOREAL SHOCK WAVE LITHOTRIPSY  2018   FLEXIBLE SIGMOIDOSCOPY N/A 11/20/2023   Procedure: KINGSTON SIDE;  Surgeon: Albertus Gordy HERO, MD;  Location: [facility] END[facility];  Service: Gastroenterology;  Laterality: N/A;   FOOT SURGERY Right 10/02/2020   RIGHT FOOT HEEL AND TOES, SURGICAL CENTER OFF [address]   IR PARACENTESIS  07/06/2023   IR PARACENTESIS  08/18/2023   IR PARACENTESIS  10/05/2023   IR PARACENTESIS  10/11/2023   IR PARACENTESIS  10/20/2023   IR PARACENTESIS  11/08/2023   IR PARACENTESIS  11/28/2023   IR PARACENTESIS  12/09/2023   IR PARACENTESIS  12/15/2023   IR PARACENTESIS  12/16/2023   IR PARACENTESIS  12/23/2023   IR PARACENTESIS  12/30/2023   IR PARACENTESIS  01/06/2024   IR PARACENTESIS  01/06/2024   IR PARACENTESIS  01/10/2024   IR PARACENTESIS  01/13/2024   IR PARACENTESIS  01/20/2024   IR PARACENTESIS  01/27/2024   IR PARACENTESIS  02/01/2024   IR PARACENTESIS  02/03/2024   IR PARACENTESIS  02/08/2024   IR PARACENTESIS  02/15/2024   IR PARACENTESIS  02/20/2024   IR PARACENTESIS  02/27/2024   IR PARACENTESIS  02/29/2024   IR PARACENTESIS  03/06/2024   IR PARACENTESIS  03/12/2024   IR TRANSCATHETER BX  07/26/2023   IR US  GUIDE VASC  ACCESS RIGHT  07/26/2023   IR VENOGRAM HEPATIC W HEMODYNAMIC EVALUATION  07/26/2023   LAPAROSCOPIC VAGINAL HYSTERECTOMY WITH SALPINGECTOMY Bilateral 11/17/2020   Procedure: LAPAROSCOPIC ASSISTED VAGINAL HYSTERECTOMY WITH BILATERAL SALPINGECTOMY;  Surgeon: Dannielle Bouchard, DO;  Location: [facility];  Service: Gynecology;  Laterality: Bilateral;   POLYPECTOMY  08/02/2023   Procedure: POLYPECTOMY, INTESTINE;  Surgeon: Legrand Victory LITTIE DOUGLAS, MD;  Location: [facility];  Service: Gastroenterology;;    Prior to Admission medications   Medication Sig Start Date End Date Taking? Authorizing Provider  baclofen (LIORESAL) 10 MG tablet Take 10 mg by mouth once a week. After paracentesis 02/10/24 02/09/25  [provider]  clotrimazole -betamethasone  (LOTRISONE ) cream Apply 1 Application topically daily as needed (rash).    [provider]  Continuous Glucose Sensor (DEXCOM G7 SENSOR) MISC Use 1 sensor for continuous glucose monitoring every 10 days for 30 days 05/25/23   Jacobson Odella, MD  ergocalciferol  (VITAMIN D2) 1.25 MG (50000 UT) capsule Take 50,000 Units by  mouth once a week. Take weekly on Thursday    [provider]  FLUoxetine  (PROZAC ) 40 MG capsule Take 1 capsule (40 mg total) by mouth 2 (two) times daily. 01/03/24   [name], MD  insulin  regular human CONCENTRATED (HUMULIN  R) 500 UNIT/ML injection Inject 60-65 Units into the skin See admin instructions. 65 in the morning, 60 in the afternoon and 60 at bedtime    [provider]  lactulose  (CHRONULAC ) 10 GM/15ML solution Take 45 mLs (30 g total) by mouth 3 (three) times daily. 02/29/24   Rashid, Farhan, MD  midodrine  (PROAMATINE ) 5 MG tablet Take 3 tablets (15 mg total) by mouth with breakfast, with lunch, and with evening meal. Patient taking differently: Take 10 mg by mouth 2 (two) times daily with a meal. 02/03/24   Samtani, Jai-Gurmukh, MD  montelukast  (SINGULAIR ) 10 MG tablet Take 1 tablet (10 mg  total) by mouth at bedtime. 08/25/22   [name], MD  omeprazole  (PRILOSEC) 40 MG capsule Take 40 mg by mouth in the morning. May take an additional dose as needed    [provider]  ondansetron  (ZOFRAN -ODT) 4 MG disintegrating tablet Take 1 tablet (4 mg total) by mouth every 8 (eight) hours as needed for nausea or vomiting. 01/06/24   Garrick Charleston, MD  rifaximin  (XIFAXAN ) 550 MG TABS tablet Take 550 mg by mouth 2 (two) times daily.    [provider]  rOPINIRole  (REQUIP ) 1 MG tablet Take 1 tablet (1 mg total) by mouth at bedtime. 01/03/24   [name], MD  spironolactone  (ALDACTONE ) 50 MG tablet Take 1 tablet (50 mg total) by mouth daily. 02/22/24   Sherrill Cable Latif, DO  temazepam  (RESTORIL ) 30 MG capsule Take 1 capsule (30 mg total) by mouth at bedtime as needed for sleep. Patient taking differently: Take 30 mg by mouth at bedtime. 02/13/24   [name], MD    Current Facility-Administered Medications  Medication Dose Route Frequency Provider Last Rate Last Admin   acetaminophen  (TYLENOL ) tablet 650 mg  650 mg Oral Q6H PRN Patel, Ekta V, MD       Or   acetaminophen  (TYLENOL ) suppository 650 mg  650 mg Rectal Q6H PRN Tobie Mario GAILS, MD       albumin  human 25 % solution 25 g  25 g Intravenous Q8H Patel, Ekta V, MD 60 mL/hr at 03/12/24 1512 25 g at 03/12/24 1512   ceFEPIme  (MAXIPIME ) 2 g in sodium chloride  0.9 % 100 mL IVPB  2 g Intravenous Q8H Tobie Mario GAILS, MD       fentaNYL  (SUBLIMAZE ) injection 50 mcg  50 mcg Intravenous Once [place], Megan L, [place]       FLUoxetine  (PROZAC ) capsule 40 mg  40 mg Oral BID Patel, Ekta V, MD       insulin  aspart (novoLOG ) injection 0-9 Units  0-9 Units Subcutaneous Q4H Tobie Mario GAILS, MD       lactated ringers  infusion   Intravenous Continuous Dreama Longs, MD 150 mL/hr at 03/12/24 1301 New Bag at 03/12/24 1301   lactulose  (CHRONULAC ) 10 GM/15ML solution 30 g  30 g Oral TID Tobie Mario GAILS, MD       metroNIDAZOLE  (FLAGYL ) IVPB 500  mg  500 mg Intravenous Q12H Tobie Mario GAILS, MD       montelukast  (SINGULAIR ) tablet 10 mg  10 mg Oral QHS Tobie Mario GAILS, MD       pantoprazole  (PROTONIX ) injection 40 mg  40  mg Intravenous Q12H Tobie Mario GAILS, MD       rifaximin  (XIFAXAN ) tablet 550 mg  550 mg Oral BID Patel, Ekta V, MD   550 mg at 03/12/24 1513   rOPINIRole  (REQUIP ) tablet 1 mg  1 mg Oral QHS Tobie Mario GAILS, MD       spironolactone  (ALDACTONE ) tablet 50 mg  50 mg Oral Daily Patel, Ekta V, MD   50 mg at 03/12/24 1513   [START ON 03/13/2024] vancomycin  (VANCOCIN ) IVPB 1000 mg/200 mL premix  1,000 mg Intravenous Q12H Patel, Ekta V, MD       Current Outpatient Medications  Medication Sig Dispense Refill   baclofen (LIORESAL) 10 MG tablet Take 10 mg by mouth once a week. After paracentesis     clotrimazole -betamethasone  (LOTRISONE ) cream Apply 1 Application topically daily as needed (rash).     Continuous Glucose Sensor (DEXCOM G7 SENSOR) MISC Use 1 sensor for continuous glucose monitoring every 10 days for 30 days 3 each 5   ergocalciferol  (VITAMIN D2) 1.25 MG (50000 UT) capsule Take 50,000 Units by mouth once a week. Take weekly on Thursday     FLUoxetine  (PROZAC ) 40 MG capsule Take 1 capsule (40 mg total) by mouth 2 (two) times daily. 180 capsule 3   insulin  regular human CONCENTRATED (HUMULIN  R) 500 UNIT/ML injection Inject 60-65 Units into the skin See admin instructions. 65 in the morning, 60 in the afternoon and 60 at bedtime     lactulose  (CHRONULAC ) 10 GM/15ML solution Take 45 mLs (30 g total) by mouth 3 (three) times daily.     midodrine  (PROAMATINE ) 5 MG tablet Take 3 tablets (15 mg total) by mouth with breakfast, with lunch, and with evening meal. (Patient taking differently: Take 10 mg by mouth 2 (two) times daily with a meal.)     montelukast  (SINGULAIR ) 10 MG tablet Take 1 tablet (10 mg total) by mouth at bedtime. 90 tablet 3   omeprazole  (PRILOSEC) 40 MG capsule Take 40 mg by mouth in the morning. May take an additional  dose as needed     ondansetron  (ZOFRAN -ODT) 4 MG disintegrating tablet Take 1 tablet (4 mg total) by mouth every 8 (eight) hours as needed for nausea or vomiting. 20 tablet 0   rifaximin  (XIFAXAN ) 550 MG TABS tablet Take 550 mg by mouth 2 (two) times daily.     rOPINIRole  (REQUIP ) 1 MG tablet Take 1 tablet (1 mg total) by mouth at bedtime. 30 tablet 5   spironolactone  (ALDACTONE ) 50 MG tablet Take 1 tablet (50 mg total) by mouth daily. 30 tablet 0   temazepam  (RESTORIL ) 30 MG capsule Take 1 capsule (30 mg total) by mouth at bedtime as needed for sleep. (Patient taking differently: Take 30 mg by mouth at bedtime.) 30 capsule 5    Allergies as of 03/12/2024 - Review Complete 03/12/2024  Allergen Reaction Noted   Morphine  Itching and Rash 01/10/2024   Penicillins Hives 02/16/2011   Vitamin k  and related Other (See Comments) 11/19/2023   Latex Itching and Other (See Comments) 07/25/2017   Vancomycin  Itching 10/04/2023   Amoxicillin Hives and Itching 02/16/2011   Azithromycin  Other (See Comments) 07/25/2017   Dulaglutide  Other (See Comments) 08/21/2020   Empagliflozin -metformin  hcl er Swelling and Other (See Comments) 08/21/2020   Januvia  [sitagliptin ] Other (See Comments) 11/18/2014   Metformin  Other (See Comments) 12/16/2022    Family History  Problem Relation Age of Onset   Colon polyps Mother    Depression Mother  Anxiety disorder Mother    Colon polyps Father    Sleep apnea Father    Anxiety disorder Father    Depression Father    Diabetes Father    Bladder Cancer Father 18   Rheum arthritis Father    Migraines Maternal Aunt    Migraines Maternal Grandmother    Diabetes Maternal Grandfather    Healthy Daughter    Asthma Daughter    Anxiety disorder Daughter    Anxiety disorder Son    Asthma Son    Healthy Son    Cerebral aneurysm Cousin    Heart disease Other    Depression Other    Anxiety disorder Other    Sleep apnea Other    Colon cancer Neg Hx    Esophageal  cancer Neg Hx    Stomach cancer Neg Hx    Rectal cancer Neg Hx     Social History   Socioeconomic History   Marital status: Married    Spouse name: Adam   Number of children: 2   Years of education: Not on file   Highest education level: 12th grade  Occupational History   Occupation: Arts Administrator and Nutrition    Employer: [employer]    Comment: works 20 hours per week  Tobacco Use   Smoking status: Never    Passive exposure: Current   Smokeless tobacco: Never  Vaping Use   Vaping status: Never Used  Substance and Sexual Activity   Alcohol use: No    Alcohol/week: 0.0 standard drinks of alcohol   Drug use: No   Sexual activity: Yes    Partners: Male    Birth control/protection: None  Other Topics Concern   Not on file  Social History Narrative   Not on file   Social Drivers of Health   Financial Resource Strain: Low Risk  (09/19/2022)   Overall Financial Resource Strain (CARDIA)    Difficulty of Paying Living Expenses: Not hard at all  Food Insecurity: No Food Insecurity (02/26/2024)   Hunger Vital Sign    Worried About Running Out of Food in the Last Year: Never true    Ran Out of Food in the Last Year: Never true  Transportation Needs: No Transportation Needs (02/26/2024)   PRAPARE - Administrator, Civil Service (Medical): No    Lack of Transportation (Non-Medical): No  Physical Activity: Insufficiently Active (06/04/2021)   Exercise Vital Sign    Days of Exercise per Week: 2 days    Minutes of Exercise per Session: 10 min  Stress: Stress Concern Present (09/19/2022)   [employer] of Occupational Health - Occupational Stress Questionnaire    Feeling of Stress : To some extent  Social Connections: Socially Integrated (10/05/2023)   Social Connection and Isolation Panel    Frequency of Communication with Friends and Family: More than three times a week    Frequency of Social Gatherings with Friends and Family: Twice a week    Attends  Religious Services: More than 4 times per year    Active Member of [employer] or Organizations: No    Attends Engineer, Structural: More than 4 times per year    Marital Status: Married  Catering Manager Violence: Not At Risk (02/26/2024)   Humiliation, Afraid, Rape, and Kick questionnaire    Fear of Current or Ex-Partner: No    Emotionally Abused: No    Physically Abused: No    Sexually Abused: No    Review of Systems: ROS  is O/W negative except as mentioned in HPI.  Physical Exam: Vital signs in last 24 hours: Temp:  [99.1 F (37.3 C)-103.1 F (39.5 C)] 102.3 F (39.1 C) (12/01 1415) Pulse Rate:  [102-110] 105 (12/01 1500) Resp:  [20-36] 33 (12/01 1500) BP: (106-131)/(38-66) 118/49 (12/01 1500) SpO2:  [96 %-100 %] 98 % (12/01 1500) Weight:  [83.5 kg] 83.5 kg (12/01 1332)   General:  Alert, chronically ill-appearing, jaundiced Head:  Normocephalic and atraumatic. Eyes:  Scleral icterus noted. Ears:  Normal auditory acuity. Mouth:  Lips are dry with blood on them. Lungs:  Clear throughout to auscultation.  No wheezes, crackles, or rhonchi.  Heart:  Regular rate and rhythm; no murmurs, clicks, rubs, or gallops. Abdomen:  Softly distended. BS present.  Some upper abdominal TTP. Rectal:  Deferred  Msk:  Symmetrical without gross deformities.  Pulses:  Normal pulses noted. Extremities:  Some B/L LE edema noted. Neurologic:  Alert and oriented x 4;  grossly normal neurologically. Skin:  Intact without significant lesions or rashes. Psych:  Alert and cooperative. Normal mood and affect.  Intake/Output this shift: Total I/O In: 2197.9 [IV Piggyback:2197.9] Out: -   Lab Results: Recent Labs    03/12/24 1135  WBC 1.7*  HGB 8.0*  HCT 25.4*  PLT 57*   BMET Recent Labs    03/12/24 1221  NA 131*  K 3.6  CL 97*  CO2 21*  GLUCOSE 217*  BUN 13  CREATININE 0.90  CALCIUM  8.4*   LFT Recent Labs    03/12/24 1221  PROT 6.0*  ALBUMIN  3.3*  AST 46*  ALT 23   ALKPHOS 58  BILITOT 5.3*   PT/INR Recent Labs    03/12/24 1221  LABPROT 27.0*  INR 2.4*   Studies/Results: DG Chest Port 1 View if patient is in a treatment room. Result Date: 03/12/2024 CLINICAL DATA:  Possible sepsis. EXAM: PORTABLE CHEST 1 VIEW COMPARISON:  02/26/2024.  CT chest 10/04/2023. FINDINGS: Trachea is midline. Heart is enlarged. Lungs are somewhat low in volume with minimal streaky atelectasis in the lung bases. No airspace consolidation or pleural fluid. IMPRESSION: Low lung volumes with mild streaky bibasilar atelectasis. Electronically Signed   By: Newell Eke M.D.   On: 03/12/2024 14:01   IR Paracentesis Result Date: 03/12/2024 INDICATION: 43 year old female. History of cirrhosis with recurrent ascites. Request is for therapeutic paracentesis. 8 L maximum EXAM: ULTRASOUND GUIDED THERAPEUTIC LEFT-SIDED PARACENTESIS MEDICATIONS: Lidocaine  1% 10 mL COMPLICATIONS: None immediate. PROCEDURE: Informed written consent was obtained from the patient after a discussion of the risks, benefits and alternatives to treatment. A timeout was performed prior to the initiation of the procedure. Initial ultrasound scanning demonstrates a large amount of ascites within the left lower abdominal quadrant. The left lower abdomen was prepped and draped in the usual sterile fashion. 1% lidocaine  was used for local anesthesia. Following this, a 19 gauge, 7-cm, Yueh catheter was introduced. An ultrasound image was saved for documentation purposes. The paracentesis was performed. The catheter was removed and a dressing was applied. The patient tolerated the procedure well without immediate post procedural complication. Patient received post-procedure intravenous albumin ; see nursing notes for details. FINDINGS: A total of approximately 8 L of straw-colored fluid was removed. Diagnostic samples held in IR she team a request fluid sampling. IMPRESSION: Successful ultrasound-guided paracentesis yielding 8  liters of peritoneal fluid. Performed by Delon Beagle NP PLAN: Patient currently being worked up for possible transplant [facility] in [place]. Electronically Signed   By: Juliene Balder  M.D.   On: 03/12/2024 11:48   IMPRESSION:  43 year old female with decompensated MASH cirrhosis with history of gastropathy, esophageal varices, hepatic encephalopathy,  and recurrent large-volume ascites requiring frequent paracentesis.  She is listed on liver transplant list with [facility] in [place].  Currently requiring LVP every 5-7 days.  Had 8 Liter paracentesis this AM, negative for SBP.  Developed fever and lethary.  Says that she has a headache and started feeling bad at 3 AM.  Lactic acid elevated at 5.5->4.1.  MELD 3.0: 26 at 03/12/2024 12:21 PM MELD-Na: 26 at 03/12/2024 12:21 PM Calculated from: Serum Creatinine: 0.9 mg/dL (Using min of 1 mg/dL) at 87/11/7972 87:78 PM Serum Sodium: 131 mmol/L at 03/12/2024 12:21 PM Total Bilirubin: 5.3 mg/dL at 87/11/7972 87:78 PM Serum Albumin : 3.3 g/dL at 87/11/7972 87:78 PM INR(ratio): 2.4 at 03/12/2024 12:21 PM Age at listing (hypothetical): 43 years Sex: Female at 03/12/2024 12:21 PM   Coagulopathy with INR of 2.4  Leukopenia with WBC count 1.7  Chronic normocytic anemia with stable Hgb 8.0 grams  Prolonged QT interval   DM2  PLAN: -Continue xifaxan , lactulose , low dose diuretics (renal function is normal).  -Follow-up blood cultures that have already been drawn and UA. -Consider IV Vitamin K . -Await CT head that has been ordered. -Cefepime , flagyl , and vancomycin  have been ordered, continue for now. -Discussed with hospitalist about pain control and antiemetics. -Has some upper abdominal pain.  If it continues/worsens then low threshold to perform CT scan.    Harlene BIRCH. Zehr  03/12/2024, 3:19 PM

## 2024-03-12 NOTE — H&P (Addendum)
 History and Physical    Patient: Laurie Sutton FMW:[id number] DOB: [birth year] DOA: 03/12/2024 DOS: the patient was seen and examined on 03/12/2024 . PCP: Default, Provider, MD  Patient coming from: Home Chief complaint: Chief Complaint  Patient presents with   Code Sepsis   HPI:  Laurie Sutton is a 43 y.o. female with past medical history  of  43 year old full code with complete treatment goal on liver transplant list with past medical history of end-stage liver disease secondary to MASLD cirrhosis on furosemide , spironolactone , midodrine , rifaximin , history of SBP, hepatic encephalopathy, portal hypertension with esophageal varices, chronic hyponatremia, type 2 diabetes, chronic anxiety/depression, GERD, restless leg syndrome, chronic hypotension on midodrine , being admitted today for lethargy fever and concern for sepsis.   Chart review shows patient was seen today for her scheduled paracentesis and had about 8 L removed cell count is pending.  After reaching home patient did not feel well, had a fever and was tachycardic and lethargic but oriented. At bedside patient states that she has a headache and nothing else is hurting.  I have requested a stat head CT and given a verbal order to patient's RN.  Admission requested for same, patient meeting sepsis with organ dysfunction criteria, suspected source of infection is SBP possibly.  ED Course:  Vitals since arrival show fever of 103.1 max tachycardia heart rate of 108 respirations of 32 blood pressure 106/44 O2 sats 100% on room air. Vitals:   03/12/24 1400 03/12/24 1415 03/12/24 1430 03/12/24 1445  BP: (!) 118/46 (!) 127/52 (!) 118/48 (!) 127/45  Pulse: (!) 107 (!) 109 (!) 110 (!) 107  Temp:  (!) 102.3 F (39.1 C)    Resp: (!) 28 (!) 27 (!) 33 (!) 31  Height:      Weight:      SpO2: 97% 96% 96% 96%  TempSrc:  Axillary    BMI (Calculated):       >>ED evaluation thus far shows: Initial CMP shows hyponatremia of 131  chloride 97 bicarb 21 glucose 217 creatinine 0.90 AST 46 total bili of 5.3.  Initial lactic of 5.5 and repeat of 4.1.  Initial CBC showing pancytopenia with a white count of 1.7 hemoglobin of 8 platelets of 57.  Magnesium  ordered and pending. Ammonia ordered and pending. Head CT noncontrast ordered and pending. IV PPI ordered.   Chart review shows patient had a CT of the abdomen and pelvis angio on 5 November which showed cirrhosis, large volume ascites, gallbladder wall edema, colonic wall edema, portal hypertensive anterocolopathy possible enterocolitis and moderate splenomegaly.  Chest x-ray today shows low lung volumes and mild streaky bibasilar atelectasis.  EKG sinus tach at 106 PR 122 QTc prolonged at 518, repeat EKG showed A-fib with absent P waves heart rate 127 QTc of 355, intermittent nonsustained V. tach.  EKG 1257 showed again ventricular tachycardia junctional rhythm at 158.   >>While in the ED patient received the following: Medications  lactated ringers  infusion ( Intravenous New Bag/Given 03/12/24 1301)  vancomycin  (VANCOREADY) IVPB 2000 mg/400 mL (2,000 mg Intravenous New Bag/Given 03/12/24 1315)  fentaNYL  (SUBLIMAZE ) injection 50 mcg (0 mcg Intravenous Hold 03/12/24 1404)  lidocaine -EPINEPHrine  (XYLOCAINE  W/EPI) 1 %-1:100000 (with pres) injection 20 mL (20 mLs Intradermal Given 03/12/24 1014)  albumin  human 25 % solution 12.5 g (0 g Intravenous Stopped 03/12/24 1308)  lactated ringers  bolus 2,000 mL (0 mLs Intravenous Stopped 03/12/24 1308)  ceFEPIme  (MAXIPIME ) 2 g in sodium chloride  0.9 % 100 mL IVPB (0 g Intravenous  Stopped 03/12/24 1236)  metroNIDAZOLE  (FLAGYL ) IVPB 500 mg (0 mg Intravenous Stopped 03/12/24 1306)  ondansetron  (ZOFRAN ) injection 4 mg (4 mg Intravenous Given 03/12/24 1215)  magnesium  sulfate IVPB 2 g 50 mL (0 g Intravenous Stopped 03/12/24 1359)  LORazepam  (ATIVAN ) injection 1 mg (1 mg Intravenous Given 03/12/24 1355)  lactated ringers  bolus 1,000 mL (1,000  mLs Intravenous New Bag/Given 03/12/24 1416)  pantoprazole  (PROTONIX ) injection 40 mg (40 mg Intravenous Given 03/12/24 1416)  acetaminophen  (TYLENOL ) tablet 650 mg (650 mg Oral Given 03/12/24 1414)   Review of Systems  Neurological:  Positive for headaches.   Past Medical History:  Diagnosis Date   ADHD    Allergy    Anemia    IRON TRANSFUSION 07-2020 NONE SINCE   Anxiety    Back pain    COVID 08/12/2019   ALL SYMPTOMS REOLVED PER PT   Depression    Diabetic neuropathy (HCC) 11/11/2020   FEET   dm type 2    sees Dr. Odella Jacobson at [facility]   Fatty liver    GERD (gastroesophageal reflux disease)    History of kidney stones    Joint pain    Menorrhagia 11/11/2020   Migraines    Murmur, cardiac    FAINT NO CARDIOLOGIST   Other fatigue    Pneumonia 08/12/2019   COVID PNEUMONIA ALL SYMPTOMS RESOLVED PER PT   Sepsis (HCC) 10/04/2023   Shortness of breath on exertion    Past Surgical History:  Procedure Laterality Date   CESAREAN SECTION  12/13/2006   COLONOSCOPY WITH PROPOFOL  N/A 08/02/2023   Procedure: COLONOSCOPY WITH PROPOFOL ;  Surgeon: Legrand Victory LITTIE DOUGLAS, MD;  Location: [facility];  Service: Gastroenterology;  Laterality: N/A;   ESOPHAGOGASTRODUODENOSCOPY N/A 02/16/2024   Procedure: EGD (ESOPHAGOGASTRODUODENOSCOPY);  Surgeon: Albertus Gordy HERO, MD;  Location: [facility] END[facility];  Service: Gastroenterology;  Laterality: N/A;   ESOPHAGOGASTRODUODENOSCOPY (EGD) WITH PROPOFOL  N/A 08/02/2023   Procedure: ESOPHAGOGASTRODUODENOSCOPY (EGD) WITH PROPOFOL ;  Surgeon: Legrand Victory LITTIE DOUGLAS, MD;  Location: [facility];  Service: Gastroenterology;  Laterality: N/A;   EXTRACORPOREAL SHOCK WAVE LITHOTRIPSY  2018   FLEXIBLE SIGMOIDOSCOPY N/A 11/20/2023   Procedure: KINGSTON SIDE;  Surgeon: Albertus Gordy HERO, MD;  Location: [facility] ENDOS[facility];  Service: Gastroenterology;  Laterality: N/A;   FOOT SURGERY Right 10/02/2020   RIGHT FOOT HEEL AND TOES, SURGICAL CENTER OFF [address]   IR  PARACENTESIS  07/06/2023   IR PARACENTESIS  08/18/2023   IR PARACENTESIS  10/05/2023   IR PARACENTESIS  10/11/2023   IR PARACENTESIS  10/20/2023   IR PARACENTESIS  11/08/2023   IR PARACENTESIS  11/28/2023   IR PARACENTESIS  12/09/2023   IR PARACENTESIS  12/15/2023   IR PARACENTESIS  12/16/2023   IR PARACENTESIS  12/23/2023   IR PARACENTESIS  12/30/2023   IR PARACENTESIS  01/06/2024   IR PARACENTESIS  01/06/2024   IR PARACENTESIS  01/10/2024   IR PARACENTESIS  01/13/2024   IR PARACENTESIS  01/20/2024   IR PARACENTESIS  01/27/2024   IR PARACENTESIS  02/01/2024   IR PARACENTESIS  02/03/2024   IR PARACENTESIS  02/08/2024   IR PARACENTESIS  02/15/2024   IR PARACENTESIS  02/20/2024   IR PARACENTESIS  02/27/2024   IR PARACENTESIS  02/29/2024   IR PARACENTESIS  03/06/2024   IR PARACENTESIS  03/12/2024   IR TRANSCATHETER BX  07/26/2023   IR US  GUIDE VASC ACCESS RIGHT  07/26/2023   IR VENOGRAM HEPATIC W HEMODYNAMIC EVALUATION  07/26/2023   LAPAROSCOPIC VAGINAL  HYSTERECTOMY WITH SALPINGECTOMY Bilateral 11/17/2020   Procedure: LAPAROSCOPIC ASSISTED VAGINAL HYSTERECTOMY WITH BILATERAL SALPINGECTOMY;  Surgeon: Dannielle Bouchard, DO;  Location: [facility];  Service: Gynecology;  Laterality: Bilateral;   POLYPECTOMY  08/02/2023   Procedure: POLYPECTOMY, INTESTINE;  Surgeon: Legrand Victory LITTIE DOUGLAS, MD;  Location: [facility];  Service: Gastroenterology;;    reports that she has never smoked. She has been exposed to tobacco smoke. She has never used smokeless tobacco. She reports that she does not drink alcohol and does not use drugs. Allergies  Allergen Reactions   Morphine  Itching and Rash    Severe    Penicillins Hives    Has patient had a PCN reaction causing immediate rash, facial/tongue/throat swelling, SOB or lightheadedness with hypotension: yes. Rash  Has patient had a PCN reaction causing severe rash involving mucus membranes or skin necrosis: Yes- rash and hives all over body  Has patient had a PCN  reaction that required hospitalization No Has patient had a PCN reaction occurring within the last 10 years: Yes  If all of the above answers are NO, then may proceed with Cephalosporin use.    Vitamin K  And Related Other (See Comments)    Electric shock    Latex Itching and Other (See Comments)    IRRITATES VAGINAL AREA   Vancomycin  Itching   Amoxicillin Hives and Itching   Azithromycin  Other (See Comments)    DOES NOT WORK   Dulaglutide  Other (See Comments)    constipation and stomach issues  **Trulicity **    Empagliflozin -Metformin  Hcl Er Swelling and Other (See Comments)   Januvia  [Sitagliptin ] Other (See Comments)    HURTS STOAMCH   Metformin  Other (See Comments)    Other Reaction(s): achy all over   Family History  Problem Relation Age of Onset   Colon polyps Mother    Depression Mother    Anxiety disorder Mother    Colon polyps Father    Sleep apnea Father    Anxiety disorder Father    Depression Father    Diabetes Father    Bladder Cancer Father 24   Rheum arthritis Father    Migraines Maternal Aunt    Migraines Maternal Grandmother    Diabetes Maternal Grandfather    Healthy Daughter    Asthma Daughter    Anxiety disorder Daughter    Anxiety disorder Son    Asthma Son    Healthy Son    Cerebral aneurysm Cousin    Heart disease Other    Depression Other    Anxiety disorder Other    Sleep apnea Other    Colon cancer Neg Hx    Esophageal cancer Neg Hx    Stomach cancer Neg Hx    Rectal cancer Neg Hx    Prior to Admission medications   Medication Sig Start Date End Date Taking? Authorizing Provider  baclofen (LIORESAL) 10 MG tablet Take 10 mg by mouth once a week. After paracentesis 02/10/24 02/09/25  [provider]  clotrimazole -betamethasone  (LOTRISONE ) cream Apply 1 Application topically daily as needed (rash).    [provider]  Continuous Glucose Sensor (DEXCOM G7 SENSOR) MISC Use 1 sensor for continuous glucose monitoring every  10 days for 30 days 05/25/23   Braulio Hough, MD  ergocalciferol  (VITAMIN D2) 1.25 MG (50000 UT) capsule Take 50,000 Units by mouth once a week. Take weekly on Thursday    [provider]  FLUoxetine  (PROZAC ) 40 MG capsule Take 1 capsule (40 mg total) by mouth 2 (two)  times daily. 01/03/24   [name], MD  insulin  regular human CONCENTRATED (HUMULIN  R) 500 UNIT/ML injection Inject 60-65 Units into the skin See admin instructions. 65 in the morning, 60 in the afternoon and 60 at bedtime    [provider]  lactulose  (CHRONULAC ) 10 GM/15ML solution Take 45 mLs (30 g total) by mouth 3 (three) times daily. 02/29/24   Rashid, Farhan, MD  midodrine  (PROAMATINE ) 5 MG tablet Take 3 tablets (15 mg total) by mouth with breakfast, with lunch, and with evening meal. Patient taking differently: Take 10 mg by mouth 2 (two) times daily with a meal. 02/03/24   Samtani, Jai-Gurmukh, MD  montelukast  (SINGULAIR ) 10 MG tablet Take 1 tablet (10 mg total) by mouth at bedtime. 08/25/22   [name], MD  omeprazole  (PRILOSEC) 40 MG capsule Take 40 mg by mouth in the morning. May take an additional dose as needed    [provider]  ondansetron  (ZOFRAN -ODT) 4 MG disintegrating tablet Take 1 tablet (4 mg total) by mouth every 8 (eight) hours as needed for nausea or vomiting. 01/06/24   Garrick Charleston, MD  rifaximin  (XIFAXAN ) 550 MG TABS tablet Take 550 mg by mouth 2 (two) times daily.    [provider]  rOPINIRole  (REQUIP ) 1 MG tablet Take 1 tablet (1 mg total) by mouth at bedtime. 01/03/24   [name], MD  spironolactone  (ALDACTONE ) 50 MG tablet Take 1 tablet (50 mg total) by mouth daily. 02/22/24   Sherrill Cable Latif, DO  temazepam  (RESTORIL ) 30 MG capsule Take 1 capsule (30 mg total) by mouth at bedtime as needed for sleep. Patient taking differently: Take 30 mg by mouth at bedtime. 02/13/24   [name], MD                                                                                  Vitals:   03/12/24 1400 03/12/24 1415 03/12/24 1430 03/12/24 1445  BP: (!) 118/46 (!) 127/52 (!) 118/48 (!) 127/45  Pulse: (!) 107 (!) 109 (!) 110 (!) 107  Resp: (!) 28 (!) 27 (!) 33 (!) 31  Temp:  (!) 102.3 F (39.1 C)    TempSrc:  Axillary    SpO2: 97% 96% 96% 96%  Weight:      Height:       Physical Exam Vitals reviewed.  Constitutional:      General: She is not in acute distress.    Appearance: She is not ill-appearing.  HENT:     Head: Normocephalic and atraumatic.     Mouth/Throat:     Mouth: Mucous membranes are dry.  Eyes:     Extraocular Movements: Extraocular movements intact.     Pupils: Pupils are equal, round, and reactive to light.  Cardiovascular:     Rate and Rhythm: Regular rhythm. Tachycardia present.     Pulses: Normal pulses.     Heart sounds: Normal heart sounds.  Pulmonary:     Effort: Pulmonary effort is normal.     Breath sounds: Normal breath sounds.  Abdominal:     General: There is no distension.     Palpations: Abdomen is soft.  Tenderness: There is no abdominal tenderness.  Musculoskeletal:     Right lower leg: No edema.     Left lower leg: No edema.  Neurological:     General: No focal deficit present.     Mental Status: She is alert and oriented to person, place, and time.     Labs on Admission: I have personally reviewed following labs and imaging studies CBC: Recent Labs  Lab 03/12/24 1135  WBC 1.7*  NEUTROABS 1.4*  HGB 8.0*  HCT 25.4*  MCV 88.8  PLT 57*   Basic Metabolic Panel: Recent Labs  Lab 03/12/24 1221  NA 131*  K 3.6  CL 97*  CO2 21*  GLUCOSE 217*  BUN 13  CREATININE 0.90  CALCIUM  8.4*   GFR: Estimated Creatinine Clearance: 87.8 mL/min (by C-G formula based on SCr of 0.9 mg/dL). Liver Function Tests: Recent Labs  Lab 03/12/24 1221  AST 46*  ALT 23  ALKPHOS 58  BILITOT 5.3*  PROT 6.0*  ALBUMIN  3.3*   No results for input(s): LIPASE, AMYLASE in the last 168 hours. Recent  Labs  Lab 03/12/24 1316  AMMONIA 93*   Recent Labs    02/16/24 0549 02/17/24 0333 02/19/24 1304 02/20/24 0604 02/21/24 0419 02/26/24 0033 02/27/24 0438 02/27/24 1929 02/28/24 0427 03/12/24 1221  BUN 6 6 7 8 10 9 6  <5* 5* 13  CREATININE 1.50* 0.65 0.86 0.75 0.77 0.74 0.65 0.67 0.70 0.90    Cardiac Enzymes: No results for input(s): CKTOTAL, CKMB, CKMBINDEX, TROPONINI in the last 168 hours. BNP (last 3 results) No results for input(s): PROBNP in the last 8760 hours. HbA1C: No results for input(s): HGBA1C in the last 72 hours. CBG: No results for input(s): GLUCAP in the last 168 hours. Lipid Profile: No results for input(s): CHOL, HDL, LDLCALC, TRIG, CHOLHDL, LDLDIRECT in the last 72 hours. Thyroid  Function Tests: No results for input(s): TSH, T4TOTAL, FREET4, T3FREE, THYROIDAB in the last 72 hours. Anemia Panel: No results for input(s): VITAMINB12, FOLATE, FERRITIN, TIBC, IRON, RETICCTPCT in the last 72 hours. Urine analysis:    Component Value Date/Time   COLORURINE AMBER (A) 02/16/2024 0420   APPEARANCEUR CLEAR 02/16/2024 0420   LABSPEC >1.046 (H) 02/16/2024 0420   PHURINE 5.0 02/16/2024 0420   GLUCOSEU >=500 (A) 02/16/2024 0420   HGBUR NEGATIVE 02/16/2024 0420   BILIRUBINUR NEGATIVE 02/16/2024 0420   BILIRUBINUR 1+ 02/13/2024 1151   KETONESUR NEGATIVE 02/16/2024 0420   PROTEINUR NEGATIVE 02/16/2024 0420   UROBILINOGEN 1.0 02/13/2024 1151   UROBILINOGEN 1.0 12/21/2014 1020   NITRITE NEGATIVE 02/16/2024 0420   LEUKOCYTESUR NEGATIVE 02/16/2024 0420   Radiological Exams on Admission: DG Chest Port 1 View if patient is in a treatment room. Result Date: 03/12/2024 CLINICAL DATA:  Possible sepsis. EXAM: PORTABLE CHEST 1 VIEW COMPARISON:  02/26/2024.  CT chest 10/04/2023. FINDINGS: Trachea is midline. Heart is enlarged. Lungs are somewhat low in volume with minimal streaky atelectasis in the lung bases. No airspace  consolidation or pleural fluid. IMPRESSION: Low lung volumes with mild streaky bibasilar atelectasis. Electronically Signed   By: Newell Eke M.D.   On: 03/12/2024 14:01   IR Paracentesis Result Date: 03/12/2024 INDICATION: 43 year old female. History of cirrhosis with recurrent ascites. Request is for therapeutic paracentesis. 8 L maximum EXAM: ULTRASOUND GUIDED THERAPEUTIC LEFT-SIDED PARACENTESIS MEDICATIONS: Lidocaine  1% 10 mL COMPLICATIONS: None immediate. PROCEDURE: Informed written consent was obtained from the patient after a discussion of the risks, benefits and alternatives to treatment. A timeout was performed prior to  the initiation of the procedure. Initial ultrasound scanning demonstrates a large amount of ascites within the left lower abdominal quadrant. The left lower abdomen was prepped and draped in the usual sterile fashion. 1% lidocaine  was used for local anesthesia. Following this, a 19 gauge, 7-cm, Yueh catheter was introduced. An ultrasound image was saved for documentation purposes. The paracentesis was performed. The catheter was removed and a dressing was applied. The patient tolerated the procedure well without immediate post procedural complication. Patient received post-procedure intravenous albumin ; see nursing notes for details. FINDINGS: A total of approximately 8 L of straw-colored fluid was removed. Diagnostic samples held in IR she team a request fluid sampling. IMPRESSION: Successful ultrasound-guided paracentesis yielding 8 liters of peritoneal fluid. Performed by Delon Beagle NP PLAN: Patient currently being worked up for possible transplant [facility] [place]. Electronically Signed   By: Juliene Balder M.D.   On: 03/12/2024 11:48   Data Reviewed: Relevant notes from primary care and specialist visits, past discharge summaries as available in EHR, including Care Everywhere . Prior diagnostic testing as pertinent to current admission diagnoses, Updated medications and  problem lists for reconciliation .ED course, including vitals, labs, imaging, treatment and response to treatment,Triage notes, nursing and pharmacy notes and ED provider's notes.Notable results as noted in HPI.Discussed case with EDMD/ ED APP/ or Specialty MD on call and as needed.  Assessment & Plan  83-year-old with decompensated liver cirrhosis on liver transplant team clinic at [facility] presenting with altered mental status and lethargy sepsis presentation fever lactic acidosis.  >>Lethargy: 2/2 Hepatic Encephalopathy.  Head CT noncontrast. Nh4 elelvated and lactulose  continued.    >>Sepsis and organ dysfunction: Pt meet sepsis criteria and suspected source is SBP we will wait for cell count from paracentesis done today and cont with broad spectrum iv abx.  Will follow culture sensitivity.  IV fluids continued, IV albumin  started.   >> Lactic acidosis: Differentials also include secondary to liver cirrhosis.  However patient presenting with fever pancytopenia suspect she may have an underlying infection as well.  >>Decompensated liver cirrhosis with ascites: Patient found this with liver transplant team at [facility]. INR today is 2.4 PT 27, ammonia 93, total bili of 5.3, with Meld of 26 and CPS of 14 meeting criteria for urgent liver transplant and have consulted GI and they will contact [facility]. Aldactone  continued.  Lactulose  continued. Patient underwent therapeutic paracentesis earlier today. IR and additional fluid removal as deemed appropriate per a.m. team.     >> Pancytopenia: Secondary to combination of patient cirrhosis and suspect underlying GI bleed.  IV PPI, type and screen transfuse as deemed appropriate.  >>Anemia: Suspect underlying GI bleed patient had a guaiac positive stool 2 months ago we will get GI consulted.low threshold for octreotide .    >> Diabetes mellitus type 2: Most recent A1c of 7.9, glycemic protocol with every 4 coverage  as  patient is lethargic currently  >> Prolonged QT: Attribute to hypomagnesemia levels are pending.   DVT prophylaxis:  Scd's.  Consults:  GI.   Advance Care Planning:    Code Status: Full Code   Family Communication:  Spouse was here earlier.  Disposition Plan:  Home.  Severity of Illness: The appropriate patient status for this patient is INPATIENT. Inpatient status is judged to be reasonable and necessary in order to provide the required intensity of service to ensure the patient's safety. The patient's presenting symptoms, physical exam findings, and initial radiographic and laboratory data in the context  of their chronic comorbidities is felt to place them at high risk for further clinical deterioration. Furthermore, it is not anticipated that the patient will be medically stable for discharge from the hospital within 2 midnights of admission.   * I certify that at the point of admission it is my clinical judgment that the patient will require inpatient hospital care spanning beyond 2 midnights from the point of admission due to high intensity of service, high risk for further deterioration and high frequency of surveillance required.*  Unresulted Labs (From admission, onward)     Start     Ordered   03/13/24 0500  Occult blood card to lab, stool RN will collect  Daily,   R     Question:  Specimen to be collected by:  Answer:  RN will collect   03/12/24 1450   03/13/24 0500  Comprehensive metabolic panel with GFR  Tomorrow morning,   R        03/12/24 1450   03/13/24 0500  CBC with Differential/Platelet  Tomorrow morning,   R        03/12/24 1450   03/13/24 0500  Magnesium   Tomorrow morning,   R        03/12/24 1450   03/13/24 0500  Phosphorus  Tomorrow morning,   R        03/12/24 1450   03/13/24 0500  Protime-INR  Tomorrow morning,   R        03/12/24 1450   03/13/24 0500  Cortisol-am, blood  Tomorrow morning,   R        03/12/24 1450   03/12/24 1426  Respiratory  (~20 pathogens) panel by PCR  (Respiratory panel by PCR (~20 pathogens, ~24 hr TAT)  w precautions)  Add-on,   AD        03/12/24 1425   03/12/24 1425  T4, free  Add-on,   AD        03/12/24 1450   03/12/24 1425  Type and screen  Once,   R        03/12/24 1450   03/12/24 1425  TSH  Add-on,   AD        03/12/24 1450   03/12/24 1258  Culture, body fluid w Gram Stain-bottle  Once,   R        03/12/24 1258   03/12/24 1133  Culture, blood (Routine x 2)  BLOOD CULTURE X 2,   R      03/12/24 1132   03/12/24 1133  Urinalysis, w/ Reflex to Culture (Infection Suspected) -Urine, Clean Catch  Once,   URGENT       Question Answer Comment  Obtain urine by in and out catheter if not obtained within 30 minutes of placing in a treatment room? Yes   Specimen Source Urine, Clean Catch      03/12/24 1132            Meds ordered this encounter  Medications   lidocaine -EPINEPHrine  (XYLOCAINE  W/EPI) 1 %-1:100000 (with pres) injection 20 mL   DISCONTD: albumin  human 25 % solution 12.5 g   albumin  human 25 % solution 12.5 g   lactated ringers  infusion   lactated ringers  bolus 2,000 mL    Reason 30 mL/kg dose is not being ordered:   Other (see comment below)    Comment:   hx cirrhosis may contribute to lactic acid, concern for 3rd spacing.   ceFEPIme  (MAXIPIME ) 2 g in sodium chloride  0.9 % 100 mL  IVPB    Antibiotic Indication::   Other Indication (list below)    Other Indication::   Unknown Source.   metroNIDAZOLE  (FLAGYL ) IVPB 500 mg    Antibiotic Indication::   Other Indication (list below)    Other Indication::   Unknown Source.   DISCONTD: vancomycin  (VANCOCIN ) IVPB 1000 mg/200 mL premix    Indication::   Other Indication (list below)    Other Indication::   Unknown Source.   vancomycin  (VANCOREADY) IVPB 2000 mg/400 mL    Indication::   Other Indication (list below)    Other Indication::   Unknown Source.   ondansetron  (ZOFRAN ) injection 4 mg   magnesium  sulfate IVPB 2 g 50 mL   fentaNYL   (SUBLIMAZE ) injection 50 mcg   LORazepam  (ATIVAN ) injection 1 mg   lactated ringers  bolus 1,000 mL   pantoprazole  (PROTONIX ) injection 40 mg   acetaminophen  (TYLENOL ) tablet 650 mg   pantoprazole  (PROTONIX ) injection 40 mg   FLUoxetine  (PROZAC ) capsule 40 mg   lactulose  (CHRONULAC ) 10 GM/15ML solution 30 g   montelukast  (SINGULAIR ) tablet 10 mg   rOPINIRole  (REQUIP ) tablet 1 mg   rifaximin  (XIFAXAN ) tablet 550 mg   spironolactone  (ALDACTONE ) tablet 50 mg   OR Linked Order Group    acetaminophen  (TYLENOL ) tablet 650 mg    acetaminophen  (TYLENOL ) suppository 650 mg   insulin  aspart (novoLOG ) injection 0-9 Units    Correction coverage::   Sensitive (thin, NPO, renal)    CBG < 70::   Implement Hypoglycemia Standing Orders and refer to Hypoglycemia Standing Orders sidebar report    CBG 70 - 120::   0 units    CBG 121 - 150::   1 unit    CBG 151 - 200::   2 units    CBG 201 - 250::   3 units    CBG 251 - 300::   5 units    CBG 301 - 350::   7 units    CBG 351 - 400:   9 units    CBG > 400:   call MD and obtain STAT lab verification   albumin  human 25 % solution 25 g   DISCONTD: metroNIDAZOLE  (FLAGYL ) IVPB 500 mg    Antibiotic Indication::   Other Indication (list below)    Other Indication::   Unknown Source.   metroNIDAZOLE  (FLAGYL ) IVPB 500 mg    Antibiotic Indication::   Other Indication (list below)    Other Indication::   Unknown Source.     Orders Placed This Encounter  Procedures   Culture, blood (Routine x 2)   Resp panel by RT-PCR (RSV, Flu A&B, Covid) Anterior Nasal Swab   Culture, body fluid w Gram Stain-bottle   Gram stain   Respiratory (~20 pathogens) panel by PCR   IR Paracentesis   DG Chest Port 1 View if patient is in a treatment room.   CT Head Wo Contrast   CBC with Differential   Urinalysis, w/ Reflex to Culture (Infection Suspected) -Urine, Clean Catch   Lactate dehydrogenase (pleural or peritoneal fluid)   Glucose, pleural or peritoneal fluid   Protein,  pleural or peritoneal fluid   Albumin , pleural or peritoneal fluid    Body fluid cell count with differential   Comprehensive metabolic panel with GFR   Protime-INR   Ammonia   Occult blood card to lab, stool RN will collect   T4, free   TSH   Comprehensive metabolic panel with GFR   CBC with Differential/Platelet   Magnesium   Phosphorus   Protime-INR   Cortisol-am, blood   Diet NPO time specified Except for: Sips with Meds   Notify physician (specify)  Specify: Notify provider for possible Code Sepsis   Document height and weight   Insert / maintain saline lock   Assess and Document Glasgow Coma Scale   Document vital signs within 1-hour of fluid bolus completion. Notify provider of abnormal vital signs despite fluid resuscitation.   DO NOT delay antibiotics if unable to obtain blood culture.   Refer to Sidebar Report: Sepsis Sidebar ED/IP   Notify provider for difficulties obtaining IV access.   Insert peripheral IV x 2   Initiate Carrier Fluid Protocol   Apply Diabetes Mellitus Care Plan   STAT CBG when hypoglycemia is suspected. If treated, recheck every 15 minutes after each treatment until CBG >/= 70 mg/dl   Refer to Hypoglycemia Protocol Sidebar Report for treatment of CBG < 70 mg/dl   Vital signs   Notify physician (specify)   Mobility Protocol: No Restrictions   Refer to Sidebar Report Mobility Protocol for Adult Inpatient   If lactate (lactic acid) >2, verify repeat lactic acid order has been placed to be drawn   Document vital signs within 1-hour of fluid bolus completion and notify provider of bolus completion   Vital signs   Vital signs   RN to call RRT (rapid response team)   Notify physician (specify) If patient in A-Fib, change in heart rhythm, or HR > 125 beat/min   Initiate Adult Central Line Maintenance and Catheter Clearance Protocol for patients with central line (CVC, PICC, Port, Hemodialysis, Trialysis)   Apply Sepsis Care Plan   Refer to Sidebar  Report: Sepsis Bundle ED/IP   Assess and Document Glasgow Coma Scale   Initiate Oral Care Protocol   Initiate Carrier Fluid Protocol   RN may order General Admission PRN Orders utilizing General Admission PRN medications (through manage orders) for the following patient needs: allergy symptoms (Claritin), cold sores (Carmex), cough (Robitussin DM), eye irritation (Liquifilm Tears), hemorrhoids (Tucks), indigestion (Maalox), minor skin irritation (Hydrocortisone  Cream), muscle pain (Ben Gay), nose irritation (saline nasal spray) and sore throat (Chloraseptic spray).   Cardiac Monitoring - Continuous Indefinite   Neuro checks   Full code   Consult for [facility] Admission   Pharmacy Consult   CeFEPIme  (MAXIPIME ) per pharmacy consult            vancomycin  per pharmacy consult   Protective Precautions (isolation)   Pulse oximetry check with vital signs   Pulse oximetry (single)   I-Stat Lactic Acid, ED   ED EKG   EKG 12-Lead   EKG 12-Lead   EKG 12-Lead   EKG 12-Lead   EKG 12-Lead   Type and screen   Admit to Inpatient (patient's expected length of stay will be greater than 2 midnights or inpatient only procedure)   Aspiration precautions   Fall precautions    Author: Mario LULLA Blanch, MD 12 pm- 8 pm. [facility]. 03/12/2024 3:13 PM Please note for any communication after hours contact [facility] Assigned provider on call on Amion.

## 2024-03-12 NOTE — Telephone Encounter (Addendum)
 Copied from CRM #[id number]. Topic: Clinical Concerns - High Priority Key Word >> Mar 12, 2024  6:40 PM [facility] wrote: HPKW call;  Followed on call process. If unable to connect; message sent to [facility] Provider pool. [facility] LIVER CARE TRAN [facility] -  Drazek, Dawn >> Mar 12, 2024  6:46 PM Lum wrote: Spouse/ Chanita Boden is calling for clinical concerns (Ask: What symptoms are you calling about today, AND how long have you had these symptoms? Must Review HPKW list for symptoms) Document Name of Triage Nurse/BH Rep taking the call when applicable)   Include all details related to the request(s) below: States patient currently in hospital for scheduled paracentesis. Had tests run & says patient possibly being treated for sepsis. Encounter for apppointment on 05/19/23 states patient currenlty on liver tranplant list. Requesting callback from clinical team.   Confirm and type the Best Contact Number below:  Patient/caller contact number:  [phone]          [] Home  [x] Mobile  [] Work  []  Other   []  Okay to leave a voicemail   Medication List:  Current Outpatient Medications:  .  baclofen (LIORESAL) 10 mg tablet, Take 10 mg by mouth once a week. after paracentesis(Wednesdays), Disp: , Rfl:  .  blood-glucose meter (Accu-Chek Guide Me Glucose Mtr) misc, 1 each., Disp: , Rfl:  .  clotrimazole -betamethasone  (LOTRISONE ) 1-0.05 % cream, Apply 1 Application topically daily as needed. apply as needed, Disp: , Rfl:  .  Dexcom G7 Sensor, 1 each., Disp: , Rfl:  .  ergocalciferol  (VITAMIN D2) 1,250 mcg (50,000 unit) capsule, Take 50,000 Units by mouth once a week. Wednesdays, Disp: , Rfl:  .  ferrous sulfate 325 mg (65 mg iron) tablet, Take 1 tablet (325 mg total) by mouth daily before breakfast., Disp: 30 tablet, Rfl: 11 .  FLUoxetine  (PROzac ) 40 mg capsule, Take 40 mg by mouth daily., Disp: , Rfl:  .  furosemide  (LASIX ) 20 mg tablet, Take 1 tablet (20 mg total) by mouth daily., Disp: 30 tablet, Rfl: 11 .   glucose blood (Accu-Chek Guide test strips) test strip, 1 each by Other route as needed., Disp: , Rfl:  .  HYDROcodone -acetaminophen  (NORCO) 5-325 mg per tablet, Take 1 tablet by mouth every 6 (six) hours as needed for severe pain (7-10) or moderate pain (4-6)., Disp: , Rfl:  .  insulin  regular U-500 CONCENTRATED (HumuLIN  R U-500, Conc, Insulin ) 500 unit/mL injection, Inject under the skin. 65 units before breakfast, 60 units before lunch, 60 before dinner.  If not eating the meal take half the dose., Disp: , Rfl:  .  lactulose  (CHRONULAC ) 10 gram/15 mL solution, Take 30 mL (20 g total) by mouth 2 (two) times a day for 10 days., Disp: 600 mL, Rfl: 0 .  midodrine  (PROAMATINE ) 5 mg tablet, Take 3 tablets (15 mg total) by mouth in the morning and 3 tablets (15 mg total) at noon and 3 tablets (15 mg total) in the evening. Take before meals., Disp: 270 tablet, Rfl: 11 .  montelukast  (SINGULAIR ) 10 mg tablet, Take 10 mg by mouth at bedtime., Disp: , Rfl:  .  nitrofurantoin , macrocrystal-monohydrate, (MACROBID ) 100 mg capsule, Take 100 mg by mouth 2 (two) times a day. (Patient not taking: Reported on 02/21/2024), Disp: , Rfl:  .  omeprazole  (PriLOSEC) 40 mg DR capsule, Take 40 mg by mouth daily., Disp: , Rfl:  .  ondansetron  (ZOFRAN -ODT) 4 mg disintegrating tablet, Dissolve 1 tablet on tongue every 8 (eight) hours as needed.,  Disp: , Rfl:  .  pantoprazole  (PROTONIX ) 40 mg EC tablet, Take 40 mg by mouth 2 (two) times a day. (Patient not taking: Reported on 02/21/2024), Disp: , Rfl:  .  rifAXIMin  (XIFAXAN ) 550 mg tablet, Take 1 tablet (550 mg total) by mouth 2 (two) times a day., Disp: 60 tablet, Rfl: 11 .  rOPINIRole  (REQUIP ) 1 mg tablet, Take 1 mg by mouth at bedtime., Disp: , Rfl:  .  spironolactone  (ALDACTONE ) 25 mg tablet, Take 1 tablet (25 mg total) by mouth daily., Disp: 30 tablet, Rfl: 11 .  temazepam  (RESTORIL ) 30 mg capsule, Take 30 mg by mouth nightly as needed for sleep., Disp: , Rfl:  .  zinc  sulfate (ZINCATE) 220 mg capsule, Take 1 capsule (220 mg total) by mouth 2 (two) times a day., Disp: 60 capsule, Rfl: 11     Medication Request/Refills: Pharmacy Information (if applicable)   [x]  Not Applicable       []  Pharmacy listed  Send Medication Request to:                                                 []  Pharmacy not listed (added to pharmacy list in Epic) Send Medication Request to:      Listed Pharmacies: [id number] [address], [place] - [address] AT [facility] [address] - PHONE: [phone] - FAX: [phone]

## 2024-03-12 NOTE — ED Notes (Signed)
 Patient transported to CT

## 2024-03-12 NOTE — ED Triage Notes (Signed)
 Pt received IR paracentesis this morning. IR team brought pt here after paracentesis for concerns sepsis. IR RN reported 8L of fluid removed this morning. Pt is febrile, tachy, tachypnic, and lethargic. Pt is Aox4 upon arrival.

## 2024-03-12 NOTE — Progress Notes (Signed)
 Pharmacy Antibiotic Note  Laurie Sutton is a 43 y.o. female admitted on 03/12/2024 with sepsis with likely intra-abdominal source.  Pharmacy has been consulted for vancomycin  dosing.  Plan: Vancomycin  1000mg  q12h (eAUC 496, Scr 0.9) F/u renal function, infectious work up, and length of therapy Vancomycin  levels as needed  Height: 5' 6 (167.6 cm) Weight: 83.5 kg (184 lb 1.4 oz) IBW/kg (Calculated) : 59.3  Temp (24hrs), Avg:101.4 F (38.6 C), Min:99.1 F (37.3 C), Max:103.1 F (39.5 C)  Recent Labs  Lab 03/12/24 1135 03/12/24 1141 03/12/24 1221 03/12/24 1321  WBC 1.7*  --   --   --   CREATININE  --   --  0.90  --   LATICACIDVEN  --  5.5*  --  4.1*    Estimated Creatinine Clearance: 87.8 mL/min (by C-G formula based on SCr of 0.9 mg/dL).    Allergies  Allergen Reactions   Morphine  Itching and Rash    Severe    Penicillins Hives    Has patient had a PCN reaction causing immediate rash, facial/tongue/throat swelling, SOB or lightheadedness with hypotension: yes. Rash  Has patient had a PCN reaction causing severe rash involving mucus membranes or skin necrosis: Yes- rash and hives all over body  Has patient had a PCN reaction that required hospitalization No Has patient had a PCN reaction occurring within the last 10 years: Yes  If all of the above answers are NO, then may proceed with Cephalosporin use.    Vitamin K  And Related Other (See Comments)    Electric shock    Latex Itching and Other (See Comments)    IRRITATES VAGINAL AREA   Vancomycin  Itching   Amoxicillin Hives and Itching   Azithromycin  Other (See Comments)    DOES NOT WORK   Dulaglutide  Other (See Comments)    constipation and stomach issues  **Trulicity **    Empagliflozin -Metformin  Hcl Er Swelling and Other (See Comments)   Januvia  [Sitagliptin ] Other (See Comments)    HURTS STOAMCH   Metformin  Other (See Comments)    Other Reaction(s): achy all over   Thank you for allowing pharmacy to be a  part of this patient's care.  Leonor GORMAN Bash 03/12/2024 3:10 PM

## 2024-03-12 NOTE — Progress Notes (Signed)
    Latest Ref Rng & Units 03/12/2024    6:43 PM 03/12/2024   11:35 AM 02/28/2024    4:27 AM  CBC  WBC 4.0 - 10.5 K/uL  1.7  1.8   Hemoglobin 12.0 - 15.0 g/dL 6.9  8.0  7.7   Hematocrit 36.0 - 46.0 % 22.0  25.4  24.5   Platelets 150 - 400 K/uL  57  57     One unit prbc and octreotide  ordered.  D/w Gi on call and agree with plan.

## 2024-03-13 DIAGNOSIS — K7290 Hepatic failure, unspecified without coma: Secondary | ICD-10-CM | POA: Diagnosis not present

## 2024-03-13 DIAGNOSIS — K7460 Unspecified cirrhosis of liver: Secondary | ICD-10-CM | POA: Diagnosis not present

## 2024-03-13 DIAGNOSIS — R188 Other ascites: Secondary | ICD-10-CM | POA: Diagnosis not present

## 2024-03-13 DIAGNOSIS — R5383 Other fatigue: Secondary | ICD-10-CM | POA: Diagnosis not present

## 2024-03-13 LAB — CBC WITH DIFFERENTIAL/PLATELET
Abs Immature Granulocytes: 0.01 K/uL (ref 0.00–0.07)
Basophils Absolute: 0 K/uL (ref 0.0–0.1)
Basophils Relative: 1 %
Eosinophils Absolute: 0 K/uL (ref 0.0–0.5)
Eosinophils Relative: 1 %
HCT: 23.6 % — ABNORMAL LOW (ref 36.0–46.0)
Hemoglobin: 7.6 g/dL — ABNORMAL LOW (ref 12.0–15.0)
Immature Granulocytes: 1 %
Lymphocytes Relative: 10 %
Lymphs Abs: 0.2 K/uL — ABNORMAL LOW (ref 0.7–4.0)
MCH: 28.1 pg (ref 26.0–34.0)
MCHC: 32.2 g/dL (ref 30.0–36.0)
MCV: 87.4 fL (ref 80.0–100.0)
Monocytes Absolute: 0.3 K/uL (ref 0.1–1.0)
Monocytes Relative: 12 %
Neutro Abs: 1.6 K/uL — ABNORMAL LOW (ref 1.7–7.7)
Neutrophils Relative %: 75 %
Platelets: 50 K/uL — ABNORMAL LOW (ref 150–400)
RBC: 2.7 MIL/uL — ABNORMAL LOW (ref 3.87–5.11)
RDW: 18.6 % — ABNORMAL HIGH (ref 11.5–15.5)
Smear Review: NORMAL
WBC: 2.1 K/uL — ABNORMAL LOW (ref 4.0–10.5)
nRBC: 0 % (ref 0.0–0.2)

## 2024-03-13 LAB — COMPREHENSIVE METABOLIC PANEL WITH GFR
ALT: 20 U/L (ref 0–44)
AST: 48 U/L — ABNORMAL HIGH (ref 15–41)
Albumin: 3.2 g/dL — ABNORMAL LOW (ref 3.5–5.0)
Alkaline Phosphatase: 49 U/L (ref 38–126)
Anion gap: 7 (ref 5–15)
BUN: 10 mg/dL (ref 6–20)
CO2: 23 mmol/L (ref 22–32)
Calcium: 8.2 mg/dL — ABNORMAL LOW (ref 8.9–10.3)
Chloride: 101 mmol/L (ref 98–111)
Creatinine, Ser: 0.93 mg/dL (ref 0.44–1.00)
GFR, Estimated: >60 mL/min (ref >60)
Glucose, Bld: 207 mg/dL — ABNORMAL HIGH (ref 70–99)
Potassium: 3.5 mmol/L (ref 3.5–5.1)
Sodium: 131 mmol/L — ABNORMAL LOW (ref 135–145)
Total Bilirubin: 9.4 mg/dL — ABNORMAL HIGH (ref 0.0–1.2)
Total Protein: 5.8 g/dL — ABNORMAL LOW (ref 6.5–8.1)

## 2024-03-13 LAB — LACTIC ACID, PLASMA
Lactic Acid, Venous: 1.4 mmol/L (ref 0.5–1.9)
Lactic Acid, Venous: 2.8 mmol/L (ref 0.5–1.9)

## 2024-03-13 LAB — PHOSPHORUS: Phosphorus: 4.2 mg/dL (ref 2.5–4.6)

## 2024-03-13 LAB — GLUCOSE, CAPILLARY
Glucose-Capillary: 150 mg/dL — ABNORMAL HIGH (ref 70–99)
Glucose-Capillary: 167 mg/dL — ABNORMAL HIGH (ref 70–99)
Glucose-Capillary: 185 mg/dL — ABNORMAL HIGH (ref 70–99)
Glucose-Capillary: 196 mg/dL — ABNORMAL HIGH (ref 70–99)
Glucose-Capillary: 196 mg/dL — ABNORMAL HIGH (ref 70–99)
Glucose-Capillary: 209 mg/dL — ABNORMAL HIGH (ref 70–99)

## 2024-03-13 LAB — PROCALCITONIN: Procalcitonin: 0.15 ng/mL

## 2024-03-13 LAB — MAGNESIUM: Magnesium: 1.9 mg/dL (ref 1.7–2.4)

## 2024-03-13 LAB — PROTIME-INR
INR: 2.5 — ABNORMAL HIGH (ref 0.8–1.2)
Prothrombin Time: 27.9 s — ABNORMAL HIGH (ref 11.4–15.2)

## 2024-03-13 LAB — PATHOLOGIST SMEAR REVIEW

## 2024-03-13 LAB — CORTISOL-AM, BLOOD: Cortisol - AM: 9.6 ug/dL (ref 6.7–22.6)

## 2024-03-13 MED ORDER — LACTULOSE 10 GM/15ML PO SOLN: 20.0000 g | Freq: Two times a day (BID) | ORAL | Status: DC

## 2024-03-13 MED ORDER — VANCOMYCIN HCL IN DEXTROSE 1-5 GM/200ML-% IV SOLN: 1000.0000 mg | Freq: Two times a day (BID) | INTRAVENOUS | Status: DC

## 2024-03-13 MED ORDER — LACTULOSE 10 GM/15ML PO SOLN
30.0000 g | Freq: Three times a day (TID) | ORAL | Status: DC
  Administered 2024-03-14: 30 g via ORAL

## 2024-03-13 MED ORDER — METRONIDAZOLE 500 MG/100ML IV SOLN: 500.0000 mg | Freq: Two times a day (BID) | INTRAVENOUS | Status: AC

## 2024-03-13 MED ORDER — SODIUM CHLORIDE 0.9 % IV SOLN: 50.0000 ug/h | INTRAVENOUS | Status: AC

## 2024-03-13 MED ORDER — INSULIN GLARGINE-YFGN 100 UNIT/ML ~~LOC~~ SOLN: 8.0000 [IU] | Freq: Every day | SUBCUTANEOUS | Status: AC

## 2024-03-13 MED ORDER — ALBUMIN HUMAN 25 % IV SOLN
25.0000 g | Freq: Four times a day (QID) | INTRAVENOUS | Status: DC
  Administered 2024-03-13 – 2024-03-14 (×6): 25 g via INTRAVENOUS
  Filled 2024-03-13 (×6): qty 100

## 2024-03-13 MED ORDER — LACTULOSE 10 GM/15ML PO SOLN
30.0000 g | Freq: Four times a day (QID) | ORAL | Status: AC
  Administered 2024-03-13 (×2): 30 g via ORAL
  Filled 2024-03-13 (×3): qty 60

## 2024-03-13 MED ORDER — OXYCODONE HCL 5 MG PO TABS
2.5000 mg | ORAL_TABLET | Freq: Once | ORAL | Status: AC
  Administered 2024-03-13: 2.5 mg via ORAL
  Filled 2024-03-13: qty 1

## 2024-03-13 MED ORDER — MIDODRINE HCL 5 MG PO TABS
10.0000 mg | ORAL_TABLET | Freq: Three times a day (TID) | ORAL | Status: DC
  Administered 2024-03-13 – 2024-03-14 (×5): 10 mg via ORAL
  Filled 2024-03-13 (×5): qty 2

## 2024-03-13 MED ORDER — MIDODRINE HCL 5 MG PO TABS
5.0000 mg | ORAL_TABLET | Freq: Three times a day (TID) | ORAL | Status: DC
  Administered 2024-03-13 (×2): 5 mg via ORAL
  Filled 2024-03-13 (×2): qty 1

## 2024-03-13 MED ORDER — ALBUMIN HUMAN 25 % IV SOLN: 25.0000 g | Freq: Four times a day (QID) | INTRAVENOUS | Status: AC

## 2024-03-13 MED ORDER — ACETAMINOPHEN 325 MG PO TABS
650.0000 mg | ORAL_TABLET | Freq: Four times a day (QID) | ORAL | Status: AC | PRN
  Administered 2024-03-13 – 2024-03-14 (×2): 650 mg via ORAL
  Filled 2024-03-13 (×2): qty 2

## 2024-03-13 MED ORDER — MIDODRINE HCL 10 MG PO TABS: 10.0000 mg | ORAL_TABLET | Freq: Three times a day (TID) | ORAL | Status: AC

## 2024-03-13 MED ORDER — LACTULOSE 10 GM/15ML PO SOLN
30.0000 g | Freq: Three times a day (TID) | ORAL | Status: DC
  Administered 2024-03-13: 30 g via ORAL
  Filled 2024-03-13: qty 60

## 2024-03-13 MED ORDER — LACTULOSE 10 GM/15ML PO SOLN
20.0000 g | Freq: Once | ORAL | Status: AC
  Administered 2024-03-13: 20 g via ORAL
  Filled 2024-03-13: qty 30

## 2024-03-13 MED ORDER — SPIRONOLACTONE 25 MG PO TABS: 25.0000 mg | ORAL_TABLET | Freq: Every day | ORAL | Status: AC

## 2024-03-13 MED ORDER — SODIUM CHLORIDE 0.9% FLUSH: 10.0000 mL | INTRAVENOUS | Status: DC | PRN

## 2024-03-13 MED ORDER — SODIUM CHLORIDE 0.9 % IV SOLN: 2.0000 g | Freq: Three times a day (TID) | INTRAVENOUS | Status: AC

## 2024-03-13 MED ORDER — PANTOPRAZOLE SODIUM 40 MG IV SOLR: 40.0000 mg | Freq: Two times a day (BID) | INTRAVENOUS | Status: AC

## 2024-03-13 MED ORDER — CHLORHEXIDINE GLUCONATE CLOTH 2 % EX PADS
6.0000 | MEDICATED_PAD | Freq: Every day | CUTANEOUS | Status: DC
  Administered 2024-03-13 – 2024-03-14 (×2): 6 via TOPICAL

## 2024-03-13 MED ORDER — INSULIN ASPART 100 UNIT/ML IJ SOLN: 0.0000 [IU] | INTRAMUSCULAR | Status: AC

## 2024-03-13 MED ORDER — INSULIN GLARGINE-YFGN 100 UNIT/ML ~~LOC~~ SOLN
8.0000 [IU] | Freq: Every day | SUBCUTANEOUS | Status: DC
  Administered 2024-03-13 – 2024-03-14 (×2): 8 [IU] via SUBCUTANEOUS
  Filled 2024-03-13 (×3): qty 0.08

## 2024-03-13 MED ORDER — LACTULOSE 10 GM/15ML PO SOLN: 20.0000 g | Freq: Once | ORAL | Status: DC

## 2024-03-13 NOTE — Progress Notes (Addendum)
 Patient ID: Laurie Sutton, female   DOB: [birth year], 43 y.o.   MRN: [id number]    Progress Note   Subjective   Day # 2 CC; severely decompensated [name] with chronic hepatic encephalopathy, refractory ascites, admitted after outpatient large-volume paracentesis with complaint of illness onset the day before with chills, body aches and increased lethargy.  IV cefepime /vancomycin  Octreotide /midodrine     Lactate 5.5 on admit> normalized INR 2.5/pro time 27.9 WBC 2.1/hemoglobin down to 6.9 last p.m. transfused 1 unit up to 7.6 today/platelets 50 Sodium 131/potassium 3.5/glucose 207/BUN 10/creatinine 0.93 T. bili 9.4/AST 48/ALT 20/alk phos 49 UA positive for protein negative nitrite negative leuk leukocytes Cultures negative so far Respiratory panel negative Peritoneal fluid cell counts negative for SBP  MELD 3.0: 28 at 03/13/2024  3:59 AM MELD-Na: 28 at 03/13/2024  3:59 AM Calculated from: Serum Creatinine: 0.93 mg/dL (Using min of 1 mg/dL) at 87/10/7972  6:40 AM Serum Sodium: 131 mmol/L at 03/13/2024  3:59 AM Total Bilirubin: 9.4 mg/dL at 87/10/7972  6:40 AM Serum Albumin : 3.2 g/dL at 87/10/7972  6:40 AM INR(ratio): 2.5 at 03/13/2024  3:59 AM Age at listing (hypothetical): 43 years Sex: Female at 03/13/2024  3:59 AM   [name] says she feels better today than she did yesterday still generally feeling very poorly, had been very nauseated yesterday, no nausea today but absolutely no appetite Mentation improved     Objective   Vital signs in last 24 hours: Temp:  [98.1 F (36.7 C)-102.3 F (39.1 C)] 99.9 F (37.7 C) (12/02 1144) Pulse Rate:  [78-110] 84 (12/02 1144) Resp:  [18-33] 23 (12/02 1144) BP: (95-131)/(38-70) 119/54 (12/02 1144) SpO2:  [90 %-100 %] 91 % (12/02 1144) Weight:  [83.5 kg] 83.5 kg (12/01 1332) Last BM Date : 03/12/24 General:   Young white female, ill-appearing, jaundiced Heart:  Regular rate and rhythm; no murmurs Lungs: Respirations even and  unlabored, lungs CTA bilaterally Abdomen: Large, nontense ascites, no focal tenderness, bowel sounds are present no palpable mass or hepatosplenomegaly Extremities: No edema Neurologic:  Alert and oriented, no asterixis currently Psych:  Cooperative. Normal mood and affect.  Intake/Output from previous day: 12/01 0701 - 12/02 0700 In: 5046.1 [I.V.:812.9; Blood:464; IV Piggyback:3769.2] Out: 611 [Urine:611] Intake/Output this shift: Total I/O In: 200 [IV Piggyback:200] Out: -   Lab Results: Recent Labs    03/12/24 1135 03/12/24 1843 03/13/24 0359  WBC 1.7*  --  2.1*  HGB 8.0* 6.9* 7.6*  HCT 25.4* 22.0* 23.6*  PLT 57*  --  50*   BMET Recent Labs    03/12/24 1221 03/13/24 0359  NA 131* 131*  K 3.6 3.5  CL 97* 101  CO2 21* 23  GLUCOSE 217* 207*  BUN 13 10  CREATININE 0.90 0.93  CALCIUM  8.4* 8.2*   LFT Recent Labs    03/13/24 0359  PROT 5.8*  ALBUMIN  3.2*  AST 48*  ALT 20  ALKPHOS 49  BILITOT 9.4*   PT/INR Recent Labs    03/12/24 1221 03/13/24 0359  LABPROT 27.0* 27.9*  INR 2.4* 2.5*    Studies/Results: DG Chest Port 1 View Result Date: 03/12/2024 CLINICAL DATA:  Shortness of breath EXAM: PORTABLE CHEST 1 VIEW COMPARISON:  Chest x-ray 03/12/2024 FINDINGS: There central interstitial opacities bilaterally. There is no pleural effusion or pneumothorax. Cardiomediastinal silhouette is within normal limits. No acute fractures are seen. IMPRESSION: Central interstitial opacities bilaterally, which may represent pulmonary edema or atypical infection. Electronically Signed   By: Greig Maple HERO.D.  On: 03/12/2024 23:27   US  EKG SITE RITE Result Date: 03/12/2024 If Site Rite image not attached, placement could not be confirmed due to current cardiac rhythm.  CT Head Wo Contrast Result Date: 03/12/2024 CLINICAL DATA:  Altered mental status EXAM: CT HEAD WITHOUT CONTRAST TECHNIQUE: Contiguous axial images were obtained from the base of the skull through the vertex  without intravenous contrast. RADIATION DOSE REDUCTION: This exam was performed according to the departmental dose-optimization program which includes automated exposure control, adjustment of the mA and/or kV according to patient size and/or use of iterative reconstruction technique. COMPARISON:  10/21/2011 FINDINGS: Brain: No evidence of acute infarction, hemorrhage, hydrocephalus, extra-axial collection or mass lesion/mass effect. Vascular: No hyperdense vessel or unexpected calcification. Skull: Normal. Negative for fracture or focal lesion. Sinuses/Orbits: No acute finding. Other: None. IMPRESSION: No acute intracranial abnormality by noncontrast CT. Electronically Signed   By: CHRISTELLA.  Shick M.D.   On: 03/12/2024 16:39   DG Chest Port 1 View if patient is in a treatment room. Result Date: 03/12/2024 CLINICAL DATA:  Possible sepsis. EXAM: PORTABLE CHEST 1 VIEW COMPARISON:  02/26/2024.  CT chest 10/04/2023. FINDINGS: Trachea is midline. Heart is enlarged. Lungs are somewhat low in volume with minimal streaky atelectasis in the lung bases. No airspace consolidation or pleural fluid. IMPRESSION: Low lung volumes with mild streaky bibasilar atelectasis. Electronically Signed   By: Newell Eke M.D.   On: 03/12/2024 14:01   IR Paracentesis Result Date: 03/12/2024 INDICATION: 43 year old female. History of cirrhosis with recurrent ascites. Request is for therapeutic paracentesis. 8 L maximum EXAM: ULTRASOUND GUIDED THERAPEUTIC LEFT-SIDED PARACENTESIS MEDICATIONS: Lidocaine  1% 10 mL COMPLICATIONS: None immediate. PROCEDURE: Informed written consent was obtained from the patient after a discussion of the risks, benefits and alternatives to treatment. A timeout was performed prior to the initiation of the procedure. Initial ultrasound scanning demonstrates a large amount of ascites within the left lower abdominal quadrant. The left lower abdomen was prepped and draped in the usual sterile fashion. 1% lidocaine  was  used for local anesthesia. Following this, a 19 gauge, 7-cm, Yueh catheter was introduced. An ultrasound image was saved for documentation purposes. The paracentesis was performed. The catheter was removed and a dressing was applied. The patient tolerated the procedure well without immediate post procedural complication. Patient received post-procedure intravenous albumin ; see nursing notes for details. FINDINGS: A total of approximately 8 L of straw-colored fluid was removed. Diagnostic samples held in IR she team a request fluid sampling. IMPRESSION: Successful ultrasound-guided paracentesis yielding 8 liters of peritoneal fluid. Performed by Delon Beagle NP PLAN: Patient currently being worked up for possible transplant [facility] in [place]. Electronically Signed   By: Juliene Balder M.D.   On: 03/12/2024 11:48       Assessment / Plan:    #81 43 year old white female with severely decompensated [name] with gradually progressive chronic hepatic encephalopathy, refractory ascites now requiring paracentesis every 5 to 6 days.  Repeated hospitalizations at least every 10 days over the past couple of months. Admitted yesterday through the emergency room after she had had large-volume paracentesis of 8 L, had been feeling poorly for 24 hours prior to the paracentesis with general malaise, chills, body aches and increased lethargy.  Initial lactate 5.5  Thus far no clear infectious source found Blood cultures negative thus far No evidence for SBP Covering with broad-spectrum antibiotics as above  MELD has been increasing over the past few admissions now up to 28, Increase in bilirubin, INR progressing out  Current MELD-Na 28  #2 diabetes mellitus #3 esophageal varices and portal gastropathy-not having any overt GI bleeding, suspect she has been oozing from the portal gastropathy accounting for decrease in hemoglobin Stable posttransfusion #4 chronic pancytopenia #5 intermittent  ventricular tachycardia/junctional rhythm   Plan; We have contacted [facility] hepatology, patient is high on the active transplant list.  Hoping she can be transferred to [facility] to help expedite transplant as she has had slow progressive decline in rise in her MELD over the past couple of admissions and at this point is having difficulty staying out of the hospital.  Continue Xifaxan  twice daily Continue current doses of lactulose  Continue broad-spectrum antibiotic coverage/cefepime  and vancomycin  Octreotide  had been started okay to continue for now Unable to give vitamin K /allergy Continue midodrine  Continue current doses of Lasix  and Aldactone  GI will continue to follow     Principal Problem:   Lethargy     LOS: 1 day   Amy Esterwood PA-C 03/13/2024, 12:15 PM   Attending physician's note   I personally saw the patient and performed a substantive portion of the medical decision making process for this encounter (including a complete performance of the key components : MDM, Hx and Exam), in conjunction with the APP.  I agree with the APP's note, impression, and  the management plan for the number and complexity of problems addressed at the encounter for the patient and take responsibility for that plan with its inherent risk of complications, morbidity, or mortality with additional input as follows.     43 year old with decompensated MASH cirrhosis MELD 3.0: 28 at 03/13/2024  3:59 AM MELD-Na: 28 at 03/13/2024  3:59 AM   Respiratory panel and all cultures negative to date.  On broad-spectrum antibiotics Negative for SBP  Patient is actively listed for liver transplant  Liver function worse with increasing MELD score, will transfer patient to [facility], patient has potential to get an organ transplant if her MELD continues to go up.  She was accepted by transplant hepatology at [facility] for transfer once bed becomes available  Continue supportive care  I have spent >35 minutes of  patient care (this includes precharting, chart review, review of results, face-to-face time used for counseling as well as treatment plan and follow-up. The patient was provided an opportunity to ask questions and all were answered. The patient agreed with the plan and demonstrated an understanding of the instructions.   LOIS Wilkie Mcgee , MD [phone]

## 2024-03-13 NOTE — Discharge Summary (Signed)
 Physician Discharge Summary  Laurie Sutton FMW:[id number] DOB: Aug 29, 1980 DOA: 03/12/2024  PCP: Default, Provider, MD  Admit date: 03/12/2024 Discharge date: 03/13/2024  Admitted From: (Home) Disposition:  (Transferred to [facility] in [place])  Accepting physician Dr.  Eliezer Daria Colder -discussed with hepatology team at [facility]  Recommendations for Outpatient Follow-up:  Management per [facility]  CODE STATUS: (FULL) Diet recommendation: 2 g sodium  Brief/Interim Summary:  43 y.o. female with medical history significant for end-stage liver disease secondary to MASLD cirrhosis on furosemide , spironolactone , midodrine , rifaximin , history of SBP, hepatic encephalopathy, portal hypertension with esophageal varices, chronic hyponatremia, type 2 diabetes, chronic anxiety/depression, GERD, restless leg syndrome, chronic hypotension on midodrine , patient with multiple recent hospitalization, 12 over last 59-month, just most recently discharged 02/29/2024, she was hospitalized due to to large ascites, with SBP ruled out.  Patient admitted again 03/12/2024 due to fever, lethargy and concern for sepsis, she just had her paracentesis done with 8 L removed, her workup was significant for hyponatremia, hyperglycemia, hyperbilirubinemia, elevated lactic acid at 5.5, hemoglobin at 7.6, started on broad-spectrum antibiotic and admitted for further workup.  SIRS - She presents with fever 103, leukopenia, elevated lactic acid at 5.5 - As of sepsis it is unclear. - SBP ruled out - Blood cultures pending. - UA is negative. - Chest x-ray admission with atelectasis, but later in the day was repeated with bilateral central interstitial opacities, but this appears more due to to volume overload  .  But will encourage use incentive spirometer and flutter valve, likely will need to repeat imaging after she is appropriately diuresed - Continue with broad-spectrum IV antibiotics  vancomycin , cefepime  and Flagyl   Decompensated liver cirrhosis with ascites Portal hypertension Ascites (SBP ruled out) Esophageal varices Hyperammonemia Thrombocytopenia Coagulopathy Hypoalbuminemia Hyperbilirubinemia Chronic hypotension - Management per GI, their input greatly appreciated. - MELD score of 28 - Continue with Xifaxan  - Continue with lactulose , will give extra dose today as she has not received any, monitor ammonia level closely - Monitor INR/PTT closely, cannot give vitamin K  due to allergy - Continue with midodrine , will increase dose to 10 mg 3 times daily - Continue with Lasix  and Aldactone , hold on IV fluids -Continue with edratide for now -No room in her blood pressure for any beta-blockers -Continue with PPI - Patient accepted to transfer to [facility], as she is on their liver transplant list  Acute metabolic encephalopathy -Multifactorial, in the setting of sepsis/infection -Likely hyperammonemia contributing. - CT head with no acute findings  Pancytopenia Thrombocytopenia Anemia -Multifactorial, most likely due to liver cirrhosis and sepsis - No indication for platelet transfusions. - Hemoglobin little less than her baseline, she received 1 unit overnight, improved from 6.9>>7.6, Hemoccult is pending, but she denies any melena or upper GI bleed, hematochezia or coffee-ground emesis  Diabetes mellitus, type II - poorly controlled, will start on 8 units of Semglee , continue with insulin  sliding scale  Restless leg syndrome -continueHome medications  GERD  -Continue with PPI   Discharge Diagnoses:  Principal Problem:   Lethargy    Discharge Instructions   Allergies as of 03/13/2024       Reactions   Morphine  Itching, Rash   Severe    Penicillins Hives   Has patient had a PCN reaction causing immediate rash, facial/tongue/throat swelling, SOB or lightheadedness with hypotension: yes. Rash  Has patient had a PCN reaction causing  severe rash involving mucus membranes or skin necrosis: Yes- rash and hives all over  body  Has patient had a PCN reaction that required hospitalization No Has patient had a PCN reaction occurring within the last 10 years: Yes  If all of the above answers are NO, then may proceed with Cephalosporin use.   Vitamin K  And Related Other (See Comments)   Electric shock    Latex Itching, Other (See Comments)   IRRITATES VAGINAL AREA   Vancomycin  Itching   Amoxicillin Hives, Itching   Azithromycin  Other (See Comments)   DOES NOT WORK   Dulaglutide  Other (See Comments)   constipation and stomach issues  **Trulicity **   Empagliflozin -metformin  Hcl Er Swelling, Other (See Comments)   Januvia  [sitagliptin ] Other (See Comments)   HURTS STOAMCH   Metformin  Other (See Comments)   Other Reaction(s): achy all over        Medication List     STOP taking these medications    baclofen 10 MG tablet Commonly known as: LIORESAL   clotrimazole -betamethasone  cream Commonly known as: LOTRISONE    Dexcom G7 Sensor Misc   ergocalciferol  1.25 MG (50000 UT) capsule Commonly known as: VITAMIN D2   HUMULIN  R 500 UNIT/ML injection Generic drug: insulin  regular human CONCENTRATED   montelukast  10 MG tablet Commonly known as: SINGULAIR    omeprazole  40 MG capsule Commonly known as: PRILOSEC   ondansetron  4 MG disintegrating tablet Commonly known as: ZOFRAN -ODT   ondansetron  4 MG tablet Commonly known as: ZOFRAN    temazepam  30 MG capsule Commonly known as: RESTORIL        TAKE these medications    albumin  human 25 % bottle Inject 100 mLs (25 g total) into the vein every 6 (six) hours.   ceFEPIme  2 g in sodium chloride  0.9 % 100 mL Inject 2 g into the vein every 8 (eight) hours.   FLUoxetine  40 MG capsule Commonly known as: PROZAC  Take 1 capsule (40 mg total) by mouth 2 (two) times daily.   insulin  aspart 100 UNIT/ML injection Commonly known as: novoLOG  Inject 0-9 Units into the  skin every 4 (four) hours.   insulin  glargine-yfgn 100 UNIT/ML injection Commonly known as: SEMGLEE  Inject 0.08 mLs (8 Units total) into the skin daily.   lactulose  10 GM/15ML solution Commonly known as: CHRONULAC  Take 45 mLs (30 g total) by mouth 3 (three) times daily.   metroNIDAZOLE  500 MG/100ML Commonly known as: FLAGYL  Inject 100 mLs (500 mg total) into the vein every 12 (twelve) hours.   midodrine  10 MG tablet Commonly known as: PROAMATINE  Take 1 tablet (10 mg total) by mouth 3 (three) times daily with meals. What changed:  medication strength how much to take when to take this   octreotide  500 mcg in sodium chloride  0.9 % 250 mL Inject 50 mcg/hr into the vein continuous.   pantoprazole  40 MG injection Commonly known as: PROTONIX  Inject 40 mg into the vein every 12 (twelve) hours.   rifaximin  550 MG Tabs tablet Commonly known as: XIFAXAN  Take 550 mg by mouth 2 (two) times daily.   rOPINIRole  1 MG tablet Commonly known as: REQUIP  Take 1 tablet (1 mg total) by mouth at bedtime.   spironolactone  25 MG tablet Commonly known as: ALDACTONE  Take 1 tablet (25 mg total) by mouth daily. Start taking on: March 14, 2024 What changed:  medication strength how much to take   vancomycin  1-5 GM/200ML-% Soln Commonly known as: VANCOCIN  Inject 200 mLs (1,000 mg total) into the vein every 12 (twelve) hours. Start taking on: March 14, 2024        Allergies  Allergen Reactions   Morphine  Itching and Rash    Severe    Penicillins Hives    Has patient had a PCN reaction causing immediate rash, facial/tongue/throat swelling, SOB or lightheadedness with hypotension: yes. Rash  Has patient had a PCN reaction causing severe rash involving mucus membranes or skin necrosis: Yes- rash and hives all over body  Has patient had a PCN reaction that required hospitalization No Has patient had a PCN reaction occurring within the last 10 years: Yes  If all of the above answers are  NO, then may proceed with Cephalosporin use.    Vitamin K  And Related Other (See Comments)    Electric shock    Latex Itching and Other (See Comments)    IRRITATES VAGINAL AREA   Vancomycin  Itching   Amoxicillin Hives and Itching   Azithromycin  Other (See Comments)    DOES NOT WORK   Dulaglutide  Other (See Comments)    constipation and stomach issues  **Trulicity **    Empagliflozin -Metformin  Hcl Er Swelling and Other (See Comments)   Januvia  [Sitagliptin ] Other (See Comments)    HURTS STOAMCH   Metformin  Other (See Comments)    Other Reaction(s): achy all over    Consultations: Gastroenterology   Procedures/Studies: DG Chest Port 1 View Result Date: 03/12/2024 CLINICAL DATA:  Shortness of breath EXAM: PORTABLE CHEST 1 VIEW COMPARISON:  Chest x-ray 03/12/2024 FINDINGS: There central interstitial opacities bilaterally. There is no pleural effusion or pneumothorax. Cardiomediastinal silhouette is within normal limits. No acute fractures are seen. IMPRESSION: Central interstitial opacities bilaterally, which may represent pulmonary edema or atypical infection. Electronically Signed   By: Greig Pique M.D.   On: 03/12/2024 23:27   US  EKG SITE RITE Result Date: 03/12/2024 If Site Rite image not attached, placement could not be confirmed due to current cardiac rhythm.  CT Head Wo Contrast Result Date: 03/12/2024 CLINICAL DATA:  Altered mental status EXAM: CT HEAD WITHOUT CONTRAST TECHNIQUE: Contiguous axial images were obtained from the base of the skull through the vertex without intravenous contrast. RADIATION DOSE REDUCTION: This exam was performed according to the departmental dose-optimization program which includes automated exposure control, adjustment of the mA and/or kV according to patient size and/or use of iterative reconstruction technique. COMPARISON:  10/21/2011 FINDINGS: Brain: No evidence of acute infarction, hemorrhage, hydrocephalus, extra-axial collection or mass  lesion/mass effect. Vascular: No hyperdense vessel or unexpected calcification. Skull: Normal. Negative for fracture or focal lesion. Sinuses/Orbits: No acute finding. Other: None. IMPRESSION: No acute intracranial abnormality by noncontrast CT. Electronically Signed   By: CHRISTELLA.  Shick M.D.   On: 03/12/2024 16:39   DG Chest Port 1 View if patient is in a treatment room. Result Date: 03/12/2024 CLINICAL DATA:  Possible sepsis. EXAM: PORTABLE CHEST 1 VIEW COMPARISON:  02/26/2024.  CT chest 10/04/2023. FINDINGS: Trachea is midline. Heart is enlarged. Lungs are somewhat low in volume with minimal streaky atelectasis in the lung bases. No airspace consolidation or pleural fluid. IMPRESSION: Low lung volumes with mild streaky bibasilar atelectasis. Electronically Signed   By: Newell Eke M.D.   On: 03/12/2024 14:01   IR Paracentesis Result Date: 03/12/2024 INDICATION: 43 year old female. History of cirrhosis with recurrent ascites. Request is for therapeutic paracentesis. 8 L maximum EXAM: ULTRASOUND GUIDED THERAPEUTIC LEFT-SIDED PARACENTESIS MEDICATIONS: Lidocaine  1% 10 mL COMPLICATIONS: None immediate. PROCEDURE: Informed written consent was obtained from the patient after a discussion of the risks, benefits and alternatives to treatment. A timeout was performed prior to the initiation of the procedure. Initial  ultrasound scanning demonstrates a large amount of ascites within the left lower abdominal quadrant. The left lower abdomen was prepped and draped in the usual sterile fashion. 1% lidocaine  was used for local anesthesia. Following this, a 19 gauge, 7-cm, Yueh catheter was introduced. An ultrasound image was saved for documentation purposes. The paracentesis was performed. The catheter was removed and a dressing was applied. The patient tolerated the procedure well without immediate post procedural complication. Patient received post-procedure intravenous albumin ; see nursing notes for details. FINDINGS: A  total of approximately 8 L of straw-colored fluid was removed. Diagnostic samples held in IR she team a request fluid sampling. IMPRESSION: Successful ultrasound-guided paracentesis yielding 8 liters of peritoneal fluid. Performed by Delon Beagle NP PLAN: Patient currently being worked up for possible transplant [facility] in [place]. Electronically Signed   By: Juliene Balder M.D.   On: 03/12/2024 11:48   IR Paracentesis Result Date: 03/06/2024 INDICATION: Patient with history of cirrhosis with recurrent ascites. Interventional Radiology asked to perform a therapeutic paracentesis with an 8 L max. EXAM: ULTRASOUND GUIDED  PARACENTESIS MEDICATIONS: 1% lidocaine  15 mL COMPLICATIONS: None immediate. PROCEDURE: Informed written consent was obtained from the patient after a discussion of the risks, benefits and alternatives to treatment. A timeout was performed prior to the initiation of the procedure. Initial ultrasound scanning demonstrates a large amount of ascites within the left lower abdominal quadrant. The left lower abdomen was prepped and draped in the usual sterile fashion. 1% lidocaine  was used for local anesthesia. Following this, a 19 gauge, 7-cm, Yueh catheter was introduced. An ultrasound image was saved for documentation purposes. The paracentesis was performed. After approximately 4 L the catheter stopped draining and ultrasound imaging showed a residual large volume of ascites. A new area was marked and the site was prepped and draped in the usual sterile fashion. 1% lidocaine  was used for local anesthesia. A 19 gauge 7 cm Yueh was introduced and the paracentesis was performed. The catheter was removed and a dressing was applied. The patient tolerated the procedure well without immediate post procedural complication. Patient received post-procedure intravenous albumin ; see nursing notes for details. FINDINGS: A total of approximately 8 L of clear yellow fluid was removed. IMPRESSION: Successful  ultrasound-guided paracentesis yielding 8 liters of peritoneal fluid. Procedure performed by: Warren Dais, NP PLAN: Patient is currently on the liver transplant waiting list with [facility]. Electronically Signed   By: Ester Sides M.D.   On: 03/06/2024 15:54   IR Paracentesis Result Date: 02/29/2024 INDICATION: 43 year old female with a history of NASH cirrhosis with recurrent ascites. Request for diagnostic and therapeutic paracentesis. EXAM: ULTRASOUND GUIDED DIAGNOSTIC AND THERAPEUTIC, LEFT-SIDED PARACENTESIS MEDICATIONS: 1% lidocaine , 8 mL. COMPLICATIONS: None immediate. PROCEDURE: Informed written consent was obtained from the patient after a discussion of the risks, benefits and alternatives to treatment. A timeout was performed prior to the initiation of the procedure. Initial ultrasound scanning demonstrates a large amount of ascites within the left lower abdominal quadrant. The left lower abdomen was prepped and draped in the usual sterile fashion. 1% lidocaine  was used for local anesthesia. Following this, a 19 gauge, 7-cm, Yueh catheter was introduced. An ultrasound image was saved for documentation purposes. The paracentesis was performed. The catheter was removed and a dressing was applied. The patient tolerated the procedure well without immediate post procedural complication. Patient received post-procedure intravenous albumin ; see nursing notes for details. FINDINGS: A total of approximately 7.4 L of dark yellow fluid was removed. Samples were sent to the laboratory  as requested by the clinical team. IMPRESSION: Successful ultrasound-guided paracentesis yielding 7.4 liters of peritoneal fluid. PLAN: Patient currently being worked up for possible transplant at [facility]. Procedure performed by: Sherrilee Bal, PA-C under the supervision of Dr. ONEIDA Specking Electronically Signed   By: CHRISTELLA.  Shick M.D.   On: 02/29/2024 10:35   IR Paracentesis Result Date: 02/27/2024 INDICATION: 43 year old  female. History of NASH cirrhosis with recurrent ascites. Request for therapeutic paracentesis EXAM: ULTRASOUND GUIDED LEFT-SIDED THERAPEUTIC PARACENTESIS MEDICATIONS: Hip lidocaine  1%.  10 mL COMPLICATIONS: None immediate. PROCEDURE: Informed written consent was obtained from the patient after a discussion of the risks, benefits and alternatives to treatment. A timeout was performed prior to the initiation of the procedure. Initial ultrasound scanning demonstrates a large amount of ascites within the left lower abdominal quadrant. The left lower abdomen was prepped and draped in the usual sterile fashion. 1% lidocaine  was used for local anesthesia. Following this, a 19 gauge, 7-cm, Yueh catheter was introduced. An ultrasound image was saved for documentation purposes. The paracentesis was performed. The catheter was removed and a dressing was applied. The patient tolerated the procedure well without immediate post procedural complication. SABRA FINDINGS: A total of approximately 4 L of straw-colored fluid was removed. IMPRESSION: Successful ultrasound-guided left-sided therapeutic paracentesis yielding 4 liters of peritoneal fluid. Performed by Delon Beagle NP PLAN: Patient currently being worked up for possible transplant at [facility]. Electronically Signed   By: Cordella Banner   On: 02/27/2024 15:55   DG Chest Portable 1 View Result Date: 02/26/2024 EXAM: 1 VIEW(S) XRAY OF THE CHEST 02/26/2024 02:36:00 AM COMPARISON: None available. CLINICAL HISTORY: eval for cough FINDINGS: LUNGS AND PLEURA: Pulmonary hypoinflation. Left basilar atelectasis. No pleural effusion. No pneumothorax. HEART AND MEDIASTINUM: No acute abnormality of the cardiac and mediastinal silhouettes. BONES AND SOFT TISSUES: No acute osseous abnormality. IMPRESSION: 1. Pulmonary hypoinflation and left basilar atelectasis. Electronically signed by: Dorethia Molt MD 02/26/2024 02:48 AM EST RP Workstation: [id number]   IR  Paracentesis Result Date: 02/20/2024 INDICATION: 43 year old female. History of NASH cirrhosis with recurrent ascites. Request is for therapeutic and diagnostic paracentesis EXAM: ULTRASOUND GUIDED THERAPEUTIC DIAGNOSTIC LEFT-SIDED PARACENTESIS MEDICATIONS: Lidocaine  1% 10 mL COMPLICATIONS: None immediate. PROCEDURE: Informed written consent was obtained from the patient after a discussion of the risks, benefits and alternatives to treatment. A timeout was performed prior to the initiation of the procedure. Initial ultrasound scanning demonstrates a large amount of ascites within the left lower abdominal quadrant. The left lower abdomen was prepped and draped in the usual sterile fashion. 1% lidocaine  was used for local anesthesia. Following this, a 19 gauge, 7-cm, Yueh catheter was introduced. An ultrasound image was saved for documentation purposes. The paracentesis was performed. The catheter was removed and a dressing was applied. The patient tolerated the procedure well without immediate post procedural complication. Patient received post-procedure intravenous albumin ; see nursing notes for details. FINDINGS: A total of approximately 8 L of straw-colored fluid was removed. Samples were sent to the laboratory as requested by the clinical team. IMPRESSION: Successful ultrasound-guided therapeutic and diagnostic left-sided paracentesis yielding 8 liters of peritoneal fluid. Performed by Delon Beagle NP PLAN: Patient is currently being worked up for transplant at [facility] Electronically Signed   By: Cordella Banner   On: 02/20/2024 17:08   US  Abdomen Limited RUQ (LIVER/GB) Result Date: 02/19/2024 EXAM: Right Upper Quadrant Abdominal Ultrasound 02/19/2024 05:14:00 PM TECHNIQUE: Real-time ultrasonography of the right upper quadrant of the abdomen was performed. COMPARISON: CTA  abdomen and pelvis 02/15/2024. CLINICAL HISTORY: RUQ pain. FINDINGS: LIVER: The liver is mildly enlarged and  diffusely heterogeneous with decreased echotexture. There is nodular liver contour compatible with cirrhosis. No intrahepatic biliary ductal dilatation. No evidence of mass. BILIARY SYSTEM: Gallbladder sludge is present. A single gallbladder polyp is present measuring 6.6 mm. Gallbladder wall thickness is upper limits of normal at 3 mm. No pericholecystic fluid. No cholelithiasis. Negative sonographic Murphy's sign. Common bile duct measures 1.6 mm. RIGHT KIDNEY: The right kidney is grossly unremarkable in appearances without evidence of hydronephrosis, echogenic calculi or worrisome mass lesions. PANCREAS: Visualized portions of the pancreas are unremarkable. OTHER: There is a large amount of ascites in the right upper quadrant. IMPRESSION: 1. Large amount of ascites in the right upper quadrant. 2. Mildly enlarged liver with nodular contour compatible with cirrhosis. 3. Gallbladder sludge and a single 6.6 mm polyp. Consider ultrasound follow-up in 12 months to assess for growth per common practice for small gallbladder polyps. Electronically signed by: Greig Pique MD 02/19/2024 05:47 PM EST RP Workstation: [id number]   DG Chest Port 1 View Result Date: 02/19/2024 EXAM: 1 VIEW(S) XRAY OF THE CHEST 02/19/2024 01:12:00 PM COMPARISON: 01/31/2024 CLINICAL HISTORY: SOB FINDINGS: LUNGS AND PLEURA: Low lung volumes with vascular crowding and bibasilar subsegmental atelectasis. No pulmonary edema. No pleural effusion. No pneumothorax. HEART AND MEDIASTINUM: Multiple wires and leads project over the chest on the frontal radiograph. No acute abnormality of the cardiac and mediastinal silhouettes. BONES AND SOFT TISSUES: No acute osseous abnormality. IMPRESSION: 1. Persistent low lung volumes with vascular crowding and bibasilar subsegmental atelectasis, unchanged from 01/31/2024. Electronically signed by: Rockey Kilts MD 02/19/2024 01:45 PM EST RP Workstation: [id number]   CT Angio Abd/Pel W and/or Wo Contrast Result  Date: 02/16/2024 EXAM: CTA ABDOMEN AND PELVIS WITHOUT AND WITH CONTRAST 02/15/2024 11:38:08 PM TECHNIQUE: CTA images of the abdomen and pelvis without and with intravenous contrast. 75 mL (iohexol  (OMNIPAQUE ) 350 MG/ML injection 75 mL IOHEXOL  350 MG/ML SOLN) was administered. Three-dimensional MIP/volume rendered formations were performed. Automated exposure control, iterative reconstruction, and/or weight based adjustment of the mA/kV was utilized to reduce the radiation dose to as low as reasonably achievable. COMPARISON: CT abdomen and pelvis 02/10/1999 and 02/04/2024. CLINICAL HISTORY: upper GI bleed FINDINGS: VASCULATURE: AORTA: No acute finding. No abdominal aortic aneurysm. No dissection. CELIAC TRUNK: No acute finding. No occlusion or significant stenosis. SUPERIOR MESENTERIC ARTERY: No acute finding. No occlusion or significant stenosis. RENAL ARTERIES: No acute finding. No occlusion or significant stenosis. ILIAC ARTERIES: No acute finding. No occlusion or significant stenosis. *Additional vascular findings:* Portal vein is enlarged. Splenic varices are present. There is recanalization of the umbilical vein. LIVER: Nodular liver contour and diffuse hepatic heterogeneity is again seen compatible with cirrhosis. GALLBLADDER AND BILE DUCTS: There is gallbladder wall edema. Small gallstones are present. No biliary ductal dilatation. SPLEEN: The spleen is moderately enlarged and unchanged. PANCREAS: The pancreas is unremarkable. ADRENAL GLANDS: Bilateral adrenal glands demonstrate no acute abnormality. KIDNEYS, URETERS AND BLADDER: Previous subcentimeter cyst in the superior pole and inferior pole of the left kidney. No stones in the kidneys or ureters. No hydronephrosis. No perinephric or periureteral stranding. Urinary bladder is unremarkable. GI AND BOWEL: Stomach and duodenal sweep demonstrate no acute abnormality. There is diffuse colonic wall thickening/edema, more significant in the ascending colon. The  appendix is normal. There is no active gastrointestinal bleeding identified. There is wall thickening and mesenteric edema involving jejunal loops. There is no bowel obstruction. No abnormal bowel distension.  REPRODUCTIVE: Uterus is surgically absent. PERITONEUM AND RETROPERITONEUM: Moderate-to-large amount of ascites appears unchanged. No free air. LUNG BASE: There is atelectasis in the left lung base. LYMPH NODES: No lymphadenopathy. BONES AND SOFT TISSUES: No acute abnormality of the bones. Diffuse body wall edema has increased. IMPRESSION: 1. No active gastrointestinal bleeding identified. 2. Cirrhosis with nodular liver contour, diffuse hepatic heterogeneity, portal vein enlargement, splenic varices, and recanalized umbilical vein, compatible with portal hypertension. 3. Moderate-to-large ascites, unchanged, with increased diffuse body wall edema. 4. Gallbladder wall edema with small gallstones, which may reflect cholecystitis versus third-spacing; correlate clinically. 5. Diffuse colonic wall thickening/edema, greatest in the ascending colon, and jejunal wall thickening with mesenteric edema, which may reflect portal hypertensive enterocolopathy and/or superimposed infectious/inflammatory enterocolitis; correlate clinically. 6. Moderate splenomegaly, unchanged. Electronically signed by: Greig Pique MD 02/16/2024 12:06 AM EST RP Workstation: [id number]   IR Paracentesis Result Date: 02/15/2024 INDICATION: History of NASH cirrhosis with recurrent ascites. Request for therapeutic paracentesis. EXAM: ULTRASOUND GUIDED THERAPEUTIC PARACENTESIS MEDICATIONS: 10 mL 1% lidocaine  COMPLICATIONS: None immediate. PROCEDURE: Informed written consent was obtained from the patient after a discussion of the risks, benefits and alternatives to treatment. A timeout was performed prior to the initiation of the procedure. Initial ultrasound scanning demonstrates a moderate amount of ascites within the left lower abdominal  quadrant. The left lower abdomen was prepped and draped in the usual sterile fashion. 1% lidocaine  was used for local anesthesia. Following this, a 19 gauge, 7-cm, Yueh catheter was introduced. An ultrasound image was saved for documentation purposes. The paracentesis was performed. The catheter was removed and a dressing was applied. The patient tolerated the procedure well without immediate post procedural complication. Patient received post-procedure intravenous albumin ; see nursing notes for details. FINDINGS: A total of approximately 7.4 liters of slightly hazy yellow fluid was removed. IMPRESSION: Successful ultrasound-guided paracentesis yielding 7.4 liters liters of peritoneal fluid. Performed by: Kimble Clas, PA-C Electronically Signed   By: Cordella Banner   On: 02/15/2024 11:24      Subjective:  She was with dyspnea and increased work of breathing overnight, improved after receiving Lasix , no bowel movements yesterday  Discharge Exam: Vitals:   03/13/24 0900 03/13/24 1144  BP: 100/70 (!) 119/54  Pulse: 87 84  Resp: 20 (!) 23  Temp: 99.3 F (37.4 C) 99.9 F (37.7 C)  SpO2: 96% 91%   Vitals:   03/13/24 0700 03/13/24 0746 03/13/24 0900 03/13/24 1144  BP: (!) 104/55 (!) 95/49 100/70 (!) 119/54  Pulse: 80 82 87 84  Resp: (!) 27 (!) 23 20 (!) 23  Temp:  100.1 F (37.8 C) 99.3 F (37.4 C) 99.9 F (37.7 C)  TempSrc:  Oral Oral Oral  SpO2: 93% 95% 96% 91%  Weight:      Height:        General: Somnolent, but wakes up, alert, oriented x 3, appropriate, but ill-appearing, frail, deconditioned, jaundiced Cardiovascular: RRR, S1/S2 +, no rubs, no gallops Respiratory: Good air entry bilaterally, with rhonchi Abdominal: Soft, NT, ND, bowel sounds + Extremities: no edema, no cyanosis    The results of significant diagnostics from this hospitalization (including imaging, microbiology, ancillary and laboratory) are listed below for reference.     Microbiology: Recent Results  (from the past 240 hours)  Culture, blood (Routine x 2)     Status: None (Preliminary result)   Collection Time: 03/12/24 11:35 AM   Specimen: BLOOD LEFT ARM  Result Value Ref Range Status   Specimen Description BLOOD LEFT ARM  Final   Special Requests   Final    BOTTLES DRAWN AEROBIC AND ANAEROBIC Blood Culture results may not be optimal due to an inadequate volume of blood received in culture bottles   Culture   Final    NO GROWTH < 24 HOURS Performed at [facility], [address], [place], [place] [address]    Report Status PENDING  Incomplete  Culture, blood (Routine x 2)     Status: None (Preliminary result)   Collection Time: 03/12/24 11:42 AM   Specimen: BLOOD LEFT ARM  Result Value Ref Range Status   Specimen Description BLOOD LEFT ARM  Final   Special Requests   Final    BOTTLES DRAWN AEROBIC AND ANAEROBIC Blood Culture results may not be optimal due to an inadequate volume of blood received in culture bottles   Culture   Final    NO GROWTH < 24 HOURS Performed at [facility], [address], [place], [place] [address]    Report Status PENDING  Incomplete  Resp panel by RT-PCR (RSV, Flu A&B, Covid) Anterior Nasal Swab     Status: None   Collection Time: 03/12/24 12:05 PM   Specimen: Anterior Nasal Swab  Result Value Ref Range Status   SARS Coronavirus 2 by RT PCR NEGATIVE NEGATIVE Final   Influenza A by PCR NEGATIVE NEGATIVE Final   Influenza B by PCR NEGATIVE NEGATIVE Final    Comment: (NOTE) The Xpert Xpress SARS-CoV-2/FLU/RSV plus assay is intended as an aid in the diagnosis of influenza from Nasopharyngeal swab specimens and should not be used as a sole basis for treatment. Nasal washings and aspirates are unacceptable for Xpert Xpress SARS-CoV-2/FLU/RSV testing.  Fact Sheet for Patients: bloggercourse.com  Fact Sheet for Healthcare Providers: seriousbroker.it  This test is not yet approved or  cleared by the [place]  FDA and has been authorized for detection and/or diagnosis of SARS-CoV-2 by FDA under an Emergency Use Authorization (EUA). This EUA will remain in effect (meaning this test can be used) for the duration of the COVID-19 declaration under Section 564(b)(1) of the Act, 21 U.S.C. section 360bbb-3(b)(1), unless the authorization is terminated or revoked.     Resp Syncytial Virus by PCR NEGATIVE NEGATIVE Final    Comment: (NOTE) Fact Sheet for Patients: bloggercourse.com  Fact Sheet for Healthcare Providers: seriousbroker.it  This test is not yet approved or cleared by the [place]  FDA and has been authorized for detection and/or diagnosis of SARS-CoV-2 by FDA under an Emergency Use Authorization (EUA). This EUA will remain in effect (meaning this test can be used) for the duration of the COVID-19 declaration under Section 564(b)(1) of the Act, 21 U.S.C. section 360bbb-3(b)(1), unless the authorization is terminated or revoked.  Performed at [facility], [address], [place], [place] [address]   Culture, body fluid w Gram Stain-bottle     Status: None (Preliminary result)   Collection Time: 03/12/24 12:58 PM   Specimen: Peritoneal Washings  Result Value Ref Range Status   Specimen Description PERITONEAL  Final   Special Requests NONE  Final   Culture   Final    NO GROWTH < 24 HOURS Performed at [facility], [address], [place], [place] [address]    Report Status PENDING  Incomplete  Gram stain     Status: None   Collection Time: 03/12/24 12:58 PM   Specimen: Peritoneal Washings  Result Value Ref Range Status   Specimen Description PERITONEAL  Final   Special Requests NONE  Final   Gram Stain   Final    WBC PRESENT, PREDOMINANTLY MONONUCLEAR NO ORGANISMS SEEN CYTOSPIN SMEAR Performed at [facility], [address], [place], [place] [address]    Report Status  03/12/2024 FINAL  Final     Labs: BNP (last 3 results) Recent Labs    12/15/23 0354  BNP 15.8   Basic Metabolic Panel: Recent Labs  Lab 03/12/24 1221 03/12/24 2124 03/13/24 0359  NA 131*  --  131*  K 3.6  --  3.5  CL 97*  --  101  CO2 21*  --  23  GLUCOSE 217*  --  207*  BUN 13  --  10  CREATININE 0.90  --  0.93  CALCIUM  8.4*  --  8.2*  MG  --  1.9 1.9  PHOS  --   --  4.2   Liver Function Tests: Recent Labs  Lab 03/12/24 1221 03/13/24 0359  AST 46* 48*  ALT 23 20  ALKPHOS 58 49  BILITOT 5.3* 9.4*  PROT 6.0* 5.8*  ALBUMIN  3.3* 3.2*   No results for input(s): LIPASE, AMYLASE in the last 168 hours. Recent Labs  Lab 03/12/24 1316  AMMONIA 93*   CBC: Recent Labs  Lab 03/12/24 1135 03/12/24 1843 03/13/24 0359  WBC 1.7*  --  2.1*  NEUTROABS 1.4*  --  1.6*  HGB 8.0* 6.9* 7.6*  HCT 25.4* 22.0* 23.6*  MCV 88.8  --  87.4  PLT 57*  --  50*   Cardiac Enzymes: No results for input(s): CKTOTAL, CKMB, CKMBINDEX, TROPONINI in the last 168 hours. BNP: Invalid input(s): POCBNP CBG: Recent Labs  Lab 03/12/24 2009 03/13/24 0009 03/13/24 0418 03/13/24 0744 03/13/24 1143  GLUCAP 217* 196* 209* 167* 150*   D-Dimer No results for input(s): DDIMER in the last 72 hours. Hgb A1c No results for input(s): HGBA1C in the last 72 hours. Lipid Profile No results for input(s): CHOL, HDL, LDLCALC, TRIG, CHOLHDL, LDLDIRECT in the last 72 hours. Thyroid  function studies Recent Labs    03/12/24 1221  TSH 0.178*   Anemia work up No results for input(s): VITAMINB12, FOLATE, FERRITIN, TIBC, IRON, RETICCTPCT in the last 72 hours. Urinalysis    Component Value Date/Time   COLORURINE AMBER (A) 03/12/2024 1500   APPEARANCEUR HAZY (A) 03/12/2024 1500   LABSPEC 1.022 03/12/2024 1500   PHURINE 5.0 03/12/2024 1500   GLUCOSEU 50 (A) 03/12/2024 1500   HGBUR MODERATE (A) 03/12/2024 1500   BILIRUBINUR NEGATIVE 03/12/2024 1500    BILIRUBINUR 1+ 02/13/2024 1151   KETONESUR NEGATIVE 03/12/2024 1500   PROTEINUR >=300 (A) 03/12/2024 1500   UROBILINOGEN 1.0 02/13/2024 1151   UROBILINOGEN 1.0 12/21/2014 1020   NITRITE NEGATIVE 03/12/2024 1500   LEUKOCYTESUR NEGATIVE 03/12/2024 1500   Sepsis Labs Recent Labs  Lab 03/12/24 1135 03/13/24 0359  WBC 1.7* 2.1*   Microbiology Recent Results (from the past 240 hours)  Culture, blood (Routine x 2)     Status: None (Preliminary result)   Collection Time: 03/12/24 11:35 AM   Specimen: BLOOD LEFT ARM  Result Value Ref Range Status   Specimen Description BLOOD LEFT ARM  Final   Special Requests   Final    BOTTLES DRAWN AEROBIC AND ANAEROBIC Blood Culture results may not be optimal due to an inadequate volume of blood received in culture bottles   Culture   Final    NO GROWTH < 24 HOURS Performed at [facility], [address], [place], [place] [address]    Report Status PENDING  Incomplete  Culture, blood (Routine x 2)     Status: None (Preliminary result)   Collection Time: 03/12/24 11:42 AM   Specimen: BLOOD LEFT ARM  Result Value Ref Range Status   Specimen Description BLOOD LEFT ARM  Final   Special Requests   Final    BOTTLES DRAWN AEROBIC AND ANAEROBIC Blood Culture results may not be optimal due to an inadequate volume of blood received in culture bottles   Culture   Final    NO GROWTH < 24 HOURS Performed at [facility], [address], [place], [place] [address]    Report Status PENDING  Incomplete  Resp panel by RT-PCR (RSV, Flu A&B, Covid) Anterior Nasal Swab     Status: None   Collection Time: 03/12/24 12:05 PM   Specimen: Anterior Nasal Swab  Result Value Ref Range Status   SARS Coronavirus 2 by RT PCR NEGATIVE NEGATIVE Final   Influenza A by PCR NEGATIVE NEGATIVE Final   Influenza B by PCR NEGATIVE NEGATIVE Final    Comment: (NOTE) The Xpert Xpress SARS-CoV-2/FLU/RSV plus assay is intended as an aid in the diagnosis of influenza from  Nasopharyngeal swab specimens and should not be used as a sole basis for treatment. Nasal washings and aspirates are unacceptable for Xpert Xpress SARS-CoV-2/FLU/RSV testing.  Fact Sheet for Patients: bloggercourse.com  Fact Sheet for Healthcare Providers: seriousbroker.it  This test is not yet approved or cleared by the [place]  FDA and has been authorized for detection and/or diagnosis of SARS-CoV-2 by FDA under an Emergency Use Authorization (EUA). This EUA will remain in effect (meaning this test can be used) for the duration of the COVID-19 declaration under Section 564(b)(1) of the Act, 21 U.S.C. section 360bbb-3(b)(1), unless the authorization is terminated or revoked.     Resp Syncytial Virus by PCR NEGATIVE NEGATIVE Final    Comment: (NOTE) Fact Sheet for Patients: bloggercourse.com  Fact Sheet for Healthcare Providers: seriousbroker.it  This test is not yet approved or cleared by the [place]  FDA and has been authorized for detection and/or diagnosis of SARS-CoV-2 by FDA under an Emergency Use Authorization (EUA). This EUA will remain in effect (meaning this test can be used) for the duration of the COVID-19 declaration under Section 564(b)(1) of the Act, 21 U.S.C. section 360bbb-3(b)(1), unless the authorization is terminated or revoked.  Performed at [facility], [address] [place], [place] [address]   Culture, body fluid w Gram Stain-bottle     Status: None (Preliminary result)   Collection Time: 03/12/24 12:58 PM   Specimen: Peritoneal Washings  Result Value Ref Range Status   Specimen Description PERITONEAL  Final   Special Requests NONE  Final   Culture   Final    NO GROWTH < 24 HOURS Performed at [facility], [address] [place], [place] [address]    Report Status PENDING  Incomplete  Gram stain     Status: None    Collection Time: 03/12/24 12:58 PM   Specimen: Peritoneal Washings  Result Value Ref Range Status   Specimen Description PERITONEAL  Final   Special Requests NONE  Final   Gram Stain   Final    WBC PRESENT, PREDOMINANTLY MONONUCLEAR NO ORGANISMS SEEN CYTOSPIN SMEAR Performed at [facility], [address], [place], [place] [address]    Report Status 03/12/2024 FINAL  Final  Time coordinating discharge: Over 30 minutes  SIGNED:   Brayton Lye, MD  [facility] 03/13/2024, 2:06 PM Pager   If 7PM-7AM, please contact night-coverage [email or web address]

## 2024-03-13 NOTE — Plan of Care (Signed)

## 2024-03-13 NOTE — Progress Notes (Signed)
 [facility]  PCL Liver Transfer  [phone]; Dr. Brayton @ [facility] In[facility]. Dx: liver failure   Patient Name:  Laurie Sutton 43 y/o f hx decomp MASLD cirrhosis complicated by HE , ascites , nonbleeding  varices  listed for transplant  BG O  Fever 103  para 8 liters  confused  no SBP  BP low on albumin  octretide MAP 60  Fever unknown   Lactic acid 2.6   was 4.1  No signs of GI blood loss  H/h improved  with 1 u  MELD  5 tid midodrine    Paracentesis  yesterday  CXR negative  UA negative blood cultures negatrive  No SBP   Age:  43 y.o.  Liver Disease: MASLD   Labs: Tbili  3.6  4  ALT 22 AST 49  Alkphos 94  INR 1.9  2.5  Na 128  Cr 0.50  MELD 3.0 and MELD-Na:    MELD 3.0: 24 at 02/21/2024  6:01 PM MELD-Na: 25 at 02/21/2024  6:01 PM Calculated from: Serum Creatinine: 0.5 mg/dL (Using min of 1 mg/dL) at 88/88/7974  3:98 PM Serum Sodium: 128 mmol/L at 02/21/2024  6:01 PM Total Bilirubin: 3.6 mg/dL at 88/88/7974  3:98 PM Serum Albumin : 3.1 g/dL at 88/88/7974  3:98 PM INR(ratio): 1.9 at 02/21/2024  6:01 PM Age at listing: 32 years Sex: Female at 02/21/2024  6:01 PM   Blood Group:   O Positive  Bed status:  Floor  Pressor requirements: No  Active infection:  Yes   Ambulation/Physical status present/recent:  BMI:  There is no height or weight on file to calculate BMI.  Imaging:  Order for outside radiology studies to be pushed over:  No   Insurance:   @[email or web address] @ Request face sheet /insurance fax to [phone] for insurance transplant benetfit auth.   Social Support:   Social History:  [facility] Rehab DUIs IV drug use  Reason for Transfer/Hepatology Plan of Care:  Pt listed for transplant with  fever unknown origin not on pressor support  On midodrin 5 tid  Transfused 1 u  and H/H stable   Ok to transfer to  [facility]  CH[facility] to admit and hepatology to consult given listed pt with rising MELD      Transplant status:  On the waiting list

## 2024-03-13 NOTE — Progress Notes (Signed)
 Meld 28 updated unos

## 2024-03-13 NOTE — Progress Notes (Signed)
 PT Cancellation Note  Patient Details Name: MYKAEL TROTT MRN: [id number] DOB: 02-Oct-1980   Cancelled Treatment:    Reason Eval/Treat Not Completed: Patient not medically ready - possible transfer to [facility] [place], PT to hold today given workup.   Evea Sheek S, PT DPT Acute Rehabilitation Services Secure Chat Preferred  Office [phone]    Johana FORBES [place] 03/13/2024, 2:52 PM

## 2024-03-13 NOTE — Progress Notes (Signed)
 Peripherally Inserted Central Catheter Placement  The IV Nurse has discussed with the patient and/or persons authorized to consent for the patient, the purpose of this procedure and the potential benefits and risks involved with this procedure.  The benefits include less needle sticks, lab draws from the catheter, and the patient may be discharged home with the catheter. Risks include, but not limited to, infection, bleeding, blood clot (thrombus formation), and puncture of an artery; nerve damage and irregular heartbeat and possibility to perform a PICC exchange if needed/ordered by physician.  Alternatives to this procedure were also discussed.  Bard Power PICC patient education guide, fact sheet on infection prevention and patient information card has been provided to patient /or left at bedside.    PICC Placement Documentation  PICC Double Lumen 03/13/24 Right Brachial 38 cm 2 cm (Active)  Indication for Insertion or Continuance of Line Limited venous access - need for IV therapy >5 days (PICC only) 03/13/24 1000  Exposed Catheter (cm) 2 cm 03/13/24 1000  Site Assessment Clean, Dry, Intact 03/13/24 1000  Lumen #1 Status Flushed;Saline locked;Blood return noted 03/13/24 1000  Lumen #2 Status Flushed;Saline locked;Blood return noted 03/13/24 1000  Dressing Type Transparent;Securing device 03/13/24 1000  Dressing Status Antimicrobial disc/dressing in place;Clean, Dry, Intact 03/13/24 1000  Line Care Connections checked and tightened 03/13/24 1000  Line Adjustment (NICU/IV Team Only) No 03/13/24 1000  Dressing Intervention New dressing;Adhesive placed at insertion site (IV team only) 03/13/24 1000  Dressing Change Due 03/20/24 03/13/24 1000       Laurie Sutton 03/13/2024, 10:15 AM

## 2024-03-13 NOTE — Telephone Encounter (Signed)
 [facility] made aware of need for transfer to [facility],

## 2024-03-13 NOTE — Inpatient Diabetes Management (Signed)
 Inpatient Diabetes Program Recommendations  AACE/ADA: New Consensus Statement on Inpatient Glycemic Control   Target Ranges:  Prepandial:   less than 140 mg/dL      Peak postprandial:   less than 180 mg/dL (1-2 hours)      Critically ill patients:  140 - 180 mg/dL    Latest Reference Range & Units 03/12/24 17:01 03/12/24 20:09 03/13/24 00:09 03/13/24 04:18 03/13/24 07:44  Glucose-Capillary 70 - 99 mg/dL 807 (H) 782 (H) 803 (H) 209 (H) 167 (H)   Review of Glycemic Control  Diabetes history: DM2 Outpatient Diabetes medications: Humulin  R U500 65 units QAM, 60 units with lunch, 60 units with supper, Dexcom G7 Current orders for Inpatient glycemic control: Novolog  0-9 units Q4H  Inpatient Diabetes Program Recommendations:    Insulin : Please consider ordering Semglee  8 units Q24H (based on 83.5 kg x 0.1 units).  Thanks, Earnie Gainer, RN, MSN, CDCES Diabetes Coordinator Inpatient Diabetes Program [phone] (Team Pager from 8am to 5pm)

## 2024-03-14 DIAGNOSIS — K7460 Unspecified cirrhosis of liver: Secondary | ICD-10-CM | POA: Diagnosis not present

## 2024-03-14 DIAGNOSIS — K7290 Hepatic failure, unspecified without coma: Secondary | ICD-10-CM | POA: Diagnosis not present

## 2024-03-14 DIAGNOSIS — K7682 Hepatic encephalopathy: Secondary | ICD-10-CM | POA: Diagnosis not present

## 2024-03-14 DIAGNOSIS — R5383 Other fatigue: Secondary | ICD-10-CM | POA: Diagnosis not present

## 2024-03-14 DIAGNOSIS — K7581 Nonalcoholic steatohepatitis (NASH): Secondary | ICD-10-CM

## 2024-03-14 DIAGNOSIS — K7469 Other cirrhosis of liver: Secondary | ICD-10-CM

## 2024-03-14 DIAGNOSIS — G2581 Restless legs syndrome: Secondary | ICD-10-CM

## 2024-03-14 DIAGNOSIS — R188 Other ascites: Secondary | ICD-10-CM | POA: Diagnosis not present

## 2024-03-14 LAB — GLUCOSE, CAPILLARY
Glucose-Capillary: 176 mg/dL — ABNORMAL HIGH (ref 70–99)
Glucose-Capillary: 186 mg/dL — ABNORMAL HIGH (ref 70–99)
Glucose-Capillary: 204 mg/dL — ABNORMAL HIGH (ref 70–99)
Glucose-Capillary: 205 mg/dL — ABNORMAL HIGH (ref 70–99)
Glucose-Capillary: 213 mg/dL — ABNORMAL HIGH (ref 70–99)
Glucose-Capillary: 227 mg/dL — ABNORMAL HIGH (ref 70–99)

## 2024-03-14 LAB — COMPREHENSIVE METABOLIC PANEL WITH GFR
ALT: 19 U/L (ref 0–44)
AST: 49 U/L — ABNORMAL HIGH (ref 15–41)
Albumin: 3.7 g/dL (ref 3.5–5.0)
Alkaline Phosphatase: 38 U/L (ref 38–126)
Anion gap: 12 (ref 5–15)
BUN: 12 mg/dL (ref 6–20)
CO2: 23 mmol/L (ref 22–32)
Calcium: 8.6 mg/dL — ABNORMAL LOW (ref 8.9–10.3)
Chloride: 97 mmol/L — ABNORMAL LOW (ref 98–111)
Creatinine, Ser: 0.9 mg/dL (ref 0.44–1.00)
GFR, Estimated: >60 mL/min (ref >60)
Glucose, Bld: 217 mg/dL — ABNORMAL HIGH (ref 70–99)
Potassium: 3.3 mmol/L — ABNORMAL LOW (ref 3.5–5.1)
Sodium: 132 mmol/L — ABNORMAL LOW (ref 135–145)
Total Bilirubin: 6.3 mg/dL — ABNORMAL HIGH (ref 0.0–1.2)
Total Protein: 5.9 g/dL — ABNORMAL LOW (ref 6.5–8.1)

## 2024-03-14 LAB — BODY FLUID CELL COUNT WITH DIFFERENTIAL
Eos, Fluid: 0 %
Lymphs, Fluid: 87 %
Monocyte-Macrophage-Serous Fluid: 17 % — ABNORMAL LOW (ref 50–90)
Neutrophil Count, Fluid: 0 % (ref 0–25)
Total Nucleated Cell Count, Fluid: 87 uL (ref 0–1000)

## 2024-03-14 LAB — CBC
HCT: 23.1 % — ABNORMAL LOW (ref 36.0–46.0)
Hemoglobin: 7.1 g/dL — ABNORMAL LOW (ref 12.0–15.0)
MCH: 27.7 pg (ref 26.0–34.0)
MCHC: 30.7 g/dL (ref 30.0–36.0)
MCV: 90.2 fL (ref 80.0–100.0)
Platelets: 42 K/uL — ABNORMAL LOW (ref 150–400)
RBC: 2.56 MIL/uL — ABNORMAL LOW (ref 3.87–5.11)
RDW: 19.3 % — ABNORMAL HIGH (ref 11.5–15.5)
WBC: 3.1 K/uL — ABNORMAL LOW (ref 4.0–10.5)
nRBC: 0 % (ref 0.0–0.2)

## 2024-03-14 LAB — PROTIME-INR
INR: 2.9 — ABNORMAL HIGH (ref 0.8–1.2)
Prothrombin Time: 31.6 s — ABNORMAL HIGH (ref 11.4–15.2)

## 2024-03-14 LAB — AMMONIA: Ammonia: 47 umol/L — ABNORMAL HIGH (ref 9–35)

## 2024-03-14 MED ORDER — OXYCODONE HCL 5 MG PO TABS
2.5000 mg | ORAL_TABLET | Freq: Once | ORAL | Status: AC
  Administered 2024-03-14: 2.5 mg via ORAL
  Filled 2024-03-14: qty 1

## 2024-03-14 MED ORDER — POTASSIUM CHLORIDE CRYS ER 20 MEQ PO TBCR
40.0000 meq | EXTENDED_RELEASE_TABLET | Freq: Once | ORAL | Status: AC
  Administered 2024-03-14: 40 meq via ORAL
  Filled 2024-03-14: qty 2

## 2024-03-14 MED ORDER — OXYCODONE HCL 5 MG PO TABS
2.5000 mg | ORAL_TABLET | Freq: Four times a day (QID) | ORAL | Status: DC | PRN
  Administered 2024-03-14: 2.5 mg via ORAL
  Filled 2024-03-14: qty 1

## 2024-03-14 MED ORDER — METHOCARBAMOL 500 MG PO TABS
500.0000 mg | ORAL_TABLET | Freq: Four times a day (QID) | ORAL | Status: DC | PRN
  Administered 2024-03-14: 500 mg via ORAL
  Filled 2024-03-14: qty 1

## 2024-03-14 MED ORDER — PROCHLORPERAZINE EDISYLATE 10 MG/2ML IJ SOLN
2.5000 mg | Freq: Once | INTRAMUSCULAR | Status: AC
  Administered 2024-03-14: 2.5 mg via INTRAVENOUS
  Filled 2024-03-14: qty 2

## 2024-03-14 NOTE — Plan of Care (Signed)
  Problem: Education: Goal: Ability to describe self-care measures that may prevent or decrease complications (Diabetes Survival Skills Education) will improve Outcome: Progressing   Problem: Fluid Volume: Goal: Ability to maintain a balanced intake and output will improve Outcome: Progressing   Problem: Health Behavior/Discharge Planning: Goal: Ability to identify and utilize available resources and services will improve Outcome: Progressing

## 2024-03-14 NOTE — Progress Notes (Signed)
 [facility] ROUNDING NOTE   Subjective: Feels lethargic and tired otherwise no specific complaints   Objective: Vital signs in last 24 hours: Temp:  [98.1 F (36.7 C)-102.8 F (39.3 C)] 99.3 F (37.4 C) (12/03 0700) Pulse Rate:  [83-89] 89 (12/03 0700) Resp:  [20-29] 20 (12/03 0700) BP: (97-137)/(44-67) 115/60 (12/03 0700) SpO2:  [90 %-97 %] 90 % (12/03 0700) Last BM Date : 03/13/24 General: NAD, no asterixis Abdomen: Soft, distended + ascites Ext: Bilateral edema    Intake/Output from previous day: 12/02 0701 - 12/03 0700 In: 2533.7 [P.O.:480; I.V.:516.2; IV Piggyback:1537.5] Out: 1175 [Urine:500; Stool:675] Intake/Output this shift: No intake/output data recorded.   Lab Results: Recent Labs    03/12/24 1135 03/12/24 1843 03/13/24 0359 03/14/24 0239  WBC 1.7*  --  2.1* 3.1*  HGB 8.0* 6.9* 7.6* 7.1*  PLT 57*  --  50* 42*  MCV 88.8  --  87.4 90.2   BMET Recent Labs    03/12/24 1221 03/13/24 0359 03/14/24 0239  NA 131* 131* 132*  K 3.6 3.5 3.3*  CL 97* 101 97*  CO2 21* 23 23  GLUCOSE 217* 207* 217*  BUN 13 10 12   CREATININE 0.90 0.93 0.90  CALCIUM  8.4* 8.2* 8.6*   LFT Recent Labs    03/12/24 1221 03/13/24 0359 03/14/24 0239  PROT 6.0* 5.8* 5.9*  ALBUMIN  3.3* 3.2* 3.7  AST 46* 48* 49*  ALT 23 20 19   ALKPHOS 58 49 38  BILITOT 5.3* 9.4* 6.3*   PT/INR Recent Labs    03/13/24 0359 03/14/24 0239  INR 2.5* 2.9*      Imaging/Other results: DG Chest Port 1 View Result Date: 03/12/2024 CLINICAL DATA:  Shortness of breath EXAM: PORTABLE CHEST 1 VIEW COMPARISON:  Chest x-ray 03/12/2024 FINDINGS: There central interstitial opacities bilaterally. There is no pleural effusion or pneumothorax. Cardiomediastinal silhouette is within normal limits. No acute fractures are seen. IMPRESSION: Central interstitial opacities bilaterally, which may represent pulmonary edema or atypical infection. Electronically Signed   By: Greig Pique M.D.    On: 03/12/2024 23:27   US  EKG SITE RITE Result Date: 03/12/2024 If Site Rite image not attached, placement could not be confirmed due to current cardiac rhythm.  CT Head Wo Contrast Result Date: 03/12/2024 CLINICAL DATA:  Altered mental status EXAM: CT HEAD WITHOUT CONTRAST TECHNIQUE: Contiguous axial images were obtained from the base of the skull through the vertex without intravenous contrast. RADIATION DOSE REDUCTION: This exam was performed according to the departmental dose-optimization program which includes automated exposure control, adjustment of the mA and/or kV according to patient size and/or use of iterative reconstruction technique. COMPARISON:  10/21/2011 FINDINGS: Brain: No evidence of acute infarction, hemorrhage, hydrocephalus, extra-axial collection or mass lesion/mass effect. Vascular: No hyperdense vessel or unexpected calcification. Skull: Normal. Negative for fracture or focal lesion. Sinuses/Orbits: No acute finding. Other: None. IMPRESSION: No acute intracranial abnormality by noncontrast CT. Electronically Signed   By: CHRISTELLA.  Shick M.D.   On: 03/12/2024 16:39   DG Chest Port 1 View if patient is in a treatment room. Result Date: 03/12/2024 CLINICAL DATA:  Possible sepsis. EXAM: PORTABLE CHEST 1 VIEW COMPARISON:  02/26/2024.  CT chest 10/04/2023. FINDINGS: Trachea is midline. Heart is enlarged. Lungs are somewhat low in volume with minimal streaky atelectasis in the lung bases. No airspace consolidation or pleural fluid. IMPRESSION: Low lung volumes with mild streaky bibasilar atelectasis. Electronically Signed   By: Newell Eke M.D.   On: 03/12/2024 14:01  Assessment &Plan  43 year old female with decompensated MASH cirrhosis, currently actively listed for liver transplant  MELD 3.0: 27 at 03/14/2024  2:39 AM MELD-Na: 27 at 03/14/2024  2:39 AM  Worsening INR  Infectious workup negative, respiratory panel negative, ascites fluid and blood culture no growth to  date  Worsening decompensation of liver, accepted by [facility] hepatology at [facility] once bed becomes available  Continue current meds and supportive care   I have spent >35 minutes of patient care (this includes precharting, chart review, review of results, face-to-face time used for counseling as well as treatment plan and follow-up. The patient was provided an opportunity to ask questions and all were answered. The patient agreed with the plan and demonstrated an understanding of the instructions.    LOIS Wilkie Mcgee , MD [phone]  [facility]

## 2024-03-14 NOTE — Progress Notes (Signed)
 Patient lives at home spouse. Plan for transfer to [facility] hospital for further care.

## 2024-03-14 NOTE — Discharge Summary (Signed)
 PATIENT DETAILS Name: Laurie Sutton Age: 43 y.o. Sex: female Date of Birth: 1981-03-10 MRN: [id number]. Admitting Physician: Mario Tobie GAILS, MD ERE:Izqjlou, Provider, MD  Admit Date: 03/12/2024 Discharge date: 03/14/2024  Recommendations for Outpatient Follow-up:  Follow-up with transplant hepatology as instructed postdischarge from [facility].  Admitted From:  Home  Disposition: [facility] in [place]    Discharge Condition: fair  CODE STATUS:   Code Status: Full Code   Diet recommendation:  Diet Order             Diet - low sodium heart healthy           Diet 2 gram sodium Room service appropriate? Yes; Fluid consistency: Thin  Diet effective now                    Brief Summary: Patient is a 43 y.o.  female with history of NASH liver cirrhosis-chronic hepatic encephalopathy-refractory ascites requiring large-volume paracentesis-with numerous recent hospitalizations for decompensated liver cirrhosis-presented to the hospital with fever/lethargy-and subsequently admitted to the hospitalist service.  She was stabilized with supportive care-SBP was ruled out-she was thought to have progressively declining liver cirrhosis-and is on the transplant list-hence after discussion with transplant service-she is being transferred to [facility] transplant service.   Significant events: 12/1>> paracentesis-8 L removed 12/1>> admit to [facility]-Presented with lethargy/fever-after paracentesis.   Significant studies: 12/1>> CXR: Mild streaky bibasilar atelectasis 12/1>> CT head: No acute intracranial abnormality 12/1>> CXR: Central interstitial opacities-edema versus atypical infection.   Significant microbiology data: 12/1>> COVID/influenza/RSV PCR: Negative 12/1>> blood culture: No growth 12/1>> peritoneal fluid culture: No growth.   Procedures: None   Consults: GI  Brief Hospital Course: SIRS possibly secondary to pneumonia ?  Aspiration  secondary to chronic encephalopathy All cultures negative to date No SBP on ascitic fluid analysis Discontinue vancomycin  on 12/3 Continue cefepime /Flagyl    Decompensated liver cirrhosis with chronic hepatic encephalopathy/refractory ascites Remains tenuous but stable-numerous recent hospitalizations for this issue-suspected to have worsening liver cirrhosis with progressively declining MELD score-with this being her fifth hospitalization in the past 1 month alone. Closely followed by GI during this hospitalization-continue Xifaxan /lactulose /Aldactone /Lasix /octreotide /midodrine /IV albumin  No room for beta-blocker On transplant list-has been accepted-by transplant hepatology service [facility]-awaiting bed.   Pancytopenia Likely due to hypersplenism in the setting of liver cirrhosis Did require 1 unit of PRBC on 12/1 for drop in hemoglobin-without any evidence of bleeding (brown stools) Continue to follow CBC.   DM-2 (A1c 7.9 on 11/11) CBG stable Semglee  8 units daily+ SSI   Rest leg syndrome Requip    GERD PPI   Mood disorder Continue fluoxetine   Discharge Diagnoses:  Principal Problem:   Lethargy   Discharge Instructions:  Activity:  As tolerated with Full fall precautions use walker/cane & assistance as needed  Discharge Instructions     Diet - low sodium heart healthy   Complete by: As directed    Discharge instructions   Complete by: As directed    Follow with Primary MD in 1-2 weeks  Please get a complete blood count and chemistry panel checked by your Primary MD at your next visit, and again as instructed by your Primary MD.  Get Medicines reviewed and adjusted: Please take all your medications with you for your next visit with your Primary MD  Laboratory/radiological data: Please request your Primary MD to go over all hospital tests and procedure/radiological results at the follow up, please ask your Primary MD to get all Hospital records sent to  his/her office.  In some cases, they will be blood work, cultures and biopsy results pending at the time of your discharge. Please request that your primary care M.D. follows up on these results.  Also Note the following: If you experience worsening of your admission symptoms, develop shortness of breath, life threatening emergency, suicidal or homicidal thoughts you must seek medical attention immediately by calling 911 or calling your MD immediately  if symptoms less severe.  You must read complete instructions/literature along with all the possible adverse reactions/side effects for all the Medicines you take and that have been prescribed to you. Take any new Medicines after you have completely understood and accpet all the possible adverse reactions/side effects.   Do not drive when taking Pain medications or sleeping medications (Benzodaizepines)  Do not take more than prescribed Pain, Sleep and Anxiety Medications. It is not advisable to combine anxiety,sleep and pain medications without talking with your primary care practitioner  Special Instructions: If you have smoked or chewed Tobacco  in the last 2 yrs please stop smoking, stop any regular Alcohol  and or any Recreational drug use.  Wear Seat belts while driving.  Please note: You were cared for by a hospitalist during your hospital stay. Once you are discharged, your primary care physician will handle any further medical issues. Please note that NO REFILLS for any discharge medications will be authorized once you are discharged, as it is imperative that you return to your primary care physician (or establish a relationship with a primary care physician if you do not have one) for your post hospital discharge needs so that they can reassess your need for medications and monitor your lab values.   Increase activity slowly   Complete by: As directed       Allergies as of 03/14/2024       Reactions   Morphine  Itching, Rash    Severe    Penicillins Hives   Has patient had a PCN reaction causing immediate rash, facial/tongue/throat swelling, SOB or lightheadedness with hypotension: yes. Rash  Has patient had a PCN reaction causing severe rash involving mucus membranes or skin necrosis: Yes- rash and hives all over body  Has patient had a PCN reaction that required hospitalization No Has patient had a PCN reaction occurring within the last 10 years: Yes  If all of the above answers are NO, then may proceed with Cephalosporin use.   Vitamin K  And Related Other (See Comments)   Electric shock    Latex Itching, Other (See Comments)   IRRITATES VAGINAL AREA   Vancomycin  Itching   Amoxicillin Hives, Itching   Azithromycin  Other (See Comments)   DOES NOT WORK   Dulaglutide  Other (See Comments)   constipation and stomach issues  **Trulicity **   Empagliflozin -metformin  Hcl Er Swelling, Other (See Comments)   Januvia  [sitagliptin ] Other (See Comments)   HURTS STOAMCH   Metformin  Other (See Comments)   Other Reaction(s): achy all over        Medication List     STOP taking these medications    baclofen 10 MG tablet Commonly known as: LIORESAL   clotrimazole -betamethasone  cream Commonly known as: LOTRISONE    Dexcom G7 Sensor Misc   ergocalciferol  1.25 MG (50000 UT) capsule Commonly known as: VITAMIN D2   HUMULIN  R 500 UNIT/ML injection Generic drug: insulin  regular human CONCENTRATED   montelukast  10 MG tablet Commonly known as: SINGULAIR    omeprazole  40 MG capsule Commonly known as: PRILOSEC   ondansetron  4 MG  disintegrating tablet Commonly known as: ZOFRAN -ODT   ondansetron  4 MG tablet Commonly known as: ZOFRAN    temazepam  30 MG capsule Commonly known as: RESTORIL        TAKE these medications    albumin  human 25 % bottle Inject 100 mLs (25 g total) into the vein every 6 (six) hours.   ceFEPIme  2 g in sodium chloride  0.9 % 100 mL Inject 2 g into the vein every 8 (eight) hours.    FLUoxetine  40 MG capsule Commonly known as: PROZAC  Take 1 capsule (40 mg total) by mouth 2 (two) times daily.   insulin  aspart 100 UNIT/ML injection Commonly known as: novoLOG  Inject 0-9 Units into the skin every 4 (four) hours.   insulin  glargine-yfgn 100 UNIT/ML injection Commonly known as: SEMGLEE  Inject 0.08 mLs (8 Units total) into the skin daily.   lactulose  10 GM/15ML solution Commonly known as: CHRONULAC  Take 45 mLs (30 g total) by mouth 3 (three) times daily.   metroNIDAZOLE  500 MG/100ML Commonly known as: FLAGYL  Inject 100 mLs (500 mg total) into the vein every 12 (twelve) hours.   midodrine  10 MG tablet Commonly known as: PROAMATINE  Take 1 tablet (10 mg total) by mouth 3 (three) times daily with meals. What changed:  medication strength how much to take when to take this   octreotide  500 mcg in sodium chloride  0.9 % 250 mL Inject 50 mcg/hr into the vein continuous.   pantoprazole  40 MG injection Commonly known as: PROTONIX  Inject 40 mg into the vein every 12 (twelve) hours.   rifaximin  550 MG Tabs tablet Commonly known as: XIFAXAN  Take 550 mg by mouth 2 (two) times daily.   rOPINIRole  1 MG tablet Commonly known as: REQUIP  Take 1 tablet (1 mg total) by mouth at bedtime.   spironolactone  25 MG tablet Commonly known as: ALDACTONE  Take 1 tablet (25 mg total) by mouth daily. What changed:  medication strength how much to take        Follow-up Information     Primary care practitioner. Schedule an appointment as soon as possible for a visit in 1 week(s).                 Allergies  Allergen Reactions   Morphine  Itching and Rash    Severe    Penicillins Hives    Has patient had a PCN reaction causing immediate rash, facial/tongue/throat swelling, SOB or lightheadedness with hypotension: yes. Rash  Has patient had a PCN reaction causing severe rash involving mucus membranes or skin necrosis: Yes- rash and hives all over body  Has patient had  a PCN reaction that required hospitalization No Has patient had a PCN reaction occurring within the last 10 years: Yes  If all of the above answers are NO, then may proceed with Cephalosporin use.    Vitamin K  And Related Other (See Comments)    Electric shock    Latex Itching and Other (See Comments)    IRRITATES VAGINAL AREA   Vancomycin  Itching   Amoxicillin Hives and Itching   Azithromycin  Other (See Comments)    DOES NOT WORK   Dulaglutide  Other (See Comments)    constipation and stomach issues  **Trulicity **    Empagliflozin -Metformin  Hcl Er Swelling and Other (See Comments)   Januvia  [Sitagliptin ] Other (See Comments)    HURTS STOAMCH   Metformin  Other (See Comments)    Other Reaction(s): achy all over     Other Procedures/Studies: DG Chest Port 1 View Result Date: 03/12/2024 CLINICAL DATA:  Shortness of breath EXAM: PORTABLE CHEST 1 VIEW COMPARISON:  Chest x-ray 03/12/2024 FINDINGS: There central interstitial opacities bilaterally. There is no pleural effusion or pneumothorax. Cardiomediastinal silhouette is within normal limits. No acute fractures are seen. IMPRESSION: Central interstitial opacities bilaterally, which may represent pulmonary edema or atypical infection. Electronically Signed   By: Greig Pique M.D.   On: 03/12/2024 23:27   US  EKG SITE RITE Result Date: 03/12/2024 If Site Rite image not attached, placement could not be confirmed due to current cardiac rhythm.  CT Head Wo Contrast Result Date: 03/12/2024 CLINICAL DATA:  Altered mental status EXAM: CT HEAD WITHOUT CONTRAST TECHNIQUE: Contiguous axial images were obtained from the base of the skull through the vertex without intravenous contrast. RADIATION DOSE REDUCTION: This exam was performed according to the departmental dose-optimization program which includes automated exposure control, adjustment of the mA and/or kV according to patient size and/or use of iterative reconstruction technique. COMPARISON:   10/21/2011 FINDINGS: Brain: No evidence of acute infarction, hemorrhage, hydrocephalus, extra-axial collection or mass lesion/mass effect. Vascular: No hyperdense vessel or unexpected calcification. Skull: Normal. Negative for fracture or focal lesion. Sinuses/Orbits: No acute finding. Other: None. IMPRESSION: No acute intracranial abnormality by noncontrast CT. Electronically Signed   By: CHRISTELLA.  Shick M.D.   On: 03/12/2024 16:39   DG Chest Port 1 View if patient is in a treatment room. Result Date: 03/12/2024 CLINICAL DATA:  Possible sepsis. EXAM: PORTABLE CHEST 1 VIEW COMPARISON:  02/26/2024.  CT chest 10/04/2023. FINDINGS: Trachea is midline. Heart is enlarged. Lungs are somewhat low in volume with minimal streaky atelectasis in the lung bases. No airspace consolidation or pleural fluid. IMPRESSION: Low lung volumes with mild streaky bibasilar atelectasis. Electronically Signed   By: Newell Eke M.D.   On: 03/12/2024 14:01   IR Paracentesis Result Date: 03/12/2024 INDICATION: 43 year old female. History of cirrhosis with recurrent ascites. Request is for therapeutic paracentesis. 8 L maximum EXAM: ULTRASOUND GUIDED THERAPEUTIC LEFT-SIDED PARACENTESIS MEDICATIONS: Lidocaine  1% 10 mL COMPLICATIONS: None immediate. PROCEDURE: Informed written consent was obtained from the patient after a discussion of the risks, benefits and alternatives to treatment. A timeout was performed prior to the initiation of the procedure. Initial ultrasound scanning demonstrates a large amount of ascites within the left lower abdominal quadrant. The left lower abdomen was prepped and draped in the usual sterile fashion. 1% lidocaine  was used for local anesthesia. Following this, a 19 gauge, 7-cm, Yueh catheter was introduced. An ultrasound image was saved for documentation purposes. The paracentesis was performed. The catheter was removed and a dressing was applied. The patient tolerated the procedure well without immediate post  procedural complication. Patient received post-procedure intravenous albumin ; see nursing notes for details. FINDINGS: A total of approximately 8 L of straw-colored fluid was removed. Diagnostic samples held in IR she team a request fluid sampling. IMPRESSION: Successful ultrasound-guided paracentesis yielding 8 liters of peritoneal fluid. Performed by Delon Beagle NP PLAN: Patient currently being [facility] possible [facility] [place]. Electronically Signed   By: Juliene Balder M.D.   On: 03/12/2024 11:48   IR Paracentesis Result Date: 03/06/2024 INDICATION: Patient with history of cirrhosis with recurrent ascites. Interventional Radiology asked to perform a therapeutic paracentesis with an 8 L max. EXAM: ULTRASOUND GUIDED  PARACENTESIS MEDICATIONS: 1% lidocaine  15 mL COMPLICATIONS: None immediate. PROCEDURE: Informed written consent was obtained from the patient after a discussion of the risks, benefits and alternatives to treatment. A timeout was performed prior to the initiation of the procedure.  Initial ultrasound scanning demonstrates a large amount of ascites within the left lower abdominal quadrant. The left lower abdomen was prepped and draped in the usual sterile fashion. 1% lidocaine  was used for local anesthesia. Following this, a 19 gauge, 7-cm, Yueh catheter was introduced. An ultrasound image was saved for documentation purposes. The paracentesis was performed. After approximately 4 L the catheter stopped draining and ultrasound imaging showed a residual large volume of ascites. A new area was marked and the site was prepped and draped in the usual sterile fashion. 1% lidocaine  was used for local anesthesia. A 19 gauge 7 cm Yueh was introduced and the paracentesis was performed. The catheter was removed and a dressing was applied. The patient tolerated the procedure well without immediate post procedural complication. Patient received post-procedure intravenous albumin ; see  nursing notes for details. FINDINGS: A total of approximately 8 L of clear yellow fluid was removed. IMPRESSION: Successful ultrasound-guided paracentesis yielding 8 liters of peritoneal fluid. Procedure performed by: Warren Dais, NP PLAN: Patient is currently on the liver transplant waiting list with [facility]. Electronically Signed   By: Ester Sides M.D.   On: 03/06/2024 15:54   IR Paracentesis Result Date: 02/29/2024 INDICATION: 43 year old female with a history of NASH cirrhosis with recurrent ascites. Request for diagnostic and therapeutic paracentesis. EXAM: ULTRASOUND GUIDED DIAGNOSTIC AND THERAPEUTIC, LEFT-SIDED PARACENTESIS MEDICATIONS: 1% lidocaine , 8 mL. COMPLICATIONS: None immediate. PROCEDURE: Informed written consent was obtained from the patient after a discussion of the risks, benefits and alternatives to treatment. A timeout was performed prior to the initiation of the procedure. Initial ultrasound scanning demonstrates a large amount of ascites within the left lower abdominal quadrant. The left lower abdomen was prepped and draped in the usual sterile fashion. 1% lidocaine  was used for local anesthesia. Following this, a 19 gauge, 7-cm, Yueh catheter was introduced. An ultrasound image was saved for documentation purposes. The paracentesis was performed. The catheter was removed and a dressing was applied. The patient tolerated the procedure well without immediate post procedural complication. Patient received post-procedure intravenous albumin ; see nursing notes for details. FINDINGS: A total of approximately 7.4 L of dark yellow fluid was removed. Samples were sent to the laboratory as requested by the clinical team. IMPRESSION: Successful ultrasound-guided paracentesis yielding 7.4 liters of peritoneal fluid. PLAN: Patient currently being worked up for possible transplant at [facility]. Procedure performed by: Sherrilee Bal, PA-C under the supervision of Dr. ONEIDA Specking  Electronically Signed   By: CHRISTELLA.  Shick M.D.   On: 02/29/2024 10:35   IR Paracentesis Result Date: 02/27/2024 INDICATION: 43 year old female. History of NASH cirrhosis with recurrent ascites. Request for therapeutic paracentesis EXAM: ULTRASOUND GUIDED LEFT-SIDED THERAPEUTIC PARACENTESIS MEDICATIONS: Hip lidocaine  1%.  10 mL COMPLICATIONS: None immediate. PROCEDURE: Informed written consent was obtained from the patient after a discussion of the risks, benefits and alternatives to treatment. A timeout was performed prior to the initiation of the procedure. Initial ultrasound scanning demonstrates a large amount of ascites within the left lower abdominal quadrant. The left lower abdomen was prepped and draped in the usual sterile fashion. 1% lidocaine  was used for local anesthesia. Following this, a 19 gauge, 7-cm, Yueh catheter was introduced. An ultrasound image was saved for documentation purposes. The paracentesis was performed. The catheter was removed and a dressing was applied. The patient tolerated the procedure well without immediate post procedural complication. SABRA FINDINGS: A total of approximately 4 L of straw-colored fluid was removed. IMPRESSION: Successful ultrasound-guided left-sided therapeutic paracentesis yielding 4 liters of  peritoneal fluid. Performed by Delon Beagle NP PLAN: Patient currently being worked up for possible transplant at [facility]. Electronically Signed   By: Cordella Banner   On: 02/27/2024 15:55   DG Chest Portable 1 View Result Date: 02/26/2024 EXAM: 1 VIEW(S) XRAY OF THE CHEST 02/26/2024 02:36:00 AM COMPARISON: None available. CLINICAL HISTORY: eval for cough FINDINGS: LUNGS AND PLEURA: Pulmonary hypoinflation. Left basilar atelectasis. No pleural effusion. No pneumothorax. HEART AND MEDIASTINUM: No acute abnormality of the cardiac and mediastinal silhouettes. BONES AND SOFT TISSUES: No acute osseous abnormality. IMPRESSION: 1. Pulmonary hypoinflation and  left basilar atelectasis. Electronically signed by: Dorethia Molt MD 02/26/2024 02:48 AM EST RP Workstation: [id number]   IR Paracentesis Result Date: 02/20/2024 INDICATION: 43 year old female. History of NASH cirrhosis with recurrent ascites. Request is for therapeutic and diagnostic paracentesis EXAM: ULTRASOUND GUIDED THERAPEUTIC DIAGNOSTIC LEFT-SIDED PARACENTESIS MEDICATIONS: Lidocaine  1% 10 mL COMPLICATIONS: None immediate. PROCEDURE: Informed written consent was obtained from the patient after a discussion of the risks, benefits and alternatives to treatment. A timeout was performed prior to the initiation of the procedure. Initial ultrasound scanning demonstrates a large amount of ascites within the left lower abdominal quadrant. The left lower abdomen was prepped and draped in the usual sterile fashion. 1% lidocaine  was used for local anesthesia. Following this, a 19 gauge, 7-cm, Yueh catheter was introduced. An ultrasound image was saved for documentation purposes. The paracentesis was performed. The catheter was removed and a dressing was applied. The patient tolerated the procedure well without immediate post procedural complication. Patient received post-procedure intravenous albumin ; see nursing notes for details. FINDINGS: A total of approximately 8 L of straw-colored fluid was removed. Samples were sent to the laboratory as requested by the clinical team. IMPRESSION: Successful ultrasound-guided therapeutic and diagnostic left-sided paracentesis yielding 8 liters of peritoneal fluid. Performed by Delon Beagle NP PLAN: Patient is currently being worked up for transplant at [facility] Electronically Signed   By: Cordella Banner   On: 02/20/2024 17:08   US  Abdomen Limited RUQ (LIVER/GB) Result Date: 02/19/2024 EXAM: Right Upper Quadrant Abdominal Ultrasound 02/19/2024 05:14:00 PM TECHNIQUE: Real-time ultrasonography of the right upper quadrant of the abdomen was performed.  COMPARISON: CTA abdomen and pelvis 02/15/2024. CLINICAL HISTORY: RUQ pain. FINDINGS: LIVER: The liver is mildly enlarged and diffusely heterogeneous with decreased echotexture. There is nodular liver contour compatible with cirrhosis. No intrahepatic biliary ductal dilatation. No evidence of mass. BILIARY SYSTEM: Gallbladder sludge is present. A single gallbladder polyp is present measuring 6.6 mm. Gallbladder wall thickness is upper limits of normal at 3 mm. No pericholecystic fluid. No cholelithiasis. Negative sonographic Murphy's sign. Common bile duct measures 1.6 mm. RIGHT KIDNEY: The right kidney is grossly unremarkable in appearances without evidence of hydronephrosis, echogenic calculi or worrisome mass lesions. PANCREAS: Visualized portions of the pancreas are unremarkable. OTHER: There is a large amount of ascites in the right upper quadrant. IMPRESSION: 1. Large amount of ascites in the right upper quadrant. 2. Mildly enlarged liver with nodular contour compatible with cirrhosis. 3. Gallbladder sludge and a single 6.6 mm polyp. Consider ultrasound follow-up in 12 months to assess for growth per common practice for small gallbladder polyps. Electronically signed by: Greig Pique MD 02/19/2024 05:47 PM EST RP Workstation: [id number]   DG Chest Port 1 View Result Date: 02/19/2024 EXAM: 1 VIEW(S) XRAY OF THE CHEST 02/19/2024 01:12:00 PM COMPARISON: 01/31/2024 CLINICAL HISTORY: SOB FINDINGS: LUNGS AND PLEURA: Low lung volumes with vascular crowding and bibasilar subsegmental atelectasis. No pulmonary edema. No  pleural effusion. No pneumothorax. HEART AND MEDIASTINUM: Multiple wires and leads project over the chest on the frontal radiograph. No acute abnormality of the cardiac and mediastinal silhouettes. BONES AND SOFT TISSUES: No acute osseous abnormality. IMPRESSION: 1. Persistent low lung volumes with vascular crowding and bibasilar subsegmental atelectasis, unchanged from 01/31/2024. Electronically  signed by: Rockey Kilts MD 02/19/2024 01:45 PM EST RP Workstation: [id number]   CT Angio Abd/Pel W and/or Wo Contrast Result Date: 02/16/2024 EXAM: CTA ABDOMEN AND PELVIS WITHOUT AND WITH CONTRAST 02/15/2024 11:38:08 PM TECHNIQUE: CTA images of the abdomen and pelvis without and with intravenous contrast. 75 mL (iohexol  (OMNIPAQUE ) 350 MG/ML injection 75 mL IOHEXOL  350 MG/ML SOLN) was administered. Three-dimensional MIP/volume rendered formations were performed. Automated exposure control, iterative reconstruction, and/or weight based adjustment of the mA/kV was utilized to reduce the radiation dose to as low as reasonably achievable. COMPARISON: CT abdomen and pelvis 02/10/1999 and 02/04/2024. CLINICAL HISTORY: upper GI bleed FINDINGS: VASCULATURE: AORTA: No acute finding. No abdominal aortic aneurysm. No dissection. CELIAC TRUNK: No acute finding. No occlusion or significant stenosis. SUPERIOR MESENTERIC ARTERY: No acute finding. No occlusion or significant stenosis. RENAL ARTERIES: No acute finding. No occlusion or significant stenosis. ILIAC ARTERIES: No acute finding. No occlusion or significant stenosis. *Additional vascular findings:* Portal vein is enlarged. Splenic varices are present. There is recanalization of the umbilical vein. LIVER: Nodular liver contour and diffuse hepatic heterogeneity is again seen compatible with cirrhosis. GALLBLADDER AND BILE DUCTS: There is gallbladder wall edema. Small gallstones are present. No biliary ductal dilatation. SPLEEN: The spleen is moderately enlarged and unchanged. PANCREAS: The pancreas is unremarkable. ADRENAL GLANDS: Bilateral adrenal glands demonstrate no acute abnormality. KIDNEYS, URETERS AND BLADDER: Previous subcentimeter cyst in the superior pole and inferior pole of the left kidney. No stones in the kidneys or ureters. No hydronephrosis. No perinephric or periureteral stranding. Urinary bladder is unremarkable. GI AND BOWEL: Stomach and duodenal sweep  demonstrate no acute abnormality. There is diffuse colonic wall thickening/edema, more significant in the ascending colon. The appendix is normal. There is no active gastrointestinal bleeding identified. There is wall thickening and mesenteric edema involving jejunal loops. There is no bowel obstruction. No abnormal bowel distension. REPRODUCTIVE: Uterus is surgically absent. PERITONEUM AND RETROPERITONEUM: Moderate-to-large amount of ascites appears unchanged. No free air. LUNG BASE: There is atelectasis in the left lung base. LYMPH NODES: No lymphadenopathy. BONES AND SOFT TISSUES: No acute abnormality of the bones. Diffuse body wall edema has increased. IMPRESSION: 1. No active gastrointestinal bleeding identified. 2. Cirrhosis with nodular liver contour, diffuse hepatic heterogeneity, portal vein enlargement, splenic varices, and recanalized umbilical vein, compatible with portal hypertension. 3. Moderate-to-large ascites, unchanged, with increased diffuse body wall edema. 4. Gallbladder wall edema with small gallstones, which may reflect cholecystitis versus third-spacing; correlate clinically. 5. Diffuse colonic wall thickening/edema, greatest in the ascending colon, and jejunal wall thickening with mesenteric edema, which may reflect portal hypertensive enterocolopathy and/or superimposed infectious/inflammatory enterocolitis; correlate clinically. 6. Moderate splenomegaly, unchanged. Electronically signed by: Greig Pique MD 02/16/2024 12:06 AM EST RP Workstation: [id number]   IR Paracentesis Result Date: 02/15/2024 INDICATION: History of NASH cirrhosis with recurrent ascites. Request for therapeutic paracentesis. EXAM: ULTRASOUND GUIDED THERAPEUTIC PARACENTESIS MEDICATIONS: 10 mL 1% lidocaine  COMPLICATIONS: None immediate. PROCEDURE: Informed written consent was obtained from the patient after a discussion of the risks, benefits and alternatives to treatment. A timeout was performed prior to the  initiation of the procedure. Initial ultrasound scanning demonstrates a moderate amount of ascites within the left lower  abdominal quadrant. The left lower abdomen was prepped and draped in the usual sterile fashion. 1% lidocaine  was used for local anesthesia. Following this, a 19 gauge, 7-cm, Yueh catheter was introduced. An ultrasound image was saved for documentation purposes. The paracentesis was performed. The catheter was removed and a dressing was applied. The patient tolerated the procedure well without immediate post procedural complication. Patient received post-procedure intravenous albumin ; see nursing notes for details. FINDINGS: A total of approximately 7.4 liters of slightly hazy yellow fluid was removed. IMPRESSION: Successful ultrasound-guided paracentesis yielding 7.4 liters liters of peritoneal fluid. Performed by: Kimble Clas, PA-C Electronically Signed   By: Cordella Banner   On: 02/15/2024 11:24     TODAY-DAY OF DISCHARGE:  Subjective:   Laurie Sutton today has no headache,no chest abdominal pain,no new weakness tingling or numbness, feels much better wants to go home today.   Objective:   Blood pressure 128/64, pulse 87, temperature 99.3 F (37.4 C), temperature source Oral, resp. rate 20, height 5' 6 (1.676 m), weight 83.5 kg, last menstrual period 11/13/2020, SpO2 (!) 87%.  Intake/Output Summary (Last 24 hours) at 03/14/2024 1148 Last data filed at 03/14/2024 0333 Gross per 24 hour  Intake 2333.63 ml  Output 925 ml  Net 1408.63 ml   Filed Weights   03/12/24 1332  Weight: 83.5 kg    Exam: Awake Alert, Oriented *3, No new F.N deficits, Normal affect [place].AT,PERRAL Supple Neck,No JVD, No cervical lymphadenopathy appriciated.  Symmetrical Chest wall movement, Good air movement bilaterally, CTAB RRR,No Gallops,Rubs or new Murmurs, No Parasternal Heave +ve B.Sounds, Abd Soft, Non tender, No organomegaly appriciated, No rebound -guarding or rigidity. No Cyanosis,  Clubbing or edema, No new Rash or bruise   PERTINENT RADIOLOGIC STUDIES: DG Chest Port 1 View Result Date: 03/12/2024 CLINICAL DATA:  Shortness of breath EXAM: PORTABLE CHEST 1 VIEW COMPARISON:  Chest x-ray 03/12/2024 FINDINGS: There central interstitial opacities bilaterally. There is no pleural effusion or pneumothorax. Cardiomediastinal silhouette is within normal limits. No acute fractures are seen. IMPRESSION: Central interstitial opacities bilaterally, which may represent pulmonary edema or atypical infection. Electronically Signed   By: Greig Pique M.D.   On: 03/12/2024 23:27   US  EKG SITE RITE Result Date: 03/12/2024 If Site Rite image not attached, placement could not be confirmed due to current cardiac rhythm.  CT Head Wo Contrast Result Date: 03/12/2024 CLINICAL DATA:  Altered mental status EXAM: CT HEAD WITHOUT CONTRAST TECHNIQUE: Contiguous axial images were obtained from the base of the skull through the vertex without intravenous contrast. RADIATION DOSE REDUCTION: This exam was performed according to the departmental dose-optimization program which includes automated exposure control, adjustment of the mA and/or kV according to patient size and/or use of iterative reconstruction technique. COMPARISON:  10/21/2011 FINDINGS: Brain: No evidence of acute infarction, hemorrhage, hydrocephalus, extra-axial collection or mass lesion/mass effect. Vascular: No hyperdense vessel or unexpected calcification. Skull: Normal. Negative for fracture or focal lesion. Sinuses/Orbits: No acute finding. Other: None. IMPRESSION: No acute intracranial abnormality by noncontrast CT. Electronically Signed   By: CHRISTELLA.  Shick M.D.   On: 03/12/2024 16:39   DG Chest Port 1 View if patient is in a treatment room. Result Date: 03/12/2024 CLINICAL DATA:  Possible sepsis. EXAM: PORTABLE CHEST 1 VIEW COMPARISON:  02/26/2024.  CT chest 10/04/2023. FINDINGS: Trachea is midline. Heart is enlarged. Lungs are somewhat low in  volume with minimal streaky atelectasis in the lung bases. No airspace consolidation or pleural fluid. IMPRESSION: Low lung volumes with mild streaky  bibasilar atelectasis. Electronically Signed   By: Newell Eke M.D.   On: 03/12/2024 14:01     PERTINENT LAB RESULTS: CBC: Recent Labs    03/13/24 0359 03/14/24 0239  WBC 2.1* 3.1*  HGB 7.6* 7.1*  HCT 23.6* 23.1*  PLT 50* 42*   CMET CMP     Component Value Date/Time   NA 132 (L) 03/14/2024 0239   NA 138 08/19/2017 0000   K 3.3 (L) 03/14/2024 0239   CL 97 (L) 03/14/2024 0239   CO2 23 03/14/2024 0239   GLUCOSE 217 (H) 03/14/2024 0239   BUN 12 03/14/2024 0239   BUN 8 08/19/2017 0000   CREATININE 0.90 03/14/2024 0239   CREATININE 0.54 03/09/2023 1436   CREATININE 0.42 (L) 10/29/2021 0848   CALCIUM  8.6 (L) 03/14/2024 0239   PROT 5.9 (L) 03/14/2024 0239   ALBUMIN  3.7 03/14/2024 0239   AST 49 (H) 03/14/2024 0239   AST 76 (H) 03/09/2023 1436   ALT 19 03/14/2024 0239   ALT 47 (H) 03/09/2023 1436   ALKPHOS 38 03/14/2024 0239   BILITOT 6.3 (H) 03/14/2024 0239   BILITOT 1.7 (H) 03/09/2023 1436   GFR 107.21 10/20/2023 1114   EGFR 127 10/29/2021 0848   GFRNONAA >60 03/14/2024 0239   GFRNONAA >60 03/09/2023 1436    GFR Estimated Creatinine Clearance: 87.8 mL/min (by C-G formula based on SCr of 0.9 mg/dL). No results for input(s): LIPASE, AMYLASE in the last 72 hours. No results for input(s): CKTOTAL, CKMB, CKMBINDEX, TROPONINI in the last 72 hours. Invalid input(s): POCBNP No results for input(s): DDIMER in the last 72 hours. No results for input(s): HGBA1C in the last 72 hours. No results for input(s): CHOL, HDL, LDLCALC, TRIG, CHOLHDL, LDLDIRECT in the last 72 hours. Recent Labs    03/12/24 1221  TSH 0.178*   No results for input(s): VITAMINB12, FOLATE, FERRITIN, TIBC, IRON, RETICCTPCT in the last 72 hours. Coags: Recent Labs    03/13/24 0359 03/14/24 0239  INR 2.5* 2.9*    Microbiology: Recent Results (from the past 240 hours)  Culture, blood (Routine x 2)     Status: None (Preliminary result)   Collection Time: 03/12/24 11:35 AM   Specimen: BLOOD LEFT ARM  Result Value Ref Range Status   Specimen Description BLOOD LEFT ARM  Final   Special Requests   Final    BOTTLES DRAWN AEROBIC AND ANAEROBIC Blood Culture results may not be optimal due to an inadequate volume of blood received in culture bottles   Culture   Final    NO GROWTH 2 DAYS Performed at [facility], [address], [place], [place] [address]    Report Status PENDING  Incomplete  Culture, blood (Routine x 2)     Status: None (Preliminary result)   Collection Time: 03/12/24 11:42 AM   Specimen: BLOOD LEFT ARM  Result Value Ref Range Status   Specimen Description BLOOD LEFT ARM  Final   Special Requests   Final    BOTTLES DRAWN AEROBIC AND ANAEROBIC Blood Culture results may not be optimal due to an inadequate volume of blood received in culture bottles   Culture   Final    NO GROWTH 2 DAYS Performed at [facility], [address], [place], [place] [address]    Report Status PENDING  Incomplete  Resp panel by RT-PCR (RSV, Flu A&B, Covid) Anterior Nasal Swab     Status: None   Collection Time: 03/12/24 12:05 PM   Specimen:  Anterior Nasal Swab  Result Value Ref Range Status   SARS Coronavirus 2 by RT PCR NEGATIVE NEGATIVE Final   Influenza A by PCR NEGATIVE NEGATIVE Final   Influenza B by PCR NEGATIVE NEGATIVE Final    Comment: (NOTE) The Xpert Xpress SARS-CoV-2/FLU/RSV plus assay is intended as an aid in the diagnosis of influenza from Nasopharyngeal swab specimens and should not be used as a sole basis for treatment. Nasal washings and aspirates are unacceptable for Xpert Xpress SARS-CoV-2/FLU/RSV testing.  Fact Sheet for Patients: bloggercourse.com  Fact Sheet for Healthcare Providers: seriousbroker.it  This test  is not yet approved or cleared by the [place]  FDA and has been authorized for detection and/or diagnosis of SARS-CoV-2 by FDA under an Emergency Use Authorization (EUA). This EUA will remain in effect (meaning this test can be used) for the duration of the COVID-19 declaration under Section 564(b)(1) of the Act, 21 U.S.C. section 360bbb-3(b)(1), unless the authorization is terminated or revoked.     Resp Syncytial Virus by PCR NEGATIVE NEGATIVE Final    Comment: (NOTE) Fact Sheet for Patients: bloggercourse.com  Fact Sheet for Healthcare Providers: seriousbroker.it  This test is not yet approved or cleared by the [place]  FDA and has been authorized for detection and/or diagnosis of SARS-CoV-2 by FDA under an Emergency Use Authorization (EUA). This EUA will remain in effect (meaning this test can be used) for the duration of the COVID-19 declaration under Section 564(b)(1) of the Act, 21 U.S.C. section 360bbb-3(b)(1), unless the authorization is terminated or revoked.  Performed at [facility], [address], [place], [place] [address]   Culture, body fluid w Gram Stain-bottle     Status: None (Preliminary result)   Collection Time: 03/12/24 12:58 PM   Specimen: Peritoneal Washings  Result Value Ref Range Status   Specimen Description PERITONEAL  Final   Special Requests NONE  Final   Culture   Final    NO GROWTH 2 DAYS Performed at [facility], [address] [place], [place] [address]    Report Status PENDING  Incomplete  Gram stain     Status: None   Collection Time: 03/12/24 12:58 PM   Specimen: Peritoneal Washings  Result Value Ref Range Status   Specimen Description PERITONEAL  Final   Special Requests NONE  Final   Gram Stain   Final    WBC PRESENT, PREDOMINANTLY MONONUCLEAR NO ORGANISMS SEEN CYTOSPIN SMEAR Performed at [facility], [address], [place], [place] [address]     Report Status 03/12/2024 FINAL  Final    FURTHER DISCHARGE INSTRUCTIONS:  Get Medicines reviewed and adjusted: Please take all your medications with you for your next visit with your Primary MD  Laboratory/radiological data: Please request your Primary MD to go over all hospital tests and procedure/radiological results at the follow up, please ask your Primary MD to get all Hospital records sent to his/her office.  In some cases, they will be blood work, cultures and biopsy results pending at the time of your discharge. Please request that your primary care M.D. goes through all the records of your hospital data and follows up on these results.  Also Note the following: If you experience worsening of your admission symptoms, develop shortness of breath, life threatening emergency, suicidal or homicidal thoughts you must seek medical attention immediately by calling 911 or calling your MD immediately  if symptoms less severe.  You must read complete instructions/literature along with all the  possible adverse reactions/side effects for all the Medicines you take and that have been prescribed to you. Take any new Medicines after you have completely understood and accpet all the possible adverse reactions/side effects.   Do not drive when taking Pain medications or sleeping medications (Benzodaizepines)  Do not take more than prescribed Pain, Sleep and Anxiety Medications. It is not advisable to combine anxiety,sleep and pain medications without talking with your primary care practitioner  Special Instructions: If you have smoked or chewed Tobacco  in the last 2 yrs please stop smoking, stop any regular Alcohol  and or any Recreational drug use.  Wear Seat belts while driving.  Please note: You were cared for by a hospitalist during your hospital stay. Once you are discharged, your primary care physician will handle any further medical issues. Please note that NO REFILLS for any discharge  medications will be authorized once you are discharged, as it is imperative that you return to your primary care physician (or establish a relationship with a primary care physician if you do not have one) for your post hospital discharge needs so that they can reassess your need for medications and monitor your lab values.  Total Time spent coordinating discharge including counseling, education and face to face time equals greater than 30 minutes.  SignedBETHA Donalda Applebaum 03/14/2024 11:48 AM

## 2024-03-14 NOTE — Evaluation (Signed)
 Physical Therapy Evaluation Patient Details Name: Laurie Sutton MRN: [id number] DOB: 12/22/1980 Today's Date: 03/14/2024  History of Present Illness  43 yo female presents to [facility] on 12/1 from IR for paracentesis (8L removed) with concerns for sepsis. 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 Prior to admission the pt reports she was independent with all mobility without DME, able to ambulate in the home without issue, but is largely sedentary unless she is having a good day. The pt also reports she is able to manage most ADLs, and assist with IADLs at times. The pt now presents with deficits in strength, activity tolerance, endurance, dynamic stability, and processing.  Pt needing increased time for all questions and commands, but was answering accurately according to available information. Will continue to follow acutely, would currently benefit from post-acute rehab prior to return home to progress back to baseline independence.     If plan is discharge home, recommend the following: A little help with walking and/or transfers;A little help with bathing/dressing/bathroom;Assistance with cooking/housework;Assist for transportation;Help with stairs or ramp for entrance;Supervision due to cognitive status   Can travel by private vehicle        Equipment Recommendations None recommended by PT (pt has needed DME)  Recommendations for Other Services       Functional Status Assessment Patient has had a recent decline in their functional status and demonstrates the ability to make significant improvements in function in a reasonable and  predictable amount of time.     Precautions / Restrictions Precautions Precautions: Fall Restrictions Weight Bearing Restrictions Per Provider Order: No      Mobility  Bed Mobility Overal bed mobility: Modified Independent             General bed mobility comments: use of bed rails and increased time    Transfers Overall transfer level: Needs assistance Equipment used: 1 person hand held assist Transfers: Sit to/from Stand Sit to Stand: Min assist           General transfer comment: minA to rise from EOB x4 in session    Ambulation/Gait Ambulation/Gait assistance: Min assist Gait Distance (Feet): 6 Feet (+ 10 ft) Assistive device: 1 person hand held assist Gait Pattern/deviations: Step-through pattern, Decreased stride length, Shuffle, Wide base of support Gait velocity: decreased Gait velocity interpretation: <1.31 ft/sec, indicative of household ambulator   General Gait Details: pt with wide BOS and increased sway, no overt buckling but increased posterior lean and sway with fatigue. dizziness after 10 ft needing seated rest, VSS on 4L     Balance Overall balance assessment: Needs assistance Sitting-balance support: Feet supported Sitting balance-Leahy Scale: Good   Postural control: Posterior lean Standing balance support: Single extremity supported Standing balance-Leahy Scale: Poor Standing balance comment: minA to steady in standing, increased sway with fatigue                             Pertinent Vitals/Pain Pain Assessment Pain Assessment: No/denies pain Pain Intervention(s): Monitored during session    Home Living Family/patient expects to be discharged to:: Private residence Living Arrangements: Spouse/significant other Available Help at Discharge: Family;Available 24 hours/day Type of Home: House Home Access:  Stairs to enter Entrance Stairs-Rails: None Entrance Stairs-Number of Steps: 1 Alternate Level Stairs-Number of  Steps: 6&6 Home Layout: Multi-level Home Equipment: Agricultural Consultant (2 wheels);Shower seat;Wheelchair Financial Trader (4 wheels);BSC/3in1 Additional Comments: most DME is from her father    Prior Function Prior Level of Function : Independent/Modified Independent;History of Falls (last six months) (one fall in last 6 months)             Mobility Comments: no DME, single fall in last 6 months when getting out of the car. reports most of the time in bed or chair ADLs Comments: limited by poor endurance, does cook when feeling really good independent with ADL     Extremity/Trunk Assessment   Upper Extremity Assessment Upper Extremity Assessment: Generalized weakness    Lower Extremity Assessment Lower Extremity Assessment: Generalized weakness    Cervical / Trunk Assessment Cervical / Trunk Assessment: Other exceptions Cervical / Trunk Exceptions: distended abdomen  Communication   Communication Communication: No apparent difficulties    Cognition Arousal: Alert Behavior During Therapy: Flat affect   PT - Cognitive impairments: No apparent impairments                       PT - Cognition Comments: pt following commands well, able to answer questions accurately according to prior admission. slightly increased processing time, no family present Following commands: Impaired Following commands impaired: Follows one step commands with increased time     Cueing Cueing Techniques: Verbal cues     General Comments General comments (skin integrity, edema, etc.): SpO2 briefly to 86% with transition to sitting EOB, recovered to 92% on 4L, maintained >91% with all standing activity    Exercises     Assessment/Plan    PT Assessment Patient needs continued PT services  PT Problem List Decreased strength;Decreased activity tolerance;Decreased balance;Decreased mobility;Decreased coordination;Decreased safety awareness       PT Treatment Interventions DME  instruction;Stair training;Gait training;Functional mobility training;Therapeutic activities;Therapeutic exercise;Balance training;Patient/family education    PT Goals (Current goals can be found in the Care Plan section)  Acute Rehab PT Goals Patient Stated Goal: to return home PT Goal Formulation: With patient Time For Goal Achievement: 03/28/24 Potential to Achieve Goals: Good    Frequency Min 2X/week        AM-PAC PT 6 Clicks Mobility  Outcome Measure Help needed turning from your back to your side while in a flat bed without using bedrails?: A Little Help needed moving from lying on your back to sitting on the side of a flat bed without using bedrails?: A Little Help needed moving to and from a bed to a chair (including a wheelchair)?: A Little Help needed standing up from a chair using your arms (e.g., wheelchair or bedside chair)?: A Little Help needed to walk in hospital room?: Total Help needed climbing 3-5 steps with a railing? : Total 6 Click Score: 14    End of Session Equipment Utilized During Treatment: Oxygen;Gait belt Activity Tolerance: Patient limited by fatigue Patient left: in bed;with call bell/phone within reach (no recliner in room) Nurse Communication: Mobility status PT Visit Diagnosis: Muscle weakness (generalized) (M62.81)    Time: 8689-8651 PT Time Calculation (min) (ACUTE ONLY): 38 min   Charges:   PT Evaluation $PT Eval Low Complexity: 1 Low PT Treatments $Therapeutic Exercise: 8-22 mins $Therapeutic Activity: 8-22 mins PT General Charges $$ ACUTE PT VISIT: 1 Visit         Izetta Call, PT, DPT  Acute Rehabilitation Department Office [phone] Secure Chat Communication Preferred  Izetta JULIANNA Call 03/14/2024, 2:00 PM

## 2024-03-14 NOTE — Plan of Care (Signed)

## 2024-03-14 NOTE — TOC CM/SW Note (Signed)
 CM received call from 5W, bed is available at [facility], room [phone]. CM was requested to arrange hospital to hospital transport.   Call placed to [facility] at [phone]. ALS transport arranged with continuous sandostatin  gtt at 50 mcg/hr and 5L O2 via [facility]. Accepting MD is Dr. Eliezer Cherry [place]. Report number is [phone].   [facility] unable to give ETA at this time, but will contact unit when en-route. No further CM needs reported at this time. Plan for patient to transfer to alternate acute care hospital.   Merilee Batty, MSN, RN Case Management [phone]

## 2024-03-14 NOTE — Progress Notes (Signed)
 PROGRESS NOTE        PATIENT DETAILS Name: Laurie Sutton Age: 43 y.o. Sex: female Date of Birth: June 25, 1980 Admit Date: 03/12/2024 Admitting Physician Mario Tobie GAILS, MD ERE:Izqjlou, Provider, MD  Brief Summary: Patient is a 43 y.o.  female with history of NASH liver cirrhosis-chronic hepatic encephalopathy-refractory ascites requiring large-volume paracentesis-with numerous recent hospitalizations for decompensated liver cirrhosis-presented to the hospital with fever/lethargy-and subsequently admitted to the hospitalist service.  Significant events: 12/1>> paracentesis-8 L removed 12/1>> admit to [facility]-Presented with lethargy/fever-after paracentesis.  Significant studies: 12/1>> CXR: Mild streaky bibasilar atelectasis 12/1>> CT head: No acute intracranial abnormality 12/1>> CXR: Central interstitial opacities-edema versus atypical infection.  Significant microbiology data: 12/1>> COVID/influenza/RSV PCR: Negative 12/1>> blood culture: No growth 12/1>> peritoneal fluid culture: No growth.  Procedures: None  Consults: GI  Subjective: Lethargic but awake.  Mild headache but otherwise no complaints.  Objective: Vitals: Blood pressure 115/60, pulse 89, temperature 99.3 F (37.4 C), temperature source Oral, resp. rate 20, height 5' 6 (1.676 m), weight 83.5 kg, last menstrual period 11/13/2020, SpO2 90%.   Exam: Gen Exam:Alert awake-not in any distress HEENT:atraumatic, normocephalic Chest: B/L clear to auscultation anteriorly CVS:S1S2 regular Abdomen:soft non tender, mildly distended but nontense. Extremities:no edema Neurology: Non focal Skin: no rash  Pertinent Labs/Radiology:    Latest Ref Rng & Units 03/14/2024    2:39 AM 03/13/2024    3:59 AM 03/12/2024    6:43 PM  CBC  WBC 4.0 - 10.5 K/uL 3.1  2.1    Hemoglobin 12.0 - 15.0 g/dL 7.1  7.6  6.9   Hematocrit 36.0 - 46.0 % 23.1  23.6  22.0   Platelets 150 - 400 K/uL 42  50      Lab  Results  Component Value Date   NA 132 (L) 03/14/2024   K 3.3 (L) 03/14/2024   CL 97 (L) 03/14/2024   CO2 23 03/14/2024      Assessment/Plan: SIRS possibly secondary to pneumonia ?  Aspiration secondary to chronic encephalopathy All cultures negative to date No SBP on ascitic fluid analysis Discontinue vancomycin  Continue cefepime /Flagyl   Decompensated liver cirrhosis with chronic hepatic encephalopathy/refractory ascites Remains tenuous but stable-numerous recent hospitalizations for this issue Continue Xifaxan /lactulose /Aldactone /Lasix /octreotide /midodrine /IV albumin  No room for beta-blocker On transplant list-has been accepted-by transplant hepatology service [facility]-awaiting bed.  Pancytopenia Likely due to hypersplenism in the setting of liver cirrhosis Did require 1 unit of PRBC on 12/1 for drop in hemoglobin-without any evidence of bleeding (brown stools) Continue to follow CBC.  DM-2 (A1c 7.9 on 11/11) CBG stable Semglee  8 units daily+ SSI  Rest leg syndrome Requip   GERD PPI  Mood disorder Continue fluoxetine    Code status:   Code Status: Full Code   DVT Prophylaxis:SCD   Family Communication: None at bedside   Disposition Plan: Status is: Inpatient Remains inpatient appropriate because: Severity of illness   Planned Discharge Destination: Awaiting transfer to [facility]-hepatology/transplant service.   Diet: Diet Order             Diet 2 gram sodium Room service appropriate? Yes; Fluid consistency: Thin  Diet effective now                     Antimicrobial agents: Anti-infectives (From admission, onward)    Start     Dose/Rate Route Frequency Ordered Stop   03/14/24 0000  vancomycin  (VANCOCIN ) 1-5 GM/200ML-% SOLN        1,000 mg Intravenous Every 12 hours 03/13/24 1406     03/13/24 0100  vancomycin  (VANCOCIN ) IVPB 1000 mg/200 mL premix        1,000 mg 200 mL/hr over 60 Minutes Intravenous Every 12 hours  03/12/24 1517     03/13/24 0000  metroNIDAZOLE  (FLAGYL ) 500 MG/100ML        500 mg Intravenous Every 12 hours 03/13/24 1406     03/13/24 0000  ceFEPIme  2 g in sodium chloride  0.9 % 100 mL        2 g Intravenous Every 8 hours 03/13/24 1406     03/12/24 2200  metroNIDAZOLE  (FLAGYL ) IVPB 500 mg        500 mg 100 mL/hr over 60 Minutes Intravenous Every 12 hours 03/12/24 1459     03/12/24 2000  ceFEPIme  (MAXIPIME ) 2 g in sodium chloride  0.9 % 100 mL IVPB        2 g 200 mL/hr over 30 Minutes Intravenous Every 8 hours 03/12/24 1515     03/12/24 1500  rifaximin  (XIFAXAN ) tablet 550 mg        550 mg Oral 2 times daily 03/12/24 1450     03/12/24 1500  metroNIDAZOLE  (FLAGYL ) IVPB 500 mg  Status:  Discontinued        500 mg 100 mL/hr over 60 Minutes Intravenous Every 12 hours 03/12/24 1454 03/12/24 1459   03/12/24 1200  ceFEPIme  (MAXIPIME ) 2 g in sodium chloride  0.9 % 100 mL IVPB        2 g 200 mL/hr over 30 Minutes Intravenous  Once 03/12/24 1147 03/12/24 1236   03/12/24 1200  metroNIDAZOLE  (FLAGYL ) IVPB 500 mg        500 mg 100 mL/hr over 60 Minutes Intravenous  Once 03/12/24 1147 03/12/24 1306   03/12/24 1200  vancomycin  (VANCOCIN ) IVPB 1000 mg/200 mL premix  Status:  Discontinued        1,000 mg 200 mL/hr over 60 Minutes Intravenous  Once 03/12/24 1147 03/12/24 1151   03/12/24 1200  vancomycin  (VANCOREADY) IVPB 2000 mg/400 mL        2,000 mg 200 mL/hr over 120 Minutes Intravenous  Once 03/12/24 1151 03/12/24 1516        MEDICATIONS: Scheduled Meds:  sodium chloride    Intravenous Once   sodium chloride    Intravenous Once   Chlorhexidine  Gluconate Cloth  6 each Topical Daily   fentaNYL  (SUBLIMAZE ) injection  50 mcg Intravenous Once   FLUoxetine   40 mg Oral BID   insulin  aspart  0-9 Units Subcutaneous Q4H   insulin  glargine-yfgn  8 Units Subcutaneous Daily   lactulose   20 g Oral Once   lactulose   30 g Oral QID   Followed by   lactulose   30 g Oral TID   midodrine   10 mg Oral TID WC    montelukast   10 mg Oral QHS   pantoprazole  (PROTONIX ) IV  40 mg Intravenous Q12H   rifaximin   550 mg Oral BID   rOPINIRole   1 mg Oral QHS   spironolactone   25 mg Oral Daily   Continuous Infusions:  albumin  human 25 g (03/14/24 0858)   ceFEPime  (MAXIPIME ) IV 2 g (03/14/24 0409)   metronidazole  Stopped (03/13/24 2145)   octreotide  (SANDOSTATIN ) 500 mcg in sodium chloride  0.9 % 250 mL (2 mcg/mL) infusion 50 mcg/hr (03/14/24 0454)   vancomycin  Stopped (03/14/24 0311)   PRN Meds:.sodium chloride  flush   I have  personally reviewed following labs and imaging studies  LABORATORY DATA: CBC: Recent Labs  Lab 03/12/24 1135 03/12/24 1843 03/13/24 0359 03/14/24 0239  WBC 1.7*  --  2.1* 3.1*  NEUTROABS 1.4*  --  1.6*  --   HGB 8.0* 6.9* 7.6* 7.1*  HCT 25.4* 22.0* 23.6* 23.1*  MCV 88.8  --  87.4 90.2  PLT 57*  --  50* 42*    Basic Metabolic Panel: Recent Labs  Lab 03/12/24 1221 03/12/24 2124 03/13/24 0359 03/14/24 0239  NA 131*  --  131* 132*  K 3.6  --  3.5 3.3*  CL 97*  --  101 97*  CO2 21*  --  23 23  GLUCOSE 217*  --  207* 217*  BUN 13  --  10 12  CREATININE 0.90  --  0.93 0.90  CALCIUM  8.4*  --  8.2* 8.6*  MG  --  1.9 1.9  --   PHOS  --   --  4.2  --     GFR: Estimated Creatinine Clearance: 87.8 mL/min (by C-G formula based on SCr of 0.9 mg/dL).  Liver Function Tests: Recent Labs  Lab 03/12/24 1221 03/13/24 0359 03/14/24 0239  AST 46* 48* 49*  ALT 23 20 19   ALKPHOS 58 49 38  BILITOT 5.3* 9.4* 6.3*  PROT 6.0* 5.8* 5.9*  ALBUMIN  3.3* 3.2* 3.7   No results for input(s): LIPASE, AMYLASE in the last 168 hours. Recent Labs  Lab 03/12/24 1316 03/14/24 0239  AMMONIA 93* 47*    Coagulation Profile: Recent Labs  Lab 03/12/24 1221 03/13/24 0359 03/14/24 0239  INR 2.4* 2.5* 2.9*    Cardiac Enzymes: No results for input(s): CKTOTAL, CKMB, CKMBINDEX, TROPONINI in the last 168 hours.  BNP (last 3 results) No results for input(s):  PROBNP in the last 8760 hours.  Lipid Profile: No results for input(s): CHOL, HDL, LDLCALC, TRIG, CHOLHDL, LDLDIRECT in the last 72 hours.  Thyroid  Function Tests: Recent Labs    03/12/24 1221  TSH 0.178*  FREET4 1.20*    Anemia Panel: No results for input(s): VITAMINB12, FOLATE, FERRITIN, TIBC, IRON, RETICCTPCT in the last 72 hours.  Urine analysis:    Component Value Date/Time   COLORURINE AMBER (A) 03/12/2024 1500   APPEARANCEUR HAZY (A) 03/12/2024 1500   LABSPEC 1.022 03/12/2024 1500   PHURINE 5.0 03/12/2024 1500   GLUCOSEU 50 (A) 03/12/2024 1500   HGBUR MODERATE (A) 03/12/2024 1500   BILIRUBINUR NEGATIVE 03/12/2024 1500   BILIRUBINUR 1+ 02/13/2024 1151   KETONESUR NEGATIVE 03/12/2024 1500   PROTEINUR >=300 (A) 03/12/2024 1500   UROBILINOGEN 1.0 02/13/2024 1151   UROBILINOGEN 1.0 12/21/2014 1020   NITRITE NEGATIVE 03/12/2024 1500   LEUKOCYTESUR NEGATIVE 03/12/2024 1500    Sepsis Labs: Lactic Acid, Venous    Component Value Date/Time   LATICACIDVEN 1.4 03/13/2024 0359    MICROBIOLOGY: Recent Results (from the past 240 hours)  Culture, blood (Routine x 2)     Status: None (Preliminary result)   Collection Time: 03/12/24 11:35 AM   Specimen: BLOOD LEFT ARM  Result Value Ref Range Status   Specimen Description BLOOD LEFT ARM  Final   Special Requests   Final    BOTTLES DRAWN AEROBIC AND ANAEROBIC Blood Culture results may not be optimal due to an inadequate volume of blood received in culture bottles   Culture   Final    NO GROWTH 2 DAYS Performed at [facility], [address] [place], [place] [address]  Report Status PENDING  Incomplete  Culture, blood (Routine x 2)     Status: None (Preliminary result)   Collection Time: 03/12/24 11:42 AM   Specimen: BLOOD LEFT ARM  Result Value Ref Range Status   Specimen Description BLOOD LEFT ARM  Final   Special Requests   Final    BOTTLES DRAWN AEROBIC AND ANAEROBIC Blood  Culture results may not be optimal due to an inadequate volume of blood received in culture bottles   Culture   Final    NO GROWTH 2 DAYS Performed at [facility], [address] [place], [place] [address]    Report Status PENDING  Incomplete  Resp panel by RT-PCR (RSV, Flu A&B, Covid) Anterior Nasal Swab     Status: None   Collection Time: 03/12/24 12:05 PM   Specimen: Anterior Nasal Swab  Result Value Ref Range Status   SARS Coronavirus 2 by RT PCR NEGATIVE NEGATIVE Final   Influenza A by PCR NEGATIVE NEGATIVE Final   Influenza B by PCR NEGATIVE NEGATIVE Final    Comment: (NOTE) The Xpert Xpress SARS-CoV-2/FLU/RSV plus assay is intended as an aid in the diagnosis of influenza from Nasopharyngeal swab specimens and should not be used as a sole basis for treatment. Nasal washings and aspirates are unacceptable for Xpert Xpress SARS-CoV-2/FLU/RSV testing.  Fact Sheet for Patients: bloggercourse.com  Fact Sheet for Healthcare Providers: seriousbroker.it  This test is not yet approved or cleared by the [place]  FDA and has been authorized for detection and/or diagnosis of SARS-CoV-2 by FDA under an Emergency Use Authorization (EUA). This EUA will remain in effect (meaning this test can be used) for the duration of the COVID-19 declaration under Section 564(b)(1) of the Act, 21 U.S.C. section 360bbb-3(b)(1), unless the authorization is terminated or revoked.     Resp Syncytial Virus by PCR NEGATIVE NEGATIVE Final    Comment: (NOTE) Fact Sheet for Patients: bloggercourse.com  Fact Sheet for Healthcare Providers: seriousbroker.it  This test is not yet approved or cleared by the [place]  FDA and has been authorized for detection and/or diagnosis of SARS-CoV-2 by FDA under an Emergency Use Authorization (EUA). This EUA will remain in effect (meaning this test can  be used) for the duration of the COVID-19 declaration under Section 564(b)(1) of the Act, 21 U.S.C. section 360bbb-3(b)(1), unless the authorization is terminated or revoked.  Performed at [facility], [address] [place], [place] [address]   Culture, body fluid w Gram Stain-bottle     Status: None (Preliminary result)   Collection Time: 03/12/24 12:58 PM   Specimen: Peritoneal Washings  Result Value Ref Range Status   Specimen Description PERITONEAL  Final   Special Requests NONE  Final   Culture   Final    NO GROWTH 2 DAYS Performed at [facility], [address], [place], [place] [address]    Report Status PENDING  Incomplete  Gram stain     Status: None   Collection Time: 03/12/24 12:58 PM   Specimen: Peritoneal Washings  Result Value Ref Range Status   Specimen Description PERITONEAL  Final   Special Requests NONE  Final   Gram Stain   Final    WBC PRESENT, PREDOMINANTLY MONONUCLEAR NO ORGANISMS SEEN CYTOSPIN SMEAR Performed at [facility], [address] [place], [place] [address]    Report Status 03/12/2024 FINAL  Final    RADIOLOGY STUDIES/RESULTS: DG Chest Port 1 View Result Date: 03/12/2024 CLINICAL DATA:  Shortness of breath EXAM: PORTABLE CHEST 1 VIEW COMPARISON:  Chest x-ray 03/12/2024 FINDINGS: There central interstitial opacities bilaterally. There is no pleural effusion or pneumothorax. Cardiomediastinal silhouette is within normal limits. No acute fractures are seen. IMPRESSION: Central interstitial opacities bilaterally, which may represent pulmonary edema or atypical infection. Electronically Signed   By: Greig Pique M.D.   On: 03/12/2024 23:27   US  EKG SITE RITE Result Date: 03/12/2024 If Site Rite image not attached, placement could not be confirmed due to current cardiac rhythm.  CT Head Wo Contrast Result Date: 03/12/2024 CLINICAL DATA:  Altered mental status EXAM: CT HEAD WITHOUT CONTRAST TECHNIQUE: Contiguous axial images  were obtained from the base of the skull through the vertex without intravenous contrast. RADIATION DOSE REDUCTION: This exam was performed according to the departmental dose-optimization program which includes automated exposure control, adjustment of the mA and/or kV according to patient size and/or use of iterative reconstruction technique. COMPARISON:  10/21/2011 FINDINGS: Brain: No evidence of acute infarction, hemorrhage, hydrocephalus, extra-axial collection or mass lesion/mass effect. Vascular: No hyperdense vessel or unexpected calcification. Skull: Normal. Negative for fracture or focal lesion. Sinuses/Orbits: No acute finding. Other: None. IMPRESSION: No acute intracranial abnormality by noncontrast CT. Electronically Signed   By: CHRISTELLA.  Shick M.D.   On: 03/12/2024 16:39   DG Chest Port 1 View if patient is in a treatment room. Result Date: 03/12/2024 CLINICAL DATA:  Possible sepsis. EXAM: PORTABLE CHEST 1 VIEW COMPARISON:  02/26/2024.  CT chest 10/04/2023. FINDINGS: Trachea is midline. Heart is enlarged. Lungs are somewhat low in volume with minimal streaky atelectasis in the lung bases. No airspace consolidation or pleural fluid. IMPRESSION: Low lung volumes with mild streaky bibasilar atelectasis. Electronically Signed   By: Newell Eke M.D.   On: 03/12/2024 14:01   IR Paracentesis Result Date: 03/12/2024 INDICATION: 43 year old female. History of cirrhosis with recurrent ascites. Request is for therapeutic paracentesis. 8 L maximum EXAM: ULTRASOUND GUIDED THERAPEUTIC LEFT-SIDED PARACENTESIS MEDICATIONS: Lidocaine  1% 10 mL COMPLICATIONS: None immediate. PROCEDURE: Informed written consent was obtained from the patient after a discussion of the risks, benefits and alternatives to treatment. A timeout was performed prior to the initiation of the procedure. Initial ultrasound scanning demonstrates a large amount of ascites within the left lower abdominal quadrant. The left lower abdomen was prepped  and draped in the usual sterile fashion. 1% lidocaine  was used for local anesthesia. Following this, a 19 gauge, 7-cm, Yueh catheter was introduced. An ultrasound image was saved for documentation purposes. The paracentesis was performed. The catheter was removed and a dressing was applied. The patient tolerated the procedure well without immediate post procedural complication. Patient received post-procedure intravenous albumin ; see nursing notes for details. FINDINGS: A total of approximately 8 L of straw-colored fluid was removed. Diagnostic samples held in IR she team a request fluid sampling. IMPRESSION: Successful ultrasound-guided paracentesis yielding 8 liters of peritoneal fluid. Performed by Delon Beagle NP PLAN: Patient currently being worked up for possible [facility] [place]. Electronically Signed   By: Juliene Balder M.D.   On: 03/12/2024 11:48     LOS: 2 days   Donalda Applebaum, MD  [facility]    To contact the attending provider between 7A-7P or the covering provider during after hours 7P-7A, please log into the web site [email or web address] and access using universal [place] password for that web site. If you do not have the password, please call the hospital operator.  03/14/2024, 9:35 AM

## 2024-03-15 LAB — RESPIRATORY PANEL BY PCR

## 2024-03-16 LAB — TYPE AND SCREEN
ABO/RH(D): O POS
Antibody Screen: NEGATIVE
Unit division: 0
Unit division: 0
Unit division: 0

## 2024-03-16 LAB — BPAM RBC
Blood Product Expiration Date: 202512072359
Blood Product Expiration Date: 202512292359
ISSUE DATE / TIME: 202512012136
ISSUE DATE / TIME: 202512040706
ISSUE DATE / TIME: 202512292359
Unit Type and Rh: 202512072359
Unit Type and Rh: 202512292359
Unit Type and Rh: 5100
Unit Type and Rh: 5100
Unit Type and Rh: 5100

## 2024-03-17 LAB — CULTURE, BLOOD (ROUTINE X 2)
Culture: NO GROWTH
Culture: NO GROWTH

## 2024-03-17 LAB — CULTURE, BODY FLUID W GRAM STAIN -BOTTLE: Culture: NO GROWTH

## 2024-04-19 NOTE — Progress Notes (Signed)
" °   04/19/24 1815  Vital Signs  Temp 98.2 F (36.8 C)  Heart Rate (!) 127  Resp (!) 25  BP (!) 100/56  MAP (mmHg) 70  SpO2 100 %  Post-Hemodialysis Assessment  End Time 1802  Actual Treatment Time (minutes) 300 minutes  Minutes Short From Prescription 0  EPO Given No  Blood Transfusion Given No  Weight 79.1 kg (174 lb 6.1 oz)  Weight Obtained By Bed scale  Patient Position for Weight Lying  Weight Change % -3.18  Hemodialysis Output (Volume Removed) mL 2600 mL  Crit-Line Used No  Hemodialysis Handoff Taylor,RN  Post-Hemodialysis Comments patient blood pressure dropped at end of  dialysis given 150 NSS at end of treatment  Pain Assessment  Pain Assessment No/denies pain    "
